# Patient Record
Sex: Female | Born: 1955 | Race: White | Hispanic: No | Smoking: Current every day smoker
Health system: Southern US, Community
[De-identification: ages and names within clinical notes are randomized; demographics above are authoritative.]

## PROBLEM LIST (undated history)

## (undated) DIAGNOSIS — F419 Anxiety disorder, unspecified: Secondary | ICD-10-CM

## (undated) DIAGNOSIS — IMO0001 Reserved for inherently not codable concepts without codable children: Secondary | ICD-10-CM

## (undated) DIAGNOSIS — M1990 Unspecified osteoarthritis, unspecified site: Secondary | ICD-10-CM

## (undated) DIAGNOSIS — R519 Headache, unspecified: Secondary | ICD-10-CM

## (undated) DIAGNOSIS — E785 Hyperlipidemia, unspecified: Secondary | ICD-10-CM

## (undated) DIAGNOSIS — R51 Headache: Secondary | ICD-10-CM

## (undated) DIAGNOSIS — G8929 Other chronic pain: Secondary | ICD-10-CM

## (undated) DIAGNOSIS — F329 Major depressive disorder, single episode, unspecified: Secondary | ICD-10-CM

## (undated) DIAGNOSIS — M797 Fibromyalgia: Secondary | ICD-10-CM

## (undated) DIAGNOSIS — Z9981 Dependence on supplemental oxygen: Secondary | ICD-10-CM

## (undated) DIAGNOSIS — J449 Chronic obstructive pulmonary disease, unspecified: Secondary | ICD-10-CM

## (undated) DIAGNOSIS — F410 Panic disorder [episodic paroxysmal anxiety] without agoraphobia: Secondary | ICD-10-CM

## (undated) DIAGNOSIS — K219 Gastro-esophageal reflux disease without esophagitis: Secondary | ICD-10-CM

## (undated) DIAGNOSIS — F32A Depression, unspecified: Secondary | ICD-10-CM

## (undated) DIAGNOSIS — G479 Sleep disorder, unspecified: Secondary | ICD-10-CM

## (undated) DIAGNOSIS — R600 Localized edema: Secondary | ICD-10-CM

## (undated) HISTORY — PX: ABDOMINAL SURGERY: SHX537

## (undated) HISTORY — PX: KNEE SURGERY: SHX244

## (undated) HISTORY — DX: Chronic obstructive pulmonary disease, unspecified: J44.9

## (undated) HISTORY — DX: Gastro-esophageal reflux disease without esophagitis: K21.9

## (undated) HISTORY — DX: Hyperlipidemia, unspecified: E78.5

## (undated) HISTORY — PX: TONSILLECTOMY: SUR1361

## (undated) HISTORY — PX: BACK SURGERY: SHX140

## (undated) HISTORY — PX: ANKLE SURGERY: SHX546

## (undated) HISTORY — PX: TUBAL LIGATION: SHX77

## (undated) HISTORY — PX: CARPAL TUNNEL RELEASE: SHX101

---

## 2004-08-28 ENCOUNTER — Emergency Department: Payer: Self-pay | Admitting: Emergency Medicine

## 2004-09-13 ENCOUNTER — Emergency Department: Payer: Self-pay | Admitting: Unknown Physician Specialty

## 2004-09-23 ENCOUNTER — Emergency Department: Payer: Self-pay | Admitting: Emergency Medicine

## 2005-11-10 ENCOUNTER — Other Ambulatory Visit: Payer: Self-pay

## 2005-11-16 ENCOUNTER — Ambulatory Visit: Payer: Self-pay | Admitting: Specialist

## 2005-12-10 ENCOUNTER — Ambulatory Visit: Payer: Self-pay | Admitting: Specialist

## 2006-09-14 ENCOUNTER — Ambulatory Visit: Payer: Self-pay | Admitting: Pain Medicine

## 2006-09-22 ENCOUNTER — Ambulatory Visit: Payer: Self-pay | Admitting: Pain Medicine

## 2006-10-07 ENCOUNTER — Emergency Department: Payer: Self-pay | Admitting: Emergency Medicine

## 2006-10-25 ENCOUNTER — Ambulatory Visit: Payer: Self-pay | Admitting: Family Medicine

## 2006-10-31 ENCOUNTER — Ambulatory Visit: Payer: Self-pay | Admitting: Pain Medicine

## 2006-11-10 ENCOUNTER — Ambulatory Visit: Payer: Self-pay | Admitting: Pain Medicine

## 2006-12-12 ENCOUNTER — Ambulatory Visit: Payer: Self-pay | Admitting: Physician Assistant

## 2007-01-02 ENCOUNTER — Ambulatory Visit: Payer: Self-pay | Admitting: Pain Medicine

## 2007-01-31 ENCOUNTER — Other Ambulatory Visit: Payer: Self-pay

## 2007-01-31 ENCOUNTER — Emergency Department: Payer: Self-pay | Admitting: Emergency Medicine

## 2007-06-15 ENCOUNTER — Emergency Department: Payer: Self-pay | Admitting: Emergency Medicine

## 2007-09-22 ENCOUNTER — Emergency Department: Payer: Self-pay | Admitting: Emergency Medicine

## 2008-01-27 ENCOUNTER — Emergency Department: Payer: Self-pay | Admitting: Emergency Medicine

## 2008-02-13 ENCOUNTER — Emergency Department: Payer: Self-pay | Admitting: Emergency Medicine

## 2009-01-25 ENCOUNTER — Emergency Department: Payer: Self-pay | Admitting: Emergency Medicine

## 2009-03-10 ENCOUNTER — Emergency Department (HOSPITAL_COMMUNITY): Admission: EM | Admit: 2009-03-10 | Discharge: 2009-03-10 | Payer: Self-pay | Admitting: Emergency Medicine

## 2009-03-10 ENCOUNTER — Ambulatory Visit: Payer: Self-pay | Admitting: *Deleted

## 2009-03-10 ENCOUNTER — Encounter (INDEPENDENT_AMBULATORY_CARE_PROVIDER_SITE_OTHER): Payer: Self-pay | Admitting: Emergency Medicine

## 2010-05-07 ENCOUNTER — Ambulatory Visit (HOSPITAL_COMMUNITY): Admission: RE | Admit: 2010-05-07 | Discharge: 2010-05-07 | Payer: Self-pay | Admitting: Family Medicine

## 2010-07-25 ENCOUNTER — Inpatient Hospital Stay (HOSPITAL_COMMUNITY)
Admission: EM | Admit: 2010-07-25 | Discharge: 2010-07-28 | Payer: Self-pay | Source: Home / Self Care | Admitting: Emergency Medicine

## 2010-07-25 ENCOUNTER — Ambulatory Visit: Payer: Self-pay | Admitting: Cardiology

## 2010-07-27 ENCOUNTER — Encounter (INDEPENDENT_AMBULATORY_CARE_PROVIDER_SITE_OTHER): Payer: Self-pay | Admitting: Internal Medicine

## 2010-11-10 LAB — HEPATIC FUNCTION PANEL
ALT: 17 U/L (ref 0–35)
AST: 20 U/L (ref 0–37)
Bilirubin, Direct: 0 mg/dL (ref 0.0–0.3)
Indirect Bilirubin: 0.4 mg/dL (ref 0.3–0.9)
Total Bilirubin: 0.4 mg/dL (ref 0.3–1.2)

## 2010-11-10 LAB — BLOOD GAS, ARTERIAL
Bicarbonate: 28.1 mEq/L — ABNORMAL HIGH (ref 20.0–24.0)
Bicarbonate: 28.4 mEq/L — ABNORMAL HIGH (ref 20.0–24.0)
O2 Saturation: 98.5 %
Patient temperature: 37
pH, Arterial: 7.326 — ABNORMAL LOW (ref 7.350–7.400)
pH, Arterial: 7.34 — ABNORMAL LOW (ref 7.350–7.400)

## 2010-11-10 LAB — GLUCOSE, CAPILLARY
Glucose-Capillary: 105 mg/dL — ABNORMAL HIGH (ref 70–99)
Glucose-Capillary: 122 mg/dL — ABNORMAL HIGH (ref 70–99)

## 2010-11-10 LAB — CARDIAC PANEL(CRET KIN+CKTOT+MB+TROPI)
CK, MB: 3.4 ng/mL (ref 0.3–4.0)
Troponin I: 0.01 ng/mL (ref 0.00–0.06)

## 2010-11-10 LAB — BASIC METABOLIC PANEL
BUN: 10 mg/dL (ref 6–23)
BUN: 8 mg/dL (ref 6–23)
CO2: 27 mEq/L (ref 19–32)
CO2: 33 mEq/L — ABNORMAL HIGH (ref 19–32)
Calcium: 8.7 mg/dL (ref 8.4–10.5)
Chloride: 99 mEq/L (ref 96–112)
Creatinine, Ser: 0.79 mg/dL (ref 0.4–1.2)
Creatinine, Ser: 0.8 mg/dL (ref 0.4–1.2)
GFR calc non Af Amer: >60 mL/min (ref >60)
Glucose, Bld: 132 mg/dL — ABNORMAL HIGH (ref 70–99)
Glucose, Bld: 161 mg/dL — ABNORMAL HIGH (ref 70–99)
Sodium: 143 mEq/L (ref 135–145)

## 2010-11-10 LAB — URINE CULTURE: Culture  Setup Time: 201111272046

## 2010-11-10 LAB — POCT CARDIAC MARKERS
CKMB, poc: <1 ng/mL — ABNORMAL LOW (ref 1.0–8.0)
Troponin i, poc: <0.05 ng/mL (ref 0.00–0.09)

## 2010-11-10 LAB — CBC
HCT: 45.5 % (ref 36.0–46.0)
Hemoglobin: 14.6 g/dL (ref 12.0–15.0)
Hemoglobin: 15.2 g/dL — ABNORMAL HIGH (ref 12.0–15.0)
MCH: 29.8 pg (ref 26.0–34.0)
MCHC: 32.8 g/dL (ref 30.0–36.0)
MCV: 91.5 fL (ref 78.0–100.0)
Platelets: 242 10*3/uL (ref 150–400)
Platelets: 245 10*3/uL (ref 150–400)
RBC: 4.97 MIL/uL (ref 3.87–5.11)
RDW: 14.7 % (ref 11.5–15.5)
WBC: 11.5 10*3/uL — ABNORMAL HIGH (ref 4.0–10.5)

## 2010-11-10 LAB — URINALYSIS, ROUTINE W REFLEX MICROSCOPIC
Hgb urine dipstick: NEGATIVE
Nitrite: NEGATIVE
Specific Gravity, Urine: 1.025 (ref 1.005–1.030)
Urobilinogen, UA: 0.2 mg/dL (ref 0.0–1.0)
pH: 6 (ref 5.0–8.0)

## 2010-11-10 LAB — DIFFERENTIAL
Basophils Absolute: 0 10*3/uL (ref 0.0–0.1)
Basophils Relative: 0 % (ref 0–1)
Eosinophils Absolute: 0 10*3/uL (ref 0.0–0.7)
Eosinophils Relative: 0 % (ref 0–5)
Lymphocytes Relative: 6 % — ABNORMAL LOW (ref 12–46)
Lymphs Abs: 0.7 10*3/uL (ref 0.7–4.0)
Monocytes Relative: 1 % — ABNORMAL LOW (ref 3–12)
Monocytes Relative: 5 % (ref 3–12)
Neutro Abs: 9.1 10*3/uL — ABNORMAL HIGH (ref 1.7–7.7)
Neutrophils Relative %: 81 % — ABNORMAL HIGH (ref 43–77)

## 2010-11-10 LAB — HEMOGLOBIN A1C: Hgb A1c MFr Bld: 5.8 % — ABNORMAL HIGH (ref <5.7)

## 2010-12-06 LAB — DIFFERENTIAL
Basophils Relative: 1 % (ref 0–1)
Eosinophils Absolute: 0.2 10*3/uL (ref 0.0–0.7)
Eosinophils Relative: 2 % (ref 0–5)
Lymphs Abs: 2.3 10*3/uL (ref 0.7–4.0)
Neutrophils Relative %: 65 % (ref 43–77)

## 2010-12-06 LAB — BLOOD GAS, ARTERIAL
Acid-Base Excess: 7.5 mmol/L — ABNORMAL HIGH (ref 0.0–2.0)
Bicarbonate: 33.6 mEq/L — ABNORMAL HIGH (ref 20.0–24.0)
O2 Saturation: 96.8 %
Patient temperature: 98.6
TCO2: 30 mmol/L (ref 0–100)
pH, Arterial: 7.399 (ref 7.350–7.400)

## 2010-12-06 LAB — BASIC METABOLIC PANEL
BUN: 8 mg/dL (ref 6–23)
CO2: 34 mEq/L — ABNORMAL HIGH (ref 19–32)
Chloride: 100 mEq/L (ref 96–112)
Creatinine, Ser: 0.76 mg/dL (ref 0.4–1.2)
Glucose, Bld: 92 mg/dL (ref 70–99)
Potassium: 3.7 mEq/L (ref 3.5–5.1)

## 2010-12-06 LAB — CBC
HCT: 40.7 % (ref 36.0–46.0)
MCHC: 32.1 g/dL (ref 30.0–36.0)
MCV: 82.6 fL (ref 78.0–100.0)
Platelets: 279 10*3/uL (ref 150–400)
RDW: 16.7 % — ABNORMAL HIGH (ref 11.5–15.5)
WBC: 9.2 10*3/uL (ref 4.0–10.5)

## 2010-12-06 LAB — D-DIMER, QUANTITATIVE: D-Dimer, Quant: 0.94 ug/mL-FEU — ABNORMAL HIGH (ref 0.00–0.48)

## 2010-12-06 LAB — PROTIME-INR: Prothrombin Time: 12.8 seconds (ref 11.6–15.2)

## 2010-12-18 ENCOUNTER — Encounter (INDEPENDENT_AMBULATORY_CARE_PROVIDER_SITE_OTHER): Payer: Self-pay

## 2010-12-18 DIAGNOSIS — R0989 Other specified symptoms and signs involving the circulatory and respiratory systems: Secondary | ICD-10-CM

## 2011-04-13 ENCOUNTER — Institutional Professional Consult (permissible substitution): Payer: Self-pay | Admitting: Internal Medicine

## 2011-05-03 ENCOUNTER — Emergency Department (HOSPITAL_COMMUNITY)
Admission: EM | Admit: 2011-05-03 | Discharge: 2011-05-03 | Discharge status: Against Medical Advice | Payer: Medicare Other | Attending: Emergency Medicine | Admitting: Emergency Medicine

## 2011-05-03 DIAGNOSIS — Z5320 Procedure and treatment not carried out because of patient's decision for unspecified reasons: Secondary | ICD-10-CM | POA: Insufficient documentation

## 2011-05-03 DIAGNOSIS — M7989 Other specified soft tissue disorders: Secondary | ICD-10-CM | POA: Insufficient documentation

## 2011-05-03 HISTORY — DX: Other chronic pain: G89.29

## 2011-05-03 HISTORY — DX: Panic disorder (episodic paroxysmal anxiety): F41.0

## 2011-05-03 HISTORY — DX: Sleep disorder, unspecified: G47.9

## 2011-05-03 HISTORY — DX: Fibromyalgia: M79.7

## 2011-05-03 HISTORY — DX: Major depressive disorder, single episode, unspecified: F32.9

## 2011-05-03 HISTORY — DX: Anxiety disorder, unspecified: F41.9

## 2011-05-03 HISTORY — DX: Depression, unspecified: F32.A

## 2011-05-03 HISTORY — DX: Unspecified osteoarthritis, unspecified site: M19.90

## 2011-05-03 MED ORDER — ALBUTEROL (5 MG/ML) CONTINUOUS INHALATION SOLN: 5.0000 mg/h | INHALATION_SOLUTION | Freq: Once | RESPIRATORY_TRACT | Status: DC

## 2011-05-03 MED ORDER — KETOROLAC TROMETHAMINE 60 MG/2ML IM SOLN: 60.0000 mg | Freq: Once | INTRAMUSCULAR | Status: DC

## 2011-05-03 MED ORDER — IPRATROPIUM BROMIDE 0.02 % IN SOLN: 0.5000 mg | Freq: Once | RESPIRATORY_TRACT | Status: DC

## 2011-05-03 MED ORDER — PREDNISONE 20 MG PO TABS: 40.0000 mg | ORAL_TABLET | Freq: Once | ORAL | Status: DC

## 2011-05-03 NOTE — ED Notes (Signed)
Witnessed drinking soda in lobby and was on sidewalk smoking.

## 2011-05-03 NOTE — ED Notes (Signed)
Pt reports bil lower leg swelling x2 days

## 2011-05-03 NOTE — ED Provider Notes (Addendum)
Scribed for Dr. Lynelle Doctor, the patient was seen in room 03. This chart was scribed by Hillery Hunter. This patient's care was started at 21:42.   History    CSN: [id number] Arrival date & time: 05/03/2011  8:51 PM  Chief Complaint  Patient presents with  . Leg Swelling    x 2 days   The history is provided by the patient.    Bethany Villa is a 55 y.o. female who presents to the Emergency Department complaining of lower leg swelling over the last two days without improvement. She denies chest pain but has some shortness of breath which she states is worse over the last couple days just present when she walks. She has an inhaler at home for when she is wheezing but this has not been improving dyspnea. She has had some cough with white sputum. She denies fevers. She smokes two packs per day currently. PT relates she has chronic swelling of her left leg for years after ankle fx and knee surgery for torn cartilege.  She reports a history of fibromyalgia, asthma, denies DM, HTN. She is followed by Dr. Tania Ade in [facility]. She reports the last time she needed Prednisone for asthma was about six months ago.  She reports her current home medications: Albuterol, Singulair, Flexeril, Valium, Voltaren, Cymbalta, Lortab, Lyrica, Spiriva.  Past Medical History  Diagnosis Date  . Fibromyalgia   . Asthma   . DJD (degenerative joint disease)   . Chronic pain   . Depression   . Anxiety   . Panic attack   . Sleep disorder     Past Surgical History  Procedure Date  . Abdominal surgery   . Back surgery   . Carpal tunnel release   . Ankle surgery   . Knee surgery   . Cesarean section   . Tonsillectomy   . Tubal ligation     No family history on file.  History  Substance Use Topics  . Smoking status: Current Everyday Smoker    Types: Cigarettes  . Smokeless tobacco: Not on file  . Alcohol Use: Yes     occ  smokes 2ppd, on disability  Review of Systems    Constitutional: Negative for fever.  Respiratory: Positive for cough and shortness of breath.   Gastrointestinal: Negative for nausea, vomiting, abdominal pain and diarrhea.  Neurological: Negative for speech difficulty, weakness, light-headedness and numbness.  Psychiatric/Behavioral: Negative for confusion.  All other systems reviewed and are negative.    Physical Exam  BP 126/80  Pulse 106  Temp(Src) 97.8 F (36.6 C) (Oral)  Resp 22  Ht 5\' 4"  (1.626 m)  Wt 200 lb (90.719 kg)  BMI 34.33 kg/m2  SpO2 94%  Physical Exam  Nursing note and vitals reviewed. Constitutional: She is oriented to person, place, and time. She appears well-developed and well-nourished.       obese  HENT:  Head: Normocephalic and atraumatic.  Eyes: EOM are normal. Pupils are equal, round, and reactive to light.  Neck: Normal range of motion. Neck supple.  Cardiovascular: Normal rate, regular rhythm and normal heart sounds.   Pulmonary/Chest: She has wheezes.       Very diminished breath sounds  Musculoskeletal: Normal range of motion. She exhibits no tenderness.  Neurological: She is alert and oriented to person, place, and time.  Skin: Skin is warm and dry. There is erythema (chronic red changes of the dorsum of her left foot at the base of her toes c/w chronic  venous stasis).  Psychiatric: She has a normal mood and affect. Her behavior is normal.  No pitting edema of her LE, no obvious swelling of her RLE, has mild swelling nonpitting of her LLE with diffuse pain both anterior and posteriorly with some redness of the skin of her distal foot that she states is old. Her leg/foot is not warm to touch.   ED Course  Procedures  OTHER DATA REVIEWED: Nursing notes, vital signs, and past medical records reviewed.  DIAGNOSTIC STUDIES: Oxygen Saturation is 94% on room air, adequate for patient by my interpretation.    ED COURSE / COORDINATION OF CARE: 21:54. Initial orders: CBC, D-dimer quant, BNP, BMP,  CXR. 22:00. Ordered Toradol 60mg , Prednisone 40mg , Albuterol / Atrovent nebulizer. 22:07. Patient decided to leave AMA without blood taken or medication administered.   MDM: PLAN:  Patient left AMA  I personally performed the services described in this documentation, which was scribed in my presence. The recorded information has been reviewed and considered.Devoria Albe, MD, FACEP  Ward Givens, MD 05/03/11 [id number]  Ward Givens, MD 05/03/11 2216

## 2011-05-19 ENCOUNTER — Emergency Department (HOSPITAL_COMMUNITY): Payer: Medicare Other

## 2011-05-19 ENCOUNTER — Inpatient Hospital Stay (HOSPITAL_COMMUNITY)
Admission: EM | Admit: 2011-05-19 | Discharge: 2011-05-23 | DRG: 189 | Disposition: A | Discharge status: Home / Self Care | Payer: Medicare Other | Attending: Internal Medicine | Admitting: Internal Medicine

## 2011-05-19 ENCOUNTER — Encounter (HOSPITAL_COMMUNITY): Payer: Self-pay | Admitting: Emergency Medicine

## 2011-05-19 DIAGNOSIS — G894 Chronic pain syndrome: Secondary | ICD-10-CM | POA: Diagnosis present

## 2011-05-19 DIAGNOSIS — G8929 Other chronic pain: Secondary | ICD-10-CM | POA: Diagnosis present

## 2011-05-19 DIAGNOSIS — J9602 Acute respiratory failure with hypercapnia: Secondary | ICD-10-CM | POA: Diagnosis present

## 2011-05-19 DIAGNOSIS — IMO0001 Reserved for inherently not codable concepts without codable children: Secondary | ICD-10-CM | POA: Diagnosis present

## 2011-05-19 DIAGNOSIS — E872 Acidosis, unspecified: Secondary | ICD-10-CM | POA: Diagnosis present

## 2011-05-19 DIAGNOSIS — E669 Obesity, unspecified: Secondary | ICD-10-CM | POA: Diagnosis present

## 2011-05-19 DIAGNOSIS — J45901 Unspecified asthma with (acute) exacerbation: Secondary | ICD-10-CM | POA: Diagnosis present

## 2011-05-19 DIAGNOSIS — F3289 Other specified depressive episodes: Secondary | ICD-10-CM | POA: Diagnosis present

## 2011-05-19 DIAGNOSIS — F329 Major depressive disorder, single episode, unspecified: Secondary | ICD-10-CM | POA: Diagnosis present

## 2011-05-19 DIAGNOSIS — F411 Generalized anxiety disorder: Secondary | ICD-10-CM | POA: Diagnosis present

## 2011-05-19 DIAGNOSIS — J9600 Acute respiratory failure, unspecified whether with hypoxia or hypercapnia: Principal | ICD-10-CM | POA: Diagnosis present

## 2011-05-19 DIAGNOSIS — R739 Hyperglycemia, unspecified: Secondary | ICD-10-CM | POA: Diagnosis present

## 2011-05-19 DIAGNOSIS — J441 Chronic obstructive pulmonary disease with (acute) exacerbation: Secondary | ICD-10-CM | POA: Diagnosis present

## 2011-05-19 DIAGNOSIS — F17200 Nicotine dependence, unspecified, uncomplicated: Secondary | ICD-10-CM | POA: Diagnosis present

## 2011-05-19 LAB — CBC
HCT: 49.9 % — ABNORMAL HIGH (ref 36.0–46.0)
Hemoglobin: 15.6 g/dL — ABNORMAL HIGH (ref 12.0–15.0)
MCH: 28.6 pg (ref 26.0–34.0)
MCV: 91.6 fL (ref 78.0–100.0)
RBC: 5.45 MIL/uL — ABNORMAL HIGH (ref 3.87–5.11)

## 2011-05-19 LAB — HEPATIC FUNCTION PANEL
ALT: 9 U/L (ref 0–35)
AST: 13 U/L (ref 0–37)
Bilirubin, Direct: 0.1 mg/dL (ref 0.0–0.3)
Total Bilirubin: 0.2 mg/dL — ABNORMAL LOW (ref 0.3–1.2)
Total Protein: 6.8 g/dL (ref 6.0–8.3)

## 2011-05-19 LAB — BLOOD GAS, ARTERIAL
Bicarbonate: 32.6 mEq/L — ABNORMAL HIGH (ref 20.0–24.0)
O2 Saturation: 88 %
pO2, Arterial: 57.2 mmHg — ABNORMAL LOW (ref 80.0–100.0)

## 2011-05-19 LAB — BASIC METABOLIC PANEL
CO2: 35 mEq/L — ABNORMAL HIGH (ref 19–32)
Calcium: 9 mg/dL (ref 8.4–10.5)
Glucose, Bld: 159 mg/dL — ABNORMAL HIGH (ref 70–99)
Sodium: 139 mEq/L (ref 135–145)

## 2011-05-19 MED ORDER — NICOTINE 21 MG/24HR TD PT24
21.0000 mg | MEDICATED_PATCH | Freq: Every day | TRANSDERMAL | Status: DC
  Administered 2011-05-19 – 2011-05-23 (×5): 21 mg via TRANSDERMAL
  Filled 2011-05-19 (×5): qty 1

## 2011-05-19 MED ORDER — OXYCODONE HCL 5 MG PO TABS
5.0000 mg | ORAL_TABLET | ORAL | Status: DC | PRN
  Administered 2011-05-20 – 2011-05-22 (×5): 5 mg via ORAL
  Filled 2011-05-19 (×5): qty 1

## 2011-05-19 MED ORDER — IPRATROPIUM BROMIDE 0.02 % IN SOLN
0.5000 mg | Freq: Once | RESPIRATORY_TRACT | Status: AC
  Administered 2011-05-19: 0.5 mg via RESPIRATORY_TRACT
  Filled 2011-05-19 (×2): qty 2.5

## 2011-05-19 MED ORDER — HYDROMORPHONE HCL 1 MG/ML IJ SOLN
0.5000 mg | INTRAMUSCULAR | Status: DC | PRN
  Administered 2011-05-19 – 2011-05-22 (×8): 0.5 mg via INTRAVENOUS
  Filled 2011-05-19 (×10): qty 1

## 2011-05-19 MED ORDER — ENOXAPARIN SODIUM 40 MG/0.4ML ~~LOC~~ SOLN
40.0000 mg | SUBCUTANEOUS | Status: DC
  Administered 2011-05-20 – 2011-05-23 (×4): 40 mg via SUBCUTANEOUS
  Filled 2011-05-19 (×4): qty 0.4

## 2011-05-19 MED ORDER — POLYETHYLENE GLYCOL 3350 17 G PO PACK
17.0000 g | PACK | Freq: Every day | ORAL | Status: DC | PRN
  Administered 2011-05-20: 17 g via ORAL
  Filled 2011-05-19: qty 1

## 2011-05-19 MED ORDER — IPRATROPIUM BROMIDE 0.02 % IN SOLN
0.5000 mg | RESPIRATORY_TRACT | Status: DC | PRN
  Filled 2011-05-19: qty 2.5

## 2011-05-19 MED ORDER — ACETAMINOPHEN 650 MG RE SUPP: 650.0000 mg | Freq: Four times a day (QID) | RECTAL | Status: DC | PRN

## 2011-05-19 MED ORDER — PREGABALIN 75 MG PO CAPS
150.0000 mg | ORAL_CAPSULE | Freq: Two times a day (BID) | ORAL | Status: DC
  Administered 2011-05-19 – 2011-05-23 (×8): 150 mg via ORAL
  Filled 2011-05-19 (×2): qty 2
  Filled 2011-05-19: qty 1
  Filled 2011-05-19: qty 2
  Filled 2011-05-19: qty 1
  Filled 2011-05-19 (×4): qty 2

## 2011-05-19 MED ORDER — FLUTICASONE PROPIONATE 50 MCG/ACT NA SUSP
2.0000 | Freq: Every day | NASAL | Status: DC
  Administered 2011-05-20 – 2011-05-23 (×4): 2 via NASAL
  Filled 2011-05-19: qty 320000

## 2011-05-19 MED ORDER — BISACODYL 10 MG RE SUPP: 10.0000 mg | RECTAL | Status: DC | PRN

## 2011-05-19 MED ORDER — SODIUM CHLORIDE 0.9 % IV SOLN: INTRAVENOUS | Status: DC

## 2011-05-19 MED ORDER — ALBUTEROL SULFATE (5 MG/ML) 0.5% IN NEBU
2.5000 mg | INHALATION_SOLUTION | RESPIRATORY_TRACT | Status: DC
  Administered 2011-05-19 – 2011-05-22 (×18): 2.5 mg via RESPIRATORY_TRACT
  Filled 2011-05-19 (×19): qty 0.5

## 2011-05-19 MED ORDER — ALBUTEROL SULFATE (5 MG/ML) 0.5% IN NEBU
2.5000 mg | INHALATION_SOLUTION | RESPIRATORY_TRACT | Status: DC | PRN
  Filled 2011-05-19: qty 0.5

## 2011-05-19 MED ORDER — ONDANSETRON HCL 4 MG/2ML IJ SOLN: 4.0000 mg | Freq: Four times a day (QID) | INTRAMUSCULAR | Status: DC | PRN

## 2011-05-19 MED ORDER — ONDANSETRON HCL 4 MG PO TABS: 4.0000 mg | ORAL_TABLET | Freq: Four times a day (QID) | ORAL | Status: DC | PRN

## 2011-05-19 MED ORDER — FLEET ENEMA 7-19 GM/118ML RE ENEM: 1.0000 | ENEMA | RECTAL | Status: DC | PRN

## 2011-05-19 MED ORDER — METHYLPREDNISOLONE SODIUM SUCC 125 MG IJ SOLR
125.0000 mg | Freq: Once | INTRAMUSCULAR | Status: AC
  Administered 2011-05-19: 125 mg via INTRAVENOUS
  Filled 2011-05-19: qty 2

## 2011-05-19 MED ORDER — DICLOFENAC SODIUM 1 % TD GEL
1.0000 "application " | Freq: Four times a day (QID) | TRANSDERMAL | Status: DC | PRN
  Filled 2011-05-19: qty 100

## 2011-05-19 MED ORDER — ALBUTEROL SULFATE (5 MG/ML) 0.5% IN NEBU
2.5000 mg | INHALATION_SOLUTION | Freq: Once | RESPIRATORY_TRACT | Status: AC
  Administered 2011-05-19: 2.5 mg via RESPIRATORY_TRACT
  Filled 2011-05-19: qty 0.5
  Filled 2011-05-19: qty 1

## 2011-05-19 MED ORDER — METHYLPREDNISOLONE SODIUM SUCC 125 MG IJ SOLR
125.0000 mg | Freq: Four times a day (QID) | INTRAMUSCULAR | Status: DC
  Administered 2011-05-19 – 2011-05-21 (×6): 125 mg via INTRAVENOUS
  Filled 2011-05-19 (×6): qty 2

## 2011-05-19 MED ORDER — PROMETHAZINE HCL 12.5 MG PO TABS: 12.5000 mg | ORAL_TABLET | Freq: Four times a day (QID) | ORAL | Status: DC | PRN

## 2011-05-19 MED ORDER — ALBUTEROL (5 MG/ML) CONTINUOUS INHALATION SOLN
10.0000 mg | INHALATION_SOLUTION | Freq: Once | RESPIRATORY_TRACT | Status: AC
  Administered 2011-05-19: 10 mg via RESPIRATORY_TRACT

## 2011-05-19 MED ORDER — BENZONATATE 100 MG PO CAPS
200.0000 mg | ORAL_CAPSULE | Freq: Three times a day (TID) | ORAL | Status: DC | PRN
  Administered 2011-05-19 – 2011-05-22 (×4): 200 mg via ORAL
  Filled 2011-05-19 (×4): qty 2

## 2011-05-19 MED ORDER — ALBUTEROL (5 MG/ML) CONTINUOUS INHALATION SOLN
INHALATION_SOLUTION | RESPIRATORY_TRACT | Status: AC
  Filled 2011-05-19: qty 20

## 2011-05-19 MED ORDER — CYCLOBENZAPRINE HCL 10 MG PO TABS
10.0000 mg | ORAL_TABLET | Freq: Three times a day (TID) | ORAL | Status: DC | PRN
  Administered 2011-05-20 – 2011-05-23 (×4): 10 mg via ORAL
  Filled 2011-05-19 (×4): qty 1

## 2011-05-19 MED ORDER — ALBUTEROL SULFATE (5 MG/ML) 0.5% IN NEBU
2.5000 mg | INHALATION_SOLUTION | Freq: Once | RESPIRATORY_TRACT | Status: AC
  Administered 2011-05-19: 2.5 mg via RESPIRATORY_TRACT
  Filled 2011-05-19: qty 1

## 2011-05-19 MED ORDER — PROMETHAZINE HCL 25 MG/ML IJ SOLN: 12.5000 mg | Freq: Four times a day (QID) | INTRAMUSCULAR | Status: DC | PRN

## 2011-05-19 MED ORDER — SODIUM CHLORIDE 0.9 % IV SOLN
INTRAVENOUS | Status: DC
  Administered 2011-05-21: 06:00:00 via INTRAVENOUS

## 2011-05-19 MED ORDER — CHLORHEXIDINE GLUCONATE 0.12 % MT SOLN
15.0000 mL | Freq: Two times a day (BID) | OROMUCOSAL | Status: DC
  Administered 2011-05-19 – 2011-05-23 (×8): 15 mL via OROMUCOSAL
  Filled 2011-05-19 (×9): qty 15

## 2011-05-19 MED ORDER — OLOPATADINE HCL 0.1 % OP SOLN
1.0000 [drp] | Freq: Two times a day (BID) | OPHTHALMIC | Status: DC
  Administered 2011-05-20 – 2011-05-23 (×7): 1 [drp] via OPHTHALMIC
  Filled 2011-05-19: qty 5

## 2011-05-19 MED ORDER — AZITHROMYCIN 500 MG IV SOLR
500.0000 mg | INTRAVENOUS | Status: DC
  Administered 2011-05-20 – 2011-05-22 (×4): 500 mg via INTRAVENOUS
  Filled 2011-05-19 (×5): qty 500

## 2011-05-19 MED ORDER — BIOTENE DRY MOUTH MT LIQD
Freq: Two times a day (BID) | OROMUCOSAL | Status: DC
  Administered 2011-05-20 – 2011-05-23 (×4): via OROMUCOSAL

## 2011-05-19 MED ORDER — GUAIFENESIN ER 600 MG PO TB12
1200.0000 mg | ORAL_TABLET | Freq: Two times a day (BID) | ORAL | Status: DC
  Administered 2011-05-19 – 2011-05-23 (×8): 1200 mg via ORAL
  Filled 2011-05-19 (×8): qty 2

## 2011-05-19 MED ORDER — TRAZODONE HCL 50 MG PO TABS
25.0000 mg | ORAL_TABLET | Freq: Every evening | ORAL | Status: DC | PRN
  Administered 2011-05-21 – 2011-05-22 (×2): 25 mg via ORAL
  Filled 2011-05-19 (×2): qty 1

## 2011-05-19 MED ORDER — POTASSIUM CHLORIDE IN NACL 20-0.9 MEQ/L-% IV SOLN
INTRAVENOUS | Status: DC
  Administered 2011-05-19: via INTRAVENOUS

## 2011-05-19 MED ORDER — HYDROCODONE-ACETAMINOPHEN 5-325 MG PO TABS
1.0000 | ORAL_TABLET | Freq: Once | ORAL | Status: AC
  Administered 2011-05-19: 1 via ORAL
  Filled 2011-05-19: qty 1

## 2011-05-19 MED ORDER — IPRATROPIUM BROMIDE 0.02 % IN SOLN
0.5000 mg | Freq: Once | RESPIRATORY_TRACT | Status: AC
  Administered 2011-05-19: 0.5 mg via RESPIRATORY_TRACT
  Filled 2011-05-19: qty 2.5

## 2011-05-19 MED ORDER — INFLUENZA VIRUS VACC SPLIT PF IM SUSP
0.5000 mL | Freq: Once | INTRAMUSCULAR | Status: AC
  Administered 2011-05-20: 0.5 mL via INTRAMUSCULAR
  Filled 2011-05-19: qty 0.5

## 2011-05-19 MED ORDER — FOLIC ACID 1 MG PO TABS
1.0000 mg | ORAL_TABLET | Freq: Every day | ORAL | Status: DC
  Administered 2011-05-20 – 2011-05-23 (×4): 1 mg via ORAL
  Filled 2011-05-19 (×4): qty 1

## 2011-05-19 MED ORDER — MONTELUKAST SODIUM 10 MG PO TABS
10.0000 mg | ORAL_TABLET | Freq: Every day | ORAL | Status: DC
  Administered 2011-05-20 – 2011-05-23 (×4): 10 mg via ORAL
  Filled 2011-05-19 (×4): qty 1

## 2011-05-19 MED ORDER — DIAZEPAM 5 MG PO TABS
5.0000 mg | ORAL_TABLET | Freq: Two times a day (BID) | ORAL | Status: DC | PRN
  Administered 2011-05-21 – 2011-05-22 (×3): 5 mg via ORAL
  Filled 2011-05-19 (×3): qty 1

## 2011-05-19 MED ORDER — INSULIN ASPART 100 UNIT/ML ~~LOC~~ SOLN
0.0000 [IU] | Freq: Three times a day (TID) | SUBCUTANEOUS | Status: DC
  Administered 2011-05-20: 1 [IU] via SUBCUTANEOUS
  Administered 2011-05-20 – 2011-05-21 (×3): 2 [IU] via SUBCUTANEOUS
  Administered 2011-05-21 – 2011-05-22 (×3): 1 [IU] via SUBCUTANEOUS
  Filled 2011-05-19: qty 3

## 2011-05-19 MED ORDER — INSULIN ASPART 100 UNIT/ML ~~LOC~~ SOLN: 0.0000 [IU] | Freq: Every day | SUBCUTANEOUS | Status: DC

## 2011-05-19 MED ORDER — ACETAMINOPHEN 325 MG PO TABS
650.0000 mg | ORAL_TABLET | Freq: Four times a day (QID) | ORAL | Status: DC | PRN
  Administered 2011-05-21 – 2011-05-22 (×6): 650 mg via ORAL
  Filled 2011-05-19 (×6): qty 2

## 2011-05-19 MED ORDER — IPRATROPIUM BROMIDE 0.02 % IN SOLN
0.5000 mg | RESPIRATORY_TRACT | Status: DC
  Administered 2011-05-19 – 2011-05-22 (×18): 0.5 mg via RESPIRATORY_TRACT
  Filled 2011-05-19 (×19): qty 2.5

## 2011-05-19 MED ORDER — SODIUM CHLORIDE 0.9 % IV BOLUS (SEPSIS)
250.0000 mL | Freq: Once | INTRAVENOUS | Status: AC
  Administered 2011-05-19: 500 mL via INTRAVENOUS

## 2011-05-19 NOTE — ED Notes (Signed)
Pt provided a drink of grape juice at this time.

## 2011-05-19 NOTE — ED Notes (Signed)
Iv started. Breathing tx finished. Meal tray given per request.

## 2011-05-19 NOTE — ED Notes (Signed)
CRITICAL VALUE ALERT  Critical value received:  Ph 7.277, co2 72.1, po2 57.2, so2, 88, bicarb 32.6  Date of notification:  05/19/11  Time of notification:  1925  Critical value read back:yes  Nurse who received alert:  Thornton Dales, RN  MD notified (1st page):  [email or web address]  Time of first page:  1930  MD notified (2nd page):  Time of second page:  Responding MD:  zackowski  Time MD responded:  [phone]

## 2011-05-19 NOTE — ED Notes (Signed)
Respiratory paged for a bipap at this time.

## 2011-05-19 NOTE — ED Notes (Signed)
Pt c/o sob and cough with green phlegm x 1 wk.  Pt c/o headache and pain to ribs area "because I cough so hard" x 1 week also. Pt is alert/oriented.  Pt has audible wheezing and wheezing throughout. nad at this time. 91% RA.

## 2011-05-19 NOTE — ED Notes (Signed)
Pt resting calmly w/ eyes closed. Rise & fall of the chest noted. Pt remains on bipap & cardiac monitor.  Bed in low position, side rails up x2. NAD noted.

## 2011-05-19 NOTE — H&P (Signed)
PCP:   Monica Becton, MD   Chief Complaint:  SOB and cough x 1 week  HPI: 54-yo obese CF with h/o asthma x 23 years,and chronic tobacco abuse, presents with worseing SOB and cough not resolved by home nebulization. Eventually because of worsening symptoms and rib pains form coughing, Ms Pair came to ED for assistance. Her wheezing was helped by serial nebulizations in the ED but arterial blood gases after nebulization still show severe hypercapnic hypoxia. Patient had dramatic relieve with BIPAP and hospitalist was called to assist with management.  Pt denies fever and cough is productive of green sputum; continues to smoke 2 packs per day, does not use home oxygen. She does admit using hydrocodone more frequently for the persistent coughing.   Review of Systems:  The patient denies anorexia, fever, weight loss,, vision loss, decreased hearing, hoarseness, syncope, peripheral edema, balance deficits, hemoptysis, abdominal pain, melena, hematochezia, severe indigestion/heartburn, hematuria, incontinence, genital sores, muscle weakness, suspicious skin lesions, transient blindness, difficulty walking, depression, unusual weight change, abnormal bleeding, enlarged lymph nodes, angioedema, and breast masses.  Past Medical History: Past Medical History  Diagnosis Date  . Fibromyalgia   . Asthma   . DJD (degenerative joint disease)   . Chronic pain   . Depression   . Anxiety   . Panic attack   . Sleep disorder    Past Surgical History  Procedure Date  . Abdominal surgery   . Back surgery   . Carpal tunnel release   . Ankle surgery   . Knee surgery   . Cesarean section   . Tonsillectomy   . Tubal ligation     Medications: Prior to Admission medications   Medication Sig Start Date End Date Taking? Authorizing Provider  albuterol (PROAIR HFA) 108 (90 BASE) MCG/ACT inhaler Inhale 2 puffs into the lungs every 6 (six) hours as needed.     Yes Historical Provider, MD  albuterol  (PROVENTIL) (2.5 MG/3ML) 0.083% nebulizer solution Take 2.5 mg by nebulization every 6 (six) hours as needed. For shortness of breath   Yes Historical Provider, MD  cyclobenzaprine (FLEXERIL) 10 MG tablet Take 10 mg by mouth as needed. Up to twice daily for sleep and muscle relaxation    Yes Historical Provider, MD  diazepam (VALIUM) 5 MG tablet Take 5 mg by mouth 2 (two) times daily as needed. For nerves   Yes Historical Provider, MD  diclofenac sodium (VOLTAREN) 1 % GEL Apply 1 application topically 4 (four) times daily as needed. To knee for pain   Yes Historical Provider, MD  fluticasone (FLONASE) 50 MCG/ACT nasal spray Place 2 sprays into the nose daily.     Yes Historical Provider, MD  guaiFENesin-codeine (ROBITUSSIN AC) 100-10 MG/5ML syrup Take 5 mLs by mouth 4 (four) times daily as needed. For cough     Yes Historical Provider, MD  HYDROcodone-acetaminophen (LORTAB) 10-500 MG per tablet Take 1 tablet by mouth every 6 (six) hours as needed. For pain   Yes Historical Provider, MD  lidocaine (LIDODERM) 5 % Place 1 patch onto the skin daily. As needed for pain: Remove & Discard patch within 12 hours or as directed by MD   Yes Historical Provider, MD  montelukast (SINGULAIR) 10 MG tablet Take 10 mg by mouth daily.    Yes Historical Provider, MD  olopatadine (PATANOL) 0.1 % ophthalmic solution Place 1 drop into both eyes 2 (two) times daily.     Yes Historical Provider, MD  pregabalin (LYRICA) 150 MG capsule Take  150 mg by mouth 2 (two) times daily.     Yes Historical Provider, MD  tiotropium (SPIRIVA) 18 MCG inhalation capsule Place 18 mcg into inhaler and inhale every morning.    Yes Historical Provider, MD  DULoxetine HCl (CYMBALTA PO) Take 1 capsule by mouth daily.      Historical Provider, MD  Pregabalin (LYRICA PO) Take 1 capsule by mouth 2 (two) times daily.      Historical Provider, MD  PRESCRIPTION MEDICATION Place 1 spray into the nose daily. PRESCRIPTION NASAL SPRAY     Historical  Provider, MD  PRESCRIPTION MEDICATION Place 2 drops into both ears daily.      Historical Provider, MD    Allergies:   Allergies  Allergen Reactions  . Penicillins Shortness Of Breath and Swelling  . Sulfa Antibiotics Shortness Of Breath and Swelling    Social History:  reports that she has been smoking Cigarettes.  She does not have any smokeless tobacco history on file. She reports that she drinks alcohol. She reports that she does not use illicit drugs.  Family History: Family History  Problem Relation Age of Onset  . Cancer Mother     beast  does not know any other details of  family medical history.   Physical Exam: Filed Vitals:   05/19/11 1921 05/19/11 2011 05/19/11 2129 05/19/11 2131  BP: 135/80  122/74 122/74  Pulse: 104 103 96 95  Temp:      TempSrc:      Resp: 24 22  18   SpO2: 91% 94%  94%   General appearance: alert, cooperative and morbidly obese Head: Normocephalic, without obvious abnormality, atraumatic Eyes: conjunctivae/corneas clear. Granuloma/tumor lower medial aspect of right  palpebral fissure; PERL Throat: no obvious abnormality; wearing oronasal BIPAP mask Neck: short, thick neck; no adenopathy, no carotid bruit, no JVD, supple, symmetrical, trachea midline and thyroid not enlarged, symmetric, no tenderness/mass/nodules Back: symmetric, no curvature. ROM normal. No CVA tenderness. Resp: prolonged expiration and wheezing bilaterally Chest wall:  Tenderness bilateral lower ribs anterior abdominal wall muscles. Cardio: regular rate and rhythm, S1, S2 normal, no murmur, click, rub or gallop GI: obese soft, tenderness of abdominal wall muscles; bowel sounds normal; no masses,  no organomegaly Extremities: extremities normal, atraumatic, no cyanosis or edema Skin: Skin color, texture, turgor normal. No rashes or lesions Neurologic: Grossly normal   Labs on Admission:   ABG: pH 7.27    PCO2 72   PO2 57    Sat% 88 on 2L/min  Basename 05/19/11 1742    NA 139  K 3.6  CL 98  CO2 35*  GLUCOSE 159*  BUN 5*  CREATININE 0.59  CALCIUM 9.0  MG --  PHOS --      Basename 05/19/11 1742  WBC 12.3*  NEUTROABS --  HGB 15.6*  HCT 49.9*  MCV 91.6  PLT 209     Radiological Exams on Admission: Dg Chest Portable 1 View  05/19/2011  *RADIOLOGY REPORT*  Clinical Data: Short of breath.  Wheezing.  PORTABLE CHEST - 1 VIEW  Comparison: 07/25/2010  Findings: Moderate cardiomegaly.  Lungs are clear.  No pneumothorax.  No pleural effusion.  IMPRESSION: Cardiomegaly without edema.  Original Report Authenticated By: Donavan Burnet, M.D.    Assessment/Plan Present on Admission:  Acute Hypercapnic Hypoxic Respiratory Failure .Asthma with COPD with exacerbation .Tobacco abuse .Chronic pain disorder .Obesity .Hyperglycemia  Will admit for BIPAP and supplemental oxygen, until narcotic effects lessen; Serial albuterol and Atrovent  By nebulizer; Zithromax  coverage and IV steroid Nicotine patch and tobacco cessation counselling continued chronic pain management Check HbAIC, Accu-cheks and SSI coverage in view of Steroid therapy. Glucose seems to have been elavated for many months, but not normally in the diabetic range. Dietary counseling.  Other plans as per orders.   Willey Due 05/19/2011, 10:35 PM

## 2011-05-19 NOTE — ED Provider Notes (Signed)
History     CSN: [id number] Arrival date & time: 05/19/2011  3:27 PM   Chief Complaint  Patient presents with  . Shortness of Breath  . Cough     (Include location/radiation/quality/duration/timing/severity/associated sxs/prior treatment) Patient is a 55 y.o. female presenting with shortness of breath and cough. The history is provided by the patient and a relative.  Shortness of Breath  The current episode started 5 to 7 days ago. The problem occurs continuously. The problem has been gradually worsening. The problem is moderate. The symptoms are relieved by nothing. Associated symptoms include cough, shortness of breath and wheezing. Pertinent negatives include no chest pain and no fever. The cough is productive. Nothing relieves the cough.  Cough Associated symptoms include shortness of breath and wheezing. Pertinent negatives include no chest pain, no headaches and no eye redness.   HX OF COPD AND GRADUALLY WORSENING OVER THE PAST WEEK LAST USED PROAIR YESTERDAY.  PRODUCITIVE COUGH YELLOW. NOT ON HOME OXYGEN. FOLLOWED BY [facility].   Past Medical History  Diagnosis Date  . Fibromyalgia   . Asthma   . DJD (degenerative joint disease)   . Chronic pain   . Depression   . Anxiety   . Panic attack   . Sleep disorder      Past Surgical History  Procedure Date  . Abdominal surgery   . Back surgery   . Carpal tunnel release   . Ankle surgery   . Knee surgery   . Cesarean section   . Tonsillectomy   . Tubal ligation     History reviewed. No pertinent family history.  History  Substance Use Topics  . Smoking status: Current Everyday Smoker    Types: Cigarettes  . Smokeless tobacco: Not on file  . Alcohol Use: Yes     occ    OB History    Grav Para Term Preterm Abortions TAB SAB Ect Mult Living                  Review of Systems  Constitutional: Negative for fever.  Eyes: Negative for redness.  Respiratory: Positive for cough, shortness of breath and  wheezing.   Cardiovascular: Negative for chest pain.  Genitourinary: Negative for dysuria and flank pain.  Musculoskeletal: Negative for back pain.  Neurological: Negative for headaches.  Psychiatric/Behavioral: Negative for confusion.    Allergies  Penicillins and Sulfa antibiotics  Home Medications   Current Outpatient Rx  Name Route Sig Dispense Refill  . ALBUTEROL SULFATE HFA 108 (90 BASE) MCG/ACT IN AERS Inhalation Inhale 2 puffs into the lungs every 6 (six) hours as needed.      . ALBUTEROL SULFATE (2.5 MG/3ML) 0.083% IN NEBU Nebulization Take 2.5 mg by nebulization every 6 (six) hours as needed. For shortness of breath    . CYCLOBENZAPRINE HCL 10 MG PO TABS Oral Take 10 mg by mouth as needed. Up to twice daily for sleep and muscle relaxation     . DIAZEPAM 5 MG PO TABS Oral Take 5 mg by mouth 2 (two) times daily as needed. For nerves    . DICLOFENAC SODIUM 1 % TD GEL Topical Apply 1 application topically 4 (four) times daily as needed. To knee for pain    . FLUTICASONE PROPIONATE 50 MCG/ACT NA SUSP Nasal Place 2 sprays into the nose daily.      . GUAIFENESIN-CODEINE 100-10 MG/5ML PO SYRP Oral Take 5 mLs by mouth 4 (four) times daily as needed. For cough      .  HYDROCODONE-ACETAMINOPHEN 10-500 MG PO TABS Oral Take 1 tablet by mouth every 6 (six) hours as needed. For pain    . LIDOCAINE 5 % EX PTCH Transdermal Place 1 patch onto the skin daily. As needed for pain: Remove & Discard patch within 12 hours or as directed by MD    . MONTELUKAST SODIUM 10 MG PO TABS Oral Take 10 mg by mouth daily.     . OLOPATADINE HCL 0.1 % OP SOLN Both Eyes Place 1 drop into both eyes 2 (two) times daily.      Marland Kitchen PREGABALIN 150 MG PO CAPS Oral Take 150 mg by mouth 2 (two) times daily.      Marland Kitchen TIOTROPIUM BROMIDE MONOHYDRATE 18 MCG IN CAPS Inhalation Place 18 mcg into inhaler and inhale every morning.     . CYMBALTA PO Oral Take 1 capsule by mouth daily.      Marland Kitchen LYRICA PO Oral Take 1 capsule by mouth 2  (two) times daily.      Marland Kitchen PRESCRIPTION MEDICATION Nasal Place 1 spray into the nose daily. PRESCRIPTION NASAL SPRAY     . PRESCRIPTION MEDICATION Both Ears Place 2 drops into both ears daily.        Physical Exam    BP 122/74  Pulse 96  Temp(Src) 98.2 F (36.8 C) (Oral)  Resp 22  SpO2 94%  Physical Exam  Nursing note and vitals reviewed. Constitutional: She is oriented to person, place, and time. She appears well-developed and well-nourished. She appears distressed.  HENT:  Head: Normocephalic and atraumatic.  Mouth/Throat: Oropharynx is clear and moist.  Eyes: Conjunctivae and EOM are normal. Pupils are equal, round, and reactive to light.  Neck: Normal range of motion. Neck supple.  Cardiovascular: Regular rhythm and normal heart sounds.   No murmur heard.      TACHY  Pulmonary/Chest: She is in respiratory distress. She has wheezes.  Abdominal: Soft. Bowel sounds are normal. There is no tenderness.  Musculoskeletal: Normal range of motion. She exhibits no edema.  Neurological: She is alert and oriented to person, place, and time. No cranial nerve deficit. She exhibits normal muscle tone. Coordination normal.  Skin: Skin is warm and dry. No rash noted. No erythema.    ED Course  CRITICAL CARE Performed by: Shelda Jakes. Authorized by: Shelda Jakes. Total critical care time: 30 minutes Critical care time was exclusive of separately billable procedures and treating other patients. Critical care was necessary to treat or prevent imminent or life-threatening deterioration of the following conditions: respiratory failure. Critical care was time spent personally by me on the following activities: discussions with consultants, evaluation of patient's response to treatment, examination of patient, ordering and performing treatments and interventions, ordering and review of laboratory studies, ordering and review of radiographic studies, pulse oximetry and re-evaluation of  patient's condition.    Results for orders placed during the hospital encounter of 05/19/11  CBC      Component Value Range   WBC 12.3 (*) 4.0 - 10.5 (K/uL)   RBC 5.45 (*) 3.87 - 5.11 (MIL/uL)   Hemoglobin 15.6 (*) 12.0 - 15.0 (g/dL)   HCT 40.9 (*) 81.1 - 46.0 (%)   MCV 91.6  78.0 - 100.0 (fL)   MCH 28.6  26.0 - 34.0 (pg)   MCHC 31.3  30.0 - 36.0 (g/dL)   RDW 91.4  78.2 - 95.6 (%)   Platelets 209  150 - 400 (K/uL)  BASIC METABOLIC PANEL  Component Value Range   Sodium 139  135 - 145 (mEq/L)   Potassium 3.6  3.5 - 5.1 (mEq/L)   Chloride 98  96 - 112 (mEq/L)   CO2 35 (*) 19 - 32 (mEq/L)   Glucose, Bld 159 (*) 70 - 99 (mg/dL)   BUN 5 (*) 6 - 23 (mg/dL)   Creatinine, Ser 1.61  0.50 - 1.10 (mg/dL)   Calcium 9.0  8.4 - 09.6 (mg/dL)   GFR calc non Af Amer >60  >60 (mL/min)   GFR calc Af Amer >60  >60 (mL/min)  BLOOD GAS, ARTERIAL      Component Value Range   O2 Content 2.0     Delivery systems NASAL CANNULA     pH, Arterial 7.277 (*) 7.350 - 7.400    pCO2 arterial 72.1 (*) 35.0 - 45.0 (mmHg)   pO2, Arterial 57.2 (*) 80.0 - 100.0 (mmHg)   Bicarbonate 32.6 (*) 20.0 - 24.0 (mEq/L)   TCO2 29.1  0 - 100 (mmol/L)   Acid-Base Excess 6.1 (*) 0.0 - 2.0 (mmol/L)   O2 Saturation 88.0     Patient temperature 37.0     Collection site LEFT RADIAL     Drawn by [id number]     Sample type ARTERIAL     Allens test (pass/fail) PASS  PASS    Dg Chest Portable 1 View  05/19/2011  *RADIOLOGY REPORT*  Clinical Data: Short of breath.  Wheezing.  PORTABLE CHEST - 1 VIEW  Comparison: 07/25/2010  Findings: Moderate cardiomegaly.  Lungs are clear.  No pneumothorax.  No pleural effusion.  IMPRESSION: Cardiomegaly without edema.  Original Report Authenticated By: Donavan Burnet, M.D.     1. COPD exacerbation      MDM IN ED REMAINED TIGHT IN LUNGS BUT IMPROVED INITIALLY VERY TIGHT WIT WHEEZING AND RX WITH ALBUTEROL AND ATROVENT AND THEN CONTINOUS NEB. CXR WITHOUT PNEUMONIA. REPEAT NEB WITH ALBUTEROL  AND ATROVENT STILL WHEEZING RX WITH SOLUMEDROL. ABG OBTAINED WHICH SHOWED RESPIRATORY ACIDOSIS AND HYPERCAPNEA. STILL ALERT AND RX WITH BIPAP FOR ADMISSION.        Shelda Jakes, MD 05/19/11 2139

## 2011-05-19 NOTE — ED Notes (Signed)
Pt states she feels better at this time. Pt still has some exp. Wheezing noted.

## 2011-05-20 DIAGNOSIS — J9602 Acute respiratory failure with hypercapnia: Secondary | ICD-10-CM | POA: Diagnosis present

## 2011-05-20 LAB — BASIC METABOLIC PANEL
BUN: 5 mg/dL — ABNORMAL LOW (ref 6–23)
Potassium: 4.3 mEq/L (ref 3.5–5.1)
Sodium: 140 mEq/L (ref 135–145)

## 2011-05-20 LAB — URINALYSIS, ROUTINE W REFLEX MICROSCOPIC
Bilirubin Urine: NEGATIVE
Ketones, ur: NEGATIVE mg/dL
Nitrite: NEGATIVE
Protein, ur: NEGATIVE mg/dL
Urobilinogen, UA: 0.2 mg/dL (ref 0.0–1.0)

## 2011-05-20 LAB — GLUCOSE, CAPILLARY
Glucose-Capillary: 188 mg/dL — ABNORMAL HIGH (ref 70–99)
Glucose-Capillary: 159 mg/dL — ABNORMAL HIGH (ref 70–99)

## 2011-05-20 LAB — HEMOGLOBIN A1C
Hgb A1c MFr Bld: 6 % — ABNORMAL HIGH (ref <5.7)
Mean Plasma Glucose: 126 mg/dL — ABNORMAL HIGH (ref <117)

## 2011-05-20 LAB — CBC
Hemoglobin: 14.6 g/dL (ref 12.0–15.0)
MCHC: 30.5 g/dL (ref 30.0–36.0)
RBC: 5.21 MIL/uL — ABNORMAL HIGH (ref 3.87–5.11)
WBC: 7.5 10*3/uL (ref 4.0–10.5)

## 2011-05-20 LAB — MAGNESIUM: Magnesium: 2 mg/dL (ref 1.5–2.5)

## 2011-05-20 LAB — TSH: TSH: 0.564 u[IU]/mL (ref 0.350–4.500)

## 2011-05-20 MED ORDER — SALINE SPRAY 0.65 % NA SOLN
1.0000 | NASAL | Status: DC | PRN
  Administered 2011-05-20: 1 via NASAL
  Filled 2011-05-20 (×2): qty 44

## 2011-05-20 MED ORDER — AZITHROMYCIN 500 MG IV SOLR
INTRAVENOUS | Status: AC
  Filled 2011-05-20: qty 500

## 2011-05-20 NOTE — Progress Notes (Signed)
Subjective: This lady feels better than when she was admitted yesterday. Her wheezing is improved although she still has wheezing. Unfortunately, she continues to smoke 2 pack of cigarettes per day.           Physical Exam: Blood pressure 135/89, pulse 95, temperature 98.1 F (36.7 C), temperature source Oral, resp. rate 16, height 5\' 4"  (1.626 m), weight 104.6 kg (230 lb 9.6 oz), SpO2 91.00%. She looks systemically well and is not toxic or septic. She does have increased work of breathing at rest. There is no peripheral or central cyanosis. Lung fields show bilateral wheezing which is moderately tight. Heart sounds are present without murmurs. She is alert and orientated.   Investigations:  Basename 05/20/11 0430 05/19/11 1742  WBC 7.5 12.3*  NEUTROABS -- --  HGB 14.6 15.6*  HCT 47.8* 49.9*  MCV 91.7 91.6  PLT 217 209    Basename 05/20/11 0430 05/19/11 1742  NA 140 139  K 4.3 3.6  CL 100 98  CO2 32 35*  GLUCOSE 133* 159*  BUN 5* 5*  CREATININE <0.47* 0.59  CALCIUM 9.3 9.0  MG 2.0 --  PHOS -- --   No results found for this or any previous visit (from the past 240 hour(s)).  Dg Chest Portable 1 View  05/19/2011  *RADIOLOGY REPORT*  Clinical Data: Short of breath.  Wheezing.  PORTABLE CHEST - 1 VIEW  Comparison: 07/25/2010  Findings: Moderate cardiomegaly.  Lungs are clear.  No pneumothorax.  No pleural effusion.  IMPRESSION: Cardiomegaly without edema.  Original Report Authenticated By: Donavan Burnet, M.D.      Medications: I have reviewed the patient's current medications.  Impression: 1. Acute asthma/COPD exacerbation. 2. Ongoing heavy tobacco abuse. 3. Obesity.     Plan: 1. Continue intravenous steroids and antibiotics. 2. Discussed tobacco cessation and weight loss.     LOS: 1 day   GOSRANI,NIMISH C 05/20/2011, 10:56 AM

## 2011-05-20 NOTE — Plan of Care (Signed)
Problem: Consults Goal: Diabetes Guidelines if Diabetic/Glucose > 140 If diabetic or lab glucose is > 140 mg/dl - Initiate Diabetes/Hyperglycemia Guidelines & Document Interventions  Outcome: Progressing Path reviewed and patient assessed.  Will continue to monitor.     

## 2011-05-20 NOTE — Progress Notes (Signed)
UR Chart Review Completed  

## 2011-05-21 LAB — COMPREHENSIVE METABOLIC PANEL
Alkaline Phosphatase: 89 U/L (ref 39–117)
BUN: 9 mg/dL (ref 6–23)
Chloride: 99 mEq/L (ref 96–112)
GFR calc Af Amer: >60 mL/min (ref >60)
Glucose, Bld: 110 mg/dL — ABNORMAL HIGH (ref 70–99)
Sodium: 139 mEq/L (ref 135–145)
Total Bilirubin: 0.2 mg/dL — ABNORMAL LOW (ref 0.3–1.2)
Total Protein: 6.2 g/dL (ref 6.0–8.3)

## 2011-05-21 LAB — GLUCOSE, CAPILLARY: Glucose-Capillary: 138 mg/dL — ABNORMAL HIGH (ref 70–99)

## 2011-05-21 LAB — URINE CULTURE

## 2011-05-21 LAB — CBC
MCH: 27.8 pg (ref 26.0–34.0)
MCV: 91.8 fL (ref 78.0–100.0)
Platelets: 207 10*3/uL (ref 150–400)
RBC: 5 MIL/uL (ref 3.87–5.11)
RDW: 15.3 % (ref 11.5–15.5)
WBC: 12 10*3/uL — ABNORMAL HIGH (ref 4.0–10.5)

## 2011-05-21 MED ORDER — METHYLPREDNISOLONE SODIUM SUCC 125 MG IJ SOLR
125.0000 mg | Freq: Three times a day (TID) | INTRAMUSCULAR | Status: DC
  Administered 2011-05-21 (×3): 125 mg via INTRAVENOUS
  Filled 2011-05-21 (×3): qty 2

## 2011-05-21 MED ORDER — SODIUM CHLORIDE 0.9 % IJ SOLN
INTRAMUSCULAR | Status: AC
  Filled 2011-05-21: qty 10

## 2011-05-21 MED ORDER — SODIUM CHLORIDE 0.9 % IJ SOLN
INTRAMUSCULAR | Status: AC
  Administered 2011-05-21: 11:00:00
  Filled 2011-05-21: qty 10

## 2011-05-21 NOTE — Progress Notes (Signed)
Subjective: This lady feels better than  yesterday. She has a nonspecific headache, frontal , this morning. Unfortunately, she continues to smoke 2 pack of cigarettes per day.           Physical Exam: Blood pressure 127/81, pulse 90, temperature 97.6 F (36.4 C), temperature source Oral, resp. rate 20, height 5\' 4"  (1.626 m), weight 108.727 kg (239 lb 11.2 oz), SpO2 93.00%. She looks systemically well. There is no increased work of breathing. Lung fields show bilateral wheezing but not as tight or as widespread as yesterday. There are no focal neurological signs.   Investigations:  Basename 05/21/11 0456 05/20/11 0430  WBC 12.0* 7.5  NEUTROABS -- --  HGB 13.9 14.6  HCT 45.9 47.8*  MCV 91.8 91.7  PLT 207 217    Basename 05/21/11 0456 05/20/11 0430  NA 139 140  K 4.3 4.3  CL 99 100  CO2 34* 32  GLUCOSE 110* 133*  BUN 9 5*  CREATININE 0.55 <0.47*  CALCIUM 8.7 9.3  MG -- 2.0  PHOS -- --     Dg Chest Portable 1 View  05/19/2011  *RADIOLOGY REPORT*  Clinical Data: Short of breath.  Wheezing.  PORTABLE CHEST - 1 VIEW  Comparison: 07/25/2010  Findings: Moderate cardiomegaly.  Lungs are clear.  No pneumothorax.  No pleural effusion.  IMPRESSION: Cardiomegaly without edema.  Original Report Authenticated By: Donavan Burnet, M.D.      Medications: I have reviewed the patient's current medications.  Impression: 1. Acute asthma/COPD exacerbation, improving. 2. Ongoing heavy tobacco abuse. 3. Obesity.     Plan: 1. Reduce intravenous steroids. 2. Continue with all other medications.     LOS: 2 days   Jian Hodgman C 05/21/2011, 10:55 AM

## 2011-05-22 LAB — COMPREHENSIVE METABOLIC PANEL
BUN: 13 mg/dL (ref 6–23)
CO2: 35 mEq/L — ABNORMAL HIGH (ref 19–32)
Calcium: 8.7 mg/dL (ref 8.4–10.5)
Creatinine, Ser: 0.55 mg/dL (ref 0.50–1.10)
GFR calc Af Amer: >60 mL/min (ref >60)
GFR calc non Af Amer: >60 mL/min (ref >60)
Glucose, Bld: 112 mg/dL — ABNORMAL HIGH (ref 70–99)

## 2011-05-22 LAB — CBC
HCT: 45.1 % (ref 36.0–46.0)
Hemoglobin: 13.8 g/dL (ref 12.0–15.0)
MCH: 27.7 pg (ref 26.0–34.0)
MCV: 90.6 fL (ref 78.0–100.0)
RBC: 4.98 MIL/uL (ref 3.87–5.11)

## 2011-05-22 LAB — GLUCOSE, CAPILLARY: Glucose-Capillary: 141 mg/dL — ABNORMAL HIGH (ref 70–99)

## 2011-05-22 MED ORDER — PREDNISONE 20 MG PO TABS
40.0000 mg | ORAL_TABLET | Freq: Every day | ORAL | Status: DC
  Administered 2011-05-22 – 2011-05-23 (×2): 40 mg via ORAL
  Filled 2011-05-22 (×2): qty 2

## 2011-05-22 MED ORDER — NYSTATIN 100000 UNIT/ML MT SUSP
5.0000 mL | Freq: Four times a day (QID) | OROMUCOSAL | Status: DC
  Administered 2011-05-22 – 2011-05-23 (×4): 500000 [IU] via ORAL
  Filled 2011-05-22 (×4): qty 5

## 2011-05-22 MED ORDER — OXYCODONE HCL 5 MG PO TABS
10.0000 mg | ORAL_TABLET | ORAL | Status: DC | PRN
  Administered 2011-05-22 – 2011-05-23 (×6): 10 mg via ORAL
  Filled 2011-05-22 (×7): qty 2

## 2011-05-22 MED ORDER — DIAZEPAM 5 MG PO TABS
5.0000 mg | ORAL_TABLET | Freq: Three times a day (TID) | ORAL | Status: DC | PRN
  Administered 2011-05-22 – 2011-05-23 (×2): 5 mg via ORAL
  Filled 2011-05-22 (×2): qty 1

## 2011-05-22 MED ORDER — ALBUTEROL SULFATE (5 MG/ML) 0.5% IN NEBU: 2.5000 mg | INHALATION_SOLUTION | RESPIRATORY_TRACT | Status: DC

## 2011-05-22 MED ORDER — IPRATROPIUM BROMIDE 0.02 % IN SOLN
0.5000 mg | Freq: Four times a day (QID) | RESPIRATORY_TRACT | Status: DC
  Administered 2011-05-23: 0.5 mg via RESPIRATORY_TRACT
  Filled 2011-05-22: qty 2.5

## 2011-05-22 MED ORDER — SODIUM CHLORIDE 0.9 % IJ SOLN
INTRAMUSCULAR | Status: AC
  Administered 2011-05-22: 10 mL
  Filled 2011-05-22: qty 10

## 2011-05-22 NOTE — Progress Notes (Signed)
Subjective: This lady has multiple complaints including headache, chronic pain and irritability and tearfulness. I told her that some of these symptoms are likely related to intravenous high-dose steroids. She also feels she is getting oral thrush.           Physical Exam: Blood pressure 119/78, pulse 87, temperature 97.3 F (36.3 C), temperature source Oral, resp. rate 19, height 5\' 4"  (1.626 m), weight 110.814 kg (244 lb 4.8 oz), SpO2 93.00%. She looks systemically well. There is no increased work of breathing. Her lungs today actually sound better although she still is wheezing and she is less tight. There is no bronchial breathing or crackles. She is alert and orientated.   Investigations:  Basename 05/22/11 0600 05/21/11 0456  WBC 9.5 12.0*  NEUTROABS -- --  HGB 13.8 13.9  HCT 45.1 45.9  MCV 90.6 91.8  PLT 225 207    Basename 05/22/11 0600 05/21/11 0456 05/20/11 0430  NA 138 139 --  K 3.7 4.3 --  CL 98 99 --  CO2 35* 34* --  GLUCOSE 112* 110* --  BUN 13 9 --  CREATININE 0.55 0.55 --  CALCIUM 8.7 8.7 --  MG -- -- 2.0  PHOS -- -- --     No results found.    Medications: I have reviewed the patient's current medications.  Impression: 1. Acute asthma/COPD exacerbation, improving. 2. Ongoing heavy tobacco abuse. 3. Obesity.     Plan: 1. Discontinue intravenous steroids and convert to oral steroids now. 2. Increase oral opioids, increase diazepam to 3 times a day when necessary. 3. Discharge home soon.     LOS: 3 days   Takeshi Teasdale C 05/22/2011, 10:53 AM

## 2011-05-23 MED ORDER — NYSTATIN 100000 UNIT/ML MT SUSP: 5.0000 mL | Freq: Four times a day (QID) | OROMUCOSAL | Status: AC

## 2011-05-23 MED ORDER — ALBUTEROL SULFATE (5 MG/ML) 0.5% IN NEBU
2.5000 mg | INHALATION_SOLUTION | Freq: Four times a day (QID) | RESPIRATORY_TRACT | Status: DC
  Administered 2011-05-23: 2.5 mg via RESPIRATORY_TRACT
  Filled 2011-05-23: qty 0.5

## 2011-05-23 MED ORDER — HYDROCODONE-ACETAMINOPHEN 10-500 MG PO TABS: 1.0000 | ORAL_TABLET | Freq: Four times a day (QID) | ORAL | Status: DC | PRN

## 2011-05-23 MED ORDER — GUAIFENESIN-CODEINE 100-10 MG/5ML PO SYRP: 5.0000 mL | ORAL_SOLUTION | Freq: Four times a day (QID) | ORAL | Status: DC | PRN

## 2011-05-23 MED ORDER — AZITHROMYCIN 500 MG PO TABS: 500.0000 mg | ORAL_TABLET | Freq: Every day | ORAL | Status: AC

## 2011-05-23 MED ORDER — PREDNISONE 20 MG PO TABS: 40.0000 mg | ORAL_TABLET | Freq: Every day | ORAL | Status: AC

## 2011-05-23 MED ORDER — NICOTINE 21 MG/24HR TD PT24: 21.0000 | MEDICATED_PATCH | Freq: Every day | TRANSDERMAL | Status: AC

## 2011-05-23 NOTE — Plan of Care (Signed)
Problem: Discharge Progression Outcomes Goal: Other Discharge Outcomes/Goals Outcome: Completed/Met Date Met:  05/23/11 Discharge instructions read to pt  All questions answered to pt satisfaction.  RX meds as ordered by MD delivered to pt room prior to discharge.  Pt discharged to home with family

## 2011-05-23 NOTE — Discharge Summary (Signed)
Physician Discharge Summary  Patient ID: Bethany Villa MRN: [id number] DOB/AGE: 54-Jun-1957 54 y.o. Primary Care Physician:MOORE,DONALD Andrey Campanile, MD Admit date: 05/19/2011 Discharge date: 05/23/2011    Discharge Diagnoses:  1. Exacerbation of asthma/COPD. 2. Ongoing tobacco abuse. 3. Chronic pain disorder. 4. Obesity.   Current Discharge Medication List    START taking these medications   Details  azithromycin (ZITHROMAX) 500 MG tablet Take 1 tablet (500 mg total) by mouth daily. Qty: 5 tablet, Refills: 0    nicotine (NICODERM CQ - DOSED IN MG/24 HOURS) 21 mg/24hr patch Place 21 patches onto the skin daily. Qty: 21 patch, Refills: 0    nystatin (MYCOSTATIN) 100000 UNIT/ML suspension Take 5 mLs (500,000 Units total) by mouth 4 (four) times daily. Qty: 60 mL, Refills: 0    predniSONE (DELTASONE) 20 MG tablet Take 2 tablets (40 mg total) by mouth daily before breakfast. Take 2 tablets daily for 4 days, then 1 tablet daily for 4 days, then half tablet daily for 4 days and then STOP. Qty: 14 tablet, Refills: 0      CONTINUE these medications which have CHANGED   Details  guaiFENesin-codeine (ROBITUSSIN AC) 100-10 MG/5ML syrup Take 5 mLs by mouth 4 (four) times daily as needed. For cough  Qty: 120 mL, Refills: 0    HYDROcodone-acetaminophen (LORTAB) 10-500 MG per tablet Take 1 tablet by mouth every 6 (six) hours as needed. For pain Qty: 30 tablet, Refills: 0      CONTINUE these medications which have NOT CHANGED   Details  albuterol (PROAIR HFA) 108 (90 BASE) MCG/ACT inhaler Inhale 2 puffs into the lungs every 6 (six) hours as needed.      albuterol (PROVENTIL) (2.5 MG/3ML) 0.083% nebulizer solution Take 2.5 mg by nebulization every 6 (six) hours as needed. For shortness of breath    cyclobenzaprine (FLEXERIL) 10 MG tablet Take 10 mg by mouth as needed. Up to twice daily for sleep and muscle relaxation     diazepam (VALIUM) 5 MG tablet Take 5 mg by mouth 2 (two) times daily  as needed. For nerves    diclofenac sodium (VOLTAREN) 1 % GEL Apply 1 application topically 4 (four) times daily as needed. To knee for pain    fluticasone (FLONASE) 50 MCG/ACT nasal spray Place 2 sprays into the nose daily.      lidocaine (LIDODERM) 5 % Place 1 patch onto the skin daily. As needed for pain: Remove & Discard patch within 12 hours or as directed by MD    montelukast (SINGULAIR) 10 MG tablet Take 10 mg by mouth daily.     olopatadine (PATANOL) 0.1 % ophthalmic solution Place 1 drop into both eyes 2 (two) times daily.      !! pregabalin (LYRICA) 150 MG capsule Take 150 mg by mouth 2 (two) times daily.      tiotropium (SPIRIVA) 18 MCG inhalation capsule Place 18 mcg into inhaler and inhale every morning.     DULoxetine HCl (CYMBALTA PO) Take 1 capsule by mouth daily.      !! Pregabalin (LYRICA PO) Take 1 capsule by mouth 2 (two) times daily.       !! - Potential duplicate medications found. Please discuss with provider.    STOP taking these medications     PRESCRIPTION MEDICATION      PRESCRIPTION MEDICATION         Discharged Condition: Improved and stable.    Consults: Home.  Significant Diagnostic Studies: Dg Chest Portable 1 View  05/19/2011  *  RADIOLOGY REPORT*  Clinical Data: Short of breath.  Wheezing.  PORTABLE CHEST - 1 VIEW  Comparison: 07/25/2010  Findings: Moderate cardiomegaly.  Lungs are clear.  No pneumothorax.  No pleural effusion.  IMPRESSION: Cardiomegaly without edema.  Original Report Authenticated By: Donavan Burnet, M.D.    Lab Results: Results for orders placed during the hospital encounter of 05/19/11 (from the past 48 hour(s))  GLUCOSE, CAPILLARY     Status: Abnormal   Collection Time   05/21/11 11:10 AM      Component Value Range Comment   Glucose-Capillary 154 (*) 70 - 99 (mg/dL)   GLUCOSE, CAPILLARY     Status: Abnormal   Collection Time   05/21/11  4:39 PM      Component Value Range Comment   Glucose-Capillary 138 (*) 70 - 99  (mg/dL)    Comment 1 Notify RN      Comment 2 Documented in Chart     GLUCOSE, CAPILLARY     Status: Abnormal   Collection Time   05/21/11  9:00 PM      Component Value Range Comment   Glucose-Capillary 141 (*) 70 - 99 (mg/dL)    Comment 1 Notify RN     CBC     Status: Normal   Collection Time   05/22/11  6:00 AM      Component Value Range Comment   WBC 9.5  4.0 - 10.5 (K/uL)    RBC 4.98  3.87 - 5.11 (MIL/uL)    Hemoglobin 13.8  12.0 - 15.0 (g/dL)    HCT 16.1  09.6 - 04.5 (%)    MCV 90.6  78.0 - 100.0 (fL)    MCH 27.7  26.0 - 34.0 (pg)    MCHC 30.6  30.0 - 36.0 (g/dL)    RDW 40.9  81.1 - 91.4 (%)    Platelets 225  150 - 400 (K/uL)   COMPREHENSIVE METABOLIC PANEL     Status: Abnormal   Collection Time   05/22/11  6:00 AM      Component Value Range Comment   Sodium 138  135 - 145 (mEq/L)    Potassium 3.7  3.5 - 5.1 (mEq/L)    Chloride 98  96 - 112 (mEq/L)    CO2 35 (*) 19 - 32 (mEq/L)    Glucose, Bld 112 (*) 70 - 99 (mg/dL)    BUN 13  6 - 23 (mg/dL)    Creatinine, Ser 7.82  0.50 - 1.10 (mg/dL)    Calcium 8.7  8.4 - 10.5 (mg/dL)    Total Protein 6.1  6.0 - 8.3 (g/dL)    Albumin 2.8 (*) 3.5 - 5.2 (g/dL)    AST 14  0 - 37 (U/L)    ALT 12  0 - 35 (U/L)    Alkaline Phosphatase 76  39 - 117 (U/L)    Total Bilirubin 0.2 (*) 0.3 - 1.2 (mg/dL)    GFR calc non Af Amer >60  >60 (mL/min)    GFR calc Af Amer >60  >60 (mL/min)   GLUCOSE, CAPILLARY     Status: Abnormal   Collection Time   05/22/11  7:29 AM      Component Value Range Comment   Glucose-Capillary 139 (*) 70 - 99 (mg/dL)   GLUCOSE, CAPILLARY     Status: Abnormal   Collection Time   05/22/11 11:29 AM      Component Value Range Comment   Glucose-Capillary 111 (*) 70 - 99 (mg/dL)  GLUCOSE, CAPILLARY     Status: Normal   Collection Time   05/22/11  4:26 PM      Component Value Range Comment   Glucose-Capillary 96  70 - 99 (mg/dL)   GLUCOSE, CAPILLARY     Status: Abnormal   Collection Time   05/22/11  8:52 PM       Component Value Range Comment   Glucose-Capillary 158 (*) 70 - 99 (mg/dL)    Comment 1 Notify RN     GLUCOSE, CAPILLARY     Status: Normal   Collection Time   05/23/11  7:42 AM      Component Value Range Comment   Glucose-Capillary 82  70 - 99 (mg/dL)    Recent Results (from the past 240 hour(s))  URINE CULTURE     Status: Normal   Collection Time   05/20/11  2:41 PM      Component Value Range Status Comment   Specimen Description URINE, CLEAN CATCH   Final    Special Requests NONE   Final    Setup Time [id number]   Final    Colony Count NO GROWTH   Final    Culture NO GROWTH   Final    Report Status 05/21/2011 FINAL   Final      Hospital Course: This 55 year old long-standing smoker was admitted with symptoms of cough and dyspnea for one week. Please see initial history and physical examination done by Dr. Vania Rea. She failed to improve with emergency room nebulized albuterol and blood gases were suggestive of type II respiratory failure. In fact, she was on BiPAP for a short time. She was started on intravenous steroids and antibiotics and she did improve. Her main problem seems to be smoking 2 packs of cigarettes per day. She has been counseled repeatedly regarding this. Today although she still wheezing her breathing is significantly improved. There was no evidence of pneumonia on chest x-ray. She is keen to quit smoking and has been given a nicotine patch.  Discharge Exam: Blood pressure 134/86, pulse 89, temperature 98 F (36.7 C), temperature source Oral, resp. rate 18, height 5\' 4"  (1.626 m), weight 110.7 kg (244 lb 0.8 oz), SpO2 97.00%. She does look systemically well and there is no increased work of breathing. She is not toxic or septic. There is no peripheral central cyanosis. There is no clubbing. Lung fields show bilateral wheezing which I think is probably chronic for her now. There is no crackles or bronchial breathing. Heart sounds are present and normal without  gallop rhythm,, murmurs. Jugular venous pressure not raised. She is alert and orientated without any focal neurological signs.  Disposition: Home. She will have a reducing course of steroids and continue with oral antibiotics for further 5 days. She realizes the urgent need to quit smoking. I hope she will do this.  Discharge Orders    Future Appointments: Provider: Department: Dept Phone: Center:   06/01/2011 9:00 AM Sandrea Hughs, MD [facility]  None     Future Orders Please Complete By Expires   Diet - low sodium heart healthy      Increase activity slowly      Discharge instructions      Comments:   No smoking.        SignedWilson Singer 05/23/2011, 10:23 AM

## 2011-05-31 NOTE — Progress Notes (Signed)
Encounter addended by: Clarene Critchley on: 05/31/2011  2:24 PM<BR>     Documentation filed: Flowsheet VN

## 2011-06-01 ENCOUNTER — Ambulatory Visit (INDEPENDENT_AMBULATORY_CARE_PROVIDER_SITE_OTHER): Payer: Medicare Other | Admitting: Internal Medicine

## 2011-06-01 ENCOUNTER — Encounter: Payer: Self-pay | Admitting: Internal Medicine

## 2011-06-01 DIAGNOSIS — J449 Chronic obstructive pulmonary disease, unspecified: Secondary | ICD-10-CM

## 2011-06-01 DIAGNOSIS — F17200 Nicotine dependence, unspecified, uncomplicated: Secondary | ICD-10-CM

## 2011-06-01 MED ORDER — BUDESONIDE-FORMOTEROL FUMARATE 160-4.5 MCG/ACT IN AERO: INHALATION_SPRAY | RESPIRATORY_TRACT | Status: DC

## 2011-06-01 MED ORDER — OMEPRAZOLE 40 MG PO CPDR: 40.0000 mg | DELAYED_RELEASE_CAPSULE | Freq: Every day | ORAL | Status: DC

## 2011-06-01 MED ORDER — FAMOTIDINE 20 MG PO TABS: ORAL_TABLET | ORAL | Status: DC

## 2011-06-01 MED ORDER — TIOTROPIUM BROMIDE MONOHYDRATE 18 MCG IN CAPS: 18.0000 ug | ORAL_CAPSULE | Freq: Every day | RESPIRATORY_TRACT | Status: DC

## 2011-06-01 NOTE — Progress Notes (Signed)
  Subjective:    Patient ID: Bethany Villa, female    DOB: [birth year], 55 y.o.   MRN: [id number]  HPI  40 yowf active smoker eval at [facility] in 1994 for DTC Asthma dx as GERD and underwent NF > much better until 2005 with increased stress,  Had exposure to hot humid weather and wt gain to 280 from a baseline f around 150 with freq flares and ER/Admits to [facility] referred by Dr Christell Constant for pulmonary eval 06/01/2011   06/01/2011 Initial pulmonary office eval in [facility] p ER eval  one week prior to ov with exac requiring prednisone to taper off by 10/6.  Only maint rx prior to last exac was singulair and spiriva with freq use of saba both day and night even prior to exac.  No purulent sputum, some choking on food but no overt hb symptoms at present.  Sob presently x sitting at rest, much worse when lies down with noct saba use continuing even on prednisone.     Review of Systems  Constitutional: Negative for fever and unexpected weight change.  HENT: Positive for ear pain, sore throat and trouble swallowing. Negative for nosebleeds, congestion, rhinorrhea, sneezing, dental problem, postnasal drip and sinus pressure.   Eyes: Negative for redness and itching.  Respiratory: Positive for cough, chest tightness, shortness of breath and wheezing.   Cardiovascular: Positive for leg swelling. Negative for palpitations.  Gastrointestinal: Negative for nausea and vomiting.  Genitourinary: Negative for dysuria.  Musculoskeletal: Negative for joint swelling.  Skin: Negative for rash.  Neurological: Positive for headaches.  Hematological: Does not bruise/bleed easily.  Psychiatric/Behavioral: Negative for dysphoric mood. The patient is not nervous/anxious.        Objective:   Physical Exam  Hoarse amb wf  Wt 06/01/2011  HEENT mild turbinate edema.  Oropharynx no thrush or excess pnd or cobblestoning.  No JVD or cervical adenopathy. Mild accessory muscle hypertrophy. Trachea midline, nl thryroid. Chest was  hyperinflated by percussion with diminished breath sounds and moderate increased exp time without wheeze. Hoover sign positive at mid inspiration. Regular rate and rhythm without murmur gallop or rub or increase P2 or edema.  Abd: no hsm, nl excursion. Ext warm without cyanosis or clubbing.      07/26/10 CT chest 1. Mild centrilobular emphysema. No acute findings in the chest.  2. Negative for pulmonary embolism  Assessment & Plan:

## 2011-06-01 NOTE — Patient Instructions (Addendum)
You have relatively mild copd with a large reversible/asthmatic component - the important aspect of your care is to stop smoker (remember the Primitivo Gauze rule)  Work on perfecting inhaler technique:  relax and gently blow all the way out then take a nice smooth deep breath back in, triggering the inhaler at same time you start breathing in.  Hold for up to 5 seconds if you can.  Rinse and gargle with water when done   If your mouth or throat starts to bother you,   I suggest you time the inhaler to your dental care and after using the inhaler(s) brush teeth and tongue with a baking soda containing toothpaste and when you rinse this out, gargle with it first to see if this helps your mouth and throat.     Start symbicort Take 2 puffs first thing in am and then another 2 puffs about 12 hours later.   Continue spiriva right after the am dose of symbicort  Start nexium (omeprazole) 40 mg   Take 30-60 min before first meal of the day and Pepcid 20 mg one bedtime  GERD (REFLUX)  is an extremely common cause of respiratory symptoms, many times with no significant heartburn at all.    It can be treated with medication, but also with lifestyle changes including avoidance of late meals, excessive alcohol, smoking cessation, and avoid fatty foods, chocolate, peppermint, colas, red wine, and acidic juices such as orange juice.  NO MINT OR MENTHOL PRODUCTS SO NO COUGH DROPS  USE SUGARLESS CANDY INSTEAD (jolley ranchers or Stover's)  NO OIL BASED VITAMINS - use powdered substitutes.    Please schedule a follow up office visit in 4 weeks, sooner if needed

## 2011-06-01 NOTE — Assessment & Plan Note (Signed)
DDX of  difficult airways managment all start with A and  include Adherence, Ace Inhibitors, Acid Reflux, Active Sinus Disease, Alpha 1 Antitripsin deficiency, Anxiety masquerading as Airways dz,  ABPA,  allergy(esp in young), Aspiration (esp in elderly), Adverse effects of DPI,  Active smokers, plus two Bs  = Bronchiectasis and Beta blocker use..and one C= CHF   In this case Adherence is the biggest issue and starts with  inability to use HFA effectively and also  understand that SABA treats the symptoms but doesn't get to the underlying problem (inflammation).  I used  the analogy of putting steroid cream on a rash to help explain the meaning of topical therapy and the need to get the drug to the target tissue.  The proper method of use, as well as anticipated side effects, of this metered-dose inhaler are discussed and demonstrated to the patient. Improved to 90% with coaching.  Start symbicort 160 2bid  Active smoking: discussed separately  Acid Reflux:  Had it before, probably has it again: See instructions for specific recommendations which were reviewed directly with the patient who was given a copy with highlighter outlining the key components.

## 2011-06-01 NOTE — Assessment & Plan Note (Signed)
I took an extended  opportunity with this patient to outline the consequences of continued cigarette use  in airway disorders based on all the data we have from the multiple national lung health studies (perfomed over decades at millions of dollars in cost)  indicating that smoking cessation, not choice of inhalers or physicians, is the most important aspect of care.    She doesn't appear all that severe but  I emphasized that although we never turn away smokers from the pulmonary clinic, we do ask that they understand that the recommendations that we make  won't work nearly as well in the presence of continued cigarette exposure.  In fact, we may very well  reach a point where we can't promise to help the patient if he/she can't quit smoking. (We can and will promise to try to help, we just can't promise what we recommend will really work)

## 2011-06-02 ENCOUNTER — Telehealth: Payer: Self-pay | Admitting: *Deleted

## 2011-06-02 NOTE — Telephone Encounter (Signed)
I [id number] to get pt scheduled for her 4 week f/u in [place] with Dr. Sherene Sires. Carron Curie, CMA

## 2011-06-02 NOTE — Telephone Encounter (Signed)
Pt called back and [facility] scheduled appt.

## 2011-07-06 ENCOUNTER — Encounter (INDEPENDENT_AMBULATORY_CARE_PROVIDER_SITE_OTHER): Payer: Medicare Other | Admitting: Internal Medicine

## 2011-07-06 DIAGNOSIS — J449 Chronic obstructive pulmonary disease, unspecified: Secondary | ICD-10-CM

## 2011-07-06 NOTE — Progress Notes (Signed)
  Subjective:    Patient ID: Bethany Villa, female    DOB: 1956/02/05, 55 y.o.   MRN: [id number]  HPI  36 yowf active smoker eval at [facility] in 1994 for DTC Asthma dx as GERD and underwent NF > much better until 2005 with increased stress,  Had exposure to hot humid weather and wt gain to 280 from a baseline f around 150 with freq flares and ER/Admits to [facility] referred by Dr Bethany Villa for pulmonary eval 06/01/2011   06/01/2011 Initial pulmonary office eval in [facility] p ER eval  one week prior to ov with exac requiring prednisone to taper off by 10/6.  Only maint rx prior to last exac was singulair and spiriva with freq use of saba both day and night even prior to exac.  No purulent sputum, some choking on food but no overt hb symptoms at present.  Sob presently x sitting at rest, much worse when lies down with noct saba use continuing even on prednisone. rec You have relatively mild copd with a large reversible/asthmatic component - the important aspect of your care is to stop smoker (remember the Primitivo Gauze rule) Work on Chemical engineer technique:  Start symbicort Take 2 puffs first thing in am and then another 2 puffs about 12 hours later.  Continue spiriva right after the am dose of symbicort Start nexium (omeprazole) 40 mg   Take 30-60 min before first meal of the day and Pepcid 20 mg one bedtime GERD diet  07/06/2011 f/u ov/Bethany Villa cc         Objective:   Physical Exam  Hoarse amb wf  Wt 06/01/2011  238   > 07/06/2011  HEENT mild turbinate edema.  Oropharynx no thrush or excess pnd or cobblestoning.  No JVD or cervical adenopathy. Mild accessory muscle hypertrophy. Trachea midline, nl thryroid. Chest was hyperinflated by percussion with diminished breath sounds and moderate increased exp time without wheeze. Hoover sign positive at mid inspiration. Regular rate and rhythm without murmur gallop or rub or increase P2 or edema.  Abd: no hsm, nl excursion. Ext warm without cyanosis or clubbing.     07/26/10 CT chest 1. Mild centrilobular emphysema. No acute findings in the chest.  2. Negative for pulmonary embolism  Assessment & Plan:

## 2011-08-09 ENCOUNTER — Other Ambulatory Visit: Payer: Self-pay

## 2011-08-09 ENCOUNTER — Emergency Department (HOSPITAL_COMMUNITY): Payer: Medicare Other

## 2011-08-09 ENCOUNTER — Encounter (HOSPITAL_COMMUNITY): Payer: Self-pay | Admitting: Emergency Medicine

## 2011-08-09 ENCOUNTER — Inpatient Hospital Stay (HOSPITAL_COMMUNITY)
Admission: EM | Admit: 2011-08-09 | Discharge: 2011-08-11 | DRG: 189 | Disposition: A | Discharge status: Home / Self Care | Payer: Medicare Other | Attending: Internal Medicine | Admitting: Internal Medicine

## 2011-08-09 DIAGNOSIS — R0603 Acute respiratory distress: Secondary | ICD-10-CM

## 2011-08-09 DIAGNOSIS — IMO0001 Reserved for inherently not codable concepts without codable children: Secondary | ICD-10-CM | POA: Diagnosis present

## 2011-08-09 DIAGNOSIS — F329 Major depressive disorder, single episode, unspecified: Secondary | ICD-10-CM | POA: Diagnosis present

## 2011-08-09 DIAGNOSIS — F17200 Nicotine dependence, unspecified, uncomplicated: Secondary | ICD-10-CM | POA: Diagnosis present

## 2011-08-09 DIAGNOSIS — J441 Chronic obstructive pulmonary disease with (acute) exacerbation: Secondary | ICD-10-CM | POA: Diagnosis present

## 2011-08-09 DIAGNOSIS — F411 Generalized anxiety disorder: Secondary | ICD-10-CM | POA: Diagnosis present

## 2011-08-09 DIAGNOSIS — E669 Obesity, unspecified: Secondary | ICD-10-CM | POA: Diagnosis present

## 2011-08-09 DIAGNOSIS — M1990 Unspecified osteoarthritis, unspecified site: Secondary | ICD-10-CM | POA: Diagnosis present

## 2011-08-09 DIAGNOSIS — J9600 Acute respiratory failure, unspecified whether with hypoxia or hypercapnia: Principal | ICD-10-CM | POA: Diagnosis present

## 2011-08-09 DIAGNOSIS — F3289 Other specified depressive episodes: Secondary | ICD-10-CM | POA: Diagnosis present

## 2011-08-09 DIAGNOSIS — J449 Chronic obstructive pulmonary disease, unspecified: Secondary | ICD-10-CM

## 2011-08-09 DIAGNOSIS — J9602 Acute respiratory failure with hypercapnia: Secondary | ICD-10-CM

## 2011-08-09 DIAGNOSIS — R9389 Abnormal findings on diagnostic imaging of other specified body structures: Secondary | ICD-10-CM | POA: Diagnosis present

## 2011-08-09 DIAGNOSIS — G8929 Other chronic pain: Secondary | ICD-10-CM | POA: Diagnosis present

## 2011-08-09 DIAGNOSIS — G894 Chronic pain syndrome: Secondary | ICD-10-CM

## 2011-08-09 DIAGNOSIS — R739 Hyperglycemia, unspecified: Secondary | ICD-10-CM

## 2011-08-09 LAB — CBC
HCT: 44.5 % (ref 36.0–46.0)
Hemoglobin: 13.7 g/dL (ref 12.0–15.0)
MCH: 28.5 pg (ref 26.0–34.0)
MCHC: 30.8 g/dL (ref 30.0–36.0)
MCV: 92.7 fL (ref 78.0–100.0)
Platelets: 171 K/uL (ref 150–400)
RBC: 4.8 MIL/uL (ref 3.87–5.11)
RDW: 16.6 % — ABNORMAL HIGH (ref 11.5–15.5)
WBC: 6.3 K/uL (ref 4.0–10.5)

## 2011-08-09 LAB — BLOOD GAS, ARTERIAL
Acid-Base Excess: 8 mmol/L — ABNORMAL HIGH (ref 0.0–2.0)
Bicarbonate: 34 mEq/L — ABNORMAL HIGH (ref 20.0–24.0)
Drawn by: 23534
O2 Content: 2 L/min
O2 Saturation: 90.2 %
Patient temperature: 37
TCO2: 30.5 mmol/L (ref 0–100)
pCO2 arterial: 68 mmHg (ref 35.0–45.0)
pH, Arterial: 7.32 — ABNORMAL LOW (ref 7.350–7.400)
pO2, Arterial: 62.7 mmHg — ABNORMAL LOW (ref 80.0–100.0)

## 2011-08-09 LAB — DIFFERENTIAL
Basophils Absolute: 0 K/uL (ref 0.0–0.1)
Basophils Relative: 0 % (ref 0–1)
Eosinophils Absolute: 0 10*3/uL (ref 0.0–0.7)
Eosinophils Relative: 1 % (ref 0–5)
Lymphocytes Relative: 12 % (ref 12–46)
Lymphs Abs: 0.7 K/uL (ref 0.7–4.0)
Monocytes Absolute: 0.3 10*3/uL (ref 0.1–1.0)
Monocytes Relative: 5 % (ref 3–12)
Neutro Abs: 5.1 K/uL (ref 1.7–7.7)
Neutrophils Relative %: 82 % — ABNORMAL HIGH (ref 43–77)

## 2011-08-09 LAB — BASIC METABOLIC PANEL
BUN: 4 mg/dL — ABNORMAL LOW (ref 6–23)
Calcium: 9.3 mg/dL (ref 8.4–10.5)
Chloride: 97 mEq/L (ref 96–112)
Creatinine, Ser: 0.53 mg/dL (ref 0.50–1.10)
GFR calc Af Amer: >90 mL/min (ref >90)
GFR calc non Af Amer: >90 mL/min (ref >90)

## 2011-08-09 LAB — POCT I-STAT TROPONIN I: Troponin i, poc: 0.01 ng/mL (ref 0.00–0.08)

## 2011-08-09 LAB — BASIC METABOLIC PANEL WITH GFR
CO2: 38 meq/L — ABNORMAL HIGH (ref 19–32)
Glucose, Bld: 116 mg/dL — ABNORMAL HIGH (ref 70–99)
Potassium: 3.7 meq/L (ref 3.5–5.1)
Sodium: 141 meq/L (ref 135–145)

## 2011-08-09 MED ORDER — FAMOTIDINE 20 MG PO TABS
20.0000 mg | ORAL_TABLET | Freq: Every day | ORAL | Status: DC
  Administered 2011-08-09 – 2011-08-10 (×2): 20 mg via ORAL
  Filled 2011-08-09 (×2): qty 1

## 2011-08-09 MED ORDER — PREGABALIN 75 MG PO CAPS
150.0000 mg | ORAL_CAPSULE | Freq: Two times a day (BID) | ORAL | Status: DC
  Administered 2011-08-09 – 2011-08-11 (×4): 150 mg via ORAL
  Filled 2011-08-09 (×4): qty 2

## 2011-08-09 MED ORDER — MOXIFLOXACIN HCL IN NACL 400 MG/250ML IV SOLN: 400.0000 mg | INTRAVENOUS | Status: DC

## 2011-08-09 MED ORDER — IPRATROPIUM BROMIDE 0.02 % IN SOLN
RESPIRATORY_TRACT | Status: AC
  Administered 2011-08-09: 0.5 mg
  Filled 2011-08-09: qty 2.5

## 2011-08-09 MED ORDER — HYDROCOD POLST-CHLORPHEN POLST 10-8 MG/5ML PO LQCR
5.0000 mL | Freq: Once | ORAL | Status: AC
  Administered 2011-08-09: 5 mL via ORAL
  Filled 2011-08-09: qty 5

## 2011-08-09 MED ORDER — CYCLOBENZAPRINE HCL 10 MG PO TABS
10.0000 mg | ORAL_TABLET | Freq: Every day | ORAL | Status: DC
  Administered 2011-08-09 – 2011-08-10 (×2): 10 mg via ORAL
  Filled 2011-08-09 (×2): qty 1

## 2011-08-09 MED ORDER — METHYLPREDNISOLONE SODIUM SUCC 125 MG IJ SOLR: 125.0000 mg | Freq: Once | INTRAMUSCULAR | Status: DC

## 2011-08-09 MED ORDER — ALBUTEROL SULFATE (5 MG/ML) 0.5% IN NEBU
2.5000 mg | INHALATION_SOLUTION | Freq: Four times a day (QID) | RESPIRATORY_TRACT | Status: DC
  Administered 2011-08-09 – 2011-08-11 (×6): 2.5 mg via RESPIRATORY_TRACT
  Filled 2011-08-09 (×7): qty 0.5

## 2011-08-09 MED ORDER — KETOROLAC TROMETHAMINE 30 MG/ML IJ SOLN
15.0000 mg | Freq: Once | INTRAMUSCULAR | Status: AC
  Administered 2011-08-09: 15 mg via INTRAVENOUS
  Filled 2011-08-09: qty 1

## 2011-08-09 MED ORDER — METHYLPREDNISOLONE SODIUM SUCC 125 MG IJ SOLR
80.0000 mg | Freq: Two times a day (BID) | INTRAMUSCULAR | Status: DC
  Administered 2011-08-09: 80 mg via INTRAVENOUS
  Filled 2011-08-09: qty 2

## 2011-08-09 MED ORDER — DULOXETINE HCL 60 MG PO CPEP
60.0000 mg | ORAL_CAPSULE | Freq: Every day | ORAL | Status: DC
  Administered 2011-08-10 – 2011-08-11 (×2): 60 mg via ORAL
  Filled 2011-08-09 (×2): qty 1

## 2011-08-09 MED ORDER — MONTELUKAST SODIUM 10 MG PO TABS
10.0000 mg | ORAL_TABLET | ORAL | Status: DC
  Administered 2011-08-10 – 2011-08-11 (×2): 10 mg via ORAL
  Filled 2011-08-09 (×2): qty 1

## 2011-08-09 MED ORDER — HYDROCOD POLST-CHLORPHEN POLST 10-8 MG/5ML PO LQCR
5.0000 mL | Freq: Two times a day (BID) | ORAL | Status: DC
  Administered 2011-08-09: 5 mL via ORAL
  Filled 2011-08-09: qty 5

## 2011-08-09 MED ORDER — ENOXAPARIN SODIUM 40 MG/0.4ML ~~LOC~~ SOLN
40.0000 mg | SUBCUTANEOUS | Status: DC
  Administered 2011-08-09 – 2011-08-10 (×2): 40 mg via SUBCUTANEOUS
  Filled 2011-08-09 (×2): qty 0.4

## 2011-08-09 MED ORDER — MOXIFLOXACIN HCL IN NACL 400 MG/250ML IV SOLN
400.0000 mg | Freq: Once | INTRAVENOUS | Status: AC
  Administered 2011-08-09: 400 mg via INTRAVENOUS
  Filled 2011-08-09: qty 250

## 2011-08-09 MED ORDER — ALBUTEROL SULFATE (5 MG/ML) 0.5% IN NEBU
2.5000 mg | INHALATION_SOLUTION | RESPIRATORY_TRACT | Status: DC | PRN
  Administered 2011-08-09: 2.5 mg via RESPIRATORY_TRACT
  Filled 2011-08-09: qty 0.5

## 2011-08-09 MED ORDER — ALBUTEROL SULFATE (5 MG/ML) 0.5% IN NEBU
10.0000 mg | INHALATION_SOLUTION | Freq: Once | RESPIRATORY_TRACT | Status: AC
  Administered 2011-08-09: 10 mg via RESPIRATORY_TRACT
  Filled 2011-08-09: qty 2

## 2011-08-09 MED ORDER — OLOPATADINE HCL 0.1 % OP SOLN
1.0000 [drp] | Freq: Two times a day (BID) | OPHTHALMIC | Status: DC
  Administered 2011-08-10 – 2011-08-11 (×3): 1 [drp] via OPHTHALMIC
  Filled 2011-08-09: qty 5

## 2011-08-09 NOTE — H&P (Signed)
PCP:   Bethany Becton, MD, MD   Chief Complaint:  Worsening shortness of breath and wheezing for a week  HPI: 55 year old female with a history of asthma since her early 14s and tobacco abuse who presents to the ED because of worsening wheezing and shortness of breath over the last week. She has significant allergies and her asthma always acts up around winter time and has actually been fighting asthma flare since November of this year. She has not been on steroids a lot because she does not like to take steroids. She denies any recent antibiotics. She denies any fevers, nausea, vomiting, diarrhea, or chest pain.  Review of Systems:  Otherwise negative  Past Medical History: Past Medical History  Diagnosis Date  . Fibromyalgia   . Asthma   . DJD (degenerative joint disease)   . Chronic pain   . Depression   . Anxiety   . Panic attack   . Sleep disorder   . DJD (degenerative joint disease)    Past Surgical History  Procedure Date  . Abdominal surgery   . Back surgery   . Carpal tunnel release   . Ankle surgery   . Knee surgery   . Cesarean section   . Tonsillectomy   . Tubal ligation     Medications: Prior to Admission medications   Medication Sig Start Date End Date Taking? Authorizing Provider  albuterol (PROVENTIL) (2.5 MG/3ML) 0.083% nebulizer solution Take 2.5 mg by nebulization every 4 (four) hours as needed. For shortness of breath   Yes Historical Provider, MD  budesonide-formoterol (SYMBICORT) 160-4.5 MCG/ACT inhaler Take 2 puffs first thing in am and then another 2 puffs about 12 hours later.    06/01/11 05/31/12 Yes Sandrea Hughs, MD  chlorpheniramine-HYDROcodone (TUSSIONEX) 10-8 MG/5ML LQCR Take 5 mLs by mouth every 12 (twelve) hours.     Yes Historical Provider, MD  cyclobenzaprine (FLEXERIL) 10 MG tablet Take 10 mg by mouth at bedtime. Up to twice daily for sleep and muscle relaxation   Yes Historical Provider, MD  diazepam (VALIUM) 5 MG tablet Take 5 mg  by mouth 2 (two) times daily as needed. For nerves   Yes Historical Provider, MD  diclofenac sodium (VOLTAREN) 1 % GEL Apply 1 application topically 4 (four) times daily as needed. To knee for pain   Yes Historical Provider, MD  DULoxetine (CYMBALTA) 60 MG capsule Take 60 mg by mouth daily.     Yes Historical Provider, MD  famotidine (PEPCID) 20 MG tablet Take 20 mg by mouth at bedtime. One at bedtime  06/01/11 05/31/12 Yes Sandrea Hughs, MD  fluticasone [facility]) 50 MCG/ACT nasal spray Place 2 sprays into the nose 5 (five) times daily.    Yes Historical Provider, MD  HYDROcodone-acetaminophen (LORTAB) 10-500 MG per tablet Take 1 tablet by mouth every 6 (six) hours as needed. For pain 05/23/11  Yes Nimish C Gosrani  levalbuterol (XOPENEX HFA) 45 MCG/ACT inhaler Inhale 1-2 puffs into the lungs every 4 (four) hours as needed. For shortness of breath   Yes Historical Provider, MD  lidocaine (LIDODERM) 5 % Place 1 patch onto the skin daily. As needed for pain: Remove & Discard patch within 12 hours or as directed by MD   Yes Historical Provider, MD  montelukast (SINGULAIR) 10 MG tablet Take 10 mg by mouth every morning.    Yes Historical Provider, MD  nystatin (MYCOSTATIN) 100000 UNIT/ML suspension Take 5 mLs by mouth Once daily as needed. For thrush 05/28/11  Yes Historical  Provider, MD  olopatadine (PATANOL) 0.1 % ophthalmic solution Place 1 drop into both eyes 2 (two) times daily.     Yes Historical Provider, MD  omeprazole (PRILOSEC) 40 MG capsule Take 1 capsule (40 mg total) by mouth daily. 06/01/11 05/31/12 Yes Sandrea Hughs, MD  pregabalin (LYRICA) 150 MG capsule Take 150 mg by mouth 2 (two) times daily.     Yes Historical Provider, MD  tiotropium (SPIRIVA HANDIHALER) 18 MCG inhalation capsule Place 1 capsule (18 mcg total) into inhaler and inhale daily. 06/01/11 05/31/12 Yes Sandrea Hughs, MD    Allergies:   Allergies  Allergen Reactions  . Penicillins Shortness Of Breath and Swelling  . Sulfa  Antibiotics Shortness Of Breath and Swelling    Social History:  reports that she has been smoking Cigarettes.  She has a 8 pack-year smoking history. She has never used smokeless tobacco. She reports that she drinks alcohol. She reports that she does not use illicit drugs.  Family History: Family History  Problem Relation Age of Onset  . Cancer Mother     beast    Physical Exam: Filed Vitals:   08/09/11 1706 08/09/11 1950 08/09/11 2035 08/09/11 2051  BP:  117/66  123/82  Pulse:  96  93  Temp:   97.7 F (36.5 C) 97.7 F (36.5 C)  TempSrc:   Oral Oral  Resp:  20  21  Height:   5\' 4"  (1.626 m) 5\' 4"  (1.626 m)  Weight:   94.7 kg (208 lb 12.4 oz) 94.7 kg (208 lb 12.4 oz)  SpO2: 96% 93%  93%   General appearance: alert, cooperative and no distress Resp: wheezes bibasilar Cardio: regular rate and rhythm, S1, S2 normal, no murmur, click, rub or gallop GI: soft, non-tender; bowel sounds normal; no masses,  no organomegaly Extremities: extremities normal, atraumatic, no cyanosis or edema Pulses: 2+ and symmetric Skin: Skin color, texture, turgor normal. No rashes or lesions Neurologic: Grossly normal   Labs on Admission:   [facility] 08/09/11 1629  NA 141  K 3.7  CL 97  CO2 38*  GLUCOSE 116*  BUN 4*  CREATININE 0.53  CALCIUM 9.3  MG --  PHOS --    Basename 08/09/11 1629  WBC 6.3  NEUTROABS 5.1  HGB 13.7  HCT 44.5  MCV 92.7  PLT 171     Radiological Exams on Admission: Dg Chest Portable 1 View  08/09/2011  *RADIOLOGY REPORT*  Clinical Data: Shortness of breath.  Wheezing.  Productive cough.  PORTABLE CHEST - 1 VIEW  Comparison: 05/19/2011 and 07/25/2010.  Findings: Cardiomegaly.  Central pulmonary vascular prominence may explain the prominence of the right infrahilar region.  Follow-up two-view chest may prove helpful for further delineation.  No segmental infiltrate or gross pneumothorax.  Calcified aorta.  IMPRESSION: Cardiomegaly.  Central pulmonary vascular  prominence.  Please see above discussion.  Original Report Authenticated By: Fuller Canada, M.D.    Assessment/Plan Present on Admission:  55 year old female with acute asthma exacerbation  .COPD (chronic obstructive pulmonary disease)/asthma we'll place the patient on IV Solu-Medrol going to repeat an hour nebulizer treatment right now. Provide oxygen as needed and cover with antibiotics. Placed in stepped-down overnight. Patient is already feels better after the treatment she's received in the emergency department.  .Acute respiratory failure with hypercapnia .Chronic pain disorder .Obesity Tobacco abuse advised to quit  Caitlin Hillmer A 08/09/2011, 9:23 PM

## 2011-08-09 NOTE — ED Notes (Signed)
Spoke with Dr. Onalee Hua regarding admission orders to get patient upstairs.  Dr. Onalee Hua states patient doesn't need orders, just a bed request to get moved upstairs.

## 2011-08-09 NOTE — ED Provider Notes (Signed)
History     CSN: [id number] Arrival date & time: 08/09/2011  3:44 PM   First MD Initiated Contact with Patient 08/09/11 1544      Chief Complaint  Patient presents with  . Shortness of Breath    (Consider location/radiation/quality/duration/timing/severity/associated sxs/prior treatment) HPI Comments: Patient is brought in by EMS from home G2 worsening shortness of breath likely related to asthma. The patient has a history of significant asthma and is also compounded by her current smoking. She reports she has been intubated and hospitalized in the past with severe asthma. She reports she's had waxing and waning shortness of breath since around Thanksgiving which was about 3 weeks ago. She's been using her nebulizer at home to try to keep her symptoms under control. She reports she has developed a significant cough with some yellowish sputum production. She did have her influenza vaccination this year. Primary care physician is Dr. Christell Constant at [facility]. She denies any nausea vomiting. She has had some chills but unknown as far as fevers. She has had about 4 or 5 nebulizer treatments at home today and was given another one by EMS. The patient denied receiving any steroid to the IV by EMS, however at the patient's nurse received report from EMS stating that she did receive Solu-Medrol. A level V caveat applies due to the patient's significant shortness of breath and urgent need for intervention.  The history is provided by the patient.    Past Medical History  Diagnosis Date  . Fibromyalgia   . Asthma   . DJD (degenerative joint disease)   . Chronic pain   . Depression   . Anxiety   . Panic attack   . Sleep disorder   . DJD (degenerative joint disease)     Past Surgical History  Procedure Date  . Abdominal surgery   . Back surgery   . Carpal tunnel release   . Ankle surgery   . Knee surgery   . Cesarean section   . Tonsillectomy   . Tubal ligation      Family History  Problem Relation Age of Onset  . Cancer Mother     beast    History  Substance Use Topics  . Smoking status: Current Everyday Smoker -- 1.0 packs/day for 8 years    Types: Cigarettes  . Smokeless tobacco: Never Used  . Alcohol Use: Yes     occ    OB History    Grav Para Term Preterm Abortions TAB SAB Ect Mult Living   3 2 2  1  1   2       Review of Systems  Unable to perform ROS: Other    Allergies  Penicillins and Sulfa antibiotics  Home Medications   Current Outpatient Rx  Name Route Sig Dispense Refill  . ALBUTEROL SULFATE (2.5 MG/3ML) 0.083% IN NEBU Nebulization Take 2.5 mg by nebulization every 4 (four) hours as needed. For shortness of breath    . BUDESONIDE-FORMOTEROL FUMARATE 160-4.5 MCG/ACT IN AERO  Take 2 puffs first thing in am and then another 2 puffs about 12 hours later.    1 Inhaler 12  . HYDROCOD POLST-CHLORPHEN POLST 10-8 MG/5ML PO LQCR Oral Take 5 mLs by mouth every 12 (twelve) hours.      . CYCLOBENZAPRINE HCL 10 MG PO TABS Oral Take 10 mg by mouth at bedtime. Up to twice daily for sleep and muscle relaxation    . DIAZEPAM 5 MG PO TABS Oral  Take 5 mg by mouth 2 (two) times daily as needed. For nerves    . DICLOFENAC SODIUM 1 % TD GEL Topical Apply 1 application topically 4 (four) times daily as needed. To knee for pain    . DULOXETINE HCL 60 MG PO CPEP Oral Take 60 mg by mouth daily.      Marland Kitchen FAMOTIDINE 20 MG PO TABS Oral Take 20 mg by mouth at bedtime. One at bedtime     . FLUTICASONE PROPIONATE 50 MCG/ACT NA SUSP Nasal Place 2 sprays into the nose 5 (five) times daily.     Marland Kitchen HYDROCODONE-ACETAMINOPHEN 10-500 MG PO TABS Oral Take 1 tablet by mouth every 6 (six) hours as needed. For pain 30 tablet 0  . LEVALBUTEROL TARTRATE 45 MCG/ACT IN AERO Inhalation Inhale 1-2 puffs into the lungs every 4 (four) hours as needed. For shortness of breath    . LIDOCAINE 5 % EX PTCH Transdermal Place 1 patch onto the skin daily. As needed for pain:  Remove & Discard patch within 12 hours or as directed by MD    . MONTELUKAST SODIUM 10 MG PO TABS Oral Take 10 mg by mouth every morning.     Marland Kitchen NYSTATIN 100000 UNIT/ML MT SUSP Oral Take 5 mLs by mouth Once daily as needed. For thrush    . OLOPATADINE HCL 0.1 % OP SOLN Both Eyes Place 1 drop into both eyes 2 (two) times daily.      Marland Kitchen OMEPRAZOLE 40 MG PO CPDR Oral Take 1 capsule (40 mg total) by mouth daily. 30 capsule 11  . PREGABALIN 150 MG PO CAPS Oral Take 150 mg by mouth 2 (two) times daily.      Marland Kitchen TIOTROPIUM BROMIDE MONOHYDRATE 18 MCG IN CAPS Inhalation Place 1 capsule (18 mcg total) into inhaler and inhale daily. 30 capsule 2    BP 140/86  Pulse 99  Temp 98.3 F (36.8 C)  Resp 28  Ht 5\' 4"  (1.626 m)  Wt 200 lb (90.719 kg)  BMI 34.33 kg/m2  SpO2 96%  Physical Exam  Nursing note and vitals reviewed. Constitutional: She is oriented to person, place, and time. She appears well-developed and well-nourished. She appears distressed.  HENT:  Head: Normocephalic and atraumatic.  Eyes: Pupils are equal, round, and reactive to light.  Neck: Neck supple.  Cardiovascular: Normal rate and regular rhythm.   Pulmonary/Chest: She is in respiratory distress. She has wheezes. She has no rales. She exhibits tenderness.  Abdominal: Soft. Bowel sounds are normal.  Neurological: She is alert and oriented to person, place, and time.  Skin: Skin is warm and dry.    ED Course  Procedures (including critical care time) CRITICAL CARE Performed by: Lear Ng.   Total critical care time: 30 min  Critical care time was exclusive of separately billable procedures and treating other patients.  Critical care was necessary to treat or prevent imminent or life-threatening deterioration.  Critical care was time spent personally by me on the following activities: development of treatment plan with patient and/or surrogate as well as nursing, discussions with consultants, evaluation of patient's  response to treatment, examination of patient, obtaining history from patient or surrogate, ordering and performing treatments and interventions, ordering and review of laboratory studies, ordering and review of radiographic studies, pulse oximetry and re-evaluation of patient's condition.   Labs Reviewed  CBC - Abnormal; Notable for the following:    RDW 16.6 (*)    All other components within normal limits  DIFFERENTIAL -  Abnormal; Notable for the following:    Neutrophils Relative 82 (*)    All other components within normal limits  BASIC METABOLIC PANEL - Abnormal; Notable for the following:    CO2 38 (*)    Glucose, Bld 116 (*)    BUN 4 (*)    All other components within normal limits  POCT I-STAT TROPONIN I  I-STAT TROPONIN I   Dg Chest Portable 1 View  08/09/2011  *RADIOLOGY REPORT*  Clinical Data: Shortness of breath.  Wheezing.  Productive cough.  PORTABLE CHEST - 1 VIEW  Comparison: 05/19/2011 and 07/25/2010.  Findings: Cardiomegaly.  Central pulmonary vascular prominence may explain the prominence of the right infrahilar region.  Follow-up two-view chest may prove helpful for further delineation.  No segmental infiltrate or gross pneumothorax.  Calcified aorta.  IMPRESSION: Cardiomegaly.  Central pulmonary vascular prominence.  Please see above discussion.  Original Report Authenticated By: Fuller Canada, M.D.     No diagnosis found.   Patient's current saturation of 96% on nasal cannula oxygen is likely normal. Her EKG at time 1554. Heart rate is 100 which is sinus tachycardia, borderline low QRS voltages. QRS duration is 118 ms suggestive of incomplete right bundle branch block. There is inversion of T waves in anterolateral leads as well as in lead 3. The Q waves seen in lead 3 was seen previously. The T-wave inversions however are new since her EKG on 07/25/2010. MDM    Patient with significant wheezing and coughing on exam. She does have a slightly changed an abnormal  EKG compared to her prior. However her clinical picture suggests more of a respiratory etiology due to her history of asthma and physical exam. My plan is to administer a hour-long continuous nebulizer treatment. I'll obtain a chest x-ray to exclude possible pneumonia. However given her prolonged and worsening symptoms, as well as her history of prior intubation, all likely treat her with IV antibiotics. I feel given the prolonged nature of her symptoms as well as degree of her symptoms she should be admitted for continued treatment with nebulizer treatments, steroids and antibiotics.   5:58 PM Pt is feeling some improvement.  Troponin i was normal. Dr. Chandra Batch will admit, requests step down bed for now.  She requests that an ABG be done to help determine prognosis and what level of care pt will need in immediate future.      Gavin Pound. Oletta Lamas, MD 08/09/11 1610

## 2011-08-09 NOTE — ED Notes (Signed)
Solu-medrol given in route to ED by EMS. Dr Oletta Lamas made aware and medication discontinued.

## 2011-08-09 NOTE — ED Notes (Signed)
Patient brought in via EMS for shortness of breath. Patient alert and oriented. Airway patent. Per EMS patient very short of breath with audible wheezing upon there arrival. Per EMS albuterol neb x1, duo-neb x1, and solumedrol 125mg  given. O2 sat 96% on 6 liters. Resp paged for breathing treatment.

## 2011-08-10 DIAGNOSIS — I517 Cardiomegaly: Secondary | ICD-10-CM

## 2011-08-10 LAB — BASIC METABOLIC PANEL
CO2: 37 mEq/L — ABNORMAL HIGH (ref 19–32)
Chloride: 97 mEq/L (ref 96–112)
Creatinine, Ser: 0.53 mg/dL (ref 0.50–1.10)
GFR calc Af Amer: >90 mL/min (ref >90)
Potassium: 4 mEq/L (ref 3.5–5.1)

## 2011-08-10 LAB — CBC
HCT: 46.4 % — ABNORMAL HIGH (ref 36.0–46.0)
Hemoglobin: 14.2 g/dL (ref 12.0–15.0)
MCV: 93.5 fL (ref 78.0–100.0)
RBC: 4.96 MIL/uL (ref 3.87–5.11)
RDW: 16.7 % — ABNORMAL HIGH (ref 11.5–15.5)
WBC: 4.8 10*3/uL (ref 4.0–10.5)

## 2011-08-10 MED ORDER — IPRATROPIUM BROMIDE 0.02 % IN SOLN
0.5000 mg | Freq: Four times a day (QID) | RESPIRATORY_TRACT | Status: DC
  Administered 2011-08-10 – 2011-08-11 (×5): 0.5 mg via RESPIRATORY_TRACT
  Filled 2011-08-10 (×5): qty 2.5

## 2011-08-10 MED ORDER — IPRATROPIUM BROMIDE 0.02 % IN SOLN
RESPIRATORY_TRACT | Status: AC
  Filled 2011-08-10: qty 2.5

## 2011-08-10 MED ORDER — GUAIFENESIN-CODEINE 100-10 MG/5ML PO SOLN
10.0000 mL | ORAL | Status: DC
  Administered 2011-08-10 – 2011-08-11 (×6): 10 mL via ORAL
  Filled 2011-08-10 (×7): qty 10

## 2011-08-10 MED ORDER — MOXIFLOXACIN HCL 400 MG PO TABS
400.0000 mg | ORAL_TABLET | Freq: Every day | ORAL | Status: DC
  Administered 2011-08-10: 400 mg via ORAL
  Filled 2011-08-10: qty 1

## 2011-08-10 MED ORDER — CHLORHEXIDINE GLUCONATE CLOTH 2 % EX PADS
6.0000 | MEDICATED_PAD | Freq: Every day | CUTANEOUS | Status: DC
  Administered 2011-08-10 – 2011-08-11 (×2): 6 via TOPICAL

## 2011-08-10 MED ORDER — DIAZEPAM 5 MG PO TABS: 5.0000 mg | ORAL_TABLET | Freq: Two times a day (BID) | ORAL | Status: DC | PRN

## 2011-08-10 MED ORDER — PREDNISONE 20 MG PO TABS
40.0000 mg | ORAL_TABLET | Freq: Every day | ORAL | Status: DC
  Administered 2011-08-10 – 2011-08-11 (×2): 40 mg via ORAL
  Filled 2011-08-10 (×2): qty 2

## 2011-08-10 MED ORDER — MUPIROCIN 2 % EX OINT
1.0000 "application " | TOPICAL_OINTMENT | Freq: Two times a day (BID) | CUTANEOUS | Status: DC
  Administered 2011-08-10 – 2011-08-11 (×3): 1 via NASAL
  Filled 2011-08-10: qty 22

## 2011-08-10 MED ORDER — HYDROCODONE-ACETAMINOPHEN 10-325 MG PO TABS
1.0000 | ORAL_TABLET | ORAL | Status: DC | PRN
  Administered 2011-08-10 – 2011-08-11 (×5): 1 via ORAL
  Filled 2011-08-10 (×5): qty 1

## 2011-08-10 NOTE — Progress Notes (Signed)
Subjective: This lady presents again with an exacerbation of her COPD/asthma. She does continue to smoke but now she says that she is down to one pack per day. She is currently bothered by persistent coughing which is largely nonproductive. Her chest x-ray does show cardiomegaly. She is obese.           Physical Exam: Blood pressure 139/84, pulse 84, temperature 98.1 F (36.7 C), temperature source Oral, resp. rate 23, height 5\' 4"  (1.626 m), weight 104.5 kg (230 lb 6.1 oz), SpO2 95.00%. There is no significant increased work of breathing. There is no peripheral central cyanosis. In fact, her lungs are entirely clear this morning! There is no wheezing, crackles or bronchial breathing. Heart sounds are present and normal without murmurs. She is alert and orientated.   Investigations:  Recent Results (from the past 240 hour(s))  MRSA PCR SCREENING     Status: Abnormal   Collection Time   08/09/11 11:23 PM      Component Value Range Status Comment   MRSA by PCR POSITIVE (*) NEGATIVE  Final      Basic Metabolic Panel:  Basename 08/10/11 0522 08/09/11 1629  NA 140 141  K 4.0 3.7  CL 97 97  CO2 37* 38*  GLUCOSE 135* 116*  BUN 8 4*  CREATININE 0.53 0.53  CALCIUM 9.6 9.3  MG -- --  PHOS -- --       CBC:  Basename 08/10/11 0522 08/09/11 1629  WBC 4.8 6.3  NEUTROABS -- 5.1  HGB 14.2 13.7  HCT 46.4* 44.5  MCV 93.5 92.7  PLT 170 171    Dg Chest Portable 1 View  08/09/2011  *RADIOLOGY REPORT*  Clinical Data: Shortness of breath.  Wheezing.  Productive cough.  PORTABLE CHEST - 1 VIEW  Comparison: 05/19/2011 and 07/25/2010.  Findings: Cardiomegaly.  Central pulmonary vascular prominence may explain the prominence of the right infrahilar region.  Follow-up two-view chest may prove helpful for further delineation.  No segmental infiltrate or gross pneumothorax.  Calcified aorta.  IMPRESSION: Cardiomegaly.  Central pulmonary vascular prominence.  Please see above discussion.   Original Report Authenticated By: Fuller Canada, M.D.      Medications: I have reviewed the patient's current medications.  Impression: 1. Exacerbation of COPD/asthma. 2. Chronic tobacco abuse. 3. Chronic pain disorder in her low back, on opioids. 4. Obesity.     Plan: 1. Discontinue intravenous steroids and start oral steroids. 2. Robitussin with codeine for cough. 3. Echocardiogram to assess if there is any element of diastolic dysfunction. She did have an echocardiogram in 2011 which showed grade 1 diastolic dysfunction. 4. Move to regular floor.     LOS: 1 day   Chanelle Hodsdon C 08/10/2011, 8:34 AM

## 2011-08-10 NOTE — Progress Notes (Signed)
UR Chart Review Completed  

## 2011-08-10 NOTE — Progress Notes (Signed)
*  PRELIMINARY RESULTS* Echocardiogram 2D Echocardiogram has been performed.  Bethany Villa 08/10/2011, 4:24 PM

## 2011-08-11 MED ORDER — LEVOFLOXACIN IN D5W 500 MG/100ML IV SOLN
INTRAVENOUS | Status: AC
  Filled 2011-08-11: qty 100

## 2011-08-11 MED ORDER — PREDNISONE 20 MG PO TABS: ORAL_TABLET | ORAL | Status: DC

## 2011-08-11 MED ORDER — MOXIFLOXACIN HCL 400 MG PO TABS: 400.0000 mg | ORAL_TABLET | Freq: Every day | ORAL | Status: AC

## 2011-08-11 NOTE — Progress Notes (Signed)
    CARE MANAGEMENT NOTE 08/11/2011  Patient:  Bethany Villa, Bethany Villa   Account Number:  [id number]  Date Initiated:  08/11/2011  Documentation initiated by:  Mendel Corning  Subjective/Objective Assessment:   55 yr old female with  ex of asthma lives alone  on medicare disability independent of adls     Action/Plan:   Anticipated DC Date:  08/11/2011   Anticipated DC Plan:  HOME/SELF CARE      DC Planning Services  CM consult      PAC Choice  DURABLE MEDICAL EQUIPMENT   Choice offered to / List presented to:  C-1 Patient   DME arranged  [facility]      DME agency  [facility]        Status of service:   Medicare Important Message given?   (If response is "NO", the following Medicare IM given date fields will be blank) Date Medicare IM given:   Date Additional Medicare IM given:    Discharge Disposition:  HOME/SELF CARE  Per UR Regulation:    Comments:  08/11/11 Mendel Corning rn bsn pt o2 sat dropped to 87% home o2 2l prn ordered by md.above arrangements made for o2. portable tank to be brought to the hospital for use on the way home.

## 2011-08-11 NOTE — Progress Notes (Signed)
Checked patients oxygen saturation on room air and was 87%. Got patient to cough and she coughed up large amount of sputum and then maintained oxygen at 94% on room air. MD aware. Will prepare patient for discharge

## 2011-08-11 NOTE — Progress Notes (Signed)
Pt discharged home via family; Pt and family given and explained all discharge instructions, carenotes, and prescriptions; pt and family stated understanding and denied questions/concerns; all f/u appointments in place; IV removed without complicaitons; pt stable at time of discharge  

## 2011-08-11 NOTE — Progress Notes (Signed)
Rechecked patients oxygen on room air and patient was 87-88% on room air. Patient encouraged to cough and deep breath. Pt will benefit from prn oxygen at home. MD and case manager aware.

## 2011-08-11 NOTE — Discharge Summary (Signed)
Physician Discharge Summary  Patient ID: Bethany Villa MRN: [id number] DOB/AGE: 54-Dec-1957 54 y.o. Primary Care Physician:MOORE,DONALD Andrey Campanile, MD, MD Admit date: 08/09/2011 Discharge date: 08/11/2011    Discharge Diagnoses:  1. Exacerbation of COPD. 2. Tobacco abuse. 3. Obesity. 4. Chronic pain disorder. 5. Grade 1 diastolic dysfunction.   Current Discharge Medication List    START taking these medications   Details  moxifloxacin (AVELOX) 400 MG tablet Take 1 tablet (400 mg total) by mouth daily at 6 PM. Qty: 5 tablet, Refills: 0    predniSONE (DELTASONE) 20 MG tablet Take 2 tabs daily for 3 days,then 1 tab daily for 3 days,then 1/2 tab daily for 3 days,then STOP. Qty: 12 tablet, Refills: 0      CONTINUE these medications which have NOT CHANGED   Details  albuterol (PROVENTIL) (2.5 MG/3ML) 0.083% nebulizer solution Take 2.5 mg by nebulization every 4 (four) hours as needed. For shortness of breath    budesonide-formoterol (SYMBICORT) 160-4.5 MCG/ACT inhaler Take 2 puffs first thing in am and then another 2 puffs about 12 hours later.    Qty: 1 Inhaler, Refills: 12    chlorpheniramine-HYDROcodone (TUSSIONEX) 10-8 MG/5ML LQCR Take 5 mLs by mouth every 12 (twelve) hours.      cyclobenzaprine (FLEXERIL) 10 MG tablet Take 10 mg by mouth at bedtime. Up to twice daily for sleep and muscle relaxation    diazepam (VALIUM) 5 MG tablet Take 5 mg by mouth 2 (two) times daily as needed. For nerves    diclofenac sodium (VOLTAREN) 1 % GEL Apply 1 application topically 4 (four) times daily as needed. To knee for pain    DULoxetine (CYMBALTA) 60 MG capsule Take 60 mg by mouth daily.      famotidine (PEPCID) 20 MG tablet Take 20 mg by mouth at bedtime. One at bedtime     fluticasone (FLONASE) 50 MCG/ACT nasal spray Place 2 sprays into the nose 5 (five) times daily.     HYDROcodone-acetaminophen (LORTAB) 10-500 MG per tablet Take 1 tablet by mouth every 6 (six) hours as needed. For  pain Qty: 30 tablet, Refills: 0    levalbuterol (XOPENEX HFA) 45 MCG/ACT inhaler Inhale 1-2 puffs into the lungs every 4 (four) hours as needed. For shortness of breath    lidocaine (LIDODERM) 5 % Place 1 patch onto the skin daily. As needed for pain: Remove & Discard patch within 12 hours or as directed by MD    montelukast (SINGULAIR) 10 MG tablet Take 10 mg by mouth every morning.     nystatin (MYCOSTATIN) 100000 UNIT/ML suspension Take 5 mLs by mouth Once daily as needed. For thrush    olopatadine (PATANOL) 0.1 % ophthalmic solution Place 1 drop into both eyes 2 (two) times daily.      omeprazole (PRILOSEC) 40 MG capsule Take 1 capsule (40 mg total) by mouth daily. Qty: 30 capsule, Refills: 11    pregabalin (LYRICA) 150 MG capsule Take 150 mg by mouth 2 (two) times daily.      tiotropium (SPIRIVA HANDIHALER) 18 MCG inhalation capsule Place 1 capsule (18 mcg total) into inhaler and inhale daily. Qty: 30 capsule, Refills: 2        Discharged Condition: Improved and stable.    Consults: None.  Significant Diagnostic Studies: Dg Chest Portable 1 View  08/09/2011  *RADIOLOGY REPORT*  Clinical Data: Shortness of breath.  Wheezing.  Productive cough.  PORTABLE CHEST - 1 VIEW  Comparison: 05/19/2011 and 07/25/2010.  Findings: Cardiomegaly.  Central pulmonary  vascular prominence may explain the prominence of the right infrahilar region.  Follow-up two-view chest may prove helpful for further delineation.  No segmental infiltrate or gross pneumothorax.  Calcified aorta.  IMPRESSION: Cardiomegaly.  Central pulmonary vascular prominence.  Please see above discussion.  Original Report Authenticated By: Fuller Canada, M.D.    Lab Results: Basic Metabolic Panel:  Basename 08/10/11 0522 08/09/11 1629  NA 140 141  K 4.0 3.7  CL 97 97  CO2 37* 38*  GLUCOSE 135* 116*  BUN 8 4*  CREATININE 0.53 0.53  CALCIUM 9.6 9.3  MG -- --  PHOS -- --       CBC:  Basename 08/10/11 0522  08/09/11 1629  WBC 4.8 6.3  NEUTROABS -- 5.1  HGB 14.2 13.7  HCT 46.4* 44.5  MCV 93.5 92.7  PLT 170 171    Recent Results (from the past 240 hour(s))  MRSA PCR SCREENING     Status: Abnormal   Collection Time   08/09/11 11:23 PM      Component Value Range Status Comment   MRSA by PCR POSITIVE (*) NEGATIVE  Final      Hospital Course: This 55 year old lady was admitted with dyspnea for approximately one week prior to admission. She did have a history of smoking 2 packs of cigarettes per day has improved to 1 pack a day. When she was admitted to the hospital she was acutely short of breath and she was treated with intravenous steroids and antibiotics. She made a good improvement with this. There is no evidence of pneumonia on chest x-ray or clinically. She did have evidence of possible pulmonary vascular congestion and therefore an echocardiogram was done. This showed grade 1 diastolic dysfunction but I'm not convinced that this was enough to cause any heart failure. Clinically she did not have heart failure. She was converted to oral steroids and antibiotics. She has done well and has been able to maintain saturation above 92% on room air after she does cough and clear her airways. She feels comfortable that she can do this now. I've counseled her about tobacco cessation and also weight loss. She weighs 230 pounds and clearly is obese. She understands the need to lose weight and we discussed strategies of how to do this with emphasis on good low glycemic nutrition.  Discharge Exam: Blood pressure 114/78, pulse 91, temperature 97.8 F (36.6 C), temperature source Oral, resp. rate 18, height 5\' 4"  (1.626 m), weight 104.5 kg (230 lb 6.1 oz), SpO2 95.00%. She looks systemically well. There is no peripheral central cyanosis. There is no increased work of breathing.. Lung fields show bilateral wheezing which I believe is her baseline state. There is no peripheral or central cyanosis. Heart sounds are  present and normal. Jugular venous pressure not raised. She is alert and orientated.  Disposition: Home. She will follow with her primary care physician in due course. She is being sent home on a tapering course of steroids and antibiotics.  Discharge Orders    Future Orders Please Complete By Expires   Diet - low sodium heart healthy      Increase activity slowly         Follow-up Information    Follow up with Monica Becton, MD .         SignedWilson Singer Pager [phone]  08/11/2011, 10:10 AM

## 2011-08-26 ENCOUNTER — Encounter (HOSPITAL_COMMUNITY): Payer: Self-pay | Admitting: *Deleted

## 2011-08-26 DIAGNOSIS — M1990 Unspecified osteoarthritis, unspecified site: Secondary | ICD-10-CM | POA: Insufficient documentation

## 2011-08-26 DIAGNOSIS — F329 Major depressive disorder, single episode, unspecified: Secondary | ICD-10-CM | POA: Insufficient documentation

## 2011-08-26 DIAGNOSIS — R109 Unspecified abdominal pain: Secondary | ICD-10-CM | POA: Insufficient documentation

## 2011-08-26 DIAGNOSIS — N12 Tubulo-interstitial nephritis, not specified as acute or chronic: Secondary | ICD-10-CM | POA: Insufficient documentation

## 2011-08-26 DIAGNOSIS — F410 Panic disorder [episodic paroxysmal anxiety] without agoraphobia: Secondary | ICD-10-CM | POA: Insufficient documentation

## 2011-08-26 DIAGNOSIS — F411 Generalized anxiety disorder: Secondary | ICD-10-CM | POA: Insufficient documentation

## 2011-08-26 DIAGNOSIS — F17200 Nicotine dependence, unspecified, uncomplicated: Secondary | ICD-10-CM | POA: Insufficient documentation

## 2011-08-26 DIAGNOSIS — J45909 Unspecified asthma, uncomplicated: Secondary | ICD-10-CM | POA: Insufficient documentation

## 2011-08-26 DIAGNOSIS — IMO0001 Reserved for inherently not codable concepts without codable children: Secondary | ICD-10-CM | POA: Insufficient documentation

## 2011-08-26 DIAGNOSIS — G8929 Other chronic pain: Secondary | ICD-10-CM | POA: Insufficient documentation

## 2011-08-26 DIAGNOSIS — F3289 Other specified depressive episodes: Secondary | ICD-10-CM | POA: Insufficient documentation

## 2011-08-26 NOTE — ED Notes (Signed)
Pain in right lower quadrant for 4 days

## 2011-08-27 ENCOUNTER — Emergency Department (HOSPITAL_COMMUNITY)
Admission: EM | Admit: 2011-08-27 | Discharge: 2011-08-27 | Disposition: A | Discharge status: Home / Self Care | Payer: Medicare Other | Attending: Emergency Medicine | Admitting: Emergency Medicine

## 2011-08-27 ENCOUNTER — Emergency Department (HOSPITAL_COMMUNITY): Payer: Medicare Other

## 2011-08-27 DIAGNOSIS — N12 Tubulo-interstitial nephritis, not specified as acute or chronic: Secondary | ICD-10-CM

## 2011-08-27 LAB — URINALYSIS, ROUTINE W REFLEX MICROSCOPIC
Glucose, UA: NEGATIVE mg/dL
Hgb urine dipstick: NEGATIVE
Nitrite: POSITIVE — AB
Specific Gravity, Urine: 1.025 (ref 1.005–1.030)
Urobilinogen, UA: 0.2 mg/dL (ref 0.0–1.0)
pH: 6 (ref 5.0–8.0)

## 2011-08-27 MED ORDER — SODIUM CHLORIDE 0.9 % IV SOLN
Freq: Once | INTRAVENOUS | Status: AC
  Administered 2011-08-27: 03:00:00 via INTRAVENOUS

## 2011-08-27 MED ORDER — HYDROMORPHONE HCL PF 1 MG/ML IJ SOLN
1.0000 mg | Freq: Once | INTRAMUSCULAR | Status: AC
  Administered 2011-08-27: 1 mg via INTRAVENOUS
  Filled 2011-08-27: qty 1

## 2011-08-27 MED ORDER — ONDANSETRON HCL 4 MG/2ML IJ SOLN
4.0000 mg | Freq: Once | INTRAMUSCULAR | Status: AC
  Administered 2011-08-27: 4 mg via INTRAVENOUS
  Filled 2011-08-27: qty 2

## 2011-08-27 MED ORDER — HYDROMORPHONE HCL 4 MG PO TABS: 4.0000 mg | ORAL_TABLET | ORAL | Status: AC | PRN

## 2011-08-27 MED ORDER — HYDROMORPHONE HCL PF 2 MG/ML IJ SOLN
INTRAMUSCULAR | Status: AC
  Administered 2011-08-27: 1 mg
  Filled 2011-08-27: qty 1

## 2011-08-27 MED ORDER — CIPROFLOXACIN HCL 500 MG PO TABS: 500.0000 mg | ORAL_TABLET | Freq: Two times a day (BID) | ORAL | Status: AC

## 2011-08-27 MED ORDER — KETOROLAC TROMETHAMINE 30 MG/ML IJ SOLN
30.0000 mg | Freq: Once | INTRAMUSCULAR | Status: AC
  Administered 2011-08-27: 30 mg via INTRAVENOUS
  Filled 2011-08-27: qty 1

## 2011-08-27 MED ORDER — HYDROMORPHONE HCL PF 1 MG/ML IJ SOLN: 1.0000 mg | Freq: Once | INTRAMUSCULAR | Status: AC

## 2011-08-27 MED ORDER — CIPROFLOXACIN IN D5W 400 MG/200ML IV SOLN
400.0000 mg | Freq: Once | INTRAVENOUS | Status: AC
  Administered 2011-08-27: 400 mg via INTRAVENOUS
  Filled 2011-08-27: qty 200

## 2011-08-27 MED ORDER — PROMETHAZINE HCL 25 MG PO TABS: 25.0000 mg | ORAL_TABLET | Freq: Four times a day (QID) | ORAL | Status: AC | PRN

## 2011-08-27 NOTE — ED Provider Notes (Signed)
History     CSN: [id number]  Arrival date & time 08/26/11  2114   First MD Initiated Contact with Patient 08/27/11 0211      Chief Complaint  Patient presents with  . Abdominal Pain    (Consider location/radiation/quality/duration/timing/severity/associated sxs/prior treatment) HPI This is a 55 year old white female with a five-day history of right flank pain radiating to the right lower quadrant. It is severe and unlike any previous pain. There is a colicky nature to it. This is accompanied by hematuria, nausea and vomiting. Nothing has relieved the pain.  Past Medical History  Diagnosis Date  . Fibromyalgia   . Asthma   . DJD (degenerative joint disease)   . Chronic pain   . Depression   . Anxiety   . Panic attack   . Sleep disorder   . DJD (degenerative joint disease)     Past Surgical History  Procedure Date  . Abdominal surgery   . Back surgery   . Carpal tunnel release   . Ankle surgery   . Knee surgery   . Cesarean section   . Tonsillectomy   . Tubal ligation     Family History  Problem Relation Age of Onset  . Cancer Mother     beast    History  Substance Use Topics  . Smoking status: Current Everyday Smoker -- 1.0 packs/day for 8 years    Types: Cigarettes  . Smokeless tobacco: Never Used  . Alcohol Use: Yes     occ    OB History    Grav Para Term Preterm Abortions TAB SAB Ect Mult Living   3 2 2  1  1   2       Review of Systems  All other systems reviewed and are negative.    Allergies  Penicillins and Sulfa antibiotics  Home Medications   Current Outpatient Rx  Name Route Sig Dispense Refill  . ALBUTEROL SULFATE (2.5 MG/3ML) 0.083% IN NEBU Nebulization Take 2.5 mg by nebulization every 4 (four) hours as needed. For shortness of breath    . BUDESONIDE-FORMOTEROL FUMARATE 160-4.5 MCG/ACT IN AERO  Take 2 puffs first thing in am and then another 2 puffs about 12 hours later.    1 Inhaler 12  . CYCLOBENZAPRINE HCL 10 MG PO TABS  Oral Take 10 mg by mouth at bedtime. Up to twice daily for sleep and muscle relaxation    . DIAZEPAM 5 MG PO TABS Oral Take 5 mg by mouth 2 (two) times daily as needed. For nerves    . DICLOFENAC SODIUM 1 % TD GEL Topical Apply 1 application topically 4 (four) times daily as needed. To knee for pain    . DULOXETINE HCL 60 MG PO CPEP Oral Take 60 mg by mouth daily.      Marland Kitchen FAMOTIDINE 20 MG PO TABS Oral Take 20 mg by mouth at bedtime. One at bedtime     . HYDROCODONE-ACETAMINOPHEN 10-500 MG PO TABS Oral Take 1 tablet by mouth every 6 (six) hours as needed. For pain 30 tablet 0  . LEVALBUTEROL TARTRATE 45 MCG/ACT IN AERO Inhalation Inhale 1-2 puffs into the lungs every 4 (four) hours as needed. For shortness of breath    . LIDOCAINE 5 % EX PTCH Transdermal Place 1 patch onto the skin daily. As needed for pain: Remove & Discard patch within 12 hours or as directed by MD    . MONTELUKAST SODIUM 10 MG PO TABS Oral Take 10 mg by mouth  every morning.     Marland Kitchen NYSTATIN 100000 UNIT/ML MT SUSP Oral Take 5 mLs by mouth Once daily as needed. For thrush    . OLOPATADINE HCL 0.1 % OP SOLN Both Eyes Place 1 drop into both eyes 2 (two) times daily.      Marland Kitchen TIOTROPIUM BROMIDE MONOHYDRATE 18 MCG IN CAPS Inhalation Place 1 capsule (18 mcg total) into inhaler and inhale daily. 30 capsule 2  . ZOLPIDEM TARTRATE 10 MG PO TABS Oral Take 10 mg by mouth at bedtime as needed. For sleep     . OMEPRAZOLE 40 MG PO CPDR Oral Take 1 capsule (40 mg total) by mouth daily. 30 capsule 11    BP 143/97  Pulse 118  Temp(Src) 99.2 F (37.3 C) (Oral)  Resp 18  SpO2 96%  Physical Exam General: Well-developed, well-nourished female in no acute distress; appearance consistent with age of record; appears uncomfortable HENT: normocephalic, atraumatic Eyes: normal appearance Neck: supple Heart: regular rate and rhythm Lungs: clear to auscultation bilaterally Abdomen: soft; mild right lower quadrant tenderness; obese GU: moderate right  CVA tenderness; voided urine grossly bloody Extremities: No deformity; full range of motion Neurologic: Awake, alert and oriented; motor function intact in all extremities and symmetric; no facial droop Skin: Warm and dry Psychiatric: anxious    ED Course  Procedures (including critical care time)    MDM   Nursing notes and vitals signs, including pulse oximetry, reviewed.  Summary of this visit's results, reviewed by myself:  Labs:  Results for orders placed during the hospital encounter of 08/27/11  URINALYSIS, ROUTINE W REFLEX MICROSCOPIC      Component Value Range   Color, Urine AMBER (*) YELLOW    APPearance CLOUDY (*) CLEAR    Specific Gravity, Urine 1.025  1.005 - 1.030    pH 6.0  5.0 - 8.0    Glucose, UA NEGATIVE  NEGATIVE (mg/dL)   Hgb urine dipstick NEGATIVE  NEGATIVE    Bilirubin Urine MODERATE (*) NEGATIVE    Ketones, ur TRACE (*) NEGATIVE (mg/dL)   Protein, ur 161 (*) NEGATIVE (mg/dL)   Urobilinogen, UA 0.2  0.0 - 1.0 (mg/dL)   Nitrite POSITIVE (*) NEGATIVE    Leukocytes, UA TRACE (*) NEGATIVE   URINE MICROSCOPIC-ADD ON      Component Value Range   Squamous Epithelial / LPF MANY (*) RARE    WBC, UA 7-10  <3 (WBC/hpf)   RBC / HPF 3-6  <3 (RBC/hpf)   Bacteria, UA MANY (*) RARE    Crystals CA OXALATE CRYSTALS (*) NEGATIVE     Imaging Studies: Ct Abdomen Pelvis Wo Contrast  08/27/2011  *RADIOLOGY REPORT*  Clinical Data: Right flank pain and hematuria.  CT ABDOMEN AND PELVIS WITHOUT CONTRAST  Technique:  Multidetector CT imaging of the abdomen and pelvis was performed following the standard protocol without intravenous contrast.  Comparison: None.  Findings: The visualized lung bases are clear.  The liver and spleen are unremarkable in appearance.  The gallbladder is within normal limits.  The pancreas and adrenal glands are unremarkable.  The kidneys are unremarkable in appearance.  There is no evidence of hydronephrosis.  No renal or ureteral stones are seen.   No perinephric stranding is appreciated.  No free fluid is identified.  The small bowel is unremarkable in appearance.  The stomach is within normal limits.  A small focus of calcification adjacent to the gastric fundus may reflect a calcified lymph node.  No acute vascular abnormalities are seen.  Scattered calcification is noted along the abdominal aorta and its branches.  The appendix is normal in caliber, without evidence for appendicitis.  The colon is partially filled with fluid and is unremarkable in appearance.  There is mild redundancy of the sigmoid colon.  The bladder is mildly distended and grossly unremarkable in appearance.  The uterus is grossly unremarkable, though difficult to fully assess without contrast.  The ovaries appear grossly symmetric; no suspicious adnexal masses are seen.  No inguinal lymphadenopathy is seen.  No acute osseous abnormalities are identified.  Vacuum phenomenon and mild disc space narrowing are noted at L2-L3.  IMPRESSION:  1.  No acute abnormalities seen within the abdomen or pelvis. 2.  No evidence of hydronephrosis; no renal or ureteral stones seen. 3.  Question of calcified lymph node adjacent to the gastric fundus. 4.  Scattered calcification along the abdominal aorta and its branches.  Original Report Authenticated By: Tonia Ghent, M.D.   5:01 AM Pain well controlled with IV medications. CT findings were discussed with patient. Differential diagnosis includes passed stone with residual spasm versus pyelonephritis with consequent ureteral spasm. We'll treat for pyelonephritis.          Hanley Seamen, MD 08/27/11 [id number]

## 2011-08-29 LAB — URINE CULTURE
Colony Count: >=100000
Culture  Setup Time: 201212281932

## 2011-08-30 NOTE — ED Notes (Signed)
+   urine Patient treated with Cipro-resistant to same-chart sent to EDP office for review

## 2011-09-06 DIAGNOSIS — M549 Dorsalgia, unspecified: Secondary | ICD-10-CM | POA: Diagnosis not present

## 2011-09-06 DIAGNOSIS — R109 Unspecified abdominal pain: Secondary | ICD-10-CM | POA: Diagnosis not present

## 2011-09-06 DIAGNOSIS — J9819 Other pulmonary collapse: Secondary | ICD-10-CM | POA: Diagnosis not present

## 2011-09-06 DIAGNOSIS — R0602 Shortness of breath: Secondary | ICD-10-CM | POA: Diagnosis not present

## 2011-09-06 DIAGNOSIS — N159 Renal tubulo-interstitial disease, unspecified: Secondary | ICD-10-CM | POA: Diagnosis not present

## 2011-09-07 DIAGNOSIS — L0291 Cutaneous abscess, unspecified: Secondary | ICD-10-CM | POA: Diagnosis not present

## 2011-09-07 DIAGNOSIS — L0390 Cellulitis, unspecified: Secondary | ICD-10-CM | POA: Diagnosis not present

## 2011-09-07 DIAGNOSIS — I82409 Acute embolism and thrombosis of unspecified deep veins of unspecified lower extremity: Secondary | ICD-10-CM | POA: Diagnosis not present

## 2011-09-07 DIAGNOSIS — M7989 Other specified soft tissue disorders: Secondary | ICD-10-CM | POA: Diagnosis not present

## 2011-09-07 DIAGNOSIS — R109 Unspecified abdominal pain: Secondary | ICD-10-CM | POA: Diagnosis not present

## 2011-09-08 DIAGNOSIS — L02419 Cutaneous abscess of limb, unspecified: Secondary | ICD-10-CM | POA: Diagnosis present

## 2011-09-08 DIAGNOSIS — J9600 Acute respiratory failure, unspecified whether with hypoxia or hypercapnia: Secondary | ICD-10-CM | POA: Diagnosis not present

## 2011-09-08 DIAGNOSIS — R109 Unspecified abdominal pain: Secondary | ICD-10-CM | POA: Diagnosis not present

## 2011-09-08 DIAGNOSIS — L0291 Cutaneous abscess, unspecified: Secondary | ICD-10-CM | POA: Diagnosis not present

## 2011-09-08 DIAGNOSIS — J45901 Unspecified asthma with (acute) exacerbation: Secondary | ICD-10-CM | POA: Diagnosis present

## 2011-09-08 DIAGNOSIS — I82409 Acute embolism and thrombosis of unspecified deep veins of unspecified lower extremity: Secondary | ICD-10-CM | POA: Diagnosis not present

## 2011-09-08 DIAGNOSIS — G8929 Other chronic pain: Secondary | ICD-10-CM | POA: Diagnosis present

## 2011-09-08 DIAGNOSIS — F411 Generalized anxiety disorder: Secondary | ICD-10-CM | POA: Diagnosis present

## 2011-09-08 DIAGNOSIS — R1031 Right lower quadrant pain: Secondary | ICD-10-CM | POA: Diagnosis present

## 2011-09-08 DIAGNOSIS — L03119 Cellulitis of unspecified part of limb: Secondary | ICD-10-CM | POA: Diagnosis not present

## 2011-09-08 DIAGNOSIS — F17200 Nicotine dependence, unspecified, uncomplicated: Secondary | ICD-10-CM | POA: Diagnosis present

## 2011-09-08 DIAGNOSIS — J9620 Acute and chronic respiratory failure, unspecified whether with hypoxia or hypercapnia: Secondary | ICD-10-CM | POA: Diagnosis present

## 2011-09-08 DIAGNOSIS — M25559 Pain in unspecified hip: Secondary | ICD-10-CM | POA: Diagnosis present

## 2011-09-08 DIAGNOSIS — Z79899 Other long term (current) drug therapy: Secondary | ICD-10-CM | POA: Diagnosis not present

## 2011-09-08 DIAGNOSIS — J441 Chronic obstructive pulmonary disease with (acute) exacerbation: Secondary | ICD-10-CM | POA: Diagnosis not present

## 2011-09-08 NOTE — ED Notes (Signed)
Patient is admitted.

## 2011-10-03 ENCOUNTER — Other Ambulatory Visit: Payer: Self-pay

## 2011-10-03 ENCOUNTER — Encounter (HOSPITAL_COMMUNITY): Payer: Self-pay | Admitting: *Deleted

## 2011-10-03 ENCOUNTER — Emergency Department (HOSPITAL_COMMUNITY): Payer: Medicare Other

## 2011-10-03 ENCOUNTER — Inpatient Hospital Stay (HOSPITAL_COMMUNITY): Payer: Medicare Other

## 2011-10-03 ENCOUNTER — Inpatient Hospital Stay (HOSPITAL_COMMUNITY)
Admission: EM | Admit: 2011-10-03 | Discharge: 2011-10-08 | DRG: 189 | Disposition: A | Discharge status: Home / Self Care | Payer: Medicare Other | Attending: Internal Medicine | Admitting: Internal Medicine

## 2011-10-03 DIAGNOSIS — R062 Wheezing: Secondary | ICD-10-CM | POA: Diagnosis not present

## 2011-10-03 DIAGNOSIS — IMO0001 Reserved for inherently not codable concepts without codable children: Secondary | ICD-10-CM | POA: Diagnosis present

## 2011-10-03 DIAGNOSIS — M1990 Unspecified osteoarthritis, unspecified site: Secondary | ICD-10-CM | POA: Diagnosis present

## 2011-10-03 DIAGNOSIS — J9819 Other pulmonary collapse: Secondary | ICD-10-CM | POA: Diagnosis not present

## 2011-10-03 DIAGNOSIS — F17200 Nicotine dependence, unspecified, uncomplicated: Secondary | ICD-10-CM | POA: Diagnosis present

## 2011-10-03 DIAGNOSIS — J9600 Acute respiratory failure, unspecified whether with hypoxia or hypercapnia: Principal | ICD-10-CM | POA: Diagnosis present

## 2011-10-03 DIAGNOSIS — E872 Acidosis, unspecified: Secondary | ICD-10-CM | POA: Diagnosis present

## 2011-10-03 DIAGNOSIS — G9341 Metabolic encephalopathy: Secondary | ICD-10-CM | POA: Diagnosis present

## 2011-10-03 DIAGNOSIS — J441 Chronic obstructive pulmonary disease with (acute) exacerbation: Secondary | ICD-10-CM | POA: Diagnosis not present

## 2011-10-03 DIAGNOSIS — R0602 Shortness of breath: Secondary | ICD-10-CM | POA: Diagnosis not present

## 2011-10-03 DIAGNOSIS — R0609 Other forms of dyspnea: Secondary | ICD-10-CM | POA: Diagnosis not present

## 2011-10-03 DIAGNOSIS — J4489 Other specified chronic obstructive pulmonary disease: Secondary | ICD-10-CM | POA: Diagnosis not present

## 2011-10-03 DIAGNOSIS — J9602 Acute respiratory failure with hypercapnia: Secondary | ICD-10-CM | POA: Diagnosis present

## 2011-10-03 DIAGNOSIS — R0989 Other specified symptoms and signs involving the circulatory and respiratory systems: Secondary | ICD-10-CM | POA: Diagnosis not present

## 2011-10-03 DIAGNOSIS — J449 Chronic obstructive pulmonary disease, unspecified: Secondary | ICD-10-CM | POA: Diagnosis not present

## 2011-10-03 DIAGNOSIS — I517 Cardiomegaly: Secondary | ICD-10-CM | POA: Diagnosis not present

## 2011-10-03 DIAGNOSIS — J45901 Unspecified asthma with (acute) exacerbation: Secondary | ICD-10-CM | POA: Diagnosis present

## 2011-10-03 DIAGNOSIS — E669 Obesity, unspecified: Secondary | ICD-10-CM | POA: Diagnosis present

## 2011-10-03 DIAGNOSIS — J438 Other emphysema: Secondary | ICD-10-CM | POA: Diagnosis not present

## 2011-10-03 LAB — BLOOD GAS, ARTERIAL
Bicarbonate: 31 mEq/L — ABNORMAL HIGH (ref 20.0–24.0)
Delivery systems: POSITIVE
Expiratory PAP: 9
Expiratory PAP: 10
FIO2: 50 %
Inspiratory PAP: 24
Patient temperature: 37
pCO2 arterial: 81.2 mmHg (ref 35.0–45.0)
pCO2 arterial: 80.6 mmHg (ref 35.0–45.0)
pH, Arterial: 7.207 — ABNORMAL LOW (ref 7.350–7.400)
pH, Arterial: 7.2 — ABNORMAL LOW (ref 7.350–7.400)
pO2, Arterial: 65.8 mmHg — ABNORMAL LOW (ref 80.0–100.0)

## 2011-10-03 LAB — DIFFERENTIAL
Basophils Absolute: 0 10*3/uL (ref 0.0–0.1)
Basophils Relative: 0 % (ref 0–1)
Eosinophils Absolute: 0.1 10*3/uL (ref 0.0–0.7)
Eosinophils Relative: 1 % (ref 0–5)
Lymphs Abs: 1.4 10*3/uL (ref 0.7–4.0)

## 2011-10-03 LAB — CBC
MCH: 28.4 pg (ref 26.0–34.0)
MCHC: 31 g/dL (ref 30.0–36.0)
MCV: 91.6 fL (ref 78.0–100.0)
Platelets: 173 10*3/uL (ref 150–400)
RDW: 15.1 % (ref 11.5–15.5)

## 2011-10-03 LAB — COMPREHENSIVE METABOLIC PANEL
ALT: 12 U/L (ref 0–35)
Albumin: 3.5 g/dL (ref 3.5–5.2)
Calcium: 9.5 mg/dL (ref 8.4–10.5)
GFR calc Af Amer: 75 mL/min — ABNORMAL LOW (ref >90)
Glucose, Bld: 158 mg/dL — ABNORMAL HIGH (ref 70–99)
Sodium: 135 mEq/L (ref 135–145)
Total Protein: 7.6 g/dL (ref 6.0–8.3)

## 2011-10-03 LAB — PRO B NATRIURETIC PEPTIDE: Pro B Natriuretic peptide (BNP): 210.6 pg/mL — ABNORMAL HIGH (ref 0–125)

## 2011-10-03 LAB — POCT I-STAT TROPONIN I

## 2011-10-03 MED ORDER — ONDANSETRON HCL 4 MG/2ML IJ SOLN
4.0000 mg | Freq: Four times a day (QID) | INTRAMUSCULAR | Status: DC | PRN
  Administered 2011-10-05 – 2011-10-06 (×2): 4 mg via INTRAVENOUS
  Filled 2011-10-03 (×2): qty 2

## 2011-10-03 MED ORDER — ONDANSETRON HCL 4 MG PO TABS
4.0000 mg | ORAL_TABLET | Freq: Four times a day (QID) | ORAL | Status: DC | PRN
  Administered 2011-10-05: 4 mg via ORAL
  Filled 2011-10-03: qty 1

## 2011-10-03 MED ORDER — METHYLPREDNISOLONE SODIUM SUCC 125 MG IJ SOLR
INTRAMUSCULAR | Status: AC
  Filled 2011-10-03: qty 2

## 2011-10-03 MED ORDER — ALBUTEROL SULFATE (5 MG/ML) 0.5% IN NEBU
INHALATION_SOLUTION | RESPIRATORY_TRACT | Status: AC
  Administered 2011-10-03: 2.5 mg via RESPIRATORY_TRACT
  Filled 2011-10-03: qty 1

## 2011-10-03 MED ORDER — IPRATROPIUM BROMIDE 0.02 % IN SOLN
0.5000 mg | Freq: Once | RESPIRATORY_TRACT | Status: AC
  Administered 2011-10-03 (×2): 0.5 mg via RESPIRATORY_TRACT
  Filled 2011-10-03: qty 2.5

## 2011-10-03 MED ORDER — BUDESONIDE-FORMOTEROL FUMARATE 160-4.5 MCG/ACT IN AERO
2.0000 | INHALATION_SPRAY | Freq: Two times a day (BID) | RESPIRATORY_TRACT | Status: DC
  Filled 2011-10-03: qty 6

## 2011-10-03 MED ORDER — IPRATROPIUM BROMIDE 0.02 % IN SOLN
0.5000 mg | Freq: Once | RESPIRATORY_TRACT | Status: AC
  Administered 2011-10-03: 0.5 mg via RESPIRATORY_TRACT

## 2011-10-03 MED ORDER — SODIUM CHLORIDE 0.9 % IV SOLN
INTRAVENOUS | Status: AC
  Administered 2011-10-03: 16:00:00 via INTRAVENOUS

## 2011-10-03 MED ORDER — LEVALBUTEROL HCL 0.63 MG/3ML IN NEBU
0.6300 mg | INHALATION_SOLUTION | RESPIRATORY_TRACT | Status: DC
  Administered 2011-10-03 – 2011-10-08 (×28): 0.63 mg via RESPIRATORY_TRACT
  Filled 2011-10-03 (×28): qty 3

## 2011-10-03 MED ORDER — MOXIFLOXACIN HCL IN NACL 400 MG/250ML IV SOLN
INTRAVENOUS | Status: AC
  Filled 2011-10-03: qty 250

## 2011-10-03 MED ORDER — LEVALBUTEROL HCL 1.25 MG/0.5ML IN NEBU
1.2500 mg | INHALATION_SOLUTION | Freq: Once | RESPIRATORY_TRACT | Status: AC
  Administered 2011-10-03: 1.25 mg via RESPIRATORY_TRACT
  Filled 2011-10-03: qty 0.5

## 2011-10-03 MED ORDER — BUDESONIDE-FORMOTEROL FUMARATE 160-4.5 MCG/ACT IN AERO
INHALATION_SPRAY | RESPIRATORY_TRACT | Status: AC
  Filled 2011-10-03: qty 6

## 2011-10-03 MED ORDER — LEVALBUTEROL HCL 0.63 MG/3ML IN NEBU: 0.6300 mg | INHALATION_SOLUTION | RESPIRATORY_TRACT | Status: DC | PRN

## 2011-10-03 MED ORDER — ALBUTEROL SULFATE (5 MG/ML) 0.5% IN NEBU
2.5000 mg | INHALATION_SOLUTION | Freq: Once | RESPIRATORY_TRACT | Status: AC
  Administered 2011-10-03 (×2): 2.5 mg via RESPIRATORY_TRACT
  Filled 2011-10-03: qty 0.5

## 2011-10-03 MED ORDER — ACETAMINOPHEN 325 MG PO TABS
650.0000 mg | ORAL_TABLET | Freq: Four times a day (QID) | ORAL | Status: DC | PRN
  Administered 2011-10-04 – 2011-10-06 (×3): 650 mg via ORAL
  Filled 2011-10-03 (×3): qty 2

## 2011-10-03 MED ORDER — ADENOSINE 6 MG/2ML IV SOLN
INTRAVENOUS | Status: AC
  Filled 2011-10-03: qty 2

## 2011-10-03 MED ORDER — IPRATROPIUM BROMIDE 0.02 % IN SOLN
0.5000 mg | RESPIRATORY_TRACT | Status: DC
  Administered 2011-10-03 – 2011-10-08 (×28): 0.5 mg via RESPIRATORY_TRACT
  Filled 2011-10-03 (×28): qty 2.5

## 2011-10-03 MED ORDER — MOXIFLOXACIN HCL IN NACL 400 MG/250ML IV SOLN
400.0000 mg | INTRAVENOUS | Status: DC
  Administered 2011-10-03 – 2011-10-06 (×4): 400 mg via INTRAVENOUS
  Filled 2011-10-03 (×4): qty 250

## 2011-10-03 MED ORDER — MUPIROCIN 2 % EX OINT
1.0000 "application " | TOPICAL_OINTMENT | Freq: Two times a day (BID) | CUTANEOUS | Status: AC
  Administered 2011-10-03 – 2011-10-08 (×10): 1 via NASAL
  Filled 2011-10-03 (×4): qty 22

## 2011-10-03 MED ORDER — ADENOSINE 6 MG/2ML IV SOLN
6.0000 mg | Freq: Once | INTRAVENOUS | Status: AC
  Administered 2011-10-03: 6 mg via INTRAVENOUS

## 2011-10-03 MED ORDER — ALBUTEROL SULFATE (5 MG/ML) 0.5% IN NEBU
INHALATION_SOLUTION | RESPIRATORY_TRACT | Status: AC
  Administered 2011-10-03: 5 mg via RESPIRATORY_TRACT
  Filled 2011-10-03: qty 1

## 2011-10-03 MED ORDER — SODIUM CHLORIDE 0.9 % IJ SOLN
INTRAMUSCULAR | Status: AC
  Filled 2011-10-03: qty 6

## 2011-10-03 MED ORDER — IPRATROPIUM BROMIDE 0.02 % IN SOLN
RESPIRATORY_TRACT | Status: AC
  Administered 2011-10-03: 0.5 mg via RESPIRATORY_TRACT
  Filled 2011-10-03: qty 5

## 2011-10-03 MED ORDER — ADENOSINE 6 MG/2ML IV SOLN
INTRAVENOUS | Status: AC
  Filled 2011-10-03: qty 4

## 2011-10-03 MED ORDER — IPRATROPIUM BROMIDE 0.02 % IN SOLN
RESPIRATORY_TRACT | Status: AC
  Administered 2011-10-03: 0.5 mg via RESPIRATORY_TRACT
  Filled 2011-10-03: qty 2.5

## 2011-10-03 MED ORDER — METHYLPREDNISOLONE SODIUM SUCC 125 MG IJ SOLR
60.0000 mg | Freq: Four times a day (QID) | INTRAMUSCULAR | Status: DC
  Administered 2011-10-03 (×2): 60 mg via INTRAVENOUS
  Administered 2011-10-03: 14:00:00 via INTRAVENOUS
  Administered 2011-10-04 – 2011-10-07 (×12): 60 mg via INTRAVENOUS
  Filled 2011-10-03 (×15): qty 2

## 2011-10-03 MED ORDER — ENOXAPARIN SODIUM 30 MG/0.3ML ~~LOC~~ SOLN
30.0000 mg | SUBCUTANEOUS | Status: DC
  Administered 2011-10-03 – 2011-10-04 (×2): 30 mg via SUBCUTANEOUS
  Filled 2011-10-03 (×2): qty 0.3

## 2011-10-03 MED ORDER — ADENOSINE 6 MG/2ML IV SOLN
12.0000 mg | Freq: Once | INTRAVENOUS | Status: AC
  Administered 2011-10-03: 12 mg via INTRAVENOUS

## 2011-10-03 MED ORDER — ACETAMINOPHEN 650 MG RE SUPP: 650.0000 mg | Freq: Four times a day (QID) | RECTAL | Status: DC | PRN

## 2011-10-03 MED ORDER — CHLORHEXIDINE GLUCONATE CLOTH 2 % EX PADS
6.0000 | MEDICATED_PAD | Freq: Every day | CUTANEOUS | Status: DC
  Administered 2011-10-04 – 2011-10-07 (×4): 6 via TOPICAL

## 2011-10-03 MED ORDER — ALBUTEROL SULFATE (5 MG/ML) 0.5% IN NEBU
5.0000 mg | INHALATION_SOLUTION | Freq: Once | RESPIRATORY_TRACT | Status: AC
  Administered 2011-10-03: 5 mg via RESPIRATORY_TRACT

## 2011-10-03 NOTE — H&P (Signed)
PCP:   Rudi Heap, MD, MD   Chief Complaint:  Shortness of breath  HPI: This is a 56 year old Caucasian female with past medical history of COPD, obese, recurrent admissions for COPD exacerbation. Patient was brought to the emergency room for shortness of breath. She is unable to participate in any history is significant metabolic encephalopathy. Unfortunately there is no family at bedside. I Attempted to call the patient's sister-in-law who is her listed contact, but she was unaware the patient was brought to the emergency room. Therefore history is obtained from the medical record, ER notes. Apparently patient had sudden onset of shortness of breath occurring yesterday. This was associated with wheezing. She was given nebulizer treatments and steroids by EMS. He was brought to the emergency room where she was started on BiPAP. Will her mental status has slightly improved but she still very lethargic.  Allergies:   Allergies  Allergen Reactions  . Penicillins Shortness Of Breath and Swelling  . Sulfa Antibiotics Shortness Of Breath and Swelling      Past Medical History  Diagnosis Date  . Fibromyalgia   . Asthma   . DJD (degenerative joint disease)   . Chronic pain   . Depression   . Anxiety   . Panic attack   . Sleep disorder   . DJD (degenerative joint disease)     Past Surgical History  Procedure Date  . Abdominal surgery   . Back surgery   . Carpal tunnel release   . Ankle surgery   . Knee surgery   . Cesarean section   . Tonsillectomy   . Tubal ligation     Prior to Admission medications   Medication Sig Start Date End Date Taking? Authorizing Provider  albuterol (PROVENTIL) (2.5 MG/3ML) 0.083% nebulizer solution Take 2.5 mg by nebulization every 4 (four) hours as needed. For shortness of breath    Historical Provider, MD  budesonide-formoterol (SYMBICORT) 160-4.5 MCG/ACT inhaler Take 2 puffs first thing in am and then another 2 puffs about 12 hours later.    06/01/11 05/31/12  Sandrea Hughs, MD  cyclobenzaprine (FLEXERIL) 10 MG tablet Take 10 mg by mouth at bedtime. Up to twice daily for sleep and muscle relaxation    Historical Provider, MD  diazepam (VALIUM) 5 MG tablet Take 5 mg by mouth 2 (two) times daily as needed. For nerves    Historical Provider, MD  diclofenac sodium (VOLTAREN) 1 % GEL Apply 1 application topically 4 (four) times daily as needed. To knee for pain    Historical Provider, MD  DULoxetine (CYMBALTA) 60 MG capsule Take 60 mg by mouth daily.      Historical Provider, MD  famotidine (PEPCID) 20 MG tablet Take 20 mg by mouth at bedtime. One at bedtime  06/01/11 05/31/12  Sandrea Hughs, MD  HYDROcodone-acetaminophen (LORTAB) 10-500 MG per tablet Take 1 tablet by mouth every 6 (six) hours as needed. For pain 05/23/11   Wilson Singer, MD  levalbuterol [facility] HFA) 45 MCG/ACT inhaler Inhale 1-2 puffs into the lungs every 4 (four) hours as needed. For shortness of breath    Historical Provider, MD  lidocaine (LIDODERM) 5 % Place 1 patch onto the skin daily. As needed for pain: Remove & Discard patch within 12 hours or as directed by MD    Historical Provider, MD  montelukast (SINGULAIR) 10 MG tablet Take 10 mg by mouth every morning.     Historical Provider, MD  nystatin (MYCOSTATIN) 100000 UNIT/ML suspension Take 5 mLs by  mouth Once daily as needed. For thrush 05/28/11   Historical Provider, MD  olopatadine (PATANOL) 0.1 % ophthalmic solution Place 1 drop into both eyes 2 (two) times daily.      Historical Provider, MD  omeprazole (PRILOSEC) 40 MG capsule Take 1 capsule (40 mg total) by mouth daily. 06/01/11 05/31/12  Sandrea Hughs, MD  tiotropium (SPIRIVA HANDIHALER) 18 MCG inhalation capsule Place 1 capsule (18 mcg total) into inhaler and inhale daily. 06/01/11 05/31/12  Sandrea Hughs, MD  zolpidem (AMBIEN) 10 MG tablet Take 10 mg by mouth at bedtime as needed. For sleep     Historical Provider, MD    Social History:  reports that she has been  smoking Cigarettes.  She has a 8 pack-year smoking history. She has never used smokeless tobacco. She reports that she drinks alcohol. She reports that she does not use illicit drugs.  Family History  Problem Relation Age of Onset  . Cancer Mother     beast    Review of Systems: Positives in bold, limited by patient's mental status Constitutional: Denies fever, chills, diaphoresis, appetite change and fatigue.  HEENT: Denies photophobia, eye pain, redness, hearing loss, ear pain, congestion, sore throat, rhinorrhea, sneezing, mouth sores, trouble swallowing, neck pain, neck stiffness and tinnitus.   Respiratory: Denies SOB, DOE, cough, chest tightness,  and wheezing.   Cardiovascular: Denies chest pain, palpitations and leg swelling.  Gastrointestinal: Denies nausea, vomiting, abdominal pain, diarrhea, constipation, blood in stool and abdominal distention.  Genitourinary: Denies dysuria, urgency, frequency, hematuria, flank pain and difficulty urinating.  Musculoskeletal: Denies myalgias, back pain, joint swelling, arthralgias and gait problem.  Skin: Denies pallor, rash and wound.  Neurological: Denies dizziness, seizures, syncope, weakness, light-headedness, numbness and headaches.  Hematological: Denies adenopathy. Easy bruising, personal or family bleeding history  Psychiatric/Behavioral: Denies suicidal ideation, mood changes, confusion, nervousness, sleep disturbance and agitation   Physical Exam: Blood pressure 114/71, pulse 110, temperature 100.4 F (38 C), temperature source Rectal, resp. rate 18, SpO2 98.00%. General: The patient is lying in bed, she opens her eyes to tactile stimulus HEENT: Normocephalic, atraumatic, mucous membranes appear dry Chest: Were bilateral accessory wheezes with short frequent breaths Cardiac: S1, S2, tachycardic Abdomen: Soft, nontender, obese, bowel sounds active Extremities: Trace edema bilaterally Neurologic: Patient is unable to cooperate in  neurologic exam due to mental status, no facial asymmetry  Labsdmission:  Results for orders placed during the hospital encounter of 10/03/11 (from the past 48 hour(s))  CBC     Status: Abnormal   Collection Time   10/03/11  1:25 PM      Component Value Range Comment   WBC 7.7  4.0 - 10.5 (K/uL)    RBC 5.50 (*) 3.87 - 5.11 (MIL/uL)    Hemoglobin 15.6 (*) 12.0 - 15.0 (g/dL)    HCT 16.1 (*) 09.6 - 46.0 (%)    MCV 91.6  78.0 - 100.0 (fL)    MCH 28.4  26.0 - 34.0 (pg)    MCHC 31.0  30.0 - 36.0 (g/dL)    RDW 04.5  40.9 - 81.1 (%)    Platelets 173  150 - 400 (K/uL)   DIFFERENTIAL     Status: Normal   Collection Time   10/03/11  1:25 PM      Component Value Range Comment   Neutrophils Relative 72  43 - 77 (%)    Neutro Abs 5.6  1.7 - 7.7 (K/uL)    Lymphocytes Relative 18  12 - 46 (%)  Lymphs Abs 1.4  0.7 - 4.0 (K/uL)    Monocytes Relative 9  3 - 12 (%)    Monocytes Absolute 0.7  0.1 - 1.0 (K/uL)    Eosinophils Relative 1  0 - 5 (%)    Eosinophils Absolute 0.1  0.0 - 0.7 (K/uL)    Basophils Relative 0  0 - 1 (%)    Basophils Absolute 0.0  0.0 - 0.1 (K/uL)   COMPREHENSIVE METABOLIC PANEL     Status: Abnormal   Collection Time   10/03/11  1:25 PM      Component Value Range Comment   Sodium 135  135 - 145 (mEq/L)    Potassium 4.4  3.5 - 5.1 (mEq/L)    Chloride 96  96 - 112 (mEq/L)    CO2 31  19 - 32 (mEq/L)    Glucose, Bld 158 (*) 70 - 99 (mg/dL)    BUN 11  6 - 23 (mg/dL)    Creatinine, Ser 4.09  0.50 - 1.10 (mg/dL)    Calcium 9.5  8.4 - 10.5 (mg/dL)    Total Protein 7.6  6.0 - 8.3 (g/dL)    Albumin 3.5  3.5 - 5.2 (g/dL)    AST 22  0 - 37 (U/L)    ALT 12  0 - 35 (U/L)    Alkaline Phosphatase 97  39 - 117 (U/L)    Total Bilirubin 0.3  0.3 - 1.2 (mg/dL)    GFR calc non Af Amer 65 (*) >90 (mL/min)    GFR calc Af Amer 75 (*) >90 (mL/min)   PRO B NATRIURETIC PEPTIDE     Status: Abnormal   Collection Time   10/03/11  1:26 PM      Component Value Range Comment   Pro B Natriuretic  peptide (BNP) 210.6 (*) 0 - 125 (pg/mL)   POCT I-STAT TROPONIN I     Status: Normal   Collection Time   10/03/11  1:29 PM      Component Value Range Comment   Troponin i, poc 0.03  0.00 - 0.08 (ng/mL)    Comment 3            BLOOD GAS, ARTERIAL     Status: Abnormal   Collection Time   10/03/11  2:35 PM      Component Value Range Comment   FIO2 45.00      Delivery systems BILEVEL POSITIVE AIRWAY PRESSURE      Rate 8      Inspiratory PAP 20      Expiratory PAP 9      pH, Arterial 7.207 (*) 7.350 - 7.400     pCO2 arterial 81.2 (*) 35.0 - 45.0 (mmHg)    pO2, Arterial 65.8 (*) 80.0 - 100.0 (mmHg)    Bicarbonate 31.0 (*) 20.0 - 24.0 (mEq/L)    TCO2 28.3  0 - 100 (mmol/L)    Acid-Base Excess 3.6 (*) 0.0 - 2.0 (mmol/L)    O2 Saturation 90.2      Patient temperature 37.0      Collection site RIGHT RADIAL      Drawn by COLLECTED BY RT      Sample type ARTERIAL      Allens test (pass/fail) PASS  PASS      Radiological Exams on Admission: Dg Chest Port 1 View  10/03/2011  *RADIOLOGY REPORT*  Clinical Data: Respiratory distress  PORTABLE CHEST - 1 VIEW  Comparison: 08/09/2011  Findings: Mild cardiac enlargement and vascular congestion centrally.  No definite pneumonia, collapse, consolidation, effusion or pneumothorax.  Trachea midline.  IMPRESSION: Cardiomegaly with vascular congestion.  Stable exam  Original Report Authenticated By: Judie Petit. Ruel Favors, M.D.    Assessment/Plan Principal Problem:  *Acute respiratory failure with hypercapnia: Patient will be admitted to the step down unit. She is currently on a BiPAP machine. We will continue on steroids as well as nebulization treatments. We'll provide antibiotic coverage with Avelox and repeat an x-ray in the morning. We will also repeat an ABG right now to see if the BiPAP is improving her pH and PCO2. We'll have to watch her mental status closely as patient may end up requiring mechanical ventilation if she does not improve in the next few  hours.  Active Problems:  Smoking  Obesity  COPD (chronic obstructive pulmonary disease), please see above  Metabolic encephalopathy secondary to hypercapnia, we will do frequent neuro checks.   I have contacted Diamond Nickel who is listed as the patient's main contact. This is her sister-in-law. I have informed her of the patient's admission and current critical condition. I asked her regarding patient's wishes in terms of intubation and mechanical ventilation. She reports that patient has never really discussed this with her, but she feels that the patient would want everything done. Patient remained a full code.   Time Spent on Admission:  [name],[name] [facility] Pager: [phone] 10/03/2011, 4:22 PM

## 2011-10-03 NOTE — ED Notes (Signed)
HR increased to 170. EKG obtained. EMD notified and to bedside. Adenosine administered as ordered with no change. Pt converted after 2nd dose of Adenosine. EKG repeated. EMD at bedside during med administration.

## 2011-10-03 NOTE — ED Provider Notes (Signed)
History  Scribed for Benny Lennert, MD, the patient was seen in room APA14/APA14. This chart was scribed by Candelaria Stagers. The patient's care started at 1:11 PM    CSN: [id number]  Arrival date & time      None     Chief Complaint  Patient presents with  . Respiratory Distress     Patient is a 56 y.o. female presenting with shortness of breath. The history is provided by the EMS personnel. The history is limited by the condition of the patient.  Shortness of Breath  The current episode started yesterday. The onset was sudden. The problem occurs frequently. The problem is severe. The symptoms are relieved by nothing. Associated symptoms include shortness of breath and wheezing. Recently, medical care has been given by EMS.   Bethany Villa is a 56 y.o. female was BIBA experiencing respiratory distress.  EMS states she has been experiencing respiratory distress since last night and has gotten worse today.  Upon arrival of EMS she was experiencing audible wheezing.  She has h/o COPD and emphysema.  Pt is a smoker.  She is currently unresponsive.  She was given nebulizer and solumedrol by EMS with some improvement of wheezing.    Past Medical History  Diagnosis Date  . Fibromyalgia   . Asthma   . DJD (degenerative joint disease)   . Chronic pain   . Depression   . Anxiety   . Panic attack   . Sleep disorder   . DJD (degenerative joint disease)     Past Surgical History  Procedure Date  . Abdominal surgery   . Back surgery   . Carpal tunnel release   . Ankle surgery   . Knee surgery   . Cesarean section   . Tonsillectomy   . Tubal ligation     Family History  Problem Relation Age of Onset  . Cancer Mother     beast    History  Substance Use Topics  . Smoking status: Current Everyday Smoker -- 1.0 packs/day for 8 years    Types: Cigarettes  . Smokeless tobacco: Never Used  . Alcohol Use: Yes     occ    OB History    Grav Para Term Preterm Abortions TAB  SAB Ect Mult Living   3 2 2  1  1   2       Review of Systems  Unable to perform ROS: Other  Respiratory: Positive for shortness of breath and wheezing.     Allergies  Penicillins and Sulfa antibiotics  Home Medications   Current Outpatient Rx  Name Route Sig Dispense Refill  . ALBUTEROL SULFATE (2.5 MG/3ML) 0.083% IN NEBU Nebulization Take 2.5 mg by nebulization every 4 (four) hours as needed. For shortness of breath    . BUDESONIDE-FORMOTEROL FUMARATE 160-4.5 MCG/ACT IN AERO  Take 2 puffs first thing in am and then another 2 puffs about 12 hours later.    1 Inhaler 12  . CYCLOBENZAPRINE HCL 10 MG PO TABS Oral Take 10 mg by mouth at bedtime. Up to twice daily for sleep and muscle relaxation    . DIAZEPAM 5 MG PO TABS Oral Take 5 mg by mouth 2 (two) times daily as needed. For nerves    . DICLOFENAC SODIUM 1 % TD GEL Topical Apply 1 application topically 4 (four) times daily as needed. To knee for pain    . DULOXETINE HCL 60 MG PO CPEP Oral Take 60 mg by mouth daily.      Marland Kitchen  FAMOTIDINE 20 MG PO TABS Oral Take 20 mg by mouth at bedtime. One at bedtime     . HYDROCODONE-ACETAMINOPHEN 10-500 MG PO TABS Oral Take 1 tablet by mouth every 6 (six) hours as needed. For pain 30 tablet 0  . LEVALBUTEROL TARTRATE 45 MCG/ACT IN AERO Inhalation Inhale 1-2 puffs into the lungs every 4 (four) hours as needed. For shortness of breath    . LIDOCAINE 5 % EX PTCH Transdermal Place 1 patch onto the skin daily. As needed for pain: Remove & Discard patch within 12 hours or as directed by MD    . MONTELUKAST SODIUM 10 MG PO TABS Oral Take 10 mg by mouth every morning.     Marland Kitchen NYSTATIN 100000 UNIT/ML MT SUSP Oral Take 5 mLs by mouth Once daily as needed. For thrush    . OLOPATADINE HCL 0.1 % OP SOLN Both Eyes Place 1 drop into both eyes 2 (two) times daily.      Marland Kitchen OMEPRAZOLE 40 MG PO CPDR Oral Take 1 capsule (40 mg total) by mouth daily. 30 capsule 11  . TIOTROPIUM BROMIDE MONOHYDRATE 18 MCG IN CAPS Inhalation  Place 1 capsule (18 mcg total) into inhaler and inhale daily. 30 capsule 2  . ZOLPIDEM TARTRATE 10 MG PO TABS Oral Take 10 mg by mouth at bedtime as needed. For sleep       BP 161/92  Pulse 126  Resp 28  SpO2 96%  Physical Exam  Nursing note and vitals reviewed. Constitutional:       Unresponsive, not awake.  Obese   HENT:  Head: Normocephalic and atraumatic.  Eyes: Right eye exhibits no discharge. Left eye exhibits no discharge.  Neck: Neck supple.  Cardiovascular:       Tachycardic   Pulmonary/Chest: Effort normal. She has wheezes (signicant throughout). She exhibits no tenderness.  Abdominal: Soft. There is no tenderness. There is no rebound.  Musculoskeletal: She exhibits edema (1+ edema in ankles). She exhibits no tenderness.  Neurological:       Not awake or alert.   Skin: No rash noted.    ED Course  Procedures   DIAGNOSTIC STUDIES: Oxygen Saturation is 96% on room air, normal by my interpretation.    COORDINATION OF CARE:  1:15PM Ordered: CBC ; Differential ; Comprehensive metabolic panel ; I-Stat tropoinin I cardiac marker ; ED EKG ; Pro b natriuretic peptide (BNP) ; Saline lock IV ; DG Chest Port 1 View  1:24PM Ordered: Bipap ; DG Chest 1 View, ipratropium (ATROVENT) nebulizer solution 0.5 mg ; albuterol (PROVENTIL) (5 MG/ML) 0.5% nebulizer solution 2.5 mg  2:10 PM Recheck: Pt is more alert.   2:57 PM Recheck: Pt is somewhat responsive. Pt was SVT with pulse 171, was given 6mg  adenosine with no relief and then 12mg  adenosine which lowered heart rate to 80.   345.  Pt to be admitted to medicine.  Pt awake but still some lethargy  Labs Reviewed  CBC  DIFFERENTIAL  COMPREHENSIVE METABOLIC PANEL  PRO B NATRIURETIC PEPTIDE   No results found.  Date: 10/03/2011  Rate: 126  Rhythm: sinus tachycardia  QRS Axis: left  Intervals: normal  ST/T Wave abnormalities: normal and nonspecific ST changes  Conduction Disutrbances:none  Narrative Interpretation:    Old EKG Reviewed: none available    No diagnosis found.  CRITICAL CARE Performed by: Kaiyana Bedore L   Total critical care time: 65  Critical care time was exclusive of separately billable procedures and treating other patients.  Critical care was necessary to treat or prevent imminent or life-threatening deterioration.  Critical care was time spent personally by me on the following activities: development of treatment plan with patient and/or surrogate as well as nursing, discussions with consultants, evaluation of patient's response to treatment, examination of patient, obtaining history from patient or surrogate, ordering and performing treatments and interventions, ordering and review of laboratory studies, ordering and review of radiographic studies, pulse oximetry and re-evaluation of patient's condition.   MDM  Copd exacerbation,        Benny Lennert, MD 10/03/11 [phone]

## 2011-10-03 NOTE — ED Notes (Signed)
CRITICAL VALUE ALERT  Critical value received: ph 7.20    pco2  81.2   po2 65.8  Bicarb 31.0  Sat 90%  bipap 20/9 with 45% 02  Date of notification: 10/03/2011  Time of notification:  1443  Critical value read back:yes  Nurse who received alert:  c edward rn  MD notified (1st page):  Dr zammit  Time of first page:  1443  MD notified (2nd page):  Time of second page:  Responding MD:  Dr zammit  Time MD responded:  [phone]

## 2011-10-03 NOTE — ED Notes (Signed)
Sick since last night. Decreased LOC. Audible wheezing on arrival. Duoneb and solumedrol given enoute.

## 2011-10-03 NOTE — ED Notes (Signed)
Report called to floor. Waiting for pt to have xopenex treatment prior to transporting. RT aware of treatment.

## 2011-10-04 ENCOUNTER — Inpatient Hospital Stay (HOSPITAL_COMMUNITY): Payer: Medicare Other

## 2011-10-04 ENCOUNTER — Other Ambulatory Visit (HOSPITAL_COMMUNITY): Payer: Medicare Other

## 2011-10-04 LAB — CBC
MCV: 92 fL (ref 78.0–100.0)
Platelets: 157 10*3/uL (ref 150–400)
RBC: 5.14 MIL/uL — ABNORMAL HIGH (ref 3.87–5.11)
RDW: 15.1 % (ref 11.5–15.5)
WBC: 3 10*3/uL — ABNORMAL LOW (ref 4.0–10.5)

## 2011-10-04 LAB — BLOOD GAS, ARTERIAL
Acid-Base Excess: 6.1 mmol/L — ABNORMAL HIGH (ref 0.0–2.0)
Bicarbonate: 34.4 mEq/L — ABNORMAL HIGH (ref 20.0–24.0)
Delivery systems: POSITIVE
Drawn by: 23588
Expiratory PAP: 10
FIO2: 0.5 %
Inspiratory PAP: 24
O2 Content: 50 L/min
O2 Saturation: 93.4 %
Patient temperature: 37
Patient temperature: 37
pCO2 arterial: 58.2 mmHg (ref 35.0–45.0)
pH, Arterial: 7.389 (ref 7.350–7.400)
pO2, Arterial: 70.6 mmHg — ABNORMAL LOW (ref 80.0–100.0)

## 2011-10-04 LAB — BASIC METABOLIC PANEL
CO2: 32 mEq/L (ref 19–32)
Calcium: 9.4 mg/dL (ref 8.4–10.5)
Creatinine, Ser: 0.56 mg/dL (ref 0.50–1.10)
GFR calc Af Amer: >90 mL/min (ref >90)
GFR calc non Af Amer: >90 mL/min (ref >90)
Sodium: 138 mEq/L (ref 135–145)

## 2011-10-04 MED ORDER — GUAIFENESIN ER 600 MG PO TB12
1200.0000 mg | ORAL_TABLET | Freq: Two times a day (BID) | ORAL | Status: DC
  Administered 2011-10-04 – 2011-10-08 (×9): 1200 mg via ORAL
  Filled 2011-10-04 (×9): qty 2

## 2011-10-04 MED ORDER — SODIUM CHLORIDE 0.9 % IJ SOLN
INTRAMUSCULAR | Status: AC
  Filled 2011-10-04: qty 3

## 2011-10-04 MED ORDER — PANTOPRAZOLE SODIUM 40 MG PO TBEC
40.0000 mg | DELAYED_RELEASE_TABLET | Freq: Two times a day (BID) | ORAL | Status: DC
  Administered 2011-10-04 – 2011-10-08 (×9): 40 mg via ORAL
  Filled 2011-10-04 (×9): qty 1

## 2011-10-04 MED ORDER — GUAIFENESIN-DM 100-10 MG/5ML PO SYRP
5.0000 mL | ORAL_SOLUTION | ORAL | Status: DC | PRN
  Administered 2011-10-04 – 2011-10-07 (×13): 5 mL via ORAL
  Filled 2011-10-04 (×13): qty 5

## 2011-10-04 NOTE — Progress Notes (Signed)
Utilization review completed.  

## 2011-10-04 NOTE — Progress Notes (Signed)
. Subjective: Patient is more alert today, able to converse.  Reports that breathing has improved from yesterday, but she still feels very short of breath.  She is coughing. She remains on bipap.  Objective: Vital signs in last 24 hours: Temp:  [97.7 F (36.5 C)-100.5 F (38.1 C)] 97.7 F (36.5 C) (02/04 0416) Pulse Rate:  [91-126] 94  (02/04 0700) Resp:  [17-33] 21  (02/04 0700) BP: (106-161)/(54-119) 134/85 mmHg (02/04 0700) SpO2:  [91 %-100 %] 97 % (02/04 0700) FiO2 (%):  [45 %-50 %] 50 % (02/04 0028) Weight:  [98.8 kg (217 lb 13 oz)-101.5 kg (223 lb 12.3 oz)] 101.5 kg (223 lb 12.3 oz) (02/04 0500) Weight change:     Intake/Output from previous day: 02/03 0701 - 02/04 0700 In: 96.7 [I.V.:96.7] Out: 700 [Urine:700]     Physical Exam: General: Alert, awake, oriented x3, in no acute distress. HEENT: No bruits, no goiter. Heart: Regular rate and rhythm, without murmurs, rubs, gallops. Lungs: rhonchi on right side, air movement improved from yesterday, no wheezing. Abdomen: Soft, nontender, nondistended, positive bowel sounds. Extremities: No clubbing cyanosis or edema with positive pedal pulses. Neuro: Grossly intact, nonfocal.    Lab Results: Basic Metabolic Panel:  Basename 10/04/11 0510 10/03/11 1325  NA 138 135  K 4.5 4.4  CL 98 96  CO2 32 31  GLUCOSE 141* 158*  BUN 12 11  CREATININE 0.56 0.97  CALCIUM 9.4 9.5  MG -- --  PHOS -- --   Liver Function Tests:  Basename 10/03/11 1325  AST 22  ALT 12  ALKPHOS 97  BILITOT 0.3  PROT 7.6  ALBUMIN 3.5   No results found for this basename: LIPASE:2,AMYLASE:2 in the last 72 hours No results found for this basename: AMMONIA:2 in the last 72 hours CBC:  Basename 10/04/11 0510 10/03/11 1325  WBC 3.0* 7.7  NEUTROABS -- 5.6  HGB 14.1 15.6*  HCT 47.3* 50.4*  MCV 92.0 91.6  PLT 157 173   Cardiac Enzymes: No results found for this basename: CKTOTAL:3,CKMB:3,CKMBINDEX:3,TROPONINI:3 in the last 72  hours BNP:  Basename 10/03/11 1326  PROBNP 210.6*   D-Dimer: No results found for this basename: DDIMER:2 in the last 72 hours CBG: No results found for this basename: GLUCAP:6 in the last 72 hours Hemoglobin A1C: No results found for this basename: HGBA1C in the last 72 hours Fasting Lipid Panel: No results found for this basename: CHOL,HDL,LDLCALC,TRIG,CHOLHDL,LDLDIRECT in the last 72 hours Thyroid Function Tests: No results found for this basename: TSH,T4TOTAL,FREET4,T3FREE,THYROIDAB in the last 72 hours Anemia Panel: No results found for this basename: VITAMINB12,FOLATE,FERRITIN,TIBC,IRON,RETICCTPCT in the last 72 hours Coagulation: No results found for this basename: LABPROT:2,INR:2 in the last 72 hours Urine Drug Screen: Drugs of Abuse  No results found for this basename: labopia, cocainscrnur, labbenz, amphetmu, thcu, labbarb    Alcohol Level: No results found for this basename: ETH:2 in the last 72 hours Urinalysis: No results found for this basename: COLORURINE:2,APPERANCEUR:2,LABSPEC:2,PHURINE:2,GLUCOSEU:2,HGBUR:2,BILIRUBINUR:2,KETONESUR:2,PROTEINUR:2,UROBILINOGEN:2,NITRITE:2,LEUKOCYTESUR:2 in the last 72 hours Misc. Labs:  Recent Results (from the past 240 hour(s))  MRSA PCR SCREENING     Status: Abnormal   Collection Time   10/03/11  4:40 PM      Component Value Range Status Comment   MRSA by PCR POSITIVE (*) NEGATIVE  Final     Studies/Results: Portable Chest 1 View  10/04/2011  *RADIOLOGY REPORT*  Clinical Data: Respiratory distress.  PORTABLE CHEST - 1 VIEW  Comparison: Chest x-ray to 12/01/2011.  Findings: The cardiac silhouette, mediastinal and  hilar contours are within normal limits and stable.  The lung shows slight improved lung aeration with decreased in vascular congestion and areas of atelectasis.  No pleural effusions.  IMPRESSION: Improving lung aeration.  Original Report Authenticated By: P. Loralie Champagne, M.D.   Dg Chest Port 1 View  10/03/2011   *RADIOLOGY REPORT*  Clinical Data: Respiratory distress  PORTABLE CHEST - 1 VIEW  Comparison: 08/09/2011  Findings: Mild cardiac enlargement and vascular congestion centrally.  No definite pneumonia, collapse, consolidation, effusion or pneumothorax.  Trachea midline.  IMPRESSION: Cardiomegaly with vascular congestion.  Stable exam  Original Report Authenticated By: Judie Petit. Ruel Favors, M.D.    Medications: Scheduled Meds:   . sodium chloride   Intravenous STAT  . adenosine  12 mg Intravenous Once  . adenosine  6 mg Intravenous Once  . ipratropium  0.5 mg Nebulization Once   And  . albuterol  2.5 mg Nebulization Once  . ipratropium  0.5 mg Nebulization Once   And  . albuterol  5 mg Nebulization Once  . budesonide-formoterol  2 puff Inhalation BID  . Chlorhexidine Gluconate Cloth  6 each Topical Q0600  . enoxaparin  30 mg Subcutaneous Q24H  . ipratropium  0.5 mg Nebulization Q4H  . levalbuterol  0.63 mg Nebulization Q4H  . levalbuterol  1.25 mg Nebulization Once  . methylPREDNISolone (SOLU-MEDROL) injection  60 mg Intravenous Q6H  . methylPREDNISolone sodium succinate      . moxifloxacin  400 mg Intravenous Q24H  . mupirocin ointment  1 application Nasal BID  . sodium chloride       Continuous Infusions:  PRN Meds:.acetaminophen, acetaminophen, levalbuterol, ondansetron (ZOFRAN) IV, ondansetron  Assessment/Plan:  Principal Problem:  *Acute respiratory failure with hypercapnia:  Patient has improved clinically with bipap.  Her blood gas also looks improved this morning.  She still has a significant respiratory acidosis, so I do not feel that she is ready to come off the bipap yet.  We will repeat an abg in the evening to re assess.  Continue steroids, nebs, abx, and pulmonary hygiene.  Start clear liquids Active Problems:  Smoking, counseled.  Obesity  COPD (chronic obstructive pulmonary disease)  Metabolic encephalopathy, improved with bipap therapy  Remain in SDU today.   LOS: 1  day   Jozlynn Plaia [facility] Pager: [phone] 10/04/2011, 8:10 AM

## 2011-10-04 NOTE — Therapy (Signed)
Patient has worn bipap all day and till around 2100 tonight pt wanted machine off  sats are 95% on 3lpm will continue to moniter patient

## 2011-10-05 LAB — CBC
HCT: 47.7 % — ABNORMAL HIGH (ref 36.0–46.0)
Hemoglobin: 14.7 g/dL (ref 12.0–15.0)
MCV: 91.4 fL (ref 78.0–100.0)
Platelets: 158 10*3/uL (ref 150–400)
RBC: 5.22 MIL/uL — ABNORMAL HIGH (ref 3.87–5.11)
WBC: 6.4 10*3/uL (ref 4.0–10.5)

## 2011-10-05 LAB — BASIC METABOLIC PANEL
BUN: 18 mg/dL (ref 6–23)
CO2: 35 mEq/L — ABNORMAL HIGH (ref 19–32)
Chloride: 98 mEq/L (ref 96–112)
Creatinine, Ser: 0.53 mg/dL (ref 0.50–1.10)
Glucose, Bld: 143 mg/dL — ABNORMAL HIGH (ref 70–99)

## 2011-10-05 LAB — BLOOD GAS, ARTERIAL
Acid-Base Excess: 9.3 mmol/L — ABNORMAL HIGH (ref 0.0–2.0)
Bicarbonate: 34.5 mEq/L — ABNORMAL HIGH (ref 20.0–24.0)
TCO2: 30.2 mmol/L (ref 0–100)
pCO2 arterial: 58.6 mmHg (ref 35.0–45.0)
pO2, Arterial: 59 mmHg — ABNORMAL LOW (ref 80.0–100.0)

## 2011-10-05 MED ORDER — DIAZEPAM 5 MG PO TABS
5.0000 mg | ORAL_TABLET | Freq: Two times a day (BID) | ORAL | Status: DC | PRN
  Administered 2011-10-05 – 2011-10-07 (×4): 5 mg via ORAL
  Filled 2011-10-05 (×4): qty 1

## 2011-10-05 MED ORDER — SODIUM CHLORIDE 0.9 % IJ SOLN
INTRAMUSCULAR | Status: AC
  Administered 2011-10-05: 05:00:00
  Filled 2011-10-05: qty 3

## 2011-10-05 MED ORDER — BENZONATATE 100 MG PO CAPS
100.0000 mg | ORAL_CAPSULE | Freq: Three times a day (TID) | ORAL | Status: DC | PRN
  Administered 2011-10-05 – 2011-10-07 (×5): 100 mg via ORAL
  Filled 2011-10-05 (×5): qty 1

## 2011-10-05 MED ORDER — BUDESONIDE-FORMOTEROL FUMARATE 160-4.5 MCG/ACT IN AERO
2.0000 | INHALATION_SPRAY | Freq: Two times a day (BID) | RESPIRATORY_TRACT | Status: DC
  Administered 2011-10-05 – 2011-10-08 (×7): 2 via RESPIRATORY_TRACT
  Filled 2011-10-05: qty 6

## 2011-10-05 MED ORDER — SODIUM CHLORIDE 0.9 % IJ SOLN
INTRAMUSCULAR | Status: AC
  Administered 2011-10-06: 10 mL
  Filled 2011-10-05: qty 3

## 2011-10-05 MED ORDER — ENOXAPARIN SODIUM 60 MG/0.6ML ~~LOC~~ SOLN
50.0000 mg | SUBCUTANEOUS | Status: DC
  Administered 2011-10-05 – 2011-10-07 (×3): 50 mg via SUBCUTANEOUS
  Filled 2011-10-05 (×3): qty 0.6

## 2011-10-05 NOTE — Therapy (Signed)
Patient has refused bipap at this time due to nausea

## 2011-10-05 NOTE — Therapy (Signed)
Placed patient back on bipap 94% on settings

## 2011-10-05 NOTE — Progress Notes (Signed)
Pharmacy - Lovenox adjusted for increased BMI to 0.5mg /kg SQ daily. Mady Gemma, [facility] 10/05/2011 9:24 AM

## 2011-10-05 NOTE — Progress Notes (Signed)
Subjective: Patient is off the bipap this morning.  Reports that breathing is improving, she is coughing, and still wheezing.   Objective: Vital signs in last 24 hours: Temp:  [97.7 F (36.5 C)-98.3 F (36.8 C)] 97.7 F (36.5 C) (02/05 0700) Pulse Rate:  [51-102] 93  (02/05 0600) Resp:  [17-33] 25  (02/05 0600) BP: (106-148)/(71-94) 139/87 mmHg (02/05 0600) SpO2:  [89 %-100 %] 98 % (02/05 0659) FiO2 (%):  [50 %] 50 % (02/04 1910) Weight:  [102 kg (224 lb 13.9 oz)] 102 kg (224 lb 13.9 oz) (02/05 0400) Weight change: 3.2 kg (7 lb 0.9 oz)    Intake/Output from previous day: 02/04 0701 - 02/05 0700 In: 1143 [P.O.:1140; I.V.:3] Out: 950 [Urine:950]     Physical Exam: General: Alert, awake, oriented x3, still appears dyspneic HEENT: No bruits, no goiter. Heart: Regular rate and rhythm, without murmurs, rubs, gallops. Lungs: b/l exp wheezes. Abdomen: Soft, nontender, nondistended, positive bowel sounds. Extremities: No clubbing cyanosis or edema with positive pedal pulses. Neuro: Grossly intact, nonfocal.    Lab Results: Basic Metabolic Panel:  Basename 10/05/11 0455 10/04/11 0510  NA 138 138  K 4.3 4.5  CL 98 98  CO2 35* 32  GLUCOSE 143* 141*  BUN 18 12  CREATININE 0.53 0.56  CALCIUM 9.6 9.4  MG -- --  PHOS -- --   Liver Function Tests:  Basename 10/03/11 1325  AST 22  ALT 12  ALKPHOS 97  BILITOT 0.3  PROT 7.6  ALBUMIN 3.5   No results found for this basename: LIPASE:2,AMYLASE:2 in the last 72 hours No results found for this basename: AMMONIA:2 in the last 72 hours CBC:  Basename 10/05/11 0455 10/04/11 0510 10/03/11 1325  WBC 6.4 3.0* --  NEUTROABS -- -- 5.6  HGB 14.7 14.1 --  HCT 47.7* 47.3* --  MCV 91.4 92.0 --  PLT 158 157 --   Cardiac Enzymes: No results found for this basename: CKTOTAL:3,CKMB:3,CKMBINDEX:3,TROPONINI:3 in the last 72 hours BNP:  Basename 10/03/11 1326  PROBNP 210.6*   D-Dimer: No results found for this basename: DDIMER:2  in the last 72 hours CBG: No results found for this basename: GLUCAP:6 in the last 72 hours Hemoglobin A1C: No results found for this basename: HGBA1C in the last 72 hours Fasting Lipid Panel: No results found for this basename: CHOL,HDL,LDLCALC,TRIG,CHOLHDL,LDLDIRECT in the last 72 hours Thyroid Function Tests: No results found for this basename: TSH,T4TOTAL,FREET4,T3FREE,THYROIDAB in the last 72 hours Anemia Panel: No results found for this basename: VITAMINB12,FOLATE,FERRITIN,TIBC,IRON,RETICCTPCT in the last 72 hours Coagulation: No results found for this basename: LABPROT:2,INR:2 in the last 72 hours Urine Drug Screen: Drugs of Abuse  No results found for this basename: labopia, cocainscrnur, labbenz, amphetmu, thcu, labbarb    Alcohol Level: No results found for this basename: ETH:2 in the last 72 hours Urinalysis: No results found for this basename: COLORURINE:2,APPERANCEUR:2,LABSPEC:2,PHURINE:2,GLUCOSEU:2,HGBUR:2,BILIRUBINUR:2,KETONESUR:2,PROTEINUR:2,UROBILINOGEN:2,NITRITE:2,LEUKOCYTESUR:2 in the last 72 hours  Recent Results (from the past 240 hour(s))  MRSA PCR SCREENING     Status: Abnormal   Collection Time   10/03/11  4:40 PM      Component Value Range Status Comment   MRSA by PCR POSITIVE (*) NEGATIVE  Final     Studies/Results: Portable Chest 1 View  10/04/2011  *RADIOLOGY REPORT*  Clinical Data: Respiratory distress.  PORTABLE CHEST - 1 VIEW  Comparison: Chest x-ray to 12/01/2011.  Findings: The cardiac silhouette, mediastinal and hilar contours are within normal limits and stable.  The lung shows slight improved lung aeration  with decreased in vascular congestion and areas of atelectasis.  No pleural effusions.  IMPRESSION: Improving lung aeration.  Original Report Authenticated By: P. Loralie Champagne, M.D.   Dg Chest Port 1 View  10/03/2011  *RADIOLOGY REPORT*  Clinical Data: Respiratory distress  PORTABLE CHEST - 1 VIEW  Comparison: 08/09/2011  Findings: Mild cardiac  enlargement and vascular congestion centrally.  No definite pneumonia, collapse, consolidation, effusion or pneumothorax.  Trachea midline.  IMPRESSION: Cardiomegaly with vascular congestion.  Stable exam  Original Report Authenticated By: Judie Petit. Ruel Favors, M.D.    Medications: Scheduled Meds:   . Chlorhexidine Gluconate Cloth  6 each Topical Q0600  . enoxaparin  30 mg Subcutaneous Q24H  . guaiFENesin  1,200 mg Oral BID  . ipratropium  0.5 mg Nebulization Q4H  . levalbuterol  0.63 mg Nebulization Q4H  . methylPREDNISolone (SOLU-MEDROL) injection  60 mg Intravenous Q6H  . moxifloxacin  400 mg Intravenous Q24H  . mupirocin ointment  1 application Nasal BID  . pantoprazole  40 mg Oral BID AC  . sodium chloride      . sodium chloride      . DISCONTD: budesonide-formoterol  2 puff Inhalation BID   Continuous Infusions:  PRN Meds:.acetaminophen, acetaminophen, guaiFENesin-dextromethorphan, levalbuterol, ondansetron (ZOFRAN) IV, ondansetron  Assessment/Plan:  Principal Problem:  *Acute respiratory failure with hypercapnia, ABG is improved with bipap therapy. Patient is likely a CO2 retainer. She is still dyspneic with a respiratory rate in the high 20s to low 30s.  We will continue with steroids, abx, nebs and pulmonary hygiene. Titrate oxygen down as tolerated. Active Problems:  Smoking  Obesity  COPD (chronic obstructive pulmonary disease)  Metabolic encephalopathy, resolved, secondary to elevated PCO2 Anxiety. Restart some of her home medications  Continue to monitor in SDU today, as she may have to go back on the bipap   LOS: 2 days   [name],[name] [facility] Pager: [phone] 10/05/2011, 8:02 AM

## 2011-10-06 LAB — CBC
Hemoglobin: 15.2 g/dL — ABNORMAL HIGH (ref 12.0–15.0)
RBC: 5.26 MIL/uL — ABNORMAL HIGH (ref 3.87–5.11)
WBC: 7.9 10*3/uL (ref 4.0–10.5)

## 2011-10-06 LAB — BASIC METABOLIC PANEL
CO2: 30 mEq/L (ref 19–32)
Chloride: 99 mEq/L (ref 96–112)
Glucose, Bld: 130 mg/dL — ABNORMAL HIGH (ref 70–99)
Potassium: 4.2 mEq/L (ref 3.5–5.1)
Sodium: 138 mEq/L (ref 135–145)

## 2011-10-06 MED ORDER — HYDROCODONE-ACETAMINOPHEN 10-325 MG PO TABS
1.0000 | ORAL_TABLET | Freq: Four times a day (QID) | ORAL | Status: DC | PRN
  Administered 2011-10-06 – 2011-10-08 (×8): 1 via ORAL
  Filled 2011-10-06 (×9): qty 1

## 2011-10-06 NOTE — Therapy (Signed)
Patient refused bipap therapy tonight I explained the reasons of bipap and patient said if she needs it she will call. RN notifed

## 2011-10-06 NOTE — Progress Notes (Signed)
Subjective: She feels a little better today, breathing is slowly improving, still has significant cough, mental status cleared  Objective: Vital signs in last 24 hours: Temp:  [98.1 F (36.7 C)-99.5 F (37.5 C)] 99.5 F (37.5 C) (02/06 0800) Pulse Rate:  [85-114] 105  (02/06 0800) Resp:  [24-36] 25  (02/06 0800) BP: (120-178)/(65-126) 148/71 mmHg (02/06 0800) SpO2:  [86 %-99 %] 92 % (02/06 0800) Weight:  [97.5 kg (214 lb 15.2 oz)] 97.5 kg (214 lb 15.2 oz) (02/06 0400) Weight change: -4.5 kg (-9 lb 14.7 oz)    Intake/Output from previous day: 02/05 0701 - 02/06 0700 In: 252 [IV Piggyback:252] Out: 1350 [Urine:1350] Total I/O In: 240 [P.O.:240] Out: -    Physical Exam: General: Alert, awake, oriented x3, still appears to be dyspneic with short, shallow breaths. HEENT: No bruits, no goiter. Heart: Regular rate and rhythm, without murmurs, rubs, gallops. Lungs: bilateral wheezes. Abdomen: Soft, nontender, nondistended, positive bowel sounds. Extremities: No clubbing cyanosis or edema with positive pedal pulses. Neuro: Grossly intact, nonfocal.    Lab Results: Basic Metabolic Panel:  Basename 10/06/11 0451 10/05/11 0455  NA 138 138  K 4.2 4.3  CL 99 98  CO2 30 35*  GLUCOSE 130* 143*  BUN 17 18  CREATININE 0.58 0.53  CALCIUM 9.2 9.6  MG -- --  PHOS -- --   Liver Function Tests:  Basename 10/03/11 1325  AST 22  ALT 12  ALKPHOS 97  BILITOT 0.3  PROT 7.6  ALBUMIN 3.5   No results found for this basename: LIPASE:2,AMYLASE:2 in the last 72 hours No results found for this basename: AMMONIA:2 in the last 72 hours CBC:  Basename 10/06/11 0451 10/05/11 0455 10/03/11 1325  WBC 7.9 6.4 --  NEUTROABS -- -- 5.6  HGB 15.2* 14.7 --  HCT 48.0* 47.7* --  MCV 91.3 91.4 --  PLT 141* 158 --   Cardiac Enzymes: No results found for this basename: CKTOTAL:3,CKMB:3,CKMBINDEX:3,TROPONINI:3 in the last 72 hours BNP:  Basename 10/03/11 1326  PROBNP 210.6*    D-Dimer: No results found for this basename: DDIMER:2 in the last 72 hours CBG: No results found for this basename: GLUCAP:6 in the last 72 hours Hemoglobin A1C: No results found for this basename: HGBA1C in the last 72 hours Fasting Lipid Panel: No results found for this basename: CHOL,HDL,LDLCALC,TRIG,CHOLHDL,LDLDIRECT in the last 72 hours Thyroid Function Tests: No results found for this basename: TSH,T4TOTAL,FREET4,T3FREE,THYROIDAB in the last 72 hours Anemia Panel: No results found for this basename: VITAMINB12,FOLATE,FERRITIN,TIBC,IRON,RETICCTPCT in the last 72 hours Coagulation: No results found for this basename: LABPROT:2,INR:2 in the last 72 hours Urine Drug Screen: Drugs of Abuse  No results found for this basename: labopia, cocainscrnur, labbenz, amphetmu, thcu, labbarb    Alcohol Level: No results found for this basename: ETH:2 in the last 72 hours Urinalysis: No results found for this basename: COLORURINE:2,APPERANCEUR:2,LABSPEC:2,PHURINE:2,GLUCOSEU:2,HGBUR:2,BILIRUBINUR:2,KETONESUR:2,PROTEINUR:2,UROBILINOGEN:2,NITRITE:2,LEUKOCYTESUR:2 in the last 72 hours  Recent Results (from the past 240 hour(s))  MRSA PCR SCREENING     Status: Abnormal   Collection Time   10/03/11  4:40 PM      Component Value Range Status Comment   MRSA by PCR POSITIVE (*) NEGATIVE  Final     Studies/Results: No results found.  Medications: Scheduled Meds:   . budesonide-formoterol  2 puff Inhalation BID  . Chlorhexidine Gluconate Cloth  6 each Topical Q0600  . enoxaparin  50 mg Subcutaneous Q24H  . guaiFENesin  1,200 mg Oral BID  . ipratropium  0.5 mg Nebulization Q4H  .  levalbuterol  0.63 mg Nebulization Q4H  . methylPREDNISolone (SOLU-MEDROL) injection  60 mg Intravenous Q6H  . moxifloxacin  400 mg Intravenous Q24H  . mupirocin ointment  1 application Nasal BID  . pantoprazole  40 mg Oral BID AC  . sodium chloride      . DISCONTD: enoxaparin  30 mg Subcutaneous Q24H    Continuous Infusions:  PRN Meds:.acetaminophen, acetaminophen, benzonatate, diazepam, guaiFENesin-dextromethorphan, levalbuterol, ondansetron (ZOFRAN) IV, ondansetron  Assessment/Plan:  This is a 56 year old female with severe COPD who continues to smoke. She was admitted to the hospital with a COPD exacerbation. On admission she was encephalopathic/obtunded due to hypercapnia. Blood gas done in the emergency room showed a pH of 7.2 and a PCO2 of 80. She was started on BiPAP therapy and admitted to the step down unit.  Principal Problem: *Acute respiratory failure with hypercapnia, patient has made significant improvement with steroids, nebulization treatments, and BiPAP therapy. Her respiratory acidosis has significantly improved. Her last pH is 7.38. Her PCO2 is still elevated at 58, but this is likely a chronic feature. She still appears to have some mild respiratory distress, and does not appear back to her baseline. She still actively wheezing. We will continue with steroids and nebulization treatments. Patient has been slow to progress. I feel we just need to continue current therapy, and give her more time. She is currently off the bipap.  Active Problems:   Smoking, patient was repeatedly counseled on the importance of tobacco cessation   Obesity   COPD (chronic obstructive pulmonary disease), see above   Metabolic encephalopathy, due to hypercapnia, improved.  Dispo:  Anticipate that she will be ready for discharge home in the next few days.    LOS: 3 days   Villa,Bethany [facility] Pager: [phone] 10/06/2011, 8:28 AM

## 2011-10-07 LAB — CBC
HCT: 48.2 % — ABNORMAL HIGH (ref 36.0–46.0)
Hemoglobin: 14.3 g/dL (ref 12.0–15.0)
MCH: 27.2 pg (ref 26.0–34.0)
MCHC: 29.7 g/dL — ABNORMAL LOW (ref 30.0–36.0)
MCV: 91.6 fL (ref 78.0–100.0)
Platelets: 162 10*3/uL (ref 150–400)
RBC: 5.26 MIL/uL — ABNORMAL HIGH (ref 3.87–5.11)
RDW: 14.7 % (ref 11.5–15.5)
WBC: 5.9 10*3/uL (ref 4.0–10.5)

## 2011-10-07 LAB — BASIC METABOLIC PANEL
CO2: 37 mEq/L — ABNORMAL HIGH (ref 19–32)
Calcium: 9.1 mg/dL (ref 8.4–10.5)
Chloride: 96 mEq/L (ref 96–112)
Creatinine, Ser: 0.5 mg/dL (ref 0.50–1.10)
Glucose, Bld: 139 mg/dL — ABNORMAL HIGH (ref 70–99)

## 2011-10-07 MED ORDER — MOXIFLOXACIN HCL 400 MG PO TABS
400.0000 mg | ORAL_TABLET | Freq: Every day | ORAL | Status: DC
  Administered 2011-10-07: 400 mg via ORAL
  Filled 2011-10-07: qty 1

## 2011-10-07 MED ORDER — PREDNISONE 20 MG PO TABS
40.0000 mg | ORAL_TABLET | Freq: Every day | ORAL | Status: DC
  Administered 2011-10-07 – 2011-10-08 (×2): 40 mg via ORAL
  Filled 2011-10-07 (×2): qty 2

## 2011-10-07 NOTE — Progress Notes (Signed)
Subjective: This lady is almost back to her baseline but she still requiring 4 L oxygen per minute. Unfortunately, since her last admission, she still continued to smoke cigarettes. She swears this is the end of her smoking history.           Physical Exam: Blood pressure 154/96, pulse 94, temperature 97.6 F (36.4 C), temperature source Axillary, resp. rate 19, height 5\' 6"  (1.676 m), weight 103.6 kg (228 lb 6.3 oz), SpO2 95.00%. She looks systemically well and is not toxic. There is no peripheral or central cyanosis. Lung fields show bilateral wheezing which is not tight and I suspect is her baseline. She is alert and orientated. She is able to speak in sentences.   Investigations:  Recent Results (from the past 240 hour(s))  MRSA PCR SCREENING     Status: Abnormal   Collection Time   10/03/11  4:40 PM      Component Value Range Status Comment   MRSA by PCR POSITIVE (*) NEGATIVE  Final      Basic Metabolic Panel:  Basename 10/07/11 0450 10/06/11 0451  NA 139 138  K 4.2 4.2  CL 96 99  CO2 37* 30  GLUCOSE 139* 130*  BUN 20 17  CREATININE 0.50 0.58  CALCIUM 9.1 9.2  MG -- --  PHOS -- --       CBC:  Basename 10/07/11 0450 10/06/11 0451  WBC 5.9 7.9  NEUTROABS -- --  HGB 14.3 15.2*  HCT 48.2* 48.0*  MCV 91.6 91.3  PLT 162 141*        Medications: I have reviewed the patient's current medications.  Impression: 1. Exacerbation of COPD resulting in acute type II respiratory failure, improving. She required BiPAP initially and was quite sick. 2. Ongoing tobacco abuse. 3. Obesity. 4. Metabolic encephalopathy secondary to hypercapnia in the face of type II respiratory failure, resolved.     Plan: 1. Discontinue IV steroids. Start oral steroids. Reduce FiO2 if possible. 2. Discontinue IV antibiotics and converted to oral Avelox. 3. Review tomorrow, possibly discharge.     LOS: 4 days   Wilson Singer Pager [phone]  10/07/2011, 7:52 AM

## 2011-10-08 MED ORDER — BUDESONIDE-FORMOTEROL FUMARATE 160-4.5 MCG/ACT IN AERO: 2.0000 | INHALATION_SPRAY | Freq: Two times a day (BID) | RESPIRATORY_TRACT | Status: DC

## 2011-10-08 MED ORDER — PREDNISONE 20 MG PO TABS: ORAL_TABLET | ORAL | Status: DC

## 2011-10-08 MED ORDER — MOXIFLOXACIN HCL 400 MG PO TABS: 400.0000 mg | ORAL_TABLET | Freq: Every day | ORAL | Status: AC

## 2011-10-08 MED ORDER — GUAIFENESIN-DM 100-10 MG/5ML PO SYRP: 5.0000 mL | ORAL_SOLUTION | ORAL | Status: AC | PRN

## 2011-10-08 MED ORDER — OXYCODONE-ACETAMINOPHEN 10-325 MG PO TABS: 1.0000 | ORAL_TABLET | ORAL | Status: AC | PRN

## 2011-10-08 NOTE — Progress Notes (Signed)
CARE MANAGEMENT NOTE 10/08/2011  Patient:  Bethany Villa, Bethany Villa   Account Number:  [id number]  Date Initiated:  10/08/2011  Documentation initiated by:  Rosemary Holms  Subjective/Objective Assessment:   Pt admitted with SOB. PTA lived at home. Has O2 at home.     Action/Plan:   Pt to be discharged.Family member to bring portable O2 for pt transfer. No HH needs identified   Anticipated DC Date:  10/08/2011   Anticipated DC Plan:  HOME/SELF CARE      DC Planning Services  CM consult      Choice offered to / List presented to:             Status of service:  Completed, signed off Medicare Important Message given?   (If response is "NO", the following Medicare IM given date fields will be blank) Date Medicare IM given:   Date Additional Medicare IM given:    Discharge Disposition:  HOME/SELF CARE  Per UR Regulation:    Comments:  10/08/11 1000 Devra Stare Leanord Hawking RN BSN CM

## 2011-10-08 NOTE — Discharge Summary (Addendum)
Physician Discharge Summary  Patient ID: Bethany Villa MRN: [id number] DOB/AGE: Jan 25, 1956 56 y.o. Primary Care Physician:MOORE, Dorinda Hill, MD, MD Admit date: 10/03/2011 Discharge date: 10/08/2011    Discharge Diagnoses:  1. Acute respiratory failure type II secondary to exacerbation of COPD. 2. Ongoing tobacco abuse. 3. Obesity. 4. Metabolic encephalopathy secondary to type II respiratory failure with hypercapnia, resolved.   Medication List  As of 10/08/2011  7:43 AM   STOP taking these medications         albuterol (2.5 MG/3ML) 0.083% nebulizer solution      famotidine 20 MG tablet      HYDROcodone-acetaminophen 10-500 MG per tablet      lidocaine 5 %      nystatin 100000 UNIT/ML suspension         TAKE these medications         albuterol 108 (90 BASE) MCG/ACT inhaler   Commonly known as: PROVENTIL HFA;VENTOLIN HFA   Inhale 2 puffs into the lungs every 6 (six) hours as needed. For shortness of breath      budesonide-formoterol 160-4.5 MCG/ACT inhaler   Commonly known as: SYMBICORT   Inhale 2 puffs into the lungs 2 (two) times daily.      buPROPion 150 MG 24 hr tablet   Commonly known as: WELLBUTRIN XL   Take 150 mg by mouth daily.      cyclobenzaprine 10 MG tablet   Commonly known as: FLEXERIL   Take 10 mg by mouth 2 (two) times daily as needed. For sleep & muscle relaxation      diazepam 5 MG tablet   Commonly known as: VALIUM   Take 5 mg by mouth every 8 (eight) hours as needed. For anxiety      diclofenac sodium 1 % Gel   Commonly known as: VOLTAREN   Apply 1 application topically 4 (four) times daily as needed. To knee for pain      DULoxetine 60 MG capsule   Commonly known as: CYMBALTA   Take 60 mg by mouth daily.      fluticasone 50 MCG/ACT nasal spray   Commonly known as: FLONASE   Place 2 sprays into the nose daily.      guaiFENesin-dextromethorphan 100-10 MG/5ML syrup   Commonly known as: ROBITUSSIN DM   Take 5 mLs by mouth every 4 (four) hours  as needed for cough.      levalbuterol 45 MCG/ACT inhaler   Commonly known as: XOPENEX HFA   Inhale 1-2 puffs into the lungs every 4 (four) hours as needed. For shortness of breath      montelukast 10 MG tablet   Commonly known as: SINGULAIR   Take 10 mg by mouth every morning.      moxifloxacin 400 MG tablet   Commonly known as: AVELOX   Take 1 tablet (400 mg total) by mouth daily at 6 PM.      olopatadine 0.1 % ophthalmic solution   Commonly known as: PATANOL   Place 1 drop into both eyes 2 (two) times daily.      omeprazole 40 MG capsule   Commonly known as: PRILOSEC   Take 1 capsule (40 mg total) by mouth daily.      oxyCODONE-acetaminophen 10-325 MG per tablet   Commonly known as: PERCOCET   Take 1 tablet by mouth every 4 (four) hours as needed for pain.      predniSONE 20 MG tablet   Commonly known as: DELTASONE   Take 3 tablets  daily for 3 days, then 2 tablets daily for 3 days, then 1 tablet daily for 3 days, then half tablet daily for 3 days, then STOP.      tiotropium 18 MCG inhalation capsule   Commonly known as: SPIRIVA   Place 1 capsule (18 mcg total) into inhaler and inhale daily.      TUSSIONEX PENNKINETIC ER 10-8 MG/5ML Lqcr   Generic drug: chlorpheniramine-HYDROcodone   Take 5 mLs by mouth every 12 (twelve) hours as needed. For cough      zolpidem 10 MG tablet   Commonly known as: AMBIEN   Take 10 mg by mouth at bedtime as needed. For sleep            Discharged Condition: Stable, improved.    Consults: None.  Significant Diagnostic Studies: Portable Chest 1 View  10/04/2011  *RADIOLOGY REPORT*  Clinical Data: Respiratory distress.  PORTABLE CHEST - 1 VIEW  Comparison: Chest x-ray to 12/01/2011.  Findings: The cardiac silhouette, mediastinal and hilar contours are within normal limits and stable.  The lung shows slight improved lung aeration with decreased in vascular congestion and areas of atelectasis.  No pleural effusions.  IMPRESSION:  Improving lung aeration.  Original Report Authenticated By: P. Loralie Champagne, M.D.   Dg Chest Port 1 View  10/03/2011  *RADIOLOGY REPORT*  Clinical Data: Respiratory distress  PORTABLE CHEST - 1 VIEW  Comparison: 08/09/2011  Findings: Mild cardiac enlargement and vascular congestion centrally.  No definite pneumonia, collapse, consolidation, effusion or pneumothorax.  Trachea midline.  IMPRESSION: Cardiomegaly with vascular congestion.  Stable exam  Original Report Authenticated By: Judie Petit. Ruel Favors, M.D.    Lab Results: Basic Metabolic Panel:  Basename 10/07/11 0450 10/06/11 0451  NA 139 138  K 4.2 4.2  CL 96 99  CO2 37* 30  GLUCOSE 139* 130*  BUN 20 17  CREATININE 0.50 0.58  CALCIUM 9.1 9.2  MG -- --  PHOS -- --       CBC:  Basename 10/07/11 0450 10/06/11 0451  WBC 5.9 7.9  NEUTROABS -- --  HGB 14.3 15.2*  HCT 48.2* 48.0*  MCV 91.6 91.3  PLT 162 141*    Recent Results (from the past 240 hour(s))  MRSA PCR SCREENING     Status: Abnormal   Collection Time   10/03/11  4:40 PM      Component Value Range Status Comment   MRSA by PCR POSITIVE (*) NEGATIVE  Final      Hospital Course: This 56 year old lady, with ongoing tobacco abuse despite an admission  approximately one month ago, presented with type II respiratory failure once again requiring BiPAP. On this occasion she was very confused and  from CO2 retention. She normally takes 2 L oxygen per minute only at night. Unfortunately, she continues to smoke cigarettes. She is obese. Fortunately she did not require BiPAP very long and with the institution of intravenous steroids and antibiotics she made an improvement. For the last 24-36 hours she has been very stable, albeit requiring 4 L of oxygen per minute. Her wheezing today is better and she feels better. Her chest x-ray did not show any evidence of pneumonia. There is no evidence of congestive heart failure.  Discharge Exam: Blood pressure 156/101, pulse 101, temperature  97.6 F (36.4 C), temperature source Axillary, resp. rate 22, height 5\' 6"  (1.676 m), weight 104.3 kg (229 lb 15 oz), SpO2 98.00%. She looks better. There is no peripheral or central cyanosis. There is slight increased  work of breathing especially when she exerts herself. Her lungs show bilateral wheezing but this is her baseline. Her wheezing is not tight. Heart sounds are present and normal and in sinus rhythm. She is alert and orientated without any focal neurological signs.  Disposition: Home. She will receive a prolonged tapering course of prednisone together with a further 5 day course of antibiotics. She will need to follow with her primary care physician in approximately one week's time. I suggested to her that Wellbutrin dose can be increased, this will also help her quit smoking. Also, I stressed to her the importance of keeping oxygen on for least 18 hours a day.  Discharge Orders    Future Orders Please Complete By Expires   Diet - low sodium heart healthy      Increase activity slowly         Follow-up Information    Follow up with Rudi Heap, MD. Schedule an appointment as soon as possible for a visit in 1 week.         SignedWilson Singer Pager [phone]  10/08/2011, 7:43 AM

## 2011-10-08 NOTE — Progress Notes (Signed)
Patient d/c home with family No c/o pain at d/c Left floor via wheelchair, accompanied by staff Verbalized understanding of d/c instructions, RX's and when to follow up with MD. Marney Setting, Kae Heller

## 2011-10-08 NOTE — Progress Notes (Signed)
Called to schedule D/C follow up appointment with Dr. Christell Constant at [facility] the office informed me that Ms.Magnussen had been dismissed from their practice and she is aware of this. Discharge follow up appointment was made with MD that was on call on her admission date. Patient was made aware that she will need to find a new Primary MD on her own. Myrtice Lowdermilk [employer]

## 2011-10-22 ENCOUNTER — Ambulatory Visit: Payer: Medicare Other | Admitting: Internal Medicine

## 2011-11-01 DIAGNOSIS — M47817 Spondylosis without myelopathy or radiculopathy, lumbosacral region: Secondary | ICD-10-CM | POA: Diagnosis not present

## 2011-11-01 DIAGNOSIS — M5137 Other intervertebral disc degeneration, lumbosacral region: Secondary | ICD-10-CM | POA: Diagnosis not present

## 2011-11-01 DIAGNOSIS — M5380 Other specified dorsopathies, site unspecified: Secondary | ICD-10-CM | POA: Diagnosis not present

## 2011-11-01 DIAGNOSIS — Z79899 Other long term (current) drug therapy: Secondary | ICD-10-CM | POA: Diagnosis not present

## 2011-11-09 DIAGNOSIS — G4700 Insomnia, unspecified: Secondary | ICD-10-CM | POA: Diagnosis not present

## 2011-11-09 DIAGNOSIS — E559 Vitamin D deficiency, unspecified: Secondary | ICD-10-CM | POA: Diagnosis not present

## 2011-11-09 DIAGNOSIS — E785 Hyperlipidemia, unspecified: Secondary | ICD-10-CM | POA: Diagnosis not present

## 2011-11-09 DIAGNOSIS — J309 Allergic rhinitis, unspecified: Secondary | ICD-10-CM | POA: Diagnosis not present

## 2011-11-09 DIAGNOSIS — J449 Chronic obstructive pulmonary disease, unspecified: Secondary | ICD-10-CM | POA: Diagnosis not present

## 2011-11-09 DIAGNOSIS — J441 Chronic obstructive pulmonary disease with (acute) exacerbation: Secondary | ICD-10-CM | POA: Diagnosis not present

## 2011-11-15 ENCOUNTER — Encounter (HOSPITAL_COMMUNITY): Payer: Self-pay | Admitting: *Deleted

## 2011-11-15 ENCOUNTER — Emergency Department (HOSPITAL_COMMUNITY)
Admission: EM | Admit: 2011-11-15 | Discharge: 2011-11-15 | Disposition: A | Discharge status: Home / Self Care | Payer: Medicare Other | Attending: Emergency Medicine | Admitting: Emergency Medicine

## 2011-11-15 ENCOUNTER — Emergency Department (HOSPITAL_COMMUNITY): Payer: Medicare Other

## 2011-11-15 ENCOUNTER — Other Ambulatory Visit: Payer: Self-pay

## 2011-11-15 DIAGNOSIS — I6350 Cerebral infarction due to unspecified occlusion or stenosis of unspecified cerebral artery: Secondary | ICD-10-CM | POA: Diagnosis not present

## 2011-11-15 DIAGNOSIS — I6789 Other cerebrovascular disease: Secondary | ICD-10-CM | POA: Diagnosis not present

## 2011-11-15 DIAGNOSIS — I639 Cerebral infarction, unspecified: Secondary | ICD-10-CM

## 2011-11-15 DIAGNOSIS — R5381 Other malaise: Secondary | ICD-10-CM | POA: Diagnosis not present

## 2011-11-15 DIAGNOSIS — IMO0001 Reserved for inherently not codable concepts without codable children: Secondary | ICD-10-CM | POA: Diagnosis not present

## 2011-11-15 DIAGNOSIS — Z79899 Other long term (current) drug therapy: Secondary | ICD-10-CM | POA: Insufficient documentation

## 2011-11-15 DIAGNOSIS — R918 Other nonspecific abnormal finding of lung field: Secondary | ICD-10-CM | POA: Diagnosis not present

## 2011-11-15 DIAGNOSIS — R4701 Aphasia: Secondary | ICD-10-CM | POA: Diagnosis not present

## 2011-11-15 DIAGNOSIS — R4789 Other speech disturbances: Secondary | ICD-10-CM | POA: Diagnosis not present

## 2011-11-15 DIAGNOSIS — R5383 Other fatigue: Secondary | ICD-10-CM | POA: Insufficient documentation

## 2011-11-15 LAB — DIFFERENTIAL
Eosinophils Absolute: 0.1 10*3/uL (ref 0.0–0.7)
Eosinophils Relative: 2 % (ref 0–5)
Lymphocytes Relative: 36 % (ref 12–46)
Lymphs Abs: 2.3 10*3/uL (ref 0.7–4.0)
Monocytes Absolute: 0.6 10*3/uL (ref 0.1–1.0)

## 2011-11-15 LAB — BASIC METABOLIC PANEL
BUN: 8 mg/dL (ref 6–23)
CO2: 32 mEq/L (ref 19–32)
Calcium: 9.1 mg/dL (ref 8.4–10.5)
Creatinine, Ser: 0.7 mg/dL (ref 0.50–1.10)
GFR calc non Af Amer: >90 mL/min (ref >90)
Glucose, Bld: 95 mg/dL (ref 70–99)
Sodium: 140 mEq/L (ref 135–145)

## 2011-11-15 LAB — CBC
HCT: 41.4 % (ref 36.0–46.0)
MCH: 29.2 pg (ref 26.0–34.0)
MCV: 91.6 fL (ref 78.0–100.0)
Platelets: 218 10*3/uL (ref 150–400)
RBC: 4.52 MIL/uL (ref 3.87–5.11)

## 2011-11-15 MED ORDER — OXYCODONE-ACETAMINOPHEN 5-325 MG PO TABS
1.0000 | ORAL_TABLET | Freq: Once | ORAL | Status: AC
  Administered 2011-11-15: 1 via ORAL
  Filled 2011-11-15: qty 1

## 2011-11-15 MED ORDER — ASPIRIN 81 MG PO CHEW: 325.0000 mg | CHEWABLE_TABLET | Freq: Every day | ORAL | Status: AC

## 2011-11-15 MED ORDER — ASPIRIN 325 MG PO TABS
325.0000 mg | ORAL_TABLET | Freq: Once | ORAL | Status: AC
  Administered 2011-11-15: 325 mg via ORAL
  Filled 2011-11-15: qty 1

## 2011-11-15 NOTE — ED Notes (Signed)
Pt returned from ct scan

## 2011-11-15 NOTE — ED Notes (Signed)
See triage note.

## 2011-11-15 NOTE — Discharge Instructions (Signed)
CT without evidence of stroke today stool possible small stroke not showing up we'll need MRI other concern would be for brain tumor will need MRI recommend following up with neurology make an appointment also follow up with your primary care doctor in [place] they can arrange MRI for you as well. Start taking one aspirin a day each day. Return for any new or worse symptoms at all.

## 2011-11-15 NOTE — ED Notes (Signed)
Dr. Deretha Emory aware of pt in department and onset of symptoms.

## 2011-11-15 NOTE — ED Notes (Signed)
Pt presents to ed with one week history of trouble speaking, left knee pain, chronic back pain,

## 2011-11-15 NOTE — ED Provider Notes (Signed)
History   This chart was scribed for Bethany Jakes, MD by Sofie Rower. The patient was seen in room APA07/APA07 and the patient's care was started at 5:30PM.    CSN: [id number]  Arrival date & time 11/15/11  1638   First MD Initiated Contact with Patient 11/15/11 1717      Chief Complaint  Patient presents with  . Cerebrovascular Accident  . Aphasia    (Consider location/radiation/quality/duration/timing/severity/associated sxs/prior treatment) HPI  Bethany Villa is a 56 y.o. female who, brought in by EMS, presents to the Emergency Department complaining of severe, episodic cerebrovascular accident onset four days ago ago with associated symptoms of back pain, left knee pain, headaches, cough, congestion (3 days), nausea, vomiting, rash and swelling on top of left foot, neck pain. Pt states "she has not seen anyone for her trouble speaking so far". Pt states she called "Dr. Daphine Deutscher who told her it sounded like a mini stroke." Modifying factors include taking percocet which moderately relieves the pain. Pt has a hx of fibromyalgia.  Pt denies any other medical problems, hx of migraines, hx of trouble speaking, fever, diarrhea.   PCP is Dr. Christell Constant.   Past Medical History  Diagnosis Date  . Fibromyalgia   . Asthma   . DJD (degenerative joint disease)   . Chronic pain   . Depression   . Anxiety   . Panic attack   . Sleep disorder   . DJD (degenerative joint disease)     Past Surgical History  Procedure Date  . Abdominal surgery   . Back surgery   . Carpal tunnel release   . Ankle surgery   . Knee surgery   . Cesarean section   . Tonsillectomy   . Tubal ligation     Family History  Problem Relation Age of Onset  . Cancer Mother     beast    History  Substance Use Topics  . Smoking status: Current Everyday Smoker -- 1.0 packs/day for 8 years    Types: Cigarettes  . Smokeless tobacco: Never Used  . Alcohol Use: Yes     occ    OB History    Grav Para Term  Preterm Abortions TAB SAB Ect Mult Living   3 2 2  1  1   2       Review of Systems  All other systems reviewed and are negative.    10 Systems reviewed and are negative for acute change except as noted in the HPI.   Allergies  Penicillins and Sulfa antibiotics  Home Medications   Current Outpatient Rx  Name Route Sig Dispense Refill  . OXYCODONE-ACETAMINOPHEN 10-325 MG PO TABS Oral Take 1 tablet by mouth 5 (five) times daily as needed.    . ALBUTEROL SULFATE HFA 108 (90 BASE) MCG/ACT IN AERS Inhalation Inhale 2 puffs into the lungs every 6 (six) hours as needed. For shortness of breath    . ASPIRIN 81 MG PO CHEW Oral Chew 4 tablets (325 mg total) by mouth daily. 30 tablet 1  . BUDESONIDE-FORMOTEROL FUMARATE 160-4.5 MCG/ACT IN AERO Inhalation Inhale 2 puffs into the lungs 2 (two) times daily. 1 Inhaler 0  . BUPROPION HCL ER (XL) 150 MG PO TB24 Oral Take 150 mg by mouth daily.    Marland Kitchen HYDROCOD POLST-CPM POLST ER 10-8 MG/5ML PO LQCR Oral Take 5 mLs by mouth every 12 (twelve) hours as needed. For cough    . CYCLOBENZAPRINE HCL 10 MG PO TABS Oral Take  10 mg by mouth 2 (two) times daily as needed. For sleep & muscle relaxation    . DIAZEPAM 5 MG PO TABS Oral Take 5 mg by mouth every 8 (eight) hours as needed. For anxiety    . DICLOFENAC SODIUM 1 % TD GEL Topical Apply 1 application topically 4 (four) times daily as needed. To knee for pain    . DULOXETINE HCL 60 MG PO CPEP Oral Take 60 mg by mouth daily.      Marland Kitchen FLUTICASONE PROPIONATE 50 MCG/ACT NA SUSP Nasal Place 2 sprays into the nose daily.    Marland Kitchen LEVALBUTEROL TARTRATE 45 MCG/ACT IN AERO Inhalation Inhale 1-2 puffs into the lungs every 4 (four) hours as needed. For shortness of breath    . MONTELUKAST SODIUM 10 MG PO TABS Oral Take 10 mg by mouth every morning.     . OLOPATADINE HCL 0.1 % OP SOLN Both Eyes Place 1 drop into both eyes 2 (two) times daily.     Marland Kitchen OMEPRAZOLE 40 MG PO CPDR Oral Take 1 capsule (40 mg total) by mouth daily. 30  capsule 11  . PREDNISONE 20 MG PO TABS  Take 3 tablets daily for 3 days, then 2 tablets daily for 3 days, then 1 tablet daily for 3 days, then half tablet daily for 3 days, then STOP. 20 tablet 0  . TIOTROPIUM BROMIDE MONOHYDRATE 18 MCG IN CAPS Inhalation Place 1 capsule (18 mcg total) into inhaler and inhale daily. 30 capsule 2  . ZOLPIDEM TARTRATE 10 MG PO TABS Oral Take 10 mg by mouth at bedtime as needed. For sleep       BP 130/79  Pulse 101  Temp 98.9 F (37.2 C)  Resp 20  Ht 5\' 3"  (1.6 m)  Wt 220 lb (99.791 kg)  BMI 38.97 kg/m2  SpO2 95%  Physical Exam  Nursing note and vitals reviewed. Constitutional: She is oriented to person, place, and time. She appears well-developed and well-nourished.       Speech seems slightly "strange"  HENT:  Head: Normocephalic and atraumatic.  Right Ear: External ear normal.  Left Ear: External ear normal.  Nose: Nose normal.  Eyes: Conjunctivae and EOM are normal. No scleral icterus.  Neck: Neck supple. No thyromegaly present.  Cardiovascular: Normal rate, regular rhythm and normal heart sounds.  Exam reveals no gallop and no friction rub.   No murmur heard. Pulmonary/Chest: Breath sounds normal. No stridor. She has no wheezes. She has no rales. She exhibits no tenderness.  Abdominal: Bowel sounds are normal. She exhibits no distension. There is no tenderness. There is no rebound.  Musculoskeletal: Normal range of motion. She exhibits no edema.  Lymphadenopathy:    She has no cervical adenopathy.  Neurological: She is alert and oriented to person, place, and time. Coordination normal.  Skin: Skin is warm and dry. No erythema.  Psychiatric: She has a normal mood and affect. Her behavior is normal.    ED Course  Procedures (including critical care time)  DIAGNOSTIC STUDIES: Oxygen Saturation is 95% on Bethany, normal by my interpretation.    COORDINATION OF CARE:  Results for orders placed during the hospital encounter of 11/15/11  CBC       Component Value Range   WBC 6.3  4.0 - 10.5 (K/uL)   RBC 4.52  3.87 - 5.11 (MIL/uL)   Hemoglobin 13.2  12.0 - 15.0 (g/dL)   HCT 16.1  09.6 - 04.5 (%)   MCV 91.6  78.0 -  100.0 (fL)   MCH 29.2  26.0 - 34.0 (pg)   MCHC 31.9  30.0 - 36.0 (g/dL)   RDW 08.6  57.8 - 46.9 (%)   Platelets 218  150 - 400 (K/uL)  DIFFERENTIAL      Component Value Range   Neutrophils Relative 53  43 - 77 (%)   Neutro Abs 3.3  1.7 - 7.7 (K/uL)   Lymphocytes Relative 36  12 - 46 (%)   Lymphs Abs 2.3  0.7 - 4.0 (K/uL)   Monocytes Relative 10  3 - 12 (%)   Monocytes Absolute 0.6  0.1 - 1.0 (K/uL)   Eosinophils Relative 2  0 - 5 (%)   Eosinophils Absolute 0.1  0.0 - 0.7 (K/uL)   Basophils Relative 1  0 - 1 (%)   Basophils Absolute 0.0  0.0 - 0.1 (K/uL)  BASIC METABOLIC PANEL      Component Value Range   Sodium 140  135 - 145 (mEq/L)   Potassium 3.4 (*) 3.5 - 5.1 (mEq/L)   Chloride 100  96 - 112 (mEq/L)   CO2 32  19 - 32 (mEq/L)   Glucose, Bld 95  70 - 99 (mg/dL)   BUN 8  6 - 23 (mg/dL)   Creatinine, Ser 6.29  0.50 - 1.10 (mg/dL)   Calcium 9.1  8.4 - 52.8 (mg/dL)   GFR calc non Af Amer >90  >90 (mL/min)   GFR calc Af Amer >90  >90 (mL/min)   Dg Chest 2 View  11/15/2011  *RADIOLOGY REPORT*  Clinical Data: Weakness.  Slurred speech.  Tobacco use.  CHEST - 2 VIEW  Comparison: 10/04/2011  Findings: Mild cardiomegaly noted.  Linear opacity in the posted basal segment right lower lobe is suspicious for subsegmental atelectasis.  This is best seen on the lateral projection.  No findings of pulmonary edema.  Minimal thoracic spondylosis noted.  IMPRESSION: 1.  Mild cardiomegaly.  2.  Suspected subsegmental atelectasis in the right lower lobe.  Original Report Authenticated By: Dellia Cloud, M.D.   Ct Head Wo Contrast  11/15/2011  *RADIOLOGY REPORT*  Clinical Data: Aphasia, stroke.  CT HEAD WITHOUT CONTRAST  Technique:  Contiguous axial images were obtained from the base of the skull through the vertex without  contrast.  Comparison: None.  Findings: The brain stem, cerebellum, cerebral peduncles, thalami, basal ganglia, basilar cisterns, and ventricular system appear unremarkable.  No intracranial hemorrhage, mass lesion, or acute infarction is identified.  IMPRESSION:  1.  No significant abnormality identified.  Original Report Authenticated By: Dellia Cloud, M.D.         Labs Reviewed  BASIC METABOLIC PANEL - Abnormal; Notable for the following:    Potassium 3.4 (*)    All other components within normal limits  CBC  DIFFERENTIAL   Dg Chest 2 View  11/15/2011  *RADIOLOGY REPORT*  Clinical Data: Weakness.  Slurred speech.  Tobacco use.  CHEST - 2 VIEW  Comparison: 10/04/2011  Findings: Mild cardiomegaly noted.  Linear opacity in the posted basal segment right lower lobe is suspicious for subsegmental atelectasis.  This is best seen on the lateral projection.  No findings of pulmonary edema.  Minimal thoracic spondylosis noted.  IMPRESSION: 1.  Mild cardiomegaly.  2.  Suspected subsegmental atelectasis in the right lower lobe.  Original Report Authenticated By: Dellia Cloud, M.D.   Ct Head Wo Contrast  11/15/2011  *RADIOLOGY REPORT*  Clinical Data: Aphasia, stroke.  CT HEAD WITHOUT CONTRAST  Technique:  Contiguous axial images were obtained from the base of the skull through the vertex without contrast.  Comparison: None.  Findings: The brain stem, cerebellum, cerebral peduncles, thalami, basal ganglia, basilar cisterns, and ventricular system appear unremarkable.  No intracranial hemorrhage, mass lesion, or acute infarction is identified.  IMPRESSION:  1.  No significant abnormality identified.  Original Report Authenticated By: Dellia Cloud, M.D.    Date: 11/15/2011  Rate: 97  Rhythm: normal sinus rhythm  QRS Axis: normal  Intervals: normal  ST/T Wave abnormalities: normal  Conduction Disutrbances:none  Narrative Interpretation:   Old EKG Reviewed: none  available Today's EKG does have prolonged QT QT corrected 485 ms.   1. Stroke      5:37PM- EDP at bedside discusses treatment plan concerning cat scan.   MDM  No evidence of stroke on CT patient's speech seems improved or if not normal here patient needs MRI to rule out brain tumor or small stroke not evident on head CT however since symptoms been present for almost a week one would suspect that head CT which showed some abnormality. No other focal findings other than the speech problem. Also patient is on chronic pain meds for fibromyalgia and possible that the slurred speech was related to that but we did not witness here but was witnessed by her friend at home they called the ambulance to bring her to the hospital. Will start patient on aspirin a day patient can followup with her primary care Dr. and/or also given referral to neurology locally here [facility] for further evaluation an MRI.  Since symptoms been present for almost a week admission is not required at this time there is been no worsening of symptoms no exacerbation.      I personally performed the services described in this documentation, which was scribed in my presence. The recorded information has been reviewed and considered.     Bethany Jakes, MD 11/15/11 [phone]

## 2011-11-30 ENCOUNTER — Institutional Professional Consult (permissible substitution): Payer: Medicare Other | Admitting: Internal Medicine

## 2011-12-01 DIAGNOSIS — M47817 Spondylosis without myelopathy or radiculopathy, lumbosacral region: Secondary | ICD-10-CM | POA: Diagnosis not present

## 2011-12-01 DIAGNOSIS — M5380 Other specified dorsopathies, site unspecified: Secondary | ICD-10-CM | POA: Diagnosis not present

## 2011-12-01 DIAGNOSIS — M5137 Other intervertebral disc degeneration, lumbosacral region: Secondary | ICD-10-CM | POA: Diagnosis not present

## 2011-12-10 ENCOUNTER — Other Ambulatory Visit: Payer: Self-pay | Admitting: Family Medicine

## 2011-12-10 DIAGNOSIS — I639 Cerebral infarction, unspecified: Secondary | ICD-10-CM

## 2011-12-14 ENCOUNTER — Other Ambulatory Visit (HOSPITAL_BASED_OUTPATIENT_CLINIC_OR_DEPARTMENT_OTHER): Payer: Self-pay | Admitting: Internal Medicine

## 2011-12-14 ENCOUNTER — Ambulatory Visit (HOSPITAL_COMMUNITY): Admission: RE | Admit: 2011-12-14 | Payer: Medicare Other | Source: Ambulatory Visit

## 2012-01-04 ENCOUNTER — Encounter: Payer: Medicare Other | Admitting: Internal Medicine

## 2012-01-04 NOTE — Progress Notes (Signed)
 This encounter was created in error - please disregard.

## 2012-01-17 ENCOUNTER — Emergency Department (HOSPITAL_COMMUNITY)
Admission: EM | Admit: 2012-01-17 | Discharge: 2012-01-17 | Disposition: A | Discharge status: Home / Self Care | Payer: Medicare Other | Attending: Emergency Medicine | Admitting: Emergency Medicine

## 2012-01-17 ENCOUNTER — Encounter (HOSPITAL_COMMUNITY): Payer: Self-pay | Admitting: *Deleted

## 2012-01-17 DIAGNOSIS — R000 Tachycardia, unspecified: Secondary | ICD-10-CM | POA: Diagnosis not present

## 2012-01-17 DIAGNOSIS — M25519 Pain in unspecified shoulder: Secondary | ICD-10-CM | POA: Insufficient documentation

## 2012-01-17 DIAGNOSIS — IMO0002 Reserved for concepts with insufficient information to code with codable children: Secondary | ICD-10-CM | POA: Insufficient documentation

## 2012-01-17 DIAGNOSIS — X500XXA Overexertion from strenuous movement or load, initial encounter: Secondary | ICD-10-CM | POA: Insufficient documentation

## 2012-01-17 DIAGNOSIS — S46919A Strain of unspecified muscle, fascia and tendon at shoulder and upper arm level, unspecified arm, initial encounter: Secondary | ICD-10-CM

## 2012-01-17 MED ORDER — DIAZEPAM 5 MG PO TABS
5.0000 mg | ORAL_TABLET | Freq: Once | ORAL | Status: AC
  Administered 2012-01-17: 5 mg via ORAL
  Filled 2012-01-17: qty 1

## 2012-01-17 MED ORDER — HYDROCODONE-ACETAMINOPHEN 5-325 MG PO TABS
2.0000 | ORAL_TABLET | Freq: Once | ORAL | Status: AC
  Administered 2012-01-17: 2 via ORAL
  Filled 2012-01-17: qty 2

## 2012-01-17 MED ORDER — CYCLOBENZAPRINE HCL 10 MG PO TABS: 10.0000 mg | ORAL_TABLET | Freq: Three times a day (TID) | ORAL | Status: AC | PRN

## 2012-01-17 MED ORDER — ONDANSETRON HCL 4 MG PO TABS
4.0000 mg | ORAL_TABLET | Freq: Once | ORAL | Status: AC
  Administered 2012-01-17: 4 mg via ORAL
  Filled 2012-01-17: qty 1

## 2012-01-17 MED ORDER — HYDROCODONE-ACETAMINOPHEN 7.5-325 MG PO TABS: 1.0000 | ORAL_TABLET | ORAL | Status: AC | PRN

## 2012-01-17 NOTE — ED Provider Notes (Signed)
History     CSN: [id number]  Arrival date & time 01/17/12  1533   First MD Initiated Contact with Patient 01/17/12 1558      Chief Complaint  Patient presents with  . Shoulder Pain    (Consider location/radiation/quality/duration/timing/severity/associated sxs/prior treatment) Patient is a 56 y.o. female presenting with shoulder pain. The history is provided by the patient.  Shoulder Pain This is a new problem. The current episode started yesterday. The problem occurs constantly. The problem has been gradually worsening. Associated symptoms include arthralgias. Pertinent negatives include no abdominal pain, chest pain, coughing, neck pain or numbness. Exacerbated by: movement or certain positions. She has tried nothing for the symptoms. The treatment provided no relief.    Past Medical History  Diagnosis Date  . Fibromyalgia   . Asthma   . DJD (degenerative joint disease)   . Chronic pain   . Depression   . Anxiety   . Panic attack   . Sleep disorder   . DJD (degenerative joint disease)     Past Surgical History  Procedure Date  . Abdominal surgery   . Back surgery   . Carpal tunnel release   . Ankle surgery   . Knee surgery   . Cesarean section   . Tonsillectomy   . Tubal ligation     Family History  Problem Relation Age of Onset  . Cancer Mother     beast    History  Substance Use Topics  . Smoking status: Current Everyday Smoker -- 1.0 packs/day for 8 years    Types: Cigarettes  . Smokeless tobacco: Never Used  . Alcohol Use: Yes     occ    OB History    Grav Para Term Preterm Abortions TAB SAB Ect Mult Living   3 2 2  1  1   2       Review of Systems  Constitutional: Negative for activity change.       All ROS Neg except as noted in HPI  HENT: Negative for nosebleeds and neck pain.   Eyes: Negative for photophobia and discharge.  Respiratory: Negative for cough, shortness of breath and wheezing.   Cardiovascular: Negative for chest pain and  palpitations.  Gastrointestinal: Negative for abdominal pain and blood in stool.  Genitourinary: Negative for dysuria, frequency and hematuria.  Musculoskeletal: Positive for arthralgias. Negative for back pain.  Skin: Negative.   Neurological: Negative for dizziness, seizures, speech difficulty and numbness.  Psychiatric/Behavioral: Negative for hallucinations and confusion.    Allergies  Penicillins and Sulfa antibiotics  Home Medications   Current Outpatient Rx  Name Route Sig Dispense Refill  . ALBUTEROL SULFATE HFA 108 (90 BASE) MCG/ACT IN AERS Inhalation Inhale 2 puffs into the lungs every 6 (six) hours as needed. For shortness of breath    . ASPIRIN 81 MG PO CHEW Oral Chew 4 tablets (325 mg total) by mouth daily. 30 tablet 1  . BUDESONIDE-FORMOTEROL FUMARATE 160-4.5 MCG/ACT IN AERO Inhalation Inhale 2 puffs into the lungs 2 (two) times daily. 1 Inhaler 0  . BUPROPION HCL ER (XL) 150 MG PO TB24 Oral Take 150 mg by mouth daily.    . CYCLOBENZAPRINE HCL 10 MG PO TABS Oral Take 10 mg by mouth 2 (two) times daily as needed. For sleep & muscle relaxation    . DIAZEPAM 5 MG PO TABS Oral Take 5 mg by mouth every 8 (eight) hours as needed. For anxiety    . DICLOFENAC SODIUM 1 % TD  GEL Topical Apply 1 application topically 4 (four) times daily as needed. To knee for pain    . DULOXETINE HCL 60 MG PO CPEP Oral Take 60 mg by mouth daily.      Marland Kitchen LEVALBUTEROL TARTRATE 45 MCG/ACT IN AERO Inhalation Inhale 1-2 puffs into the lungs every 4 (four) hours as needed. For shortness of breath    . MONTELUKAST SODIUM 10 MG PO TABS Oral Take 10 mg by mouth every morning.     Marland Kitchen NICOTINE 10 MG IN INHA Inhalation Inhale 1 puff into the lungs as needed. For smoking cessation.    . OLOPATADINE HCL 0.1 % OP SOLN Both Eyes Place 1 drop into both eyes 2 (two) times daily.     Marland Kitchen TIOTROPIUM BROMIDE MONOHYDRATE 18 MCG IN CAPS Inhalation Place 1 capsule (18 mcg total) into inhaler and inhale daily. 30 capsule 2     BP 138/85  Pulse 105  Temp(Src) 97.9 F (36.6 C) (Oral)  Resp 20  Ht 5\' 4"  (1.626 m)  Wt 220 lb (99.791 kg)  BMI 37.76 kg/m2  SpO2 98%  Physical Exam  Nursing note and vitals reviewed. Constitutional: She is oriented to person, place, and time. She appears well-developed and well-nourished.  Non-toxic appearance.  HENT:  Head: Normocephalic.  Right Ear: Tympanic membrane and external ear normal.  Left Ear: Tympanic membrane and external ear normal.  Eyes: EOM and lids are normal. Pupils are equal, round, and reactive to light.  Neck: Normal range of motion. Neck supple. Carotid bruit is not present.  Cardiovascular: Regular rhythm, normal heart sounds, intact distal pulses and normal pulses.  Tachycardia present.   Pulmonary/Chest: Breath sounds normal. No respiratory distress.  Abdominal: Soft. Bowel sounds are normal. There is no tenderness. There is no guarding.  Musculoskeletal: Normal range of motion.       There is full range of motion of the fingers, wrists, and elbow of the right upper extremity. There is pain to palpation of the right scapular area, extending up to the right neck area. There is no dislocation or deformity.  Lymphadenopathy:       Head (right side): No submandibular adenopathy present.       Head (left side): No submandibular adenopathy present.    She has no cervical adenopathy.  Neurological: She is alert and oriented to person, place, and time. She has normal strength. No cranial nerve deficit or sensory deficit.  Skin: Skin is warm and dry.  Psychiatric: She has a normal mood and affect. Her speech is normal.    ED Course  Procedures (including critical care time)  Labs Reviewed - No data to display No results found.   No diagnosis found.    MDM  I have reviewed nursing notes, vital signs, and all appropriate lab and imaging results for this patient. Patient has a muscle strain involving the right trapezius and shoulder region.  Prescription for Flexeril 10 mg #21 tablets, and Norco 7.5 mg, #20 tablets given to the patient.       Kathie Dike, [place] 01/19/12 1535

## 2012-01-17 NOTE — Discharge Instructions (Signed)
Please continue your diclofenac gel. Add flexeril and Norco to your current medications. Please  Take your these medications with you to your primary MD to add to your records. Rest on a heating pad for 20 min. Intervals.Shoulder Sprain A shoulder sprain is the result of damage to the tough, fiber-like tissues (ligaments) that help hold your shoulder in place. The ligaments may be stretched or torn. Besides the main shoulder joint (the ball and socket), there are several smaller joints that connect the bones in this area. A sprain usually involves one of those joints. Most often it is the acromioclavicular (or AC) joint. That is the joint that connects the collarbone (clavicle) and the shoulder blade (scapula) at the top point of the shoulder blade (acromion). A shoulder sprain is a mild form of what is called a shoulder separation. Recovering from a shoulder sprain may take some time. For some, pain lingers for several months. Most people recover without long term problems. CAUSES   A shoulder sprain is usually caused by some kind of trauma. This might be:   Falling on an outstretched arm.   Being hit hard on the shoulder.   Twisting the arm.   Shoulder sprains are more likely to occur in people who:   Play sports.   Have balance or coordination problems.  SYMPTOMS   Pain when you move your shoulder.   Limited ability to move the shoulder.   Swelling and tenderness on top of the shoulder.   Redness or warmth in the shoulder.   Bruising.   A change in the shape of the shoulder.  DIAGNOSIS  Your healthcare provider may:  Ask about your symptoms.   Ask about recent activity that might have caused those symptoms.   Examine your shoulder. You may be asked to do simple exercises to test movement. The other shoulder will be examined for comparison.   Order some tests that provide a look inside the body. They can show the extent of the injury. The tests could include:   X-rays.    CT (computed tomography) scan.   MRI (magnetic resonance imaging) scan.  RISKS AND COMPLICATIONS  Loss of full shoulder motion.   Ongoing shoulder pain.  TREATMENT  How long it takes to recover from a shoulder sprain depends on how severe it was. Treatment options may include:  Rest. You should not use the arm or shoulder until it heals.   Ice. For 2 or 3 days after the injury, put an ice pack on the shoulder up to 4 times a day. It should stay on for 15 to 20 minutes each time. Wrap the ice in a towel so it does not touch your skin.   Over-the-counter medicine to relieve pain.   A sling or brace. This will keep the arm still while the shoulder is healing.   Physical therapy or rehabilitation exercises. These will help you regain strength and motion. Ask your healthcare provider when it is OK to begin these exercises.   Surgery. The need for surgery is rare with a sprained shoulder, but some people may need surgery to keep the joint in place and reduce pain.  HOME CARE INSTRUCTIONS   Ask your healthcare provider about what you should and should not do while your shoulder heals.   Make sure you know how to apply ice to the correct area of your shoulder.   Talk with your healthcare provider about which medications should be used for pain and swelling.  If rehabilitation therapy will be needed, ask your healthcare provider to refer you to a therapist. If it is not recommended, then ask about at-home exercises. Find out when exercise should begin.  SEEK MEDICAL CARE IF:  Your pain, swelling, or redness at the joint increases. SEEK IMMEDIATE MEDICAL CARE IF:   You have a fever.   You cannot move your arm or shoulder.  Document Released: 01/02/2009 Document Revised: 08/05/2011 Document Reviewed: 01/02/2009 [facility] Patient Information 2012 [place], [place].

## 2012-01-17 NOTE — ED Notes (Signed)
Pain rt shoulder and neck , onset last night  After pulling out a sofa..  Pain is more in scapular region, and hurts to take a deep breath

## 2012-01-22 NOTE — ED Provider Notes (Signed)
Medical screening examination/treatment/procedure(s) were performed by non-physician practitioner and as supervising physician I was immediately available for consultation/collaboration. Quintell Bonnin, MD, FACEP   Cannon Quinton L Mardella Nuckles, MD 01/22/12 2231 

## 2012-02-02 DIAGNOSIS — M5137 Other intervertebral disc degeneration, lumbosacral region: Secondary | ICD-10-CM | POA: Diagnosis not present

## 2012-02-02 DIAGNOSIS — M47817 Spondylosis without myelopathy or radiculopathy, lumbosacral region: Secondary | ICD-10-CM | POA: Diagnosis not present

## 2012-02-02 DIAGNOSIS — Z79899 Other long term (current) drug therapy: Secondary | ICD-10-CM | POA: Diagnosis not present

## 2012-02-02 DIAGNOSIS — M5380 Other specified dorsopathies, site unspecified: Secondary | ICD-10-CM | POA: Diagnosis not present

## 2012-03-27 ENCOUNTER — Emergency Department (HOSPITAL_COMMUNITY): Payer: Medicare Other

## 2012-03-27 ENCOUNTER — Emergency Department (HOSPITAL_COMMUNITY)
Admission: EM | Admit: 2012-03-27 | Discharge: 2012-03-27 | Disposition: A | Discharge status: Home / Self Care | Payer: Medicare Other | Attending: Emergency Medicine | Admitting: Emergency Medicine

## 2012-03-27 ENCOUNTER — Encounter (HOSPITAL_COMMUNITY): Payer: Self-pay | Admitting: *Deleted

## 2012-03-27 DIAGNOSIS — J45909 Unspecified asthma, uncomplicated: Secondary | ICD-10-CM | POA: Insufficient documentation

## 2012-03-27 DIAGNOSIS — M1710 Unilateral primary osteoarthritis, unspecified knee: Secondary | ICD-10-CM | POA: Diagnosis not present

## 2012-03-27 DIAGNOSIS — M25569 Pain in unspecified knee: Secondary | ICD-10-CM | POA: Diagnosis not present

## 2012-03-27 DIAGNOSIS — R52 Pain, unspecified: Secondary | ICD-10-CM | POA: Diagnosis not present

## 2012-03-27 DIAGNOSIS — IMO0001 Reserved for inherently not codable concepts without codable children: Secondary | ICD-10-CM | POA: Insufficient documentation

## 2012-03-27 DIAGNOSIS — Z7982 Long term (current) use of aspirin: Secondary | ICD-10-CM | POA: Diagnosis not present

## 2012-03-27 DIAGNOSIS — Z79899 Other long term (current) drug therapy: Secondary | ICD-10-CM | POA: Insufficient documentation

## 2012-03-27 DIAGNOSIS — G8929 Other chronic pain: Secondary | ICD-10-CM | POA: Insufficient documentation

## 2012-03-27 DIAGNOSIS — F341 Dysthymic disorder: Secondary | ICD-10-CM | POA: Diagnosis not present

## 2012-03-27 DIAGNOSIS — M25469 Effusion, unspecified knee: Secondary | ICD-10-CM | POA: Diagnosis not present

## 2012-03-27 DIAGNOSIS — F17200 Nicotine dependence, unspecified, uncomplicated: Secondary | ICD-10-CM | POA: Diagnosis not present

## 2012-03-27 MED ORDER — OXYCODONE-ACETAMINOPHEN 5-325 MG PO TABS
1.0000 | ORAL_TABLET | Freq: Once | ORAL | Status: AC
  Administered 2012-03-27: 1 via ORAL
  Filled 2012-03-27: qty 1

## 2012-03-27 MED ORDER — OXYCODONE-ACETAMINOPHEN 5-325 MG PO TABS: 1.0000 | ORAL_TABLET | ORAL | Status: AC | PRN

## 2012-03-27 NOTE — ED Notes (Addendum)
Pt states left knee pain. States she heard "something" in the knee last night and has been unable to straighten left knee since. NAD. Pt also states she is in need of a knee replacement.

## 2012-03-27 NOTE — ED Provider Notes (Signed)
History     CSN: [id number]  Arrival date & time 03/27/12  1245   First MD Initiated Contact with Patient 03/27/12 1350      Chief Complaint  Patient presents with  . Knee Pain    (Consider location/radiation/quality/duration/timing/severity/associated sxs/prior treatment) HPI Comments: Patient c/o acute on chronic left knee pain.  States that her left knee "popped" while walking and pain has been worse since.  States it "feels like its going to give out" with weight bearing.  Pain worse with full extension.  She denies fall, numbness, swelling or color change.  States she has been told that she needs a knee replacement  Patient is a 56 y.o. female presenting with knee pain. The history is provided by the patient.  Knee Pain This is a recurrent problem. The current episode started yesterday. The problem occurs constantly. The problem has been gradually worsening. Associated symptoms include arthralgias. Pertinent negatives include no chills, fever, joint swelling, nausea, neck pain, numbness, rash, vomiting or weakness. The symptoms are aggravated by standing, twisting, walking and bending. She has tried NSAIDs and oral narcotics for the symptoms. The treatment provided mild relief.    Past Medical History  Diagnosis Date  . Fibromyalgia   . Asthma   . DJD (degenerative joint disease)   . Chronic pain   . Depression   . Anxiety   . Panic attack   . Sleep disorder   . DJD (degenerative joint disease)     Past Surgical History  Procedure Date  . Abdominal surgery   . Back surgery   . Carpal tunnel release   . Ankle surgery   . Knee surgery   . Cesarean section   . Tonsillectomy   . Tubal ligation     Family History  Problem Relation Age of Onset  . Cancer Mother     beast    History  Substance Use Topics  . Smoking status: Current Everyday Smoker -- 1.0 packs/day for 8 years    Types: Cigarettes  . Smokeless tobacco: Never Used  . Alcohol Use: Yes     occ     OB History    Grav Para Term Preterm Abortions TAB SAB Ect Mult Living   3 2 2  1  1   2       Review of Systems  Constitutional: Negative for fever and chills.  HENT: Negative for neck pain.   Gastrointestinal: Negative for nausea and vomiting.  Genitourinary: Negative for dysuria and difficulty urinating.  Musculoskeletal: Positive for arthralgias. Negative for joint swelling.  Skin: Negative for color change, rash and wound.  Neurological: Negative for weakness and numbness.  All other systems reviewed and are negative.    Allergies  Penicillins and Sulfa antibiotics  Home Medications   Current Outpatient Rx  Name Route Sig Dispense Refill  . ALBUTEROL SULFATE (2.5 MG/3ML) 0.083% IN NEBU Nebulization Take 2.5 mg by nebulization every 6 (six) hours as needed. For shortness of breath    . ASPIRIN 81 MG PO CHEW Oral Chew 4 tablets (325 mg total) by mouth daily. 30 tablet 1  . BENZONATATE 100 MG PO CAPS Oral Take 100 mg by mouth 3 (three) times daily as needed. For cough    . BUDESONIDE-FORMOTEROL FUMARATE 160-4.5 MCG/ACT IN AERO Inhalation Inhale 2 puffs into the lungs 2 (two) times daily. 1 Inhaler 0  . BUPROPION HCL ER (XL) 150 MG PO TB24 Oral Take 150 mg by mouth daily.    Marland Kitchen  CYCLOBENZAPRINE HCL 10 MG PO TABS Oral Take 10 mg by mouth 2 (two) times daily as needed. For sleep & muscle relaxation    . DIAZEPAM 5 MG PO TABS Oral Take 5 mg by mouth every 8 (eight) hours as needed. For anxiety    . DICLOFENAC SODIUM 1 % TD GEL Topical Apply 1 application topically 4 (four) times daily as needed. To knee for pain    . DULOXETINE HCL 60 MG PO CPEP Oral Take 60 mg by mouth daily.      Marland Kitchen LEVALBUTEROL TARTRATE 45 MCG/ACT IN AERO Inhalation Inhale 1-2 puffs into the lungs every 4 (four) hours as needed. For shortness of breath    . MONTELUKAST SODIUM 10 MG PO TABS Oral Take 10 mg by mouth daily.     Marland Kitchen NICOTINE 10 MG IN INHA Inhalation Inhale 1 puff into the lungs as needed. For smoking  cessation.    . OLOPATADINE HCL 0.1 % OP SOLN Both Eyes Place 1 drop into both eyes 2 (two) times daily.     . OXYCODONE-ACETAMINOPHEN 10-325 MG PO TABS Oral Take 1 tablet by mouth every 6 (six) hours as needed. For pain    . PREGABALIN 150 MG PO CAPS Oral Take 150 mg by mouth 2 (two) times daily.    Marland Kitchen TIOTROPIUM BROMIDE MONOHYDRATE 18 MCG IN CAPS Inhalation Place 1 capsule (18 mcg total) into inhaler and inhale daily. 30 capsule 2  . TRAZODONE HCL 150 MG PO TABS Oral Take 75 mg by mouth at bedtime.    Marland Kitchen ZOLPIDEM TARTRATE 10 MG PO TABS Oral Take 10 mg by mouth at bedtime as needed. For sleep      BP 128/81  Pulse 95  Temp 98.3 F (36.8 C) (Oral)  Resp 20  Ht 5\' 4"  (1.626 m)  Wt 200 lb (90.719 kg)  BMI 34.33 kg/m2  SpO2 96%  Physical Exam  Nursing note and vitals reviewed. Constitutional: She is oriented to person, place, and time. She appears well-developed and well-nourished. No distress.  Cardiovascular: Normal rate, regular rhythm, normal heart sounds and intact distal pulses.   Pulmonary/Chest: Effort normal and breath sounds normal.  Musculoskeletal: She exhibits tenderness.       Left knee: She exhibits decreased range of motion. She exhibits no swelling, no effusion, no laceration, no erythema, normal patellar mobility and no bony tenderness. tenderness found. Medial joint line and lateral joint line tenderness noted. No patellar tendon tenderness noted.       Legs:      ttp of the medial and lateral left knee.  No erythema, bruising or deformity.    Neurological: She is alert and oriented to person, place, and time. She exhibits normal muscle tone. Coordination normal.  Skin: Skin is warm and dry. No erythema.    ED Course  Procedures (including critical care time)  Labs Reviewed - No data to display Dg Knee Complete 4 Views Left  03/27/2012  *RADIOLOGY REPORT*  Clinical Data: Knee pain, popping sensation  LEFT KNEE - COMPLETE 4+ VIEW  Comparison: None.  Findings:  Tricompartmental osteoarthritis with joint space narrowing, sclerosis and osteophytes.  These changes are most pronounced in the lateral compartment.  Small effusion on the lateral view.  Normal alignment.  Negative for fracture.  IMPRESSION: Tricompartmental osteoarthritis.  Small joint effusion.  No acute osseous finding.  Original Report Authenticated By: Judie Petit. Ruel Favors, M.D.        MDM    Pt has crutches a  home.  Hx of chronic knee pain.  States she has been told by her orthopedist that she needs knee replacement.  Pain likely related to ligamentous injury.  No fall.  Doubt infectious process.   Pt prefers to f/u with her ortho in [place].    The patient appears reasonably screened and/or stabilized for discharge and I doubt any other medical condition or other [facility] requiring further screening, evaluation, or treatment in the ED at this time prior to discharge.   Prescribed Percocet # [id number]       Aybree Lanyon L. Jaydin Jalomo, [place] 03/29/12 1353

## 2012-03-31 DIAGNOSIS — L0293 Carbuncle, unspecified: Secondary | ICD-10-CM | POA: Diagnosis not present

## 2012-03-31 DIAGNOSIS — L0292 Furuncle, unspecified: Secondary | ICD-10-CM | POA: Diagnosis not present

## 2012-03-31 NOTE — ED Provider Notes (Signed)
Medical screening examination/treatment/procedure(s) were performed by non-physician practitioner and as supervising physician I was immediately available for consultation/collaboration.  Larena Ohnemus R. Lamorris Knoblock, MD 03/31/12 1052 

## 2012-04-14 DIAGNOSIS — J209 Acute bronchitis, unspecified: Secondary | ICD-10-CM | POA: Diagnosis not present

## 2012-04-14 DIAGNOSIS — R609 Edema, unspecified: Secondary | ICD-10-CM | POA: Diagnosis not present

## 2012-07-05 DIAGNOSIS — M5380 Other specified dorsopathies, site unspecified: Secondary | ICD-10-CM | POA: Diagnosis not present

## 2012-07-05 DIAGNOSIS — M47817 Spondylosis without myelopathy or radiculopathy, lumbosacral region: Secondary | ICD-10-CM | POA: Diagnosis not present

## 2012-07-05 DIAGNOSIS — M5137 Other intervertebral disc degeneration, lumbosacral region: Secondary | ICD-10-CM | POA: Diagnosis not present

## 2012-07-06 DIAGNOSIS — M6530 Trigger finger, unspecified finger: Secondary | ICD-10-CM | POA: Diagnosis not present

## 2012-07-06 DIAGNOSIS — M1710 Unilateral primary osteoarthritis, unspecified knee: Secondary | ICD-10-CM | POA: Diagnosis not present

## 2012-07-15 ENCOUNTER — Emergency Department (HOSPITAL_COMMUNITY)
Admission: EM | Admit: 2012-07-15 | Discharge: 2012-07-15 | Disposition: A | Discharge status: Home / Self Care | Payer: Medicare Other | Attending: Emergency Medicine | Admitting: Emergency Medicine

## 2012-07-15 ENCOUNTER — Encounter (HOSPITAL_COMMUNITY): Payer: Self-pay | Admitting: *Deleted

## 2012-07-15 DIAGNOSIS — R109 Unspecified abdominal pain: Secondary | ICD-10-CM | POA: Diagnosis not present

## 2012-07-15 DIAGNOSIS — Z79899 Other long term (current) drug therapy: Secondary | ICD-10-CM | POA: Insufficient documentation

## 2012-07-15 DIAGNOSIS — R0602 Shortness of breath: Secondary | ICD-10-CM | POA: Diagnosis not present

## 2012-07-15 DIAGNOSIS — G8929 Other chronic pain: Secondary | ICD-10-CM | POA: Diagnosis not present

## 2012-07-15 DIAGNOSIS — IMO0002 Reserved for concepts with insufficient information to code with codable children: Secondary | ICD-10-CM | POA: Diagnosis not present

## 2012-07-15 DIAGNOSIS — J441 Chronic obstructive pulmonary disease with (acute) exacerbation: Secondary | ICD-10-CM | POA: Insufficient documentation

## 2012-07-15 DIAGNOSIS — F341 Dysthymic disorder: Secondary | ICD-10-CM | POA: Insufficient documentation

## 2012-07-15 DIAGNOSIS — R059 Cough, unspecified: Secondary | ICD-10-CM | POA: Diagnosis not present

## 2012-07-15 DIAGNOSIS — F17200 Nicotine dependence, unspecified, uncomplicated: Secondary | ICD-10-CM | POA: Insufficient documentation

## 2012-07-15 DIAGNOSIS — G479 Sleep disorder, unspecified: Secondary | ICD-10-CM | POA: Diagnosis not present

## 2012-07-15 DIAGNOSIS — J449 Chronic obstructive pulmonary disease, unspecified: Secondary | ICD-10-CM

## 2012-07-15 DIAGNOSIS — M545 Low back pain: Secondary | ICD-10-CM | POA: Diagnosis not present

## 2012-07-15 DIAGNOSIS — M549 Dorsalgia, unspecified: Secondary | ICD-10-CM

## 2012-07-15 DIAGNOSIS — R05 Cough: Secondary | ICD-10-CM | POA: Insufficient documentation

## 2012-07-15 DIAGNOSIS — R6889 Other general symptoms and signs: Secondary | ICD-10-CM | POA: Diagnosis not present

## 2012-07-15 DIAGNOSIS — M546 Pain in thoracic spine: Secondary | ICD-10-CM | POA: Diagnosis not present

## 2012-07-15 LAB — URINALYSIS, ROUTINE W REFLEX MICROSCOPIC
Bilirubin Urine: NEGATIVE
Ketones, ur: NEGATIVE mg/dL
Leukocytes, UA: NEGATIVE
Nitrite: NEGATIVE
Protein, ur: NEGATIVE mg/dL

## 2012-07-15 MED ORDER — IPRATROPIUM BROMIDE 0.02 % IN SOLN
0.5000 mg | Freq: Once | RESPIRATORY_TRACT | Status: AC
Start: 2012-07-15 — End: 2012-07-15
  Administered 2012-07-15: 0.5 mg via RESPIRATORY_TRACT
  Filled 2012-07-15: qty 2.5

## 2012-07-15 MED ORDER — NAPROXEN 500 MG PO TABS: 500.0000 mg | ORAL_TABLET | Freq: Two times a day (BID) | ORAL | Status: DC

## 2012-07-15 MED ORDER — CYCLOBENZAPRINE HCL 10 MG PO TABS
10.0000 mg | ORAL_TABLET | Freq: Once | ORAL | Status: AC
  Administered 2012-07-15: 10 mg via ORAL
  Filled 2012-07-15: qty 1

## 2012-07-15 MED ORDER — ALBUTEROL SULFATE (5 MG/ML) 0.5% IN NEBU
5.0000 mg | INHALATION_SOLUTION | Freq: Once | RESPIRATORY_TRACT | Status: AC
  Administered 2012-07-15: 5 mg via RESPIRATORY_TRACT
  Filled 2012-07-15: qty 1

## 2012-07-15 MED ORDER — KETOROLAC TROMETHAMINE 60 MG/2ML IM SOLN
60.0000 mg | Freq: Once | INTRAMUSCULAR | Status: AC
  Administered 2012-07-15: 60 mg via INTRAMUSCULAR
  Filled 2012-07-15: qty 2

## 2012-07-15 MED ORDER — GUAIFENESIN-CODEINE 100-10 MG/5ML PO SOLN
5.0000 mL | Freq: Once | ORAL | Status: AC
  Administered 2012-07-15: 5 mL via ORAL
  Filled 2012-07-15: qty 5

## 2012-07-15 NOTE — Discharge Instructions (Signed)
Continue your home medicines. Try using the naprosyn for discomfort. Follow up with your doctor.Continue to use your nebulizer.

## 2012-07-15 NOTE — ED Notes (Signed)
Patient requesting something for her cough and for a prescription of cough medication. Dr. Colon Branch notified and stated she would place order for medication. Instructed patient to take OTC robitussin for cough as directed by dr. Colon Branch. Patient also reports pain is 10/10 when she coughs. Denies wanting any pain medication.

## 2012-07-15 NOTE — ED Notes (Signed)
Pt c/o right flank pain (hx of kidney stones) Pt also c/o sob (2 Atrovent treatments en route) 125 mg, solumedrol with relief.

## 2012-07-15 NOTE — ED Provider Notes (Signed)
History     CSN: [id number]  Arrival date & time 07/15/12  0107   First MD Initiated Contact with Patient 07/15/12 0119      Chief Complaint  Patient presents with  . Flank Pain    (Consider location/radiation/quality/duration/timing/severity/associated sxs/prior treatment) HPI   Bethany Villa is a 56 y.o. female brought in by ambulance, who presents to the Emergency Department complaining of  Shortness of breath and right back and flank pain. Patient with a h/o asthma and COPD who is on home O2. States she developed right back pain with radiation to the right flank and then shortness of breath. Pain to the back increases with breathing and coughing. She is not aware of any injury. Was given a nebulizer treatment en route that helped a bit with the shortness of breath.She states she has a h/o kidney stones however I have no history of kidney stones and no imaging supporting kidney stones.   PCP Dr. Daphine Deutscher   Past Medical History  Diagnosis Date  . Fibromyalgia   . Asthma   . DJD (degenerative joint disease)   . Chronic pain   . Depression   . Anxiety   . Panic attack   . Sleep disorder   . DJD (degenerative joint disease)     Past Surgical History  Procedure Date  . Abdominal surgery   . Back surgery   . Carpal tunnel release   . Ankle surgery   . Knee surgery   . Cesarean section   . Tonsillectomy   . Tubal ligation     Family History  Problem Relation Age of Onset  . Cancer Mother     beast    History  Substance Use Topics  . Smoking status: Current Every Day Smoker -- 1.0 packs/day for 8 years    Types: Cigarettes  . Smokeless tobacco: Never Used  . Alcohol Use: Yes     Comment: occ    OB History    Grav Para Term Preterm Abortions TAB SAB Ect Mult Living   3 2 2  1  1   2       Review of Systems  Constitutional: Negative for fever.       10 Systems reviewed and are negative for acute change except as noted in the HPI.  HENT: Negative for  congestion.   Eyes: Negative for discharge and redness.  Respiratory: Positive for cough, shortness of breath and wheezing.   Cardiovascular: Negative for chest pain.  Gastrointestinal: Negative for vomiting and abdominal pain.  Genitourinary: Positive for flank pain.  Musculoskeletal: Positive for back pain.  Skin: Negative for rash.  Neurological: Negative for syncope, numbness and headaches.  Psychiatric/Behavioral:       No behavior change.    Allergies  Penicillins; Sulfa antibiotics; and Mobic  Home Medications   Current Outpatient Rx  Name  Route  Sig  Dispense  Refill  . ALBUTEROL SULFATE (2.5 MG/3ML) 0.083% IN NEBU   Nebulization   Take 2.5 mg by nebulization every 6 (six) hours as needed. For shortness of breath         . ASPIRIN 81 MG PO CHEW   Oral   Chew 4 tablets (325 mg total) by mouth daily.   30 tablet   1   . BENZONATATE 100 MG PO CAPS   Oral   Take 100 mg by mouth 3 (three) times daily as needed. For cough         . BUDESONIDE-FORMOTEROL  FUMARATE 160-4.5 MCG/ACT IN AERO   Inhalation   Inhale 2 puffs into the lungs 2 (two) times daily.   1 Inhaler   0   . BUPROPION HCL ER (XL) 150 MG PO TB24   Oral   Take 150 mg by mouth daily.         . CYCLOBENZAPRINE HCL 10 MG PO TABS   Oral   Take 10 mg by mouth 2 (two) times daily as needed. For sleep & muscle relaxation         . DIAZEPAM 5 MG PO TABS   Oral   Take 5 mg by mouth every 8 (eight) hours as needed. For anxiety         . DICLOFENAC SODIUM 1 % TD GEL   Topical   Apply 1 application topically 4 (four) times daily as needed. To knee for pain         . DULOXETINE HCL 60 MG PO CPEP   Oral   Take 60 mg by mouth daily.           Marland Kitchen LEVALBUTEROL TARTRATE 45 MCG/ACT IN AERO   Inhalation   Inhale 1-2 puffs into the lungs every 4 (four) hours as needed. For shortness of breath         . MONTELUKAST SODIUM 10 MG PO TABS   Oral   Take 10 mg by mouth daily.          Marland Kitchen NICOTINE  10 MG IN INHA   Inhalation   Inhale 1 puff into the lungs as needed. For smoking cessation.         . OLOPATADINE HCL 0.1 % OP SOLN   Both Eyes   Place 1 drop into both eyes 2 (two) times daily.          . OXYCODONE-ACETAMINOPHEN 10-325 MG PO TABS   Oral   Take 1 tablet by mouth every 6 (six) hours as needed. For pain         . PREGABALIN 150 MG PO CAPS   Oral   Take 150 mg by mouth 2 (two) times daily.         Marland Kitchen TIOTROPIUM BROMIDE MONOHYDRATE 18 MCG IN CAPS   Inhalation   Place 1 capsule (18 mcg total) into inhaler and inhale daily.   30 capsule   2   . TRAZODONE HCL 150 MG PO TABS   Oral   Take 75 mg by mouth at bedtime.         Marland Kitchen ZOLPIDEM TARTRATE 10 MG PO TABS   Oral   Take 10 mg by mouth at bedtime as needed. For sleep           BP 148/80  Pulse 89  Temp 98.9 F (37.2 C) (Oral)  Resp 14  Ht 5\' 4"  (1.626 m)  Wt 200 lb (90.719 kg)  BMI 34.33 kg/m2  SpO2 96%  Physical Exam  Nursing note and vitals reviewed. Constitutional: She appears well-developed and well-nourished.       Awake, alert, nontoxic appearance with baseline speech for patient.  HENT:  Head: Normocephalic and atraumatic.  Mouth/Throat: No oropharyngeal exudate.  Eyes: EOM are normal. Pupils are equal, round, and reactive to light. Right eye exhibits no discharge. Left eye exhibits no discharge.  Neck: Normal range of motion. Neck supple.  Cardiovascular: Normal rate, regular rhythm and normal heart sounds.   No murmur heard. Pulmonary/Chest: Effort normal. No stridor. No respiratory distress. She has wheezes. She  has no rales. She exhibits no tenderness.  Abdominal: Soft. Bowel sounds are normal. She exhibits no mass. There is no tenderness. There is no rebound.  Genitourinary:       No cva tenderness  Musculoskeletal: She exhibits no tenderness.       Baseline ROM, moves extremities with no obvious new focal weakness. NO spinal tenderness to percussion. Tenderness to right lower  thoracic and lumbar paraspinal muscles to palpation.  Lymphadenopathy:    She has no cervical adenopathy.  Neurological:       Awake, alert, cooperative and aware of situation; motor strength bilaterally; sensation normal to light touch bilaterally; peripheral visual fields full to confrontation; no facial asymmetry; tongue midline; major cranial nerves appear intact; no pronator drift, normal finger to nose bilaterally, baseline gait without new ataxia.  Skin: No rash noted.  Psychiatric: She has a normal mood and affect.    ED Course  Procedures (including critical care time) Results for orders placed during the hospital encounter of 07/15/12  URINALYSIS, ROUTINE W REFLEX MICROSCOPIC      Component Value Range   Color, Urine YELLOW  YELLOW   APPearance CLEAR  CLEAR   Specific Gravity, Urine 1.020  1.005 - 1.030   pH 6.5  5.0 - 8.0   Glucose, UA NEGATIVE  NEGATIVE mg/dL   Hgb urine dipstick NEGATIVE  NEGATIVE   Bilirubin Urine NEGATIVE  NEGATIVE   Ketones, ur NEGATIVE  NEGATIVE mg/dL   Protein, ur NEGATIVE  NEGATIVE mg/dL   Urobilinogen, UA 0.2  0.0 - 1.0 mg/dL   Nitrite NEGATIVE  NEGATIVE   Leukocytes, UA NEGATIVE  NEGATIVE     0300 Wheezing and shortness of breath resolved with nebulizer treatment.Toradol helped back pain. Patient is asking for a meal.  MDM   Patient with back pain and shortness of breath. Nebulizer treatment improved shortness of breath and wheezing. Toradol improved back pain.Pt stable in ED with no significant deterioration in condition.The patient appears reasonably screened and/or stabilized for discharge and I doubt any other medical condition or other [facility] requiring further screening, evaluation, or treatment in the ED at this time prior to discharge.  MDM Reviewed: nursing note and vitals Interpretation: labs          Nicoletta Dress. Colon Branch, MD 07/15/12 1610

## 2012-07-21 DIAGNOSIS — Z23 Encounter for immunization: Secondary | ICD-10-CM | POA: Diagnosis not present

## 2012-08-10 DIAGNOSIS — J441 Chronic obstructive pulmonary disease with (acute) exacerbation: Secondary | ICD-10-CM | POA: Diagnosis not present

## 2012-10-20 DIAGNOSIS — F411 Generalized anxiety disorder: Secondary | ICD-10-CM | POA: Diagnosis not present

## 2012-10-20 DIAGNOSIS — J441 Chronic obstructive pulmonary disease with (acute) exacerbation: Secondary | ICD-10-CM | POA: Diagnosis not present

## 2012-10-20 DIAGNOSIS — J209 Acute bronchitis, unspecified: Secondary | ICD-10-CM | POA: Diagnosis not present

## 2012-10-30 DIAGNOSIS — M5380 Other specified dorsopathies, site unspecified: Secondary | ICD-10-CM | POA: Diagnosis not present

## 2012-10-30 DIAGNOSIS — Z79899 Other long term (current) drug therapy: Secondary | ICD-10-CM | POA: Diagnosis not present

## 2012-10-30 DIAGNOSIS — M47817 Spondylosis without myelopathy or radiculopathy, lumbosacral region: Secondary | ICD-10-CM | POA: Diagnosis not present

## 2012-10-30 DIAGNOSIS — M5137 Other intervertebral disc degeneration, lumbosacral region: Secondary | ICD-10-CM | POA: Diagnosis not present

## 2012-10-31 ENCOUNTER — Institutional Professional Consult (permissible substitution): Payer: Medicare Other | Admitting: Internal Medicine

## 2012-11-04 ENCOUNTER — Encounter: Payer: Self-pay | Admitting: Nurse Practitioner

## 2012-11-04 DIAGNOSIS — M797 Fibromyalgia: Secondary | ICD-10-CM

## 2012-11-04 DIAGNOSIS — F32A Depression, unspecified: Secondary | ICD-10-CM

## 2012-11-04 DIAGNOSIS — F411 Generalized anxiety disorder: Secondary | ICD-10-CM

## 2012-11-04 DIAGNOSIS — G4700 Insomnia, unspecified: Secondary | ICD-10-CM

## 2012-11-07 DIAGNOSIS — I10 Essential (primary) hypertension: Secondary | ICD-10-CM | POA: Diagnosis not present

## 2012-11-07 DIAGNOSIS — K219 Gastro-esophageal reflux disease without esophagitis: Secondary | ICD-10-CM | POA: Diagnosis not present

## 2012-11-07 DIAGNOSIS — E559 Vitamin D deficiency, unspecified: Secondary | ICD-10-CM | POA: Diagnosis not present

## 2012-11-07 DIAGNOSIS — R5383 Other fatigue: Secondary | ICD-10-CM | POA: Diagnosis not present

## 2012-11-07 DIAGNOSIS — Z79899 Other long term (current) drug therapy: Secondary | ICD-10-CM | POA: Diagnosis not present

## 2012-11-07 DIAGNOSIS — E039 Hypothyroidism, unspecified: Secondary | ICD-10-CM | POA: Diagnosis not present

## 2012-11-07 DIAGNOSIS — D649 Anemia, unspecified: Secondary | ICD-10-CM | POA: Diagnosis not present

## 2012-11-07 DIAGNOSIS — R5381 Other malaise: Secondary | ICD-10-CM | POA: Diagnosis not present

## 2012-11-07 DIAGNOSIS — E785 Hyperlipidemia, unspecified: Secondary | ICD-10-CM | POA: Diagnosis not present

## 2012-11-14 ENCOUNTER — Telehealth: Payer: Self-pay | Admitting: Nurse Practitioner

## 2012-11-14 NOTE — Telephone Encounter (Signed)
Pt wants lab results 

## 2012-11-14 NOTE — Telephone Encounter (Signed)
FORWARD TO MMM NURSE

## 2012-11-14 NOTE — Telephone Encounter (Signed)
All lab work normal pt aware

## 2012-11-15 NOTE — Progress Notes (Signed)
This encounter was created in error - please disregard.

## 2012-11-25 ENCOUNTER — Other Ambulatory Visit (HOSPITAL_COMMUNITY): Payer: Self-pay | Admitting: Family Medicine

## 2012-11-27 ENCOUNTER — Encounter (HOSPITAL_COMMUNITY): Payer: Self-pay

## 2012-11-27 ENCOUNTER — Telehealth: Payer: Self-pay | Admitting: Nurse Practitioner

## 2012-11-27 ENCOUNTER — Inpatient Hospital Stay (HOSPITAL_COMMUNITY)
Admission: EM | Admit: 2012-11-27 | Discharge: 2012-12-06 | DRG: 208 | Disposition: A | Discharge status: Home Health | Payer: Medicare Other | Attending: Internal Medicine | Admitting: Internal Medicine

## 2012-11-27 ENCOUNTER — Emergency Department (HOSPITAL_COMMUNITY): Payer: Medicare Other

## 2012-11-27 DIAGNOSIS — F411 Generalized anxiety disorder: Secondary | ICD-10-CM | POA: Diagnosis present

## 2012-11-27 DIAGNOSIS — J9600 Acute respiratory failure, unspecified whether with hypoxia or hypercapnia: Secondary | ICD-10-CM | POA: Diagnosis not present

## 2012-11-27 DIAGNOSIS — J189 Pneumonia, unspecified organism: Secondary | ICD-10-CM

## 2012-11-27 DIAGNOSIS — J45902 Unspecified asthma with status asthmaticus: Secondary | ICD-10-CM

## 2012-11-27 DIAGNOSIS — J441 Chronic obstructive pulmonary disease with (acute) exacerbation: Secondary | ICD-10-CM | POA: Diagnosis present

## 2012-11-27 DIAGNOSIS — J9602 Acute respiratory failure with hypercapnia: Secondary | ICD-10-CM

## 2012-11-27 DIAGNOSIS — Z880 Allergy status to penicillin: Secondary | ICD-10-CM | POA: Diagnosis not present

## 2012-11-27 DIAGNOSIS — R05 Cough: Secondary | ICD-10-CM | POA: Diagnosis not present

## 2012-11-27 DIAGNOSIS — J9622 Acute and chronic respiratory failure with hypercapnia: Secondary | ICD-10-CM | POA: Diagnosis present

## 2012-11-27 DIAGNOSIS — G9341 Metabolic encephalopathy: Secondary | ICD-10-CM | POA: Diagnosis present

## 2012-11-27 DIAGNOSIS — E876 Hypokalemia: Secondary | ICD-10-CM | POA: Diagnosis not present

## 2012-11-27 DIAGNOSIS — F17200 Nicotine dependence, unspecified, uncomplicated: Secondary | ICD-10-CM

## 2012-11-27 DIAGNOSIS — IMO0001 Reserved for inherently not codable concepts without codable children: Secondary | ICD-10-CM | POA: Diagnosis present

## 2012-11-27 DIAGNOSIS — Z9911 Dependence on respirator [ventilator] status: Secondary | ICD-10-CM | POA: Diagnosis not present

## 2012-11-27 DIAGNOSIS — F3289 Other specified depressive episodes: Secondary | ICD-10-CM | POA: Diagnosis present

## 2012-11-27 DIAGNOSIS — Z9981 Dependence on supplemental oxygen: Secondary | ICD-10-CM

## 2012-11-27 DIAGNOSIS — G894 Chronic pain syndrome: Secondary | ICD-10-CM | POA: Diagnosis not present

## 2012-11-27 DIAGNOSIS — Z6841 Body Mass Index (BMI) 40.0 and over, adult: Secondary | ICD-10-CM | POA: Diagnosis not present

## 2012-11-27 DIAGNOSIS — R0602 Shortness of breath: Secondary | ICD-10-CM | POA: Diagnosis not present

## 2012-11-27 DIAGNOSIS — J449 Chronic obstructive pulmonary disease, unspecified: Secondary | ICD-10-CM | POA: Diagnosis not present

## 2012-11-27 DIAGNOSIS — J9819 Other pulmonary collapse: Secondary | ICD-10-CM | POA: Diagnosis not present

## 2012-11-27 DIAGNOSIS — J9620 Acute and chronic respiratory failure, unspecified whether with hypoxia or hypercapnia: Secondary | ICD-10-CM | POA: Diagnosis not present

## 2012-11-27 DIAGNOSIS — M1990 Unspecified osteoarthritis, unspecified site: Secondary | ICD-10-CM | POA: Diagnosis present

## 2012-11-27 DIAGNOSIS — J155 Pneumonia due to Escherichia coli: Secondary | ICD-10-CM | POA: Diagnosis present

## 2012-11-27 DIAGNOSIS — G479 Sleep disorder, unspecified: Secondary | ICD-10-CM | POA: Diagnosis present

## 2012-11-27 DIAGNOSIS — R059 Cough, unspecified: Secondary | ICD-10-CM | POA: Diagnosis not present

## 2012-11-27 DIAGNOSIS — Z79899 Other long term (current) drug therapy: Secondary | ICD-10-CM | POA: Diagnosis not present

## 2012-11-27 DIAGNOSIS — F410 Panic disorder [episodic paroxysmal anxiety] without agoraphobia: Secondary | ICD-10-CM | POA: Diagnosis present

## 2012-11-27 DIAGNOSIS — J45901 Unspecified asthma with (acute) exacerbation: Secondary | ICD-10-CM | POA: Diagnosis present

## 2012-11-27 DIAGNOSIS — F329 Major depressive disorder, single episode, unspecified: Secondary | ICD-10-CM | POA: Diagnosis not present

## 2012-11-27 DIAGNOSIS — Z4682 Encounter for fitting and adjustment of non-vascular catheter: Secondary | ICD-10-CM | POA: Diagnosis not present

## 2012-11-27 LAB — CBC WITH DIFFERENTIAL/PLATELET
Eosinophils Absolute: 0.2 10*3/uL (ref 0.0–0.7)
Eosinophils Relative: 2 % (ref 0–5)
HCT: 46.7 % — ABNORMAL HIGH (ref 36.0–46.0)
Lymphocytes Relative: 12 % (ref 12–46)
Lymphs Abs: 1.3 10*3/uL (ref 0.7–4.0)
MCH: 28.8 pg (ref 26.0–34.0)
MCV: 94.7 fL (ref 78.0–100.0)
Monocytes Absolute: 0.7 10*3/uL (ref 0.1–1.0)
Monocytes Relative: 6 % (ref 3–12)
Platelets: 202 10*3/uL (ref 150–400)
RBC: 4.93 MIL/uL (ref 3.87–5.11)
WBC: 11.2 10*3/uL — ABNORMAL HIGH (ref 4.0–10.5)

## 2012-11-27 LAB — BASIC METABOLIC PANEL
CO2: 38 mEq/L — ABNORMAL HIGH (ref 19–32)
Calcium: 8.9 mg/dL (ref 8.4–10.5)
Chloride: 101 mEq/L (ref 96–112)
Creatinine, Ser: 0.5 mg/dL (ref 0.50–1.10)
Glucose, Bld: 135 mg/dL — ABNORMAL HIGH (ref 70–99)
Sodium: 145 mEq/L (ref 135–145)

## 2012-11-27 LAB — TROPONIN I: Troponin I: <0.3 ng/mL (ref <0.30)

## 2012-11-27 MED ORDER — LEVALBUTEROL HCL 0.63 MG/3ML IN NEBU
0.6300 mg | INHALATION_SOLUTION | RESPIRATORY_TRACT | Status: DC | PRN
  Administered 2012-11-30: 0.63 mg via RESPIRATORY_TRACT
  Filled 2012-11-27 (×2): qty 3

## 2012-11-27 MED ORDER — OXYCODONE-ACETAMINOPHEN 5-325 MG PO TABS
1.0000 | ORAL_TABLET | Freq: Four times a day (QID) | ORAL | Status: DC | PRN
  Administered 2012-11-28 (×2): 1 via ORAL
  Filled 2012-11-27 (×2): qty 1

## 2012-11-27 MED ORDER — SODIUM CHLORIDE 0.9 % IV SOLN
INTRAVENOUS | Status: DC
  Administered 2012-11-27: 16:00:00 via INTRAVENOUS

## 2012-11-27 MED ORDER — ONDANSETRON HCL 4 MG PO TABS: 4.0000 mg | ORAL_TABLET | Freq: Four times a day (QID) | ORAL | Status: DC | PRN

## 2012-11-27 MED ORDER — BUPROPION HCL ER (XL) 150 MG PO TB24
150.0000 mg | ORAL_TABLET | Freq: Every day | ORAL | Status: DC
  Administered 2012-11-28: 150 mg via ORAL
  Filled 2012-11-27 (×5): qty 1

## 2012-11-27 MED ORDER — FENTANYL CITRATE 0.05 MG/ML IJ SOLN
50.0000 ug | INTRAMUSCULAR | Status: DC | PRN
  Administered 2012-11-27: 50 ug via INTRAVENOUS
  Filled 2012-11-27: qty 2

## 2012-11-27 MED ORDER — IPRATROPIUM BROMIDE 0.02 % IN SOLN
0.5000 mg | RESPIRATORY_TRACT | Status: DC
  Administered 2012-11-27 – 2012-12-06 (×47): 0.5 mg via RESPIRATORY_TRACT
  Filled 2012-11-27 (×49): qty 2.5

## 2012-11-27 MED ORDER — ALBUTEROL SULFATE (5 MG/ML) 0.5% IN NEBU
INHALATION_SOLUTION | RESPIRATORY_TRACT | Status: AC
  Administered 2012-11-27: 15 mg via RESPIRATORY_TRACT
  Filled 2012-11-27: qty 3

## 2012-11-27 MED ORDER — ONDANSETRON HCL 4 MG/2ML IJ SOLN
4.0000 mg | Freq: Four times a day (QID) | INTRAMUSCULAR | Status: DC | PRN
  Administered 2012-11-28: 4 mg via INTRAVENOUS
  Filled 2012-11-27: qty 2

## 2012-11-27 MED ORDER — SODIUM CHLORIDE 0.9 % IV SOLN: INTRAVENOUS | Status: DC

## 2012-11-27 MED ORDER — SODIUM CHLORIDE 0.9 % IJ SOLN
3.0000 mL | Freq: Two times a day (BID) | INTRAMUSCULAR | Status: DC
  Administered 2012-11-27 – 2012-12-05 (×13): 3 mL via INTRAVENOUS
  Filled 2012-11-27: qty 3

## 2012-11-27 MED ORDER — IPRATROPIUM BROMIDE 0.02 % IN SOLN
1.0000 mg | Freq: Once | RESPIRATORY_TRACT | Status: AC
  Administered 2012-11-27: 1 mg via RESPIRATORY_TRACT
  Filled 2012-11-27: qty 5

## 2012-11-27 MED ORDER — PREGABALIN 75 MG PO CAPS
150.0000 mg | ORAL_CAPSULE | Freq: Two times a day (BID) | ORAL | Status: DC
  Administered 2012-11-28: 150 mg via ORAL
  Filled 2012-11-27 (×2): qty 2

## 2012-11-27 MED ORDER — MAGNESIUM SULFATE 40 MG/ML IJ SOLN
2.0000 g | Freq: Once | INTRAMUSCULAR | Status: AC
  Administered 2012-11-27: 2 g via INTRAVENOUS
  Filled 2012-11-27: qty 50

## 2012-11-27 MED ORDER — ENOXAPARIN SODIUM 40 MG/0.4ML ~~LOC~~ SOLN
40.0000 mg | SUBCUTANEOUS | Status: DC
  Administered 2012-11-27 – 2012-12-05 (×9): 40 mg via SUBCUTANEOUS
  Filled 2012-11-27 (×9): qty 0.4

## 2012-11-27 MED ORDER — MONTELUKAST SODIUM 10 MG PO TABS
10.0000 mg | ORAL_TABLET | Freq: Every day | ORAL | Status: DC
  Administered 2012-11-27 – 2012-11-29 (×3): 10 mg via ORAL
  Filled 2012-11-27 (×3): qty 1

## 2012-11-27 MED ORDER — OLOPATADINE HCL 0.1 % OP SOLN
1.0000 [drp] | Freq: Two times a day (BID) | OPHTHALMIC | Status: DC
  Administered 2012-11-28 – 2012-12-06 (×15): 1 [drp] via OPHTHALMIC
  Filled 2012-11-27 (×2): qty 5

## 2012-11-27 MED ORDER — TRAZODONE HCL 50 MG PO TABS
75.0000 mg | ORAL_TABLET | Freq: Every day | ORAL | Status: DC
  Administered 2012-11-27: 75 mg via ORAL
  Filled 2012-11-27: qty 2

## 2012-11-27 MED ORDER — ALBUTEROL SULFATE (5 MG/ML) 0.5% IN NEBU: 2.5000 mg | INHALATION_SOLUTION | RESPIRATORY_TRACT | Status: DC

## 2012-11-27 MED ORDER — LEVALBUTEROL HCL 0.63 MG/3ML IN NEBU
0.6300 mg | INHALATION_SOLUTION | Freq: Once | RESPIRATORY_TRACT | Status: AC
  Administered 2012-11-27: 0.63 mg via RESPIRATORY_TRACT
  Filled 2012-11-27: qty 3

## 2012-11-27 MED ORDER — ACETAMINOPHEN 650 MG RE SUPP
650.0000 mg | Freq: Four times a day (QID) | RECTAL | Status: DC | PRN
  Administered 2012-11-29: 650 mg via RECTAL
  Filled 2012-11-27: qty 1

## 2012-11-27 MED ORDER — CYCLOBENZAPRINE HCL 10 MG PO TABS: 10.0000 mg | ORAL_TABLET | Freq: Two times a day (BID) | ORAL | Status: DC | PRN

## 2012-11-27 MED ORDER — DIAZEPAM 5 MG PO TABS
5.0000 mg | ORAL_TABLET | Freq: Three times a day (TID) | ORAL | Status: DC | PRN
  Administered 2012-11-27: 5 mg via ORAL
  Filled 2012-11-27: qty 1

## 2012-11-27 MED ORDER — OXYCODONE-ACETAMINOPHEN 10-325 MG PO TABS: 1.0000 | ORAL_TABLET | Freq: Four times a day (QID) | ORAL | Status: DC | PRN

## 2012-11-27 MED ORDER — BENZONATATE 100 MG PO CAPS
200.0000 mg | ORAL_CAPSULE | Freq: Three times a day (TID) | ORAL | Status: DC
  Administered 2012-11-27 – 2012-11-28 (×2): 200 mg via ORAL
  Filled 2012-11-27 (×2): qty 2

## 2012-11-27 MED ORDER — METHYLPREDNISOLONE SODIUM SUCC 125 MG IJ SOLR
125.0000 mg | Freq: Once | INTRAMUSCULAR | Status: AC
  Administered 2012-11-27: 125 mg via INTRAVENOUS
  Filled 2012-11-27: qty 2

## 2012-11-27 MED ORDER — LEVALBUTEROL HCL 0.63 MG/3ML IN NEBU
0.6300 mg | INHALATION_SOLUTION | RESPIRATORY_TRACT | Status: DC
  Administered 2012-11-27 – 2012-12-06 (×47): 0.63 mg via RESPIRATORY_TRACT
  Filled 2012-11-27 (×48): qty 3

## 2012-11-27 MED ORDER — BUDESONIDE-FORMOTEROL FUMARATE 160-4.5 MCG/ACT IN AERO
2.0000 | INHALATION_SPRAY | Freq: Two times a day (BID) | RESPIRATORY_TRACT | Status: DC
  Administered 2012-11-28 – 2012-12-06 (×11): 2 via RESPIRATORY_TRACT
  Filled 2012-11-27 (×2): qty 6

## 2012-11-27 MED ORDER — LEVOFLOXACIN IN D5W 500 MG/100ML IV SOLN
INTRAVENOUS | Status: AC
  Filled 2012-11-27: qty 100

## 2012-11-27 MED ORDER — METHYLPREDNISOLONE SODIUM SUCC 125 MG IJ SOLR
60.0000 mg | Freq: Four times a day (QID) | INTRAMUSCULAR | Status: DC
  Administered 2012-11-27 – 2012-12-02 (×19): 60 mg via INTRAVENOUS
  Filled 2012-11-27 (×19): qty 2

## 2012-11-27 MED ORDER — POTASSIUM CHLORIDE IN NACL 20-0.9 MEQ/L-% IV SOLN
INTRAVENOUS | Status: DC
  Administered 2012-11-27 – 2012-11-28 (×2): via INTRAVENOUS

## 2012-11-27 MED ORDER — NICOTINE 21 MG/24HR TD PT24
21.0000 mg | MEDICATED_PATCH | Freq: Every day | TRANSDERMAL | Status: DC
  Administered 2012-11-27 – 2012-12-06 (×9): 21 mg via TRANSDERMAL
  Filled 2012-11-27 (×10): qty 1

## 2012-11-27 MED ORDER — ALBUTEROL SULFATE (5 MG/ML) 0.5% IN NEBU
15.0000 mg | INHALATION_SOLUTION | Freq: Once | RESPIRATORY_TRACT | Status: AC
  Administered 2012-11-27: 15 mg via RESPIRATORY_TRACT

## 2012-11-27 MED ORDER — OXYCODONE HCL 5 MG PO TABS
5.0000 mg | ORAL_TABLET | Freq: Four times a day (QID) | ORAL | Status: DC | PRN
  Administered 2012-11-27 – 2012-11-28 (×2): 5 mg via ORAL
  Filled 2012-11-27 (×2): qty 1

## 2012-11-27 MED ORDER — DULOXETINE HCL 60 MG PO CPEP
60.0000 mg | ORAL_CAPSULE | Freq: Every day | ORAL | Status: DC
  Administered 2012-11-28 – 2012-11-29 (×2): 60 mg via ORAL
  Filled 2012-11-27 (×3): qty 1

## 2012-11-27 MED ORDER — ACETAMINOPHEN 325 MG PO TABS
650.0000 mg | ORAL_TABLET | Freq: Four times a day (QID) | ORAL | Status: DC | PRN
  Administered 2012-11-30 – 2012-12-06 (×22): 650 mg via ORAL
  Filled 2012-11-27 (×21): qty 2

## 2012-11-27 MED ORDER — LEVOFLOXACIN IN D5W 500 MG/100ML IV SOLN
500.0000 mg | INTRAVENOUS | Status: DC
  Administered 2012-11-27 – 2012-11-28 (×2): 500 mg via INTRAVENOUS
  Filled 2012-11-27 (×2): qty 100

## 2012-11-27 MED ORDER — GUAIFENESIN ER 600 MG PO TB12
600.0000 mg | ORAL_TABLET | Freq: Two times a day (BID) | ORAL | Status: DC
  Administered 2012-11-27 – 2012-11-29 (×3): 600 mg via ORAL
  Filled 2012-11-27 (×3): qty 1

## 2012-11-27 NOTE — ED Provider Notes (Signed)
History     CSN: [id number]  Arrival date & time 11/27/12  1509   First MD Initiated Contact with Patient 11/27/12 1522      Chief Complaint  Patient presents with  . Respiratory Distress     HPI Pt was seen at 1520.   Per pt, c/o gradual onset and worsening of persistent cough, wheezing and SOB for the past 3 weeks, worse over the past 2 to 3 days.  Describes her symptoms as "my asthma is acting up."  Describes the cough as productive of "yellow" sputum.  Has been using home MDI and nebs without relief. Has been associated with sinus congestion and sneezing for the past 1 month. EMS gave pt 3 nebs en route without relief.  Denies CP/palpitations, no abd pain, no N/V/D, no fevers, no rash. Pt also c/o gradual onset and persistence of constant acute flair of her chronic low back "pain" for the past several days.  Denies any change in her usual chronic pain pattern. Pt states her LBP worsened after she did not take her usual dose of pain meds today and is requesting "some oxycodone." Pain worsens with palpation of the area and body position changes. Denies incont/retention of bowel or bladder, no saddle anesthesia, no focal motor weakness, no tingling/numbness in extremities, no fevers, no injury, no abd pain.       Past Medical History  Diagnosis Date  . Fibromyalgia   . Asthma   . DJD (degenerative joint disease)   . Chronic pain   . Depression   . Anxiety   . Panic attack   . Sleep disorder   . DJD (degenerative joint disease)     Past Surgical History  Procedure Laterality Date  . Abdominal surgery    . Back surgery    . Carpal tunnel release    . Ankle surgery    . Knee surgery    . Cesarean section    . Tonsillectomy    . Tubal ligation      Family History  Problem Relation Age of Onset  . Cancer Mother     beast    History  Substance Use Topics  . Smoking status: Current Every Day Smoker -- 1.00 packs/day for 8 years    Types: Cigarettes  . Smokeless  tobacco: Never Used  . Alcohol Use: Yes     Comment: occ    OB History   Grav Para Term Preterm Abortions TAB SAB Ect Mult Living   3 2 2  1  1   2       Review of Systems ROS: Statement: All systems negative except as marked or noted in the HPI; Constitutional: Negative for fever and chills. ; ; Eyes: Negative for eye pain, redness and discharge. ; ; ENMT: Negative for ear pain, hoarseness, nasal congestion, sore throat. +sinus pressure, sneezing. ; ; Cardiovascular: Negative for chest pain, palpitations, diaphoresis, and peripheral edema. ; ; Respiratory: +SOB, wheezing, cough. Negative for stridor. ; ; Gastrointestinal: Negative for nausea, vomiting, diarrhea, abdominal pain, blood in stool, hematemesis, jaundice and rectal bleeding. . ; ; Genitourinary: Negative for dysuria, flank pain and hematuria. ; ; Musculoskeletal: +LBP. Negative for neck pain. Negative for swelling and trauma.; ; Skin: Negative for pruritus, rash, abrasions, blisters, bruising and skin lesion.; ; Neuro: Negative for headache, lightheadedness and neck stiffness. Negative for weakness, altered level of consciousness , altered mental status, extremity weakness, paresthesias, involuntary movement, seizure and syncope.  Allergies  Penicillins; Sulfa antibiotics; and Mobic  Home Medications   Current Outpatient Rx  Name  Route  Sig  Dispense  Refill  . albuterol (PROVENTIL) (2.5 MG/3ML) 0.083% nebulizer solution   Nebulization   Take 2.5 mg by nebulization every 6 (six) hours as needed. For shortness of breath         . benzonatate (TESSALON) 100 MG capsule   Oral   Take 100 mg by mouth 3 (three) times daily as needed. For cough         . EXPIRED: budesonide-formoterol (SYMBICORT) 160-4.5 MCG/ACT inhaler   Inhalation   Inhale 2 puffs into the lungs 2 (two) times daily.   1 Inhaler   0   . buPROPion (WELLBUTRIN XL) 150 MG 24 hr tablet   Oral   Take 150 mg by mouth daily.         .  cyclobenzaprine (FLEXERIL) 10 MG tablet   Oral   Take 10 mg by mouth 2 (two) times daily as needed. For sleep & muscle relaxation         . diazepam (VALIUM) 5 MG tablet   Oral   Take 5 mg by mouth every 8 (eight) hours as needed. For anxiety         . diclofenac sodium (VOLTAREN) 1 % GEL   Topical   Apply 1 application topically 4 (four) times daily as needed. To knee for pain         . DULoxetine (CYMBALTA) 60 MG capsule   Oral   Take 60 mg by mouth daily.           Marland Kitchen levalbuterol (XOPENEX HFA) 45 MCG/ACT inhaler   Inhalation   Inhale 1-2 puffs into the lungs every 4 (four) hours as needed. For shortness of breath         . montelukast (SINGULAIR) 10 MG tablet   Oral   Take 10 mg by mouth daily.          . naproxen (NAPROSYN) 500 MG tablet   Oral   Take 1 tablet (500 mg total) by mouth 2 (two) times daily.   30 tablet   0   . nicotine (NICOTROL) 10 MG inhaler   Inhalation   Inhale 1 puff into the lungs as needed. For smoking cessation.         Marland Kitchen olopatadine (PATANOL) 0.1 % ophthalmic solution   Both Eyes   Place 1 drop into both eyes 2 (two) times daily.          Marland Kitchen oxyCODONE-acetaminophen (PERCOCET) 10-325 MG per tablet   Oral   Take 1 tablet by mouth every 6 (six) hours as needed. For pain         . pregabalin (LYRICA) 150 MG capsule   Oral   Take 150 mg by mouth 2 (two) times daily.         Marland Kitchen SPIRIVA HANDIHALER 18 MCG inhalation capsule      USE 1 CAPSULE IN HANDIHALER AND INHALE EVERY MORNING   30 capsule   5   . traZODone (DESYREL) 150 MG tablet   Oral   Take 75 mg by mouth at bedtime.         Marland Kitchen zolpidem (AMBIEN) 10 MG tablet   Oral   Take 10 mg by mouth at bedtime as needed. For sleep           BP 144/65  Pulse 104  Temp(Src) 98.9 F (37.2 C) (Oral)  Resp 21  SpO2 98%  Physical Exam 1525: Physical examination:  Nursing notes reviewed; Vital signs and O2 SAT reviewed;  Constitutional: Well developed, Well nourished,  Well hydrated, Uncomfortable appearing; Head:  Normocephalic, atraumatic; Eyes: EOMI, PERRL, No scleral icterus; ENMT: TM's clear bilat. +edemetous nasal turbinates bilat with clear rhinorrhea. Mouth and pharynx normal, Mucous membranes moist; Neck: Supple, Full range of motion, No lymphadenopathy; Cardiovascular: Tachycardic rate and rhythm, No murmur, rub, or gallop; Respiratory: Breath sounds diminished & equal bilaterally, insp/exp wheezes bilat with audible wheezing. Speaking in phrases. +tachypneic, +access mm use.; Chest: Nontender, Movement normal; Abdomen: Soft, Nontender, Nondistended, Normal bowel sounds; Genitourinary: No CVA tenderness; Extremities: Pulses normal, No tenderness, No edema, No calf edema or asymmetry.; Neuro: AA&Ox3, Major CN grossly intact.  Speech clear. No gross focal motor or sensory deficits in extremities.; Skin: Color normal, Warm, Dry.   ED Course  Procedures   1530:  Pt with resp distress, with exam as above, on arrival to ED: will start hour long neb and dose IV steroid.  1630:  Hour long neg completed.  Pt continues to c/o SOB, sitting upright, lungs with insp/exp wheezing bilat, +audible wheezing.  Sats remain 96-99% on O2 3L N/C. VSS, afebrile. Will dose xopenex neb, IV magesium and admit.  Dx and testing d/w pt.  Questions answered.  Verb understanding, agreeable to admit.  1650:  T/C to [facility] Dr. Kerry Hough, case discussed, including:  HPI, pertinent PM/SHx, VS/PE, dx testing, ED course and treatment:  Agreeable to observation admit, requests to write temporary orders, obtain tele bed to team 2.   MDM  MDM Reviewed: previous chart, nursing note and vitals Reviewed previous: ECG and labs Interpretation: ECG, labs and x-ray Total time providing critical care: 30-74 minutes. This excludes time spent performing separately reportable procedures and services. Consults: admitting MD   CRITICAL CARE Performed by: Laray Anger Total critical care time:  40 Critical care time was exclusive of separately billable procedures and treating other patients. Critical care was necessary to treat or prevent imminent or life-threatening deterioration. Critical care was time spent personally by me on the following activities: development of treatment plan with patient and/or surrogate as well as nursing, discussions with consultants, evaluation of patient's response to treatment, examination of patient, obtaining history from patient or surrogate, ordering and performing treatments and interventions, ordering and review of laboratory studies, ordering and review of radiographic studies, pulse oximetry and re-evaluation of patient's condition.    Date: 11/27/2012  Rate: 94  Rhythm: normal sinus rhythm  QRS Axis: normal  Intervals: normal  ST/T Wave abnormalities: normal  Conduction Disutrbances:none  Narrative Interpretation:   Old EKG Reviewed: unchanged; no significant changes from previous EKG dated 11/15/2011.  Results for orders placed during the hospital encounter of 11/27/12  BASIC METABOLIC PANEL      Result Value Range   Sodium 145  135 - 145 mEq/L   Potassium 3.7  3.5 - 5.1 mEq/L   Chloride 101  96 - 112 mEq/L   CO2 38 (*) 19 - 32 mEq/L   Glucose, Bld 135 (*) 70 - 99 mg/dL   BUN 5 (*) 6 - 23 mg/dL   Creatinine, Ser 1.61  0.50 - 1.10 mg/dL   Calcium 8.9  8.4 - 09.6 mg/dL   GFR calc non Af Amer >90  >90 mL/min   GFR calc Af Amer >90  >90 mL/min  CBC WITH DIFFERENTIAL      Result Value Range   WBC 11.2 (*)  4.0 - 10.5 K/uL   RBC 4.93  3.87 - 5.11 MIL/uL   Hemoglobin 14.2  12.0 - 15.0 g/dL   HCT 45.4 (*) 09.8 - 11.9 %   MCV 94.7  78.0 - 100.0 fL   MCH 28.8  26.0 - 34.0 pg   MCHC 30.4  30.0 - 36.0 g/dL   RDW 14.7  82.9 - 56.2 %   Platelets 202  150 - 400 K/uL   Neutrophils Relative 81 (*) 43 - 77 %   Neutro Abs 9.0 (*) 1.7 - 7.7 K/uL   Lymphocytes Relative 12  12 - 46 %   Lymphs Abs 1.3  0.7 - 4.0 K/uL   Monocytes Relative 6  3 -  12 %   Monocytes Absolute 0.7  0.1 - 1.0 K/uL   Eosinophils Relative 2  0 - 5 %   Eosinophils Absolute 0.2  0.0 - 0.7 K/uL   Basophils Relative 0  0 - 1 %   Basophils Absolute 0.0  0.0 - 0.1 K/uL  TROPONIN I      Result Value Range   Troponin I <0.30  <0.30 ng/mL  PRO B NATRIURETIC PEPTIDE      Result Value Range   Pro B Natriuretic peptide (BNP) 228.4 (*) 0 - 125 pg/mL   Dg Chest Port 1 View 11/27/2012  *RADIOLOGY REPORT*  Clinical Data: Cough.  PORTABLE CHEST - 1 VIEW  Comparison: 11/15/2011  Findings: 1545 hours.  Low volume film. The cardiopericardial silhouette is enlarged.  No focal airspace consolidation or pulmonary edema.  No pleural effusion. Imaged bony structures of the thorax are intact. Telemetry leads overlie the chest.  IMPRESSION: Low volume film without acute cardiopulmonary findings.   Original Report Authenticated By: Kennith Center, M.D.                Laray Anger, DO 11/29/12 [phone]

## 2012-11-27 NOTE — Telephone Encounter (Signed)
Her cough med. Front to pull chart and bring it to you

## 2012-11-27 NOTE — ED Notes (Signed)
Notified EDP's of pt.

## 2012-11-27 NOTE — Telephone Encounter (Signed)
Please Advise

## 2012-11-27 NOTE — Telephone Encounter (Signed)
YES

## 2012-11-27 NOTE — H&P (Signed)
[facility] History and Physical  Bethany Villa WUJ:[id number] DOB: [birth year] DOA: 11/27/2012  Referring physician: Dr. Clarene Duke PCP: Bennie Pierini, FNP  Specialists: Dr. Sherene Sires, pulmonology  Chief Complaint: shortness of breath  HPI: Bethany Villa is a 57 y.o. female with known COPD and chronic respiratory failure on home oxygen. She continues to smoke. She reports that she was short of breath for the past few weeks, but worse over the past 3 days. 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 Since that time, she has had increased coughing, runny nose, shortness of breath and wheezing. She is not sure whether she is having fevers. She has no chest pain. No vomiting diarrhea or dysuria. She was evaluated in the emergency room and was found to have a COPD exacerbation. She's been referred for admission.  Review of Systems: Pertinent positives expiration, otherwise negative  Past Medical History  Diagnosis Date  . Fibromyalgia   . Asthma   . DJD (degenerative joint disease)   . Chronic pain   . Depression   . Anxiety   . Panic attack   . Sleep disorder   . DJD (degenerative joint disease)    Past Surgical History  Procedure Laterality Date  . Abdominal surgery    . Back surgery    . Carpal tunnel release    . Ankle surgery    . Knee surgery    . Cesarean section    . Tonsillectomy    . Tubal ligation     Social History:  reports that she has been smoking Cigarettes.  She has a 8 pack-year smoking history. She has never used smokeless tobacco. She reports that  drinks alcohol. She reports that she does not use illicit drugs.   Allergies  Allergen Reactions  . Penicillins Shortness Of Breath and Swelling  . Sulfa Antibiotics Shortness Of Breath and Swelling  . Mobic (Meloxicam)     Family History  Problem Relation Age of Onset  . Cancer Mother     beast     Prior to  Admission medications   Medication Sig Start Date End Date Taking? Authorizing Provider  acetaminophen (TYLENOL) 325 MG tablet Take 325 mg by mouth 4 (four) times daily as needed for pain (Taken with Oxycodone 10 mg as needed for pain).   Yes Historical Provider, MD  albuterol (PROAIR HFA) 108 (90 BASE) MCG/ACT inhaler Inhale 2 puffs into the lungs every 6 (six) hours as needed for wheezing or shortness of breath.   Yes Historical Provider, MD  albuterol (PROVENTIL) (2.5 MG/3ML) 0.083% nebulizer solution Take 2.5 mg by nebulization every 6 (six) hours as needed. For shortness of breath   Yes Historical Provider, MD  budesonide-formoterol (SYMBICORT) 160-4.5 MCG/ACT inhaler Inhale 2 puffs into the lungs 2 (two) times daily. 10/08/11 11/27/12 Yes Nimish Normajean Glasgow, MD  buPROPion (WELLBUTRIN XL) 150 MG 24 hr tablet Take 150 mg by mouth every morning.    Yes Historical Provider, MD  diazepam (VALIUM) 5 MG tablet Take 5 mg by mouth every 8 (eight) hours as needed. For anxiety   Yes Historical Provider, MD  diclofenac (VOLTAREN) 75 MG EC tablet Take 75 mg by mouth 2 (two) times daily. 10/30/12  Yes Historical Provider, MD  diclofenac sodium (VOLTAREN) 1 % GEL Apply 1 application topically 4 (four) times daily as needed. To knee for pain   Yes Historical Provider, MD  DULoxetine (CYMBALTA) 60 MG capsule Take 60 mg by mouth daily.     Yes Historical Provider, MD  fluticasone (FLONASE) 50 MCG/ACT nasal spray Place 2 sprays into the nose daily. 10/28/12  Yes Historical Provider, MD  levalbuterol (XOPENEX HFA) 45 MCG/ACT inhaler Inhale 1-2 puffs into the lungs every 4 (four) hours as needed. For shortness of breath   Yes Historical Provider, MD  montelukast (SINGULAIR) 10 MG tablet Take 10 mg by mouth daily.    Yes Historical Provider, MD  olopatadine (PATANOL) 0.1 % ophthalmic solution Place 1 drop into both eyes 2 (two) times daily.    Yes Historical Provider, MD  Oxycodone HCl 10 MG TABS Take 10 mg by mouth 4 (four)  times daily as needed (for pain).   Yes Historical Provider, MD  tiotropium (SPIRIVA) 18 MCG inhalation capsule Place 18 mcg into inhaler and inhale daily.   Yes Historical Provider, MD  zolpidem (AMBIEN) 10 MG tablet Take 10 mg by mouth at bedtime as needed. For sleep   Yes Historical Provider, MD   Physical Exam: Filed Vitals:   11/27/12 1522 11/27/12 1538 11/27/12 1600 11/27/12 1653  BP: 144/65  149/69   Pulse: 104  103   Temp: 98.9 F (37.2 C)     TempSrc: Oral     Resp: 21  23   SpO2: 96% 98% 100% 94%     General: Increased respiratory effort, awake, alert,  Eyes: Pupils are equal round react to light  ENT: Mucous membranes are moist  Neck: Supple  Cardiovascular: S1, S2, regular rate and rhythm  Respiratory: Bilateral expiratory wheezes  Abdomen: Soft, nontender, nondistended, bowel sounds are active  Skin: No rashes  Musculoskeletal: No edema, cyanosis bilaterally  Psychiatric: Normal affect, cooperative with exam  Neurologic: Grossly intact, nonfocal  Labs on Admission:  Basic Metabolic Panel:  Recent Labs Lab 11/27/12 1534  NA 145  K 3.7  CL 101  CO2 38*  GLUCOSE 135*  BUN 5*  CREATININE 0.50  CALCIUM 8.9   Liver Function Tests: No results found for this basename: AST, ALT, ALKPHOS, BILITOT, PROT, ALBUMIN,  in the last 168 hours No results found for this basename: LIPASE, AMYLASE,  in the last 168 hours No results found for this basename: AMMONIA,  in the last 168 hours CBC:  Recent Labs Lab 11/27/12 1534  WBC 11.2*  NEUTROABS 9.0*  HGB 14.2  HCT 46.7*  MCV 94.7  PLT 202   Cardiac Enzymes:  Recent Labs Lab 11/27/12 1534  TROPONINI <0.30    BNP (last 3 results)  Recent Labs  11/27/12 1534  PROBNP 228.4*   CBG: No results found for this basename: GLUCAP,  in the last 168 hours  Radiological Exams on Admission: Dg Chest Port 1 View  11/27/2012  *RADIOLOGY REPORT*  Clinical Data: Cough.  PORTABLE CHEST - 1 VIEW   Comparison: 11/15/2011  Findings: 1545 hours.  Low volume film. The cardiopericardial silhouette is enlarged.  No focal airspace consolidation or pulmonary edema.  No pleural effusion. Imaged bony structures of the thorax are intact. Telemetry leads overlie the chest.  IMPRESSION: Low volume film without acute cardiopulmonary findings.   Original Report Authenticated By: Kennith Center, M.D.     EKG: Independently reviewed. Sinus tachycardia without acute changes  Assessment/Plan Principal Problem:   COPD (chronic obstructive pulmonary disease) Active Problems:   Smoking   Chronic pain disorder   Acute-on-chronic respiratory failure   1. Acute on chronic respiratory failure due to COPD  exacerbation. Patient will be given IV steroids, scheduled and when necessary nebulizer treatments, mucolytics and pulmonary hygiene. We will also give her Levaquin. Start to wean down oxygen as tolerated. 2. Tobacco use. Patient was strongly advised to quit smoking. We will give her a nicotine patch while she's in the hospital. 3. Chronic pain disorder. Continue chronic medications. 4.  Depression. Continue chronic medications.    Code Status: full code Family Communication: discussed with patient Disposition Plan: discharge home once improved  Time spent:  [name],[name] [facility] Pager [phone]  If 7PM-7AM, please contact night-coverage [email or web address] Password [email or web address] 11/27/2012, 5:50 PM

## 2012-11-27 NOTE — ED Notes (Signed)
Pt c/o SOB x 3 weeks, worse since the weekend.  Pt c/o productive cough with yellow sputum, wheezing, headache, and back pain.  EMS administered 3 albuterol nebs and 1 atrovent PTA.  Pt alert, oriented, audible wheezing.

## 2012-11-27 NOTE — ED Notes (Signed)
Pt continues with loud expiratory wheezing, sleeping currently.  O2 sat 93% on 3 liters nasal canula.  States that her headache continues.

## 2012-11-27 NOTE — Telephone Encounter (Signed)
Unable to reach patient. Left message on machine to go to hospital asap

## 2012-11-27 NOTE — Telephone Encounter (Signed)
Please advise 

## 2012-11-27 NOTE — ED Notes (Signed)
No improvement with 1 hour neb, MD made aware.

## 2012-11-27 NOTE — Telephone Encounter (Signed)
What does she need refilled?

## 2012-11-28 ENCOUNTER — Encounter (HOSPITAL_COMMUNITY): Payer: Self-pay | Admitting: Anesthesiology

## 2012-11-28 ENCOUNTER — Inpatient Hospital Stay (HOSPITAL_COMMUNITY): Payer: Medicare Other

## 2012-11-28 ENCOUNTER — Inpatient Hospital Stay (HOSPITAL_COMMUNITY): Payer: Medicare Other | Admitting: Anesthesiology

## 2012-11-28 DIAGNOSIS — G9341 Metabolic encephalopathy: Secondary | ICD-10-CM | POA: Diagnosis not present

## 2012-11-28 DIAGNOSIS — J9600 Acute respiratory failure, unspecified whether with hypoxia or hypercapnia: Secondary | ICD-10-CM | POA: Diagnosis not present

## 2012-11-28 DIAGNOSIS — J9620 Acute and chronic respiratory failure, unspecified whether with hypoxia or hypercapnia: Secondary | ICD-10-CM | POA: Diagnosis not present

## 2012-11-28 DIAGNOSIS — Z4682 Encounter for fitting and adjustment of non-vascular catheter: Secondary | ICD-10-CM | POA: Diagnosis not present

## 2012-11-28 DIAGNOSIS — J189 Pneumonia, unspecified organism: Secondary | ICD-10-CM | POA: Diagnosis not present

## 2012-11-28 DIAGNOSIS — G894 Chronic pain syndrome: Secondary | ICD-10-CM | POA: Diagnosis not present

## 2012-11-28 DIAGNOSIS — J449 Chronic obstructive pulmonary disease, unspecified: Secondary | ICD-10-CM | POA: Diagnosis not present

## 2012-11-28 LAB — BLOOD GAS, ARTERIAL
Acid-Base Excess: 13.2 mmol/L — ABNORMAL HIGH (ref 0.0–2.0)
Acid-Base Excess: 14.5 mmol/L — ABNORMAL HIGH (ref 0.0–2.0)
Bicarbonate: 42.5 mEq/L — ABNORMAL HIGH (ref 20.0–24.0)
Drawn by: 22223
MECHVT: 460 mL
MECHVT: 460 mL
O2 Content: 3 L/min
O2 Saturation: 86.6 %
Patient temperature: 37
Patient temperature: 37
RATE: 15 resp/min
RATE: 15 resp/min
TCO2: 40.1 mmol/L (ref 0–100)
TCO2: 38.6 mmol/L (ref 0–100)
pCO2 arterial: 134 mmHg (ref 35.0–45.0)
pCO2 arterial: 70.1 mmHg (ref 35.0–45.0)
pH, Arterial: 7.234 — ABNORMAL LOW (ref 7.350–7.450)
pH, Arterial: 7.397 (ref 7.350–7.450)
pO2, Arterial: 62.4 mmHg — ABNORMAL LOW (ref 80.0–100.0)

## 2012-11-28 LAB — URINALYSIS, ROUTINE W REFLEX MICROSCOPIC
Glucose, UA: NEGATIVE mg/dL
Ketones, ur: NEGATIVE mg/dL
Leukocytes, UA: NEGATIVE
Nitrite: NEGATIVE
Specific Gravity, Urine: >1.03 — ABNORMAL HIGH (ref 1.005–1.030)
pH: 6 (ref 5.0–8.0)

## 2012-11-28 LAB — CBC
Hemoglobin: 13.9 g/dL (ref 12.0–15.0)
MCHC: 28.8 g/dL — ABNORMAL LOW (ref 30.0–36.0)
RDW: 14.8 % (ref 11.5–15.5)
WBC: 7.1 10*3/uL (ref 4.0–10.5)

## 2012-11-28 LAB — BASIC METABOLIC PANEL
BUN: 5 mg/dL — ABNORMAL LOW (ref 6–23)
Creatinine, Ser: 0.44 mg/dL — ABNORMAL LOW (ref 0.50–1.10)
GFR calc Af Amer: >90 mL/min (ref >90)
GFR calc non Af Amer: >90 mL/min (ref >90)
Potassium: 4.9 mEq/L (ref 3.5–5.1)

## 2012-11-28 LAB — GLUCOSE, CAPILLARY: Glucose-Capillary: 112 mg/dL — ABNORMAL HIGH (ref 70–99)

## 2012-11-28 LAB — URINE MICROSCOPIC-ADD ON

## 2012-11-28 LAB — MRSA PCR SCREENING: MRSA by PCR: POSITIVE — AB

## 2012-11-28 MED ORDER — MIDAZOLAM HCL 2 MG/2ML IJ SOLN
2.0000 mg | Freq: Once | INTRAMUSCULAR | Status: AC
  Administered 2012-11-28: 2 mg via INTRAVENOUS

## 2012-11-28 MED ORDER — CHLORHEXIDINE GLUCONATE CLOTH 2 % EX PADS
6.0000 | MEDICATED_PAD | Freq: Every day | CUTANEOUS | Status: DC
  Administered 2012-11-28 – 2012-11-30 (×3): 6 via TOPICAL

## 2012-11-28 MED ORDER — MIDAZOLAM HCL 2 MG/2ML IJ SOLN
INTRAMUSCULAR | Status: AC
  Administered 2012-11-28: 2 mg via INTRAVENOUS
  Filled 2012-11-28: qty 2

## 2012-11-28 MED ORDER — BIOTENE DRY MOUTH MT LIQD
15.0000 mL | Freq: Four times a day (QID) | OROMUCOSAL | Status: DC
  Administered 2012-11-29 – 2012-12-01 (×9): 15 mL via OROMUCOSAL

## 2012-11-28 MED ORDER — MUPIROCIN 2 % EX OINT
1.0000 "application " | TOPICAL_OINTMENT | Freq: Two times a day (BID) | CUTANEOUS | Status: AC
  Administered 2012-11-28 – 2012-12-02 (×10): 1 via NASAL
  Filled 2012-11-28 (×3): qty 22

## 2012-11-28 MED ORDER — MIDAZOLAM HCL 2 MG/2ML IJ SOLN
2.0000 mg | INTRAMUSCULAR | Status: DC | PRN
  Filled 2012-11-28: qty 4

## 2012-11-28 MED ORDER — CHLORHEXIDINE GLUCONATE 0.12 % MT SOLN
15.0000 mL | Freq: Two times a day (BID) | OROMUCOSAL | Status: DC
  Administered 2012-11-28: 15 mL via OROMUCOSAL
  Filled 2012-11-28 (×2): qty 15

## 2012-11-28 MED ORDER — CHLORHEXIDINE GLUCONATE 0.12 % MT SOLN
15.0000 mL | Freq: Two times a day (BID) | OROMUCOSAL | Status: DC
  Administered 2012-11-28 – 2012-12-01 (×6): 15 mL via OROMUCOSAL
  Filled 2012-11-28 (×4): qty 15

## 2012-11-28 MED ORDER — PANTOPRAZOLE SODIUM 40 MG IV SOLR
40.0000 mg | Freq: Every day | INTRAVENOUS | Status: DC
  Administered 2012-11-28 – 2012-12-02 (×5): 40 mg via INTRAVENOUS
  Filled 2012-11-28 (×5): qty 40

## 2012-11-28 MED ORDER — ETOMIDATE 2 MG/ML IV SOLN
INTRAVENOUS | Status: DC | PRN
  Administered 2012-11-28: 8 mg via INTRAVENOUS

## 2012-11-28 MED ORDER — FUROSEMIDE 10 MG/ML IJ SOLN
40.0000 mg | Freq: Once | INTRAMUSCULAR | Status: AC
  Administered 2012-11-28: 40 mg via INTRAVENOUS
  Filled 2012-11-28: qty 4

## 2012-11-28 MED ORDER — LEVOFLOXACIN IN D5W 500 MG/100ML IV SOLN
INTRAVENOUS | Status: AC
  Filled 2012-11-28: qty 100

## 2012-11-28 MED ORDER — BIOTENE DRY MOUTH MT LIQD: 15.0000 mL | Freq: Two times a day (BID) | OROMUCOSAL | Status: DC

## 2012-11-28 MED ORDER — PROPOFOL 10 MG/ML IV EMUL
5.0000 ug/kg/min | INTRAVENOUS | Status: DC
  Administered 2012-11-28: 55 ug/kg/min via INTRAVENOUS
  Administered 2012-11-29: 55.031 ug/kg/min via INTRAVENOUS
  Administered 2012-11-29 (×3): 55 ug/kg/min via INTRAVENOUS
  Administered 2012-11-29: 35 ug/kg/min via INTRAVENOUS
  Administered 2012-11-29: 10 ug/kg/min via INTRAVENOUS
  Administered 2012-11-29: 55 ug/kg/min via INTRAVENOUS
  Administered 2012-11-29 (×2): 55.031 ug/kg/min via INTRAVENOUS
  Administered 2012-11-30 (×3): 55 ug/kg/min via INTRAVENOUS
  Filled 2012-11-28 (×4): qty 100
  Filled 2012-11-28: qty 200
  Filled 2012-11-28 (×4): qty 100
  Filled 2012-11-28: qty 200
  Filled 2012-11-28 (×3): qty 100

## 2012-11-28 MED ORDER — ALBUTEROL SULFATE (5 MG/ML) 0.5% IN NEBU
INHALATION_SOLUTION | RESPIRATORY_TRACT | Status: AC
  Administered 2012-11-28: 2.5 mg
  Filled 2012-11-28: qty 1

## 2012-11-28 MED ORDER — ROCURONIUM BROMIDE 100 MG/10ML IV SOLN
INTRAVENOUS | Status: DC | PRN
  Administered 2012-11-28: 25 mg via INTRAVENOUS

## 2012-11-28 MED ORDER — PROPOFOL 10 MG/ML IV EMUL
INTRAVENOUS | Status: AC
  Administered 2012-11-28: 1000 mg
  Filled 2012-11-28: qty 100

## 2012-11-28 MED ORDER — MIDAZOLAM HCL 5 MG/5ML IJ SOLN
INTRAMUSCULAR | Status: DC | PRN
  Administered 2012-11-28 (×2): 1 mg via INTRAVENOUS

## 2012-11-28 MED ORDER — FENTANYL CITRATE 0.05 MG/ML IJ SOLN
50.0000 ug | INTRAMUSCULAR | Status: DC | PRN
  Administered 2012-11-29: 100 ug via INTRAVENOUS
  Administered 2012-11-30 – 2012-12-02 (×3): 50 ug via INTRAVENOUS
  Filled 2012-11-28 (×5): qty 2

## 2012-11-28 MED ORDER — IPRATROPIUM BROMIDE 0.02 % IN SOLN
RESPIRATORY_TRACT | Status: AC
  Filled 2012-11-28: qty 2.5

## 2012-11-28 NOTE — Progress Notes (Addendum)
[facility] PROGRESS NOTE  Bethany Villa ZOX:[id number] DOB: 1956-08-23 DOA: 11/27/2012 PCP: Bennie Pierini, FNP  Assessment/Plan: 1. Acute encephalopathy, likely metabolic. We will check ABG to assess PCO2 and pH level. I suspect that her PCO2 will be elevated she will require BiPAP therapy. She will likely need transfer to a step down unit. Stat does not report that she's received a sedative medications today. 2. Acute on chronic respiratory failure. Continue supportive oxygen. 3. COPD exacerbation. Continue steroids, antibiotics, nebulizer treatments. May need BiPAP as above. 4. Tobacco use. Patient is on nicotine patch. 5. Chronic pain disorder. Hold pain medications in setting of encephalopathy. 6. Lower extremity edema. Will hold IV fluids and give one dose of Lasix  Code Status: full code Family Communication:  Disposition Plan: will likely need transfer to SDU   Consultants:  none  Procedures:  none  Antibiotics: Levaquin 3/31  HPI/Subjective: Patient is very lethargic today.  Shakes her head yes and no to answer questions. Keeps eyes closed  Objective: Filed Vitals:   11/28/12 0330 11/28/12 0559 11/28/12 0703 11/28/12 1148  BP:  123/73    Pulse:  86    Temp:  97.8 F (36.6 C)    TempSrc:      Resp:  20    Height: 5\' 3"  (1.6 m)     Weight: 90.7 kg (199 lb 15.3 oz)     SpO2:  96% 94% 94%    Intake/Output Summary (Last 24 hours) at 11/28/12 1343 Last data filed at 11/28/12 0900  Gross per 24 hour  Intake    120 ml  Output      0 ml  Net    120 ml   Filed Weights   11/28/12 0330  Weight: 90.7 kg (199 lb 15.3 oz)    Exam:   General:  Lethargic, only shaking head yes and no, keeps eyes closed  Cardiovascular: S1, S2 rrr  Respiratory: bilateral exp wheezes  Abdomen: soft, nt, nd, bs+  Musculoskeletal: 1+ pedal edema b/l   Data Reviewed: Basic Metabolic Panel:  Recent Labs Lab 11/27/12 1534 11/28/12 0602  NA 145 143  K 3.7  4.9  CL 101 102  CO2 38* 39*  GLUCOSE 135* 128*  BUN 5* 5*  CREATININE 0.50 0.44*  CALCIUM 8.9 8.8   Liver Function Tests: No results found for this basename: AST, ALT, ALKPHOS, BILITOT, PROT, ALBUMIN,  in the last 168 hours No results found for this basename: LIPASE, AMYLASE,  in the last 168 hours No results found for this basename: AMMONIA,  in the last 168 hours CBC:  Recent Labs Lab 11/27/12 1534 11/28/12 0602  WBC 11.2* 7.1  NEUTROABS 9.0*  --   HGB 14.2 13.9  HCT 46.7* 48.3*  MCV 94.7 96.8  PLT 202 212   Cardiac Enzymes:  Recent Labs Lab 11/27/12 1534  TROPONINI <0.30   BNP (last 3 results)  Recent Labs  11/27/12 1534  PROBNP 228.4*   CBG: No results found for this basename: GLUCAP,  in the last 168 hours  Recent Results (from the past 240 hour(s))  MRSA PCR SCREENING     Status: Abnormal   Collection Time    11/28/12  2:08 AM      Result Value Range Status   MRSA by PCR POSITIVE (*) NEGATIVE Final   Comment:            The GeneXpert MRSA Assay (FDA     approved for NASAL specimens  only), is one component of a     comprehensive MRSA colonization     surveillance program. It is not     intended to diagnose MRSA     infection nor to guide or     monitor treatment for     MRSA infections.     RESULT CALLED TO, READ BACK BY AND VERIFIED WITH:     WILSON A AT 0400 ON [id number] BY FORSYTH K     Studies: Dg Chest [facility] 1 View  11/27/2012  *RADIOLOGY REPORT*  Clinical Data: Cough.  PORTABLE CHEST - 1 VIEW  Comparison: 11/15/2011  Findings: 1545 hours.  Low volume film. The cardiopericardial silhouette is enlarged.  No focal airspace consolidation or pulmonary edema.  No pleural effusion. Imaged bony structures of the thorax are intact. Telemetry leads overlie the chest.  IMPRESSION: Low volume film without acute cardiopulmonary findings.   Original Report Authenticated By: Kennith Center, M.D.     Scheduled Meds: . sodium chloride   Intravenous STAT   . benzonatate  200 mg Oral TID  . budesonide-formoterol  2 puff Inhalation BID  . buPROPion  150 mg Oral Daily  . Chlorhexidine Gluconate Cloth  6 each Topical Q0600  . DULoxetine  60 mg Oral Daily  . enoxaparin (LOVENOX) injection  40 mg Subcutaneous Q24H  . guaiFENesin  600 mg Oral BID  . ipratropium  0.5 mg Nebulization Q4H  . levalbuterol  0.63 mg Nebulization Q4H  . levofloxacin (LEVAQUIN) IV  500 mg Intravenous Q24H  . methylPREDNISolone (SOLU-MEDROL) injection  60 mg Intravenous Q6H  . montelukast  10 mg Oral Daily  . mupirocin ointment  1 application Nasal BID  . nicotine  21 mg Transdermal Daily  . olopatadine  1 drop Both Eyes BID  . pregabalin  150 mg Oral BID  . sodium chloride  3 mL Intravenous Q12H  . traZODone  75 mg Oral QHS   Continuous Infusions: . 0.9 % NaCl with KCl 20 mEq / L 75 mL/hr at 11/28/12 1022    Principal Problem:   COPD (chronic obstructive pulmonary disease) Active Problems:   Smoking   Chronic pain disorder   Metabolic encephalopathy   Acute-on-chronic respiratory failure    Time spent:    [name],[name]  [facility] Pager [phone]. If 7PM-7AM, please contact night-coverage at [email or web address], password [facility] 11/28/2012, 1:43 PM  LOS: 1 day    Addendum:   blood gas reveals pH of 7.12 and PCO2 131. Patient will be transferred to the ICU for BiPAP therapy. I tried calling her emergency contact, Diamond Nickel. She is unavailable and I left a message for her.   [name],[name]   Addendum:  Repeat blood gas after 1 hour on bipap did not show any significant improvement.  Patient is still extremely lethargic. Will need to intubate patient to improve gas exchange.  Pulmonology consulted to assist with vent management.

## 2012-11-28 NOTE — Care Management Note (Signed)
    Page 1 of 2   12/06/2012     1:30:41 PM   CARE MANAGEMENT NOTE 12/06/2012  Patient:  Bethany Villa, Bethany Villa   Account Number:  [id number]  Date Initiated:  11/28/2012  Documentation initiated by:  Rosemary Holms  Subjective/Objective Assessment:   Pt admitted from home with COPD. today transfer to ICU and will be intubated. Lives at home alone. O2 at home     Action/Plan:   CM spoke with pt and she feels she could benefit from [facility] and a rolling walker   Anticipated DC Date:  12/06/2012   Anticipated DC Plan:  HOME W HOME HEALTH SERVICES      DC Planning Services  CM consult      Choice offered to / List presented to:     DME arranged  WALKER - Lavone Nian      DME agency  [facility]     [facility] arranged  HH-1 RN  HH-10 DISEASE MANAGEMENT  HH-2 PT  HH-4 NURSE'S AIDE  HH-6 SOCIAL WORKER      HH agency  [facility]   Status of service:  In process, will continue to follow Medicare Important Message given?  YES (If response is "NO", the following Medicare IM given date fields will be blank) Date Medicare IM given:  12/05/2012 Date Additional Medicare IM given:    Discharge Disposition:  HOME W HOME HEALTH SERVICES  Per UR Regulation:    If discussed at Long Length of Stay Meetings, dates discussed:   12/05/2012    Comments:  12/05/12 Rosemary Holms RN BSN CM [facility] notified of pending DC tomorrow  11/28/12 Rosemary Holms RN BSN CM

## 2012-11-28 NOTE — Progress Notes (Signed)
UR Chart Review Completed  

## 2012-11-28 NOTE — Procedures (Signed)
Intubation Procedure Note Bethany Villa [id number] 1955/09/25  Procedure: Intubation Indications: Respiratory insufficiency  Procedure Details Consent: Unable to obtain consent because of altered level of consciousness. Time Out: Verified patient identification, verified procedure, site/side was marked, verified correct patient position, special equipment/implants available, medications/allergies/relevent history reviewed, required imaging and test results available.  Performed  Maximum sterile technique was used including antiseptics and gloves.  MAC and 4    Evaluation Hemodynamic Status: BP stable throughout; O2 sats: stable throughout Patient's Current Condition: stable Complications: No apparent complications Patient did tolerate procedure well. Chest X-ray ordered to verify placement.  CXR: pending.   Katheren Shams 11/28/2012

## 2012-11-28 NOTE — Progress Notes (Signed)
E-link MD updated on ABG's PH=7.39 co2=70.1 po2 69.5 Bic=42.2 sat=95 NO orders given at this time

## 2012-11-28 NOTE — Anesthesia Preprocedure Evaluation (Addendum)
Anesthesia Evaluation  Patient identified by MRN, date of birth, ID bandGeneral Assessment Comment:lethargic  Reviewed: Allergy & Precautions, NPO status , Patient's Chart, lab work & pertinent test results  Airway Mallampati: II TM Distance: <3 FB     Dental  (+) Edentulous Lower   Pulmonary COPD COPD inhaler,    + decreased breath sounds+ wheezing      Cardiovascular Rhythm:Regular Rate:Tachycardia     Neuro/Psych    GI/Hepatic   Endo/Other    Renal/GU      Musculoskeletal   Abdominal (+) + scaphoid   Peds  Hematology   Anesthesia Other Findings   Reproductive/Obstetrics                          Anesthesia Physical Anesthesia Plan  ASA: III and emergent  Anesthesia Plan:    Post-op Pain Management:    Induction:   Airway Management Planned:   Additional Equipment:   Intra-op Plan:   Post-operative Plan:   Informed Consent:   Plan Discussed with:   Anesthesia Plan Comments:         Anesthesia Quick Evaluation

## 2012-11-28 NOTE — Progress Notes (Signed)
eLink Physician-Brief Progress Note Patient Name: Bethany Villa DOB: [birth year] MRN: [id number]  Date of Service  11/28/2012   HPI/Events of Note   ABG shows near nml pH - compensated resp acidosis  eICU Interventions  Place OG tube Dc trazodone, mucinex & tessalon   Intervention Category Major Interventions: Acid-Base disturbance - evaluation and management  Ryn Peine V. 11/28/2012, 9:27 PM

## 2012-11-28 NOTE — Progress Notes (Signed)
CRITICAL VALUE ALERT  Critical value received:  Ph= 7.129 PCo2= 134 P02= 62 HCO3= 42.5  Date of notification:  11/28/2012  Time of notification:  1553  Critical value read back:yes  Nurse who received alert:  Tory Emerald, RN  MD notified (1st page):  Dr Kerry Hough  Time of first page:  1553  MD notified (2nd page):  Time of second page:  Responding MD:  Dr Kerry Hough  Time MD responded:  [phone]

## 2012-11-29 ENCOUNTER — Inpatient Hospital Stay (HOSPITAL_COMMUNITY): Payer: Medicare Other

## 2012-11-29 DIAGNOSIS — J189 Pneumonia, unspecified organism: Secondary | ICD-10-CM

## 2012-11-29 DIAGNOSIS — J9600 Acute respiratory failure, unspecified whether with hypoxia or hypercapnia: Secondary | ICD-10-CM | POA: Diagnosis not present

## 2012-11-29 DIAGNOSIS — J9620 Acute and chronic respiratory failure, unspecified whether with hypoxia or hypercapnia: Secondary | ICD-10-CM | POA: Diagnosis not present

## 2012-11-29 DIAGNOSIS — Z9911 Dependence on respirator [ventilator] status: Secondary | ICD-10-CM | POA: Diagnosis not present

## 2012-11-29 DIAGNOSIS — J449 Chronic obstructive pulmonary disease, unspecified: Secondary | ICD-10-CM | POA: Diagnosis not present

## 2012-11-29 LAB — CBC
HCT: 46.5 % — ABNORMAL HIGH (ref 36.0–46.0)
Hemoglobin: 13.4 g/dL (ref 12.0–15.0)
MCH: 27.8 pg (ref 26.0–34.0)
MCHC: 28.8 g/dL — ABNORMAL LOW (ref 30.0–36.0)
MCV: 96.5 fL (ref 78.0–100.0)
Platelets: 194 K/uL (ref 150–400)
RBC: 4.82 MIL/uL (ref 3.87–5.11)
RDW: 14.8 % (ref 11.5–15.5)
WBC: 6.3 K/uL (ref 4.0–10.5)

## 2012-11-29 LAB — BASIC METABOLIC PANEL
BUN: 16 mg/dL (ref 6–23)
CO2: 39 mEq/L — ABNORMAL HIGH (ref 19–32)
Chloride: 98 mEq/L (ref 96–112)
Glucose, Bld: 124 mg/dL — ABNORMAL HIGH (ref 70–99)
Potassium: 4.2 mEq/L (ref 3.5–5.1)
Sodium: 141 mEq/L (ref 135–145)

## 2012-11-29 LAB — GLUCOSE, CAPILLARY
Glucose-Capillary: 126 mg/dL — ABNORMAL HIGH (ref 70–99)
Glucose-Capillary: 116 mg/dL — ABNORMAL HIGH (ref 70–99)

## 2012-11-29 LAB — BLOOD GAS, ARTERIAL
Acid-Base Excess: 16.1 mmol/L — ABNORMAL HIGH (ref 0.0–2.0)
Bicarbonate: 41.3 meq/L — ABNORMAL HIGH (ref 20.0–24.0)
Drawn by: 21310
FIO2: 60 %
MECHVT: 460 mL
O2 Saturation: 94 %
PEEP: 5 cmH2O
Patient temperature: 37
RATE: 15 {breaths}/min
TCO2: 36.2 mmol/L (ref 0–100)
pCO2 arterial: 60.8 mmHg (ref 35.0–45.0)
pH, Arterial: 7.447 (ref 7.350–7.450)
pO2, Arterial: 59.6 mmHg — ABNORMAL LOW (ref 80.0–100.0)

## 2012-11-29 LAB — STREP PNEUMONIAE URINARY ANTIGEN: Strep Pneumo Urinary Antigen: NEGATIVE

## 2012-11-29 LAB — URINE CULTURE
Colony Count: NO GROWTH
Culture: NO GROWTH

## 2012-11-29 MED ORDER — VANCOMYCIN HCL IN DEXTROSE 1-5 GM/200ML-% IV SOLN
1000.0000 mg | Freq: Three times a day (TID) | INTRAVENOUS | Status: AC
  Administered 2012-11-29 – 2012-12-06 (×20): 1000 mg via INTRAVENOUS
  Filled 2012-11-29 (×21): qty 200

## 2012-11-29 MED ORDER — LEVOFLOXACIN IN D5W 750 MG/150ML IV SOLN
750.0000 mg | INTRAVENOUS | Status: DC
  Administered 2012-11-29 – 2012-12-02 (×4): 750 mg via INTRAVENOUS
  Filled 2012-11-29 (×6): qty 150

## 2012-11-29 MED ORDER — SODIUM CHLORIDE 0.9 % IV SOLN
INTRAVENOUS | Status: DC
  Administered 2012-11-29 – 2012-12-01 (×3): via INTRAVENOUS

## 2012-11-29 NOTE — Progress Notes (Addendum)
Subjective: She continues intubated on the ventilator. She had fever to  102 earlier this morning. She is somewhat responsive but is sedated  Objective: Vital signs in last 24 hours: Temp:  [97.6 F (36.4 C)-102.2 F (39 C)] 100.4 F (38 C) (04/02 0730) Pulse Rate:  [95-125] 104 (04/02 0600) Resp:  [13-24] 15 (04/02 0600) BP: (97-163)/(61-110) 120/79 mmHg (04/02 0600) SpO2:  [93 %-100 %] 95 % (04/02 0600) FiO2 (%):  [59.3 %-60.5 %] 60.3 % (04/02 0600) Weight:  [106 kg (233 lb 11 oz)-106.9 kg (235 lb 10.8 oz)] 106.9 kg (235 lb 10.8 oz) (04/02 0500) Weight change: 15.3 kg (33 lb 11.7 oz) Last BM Date: 11/27/12  Intake/Output from previous day: 04/01 0701 - 04/02 0700 In: 2418.7 [P.O.:120; I.V.:2098.7; IV Piggyback:200] Out: 2100 [Urine:2100]  PHYSICAL EXAM General appearance: morbidly obese and Intubated and sedated Resp: rhonchi bilaterally Cardio: regular rate and rhythm, S1, S2 normal, no murmur, click, rub or gallop GI: soft, non-tender; bowel sounds normal; no masses,  no organomegaly Extremities: extremities normal, atraumatic, no cyanosis or edema  Lab Results:    Basic Metabolic Panel:  Recent Labs  14/78/29 0602 11/29/12 0505  NA 143 141  K 4.9 4.2  CL 102 98  CO2 39* 39*  GLUCOSE 128* 124*  BUN 5* 16  CREATININE 0.44* 0.62  CALCIUM 8.8 8.9   Liver Function Tests: No results found for this basename: AST, ALT, ALKPHOS, BILITOT, PROT, ALBUMIN,  in the last 72 hours No results found for this basename: LIPASE, AMYLASE,  in the last 72 hours No results found for this basename: AMMONIA,  in the last 72 hours CBC:  Recent Labs  11/27/12 1534 11/28/12 0602 11/29/12 0505  WBC 11.2* 7.1 6.3  NEUTROABS 9.0*  --   --   HGB 14.2 13.9 13.4  HCT 46.7* 48.3* 46.5*  MCV 94.7 96.8 96.5  PLT 202 212 194   Cardiac Enzymes:  Recent Labs  11/27/12 1534  TROPONINI <0.30   BNP:  Recent Labs  11/27/12 1534  PROBNP 228.4*   D-Dimer: No results found for  this basename: DDIMER,  in the last 72 hours CBG:  Recent Labs  11/28/12 1605 11/29/12 0728  GLUCAP 112* 126*   Hemoglobin A1C: No results found for this basename: HGBA1C,  in the last 72 hours Fasting Lipid Panel: No results found for this basename: CHOL, HDL, LDLCALC, TRIG, CHOLHDL, LDLDIRECT,  in the last 72 hours Thyroid Function Tests: No results found for this basename: TSH, T4TOTAL, FREET4, T3FREE, THYROIDAB,  in the last 72 hours Anemia Panel: No results found for this basename: VITAMINB12, FOLATE, FERRITIN, TIBC, IRON, RETICCTPCT,  in the last 72 hours Coagulation: No results found for this basename: LABPROT, INR,  in the last 72 hours Urine Drug Screen: Drugs of Abuse  No results found for this basename: labopia, cocainscrnur, labbenz, amphetmu, thcu, labbarb    Alcohol Level: No results found for this basename: ETH,  in the last 72 hours Urinalysis:  Recent Labs  11/28/12 1620  COLORURINE YELLOW  LABSPEC >1.030*  PHURINE 6.0  GLUCOSEU NEGATIVE  HGBUR NEGATIVE  BILIRUBINUR NEGATIVE  KETONESUR NEGATIVE  PROTEINUR 100*  UROBILINOGEN 0.2  NITRITE NEGATIVE  LEUKOCYTESUR NEGATIVE   Misc. Labs:  ABGS  Recent Labs  11/29/12 0530  PHART 7.447  PO2ART 59.6*  TCO2 36.2  HCO3 41.3*   CULTURES Recent Results (from the past 240 hour(s))  MRSA PCR SCREENING     Status: Abnormal   Collection Time  11/28/12  2:08 AM      Result Value Range Status   MRSA by PCR POSITIVE (*) NEGATIVE Final   Comment:            The GeneXpert MRSA Assay (FDA     approved for NASAL specimens     only), is one component of a     comprehensive MRSA colonization     surveillance program. It is not     intended to diagnose MRSA     infection nor to guide or     monitor treatment for     MRSA infections.     RESULT CALLED TO, READ BACK BY AND VERIFIED WITH:     WILSON A AT 0400 ON [id number] BY FORSYTH K   Studies/Results: Dg Chest Port 1 View  11/28/2012  *RADIOLOGY REPORT*   Clinical Data: Respiratory failure, intubation  PORTABLE CHEST - 1 VIEW  Comparison: 11/27/2012; 11/15/2011  Findings:  Grossly unchanged cardiac silhouette and mediastinal contours given patient rotation.  Endotracheal tube overlies the tracheal air column with tip superior to the carina.  An enteric tube terminates below the left hemidiaphragm.  Grossly unchanged bibasilar heterogeneous opacities.  No new focal airspace opacity.  No evidence of pulmonary edema, pleural effusion or pneumothorax. Grossly unchanged bones.  IMPRESSION:  Appropriately positioned support apparatus as above.  No pneumothorax.   Original Report Authenticated By: Tacey Ruiz, MD    Dg Chest Port 1 View  11/27/2012  *RADIOLOGY REPORT*  Clinical Data: Cough.  PORTABLE CHEST - 1 VIEW  Comparison: 11/15/2011  Findings: 1545 hours.  Low volume film. The cardiopericardial silhouette is enlarged.  No focal airspace consolidation or pulmonary edema.  No pleural effusion. Imaged bony structures of the thorax are intact. Telemetry leads overlie the chest.  IMPRESSION: Low volume film without acute cardiopulmonary findings.   Original Report Authenticated By: Kennith Center, M.D.     Medications:  Prior to Admission:  Prescriptions prior to admission  Medication Sig Dispense Refill  . acetaminophen (TYLENOL) 325 MG tablet Take 325 mg by mouth 4 (four) times daily as needed for pain (Taken with Oxycodone 10 mg as needed for pain).      Marland Kitchen albuterol (PROAIR HFA) 108 (90 BASE) MCG/ACT inhaler Inhale 2 puffs into the lungs every 6 (six) hours as needed for wheezing or shortness of breath.      Marland Kitchen albuterol (PROVENTIL) (2.5 MG/3ML) 0.083% nebulizer solution Take 2.5 mg by nebulization every 6 (six) hours as needed. For shortness of breath      . budesonide-formoterol (SYMBICORT) 160-4.5 MCG/ACT inhaler Inhale 2 puffs into the lungs 2 (two) times daily.  1 Inhaler  0  . buPROPion (WELLBUTRIN XL) 150 MG 24 hr tablet Take 150 mg by mouth every  morning.       . diazepam (VALIUM) 5 MG tablet Take 5 mg by mouth every 8 (eight) hours as needed. For anxiety      . diclofenac (VOLTAREN) 75 MG EC tablet Take 75 mg by mouth 2 (two) times daily.      . diclofenac sodium (VOLTAREN) 1 % GEL Apply 1 application topically 4 (four) times daily as needed. To knee for pain      . DULoxetine (CYMBALTA) 60 MG capsule Take 60 mg by mouth daily.        . fluticasone (FLONASE) 50 MCG/ACT nasal spray Place 2 sprays into the nose daily.      Marland Kitchen levalbuterol (XOPENEX HFA) 45 MCG/ACT inhaler  Inhale 1-2 puffs into the lungs every 4 (four) hours as needed. For shortness of breath      . montelukast (SINGULAIR) 10 MG tablet Take 10 mg by mouth daily.       Marland Kitchen olopatadine (PATANOL) 0.1 % ophthalmic solution Place 1 drop into both eyes 2 (two) times daily.       . Oxycodone HCl 10 MG TABS Take 10 mg by mouth 4 (four) times daily as needed (for pain).      Marland Kitchen tiotropium (SPIRIVA) 18 MCG inhalation capsule Place 18 mcg into inhaler and inhale daily.      Marland Kitchen zolpidem (AMBIEN) 10 MG tablet Take 10 mg by mouth at bedtime as needed. For sleep       Scheduled: . antiseptic oral rinse  15 mL Mouth Rinse q12n4p  . antiseptic oral rinse  15 mL Mouth Rinse QID  . benzonatate  200 mg Oral TID  . budesonide-formoterol  2 puff Inhalation BID  . buPROPion  150 mg Oral Daily  . chlorhexidine  15 mL Mouth Rinse BID  . chlorhexidine  15 mL Mouth Rinse BID  . Chlorhexidine Gluconate Cloth  6 each Topical Q0600  . DULoxetine  60 mg Oral Daily  . enoxaparin (LOVENOX) injection  40 mg Subcutaneous Q24H  . guaiFENesin  600 mg Oral BID  . ipratropium  0.5 mg Nebulization Q4H  . levalbuterol  0.63 mg Nebulization Q4H  . levofloxacin (LEVAQUIN) IV  500 mg Intravenous Q24H  . methylPREDNISolone (SOLU-MEDROL) injection  60 mg Intravenous Q6H  . montelukast  10 mg Oral Daily  . mupirocin ointment  1 application Nasal BID  . nicotine  21 mg Transdermal Daily  . olopatadine  1 drop Both  Eyes BID  . pantoprazole (PROTONIX) IV  40 mg Intravenous Daily  . pregabalin  150 mg Oral BID  . sodium chloride  3 mL Intravenous Q12H   Continuous: . 0.9 % NaCl with KCl 20 mEq / Villa 75 mL/hr at 11/28/12 2300  . propofol 55.031 mcg/kg/min (11/29/12 0630)   ZOX:[id number], acetaminophen, fentaNYL, levalbuterol, midazolam, ondansetron (ZOFRAN) IV, ondansetron  Assesment: She has acute on chronic respiratory failure. She has fever or in a right lower lobe area it looks like she may have pneumonia. She is on Levaquin but considering the fever we need to broaden her antibiotic spectrum. She remains on 60% oxygen so she is not ready for weaning or extubation at this point. Principal Problem:   COPD (chronic obstructive pulmonary disease) Active Problems:   Smoking   Chronic pain disorder   Metabolic encephalopathy   Acute-on-chronic respiratory failure    Plan: Continue ventilator support. Broadening antibiotic coverage    LOS: 2 days   Bethany Villa 11/29/2012, 8:05 AM

## 2012-11-29 NOTE — Progress Notes (Signed)
[facility] PROGRESS NOTE  Bethany Villa ZOX:[id number] DOB: 1956/03/14 DOA: 11/27/2012 PCP: Bethany Pierini, FNP Pulmonologist: Dr. Sherene Sires  Assessment: 1. Acute respiratory failure, respiratory acidosis: Multifactorial, developing pneumonia, COPD exacerbation superimposed on chronic respiratory failure. Broaden abx, continue vent pending improvement. Discussed with Dr. Juanetta Gosling. 2. COPD exacerbation: IV steroids, antibiotics, nebulizers. 3. Acute encephalopathy: Secondary to respiratory acidosis. 4. COPD, chronic respiratory failure on home oxygen: Plan as above. 5. Ongoing cigarette smoking: Will counsel cessation. 6. Fibromyalgia 7. Chronic pain 8. Anxiety  Plan: 1. Remain in ICU 2. Broaden antibiotics 3. Ventilator management per pulmonology 4. Place TED hose  Code Status: Full code Family Communication: Discussed with sister [name] Disposition Plan: Home when improved  Brendia Sacks, MD  [facility]  Pager [phone] If 7PM-7AM, please contact night-coverage at [email or web address], password [facility] 11/29/2012, 7:19 AM  LOS: 2 days   Brief narrative: 57 year old woman history of oxygen-dependent COPD presented with three-day history upper respiratory symptoms and increasing shortness of breath. She was admitted to the medical floor for COPD exacerbation. She became increasingly somnolent, ABG revealed acute respiratory acidosis patient was transferred to the ICU and placed on BiPAP. She failed to improve and was subsequently intubated 4/1. Pulmonology was consulted.  Consultants:  Pulmonology  Procedures:  Intubation 4/1 >>  Antibiotics:  Levofloxacin 3/31 >>  HPI/Subjective: Fever 102.2. Normotensive. Oxygen saturation mid 90s on 60% FiO2. ABG shows continued improvement in PCO2 and resolution of acidosis. Discussed with RN--temperature down this morning. Wakeup assessment pending.  Objective: Filed Vitals:   11/29/12 0515 11/29/12 0530 11/29/12  0545 11/29/12 0600  BP: 111/70 110/74 110/70 120/79  Pulse: 104 104 105 104  Temp:      TempSrc:      Resp: 15 15 15 15   Height:      Weight:      SpO2: 97% 96% 96% 95%    Intake/Output Summary (Last 24 hours) at 11/29/12 0719 Last data filed at 11/29/12 0500  Gross per 24 hour  Intake 2418.67 ml  Output   2100 ml  Net 318.67 ml   Filed Weights   11/28/12 0330 11/28/12 1429 11/29/12 0500  Weight: 90.7 kg (199 lb 15.3 oz) 106 kg (233 lb 11 oz) 106.9 kg (235 lb 10.8 oz)    Exam:  General:  Examined in the ICU. Sedated, intubated. Appears comfortable, calm. Eyes: Pupils appear normal. Lids and irises appear unremarkable. ENT: Grossly normal lips. Neck: Appears unremarkable. Cardiovascular: RRR, no m/r/g. 1+ bilateral LE edema. Telemetry: SR, no arrhythmias  Respiratory: Wheezes bilaterally, very poor air movement. No air trapping on ventilator waveform. Abdomen: soft, ntnd Skin: no rash or induration seen  Musculoskeletal: Appears grossly unremarkable. Psychiatric: Cannot assess. Neurologic: Appears unremarkable. Assessment Limited.  Data Reviewed: Basic Metabolic Panel:  Recent Labs Lab 11/27/12 1534 11/28/12 0602 11/29/12 0505  NA 145 143 141  K 3.7 4.9 4.2  CL 101 102 98  CO2 38* 39* 39*  GLUCOSE 135* 128* 124*  BUN 5* 5* 16  CREATININE 0.50 0.44* 0.62  CALCIUM 8.9 8.8 8.9   CBC:  Recent Labs Lab 11/27/12 1534 11/28/12 0602 11/29/12 0505  WBC 11.2* 7.1 6.3  NEUTROABS 9.0*  --   --   HGB 14.2 13.9 13.4  HCT 46.7* 48.3* 46.5*  MCV 94.7 96.8 96.5  PLT 202 212 194   Cardiac Enzymes:  Recent Labs Lab 11/27/12 1534  TROPONINI <0.30   BNP (last 3 results)  Recent Labs  11/27/12 1534  PROBNP 228.4*  CBG:  Recent Labs Lab 11/28/12 1605  GLUCAP 112*   ABG    Component Value Date/Time   PHART 7.447 11/29/2012 0530   PCO2ART 60.8* 11/29/2012 0530   PO2ART 59.6* 11/29/2012 0530   HCO3 41.3* 11/29/2012 0530   TCO2 36.2 11/29/2012 0530   O2SAT  94.0 11/29/2012 0530     Recent Results (from the past 240 hour(s))  MRSA PCR SCREENING     Status: Abnormal   Collection Time    11/28/12  2:08 AM      Result Value Range Status   MRSA by PCR POSITIVE (*) NEGATIVE Final   Comment:            The GeneXpert MRSA Assay (FDA     approved for NASAL specimens     only), is one component of a     comprehensive MRSA colonization     surveillance program. It is not     intended to diagnose MRSA     infection nor to guide or     monitor treatment for     MRSA infections.     RESULT CALLED TO, READ BACK BY AND VERIFIED WITH:     WILSON A AT 0400 ON [id number] BY FORSYTH K     Studies: Dg Chest Port 1 View  11/28/2012  *RADIOLOGY REPORT*  Clinical Data: Respiratory failure, intubation  PORTABLE CHEST - 1 VIEW  Comparison: 11/27/2012; 11/15/2011  Findings:  Grossly unchanged cardiac silhouette and mediastinal contours given patient rotation.  Endotracheal tube overlies the tracheal air column with tip superior to the carina.  An enteric tube terminates below the left hemidiaphragm.  Grossly unchanged bibasilar heterogeneous opacities.  No new focal airspace opacity.  No evidence of pulmonary edema, pleural effusion or pneumothorax. Grossly unchanged bones.  IMPRESSION:  Appropriately positioned support apparatus as above.  No pneumothorax.   Original Report Authenticated By: Tacey Ruiz, MD    Dg Chest Port 1 View  11/27/2012  *RADIOLOGY REPORT*  Clinical Data: Cough.  PORTABLE CHEST - 1 VIEW  Comparison: 11/15/2011  Findings: 1545 hours.  Low volume film. The cardiopericardial silhouette is enlarged.  No focal airspace consolidation or pulmonary edema.  No pleural effusion. Imaged bony structures of the thorax are intact. Telemetry leads overlie the chest.  IMPRESSION: Low volume film without acute cardiopulmonary findings.   Original Report Authenticated By: Kennith Center, M.D.     Scheduled Meds: . antiseptic oral rinse  15 mL Mouth Rinse q12n4p  .  antiseptic oral rinse  15 mL Mouth Rinse QID  . benzonatate  200 mg Oral TID  . budesonide-formoterol  2 puff Inhalation BID  . buPROPion  150 mg Oral Daily  . chlorhexidine  15 mL Mouth Rinse BID  . chlorhexidine  15 mL Mouth Rinse BID  . Chlorhexidine Gluconate Cloth  6 each Topical Q0600  . DULoxetine  60 mg Oral Daily  . enoxaparin (LOVENOX) injection  40 mg Subcutaneous Q24H  . guaiFENesin  600 mg Oral BID  . ipratropium  0.5 mg Nebulization Q4H  . levalbuterol  0.63 mg Nebulization Q4H  . levofloxacin (LEVAQUIN) IV  500 mg Intravenous Q24H  . methylPREDNISolone (SOLU-MEDROL) injection  60 mg Intravenous Q6H  . montelukast  10 mg Oral Daily  . mupirocin ointment  1 application Nasal BID  . nicotine  21 mg Transdermal Daily  . olopatadine  1 drop Both Eyes BID  . pantoprazole (PROTONIX) IV  40 mg Intravenous Daily  . pregabalin  150 mg Oral BID  . sodium chloride  3 mL Intravenous Q12H   Continuous Infusions: . 0.9 % NaCl with KCl 20 mEq / L 75 mL/hr at 11/28/12 2300  . propofol 55.031 mcg/kg/min (11/29/12 0630)    Principal Problem:   COPD (chronic obstructive pulmonary disease) Active Problems:   Smoking   Chronic pain disorder   Metabolic encephalopathy   Acute-on-chronic respiratory failure     Brendia Sacks, MD  [facility] Pager [phone] If 7PM-7AM, please contact night-coverage at [email or web address], password [facility] 11/29/2012, 7:19 AM  LOS: 2 days   Time spent: 30 minutes

## 2012-11-29 NOTE — Consult Note (Signed)
NAMEGLENDORA, CLOUATRE                ACCOUNT NO.:  [id number]  MEDICAL RECORD NO.:  [id number]  LOCATION:  IC07                          FACILITY:  [facility]  PHYSICIAN:  Jaiyon Wander L. Juanetta Gosling, M.D.DATE OF BIRTH:  March 03, 1956  DATE OF CONSULTATION: DATE OF DISCHARGE:                                CONSULTATION   REASON FOR CONSULTATION:  Respiratory failure.  HISTORY:  Ms. Totman is a 57 year old who has a history of COPD and chronic respiratory failure.  She came to the emergency room because of increasing shortness of breath in the last 3 years or so.  She has been exposed to a sick child and has had cough, congestion, runny nose, shortness of breath, and wheezing.  She denies any chest pain.  She was found to have COPD exacerbation and admitted and then today was increasingly somnolent.  Had a blood gas done and it showed that her pCO2 is markedly elevated.  She was transferred to the Intensive Care unit, intubated, and is now on mechanical ventilation.  PAST MEDICAL HISTORY:  Positive for COPD, fibromyalgia, degenerative joint disease, chronic pain syndrome, anxiety and depression, some sort of sleep disorder, and I am not sure what that is.  Surgically, she has had back surgery, carpal tunnel release, ankle surgery, knee surgery, C- section, tonsillectomy, and tubal ligation.  SOCIAL HISTORY:  She has about a 60-pack-year smoking history.  She does use alcohol on occasion.  Does not use any illicit drugs..  ALLERGIES:  She is allergic to PENICILLIN, SULFA, and MOBIC.  FAMILY HISTORY:  Her mother has a history of breast cancer.  MEDICATIONS:  At home, she was taking Tylenol as needed, albuterol inhaler, albuterol via  nebulizer, Symbicort 160/4.5 two puffs b.i.d., Voltaren 75 mg b.i.d., Voltaren gel as needed, Cymbalta 60 mg daily, Flonase 50 mcg 2 sprays in the nose daily, Xopenex as needed, Singulair 10 mg daily, Patanol ophthalmic solution 1 drop each eye twice a day, oxycodone 10  mg q.i.d. as needed for pain, Spiriva 18 mcg daily, zolpidem as needed.  All of this information is from the medical record, because she is intubated.  No family is present.  She can't give me any history.  PHYSICAL EXAMINATION:  GENERAL:  She is intubated and on the ventilator. VITAL SIGNS:  Her temperature is 98.9, pulse 104, respirations 27, blood pressure 140/69. HEENT:  Her pupils are reactive.  Nose and throat are clear.  Mucous membranes are dry. NECK:  Supple. CHEST:  Relatively clear with some rhonchi. HEART:  Regular. ABDOMEN:  Soft. EXTREMITIES:  No edema. CENTRAL NERVOUS SYSTEM:  Apparently is intact.  She is sedated now, it is difficult to tell.  ASSESSMENT:  She has chronic obstructive pulmonary disease with acute on chronic respiratory failure.  She is now intubated and on the ventilator.  I agree with the use of antibiotics and steroids and current doses.  I do not think there is really anything to add at this point.  I will plan to follow with you.  Thank you very much for allowing me to see her with you.     Kinnedy Mongiello L. Juanetta Gosling, M.D.     ELH/MEDQ  D:  11/28/2012  T:  11/29/2012  Job:  [id number]

## 2012-11-29 NOTE — Progress Notes (Signed)
INITIAL NUTRITION ASSESSMENT  DOCUMENTATION CODES Per approved criteria  -Extreme Obesity Class III   INTERVENTION: Recommend  when propofol rate is decreased add Prostat 60 ml QID.  Will provide 120 gr protein, 800 kcal/day.  NUTRITION DIAGNOSIS: Inadequate oral intake related to inability to eat as evidenced by NPO status.  Goal: Enteral nutrition to provide 60-70% of estimated calorie needs (22-25 kcals/kg ideal body weight) and 100% of estimated protein needs, based on ASPEN guidelines for permissive underfeeding in critically ill obese individuals.  Monitor:  Nutrition support measures, labs and wt changes  Reason for Assessment: Vent status  57 y.o. female  Admitting Dx: COPD (chronic obstructive pulmonary disease)  ASSESSMENT: Patient is currently intubated on ventilator support.   MV: 7 ml  Temp:Temp (24hrs), Avg:99.5 F (37.5 C), Min:97.6 F (36.4 C), Max:102.2 F (39 C)  Propofol: 35 ml/hr caloric load 924 kcal lipids; if continued at current rate  Height: Ht Readings from Last 1 Encounters:  11/28/12 5\' 3"  (1.6 m)    Weight: Wt Readings from Last 1 Encounters:  11/29/12 235 lb 10.8 oz (106.9 kg)    Ideal Body Weight: 115# (52.2 kg)  % Ideal Body Weight: 205%  Wt Readings from Last 10 Encounters:  11/29/12 235 lb 10.8 oz (106.9 kg)  07/15/12 200 lb (90.719 kg)  03/27/12 200 lb (90.719 kg)  01/17/12 220 lb (99.791 kg)  11/15/11 220 lb (99.791 kg)  10/08/11 229 lb 15 oz (104.3 kg)  08/10/11 230 lb 6.1 oz (104.5 kg)  06/01/11 238 lb (107.956 kg)  05/23/11 244 lb 0.8 oz (110.7 kg)  05/03/11 200 lb (90.719 kg)    Usual Body Weight: wt hx widely variable; noted abve  % Usual Body Weight: unknown  BMI:  Body mass index is 41.76 kg/(m^2).Extreme Obesity Class III  Estimated Nutritional Needs: Kcal: 1856 (9562-1308 kcal/day to meet 60-70% of estimated energy needs)  Protein: 130 gr  Fluid: >1900 ml/day  Skin: No issues noted  Diet Order:  NPO  EDUCATION NEEDS: -No education needs identified at this time   Intake/Output Summary (Last 24 hours) at 11/29/12 1453 Last data filed at 11/29/12 0913  Gross per 24 hour  Intake 3042.17 ml  Output   2100 ml  Net 942.17 ml    Last BM: 11/27/12  Labs:   Recent Labs Lab 11/27/12 1534 11/28/12 0602 11/29/12 0505  NA 145 143 141  K 3.7 4.9 4.2  CL 101 102 98  CO2 38* 39* 39*  BUN 5* 5* 16  CREATININE 0.50 0.44* 0.62  CALCIUM 8.9 8.8 8.9  GLUCOSE 135* 128* 124*    CBG (last 3)   Recent Labs  11/28/12 1605 11/29/12 0728 11/29/12 1128  GLUCAP 112* 126* 138*    Scheduled Meds: . antiseptic oral rinse  15 mL Mouth Rinse q12n4p  . antiseptic oral rinse  15 mL Mouth Rinse QID  . budesonide-formoterol  2 puff Inhalation BID  . buPROPion  150 mg Oral Daily  . chlorhexidine  15 mL Mouth Rinse BID  . Chlorhexidine Gluconate Cloth  6 each Topical Q0600  . DULoxetine  60 mg Oral Daily  . enoxaparin (LOVENOX) injection  40 mg Subcutaneous Q24H  . guaiFENesin  600 mg Oral BID  . ipratropium  0.5 mg Nebulization Q4H  . levalbuterol  0.63 mg Nebulization Q4H  . levofloxacin (LEVAQUIN) IV  750 mg Intravenous Q24H  . methylPREDNISolone (SOLU-MEDROL) injection  60 mg Intravenous Q6H  . montelukast  10 mg Oral Daily  .  mupirocin ointment  1 application Nasal BID  . nicotine  21 mg Transdermal Daily  . olopatadine  1 drop Both Eyes BID  . pantoprazole (PROTONIX) IV  40 mg Intravenous Daily  . sodium chloride  3 mL Intravenous Q12H  . vancomycin  1,000 mg Intravenous Q8H    Continuous Infusions: . sodium chloride 50 mL/hr at 11/29/12 0913  . propofol 55 mcg/kg/min (11/29/12 1442)    Past Medical History  Diagnosis Date  . Fibromyalgia   . Asthma   . DJD (degenerative joint disease)   . Chronic pain   . Depression   . Anxiety   . Panic attack   . Sleep disorder   . DJD (degenerative joint disease)     Past Surgical History  Procedure Laterality Date  .  Abdominal surgery    . Back surgery    . Carpal tunnel release    . Ankle surgery    . Knee surgery    . Cesarean section    . Tonsillectomy    . Tubal ligation     Royann Shivers MS,RD,LDN,CSG Office: [phone] Pager: [phone]

## 2012-11-29 NOTE — Progress Notes (Signed)
ANTIBIOTIC CONSULT NOTE - INITIAL  Pharmacy Consult for Vancomycin  Indication: rule out pneumonia  Allergies  Allergen Reactions  . Penicillins Shortness Of Breath and Swelling  . Sulfa Antibiotics Shortness Of Breath and Swelling  . Mobic (Meloxicam)    Patient Measurements: Height: 5\' 3"  (160 cm) Weight: 235 lb 10.8 oz (106.9 kg) IBW/kg (Calculated) : 52.4  Vital Signs: Temp: 100.4 F (38 C) (04/02 0730) Temp src: Axillary (04/02 0730) BP: 120/79 mmHg (04/02 0600) Pulse Rate: 104 (04/02 0600) Intake/Output from previous day: 04/01 0701 - 04/02 0700 In: 2418.7 [P.O.:120; I.V.:2098.7; IV Piggyback:200] Out: 2100 [Urine:2100] Intake/Output from this shift:    Labs:  Recent Labs  11/27/12 1534 11/28/12 0602 11/29/12 0505  WBC 11.2* 7.1 6.3  HGB 14.2 13.9 13.4  PLT 202 212 194  CREATININE 0.50 0.44* 0.62   Estimated Creatinine Clearance: 93.1 ml/min (by C-G formula based on Cr of 0.62). No results found for this basename: VANCOTROUGH, Leodis Binet, VANCORANDOM, GENTTROUGH, GENTPEAK, GENTRANDOM, TOBRATROUGH, TOBRAPEAK, TOBRARND, AMIKACINPEAK, AMIKACINTROU, AMIKACIN,  in the last 72 hours   Microbiology: Recent Results (from the past 720 hour(s))  MRSA PCR SCREENING     Status: Abnormal   Collection Time    11/28/12  2:08 AM      Result Value Range Status   MRSA by PCR POSITIVE (*) NEGATIVE Final   Comment:            The GeneXpert MRSA Assay (FDA     approved for NASAL specimens     only), is one component of a     comprehensive MRSA colonization     surveillance program. It is not     intended to diagnose MRSA     infection nor to guide or     monitor treatment for     MRSA infections.     RESULT CALLED TO, READ BACK BY AND VERIFIED WITH:     WILSON A AT 0400 ON [id number] BY FORSYTH K   Medical History: Past Medical History  Diagnosis Date  . Fibromyalgia   . Asthma   . DJD (degenerative joint disease)   . Chronic pain   . Depression   . Anxiety   .  Panic attack   . Sleep disorder   . DJD (degenerative joint disease)    Medications:  Scheduled:  . [COMPLETED] albuterol      . antiseptic oral rinse  15 mL Mouth Rinse q12n4p  . antiseptic oral rinse  15 mL Mouth Rinse QID  . budesonide-formoterol  2 puff Inhalation BID  . buPROPion  150 mg Oral Daily  . chlorhexidine  15 mL Mouth Rinse BID  . chlorhexidine  15 mL Mouth Rinse BID  . Chlorhexidine Gluconate Cloth  6 each Topical Q0600  . DULoxetine  60 mg Oral Daily  . enoxaparin (LOVENOX) injection  40 mg Subcutaneous Q24H  . [COMPLETED] furosemide  40 mg Intravenous Once  . guaiFENesin  600 mg Oral BID  . [EXPIRED] ipratropium      . ipratropium  0.5 mg Nebulization Q4H  . levalbuterol  0.63 mg Nebulization Q4H  . levofloxacin (LEVAQUIN) IV  750 mg Intravenous Q24H  . methylPREDNISolone (SOLU-MEDROL) injection  60 mg Intravenous Q6H  . [COMPLETED] midazolam  2-4 mg Intravenous Once  . montelukast  10 mg Oral Daily  . mupirocin ointment  1 application Nasal BID  . nicotine  21 mg Transdermal Daily  . olopatadine  1 drop Both Eyes BID  . pantoprazole (  PROTONIX) IV  40 mg Intravenous Daily  . [COMPLETED] propofol      . sodium chloride  3 mL Intravenous Q12H  . vancomycin  1,000 mg Intravenous Q8H  . [DISCONTINUED] sodium chloride   Intravenous STAT  . [DISCONTINUED] benzonatate  200 mg Oral TID  . [DISCONTINUED] levofloxacin (LEVAQUIN) IV  500 mg Intravenous Q24H  . [DISCONTINUED] pregabalin  150 mg Oral BID  . [DISCONTINUED] traZODone  75 mg Oral QHS   Assessment: 56yo obese female with good renal fxn.  Estimated Creatinine Clearance: 93.1 ml/min (by C-G formula based on Cr of 0.62). Pt c/o persistent cough and SOB.    Goal of Therapy:  Vancomycin trough level 15-20 mcg/ml  Plan:  Vancomycin 1gm IV q8hrs Check trough at steady state Monitor labs, renal fxn, and cultures Duration of therapy per MD  Valrie Hart A 11/29/2012,8:25 AM

## 2012-11-30 ENCOUNTER — Inpatient Hospital Stay (HOSPITAL_COMMUNITY): Payer: Medicare Other

## 2012-11-30 DIAGNOSIS — J9620 Acute and chronic respiratory failure, unspecified whether with hypoxia or hypercapnia: Secondary | ICD-10-CM | POA: Diagnosis not present

## 2012-11-30 DIAGNOSIS — J449 Chronic obstructive pulmonary disease, unspecified: Secondary | ICD-10-CM | POA: Diagnosis not present

## 2012-11-30 DIAGNOSIS — Z4682 Encounter for fitting and adjustment of non-vascular catheter: Secondary | ICD-10-CM | POA: Diagnosis not present

## 2012-11-30 DIAGNOSIS — J9600 Acute respiratory failure, unspecified whether with hypoxia or hypercapnia: Secondary | ICD-10-CM | POA: Diagnosis not present

## 2012-11-30 DIAGNOSIS — J189 Pneumonia, unspecified organism: Secondary | ICD-10-CM | POA: Diagnosis not present

## 2012-11-30 DIAGNOSIS — J9819 Other pulmonary collapse: Secondary | ICD-10-CM | POA: Diagnosis not present

## 2012-11-30 LAB — GLUCOSE, CAPILLARY: Glucose-Capillary: 116 mg/dL — ABNORMAL HIGH (ref 70–99)

## 2012-11-30 LAB — BLOOD GAS, ARTERIAL
Acid-base deficit: 11.7 mmol/L — ABNORMAL HIGH (ref 0.0–2.0)
Bicarbonate: 37.9 mEq/L — ABNORMAL HIGH (ref 20.0–24.0)
Drawn by: 21694
FIO2: 40 %
MECHVT: 460 mL
O2 Saturation: 92.3 %
O2 Saturation: 91.4 %
PEEP: 5 cmH2O
PEEP: 5 cmH2O
Patient temperature: 37
Pressure support: 5 cmH2O
RATE: 15 resp/min
pO2, Arterial: 60.4 mmHg — ABNORMAL LOW (ref 80.0–100.0)

## 2012-11-30 LAB — LEGIONELLA ANTIGEN, URINE

## 2012-11-30 LAB — CBC
HCT: 43.6 % (ref 36.0–46.0)
Hemoglobin: 13.5 g/dL (ref 12.0–15.0)
MCV: 90.8 fL (ref 78.0–100.0)
WBC: 7 10*3/uL (ref 4.0–10.5)

## 2012-11-30 LAB — BASIC METABOLIC PANEL
BUN: 15 mg/dL (ref 6–23)
Chloride: 95 mEq/L — ABNORMAL LOW (ref 96–112)
Creatinine, Ser: 0.55 mg/dL (ref 0.50–1.10)
Glucose, Bld: 126 mg/dL — ABNORMAL HIGH (ref 70–99)
Potassium: 3.3 mEq/L — ABNORMAL LOW (ref 3.5–5.1)

## 2012-11-30 MED ORDER — POTASSIUM CHLORIDE 10 MEQ/100ML IV SOLN
10.0000 meq | INTRAVENOUS | Status: AC
Start: 2012-11-30 — End: 2012-11-30
  Administered 2012-11-30 (×4): 10 meq via INTRAVENOUS
  Filled 2012-11-30: qty 400

## 2012-11-30 NOTE — Progress Notes (Signed)
Subjective: She is undergoing weaning now. She is still intubated and on the ventilator. She's coughing some.  Objective: Vital signs in last 24 hours: Temp:  [98.7 F (37.1 C)-99.9 F (37.7 C)] 99.9 F (37.7 C) (04/03 0756) Pulse Rate:  [91-118] 95 (04/03 0500) Resp:  [15-20] 17 (04/03 0500) BP: (94-152)/(51-102) 148/86 mmHg (04/03 0500) SpO2:  [91 %-100 %] 92 % (04/03 0500) FiO2 (%):  [39.8 %-65.7 %] 40 % (04/03 0500) Weight:  [110.7 kg (244 lb 0.8 oz)] 110.7 kg (244 lb 0.8 oz) (04/03 0500) Weight change: 4.7 kg (10 lb 5.8 oz) Last BM Date: 11/27/12  Intake/Output from previous day: 04/02 0701 - 04/03 0700 In: 3125.3 [I.V.:2375.3; IV Piggyback:750] Out: 800 [Urine:800]  PHYSICAL EXAM General appearance: alert, cooperative, moderate distress and morbidly obese Resp: rhonchi bilaterally Cardio: regular rate and rhythm, S1, S2 normal, no murmur, click, rub or gallop GI: soft, non-tender; bowel sounds normal; no masses,  no organomegaly Extremities: extremities normal, atraumatic, no cyanosis or edema  Lab Results:    Basic Metabolic Panel:  Recent Labs  16/10/96 0505 11/30/12 0440  NA 141 138  K 4.2 3.3*  CL 98 95*  CO2 39* 35*  GLUCOSE 124* 126*  BUN 16 15  CREATININE 0.62 0.55  CALCIUM 8.9 8.6   Liver Function Tests: No results found for this basename: AST, ALT, ALKPHOS, BILITOT, PROT, ALBUMIN,  in the last 72 hours No results found for this basename: LIPASE, AMYLASE,  in the last 72 hours No results found for this basename: AMMONIA,  in the last 72 hours CBC:  Recent Labs  11/27/12 1534  11/29/12 0505 11/30/12 0440  WBC 11.2*  < > 6.3 7.0  NEUTROABS 9.0*  --   --   --   HGB 14.2  < > 13.4 13.5  HCT 46.7*  < > 46.5* 43.6  MCV 94.7  < > 96.5 90.8  PLT 202  < > 194 168  < > = values in this interval not displayed. Cardiac Enzymes:  Recent Labs  11/27/12 1534  TROPONINI <0.30   BNP:  Recent Labs  11/27/12 1534  PROBNP 228.4*    D-Dimer: No results found for this basename: DDIMER,  in the last 72 hours CBG:  Recent Labs  11/29/12 1128 11/29/12 1632 11/29/12 2012 11/29/12 2340 11/30/12 0422 11/30/12 0731  GLUCAP 138* 116* 118* 104* 131* 126*   Hemoglobin A1C: No results found for this basename: HGBA1C,  in the last 72 hours Fasting Lipid Panel: No results found for this basename: CHOL, HDL, LDLCALC, TRIG, CHOLHDL, LDLDIRECT,  in the last 72 hours Thyroid Function Tests: No results found for this basename: TSH, T4TOTAL, FREET4, T3FREE, THYROIDAB,  in the last 72 hours Anemia Panel: No results found for this basename: VITAMINB12, FOLATE, FERRITIN, TIBC, IRON, RETICCTPCT,  in the last 72 hours Coagulation: No results found for this basename: LABPROT, INR,  in the last 72 hours Urine Drug Screen: Drugs of Abuse  No results found for this basename: labopia, cocainscrnur, labbenz, amphetmu, thcu, labbarb    Alcohol Level: No results found for this basename: ETH,  in the last 72 hours Urinalysis:  Recent Labs  11/28/12 1620  COLORURINE YELLOW  LABSPEC >1.030*  PHURINE 6.0  GLUCOSEU NEGATIVE  HGBUR NEGATIVE  BILIRUBINUR NEGATIVE  KETONESUR NEGATIVE  PROTEINUR 100*  UROBILINOGEN 0.2  NITRITE NEGATIVE  LEUKOCYTESUR NEGATIVE   Misc. Labs:  ABGS  Recent Labs  11/30/12 0404  PHART 7.489*  PO2ART 58.0*  TCO2  33.0  HCO3 37.9*   CULTURES Recent Results (from the past 240 hour(s))  MRSA PCR SCREENING     Status: Abnormal   Collection Time    11/28/12  2:08 AM      Result Value Range Status   MRSA by PCR POSITIVE (*) NEGATIVE Final   Comment:            The GeneXpert MRSA Assay (FDA     approved for NASAL specimens     only), is one component of a     comprehensive MRSA colonization     surveillance program. It is not     intended to diagnose MRSA     infection nor to guide or     monitor treatment for     MRSA infections.     RESULT CALLED TO, READ BACK BY AND VERIFIED WITH:      WILSON A AT 0400 ON [id number] BY FORSYTH K  URINE CULTURE     Status: None   Collection Time    11/28/12  4:20 PM      Result Value Range Status   Specimen Description URINE, CATHETERIZED   Final   Special Requests NONE   Final   Culture  Setup Time 11/28/2012 18:45   Final   Colony Count NO GROWTH   Final   Culture NO GROWTH   Final   Report Status 11/29/2012 FINAL   Final  CULTURE, BLOOD (ROUTINE X 2)     Status: None   Collection Time    11/29/12  8:24 AM      Result Value Range Status   Specimen Description Blood   Final   Special Requests NONE   Final   Culture NO GROWTH <24 HRS   Final   Report Status PENDING   Incomplete  CULTURE, BLOOD (ROUTINE X 2)     Status: None   Collection Time    11/29/12  8:24 AM      Result Value Range Status   Specimen Description Blood   Final   Special Requests NONE   Final   Culture NO GROWTH <24 HRS   Final   Report Status PENDING   Incomplete  CULTURE, RESPIRATORY (NON-EXPECTORATED)     Status: None   Collection Time    11/29/12  2:37 PM      Result Value Range Status   Specimen Description TRACHEAL ASPIRATE   Final   Special Requests NONE   Final   Gram Stain     Final   Value: ABUNDANT WBC PRESENT, PREDOMINANTLY PMN     NO SQUAMOUS EPITHELIAL CELLS SEEN     FEW GRAM NEGATIVE RODS   Culture PENDING   Incomplete   Report Status PENDING   Incomplete   Studies/Results: Dg Chest Port 1 View  11/30/2012  *RADIOLOGY REPORT*  Clinical Data: Respiratory failure, on ventilator  PORTABLE CHEST - 1 VIEW  Comparison: Portable chest x-ray of 11/29/2012  Findings: The tip of the endotracheal tube is approximately 4.4 cm above the carina.  Bibasilar atelectasis remains.  Mild cardiomegaly is stable.  No bony abnormality is noted.  IMPRESSION: Endotracheal tip 4.4 cm above the carina.  Mild bibasilar atelectasis remains.   Original Report Authenticated By: Dwyane Dee, M.D.    Dg Chest Port 1 View  11/29/2012  *RADIOLOGY REPORT*  Clinical Data:  Respiratory distress.  Ventilator  PORTABLE CHEST - 1 VIEW  Comparison: 11/28/2012  Findings: Endotracheal tube in good position.  NG tube  has been pulled back and is now at the level of the aortic arch.  This should be advanced into the stomach.  Mild progression of right lower lobe airspace disease.  Negative for heart failure.    No significant effusion.  Underlying COPD.  IMPRESSION: NG tube has been pulled back and the tip is now at the level of the aortic arch.  This should be advanced into the stomach.  Endotracheal tube in good position.  Mild progression of right lower lobe atelectasis/infiltrate.   Original Report Authenticated By: Janeece Riggers, M.D.    Dg Chest Port 1 View  11/28/2012  *RADIOLOGY REPORT*  Clinical Data: Respiratory failure, intubation  PORTABLE CHEST - 1 VIEW  Comparison: 11/27/2012; 11/15/2011  Findings:  Grossly unchanged cardiac silhouette and mediastinal contours given patient rotation.  Endotracheal tube overlies the tracheal air column with tip superior to the carina.  An enteric tube terminates below the left hemidiaphragm.  Grossly unchanged bibasilar heterogeneous opacities.  No new focal airspace opacity.  No evidence of pulmonary edema, pleural effusion or pneumothorax. Grossly unchanged bones.  IMPRESSION:  Appropriately positioned support apparatus as above.  No pneumothorax.   Original Report Authenticated By: Tacey Ruiz, MD     Medications:  Prior to Admission:  Prescriptions prior to admission  Medication Sig Dispense Refill  . acetaminophen (TYLENOL) 325 MG tablet Take 325 mg by mouth 4 (four) times daily as needed for pain (Taken with Oxycodone 10 mg as needed for pain).      Marland Kitchen albuterol (PROAIR HFA) 108 (90 BASE) MCG/ACT inhaler Inhale 2 puffs into the lungs every 6 (six) hours as needed for wheezing or shortness of breath.      Marland Kitchen albuterol (PROVENTIL) (2.5 MG/3ML) 0.083% nebulizer solution Take 2.5 mg by nebulization every 6 (six) hours as needed. For  shortness of breath      . budesonide-formoterol (SYMBICORT) 160-4.5 MCG/ACT inhaler Inhale 2 puffs into the lungs 2 (two) times daily.  1 Inhaler  0  . buPROPion (WELLBUTRIN XL) 150 MG 24 hr tablet Take 150 mg by mouth every morning.       . diazepam (VALIUM) 5 MG tablet Take 5 mg by mouth every 8 (eight) hours as needed. For anxiety      . diclofenac (VOLTAREN) 75 MG EC tablet Take 75 mg by mouth 2 (two) times daily.      . diclofenac sodium (VOLTAREN) 1 % GEL Apply 1 application topically 4 (four) times daily as needed. To knee for pain      . DULoxetine (CYMBALTA) 60 MG capsule Take 60 mg by mouth daily.        . fluticasone (FLONASE) 50 MCG/ACT nasal spray Place 2 sprays into the nose daily.      Marland Kitchen levalbuterol (XOPENEX HFA) 45 MCG/ACT inhaler Inhale 1-2 puffs into the lungs every 4 (four) hours as needed. For shortness of breath      . montelukast (SINGULAIR) 10 MG tablet Take 10 mg by mouth daily.       Marland Kitchen olopatadine (PATANOL) 0.1 % ophthalmic solution Place 1 drop into both eyes 2 (two) times daily.       . Oxycodone HCl 10 MG TABS Take 10 mg by mouth 4 (four) times daily as needed (for pain).      Marland Kitchen tiotropium (SPIRIVA) 18 MCG inhalation capsule Place 18 mcg into inhaler and inhale daily.      Marland Kitchen zolpidem (AMBIEN) 10 MG tablet Take 10 mg by mouth at  bedtime as needed. For sleep       Scheduled: . antiseptic oral rinse  15 mL Mouth Rinse q12n4p  . antiseptic oral rinse  15 mL Mouth Rinse QID  . budesonide-formoterol  2 puff Inhalation BID  . buPROPion  150 mg Oral Daily  . chlorhexidine  15 mL Mouth Rinse BID  . Chlorhexidine Gluconate Cloth  6 each Topical Q0600  . DULoxetine  60 mg Oral Daily  . enoxaparin (LOVENOX) injection  40 mg Subcutaneous Q24H  . guaiFENesin  600 mg Oral BID  . ipratropium  0.5 mg Nebulization Q4H  . levalbuterol  0.63 mg Nebulization Q4H  . levofloxacin (LEVAQUIN) IV  750 mg Intravenous Q24H  . methylPREDNISolone (SOLU-MEDROL) injection  60 mg Intravenous  Q6H  . montelukast  10 mg Oral Daily  . mupirocin ointment  1 application Nasal BID  . nicotine  21 mg Transdermal Daily  . olopatadine  1 drop Both Eyes BID  . pantoprazole (PROTONIX) IV  40 mg Intravenous Daily  . sodium chloride  3 mL Intravenous Q12H  . vancomycin  1,000 mg Intravenous Q8H   Continuous: . sodium chloride 50 mL/hr at 11/30/12 0500  . propofol 12 mcg/kg/min (11/30/12 0733)   ZOX:[id number], acetaminophen, fentaNYL, levalbuterol, midazolam, ondansetron (ZOFRAN) IV, ondansetron  Assesment: She was admitted with COPD and has developed acute on chronic respiratory failure requiring ventilator support. She has pneumonia which is being treated. She developed a metabolic encephalopathy probably related to her elevated PCO2. She has improved and needs criteria for weaning now Principal Problem:   COPD (chronic obstructive pulmonary disease) Active Problems:   Smoking   Chronic pain disorder   Metabolic encephalopathy   Acute-on-chronic respiratory failure   CAP (community acquired pneumonia)    Plan: Continue the weaning process. She may be able to be extubated later today    LOS: 3 days   Rosie Torrez L 11/30/2012, 8:38 AM

## 2012-11-30 NOTE — Progress Notes (Signed)
ANTIBIOTIC CONSULT NOTE   Pharmacy Consult for Vancomycin  Indication: rule out pneumonia  Allergies  Allergen Reactions  . Penicillins Shortness Of Breath and Swelling  . Sulfa Antibiotics Shortness Of Breath and Swelling  . Mobic (Meloxicam)    Patient Measurements: Height: 5\' 3"  (160 cm) Weight: 244 lb 0.8 oz (110.7 kg) IBW/kg (Calculated) : 52.4  Vital Signs: Temp: 99.9 F (37.7 C) (04/03 0756) Temp src: Axillary (04/03 0756) BP: 148/86 mmHg (04/03 0500) Pulse Rate: 95 (04/03 0500) Intake/Output from previous day: 04/02 0701 - 04/03 0700 In: 3125.3 [I.V.:2375.3; IV Piggyback:750] Out: 800 [Urine:800] Intake/Output from this shift:    Labs:  Recent Labs  11/28/12 0602 11/29/12 0505 11/30/12 0440  WBC 7.1 6.3 7.0  HGB 13.9 13.4 13.5  PLT 212 194 168  CREATININE 0.44* 0.62 0.55   Estimated Creatinine Clearance: 95 ml/min (by C-G formula based on Cr of 0.55).  Recent Labs  11/30/12 0911  VANCOTROUGH 13.8    Microbiology: Recent Results (from the past 720 hour(s))  MRSA PCR SCREENING     Status: Abnormal   Collection Time    11/28/12  2:08 AM      Result Value Range Status   MRSA by PCR POSITIVE (*) NEGATIVE Final   Comment:            The GeneXpert MRSA Assay (FDA     approved for NASAL specimens     only), is one component of a     comprehensive MRSA colonization     surveillance program. It is not     intended to diagnose MRSA     infection nor to guide or     monitor treatment for     MRSA infections.     RESULT CALLED TO, READ BACK BY AND VERIFIED WITH:     WILSON A AT 0400 ON [id number] BY FORSYTH K  URINE CULTURE     Status: None   Collection Time    11/28/12  4:20 PM      Result Value Range Status   Specimen Description URINE, CATHETERIZED   Final   Special Requests NONE   Final   Culture  Setup Time 11/28/2012 18:45   Final   Colony Count NO GROWTH   Final   Culture NO GROWTH   Final   Report Status 11/29/2012 FINAL   Final  CULTURE,  BLOOD (ROUTINE X 2)     Status: None   Collection Time    11/29/12  8:24 AM      Result Value Range Status   Specimen Description BLOOD RIGHT WRIST   Final   Special Requests BOTTLES DRAWN AEROBIC ONLY 4CC   Final   Culture NO GROWTH 1 DAY   Final   Report Status PENDING   Incomplete  CULTURE, BLOOD (ROUTINE X 2)     Status: None   Collection Time    11/29/12  8:24 AM      Result Value Range Status   Specimen Description BLOOD RIGHT HAND   Final   Special Requests BOTTLES DRAWN AEROBIC AND ANAEROBIC 6CC   Final   Culture NO GROWTH 1 DAY   Final   Report Status PENDING   Incomplete  CULTURE, RESPIRATORY (NON-EXPECTORATED)     Status: None   Collection Time    11/29/12  2:37 PM      Result Value Range Status   Specimen Description TRACHEAL ASPIRATE   Final   Special Requests NONE   Final  Gram Stain     Final   Value: ABUNDANT WBC PRESENT, PREDOMINANTLY PMN     NO SQUAMOUS EPITHELIAL CELLS SEEN     FEW GRAM NEGATIVE RODS   Culture PENDING   Incomplete   Report Status PENDING   Incomplete   Medical History: Past Medical History  Diagnosis Date  . Fibromyalgia   . Asthma   . DJD (degenerative joint disease)   . Chronic pain   . Depression   . Anxiety   . Panic attack   . Sleep disorder   . DJD (degenerative joint disease)    Medications:  Scheduled:  . antiseptic oral rinse  15 mL Mouth Rinse q12n4p  . antiseptic oral rinse  15 mL Mouth Rinse QID  . budesonide-formoterol  2 puff Inhalation BID  . chlorhexidine  15 mL Mouth Rinse BID  . Chlorhexidine Gluconate Cloth  6 each Topical Q0600  . enoxaparin (LOVENOX) injection  40 mg Subcutaneous Q24H  . ipratropium  0.5 mg Nebulization Q4H  . levalbuterol  0.63 mg Nebulization Q4H  . levofloxacin (LEVAQUIN) IV  750 mg Intravenous Q24H  . methylPREDNISolone (SOLU-MEDROL) injection  60 mg Intravenous Q6H  . mupirocin ointment  1 application Nasal BID  . nicotine  21 mg Transdermal Daily  . olopatadine  1 drop Both Eyes  BID  . pantoprazole (PROTONIX) IV  40 mg Intravenous Daily  . potassium chloride  10 mEq Intravenous Q1 Hr x 4  . sodium chloride  3 mL Intravenous Q12H  . vancomycin  1,000 mg Intravenous Q8H  . [DISCONTINUED] buPROPion  150 mg Oral Daily  . [DISCONTINUED] DULoxetine  60 mg Oral Daily  . [DISCONTINUED] guaiFENesin  600 mg Oral BID  . [DISCONTINUED] montelukast  10 mg Oral Daily   Assessment: 56yo obese female with good renal fxn.  Estimated Creatinine Clearance: 95 ml/min (by C-G formula based on Cr of 0.55). Pt c/o persistent cough and SOB.  Pt is afebrile now and no leukocytosis.  Pt is slowly improving.  Trough level is on low end of range but acceptable.  Pt receiving Vancomycin 1gm IV q8hrs with excellent clearance.  Renal fxn is stable.  Goal of Therapy:  Vancomycin trough level 15-20 mcg/ml  Plan:  Continue Vancomycin 1gm IV q8hrs Check trough weekly Monitor labs, renal fxn, and cultures Duration of therapy per MD  Valrie Hart A 11/30/2012,10:26 AM

## 2012-11-30 NOTE — Progress Notes (Signed)
Notified Respiratory regarding pt's saturations dropping to 84 and having a very strong cough with some copious secretions. Also pt is having inspiratory and expiratory wheezing.

## 2012-11-30 NOTE — Progress Notes (Signed)
Pt was extubated at 0907 and tolerated well. Pt received mouth care prior to extubation and immediately after and also received a breathing treatment.

## 2012-11-30 NOTE — Progress Notes (Signed)
[facility] PROGRESS NOTE  BRAEDEN DOLINSKI ZOX:[id number] DOB: 02/19/1956 DOA: 11/27/2012 PCP: Bennie Pierini, FNP Pulmonologist: Dr. Sherene Sires  Assessment: 1. Acute respiratory failure, respiratory acidosis: Slowly improving with decreased FiO2 requirement and PCO2. Multifactorial: pneumonia, COPD exacerbation superimposed on chronic respiratory failure. Continue empiric abx, continue vent pending improvement. Appreciate pulmonology evaluation and ongoing care. 2. COPD exacerbation: Improving. Continue IV steroids, antibiotics, nebulizers. 3. Acute encephalopathy: Suspect resolved. Secondary to acute illness. 4. COPD, chronic respiratory failure on home oxygen: Plan as above. 5. Ongoing cigarette smoking: Will counsel cessation. 6. Fibromyalgia 7. Chronic pain 8. Anxiety  Plan: 1. Remain in ICU 2. Ventilator management per pulmonology, hopeful extubation less than 48 hours. 3. Replace potassium  Code Status: Full code Family Communication: Discussed with sister [name] Disposition Plan: Home when improved  Brendia Sacks, MD  [facility]  Pager [phone] If 7PM-7AM, please contact night-coverage at [email or web address], password [facility] 11/30/2012, 7:24 AM  LOS: 3 days   Brief narrative: 57 year old woman history of oxygen-dependent COPD presented with three-day history upper respiratory symptoms and increasing shortness of breath. She was admitted to the medical floor for COPD exacerbation. She became increasingly somnolent, ABG revealed acute respiratory acidosis patient was transferred to the ICU and placed on BiPAP. She failed to improve and was subsequently intubated 4/1. Pulmonology was consulted.  Consultants:  Pulmonology  Procedures:  Intubation 4/1 >>  Antibiotics:  Levofloxacin 3/31 >>  Vancomycin 4/2 >>  HPI/Subjective: Afebrile. Vital signs stable. Repeat ABG shows improvement this morning. FiO2 down to 40%. Patient awake, follows commands, able to  communicate nonverbally. Discussed with RN--no new issues.  Objective: Filed Vitals:   11/30/12 0200 11/30/12 0300 11/30/12 0400 11/30/12 0500  BP: 147/93 147/83 152/88 148/86  Pulse: 97 95 91 95  Temp:   99.3 F (37.4 C)   TempSrc:   Axillary   Resp: 18 16 15 17   Height:      Weight:    110.7 kg (244 lb 0.8 oz)  SpO2: 97% 91% 96% 92%    Intake/Output Summary (Last 24 hours) at 11/30/12 0724 Last data filed at 11/30/12 8295  Gross per 24 hour  Intake 3125.33 ml  Output    800 ml  Net 2325.33 ml   Filed Weights   11/28/12 1429 11/29/12 0500 11/30/12 0500  Weight: 106 kg (233 lb 11 oz) 106.9 kg (235 lb 10.8 oz) 110.7 kg (244 lb 0.8 oz)    Exam:  General:  Examined in the ICU.Intubated. Appears comfortable, calm. Currently awake and following commands. Currently on pressure support. Eyes: Pupils appear normal. Lids and irises appear unremarkable. ENT: Grossly normal lips. Cardiovascular: RRR, no m/r/g. 1+ bilateral LE edema. Telemetry: SR, no arrhythmias  Respiratory: Air movement remains decreased. Wheezes persists. No rhonchi or rales. Respiratory rate 30s on weaning trial. No air trapping on ventilator waveform. No distress noted. Musculoskeletal: Appears grossly unremarkable. Moves arms and legs to command. Psychiatric: Appears intact grossly. Neurologic: Appears unremarkable. As above.  Data Reviewed: Basic Metabolic Panel:  Recent Labs Lab 11/27/12 1534 11/28/12 0602 11/29/12 0505 11/30/12 0440  NA 145 143 141 138  K 3.7 4.9 4.2 3.3*  CL 101 102 98 95*  CO2 38* 39* 39* 35*  GLUCOSE 135* 128* 124* 126*  BUN 5* 5* 16 15  CREATININE 0.50 0.44* 0.62 0.55  CALCIUM 8.9 8.8 8.9 8.6   CBC:  Recent Labs Lab 11/27/12 1534 11/28/12 0602 11/29/12 0505 11/30/12 0440  WBC 11.2* 7.1 6.3 7.0  NEUTROABS 9.0*  --   --   --  HGB 14.2 13.9 13.4 13.5  HCT 46.7* 48.3* 46.5* 43.6  MCV 94.7 96.8 96.5 90.8  PLT 202 212 194 168   Cardiac Enzymes:  Recent Labs Lab  11/27/12 1534  TROPONINI <0.30   BNP (last 3 results)  Recent Labs  11/27/12 1534  PROBNP 228.4*   CBG:  Recent Labs Lab 11/29/12 1128 11/29/12 1632 11/29/12 2012 11/29/12 2340 11/30/12 0422  GLUCAP 138* 116* 118* 104* 131*   ABG    Component Value Date/Time   PHART 7.489* 11/30/2012 0404   PCO2ART 50.3* 11/30/2012 0404   PO2ART 58.0* 11/30/2012 0404   HCO3 37.9* 11/30/2012 0404   TCO2 33.0 11/30/2012 0404   O2SAT 92.3 11/30/2012 0404     Recent Results (from the past 240 hour(s))  MRSA PCR SCREENING     Status: Abnormal   Collection Time    11/28/12  2:08 AM      Result Value Range Status   MRSA by PCR POSITIVE (*) NEGATIVE Final   Comment:            The GeneXpert MRSA Assay (FDA     approved for NASAL specimens     only), is one component of a     comprehensive MRSA colonization     surveillance program. It is not     intended to diagnose MRSA     infection nor to guide or     monitor treatment for     MRSA infections.     RESULT CALLED TO, READ BACK BY AND VERIFIED WITH:     WILSON A AT 0400 ON [id number] BY FORSYTH K  URINE CULTURE     Status: None   Collection Time    11/28/12  4:20 PM      Result Value Range Status   Specimen Description URINE, CATHETERIZED   Final   Special Requests NONE   Final   Culture  Setup Time 11/28/2012 18:45   Final   Colony Count NO GROWTH   Final   Culture NO GROWTH   Final   Report Status 11/29/2012 FINAL   Final  CULTURE, BLOOD (ROUTINE X 2)     Status: None   Collection Time    11/29/12  8:24 AM      Result Value Range Status   Specimen Description Blood   Final   Special Requests NONE   Final   Culture NO GROWTH <24 HRS   Final   Report Status PENDING   Incomplete  CULTURE, BLOOD (ROUTINE X 2)     Status: None   Collection Time    11/29/12  8:24 AM      Result Value Range Status   Specimen Description Blood   Final   Special Requests NONE   Final   Culture NO GROWTH <24 HRS   Final   Report Status PENDING    Incomplete     Studies: Dg Chest Port 1 View  11/29/2012  *RADIOLOGY REPORT*  Clinical Data: Respiratory distress.  Ventilator  PORTABLE CHEST - 1 VIEW  Comparison: 11/28/2012  Findings: Endotracheal tube in good position.  NG tube has been pulled back and is now at the level of the aortic arch.  This should be advanced into the stomach.  Mild progression of right lower lobe airspace disease.  Negative for heart failure.    No significant effusion.  Underlying COPD.  IMPRESSION: NG tube has been pulled back and the tip is now at the level of the  aortic arch.  This should be advanced into the stomach.  Endotracheal tube in good position.  Mild progression of right lower lobe atelectasis/infiltrate.   Original Report Authenticated By: Janeece Riggers, M.D.    Dg Chest Port 1 View  11/28/2012  *RADIOLOGY REPORT*  Clinical Data: Respiratory failure, intubation  PORTABLE CHEST - 1 VIEW  Comparison: 11/27/2012; 11/15/2011  Findings:  Grossly unchanged cardiac silhouette and mediastinal contours given patient rotation.  Endotracheal tube overlies the tracheal air column with tip superior to the carina.  An enteric tube terminates below the left hemidiaphragm.  Grossly unchanged bibasilar heterogeneous opacities.  No new focal airspace opacity.  No evidence of pulmonary edema, pleural effusion or pneumothorax. Grossly unchanged bones.  IMPRESSION:  Appropriately positioned support apparatus as above.  No pneumothorax.   Original Report Authenticated By: Tacey Ruiz, MD     Scheduled Meds: . antiseptic oral rinse  15 mL Mouth Rinse q12n4p  . antiseptic oral rinse  15 mL Mouth Rinse QID  . budesonide-formoterol  2 puff Inhalation BID  . buPROPion  150 mg Oral Daily  . chlorhexidine  15 mL Mouth Rinse BID  . Chlorhexidine Gluconate Cloth  6 each Topical Q0600  . DULoxetine  60 mg Oral Daily  . enoxaparin (LOVENOX) injection  40 mg Subcutaneous Q24H  . guaiFENesin  600 mg Oral BID  . ipratropium  0.5 mg  Nebulization Q4H  . levalbuterol  0.63 mg Nebulization Q4H  . levofloxacin (LEVAQUIN) IV  750 mg Intravenous Q24H  . methylPREDNISolone (SOLU-MEDROL) injection  60 mg Intravenous Q6H  . montelukast  10 mg Oral Daily  . mupirocin ointment  1 application Nasal BID  . nicotine  21 mg Transdermal Daily  . olopatadine  1 drop Both Eyes BID  . pantoprazole (PROTONIX) IV  40 mg Intravenous Daily  . sodium chloride  3 mL Intravenous Q12H  . vancomycin  1,000 mg Intravenous Q8H   Continuous Infusions: . sodium chloride 50 mL/hr at 11/30/12 0500  . propofol 55 mcg/kg/min (11/30/12 1610)    Principal Problem:   COPD (chronic obstructive pulmonary disease) Active Problems:   Smoking   Chronic pain disorder   Metabolic encephalopathy   Acute-on-chronic respiratory failure   CAP (community acquired pneumonia)    Brendia Sacks, MD  [facility] Pager [phone] If 7PM-7AM, please contact night-coverage at [email or web address], password [facility] 11/30/2012, 7:24 AM  LOS: 3 days   Time spent: 20 minutes

## 2012-11-30 NOTE — Procedures (Signed)
Extubation Procedure Note  Patient Details:   Name: Bethany Villa DOB: 10/15/1955 MRN: [id number]   Airway Documentation:  Airway (Active)  Secured at (cm) 22 cm 11/30/2012  3:47 AM  Measured From Lips 11/30/2012  3:47 AM  Secured Location Right 11/30/2012  3:47 AM  Secured By [employer] 11/30/2012  3:47 AM  Tube Holder Repositioned Yes 11/30/2012  3:47 AM  Cuff Pressure (cm H2O) 24 cm H2O 11/30/2012  3:47 AM  Site Condition Dry 11/30/2012  3:47 AM    Evaluation  O2 sats: stable throughout Complications: No apparent complications Patient did tolerate procedure well. Bilateral Breath Sounds: Expiratory wheezes Suctioning: Oral;Airway Yes  Katheren Shams 11/30/2012, 9:20 AM

## 2012-12-01 DIAGNOSIS — J9620 Acute and chronic respiratory failure, unspecified whether with hypoxia or hypercapnia: Secondary | ICD-10-CM | POA: Diagnosis not present

## 2012-12-01 DIAGNOSIS — J189 Pneumonia, unspecified organism: Secondary | ICD-10-CM | POA: Diagnosis not present

## 2012-12-01 DIAGNOSIS — J449 Chronic obstructive pulmonary disease, unspecified: Secondary | ICD-10-CM | POA: Diagnosis not present

## 2012-12-01 DIAGNOSIS — J9600 Acute respiratory failure, unspecified whether with hypoxia or hypercapnia: Secondary | ICD-10-CM | POA: Diagnosis not present

## 2012-12-01 LAB — BASIC METABOLIC PANEL
CO2: 32 mEq/L (ref 19–32)
Chloride: 98 mEq/L (ref 96–112)
Potassium: 3.2 mEq/L — ABNORMAL LOW (ref 3.5–5.1)
Sodium: 140 mEq/L (ref 135–145)

## 2012-12-01 LAB — CBC
HCT: 43.9 % (ref 36.0–46.0)
MCV: 89.2 fL (ref 78.0–100.0)
RBC: 4.92 MIL/uL (ref 3.87–5.11)
WBC: 5.1 10*3/uL (ref 4.0–10.5)

## 2012-12-01 LAB — GLUCOSE, CAPILLARY
Glucose-Capillary: 163 mg/dL — ABNORMAL HIGH (ref 70–99)
Glucose-Capillary: 120 mg/dL — ABNORMAL HIGH (ref 70–99)

## 2012-12-01 MED ORDER — MONTELUKAST SODIUM 10 MG PO TABS
10.0000 mg | ORAL_TABLET | Freq: Every day | ORAL | Status: DC
  Administered 2012-12-01 – 2012-12-06 (×6): 10 mg via ORAL
  Filled 2012-12-01 (×6): qty 1

## 2012-12-01 MED ORDER — ZOLPIDEM TARTRATE 5 MG PO TABS
5.0000 mg | ORAL_TABLET | Freq: Every evening | ORAL | Status: DC | PRN
  Administered 2012-12-01 – 2012-12-05 (×5): 5 mg via ORAL
  Filled 2012-12-01 (×5): qty 1

## 2012-12-01 MED ORDER — BUPROPION HCL ER (XL) 150 MG PO TB24
150.0000 mg | ORAL_TABLET | Freq: Every day | ORAL | Status: DC
  Administered 2012-12-01 – 2012-12-06 (×7): 150 mg via ORAL
  Filled 2012-12-01 (×8): qty 1

## 2012-12-01 MED ORDER — DULOXETINE HCL 60 MG PO CPEP
60.0000 mg | ORAL_CAPSULE | Freq: Every day | ORAL | Status: DC
  Administered 2012-12-01 – 2012-12-06 (×6): 60 mg via ORAL
  Filled 2012-12-01 (×6): qty 1

## 2012-12-01 MED ORDER — POTASSIUM CHLORIDE CRYS ER 20 MEQ PO TBCR
40.0000 meq | EXTENDED_RELEASE_TABLET | Freq: Once | ORAL | Status: AC
  Administered 2012-12-01: 40 meq via ORAL
  Filled 2012-12-01: qty 2

## 2012-12-01 NOTE — Progress Notes (Signed)
Subjective: She was able to be extubated last night. She's complaining of trouble with secretions. She has no other new complaints  Objective: Vital signs in last 24 hours: Temp:  [98.2 F (36.8 C)-99.4 F (37.4 C)] 99.4 F (37.4 C) (04/04 0000) Pulse Rate:  [84-110] 86 (04/04 0800) Resp:  [15-29] 23 (04/04 0800) BP: (132-162)/(70-109) 134/109 mmHg (04/04 0800) SpO2:  [85 %-97 %] 91 % (04/04 0839) FiO2 (%):  [40 %-40.2 %] 40 % (04/04 0414) Weight:  [110.8 kg (244 lb 4.3 oz)] 110.8 kg (244 lb 4.3 oz) (04/04 0500) Weight change: 0.1 kg (3.5 oz) Last BM Date: 11/27/12  Intake/Output from previous day: 04/03 0701 - 04/04 0700 In: 2065 [P.O.:240; I.V.:875; IV Piggyback:950] Out: 3675 [Urine:3675]  PHYSICAL EXAM General appearance: alert, cooperative and mild distress Resp: clear to auscultation bilaterally Cardio: regular rate and rhythm, S1, S2 normal, no murmur, click, rub or gallop GI: soft, non-tender; bowel sounds normal; no masses,  no organomegaly Extremities: extremities normal, atraumatic, no cyanosis or edema  Lab Results:    Basic Metabolic Panel:  Recent Labs  16/10/96 0440 12/01/12 0504  NA 138 140  K 3.3* 3.2*  CL 95* 98  CO2 35* 32  GLUCOSE 126* 121*  BUN 15 15  CREATININE 0.55 0.49*  CALCIUM 8.6 8.7   Liver Function Tests: No results found for this basename: AST, ALT, ALKPHOS, BILITOT, PROT, ALBUMIN,  in the last 72 hours No results found for this basename: LIPASE, AMYLASE,  in the last 72 hours No results found for this basename: AMMONIA,  in the last 72 hours CBC:  Recent Labs  11/30/12 0440 12/01/12 0504  WBC 7.0 5.1  HGB 13.5 13.7  HCT 43.6 43.9  MCV 90.8 89.2  PLT 168 169   Cardiac Enzymes: No results found for this basename: CKTOTAL, CKMB, CKMBINDEX, TROPONINI,  in the last 72 hours BNP: No results found for this basename: PROBNP,  in the last 72 hours D-Dimer: No results found for this basename: DDIMER,  in the last 72  hours CBG:  Recent Labs  11/30/12 0731 11/30/12 1139 11/30/12 1622 11/30/12 2052 12/01/12 0017 12/01/12 0801  GLUCAP 126* 144* 112* 116* 99 122*   Hemoglobin A1C: No results found for this basename: HGBA1C,  in the last 72 hours Fasting Lipid Panel: No results found for this basename: CHOL, HDL, LDLCALC, TRIG, CHOLHDL, LDLDIRECT,  in the last 72 hours Thyroid Function Tests: No results found for this basename: TSH, T4TOTAL, FREET4, T3FREE, THYROIDAB,  in the last 72 hours Anemia Panel: No results found for this basename: VITAMINB12, FOLATE, FERRITIN, TIBC, IRON, RETICCTPCT,  in the last 72 hours Coagulation: No results found for this basename: LABPROT, INR,  in the last 72 hours Urine Drug Screen: Drugs of Abuse  No results found for this basename: labopia, cocainscrnur, labbenz, amphetmu, thcu, labbarb    Alcohol Level: No results found for this basename: ETH,  in the last 72 hours Urinalysis:  Recent Labs  11/28/12 1620  COLORURINE YELLOW  LABSPEC >1.030*  PHURINE 6.0  GLUCOSEU NEGATIVE  HGBUR NEGATIVE  BILIRUBINUR NEGATIVE  KETONESUR NEGATIVE  PROTEINUR 100*  UROBILINOGEN 0.2  NITRITE NEGATIVE  LEUKOCYTESUR NEGATIVE   Misc. Labs:  ABGS  Recent Labs  11/30/12 0833  PHART 7.448  PO2ART 60.4*  TCO2 33.3  HCO3 37.7*   CULTURES Recent Results (from the past 240 hour(s))  MRSA PCR SCREENING     Status: Abnormal   Collection Time    11/28/12  2:08 AM      Result Value Range Status   MRSA by PCR POSITIVE (*) NEGATIVE Final   Comment:            The GeneXpert MRSA Assay (FDA     approved for NASAL specimens     only), is one component of a     comprehensive MRSA colonization     surveillance program. It is not     intended to diagnose MRSA     infection nor to guide or     monitor treatment for     MRSA infections.     RESULT CALLED TO, READ BACK BY AND VERIFIED WITH:     WILSON A AT 0400 ON [id number] BY FORSYTH K  URINE CULTURE     Status: None    Collection Time    11/28/12  4:20 PM      Result Value Range Status   Specimen Description URINE, CATHETERIZED   Final   Special Requests NONE   Final   Culture  Setup Time 11/28/2012 18:45   Final   Colony Count NO GROWTH   Final   Culture NO GROWTH   Final   Report Status 11/29/2012 FINAL   Final  CULTURE, BLOOD (ROUTINE X 2)     Status: None   Collection Time    11/29/12  8:24 AM      Result Value Range Status   Specimen Description BLOOD RIGHT WRIST   Final   Special Requests BOTTLES DRAWN AEROBIC ONLY 4CC   Final   Culture NO GROWTH 2 DAYS   Final   Report Status PENDING   Incomplete  CULTURE, BLOOD (ROUTINE X 2)     Status: None   Collection Time    11/29/12  8:24 AM      Result Value Range Status   Specimen Description BLOOD RIGHT HAND   Final   Special Requests BOTTLES DRAWN AEROBIC AND ANAEROBIC 6CC   Final   Culture NO GROWTH 2 DAYS   Final   Report Status PENDING   Incomplete  CULTURE, RESPIRATORY (NON-EXPECTORATED)     Status: None   Collection Time    11/29/12  2:37 PM      Result Value Range Status   Specimen Description TRACHEAL ASPIRATE   Final   Special Requests NONE   Final   Gram Stain     Final   Value: ABUNDANT WBC PRESENT, PREDOMINANTLY PMN     NO SQUAMOUS EPITHELIAL CELLS SEEN     FEW GRAM NEGATIVE RODS   Culture ABUNDANT GRAM NEGATIVE RODS   Final   Report Status PENDING   Incomplete   Studies/Results: Dg Chest Port 1 View  11/30/2012  *RADIOLOGY REPORT*  Clinical Data: Respiratory failure, on ventilator  PORTABLE CHEST - 1 VIEW  Comparison: Portable chest x-ray of 11/29/2012  Findings: The tip of the endotracheal tube is approximately 4.4 cm above the carina.  Bibasilar atelectasis remains.  Mild cardiomegaly is stable.  No bony abnormality is noted.  IMPRESSION: Endotracheal tip 4.4 cm above the carina.  Mild bibasilar atelectasis remains.   Original Report Authenticated By: Dwyane Dee, M.D.     Medications:  Prior to Admission:  Prescriptions  prior to admission  Medication Sig Dispense Refill  . acetaminophen (TYLENOL) 325 MG tablet Take 325 mg by mouth 4 (four) times daily as needed for pain (Taken with Oxycodone 10 mg as needed for pain).      Marland Kitchen albuterol (PROAIR  HFA) 108 (90 BASE) MCG/ACT inhaler Inhale 2 puffs into the lungs every 6 (six) hours as needed for wheezing or shortness of breath.      Marland Kitchen albuterol (PROVENTIL) (2.5 MG/3ML) 0.083% nebulizer solution Take 2.5 mg by nebulization every 6 (six) hours as needed. For shortness of breath      . budesonide-formoterol (SYMBICORT) 160-4.5 MCG/ACT inhaler Inhale 2 puffs into the lungs 2 (two) times daily.  1 Inhaler  0  . buPROPion (WELLBUTRIN XL) 150 MG 24 hr tablet Take 150 mg by mouth every morning.       . diazepam (VALIUM) 5 MG tablet Take 5 mg by mouth every 8 (eight) hours as needed. For anxiety      . diclofenac (VOLTAREN) 75 MG EC tablet Take 75 mg by mouth 2 (two) times daily.      . diclofenac sodium (VOLTAREN) 1 % GEL Apply 1 application topically 4 (four) times daily as needed. To knee for pain      . DULoxetine (CYMBALTA) 60 MG capsule Take 60 mg by mouth daily.        . fluticasone (FLONASE) 50 MCG/ACT nasal spray Place 2 sprays into the nose daily.      Marland Kitchen levalbuterol (XOPENEX HFA) 45 MCG/ACT inhaler Inhale 1-2 puffs into the lungs every 4 (four) hours as needed. For shortness of breath      . montelukast (SINGULAIR) 10 MG tablet Take 10 mg by mouth daily.       Marland Kitchen olopatadine (PATANOL) 0.1 % ophthalmic solution Place 1 drop into both eyes 2 (two) times daily.       . Oxycodone HCl 10 MG TABS Take 10 mg by mouth 4 (four) times daily as needed (for pain).      Marland Kitchen tiotropium (SPIRIVA) 18 MCG inhalation capsule Place 18 mcg into inhaler and inhale daily.      Marland Kitchen zolpidem (AMBIEN) 10 MG tablet Take 10 mg by mouth at bedtime as needed. For sleep       Scheduled: . budesonide-formoterol  2 puff Inhalation BID  . chlorhexidine  15 mL Mouth Rinse BID  . enoxaparin (LOVENOX)  injection  40 mg Subcutaneous Q24H  . ipratropium  0.5 mg Nebulization Q4H  . levalbuterol  0.63 mg Nebulization Q4H  . levofloxacin (LEVAQUIN) IV  750 mg Intravenous Q24H  . methylPREDNISolone (SOLU-MEDROL) injection  60 mg Intravenous Q6H  . mupirocin ointment  1 application Nasal BID  . nicotine  21 mg Transdermal Daily  . olopatadine  1 drop Both Eyes BID  . pantoprazole (PROTONIX) IV  40 mg Intravenous Daily  . sodium chloride  3 mL Intravenous Q12H  . vancomycin  1,000 mg Intravenous Q8H   Continuous: . sodium chloride 50 mL/hr at 12/01/12 0400  . propofol Stopped (11/30/12 0800)   ZOX:[id number], acetaminophen, fentaNYL, levalbuterol, midazolam, ondansetron (ZOFRAN) IV, ondansetron  Assesment: She had acute on chronic respiratory failure but has been extubated and seems to be doing okay. She has community-acquired pneumonia and severe COPD. Principal Problem:   COPD (chronic obstructive pulmonary disease) Active Problems:   Smoking   Chronic pain disorder   Metabolic encephalopathy   Acute-on-chronic respiratory failure   CAP (community acquired pneumonia)    Plan: Continue current treatments.    LOS: 4 days   Bethany Villa L 12/01/2012, 8:50 AM

## 2012-12-01 NOTE — Progress Notes (Signed)
Tylenol 650mg  po

## 2012-12-01 NOTE — Progress Notes (Signed)
[facility] PROGRESS NOTE  ABBAGAIL SCAFF LKG:[id number] DOB: 1955/09/25 DOA: 11/27/2012 PCP: Bennie Pierini, FNP Pulmonologist: Dr. Sherene Sires  Assessment: 1. Acute respiratory failure, respiratory acidosis: Much improved, stable status post extubation. Multifactorial: pneumonia, COPD exacerbation superimposed on chronic respiratory failure. Continue empiric abx. Wean oxygen as tolerated. Appreciate pulmonology evaluation and ongoing care. 2. COPD exacerbation: Improving. Continue IV steroids, antibiotics, nebulizers. 3. Acute encephalopathy: Resolved. Secondary to acute illness. 4. COPD, chronic respiratory failure on home oxygen: Plan as above. 5. Ongoing cigarette smoking: I have counseled cessation. 6. Fibromyalgia 7. Chronic pain: Stable. 8. Anxiety: Stable.  Plan: 1. Remain in ICU 2. Wean oxygen as tolerated. 3. Replace potassium 4. Resume oral medications, saline lock IV  5. Possible transfer to floor/5.  Code Status: Full code Family Communication: None bedside. Disposition Plan: Home when improved  Brendia Sacks, MD  [facility]  Pager [phone] If 7PM-7AM, please contact night-coverage at [email or web address], password [facility] 12/01/2012, 10:42 AM  LOS: 4 days   Brief narrative: 57 year old woman history of oxygen-dependent COPD presented with three-day history upper respiratory symptoms and increasing shortness of breath. She was admitted to the medical floor for COPD exacerbation. She became increasingly somnolent, ABG revealed acute respiratory acidosis patient was transferred to the ICU and placed on BiPAP. She failed to improve and was subsequently intubated 4/1. Pulmonology was consulted.  Consultants:  Pulmonology  Procedures:  Intubation 4/1 >> 4/3  Antibiotics:  Levofloxacin 3/31 >>  Vancomycin 4/2 >>  HPI/Subjective: Afebrile. Vital signs stable. No new problems overnight. Breathing continues to improve. Successfully  extubated.  Objective: Filed Vitals:   12/01/12 0839 12/01/12 0845 12/01/12 0900 12/01/12 0915  BP:   144/100   Pulse:  100 96 95  Temp:      TempSrc:      Resp:  24 27 22   Height:      Weight:      SpO2: 91% 97% 91% 91%    Intake/Output Summary (Last 24 hours) at 12/01/12 1042 Last data filed at 12/01/12 0900  Gross per 24 hour  Intake   2465 ml  Output   3675 ml  Net  -1210 ml   Filed Weights   11/29/12 0500 11/30/12 0500 12/01/12 0500  Weight: 106.9 kg (235 lb 10.8 oz) 110.7 kg (244 lb 0.8 oz) 110.8 kg (244 lb 4.3 oz)    Exam:  General:  Examined in the ICU. Appears comfortable, calm. Nontoxic. Speech fluent and clear. Eyes: Pupils appear normal. Lids and irises appear unremarkable. ENT: Grossly normal lips. Cardiovascular: RRR, no m/r/g. 1+ bilateral LE edema. Telemetry: SR, no arrhythmias  Respiratory: Improved air movement. Wheezes persist. No frank rhonchi or rales. Moderate increased respiratory effort. Musculoskeletal: Appears grossly unremarkable.  Psychiatric: Speech fluent and clear. Neurologic: Appears unremarkable.   Data Reviewed: Basic Metabolic Panel:  Recent Labs Lab 11/27/12 1534 11/28/12 0602 11/29/12 0505 11/30/12 0440 12/01/12 0504  NA 145 143 141 138 140  K 3.7 4.9 4.2 3.3* 3.2*  CL 101 102 98 95* 98  CO2 38* 39* 39* 35* 32  GLUCOSE 135* 128* 124* 126* 121*  BUN 5* 5* 16 15 15   CREATININE 0.50 0.44* 0.62 0.55 0.49*  CALCIUM 8.9 8.8 8.9 8.6 8.7   CBC:  Recent Labs Lab 11/27/12 1534 11/28/12 0602 11/29/12 0505 11/30/12 0440 12/01/12 0504  WBC 11.2* 7.1 6.3 7.0 5.1  NEUTROABS 9.0*  --   --   --   --   HGB 14.2 13.9 13.4 13.5 13.7  HCT  46.7* 48.3* 46.5* 43.6 43.9  MCV 94.7 96.8 96.5 90.8 89.2  PLT 202 212 194 168 169   Cardiac Enzymes:  Recent Labs Lab 11/27/12 1534  TROPONINI <0.30   BNP (last 3 results)  Recent Labs  11/27/12 1534  PROBNP 228.4*   CBG:  Recent Labs Lab 11/30/12 1139 11/30/12 1622  11/30/12 2052 12/01/12 0017 12/01/12 0801  GLUCAP 144* 112* 116* 99 122*   ABG    Component Value Date/Time   PHART 7.448 11/30/2012 0833   PCO2ART 55.3* 11/30/2012 0833   PO2ART 60.4* 11/30/2012 0833   HCO3 37.7* 11/30/2012 0833   TCO2 33.3 11/30/2012 0833   ACIDBASEDEF 11.7* 11/30/2012 0833   O2SAT 91.4 11/30/2012 0833     Recent Results (from the past 240 hour(s))  MRSA PCR SCREENING     Status: Abnormal   Collection Time    11/28/12  2:08 AM      Result Value Range Status   MRSA by PCR POSITIVE (*) NEGATIVE Final   Comment:            The GeneXpert MRSA Assay (FDA     approved for NASAL specimens     only), is one component of a     comprehensive MRSA colonization     surveillance program. It is not     intended to diagnose MRSA     infection nor to guide or     monitor treatment for     MRSA infections.     RESULT CALLED TO, READ BACK BY AND VERIFIED WITH:     WILSON A AT 0400 ON [id number] BY FORSYTH K  URINE CULTURE     Status: None   Collection Time    11/28/12  4:20 PM      Result Value Range Status   Specimen Description URINE, CATHETERIZED   Final   Special Requests NONE   Final   Culture  Setup Time 11/28/2012 18:45   Final   Colony Count NO GROWTH   Final   Culture NO GROWTH   Final   Report Status 11/29/2012 FINAL   Final  CULTURE, BLOOD (ROUTINE X 2)     Status: None   Collection Time    11/29/12  8:24 AM      Result Value Range Status   Specimen Description BLOOD RIGHT WRIST   Final   Special Requests BOTTLES DRAWN AEROBIC ONLY 4CC   Final   Culture NO GROWTH 2 DAYS   Final   Report Status PENDING   Incomplete  CULTURE, BLOOD (ROUTINE X 2)     Status: None   Collection Time    11/29/12  8:24 AM      Result Value Range Status   Specimen Description BLOOD RIGHT HAND   Final   Special Requests BOTTLES DRAWN AEROBIC AND ANAEROBIC 6CC   Final   Culture NO GROWTH 2 DAYS   Final   Report Status PENDING   Incomplete  CULTURE, RESPIRATORY (NON-EXPECTORATED)      Status: None   Collection Time    11/29/12  2:37 PM      Result Value Range Status   Specimen Description TRACHEAL ASPIRATE   Final   Special Requests NONE   Final   Gram Stain     Final   Value: ABUNDANT WBC PRESENT, PREDOMINANTLY PMN     NO SQUAMOUS EPITHELIAL CELLS SEEN     FEW GRAM NEGATIVE RODS   Culture ABUNDANT GRAM NEGATIVE RODS  Final   Report Status PENDING   Incomplete     Studies: Dg Chest Port 1 View  11/30/2012  *RADIOLOGY REPORT*  Clinical Data: Respiratory failure, on ventilator  PORTABLE CHEST - 1 VIEW  Comparison: Portable chest x-ray of 11/29/2012  Findings: The tip of the endotracheal tube is approximately 4.4 cm above the carina.  Bibasilar atelectasis remains.  Mild cardiomegaly is stable.  No bony abnormality is noted.  IMPRESSION: Endotracheal tip 4.4 cm above the carina.  Mild bibasilar atelectasis remains.   Original Report Authenticated By: Dwyane Dee, M.D.     Scheduled Meds: . budesonide-formoterol  2 puff Inhalation BID  . chlorhexidine  15 mL Mouth Rinse BID  . enoxaparin (LOVENOX) injection  40 mg Subcutaneous Q24H  . ipratropium  0.5 mg Nebulization Q4H  . levalbuterol  0.63 mg Nebulization Q4H  . levofloxacin (LEVAQUIN) IV  750 mg Intravenous Q24H  . methylPREDNISolone (SOLU-MEDROL) injection  60 mg Intravenous Q6H  . mupirocin ointment  1 application Nasal BID  . nicotine  21 mg Transdermal Daily  . olopatadine  1 drop Both Eyes BID  . pantoprazole (PROTONIX) IV  40 mg Intravenous Daily  . sodium chloride  3 mL Intravenous Q12H  . vancomycin  1,000 mg Intravenous Q8H   Continuous Infusions: . sodium chloride 50 mL/hr at 12/01/12 5784    Principal Problem:   COPD (chronic obstructive pulmonary disease) Active Problems:   Smoking   Chronic pain disorder   Metabolic encephalopathy   Acute-on-chronic respiratory failure   CAP (community acquired pneumonia)    Brendia Sacks, MD  [facility] Pager [phone] If 7PM-7AM, please  contact night-coverage at [email or web address], password [facility] 12/01/2012, 10:42 AM  LOS: 4 days   Time spent: 20 minutes

## 2012-12-02 DIAGNOSIS — J9600 Acute respiratory failure, unspecified whether with hypoxia or hypercapnia: Secondary | ICD-10-CM | POA: Diagnosis not present

## 2012-12-02 LAB — CULTURE, RESPIRATORY W GRAM STAIN

## 2012-12-02 MED ORDER — METHYLPREDNISOLONE SODIUM SUCC 125 MG IJ SOLR
60.0000 mg | Freq: Two times a day (BID) | INTRAMUSCULAR | Status: DC
  Administered 2012-12-02 – 2012-12-04 (×4): 60 mg via INTRAVENOUS
  Filled 2012-12-02 (×4): qty 2

## 2012-12-02 MED ORDER — POTASSIUM CHLORIDE CRYS ER 20 MEQ PO TBCR
40.0000 meq | EXTENDED_RELEASE_TABLET | Freq: Two times a day (BID) | ORAL | Status: AC
  Administered 2012-12-02 (×2): 40 meq via ORAL
  Filled 2012-12-02 (×2): qty 2

## 2012-12-02 MED ORDER — DEXTROSE 5 % IV SOLN
INTRAVENOUS | Status: AC
  Filled 2012-12-02 (×2): qty 1

## 2012-12-02 MED ORDER — PANTOPRAZOLE SODIUM 40 MG PO TBEC
40.0000 mg | DELAYED_RELEASE_TABLET | Freq: Every day | ORAL | Status: DC
  Administered 2012-12-03 – 2012-12-06 (×4): 40 mg via ORAL
  Filled 2012-12-02 (×4): qty 1

## 2012-12-02 MED ORDER — DEXTROSE 5 % IV SOLN
1.0000 g | Freq: Three times a day (TID) | INTRAVENOUS | Status: DC
  Administered 2012-12-02 – 2012-12-06 (×12): 1 g via INTRAVENOUS
  Filled 2012-12-02 (×15): qty 1

## 2012-12-02 MED ORDER — OXYCODONE HCL 5 MG PO TABS
10.0000 mg | ORAL_TABLET | Freq: Four times a day (QID) | ORAL | Status: DC | PRN
  Administered 2012-12-02 – 2012-12-06 (×16): 10 mg via ORAL
  Filled 2012-12-02 (×17): qty 2

## 2012-12-02 MED ORDER — SALINE SPRAY 0.65 % NA SOLN
1.0000 | NASAL | Status: DC | PRN
  Administered 2012-12-02: 1 via NASAL
  Filled 2012-12-02: qty 44

## 2012-12-02 MED ORDER — LEVOFLOXACIN 750 MG PO TABS: 750.0000 mg | ORAL_TABLET | Freq: Every day | ORAL | Status: DC

## 2012-12-02 MED ORDER — SALINE SPRAY 0.65 % NA SOLN
NASAL | Status: AC
  Filled 2012-12-02: qty 44

## 2012-12-02 NOTE — Progress Notes (Signed)
Subjective: She looks very good. She has no complaints. Her breathing is better. She is still coughing a little bit  Objective: Vital signs in last 24 hours: Temp:  [98 F (36.7 C)-98.4 F (36.9 C)] 98.2 F (36.8 C) (04/05 0800) Pulse Rate:  [81-104] 95 (04/05 0800) Resp:  [15-31] 21 (04/05 0800) BP: (126-162)/(66-112) 162/107 mmHg (04/05 0800) SpO2:  [85 %-100 %] 98 % (04/05 0800) Weight:  [106.4 kg (234 lb 9.1 oz)] 106.4 kg (234 lb 9.1 oz) (04/05 0600) Weight change: -4.4 kg (-9 lb 11.2 oz) Last BM Date: 11/27/12  Intake/Output from previous day: 04/04 0701 - 04/05 0700 In: 3180 [P.O.:2280; I.V.:150; IV Piggyback:750] Out: 500 [Urine:500]  PHYSICAL EXAM General appearance: alert, cooperative and mild distress Resp: rhonchi bilaterally Cardio: regular rate and rhythm, S1, S2 normal, no murmur, click, rub or gallop GI: soft, non-tender; bowel sounds normal; no masses,  no organomegaly Extremities: extremities normal, atraumatic, no cyanosis or edema  Lab Results:    Basic Metabolic Panel:  Recent Labs  16/10/96 0440 12/01/12 0504  NA 138 140  K 3.3* 3.2*  CL 95* 98  CO2 35* 32  GLUCOSE 126* 121*  BUN 15 15  CREATININE 0.55 0.49*  CALCIUM 8.6 8.7   Liver Function Tests: No results found for this basename: AST, ALT, ALKPHOS, BILITOT, PROT, ALBUMIN,  in the last 72 hours No results found for this basename: LIPASE, AMYLASE,  in the last 72 hours No results found for this basename: AMMONIA,  in the last 72 hours CBC:  Recent Labs  11/30/12 0440 12/01/12 0504  WBC 7.0 5.1  HGB 13.5 13.7  HCT 43.6 43.9  MCV 90.8 89.2  PLT 168 169   Cardiac Enzymes: No results found for this basename: CKTOTAL, CKMB, CKMBINDEX, TROPONINI,  in the last 72 hours BNP: No results found for this basename: PROBNP,  in the last 72 hours D-Dimer: No results found for this basename: DDIMER,  in the last 72 hours CBG:  Recent Labs  11/30/12 1622 11/30/12 2052 12/01/12 0017  12/01/12 0801 12/01/12 1140 12/01/12 1625  GLUCAP 112* 116* 99 122* 163* 120*   Hemoglobin A1C: No results found for this basename: HGBA1C,  in the last 72 hours Fasting Lipid Panel: No results found for this basename: CHOL, HDL, LDLCALC, TRIG, CHOLHDL, LDLDIRECT,  in the last 72 hours Thyroid Function Tests: No results found for this basename: TSH, T4TOTAL, FREET4, T3FREE, THYROIDAB,  in the last 72 hours Anemia Panel: No results found for this basename: VITAMINB12, FOLATE, FERRITIN, TIBC, IRON, RETICCTPCT,  in the last 72 hours Coagulation: No results found for this basename: LABPROT, INR,  in the last 72 hours Urine Drug Screen: Drugs of Abuse  No results found for this basename: labopia, cocainscrnur, labbenz, amphetmu, thcu, labbarb    Alcohol Level: No results found for this basename: ETH,  in the last 72 hours Urinalysis: No results found for this basename: COLORURINE, APPERANCEUR, LABSPEC, PHURINE, GLUCOSEU, HGBUR, BILIRUBINUR, KETONESUR, PROTEINUR, UROBILINOGEN, NITRITE, LEUKOCYTESUR,  in the last 72 hours Misc. Labs:  ABGS  Recent Labs  11/30/12 0833  PHART 7.448  PO2ART 60.4*  TCO2 33.3  HCO3 37.7*   CULTURES Recent Results (from the past 240 hour(s))  MRSA PCR SCREENING     Status: Abnormal   Collection Time    11/28/12  2:08 AM      Result Value Range Status   MRSA by PCR POSITIVE (*) NEGATIVE Final   Comment:  The GeneXpert MRSA Assay (FDA     approved for NASAL specimens     only), is one component of a     comprehensive MRSA colonization     surveillance program. It is not     intended to diagnose MRSA     infection nor to guide or     monitor treatment for     MRSA infections.     RESULT CALLED TO, READ BACK BY AND VERIFIED WITH:     WILSON A AT 0400 ON [id number] BY FORSYTH K  URINE CULTURE     Status: None   Collection Time    11/28/12  4:20 PM      Result Value Range Status   Specimen Description URINE, CATHETERIZED   Final    Special Requests NONE   Final   Culture  Setup Time 11/28/2012 18:45   Final   Colony Count NO GROWTH   Final   Culture NO GROWTH   Final   Report Status 11/29/2012 FINAL   Final  CULTURE, BLOOD (ROUTINE X 2)     Status: None   Collection Time    11/29/12  8:24 AM      Result Value Range Status   Specimen Description BLOOD RIGHT WRIST   Final   Special Requests BOTTLES DRAWN AEROBIC ONLY 4CC   Final   Culture NO GROWTH 2 DAYS   Final   Report Status PENDING   Incomplete  CULTURE, BLOOD (ROUTINE X 2)     Status: None   Collection Time    11/29/12  8:24 AM      Result Value Range Status   Specimen Description BLOOD RIGHT HAND   Final   Special Requests BOTTLES DRAWN AEROBIC AND ANAEROBIC 6CC   Final   Culture NO GROWTH 2 DAYS   Final   Report Status PENDING   Incomplete  CULTURE, RESPIRATORY (NON-EXPECTORATED)     Status: None   Collection Time    11/29/12  2:37 PM      Result Value Range Status   Specimen Description TRACHEAL ASPIRATE   Final   Special Requests NONE   Final   Gram Stain     Final   Value: ABUNDANT WBC PRESENT, PREDOMINANTLY PMN     NO SQUAMOUS EPITHELIAL CELLS SEEN     FEW GRAM NEGATIVE RODS   Culture ABUNDANT GRAM NEGATIVE RODS   Final   Report Status PENDING   Incomplete   Studies/Results: No results found.  Medications:  Prior to Admission:  Prescriptions prior to admission  Medication Sig Dispense Refill  . acetaminophen (TYLENOL) 325 MG tablet Take 325 mg by mouth 4 (four) times daily as needed for pain (Taken with Oxycodone 10 mg as needed for pain).      Marland Kitchen albuterol (PROAIR HFA) 108 (90 BASE) MCG/ACT inhaler Inhale 2 puffs into the lungs every 6 (six) hours as needed for wheezing or shortness of breath.      Marland Kitchen albuterol (PROVENTIL) (2.5 MG/3ML) 0.083% nebulizer solution Take 2.5 mg by nebulization every 6 (six) hours as needed. For shortness of breath      . budesonide-formoterol (SYMBICORT) 160-4.5 MCG/ACT inhaler Inhale 2 puffs into the lungs 2  (two) times daily.  1 Inhaler  0  . buPROPion (WELLBUTRIN XL) 150 MG 24 hr tablet Take 150 mg by mouth every morning.       . diazepam (VALIUM) 5 MG tablet Take 5 mg by mouth every 8 (eight) hours as  needed. For anxiety      . diclofenac (VOLTAREN) 75 MG EC tablet Take 75 mg by mouth 2 (two) times daily.      . diclofenac sodium (VOLTAREN) 1 % GEL Apply 1 application topically 4 (four) times daily as needed. To knee for pain      . DULoxetine (CYMBALTA) 60 MG capsule Take 60 mg by mouth daily.        . fluticasone (FLONASE) 50 MCG/ACT nasal spray Place 2 sprays into the nose daily.      Marland Kitchen levalbuterol (XOPENEX HFA) 45 MCG/ACT inhaler Inhale 1-2 puffs into the lungs every 4 (four) hours as needed. For shortness of breath      . montelukast (SINGULAIR) 10 MG tablet Take 10 mg by mouth daily.       Marland Kitchen olopatadine (PATANOL) 0.1 % ophthalmic solution Place 1 drop into both eyes 2 (two) times daily.       . Oxycodone HCl 10 MG TABS Take 10 mg by mouth 4 (four) times daily as needed (for pain).      Marland Kitchen tiotropium (SPIRIVA) 18 MCG inhalation capsule Place 18 mcg into inhaler and inhale daily.      Marland Kitchen zolpidem (AMBIEN) 10 MG tablet Take 10 mg by mouth at bedtime as needed. For sleep       Scheduled: . budesonide-formoterol  2 puff Inhalation BID  . buPROPion  150 mg Oral Daily  . DULoxetine  60 mg Oral Daily  . enoxaparin (LOVENOX) injection  40 mg Subcutaneous Q24H  . ipratropium  0.5 mg Nebulization Q4H  . levalbuterol  0.63 mg Nebulization Q4H  . [START ON 12/03/2012] levofloxacin  750 mg Oral Daily  . methylPREDNISolone (SOLU-MEDROL) injection  60 mg Intravenous Q6H  . montelukast  10 mg Oral Daily  . mupirocin ointment  1 application Nasal BID  . nicotine  21 mg Transdermal Daily  . olopatadine  1 drop Both Eyes BID  . [START ON 12/03/2012] pantoprazole  40 mg Oral Daily  . sodium chloride  3 mL Intravenous Q12H  . vancomycin  1,000 mg Intravenous Q8H   Continuous:  NFA:[id number],  acetaminophen, fentaNYL, levalbuterol, ondansetron (ZOFRAN) IV, ondansetron, zolpidem  Assesment: She was admitted with COPD exacerbation and community-acquired pneumonia. She has acute on chronic respiratory failure. She seems to be improving. Principal Problem:   COPD (chronic obstructive pulmonary disease) Active Problems:   Smoking   Chronic pain disorder   Metabolic encephalopathy   Acute-on-chronic respiratory failure   CAP (community acquired pneumonia)    Plan: Continue current treatments. I would continue to taper steroids.    LOS: 5 days   Bethany Villa L 12/02/2012, 9:25 AM

## 2012-12-02 NOTE — Progress Notes (Signed)
[facility] PROGRESS NOTE  KAILI CASTILLE ZOX:[id number] DOB: 13-Feb-1956 DOA: 11/27/2012 PCP: Bennie Pierini, FNP Pulmonologist: Dr. Sherene Sires  Assessment: 1. Acute respiratory failure, respiratory acidosis: Continues to improve status post extubation. Multifactorial: pneumonia, COPD exacerbation superimposed on chronic respiratory failure. Continue empiric abx pending final culture. Wean oxygen as tolerated. Appreciate pulmonology evaluation and ongoing care. 2. [facility] pneumonia: Blood cultures no growth to date. Respiratory culture growing gram-negative rods. Given clinical improvement, lack of fever and a normal white blood cell count we will not make any changes to antibiotics at this point. Continue Levaquin. May be able to discontinue vancomycin 4/6. 3. COPD exacerbation: Improving. Decreased steroids. Continue oxygen nebulizers. Wean oxygen to 2.5 L. 4. Acute encephalopathy: Resolved. Secondary to acute illness. 5. COPD, chronic respiratory failure on home oxygen: Plan as above. 6. Ongoing cigarette smoking: I have counseled cessation. 7. Fibromyalgia 8. Chronic pain: Stable. 9. Anxiety: Stable.  Plan: 1. Transfer to medical bed 2. Wean oxygen as tolerated to chronic 2.5 L 3. Replace potassium   Code Status: Full code Family Communication: None bedside. Disposition Plan: Home when improved  Brendia Sacks, MD  [facility]  Pager [phone] If 7PM-7AM, please contact night-coverage at [email or web address], password [facility] 12/02/2012, 9:20 AM  LOS: 5 days   Brief narrative: 57 year old woman history of oxygen-dependent COPD presented with three-day history upper respiratory symptoms and increasing shortness of breath. She was admitted to the medical floor for COPD exacerbation. She became increasingly somnolent, ABG revealed acute respiratory acidosis patient was transferred to the ICU and placed on BiPAP. She failed to improve and was subsequently intubated 4/1.  Pulmonology was consulted.  Consultants:  Pulmonology  Procedures:  Intubation 4/1 >> 4/3  Antibiotics:  Levofloxacin 3/31 >>  Vancomycin 4/2 >>  HPI/Subjective: Had a good night. Breathing improving. No new issues. Patient has no new complaints. Discussed with RN--no concerns. Felt to be ready for transfer to medical bed.  Objective: Filed Vitals:   12/02/12 0700 12/02/12 0730 12/02/12 0751 12/02/12 0800  BP: 155/90 157/93  162/107  Pulse: 81 92  95  Temp:    98.2 F (36.8 C)  TempSrc:    Axillary  Resp: 18 20  21   Height:      Weight:      SpO2: 97% 97% 93% 98%    Intake/Output Summary (Last 24 hours) at 12/02/12 0920 Last data filed at 12/02/12 0800  Gross per 24 hour  Intake   2720 ml  Output    900 ml  Net   1820 ml   Filed Weights   11/30/12 0500 12/01/12 0500 12/02/12 0600  Weight: 110.7 kg (244 lb 0.8 oz) 110.8 kg (244 lb 4.3 oz) 106.4 kg (234 lb 9.1 oz)    Exam:  General:  Examined in the ICU. Appears comfortable, calm. Better today. Cardiovascular: RRR, no m/r/g. 1+ bilateral LE edema. Respiratory: Somewhat improved air movement. Decreased respiratory effort. Few wheezes. No rhonchi or rales. Musculoskeletal: Grossly unremarkable. Psychiatric: Speech fluent and clear.  Data Reviewed: Basic Metabolic Panel:  Recent Labs Lab 11/27/12 1534 11/28/12 0602 11/29/12 0505 11/30/12 0440 12/01/12 0504  NA 145 143 141 138 140  K 3.7 4.9 4.2 3.3* 3.2*  CL 101 102 98 95* 98  CO2 38* 39* 39* 35* 32  GLUCOSE 135* 128* 124* 126* 121*  BUN 5* 5* 16 15 15   CREATININE 0.50 0.44* 0.62 0.55 0.49*  CALCIUM 8.9 8.8 8.9 8.6 8.7   CBC:  Recent Labs Lab 11/27/12 1534 11/28/12  0602 11/29/12 0505 11/30/12 0440 12/01/12 0504  WBC 11.2* 7.1 6.3 7.0 5.1  NEUTROABS 9.0*  --   --   --   --   HGB 14.2 13.9 13.4 13.5 13.7  HCT 46.7* 48.3* 46.5* 43.6 43.9  MCV 94.7 96.8 96.5 90.8 89.2  PLT 202 212 194 168 169   Cardiac Enzymes:  Recent Labs Lab  11/27/12 1534  TROPONINI <0.30   BNP (last 3 results)  Recent Labs  11/27/12 1534  PROBNP 228.4*   CBG:  Recent Labs Lab 11/30/12 2052 12/01/12 0017 12/01/12 0801 12/01/12 1140 12/01/12 1625  GLUCAP 116* 99 122* 163* 120*   ABG    Component Value Date/Time   PHART 7.448 11/30/2012 0833   PCO2ART 55.3* 11/30/2012 0833   PO2ART 60.4* 11/30/2012 0833   HCO3 37.7* 11/30/2012 0833   TCO2 33.3 11/30/2012 0833   ACIDBASEDEF 11.7* 11/30/2012 0833   O2SAT 91.4 11/30/2012 0833     Recent Results (from the past 240 hour(s))  MRSA PCR SCREENING     Status: Abnormal   Collection Time    11/28/12  2:08 AM      Result Value Range Status   MRSA by PCR POSITIVE (*) NEGATIVE Final   Comment:            The GeneXpert MRSA Assay (FDA     approved for NASAL specimens     only), is one component of a     comprehensive MRSA colonization     surveillance program. It is not     intended to diagnose MRSA     infection nor to guide or     monitor treatment for     MRSA infections.     RESULT CALLED TO, READ BACK BY AND VERIFIED WITH:     WILSON A AT 0400 ON [id number] BY FORSYTH K  URINE CULTURE     Status: None   Collection Time    11/28/12  4:20 PM      Result Value Range Status   Specimen Description URINE, CATHETERIZED   Final   Special Requests NONE   Final   Culture  Setup Time 11/28/2012 18:45   Final   Colony Count NO GROWTH   Final   Culture NO GROWTH   Final   Report Status 11/29/2012 FINAL   Final  CULTURE, BLOOD (ROUTINE X 2)     Status: None   Collection Time    11/29/12  8:24 AM      Result Value Range Status   Specimen Description BLOOD RIGHT WRIST   Final   Special Requests BOTTLES DRAWN AEROBIC ONLY 4CC   Final   Culture NO GROWTH 2 DAYS   Final   Report Status PENDING   Incomplete  CULTURE, BLOOD (ROUTINE X 2)     Status: None   Collection Time    11/29/12  8:24 AM      Result Value Range Status   Specimen Description BLOOD RIGHT HAND   Final   Special Requests  BOTTLES DRAWN AEROBIC AND ANAEROBIC 6CC   Final   Culture NO GROWTH 2 DAYS   Final   Report Status PENDING   Incomplete  CULTURE, RESPIRATORY (NON-EXPECTORATED)     Status: None   Collection Time    11/29/12  2:37 PM      Result Value Range Status   Specimen Description TRACHEAL ASPIRATE   Final   Special Requests NONE   Final   Gram Stain  Final   Value: ABUNDANT WBC PRESENT, PREDOMINANTLY PMN     NO SQUAMOUS EPITHELIAL CELLS SEEN     FEW GRAM NEGATIVE RODS   Culture ABUNDANT GRAM NEGATIVE RODS   Final   Report Status PENDING   Incomplete     Studies: No results found.  Scheduled Meds: . budesonide-formoterol  2 puff Inhalation BID  . buPROPion  150 mg Oral Daily  . DULoxetine  60 mg Oral Daily  . enoxaparin (LOVENOX) injection  40 mg Subcutaneous Q24H  . ipratropium  0.5 mg Nebulization Q4H  . levalbuterol  0.63 mg Nebulization Q4H  . [START ON 12/03/2012] levofloxacin  750 mg Oral Daily  . methylPREDNISolone (SOLU-MEDROL) injection  60 mg Intravenous Q6H  . montelukast  10 mg Oral Daily  . mupirocin ointment  1 application Nasal BID  . nicotine  21 mg Transdermal Daily  . olopatadine  1 drop Both Eyes BID  . [START ON 12/03/2012] pantoprazole  40 mg Oral Daily  . sodium chloride  3 mL Intravenous Q12H  . vancomycin  1,000 mg Intravenous Q8H   Continuous Infusions:    Principal Problem:   COPD (chronic obstructive pulmonary disease) Active Problems:   Smoking   Chronic pain disorder   Metabolic encephalopathy   Acute-on-chronic respiratory failure   CAP (community acquired pneumonia)    Brendia Sacks, MD  [facility] Pager [phone] If 7PM-7AM, please contact night-coverage at [email or web address], password [facility] 12/02/2012, 9:20 AM  LOS: 5 days   Time spent: 20 minutes

## 2012-12-02 NOTE — Progress Notes (Signed)
Pt wheezes after treatment runs, diminished before.

## 2012-12-02 NOTE — Progress Notes (Signed)
PT BEING TRANSFERRED TO ROOM 338. TRANSFER REPORT CALLED TO MIRAND SURLES RN ON 300. PT ALERT AND ORIENTED. O2 AT 4L/MIN VIA [place]. O2 SAT 96%.DIMINISHED BREATH SOUNDS. IV SITES X2 PATENT. PT VOIDING QUANITY SUFFICIENT. DENIES ANY DISCOMFORT. PT WILL BE TRANSFER AFTER SHE FINISHES EATING LUNCH.

## 2012-12-02 NOTE — Progress Notes (Signed)
ANTIBIOTIC CONSULT NOTE   Pharmacy Consult for Vancomycin / Aztreonam Indication: PNA  Allergies  Allergen Reactions  . Penicillins Shortness Of Breath and Swelling  . Sulfa Antibiotics Shortness Of Breath and Swelling  . Mobic (Meloxicam)    Patient Measurements: Height: 5\' 3"  (160 cm) Weight: 234 lb 9.1 oz (106.4 kg) IBW/kg (Calculated) : 52.4  Vital Signs: Temp: 98.2 F (36.8 C) (04/05 0800) Temp src: Axillary (04/05 0800) BP: 154/99 mmHg (04/05 1000) Pulse Rate: 96 (04/05 1300) Intake/Output from previous day: 04/04 0701 - 04/05 0700 In: 3180 [P.O.:2280; I.V.:150; IV Piggyback:750] Out: 500 [Urine:500] Intake/Output from this shift: Total I/O In: 830 [P.O.:480; IV Piggyback:350] Out: 400 [Urine:400]  Labs:  Recent Labs  11/30/12 0440 12/01/12 0504  WBC 7.0 5.1  HGB 13.5 13.7  PLT 168 169  CREATININE 0.55 0.49*   Estimated Creatinine Clearance: 92.8 ml/min (by C-G formula based on Cr of 0.49).  Recent Labs  11/30/12 0911  VANCOTROUGH 13.8    Microbiology: Recent Results (from the past 720 hour(s))  MRSA PCR SCREENING     Status: Abnormal   Collection Time    11/28/12  2:08 AM      Result Value Range Status   MRSA by PCR POSITIVE (*) NEGATIVE Final   Comment:            The GeneXpert MRSA Assay (FDA     approved for NASAL specimens     only), is one component of a     comprehensive MRSA colonization     surveillance program. It is not     intended to diagnose MRSA     infection nor to guide or     monitor treatment for     MRSA infections.     RESULT CALLED TO, READ BACK BY AND VERIFIED WITH:     WILSON A AT 0400 ON [id number] BY FORSYTH K  URINE CULTURE     Status: None   Collection Time    11/28/12  4:20 PM      Result Value Range Status   Specimen Description URINE, CATHETERIZED   Final   Special Requests NONE   Final   Culture  Setup Time 11/28/2012 18:45   Final   Colony Count NO GROWTH   Final   Culture NO GROWTH   Final   Report Status  11/29/2012 FINAL   Final  CULTURE, BLOOD (ROUTINE X 2)     Status: None   Collection Time    11/29/12  8:24 AM      Result Value Range Status   Specimen Description BLOOD RIGHT WRIST   Final   Special Requests BOTTLES DRAWN AEROBIC ONLY 4CC   Final   Culture NO GROWTH 3 DAYS   Final   Report Status PENDING   Incomplete  CULTURE, BLOOD (ROUTINE X 2)     Status: None   Collection Time    11/29/12  8:24 AM      Result Value Range Status   Specimen Description BLOOD RIGHT HAND   Final   Special Requests BOTTLES DRAWN AEROBIC AND ANAEROBIC 6CC   Final   Culture NO GROWTH 3 DAYS   Final   Report Status PENDING   Incomplete  CULTURE, RESPIRATORY (NON-EXPECTORATED)     Status: None   Collection Time    11/29/12  2:37 PM      Result Value Range Status   Specimen Description TRACHEAL ASPIRATE   Final   Special Requests NONE  Final   Gram Stain     Final   Value: ABUNDANT WBC PRESENT, PREDOMINANTLY PMN     NO SQUAMOUS EPITHELIAL CELLS SEEN     FEW GRAM NEGATIVE RODS   Culture ABUNDANT ESCHERICHIA COLI   Final   Report Status 12/02/2012 FINAL   Final   Organism ID, Bacteria ESCHERICHIA COLI   Final   Medical History: Past Medical History  Diagnosis Date  . Fibromyalgia   . Asthma   . DJD (degenerative joint disease)   . Chronic pain   . Depression   . Anxiety   . Panic attack   . Sleep disorder   . DJD (degenerative joint disease)    Medications:  Scheduled:  . aztreonam  1 g Intravenous Q8H  . budesonide-formoterol  2 puff Inhalation BID  . buPROPion  150 mg Oral Daily  . DULoxetine  60 mg Oral Daily  . enoxaparin (LOVENOX) injection  40 mg Subcutaneous Q24H  . ipratropium  0.5 mg Nebulization Q4H  . levalbuterol  0.63 mg Nebulization Q4H  . methylPREDNISolone (SOLU-MEDROL) injection  60 mg Intravenous Q12H  . montelukast  10 mg Oral Daily  . mupirocin ointment  1 application Nasal BID  . nicotine  21 mg Transdermal Daily  . olopatadine  1 drop Both Eyes BID  . [START  ON 12/03/2012] pantoprazole  40 mg Oral Daily  . potassium chloride  40 mEq Oral BID  . sodium chloride  3 mL Intravenous Q12H  . vancomycin  1,000 mg Intravenous Q8H  . [DISCONTINUED] chlorhexidine  15 mL Mouth Rinse BID  . [DISCONTINUED] levofloxacin (LEVAQUIN) IV  750 mg Intravenous Q24H  . [DISCONTINUED] levofloxacin  750 mg Oral Daily  . [DISCONTINUED] methylPREDNISolone (SOLU-MEDROL) injection  60 mg Intravenous Q6H  . [DISCONTINUED] pantoprazole (PROTONIX) IV  40 mg Intravenous Daily   Assessment: 56yo obese female with good renal fxn.  Estimated Creatinine Clearance: 92.8 ml/min (by C-G formula based on Cr of 0.49). Pt c/o persistent cough and SOB.  Pt is afebrile now and no leukocytosis.   E.Coli sputum culture resistant to Cipro (was on Levaquin). Significant PCN allergy noted.  Antibiotics:  Levofloxacin 3/31 >> 4/5 Vancomycin 4/2 >> Aztreonam 4/5 >>  Goal of Therapy:  Vancomycin trough level 15-20 mcg/ml Eradicate infection.  Plan:  Continue Vancomycin 1gm IV q8hrs Add Aztreonam 1gm IV every 8 hours. Monitor labs, renal fxn, and cultures Duration of therapy per MD  Mady Gemma 12/02/2012,4:14 PM

## 2012-12-02 NOTE — Progress Notes (Signed)
PHARMACIST - PHYSICIAN COMMUNICATION DR:   [facility] CONCERNING: Antibiotic IV to Oral Route Change Policy  RECOMMENDATION: This patient is receiving Levaquin by the intravenous route.  Based on criteria approved by the Pharmacy and Therapeutics Committee, the antibiotic(s) is/are being converted to the equivalent oral dose form(s).   DESCRIPTION: These criteria include:  Patient being treated for a respiratory tract infection, urinary tract infection, or cellulitis  The patient is not neutropenic and does not exhibit a GI malabsorption state  The patient is eating (either orally or via tube) and/or has been taking other orally administered medications for a least 24 hours  The patient is improving clinically and has a Tmax < 100.5  If you have questions about this conversion, please contact the Pharmacy Department  [x]   [phone] )  [facility] []   [phone] )  [facility]  []   [phone] )  [facility] []   [phone] )  [facility]   The patient is receiving Protonix by the intravenous route.  Based on criteria approved by the Pharmacy and Therapeutics Committee and the Medical Executive Committee, the medication is being converted to the equivalent oral dose form.  These criteria include: -No Active GI bleeding -Able to tolerate diet of full liquids (or better) or tube feeding OR able to tolerate other medications by the oral or enteral route  If you have any questions about this conversion, please contact the Pharmacy Department (ext [phone]).  Thank you.  Mady Gemma, [facility] 12/02/2012 9:12 AM

## 2012-12-03 DIAGNOSIS — F17200 Nicotine dependence, unspecified, uncomplicated: Secondary | ICD-10-CM | POA: Diagnosis not present

## 2012-12-03 DIAGNOSIS — J9620 Acute and chronic respiratory failure, unspecified whether with hypoxia or hypercapnia: Secondary | ICD-10-CM | POA: Diagnosis not present

## 2012-12-03 DIAGNOSIS — J155 Pneumonia due to Escherichia coli: Secondary | ICD-10-CM

## 2012-12-03 DIAGNOSIS — J449 Chronic obstructive pulmonary disease, unspecified: Secondary | ICD-10-CM | POA: Diagnosis not present

## 2012-12-03 NOTE — Progress Notes (Signed)
Subjective: She feels well and has no complaints. Her breathing is doing better.  Objective: Vital signs in last 24 hours: Temp:  [97.5 F (36.4 C)-98.4 F (36.9 C)] 97.6 F (36.4 C) (04/06 0457) Pulse Rate:  [85-101] 85 (04/06 0457) Resp:  [16-30] 16 (04/06 0457) BP: (130-162)/(69-107) 130/85 mmHg (04/06 0457) SpO2:  [90 %-100 %] 95 % (04/06 0457) Weight change:  Last BM Date: 11/27/12  Intake/Output from previous day: 04/05 0701 - 04/06 0700 In: 1070 [P.O.:720; IV Piggyback:350] Out: 700 [Urine:700]  PHYSICAL EXAM General appearance: alert, cooperative and mild distress Resp: rhonchi bilaterally Cardio: regular rate and rhythm, S1, S2 normal, no murmur, click, rub or gallop GI: soft, non-tender; bowel sounds normal; no masses,  no organomegaly Extremities: extremities normal, atraumatic, no cyanosis or edema  Lab Results:    Basic Metabolic Panel:  Recent Labs  16/10/96 0504  NA 140  K 3.2*  CL 98  CO2 32  GLUCOSE 121*  BUN 15  CREATININE 0.49*  CALCIUM 8.7   Liver Function Tests: No results found for this basename: AST, ALT, ALKPHOS, BILITOT, PROT, ALBUMIN,  in the last 72 hours No results found for this basename: LIPASE, AMYLASE,  in the last 72 hours No results found for this basename: AMMONIA,  in the last 72 hours CBC:  Recent Labs  12/01/12 0504  WBC 5.1  HGB 13.7  HCT 43.9  MCV 89.2  PLT 169   Cardiac Enzymes: No results found for this basename: CKTOTAL, CKMB, CKMBINDEX, TROPONINI,  in the last 72 hours BNP: No results found for this basename: PROBNP,  in the last 72 hours D-Dimer: No results found for this basename: DDIMER,  in the last 72 hours CBG:  Recent Labs  11/30/12 1622 11/30/12 2052 12/01/12 0017 12/01/12 0801 12/01/12 1140 12/01/12 1625  GLUCAP 112* 116* 99 122* 163* 120*   Hemoglobin A1C: No results found for this basename: HGBA1C,  in the last 72 hours Fasting Lipid Panel: No results found for this basename: CHOL,  HDL, LDLCALC, TRIG, CHOLHDL, LDLDIRECT,  in the last 72 hours Thyroid Function Tests: No results found for this basename: TSH, T4TOTAL, FREET4, T3FREE, THYROIDAB,  in the last 72 hours Anemia Panel: No results found for this basename: VITAMINB12, FOLATE, FERRITIN, TIBC, IRON, RETICCTPCT,  in the last 72 hours Coagulation: No results found for this basename: LABPROT, INR,  in the last 72 hours Urine Drug Screen: Drugs of Abuse  No results found for this basename: labopia, cocainscrnur, labbenz, amphetmu, thcu, labbarb    Alcohol Level: No results found for this basename: ETH,  in the last 72 hours Urinalysis: No results found for this basename: COLORURINE, APPERANCEUR, LABSPEC, PHURINE, GLUCOSEU, HGBUR, BILIRUBINUR, KETONESUR, PROTEINUR, UROBILINOGEN, NITRITE, LEUKOCYTESUR,  in the last 72 hours Misc. Labs:  ABGS  Recent Labs  11/30/12 0833  PHART 7.448  PO2ART 60.4*  TCO2 33.3  HCO3 37.7*   CULTURES Recent Results (from the past 240 hour(s))  MRSA PCR SCREENING     Status: Abnormal   Collection Time    11/28/12  2:08 AM      Result Value Range Status   MRSA by PCR POSITIVE (*) NEGATIVE Final   Comment:            The GeneXpert MRSA Assay (FDA     approved for NASAL specimens     only), is one component of a     comprehensive MRSA colonization     surveillance program. It is not  intended to diagnose MRSA     infection nor to guide or     monitor treatment for     MRSA infections.     RESULT CALLED TO, READ BACK BY AND VERIFIED WITH:     WILSON A AT 0400 ON [id number] BY FORSYTH K  URINE CULTURE     Status: None   Collection Time    11/28/12  4:20 PM      Result Value Range Status   Specimen Description URINE, CATHETERIZED   Final   Special Requests NONE   Final   Culture  Setup Time 11/28/2012 18:45   Final   Colony Count NO GROWTH   Final   Culture NO GROWTH   Final   Report Status 11/29/2012 FINAL   Final  CULTURE, BLOOD (ROUTINE X 2)     Status: None    Collection Time    11/29/12  8:24 AM      Result Value Range Status   Specimen Description BLOOD RIGHT WRIST   Final   Special Requests BOTTLES DRAWN AEROBIC ONLY 4CC   Final   Culture NO GROWTH 4 DAYS   Final   Report Status PENDING   Incomplete  CULTURE, BLOOD (ROUTINE X 2)     Status: None   Collection Time    11/29/12  8:24 AM      Result Value Range Status   Specimen Description BLOOD RIGHT HAND   Final   Special Requests BOTTLES DRAWN AEROBIC AND ANAEROBIC 6CC   Final   Culture NO GROWTH 4 DAYS   Final   Report Status PENDING   Incomplete  CULTURE, RESPIRATORY (NON-EXPECTORATED)     Status: None   Collection Time    11/29/12  2:37 PM      Result Value Range Status   Specimen Description TRACHEAL ASPIRATE   Final   Special Requests NONE   Final   Gram Stain     Final   Value: ABUNDANT WBC PRESENT, PREDOMINANTLY PMN     NO SQUAMOUS EPITHELIAL CELLS SEEN     FEW GRAM NEGATIVE RODS   Culture ABUNDANT ESCHERICHIA COLI   Final   Report Status 12/02/2012 FINAL   Final   Organism ID, Bacteria ESCHERICHIA COLI   Final   Studies/Results: No results found.  Medications:  Prior to Admission:  Prescriptions prior to admission  Medication Sig Dispense Refill  . acetaminophen (TYLENOL) 325 MG tablet Take 325 mg by mouth 4 (four) times daily as needed for pain (Taken with Oxycodone 10 mg as needed for pain).      Marland Kitchen albuterol (PROAIR HFA) 108 (90 BASE) MCG/ACT inhaler Inhale 2 puffs into the lungs every 6 (six) hours as needed for wheezing or shortness of breath.      Marland Kitchen albuterol (PROVENTIL) (2.5 MG/3ML) 0.083% nebulizer solution Take 2.5 mg by nebulization every 6 (six) hours as needed. For shortness of breath      . budesonide-formoterol (SYMBICORT) 160-4.5 MCG/ACT inhaler Inhale 2 puffs into the lungs 2 (two) times daily.  1 Inhaler  0  . buPROPion (WELLBUTRIN XL) 150 MG 24 hr tablet Take 150 mg by mouth every morning.       . diazepam (VALIUM) 5 MG tablet Take 5 mg by mouth every 8  (eight) hours as needed. For anxiety      . diclofenac (VOLTAREN) 75 MG EC tablet Take 75 mg by mouth 2 (two) times daily.      . diclofenac sodium (VOLTAREN)  1 % GEL Apply 1 application topically 4 (four) times daily as needed. To knee for pain      . DULoxetine (CYMBALTA) 60 MG capsule Take 60 mg by mouth daily.        . fluticasone (FLONASE) 50 MCG/ACT nasal spray Place 2 sprays into the nose daily.      Marland Kitchen levalbuterol (XOPENEX HFA) 45 MCG/ACT inhaler Inhale 1-2 puffs into the lungs every 4 (four) hours as needed. For shortness of breath      . montelukast (SINGULAIR) 10 MG tablet Take 10 mg by mouth daily.       Marland Kitchen olopatadine (PATANOL) 0.1 % ophthalmic solution Place 1 drop into both eyes 2 (two) times daily.       . Oxycodone HCl 10 MG TABS Take 10 mg by mouth 4 (four) times daily as needed (for pain).      Marland Kitchen tiotropium (SPIRIVA) 18 MCG inhalation capsule Place 18 mcg into inhaler and inhale daily.      Marland Kitchen zolpidem (AMBIEN) 10 MG tablet Take 10 mg by mouth at bedtime as needed. For sleep       Scheduled: . aztreonam  1 g Intravenous Q8H  . budesonide-formoterol  2 puff Inhalation BID  . buPROPion  150 mg Oral Daily  . DULoxetine  60 mg Oral Daily  . enoxaparin (LOVENOX) injection  40 mg Subcutaneous Q24H  . ipratropium  0.5 mg Nebulization Q4H  . levalbuterol  0.63 mg Nebulization Q4H  . methylPREDNISolone (SOLU-MEDROL) injection  60 mg Intravenous Q12H  . montelukast  10 mg Oral Daily  . nicotine  21 mg Transdermal Daily  . olopatadine  1 drop Both Eyes BID  . pantoprazole  40 mg Oral Daily  . sodium chloride  3 mL Intravenous Q12H  . vancomycin  1,000 mg Intravenous Q8H   Continuous:  ZOX:[id number], acetaminophen, levalbuterol, ondansetron (ZOFRAN) IV, ondansetron, oxyCODONE, sodium chloride, zolpidem  Assesment: She was admitted with acute on chronic respiratory failure. She does have community acquired pneumonia as well. She has severe COPD but overall is  improving Principal Problem:   COPD (chronic obstructive pulmonary disease) Active Problems:   Smoking   Chronic pain disorder   Metabolic encephalopathy   Acute-on-chronic respiratory failure   CAP (community acquired pneumonia)    Plan: Continue current treatments. I think she's approaching baseline    LOS: 6 days   Bethany Villa L 12/03/2012, 7:45 AM

## 2012-12-03 NOTE — Progress Notes (Signed)
[facility] PROGRESS NOTE  Bethany Villa MVH:[id number] DOB: 13-Jul-1956 DOA: 11/27/2012 PCP: Bennie Pierini, FNP Pulmonologist: Dr. Sherene Sires  Assessment: 1. Acute respiratory failure, respiratory acidosis: Continues to improve status post extubation. Multifactorial: pneumonia, COPD exacerbation superimposed on chronic respiratory failure. Appreciate pulmonology evaluation and ongoing care. 2. Pneumonia: Tracheal aspirate Escherichia coli resistant to Levaquin. Nevertheless has improved clinically. Fever resolved with addition of vancomycin therefore despite culture data we will continue vancomycin through 4/7. Reports anaphylaxis secondary to penicillin. Treat Escherichia coli with a aztreonam, given clinical improvement, short course. 3. COPD exacerbation: Improving. Decreased steroids 4/7. Continue oxygen nebulizers. Wean oxygen to 2.5 L. 4. Acute encephalopathy: Resolved. Secondary to acute illness. 5. COPD, chronic respiratory failure on home oxygen: Plan as above. 6. Ongoing cigarette smoking: I have counseled cessation. 7. Fibromyalgia 8. Chronic pain: Stable. 9. Anxiety: Stable.  Plan: 1. Continue oxygen at chronic 2.5 L 2. Continue current antibiotics. 3. Increase activity as tolerated.   Code Status: Full code Family Communication: None bedside. Disposition Plan: Home when improved  Brendia Sacks, MD  [facility]  Pager [phone] If 7PM-7AM, please contact night-coverage at [email or web address], password [facility] 12/03/2012, 11:37 AM  LOS: 6 days   Brief narrative: 57 year old woman history of oxygen-dependent COPD presented with three-day history upper respiratory symptoms and increasing shortness of breath. She was admitted to the medical floor for COPD exacerbation. She became increasingly somnolent, ABG revealed acute respiratory acidosis patient was transferred to the ICU and placed on BiPAP. She failed to improve and was subsequently intubated 4/1. Pulmonology was  consulted.  Consultants:  Pulmonology  Procedures:  Intubation 4/1 >> 4/3  Antibiotics:  Levofloxacin 3/31 >>  Vancomycin 4/2 >>  HPI/Subjective: Breathing okay at rest. Very short of breath with movement. Chronic back pain control. Slept well.  Objective: Filed Vitals:   12/02/12 2346 12/03/12 0457 12/03/12 0754 12/03/12 1117  BP:  130/85    Pulse:  85    Temp:  97.6 F (36.4 C)    TempSrc:  Oral    Resp:  16    Height:      Weight:      SpO2: 92% 95% 96% 94%    Intake/Output Summary (Last 24 hours) at 12/03/12 1137 Last data filed at 12/02/12 1700  Gross per 24 hour  Intake    480 ml  Output    300 ml  Net    180 ml   Filed Weights   11/30/12 0500 12/01/12 0500 12/02/12 0600  Weight: 110.7 kg (244 lb 0.8 oz) 110.8 kg (244 lb 4.3 oz) 106.4 kg (234 lb 9.1 oz)    Exam:  General: Appears comfortable, calm. No acute distress. Cardiovascular: RRR, no m/r/g. 1+ bilateral LE edema. Respiratory: Fair air movement, some wheezes. No rales or rhonchi. Moderate increased respiratory effort.  Psychiatric: Speech fluent and clear.  Data Reviewed: Basic Metabolic Panel:  Recent Labs Lab 11/27/12 1534 11/28/12 0602 11/29/12 0505 11/30/12 0440 12/01/12 0504  NA 145 143 141 138 140  K 3.7 4.9 4.2 3.3* 3.2*  CL 101 102 98 95* 98  CO2 38* 39* 39* 35* 32  GLUCOSE 135* 128* 124* 126* 121*  BUN 5* 5* 16 15 15   CREATININE 0.50 0.44* 0.62 0.55 0.49*  CALCIUM 8.9 8.8 8.9 8.6 8.7   CBC:  Recent Labs Lab 11/27/12 1534 11/28/12 0602 11/29/12 0505 11/30/12 0440 12/01/12 0504  WBC 11.2* 7.1 6.3 7.0 5.1  NEUTROABS 9.0*  --   --   --   --  HGB 14.2 13.9 13.4 13.5 13.7  HCT 46.7* 48.3* 46.5* 43.6 43.9  MCV 94.7 96.8 96.5 90.8 89.2  PLT 202 212 194 168 169   Cardiac Enzymes:  Recent Labs Lab 11/27/12 1534  TROPONINI <0.30   BNP (last 3 results)  Recent Labs  11/27/12 1534  PROBNP 228.4*   CBG:  Recent Labs Lab 11/30/12 2052 12/01/12 0017  12/01/12 0801 12/01/12 1140 12/01/12 1625  GLUCAP 116* 99 122* 163* 120*   ABG    Component Value Date/Time   PHART 7.448 11/30/2012 0833   PCO2ART 55.3* 11/30/2012 0833   PO2ART 60.4* 11/30/2012 0833   HCO3 37.7* 11/30/2012 0833   TCO2 33.3 11/30/2012 0833   ACIDBASEDEF 11.7* 11/30/2012 0833   O2SAT 91.4 11/30/2012 0833     Recent Results (from the past 240 hour(s))  MRSA PCR SCREENING     Status: Abnormal   Collection Time    11/28/12  2:08 AM      Result Value Range Status   MRSA by PCR POSITIVE (*) NEGATIVE Final   Comment:            The GeneXpert MRSA Assay (FDA     approved for NASAL specimens     only), is one component of a     comprehensive MRSA colonization     surveillance program. It is not     intended to diagnose MRSA     infection nor to guide or     monitor treatment for     MRSA infections.     RESULT CALLED TO, READ BACK BY AND VERIFIED WITH:     WILSON A AT 0400 ON [id number] BY FORSYTH K  URINE CULTURE     Status: None   Collection Time    11/28/12  4:20 PM      Result Value Range Status   Specimen Description URINE, CATHETERIZED   Final   Special Requests NONE   Final   Culture  Setup Time 11/28/2012 18:45   Final   Colony Count NO GROWTH   Final   Culture NO GROWTH   Final   Report Status 11/29/2012 FINAL   Final  CULTURE, BLOOD (ROUTINE X 2)     Status: None   Collection Time    11/29/12  8:24 AM      Result Value Range Status   Specimen Description BLOOD RIGHT WRIST   Final   Special Requests BOTTLES DRAWN AEROBIC ONLY 4CC   Final   Culture NO GROWTH 4 DAYS   Final   Report Status PENDING   Incomplete  CULTURE, BLOOD (ROUTINE X 2)     Status: None   Collection Time    11/29/12  8:24 AM      Result Value Range Status   Specimen Description BLOOD RIGHT HAND   Final   Special Requests BOTTLES DRAWN AEROBIC AND ANAEROBIC 6CC   Final   Culture NO GROWTH 4 DAYS   Final   Report Status PENDING   Incomplete  CULTURE, RESPIRATORY (NON-EXPECTORATED)      Status: None   Collection Time    11/29/12  2:37 PM      Result Value Range Status   Specimen Description TRACHEAL ASPIRATE   Final   Special Requests NONE   Final   Gram Stain     Final   Value: ABUNDANT WBC PRESENT, PREDOMINANTLY PMN     NO SQUAMOUS EPITHELIAL CELLS SEEN     FEW GRAM NEGATIVE RODS  Culture ABUNDANT ESCHERICHIA COLI   Final   Report Status 12/02/2012 FINAL   Final   Organism ID, Bacteria ESCHERICHIA COLI   Final     Studies: No results found.  Scheduled Meds: . aztreonam  1 g Intravenous Q8H  . budesonide-formoterol  2 puff Inhalation BID  . buPROPion  150 mg Oral Daily  . DULoxetine  60 mg Oral Daily  . enoxaparin (LOVENOX) injection  40 mg Subcutaneous Q24H  . ipratropium  0.5 mg Nebulization Q4H  . levalbuterol  0.63 mg Nebulization Q4H  . methylPREDNISolone (SOLU-MEDROL) injection  60 mg Intravenous Q12H  . montelukast  10 mg Oral Daily  . nicotine  21 mg Transdermal Daily  . olopatadine  1 drop Both Eyes BID  . pantoprazole  40 mg Oral Daily  . sodium chloride  3 mL Intravenous Q12H  . vancomycin  1,000 mg Intravenous Q8H   Continuous Infusions:    Principal Problem:   COPD (chronic obstructive pulmonary disease) Active Problems:   Smoking   Chronic pain disorder   Metabolic encephalopathy   Acute-on-chronic respiratory failure   CAP (community acquired pneumonia)    Brendia Sacks, MD  [facility] Pager [phone] If 7PM-7AM, please contact night-coverage at [email or web address], password [facility] 12/03/2012, 11:37 AM  LOS: 6 days   Time spent: 15 minutes

## 2012-12-04 DIAGNOSIS — J449 Chronic obstructive pulmonary disease, unspecified: Secondary | ICD-10-CM | POA: Diagnosis not present

## 2012-12-04 DIAGNOSIS — J9620 Acute and chronic respiratory failure, unspecified whether with hypoxia or hypercapnia: Secondary | ICD-10-CM | POA: Diagnosis not present

## 2012-12-04 DIAGNOSIS — J155 Pneumonia due to Escherichia coli: Secondary | ICD-10-CM | POA: Diagnosis not present

## 2012-12-04 LAB — CULTURE, BLOOD (ROUTINE X 2): Culture: NO GROWTH

## 2012-12-04 LAB — BASIC METABOLIC PANEL
BUN: 10 mg/dL (ref 6–23)
Calcium: 8.6 mg/dL (ref 8.4–10.5)
Creatinine, Ser: 0.55 mg/dL (ref 0.50–1.10)
GFR calc Af Amer: >90 mL/min (ref >90)
GFR calc non Af Amer: >90 mL/min (ref >90)

## 2012-12-04 LAB — CBC
HCT: 46.4 % — ABNORMAL HIGH (ref 36.0–46.0)
MCHC: 31 g/dL (ref 30.0–36.0)
MCV: 89.6 fL (ref 78.0–100.0)
Platelets: 156 10*3/uL (ref 150–400)
RDW: 14.3 % (ref 11.5–15.5)
WBC: 7.1 10*3/uL (ref 4.0–10.5)

## 2012-12-04 MED ORDER — PREDNISONE 20 MG PO TABS
40.0000 mg | ORAL_TABLET | Freq: Every day | ORAL | Status: DC
  Administered 2012-12-05 – 2012-12-06 (×2): 40 mg via ORAL
  Filled 2012-12-04 (×2): qty 2

## 2012-12-04 MED ORDER — POTASSIUM CHLORIDE CRYS ER 20 MEQ PO TBCR
40.0000 meq | EXTENDED_RELEASE_TABLET | Freq: Two times a day (BID) | ORAL | Status: AC
  Administered 2012-12-04 (×2): 40 meq via ORAL
  Filled 2012-12-04 (×2): qty 2

## 2012-12-04 NOTE — Progress Notes (Addendum)
[facility] PROGRESS NOTE  Bethany Villa:[id number] DOB: September 22, 1955 DOA: 11/27/2012 PCP: Bennie Pierini, FNP Pulmonologist: Dr. Sherene Sires  Assessment: 1. Acute respiratory failure, respiratory acidosis: Continues to improve status post extubation. Multifactorial: pneumonia, COPD exacerbation superimposed on chronic respiratory failure.  2. Pneumonia: Tracheal aspirate Escherichia coli resistant to Levaquin. Nevertheless has improved clinically. Fever resolved with addition of vancomycin therefore despite culture data  will continue vancomycin. Reports anaphylaxis secondary to penicillin. Treat Escherichia coli with a aztreonam, given clinical improvement, short course. 3. COPD exacerbation: Improving. Continue oxygen nebulizers, steroids. Wean oxygen to 2.5 L. 4. Acute encephalopathy: Resolved. Secondary to acute illness. 5. COPD, chronic respiratory failure on home oxygen: Plan as above. 6. Hypokalemia: Replete. 7. Ongoing cigarette smoking: I have counseled cessation. 8. Fibromyalgia 9. Chronic pain: Stable. 10. Anxiety: Stable.  Plan: 1. Continue oxygen at chronic 2.5 L 2. Change to oral steroids. 3. Continue current antibiotics. 4. Increase activity as tolerated.   Code Status: Full code Family Communication: None bedside. Disposition Plan: Home when improved  Brendia Sacks, MD  [facility]  Pager [phone] If 7PM-7AM, please contact night-coverage at [email or web address], password [facility] 12/04/2012, 3:52 PM  LOS: 7 days   Brief narrative: 57 year old woman history of oxygen-dependent COPD presented with three-day history upper respiratory symptoms and increasing shortness of breath. She was admitted to the medical floor for COPD exacerbation. She became increasingly somnolent, ABG revealed acute respiratory acidosis patient was transferred to the ICU and placed on BiPAP. She failed to improve and was subsequently intubated 4/1. Pulmonology was  consulted.  Consultants:  Pulmonology  Procedures:  Intubation 4/1 >> 4/3  Antibiotics:  Levofloxacin 3/31 >> 4/5  Aztreonam 4/5 >>  Vancomycin 4/2 >>  HPI/Subjective: Feeling better. Less short of breath with exertion.  Objective: Filed Vitals:   12/04/12 0801 12/04/12 1145 12/04/12 1402 12/04/12 1534  BP:   150/93   Pulse:   92   Temp:   97.6 F (36.4 C)   TempSrc:   Oral   Resp:   18   Height:      Weight:      SpO2: 91% 92% 94% 86%    Intake/Output Summary (Last 24 hours) at 12/04/12 1552 Last data filed at 12/04/12 1258  Gross per 24 hour  Intake   1720 ml  Output      0 ml  Net   1720 ml   Filed Weights   11/30/12 0500 12/01/12 0500 12/02/12 0600  Weight: 110.7 kg (244 lb 0.8 oz) 110.8 kg (244 lb 4.3 oz) 106.4 kg (234 lb 9.1 oz)    Exam:  General: Appears comfortable, calm. Nontoxic. Cardiovascular: RRR, no m/r/g.  Respiratory: Improved air movement. No wheezes.. No rales or rhonchi. Mild increased respiratory effort.  Psychiatric: Speech fluent and clear.  Data Reviewed: Basic Metabolic Panel:  Recent Labs Lab 11/28/12 0602 11/29/12 0505 11/30/12 0440 12/01/12 0504 12/04/12 0407  NA 143 141 138 140 139  K 4.9 4.2 3.3* 3.2* 3.3*  CL 102 98 95* 98 95*  CO2 39* 39* 35* 32 36*  GLUCOSE 128* 124* 126* 121* 108*  BUN 5* 16 15 15 10   CREATININE 0.44* 0.62 0.55 0.49* 0.55  CALCIUM 8.8 8.9 8.6 8.7 8.6   CBC:  Recent Labs Lab 11/28/12 0602 11/29/12 0505 11/30/12 0440 12/01/12 0504 12/04/12 0407  WBC 7.1 6.3 7.0 5.1 7.1  HGB 13.9 13.4 13.5 13.7 14.4  HCT 48.3* 46.5* 43.6 43.9 46.4*  MCV 96.8 96.5 90.8 89.2 89.6  PLT 212 194 168 169 156     Recent Labs  11/27/12 1534  PROBNP 228.4*   CBG:  Recent Labs Lab 11/30/12 2052 12/01/12 0017 12/01/12 0801 12/01/12 1140 12/01/12 1625  GLUCAP 116* 99 122* 163* 120*   ABG    Component Value Date/Time   PHART 7.448 11/30/2012 0833   PCO2ART 55.3* 11/30/2012 0833   PO2ART 60.4*  11/30/2012 0833   HCO3 37.7* 11/30/2012 0833   TCO2 33.3 11/30/2012 0833   ACIDBASEDEF 11.7* 11/30/2012 0833   O2SAT 91.4 11/30/2012 0833     Recent Results (from the past 240 hour(s))  MRSA PCR SCREENING     Status: Abnormal   Collection Time    11/28/12  2:08 AM      Result Value Range Status   MRSA by PCR POSITIVE (*) NEGATIVE Final   Comment:            The GeneXpert MRSA Assay (FDA     approved for NASAL specimens     only), is one component of a     comprehensive MRSA colonization     surveillance program. It is not     intended to diagnose MRSA     infection nor to guide or     monitor treatment for     MRSA infections.     RESULT CALLED TO, READ BACK BY AND VERIFIED WITH:     WILSON A AT 0400 ON [id number] BY FORSYTH K  URINE CULTURE     Status: None   Collection Time    11/28/12  4:20 PM      Result Value Range Status   Specimen Description URINE, CATHETERIZED   Final   Special Requests NONE   Final   Culture  Setup Time 11/28/2012 18:45   Final   Colony Count NO GROWTH   Final   Culture NO GROWTH   Final   Report Status 11/29/2012 FINAL   Final  CULTURE, BLOOD (ROUTINE X 2)     Status: None   Collection Time    11/29/12  8:24 AM      Result Value Range Status   Specimen Description BLOOD RIGHT WRIST   Final   Special Requests BOTTLES DRAWN AEROBIC ONLY 4CC   Final   Culture NO GROWTH 5 DAYS   Final   Report Status 12/04/2012 FINAL   Final  CULTURE, BLOOD (ROUTINE X 2)     Status: None   Collection Time    11/29/12  8:24 AM      Result Value Range Status   Specimen Description BLOOD RIGHT HAND   Final   Special Requests BOTTLES DRAWN AEROBIC AND ANAEROBIC 6CC   Final   Culture NO GROWTH 5 DAYS   Final   Report Status 12/04/2012 FINAL   Final  CULTURE, RESPIRATORY (NON-EXPECTORATED)     Status: None   Collection Time    11/29/12  2:37 PM      Result Value Range Status   Specimen Description TRACHEAL ASPIRATE   Final   Special Requests NONE   Final   Gram Stain      Final   Value: ABUNDANT WBC PRESENT, PREDOMINANTLY PMN     NO SQUAMOUS EPITHELIAL CELLS SEEN     FEW GRAM NEGATIVE RODS   Culture ABUNDANT ESCHERICHIA COLI   Final   Report Status 12/02/2012 FINAL   Final   Organism ID, Bacteria ESCHERICHIA COLI   Final     Studies: No results  found.  Scheduled Meds: . aztreonam  1 g Intravenous Q8H  . budesonide-formoterol  2 puff Inhalation BID  . buPROPion  150 mg Oral Daily  . DULoxetine  60 mg Oral Daily  . enoxaparin (LOVENOX) injection  40 mg Subcutaneous Q24H  . ipratropium  0.5 mg Nebulization Q4H  . levalbuterol  0.63 mg Nebulization Q4H  . methylPREDNISolone (SOLU-MEDROL) injection  60 mg Intravenous Q12H  . montelukast  10 mg Oral Daily  . nicotine  21 mg Transdermal Daily  . olopatadine  1 drop Both Eyes BID  . pantoprazole  40 mg Oral Daily  . potassium chloride  40 mEq Oral BID  . sodium chloride  3 mL Intravenous Q12H  . vancomycin  1,000 mg Intravenous Q8H   Continuous Infusions:    Principal Problem:   COPD (chronic obstructive pulmonary disease) Active Problems:   Smoking   Chronic pain disorder   Metabolic encephalopathy   Acute-on-chronic respiratory failure   CAP (community acquired pneumonia)   Pneumonia due to Escherichia coli    Brendia Sacks, MD  [facility] Pager [phone] If 7PM-7AM, please contact night-coverage at [email or web address], password [facility] 12/04/2012, 3:52 PM  LOS: 7 days   Time spent: 15 minutes

## 2012-12-04 NOTE — Clinical Documentation Improvement (Signed)
Abnormal Labs Clarification  THIS DOCUMENT IS NOT A PERMANENT PART OF THE MEDICAL RECORD  TO RESPOND TO THE THIS QUERY, FOLLOW THE INSTRUCTIONS BELOW:  1. If needed, update documentation for the patient's encounter via the notes activity.  2. Access this query again and click edit on the [employer].  3. After updating, or not, click F2 to complete all highlighted (required) fields concerning your review. Select "additional documentation in the medical record" OR "no additional documentation provided".  4. Click Sign note button.  5. The deficiency will fall out of your InBasket *Please let us know if you are not able to complete this workflow by phone or e-mail (listed below).  Please update your documentation within the medical record to reflect your response to this query.                                                                                   12/04/12  Dear Dr. Irene Limbo Marton Redwood  In a better effort to capture your patient's severity of illness, reflect appropriate length of stay and utilization of resources, a review of the medical record has revealed the following indicators.    Based on your clinical judgment, please clarify and document in a progress note and/or discharge summary the clinical condition associated with the following supporting information:  In responding to this query please exercise your independent judgment.  The fact that a query is asked, does not imply that any particular answer is desired or expected.  Abnormal findings (laboratory, x-ray, pathologic, and other diagnostic results) are not coded and reported unless the physician indicates their clinical significance.   The medical record reflects the following clinical findings, please clarify the diagnostic and/or clinical significance:      Possible Clinical Conditions?                                  Hypokalemia                                                           Other  Condition___________________                 Cannot Clinically Determine_________  Supporting Information: Risk Factors: Admitted with: Acute on Chronic respiratory failure Pneumonia COPD exacerbation Metabolic encephalopathy Mechanical ventilation 4/3, 4/4, 4/5 progress notes "Replace potassium"  Lab Test: Potassium           Patient Values:  3/31 =3.7 4/1 =4.9 4/2 = 4.2 4/3 = 3.3 4/4 = 3.2 4/7 = 3.3   Normal Values : 3.5-5.1  Treatment:  Potassium Chloride BID    Reviewed: Response found in chart 4/7pn Dr. Nicholes Stairs  Thank You,  Harless Litten RN, MSN Clinical Documentation Specialist: Isidore Moos [phone] [facility] [place]

## 2012-12-04 NOTE — Progress Notes (Signed)
NUTRITION FOLLOW UP  Intervention:   Continue current nutrition care  Nutrition Dx:   Inadequate oral intake; resolved  Goal:   Pt to meet >/= 90% of their estimated nutrition needs;  met  Monitor:   Po intake, labs and wt trends  Assessment:   Pt extubated and diet advanced po's excellent (100% meals). Re-estimated nutrition needs. Last BM 11/27/12.  Height: Ht Readings from Last 1 Encounters:  11/28/12 5\' 3"  (1.6 m)    Weight Status:   Wt Readings from Last 1 Encounters:  12/02/12 234 lb 9.1 oz (106.4 kg)    Re-estimated needs:  Kcal: 1500-1700  Protein: 70-85 gr Fluid:>1700 ml/day   Skin: No issues noted  Diet Order: General   Intake/Output Summary (Last 24 hours) at 12/04/12 1009 Last data filed at 12/03/12 1800  Gross per 24 hour  Intake   1480 ml  Output      0 ml  Net   1480 ml    Last BM: 11/27/12    Labs:   Recent Labs Lab 11/30/12 0440 12/01/12 0504 12/04/12 0407  NA 138 140 139  K 3.3* 3.2* 3.3*  CL 95* 98 95*  CO2 35* 32 36*  BUN 15 15 10   CREATININE 0.55 0.49* 0.55  CALCIUM 8.6 8.7 8.6  GLUCOSE 126* 121* 108*    CBG (last 3)   Recent Labs  12/01/12 1140 12/01/12 1625  GLUCAP 163* 120*    Scheduled Meds: . aztreonam  1 g Intravenous Q8H  . budesonide-formoterol  2 puff Inhalation BID  . buPROPion  150 mg Oral Daily  . DULoxetine  60 mg Oral Daily  . enoxaparin (LOVENOX) injection  40 mg Subcutaneous Q24H  . ipratropium  0.5 mg Nebulization Q4H  . levalbuterol  0.63 mg Nebulization Q4H  . methylPREDNISolone (SOLU-MEDROL) injection  60 mg Intravenous Q12H  . montelukast  10 mg Oral Daily  . nicotine  21 mg Transdermal Daily  . olopatadine  1 drop Both Eyes BID  . pantoprazole  40 mg Oral Daily  . potassium chloride  40 mEq Oral BID  . sodium chloride  3 mL Intravenous Q12H  . vancomycin  1,000 mg Intravenous Q8H    Continuous Infusions:   Royann Shivers MS,RD,LDN,CSG Office: [phone] Pager: [phone]

## 2012-12-05 DIAGNOSIS — J155 Pneumonia due to Escherichia coli: Secondary | ICD-10-CM | POA: Diagnosis not present

## 2012-12-05 DIAGNOSIS — J449 Chronic obstructive pulmonary disease, unspecified: Secondary | ICD-10-CM | POA: Diagnosis not present

## 2012-12-05 DIAGNOSIS — F17200 Nicotine dependence, unspecified, uncomplicated: Secondary | ICD-10-CM | POA: Diagnosis not present

## 2012-12-05 DIAGNOSIS — J9620 Acute and chronic respiratory failure, unspecified whether with hypoxia or hypercapnia: Secondary | ICD-10-CM | POA: Diagnosis not present

## 2012-12-05 MED ORDER — MENTHOL 3 MG MT LOZG
1.0000 | LOZENGE | OROMUCOSAL | Status: DC | PRN
  Administered 2012-12-05: 3 mg via ORAL
  Filled 2012-12-05: qty 9

## 2012-12-05 NOTE — Progress Notes (Addendum)
[facility] PROGRESS NOTE  Bethany Villa OZH:[id number] DOB: April 10, 1956 DOA: 11/27/2012 PCP: Bennie Pierini, FNP Pulmonologist: Dr. Sherene Sires  Brief narrative: 57 year old woman history of oxygen-dependent COPD presented with three-day history upper respiratory symptoms and increasing shortness of breath. She was admitted to the medical floor for COPD exacerbation. She became increasingly somnolent, ABG revealed acute respiratory acidosis patient was transferred to the ICU and placed on BiPAP. She failed to improve and was subsequently intubated 4/1. Pulmonology was consulted. She developed fever and vancomycin was empirically added to Levaquin therapy. She gradually improved with treatment for pneumonia and COPD exacerbation and was extubated without difficulty. Tracheal aspirate grew out abundant Escherichia coli resistant to Levaquin. Because of her clinical improvement after vancomycin this drug was continued for a total of 8 days. Levaquin was discontinued based on culture results and the patient was started on as aztreonam secondary to significant penicillin allergy. Given her clinical improvement on Levaquin and vancomycin he was felt a short course of aztreonam could be pursued and once completed discharged without antibiotics. She completed antibiotic therapy 4/9 and discharged home that day is expected.  Assessment: 1. Acute respiratory failure, respiratory acidosis: Appears resolved. Multifactorial: pneumonia, COPD exacerbation superimposed on chronic respiratory failure.  2. Pneumonia: Tracheal aspirate Escherichia coli resistant to Levaquin. Nevertheless has improved clinically. Fever resolved with addition of vancomycin therefore despite culture data will continue vancomycin. Reports anaphylaxis secondary to penicillin. Treat Escherichia coli with a aztreonam, given clinical improvement, short course: Discussed with infectious disease. 3. COPD exacerbation: Improving. Continue oxygen  nebulizers, steroids. Wean oxygen to 2.5 L. 4. Acute encephalopathy: Resolved. Secondary to acute illness. 5. COPD, chronic respiratory failure on home oxygen: Plan as above. 6. Hypokalemia: Replete. 7. Ongoing cigarette smoking: I have counseled cessation. 8. Fibromyalgia 9. Chronic pain: Stable. 10. Anxiety: Stable.  Plan: 1. Continue oxygen at chronic 2.5 L 2. Slow steroid taper. 3. Continue current antibiotics, last day 4/9 4. Home 4/9 without further antibiotics  Code Status: Full code Family Communication: None bedside. Disposition Plan: Home   Brendia Sacks, MD  [facility]  Pager [phone] If 7PM-7AM, please contact night-coverage at [email or web address], password [facility] 12/05/2012, 10:40 AM  LOS: 8 days   Consultants:  Pulmonology  Procedures:  Intubation 4/1 >> 4/3  Antibiotics:  Levofloxacin 3/31 >> 4/5  Aztreonam 4/5 >> 4/9  Vancomycin 4/2 >> 4/9  HPI/Subjective: Complains of sore throat but continues to improve otherwise. Still some wheezing. Breathing okay.  Objective: Filed Vitals:   12/04/12 2027 12/04/12 2314 12/05/12 0458 12/05/12 0734  BP:  158/99 188/93   Pulse:  97 92   Temp:  97.8 F (36.6 C) 98.1 F (36.7 C)   TempSrc:  Oral Oral   Resp:  20 20   Height:      Weight:      SpO2: 94% 91% 93% 96%    Intake/Output Summary (Last 24 hours) at 12/05/12 1040 Last data filed at 12/05/12 0900  Gross per 24 hour  Intake   1480 ml  Output      0 ml  Net   1480 ml   Filed Weights   11/30/12 0500 12/01/12 0500 12/02/12 0600  Weight: 110.7 kg (244 lb 0.8 oz) 110.8 kg (244 lb 4.3 oz) 106.4 kg (234 lb 9.1 oz)    Exam:  General: Appears comfortable, calm. Appears better. Cardiovascular: RRR, no m/r/g.  Respiratory: Fair air movement. Few wheezes. No rales or rhonchi. Minimal increased respiratory effort.  Psychiatric: Speech fluent and clear.  Data Reviewed: Basic Metabolic Panel:  Recent Labs Lab 11/29/12 0505 11/30/12 0440  12/01/12 0504 12/04/12 0407  NA 141 138 140 139  K 4.2 3.3* 3.2* 3.3*  CL 98 95* 98 95*  CO2 39* 35* 32 36*  GLUCOSE 124* 126* 121* 108*  BUN 16 15 15 10   CREATININE 0.62 0.55 0.49* 0.55  CALCIUM 8.9 8.6 8.7 8.6   CBC:  Recent Labs Lab 11/29/12 0505 11/30/12 0440 12/01/12 0504 12/04/12 0407  WBC 6.3 7.0 5.1 7.1  HGB 13.4 13.5 13.7 14.4  HCT 46.5* 43.6 43.9 46.4*  MCV 96.5 90.8 89.2 89.6  PLT 194 168 169 156     Recent Labs  11/27/12 1534  PROBNP 228.4*   CBG:  Recent Labs Lab 11/30/12 2052 12/01/12 0017 12/01/12 0801 12/01/12 1140 12/01/12 1625  GLUCAP 116* 99 122* 163* 120*   ABG    Component Value Date/Time   PHART 7.448 11/30/2012 0833   PCO2ART 55.3* 11/30/2012 0833   PO2ART 60.4* 11/30/2012 0833   HCO3 37.7* 11/30/2012 0833   TCO2 33.3 11/30/2012 0833   ACIDBASEDEF 11.7* 11/30/2012 0833   O2SAT 91.4 11/30/2012 0833     Recent Results (from the past 240 hour(s))  MRSA PCR SCREENING     Status: Abnormal   Collection Time    11/28/12  2:08 AM      Result Value Range Status   MRSA by PCR POSITIVE (*) NEGATIVE Final   Comment:            The GeneXpert MRSA Assay (FDA     approved for NASAL specimens     only), is one component of a     comprehensive MRSA colonization     surveillance program. It is not     intended to diagnose MRSA     infection nor to guide or     monitor treatment for     MRSA infections.     RESULT CALLED TO, READ BACK BY AND VERIFIED WITH:     WILSON A AT 0400 ON [id number] BY FORSYTH K  URINE CULTURE     Status: None   Collection Time    11/28/12  4:20 PM      Result Value Range Status   Specimen Description URINE, CATHETERIZED   Final   Special Requests NONE   Final   Culture  Setup Time 11/28/2012 18:45   Final   Colony Count NO GROWTH   Final   Culture NO GROWTH   Final   Report Status 11/29/2012 FINAL   Final  CULTURE, BLOOD (ROUTINE X 2)     Status: None   Collection Time    11/29/12  8:24 AM      Result Value Range  Status   Specimen Description BLOOD RIGHT WRIST   Final   Special Requests BOTTLES DRAWN AEROBIC ONLY 4CC   Final   Culture NO GROWTH 5 DAYS   Final   Report Status 12/04/2012 FINAL   Final  CULTURE, BLOOD (ROUTINE X 2)     Status: None   Collection Time    11/29/12  8:24 AM      Result Value Range Status   Specimen Description BLOOD RIGHT HAND   Final   Special Requests BOTTLES DRAWN AEROBIC AND ANAEROBIC 6CC   Final   Culture NO GROWTH 5 DAYS   Final   Report Status 12/04/2012 FINAL   Final  CULTURE, RESPIRATORY (NON-EXPECTORATED)     Status: None  Collection Time    11/29/12  2:37 PM      Result Value Range Status   Specimen Description TRACHEAL ASPIRATE   Final   Special Requests NONE   Final   Gram Stain     Final   Value: ABUNDANT WBC PRESENT, PREDOMINANTLY PMN     NO SQUAMOUS EPITHELIAL CELLS SEEN     FEW GRAM NEGATIVE RODS   Culture ABUNDANT ESCHERICHIA COLI   Final   Report Status 12/02/2012 FINAL   Final   Organism ID, Bacteria ESCHERICHIA COLI   Final     Studies: No results found.  Scheduled Meds: . aztreonam  1 g Intravenous Q8H  . budesonide-formoterol  2 puff Inhalation BID  . buPROPion  150 mg Oral Daily  . DULoxetine  60 mg Oral Daily  . enoxaparin (LOVENOX) injection  40 mg Subcutaneous Q24H  . ipratropium  0.5 mg Nebulization Q4H  . levalbuterol  0.63 mg Nebulization Q4H  . montelukast  10 mg Oral Daily  . nicotine  21 mg Transdermal Daily  . olopatadine  1 drop Both Eyes BID  . pantoprazole  40 mg Oral Daily  . predniSONE  40 mg Oral Q breakfast  . sodium chloride  3 mL Intravenous Q12H  . vancomycin  1,000 mg Intravenous Q8H   Continuous Infusions:    Principal Problem:   COPD (chronic obstructive pulmonary disease) Active Problems:   Smoking   Chronic pain disorder   Metabolic encephalopathy   Acute-on-chronic respiratory failure   CAP (community acquired pneumonia)   Pneumonia due to Escherichia coli    Brendia Sacks, MD  [facility] Pager [phone] If 7PM-7AM, please contact night-coverage at [email or web address], password [facility] 12/05/2012, 10:40 AM  LOS: 8 days   Time spent: 15 minutes

## 2012-12-05 NOTE — Progress Notes (Signed)
ANTIBIOTIC CONSULT NOTE   Pharmacy Consult for Vancomycin / Aztreonam Indication: PNA  Allergies  Allergen Reactions  . Penicillins Shortness Of Breath and Swelling  . Sulfa Antibiotics Shortness Of Breath and Swelling  . Mobic (Meloxicam)    Patient Measurements: Height: 5\' 3"  (160 cm) Weight: 234 lb 9.1 oz (106.4 kg) IBW/kg (Calculated) : 52.4  Vital Signs: Temp: 98.1 F (36.7 C) (04/08 0458) Temp src: Oral (04/08 0458) BP: 188/93 mmHg (04/08 0458) Pulse Rate: 92 (04/08 0458) Intake/Output from previous day: 04/07 0701 - 04/08 0700 In: 1480 [P.O.:480; IV Piggyback:1000] Out: -  Intake/Output from this shift:    Labs:  Recent Labs  12/04/12 0407  WBC 7.1  HGB 14.4  PLT 156  CREATININE 0.55   Estimated Creatinine Clearance: 92.8 ml/min (by C-G formula based on Cr of 0.55). No results found for this basename: VANCOTROUGH, Leodis Binet, VANCORANDOM, GENTTROUGH, GENTPEAK, GENTRANDOM, TOBRATROUGH, TOBRAPEAK, TOBRARND, AMIKACINPEAK, AMIKACINTROU, AMIKACIN,  in the last 72 hours  Microbiology: Recent Results (from the past 720 hour(s))  MRSA PCR SCREENING     Status: Abnormal   Collection Time    11/28/12  2:08 AM      Result Value Range Status   MRSA by PCR POSITIVE (*) NEGATIVE Final   Comment:            The GeneXpert MRSA Assay (FDA     approved for NASAL specimens     only), is one component of a     comprehensive MRSA colonization     surveillance program. It is not     intended to diagnose MRSA     infection nor to guide or     monitor treatment for     MRSA infections.     RESULT CALLED TO, READ BACK BY AND VERIFIED WITH:     WILSON A AT 0400 ON [id number] BY FORSYTH K  URINE CULTURE     Status: None   Collection Time    11/28/12  4:20 PM      Result Value Range Status   Specimen Description URINE, CATHETERIZED   Final   Special Requests NONE   Final   Culture  Setup Time 11/28/2012 18:45   Final   Colony Count NO GROWTH   Final   Culture NO GROWTH    Final   Report Status 11/29/2012 FINAL   Final  CULTURE, BLOOD (ROUTINE X 2)     Status: None   Collection Time    11/29/12  8:24 AM      Result Value Range Status   Specimen Description BLOOD RIGHT WRIST   Final   Special Requests BOTTLES DRAWN AEROBIC ONLY 4CC   Final   Culture NO GROWTH 5 DAYS   Final   Report Status 12/04/2012 FINAL   Final  CULTURE, BLOOD (ROUTINE X 2)     Status: None   Collection Time    11/29/12  8:24 AM      Result Value Range Status   Specimen Description BLOOD RIGHT HAND   Final   Special Requests BOTTLES DRAWN AEROBIC AND ANAEROBIC 6CC   Final   Culture NO GROWTH 5 DAYS   Final   Report Status 12/04/2012 FINAL   Final  CULTURE, RESPIRATORY (NON-EXPECTORATED)     Status: None   Collection Time    11/29/12  2:37 PM      Result Value Range Status   Specimen Description TRACHEAL ASPIRATE   Final   Special Requests NONE  Final   Gram Stain     Final   Value: ABUNDANT WBC PRESENT, PREDOMINANTLY PMN     NO SQUAMOUS EPITHELIAL CELLS SEEN     FEW GRAM NEGATIVE RODS   Culture ABUNDANT ESCHERICHIA COLI   Final   Report Status 12/02/2012 FINAL   Final   Organism ID, Bacteria ESCHERICHIA COLI   Final   Medical History: Past Medical History  Diagnosis Date  . Fibromyalgia   . Asthma   . DJD (degenerative joint disease)   . Chronic pain   . Depression   . Anxiety   . Panic attack   . Sleep disorder   . DJD (degenerative joint disease)    Medications:  Scheduled:  . aztreonam  1 g Intravenous Q8H  . budesonide-formoterol  2 puff Inhalation BID  . buPROPion  150 mg Oral Daily  . DULoxetine  60 mg Oral Daily  . enoxaparin (LOVENOX) injection  40 mg Subcutaneous Q24H  . ipratropium  0.5 mg Nebulization Q4H  . levalbuterol  0.63 mg Nebulization Q4H  . montelukast  10 mg Oral Daily  . nicotine  21 mg Transdermal Daily  . olopatadine  1 drop Both Eyes BID  . pantoprazole  40 mg Oral Daily  . [COMPLETED] potassium chloride  40 mEq Oral BID  .  predniSONE  40 mg Oral Q breakfast  . sodium chloride  3 mL Intravenous Q12H  . vancomycin  1,000 mg Intravenous Q8H  . [DISCONTINUED] methylPREDNISolone (SOLU-MEDROL) injection  60 mg Intravenous Q12H   Assessment: 57yo obese female with good renal fxn.  Estimated Creatinine Clearance: 92.8 ml/min (by C-G formula based on Cr of 0.55). Pt c/o persistent cough and SOB.  Pt is afebrile now and no leukocytosis.   E.Coli sputum culture resistant to Cipro (was on Levaquin). Significant PCN allergy noted.  Antibiotics:  Levofloxacin 3/31 >> 4/5 Vancomycin 4/2 >>  4/9 Aztreonam 4/5 >>  Goal of Therapy:  Vancomycin trough level 15-20 mcg/ml Eradicate infection.  Plan:  Continue Vancomycin 1gm IV q8hrs (last dose scheduled for 4/9) continue Aztreonam 1gm IV every 8 hours (duration per MD) Monitor labs, renal fxn, and cultures Duration of therapy per MD  Valrie Hart A 12/05/2012,8:13 AM

## 2012-12-06 ENCOUNTER — Other Ambulatory Visit: Payer: Self-pay | Admitting: *Deleted

## 2012-12-06 ENCOUNTER — Telehealth: Payer: Self-pay | Admitting: Nurse Practitioner

## 2012-12-06 DIAGNOSIS — J449 Chronic obstructive pulmonary disease, unspecified: Secondary | ICD-10-CM | POA: Diagnosis not present

## 2012-12-06 DIAGNOSIS — J9620 Acute and chronic respiratory failure, unspecified whether with hypoxia or hypercapnia: Secondary | ICD-10-CM | POA: Diagnosis not present

## 2012-12-06 DIAGNOSIS — J189 Pneumonia, unspecified organism: Secondary | ICD-10-CM | POA: Diagnosis not present

## 2012-12-06 DIAGNOSIS — G9341 Metabolic encephalopathy: Secondary | ICD-10-CM | POA: Diagnosis not present

## 2012-12-06 LAB — BASIC METABOLIC PANEL
BUN: 10 mg/dL (ref 6–23)
Chloride: 97 mEq/L (ref 96–112)
GFR calc Af Amer: >90 mL/min (ref >90)
GFR calc non Af Amer: >90 mL/min (ref >90)
Potassium: 3.4 mEq/L — ABNORMAL LOW (ref 3.5–5.1)
Sodium: 140 mEq/L (ref 135–145)

## 2012-12-06 LAB — CBC
MCHC: 31.3 g/dL (ref 30.0–36.0)
Platelets: 204 10*3/uL (ref 150–400)
RDW: 14.6 % (ref 11.5–15.5)
WBC: 12.8 10*3/uL — ABNORMAL HIGH (ref 4.0–10.5)

## 2012-12-06 MED ORDER — IBUPROFEN 800 MG PO TABS: 800.0000 mg | ORAL_TABLET | Freq: Four times a day (QID) | ORAL | Status: DC | PRN

## 2012-12-06 MED ORDER — FLUCONAZOLE 100 MG PO TABS: 100.0000 mg | ORAL_TABLET | Freq: Every day | ORAL | Status: DC

## 2012-12-06 MED ORDER — PANTOPRAZOLE SODIUM 40 MG PO TBEC: 40.0000 mg | DELAYED_RELEASE_TABLET | Freq: Every day | ORAL | Status: DC

## 2012-12-06 MED ORDER — MAGIC MOUTHWASH: 15.0000 mL | Freq: Two times a day (BID) | ORAL | Status: DC

## 2012-12-06 MED ORDER — PREDNISONE 10 MG PO TABS: ORAL_TABLET | ORAL | Status: DC

## 2012-12-06 MED ORDER — ZOLPIDEM TARTRATE 10 MG PO TABS: 10.0000 mg | ORAL_TABLET | Freq: Every day | ORAL | Status: DC

## 2012-12-06 MED ORDER — ALBUTEROL SULFATE HFA 108 (90 BASE) MCG/ACT IN AERS: 2.0000 | INHALATION_SPRAY | Freq: Four times a day (QID) | RESPIRATORY_TRACT | Status: DC | PRN

## 2012-12-06 MED ORDER — BUDESONIDE-FORMOTEROL FUMARATE 160-4.5 MCG/ACT IN AERO: 2.0000 | INHALATION_SPRAY | Freq: Two times a day (BID) | RESPIRATORY_TRACT | Status: DC

## 2012-12-06 MED ORDER — DIAZEPAM 5 MG PO TABS: 5.0000 mg | ORAL_TABLET | Freq: Three times a day (TID) | ORAL | Status: DC

## 2012-12-06 MED ORDER — ATORVASTATIN CALCIUM 20 MG PO TABS: 20.0000 mg | ORAL_TABLET | Freq: Every day | ORAL | Status: DC

## 2012-12-06 NOTE — Telephone Encounter (Signed)
Patient last seen in office on 11-07-12 for chronic health check. Diazepam last filled 10-30-12. Ambien last filled 10-30-12. IBU last filled on 10-18-12 for #30. If approved please have [facility] [place]. Thank you

## 2012-12-06 NOTE — Telephone Encounter (Signed)
Please advise 

## 2012-12-06 NOTE — Telephone Encounter (Signed)
Can only give one at a time. If is using everyday NTBS to discuss

## 2012-12-06 NOTE — Telephone Encounter (Signed)
Call in [employer]

## 2012-12-06 NOTE — Telephone Encounter (Signed)
Called in.

## 2012-12-06 NOTE — Discharge Summary (Signed)
Physician Discharge Summary  KARINNA BEADLES ZOX:[id number] DOB: [birth year] DOA: 11/27/2012  PCP: Bennie Pierini, FNP  Admit date: 11/27/2012 Discharge date: 12/06/2012  Time spent: 45 minutes  Recommendations for Outpatient Follow-up:  1. Patient has been set up with home health services. 2. Followup with primary care physician in 2 weeks. 3. Follow up with pulmonologist in 2 weeks.  Discharge Diagnoses:  Principal Problem:   COPD (chronic obstructive pulmonary disease) Active Problems:   Smoking   Chronic pain disorder   Metabolic encephalopathy   Acute-on-chronic respiratory failure   CAP (community acquired pneumonia)   Pneumonia due to Escherichia coli   Discharge Condition: improved  Diet recommendation: low salt  Filed Weights   11/30/12 0500 12/01/12 0500 12/02/12 0600  Weight: 110.7 kg (244 lb 0.8 oz) 110.8 kg (244 lb 4.3 oz) 106.4 kg (234 lb 9.1 oz)    History of present illness:  Bethany Villa is a 57 y.o. female with known COPD and chronic respiratory failure on home oxygen. She continues to smoke. She reports that she was short of breath for the past few weeks, but worse over the past 3 days. 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 Since that time, she has had increased coughing, runny nose, shortness of breath and wheezing. She is not sure whether she is having fevers. She has no chest pain. No vomiting diarrhea or dysuria. She was evaluated in the emergency room and was found to have a COPD exacerbation. She's been referred for admission.   Hospital Course:  This is a 57 year old female with a history of chronic respiratory failure on home oxygen, tobacco abuse and COPD who presents to the emergency room with shortness of breath. She was admitted to the medical floor for a COPD exacerbation and acute on chronic respiratory failure. Unfortunately her clinical status  began to deteriorate. She developed acute encephalopathy and had severely elevated PCO2. She required transfer to the ICU for BiPAP therapy. When her ABG/mental status did not improve on BiPAP, she was intubated for mechanical ventilation. The patient remained intubated from 4/24/3. She had improvement in her respiratory status and was successfully extubated. Chest x-ray indicated a pneumonia she was started on antibiotics. Tracheal aspirate was positive for Escherichia coli and antibiotics were adjusted accordingly. She is currently afebrile and her respiratory status has significantly improved back to baseline. She is placed on a prednisone taper. The patient is motivated to quit smoking at this point and she was congratulated. She'll followup with her primary care physician as well as her pulmonologist in 2 weeks.  Procedures:  Intubation 4/1 - 4/3  Consultations:  Pulmonology, Dr. Juanetta Gosling  Discharge Exam: Filed Vitals:   12/06/12 0500 12/06/12 0727 12/06/12 0749 12/06/12 1120  BP: 131/76     Pulse: 101     Temp: 98.4 F (36.9 C)     TempSrc: Oral     Resp: 20     Height:      Weight:      SpO2: 98% 94% 94% 95%    General: No acute distress Cardiovascular: S1, S2, regular rate and rhythm Respiratory: Mild expiratory wheezes bilaterally, good air movement.  Discharge Instructions  Discharge Orders   Future Orders Complete By Expires     Call MD for:  difficulty breathing, headache or visual disturbances  As directed     Call MD for:  temperature >100.4  As  directed     Diet - low sodium heart healthy  As directed     Face-to-face encounter (required for Medicare/Medicaid patients)  As directed     Comments:      I [name],[name] certify that this patient is under my care and that I, or a nurse practitioner or physician's assistant working with me, had a face-to-face encounter that meets the physician face-to-face encounter requirements with this patient on 12/06/2012. The  encounter with the patient was in whole, or in part for the following medical condition(s) which is the primary reason for home health care (List medical condition): admitted with acute on chronic respiratory failure and copd exacerbation.  Would benefit from COPD program for disease management.    Questions:      The encounter with the patient was in whole, or in part, for the following medical condition, which is the primary reason for home health care:  copd exacerbation    I certify that, based on my findings, the following services are medically necessary home health services:  Nursing    My clinical findings support the need for the above services:  Shortness of breath with activity    Further, I certify that my clinical findings support that this patient is homebound due to:  Shortness of Breath with activity    Reason for Medically Necessary Home Health Services:  Skilled Nursing- Teaching of Disease Process/Symptom Management    Home Health  As directed     Questions:      To provide the following care/treatments:  Home Health Aide    RN    Social work    Increase activity slowly  As directed         Medication List    TAKE these medications       acetaminophen 325 MG tablet  Commonly known as:  TYLENOL  Take 325 mg by mouth 4 (four) times daily as needed for pain (Taken with Oxycodone 10 mg as needed for pain).     albuterol (2.5 MG/3ML) 0.083% nebulizer solution  Commonly known as:  PROVENTIL  Take 2.5 mg by nebulization every 6 (six) hours as needed. For shortness of breath     PROAIR HFA 108 (90 BASE) MCG/ACT inhaler  Generic drug:  albuterol  Inhale 2 puffs into the lungs every 6 (six) hours as needed for wheezing or shortness of breath.     budesonide-formoterol 160-4.5 MCG/ACT inhaler  Commonly known as:  SYMBICORT  Inhale 2 puffs into the lungs 2 (two) times daily.     buPROPion 150 MG 24 hr tablet  Commonly known as:  WELLBUTRIN XL  Take 150 mg by mouth every  morning.     diazepam 5 MG tablet  Commonly known as:  VALIUM  Take 5 mg by mouth every 8 (eight) hours as needed. For anxiety     diclofenac 75 MG EC tablet  Commonly known as:  VOLTAREN  Take 75 mg by mouth 2 (two) times daily.     diclofenac sodium 1 % Gel  Commonly known as:  VOLTAREN  Apply 1 application topically 4 (four) times daily as needed. To knee for pain     DULoxetine 60 MG capsule  Commonly known as:  CYMBALTA  Take 60 mg by mouth daily.     fluconazole 100 MG tablet  Commonly known as:  DIFLUCAN  Take 1 tablet (100 mg total) by mouth daily.     fluticasone 50 MCG/ACT nasal spray  Commonly  known as:  FLONASE  Place 2 sprays into the nose daily.     levalbuterol 45 MCG/ACT inhaler  Commonly known as:  XOPENEX HFA  Inhale 1-2 puffs into the lungs every 4 (four) hours as needed. For shortness of breath     magic mouthwash Soln  Take 15 mLs by mouth 2 (two) times daily.     montelukast 10 MG tablet  Commonly known as:  SINGULAIR  Take 10 mg by mouth daily.     olopatadine 0.1 % ophthalmic solution  Commonly known as:  PATANOL  Place 1 drop into both eyes 2 (two) times daily.     Oxycodone HCl 10 MG Tabs  Take 10 mg by mouth 4 (four) times daily as needed (for pain).     pantoprazole 40 MG tablet  Commonly known as:  PROTONIX  Take 1 tablet (40 mg total) by mouth daily.     predniSONE 10 MG tablet  Commonly known as:  DELTASONE  Take 40mg  po daily for 3 days, then 30mg  po daily for 3 days then 20mg  po daily for 3 days then 10mg  po daily for 3 days then stop.     tiotropium 18 MCG inhalation capsule  Commonly known as:  SPIRIVA  Place 18 mcg into inhaler and inhale daily.     zolpidem 10 MG tablet  Commonly known as:  AMBIEN  Take 10 mg by mouth at bedtime as needed. For sleep           Follow-up Information   Follow up with Bennie Pierini, FNP. Schedule an appointment as soon as possible for a visit in 2 weeks.   Contact information:    [address] [place] [address] [phone]       Follow up with Sandrea Hughs, MD. Schedule an appointment as soon as possible for a visit in 2 weeks.   Contact information:   [address] [place] [address] [phone]        The results of significant diagnostics from this hospitalization (including imaging, microbiology, ancillary and laboratory) are listed below for reference.    Significant Diagnostic Studies: Dg Chest Port 1 View  11/30/2012  *RADIOLOGY REPORT*  Clinical Data: Respiratory failure, on ventilator  PORTABLE CHEST - 1 VIEW  Comparison: Portable chest x-ray of 11/29/2012  Findings: The tip of the endotracheal tube is approximately 4.4 cm above the carina.  Bibasilar atelectasis remains.  Mild cardiomegaly is stable.  No bony abnormality is noted.  IMPRESSION: Endotracheal tip 4.4 cm above the carina.  Mild bibasilar atelectasis remains.   Original Report Authenticated By: Dwyane Dee, M.D.    Dg Chest Port 1 View  11/29/2012  *RADIOLOGY REPORT*  Clinical Data: Respiratory distress.  Ventilator  PORTABLE CHEST - 1 VIEW  Comparison: 11/28/2012  Findings: Endotracheal tube in good position.  NG tube has been pulled back and is now at the level of the aortic arch.  This should be advanced into the stomach.  Mild progression of right lower lobe airspace disease.  Negative for heart failure.    No significant effusion.  Underlying COPD.  IMPRESSION: NG tube has been pulled back and the tip is now at the level of the aortic arch.  This should be advanced into the stomach.  Endotracheal tube in good position.  Mild progression of right lower lobe atelectasis/infiltrate.   Original Report Authenticated By: Janeece Riggers, M.D.    Dg Chest Port 1 View  11/28/2012  *RADIOLOGY REPORT*  Clinical Data: Respiratory failure, intubation  PORTABLE CHEST - 1 VIEW  Comparison: 11/27/2012; 11/15/2011  Findings:  Grossly unchanged cardiac silhouette and mediastinal contours given  patient rotation.  Endotracheal tube overlies the tracheal air column with tip superior to the carina.  An enteric tube terminates below the left hemidiaphragm.  Grossly unchanged bibasilar heterogeneous opacities.  No new focal airspace opacity.  No evidence of pulmonary edema, pleural effusion or pneumothorax. Grossly unchanged bones.  IMPRESSION:  Appropriately positioned support apparatus as above.  No pneumothorax.   Original Report Authenticated By: Tacey Ruiz, MD    Dg Chest Port 1 View  11/27/2012  *RADIOLOGY REPORT*  Clinical Data: Cough.  PORTABLE CHEST - 1 VIEW  Comparison: 11/15/2011  Findings: 1545 hours.  Low volume film. The cardiopericardial silhouette is enlarged.  No focal airspace consolidation or pulmonary edema.  No pleural effusion. Imaged bony structures of the thorax are intact. Telemetry leads overlie the chest.  IMPRESSION: Low volume film without acute cardiopulmonary findings.   Original Report Authenticated By: Kennith Center, M.D.     Microbiology: Recent Results (from the past 240 hour(s))  MRSA PCR SCREENING     Status: Abnormal   Collection Time    11/28/12  2:08 AM      Result Value Range Status   MRSA by PCR POSITIVE (*) NEGATIVE Final   Comment:            The GeneXpert MRSA Assay (FDA     approved for NASAL specimens     only), is one component of a     comprehensive MRSA colonization     surveillance program. It is not     intended to diagnose MRSA     infection nor to guide or     monitor treatment for     MRSA infections.     RESULT CALLED TO, READ BACK BY AND VERIFIED WITH:     WILSON A AT 0400 ON [id number] BY FORSYTH K  URINE CULTURE     Status: None   Collection Time    11/28/12  4:20 PM      Result Value Range Status   Specimen Description URINE, CATHETERIZED   Final   Special Requests NONE   Final   Culture  Setup Time 11/28/2012 18:45   Final   Colony Count NO GROWTH   Final   Culture NO GROWTH   Final   Report Status 11/29/2012 FINAL    Final  CULTURE, BLOOD (ROUTINE X 2)     Status: None   Collection Time    11/29/12  8:24 AM      Result Value Range Status   Specimen Description BLOOD RIGHT WRIST   Final   Special Requests BOTTLES DRAWN AEROBIC ONLY 4CC   Final   Culture NO GROWTH 5 DAYS   Final   Report Status 12/04/2012 FINAL   Final  CULTURE, BLOOD (ROUTINE X 2)     Status: None   Collection Time    11/29/12  8:24 AM      Result Value Range Status   Specimen Description BLOOD RIGHT HAND   Final   Special Requests BOTTLES DRAWN AEROBIC AND ANAEROBIC 6CC   Final   Culture NO GROWTH 5 DAYS   Final   Report Status 12/04/2012 FINAL   Final  CULTURE, RESPIRATORY (NON-EXPECTORATED)     Status: None   Collection Time    11/29/12  2:37 PM      Result Value  Range Status   Specimen Description TRACHEAL ASPIRATE   Final   Special Requests NONE   Final   Gram Stain     Final   Value: ABUNDANT WBC PRESENT, PREDOMINANTLY PMN     NO SQUAMOUS EPITHELIAL CELLS SEEN     FEW GRAM NEGATIVE RODS   Culture ABUNDANT ESCHERICHIA COLI   Final   Report Status 12/02/2012 FINAL   Final   Organism ID, Bacteria ESCHERICHIA COLI   Final     Labs: Basic Metabolic Panel:  Recent Labs Lab 11/30/12 0440 12/01/12 0504 12/04/12 0407 12/06/12 0526  NA 138 140 139 140  K 3.3* 3.2* 3.3* 3.4*  CL 95* 98 95* 97  CO2 35* 32 36* 36*  GLUCOSE 126* 121* 108* 82  BUN 15 15 10 10   CREATININE 0.55 0.49* 0.55 0.54  CALCIUM 8.6 8.7 8.6 8.5   Liver Function Tests: No results found for this basename: AST, ALT, ALKPHOS, BILITOT, PROT, ALBUMIN,  in the last 168 hours No results found for this basename: LIPASE, AMYLASE,  in the last 168 hours No results found for this basename: AMMONIA,  in the last 168 hours CBC:  Recent Labs Lab 11/30/12 0440 12/01/12 0504 12/04/12 0407 12/06/12 0526  WBC 7.0 5.1 7.1 12.8*  HGB 13.5 13.7 14.4 14.4  HCT 43.6 43.9 46.4* 46.0  MCV 90.8 89.2 89.6 89.8  PLT 168 169 156 204   Cardiac Enzymes: No results  found for this basename: CKTOTAL, CKMB, CKMBINDEX, TROPONINI,  in the last 168 hours BNP: BNP (last 3 results)  Recent Labs  11/27/12 1534  PROBNP 228.4*   CBG:  Recent Labs Lab 11/30/12 2052 12/01/12 0017 12/01/12 0801 12/01/12 1140 12/01/12 1625  GLUCAP 116* 99 122* 163* 120*       Signed:  [name],[name]  [facility] 12/06/2012, 1:29 PM

## 2012-12-06 NOTE — Progress Notes (Signed)
Pt. D/c instructions provided, IV removed, and prescriptions reviewed. Pt. Stable for d/c at this time.

## 2012-12-07 NOTE — Telephone Encounter (Signed)
Can we call in one please

## 2012-12-07 NOTE — Telephone Encounter (Signed)
Done yesterday but can only give one at a time. If using daily needs to be seen to discuss.

## 2012-12-08 DIAGNOSIS — G8929 Other chronic pain: Secondary | ICD-10-CM | POA: Diagnosis not present

## 2012-12-08 DIAGNOSIS — IMO0001 Reserved for inherently not codable concepts without codable children: Secondary | ICD-10-CM | POA: Diagnosis not present

## 2012-12-08 DIAGNOSIS — F329 Major depressive disorder, single episode, unspecified: Secondary | ICD-10-CM | POA: Diagnosis not present

## 2012-12-08 DIAGNOSIS — J441 Chronic obstructive pulmonary disease with (acute) exacerbation: Secondary | ICD-10-CM | POA: Diagnosis not present

## 2012-12-08 DIAGNOSIS — M159 Polyosteoarthritis, unspecified: Secondary | ICD-10-CM | POA: Diagnosis not present

## 2012-12-08 DIAGNOSIS — J45909 Unspecified asthma, uncomplicated: Secondary | ICD-10-CM | POA: Diagnosis not present

## 2012-12-11 DIAGNOSIS — J45909 Unspecified asthma, uncomplicated: Secondary | ICD-10-CM | POA: Diagnosis not present

## 2012-12-11 DIAGNOSIS — IMO0001 Reserved for inherently not codable concepts without codable children: Secondary | ICD-10-CM | POA: Diagnosis not present

## 2012-12-11 DIAGNOSIS — M159 Polyosteoarthritis, unspecified: Secondary | ICD-10-CM | POA: Diagnosis not present

## 2012-12-11 DIAGNOSIS — G8929 Other chronic pain: Secondary | ICD-10-CM | POA: Diagnosis not present

## 2012-12-11 DIAGNOSIS — J441 Chronic obstructive pulmonary disease with (acute) exacerbation: Secondary | ICD-10-CM | POA: Diagnosis not present

## 2012-12-11 DIAGNOSIS — F329 Major depressive disorder, single episode, unspecified: Secondary | ICD-10-CM | POA: Diagnosis not present

## 2012-12-12 ENCOUNTER — Telehealth: Payer: Self-pay | Admitting: Nurse Practitioner

## 2012-12-12 DIAGNOSIS — J45909 Unspecified asthma, uncomplicated: Secondary | ICD-10-CM | POA: Diagnosis not present

## 2012-12-12 DIAGNOSIS — M159 Polyosteoarthritis, unspecified: Secondary | ICD-10-CM | POA: Diagnosis not present

## 2012-12-12 DIAGNOSIS — F329 Major depressive disorder, single episode, unspecified: Secondary | ICD-10-CM | POA: Diagnosis not present

## 2012-12-12 DIAGNOSIS — IMO0001 Reserved for inherently not codable concepts without codable children: Secondary | ICD-10-CM | POA: Diagnosis not present

## 2012-12-12 DIAGNOSIS — G8929 Other chronic pain: Secondary | ICD-10-CM | POA: Diagnosis not present

## 2012-12-12 DIAGNOSIS — J441 Chronic obstructive pulmonary disease with (acute) exacerbation: Secondary | ICD-10-CM | POA: Diagnosis not present

## 2012-12-12 MED ORDER — NYSTATIN 100000 UNIT/ML MT SUSP: 500000.0000 [IU] | Freq: Four times a day (QID) | OROMUCOSAL | Status: DC

## 2012-12-12 NOTE — Telephone Encounter (Signed)
Please advise 

## 2012-12-12 NOTE — Telephone Encounter (Signed)
Nystatin Rx sent to pharmacy.

## 2012-12-13 NOTE — Telephone Encounter (Signed)
Pt aware nystatin sent to pharmacy

## 2012-12-14 DIAGNOSIS — J45909 Unspecified asthma, uncomplicated: Secondary | ICD-10-CM | POA: Diagnosis not present

## 2012-12-14 DIAGNOSIS — F329 Major depressive disorder, single episode, unspecified: Secondary | ICD-10-CM | POA: Diagnosis not present

## 2012-12-14 DIAGNOSIS — J441 Chronic obstructive pulmonary disease with (acute) exacerbation: Secondary | ICD-10-CM | POA: Diagnosis not present

## 2012-12-14 DIAGNOSIS — M159 Polyosteoarthritis, unspecified: Secondary | ICD-10-CM | POA: Diagnosis not present

## 2012-12-14 DIAGNOSIS — G8929 Other chronic pain: Secondary | ICD-10-CM | POA: Diagnosis not present

## 2012-12-14 DIAGNOSIS — IMO0001 Reserved for inherently not codable concepts without codable children: Secondary | ICD-10-CM | POA: Diagnosis not present

## 2012-12-19 DIAGNOSIS — G8929 Other chronic pain: Secondary | ICD-10-CM | POA: Diagnosis not present

## 2012-12-19 DIAGNOSIS — F329 Major depressive disorder, single episode, unspecified: Secondary | ICD-10-CM | POA: Diagnosis not present

## 2012-12-19 DIAGNOSIS — J441 Chronic obstructive pulmonary disease with (acute) exacerbation: Secondary | ICD-10-CM | POA: Diagnosis not present

## 2012-12-19 DIAGNOSIS — J45909 Unspecified asthma, uncomplicated: Secondary | ICD-10-CM | POA: Diagnosis not present

## 2012-12-19 DIAGNOSIS — IMO0001 Reserved for inherently not codable concepts without codable children: Secondary | ICD-10-CM | POA: Diagnosis not present

## 2012-12-19 DIAGNOSIS — M159 Polyosteoarthritis, unspecified: Secondary | ICD-10-CM | POA: Diagnosis not present

## 2012-12-21 ENCOUNTER — Ambulatory Visit: Payer: Self-pay | Admitting: Nurse Practitioner

## 2012-12-22 ENCOUNTER — Other Ambulatory Visit: Payer: Self-pay

## 2012-12-22 MED ORDER — LEVALBUTEROL TARTRATE 45 MCG/ACT IN AERO: 1.0000 | INHALATION_SPRAY | RESPIRATORY_TRACT | Status: DC | PRN

## 2012-12-22 MED ORDER — ALBUTEROL SULFATE (2.5 MG/3ML) 0.083% IN NEBU: INHALATION_SOLUTION | RESPIRATORY_TRACT | Status: DC

## 2012-12-22 NOTE — Telephone Encounter (Signed)
.  .  .        xx

## 2012-12-26 ENCOUNTER — Telehealth: Payer: Self-pay | Admitting: Nurse Practitioner

## 2012-12-26 NOTE — Telephone Encounter (Signed)
APPT MADE

## 2012-12-27 DIAGNOSIS — J441 Chronic obstructive pulmonary disease with (acute) exacerbation: Secondary | ICD-10-CM | POA: Diagnosis not present

## 2012-12-27 DIAGNOSIS — G8929 Other chronic pain: Secondary | ICD-10-CM | POA: Diagnosis not present

## 2012-12-27 DIAGNOSIS — J45909 Unspecified asthma, uncomplicated: Secondary | ICD-10-CM | POA: Diagnosis not present

## 2012-12-27 DIAGNOSIS — F329 Major depressive disorder, single episode, unspecified: Secondary | ICD-10-CM | POA: Diagnosis not present

## 2012-12-27 DIAGNOSIS — M159 Polyosteoarthritis, unspecified: Secondary | ICD-10-CM | POA: Diagnosis not present

## 2012-12-27 DIAGNOSIS — IMO0001 Reserved for inherently not codable concepts without codable children: Secondary | ICD-10-CM | POA: Diagnosis not present

## 2012-12-28 ENCOUNTER — Ambulatory Visit (INDEPENDENT_AMBULATORY_CARE_PROVIDER_SITE_OTHER): Payer: Medicare Other | Admitting: Nurse Practitioner

## 2012-12-28 ENCOUNTER — Encounter: Payer: Self-pay | Admitting: Nurse Practitioner

## 2012-12-28 ENCOUNTER — Ambulatory Visit (INDEPENDENT_AMBULATORY_CARE_PROVIDER_SITE_OTHER): Payer: Medicare Other

## 2012-12-28 ENCOUNTER — Telehealth: Payer: Self-pay | Admitting: Family Medicine

## 2012-12-28 VITALS — BP 127/84 | HR 112 | Temp 97.1°F | Wt 230.0 lb

## 2012-12-28 DIAGNOSIS — G4700 Insomnia, unspecified: Secondary | ICD-10-CM | POA: Diagnosis not present

## 2012-12-28 DIAGNOSIS — F411 Generalized anxiety disorder: Secondary | ICD-10-CM | POA: Diagnosis not present

## 2012-12-28 DIAGNOSIS — J441 Chronic obstructive pulmonary disease with (acute) exacerbation: Secondary | ICD-10-CM

## 2012-12-28 DIAGNOSIS — F32A Depression, unspecified: Secondary | ICD-10-CM

## 2012-12-28 DIAGNOSIS — F329 Major depressive disorder, single episode, unspecified: Secondary | ICD-10-CM

## 2012-12-28 DIAGNOSIS — J189 Pneumonia, unspecified organism: Secondary | ICD-10-CM

## 2012-12-28 MED ORDER — ZOLPIDEM TARTRATE 10 MG PO TABS: 10.0000 mg | ORAL_TABLET | Freq: Every day | ORAL | Status: DC

## 2012-12-28 MED ORDER — DIAZEPAM 5 MG PO TABS: 5.0000 mg | ORAL_TABLET | Freq: Two times a day (BID) | ORAL | Status: DC | PRN

## 2012-12-28 MED ORDER — DULOXETINE HCL 60 MG PO CPEP: 60.0000 mg | ORAL_CAPSULE | Freq: Every day | ORAL | Status: DC

## 2012-12-28 MED ORDER — BUPROPION HCL ER (XL) 150 MG PO TB24: 150.0000 mg | ORAL_TABLET | Freq: Every morning | ORAL | Status: DC

## 2012-12-28 NOTE — Telephone Encounter (Signed)
rx sent- patient aware  

## 2012-12-28 NOTE — Telephone Encounter (Signed)
RX sent to pharmacy for cymbalta and wellbutrin

## 2012-12-28 NOTE — Patient Instructions (Signed)

## 2012-12-28 NOTE — Progress Notes (Addendum)
  Subjective:    Patient ID: Bethany Villa, female    DOB: Nov 30, 1955, 57 y.o.   MRN: [id number]  HPI- Patient was in the hospital at [facility] end of March with pneumonia. She is doing much better. No SOB other than what is normal for her. She was in the hospital for almost 2 weeks.  Depression Patients antidepressants were stopped when in hospital and patient says she is starting to get very anxious. Needs to go back on Cymbalta and wellbutrin.   Review of Systems  Constitutional: Negative for fever and chills.  HENT: Negative for ear pain, congestion, rhinorrhea, sneezing, postnasal drip and sinus pressure.   Eyes: Positive for redness.  Respiratory: Positive for cough (slight nonproductive ).   Cardiovascular: Negative for chest pain, palpitations and leg swelling.  Gastrointestinal: Negative.   Genitourinary: Negative.   Psychiatric/Behavioral: Negative.        Objective:   Physical Exam  Constitutional: She is oriented to person, place, and time. She appears well-developed and well-nourished.  HENT:  Head: Normocephalic.  Right Ear: External ear normal.  Left Ear: External ear normal.  Nose: Nose normal.  Mouth/Throat: Oropharynx is clear and moist.  Eyes: EOM are normal.  Neck: Normal range of motion. Neck supple.  Cardiovascular: Normal rate, regular rhythm and normal heart sounds.   Pulmonary/Chest: She has wheezes (insp and exp ).  Diminished in bil bases  Abdominal: Soft. Bowel sounds are normal.  Lymphadenopathy:    She has no cervical adenopathy.  Neurological: She is alert and oriented to person, place, and time.  Psychiatric: She has a normal mood and affect. Her behavior is normal. Judgment and thought content normal.   BP 127/84  Pulse 112  Temp(Src) 97.1 F (36.2 C) (Oral)  Wt 230 lb (104.327 kg)  BMI 40.75 kg/m2  SpO2 91% Chest x-ray- Resolved Pneumonia-Preliminary reading by Paulene Floor, FNP  [facility]        Assessment & Plan:  1. CAP (community  acquired pneumonia)S/P hopitalization Continue mucinex as needed [facility] records reviewed - DG Chest 2 View; Future  2. Depression Stress management Back on meds as Rx below - DULoxetine (CYMBALTA) 60 MG capsule; Take 1 capsule (60 mg total) by mouth daily.  Dispense: 30 capsule; Refill: 3 - buPROPion (WELLBUTRIN XL) 150 MG 24 hr tablet; Take 1 tablet (150 mg total) by mouth every morning.  Dispense: 30 tablet; Refill: 3  3. GAD (generalized anxiety disorder)  - diazepam (VALIUM) 5 MG tablet; Take 1 tablet (5 mg total) by mouth every 12 (twelve) hours as needed for anxiety. For anxiety  Dispense: 60 tablet; Refill: 0  4. Insomnia Bedtime ritual - zolpidem (AMBIEN) 10 MG tablet; Take 1 tablet (10 mg total) by mouth at bedtime. For sleep  Dispense: 30 tablet; Refill: 0  Mary-Margaret Daphine Deutscher, FNP

## 2012-12-28 NOTE — Telephone Encounter (Signed)
Do you want her to try the Effexor or stay on the cymbalta she has been on? Because you wrote the RX for Effexor she has been on cymbalta

## 2012-12-31 ENCOUNTER — Other Ambulatory Visit: Payer: Self-pay | Admitting: Nurse Practitioner

## 2013-01-01 DIAGNOSIS — M5380 Other specified dorsopathies, site unspecified: Secondary | ICD-10-CM | POA: Diagnosis not present

## 2013-01-01 DIAGNOSIS — M5137 Other intervertebral disc degeneration, lumbosacral region: Secondary | ICD-10-CM | POA: Diagnosis not present

## 2013-01-01 DIAGNOSIS — M47817 Spondylosis without myelopathy or radiculopathy, lumbosacral region: Secondary | ICD-10-CM | POA: Diagnosis not present

## 2013-01-01 DIAGNOSIS — Z79899 Other long term (current) drug therapy: Secondary | ICD-10-CM | POA: Diagnosis not present

## 2013-01-01 NOTE — Telephone Encounter (Signed)
Please Advise. Thank you

## 2013-01-02 ENCOUNTER — Institutional Professional Consult (permissible substitution): Payer: Medicare Other | Admitting: Internal Medicine

## 2013-01-02 DIAGNOSIS — J441 Chronic obstructive pulmonary disease with (acute) exacerbation: Secondary | ICD-10-CM | POA: Diagnosis not present

## 2013-01-02 DIAGNOSIS — J45909 Unspecified asthma, uncomplicated: Secondary | ICD-10-CM | POA: Diagnosis not present

## 2013-01-02 DIAGNOSIS — G8929 Other chronic pain: Secondary | ICD-10-CM | POA: Diagnosis not present

## 2013-01-02 DIAGNOSIS — IMO0001 Reserved for inherently not codable concepts without codable children: Secondary | ICD-10-CM | POA: Diagnosis not present

## 2013-01-02 DIAGNOSIS — F329 Major depressive disorder, single episode, unspecified: Secondary | ICD-10-CM | POA: Diagnosis not present

## 2013-01-02 DIAGNOSIS — M159 Polyosteoarthritis, unspecified: Secondary | ICD-10-CM | POA: Diagnosis not present

## 2013-01-04 ENCOUNTER — Other Ambulatory Visit: Payer: Self-pay | Admitting: Nurse Practitioner

## 2013-01-08 ENCOUNTER — Telehealth: Payer: Self-pay | Admitting: Nurse Practitioner

## 2013-01-08 NOTE — Telephone Encounter (Signed)
Pt would like rx for cough medicine

## 2013-01-08 NOTE — Telephone Encounter (Signed)
Use OTC delsym or robutussin. Already on hydrocodone that is a codeine direvative.

## 2013-01-09 ENCOUNTER — Other Ambulatory Visit: Payer: Self-pay | Admitting: Nurse Practitioner

## 2013-01-10 DIAGNOSIS — G8929 Other chronic pain: Secondary | ICD-10-CM | POA: Diagnosis not present

## 2013-01-10 DIAGNOSIS — IMO0001 Reserved for inherently not codable concepts without codable children: Secondary | ICD-10-CM | POA: Diagnosis not present

## 2013-01-10 DIAGNOSIS — J45909 Unspecified asthma, uncomplicated: Secondary | ICD-10-CM | POA: Diagnosis not present

## 2013-01-10 DIAGNOSIS — M159 Polyosteoarthritis, unspecified: Secondary | ICD-10-CM | POA: Diagnosis not present

## 2013-01-10 DIAGNOSIS — J441 Chronic obstructive pulmonary disease with (acute) exacerbation: Secondary | ICD-10-CM | POA: Diagnosis not present

## 2013-01-10 DIAGNOSIS — F329 Major depressive disorder, single episode, unspecified: Secondary | ICD-10-CM | POA: Diagnosis not present

## 2013-01-11 NOTE — Telephone Encounter (Signed)
Pt aware to try otc cough med if still needed

## 2013-01-18 ENCOUNTER — Telehealth: Payer: Self-pay | Admitting: Nurse Practitioner

## 2013-01-18 NOTE — Telephone Encounter (Signed)
Either is fine- we usually do 2 liters.

## 2013-01-18 NOTE — Telephone Encounter (Signed)
Spoke with [employer]

## 2013-01-26 ENCOUNTER — Other Ambulatory Visit: Payer: Self-pay | Admitting: Nurse Practitioner

## 2013-01-29 ENCOUNTER — Telehealth: Payer: Self-pay | Admitting: Nurse Practitioner

## 2013-01-29 NOTE — Telephone Encounter (Signed)
NTBS.

## 2013-01-29 NOTE — Telephone Encounter (Signed)
Coughing up green thick stuff she had pneumonia for three weeks last month she just want to make sure she does not need an antibiotic?

## 2013-01-30 ENCOUNTER — Other Ambulatory Visit: Payer: Self-pay | Admitting: Nurse Practitioner

## 2013-01-31 ENCOUNTER — Other Ambulatory Visit: Payer: Self-pay | Admitting: Nurse Practitioner

## 2013-01-31 NOTE — Telephone Encounter (Signed)
[facility] and please call in an antibiotic coughing up green stuff

## 2013-02-01 ENCOUNTER — Telehealth: Payer: Self-pay | Admitting: *Deleted

## 2013-02-01 ENCOUNTER — Other Ambulatory Visit: Payer: Self-pay | Admitting: Nurse Practitioner

## 2013-02-01 DIAGNOSIS — J45909 Unspecified asthma, uncomplicated: Secondary | ICD-10-CM | POA: Diagnosis not present

## 2013-02-01 DIAGNOSIS — IMO0001 Reserved for inherently not codable concepts without codable children: Secondary | ICD-10-CM | POA: Diagnosis not present

## 2013-02-01 DIAGNOSIS — M159 Polyosteoarthritis, unspecified: Secondary | ICD-10-CM | POA: Diagnosis not present

## 2013-02-01 DIAGNOSIS — G8929 Other chronic pain: Secondary | ICD-10-CM | POA: Diagnosis not present

## 2013-02-01 DIAGNOSIS — J441 Chronic obstructive pulmonary disease with (acute) exacerbation: Secondary | ICD-10-CM | POA: Diagnosis not present

## 2013-02-01 DIAGNOSIS — F329 Major depressive disorder, single episode, unspecified: Secondary | ICD-10-CM | POA: Diagnosis not present

## 2013-02-01 NOTE — Telephone Encounter (Signed)
Pt has thick green mucus and wheezing, no fever, has had pneumonia recently. Can we call something in or should she be seen Call tina at [facility] [phone] and pt [phone]

## 2013-02-01 NOTE — Telephone Encounter (Signed)
Pt aware - and has appt for tomorrow

## 2013-02-01 NOTE — Telephone Encounter (Signed)
MED CALLED IN TO PHARM 

## 2013-02-01 NOTE — Telephone Encounter (Signed)
LAST RF 12/31/12. [facility]. LAST OV 12/28/12.

## 2013-02-01 NOTE — Telephone Encounter (Signed)
Really NTBS

## 2013-02-01 NOTE — Telephone Encounter (Signed)
ambien last filled 12/28/12, didn't know how to separate the others from this

## 2013-02-01 NOTE — Telephone Encounter (Signed)
[facility] at [facility] aware. But had to leave message for Snow to call for appt

## 2013-02-01 NOTE — Telephone Encounter (Signed)
jamie B. Is taking care of this

## 2013-02-01 NOTE — Telephone Encounter (Signed)
Please call in valium RX with 2 refills

## 2013-02-02 ENCOUNTER — Ambulatory Visit: Payer: Medicare Other | Admitting: Physician Assistant

## 2013-02-02 ENCOUNTER — Telehealth: Payer: Self-pay | Admitting: Nurse Practitioner

## 2013-02-02 NOTE — Telephone Encounter (Signed)
can discuss at appt today- lmtcb on pt #-jhb

## 2013-02-05 ENCOUNTER — Telehealth: Payer: Self-pay | Admitting: Nurse Practitioner

## 2013-02-05 NOTE — Telephone Encounter (Signed)
appt made for 02/06/13 She had cancelled her appt she had on 02/02/13.

## 2013-02-05 NOTE — Telephone Encounter (Signed)
Pt aware ntbs- appt made for 6/10-jhb

## 2013-02-06 ENCOUNTER — Ambulatory Visit (INDEPENDENT_AMBULATORY_CARE_PROVIDER_SITE_OTHER): Payer: Medicare Other

## 2013-02-06 ENCOUNTER — Ambulatory Visit (INDEPENDENT_AMBULATORY_CARE_PROVIDER_SITE_OTHER): Payer: Medicare Other | Admitting: General Practice

## 2013-02-06 ENCOUNTER — Encounter: Payer: Self-pay | Admitting: General Practice

## 2013-02-06 VITALS — BP 102/71 | HR 111 | Temp 98.2°F | Ht 61.75 in | Wt 234.0 lb

## 2013-02-06 DIAGNOSIS — J069 Acute upper respiratory infection, unspecified: Secondary | ICD-10-CM

## 2013-02-06 DIAGNOSIS — R05 Cough: Secondary | ICD-10-CM

## 2013-02-06 MED ORDER — BENZONATATE 100 MG PO CAPS: 100.0000 mg | ORAL_CAPSULE | Freq: Two times a day (BID) | ORAL | Status: DC | PRN

## 2013-02-06 MED ORDER — AZITHROMYCIN 250 MG PO TABS: ORAL_TABLET | ORAL | Status: DC

## 2013-02-06 NOTE — Progress Notes (Signed)
  Subjective:    Patient ID: Bethany Villa, female    DOB: 08/26/1956, 57 y.o.   MRN: [id number]  HPI Presents today for follow up coughing and swelling in left leg. Reports taking inhalers and breathing treatments as prescribed. Reports having a productive yellowish green sputum with coughing.     Review of Systems  Respiratory: Positive for shortness of breath.        Patient reports wearing oxygen as needed and nightly. Reports having a pulse ox to check saturations  Cardiovascular: Positive for leg swelling. Negative for chest pain and palpitations.  Gastrointestinal: Negative for abdominal pain and blood in stool.  Genitourinary: Negative for dysuria, hematuria and difficulty urinating.  Neurological: Positive for weakness. Negative for dizziness, syncope and headaches.       Objective:   Physical Exam  Constitutional: She is oriented to person, place, and time. She appears well-developed and well-nourished.  HENT:  Head: Normocephalic and atraumatic.  Cardiovascular: Regular rhythm.   Edema noted to left lower extremity pedal 2+ pulse noted  Pulmonary/Chest: No respiratory distress. She has wheezes. She exhibits no tenderness.  Expiratory wheezing noted and diminished bilateral breath sounds (bases).   Neurological: She is alert and oriented to person, place, and time.  Skin: Skin is warm and dry.  Psychiatric: She has a normal mood and affect.   [facility] reading (PRIMARY) by Coralie Keens, FNP-C, no acute disease noted.                                     Assessment & Plan:  1. Cough - DG Chest 2 View; Future - benzonatate (TESSALON) 100 MG capsule; Take 1 capsule (100 mg total) by mouth 2 (two) times daily as needed for cough.  Dispense: 20 capsule; Refill: 0  2. Acute upper respiratory infections of unspecified site - azithromycin (ZITHROMAX) 250 MG tablet; Take as directed  Dispense: 6 tablet; Refill: 0 -Increase fluid intake Motrin or tylenol OTC OTC  decongestant New toothbrush in 3 days Proper handwashing -Patient verbalized understanding -Coralie Keens, FNP-C

## 2013-02-06 NOTE — Patient Instructions (Addendum)
Cough, Adult  A cough is a reflex that helps clear your throat and airways. It can help heal the body or may be a reaction to an irritated airway. A cough may only last 2 or 3 weeks (acute) or may last more than 8 weeks (chronic).  CAUSES Acute cough:  Viral or bacterial infections. Chronic cough:  Infections.  Allergies.  Asthma.  Post-nasal drip.  Smoking.  Heartburn or acid reflux.  Some medicines.  Chronic lung problems (COPD).  Cancer. SYMPTOMS   Cough.  Fever.  Chest pain.  Increased breathing rate.  High-pitched whistling sound when breathing (wheezing).  Colored mucus that you cough up (sputum). TREATMENT   A bacterial cough may be treated with antibiotic medicine.  A viral cough must run its course and will not respond to antibiotics.  Your caregiver may recommend other treatments if you have a chronic cough. HOME CARE INSTRUCTIONS   Only take over-the-counter or prescription medicines for pain, discomfort, or fever as directed by your caregiver. Use cough suppressants only as directed by your caregiver.  Use a cold steam vaporizer or humidifier in your bedroom or home to help loosen secretions.  Sleep in a semi-upright position if your cough is worse at night.  Rest as needed.  Stop smoking if you smoke. SEEK IMMEDIATE MEDICAL CARE IF:   You have pus in your sputum.  Your cough starts to worsen.  You cannot control your cough with suppressants and are losing sleep.  You begin coughing up blood.  You have difficulty breathing.  You develop pain which is getting worse or is uncontrolled with medicine.  You have a fever. MAKE SURE YOU:   Understand these instructions.  Will watch your condition.  Will get help right away if you are not doing well or get worse. Document Released: 02/12/2011 Document Revised: 11/08/2011 Document Reviewed: 02/12/2011 [facility] Patient Information 2014 [place], [place]. Cough, Adult  A cough is a  reflex that helps clear your throat and airways. It can help heal the body or may be a reaction to an irritated airway. A cough may only last 2 or 3 weeks (acute) or may last more than 8 weeks (chronic).  CAUSES Acute cough:  Viral or bacterial infections. Chronic cough:  Infections.  Allergies.  Asthma.  Post-nasal drip.  Smoking.  Heartburn or acid reflux.  Some medicines.  Chronic lung problems (COPD).  Cancer. SYMPTOMS   Cough.  Fever.  Chest pain.  Increased breathing rate.  High-pitched whistling sound when breathing (wheezing).  Colored mucus that you cough up (sputum). TREATMENT   A bacterial cough may be treated with antibiotic medicine.  A viral cough must run its course and will not respond to antibiotics.  Your caregiver may recommend other treatments if you have a chronic cough. HOME CARE INSTRUCTIONS   Only take over-the-counter or prescription medicines for pain, discomfort, or fever as directed by your caregiver. Use cough suppressants only as directed by your caregiver.  Use a cold steam vaporizer or humidifier in your bedroom or home to help loosen secretions.  Sleep in a semi-upright position if your cough is worse at night.  Rest as needed.  Stop smoking if you smoke. SEEK IMMEDIATE MEDICAL CARE IF:   You have pus in your sputum.  Your cough starts to worsen.  You cannot control your cough with suppressants and are losing sleep.  You begin coughing up blood.  You have difficulty breathing.  You develop pain which is getting worse or is  uncontrolled with medicine.  You have a fever. MAKE SURE YOU:   Understand these instructions.  Will watch your condition.  Will get help right away if you are not doing well or get worse. Document Released: 02/12/2011 Document Revised: 11/08/2011 Document Reviewed: 02/12/2011 [facility] Patient Information 2014 [place], [place].

## 2013-02-09 ENCOUNTER — Ambulatory Visit: Payer: Medicare Other | Admitting: General Practice

## 2013-02-15 ENCOUNTER — Other Ambulatory Visit: Payer: Self-pay | Admitting: Nurse Practitioner

## 2013-02-26 ENCOUNTER — Other Ambulatory Visit: Payer: Self-pay | Admitting: Nurse Practitioner

## 2013-02-27 NOTE — Telephone Encounter (Signed)
Last seen 02/06/13  MAE    If approved call in and have nurse notify patient

## 2013-02-28 NOTE — Telephone Encounter (Signed)
Called in.

## 2013-02-28 NOTE — Telephone Encounter (Signed)
Please call into pharmacy. thx 

## 2013-03-01 DIAGNOSIS — Z79899 Other long term (current) drug therapy: Secondary | ICD-10-CM | POA: Diagnosis not present

## 2013-03-01 DIAGNOSIS — M47817 Spondylosis without myelopathy or radiculopathy, lumbosacral region: Secondary | ICD-10-CM | POA: Diagnosis not present

## 2013-03-01 DIAGNOSIS — M5380 Other specified dorsopathies, site unspecified: Secondary | ICD-10-CM | POA: Diagnosis not present

## 2013-03-01 DIAGNOSIS — M5137 Other intervertebral disc degeneration, lumbosacral region: Secondary | ICD-10-CM | POA: Diagnosis not present

## 2013-03-01 DIAGNOSIS — M12519 Traumatic arthropathy, unspecified shoulder: Secondary | ICD-10-CM | POA: Diagnosis not present

## 2013-03-06 DIAGNOSIS — M12519 Traumatic arthropathy, unspecified shoulder: Secondary | ICD-10-CM | POA: Diagnosis not present

## 2013-03-08 ENCOUNTER — Other Ambulatory Visit: Payer: Self-pay | Admitting: Nurse Practitioner

## 2013-03-11 ENCOUNTER — Other Ambulatory Visit: Payer: Self-pay | Admitting: Nurse Practitioner

## 2013-03-30 ENCOUNTER — Other Ambulatory Visit: Payer: Self-pay | Admitting: *Deleted

## 2013-03-30 MED ORDER — DIAZEPAM 5 MG PO TABS: ORAL_TABLET | ORAL | Status: DC

## 2013-03-30 NOTE — Telephone Encounter (Signed)
Please call in thx 

## 2013-03-30 NOTE — Telephone Encounter (Signed)
Patient last seen in office on 02-06-13 for an acute visit and last chronic follow up was 12-28-12. Rx last filled on 02-28-13 for #60. Please advise. If approved please route to Pool B so it can be called in.

## 2013-04-02 ENCOUNTER — Other Ambulatory Visit: Payer: Self-pay | Admitting: *Deleted

## 2013-04-02 NOTE — Telephone Encounter (Signed)
Not due for refill until 04/09/13

## 2013-04-02 NOTE — Telephone Encounter (Signed)
Patient last seen in office on 02-06-13. Rx last filled on 03-09-13 for #30. Please advise. If approved please route to Pool B so nurse can call to [facility] in [place].

## 2013-04-03 NOTE — Telephone Encounter (Signed)
[facility]

## 2013-04-13 ENCOUNTER — Other Ambulatory Visit: Payer: Self-pay

## 2013-04-13 DIAGNOSIS — F329 Major depressive disorder, single episode, unspecified: Secondary | ICD-10-CM

## 2013-04-13 MED ORDER — OLOPATADINE HCL 0.1 % OP SOLN: 1.0000 [drp] | Freq: Two times a day (BID) | OPHTHALMIC | Status: DC

## 2013-04-13 MED ORDER — FUROSEMIDE 40 MG PO TABS: 40.0000 mg | ORAL_TABLET | Freq: Every day | ORAL | Status: DC

## 2013-04-13 MED ORDER — BUPROPION HCL ER (XL) 150 MG PO TB24: 150.0000 mg | ORAL_TABLET | Freq: Every morning | ORAL | Status: DC

## 2013-04-13 MED ORDER — MONTELUKAST SODIUM 10 MG PO TABS: 10.0000 mg | ORAL_TABLET | Freq: Every day | ORAL | Status: DC

## 2013-04-13 MED ORDER — OMEPRAZOLE 20 MG PO CPDR: 20.0000 mg | DELAYED_RELEASE_CAPSULE | Freq: Every day | ORAL | Status: DC

## 2013-04-13 MED ORDER — ATORVASTATIN CALCIUM 20 MG PO TABS: 20.0000 mg | ORAL_TABLET | Freq: Every day | ORAL | Status: DC

## 2013-04-13 MED ORDER — FLUTICASONE PROPIONATE 50 MCG/ACT NA SUSP: 2.0000 | Freq: Every day | NASAL | Status: DC

## 2013-04-13 MED ORDER — DULOXETINE HCL 60 MG PO CPEP: 60.0000 mg | ORAL_CAPSULE | Freq: Every day | ORAL | Status: DC

## 2013-04-16 ENCOUNTER — Other Ambulatory Visit: Payer: Self-pay | Admitting: Nurse Practitioner

## 2013-04-16 MED ORDER — FUROSEMIDE 40 MG PO TABS: 40.0000 mg | ORAL_TABLET | Freq: Every day | ORAL | Status: DC

## 2013-04-16 NOTE — Telephone Encounter (Signed)
Last seen 11/07/12  MMM

## 2013-04-23 DIAGNOSIS — M47817 Spondylosis without myelopathy or radiculopathy, lumbosacral region: Secondary | ICD-10-CM | POA: Diagnosis not present

## 2013-04-23 DIAGNOSIS — M25519 Pain in unspecified shoulder: Secondary | ICD-10-CM | POA: Diagnosis not present

## 2013-04-23 DIAGNOSIS — M12519 Traumatic arthropathy, unspecified shoulder: Secondary | ICD-10-CM | POA: Diagnosis not present

## 2013-04-23 DIAGNOSIS — M5137 Other intervertebral disc degeneration, lumbosacral region: Secondary | ICD-10-CM | POA: Diagnosis not present

## 2013-04-23 DIAGNOSIS — M5380 Other specified dorsopathies, site unspecified: Secondary | ICD-10-CM | POA: Diagnosis not present

## 2013-04-23 DIAGNOSIS — M51379 Other intervertebral disc degeneration, lumbosacral region without mention of lumbar back pain or lower extremity pain: Secondary | ICD-10-CM | POA: Diagnosis not present

## 2013-04-23 DIAGNOSIS — Z79899 Other long term (current) drug therapy: Secondary | ICD-10-CM | POA: Diagnosis not present

## 2013-04-24 ENCOUNTER — Telehealth: Payer: Self-pay | Admitting: Nurse Practitioner

## 2013-04-24 DIAGNOSIS — R05 Cough: Secondary | ICD-10-CM

## 2013-04-24 MED ORDER — BENZONATATE 100 MG PO CAPS: 100.0000 mg | ORAL_CAPSULE | Freq: Two times a day (BID) | ORAL | Status: DC | PRN

## 2013-04-24 NOTE — Telephone Encounter (Signed)
mucinex OTC -NTBS if wants codeine cough meds

## 2013-04-24 NOTE — Telephone Encounter (Signed)
[facility] [place]

## 2013-04-24 NOTE — Telephone Encounter (Signed)
Tessalon perles rx sent in

## 2013-04-24 NOTE — Telephone Encounter (Signed)
Mmm address

## 2013-04-24 NOTE — Telephone Encounter (Signed)
PT AWARE OF MED

## 2013-05-01 ENCOUNTER — Other Ambulatory Visit: Payer: Self-pay | Admitting: Nurse Practitioner

## 2013-05-02 NOTE — Telephone Encounter (Signed)
Last filled 03/30/13, last seen 02/06/13. If approved route to pool B, [facility]

## 2013-05-04 ENCOUNTER — Other Ambulatory Visit: Payer: Self-pay | Admitting: Nurse Practitioner

## 2013-05-04 ENCOUNTER — Telehealth: Payer: Self-pay | Admitting: Nurse Practitioner

## 2013-05-04 MED ORDER — AZITHROMYCIN 250 MG PO TABS: ORAL_TABLET | ORAL | Status: DC

## 2013-05-04 MED ORDER — DIAZEPAM 5 MG PO TABS: ORAL_TABLET | ORAL | Status: DC

## 2013-05-04 NOTE — Telephone Encounter (Signed)
Pt aware will forward to mmm , and see if we can send into pharm- If no response from mmm today then NTBS on sat clinic tomorrow  Mmm to address

## 2013-05-04 NOTE — Telephone Encounter (Signed)
rx sent to pharmacy

## 2013-05-04 NOTE — Telephone Encounter (Signed)
Pt aware - med sent to pharm   

## 2013-05-05 ENCOUNTER — Emergency Department (HOSPITAL_COMMUNITY): Payer: Medicare Other

## 2013-05-05 ENCOUNTER — Encounter (HOSPITAL_COMMUNITY): Payer: Self-pay

## 2013-05-05 ENCOUNTER — Inpatient Hospital Stay (HOSPITAL_COMMUNITY)
Admission: EM | Admit: 2013-05-05 | Discharge: 2013-05-08 | DRG: 189 | Disposition: A | Discharge status: Home / Self Care | Payer: Medicare Other | Attending: Internal Medicine | Admitting: Internal Medicine

## 2013-05-05 DIAGNOSIS — Z79899 Other long term (current) drug therapy: Secondary | ICD-10-CM | POA: Diagnosis not present

## 2013-05-05 DIAGNOSIS — I5030 Unspecified diastolic (congestive) heart failure: Secondary | ICD-10-CM | POA: Diagnosis present

## 2013-05-05 DIAGNOSIS — F17200 Nicotine dependence, unspecified, uncomplicated: Secondary | ICD-10-CM | POA: Diagnosis not present

## 2013-05-05 DIAGNOSIS — R0602 Shortness of breath: Secondary | ICD-10-CM | POA: Diagnosis not present

## 2013-05-05 DIAGNOSIS — J9602 Acute respiratory failure with hypercapnia: Secondary | ICD-10-CM

## 2013-05-05 DIAGNOSIS — E785 Hyperlipidemia, unspecified: Secondary | ICD-10-CM | POA: Diagnosis present

## 2013-05-05 DIAGNOSIS — F411 Generalized anxiety disorder: Secondary | ICD-10-CM | POA: Diagnosis present

## 2013-05-05 DIAGNOSIS — J189 Pneumonia, unspecified organism: Secondary | ICD-10-CM

## 2013-05-05 DIAGNOSIS — J449 Chronic obstructive pulmonary disease, unspecified: Secondary | ICD-10-CM

## 2013-05-05 DIAGNOSIS — E669 Obesity, unspecified: Secondary | ICD-10-CM | POA: Diagnosis not present

## 2013-05-05 DIAGNOSIS — J9620 Acute and chronic respiratory failure, unspecified whether with hypoxia or hypercapnia: Secondary | ICD-10-CM | POA: Diagnosis not present

## 2013-05-05 DIAGNOSIS — K219 Gastro-esophageal reflux disease without esophagitis: Secondary | ICD-10-CM | POA: Diagnosis present

## 2013-05-05 DIAGNOSIS — F410 Panic disorder [episodic paroxysmal anxiety] without agoraphobia: Secondary | ICD-10-CM | POA: Diagnosis present

## 2013-05-05 DIAGNOSIS — IMO0001 Reserved for inherently not codable concepts without codable children: Secondary | ICD-10-CM

## 2013-05-05 DIAGNOSIS — J155 Pneumonia due to Escherichia coli: Secondary | ICD-10-CM

## 2013-05-05 DIAGNOSIS — F329 Major depressive disorder, single episode, unspecified: Secondary | ICD-10-CM | POA: Diagnosis present

## 2013-05-05 DIAGNOSIS — F32A Depression, unspecified: Secondary | ICD-10-CM

## 2013-05-05 DIAGNOSIS — E8881 Metabolic syndrome: Secondary | ICD-10-CM | POA: Diagnosis present

## 2013-05-05 DIAGNOSIS — R0902 Hypoxemia: Secondary | ICD-10-CM | POA: Diagnosis not present

## 2013-05-05 DIAGNOSIS — R739 Hyperglycemia, unspecified: Secondary | ICD-10-CM

## 2013-05-05 DIAGNOSIS — I509 Heart failure, unspecified: Secondary | ICD-10-CM | POA: Diagnosis not present

## 2013-05-05 DIAGNOSIS — G894 Chronic pain syndrome: Secondary | ICD-10-CM | POA: Diagnosis not present

## 2013-05-05 DIAGNOSIS — J9600 Acute respiratory failure, unspecified whether with hypoxia or hypercapnia: Secondary | ICD-10-CM | POA: Diagnosis not present

## 2013-05-05 DIAGNOSIS — G9341 Metabolic encephalopathy: Secondary | ICD-10-CM

## 2013-05-05 DIAGNOSIS — J441 Chronic obstructive pulmonary disease with (acute) exacerbation: Secondary | ICD-10-CM

## 2013-05-05 DIAGNOSIS — R0989 Other specified symptoms and signs involving the circulatory and respiratory systems: Secondary | ICD-10-CM | POA: Diagnosis not present

## 2013-05-05 DIAGNOSIS — M1990 Unspecified osteoarthritis, unspecified site: Secondary | ICD-10-CM | POA: Diagnosis present

## 2013-05-05 DIAGNOSIS — G8929 Other chronic pain: Secondary | ICD-10-CM | POA: Diagnosis present

## 2013-05-05 DIAGNOSIS — J9622 Acute and chronic respiratory failure with hypercapnia: Secondary | ICD-10-CM | POA: Diagnosis present

## 2013-05-05 DIAGNOSIS — E739 Lactose intolerance, unspecified: Secondary | ICD-10-CM | POA: Diagnosis present

## 2013-05-05 DIAGNOSIS — F3289 Other specified depressive episodes: Secondary | ICD-10-CM | POA: Diagnosis present

## 2013-05-05 DIAGNOSIS — M797 Fibromyalgia: Secondary | ICD-10-CM

## 2013-05-05 DIAGNOSIS — G4700 Insomnia, unspecified: Secondary | ICD-10-CM

## 2013-05-05 LAB — COMPREHENSIVE METABOLIC PANEL
ALT: 10 U/L (ref 0–35)
Alkaline Phosphatase: 112 U/L (ref 39–117)
BUN: 6 mg/dL (ref 6–23)
CO2: 38 mEq/L — ABNORMAL HIGH (ref 19–32)
Chloride: 95 mEq/L — ABNORMAL LOW (ref 96–112)
GFR calc Af Amer: >90 mL/min (ref >90)
GFR calc non Af Amer: >90 mL/min (ref >90)
Glucose, Bld: 166 mg/dL — ABNORMAL HIGH (ref 70–99)
Potassium: 3.9 mEq/L (ref 3.5–5.1)
Sodium: 138 mEq/L (ref 135–145)
Total Bilirubin: 0.2 mg/dL — ABNORMAL LOW (ref 0.3–1.2)

## 2013-05-05 LAB — BLOOD GAS, ARTERIAL
Acid-Base Excess: 14.4 mmol/L — ABNORMAL HIGH (ref 0.0–2.0)
Bicarbonate: 37.2 mEq/L — ABNORMAL HIGH (ref 20.0–24.0)
Bicarbonate: 41.6 mEq/L — ABNORMAL HIGH (ref 20.0–24.0)
Delivery systems: POSITIVE
Drawn by: 2223
Expiratory PAP: 5
FIO2: 35 %
Inspiratory PAP: 10
O2 Saturation: 91.1 %
Patient temperature: 37
Patient temperature: 37
TCO2: 37.7 mmol/L (ref 0–100)
pCO2 arterial: 101 mmHg (ref 35.0–45.0)
pCO2 arterial: 91.6 mmHg (ref 35.0–45.0)
pH, Arterial: 7.192 — CL (ref 7.350–7.450)
pH, Arterial: 7.279 — ABNORMAL LOW (ref 7.350–7.450)
pO2, Arterial: 65.7 mmHg — ABNORMAL LOW (ref 80.0–100.0)

## 2013-05-05 LAB — CBC WITH DIFFERENTIAL/PLATELET
Hemoglobin: 13.6 g/dL (ref 12.0–15.0)
Lymphocytes Relative: 32 % (ref 12–46)
Lymphs Abs: 2.9 10*3/uL (ref 0.7–4.0)
Monocytes Relative: 9 % (ref 3–12)
Neutrophils Relative %: 56 % (ref 43–77)
Platelets: 236 10*3/uL (ref 150–400)
RBC: 4.87 MIL/uL (ref 3.87–5.11)
WBC: 9.1 10*3/uL (ref 4.0–10.5)

## 2013-05-05 LAB — MRSA PCR SCREENING: MRSA by PCR: POSITIVE — AB

## 2013-05-05 LAB — PRO B NATRIURETIC PEPTIDE: Pro B Natriuretic peptide (BNP): 3032 pg/mL — ABNORMAL HIGH (ref 0–125)

## 2013-05-05 MED ORDER — DIAZEPAM 5 MG PO TABS
5.0000 mg | ORAL_TABLET | Freq: Two times a day (BID) | ORAL | Status: DC | PRN
  Administered 2013-05-06 (×2): 5 mg via ORAL
  Filled 2013-05-05 (×2): qty 1

## 2013-05-05 MED ORDER — LEVOFLOXACIN IN D5W 500 MG/100ML IV SOLN
500.0000 mg | INTRAVENOUS | Status: DC
  Administered 2013-05-05 – 2013-05-07 (×3): 500 mg via INTRAVENOUS
  Filled 2013-05-05 (×4): qty 100

## 2013-05-05 MED ORDER — ATORVASTATIN CALCIUM 10 MG PO TABS
20.0000 mg | ORAL_TABLET | Freq: Every day | ORAL | Status: DC
  Administered 2013-05-05 – 2013-05-07 (×3): 20 mg via ORAL
  Filled 2013-05-05 (×5): qty 2

## 2013-05-05 MED ORDER — FUROSEMIDE 10 MG/ML IJ SOLN
20.0000 mg | Freq: Once | INTRAMUSCULAR | Status: AC
  Administered 2013-05-05: 20 mg via INTRAVENOUS
  Filled 2013-05-05: qty 2

## 2013-05-05 MED ORDER — ALBUTEROL SULFATE (5 MG/ML) 0.5% IN NEBU
5.0000 mg | INHALATION_SOLUTION | RESPIRATORY_TRACT | Status: DC
  Administered 2013-05-05 – 2013-05-08 (×15): 5 mg via RESPIRATORY_TRACT
  Administered 2013-05-08: 2.5 mg via RESPIRATORY_TRACT
  Filled 2013-05-05 (×2): qty 1
  Filled 2013-05-05 (×6): qty 0.5
  Filled 2013-05-05 (×4): qty 1
  Filled 2013-05-05 (×3): qty 0.5
  Filled 2013-05-05: qty 1
  Filled 2013-05-05 (×2): qty 0.5

## 2013-05-05 MED ORDER — OXYCODONE HCL 5 MG PO TABS
15.0000 mg | ORAL_TABLET | Freq: Every day | ORAL | Status: DC
  Administered 2013-05-05 – 2013-05-06 (×3): 15 mg via ORAL
  Filled 2013-05-05: qty 2
  Filled 2013-05-05: qty 3
  Filled 2013-05-05: qty 1
  Filled 2013-05-05: qty 3

## 2013-05-05 MED ORDER — MUPIROCIN 2 % EX OINT
1.0000 "application " | TOPICAL_OINTMENT | Freq: Two times a day (BID) | CUTANEOUS | Status: DC
  Administered 2013-05-06 – 2013-05-08 (×6): 1 via NASAL
  Filled 2013-05-05: qty 22

## 2013-05-05 MED ORDER — TIOTROPIUM BROMIDE MONOHYDRATE 18 MCG IN CAPS
18.0000 ug | ORAL_CAPSULE | Freq: Every day | RESPIRATORY_TRACT | Status: DC
  Administered 2013-05-06 – 2013-05-08 (×3): 18 ug via RESPIRATORY_TRACT
  Filled 2013-05-05: qty 5

## 2013-05-05 MED ORDER — CHLORHEXIDINE GLUCONATE CLOTH 2 % EX PADS
6.0000 | MEDICATED_PAD | Freq: Every day | CUTANEOUS | Status: DC
  Administered 2013-05-06 – 2013-05-08 (×3): 6 via TOPICAL

## 2013-05-05 MED ORDER — METHYLPREDNISOLONE SODIUM SUCC 125 MG IJ SOLR
125.0000 mg | Freq: Four times a day (QID) | INTRAMUSCULAR | Status: DC
  Administered 2013-05-05 – 2013-05-07 (×7): 125 mg via INTRAVENOUS
  Filled 2013-05-05 (×7): qty 2

## 2013-05-05 MED ORDER — HEPARIN SODIUM (PORCINE) 5000 UNIT/ML IJ SOLN
5000.0000 [IU] | Freq: Three times a day (TID) | INTRAMUSCULAR | Status: DC
  Administered 2013-05-05: 5000 [IU] via SUBCUTANEOUS

## 2013-05-05 MED ORDER — LEVOFLOXACIN IN D5W 500 MG/100ML IV SOLN
INTRAVENOUS | Status: AC
  Filled 2013-05-05: qty 100

## 2013-05-05 MED ORDER — ACETAMINOPHEN 325 MG PO TABS
325.0000 mg | ORAL_TABLET | Freq: Four times a day (QID) | ORAL | Status: DC | PRN
  Administered 2013-05-06: 325 mg via ORAL
  Filled 2013-05-05: qty 1

## 2013-05-05 MED ORDER — DICLOFENAC SODIUM 75 MG PO TBEC
75.0000 mg | DELAYED_RELEASE_TABLET | Freq: Two times a day (BID) | ORAL | Status: DC
  Administered 2013-05-05 – 2013-05-08 (×6): 75 mg via ORAL
  Filled 2013-05-05 (×10): qty 1

## 2013-05-05 MED ORDER — FUROSEMIDE 40 MG PO TABS
40.0000 mg | ORAL_TABLET | Freq: Every day | ORAL | Status: DC
  Administered 2013-05-05 – 2013-05-06 (×2): 40 mg via ORAL
  Filled 2013-05-05 (×2): qty 1

## 2013-05-05 MED ORDER — BENZONATATE 100 MG PO CAPS
100.0000 mg | ORAL_CAPSULE | Freq: Two times a day (BID) | ORAL | Status: DC | PRN
  Administered 2013-05-06 (×2): 100 mg via ORAL
  Filled 2013-05-05 (×2): qty 1

## 2013-05-05 MED ORDER — OLOPATADINE HCL 0.1 % OP SOLN
1.0000 [drp] | Freq: Two times a day (BID) | OPHTHALMIC | Status: DC
  Administered 2013-05-06 – 2013-05-08 (×5): 1 [drp] via OPHTHALMIC
  Filled 2013-05-05: qty 5

## 2013-05-05 MED ORDER — BUPROPION HCL ER (XL) 150 MG PO TB24
150.0000 mg | ORAL_TABLET | Freq: Every morning | ORAL | Status: DC
  Administered 2013-05-06 – 2013-05-08 (×3): 150 mg via ORAL
  Filled 2013-05-05 (×4): qty 1

## 2013-05-05 MED ORDER — PANTOPRAZOLE SODIUM 40 MG PO TBEC
40.0000 mg | DELAYED_RELEASE_TABLET | Freq: Every day | ORAL | Status: DC
  Administered 2013-05-05 – 2013-05-08 (×4): 40 mg via ORAL
  Filled 2013-05-05 (×4): qty 1

## 2013-05-05 MED ORDER — BUDESONIDE-FORMOTEROL FUMARATE 160-4.5 MCG/ACT IN AERO
2.0000 | INHALATION_SPRAY | Freq: Two times a day (BID) | RESPIRATORY_TRACT | Status: DC
  Administered 2013-05-06 – 2013-05-07 (×3): 2 via RESPIRATORY_TRACT
  Filled 2013-05-05: qty 6

## 2013-05-05 MED ORDER — MONTELUKAST SODIUM 10 MG PO TABS
10.0000 mg | ORAL_TABLET | Freq: Every day | ORAL | Status: DC
  Administered 2013-05-05 – 2013-05-07 (×3): 10 mg via ORAL
  Filled 2013-05-05 (×3): qty 1

## 2013-05-05 MED ORDER — SODIUM CHLORIDE 0.9 % IJ SOLN
3.0000 mL | Freq: Two times a day (BID) | INTRAMUSCULAR | Status: DC
  Administered 2013-05-05 – 2013-05-07 (×4): 3 mL via INTRAVENOUS

## 2013-05-05 MED ORDER — ZOLPIDEM TARTRATE 5 MG PO TABS
5.0000 mg | ORAL_TABLET | Freq: Every evening | ORAL | Status: DC | PRN
  Administered 2013-05-05: 5 mg via ORAL
  Filled 2013-05-05: qty 1

## 2013-05-05 MED ORDER — DICLOFENAC SODIUM 1 % TD GEL
1.0000 "application " | Freq: Four times a day (QID) | TRANSDERMAL | Status: DC | PRN
  Administered 2013-05-06: 1 via TOPICAL
  Filled 2013-05-05: qty 100

## 2013-05-05 MED ORDER — HEPARIN SODIUM (PORCINE) 5000 UNIT/ML IJ SOLN
5000.0000 [IU] | Freq: Three times a day (TID) | INTRAMUSCULAR | Status: DC
  Administered 2013-05-05 – 2013-05-08 (×8): 5000 [IU] via SUBCUTANEOUS
  Filled 2013-05-05 (×9): qty 1

## 2013-05-05 MED ORDER — LEVOFLOXACIN IN D5W 500 MG/100ML IV SOLN
500.0000 mg | INTRAVENOUS | Status: DC
  Filled 2013-05-05: qty 100

## 2013-05-05 MED ORDER — DULOXETINE HCL 60 MG PO CPEP
60.0000 mg | ORAL_CAPSULE | Freq: Every day | ORAL | Status: DC
  Administered 2013-05-05 – 2013-05-08 (×4): 60 mg via ORAL
  Filled 2013-05-05 (×7): qty 1

## 2013-05-05 NOTE — ED Provider Notes (Addendum)
CSN: [id number]     Arrival date & time 05/05/13  1536 History  This chart was scribed for Geoffery Lyons, MD by Bennett Scrape, ED Scribe. This patient was seen in room APA02/APA02 and the patient's care was started at 3:36 PM.   Chief Complaint  Patient presents with  . Respiratory Distress    The history is provided by the patient. No language interpreter was used.   HPI Comments: Bethany Villa is a 57 y.o. female brought in by ambulance with a h/o COPD, who presents to the Emergency Department complaining of respiratory distress. Pt states that she has felt SOB for the past 2 weeks, usually worse at night. She admits that she has been out of her albuterol inhaler and nebulizer for the past few days. She sent a family member to go get the prescriptions filled but became worse and called EMS before they could return. Per EMS, pt was recently admitted on a ventilator to [facility] 6 months ago for the same. She denies being on oxygen at home. EMS reports giving the pt 4 albuterol, nebulizer, 2g magnesium and 125 mg solumedrol en route. EMS reports that the pt's oxygen saturation was 60% on RA upon their arrival and she was started on CPAP for 10 to 15 minutes but was switched to a NRB when she appeared to become lethargic. Upon being on the NRB, EMS states that the pt has become more alert and talkative. Pt also reports associated cough, fever and chronic leg swelling as associated symptoms. She denies having a h/o cardiac disease.  Past Medical History  Diagnosis Date  . Fibromyalgia   . Asthma   . DJD (degenerative joint disease)   . Chronic pain   . Depression   . Anxiety   . Panic attack   . Sleep disorder   . DJD (degenerative joint disease)   . GERD (gastroesophageal reflux disease)   . Hyperlipidemia   . COPD (chronic obstructive pulmonary disease)    Past Surgical History  Procedure Laterality Date  . Abdominal surgery    . Back surgery    . Carpal tunnel release    . Ankle surgery     . Knee surgery    . Cesarean section    . Tonsillectomy    . Tubal ligation     Family History  Problem Relation Age of Onset  . Cancer Mother     beast   History  Substance Use Topics  . Smoking status: Current Every Day Smoker -- 1.00 packs/day for 8 years    Types: Cigarettes  . Smokeless tobacco: Never Used  . Alcohol Use: No     Comment: occ   OB History   Grav Para Term Preterm Abortions TAB SAB Ect Mult Living   3 2 2  1  1   2      Review of Systems  Constitutional: Negative for fever.  Respiratory: Positive for cough and shortness of breath.   Cardiovascular: Positive for leg swelling. Negative for chest pain.  All other systems reviewed and are negative.    Allergies  Penicillins; Sulfa antibiotics; and Mobic  Home Medications   Current Outpatient Rx  Name  Route  Sig  Dispense  Refill  . acetaminophen (TYLENOL) 325 MG tablet   Oral   Take 325 mg by mouth 4 (four) times daily as needed for pain (Taken with Oxycodone 10 mg as needed for pain).         Marland Kitchen albuterol (  PROVENTIL) (2.5 MG/3ML) 0.083% nebulizer solution      Use 1 vial in neubulizer every 6 hours as needed   1 vial   3   . Alum & Mag Hydroxide-Simeth (MAGIC MOUTHWASH) SOLN   Oral   Take 15 mLs by mouth 2 (two) times daily.   200 mL   0   . atorvastatin (LIPITOR) 20 MG tablet   Oral   Take 1 tablet (20 mg total) by mouth daily.   90 tablet   0   . azithromycin (ZITHROMAX Z-PAK) 250 MG tablet      As directed   6 each   0   . azithromycin (ZITHROMAX) 250 MG tablet      Take as directed   6 tablet   0   . benzonatate (TESSALON) 100 MG capsule   Oral   Take 1 capsule (100 mg total) by mouth 2 (two) times daily as needed for cough.   20 capsule   0   . budesonide-formoterol (SYMBICORT) 160-4.5 MCG/ACT inhaler   Inhalation   Inhale 2 puffs into the lungs 2 (two) times daily.   1 Inhaler   0   . buPROPion (WELLBUTRIN XL) 150 MG 24 hr tablet   Oral   Take 1 tablet  (150 mg total) by mouth every morning.   90 tablet   0   . cyclobenzaprine (FLEXERIL) 10 MG tablet      TAKE 1 TABLET BY MOUTH TWICE A DAY   60 tablet   1   . diazepam (VALIUM) 5 MG tablet      TAKE 1 TABLET EVERY 12 HOURS SA NEEDED FOR ANXIETY   60 tablet   0   . diclofenac (VOLTAREN) 75 MG EC tablet   Oral   Take 75 mg by mouth 2 (two) times daily.         . diclofenac sodium (VOLTAREN) 1 % GEL   Topical   Apply 1 application topically 4 (four) times daily as needed. To knee for pain         . DULoxetine (CYMBALTA) 60 MG capsule   Oral   Take 1 capsule (60 mg total) by mouth daily.   90 capsule   0   . fluconazole (DIFLUCAN) 100 MG tablet   Oral   Take 1 tablet (100 mg total) by mouth daily.   7 tablet   0   . fluticasone (FLONASE) 50 MCG/ACT nasal spray   Nasal   Place 2 sprays into the nose daily.   48 g   1   . furosemide (LASIX) 40 MG tablet   Oral   Take 1 tablet (40 mg total) by mouth daily.   90 tablet   0   . ibuprofen (ADVIL,MOTRIN) 800 MG tablet      TAKE 1 TABLET (800 MG TOTAL) BY MOUTH EVERY 6 (SIX) HOURS AS NEEDED FOR PAIN.   90 tablet   0     NTBS   . levalbuterol (XOPENEX HFA) 45 MCG/ACT inhaler   Inhalation   Inhale 1-2 puffs into the lungs every 4 (four) hours as needed. For shortness of breath   1 Inhaler   4   . montelukast (SINGULAIR) 10 MG tablet   Oral   Take 1 tablet (10 mg total) by mouth at bedtime.   90 tablet   1   . nystatin (MYCOSTATIN) 100000 UNIT/ML suspension   Oral   Take 5 mLs (500,000 Units total) by mouth 4 (  four) times daily.   60 mL   0   . olopatadine (PATANOL) 0.1 % ophthalmic solution   Both Eyes   Place 1 drop into both eyes 2 (two) times daily.   15 mL   0   . omeprazole (PRILOSEC) 20 MG capsule   Oral   Take 1 capsule (20 mg total) by mouth daily.   90 capsule   1   . Oxycodone HCl 10 MG TABS   Oral   Take 10 mg by mouth 4 (four) times daily as needed (for pain).         .  pantoprazole (PROTONIX) 40 MG tablet   Oral   Take 1 tablet (40 mg total) by mouth daily.   30 tablet   1   . predniSONE (DELTASONE) 10 MG tablet      Take 40mg  po daily for 3 days, then 30mg  po daily for 3 days then 20mg  po daily for 3 days then 10mg  po daily for 3 days then stop.   30 tablet   0   . PROAIR HFA 108 (90 BASE) MCG/ACT inhaler      INHALE 2 PUFFS INTO THE LUNGS EVERY 6 (SIX) HOURS AS NEEDED FOR WHEEZING OR SHORTNESS OF BREATH.   8.5 each   2   . tiotropium (SPIRIVA) 18 MCG inhalation capsule   Inhalation   Place 18 mcg into inhaler and inhale daily.         Marland Kitchen zolpidem (AMBIEN) 10 MG tablet      TAKE 1 TABLET AT BEDTIME FOR SLEEP   30 tablet   0    Triage Vitals: BP 133/74  Pulse 104  Temp(Src) 99.1 F (37.3 C) (Rectal)  Resp 23  Ht 5\' 4"  (1.626 m)  Wt 234 lb (106.142 kg)  BMI 40.15 kg/m2  SpO2 97%  Physical Exam  Nursing note and vitals reviewed. Constitutional: She is oriented to person, place, and time. She appears well-developed and well-nourished. No distress.  HENT:  Head: Normocephalic and atraumatic.  Eyes: Conjunctivae and EOM are normal.  Neck: Normal range of motion. Neck supple. No tracheal deviation present.  Cardiovascular: Regular rhythm and normal heart sounds.  Tachycardia present.   No murmur heard. Pulmonary/Chest: She is in respiratory distress.  The pt is in mild to moderate respiratory distress. There is poor air movement and there are slight bilateral rhonchi present.   Abdominal: Soft. Bowel sounds are normal. There is no tenderness.  Musculoskeletal: Normal range of motion. She exhibits edema.  There is 1+ bilateral lower extremity pitting edema present  Neurological: She is alert and oriented to person, place, and time. No cranial nerve deficit.  Skin: Skin is warm and dry.  Psychiatric: She has a normal mood and affect. Her behavior is normal.    ED Course  Procedures (including critical care time)  Medications   furosemide (LASIX) injection 20 mg (20 mg Intravenous Given 05/05/13 1600)    DIAGNOSTIC STUDIES: Oxygen Saturation is 97% on NRB, normal by my interpretation.    COORDINATION OF CARE: 3:47 PM-Discussed treatment plan which includes CXR, CBC panel, CMP and troponin with pt at bedside and pt agreed to plan.   Labs Review Labs Reviewed - No data to display Imaging Review No results found.   Date: 05/05/2013  Rate: 106  Rhythm: sinus tachycardia  QRS Axis: normal  Intervals: normal  ST/T Wave abnormalities: normal  Conduction Disutrbances:none  Narrative Interpretation:   Old EKG Reviewed: unchanged  MDM  No diagnosis found. Patient with past medical history of COPD who continues to smoke. She presents to the emergency department today with complaints of shortness of breath has been worsening over the past several weeks. It became much worse today, prompting her to call 911. EMS found the patient with oxygen saturations of 60% and she was given 4 breathing treatments, Solu-Medrol, and magnesium in route. She continues to feel short of breath. Workup was initiated including EKG laboratory studies and chest x-ray. This revealed a sinus tachycardia without acute changes, and elevated BNP, and chest x-ray suggestive of pulmonary vascular congestion. She was given 20 mg of IV Lasix in the emergency department but continues to have a significant oxygen requirement. With her history of prior intubation, I feel as though this patient needs to be watched carefully and will require admission. I spoke with Dr. Karilyn Cota from [facility] who agrees to admit the patient he will see her in the emergency department.  CRITICAL CARE Performed by: Geoffery Lyons Total critical care time: 30 minutes Critical care time was exclusive of separately billable procedures and treating other patients. Critical care was necessary to treat or prevent imminent or life-threatening deterioration. Critical care was time  spent personally by me on the following activities: development of treatment plan with patient and/or surrogate as well as nursing, discussions with consultants, evaluation of patient's response to treatment, examination of patient, obtaining history from patient or surrogate, ordering and performing treatments and interventions, ordering and review of laboratory studies, ordering and review of radiographic studies, pulse oximetry and re-evaluation of patient's condition.   I personally performed the services described in this documentation, which was scribed in my presence. The recorded information has been reviewed and is accurate.      Geoffery Lyons, MD 05/05/13 1715  Geoffery Lyons, MD 05/05/13 [phone]

## 2013-05-05 NOTE — ED Notes (Signed)
Patient removed from non-rebreather by EDP and placed on 6 liters via [place]. Patient repositioned to sitting, O2 sat 88%. Dr Judd Lien aware, non-rebreather placed back on patient.

## 2013-05-05 NOTE — H&P (Signed)
[facility] History and Physical  [name] NWG:[id number] DOB: [birth year] DOA: 05/05/2013  Referring physician: Dr. Judd Lien, ER. PCP: Rudi Heap, MD    Chief Complaint: Dyspnea. Cough.  HPI: Bethany Villa is a 57 y.o. female presents with a 2 to three-week history of dyspnea associated with a green productive cough. She also has been subjectively feeling feverish. When she was hospitalized approximately 6 months ago for COPD exacerbation, she required intubation and mechanical ventilation. She has been treated aggressively in the emergency room but still continues to have dyspnea and desaturation. She is on 100% nonrebreather mask at this point. She is able to talk in sentences and give me a history. She denies any chest pain, palpitations. Unfortunately, she still continues to smoke one pack of cigarettes per day.  Review of Systems:  Apart from history of present illness, other systems negative.  Past Medical History  Diagnosis Date  . Fibromyalgia   . Asthma   . DJD (degenerative joint disease)   . Chronic pain   . Depression   . Anxiety   . Panic attack   . Sleep disorder   . DJD (degenerative joint disease)   . GERD (gastroesophageal reflux disease)   . Hyperlipidemia   . COPD (chronic obstructive pulmonary disease)    Past Surgical History  Procedure Laterality Date  . Abdominal surgery    . Back surgery    . Carpal tunnel release    . Ankle surgery    . Knee surgery    . Cesarean section    . Tonsillectomy    . Tubal ligation     Social History:  reports that she has been smoking Cigarettes.  She has a 8 pack-year smoking history. She has never used smokeless tobacco. She reports that she does not drink alcohol or use illicit drugs.   Allergies  Allergen Reactions  . Penicillins Shortness Of Breath and Swelling  . Sulfa Antibiotics Shortness Of Breath and Swelling  . Mobic [Meloxicam]     Family History  Problem Relation Age of Onset  . Cancer  Mother     beast      Prior to Admission medications   Medication Sig Start Date End Date Taking? Authorizing Provider  albuterol (PROAIR HFA) 108 (90 BASE) MCG/ACT inhaler Inhale 2 puffs into the lungs every 6 (six) hours as needed for wheezing or shortness of breath.   Yes Historical Provider, MD  azithromycin (ZITHROMAX Z-PAK) 250 MG tablet As directed 05/04/13  Yes Mary-Margaret Daphine Deutscher, FNP  oxyCODONE (ROXICODONE) 15 MG immediate release tablet Take 15 mg by mouth 5 (five) times daily.   Yes Historical Provider, MD  PROAIR HFA 108 (90 BASE) MCG/ACT inhaler INHALE 2 PUFFS INTO THE LUNGS EVERY 6 (SIX) HOURS AS NEEDED FOR WHEEZING OR SHORTNESS OF BREATH. 03/11/13  Yes Mae Shelda Altes, FNP  tiotropium (SPIRIVA) 18 MCG inhalation capsule Place 18 mcg into inhaler and inhale daily.   Yes Historical Provider, MD  acetaminophen (TYLENOL) 325 MG tablet Take 325 mg by mouth 4 (four) times daily as needed for pain (Taken with Oxycodone 10 mg as needed for pain).    Historical Provider, MD  albuterol (PROVENTIL) (2.5 MG/3ML) 0.083% nebulizer solution Use 1 vial in neubulizer every 6 hours as needed 12/22/12   Mary-Margaret Daphine Deutscher, FNP  atorvastatin (LIPITOR) 20 MG tablet Take 1 tablet (20 mg total) by mouth daily. 04/13/13   Ernestina Penna, MD  benzonatate (TESSALON) 100 MG capsule Take 1 capsule (100  mg total) by mouth 2 (two) times daily as needed for cough. 04/24/13   Mary-Margaret Daphine Deutscher, FNP  budesonide-formoterol (SYMBICORT) 160-4.5 MCG/ACT inhaler Inhale 2 puffs into the lungs 2 (two) times daily. 12/06/12 01/26/14  Erick Blinks, MD  buPROPion (WELLBUTRIN XL) 150 MG 24 hr tablet Take 1 tablet (150 mg total) by mouth every morning. 04/13/13   Ernestina Penna, MD  diazepam (VALIUM) 5 MG tablet TAKE 1 TABLET EVERY 12 HOURS SA NEEDED FOR ANXIETY 05/02/13   Coralie Keens, FNP  diclofenac (VOLTAREN) 75 MG EC tablet Take 75 mg by mouth 2 (two) times daily. 10/30/12   Historical Provider, MD  diclofenac sodium  (VOLTAREN) 1 % GEL Apply 1 application topically 4 (four) times daily as needed. To knee for pain    Historical Provider, MD  DULoxetine (CYMBALTA) 60 MG capsule Take 1 capsule (60 mg total) by mouth daily. 04/13/13   Ernestina Penna, MD  fluconazole (DIFLUCAN) 100 MG tablet Take 1 tablet (100 mg total) by mouth daily. 12/06/12   Erick Blinks, MD  fluticasone (FLONASE) 50 MCG/ACT nasal spray Place 2 sprays into the nose daily. 04/13/13   Ernestina Penna, MD  furosemide (LASIX) 40 MG tablet Take 1 tablet (40 mg total) by mouth daily. 04/16/13   Mary-Margaret Daphine Deutscher, FNP  ibuprofen (ADVIL,MOTRIN) 800 MG tablet TAKE 1 TABLET (800 MG TOTAL) BY MOUTH EVERY 6 (SIX) HOURS AS NEEDED FOR PAIN. 04/16/13   Mary-Margaret Daphine Deutscher, FNP  levalbuterol [facility] HFA) 45 MCG/ACT inhaler Inhale 1-2 puffs into the lungs every 4 (four) hours as needed. For shortness of breath 12/22/12   Mary-Margaret Daphine Deutscher, FNP  montelukast (SINGULAIR) 10 MG tablet Take 1 tablet (10 mg total) by mouth at bedtime. 04/13/13   Ernestina Penna, MD  olopatadine (PATANOL) 0.1 % ophthalmic solution Place 1 drop into both eyes 2 (two) times daily. 04/13/13   Ernestina Penna, MD  omeprazole (PRILOSEC) 20 MG capsule Take 1 capsule (20 mg total) by mouth daily. 04/13/13   Ernestina Penna, MD  zolpidem (AMBIEN) 10 MG tablet TAKE 1 TABLET AT BEDTIME FOR SLEEP 02/26/13   Coralie Keens, FNP   Physical Exam: Ceasar Mons Vitals:   05/05/13 1637  BP: 124/77  Pulse: 102  Temp:   Resp: 22     General:  She is in respiratory distress. There is no peripheral central cyanosis. She does not look toxic or septic.  Eyes: No pallor. No jaundice.  ENT: No abnormalities.  Neck: No lymphadenopathy.  Cardiovascular: Heart sounds are present in sinus rhythm. There are no murmurs. Jugular venous pressure not raised. There is no clinical evidence of heart failure.  Respiratory: Bilateral wheezing, her chest is tight. There are no crackles or bronchial  breathing.  Abdomen: Soft, nontender, no masses felt. No hepatosplenomegaly. Obese.  Skin: No rash.  Musculoskeletal: No acute joint abnormalities.  Psychiatric: Appropriate affect.  Neurologic: Alert and orientated without any focal neurological signs.  Labs on Admission:  Basic Metabolic Panel:  Recent Labs Lab 05/05/13 1553  NA 138  K 3.9  CL 95*  CO2 38*  GLUCOSE 166*  BUN 6  CREATININE 0.66  CALCIUM 9.2   Liver Function Tests:  Recent Labs Lab 05/05/13 1553  AST 13  ALT 10  ALKPHOS 112  BILITOT 0.2*  PROT 6.9  ALBUMIN 3.0*     CBC:  Recent Labs Lab 05/05/13 1553  WBC 9.1  NEUTROABS 5.1  HGB 13.6  HCT 45.8  MCV 94.0  PLT 236   Cardiac Enzymes:  Recent Labs Lab 05/05/13 1553  TROPONINI <0.30    BNP (last 3 results)  Recent Labs  11/27/12 1534 05/05/13 1553  PROBNP 228.4* 3032.0*   CBG:  Recent Labs Lab 05/05/13 1600  GLUCAP 139*    Radiological Exams on Admission: Dg Chest Port 1 View  05/05/2013   *RADIOLOGY REPORT*  Clinical Data: Shortness of breath and respiratory distress  PORTABLE CHEST - 1 VIEW  Comparison: 02/06/2013  Findings: Mildly degraded exam due to AP portable technique and patient body habitus.  Midline trachea.  Cardiomegaly accentuated by AP portable technique.  No definite pleural fluid. No pneumothorax.  Mild interstitial thickening is accentuated by low lung volumes.  No definite airspace consolidation.  Soft tissues project over the lung bases bilaterally.  IMPRESSION: Cardiomegaly and pulmonary venous congestion.  Low lung volumes.   Original Report Authenticated By: Jeronimo Greaves, M.D.      Assessment/Plan   1. COPD with exacerbation. 2. Acute on chronic respiratory failure secondary to #1. 3. Continued tobacco abuse. 4. Obesity.  Plan: 1. Admit to step down unit. 2. Intravenous steroids. Intravenous antibiotics. 3. Bronchodilators 4. Monitor clinically. She may need intubation and mechanical  ventilation if her condition deteriorates. 5. I counseled her again about tobacco cessation.  Further recommendations will depend on patient's hospital progress.   Code Status: Full code.  Family Communication: Discussed plan with patient at the bedside.   Disposition Plan: Home when medically stable.   Time spent: 60 minutes.  Wilson Singer [facility] Pager [phone].  If 7PM-7AM, please contact night-coverage [email or web address] Password [facility] 05/05/2013, 5:32 PM

## 2013-05-05 NOTE — Progress Notes (Signed)
abg shows pco2 of 91.6 Value improved from 101 after only 30 mins of BiPAP use  Will alert MD and continue with BiPAP for now

## 2013-05-05 NOTE — ED Notes (Addendum)
Pt arrived via [employer] rescue squad with [facility] paramedic on board with pt.   EMS reports was called out for SOB.  Has history of copd.  Was on ventilator and had pneumonia 6mos ago.  C/O sob today and was out of albuterol neb.  Has been using albuterol inhaler until it ran out also.  EMS says 02 sat initially was 60%.  Rescue squad put pt on NRB.  PT's sat increased to 95% and pt was speaking complete sentences.  EMS administered total of 4 albuterol nebs, 125mg  solumedrol, and magnesium 2g over infusing.  EMS put cpap on pt enroute because sats started to decrease on nrb.  Pt says breath sounds also diminished in the unit.  PT became very drowsy on cpap so ems bagged pt with bvm.  EMS placed saline lock in left ac.

## 2013-05-05 NOTE — ED Notes (Signed)
CRITICAL VALUE ALERT  Critical value received:  Blood Gas PH 7.19, PCO2 101, PO2 133, O2 sat on non-rebreather 97.8  Date of notification: 05/05/2013  Time of notification:  1738  Critical value read back:yes  Nurse who received alert:  Tarri Glenn RN   MD notified (1st page):  Dr Judd Lien  Time of first page:  1740  MD notified (2nd page):  Time of second page:  Responding MD:  Dr Judd Lien  Time MD responded:  [phone]

## 2013-05-05 NOTE — Progress Notes (Signed)
eLink Physician-Brief Progress Note Patient Name: Bethany Villa DOB: 15-Jun-1956 MRN: [id number]  Date of Service  05/05/2013   HPI/Events of Note   Hypercarbic respiratory failure   eICU Interventions  ABG stat, BiPAP to be inititated      [facility] 05/05/2013, 7:05 PM

## 2013-05-06 DIAGNOSIS — J9600 Acute respiratory failure, unspecified whether with hypoxia or hypercapnia: Secondary | ICD-10-CM

## 2013-05-06 DIAGNOSIS — J441 Chronic obstructive pulmonary disease with (acute) exacerbation: Secondary | ICD-10-CM | POA: Diagnosis not present

## 2013-05-06 DIAGNOSIS — E669 Obesity, unspecified: Secondary | ICD-10-CM | POA: Diagnosis not present

## 2013-05-06 DIAGNOSIS — F17200 Nicotine dependence, unspecified, uncomplicated: Secondary | ICD-10-CM | POA: Diagnosis not present

## 2013-05-06 LAB — BLOOD GAS, ARTERIAL
Acid-Base Excess: 12.5 mmol/L — ABNORMAL HIGH (ref 0.0–2.0)
Acid-Base Excess: 13.6 mmol/L — ABNORMAL HIGH (ref 0.0–2.0)
Bicarbonate: 40.6 mEq/L — ABNORMAL HIGH (ref 20.0–24.0)
Expiratory PAP: 5
Inspiratory PAP: 14
O2 Saturation: 92.2 %
Patient temperature: 37
RATE: 10 resp/min
TCO2: 37.7 mmol/L (ref 0–100)
pCO2 arterial: 90.1 mmHg (ref 35.0–45.0)
pH, Arterial: 7.278 — ABNORMAL LOW (ref 7.350–7.450)

## 2013-05-06 LAB — COMPREHENSIVE METABOLIC PANEL
AST: 17 U/L (ref 0–37)
CO2: 39 mEq/L — ABNORMAL HIGH (ref 19–32)
Calcium: 9.1 mg/dL (ref 8.4–10.5)
Creatinine, Ser: 0.65 mg/dL (ref 0.50–1.10)
GFR calc non Af Amer: >90 mL/min (ref >90)
Sodium: 140 mEq/L (ref 135–145)
Total Protein: 7.3 g/dL (ref 6.0–8.3)

## 2013-05-06 LAB — CBC
HCT: 47.6 % — ABNORMAL HIGH (ref 36.0–46.0)
Hemoglobin: 13.8 g/dL (ref 12.0–15.0)
MCH: 27.3 pg (ref 26.0–34.0)
RBC: 5.05 MIL/uL (ref 3.87–5.11)

## 2013-05-06 MED ORDER — BUDESONIDE-FORMOTEROL FUMARATE 160-4.5 MCG/ACT IN AERO
INHALATION_SPRAY | RESPIRATORY_TRACT | Status: AC
  Filled 2013-05-06: qty 6

## 2013-05-06 MED ORDER — LORAZEPAM 2 MG/ML IJ SOLN
1.0000 mg | Freq: Four times a day (QID) | INTRAMUSCULAR | Status: DC | PRN
  Administered 2013-05-06 – 2013-05-07 (×3): 1 mg via INTRAVENOUS
  Filled 2013-05-06 (×2): qty 1

## 2013-05-06 MED ORDER — FUROSEMIDE 10 MG/ML IJ SOLN
40.0000 mg | Freq: Every day | INTRAMUSCULAR | Status: DC
  Administered 2013-05-06 – 2013-05-08 (×3): 40 mg via INTRAVENOUS
  Filled 2013-05-06 (×3): qty 4

## 2013-05-06 MED ORDER — OXYCODONE HCL ER 15 MG PO T12A
15.0000 mg | EXTENDED_RELEASE_TABLET | Freq: Two times a day (BID) | ORAL | Status: DC
  Administered 2013-05-06 – 2013-05-08 (×4): 15 mg via ORAL
  Filled 2013-05-06 (×4): qty 1

## 2013-05-06 MED ORDER — LORAZEPAM 2 MG/ML IJ SOLN
INTRAMUSCULAR | Status: AC
  Filled 2013-05-06: qty 1

## 2013-05-06 MED ORDER — OXYCODONE HCL 5 MG PO TABS
15.0000 mg | ORAL_TABLET | Freq: Four times a day (QID) | ORAL | Status: DC | PRN
  Administered 2013-05-06 – 2013-05-08 (×4): 15 mg via ORAL
  Filled 2013-05-06 (×5): qty 3

## 2013-05-06 NOTE — Progress Notes (Signed)
DR Karilyn Cota CALLED CONCERNING CRITICAL ABG LAB VALES

## 2013-05-06 NOTE — Progress Notes (Signed)
RN Boone Master place patient on 3 L [place] to eat dinner. RT was notified.

## 2013-05-06 NOTE — Progress Notes (Signed)
PT IS ALERT AND ORIENTED AND VERY HUNGRY. BIPAP ON STANDBY. PT STARTED ON 4LNC WHLIE EATING. PT MISSED LUNCH.Marland Kitchen

## 2013-05-06 NOTE — Progress Notes (Signed)
Patient removed BIPAP after having a nightmare;was placed back on her nasal cannula;pt stable and watching t.v.

## 2013-05-06 NOTE — Progress Notes (Signed)
Bethany Villa ZOX:[id number] DOB: 1956-05-03 DOA: 05/05/2013 PCP: Rudi Heap, MD   Subjective: This lady is improved from yesterday. She was put on BiPAP by critical care medicine but she did not tolerate this. She is now on 4 L oxygen by nasal cannula, saturating adequately. She tells me that at home, she is on 2.5 L of oxygen per minute. She has chronic back pain.           Physical Exam: Blood pressure 110/73, pulse 82, temperature 98.5 F (36.9 C), temperature source Oral, resp. rate 20, height 5\' 4"  (1.626 m), weight 102.7 kg (226 lb 6.6 oz), SpO2 92.00%. She looks better. Lung fields show bilateral wheezing but her chest is not as tight as yesterday. There are a few scattered crackles. She is alert and orientated.   Investigations:  Recent Results (from the past 240 hour(s))  MRSA PCR SCREENING     Status: Abnormal   Collection Time    05/05/13  7:00 PM      Result Value Range Status   MRSA by PCR POSITIVE (*) NEGATIVE Final   Comment:            The GeneXpert MRSA Assay (FDA     approved for NASAL specimens     only), is one component of a     comprehensive MRSA colonization     surveillance program. It is not     intended to diagnose MRSA     infection nor to guide or     monitor treatment for     MRSA infections.     CRITICAL RESULT CALLED TO, READ BACK BY AND VERIFIED WITH:     M. GRAY AT 2346 ON 05/05/13 BY Wynonia Lawman     Basic Metabolic Panel:  Recent Labs  81/19/14 1553 05/06/13 0519  NA 138 140  K 3.9 4.1  CL 95* 92*  CO2 38* 39*  GLUCOSE 166* 230*  BUN 6 9  CREATININE 0.66 0.65  CALCIUM 9.2 9.1   Liver Function Tests:  Recent Labs  05/05/13 1553 05/06/13 0519  AST 13 17  ALT 10 14  ALKPHOS 112 118*  BILITOT 0.2* 0.2*  PROT 6.9 7.3  ALBUMIN 3.0* 3.0*     CBC:  Recent Labs  05/05/13 1553 05/06/13 0519  WBC 9.1 6.8  NEUTROABS 5.1  --   HGB 13.6 13.8  HCT 45.8 47.6*  MCV 94.0 94.3  PLT 236 256    Dg Chest Port 1  View  05/05/2013   *RADIOLOGY REPORT*  Clinical Data: Shortness of breath and respiratory distress  PORTABLE CHEST - 1 VIEW  Comparison: 02/06/2013  Findings: Mildly degraded exam due to AP portable technique and patient body habitus.  Midline trachea.  Cardiomegaly accentuated by AP portable technique.  No definite pleural fluid. No pneumothorax.  Mild interstitial thickening is accentuated by low lung volumes.  No definite airspace consolidation.  Soft tissues project over the lung bases bilaterally.  IMPRESSION: Cardiomegaly and pulmonary venous congestion.  Low lung volumes.   Original Report Authenticated By: Jeronimo Greaves, M.D.      Medications: I have reviewed the patient's current medications.  Impression: 1. COPD exacerbation, clinically improving. 2. Acute on chronic respiratory failure 2 secondary to #1. 3. Chronic back pain on opioids. 4. Obesity. 5. Tobacco abuse.     Plan: 1. Continue with current therapy with intravenous steroids and antibiotics and bronchodilators. 2. I reduced her oxygen to 3.5 L per minute and we will  do a blood gas on this. 3. I have changed her opioids by using OxyContin twice a day and oxycodone for breakthrough.  Consultants:   none.   Procedures:  None.   Antibiotics:  Intravenous Levaquin started 05/05/2013.                   Code Status: Full code.  Family Communication: Discussed plan with patient at the bedside.   Disposition Plan: Home when medically stable.  Time spent: 20 minutes.   LOS: 1 day   Wilson Singer Pager [phone]  05/06/2013, 7:55 AM

## 2013-05-07 ENCOUNTER — Inpatient Hospital Stay (HOSPITAL_COMMUNITY): Payer: Medicare Other

## 2013-05-07 DIAGNOSIS — R0989 Other specified symptoms and signs involving the circulatory and respiratory systems: Secondary | ICD-10-CM | POA: Diagnosis not present

## 2013-05-07 DIAGNOSIS — I509 Heart failure, unspecified: Secondary | ICD-10-CM | POA: Diagnosis not present

## 2013-05-07 DIAGNOSIS — J441 Chronic obstructive pulmonary disease with (acute) exacerbation: Secondary | ICD-10-CM | POA: Diagnosis not present

## 2013-05-07 DIAGNOSIS — F411 Generalized anxiety disorder: Secondary | ICD-10-CM

## 2013-05-07 DIAGNOSIS — J9600 Acute respiratory failure, unspecified whether with hypoxia or hypercapnia: Secondary | ICD-10-CM | POA: Diagnosis not present

## 2013-05-07 LAB — BLOOD GAS, ARTERIAL
Acid-Base Excess: 15.6 mmol/L — ABNORMAL HIGH (ref 0.0–2.0)
Drawn by: 22223
O2 Content: 3 L/min
O2 Saturation: 88.7 %
pO2, Arterial: 58.5 mmHg — ABNORMAL LOW (ref 80.0–100.0)

## 2013-05-07 MED ORDER — NICOTINE 21 MG/24HR TD PT24
21.0000 mg | MEDICATED_PATCH | Freq: Every day | TRANSDERMAL | Status: DC
  Administered 2013-05-07 – 2013-05-08 (×2): 21 mg via TRANSDERMAL
  Filled 2013-05-07 (×2): qty 1

## 2013-05-07 MED ORDER — METHYLPREDNISOLONE SODIUM SUCC 125 MG IJ SOLR
125.0000 mg | Freq: Two times a day (BID) | INTRAMUSCULAR | Status: DC
  Administered 2013-05-07 – 2013-05-08 (×2): 125 mg via INTRAVENOUS
  Filled 2013-05-07 (×3): qty 2

## 2013-05-07 NOTE — Progress Notes (Signed)
Inpatient Diabetes Program Recommendations  AACE/ADA: New Consensus Statement on Inpatient Glycemic Control (2013)  Target Ranges:  Prepandial:   less than 140 mg/dL      Peak postprandial:   less than 180 mg/dL (1-2 hours)      Critically ill patients:  140 - 180 mg/dL   Results for Bethany Villa, Bethany Villa (MRN [id number]) as of 05/07/2013 10:41  Ref. Range 05/05/2013 15:53 05/06/2013 05:19  Glucose Latest Range: 70-99 mg/dL 782 (H) 956 (H)    Inpatient Diabetes Program Recommendations Correction (SSI): Please order CBGs ACHS with Novolog moderate correction scale if needed. HgbA1C: Please consider ordering an A1C to determine glycemic control over the past 2-3 months. Diet: May want to consider changing diet to Carb Modified Diabetic diet if appropriate.  Note: Patient does not have a documented history of diabetes.  However, there is a documented history of hyperglycemia.  Last A1C was 6.0% on 05/19/2011.  Patient is ordered Solumedrol 125 mg Q12H (changed today, was Solumedrol 125 mg Q6H).  Fasting lab glucose was 230 mg/dl on 09/30/28.  Noted hyperglycemia likely due to steroids.  While inpatient, please order CBGs ACHS with Novolog moderate correction scale if needed and order an A1C to determine glycemic control over the past 2-3 months.  If appropriate, may want to change diet to Carb Modified Diabetic diet.  Will continue to follow.  Thanks, Orlando Penner, RN, MSN, CCRN Diabetes Coordinator Inpatient Diabetes Program [phone]

## 2013-05-07 NOTE — Progress Notes (Signed)
Nutrition Brief Note  Patient identified on the Malnutrition Screening Tool (MST) Report, with a score of 2.   Wt Readings from Last 15 Encounters:  05/07/13 227 lb 4.7 oz (103.1 kg)  02/06/13 234 lb (106.142 kg)  12/28/12 230 lb (104.327 kg)  12/02/12 234 lb 9.1 oz (106.4 kg)  07/15/12 200 lb (90.719 kg)  03/27/12 200 lb (90.719 kg)  01/17/12 220 lb (99.791 kg)  11/15/11 220 lb (99.791 kg)  10/08/11 229 lb 15 oz (104.3 kg)  08/10/11 230 lb 6.1 oz (104.5 kg)  06/01/11 238 lb (107.956 kg)  05/23/11 244 lb 0.8 oz (110.7 kg)  05/03/11 200 lb (90.719 kg)   Pt admitted with COPD exacerbation.   Wt hx reveals hx of progressive weight gain. Noted 3% wt loss x 3 months, which is not clinically significant.   Body mass index is 39 kg/(m^2). Patient meets criteria for obesity, class II based on current BMI.   Current diet order is regular, patient is consuming approximately 100% of meals at this time. Labs and medications reviewed.   No nutrition interventions warranted at this time. If nutrition issues arise, please consult RD.   Nazareth Kirk A. Mayford Knife, RD, LDN Pager: [phone]

## 2013-05-07 NOTE — Consult Note (Signed)
Consult requested by: Dr. Karilyn Cota Consult requested for respiratory failure:  HPI: This is a 57 year old with known history of COPD who presented to the emergency room after a several day history of increasing shortness of breath cough and congestion. She was treated in the emergency department and was not able to be discharged because she continued having symptoms. She was found to be in acute respiratory failure. She has a previous history of pretty severe COPD and required intubation and mechanical ventilation about a year ago. She feels somewhat better now. She has been coughing up a lot of sputum. She denies any fever or chills.  Past Medical History  Diagnosis Date  . Fibromyalgia   . Asthma   . DJD (degenerative joint disease)   . Chronic pain   . Depression   . Anxiety   . Panic attack   . Sleep disorder   . DJD (degenerative joint disease)   . GERD (gastroesophageal reflux disease)   . Hyperlipidemia   . COPD (chronic obstructive pulmonary disease)      Family History  Problem Relation Age of Onset  . Cancer Mother     beast     History   Social History  . Marital Status: Divorced    Spouse Name: N/A    Number of Children: N/A  . Years of Education: N/A   Occupational History  . disabled    Social History Main Topics  . Smoking status: Current Every Day Smoker -- 1.00 packs/day for 8 years    Types: Cigarettes  . Smokeless tobacco: Never Used  . Alcohol Use: No     Comment: occ  . Drug Use: No  . Sexual Activity: Yes    Birth Control/ Protection: Surgical   Other Topics Concern  . None   Social History Narrative  . None     ROS: She has not had fever or chills she does say that her left leg has been swelling chronically. She has not had chest pain. No hemoptysis.    Objective: Vital signs in last 24 hours: Temp:  [97.9 F (36.6 C)-98.5 F (36.9 C)] 98.4 F (36.9 C) (09/08 0400) Pulse Rate:  [76-90] 86 (09/08 0313) Resp:  [11-23] 16 (09/08  0500) BP: (99-149)/(70-93) 129/87 mmHg (09/08 0500) SpO2:  [85 %-97 %] 91 % (09/08 0722) FiO2 (%):  [35 %] 35 % (09/08 0313) Weight:  [103.1 kg (227 lb 4.7 oz)] 103.1 kg (227 lb 4.7 oz) (09/08 0500) Weight change: -3.042 kg (-6 lb 11.3 oz) Last BM Date: 05/04/13  Intake/Output from previous day: 09/07 0701 - 09/08 0700 In: 1180 [P.O.:1080; IV Piggyback:100] Out: 1500 [Urine:1500]  PHYSICAL EXAM She is awake and alert and able to finish sentences. Her pupils are reactive. Nose and throat are clear. Neck is supple without masses. Her chest shows rhonchi bilaterally and prolonged expiration. Her heart is regular without gallop. Her abdomen is soft. She has 1+ edema of the left leg and trace of the right. Central nervous system examination is grossly intact  Lab Results: Basic Metabolic Panel:  Recent Labs  40/10/27 1553 05/06/13 0519  NA 138 140  K 3.9 4.1  CL 95* 92*  CO2 38* 39*  GLUCOSE 166* 230*  BUN 6 9  CREATININE 0.66 0.65  CALCIUM 9.2 9.1   Liver Function Tests:  Recent Labs  05/05/13 1553 05/06/13 0519  AST 13 17  ALT 10 14  ALKPHOS 112 118*  BILITOT 0.2* 0.2*  PROT 6.9 7.3  ALBUMIN 3.0* 3.0*   No results found for this basename: LIPASE, AMYLASE,  in the last 72 hours No results found for this basename: AMMONIA,  in the last 72 hours CBC:  Recent Labs  05/05/13 1553 05/06/13 0519  WBC 9.1 6.8  NEUTROABS 5.1  --   HGB 13.6 13.8  HCT 45.8 47.6*  MCV 94.0 94.3  PLT 236 256   Cardiac Enzymes:  Recent Labs  05/05/13 1553  TROPONINI <0.30   BNP:  Recent Labs  05/05/13 1553  PROBNP 3032.0*   D-Dimer: No results found for this basename: DDIMER,  in the last 72 hours CBG:  Recent Labs  05/05/13 1600  GLUCAP 139*   Hemoglobin A1C: No results found for this basename: HGBA1C,  in the last 72 hours Fasting Lipid Panel: No results found for this basename: CHOL, HDL, LDLCALC, TRIG, CHOLHDL, LDLDIRECT,  in the last 72 hours Thyroid Function  Tests: No results found for this basename: TSH, T4TOTAL, FREET4, T3FREE, THYROIDAB,  in the last 72 hours Anemia Panel: No results found for this basename: VITAMINB12, FOLATE, FERRITIN, TIBC, IRON, RETICCTPCT,  in the last 72 hours Coagulation: No results found for this basename: LABPROT, INR,  in the last 72 hours Urine Drug Screen: Drugs of Abuse  No results found for this basename: labopia, cocainscrnur, labbenz, amphetmu, thcu, labbarb    Alcohol Level: No results found for this basename: ETH,  in the last 72 hours Urinalysis: No results found for this basename: COLORURINE, APPERANCEUR, LABSPEC, PHURINE, GLUCOSEU, HGBUR, BILIRUBINUR, KETONESUR, PROTEINUR, UROBILINOGEN, NITRITE, LEUKOCYTESUR,  in the last 72 hours Misc. Labs:   ABGS:  Recent Labs  05/07/13 0518  PHART 7.389  PO2ART 58.5*  TCO2 37.1  HCO3 41.4*     MICROBIOLOGY: Recent Results (from the past 240 hour(s))  MRSA PCR SCREENING     Status: Abnormal   Collection Time    05/05/13  7:00 PM      Result Value Range Status   MRSA by PCR POSITIVE (*) NEGATIVE Final   Comment:            The GeneXpert MRSA Assay (FDA     approved for NASAL specimens     only), is one component of a     comprehensive MRSA colonization     surveillance program. It is not     intended to diagnose MRSA     infection nor to guide or     monitor treatment for     MRSA infections.     CRITICAL RESULT CALLED TO, READ BACK BY AND VERIFIED WITH:     M. GRAY AT 2346 ON 05/05/13 BY Wynonia Lawman    Studies/Results: Dg Chest Port 1 View  05/05/2013   *RADIOLOGY REPORT*  Clinical Data: Shortness of breath and respiratory distress  PORTABLE CHEST - 1 VIEW  Comparison: 02/06/2013  Findings: Mildly degraded exam due to AP portable technique and patient body habitus.  Midline trachea.  Cardiomegaly accentuated by AP portable technique.  No definite pleural fluid. No pneumothorax.  Mild interstitial thickening is accentuated by low lung volumes.   No definite airspace consolidation.  Soft tissues project over the lung bases bilaterally.  IMPRESSION: Cardiomegaly and pulmonary venous congestion.  Low lung volumes.   Original Report Authenticated By: Jeronimo Greaves, M.D.    Medications:  Scheduled: . albuterol  5 mg Nebulization Q4H  . atorvastatin  20 mg Oral q1800  . budesonide-formoterol  2 puff Inhalation BID  . buPROPion  150 mg Oral q morning - 10a  . Chlorhexidine Gluconate Cloth  6 each Topical Q0600  . diclofenac  75 mg Oral BID  . DULoxetine  60 mg Oral Daily  . furosemide  40 mg Intravenous Daily  . heparin  5,000 Units Subcutaneous Q8H  . levofloxacin (LEVAQUIN) IV  500 mg Intravenous Q24H  . methylPREDNISolone (SOLU-MEDROL) injection  125 mg Intravenous Q12H  . montelukast  10 mg Oral QHS  . mupirocin ointment  1 application Nasal BID  . olopatadine  1 drop Both Eyes BID  . OxyCODONE  15 mg Oral Q12H  . pantoprazole  40 mg Oral Daily  . sodium chloride  3 mL Intravenous Q12H  . tiotropium  18 mcg Inhalation Daily   Continuous:  ZOX:[id number], benzonatate, diazepam, diclofenac sodium, LORazepam, oxyCODONE  Assesment: She was admitted with acute on chronic respiratory failure with COPD exacerbation. She has had a severe enough exacerbation and she required intubation and mechanical ventilation. Her blood gases have improved today. Active Problems:   Smoking   Obesity   COPD (chronic obstructive pulmonary disease)   Acute-on-chronic respiratory failure    Plan: Continue with current treatments. She does seem to be getting better.    LOS: 2 days   Louiza Moor L 05/07/2013, 8:08 AM

## 2013-05-07 NOTE — Progress Notes (Signed)
Bethany Villa WGN:[id number] DOB: 07/25/1956 DOA: 05/05/2013 PCP: Rudi Heap, MD   Subjective: This lady appears to be improving. Her blood gas without BiPAP is more acceptable this morning. She feels better overall.           Physical Exam: Blood pressure 129/87, pulse 86, temperature 98.4 F (36.9 C), temperature source Axillary, resp. rate 16, height 5\' 4"  (1.626 m), weight 103.1 kg (227 lb 4.7 oz), SpO2 91.00%. She looks better. Lung fields show very little wheezing. There are a few scattered crackles. She is alert and orientated.   Investigations:  Recent Results (from the past 240 hour(s))  MRSA PCR SCREENING     Status: Abnormal   Collection Time    05/05/13  7:00 PM      Result Value Range Status   MRSA by PCR POSITIVE (*) NEGATIVE Final   Comment:            The GeneXpert MRSA Assay (FDA     approved for NASAL specimens     only), is one component of a     comprehensive MRSA colonization     surveillance program. It is not     intended to diagnose MRSA     infection nor to guide or     monitor treatment for     MRSA infections.     CRITICAL RESULT CALLED TO, READ BACK BY AND VERIFIED WITH:     M. GRAY AT 2346 ON 05/05/13 BY Wynonia Lawman     Basic Metabolic Panel:  Recent Labs  78/46/96 1553 05/06/13 0519  NA 138 140  K 3.9 4.1  CL 95* 92*  CO2 38* 39*  GLUCOSE 166* 230*  BUN 6 9  CREATININE 0.66 0.65  CALCIUM 9.2 9.1   Liver Function Tests:  Recent Labs  05/05/13 1553 05/06/13 0519  AST 13 17  ALT 10 14  ALKPHOS 112 118*  BILITOT 0.2* 0.2*  PROT 6.9 7.3  ALBUMIN 3.0* 3.0*     CBC:  Recent Labs  05/05/13 1553 05/06/13 0519  WBC 9.1 6.8  NEUTROABS 5.1  --   HGB 13.6 13.8  HCT 45.8 47.6*  MCV 94.0 94.3  PLT 236 256    Dg Chest Port 1 View  05/05/2013   *RADIOLOGY REPORT*  Clinical Data: Shortness of breath and respiratory distress  PORTABLE CHEST - 1 VIEW  Comparison: 02/06/2013  Findings: Mildly degraded exam due to AP  portable technique and patient body habitus.  Midline trachea.  Cardiomegaly accentuated by AP portable technique.  No definite pleural fluid. No pneumothorax.  Mild interstitial thickening is accentuated by low lung volumes.  No definite airspace consolidation.  Soft tissues project over the lung bases bilaterally.  IMPRESSION: Cardiomegaly and pulmonary venous congestion.  Low lung volumes.   Original Report Authenticated By: Jeronimo Greaves, M.D.      Medications: I have reviewed the patient's current medications.  Impression: 1. COPD exacerbation, clinically improving. 2. Acute on chronic respiratory failure 2 secondary to #1. 3. Chronic back pain on opioids. 4. Obesity. 5. Tobacco abuse.     Plan: 1. Reduce IV steroids. 2. Continue with other therapy.  Consultants:   none.   Procedures:  None.   Antibiotics:  Intravenous Levaquin started 05/05/2013.                   Code Status: Full code.  Family Communication: Discussed plan with patient at the bedside.   Disposition Plan: Home when medically  stable.  Time spent: 20 minutes.   LOS: 2 days   Wilson Singer Pager [phone]  05/07/2013, 8:24 AM

## 2013-05-08 ENCOUNTER — Other Ambulatory Visit: Payer: Self-pay | Admitting: Nurse Practitioner

## 2013-05-08 DIAGNOSIS — G894 Chronic pain syndrome: Secondary | ICD-10-CM | POA: Diagnosis not present

## 2013-05-08 DIAGNOSIS — IMO0001 Reserved for inherently not codable concepts without codable children: Secondary | ICD-10-CM

## 2013-05-08 DIAGNOSIS — I509 Heart failure, unspecified: Secondary | ICD-10-CM | POA: Diagnosis not present

## 2013-05-08 DIAGNOSIS — F329 Major depressive disorder, single episode, unspecified: Secondary | ICD-10-CM

## 2013-05-08 DIAGNOSIS — J9600 Acute respiratory failure, unspecified whether with hypoxia or hypercapnia: Secondary | ICD-10-CM | POA: Diagnosis not present

## 2013-05-08 DIAGNOSIS — J441 Chronic obstructive pulmonary disease with (acute) exacerbation: Secondary | ICD-10-CM | POA: Diagnosis not present

## 2013-05-08 MED ORDER — OXYCODONE HCL ER 15 MG PO T12A: 15.0000 mg | EXTENDED_RELEASE_TABLET | Freq: Two times a day (BID) | ORAL | Status: DC

## 2013-05-08 MED ORDER — PREDNISONE 20 MG PO TABS: ORAL_TABLET | ORAL | Status: DC

## 2013-05-08 MED ORDER — LEVOFLOXACIN 500 MG PO TABS: 500.0000 mg | ORAL_TABLET | Freq: Every day | ORAL | Status: AC

## 2013-05-08 MED ORDER — OXYCODONE HCL 15 MG PO TABS: 15.0000 mg | ORAL_TABLET | Freq: Four times a day (QID) | ORAL | Status: DC | PRN

## 2013-05-08 NOTE — Progress Notes (Signed)
Pt expressed interest on smoking cessation. Talked with Santa Genera, Clinical Social Worker on getting patient in touch with some assistance once she is discharged.

## 2013-05-08 NOTE — Discharge Summary (Signed)
Physician Discharge Summary  [name]:[id number] DOB: 12-19-1955 DOA: 05/05/2013  PCP: Rudi Heap, MD  Admit date: 05/05/2013 Discharge date: 05/08/2013  Time spent: Greater than 30 minutes  Recommendations for Outpatient Follow-up:  1. Follow with primary care physician within one week.   Discharge Diagnoses:  1. COPD exacerbation. 2. Diastolic congestive heart failure, compensated. 3. Type II respiratory failure secondary to #1 #2. 4. Obesity. 5. Ongoing tobacco abuse. 6. Insulin resistance and glucose intolerance.    Discharge Condition: Stable.  Diet recommendation: Carbohydrate modified diet.  Filed Weights   05/06/13 0500 05/07/13 0500 05/08/13 0500  Weight: 102.7 kg (226 lb 6.6 oz) 103.1 kg (227 lb 4.7 oz) 103.1 kg (227 lb 4.7 oz)    History of present illness:  This 57 year old lady, who is a long-standing smoker,, presented to the hospital with symptoms of dyspnea and productive cough. Please see initial history as outlined below: HPI: Bethany Villa is a 57 y.o. female presents with a 2 to three-week history of dyspnea associated with a green productive cough. She also has been subjectively feeling feverish. When she was hospitalized approximately 6 months ago for COPD exacerbation, she required intubation and mechanical ventilation. She has been treated aggressively in the emergency room but still continues to have dyspnea and desaturation. She is on 100% nonrebreather mask at this point. She is able to talk in sentences and give me a history. She denies any chest pain, palpitations.  Unfortunately, she still continues to smoke one pack of cigarettes per day.  Hospital Course:  The patient was in respiratory failure when she presented and she was on 100% nonrebreather initially. She also required BiPAP for a while. She was given intravenous steroids, intravenous antibiotics and bronchodilators. Fortunately we were able to avoid intubation and mechanical  ventilation. Her blood gases improved and she is now back to using her usual oxygen of 3-3-1/2 L per minute. There was an element of congestive heart failure with her presentation and she was given intravenous diuretics during her hospitalization. She feels back almost to her baseline now and she is stable for discharge. She will be discharged on tapering course of prednisone as well as another week of oral antibiotics. I've urged her to see her primary care physician within a week. We also discussed at length the harm of smoking cigarettes and I counseled her on this. She was seen by pulmonologist, Dr. Juanetta Gosling throughout her stay and he agreed with management. Also during her hospitalization I have made changes in her pain medications. I've given her long-acting OxyContin and she can use oxycodone for breakthrough only.  Procedures:  None.  Consultations:  Pulmonary, Dr. Juanetta Gosling.  Discharge Exam: Filed Vitals:   05/08/13 0500  BP: 101/65  Pulse: 98  Temp:   Resp: 13    General: She looks chronically sick but not acutely unwell at this present time. There is no increased work of breathing at rest. There is no peripheral central cyanosis. She is obese. Cardiovascular: Heart sounds are present in sinus rhythm without any murmurs or added sounds. There is no clinical heart failure today. Respiratory: Lung fields show scattered wheezing but much improved than when she presented to the hospital and better than yesterday. There are no crackles. There is no broken breathing. She is alert and orientated.  Discharge Instructions  Discharge Orders   Future Orders Complete By Expires   Diet - low sodium heart healthy  As directed    Increase activity slowly  As  directed        Medication List    STOP taking these medications       azithromycin 250 MG tablet  Commonly known as:  ZITHROMAX Z-PAK      TAKE these medications       atorvastatin 20 MG tablet  Commonly known as:  LIPITOR   Take 1 tablet (20 mg total) by mouth daily.     benzonatate 100 MG capsule  Commonly known as:  TESSALON  Take 100 mg by mouth 2 (two) times daily as needed for cough.     budesonide-formoterol 160-4.5 MCG/ACT inhaler  Commonly known as:  SYMBICORT  Inhale 2 puffs into the lungs 2 (two) times daily.     buPROPion 150 MG 24 hr tablet  Commonly known as:  WELLBUTRIN XL  Take 1 tablet (150 mg total) by mouth every morning.     diazepam 5 MG tablet  Commonly known as:  VALIUM  Take 5 mg by mouth every 12 (twelve) hours as needed for anxiety.     diclofenac 75 MG EC tablet  Commonly known as:  VOLTAREN  Take 75 mg by mouth 2 (two) times daily.     diclofenac sodium 1 % Gel  Commonly known as:  VOLTAREN  Apply 1 application topically 4 (four) times daily as needed. To knee for pain     DULoxetine 60 MG capsule  Commonly known as:  CYMBALTA  Take 1 capsule (60 mg total) by mouth daily.     fluticasone 50 MCG/ACT nasal spray  Commonly known as:  FLONASE  Place 2 sprays into the nose daily.     furosemide 40 MG tablet  Commonly known as:  LASIX  Take 1 tablet (40 mg total) by mouth daily.     ibuprofen 800 MG tablet  Commonly known as:  ADVIL,MOTRIN  Take 800 mg by mouth every 6 (six) hours as needed for pain.     levalbuterol 45 MCG/ACT inhaler  Commonly known as:  XOPENEX HFA  Inhale 1-2 puffs into the lungs every 4 (four) hours as needed. For shortness of breath     levofloxacin 500 MG tablet  Commonly known as:  LEVAQUIN  Take 1 tablet (500 mg total) by mouth daily.     montelukast 10 MG tablet  Commonly known as:  SINGULAIR  Take 1 tablet (10 mg total) by mouth at bedtime.     olopatadine 0.1 % ophthalmic solution  Commonly known as:  PATANOL  Place 1 drop into both eyes 2 (two) times daily.     omeprazole 20 MG capsule  Commonly known as:  PRILOSEC  Take 1 capsule (20 mg total) by mouth daily.     OxyCODONE 15 mg T12a 12 hr tablet  Commonly known as:   OXYCONTIN  Take 1 tablet (15 mg total) by mouth every 12 (twelve) hours.     oxyCODONE 15 MG immediate release tablet  Commonly known as:  ROXICODONE  Take 1 tablet (15 mg total) by mouth every 6 (six) hours as needed.     predniSONE 20 MG tablet  Commonly known as:  DELTASONE  Take 2 tablets daily for 3 days, then 1 tablet daily for 3 days, then half tablet daily for 3 days, then STOP.     PROAIR HFA 108 (90 BASE) MCG/ACT inhaler  Generic drug:  albuterol  Inhale 2 puffs into the lungs every 6 (six) hours as needed for wheezing or shortness of breath.  albuterol (2.5 MG/3ML) 0.083% nebulizer solution  Commonly known as:  PROVENTIL  Take 2.5 mg by nebulization every 6 (six) hours as needed for wheezing or shortness of breath.     tiotropium 18 MCG inhalation capsule  Commonly known as:  SPIRIVA  Place 18 mcg into inhaler and inhale daily.     zolpidem 10 MG tablet  Commonly known as:  AMBIEN  Take 10 mg by mouth at bedtime as needed for sleep.       Allergies  Allergen Reactions  . Mobic [Meloxicam] Anaphylaxis  . Penicillins Shortness Of Breath and Swelling  . Sulfa Antibiotics Shortness Of Breath and Swelling       Follow-up Information   Follow up with Rudi Heap, MD. Schedule an appointment as soon as possible for a visit in 1 week.   Specialty:  Family Medicine   Contact information:   [address] [place] [address] [phone]        The results of significant diagnostics from this hospitalization (including imaging, microbiology, ancillary and laboratory) are listed below for reference.    Significant Diagnostic Studies: Dg Chest Port 1 View  05/07/2013   CLINICAL DATA:  57 year old female shortness of Breath. Possible congestive heart failure.  EXAM: PORTABLE CHEST - 1 VIEW  COMPARISON:  05/05/2013 and earlier.  FINDINGS: Portable AP upright view at 0845 hrs. Mildly lower lung volumes. Stable cardiac size and mediastinal contours. Insert past  trachea no pneumothorax. No large effusion or consolidation. No overt pulmonary edema. Interval decreased pulmonary vascular congestion.  IMPRESSION: Interval decrease vascular congestion. No overt pulmonary edema or acute cardiopulmonary abnormality.   Electronically Signed   By: Augusto Gamble M.D.   On: 05/07/2013 09:21   Dg Chest Port 1 View  05/05/2013   *RADIOLOGY REPORT*  Clinical Data: Shortness of breath and respiratory distress  PORTABLE CHEST - 1 VIEW  Comparison: 02/06/2013  Findings: Mildly degraded exam due to AP portable technique and patient body habitus.  Midline trachea.  Cardiomegaly accentuated by AP portable technique.  No definite pleural fluid. No pneumothorax.  Mild interstitial thickening is accentuated by low lung volumes.  No definite airspace consolidation.  Soft tissues project over the lung bases bilaterally.  IMPRESSION: Cardiomegaly and pulmonary venous congestion.  Low lung volumes.   Original Report Authenticated By: Jeronimo Greaves, M.D.    Microbiology: Recent Results (from the past 240 hour(s))  MRSA PCR SCREENING     Status: Abnormal   Collection Time    05/05/13  7:00 PM      Result Value Range Status   MRSA by PCR POSITIVE (*) NEGATIVE Final   Comment:            The GeneXpert MRSA Assay (FDA     approved for NASAL specimens     only), is one component of a     comprehensive MRSA colonization     surveillance program. It is not     intended to diagnose MRSA     infection nor to guide or     monitor treatment for     MRSA infections.     CRITICAL RESULT CALLED TO, READ BACK BY AND VERIFIED WITH:     M. GRAY AT 2346 ON 05/05/13 BY Wynonia Lawman     Labs: Basic Metabolic Panel:  Recent Labs Lab 05/05/13 1553 05/06/13 0519  NA 138 140  K 3.9 4.1  CL 95* 92*  CO2 38* 39*  GLUCOSE 166* 230*  BUN  6 9  CREATININE 0.66 0.65  CALCIUM 9.2 9.1   Liver Function Tests:  Recent Labs Lab 05/05/13 1553 05/06/13 0519  AST 13 17  ALT 10 14  ALKPHOS 112 118*   BILITOT 0.2* 0.2*  PROT 6.9 7.3  ALBUMIN 3.0* 3.0*     CBC:  Recent Labs Lab 05/05/13 1553 05/06/13 0519  WBC 9.1 6.8  NEUTROABS 5.1  --   HGB 13.6 13.8  HCT 45.8 47.6*  MCV 94.0 94.3  PLT 236 256   Cardiac Enzymes:  Recent Labs Lab 05/05/13 1553  TROPONINI <0.30   BNP: BNP (last 3 results)  Recent Labs  11/27/12 1534 05/05/13 1553  PROBNP 228.4* 3032.0*   CBG:  Recent Labs Lab 05/05/13 1600  GLUCAP 139*       Signed:  Simya Tercero C  [facility] 05/08/2013, 8:42 AM

## 2013-05-08 NOTE — Progress Notes (Signed)
Pt educated on all new medications, which medications to stop and which to continue. Also went over any at home needs and when to follow up with her primary care physician. Patient insisted on gathering all her belongings herself and I did get personal items out of closet. Pt discharged via wheelchair to a friend's personal vehicle.

## 2013-05-08 NOTE — Clinical Social Work Psychosocial (Signed)
    Clinical Social Work Department BRIEF PSYCHOSOCIAL ASSESSMENT 05/08/2013  Patient:  Bethany Villa, Bethany Villa     Account Number:  [id number]     Admit date:  05/05/2013  Clinical Social Worker:  Santa Genera, CLINICAL SOCIAL WORKER  Date/Time:  05/08/2013 10:30 AM  Referred by:  RN  Date Referred:  05/08/2013 Referred for  Other - See comment   Other Referral:   Smoking cessation information   Interview type:  Patient Other interview type:    PSYCHOSOCIAL DATA Living Status:  FAMILY Admitted from facility:   Level of care:   Primary support name:  Diamond Nickel Primary support relationship to patient:  FRIEND Degree of support available:   Adequate    CURRENT CONCERNS Current Concerns  Other - See comment   Other Concerns:   smoking cessation    SOCIAL WORK ASSESSMENT / PLAN CSW received request from RN re patient wanting to quit smoking due to COPD and home O2 use.  Patient wanted help w nicotine patches.  Discussed w MD, referred to [facility], gave info to patient, faxed referral to Quit[facility] for their help.  Explained process to patient.   Assessment/plan status:  Referral to [facility] Other assessment/ plan:   Information/referral to community resources:   [place] [facility]uit[facility] referral    PATIENT'S/FAMILY'S RESPONSE TO PLAN OF CARE: Patient appreciative and states intention to quit smoking within next 30 days.        Santa Genera, LCSW Clinical Social Worker [phone])

## 2013-05-08 NOTE — Progress Notes (Signed)
Subjective: She says she feels better. She has some cough and congestion but otherwise is feeling okay  Objective: Vital signs in last 24 hours: Temp:  [97.8 F (36.6 C)-98.3 F (36.8 C)] 98.1 F (36.7 C) (09/09 0000) Pulse Rate:  [59-110] 98 (09/09 0500) Resp:  [13-27] 13 (09/09 0500) BP: (77-130)/(54-87) 101/65 mmHg (09/09 0500) SpO2:  [81 %-96 %] 96 % (09/09 0739) FiO2 (%):  [32 %-36 %] 36 % (09/09 0415) Weight:  [103.1 kg (227 lb 4.7 oz)] 103.1 kg (227 lb 4.7 oz) (09/09 0500) Weight change: 0 kg (0 lb) Last BM Date: 05/04/13  Intake/Output from previous day: 09/08 0701 - 09/09 0700 In: 350 [P.O.:250; IV Piggyback:100] Out: 1750 [Urine:1750]  PHYSICAL EXAM General appearance: alert, cooperative, no distress and moderately obese Resp: rhonchi bilaterally Cardio: regular rate and rhythm, S1, S2 normal, no murmur, click, rub or gallop GI: soft, non-tender; bowel sounds normal; no masses,  no organomegaly Extremities: extremities normal, atraumatic, no cyanosis or edema  Lab Results:    Basic Metabolic Panel:  Recent Labs  47/42/59 1553 05/06/13 0519  NA 138 140  K 3.9 4.1  CL 95* 92*  CO2 38* 39*  GLUCOSE 166* 230*  BUN 6 9  CREATININE 0.66 0.65  CALCIUM 9.2 9.1   Liver Function Tests:  Recent Labs  05/05/13 1553 05/06/13 0519  AST 13 17  ALT 10 14  ALKPHOS 112 118*  BILITOT 0.2* 0.2*  PROT 6.9 7.3  ALBUMIN 3.0* 3.0*   No results found for this basename: LIPASE, AMYLASE,  in the last 72 hours No results found for this basename: AMMONIA,  in the last 72 hours CBC:  Recent Labs  05/05/13 1553 05/06/13 0519  WBC 9.1 6.8  NEUTROABS 5.1  --   HGB 13.6 13.8  HCT 45.8 47.6*  MCV 94.0 94.3  PLT 236 256   Cardiac Enzymes:  Recent Labs  05/05/13 1553  TROPONINI <0.30   BNP:  Recent Labs  05/05/13 1553  PROBNP 3032.0*   D-Dimer: No results found for this basename: DDIMER,  in the last 72 hours CBG:  Recent Labs  05/05/13 1600   GLUCAP 139*   Hemoglobin A1C: No results found for this basename: HGBA1C,  in the last 72 hours Fasting Lipid Panel: No results found for this basename: CHOL, HDL, LDLCALC, TRIG, CHOLHDL, LDLDIRECT,  in the last 72 hours Thyroid Function Tests: No results found for this basename: TSH, T4TOTAL, FREET4, T3FREE, THYROIDAB,  in the last 72 hours Anemia Panel: No results found for this basename: VITAMINB12, FOLATE, FERRITIN, TIBC, IRON, RETICCTPCT,  in the last 72 hours Coagulation: No results found for this basename: LABPROT, INR,  in the last 72 hours Urine Drug Screen: Drugs of Abuse  No results found for this basename: labopia, cocainscrnur, labbenz, amphetmu, thcu, labbarb    Alcohol Level: No results found for this basename: ETH,  in the last 72 hours Urinalysis: No results found for this basename: COLORURINE, APPERANCEUR, LABSPEC, PHURINE, GLUCOSEU, HGBUR, BILIRUBINUR, KETONESUR, PROTEINUR, UROBILINOGEN, NITRITE, LEUKOCYTESUR,  in the last 72 hours Misc. Labs:  ABGS  Recent Labs  05/07/13 0518  PHART 7.389  PO2ART 58.5*  TCO2 37.1  HCO3 41.4*   CULTURES Recent Results (from the past 240 hour(s))  MRSA PCR SCREENING     Status: Abnormal   Collection Time    05/05/13  7:00 PM      Result Value Range Status   MRSA by PCR POSITIVE (*) NEGATIVE Final  Comment:            The GeneXpert MRSA Assay (FDA     approved for NASAL specimens     only), is one component of a     comprehensive MRSA colonization     surveillance program. It is not     intended to diagnose MRSA     infection nor to guide or     monitor treatment for     MRSA infections.     CRITICAL RESULT CALLED TO, READ BACK BY AND VERIFIED WITH:     M. GRAY AT 2346 ON 05/05/13 BY Wynonia Lawman   Studies/Results: Dg Chest Port 1 View  05/07/2013   CLINICAL DATA:  57 year old female shortness of Breath. Possible congestive heart failure.  EXAM: PORTABLE CHEST - 1 VIEW  COMPARISON:  05/05/2013 and earlier.   FINDINGS: Portable AP upright view at 0845 hrs. Mildly lower lung volumes. Stable cardiac size and mediastinal contours. Insert past trachea no pneumothorax. No large effusion or consolidation. No overt pulmonary edema. Interval decreased pulmonary vascular congestion.  IMPRESSION: Interval decrease vascular congestion. No overt pulmonary edema or acute cardiopulmonary abnormality.   Electronically Signed   By: Augusto Gamble M.D.   On: 05/07/2013 09:21    Medications:  Scheduled: . albuterol  5 mg Nebulization Q4H  . atorvastatin  20 mg Oral q1800  . budesonide-formoterol  2 puff Inhalation BID  . buPROPion  150 mg Oral q morning - 10a  . Chlorhexidine Gluconate Cloth  6 each Topical Q0600  . diclofenac  75 mg Oral BID  . DULoxetine  60 mg Oral Daily  . furosemide  40 mg Intravenous Daily  . heparin  5,000 Units Subcutaneous Q8H  . levofloxacin (LEVAQUIN) IV  500 mg Intravenous Q24H  . methylPREDNISolone (SOLU-MEDROL) injection  125 mg Intravenous Q12H  . montelukast  10 mg Oral QHS  . mupirocin ointment  1 application Nasal BID  . nicotine  21 mg Transdermal Daily  . olopatadine  1 drop Both Eyes BID  . OxyCODONE  15 mg Oral Q12H  . pantoprazole  40 mg Oral Daily  . sodium chloride  3 mL Intravenous Q12H  . tiotropium  18 mcg Inhalation Daily   Continuous:  HQI:[id number], benzonatate, diazepam, diclofenac sodium, LORazepam, oxyCODONE  Assesment: She has acute on chronic respiratory failure on the basis of severe COPD. She says she has headaches in the mornings so she may have some hypoventilation at night but she did have a sleep study fairly recently that did not show sleep apnea. She is much improved. Active Problems:   Smoking   Obesity   COPD (chronic obstructive pulmonary disease)   Acute-on-chronic respiratory failure    Plan: Continue current treatments.    LOS: 3 days   Aswad Wandrey L 05/08/2013, 8:30 AM

## 2013-05-09 ENCOUNTER — Other Ambulatory Visit: Payer: Self-pay | Admitting: Nurse Practitioner

## 2013-05-10 NOTE — Telephone Encounter (Signed)
LAST OV 02/06/13.

## 2013-05-16 NOTE — Progress Notes (Signed)
This encounter was created in error - please disregard.

## 2013-05-21 ENCOUNTER — Telehealth: Payer: Self-pay | Admitting: Nurse Practitioner

## 2013-05-21 MED ORDER — DIAZEPAM 5 MG PO TABS: 5.0000 mg | ORAL_TABLET | Freq: Two times a day (BID) | ORAL | Status: DC | PRN

## 2013-05-21 NOTE — Telephone Encounter (Signed)
Please call in diazepam 5mg  1 PO BID #60 0 refills

## 2013-05-22 NOTE — Telephone Encounter (Signed)
Lm for pt, script called in.

## 2013-05-23 NOTE — Progress Notes (Signed)
UR Chart Review Completed  

## 2013-05-29 ENCOUNTER — Other Ambulatory Visit: Payer: Self-pay

## 2013-05-29 MED ORDER — ALBUTEROL SULFATE HFA 108 (90 BASE) MCG/ACT IN AERS: 2.0000 | INHALATION_SPRAY | Freq: Four times a day (QID) | RESPIRATORY_TRACT | Status: DC | PRN

## 2013-06-08 ENCOUNTER — Other Ambulatory Visit: Payer: Self-pay | Admitting: Nurse Practitioner

## 2013-06-12 ENCOUNTER — Telehealth: Payer: Self-pay | Admitting: Nurse Practitioner

## 2013-06-12 DIAGNOSIS — G4733 Obstructive sleep apnea (adult) (pediatric): Secondary | ICD-10-CM

## 2013-06-12 NOTE — Telephone Encounter (Signed)
rx sent to [facility] 

## 2013-06-14 NOTE — Telephone Encounter (Signed)
Lm with details.

## 2013-06-25 ENCOUNTER — Telehealth: Payer: Self-pay | Admitting: Nurse Practitioner

## 2013-06-26 NOTE — Telephone Encounter (Signed)
PT WANTED REFILL ON PAIN MEDICINE AND HAS BEEN GETTING MEDICINE FROM [facility]. PT WANTING TO SEE NEW PAIN CLINIC DOCTOR IN [place]. PT WANTS [place] TO WRITE UNTIL THEN. PER MMM SHE WILL NOT WRITE RX FOR PAIN MED. MUST GET PAIN MEDICINE FROM [facility] UNTIL SEES NEW PAIN CLINIC. PT VERBALIZED UNDERSTANDING.

## 2013-06-28 ENCOUNTER — Telehealth: Payer: Self-pay | Admitting: Nurse Practitioner

## 2013-06-28 ENCOUNTER — Other Ambulatory Visit: Payer: Self-pay | Admitting: Nurse Practitioner

## 2013-06-28 MED ORDER — DIAZEPAM 5 MG PO TABS: 5.0000 mg | ORAL_TABLET | Freq: Two times a day (BID) | ORAL | Status: DC | PRN

## 2013-06-28 NOTE — Telephone Encounter (Signed)
rx ready for pickup 

## 2013-06-28 NOTE — Telephone Encounter (Signed)
Called up front

## 2013-06-28 NOTE — Telephone Encounter (Signed)
Patient aware it is up front to pick up

## 2013-06-30 ENCOUNTER — Other Ambulatory Visit: Payer: Self-pay | Admitting: Nurse Practitioner

## 2013-07-02 ENCOUNTER — Telehealth: Payer: Self-pay | Admitting: Nurse Practitioner

## 2013-07-02 NOTE — Telephone Encounter (Signed)
This was done 06/08/13 for #90

## 2013-07-03 ENCOUNTER — Telehealth: Payer: Self-pay | Admitting: Nurse Practitioner

## 2013-07-03 DIAGNOSIS — M797 Fibromyalgia: Secondary | ICD-10-CM

## 2013-07-03 NOTE — Telephone Encounter (Signed)
referal made

## 2013-07-03 NOTE — Telephone Encounter (Signed)
Will have to wait till see pain management- motrin 800mg  OTC

## 2013-07-05 ENCOUNTER — Other Ambulatory Visit: Payer: Self-pay

## 2013-07-05 DIAGNOSIS — M1710 Unilateral primary osteoarthritis, unspecified knee: Secondary | ICD-10-CM | POA: Diagnosis not present

## 2013-07-05 NOTE — Telephone Encounter (Signed)
Pt notified and verbalized understanding.

## 2013-07-16 ENCOUNTER — Telehealth: Payer: Self-pay | Admitting: Nurse Practitioner

## 2013-07-16 NOTE — Telephone Encounter (Signed)
APPT ON 11/25 WITH [facility]

## 2013-07-20 ENCOUNTER — Telehealth: Payer: Self-pay | Admitting: Nurse Practitioner

## 2013-07-20 NOTE — Telephone Encounter (Signed)
She wants something for back pain she is extreme pain and is unable to do anything due to being in so much pain. She is waiting to get in another pain clinic. She just refilled her muscle relaxers and taking ibuprofen 800 mg but it isnt helping. She doesn't care if it is even oxycotonin. vicodin doesn't help. She says that it hurts to even get up to go to the bathroom or wash dished

## 2013-07-22 NOTE — Telephone Encounter (Signed)
NTBS.

## 2013-07-23 NOTE — Telephone Encounter (Signed)
I left a detailed message that NTBS.

## 2013-07-24 ENCOUNTER — Encounter: Payer: Self-pay | Admitting: Nurse Practitioner

## 2013-07-24 ENCOUNTER — Ambulatory Visit (INDEPENDENT_AMBULATORY_CARE_PROVIDER_SITE_OTHER): Payer: Medicare Other | Admitting: Nurse Practitioner

## 2013-07-24 VITALS — BP 137/92 | HR 105 | Temp 98.4°F | Ht 64.0 in | Wt 213.0 lb

## 2013-07-24 DIAGNOSIS — J441 Chronic obstructive pulmonary disease with (acute) exacerbation: Secondary | ICD-10-CM

## 2013-07-24 DIAGNOSIS — Z01818 Encounter for other preprocedural examination: Secondary | ICD-10-CM | POA: Diagnosis not present

## 2013-07-24 MED ORDER — HYDROCODONE-ACETAMINOPHEN 5-325 MG PO TABS: 1.0000 | ORAL_TABLET | Freq: Four times a day (QID) | ORAL | Status: DC | PRN

## 2013-07-24 MED ORDER — BENZONATATE 100 MG PO CAPS: 100.0000 mg | ORAL_CAPSULE | Freq: Two times a day (BID) | ORAL | Status: DC | PRN

## 2013-07-24 MED ORDER — AZITHROMYCIN 250 MG PO TABS: ORAL_TABLET | ORAL | Status: DC

## 2013-07-24 NOTE — Progress Notes (Signed)
Subjective:    Patient ID: Bethany Villa, female    DOB: 1956/01/22, 57 y.o.   MRN: [id number]  HPI Patient here today for surgical clearance- she is scheduled to have total knee replacement on 08/28/13 an dis here today for surgical clearance. SHe is doing well othe rthan c/o pain due to many thibngs- she is in the process of changing pain clinics.    Review of Systems  Constitutional: Negative.  Negative for fever and chills.  HENT: Positive for congestion. Negative for rhinorrhea, sinus pressure and sneezing.   Eyes: Negative.   Respiratory: Positive for cough (productive- thick white).   Cardiovascular: Negative.   Gastrointestinal: Negative.   Endocrine: Negative.   Genitourinary: Negative.   Musculoskeletal: Negative.   Neurological: Negative.   Hematological: Negative.   Psychiatric/Behavioral: Negative.        Objective:   Physical Exam  Constitutional: She appears well-developed and well-nourished.  HENT:  Right Ear: External ear normal.  Left Ear: External ear normal.  Nose: Nose normal.  Mouth/Throat: Oropharynx is clear and moist.  Eyes: EOM are normal. Pupils are equal, round, and reactive to light.  Neck: Normal range of motion. Neck supple.  Cardiovascular: Normal rate, regular rhythm and normal heart sounds.   Pulmonary/Chest: Effort normal. She has no wheezes. She has no rales.  Abdominal: Soft. Bowel sounds are normal.  Musculoskeletal: Normal range of motion.  Lymphadenopathy:    She has no cervical adenopathy.  Skin: Skin is warm and dry.  Psychiatric: She has a normal mood and affect. Her behavior is normal. Judgment and thought content normal.    BP 137/92  Pulse 105  Temp(Src) 98.4 F (36.9 C) (Oral)  Ht 5\' 4"  (1.626 m)  Wt 213 lb (96.616 kg)  BMI 36.54 kg/m2       Assessment & Plan:   1. Acute exacerbation of chronic obstructive pulmonary disease (COPD)   2. Preoperative general physical examination    Surgical clearance- but needs  blood work, EKG and chest xray- by anesthesia Recommend breathing treatment prior to surgery. Meds ordered this encounter  Medications  . azithromycin (ZITHROMAX) 250 MG tablet    Sig: As directed    Dispense:  6 each    Refill:  0    Order Specific Question:  Supervising Provider    Answer:  Ernestina Penna [[id number]]  . HYDROcodone-acetaminophen (NORCO/VICODIN) 5-325 MG per tablet    Sig: Take 1 tablet by mouth every 6 (six) hours as needed for moderate pain.    Dispense:  30 tablet    Refill:  0    Order Specific Question:  Supervising Provider    Answer:  Ernestina Penna [[id number]]   1. Take meds as prescribed 2. Use a cool mist humidifier especially during the winter months and when heat has  been humid. 3. Use saline nose sprays frequently 4. Saline irrigations of the nose can be very helpful if done frequently.  * 4X daily for 1 week*  * Use of a nettie pot can be helpful with this. Follow directions with this* 5. Drink plenty of fluids 6. Keep thermostat turn down low 7.For any cough or congestion  Use plain Mucinex- regular strength or max strength is fine   * Children- consult with Pharmacist for dosing 8. For fever or aces or pains- take tylenol or ibuprofen appropriate for age and weight.  * for fevers greater than 101 orally you may alternate ibuprofen and tylenol every  3 hours.  Mary-Margaret Hassell Done, FNP

## 2013-07-24 NOTE — Patient Instructions (Signed)

## 2013-07-25 ENCOUNTER — Other Ambulatory Visit: Payer: Self-pay | Admitting: Nurse Practitioner

## 2013-07-25 ENCOUNTER — Telehealth: Payer: Self-pay | Admitting: Nurse Practitioner

## 2013-07-25 MED ORDER — DIAZEPAM 5 MG PO TABS: 5.0000 mg | ORAL_TABLET | Freq: Two times a day (BID) | ORAL | Status: DC | PRN

## 2013-07-25 MED ORDER — CYCLOBENZAPRINE HCL 10 MG PO TABS: 10.0000 mg | ORAL_TABLET | Freq: Three times a day (TID) | ORAL | Status: DC | PRN

## 2013-07-25 NOTE — Telephone Encounter (Signed)
Please call in valium rx - do not fill till 07/28/13

## 2013-07-25 NOTE — Telephone Encounter (Signed)
Called.

## 2013-07-26 ENCOUNTER — Other Ambulatory Visit: Payer: Self-pay | Admitting: Nurse Practitioner

## 2013-07-31 ENCOUNTER — Telehealth: Payer: Self-pay | Admitting: Nurse Practitioner

## 2013-07-31 NOTE — Telephone Encounter (Signed)
lmtcb

## 2013-07-31 NOTE — Telephone Encounter (Signed)
Patient is very upset waiting to hear from [place] about pain clinic referral needs pain meds she is out this mth until she can get in crying in pain

## 2013-08-01 ENCOUNTER — Other Ambulatory Visit: Payer: Self-pay | Admitting: Nurse Practitioner

## 2013-08-01 NOTE — Telephone Encounter (Signed)
It takes a while to get response from pain clinic

## 2013-08-01 NOTE — Telephone Encounter (Signed)
She has her records from the pain management

## 2013-08-01 NOTE — Telephone Encounter (Signed)
We need those to send to doctor

## 2013-08-01 NOTE — Telephone Encounter (Signed)
Patient aware that it will take a while for pain clinic and patient wants to know if you will give her something for her back pain

## 2013-08-01 NOTE — Telephone Encounter (Signed)
According  To notes we need records from previous pain management- patient was informed of this on 11/18 according to chart

## 2013-08-03 MED ORDER — HYDROCODONE-ACETAMINOPHEN 5-325 MG PO TABS: 1.0000 | ORAL_TABLET | Freq: Four times a day (QID) | ORAL | Status: DC | PRN

## 2013-08-03 MED ORDER — CYCLOBENZAPRINE HCL 10 MG PO TABS: 10.0000 mg | ORAL_TABLET | Freq: Three times a day (TID) | ORAL | Status: DC | PRN

## 2013-08-03 NOTE — Telephone Encounter (Signed)
Just filled 07/25/13 for #30 but pt takes tid

## 2013-08-03 NOTE — Telephone Encounter (Signed)
rx corrected and sent to pharmacy

## 2013-08-03 NOTE — Telephone Encounter (Signed)
Pin rx ready for pick up- no more- need records tot fax so can make appointment at pain clinic

## 2013-08-03 NOTE — Telephone Encounter (Signed)
Patient is wanting something for pain

## 2013-08-03 NOTE — Telephone Encounter (Signed)
Patient aware.

## 2013-08-06 ENCOUNTER — Encounter: Payer: Medicare Other | Admitting: Nurse Practitioner

## 2013-08-06 NOTE — Progress Notes (Signed)
   Subjective:    Patient ID: Bethany Villa, female    DOB: 1956-05-25, 57 y.o.   MRN: [id number]  HPI    Review of Systems     Objective:   Physical Exam        Assessment & Plan:  No show

## 2013-08-08 ENCOUNTER — Telehealth: Payer: Self-pay | Admitting: Nurse Practitioner

## 2013-08-09 NOTE — Telephone Encounter (Signed)
Once again we will not do pain meds especially percocet- need to get from pain clinic- have you brought old records so we can complete referral to pain  management

## 2013-08-10 NOTE — Telephone Encounter (Signed)
Patient aware and [facility] called her today about an appt

## 2013-08-14 ENCOUNTER — Telehealth: Payer: Self-pay | Admitting: Nurse Practitioner

## 2013-08-14 MED ORDER — BENZONATATE 100 MG PO CAPS: 100.0000 mg | ORAL_CAPSULE | Freq: Two times a day (BID) | ORAL | Status: DC | PRN

## 2013-08-14 NOTE — Telephone Encounter (Signed)
tessaolon perle rx sent to pharmacy

## 2013-08-14 NOTE — Telephone Encounter (Signed)
Detailed message left that rx sent to pharmacy. ?

## 2013-08-20 ENCOUNTER — Telehealth: Payer: Self-pay | Admitting: Nurse Practitioner

## 2013-08-20 DIAGNOSIS — G894 Chronic pain syndrome: Secondary | ICD-10-CM | POA: Diagnosis not present

## 2013-08-20 DIAGNOSIS — Z5181 Encounter for therapeutic drug level monitoring: Secondary | ICD-10-CM | POA: Diagnosis not present

## 2013-08-20 DIAGNOSIS — Z79899 Other long term (current) drug therapy: Secondary | ICD-10-CM | POA: Diagnosis not present

## 2013-08-20 MED ORDER — DIAZEPAM 5 MG PO TABS: 5.0000 mg | ORAL_TABLET | Freq: Two times a day (BID) | ORAL | Status: DC | PRN

## 2013-08-20 NOTE — Telephone Encounter (Signed)
Cannot take extra without discussing with provider- rx ready for pick up- cannot fill until 08/24/13

## 2013-08-21 ENCOUNTER — Telehealth: Payer: Self-pay | Admitting: Nurse Practitioner

## 2013-08-21 NOTE — Telephone Encounter (Signed)
Was done yesterdday

## 2013-08-22 ENCOUNTER — Ambulatory Visit: Payer: Self-pay | Admitting: Specialist

## 2013-08-22 DIAGNOSIS — J984 Other disorders of lung: Secondary | ICD-10-CM | POA: Diagnosis not present

## 2013-08-22 DIAGNOSIS — J449 Chronic obstructive pulmonary disease, unspecified: Secondary | ICD-10-CM | POA: Diagnosis not present

## 2013-08-22 DIAGNOSIS — M1710 Unilateral primary osteoarthritis, unspecified knee: Secondary | ICD-10-CM | POA: Diagnosis not present

## 2013-08-22 DIAGNOSIS — J45909 Unspecified asthma, uncomplicated: Secondary | ICD-10-CM | POA: Diagnosis not present

## 2013-08-22 DIAGNOSIS — R42 Dizziness and giddiness: Secondary | ICD-10-CM | POA: Diagnosis not present

## 2013-08-22 DIAGNOSIS — Z01812 Encounter for preprocedural laboratory examination: Secondary | ICD-10-CM | POA: Diagnosis not present

## 2013-08-22 DIAGNOSIS — F329 Major depressive disorder, single episode, unspecified: Secondary | ICD-10-CM | POA: Diagnosis not present

## 2013-08-22 DIAGNOSIS — Z9889 Other specified postprocedural states: Secondary | ICD-10-CM | POA: Diagnosis not present

## 2013-08-22 DIAGNOSIS — Z79899 Other long term (current) drug therapy: Secondary | ICD-10-CM | POA: Diagnosis not present

## 2013-08-22 DIAGNOSIS — Z01810 Encounter for preprocedural cardiovascular examination: Secondary | ICD-10-CM | POA: Diagnosis not present

## 2013-08-22 DIAGNOSIS — F411 Generalized anxiety disorder: Secondary | ICD-10-CM | POA: Diagnosis not present

## 2013-08-22 DIAGNOSIS — M79609 Pain in unspecified limb: Secondary | ICD-10-CM | POA: Diagnosis not present

## 2013-08-22 LAB — CBC
MCH: 26.6 pg (ref 26.0–34.0)
MCHC: 31.9 g/dL — ABNORMAL LOW (ref 32.0–36.0)
MCV: 83 fL (ref 80–100)
RBC: 5.44 10*6/uL — ABNORMAL HIGH (ref 3.80–5.20)

## 2013-08-22 LAB — BASIC METABOLIC PANEL
BUN: 7 mg/dL (ref 7–18)
Calcium, Total: 9 mg/dL (ref 8.5–10.1)
Chloride: 100 mmol/L (ref 98–107)
Co2: 33 mmol/L — ABNORMAL HIGH (ref 21–32)
Glucose: 99 mg/dL (ref 65–99)

## 2013-08-22 LAB — PROTIME-INR
INR: 0.9
Prothrombin Time: 12 secs (ref 11.5–14.7)

## 2013-08-22 LAB — URINALYSIS, COMPLETE
Blood: NEGATIVE
Glucose,UR: NEGATIVE mg/dL (ref 0–75)
Protein: NEGATIVE
RBC,UR: 1 /HPF (ref 0–5)
Specific Gravity: 1.016 (ref 1.003–1.030)
WBC UR: 3 /HPF (ref 0–5)

## 2013-08-22 LAB — APTT: Activated PTT: 29.5 secs (ref 23.6–35.9)

## 2013-08-22 LAB — MRSA PCR SCREENING

## 2013-08-29 ENCOUNTER — Other Ambulatory Visit: Payer: Self-pay | Admitting: Nurse Practitioner

## 2013-09-10 ENCOUNTER — Telehealth: Payer: Self-pay | Admitting: Nurse Practitioner

## 2013-09-10 NOTE — Telephone Encounter (Signed)
PAtient aware 

## 2013-09-10 NOTE — Telephone Encounter (Signed)
ntbs for surgical clearance

## 2013-09-10 NOTE — Telephone Encounter (Signed)
Fax copy of office visit to [facility]

## 2013-09-10 NOTE — Telephone Encounter (Signed)
Patient has already been seen her for surgical clearance on 11/25 for this surgery

## 2013-09-10 NOTE — Telephone Encounter (Signed)
Already addressed earlier- saw patient in November for clearance- send notes

## 2013-09-11 ENCOUNTER — Telehealth: Payer: Self-pay | Admitting: Nurse Practitioner

## 2013-09-11 NOTE — Telephone Encounter (Signed)
Detailed message left that we refaxed surgical clearance form and left a detailed message with patient that form was sent in and to let us know if they still do not get it

## 2013-09-11 NOTE — Telephone Encounter (Signed)
Spoke with patient she went in for surgery and  They said she needed to have surgical clearance again due to pulmonary issues.

## 2013-09-17 DIAGNOSIS — G894 Chronic pain syndrome: Secondary | ICD-10-CM | POA: Diagnosis not present

## 2013-09-18 ENCOUNTER — Ambulatory Visit (INDEPENDENT_AMBULATORY_CARE_PROVIDER_SITE_OTHER): Payer: Medicare Other | Admitting: Nurse Practitioner

## 2013-09-18 ENCOUNTER — Encounter: Payer: Self-pay | Admitting: Nurse Practitioner

## 2013-09-18 VITALS — BP 150/89 | HR 111 | Temp 97.1°F | Ht 64.0 in | Wt 221.0 lb

## 2013-09-18 DIAGNOSIS — R059 Cough, unspecified: Secondary | ICD-10-CM | POA: Diagnosis not present

## 2013-09-18 DIAGNOSIS — J441 Chronic obstructive pulmonary disease with (acute) exacerbation: Secondary | ICD-10-CM

## 2013-09-18 DIAGNOSIS — R05 Cough: Secondary | ICD-10-CM | POA: Diagnosis not present

## 2013-09-18 MED ORDER — LEVALBUTEROL HCL 1.25 MG/3ML IN NEBU
1.2500 mg | INHALATION_SOLUTION | Freq: Once | RESPIRATORY_TRACT | Status: AC
  Administered 2013-09-18: 1.25 mg via RESPIRATORY_TRACT

## 2013-09-18 MED ORDER — DIAZEPAM 5 MG PO TABS: 5.0000 mg | ORAL_TABLET | Freq: Three times a day (TID) | ORAL | Status: DC | PRN

## 2013-09-18 MED ORDER — FLUTICASONE FUROATE-VILANTEROL 100-25 MCG/INH IN AEPB: 1.0000 | INHALATION_SPRAY | Freq: Every day | RESPIRATORY_TRACT | Status: DC

## 2013-09-18 MED ORDER — ALBUTEROL SULFATE (2.5 MG/3ML) 0.083% IN NEBU: 2.5000 mg | INHALATION_SOLUTION | Freq: Four times a day (QID) | RESPIRATORY_TRACT | Status: DC | PRN

## 2013-09-18 MED ORDER — TIOTROPIUM BROMIDE MONOHYDRATE 18 MCG IN CAPS: 18.0000 ug | ORAL_CAPSULE | Freq: Every day | RESPIRATORY_TRACT | Status: DC

## 2013-09-18 MED ORDER — PREDNISONE 20 MG PO TABS: ORAL_TABLET | ORAL | Status: DC

## 2013-09-18 NOTE — Progress Notes (Signed)
Subjective:    Patient ID: Bethany Villa, female    DOB: 04/13/1956, 58 y.o.   MRN: [id number]  HPI Pt here today for medical clearance for total knee replacement.  Procedure cancelled on August 28, 2013 by anesthesia because of wheezing. Patient has history of severe COPD   Review of Systems  Constitutional: Negative.  Negative for fever and chills.  HENT: Negative.  Negative for congestion, ear pain, postnasal drip, sinus pressure and sore throat.   Eyes: Negative.   Respiratory: Positive for choking and wheezing. Negative for cough, chest tightness and shortness of breath.   Gastrointestinal: Negative for blood in stool.  Endocrine: Negative.   Genitourinary: Negative.   Musculoskeletal: Positive for arthralgias.  Skin: Negative for rash.  Neurological: Negative for weakness and headaches.  Hematological: Negative.   Psychiatric/Behavioral: Negative.   All other systems reviewed and are negative.       Objective:   Physical Exam  Constitutional: She is oriented to person, place, and time. She appears well-developed.  HENT:  Head: Normocephalic and atraumatic.  Right Ear: External ear normal.  Left Ear: External ear normal.  Nose: Nose normal.  Mouth/Throat: Oropharynx is clear and moist.  Eyes: Pupils are equal, round, and reactive to light.  Neck: Normal range of motion. Neck supple.  Cardiovascular: Normal rate, regular rhythm and normal heart sounds.   Pulmonary/Chest: Effort normal. No respiratory distress. She has wheezes. She has rhonchi.  Abdominal: Soft. Bowel sounds are normal.  Musculoskeletal: Normal range of motion. She exhibits no tenderness.  Neurological: She is alert and oriented to person, place, and time. She has normal reflexes.  Skin: Skin is warm and dry. No rash noted.  Psychiatric: She has a normal mood and affect. Her behavior is normal.   BP 150/89  Pulse 111  Temp(Src) 97.1 F (36.2 C) (Oral)  Ht 5\' 4"  (1.626 m)  Wt 221 lb (100.245  kg)  BMI 37.92 kg/m2  S/P nebulizer- scattered wheezes more on right then left with diminished breath sounds on left      Assessment & Plan:   1. Cough   2. COPD exacerbation    Meds ordered this encounter  Medications  . gabapentin (NEURONTIN) 300 MG capsule    Sig: Take 300 mg by mouth 3 (three) times daily.   Marland Kitchen. HYDROmorphone HCl 16 MG T24A    Sig: Take 16 mg by mouth daily.   . traMADol (ULTRAM) 50 MG tablet    Sig: Take 50 mg by mouth every 6 (six) hours as needed.  . levalbuterol (XOPENEX) nebulizer solution 1.25 mg    Sig:   . albuterol (PROVENTIL) (2.5 MG/3ML) 0.083% nebulizer solution    Sig: Take 3 mLs (2.5 mg total) by nebulization every 6 (six) hours as needed for wheezing or shortness of breath.    Dispense:  75 mL    Refill:  3    Order Specific Question:  Supervising Provider    Answer:  Ernestina PennaMOORE, DONALD W [[id number]]  . diazepam (VALIUM) 5 MG tablet    Sig: Take 1 tablet (5 mg total) by mouth every 8 (eight) hours as needed for anxiety.    Dispense:  90 tablet    Refill:  0    Do not fill until 08/24/13    Order Specific Question:  Supervising Provider    Answer:  Ernestina PennaMOORE, DONALD W [[id number]]  . Fluticasone Furoate-Vilanterol 100-25 MCG/INH AEPB    Sig: Inhale 1 puff into the lungs daily.  Dispense:  14 each    Refill:  0    Order Specific Question:  Supervising Provider    Answer:  Ernestina Penna [[id number]]  . tiotropium (SPIRIVA HANDIHALER) 18 MCG inhalation capsule    Sig: Place 1 capsule (18 mcg total) into inhaler and inhale daily.    Dispense:  30 capsule    Refill:  12    Order Specific Question:  Supervising Provider    Answer:  Ernestina Penna [[id number]]  . predniSONE (DELTASONE) 20 MG tablet    Sig: 2 po at sametime daily for 5 days    Dispense:  10 tablet    Refill:  0    Order Specific Question:  Supervising Provider    Answer:  Ernestina Penna [[id number]]  use O2 as rx at night Avoid being around sick people Good handwashing Rest  Force fluids RTO in 1  week recheck  Mary-Margaret Daphine Deutscher, FNP

## 2013-09-18 NOTE — Addendum Note (Signed)
Addended by: Bennie PieriniMARTIN, MARY-MARGARET on: 09/18/2013 10:10 AM   Modules accepted: Orders

## 2013-09-18 NOTE — Patient Instructions (Signed)
Chronic Obstructive Pulmonary Disease  Chronic obstructive pulmonary disease (COPD) is a common lung condition in which airflow from the lungs is limited. COPD is a general term that can be used to describe many different lung problems that limit airflow, including both chronic bronchitis and emphysema.  If you have COPD, your lung function will probably never return to normal, but there are measures you can take to improve lung function and make yourself feel better.   CAUSES   · Smoking (common).    · Exposure to secondhand smoke.    · Genetic problems.  · Chronic inflammatory lung diseases or recurrent infections.  SYMPTOMS   · Shortness of breath, especially with physical activity.    · Deep, persistent (chronic) cough with a large amount of thick mucus.    · Wheezing.    · Rapid breaths (tachypnea).    · Gray or bluish discoloration (cyanosis) of the skin, especially in fingers, toes, or lips.    · Fatigue.    · Weight loss.    · Frequent infections or episodes when breathing symptoms become much worse (exacerbations).    · Chest tightness.  DIAGNOSIS   Your healthcare provider will take a medical history and perform a physical examination to make the initial diagnosis.  Additional tests for COPD may include:   · Lung (pulmonary) function tests.  · Chest X-ray.  · CT scan.  · Blood tests.  TREATMENT   Treatment available to help you feel better when you have COPD include:   · Inhaler and nebulizer medicines. These help manage the symptoms of COPD and make your breathing more comfortable  · Supplemental oxygen. Supplemental oxygen is only helpful if you have a low oxygen level in your blood.    · Exercise and physical activity. These are beneficial for nearly all people with COPD. Some people may also benefit from a pulmonary rehabilitation program.  HOME CARE INSTRUCTIONS   · Take all medicines (inhaled or pills) as directed by your health care provider.  · Only take over-the-counter or prescription medicines  for pain, fever, or discomfort as directed by your health care provider.    · Avoid over-the-counter medicines or cough syrups that dry up your airway (such as antihistamines) and slow down the elimination of secretions unless instructed otherwise by your healthcare provider.    · If you are a smoker, the most important thing that you can do is stop smoking. Continuing to smoke will cause further lung damage and breathing trouble. Ask your health care provider for help with quitting smoking. He or she can direct you to community resources or hospitals that provide support.  · Avoid exposure to irritants such as smoke, chemicals, and fumes that aggravate your breathing.  · Use oxygen therapy and pulmonary rehabilitation if directed by your health care provider. If you require home oxygen therapy, ask your healthcare provider whether you should purchase a pulse oximeter to measure your oxygen level at home.    · Avoid contact with individuals who have a contagious illness.  · Avoid extreme temperature and humidity changes.  · Eat healthy foods. Eating smaller, more frequent meals and resting before meals may help you maintain your strength.  · Stay active, but balance activity with periods of rest. Exercise and physical activity will help you maintain your ability to do things you want to do.  · Preventing infection and hospitalization is very important when you have COPD. Make sure to receive all the vaccines your health care provider recommends, especially the pneumococcal and influenza vaccines. Ask your healthcare provider whether you   need a pneumonia vaccine.  · Learn and use relaxation techniques to manage stress.  · Learn and use controlled breathing techniques as directed by your health care provider. Controlled breathing techniques include:    · Pursed lip breathing. Start by breathing in (inhaling) through your nose for 1 second. Then, purse your lips as if you were going to whistle and breathe out (exhale)  through the pursed lips for 2 seconds.    · Diaphragmatic breathing. Start by putting one hand on your abdomen just above your waist. Inhale slowly through your nose. The hand on your abdomen should move out. Then purse your lips and exhale slowly. You should be able to feel the hand on your abdomen moving in as you exhale.    · Learn and use controlled coughing to clear mucus from your lungs. Controlled coughing is a series of short, progressive coughs. The steps of controlled coughing are:    1. Lean your head slightly forward.    2. Breathe in deeply using diaphragmatic breathing.    3. Try to hold your breath for 3 seconds.    4. Keep your mouth slightly open while coughing twice.    5. Spit any mucus out into a tissue.    6. Rest and repeat the steps once or twice as needed.  SEEK MEDICAL CARE IF:   · You are coughing up more mucus than usual.    · There is a change in the color or thickness of your mucus.    · Your breathing is more labored than usual.    · Your breathing is faster than usual.    SEEK IMMEDIATE MEDICAL CARE IF:   · You have shortness of breath while you are resting.    · You have shortness of breath that prevents you from:  · Being able to talk.    · Performing your usual physical activities.    · You have chest pain lasting longer than 5 minutes.    · Your skin color is more cyanotic than usual.  · You measure low oxygen saturations for longer than 5 minutes with a pulse oximeter.  MAKE SURE YOU:   · Understand these instructions.  · Will watch your condition.  · Will get help right away if you are not doing well or get worse.  Document Released: 05/26/2005 Document Revised: 06/06/2013 Document Reviewed: 04/12/2013  [employer]® Patient Information ©2014 [employer].

## 2013-09-19 ENCOUNTER — Telehealth: Payer: Self-pay | Admitting: Nurse Practitioner

## 2013-09-19 NOTE — Telephone Encounter (Signed)
Pt said she needs more Valium prescribed than one q 8 hours. Wants it say take as needed.

## 2013-09-19 NOTE — Telephone Encounter (Signed)
i TID is the highest we will go on valium- cannot right to take as needed

## 2013-09-24 ENCOUNTER — Other Ambulatory Visit: Payer: Self-pay | Admitting: *Deleted

## 2013-09-24 MED ORDER — ALBUTEROL SULFATE (2.5 MG/3ML) 0.083% IN NEBU: 2.5000 mg | INHALATION_SOLUTION | Freq: Four times a day (QID) | RESPIRATORY_TRACT | Status: DC | PRN

## 2013-09-25 ENCOUNTER — Ambulatory Visit: Payer: Medicare Other | Admitting: Nurse Practitioner

## 2013-09-26 ENCOUNTER — Telehealth: Payer: Self-pay | Admitting: Nurse Practitioner

## 2013-09-27 ENCOUNTER — Telehealth: Payer: Self-pay | Admitting: Nurse Practitioner

## 2013-09-27 ENCOUNTER — Other Ambulatory Visit: Payer: Self-pay | Admitting: Nurse Practitioner

## 2013-09-27 ENCOUNTER — Other Ambulatory Visit: Payer: Self-pay | Admitting: *Deleted

## 2013-09-27 MED ORDER — HYDROCODONE-HOMATROPINE 5-1.5 MG/5ML PO SYRP: 5.0000 mL | ORAL_SOLUTION | Freq: Three times a day (TID) | ORAL | Status: DC | PRN

## 2013-09-27 MED ORDER — ALBUTEROL SULFATE HFA 108 (90 BASE) MCG/ACT IN AERS: 2.0000 | INHALATION_SPRAY | Freq: Four times a day (QID) | RESPIRATORY_TRACT | Status: DC | PRN

## 2013-09-27 MED ORDER — BENZONATATE 100 MG PO CAPS: 100.0000 mg | ORAL_CAPSULE | Freq: Two times a day (BID) | ORAL | Status: DC | PRN

## 2013-09-27 NOTE — Telephone Encounter (Signed)
Pt notified rx sent to pharmacy

## 2013-09-27 NOTE — Telephone Encounter (Signed)
Please review

## 2013-09-27 NOTE — Telephone Encounter (Signed)
Sent in rx for proair- should cover that

## 2013-09-27 NOTE — Telephone Encounter (Signed)
Tessalon perles rx sent to pharmacy 

## 2013-09-29 ENCOUNTER — Encounter (HOSPITAL_COMMUNITY): Payer: Self-pay | Admitting: Emergency Medicine

## 2013-09-29 ENCOUNTER — Other Ambulatory Visit: Payer: Self-pay

## 2013-09-29 ENCOUNTER — Inpatient Hospital Stay (HOSPITAL_COMMUNITY)
Admission: EM | Admit: 2013-09-29 | Discharge: 2013-10-03 | DRG: 190 | Disposition: A | Discharge status: Home / Self Care | Payer: Medicare Other | Attending: Family Medicine | Admitting: Family Medicine

## 2013-09-29 ENCOUNTER — Emergency Department (HOSPITAL_COMMUNITY): Payer: Medicare Other

## 2013-09-29 DIAGNOSIS — M1990 Unspecified osteoarthritis, unspecified site: Secondary | ICD-10-CM | POA: Diagnosis present

## 2013-09-29 DIAGNOSIS — F329 Major depressive disorder, single episode, unspecified: Secondary | ICD-10-CM | POA: Diagnosis present

## 2013-09-29 DIAGNOSIS — F17200 Nicotine dependence, unspecified, uncomplicated: Secondary | ICD-10-CM | POA: Diagnosis present

## 2013-09-29 DIAGNOSIS — F411 Generalized anxiety disorder: Secondary | ICD-10-CM | POA: Diagnosis present

## 2013-09-29 DIAGNOSIS — J45901 Unspecified asthma with (acute) exacerbation: Principal | ICD-10-CM

## 2013-09-29 DIAGNOSIS — Z79899 Other long term (current) drug therapy: Secondary | ICD-10-CM | POA: Diagnosis not present

## 2013-09-29 DIAGNOSIS — R0602 Shortness of breath: Secondary | ICD-10-CM | POA: Diagnosis not present

## 2013-09-29 DIAGNOSIS — Z9981 Dependence on supplemental oxygen: Secondary | ICD-10-CM

## 2013-09-29 DIAGNOSIS — J9622 Acute and chronic respiratory failure with hypercapnia: Secondary | ICD-10-CM | POA: Diagnosis present

## 2013-09-29 DIAGNOSIS — Z6829 Body mass index (BMI) 29.0-29.9, adult: Secondary | ICD-10-CM

## 2013-09-29 DIAGNOSIS — J9602 Acute respiratory failure with hypercapnia: Secondary | ICD-10-CM | POA: Diagnosis present

## 2013-09-29 DIAGNOSIS — J9600 Acute respiratory failure, unspecified whether with hypoxia or hypercapnia: Secondary | ICD-10-CM

## 2013-09-29 DIAGNOSIS — E785 Hyperlipidemia, unspecified: Secondary | ICD-10-CM | POA: Diagnosis present

## 2013-09-29 DIAGNOSIS — G9341 Metabolic encephalopathy: Secondary | ICD-10-CM | POA: Diagnosis present

## 2013-09-29 DIAGNOSIS — R4182 Altered mental status, unspecified: Secondary | ICD-10-CM | POA: Diagnosis not present

## 2013-09-29 DIAGNOSIS — J441 Chronic obstructive pulmonary disease with (acute) exacerbation: Secondary | ICD-10-CM | POA: Diagnosis not present

## 2013-09-29 DIAGNOSIS — J9620 Acute and chronic respiratory failure, unspecified whether with hypoxia or hypercapnia: Secondary | ICD-10-CM | POA: Diagnosis present

## 2013-09-29 DIAGNOSIS — IMO0001 Reserved for inherently not codable concepts without codable children: Secondary | ICD-10-CM | POA: Diagnosis present

## 2013-09-29 DIAGNOSIS — E872 Acidosis, unspecified: Secondary | ICD-10-CM | POA: Diagnosis not present

## 2013-09-29 DIAGNOSIS — I498 Other specified cardiac arrhythmias: Secondary | ICD-10-CM | POA: Diagnosis not present

## 2013-09-29 DIAGNOSIS — K219 Gastro-esophageal reflux disease without esophagitis: Secondary | ICD-10-CM | POA: Diagnosis present

## 2013-09-29 DIAGNOSIS — F410 Panic disorder [episodic paroxysmal anxiety] without agoraphobia: Secondary | ICD-10-CM | POA: Diagnosis present

## 2013-09-29 DIAGNOSIS — F3289 Other specified depressive episodes: Secondary | ICD-10-CM | POA: Diagnosis present

## 2013-09-29 DIAGNOSIS — G894 Chronic pain syndrome: Secondary | ICD-10-CM | POA: Diagnosis not present

## 2013-09-29 DIAGNOSIS — E669 Obesity, unspecified: Secondary | ICD-10-CM | POA: Diagnosis not present

## 2013-09-29 DIAGNOSIS — J449 Chronic obstructive pulmonary disease, unspecified: Secondary | ICD-10-CM | POA: Diagnosis not present

## 2013-09-29 HISTORY — DX: Dependence on supplemental oxygen: Z99.81

## 2013-09-29 LAB — BASIC METABOLIC PANEL
BUN: 8 mg/dL (ref 6–23)
CALCIUM: 9.2 mg/dL (ref 8.4–10.5)
CO2: 39 mEq/L — ABNORMAL HIGH (ref 19–32)
CREATININE: 0.55 mg/dL (ref 0.50–1.10)
Chloride: 95 mEq/L — ABNORMAL LOW (ref 96–112)
GFR calc Af Amer: >90 mL/min (ref >90)
GLUCOSE: 129 mg/dL — AB (ref 70–99)
POTASSIUM: 4 meq/L (ref 3.7–5.3)
Sodium: 141 mEq/L (ref 137–147)

## 2013-09-29 LAB — CBC WITH DIFFERENTIAL/PLATELET
Basophils Absolute: 0 10*3/uL (ref 0.0–0.1)
Basophils Relative: 0 % (ref 0–1)
EOS ABS: 0.3 10*3/uL (ref 0.0–0.7)
EOS PCT: 4 % (ref 0–5)
HCT: 45.7 % (ref 36.0–46.0)
HEMOGLOBIN: 13.8 g/dL (ref 12.0–15.0)
Lymphocytes Relative: 27 % (ref 12–46)
Lymphs Abs: 2.4 10*3/uL (ref 0.7–4.0)
MCH: 27.9 pg (ref 26.0–34.0)
MCHC: 30.2 g/dL (ref 30.0–36.0)
MCV: 92.3 fL (ref 78.0–100.0)
MONOS PCT: 12 % (ref 3–12)
Monocytes Absolute: 1 10*3/uL (ref 0.1–1.0)
Neutro Abs: 5.2 10*3/uL (ref 1.7–7.7)
Neutrophils Relative %: 57 % (ref 43–77)
Platelets: 198 10*3/uL (ref 150–400)
RBC: 4.95 MIL/uL (ref 3.87–5.11)
RDW: 18.3 % — ABNORMAL HIGH (ref 11.5–15.5)
WBC: 9 10*3/uL (ref 4.0–10.5)

## 2013-09-29 LAB — BLOOD GAS, ARTERIAL
Acid-Base Excess: 10.3 mmol/L — ABNORMAL HIGH (ref 0.0–2.0)
BICARBONATE: 37.4 meq/L — AB (ref 20.0–24.0)
Drawn by: 105551
O2 Content: 4 L/min
O2 SAT: 92 %
PO2 ART: 72.7 mmHg — AB (ref 80.0–100.0)
Patient temperature: 37
TCO2: 34.4 mmol/L (ref 0–100)
pCO2 arterial: 86.9 mmHg (ref 35.0–45.0)
pH, Arterial: 7.257 — ABNORMAL LOW (ref 7.350–7.450)

## 2013-09-29 LAB — TROPONIN I: Troponin I: <0.3 ng/mL (ref <0.30)

## 2013-09-29 LAB — GLUCOSE, CAPILLARY: GLUCOSE-CAPILLARY: 96 mg/dL (ref 70–99)

## 2013-09-29 LAB — INFLUENZA PANEL BY PCR (TYPE A & B)
H1N1FLUPCR: NOT DETECTED
Influenza A By PCR: NEGATIVE
Influenza B By PCR: NEGATIVE

## 2013-09-29 LAB — MRSA PCR SCREENING: MRSA by PCR: NEGATIVE

## 2013-09-29 MED ORDER — ACETAMINOPHEN 650 MG RE SUPP: 650.0000 mg | Freq: Four times a day (QID) | RECTAL | Status: DC | PRN

## 2013-09-29 MED ORDER — ONDANSETRON HCL 4 MG/2ML IJ SOLN: 4.0000 mg | Freq: Four times a day (QID) | INTRAMUSCULAR | Status: DC | PRN

## 2013-09-29 MED ORDER — TIOTROPIUM BROMIDE MONOHYDRATE 18 MCG IN CAPS
18.0000 ug | ORAL_CAPSULE | Freq: Every day | RESPIRATORY_TRACT | Status: DC
  Administered 2013-09-30 – 2013-10-03 (×4): 18 ug via RESPIRATORY_TRACT
  Filled 2013-09-29: qty 5

## 2013-09-29 MED ORDER — CYCLOBENZAPRINE HCL 10 MG PO TABS
10.0000 mg | ORAL_TABLET | Freq: Three times a day (TID) | ORAL | Status: DC | PRN
  Administered 2013-09-30 – 2013-10-03 (×4): 10 mg via ORAL
  Filled 2013-09-29 (×4): qty 1

## 2013-09-29 MED ORDER — ALBUTEROL (5 MG/ML) CONTINUOUS INHALATION SOLN
10.0000 mg/h | INHALATION_SOLUTION | Freq: Once | RESPIRATORY_TRACT | Status: AC
  Administered 2013-09-29: 10 mg/h via RESPIRATORY_TRACT
  Filled 2013-09-29: qty 20

## 2013-09-29 MED ORDER — ALBUTEROL (5 MG/ML) CONTINUOUS INHALATION SOLN: 5.0000 mg/h | INHALATION_SOLUTION | Freq: Once | RESPIRATORY_TRACT | Status: DC

## 2013-09-29 MED ORDER — METHYLPREDNISOLONE SODIUM SUCC 125 MG IJ SOLR
60.0000 mg | Freq: Four times a day (QID) | INTRAMUSCULAR | Status: DC
  Administered 2013-09-29 – 2013-10-01 (×7): 60 mg via INTRAVENOUS
  Filled 2013-09-29 (×7): qty 2

## 2013-09-29 MED ORDER — ONDANSETRON HCL 4 MG PO TABS: 4.0000 mg | ORAL_TABLET | Freq: Four times a day (QID) | ORAL | Status: DC | PRN

## 2013-09-29 MED ORDER — LEVOFLOXACIN IN D5W 500 MG/100ML IV SOLN
500.0000 mg | INTRAVENOUS | Status: DC
  Administered 2013-09-29 – 2013-10-01 (×3): 500 mg via INTRAVENOUS
  Filled 2013-09-29 (×3): qty 100

## 2013-09-29 MED ORDER — OLOPATADINE HCL 0.1 % OP SOLN
1.0000 [drp] | Freq: Two times a day (BID) | OPHTHALMIC | Status: DC
  Administered 2013-09-30 – 2013-10-03 (×7): 1 [drp] via OPHTHALMIC
  Filled 2013-09-29 (×2): qty 5

## 2013-09-29 MED ORDER — GUAIFENESIN ER 600 MG PO TB12
600.0000 mg | ORAL_TABLET | Freq: Two times a day (BID) | ORAL | Status: DC
  Administered 2013-09-29 – 2013-10-03 (×8): 600 mg via ORAL
  Filled 2013-09-29 (×8): qty 1

## 2013-09-29 MED ORDER — DOCUSATE SODIUM 100 MG PO CAPS
100.0000 mg | ORAL_CAPSULE | Freq: Two times a day (BID) | ORAL | Status: DC
  Administered 2013-09-29 – 2013-10-03 (×8): 100 mg via ORAL
  Filled 2013-09-29 (×8): qty 1

## 2013-09-29 MED ORDER — FUROSEMIDE 40 MG PO TABS
40.0000 mg | ORAL_TABLET | Freq: Every day | ORAL | Status: DC
  Administered 2013-09-30 – 2013-10-03 (×4): 40 mg via ORAL
  Filled 2013-09-29 (×4): qty 1

## 2013-09-29 MED ORDER — FLUTICASONE FUROATE-VILANTEROL 100-25 MCG/INH IN AEPB: 1.0000 | INHALATION_SPRAY | Freq: Every day | RESPIRATORY_TRACT | Status: DC

## 2013-09-29 MED ORDER — ENOXAPARIN SODIUM 40 MG/0.4ML ~~LOC~~ SOLN
40.0000 mg | SUBCUTANEOUS | Status: DC
  Administered 2013-09-29 – 2013-10-02 (×4): 40 mg via SUBCUTANEOUS
  Filled 2013-09-29 (×4): qty 0.4

## 2013-09-29 MED ORDER — NICOTINE 14 MG/24HR TD PT24
14.0000 mg | MEDICATED_PATCH | Freq: Every day | TRANSDERMAL | Status: DC
  Administered 2013-09-30 – 2013-10-03 (×4): 14 mg via TRANSDERMAL
  Filled 2013-09-29 (×5): qty 1

## 2013-09-29 MED ORDER — INSULIN ASPART 100 UNIT/ML ~~LOC~~ SOLN
0.0000 [IU] | Freq: Three times a day (TID) | SUBCUTANEOUS | Status: DC
  Administered 2013-09-30: 5 [IU] via SUBCUTANEOUS
  Administered 2013-09-30 (×2): 2 [IU] via SUBCUTANEOUS
  Administered 2013-10-01: 3 [IU] via SUBCUTANEOUS
  Administered 2013-10-01: 2 [IU] via SUBCUTANEOUS

## 2013-09-29 MED ORDER — DIAZEPAM 5 MG PO TABS
5.0000 mg | ORAL_TABLET | Freq: Three times a day (TID) | ORAL | Status: DC | PRN
  Administered 2013-10-02: 5 mg via ORAL
  Filled 2013-09-29: qty 1

## 2013-09-29 MED ORDER — LEVOFLOXACIN IN D5W 500 MG/100ML IV SOLN
INTRAVENOUS | Status: AC
  Filled 2013-09-29: qty 100

## 2013-09-29 MED ORDER — ATORVASTATIN CALCIUM 20 MG PO TABS
20.0000 mg | ORAL_TABLET | Freq: Every day | ORAL | Status: DC
  Administered 2013-09-30 – 2013-10-03 (×4): 20 mg via ORAL
  Filled 2013-09-29 (×4): qty 1

## 2013-09-29 MED ORDER — SODIUM CHLORIDE 0.9 % IV SOLN
INTRAVENOUS | Status: DC
  Administered 2013-09-29: 1000 mL via INTRAVENOUS

## 2013-09-29 MED ORDER — ALBUTEROL SULFATE (2.5 MG/3ML) 0.083% IN NEBU: 2.5000 mg | INHALATION_SOLUTION | RESPIRATORY_TRACT | Status: DC | PRN

## 2013-09-29 MED ORDER — HYDROMORPHONE HCL ER 16 MG PO T24A: 16.0000 mg | EXTENDED_RELEASE_TABLET | Freq: Every day | ORAL | Status: DC

## 2013-09-29 MED ORDER — BUPROPION HCL ER (XL) 150 MG PO TB24
150.0000 mg | ORAL_TABLET | Freq: Every day | ORAL | Status: DC
  Administered 2013-09-30 – 2013-10-03 (×4): 150 mg via ORAL
  Filled 2013-09-29 (×7): qty 1

## 2013-09-29 MED ORDER — PANTOPRAZOLE SODIUM 40 MG PO TBEC
40.0000 mg | DELAYED_RELEASE_TABLET | Freq: Every day | ORAL | Status: DC
  Administered 2013-09-30 – 2013-10-03 (×4): 40 mg via ORAL
  Filled 2013-09-29 (×4): qty 1

## 2013-09-29 MED ORDER — ALBUTEROL SULFATE (2.5 MG/3ML) 0.083% IN NEBU
2.5000 mg | INHALATION_SOLUTION | RESPIRATORY_TRACT | Status: DC
  Administered 2013-09-30 – 2013-10-02 (×15): 2.5 mg via RESPIRATORY_TRACT
  Filled 2013-09-29 (×15): qty 3

## 2013-09-29 MED ORDER — SODIUM CHLORIDE 0.9 % IV SOLN
INTRAVENOUS | Status: AC
  Administered 2013-09-29 – 2013-09-30 (×2): via INTRAVENOUS

## 2013-09-29 MED ORDER — HYDROCODONE-ACETAMINOPHEN 5-325 MG PO TABS
1.0000 | ORAL_TABLET | ORAL | Status: DC | PRN
  Administered 2013-09-29: 1 via ORAL
  Administered 2013-09-30 – 2013-10-03 (×12): 2 via ORAL
  Filled 2013-09-29 (×12): qty 2

## 2013-09-29 MED ORDER — ACETAMINOPHEN 325 MG PO TABS: 650.0000 mg | ORAL_TABLET | Freq: Four times a day (QID) | ORAL | Status: DC | PRN

## 2013-09-29 MED ORDER — GUAIFENESIN-DM 100-10 MG/5ML PO SYRP
5.0000 mL | ORAL_SOLUTION | ORAL | Status: DC | PRN
  Administered 2013-10-01 – 2013-10-02 (×3): 5 mL via ORAL
  Filled 2013-09-29 (×3): qty 5

## 2013-09-29 MED ORDER — FENTANYL CITRATE 0.05 MG/ML IJ SOLN
50.0000 ug | INTRAMUSCULAR | Status: AC | PRN
  Administered 2013-09-29 (×2): 50 ug via INTRAVENOUS
  Filled 2013-09-29 (×2): qty 2

## 2013-09-29 MED ORDER — GABAPENTIN 300 MG PO CAPS
300.0000 mg | ORAL_CAPSULE | Freq: Three times a day (TID) | ORAL | Status: DC
  Administered 2013-09-30 – 2013-10-03 (×11): 300 mg via ORAL
  Filled 2013-09-29 (×8): qty 1
  Filled 2013-09-29: qty 3
  Filled 2013-09-29 (×2): qty 1

## 2013-09-29 MED ORDER — IPRATROPIUM BROMIDE 0.02 % IN SOLN
1.0000 mg | Freq: Once | RESPIRATORY_TRACT | Status: AC
  Administered 2013-09-29: 1 mg via RESPIRATORY_TRACT
  Filled 2013-09-29: qty 5

## 2013-09-29 NOTE — ED Notes (Signed)
Spoke with Dr. Clarene DukeMcManus about walking pt. Pt lethargic and wheezing. Per EDP's verbal orders will not be ambulating pt at present.

## 2013-09-29 NOTE — ED Notes (Addendum)
Pt c/o sob breath with productive cough-green sputum. Pt also reports fever at night. Pt given 3 albuterol tx and 125mg  iv solumedrol in route.

## 2013-09-29 NOTE — ED Provider Notes (Signed)
CSN: [id number]     Arrival date & time 09/29/13  1536 History   First MD Initiated Contact with Patient 09/29/13 1539     Chief Complaint  Patient presents with  . Shortness of Breath    HPI Pt was seen at 1610.  Per pt, c/o gradual onset and worsening of persistent cough, wheezing and SOB for the past 2 to 3 weeks.  Describes her symptoms as "my COPD is acting up."  Has been using home O2 N/C, MDI and nebs without relief. States she has been evaluated by her PMD for same on 09/18/13, rx course of prednisone. States she took all her meds and continues to have worsening symptoms. Pt has significant hx of "severe COPD." Denies CP/palpitations, no back pain, no abd pain, no N/V/D, no fevers, no rash.    Past Medical History  Diagnosis Date  . Fibromyalgia   . Asthma   . DJD (degenerative joint disease)   . Chronic pain   . Depression   . Anxiety   . Panic attack   . Sleep disorder   . DJD (degenerative joint disease)   . GERD (gastroesophageal reflux disease)   . Hyperlipidemia   . COPD (chronic obstructive pulmonary disease)   . On home oxygen therapy    Past Surgical History  Procedure Laterality Date  . Abdominal surgery    . Back surgery    . Carpal tunnel release    . Ankle surgery    . Knee surgery    . Cesarean section    . Tonsillectomy    . Tubal ligation     Family History  Problem Relation Age of Onset  . Cancer Mother     beast   History  Substance Use Topics  . Smoking status: Current Every Day Smoker -- 1.00 packs/day for 8 years    Types: Cigarettes  . Smokeless tobacco: Never Used  . Alcohol Use: No     Comment: occ   OB History   Grav Para Term Preterm Abortions TAB SAB Ect Mult Living   3 2 2  1  1   2      Review of Systems ROS: Statement: All systems negative except as marked or noted in the HPI; Constitutional: Negative for fever and chills. ; ; Eyes: Negative for eye pain, redness and discharge. ; ; ENMT: Negative for ear pain, hoarseness,  nasal congestion, sinus pressure and sore throat. ; ; Cardiovascular: Negative for chest pain, palpitations, diaphoresis, and peripheral edema. ; ; Respiratory: +cough, wheezing, SOB. Negative for stridor. ; ; Gastrointestinal: Negative for nausea, vomiting, diarrhea, abdominal pain, blood in stool, hematemesis, jaundice and rectal bleeding. . ; ; Genitourinary: Negative for dysuria, flank pain and hematuria. ; ; Musculoskeletal: Negative for back pain and neck pain. Negative for swelling and trauma.; ; Skin: Negative for pruritus, rash, abrasions, blisters, bruising and skin lesion.; ; Neuro: Negative for headache, lightheadedness and neck stiffness. Negative for weakness, altered level of consciousness , altered mental status, extremity weakness, paresthesias, involuntary movement, seizure and syncope.       Allergies  Mobic; Penicillins; and Sulfa antibiotics  Home Medications   Current Outpatient Rx  Name  Route  Sig  Dispense  Refill  . atorvastatin (LIPITOR) 20 MG tablet   Oral   Take 1 tablet (20 mg total) by mouth daily.   90 tablet   0   . buPROPion (WELLBUTRIN XL) 150 MG 24 hr tablet   Oral   Take  150 mg by mouth daily.          . cyclobenzaprine (FLEXERIL) 10 MG tablet   Oral   Take 1 tablet (10 mg total) by mouth 3 (three) times daily as needed for muscle spasms.   90 tablet   1   . diazepam (VALIUM) 5 MG tablet   Oral   Take 1 tablet (5 mg total) by mouth every 8 (eight) hours as needed for anxiety.   90 tablet   0   . fluticasone (FLONASE) 50 MCG/ACT nasal spray   Nasal   Place 2 sprays into the nose daily.   48 g   1   . furosemide (LASIX) 40 MG tablet   Oral   Take 1 tablet (40 mg total) by mouth daily.   90 tablet   0   . gabapentin (NEURONTIN) 300 MG capsule   Oral   Take 300 mg by mouth 3 (three) times daily.          Marland Kitchen HYDROmorphone HCl 16 MG T24A   Oral   Take 16 mg by mouth daily.          Marland Kitchen levalbuterol (XOPENEX HFA) 45 MCG/ACT  inhaler   Inhalation   Inhale 2 puffs into the lungs every 4 (four) hours as needed for wheezing or shortness of breath.         . montelukast (SINGULAIR) 10 MG tablet   Oral   Take 1 tablet (10 mg total) by mouth at bedtime.   90 tablet   1   . olopatadine (PATANOL) 0.1 % ophthalmic solution   Both Eyes   Place 1 drop into both eyes 2 (two) times daily.   15 mL   0   . omeprazole (PRILOSEC) 20 MG capsule   Oral   Take 1 capsule (20 mg total) by mouth daily.   90 capsule   1   . tiotropium (SPIRIVA HANDIHALER) 18 MCG inhalation capsule   Inhalation   Place 1 capsule (18 mcg total) into inhaler and inhale daily.   30 capsule   12   . traMADol (ULTRAM) 50 MG tablet   Oral   Take 50 mg by mouth every 6 (six) hours as needed. pain         . albuterol (PROAIR HFA) 108 (90 BASE) MCG/ACT inhaler   Inhalation   Inhale 2 puffs into the lungs every 6 (six) hours as needed for wheezing or shortness of breath.   8.5 g   2   . albuterol (PROVENTIL) (2.5 MG/3ML) 0.083% nebulizer solution   Nebulization   Take 3 mLs (2.5 mg total) by nebulization every 6 (six) hours as needed for wheezing or shortness of breath.   75 mL   3     Dx 496   . Fluticasone Furoate-Vilanterol 100-25 MCG/INH AEPB   Inhalation   Inhale 1 puff into the lungs daily.   14 each   0   . HYDROcodone-homatropine (HYCODAN) 5-1.5 MG/5ML syrup   Oral   Take 5 mLs by mouth every 8 (eight) hours as needed for cough.   120 mL   0   . ibuprofen (ADVIL,MOTRIN) 800 MG tablet      TAKE 1 TABLET (800 MG TOTAL) BY MOUTH EVERY 6 (SIX) HOURS AS NEEDED FOR PAIN.   90 tablet   0    BP 128/80  Pulse 95  Temp(Src) 98.8 F (37.1 C)  Resp 25  Ht 5\' 4"  (1.626 m)  Wt 170 lb (77.111 kg)  BMI 29.17 kg/m2  SpO2 97% Physical Exam 1615: Physical examination:  Nursing notes reviewed; Vital signs and O2 SAT reviewed;  Constitutional: Well developed, Well nourished, Uncomfortable appearing.; Head:  Normocephalic,  atraumatic; Eyes: EOMI, PERRL, No scleral icterus; ENMT: Mouth and pharynx normal, Mucous membranes dry; Neck: Supple, Full range of motion, No lymphadenopathy; Cardiovascular: Tachycardic rate and rhythm, No gallop; Respiratory: Breath sounds coarse & equal bilaterally, insp/exp wheezes bilat, +audible wheezing. Speaking short sentences, tachypneic, sitting upright.; Chest: Nontender, Movement normal; Abdomen: Soft, Nontender, Nondistended, Normal bowel sounds; Genitourinary: No CVA tenderness; Extremities: Pulses normal, No tenderness, No edema, No calf edema or asymmetry.; Neuro: AA&Ox3, Major CN grossly intact.  Speech clear. No gross focal motor or sensory deficits in extremities.; Skin: Color normal, Warm, Dry.   ED Course  Procedures   1620:  On arrival, pt with audible wheezing, tachypneic, speaking short sentences. Known hx of "severe COPD," on home O2.  Will dose IV solumedrol and hour long neb.  1945:  Neb completed. Pt slept for short period of time after she requested pain meds for her chronic pain, Sats 97% on O2 4L N/C. Pt woke to her name, and started to move on stretcher to attempt to ambulate and began to have audible wheezes again, lungs with insp/exp wheezing. Dx and testing d/w pt.  Questions answered.  Verb understanding, agreeable to admit.   2010:  T/C to [facility]. Rito Ehrlich, case discussed, including:  HPI, pertinent PM/SHx, VS/PE, dx testing, ED course and treatment:  Agreeable to admit, requests to write temporary orders, obtain medical inpatient bed to team 1.    EKG Interpretation    Date/Time:  Saturday September 29 2013 15:35:33 EST Ventricular Rate:  106 PR Interval:  128 QRS Duration: 92 QT Interval:  352 QTC Calculation: 467 R Axis:   -10 Text Interpretation:  Sinus tachycardia Baseline wander Low voltage QRS Possible Inferior infarct , age undetermined Cannot rule out Anterior infarct , age undetermined Abnormal ECG When compared with ECG of 10/03/2011 No  significant change was found Confirmed by [facility]  MD, Nicholos Johns [phone]) on 09/29/2013 5:04:05 PM            MDM  MDM Reviewed: previous chart, nursing note and vitals Reviewed previous: labs and ECG Interpretation: labs, ECG and x-ray Total time providing critical care: 30-74 minutes. This excludes time spent performing separately reportable procedures and services. Consults: admitting MD      CRITICAL CARE Performed by: Laray Anger Total critical care time: 35 Critical care time was exclusive of separately billable procedures and treating other patients. Critical care was necessary to treat or prevent imminent or life-threatening deterioration. Critical care was time spent personally by me on the following activities: development of treatment plan with patient and/or surrogate as well as nursing, discussions with consultants, evaluation of patient's response to treatment, examination of patient, obtaining history from patient or surrogate, ordering and performing treatments and interventions, ordering and review of laboratory studies, ordering and review of radiographic studies, pulse oximetry and re-evaluation of patient's condition.   Results for orders placed during the hospital encounter of 09/29/13  CBC WITH DIFFERENTIAL      Result Value Range   WBC 9.0  4.0 - 10.5 K/uL   RBC 4.95  3.87 - 5.11 MIL/uL   Hemoglobin 13.8  12.0 - 15.0 g/dL   HCT 96.0  45.4 - 09.8 %   MCV 92.3  78.0 - 100.0 fL   MCH 27.9  26.0 - 34.0 pg   MCHC 30.2  30.0 - 36.0 g/dL   RDW 09.8 (*) 11.9 - 14.7 %   Platelets 198  150 - 400 K/uL   Neutrophils Relative % 57  43 - 77 %   Neutro Abs 5.2  1.7 - 7.7 K/uL   Lymphocytes Relative 27  12 - 46 %   Lymphs Abs 2.4  0.7 - 4.0 K/uL   Monocytes Relative 12  3 - 12 %   Monocytes Absolute 1.0  0.1 - 1.0 K/uL   Eosinophils Relative 4  0 - 5 %   Eosinophils Absolute 0.3  0.0 - 0.7 K/uL   Basophils Relative 0  0 - 1 %   Basophils Absolute 0.0  0.0  - 0.1 K/uL  BASIC METABOLIC PANEL      Result Value Range   Sodium 141  137 - 147 mEq/L   Potassium 4.0  3.7 - 5.3 mEq/L   Chloride 95 (*) 96 - 112 mEq/L   CO2 39 (*) 19 - 32 mEq/L   Glucose, Bld 129 (*) 70 - 99 mg/dL   BUN 8  6 - 23 mg/dL   Creatinine, Ser 8.29  0.50 - 1.10 mg/dL   Calcium 9.2  8.4 - 56.2 mg/dL   GFR calc non Af Amer >90  >90 mL/min   GFR calc Af Amer >90  >90 mL/min  TROPONIN I      Result Value Range   Troponin I <0.30  <0.30 ng/mL   Dg Chest Port 1 View 09/29/2013   CLINICAL DATA:  Shortness of breath, weakness, cough, congestion, history asthma, COPD  EXAM: PORTABLE CHEST - 1 VIEW  COMPARISON:  Portable exam 1626 hr compared to 05/07/2013  FINDINGS: Enlargement of cardiac silhouette.  Mediastinal contours and pulmonary vascularity normal.  Lungs clear.  No definite pulmonary edema or segmental consolidation.  No pleural effusion or pneumothorax.  IMPRESSION: Enlargement of cardiac silhouette.  No acute abnormalities.   Electronically Signed   By: Ulyses Southward M.D.   On: 09/29/2013 16:37      Laray Anger, DO 10/01/13 Ninfa [place]en

## 2013-09-29 NOTE — H&P (Addendum)
[facility] History and Physical  Bethany Villa MRN:[id number] DOB: 11/25/1955 DOA: 09/29/2013   PCP: MOORE, DONALD, MD  Specialists: Unknown  Chief Complaint: Shortness of breath for 2 weeks  HPI: Bethany Villa is a 58 y.o. female with a past with a history of COPD, chronic respiratory failure, chronic pain syndrome, who came in to the emergency department due to complaints of shortness of breath, wheezing, cough for the last 2 weeks. Unfortunately, patient received some fentanyl in the ED and as a result she is very lethargic and sleepy. She is arousable, but unable to answer questions without falling asleep. She was very apologetic, but not much history was available from her. It appears, that she been symptomatic for last 2 weeks. She denied any chest pain. She admitted to some some fever, however, unable to quantify it. No nausea, or vomiting. Denies any pain apart from her chronic arthritis pain. Apparently, she was scheduled for a knee replacement at end of December. However, due to her severe COPD, it had to be canceled. So, not much history is available from the patient at this time.  Home Medications: Prior to Admission medications   Medication Sig Start Date End Date Taking? Authorizing Provider  atorvastatin (LIPITOR) 20 MG tablet Take 1 tablet (20 mg total) by mouth daily. 04/13/13  Yes Donald W Moore, MD  buPROPion (WELLBUTRIN XL) 150 MG 24 hr tablet Take 150 mg by mouth daily.  06/30/13  Yes Historical Provider, MD  cyclobenzaprine (FLEXERIL) 10 MG tablet Take 1 tablet (10 mg total) by mouth 3 (three) times daily as needed for muscle spasms. 08/03/13  Yes Mary-Margaret Martin, FNP  diazepam (VALIUM) 5 MG tablet Take 1 tablet (5 mg total) by mouth every 8 (eight) hours as needed for anxiety. 09/18/13  Yes Mary-Margaret Martin, FNP  fluticasone (FLONASE) 50 MCG/ACT nasal spray Place 2 sprays into the nose daily. 04/13/13  Yes Donald W Moore, MD  furosemide (LASIX) 40 MG tablet  Take 1 tablet (40 mg total) by mouth daily. 04/16/13  Yes Mary-Margaret Martin, FNP  gabapentin (NEURONTIN) 300 MG capsule Take 300 mg by mouth 3 (three) times daily.  08/20/13  Yes Historical Provider, MD  HYDROmorphone HCl 16 MG T24A Take 16 mg by mouth daily.  08/22/13  Yes Historical Provider, MD  levalbuterol (XOPENEX HFA) 45 MCG/ACT inhaler Inhale 2 puffs into the lungs every 4 (four) hours as needed for wheezing or shortness of breath.   Yes Historical Provider, MD  montelukast (SINGULAIR) 10 MG tablet Take 1 tablet (10 mg total) by mouth at bedtime. 04/13/13  Yes Donald W Moore, MD  olopatadine (PATANOL) 0.1 % ophthalmic solution Place 1 drop into both eyes 2 (two) times daily. 04/13/13  Yes Donald W Moore, MD  omeprazole (PRILOSEC) 20 MG capsule Take 1 capsule (20 mg total) by mouth daily. 04/13/13  Yes Donald W Moore, MD  tiotropium (SPIRIVA HANDIHALER) 18 MCG inhalation capsule Place 1 capsule (18 mcg total) into inhaler and inhale daily. 09/18/13  Yes Mary-Margaret Martin, FNP  traMADol (ULTRAM) 50 MG tablet Take 50 mg by mouth every 6 (six) hours as needed. pain   Yes Historical Provider, MD  albuterol (PROAIR HFA) 108 (90 BASE) MCG/ACT inhaler Inhale 2 puffs into the lungs every 6 (six) hours as needed for wheezing or shortness of breath. 09/27/13   Mary-Margaret Martin, FNP  albuterol (PROVENTIL) (2.5 MG/3ML) 0.083% nebulizer solution Take 3 mLs (2.5 mg total) by nebulization every 6 (six) hours as needed for   wheezing or shortness of breath. 09/24/13   Mary-Margaret Hassell Done, FNP  Fluticasone Furoate-Vilanterol 100-25 MCG/INH AEPB Inhale 1 puff into the lungs daily. 09/18/13   Mary-Margaret Hassell Done, FNP  HYDROcodone-homatropine (HYCODAN) 5-1.5 MG/5ML syrup Take 5 mLs by mouth every 8 (eight) hours as needed for cough. 09/27/13   Mary-Margaret Hassell Done, FNP  ibuprofen (ADVIL,MOTRIN) 800 MG tablet TAKE 1 TABLET (800 MG TOTAL) BY MOUTH EVERY 6 (SIX) HOURS AS NEEDED FOR PAIN. 06/30/13   Mary-Margaret  Hassell Done, FNP    Allergies:  Allergies  Allergen Reactions  . Mobic [Meloxicam] Anaphylaxis  . Penicillins Shortness Of Breath and Swelling  . Sulfa Antibiotics Shortness Of Breath and Swelling    Past Medical History: Past Medical History  Diagnosis Date  . Fibromyalgia   . Asthma   . DJD (degenerative joint disease)   . Chronic pain   . Depression   . Anxiety   . Panic attack   . Sleep disorder   . DJD (degenerative joint disease)   . GERD (gastroesophageal reflux disease)   . Hyperlipidemia   . COPD (chronic obstructive pulmonary disease)   . On home oxygen therapy     Past Surgical History  Procedure Laterality Date  . Abdominal surgery    . Back surgery    . Carpal tunnel release    . Ankle surgery    . Knee surgery    . Cesarean section    . Tonsillectomy    . Tubal ligation      Social History:  History   Social History  . Marital Status: Divorced    Spouse Name: N/A    Number of Children: N/A  . Years of Education: N/A   Occupational History  . disabled    Social History Main Topics  . Smoking status: Current Every Day Smoker -- 1.00 packs/day for 8 years    Types: Cigarettes  . Smokeless tobacco: Never Used  . Alcohol Use: No     Comment: occ  . Drug Use: No  . Sexual Activity: Yes    Birth Control/ Protection: Surgical   Other Topics Concern  . Not on file   Social History Narrative  . No narrative on file    Family History:  Family History  Problem Relation Age of Onset  . Cancer Mother     beast     Review of Systems - unable to do due to her mental status  Physical Examination  Filed Vitals:   09/29/13 1600 09/29/13 1700 09/29/13 1702 09/29/13 1800  BP: 102/63 120/70  128/80  Pulse: 104 104  95  Temp:      Resp:      Height:      Weight:      SpO2: 98% 97% 97% 97%    General appearance: distracted, no distress, morbidly obese and slowed mentation Head: Normocephalic, without obvious abnormality, atraumatic Eyes:  conjunctivae/corneas clear. PERRL, EOM's intact. Throat: dry mm Neck: no adenopathy, no carotid bruit, no JVD, supple, symmetrical, trachea midline and thyroid not enlarged, symmetric, no tenderness/mass/nodules Resp: Diffuse wheezing bilaterally. Few crackles at the bases. No rhonchi Cardio: regular rate and rhythm, S1, S2 normal, no murmur, click, rub or gallop GI: soft, non-tender; bowel sounds normal; no masses,  no organomegaly Extremities: extremities normal, atraumatic, no cyanosis or edema Pulses: 2+ and symmetric Skin: Skin color, texture, turgor normal. No rashes or lesions Lymph nodes: Cervical, supraclavicular, and axillary nodes normal. Neurologic: She is sleepy, but arousable. No focal neurological deficits  appreciated otherwise. She just kept falling asleep.  Laboratory Data: Results for orders placed during the hospital encounter of 09/29/13 (from the past 48 hour(s))  CBC WITH DIFFERENTIAL     Status: Abnormal   Collection Time    09/29/13  4:36 PM      Result Value Range   WBC 9.0  4.0 - 10.5 K/uL   RBC 4.95  3.87 - 5.11 MIL/uL   Hemoglobin 13.8  12.0 - 15.0 g/dL   HCT 45.7  36.0 - 46.0 %   MCV 92.3  78.0 - 100.0 fL   MCH 27.9  26.0 - 34.0 pg   MCHC 30.2  30.0 - 36.0 g/dL   RDW 18.3 (*) 11.5 - 15.5 %   Platelets 198  150 - 400 K/uL   Neutrophils Relative % 57  43 - 77 %   Neutro Abs 5.2  1.7 - 7.7 K/uL   Lymphocytes Relative 27  12 - 46 %   Lymphs Abs 2.4  0.7 - 4.0 K/uL   Monocytes Relative 12  3 - 12 %   Monocytes Absolute 1.0  0.1 - 1.0 K/uL   Eosinophils Relative 4  0 - 5 %   Eosinophils Absolute 0.3  0.0 - 0.7 K/uL   Basophils Relative 0  0 - 1 %   Basophils Absolute 0.0  0.0 - 0.1 K/uL  BASIC METABOLIC PANEL     Status: Abnormal   Collection Time    09/29/13  4:36 PM      Result Value Range   Sodium 141  137 - 147 mEq/L   Potassium 4.0  3.7 - 5.3 mEq/L   Chloride 95 (*) 96 - 112 mEq/L   CO2 39 (*) 19 - 32 mEq/L   Glucose, Bld 129 (*) 70 - 99 mg/dL     BUN 8  6 - 23 mg/dL   Creatinine, Ser 0.55  0.50 - 1.10 mg/dL   Calcium 9.2  8.4 - 10.5 mg/dL   GFR calc non Af Amer >90  >90 mL/min   GFR calc Af Amer >90  >90 mL/min   Comment: (NOTE)     The eGFR has been calculated using the CKD EPI equation.     This calculation has not been validated in all clinical situations.     eGFR's persistently <90 mL/min signify possible Chronic Kidney     Disease.  TROPONIN I     Status: None   Collection Time    09/29/13  4:36 PM      Result Value Range   Troponin I <0.30  <0.30 ng/mL   Comment:            Due to the release kinetics of cTnI,     a negative result within the first hours     of the onset of symptoms does not rule out     myocardial infarction with certainty.     If myocardial infarction is still suspected,     repeat the test at appropriate intervals.    Radiology Reports: Dg Chest Port 1 View  09/29/2013   CLINICAL DATA:  Shortness of breath, weakness, cough, congestion, history asthma, COPD  EXAM: PORTABLE CHEST - 1 VIEW  COMPARISON:  Portable exam 1626 hr compared to 05/07/2013  FINDINGS: Enlargement of cardiac silhouette.  Mediastinal contours and pulmonary vascularity normal.  Lungs clear.  No definite pulmonary edema or segmental consolidation.  No pleural effusion or pneumothorax.  IMPRESSION: Enlargement of cardiac silhouette.    No acute abnormalities.   Electronically Signed   By: Mark  Boles M.D.   On: 09/29/2013 16:37    Electrocardiogram: Sinus tachycardia at 106 beats per minute. Normal axis. Intervals are normal. No definite Q waves. Nonspecific T wave changes, which are chronic. No concerning ST changes are noted.  Problem List  Principal Problem:   COPD with acute exacerbation Active Problems:   Chronic pain disorder   Obesity   Acute-on-chronic respiratory failure   Acute exacerbation of chronic obstructive pulmonary disease (COPD)   Assessment: This is a 57-year-old, obese, Caucasian female, who presents  with shortness of breath ongoing for about 2 weeks. Chest x-ray does not show any pneumonia. She does mention fever at home. Differential diagnoses include acute COPD exacerbation, influenza, acute bronchitis. Did not suspect any cardiac etiology at this time.  Plan: #1 acute COPD exacerbation: She'll be given steroids, nebulizer treatments, and antibiotics. Influenza PCR will be checked. We'll check ABG as well. Her bicarbonate on basic metabolic panel is elevated, consistent with chronic respiratory failure.  #2 altered mental status: Most likely due to medications, but ABG will shed more information regarding her acid base status. Continue to monitor closely for now.  #3 history of chronic pain disorder with arthritis in the knees: We will resume her medications from tomorrow when her mental status should have returned to baseline.  #4 tobacco abuse: Smoking cessation counseling will need to be provided. Nicotine patch will be utilized.   DVT Prophylaxis: Lovenox Code Status: Full code Family Communication: No family at bedside. Disposition Plan: Admit to MedSurg   Further management decisions will depend on results of further testing and patient's response to treatment.  ,  [facility] Pager [phone]  If 7PM-7AM, please contact night-coverage [email or web address] Password [email or web address]  09/29/2013, 8:35 PM   ABG results noted and consistent with hypercapneic respiratory failure. Her somnolence could be explained by the hypercapnea as well. Will admit to stepdown and initiate Bipap. Give another Neb treatment. Repeat ABG one hour after being on bipap.  , 9:35 PM  

## 2013-09-29 NOTE — ED Notes (Signed)
Pt is currently sleeping. RT aware of Breathing tx.

## 2013-09-30 ENCOUNTER — Other Ambulatory Visit: Payer: Self-pay | Admitting: Nurse Practitioner

## 2013-09-30 DIAGNOSIS — E669 Obesity, unspecified: Secondary | ICD-10-CM

## 2013-09-30 DIAGNOSIS — J9620 Acute and chronic respiratory failure, unspecified whether with hypoxia or hypercapnia: Secondary | ICD-10-CM

## 2013-09-30 DIAGNOSIS — J441 Chronic obstructive pulmonary disease with (acute) exacerbation: Secondary | ICD-10-CM | POA: Diagnosis not present

## 2013-09-30 DIAGNOSIS — G9341 Metabolic encephalopathy: Secondary | ICD-10-CM | POA: Diagnosis not present

## 2013-09-30 LAB — BLOOD GAS, ARTERIAL
Acid-Base Excess: 10.1 mmol/L — ABNORMAL HIGH (ref 0.0–2.0)
Acid-Base Excess: 10.2 mmol/L — ABNORMAL HIGH (ref 0.0–2.0)
BICARBONATE: 35.6 meq/L — AB (ref 20.0–24.0)
Bicarbonate: 37 mEq/L — ABNORMAL HIGH (ref 20.0–24.0)
DRAWN BY: 21310
Delivery systems: POSITIVE
Delivery systems: POSITIVE
Drawn by: 23534
EXPIRATORY PAP: 6
Expiratory PAP: 5
FIO2: 35 %
FIO2: 0.45 %
INSPIRATORY PAP: 16
INSPIRATORY PAP: 16
O2 Content: 45 L/min
O2 Saturation: 89.7 %
O2 Saturation: 90.8 %
PCO2 ART: 61.3 mmHg — AB (ref 35.0–45.0)
PO2 ART: 59.6 mmHg — AB (ref 80.0–100.0)
Patient temperature: 37
Patient temperature: 37
TCO2: 33.8 mmol/L (ref 0–100)
TCO2: 31.7 mmol/L (ref 0–100)
pCO2 arterial: 83.8 mmHg (ref 35.0–45.0)
pH, Arterial: 7.268 — ABNORMAL LOW (ref 7.350–7.450)
pH, Arterial: 7.382 (ref 7.350–7.450)
pO2, Arterial: 63.5 mmHg — ABNORMAL LOW (ref 80.0–100.0)

## 2013-09-30 LAB — COMPREHENSIVE METABOLIC PANEL
ALT: 12 U/L (ref 0–35)
AST: 11 U/L (ref 0–37)
Albumin: 3 g/dL — ABNORMAL LOW (ref 3.5–5.2)
Alkaline Phosphatase: 117 U/L (ref 39–117)
BILIRUBIN TOTAL: 0.6 mg/dL (ref 0.3–1.2)
BUN: 8 mg/dL (ref 6–23)
CO2: 37 meq/L — AB (ref 19–32)
Calcium: 9.1 mg/dL (ref 8.4–10.5)
Chloride: 93 mEq/L — ABNORMAL LOW (ref 96–112)
Creatinine, Ser: 0.51 mg/dL (ref 0.50–1.10)
GFR calc Af Amer: >90 mL/min (ref >90)
GLUCOSE: 126 mg/dL — AB (ref 70–99)
Potassium: 4.4 mEq/L (ref 3.7–5.3)
SODIUM: 140 meq/L (ref 137–147)
Total Protein: 7.2 g/dL (ref 6.0–8.3)

## 2013-09-30 LAB — CBC
HCT: 48.6 % — ABNORMAL HIGH (ref 36.0–46.0)
Hemoglobin: 14.3 g/dL (ref 12.0–15.0)
MCH: 27.8 pg (ref 26.0–34.0)
MCHC: 29.4 g/dL — AB (ref 30.0–36.0)
MCV: 94.6 fL (ref 78.0–100.0)
PLATELETS: 214 10*3/uL (ref 150–400)
RBC: 5.14 MIL/uL — ABNORMAL HIGH (ref 3.87–5.11)
RDW: 18.5 % — AB (ref 11.5–15.5)
WBC: 8.5 10*3/uL (ref 4.0–10.5)

## 2013-09-30 LAB — GLUCOSE, CAPILLARY
GLUCOSE-CAPILLARY: 146 mg/dL — AB (ref 70–99)
GLUCOSE-CAPILLARY: 144 mg/dL — AB (ref 70–99)
Glucose-Capillary: 202 mg/dL — ABNORMAL HIGH (ref 70–99)
Glucose-Capillary: 244 mg/dL — ABNORMAL HIGH (ref 70–99)

## 2013-09-30 MED ORDER — CHLORHEXIDINE GLUCONATE 0.12 % MT SOLN
15.0000 mL | Freq: Two times a day (BID) | OROMUCOSAL | Status: DC
  Administered 2013-09-30 – 2013-10-02 (×5): 15 mL via OROMUCOSAL
  Filled 2013-09-30 (×6): qty 15

## 2013-09-30 MED ORDER — BIOTENE DRY MOUTH MT LIQD
15.0000 mL | Freq: Two times a day (BID) | OROMUCOSAL | Status: DC
  Administered 2013-09-30 – 2013-10-03 (×6): 15 mL via OROMUCOSAL

## 2013-09-30 NOTE — Progress Notes (Signed)
[facility] PROGRESS NOTE  Bethany Villa ZOX:[id number] DOB: 12/10/1955 DOA: 09/29/2013 PCP: Rudi HeapMOORE, DONALD, MD  Assessment/Plan: 1. Acute on chronic respiratory failure.  Related to COPD.  Baseline PcO2 is likely in 60s. Patient is currently on bipap, will wean off oxygen as tolerated. Repeat abg now to track progression. 2. COPD.  On steroids, antibiotics and bronchodilators.  She is on continuous bipap for now.  Will repeat abg to see if this can be weaned off 3. Metabolic encephalopathy, likely due to hypercarbia.  Improving. 4. Chronic pain disorder. Mediations currently on hold due to mental status.  Code Status: full code Family Communication: no family present Disposition Plan: discharge home once improved   Consultants:  none  Procedures:  none  Antibiotics:  levaquin 09/29/13  HPI/Subjective: Feeling better, not as short of breath, lethargy appears to be improving as well  Objective: Filed Vitals:   09/30/13 0900  BP: 129/92  Pulse: 78  Temp:   Resp: 25    Intake/Output Summary (Last 24 hours) at 09/30/13 0947 Last data filed at 09/30/13 0600  Gross per 24 hour  Intake 1759.58 ml  Output      0 ml  Net 1759.58 ml   Filed Weights   09/29/13 1533 09/29/13 2202 09/30/13 0500  Weight: 77.111 kg (170 lb) 100.5 kg (221 lb 9 oz) 101.5 kg (223 lb 12.3 oz)    Exam:   General:  NAD, on bipap, wakes up to voice and answers questions  Cardiovascular: s1, s2, rrr  Respiratory: diminished breath sounds but no wheezing  Abdomen: soft, nt, obese, bs+  Musculoskeletal: no edema b/l   Data Reviewed: Basic Metabolic Panel:  Recent Labs Lab 09/29/13 1636 09/30/13 0451  NA 141 140  K 4.0 4.4  CL 95* 93*  CO2 39* 37*  GLUCOSE 129* 126*  BUN 8 8  CREATININE 0.55 0.51  CALCIUM 9.2 9.1   Liver Function Tests:  Recent Labs Lab 09/30/13 0451  AST 11  ALT 12  ALKPHOS 117  BILITOT 0.6  PROT 7.2  ALBUMIN 3.0*   No results found for this  basename: LIPASE, AMYLASE,  in the last 168 hours No results found for this basename: AMMONIA,  in the last 168 hours CBC:  Recent Labs Lab 09/29/13 1636 09/30/13 0451  WBC 9.0 8.5  NEUTROABS 5.2  --   HGB 13.8 14.3  HCT 45.7 48.6*  MCV 92.3 94.6  PLT 198 214   Cardiac Enzymes:  Recent Labs Lab 09/29/13 1636  TROPONINI <0.30   BNP (last 3 results)  Recent Labs  11/27/12 1534 05/05/13 1553  PROBNP 228.4* 3032.0*   CBG:  Recent Labs Lab 09/29/13 2243 09/30/13 0728  GLUCAP 96 146*    Recent Results (from the past 240 hour(s))  MRSA PCR SCREENING     Status: None   Collection Time    09/29/13 10:02 PM      Result Value Range Status   MRSA by PCR NEGATIVE  NEGATIVE Final   Comment:            The GeneXpert MRSA Assay (FDA     approved for NASAL specimens     only), is one component of a     comprehensive MRSA colonization     surveillance program. It is not     intended to diagnose MRSA     infection nor to guide or     monitor treatment for     MRSA infections.  Studies: Dg Chest Port 1 View  09/29/2013   CLINICAL DATA:  Shortness of breath, weakness, cough, congestion, history asthma, COPD  EXAM: PORTABLE CHEST - 1 VIEW  COMPARISON:  Portable exam 1626 hr compared to 05/07/2013  FINDINGS: Enlargement of cardiac silhouette.  Mediastinal contours and pulmonary vascularity normal.  Lungs clear.  No definite pulmonary edema or segmental consolidation.  No pleural effusion or pneumothorax.  IMPRESSION: Enlargement of cardiac silhouette.  No acute abnormalities.   Electronically Signed   By: Ulyses Southward M.D.   On: 09/29/2013 16:37    Scheduled Meds: . albuterol  2.5 mg Nebulization Q4H  . albuterol  5 mg/hr Nebulization Once  . antiseptic oral rinse  15 mL Mouth Rinse q12n4p  . atorvastatin  20 mg Oral Daily  . buPROPion  150 mg Oral Daily  . chlorhexidine  15 mL Mouth Rinse BID  . docusate sodium  100 mg Oral BID  . enoxaparin (LOVENOX) injection  40  mg Subcutaneous Q24H  . Fluticasone Furoate-Vilanterol  1 puff Inhalation Daily  . furosemide  40 mg Oral Daily  . gabapentin  300 mg Oral TID  . guaiFENesin  600 mg Oral BID  . HYDROmorphone HCl  16 mg Oral Daily  . insulin aspart  0-15 Units Subcutaneous TID WC  . levofloxacin (LEVAQUIN) IV  500 mg Intravenous Q24H  . methylPREDNISolone (SOLU-MEDROL) injection  60 mg Intravenous Q6H  . nicotine  14 mg Transdermal Daily  . olopatadine  1 drop Both Eyes BID  . pantoprazole  40 mg Oral Daily  . tiotropium  18 mcg Inhalation Daily   Continuous Infusions: . sodium chloride 75 mL/hr at 09/30/13 0447    Principal Problem:   COPD with acute exacerbation Active Problems:   Chronic pain disorder   Obesity   Acute-on-chronic respiratory failure   Acute exacerbation of chronic obstructive pulmonary disease (COPD)   Acute respiratory failure    Time spent:    [facility]  [facility] Pager [phone]. If 7PM-7AM, please contact night-coverage at [email or web address], password [facility] 09/30/2013, 9:47 AM  LOS: 1 day

## 2013-09-30 NOTE — Progress Notes (Signed)
Patient had two critical ABG CO2 of 97.6 at 2300 and 83.8 at 0300.  Called to Dr. Barnie DelG. Krishnan on-call. Patient's mental status is drowsiness but arousable and able to follow commands.

## 2013-10-01 ENCOUNTER — Encounter (HOSPITAL_COMMUNITY): Payer: Self-pay

## 2013-10-01 DIAGNOSIS — J9620 Acute and chronic respiratory failure, unspecified whether with hypoxia or hypercapnia: Secondary | ICD-10-CM | POA: Diagnosis not present

## 2013-10-01 DIAGNOSIS — G9341 Metabolic encephalopathy: Secondary | ICD-10-CM | POA: Diagnosis not present

## 2013-10-01 DIAGNOSIS — J441 Chronic obstructive pulmonary disease with (acute) exacerbation: Secondary | ICD-10-CM | POA: Diagnosis not present

## 2013-10-01 DIAGNOSIS — G894 Chronic pain syndrome: Secondary | ICD-10-CM | POA: Diagnosis not present

## 2013-10-01 LAB — BASIC METABOLIC PANEL
BUN: 16 mg/dL (ref 6–23)
CHLORIDE: 94 meq/L — AB (ref 96–112)
CO2: 36 meq/L — AB (ref 19–32)
Calcium: 8.8 mg/dL (ref 8.4–10.5)
Creatinine, Ser: 0.56 mg/dL (ref 0.50–1.10)
GFR calc Af Amer: >90 mL/min (ref >90)
GLUCOSE: 134 mg/dL — AB (ref 70–99)
Potassium: 3.6 mEq/L — ABNORMAL LOW (ref 3.7–5.3)
SODIUM: 139 meq/L (ref 137–147)

## 2013-10-01 LAB — BLOOD GAS, ARTERIAL
ACID-BASE EXCESS: 11.4 mmol/L — AB (ref 0.0–2.0)
ACID-BASE EXCESS: 11.5 mmol/L — AB (ref 0.0–2.0)
BICARBONATE: 36.6 meq/L — AB (ref 20.0–24.0)
Bicarbonate: 39.1 mEq/L — ABNORMAL HIGH (ref 20.0–24.0)
Delivery systems: POSITIVE
Delivery systems: POSITIVE
Drawn by: 21310
Drawn by: 21310
Expiratory PAP: 6
Expiratory PAP: 6
FIO2: 45 %
FIO2: 35 %
Inspiratory PAP: 12
Inspiratory PAP: 16
O2 SAT: 96.6 %
O2 Saturation: 94.1 %
PCO2 ART: 97.6 mmHg — AB (ref 35.0–45.0)
PO2 ART: 72.1 mmHg — AB (ref 80.0–100.0)
Patient temperature: 37
Patient temperature: 37
TCO2: 36.1 mmol/L (ref 0–100)
TCO2: 32.2 mmol/L (ref 0–100)
pCO2 arterial: 59.5 mmHg (ref 35.0–45.0)
pH, Arterial: 7.227 — ABNORMAL LOW (ref 7.350–7.450)
pH, Arterial: 7.406 (ref 7.350–7.450)
pO2, Arterial: 105 mmHg — ABNORMAL HIGH (ref 80.0–100.0)

## 2013-10-01 LAB — GLUCOSE, CAPILLARY
GLUCOSE-CAPILLARY: 147 mg/dL — AB (ref 70–99)
GLUCOSE-CAPILLARY: 158 mg/dL — AB (ref 70–99)
Glucose-Capillary: 182 mg/dL — ABNORMAL HIGH (ref 70–99)
Glucose-Capillary: 118 mg/dL — ABNORMAL HIGH (ref 70–99)

## 2013-10-01 MED ORDER — MOMETASONE FURO-FORMOTEROL FUM 100-5 MCG/ACT IN AERO
2.0000 | INHALATION_SPRAY | Freq: Two times a day (BID) | RESPIRATORY_TRACT | Status: DC
  Administered 2013-10-01 – 2013-10-03 (×4): 2 via RESPIRATORY_TRACT
  Filled 2013-10-01: qty 8.8

## 2013-10-01 MED ORDER — HYDROMORPHONE HCL 4 MG PO TABS
4.0000 mg | ORAL_TABLET | Freq: Four times a day (QID) | ORAL | Status: DC
  Administered 2013-10-01 – 2013-10-02 (×5): 4 mg via ORAL
  Filled 2013-10-01 (×5): qty 1

## 2013-10-01 MED ORDER — POTASSIUM CHLORIDE CRYS ER 20 MEQ PO TBCR
40.0000 meq | EXTENDED_RELEASE_TABLET | Freq: Once | ORAL | Status: AC
  Administered 2013-10-01: 40 meq via ORAL
  Filled 2013-10-01: qty 2

## 2013-10-01 MED ORDER — METHYLPREDNISOLONE SODIUM SUCC 125 MG IJ SOLR
60.0000 mg | Freq: Two times a day (BID) | INTRAMUSCULAR | Status: DC
  Administered 2013-10-01 – 2013-10-02 (×2): 60 mg via INTRAVENOUS
  Filled 2013-10-01 (×2): qty 2

## 2013-10-01 NOTE — Progress Notes (Signed)
[facility] PROGRESS NOTE  Basilio Cairoenny E Klare WGN:[id number]:[id number] DOB: 12/31/1955 DOA: 09/29/2013 PCP: Rudi HeapMOORE, DONALD, MD  Assessment/Plan: 1. Acute on chronic respiratory failure.  Related to COPD.  Baseline PcO2 is likely in 60s. Patient is improved with bipap therapy.  Continue current treatments. 2. COPD.  On steroids, antibiotics and bronchodilators.  Patient has been weaned back to baseline oxygen requirement. Will start to wean steroids. Abg is improved. 3. Metabolic encephalopathy, likely due to hypercarbia.  Resolved. 4. Chronic pain disorder. Restart chronic medications. Pharmacy to substitute for non formulary items.  Code Status: full code Family Communication: no family present Disposition Plan: discharge home once improved, transfer telemetry   Consultants:  none  Procedures:  none  Antibiotics:  levaquin 09/29/13  HPI/Subjective: Sitting up in bed, feeling better, awake and alert  Objective: Filed Vitals:   10/01/13 1215  BP:   Pulse:   Temp: 98.9 F (37.2 C)  Resp:     Intake/Output Summary (Last 24 hours) at 10/01/13 1225 Last data filed at 10/01/13 0800  Gross per 24 hour  Intake    820 ml  Output    700 ml  Net    120 ml   Filed Weights   09/29/13 2202 09/30/13 0500 10/01/13 0500  Weight: 100.5 kg (221 lb 9 oz) 101.5 kg (223 lb 12.3 oz) 102.2 kg (225 lb 5 oz)    Exam:   General:  NAD, appears comfortable, eating lunch  Cardiovascular: s1, s2, rrr  Respiratory: scattered wheezing bilaterally, improving air entry  Abdomen: soft, nt, obese, bs+  Musculoskeletal: no edema b/l   Data Reviewed: Basic Metabolic Panel:  Recent Labs Lab 09/29/13 1636 09/30/13 0451 10/01/13 0512  NA 141 140 139  K 4.0 4.4 3.6*  CL 95* 93* 94*  CO2 39* 37* 36*  GLUCOSE 129* 126* 134*  BUN 8 8 16   CREATININE 0.55 0.51 0.56  CALCIUM 9.2 9.1 8.8   Liver Function Tests:  Recent Labs Lab 09/30/13 0451  AST 11  ALT 12  ALKPHOS 117  BILITOT 0.6   PROT 7.2  ALBUMIN 3.0*   No results found for this basename: LIPASE, AMYLASE,  in the last 168 hours No results found for this basename: AMMONIA,  in the last 168 hours CBC:  Recent Labs Lab 09/29/13 1636 09/30/13 0451  WBC 9.0 8.5  NEUTROABS 5.2  --   HGB 13.8 14.3  HCT 45.7 48.6*  MCV 92.3 94.6  PLT 198 214   Cardiac Enzymes:  Recent Labs Lab 09/29/13 1636  TROPONINI <0.30   BNP (last 3 results)  Recent Labs  11/27/12 1534 05/05/13 1553  PROBNP 228.4* 3032.0*   CBG:  Recent Labs Lab 09/30/13 1118 09/30/13 1619 09/30/13 2110 10/01/13 0738 10/01/13 1128  GLUCAP 144* 202* 244* 147* 182*    Recent Results (from the past 240 hour(s))  MRSA PCR SCREENING     Status: None   Collection Time    09/29/13 10:02 PM      Result Value Range Status   MRSA by PCR NEGATIVE  NEGATIVE Final   Comment:            The GeneXpert MRSA Assay (FDA     approved for NASAL specimens     only), is one component of a     comprehensive MRSA colonization     surveillance program. It is not     intended to diagnose MRSA     infection nor to guide or  monitor treatment for     MRSA infections.     Studies: Dg Chest Port 1 View  09/29/2013   CLINICAL DATA:  Shortness of breath, weakness, cough, congestion, history asthma, COPD  EXAM: PORTABLE CHEST - 1 VIEW  COMPARISON:  Portable exam 1626 hr compared to 05/07/2013  FINDINGS: Enlargement of cardiac silhouette.  Mediastinal contours and pulmonary vascularity normal.  Lungs clear.  No definite pulmonary edema or segmental consolidation.  No pleural effusion or pneumothorax.  IMPRESSION: Enlargement of cardiac silhouette.  No acute abnormalities.   Electronically Signed   By: Ulyses Southward M.D.   On: 09/29/2013 16:37    Scheduled Meds: . albuterol  2.5 mg Nebulization Q4H  . albuterol  5 mg/hr Nebulization Once  . antiseptic oral rinse  15 mL Mouth Rinse q12n4p  . atorvastatin  20 mg Oral Daily  . buPROPion  150 mg Oral Daily   . chlorhexidine  15 mL Mouth Rinse BID  . docusate sodium  100 mg Oral BID  . enoxaparin (LOVENOX) injection  40 mg Subcutaneous Q24H  . furosemide  40 mg Oral Daily  . gabapentin  300 mg Oral TID  . guaiFENesin  600 mg Oral BID  . HYDROmorphone  4 mg Oral Q6H  . insulin aspart  0-15 Units Subcutaneous TID WC  . levofloxacin (LEVAQUIN) IV  500 mg Intravenous Q24H  . methylPREDNISolone (SOLU-MEDROL) injection  60 mg Intravenous Q6H  . mometasone-formoterol  2 puff Inhalation BID  . nicotine  14 mg Transdermal Daily  . olopatadine  1 drop Both Eyes BID  . pantoprazole  40 mg Oral Daily  . tiotropium  18 mcg Inhalation Daily   Continuous Infusions:    Principal Problem:   COPD with acute exacerbation Active Problems:   Chronic pain disorder   Obesity   Acute-on-chronic respiratory failure   Acute exacerbation of chronic obstructive pulmonary disease (COPD)   Acute respiratory failure    Time spent:    [facility]  [facility] Pager [phone]. If 7PM-7AM, please contact night-coverage at [email or web address], password [facility] 10/01/2013, 12:25 PM  LOS: 2 days

## 2013-10-01 NOTE — Care Management Note (Signed)
    Page 1 of 1   10/01/2013     2:33:45 PM   CARE MANAGEMENT NOTE 10/01/2013  Patient:  Bethany Villa,Bethany Villa   Account Number:  [id number]  Date Initiated:  10/01/2013  Documentation initiated by:  Sharrie RothmanBLACKWELL,Kaidan Spengler C  Subjective/Objective Assessment:   Pt admitted from home with COPD. Pt lives alone but as a son that is active in the care of the pt. Pt has home O2, neb machine, and pulse ox for home use. Pt is fairly independent with ADL's.     Action/Plan:   No CM needs noted.   Anticipated DC Date:  10/04/2013   Anticipated DC Plan:  HOME/SELF CARE      DC Planning Services  CM consult      Choice offered to / List presented to:             Status of service:  Completed, signed off Medicare Important Message given?   (If response is "NO", the following Medicare IM given date fields will be blank) Date Medicare IM given:   Date Additional Medicare IM given:    Discharge Disposition:  HOME/SELF CARE  Per UR Regulation:    If discussed at Long Length of Stay Meetings, dates discussed:    Comments:  10/01/13 1430 Arlyss Queenammy Kizzi Overbey, RN BSN CM

## 2013-10-01 NOTE — Progress Notes (Signed)
Inpatient Diabetes Program Recommendations  AACE/ADA: New Consensus Statement on Inpatient Glycemic Control (2013)  Target Ranges:  Prepandial:   less than 140 mg/dL      Peak postprandial:   less than 180 mg/dL (1-2 hours)      Critically ill patients:  140 - 180 mg/dL   Reason for Visit: Results for [name], Tinesha E (MRN [id number]) as of 10/01/2013 12:47  Ref. Range 09/30/2013 11:18 09/30/2013 16:19 09/30/2013 21:10 10/01/2013 07:38 10/01/2013 11:28  Glucose-Capillary Latest Range: 70-99 mg/dL 782144 (H) 956202 (H) 213244 (H) 147 (H) 182 (H)    Diabetes history: No history-Patient is on IV steroids Current orders for Inpatient glycemic control: Novolog moderate tid with meals and HS  May consider increasing Novolog correction to resistant if CBG's remain greater than 180 mg/dL.   Thanks, Beryl MeagerJenny Sneha Willig, RN, BC-ADM Inpatient Diabetes Coordinator Pager [phone]

## 2013-10-01 NOTE — Progress Notes (Signed)
Report [facility] and [facility] to [facility]. Patient alert, oriented and in stable condition at the time of transport. Patient transported in wheelchair to room 320. Patient's belongings transported with her.

## 2013-10-01 NOTE — Progress Notes (Signed)
Patient is currently not wearing a BIPAP at this time but if needed one will be made available.

## 2013-10-01 NOTE — Progress Notes (Signed)
UR chart review completed.  

## 2013-10-01 NOTE — Progress Notes (Signed)
CRITICAL VALUE ALERT  Critical value received:  CO2 59.5  Date of notification:  10/01/13  Time of notification:  0445  Critical value read back: yes  Nurse who received alert:  Foye Deer. Ebunoluwa Gernert RN  MD notified (1st page):  Dr. Barnie DelG. Krishnan  Time of first page:  0505  MD notified (2nd page):  Time of second page:  Responding MD:    Time MD responded:  Down from previous critical readings.

## 2013-10-02 ENCOUNTER — Telehealth: Payer: Self-pay | Admitting: Nurse Practitioner

## 2013-10-02 ENCOUNTER — Other Ambulatory Visit: Payer: Self-pay | Admitting: Nurse Practitioner

## 2013-10-02 DIAGNOSIS — E872 Acidosis, unspecified: Secondary | ICD-10-CM

## 2013-10-02 DIAGNOSIS — G9341 Metabolic encephalopathy: Secondary | ICD-10-CM | POA: Diagnosis not present

## 2013-10-02 DIAGNOSIS — J9602 Acute respiratory failure with hypercapnia: Secondary | ICD-10-CM | POA: Diagnosis present

## 2013-10-02 DIAGNOSIS — J9620 Acute and chronic respiratory failure, unspecified whether with hypoxia or hypercapnia: Secondary | ICD-10-CM | POA: Diagnosis not present

## 2013-10-02 DIAGNOSIS — J441 Chronic obstructive pulmonary disease with (acute) exacerbation: Secondary | ICD-10-CM | POA: Diagnosis not present

## 2013-10-02 LAB — GLUCOSE, CAPILLARY
GLUCOSE-CAPILLARY: 43 mg/dL — AB (ref 70–99)
GLUCOSE-CAPILLARY: 108 mg/dL — AB (ref 70–99)
GLUCOSE-CAPILLARY: 105 mg/dL — AB (ref 70–99)
GLUCOSE-CAPILLARY: 195 mg/dL — AB (ref 70–99)
Glucose-Capillary: 117 mg/dL — ABNORMAL HIGH (ref 70–99)
Glucose-Capillary: 88 mg/dL (ref 70–99)

## 2013-10-02 LAB — BASIC METABOLIC PANEL
BUN: 16 mg/dL (ref 6–23)
CO2: 36 mEq/L — ABNORMAL HIGH (ref 19–32)
Calcium: 8.6 mg/dL (ref 8.4–10.5)
Chloride: 95 mEq/L — ABNORMAL LOW (ref 96–112)
Creatinine, Ser: 0.63 mg/dL (ref 0.50–1.10)
GFR calc Af Amer: >90 mL/min (ref >90)
GFR calc non Af Amer: >90 mL/min (ref >90)
Glucose, Bld: 115 mg/dL — ABNORMAL HIGH (ref 70–99)
Potassium: 4.3 mEq/L (ref 3.7–5.3)
Sodium: 140 mEq/L (ref 137–147)

## 2013-10-02 MED ORDER — FLUTICASONE PROPIONATE 50 MCG/ACT NA SUSP
1.0000 | NASAL | Status: DC | PRN
  Administered 2013-10-02 – 2013-10-03 (×2): 1 via NASAL
  Filled 2013-10-02: qty 16

## 2013-10-02 MED ORDER — ALBUTEROL SULFATE (2.5 MG/3ML) 0.083% IN NEBU
2.5000 mg | INHALATION_SOLUTION | Freq: Two times a day (BID) | RESPIRATORY_TRACT | Status: DC
  Administered 2013-10-02 – 2013-10-03 (×2): 2.5 mg via RESPIRATORY_TRACT
  Filled 2013-10-02 (×2): qty 3

## 2013-10-02 MED ORDER — DIAZEPAM 5 MG PO TABS: 5.0000 mg | ORAL_TABLET | Freq: Three times a day (TID) | ORAL | Status: DC | PRN

## 2013-10-02 MED ORDER — PREDNISONE 20 MG PO TABS
40.0000 mg | ORAL_TABLET | Freq: Every day | ORAL | Status: DC
  Administered 2013-10-03: 40 mg via ORAL
  Filled 2013-10-02: qty 2

## 2013-10-02 MED ORDER — LEVOFLOXACIN 500 MG PO TABS
500.0000 mg | ORAL_TABLET | Freq: Every day | ORAL | Status: DC
  Administered 2013-10-02 – 2013-10-03 (×2): 500 mg via ORAL
  Filled 2013-10-02 (×2): qty 1

## 2013-10-02 NOTE — Progress Notes (Signed)
PHARMACIST - PHYSICIAN COMMUNICATION DR:   Kerry HoughMemon CONCERNING: Antibiotic IV to Oral Route Change Policy  RECOMMENDATION: This patient is receiving Levaquin by the intravenous route.  Based on criteria approved by the Pharmacy and Therapeutics Committee, the antibiotic(s) is/are being converted to the equivalent oral dose form(s).   DESCRIPTION: These criteria include:  Patient being treated for a respiratory tract infection, urinary tract infection, cellulitis or clostridium difficile associated diarrhea if on metronidazole  The patient is not neutropenic and does not exhibit a GI malabsorption state  The patient is eating (either orally or via tube) and/or has been taking other orally administered medications for a least 24 hours  The patient is improving clinically and has a Tmax < 100.5  If you have questions about this conversion, please contact the Pharmacy Department  [x]   [phone]( [phone] )  [facility] []   [phone] )  [facility]  []   [phone]( [phone] )  [facility] []   [phone]  [facility]   Junita PushMichelle Ryzen Deady, PharmD, BCPS 10/02/2013@10 :40 AM

## 2013-10-02 NOTE — Progress Notes (Addendum)
CRITICAL VALUE ALERT  Critical value received:  CBG  Date of notification:  10/02/13  Time of notification:  1208  Critical value read back:yes  Nurse who received alert:  Sammuel BailiffSarah Heath, RN   MD notified (1st page):  Dr. Kerry HoughMemon  Time of first page:  1210  MD notified (2nd page):  Time of second page:  Responding MD:  Dr. Kerry HoughMemon  Time MD responded:  1214  1230: Pt's CBG came up to 88 after administration of oral orange juice.

## 2013-10-02 NOTE — Telephone Encounter (Signed)
Patient needs to come back in for follow up to recheck lungs

## 2013-10-02 NOTE — Progress Notes (Signed)
[facility] PROGRESS NOTE  Bethany Villa VOZ:[id number] DOB: 03/20/1956 DOA: 09/29/2013 PCP: Rudi HeapMOORE, DONALD, MD   Brief summary:  This patient who has known, advanced COPD, presents to the emergency room with shortness of breath. She was found to have a significant COPD exacerbation with associated respiratory acidosis and metabolic encephalopathy. The patient was admitted to the step down unit and started on BiPAP therapy. With continued therapy, her blood gas to improve and PCO2 levels trended down. Her mental status improved and she was able to wean off of the BiPAP. She was treated conservatively with nebulizer treatments, steroids and antibiotics. She is now down to 4 L nasal cannula which will be further weaned today. Patient will be ambulated today. I anticipate that she will be ready for discharge the next 24-48 hours as she appears to be approaching her baseline. She reports that she uses oxygen at night as well as during the day when necessary. She may need to go home on oxygen 24 hours.  Assessment/Plan: 1. Acute on chronic respiratory failure.  Related to COPD.  Baseline PcO2 is likely in 60s. Patient is improved with bipap therapy.  Continue current treatments. 2. COPD.  On steroids, antibiotics and bronchodilators.  Patient has been weaned back to baseline oxygen requirement. Will start to wean steroids. Abg is improved. We'll recheck in a.m. 3. Acute on chronic respiratory acidosis. Improved with BiPAP therapy. Recheck ABG in AM. 4. Metabolic encephalopathy, likely due to hypercarbia.  Resolved. 5. Chronic pain disorder. Restart chronic medications. Pharmacy to substitute for non formulary items. Followup pain management clinic.  Code Status: full code Family Communication: no family present Disposition Plan: discharge home once improved, possibly tomorrow   Consultants:  none  Procedures:  none  Antibiotics:  levaquin 09/29/13  HPI/Subjective: Feels tired today.  Wants to sleep. She is starting to expectorate some sputum.  Objective: Filed Vitals:   10/02/13 1805  BP: 132/83  Pulse: 90  Temp: 98.2 F (36.8 C)  Resp: 20    Intake/Output Summary (Last 24 hours) at 10/02/13 1902 Last data filed at 10/02/13 1800  Gross per 24 hour  Intake   1396 ml  Output      0 ml  Net   1396 ml   Filed Weights   09/30/13 0500 10/01/13 0500 10/01/13 1530  Weight: 101.5 kg (223 lb 12.3 oz) 102.2 kg (225 lb 5 oz) 102.4 kg (225 lb 12 oz)    Exam:   General:  NAD, appears comfortable, eating lunch  Cardiovascular: s1, s2, rrr  Respiratory: Diminished air sounds bilaterally, but otherwise clear  Abdomen: soft, nt, obese, bs+  Musculoskeletal: no edema b/l   Data Reviewed: Basic Metabolic Panel:  Recent Labs Lab 09/29/13 1636 09/30/13 0451 10/01/13 0512 10/02/13 0611  NA 141 140 139 140  K 4.0 4.4 3.6* 4.3  CL 95* 93* 94* 95*  CO2 39* 37* 36* 36*  GLUCOSE 129* 126* 134* 115*  BUN 8 8 16 16   CREATININE 0.55 0.51 0.56 0.63  CALCIUM 9.2 9.1 8.8 8.6   Liver Function Tests:  Recent Labs Lab 09/30/13 0451  AST 11  ALT 12  ALKPHOS 117  BILITOT 0.6  PROT 7.2  ALBUMIN 3.0*   No results found for this basename: LIPASE, AMYLASE,  in the last 168 hours No results found for this basename: AMMONIA,  in the last 168 hours CBC:  Recent Labs Lab 09/29/13 1636 09/30/13 0451  WBC 9.0 8.5  NEUTROABS 5.2  --  HGB 13.8 14.3  HCT 45.7 48.6*  MCV 92.3 94.6  PLT 198 214   Cardiac Enzymes:  Recent Labs Lab 09/29/13 1636  TROPONINI <0.30   BNP (last 3 results)  Recent Labs  11/27/12 1534 05/05/13 1553  PROBNP 228.4* 3032.0*   CBG:  Recent Labs Lab 10/01/13 2104 10/02/13 0758 10/02/13 1157 10/02/13 1229 10/02/13 1708  GLUCAP 158* 117* 43* 88 108*    Recent Results (from the past 240 hour(s))  MRSA PCR SCREENING     Status: None   Collection Time    09/29/13 10:02 PM      Result Value Range Status   MRSA by PCR  NEGATIVE  NEGATIVE Final   Comment:            The GeneXpert MRSA Assay (FDA     approved for NASAL specimens     only), is one component of a     comprehensive MRSA colonization     surveillance program. It is not     intended to diagnose MRSA     infection nor to guide or     monitor treatment for     MRSA infections.     Studies: No results found.  Scheduled Meds: . albuterol  2.5 mg Nebulization BID  . albuterol  5 mg/hr Nebulization Once  . antiseptic oral rinse  15 mL Mouth Rinse q12n4p  . atorvastatin  20 mg Oral Daily  . buPROPion  150 mg Oral Daily  . chlorhexidine  15 mL Mouth Rinse BID  . docusate sodium  100 mg Oral BID  . enoxaparin (LOVENOX) injection  40 mg Subcutaneous Q24H  . furosemide  40 mg Oral Daily  . gabapentin  300 mg Oral TID  . guaiFENesin  600 mg Oral BID  . HYDROmorphone  4 mg Oral Q6H  . insulin aspart  0-15 Units Subcutaneous TID WC  . levofloxacin  500 mg Oral QHS  . methylPREDNISolone (SOLU-MEDROL) injection  60 mg Intravenous Q12H  . mometasone-formoterol  2 puff Inhalation BID  . nicotine  14 mg Transdermal Daily  . olopatadine  1 drop Both Eyes BID  . pantoprazole  40 mg Oral Daily  . tiotropium  18 mcg Inhalation Daily   Continuous Infusions:    Principal Problem:   COPD with acute exacerbation Active Problems:   Chronic pain disorder   Obesity   Metabolic encephalopathy   Acute-on-chronic respiratory failure   Acute exacerbation of chronic obstructive pulmonary disease (COPD)   Acute respiratory failure   Acute respiratory acidosis    Time spent:    [facility]  [facility] Pager [phone]. If 7PM-7AM, please contact night-coverage at [email or web address], password [facility] 10/02/2013, 7:02 PM  LOS: 3 days

## 2013-10-02 NOTE — Evaluation (Signed)
Physical Therapy Evaluation Patient Details Name: Bethany Villa MRN: [id number] DOB: 01/26/1956 Today's Date: 10/02/2013 Time: [id number] PT Time Calculation (min): 15 min  PT Assessment / Plan / Recommendation History of Present Illness  Pt with exacerbation of COPD is concerned about her ability to walk.  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  She lives alone and has been independent in ADLs.  Clinical Impression   Pt was seen for evaluation.  She states that she has chronic left knee pain and feels unstable when walking.  She is able to ambulate with no assistive device but when instructed in gait with a walker she felt more stable.  No further instruction should be needed.    PT Assessment  Patent does not need any further PT services    Follow Up Recommendations  No PT follow up    Does the patient have the potential to tolerate intense rehabilitation      Barriers to Discharge        Equipment Recommendations  Cane    Recommendations for Other Services     Frequency      Precautions / Restrictions Precautions Precautions: None Restrictions Weight Bearing Restrictions: No   Pertinent Vitals/Pain       Mobility  Bed Mobility Overal bed mobility: Independent Transfers Overall transfer level: Independent Ambulation/Gait Ambulation/Gait assistance: Independent Ambulation Distance (Feet): 80 Feet Assistive device: None;Straight cane Gait Pattern/deviations: Antalgic;Decreased stance time - left General Gait Details: pt was instructed in gait with a cane in order to alleviate left knee pain when walking    Exercises     PT Diagnosis:    PT Problem List:   PT Treatment Interventions:       PT Goals(Current goals can be found in the care plan section) Acute Rehab PT Goals PT Goal Formulation: No goals set, d/c therapy  Visit Information  Last PT Received On: 10/02/13 History of Present Illness: Pt with  exacerbation of COPD is concerned about her ability to walk.  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  She lives alone and has been independent in ADLs.       Prior Functioning  Home Living Family/patient expects to be discharged to:: Private residence Living Arrangements: Alone Type of Home: House Home Access: Stairs to enter Secretary/administratorntrance Stairs-Number of Steps: 2 Entrance Stairs-Rails: Right Home Layout: One level Home Equipment: None Prior Function Level of Independence: Independent Communication Communication: No difficulties    Cognition  Cognition Arousal/Alertness: Awake/alert Behavior During Therapy: WFL for tasks assessed/performed Overall Cognitive Status: Within Functional Limits for tasks assessed    Extremity/Trunk Assessment Lower Extremity Assessment Lower Extremity Assessment: Overall WFL for tasks assessed   Balance Balance Overall balance assessment: No apparent balance deficits (not formally assessed)  End of Session PT - End of Session Equipment Utilized During Treatment: Gait belt Activity Tolerance: Patient tolerated treatment well Patient left: in bed;with call bell/phone within reach  GP     Myrlene BrokerBrown, Rhylin Venters L 10/02/2013, 3:10 PM

## 2013-10-02 NOTE — Telephone Encounter (Signed)
Patient aware rx ready to be picked up 

## 2013-10-02 NOTE — Telephone Encounter (Signed)
Patient aware that she still needs to be seen to be cleared for surgery

## 2013-10-02 NOTE — Clinical Documentation Improvement (Signed)
Please clarify. Thank you.  Possible Clinical Conditions?   " Respiratory Acidosis " Metabolic Acidosis " Respiratory Alkalosis " Metabolic Alkalosis " Other Condition__________________ " Cannot Clinically Determine   Supporting Information: Acute on Chronic Respiratory Failure COPD exacerbation  Sign and Symptoms:  Shortness of breath  Diagnostics: ABG 1/31 Nasal cannula 2/1 45% BILEVEL 2/1 35% Bilevel 2/1 45% Bilevel 2/2 35% Bilevel  pH 7.257 7.227 7.268 7.382 7.406  pCO2 86.9 97.6 83.8 61.3 59.5  pO2 72.7 105.0 63.5 59.6 72.1  HCO3 37.4 39.1 37.0 35..6 36.6    Treatment  Nebulizers BiPap Keep O2 sats greater than 92% Pulse Ox checks with vital signs  Thank You, Harless Littenebora T Jessica Seidman ,RN Clinical Documentation Specialist:  [phone]  [facility]- Health Information Management

## 2013-10-02 NOTE — Telephone Encounter (Signed)
Patient aware and appt scheduled for feb 11 with mmm

## 2013-10-02 NOTE — Telephone Encounter (Signed)
rx ready for pickup 

## 2013-10-03 ENCOUNTER — Ambulatory Visit: Payer: Medicare Other | Admitting: Nurse Practitioner

## 2013-10-03 ENCOUNTER — Other Ambulatory Visit: Payer: Self-pay | Admitting: Nurse Practitioner

## 2013-10-03 ENCOUNTER — Telehealth: Payer: Self-pay | Admitting: *Deleted

## 2013-10-03 DIAGNOSIS — E872 Acidosis: Secondary | ICD-10-CM | POA: Diagnosis not present

## 2013-10-03 DIAGNOSIS — J441 Chronic obstructive pulmonary disease with (acute) exacerbation: Secondary | ICD-10-CM | POA: Diagnosis not present

## 2013-10-03 LAB — BLOOD GAS, ARTERIAL
Acid-Base Excess: 12.9 mmol/L — ABNORMAL HIGH (ref 0.0–2.0)
Bicarbonate: 38.4 mEq/L — ABNORMAL HIGH (ref 20.0–24.0)
Drawn by: 38235
FIO2: 0.28 %
O2 SAT: 94.8 %
PCO2 ART: 64.2 mmHg — AB (ref 35.0–45.0)
PH ART: 7.394 (ref 7.350–7.450)
PO2 ART: 78 mmHg — AB (ref 80.0–100.0)
Patient temperature: 37
TCO2: 34.4 mmol/L (ref 0–100)

## 2013-10-03 LAB — GLUCOSE, CAPILLARY
GLUCOSE-CAPILLARY: 80 mg/dL (ref 70–99)
GLUCOSE-CAPILLARY: 89 mg/dL (ref 70–99)

## 2013-10-03 LAB — CBC
HCT: 45.1 % (ref 36.0–46.0)
Hemoglobin: 13.5 g/dL (ref 12.0–15.0)
MCH: 27.6 pg (ref 26.0–34.0)
MCHC: 29.9 g/dL — ABNORMAL LOW (ref 30.0–36.0)
MCV: 92.2 fL (ref 78.0–100.0)
Platelets: 183 10*3/uL (ref 150–400)
RBC: 4.89 MIL/uL (ref 3.87–5.11)
RDW: 18.7 % — ABNORMAL HIGH (ref 11.5–15.5)
WBC: 9.1 10*3/uL (ref 4.0–10.5)

## 2013-10-03 LAB — BASIC METABOLIC PANEL
BUN: 13 mg/dL (ref 6–23)
CO2: 40 meq/L — AB (ref 19–32)
Calcium: 8.7 mg/dL (ref 8.4–10.5)
Chloride: 97 mEq/L (ref 96–112)
Creatinine, Ser: 0.75 mg/dL (ref 0.50–1.10)
GFR calc Af Amer: >90 mL/min (ref >90)
GFR calc non Af Amer: >90 mL/min (ref >90)
GLUCOSE: 86 mg/dL (ref 70–99)
POTASSIUM: 3.6 meq/L — AB (ref 3.7–5.3)
SODIUM: 144 meq/L (ref 137–147)

## 2013-10-03 MED ORDER — ALBUTEROL SULFATE HFA 108 (90 BASE) MCG/ACT IN AERS: 2.0000 | INHALATION_SPRAY | Freq: Four times a day (QID) | RESPIRATORY_TRACT | Status: DC | PRN

## 2013-10-03 MED ORDER — PREDNISONE 10 MG PO TABS: ORAL_TABLET | ORAL | Status: DC

## 2013-10-03 NOTE — Telephone Encounter (Signed)
Mm, [facility] need another rx onalbuterol with  A diag code since they denied it with [facility] and it has to be sent [facility]. What a nightmare is the whole PA scene in 2015.

## 2013-10-03 NOTE — Progress Notes (Signed)
Pt's O2 at rest on RA is 94% with ambulation 91% on RA.

## 2013-10-03 NOTE — Progress Notes (Signed)
[facility] PROGRESS NOTE  Bethany Villa RUE:[id number] DOB: Dec 05, 1955 DOA: 09/29/2013 PCP: Rudi Heap, MD  Summary: 58 year old woman with chronic respiratory failure, COPD presented with complaints of shortness of breath, wheezing and cough for 2 weeks. Admitted for acute COPD exacerbation, metabolic encephalopathy and acute respiratory acidosis and treated with BiPAP. She was weaned off BiPAP, treated with nebulizers, steroids and antibiotics.  Assessment/Plan: 1. Acute on chronic respiratory failure with hypoxemia and hypercapnia. Now clinically compensated, chronic hypercarbia, hypercapnia at baseline with normal pH. She has all the requisite oxygen supplies at home including oxygen tank. She will continue on continuous oxygen 24/7 at 3 L for now. This can likely be weaned in the future. 2. COPD exacerbation. Clinically resolved. Complete steroid taper as an outpatient. 3. Metabolic encephalopathy secondary to uncompensated hypercapnia, acute respiratory acidosis. Resolved with treatment of above. 4. Chronic respiratory failure on home oxygen 3.5 L at night. 5. Chronic pain syndrome, fibromyalgia, degenerative joint disease. Stable. 6. Depression, anxiety 7. Tobacco dependence. Recommend cessation. She reports she quit smoking 3 weeks ago.   Discharge home  Check SpO2 on RA and document. May need to have home oxygen during the day for now.  Complete steroid taper as an outpatient.  Suggest establishing with outpatient pulmonology, patient would desire this in [place] area.  Give levaquin prior to d/c  Brendia Sacks, MD  [facility]  Pager [phone] If 7PM-7AM, please contact night-coverage at [email or web address], password [facility] 10/03/2013, 2:53 PM  LOS: 4 days   Consultants:  Physical therapy: No followup needed.  Procedures:    Antibiotics:  Levaquin 1/31 >> 2/4  HPI/Subjective: She feels much better. No complaints.  Objective: Filed Vitals:   10/02/13  1917 10/02/13 2130 10/03/13 0426 10/03/13 0733  BP:  117/77 110/75   Pulse:  100 88   Temp:  97.8 F (36.6 C) 98.6 F (37 C)   TempSrc:  Oral Oral   Resp:  20 20   Height:      Weight:      SpO2: 95% 96% 94% 95%    Intake/Output Summary (Last 24 hours) at 10/03/13 1453 Last data filed at 10/03/13 0800  Gross per 24 hour  Intake   1076 ml  Output      0 ml  Net   1076 ml     Filed Weights   09/30/13 0500 10/01/13 0500 10/01/13 1530  Weight: 101.5 kg (223 lb 12.3 oz) 102.2 kg (225 lb 5 oz) 102.4 kg (225 lb 12 oz)    Exam:   Afebrile, vital signs stable. 95% on 2 L.  General: Appears calm and comfortable. Speech fluent and clear.  Cardiovascular: Regular rate and rhythm. No murmur, rub or gallop.  Respiratory: Clear to auscultation bilaterally. No wheezes, rales or rhonchi. Fair air movement. Normal respiratory effort. Lying flat without difficulty.  Psychiatric grossly normal mood and affect. Speech fluent and appropriate.  Data Reviewed:  No hypoglycemia last 24 hours. Capillary blood sugars well controlled.  ABG noted, pH 7.34, PCO2 64, PO2 78. Review of her record demonstrates compensated hypercapnia with baseline 50-70 with normal pH in the past.  Basic metabolic panel unremarkable, chronic stable hypercarbia.CBC unremarkable.  Scheduled Meds: . albuterol  2.5 mg Nebulization BID  . albuterol  5 mg/hr Nebulization Once  . atorvastatin  20 mg Oral Daily  . buPROPion  150 mg Oral Daily  . docusate sodium  100 mg Oral BID  . enoxaparin (LOVENOX) injection  40 mg Subcutaneous Q24H  .  furosemide  40 mg Oral Daily  . gabapentin  300 mg Oral TID  . guaiFENesin  600 mg Oral BID  . insulin aspart  0-15 Units Subcutaneous TID WC  . levofloxacin  500 mg Oral QHS  . mometasone-formoterol  2 puff Inhalation BID  . nicotine  14 mg Transdermal Daily  . olopatadine  1 drop Both Eyes BID  . pantoprazole  40 mg Oral Daily  . predniSONE  40 mg Oral Q breakfast  .  tiotropium  18 mcg Inhalation Daily   Continuous Infusions:   Principal Problem:   COPD with acute exacerbation Active Problems:   Chronic pain disorder   Obesity   Metabolic encephalopathy   Acute-on-chronic respiratory failure   Acute exacerbation of chronic obstructive pulmonary disease (COPD)   Acute respiratory failure   Acute respiratory acidosis

## 2013-10-03 NOTE — Discharge Summary (Signed)
Physician Discharge Summary  Bethany Villa:[id number] DOB: 06-22-1956 DOA: 09/29/2013  PCP: Bethany Heap, MD  Admit date: 09/29/2013 Discharge date: 10/03/2013  Recommendations for Outpatient Follow-up:  1. Resolution of acute COPD exacerbation. 2. Continue to encourage abstinence from smoking. 3. Consider outpatient referral to pulmonology for chronic respiratory failure, COPD. 4. Patient will continue to followup pain clinic for her pain medications. No narcotics were prescribed.  5. Patient understands proper use of her long-acting, short acting, rescue inhalers. She stands Xopenex and albuterol should not be used together, both function as rescue inhalers.  Follow-up Information   Follow up with Bethany Heap, MD. Schedule an appointment as soon as possible for a visit in 1 week.   Specialty:  Family Medicine   Contact information:   [address] [place] [address] [phone]      Discharge Diagnoses:  1. Acute on chronic respiratory failure with hypoxemia and hypercapnia, chronic 2. COPD exacerbation 3. Metabolic encephalopathy  4. chronic respiratory failure on home oxygen 5. Chronic pain syndrome  Discharge Condition: Improved  Disposition: Home, continue home oxygen at night 3.5 L. No hypoxia during the day.  Diet recommendation: Regular  Filed Weights   09/30/13 0500 10/01/13 0500 10/01/13 1530  Weight: 101.5 kg (223 lb 12.3 oz) 102.2 kg (225 lb 5 oz) 102.4 kg (225 lb 12 oz)    History of present illness:  58 year old woman with chronic respiratory failure, COPD presented with complaints of shortness of breath, wheezing and cough for 2 weeks. Admitted for acute COPD exacerbation, metabolic encephalopathy and acute respiratory acidosis and treated with BiPAP.  Hospital Course:  [name] was treated for COPD exacerbation with nebulizers, antibiotics, steroids and oxygen supplementation/BiPAP. Her condition gradually improved with these modalities and she  is now stable for discharge. See individual is used below.  1. Acute on chronic respiratory failure with hypoxemia and hypercapnia. Now clinically compensated, chronic hypercarbia, hypercapnia at baseline with normal pH. She has all the requisite oxygen supplies at home including oxygen tank. Daytime hypoxia has resolved. 2. COPD exacerbation. Clinically resolved. Complete steroid taper as an outpatient. Complete antibiotics. 3. Metabolic encephalopathy secondary to uncompensated hypercapnia, acute respiratory acidosis. Resolved with treatment of above. 4. Chronic respiratory failure on home oxygen 3.5 L at night. Continue Spiriva, rescue inhalers, other medications as indicated. 5. Chronic pain syndrome, fibromyalgia, degenerative joint disease. Stable. 6. Depression, anxiety 7. Tobacco dependence. Recommend cessation. She reports she quit smoking 3 weeks ago. Suggest establishing with outpatient pulmonology, patient would desire this in [place] area.   Consultants:  Physical therapy: No followup needed. Procedures: none Antibiotics:  Levaquin 1/31 >> 2/4  Discharge Instructions  Discharge Orders   Future Appointments Provider Department Dept Phone   10/10/2013 10:45 AM Bethany Daphine Deutscher, FNP [facility] [phone]   Future Orders Complete By Expires   Diet general  As directed    Discharge instructions  As directed    Comments:     Call your physician or seek immediate medical attention for worsening shortness of breath, wheezing or worsening of her condition. Be sure to discuss proper use of your medications with your primary care physician. You are to be congratulated on having stopped smoking for the last 3 weeks.   Increase activity slowly  As directed        Medication List         albuterol (2.5 MG/3ML) 0.083% nebulizer solution  Commonly known as:  PROVENTIL  Take 3 mLs (2.5 mg  total) by nebulization every 6 (six) hours as needed for wheezing or  shortness of breath.     albuterol 108 (90 BASE) MCG/ACT inhaler  Commonly known as:  PROAIR HFA  Inhale 2 puffs into the lungs every 6 (six) hours as needed for wheezing or shortness of breath. Dx code 496     atorvastatin 20 MG tablet  Commonly known as:  LIPITOR  Take 1 tablet (20 mg total) by mouth daily.     buPROPion 150 MG 24 hr tablet  Commonly known as:  WELLBUTRIN XL  Take 150 mg by mouth daily.     cyclobenzaprine 10 MG tablet  Commonly known as:  FLEXERIL  Take 1 tablet (10 mg total) by mouth 3 (three) times daily as needed for muscle spasms.     diazepam 5 MG tablet  Commonly known as:  VALIUM  Take 1 tablet (5 mg total) by mouth every 8 (eight) hours as needed for anxiety.     fluticasone 50 MCG/ACT nasal spray  Commonly known as:  FLONASE  Place 2 sprays into the nose daily.     Fluticasone Furoate-Vilanterol 100-25 MCG/INH Aepb  Inhale 1 puff into the lungs daily.     furosemide 40 MG tablet  Commonly known as:  LASIX  Take 1 tablet (40 mg total) by mouth daily.     gabapentin 300 MG capsule  Commonly known as:  NEURONTIN  Take 300 mg by mouth 3 (three) times daily.     HYDROcodone-homatropine 5-1.5 MG/5ML syrup  Commonly known as:  HYCODAN  Take 5 mLs by mouth every 8 (eight) hours as needed for cough.     HYDROmorphone HCl 16 MG T24a  Take 16 mg by mouth daily.     ibuprofen 800 MG tablet  Commonly known as:  ADVIL,MOTRIN  TAKE 1 TABLET (800 MG TOTAL) BY MOUTH EVERY 6 (SIX) HOURS AS NEEDED FOR PAIN.     levalbuterol 45 MCG/ACT inhaler  Commonly known as:  XOPENEX HFA  Inhale 2 puffs into the lungs every 4 (four) hours as needed for wheezing or shortness of breath.     montelukast 10 MG tablet  Commonly known as:  SINGULAIR  Take 1 tablet (10 mg total) by mouth at bedtime.     olopatadine 0.1 % ophthalmic solution  Commonly known as:  PATANOL  Place 1 drop into both eyes 2 (two) times daily.     omeprazole 20 MG capsule  Commonly known as:   PRILOSEC  Take 1 capsule (20 mg total) by mouth daily.     predniSONE 10 MG tablet  Commonly known as:  DELTASONE  2/5-2/7: Take 40 mg by mouth daily. 2/8-2/10: Take 20 mg by mouth daily. 2/11-2/13: Take 10 mg by mouth daily then stop.     tiotropium 18 MCG inhalation capsule  Commonly known as:  SPIRIVA HANDIHALER  Place 1 capsule (18 mcg total) into inhaler and inhale daily.     traMADol 50 MG tablet  Commonly known as:  ULTRAM  Take 50 mg by mouth every 6 (six) hours as needed. pain       Allergies  Allergen Reactions  . Mobic [Meloxicam] Anaphylaxis  . Penicillins Shortness Of Breath and Swelling  . Sulfa Antibiotics Shortness Of Breath and Swelling    The results of significant diagnostics from this hospitalization (including imaging, microbiology, ancillary and laboratory) are listed below for reference.    Significant Diagnostic Studies: Dg Chest Port 1 View  09/29/2013  CLINICAL DATA:  Shortness of breath, weakness, cough, congestion, history asthma, COPD  EXAM: PORTABLE CHEST - 1 VIEW  COMPARISON:  Portable exam 1626 hr compared to 05/07/2013  FINDINGS: Enlargement of cardiac silhouette.  Mediastinal contours and pulmonary vascularity normal.  Lungs clear.  No definite pulmonary edema or segmental consolidation.  No pleural effusion or pneumothorax.  IMPRESSION: Enlargement of cardiac silhouette.  No acute abnormalities.   Electronically Signed   By: Ulyses Southward M.D.   On: 09/29/2013 16:37    Microbiology: Recent Results (from the past 240 hour(s))  MRSA PCR SCREENING     Status: None   Collection Time    09/29/13 10:02 PM      Result Value Range Status   MRSA by PCR NEGATIVE  NEGATIVE Final   Comment:            The GeneXpert MRSA Assay (FDA     approved for NASAL specimens     only), is one component of a     comprehensive MRSA colonization     surveillance program. It is not     intended to diagnose MRSA     infection nor to guide or     monitor treatment  for     MRSA infections.     Labs: Basic Metabolic Panel:  Recent Labs Lab 09/29/13 1636 09/30/13 0451 10/01/13 0512 10/02/13 0611 10/03/13 0601  NA 141 140 139 140 144  K 4.0 4.4 3.6* 4.3 3.6*  CL 95* 93* 94* 95* 97  CO2 39* 37* 36* 36* 40*  GLUCOSE 129* 126* 134* 115* 86  BUN 8 8 16 16 13   CREATININE 0.55 0.51 0.56 0.63 0.75  CALCIUM 9.2 9.1 8.8 8.6 8.7   Liver Function Tests:  Recent Labs Lab 09/30/13 0451  AST 11  ALT 12  ALKPHOS 117  BILITOT 0.6  PROT 7.2  ALBUMIN 3.0*   CBC:  Recent Labs Lab 09/29/13 1636 09/30/13 0451 10/03/13 0601  WBC 9.0 8.5 9.1  NEUTROABS 5.2  --   --   HGB 13.8 14.3 13.5  HCT 45.7 48.6* 45.1  MCV 92.3 94.6 92.2  PLT 198 214 183   Cardiac Enzymes:  Recent Labs Lab 09/29/13 1636  TROPONINI <0.30     Recent Labs  11/27/12 1534 05/05/13 1553  PROBNP 228.4* 3032.0*   CBG:  Recent Labs Lab 10/02/13 1229 10/02/13 1708 10/02/13 1809 10/02/13 2127 10/03/13 0736  GLUCAP 88 108* 105* 195* 80    Principal Problem:   COPD with acute exacerbation Active Problems:   Chronic pain disorder   Obesity   Metabolic encephalopathy   Acute-on-chronic respiratory failure   Acute exacerbation of chronic obstructive pulmonary disease (COPD)   Acute respiratory failure   Acute respiratory acidosis   Time coordinating discharge: 40 minutes  Signed:  Brendia Sacks, MD [facility] 10/03/2013, 3:46 PM

## 2013-10-03 NOTE — Progress Notes (Signed)
IV removed, site WNL.  Pt given d/c instructions and new prescriptions.  Discussed all home medications (when, how, and why to take), patient verbalizes understanding. Discussed home care with patient, teachback completed, pt knowledgeable about use of O2 and nebulizers. F/U appointment in place, pt states they will keep appointment. Pt is stable at this time. Pt taken to main entrance in wheelchair by staff member.

## 2013-10-10 ENCOUNTER — Encounter: Payer: Self-pay | Admitting: Nurse Practitioner

## 2013-10-10 ENCOUNTER — Ambulatory Visit (INDEPENDENT_AMBULATORY_CARE_PROVIDER_SITE_OTHER): Payer: Medicare Other | Admitting: Nurse Practitioner

## 2013-10-10 VITALS — BP 125/84 | HR 120 | Temp 98.6°F | Ht 62.5 in | Wt 226.0 lb

## 2013-10-10 DIAGNOSIS — Z01818 Encounter for other preprocedural examination: Secondary | ICD-10-CM

## 2013-10-10 DIAGNOSIS — B370 Candidal stomatitis: Secondary | ICD-10-CM | POA: Diagnosis not present

## 2013-10-10 DIAGNOSIS — Z09 Encounter for follow-up examination after completed treatment for conditions other than malignant neoplasm: Secondary | ICD-10-CM

## 2013-10-10 MED ORDER — NYSTATIN 100000 UNIT/ML MT SUSP: 5.0000 mL | Freq: Four times a day (QID) | OROMUCOSAL | Status: DC

## 2013-10-10 NOTE — Progress Notes (Signed)
   Subjective:    Patient ID: Bethany Villa, female    DOB: 01/03/1956, 58 y.o.   MRN: [id number]  HPI Patient was discharged from hospital last Wednesday- she was having breathing problems and was persumed to have the flu- She is here today for hospital follow up and surgical clearance- Is due to knee surgery on February 18,2015. Patient says she is doing much better- still has slight cough but that is normal for her. Some wheezing.    Review of Systems  Constitutional: Negative for fever and chills.  HENT: Negative.   Eyes: Negative.   Respiratory: Positive for cough and wheezing.   Cardiovascular: Negative.   Gastrointestinal: Negative.   Genitourinary: Negative.   Musculoskeletal: Negative.   All other systems reviewed and are negative.       Objective:   Physical Exam  Constitutional: She is oriented to person, place, and time. She appears well-developed and well-nourished.  HENT:  Right Ear: External ear normal.  Left Ear: External ear normal.  Nose: Nose normal.  Mouth/Throat: Oropharynx is clear and moist.  White film on tongue   Eyes: Conjunctivae are normal. Pupils are equal, round, and reactive to light.  Neck: Normal range of motion. Neck supple. No thyromegaly present.  Cardiovascular: Normal rate, regular rhythm and normal heart sounds.   Pulmonary/Chest: Effort normal. She has wheezes (faint exp lower bases.).  Abdominal: Soft. Bowel sounds are normal.  Neurological: She is alert and oriented to person, place, and time.  Skin: Skin is warm and dry.  Psychiatric: She has a normal mood and affect. Her behavior is normal. Judgment and thought content normal.   BP 125/84  Pulse 120  Temp(Src) 98.6 F (37 C) (Oral)  Ht 5' 2.5" (1.588 m)  Wt 226 lb (102.513 kg)  BMI 40.65 kg/m2        Assessment & Plan:   1. Thrush, oral   2. Hospital discharge follow-up   3. Preoperative clearance    Meds ordered this encounter  Medications  . nystatin  (MYCOSTATIN) 100000 UNIT/ML suspension    Sig: Take 5 mLs (500,000 Units total) by mouth 4 (four) times daily.    Dispense:  60 mL    Refill:  0    Order Specific Question:  Supervising Provider    Answer:  Deborra MedinaMOORE, DONALD W [[id number]]    Cleared for surgery Will probably need albuterol treatment prior to surgery Will need to repeat cbc prior to surgery Avoid being around anyone sick until surgery  [name], FNP

## 2013-10-10 NOTE — Patient Instructions (Signed)
Thrush, Adult  Thrush, also called oral candidiasis, is a fungal infection that develops in the mouth and throat and on the tongue. It causes white patches to form on the mouth and tongue. Thrush is most common in older adults, but it can occur at any age.  Many cases of thrush are mild, but this infection can also be more serious. Thrush can be a recurring problem for people who have chronic illnesses or who take medicines that limit the body's ability to fight infection. Because these people have difficulty fighting infections, the fungus that causes thrush can spread throughout the body. This can cause life-threatening blood or organ infections. CAUSES  Thrush is usually caused by a yeast called Candida albicans. This fungus is normally present in small amounts in the mouth and on other mucous membranes. It usually causes no harm. However, when conditions are present that allow the fungus to grow uncontrolled, it invades surrounding tissues and becomes an infection. Less often, other Candida species can also lead to thrush.  RISK FACTORS Thrush is more likely to develop in the following people:  People with an impaired ability to fight infection (weakened immune system).   Older adults.   People with HIV.   People with diabetes.   People with dry mouth (xerostomia).   Pregnant women.   People with poor dental care, especially those who have false teeth.   People who use antibiotic medicines.  SIGNS AND SYMPTOMS  Thrush can be a mild infection that causes no symptoms. If symptoms develop, they may include:   A burning feeling in the mouth and throat. This can occur at the start of a thrush infection.   White patches that adhere to the mouth and tongue. The tissue around the patches may be red, raw, and painful. If rubbed (during tooth brushing, for example), the patches and the tissue of the mouth may bleed easily.   A bad taste in the mouth or difficulty tasting foods.    Cottony feeling in the mouth.   Pain during eating and swallowing. DIAGNOSIS  Your health care provider can usually diagnose thrush by looking in your mouth and asking you questions about your health.  TREATMENT  Medicines that help prevent the growth of fungi (antifungals) are the standard treatment for thrush. These medicines are either applied directly to the affected area (topical) or swallowed (oral). The treatment will depend on the severity of the condition.  Mild Thrush Mild cases of thrush may clear up with the use of an antifungal mouth rinse or lozenges. Treatment usually lasts about 14 days.  Moderate to Severe Thrush  More severe thrush infections that have spread to the esophagus are treated with an oral antifungal medicine. A topical antifungal medicine may also be used.   For some severe infections, a treatment period longer than 14 days may be needed.   Oral antifungal medicines are almost never used during pregnancy because the fetus may be harmed. However, if a pregnant woman has a rare, severe thrush infection that has spread to her blood, oral antifungal medicines may be used. In this case, the risk of harm to the mother and fetus from the severe thrush infection may be greater than the risk posed by the use of antifungal medicines.  Persistent or Recurrent Thrush For cases of thrush that do not go away or keep coming back, treatment may involve the following:   Treatment may be needed twice as long as the symptoms last.   Treatment will   include both oral and topical antifungal medicines.   People with weakened immune systems can take an antifungal medicine on a continuous basis to prevent thrush infections.  It is important to treat conditions that make you more likely to get thrush, such as diabetes or HIV.  HOME CARE INSTRUCTIONS   Only take over-the-counter or prescription medicine as directed by your health care provider. Talk to your health care  provider about an over-the-counter medicine called gentian violet, which kills bacteria and fungi.   Eat plain, unflavored yogurt as directed by your health care provider. Check the label to make sure the yogurt contains live cultures. This yogurt can help healthy bacteria grow in the mouth that can stop the growth of the fungus that causes thrush.   Try these measures to help reduce the discomfort of thrush:   Drink cold liquids such as water or iced tea.   Try flavored ice treats or frozen juices.   Eat foods that are easy to swallow, such as gelatin, ice cream, or custard.   If the patches in your mouth are painful, try drinking from a straw.   Rinse your mouth several times a day with a warm saltwater rinse. You can make the saltwater mixture with 1 tsp (6 g) of salt in 8 fl oz (0.2 L) of warm water.   If you wear dentures, remove the dentures before going to bed, brush them vigorously, and soak them in a cleaning solution as directed by your health care provider.   Women who are breastfeeding should clean their nipples with an antifungal medicine as directed by their health care provider. Dry the nipples after breastfeeding. Applying lanolin-containing body lotion may help relieve nipple soreness.  SEEK MEDICAL CARE IF:  Your symptoms are getting worse or are not improving within 7 days of starting treatment.   You have symptoms of spreading infection, such as white patches on the skin outside of the mouth.   You are nursing and you have redness, burning, or pain in the nipples that is not relieved with treatment.  MAKE SURE YOU:  Understand these instructions.  Will watch your condition.  Will get help right away if you are not doing well or get worse. Document Released: 05/11/2004 Document Revised: 06/06/2013 Document Reviewed: 03/19/2013 [employer] Patient Information 2014 [employer].  

## 2013-10-11 ENCOUNTER — Telehealth: Payer: Self-pay | Admitting: Nurse Practitioner

## 2013-10-12 NOTE — Telephone Encounter (Signed)
Did she pass her

## 2013-10-14 NOTE — Telephone Encounter (Signed)
Note is in chart

## 2013-10-15 DIAGNOSIS — G894 Chronic pain syndrome: Secondary | ICD-10-CM | POA: Diagnosis not present

## 2013-10-15 NOTE — Telephone Encounter (Signed)
Clearance letter printed and faxed per request.

## 2013-10-22 ENCOUNTER — Emergency Department: Payer: Self-pay | Admitting: Emergency Medicine

## 2013-10-22 DIAGNOSIS — Z79899 Other long term (current) drug therapy: Secondary | ICD-10-CM | POA: Diagnosis not present

## 2013-10-22 DIAGNOSIS — J449 Chronic obstructive pulmonary disease, unspecified: Secondary | ICD-10-CM | POA: Diagnosis not present

## 2013-10-22 DIAGNOSIS — J45909 Unspecified asthma, uncomplicated: Secondary | ICD-10-CM | POA: Diagnosis not present

## 2013-10-22 DIAGNOSIS — R0902 Hypoxemia: Secondary | ICD-10-CM | POA: Diagnosis not present

## 2013-10-22 DIAGNOSIS — IMO0001 Reserved for inherently not codable concepts without codable children: Secondary | ICD-10-CM | POA: Diagnosis not present

## 2013-10-22 DIAGNOSIS — I509 Heart failure, unspecified: Secondary | ICD-10-CM | POA: Diagnosis not present

## 2013-10-22 DIAGNOSIS — F17200 Nicotine dependence, unspecified, uncomplicated: Secondary | ICD-10-CM | POA: Diagnosis not present

## 2013-10-22 LAB — COMPREHENSIVE METABOLIC PANEL
ALBUMIN: 2.9 g/dL — AB (ref 3.4–5.0)
ALT: 12 U/L (ref 12–78)
Alkaline Phosphatase: 103 U/L
Anion Gap: 3 — ABNORMAL LOW (ref 7–16)
BUN: 9 mg/dL (ref 7–18)
Bilirubin,Total: 0.3 mg/dL (ref 0.2–1.0)
CALCIUM: 9 mg/dL (ref 8.5–10.1)
Chloride: 98 mmol/L (ref 98–107)
Co2: 38 mmol/L — ABNORMAL HIGH (ref 21–32)
Creatinine: 0.85 mg/dL (ref 0.60–1.30)
EGFR (Non-African Amer.): >60
GLUCOSE: 146 mg/dL — AB (ref 65–99)
OSMOLALITY: 279 (ref 275–301)
Potassium: 3.9 mmol/L (ref 3.5–5.1)
SGOT(AST): 16 U/L (ref 15–37)
Sodium: 139 mmol/L (ref 136–145)
TOTAL PROTEIN: 6.9 g/dL (ref 6.4–8.2)

## 2013-10-22 LAB — CBC
HCT: 44.2 % (ref 35.0–47.0)
HGB: 13.9 g/dL (ref 12.0–16.0)
MCH: 28.1 pg (ref 26.0–34.0)
MCHC: 31.5 g/dL — ABNORMAL LOW (ref 32.0–36.0)
MCV: 89 fL (ref 80–100)
Platelet: 193 10*3/uL (ref 150–440)
RBC: 4.95 10*6/uL (ref 3.80–5.20)
RDW: 17.5 % — ABNORMAL HIGH (ref 11.5–14.5)
WBC: 8.8 10*3/uL (ref 3.6–11.0)

## 2013-11-12 DIAGNOSIS — M533 Sacrococcygeal disorders, not elsewhere classified: Secondary | ICD-10-CM | POA: Diagnosis not present

## 2013-11-12 DIAGNOSIS — G894 Chronic pain syndrome: Secondary | ICD-10-CM | POA: Diagnosis not present

## 2013-11-12 DIAGNOSIS — IMO0001 Reserved for inherently not codable concepts without codable children: Secondary | ICD-10-CM | POA: Diagnosis not present

## 2013-11-28 ENCOUNTER — Telehealth: Payer: Self-pay | Admitting: Nurse Practitioner

## 2013-11-28 MED ORDER — BENZONATATE 100 MG PO CAPS: 100.0000 mg | ORAL_CAPSULE | Freq: Two times a day (BID) | ORAL | Status: DC | PRN

## 2013-11-28 NOTE — Telephone Encounter (Signed)
Tessalon perles rx sent to pharmacy 

## 2013-11-29 ENCOUNTER — Telehealth: Payer: Self-pay | Admitting: Nurse Practitioner

## 2013-11-29 MED ORDER — DIAZEPAM 5 MG PO TABS: 5.0000 mg | ORAL_TABLET | Freq: Three times a day (TID) | ORAL | Status: DC | PRN

## 2013-11-29 NOTE — Telephone Encounter (Signed)
Please call in valium 0.5 1 po TID #[id number]  with 0 refills. Will have to wait until i get back to change allergy meds

## 2013-12-03 NOTE — Telephone Encounter (Signed)
Called in.

## 2013-12-10 ENCOUNTER — Telehealth: Payer: Self-pay | Admitting: Nurse Practitioner

## 2013-12-11 NOTE — Telephone Encounter (Signed)
NTBS p

## 2013-12-11 NOTE — Telephone Encounter (Signed)
Patient aware NTBS 

## 2014-01-01 ENCOUNTER — Telehealth: Payer: Self-pay | Admitting: Nurse Practitioner

## 2014-01-01 MED ORDER — DIAZEPAM 5 MG PO TABS: 5.0000 mg | ORAL_TABLET | Freq: Three times a day (TID) | ORAL | Status: DC | PRN

## 2014-01-01 MED ORDER — CYCLOBENZAPRINE HCL 10 MG PO TABS: ORAL_TABLET | ORAL | Status: DC

## 2014-01-01 NOTE — Telephone Encounter (Signed)
rx sent to pharmacy

## 2014-01-09 DIAGNOSIS — Z5181 Encounter for therapeutic drug level monitoring: Secondary | ICD-10-CM | POA: Diagnosis not present

## 2014-01-09 DIAGNOSIS — Z79899 Other long term (current) drug therapy: Secondary | ICD-10-CM | POA: Diagnosis not present

## 2014-01-09 DIAGNOSIS — IMO0001 Reserved for inherently not codable concepts without codable children: Secondary | ICD-10-CM | POA: Diagnosis not present

## 2014-01-09 DIAGNOSIS — M533 Sacrococcygeal disorders, not elsewhere classified: Secondary | ICD-10-CM | POA: Diagnosis not present

## 2014-01-09 DIAGNOSIS — G894 Chronic pain syndrome: Secondary | ICD-10-CM | POA: Diagnosis not present

## 2014-01-09 NOTE — Telephone Encounter (Signed)
Called in.

## 2014-02-08 ENCOUNTER — Telehealth: Payer: Self-pay | Admitting: Nurse Practitioner

## 2014-02-09 ENCOUNTER — Other Ambulatory Visit: Payer: Self-pay | Admitting: Nurse Practitioner

## 2014-02-09 ENCOUNTER — Telehealth: Payer: Self-pay | Admitting: Nurse Practitioner

## 2014-02-11 MED ORDER — DIAZEPAM 5 MG PO TABS: 5.0000 mg | ORAL_TABLET | Freq: Three times a day (TID) | ORAL | Status: DC | PRN

## 2014-02-11 NOTE — Telephone Encounter (Signed)
Duplicate encounter

## 2014-02-11 NOTE — Telephone Encounter (Signed)
Please call in valium 5 mg 1 po TID #90  with 0 refills  

## 2014-02-12 ENCOUNTER — Other Ambulatory Visit: Payer: Self-pay | Admitting: Nurse Practitioner

## 2014-02-12 NOTE — Telephone Encounter (Signed)
Was already done on 02/11/14

## 2014-02-12 NOTE — Telephone Encounter (Signed)
RX of Valium Called to [facility]. Pt notified

## 2014-02-28 ENCOUNTER — Other Ambulatory Visit: Payer: Self-pay | Admitting: Family Medicine

## 2014-02-28 ENCOUNTER — Other Ambulatory Visit: Payer: Self-pay | Admitting: Nurse Practitioner

## 2014-03-04 NOTE — Telephone Encounter (Signed)
Last seen 10/10/13  MMM  Requesting 90 day supply

## 2014-03-04 NOTE — Telephone Encounter (Signed)
Last seen 10/10/13  MMM

## 2014-03-08 ENCOUNTER — Other Ambulatory Visit: Payer: Self-pay | Admitting: Nurse Practitioner

## 2014-03-09 MED ORDER — DIAZEPAM 5 MG PO TABS: ORAL_TABLET | ORAL | Status: DC

## 2014-03-09 NOTE — Telephone Encounter (Addendum)
Please call in valium with 1 refills 

## 2014-03-09 NOTE — Telephone Encounter (Signed)
rx called into pharmacy

## 2014-03-15 ENCOUNTER — Other Ambulatory Visit: Payer: Self-pay | Admitting: Nurse Practitioner

## 2014-03-15 NOTE — Telephone Encounter (Signed)
Patient given these on 03-04-14 for #20. Please advise on refill

## 2014-03-25 DIAGNOSIS — G894 Chronic pain syndrome: Secondary | ICD-10-CM | POA: Diagnosis not present

## 2014-04-01 ENCOUNTER — Other Ambulatory Visit: Payer: Self-pay | Admitting: Nurse Practitioner

## 2014-04-05 ENCOUNTER — Telehealth: Payer: Self-pay | Admitting: Family Medicine

## 2014-04-05 NOTE — Telephone Encounter (Signed)
Try delsym or robitussin OTC an dmelatonin for sleep- needs appointment if wants rx for sleep.

## 2014-04-06 NOTE — Telephone Encounter (Signed)
Patient aware left message  

## 2014-04-18 IMAGING — CR DG CHEST 1V PORT
1 series · 1 of 1 positions shown · non-contrast
Comparison: 11/28/2012

CLINICAL DATA: Respiratory distress.  Ventilator

PORTABLE CHEST - 1 VIEW

[view not recorded]
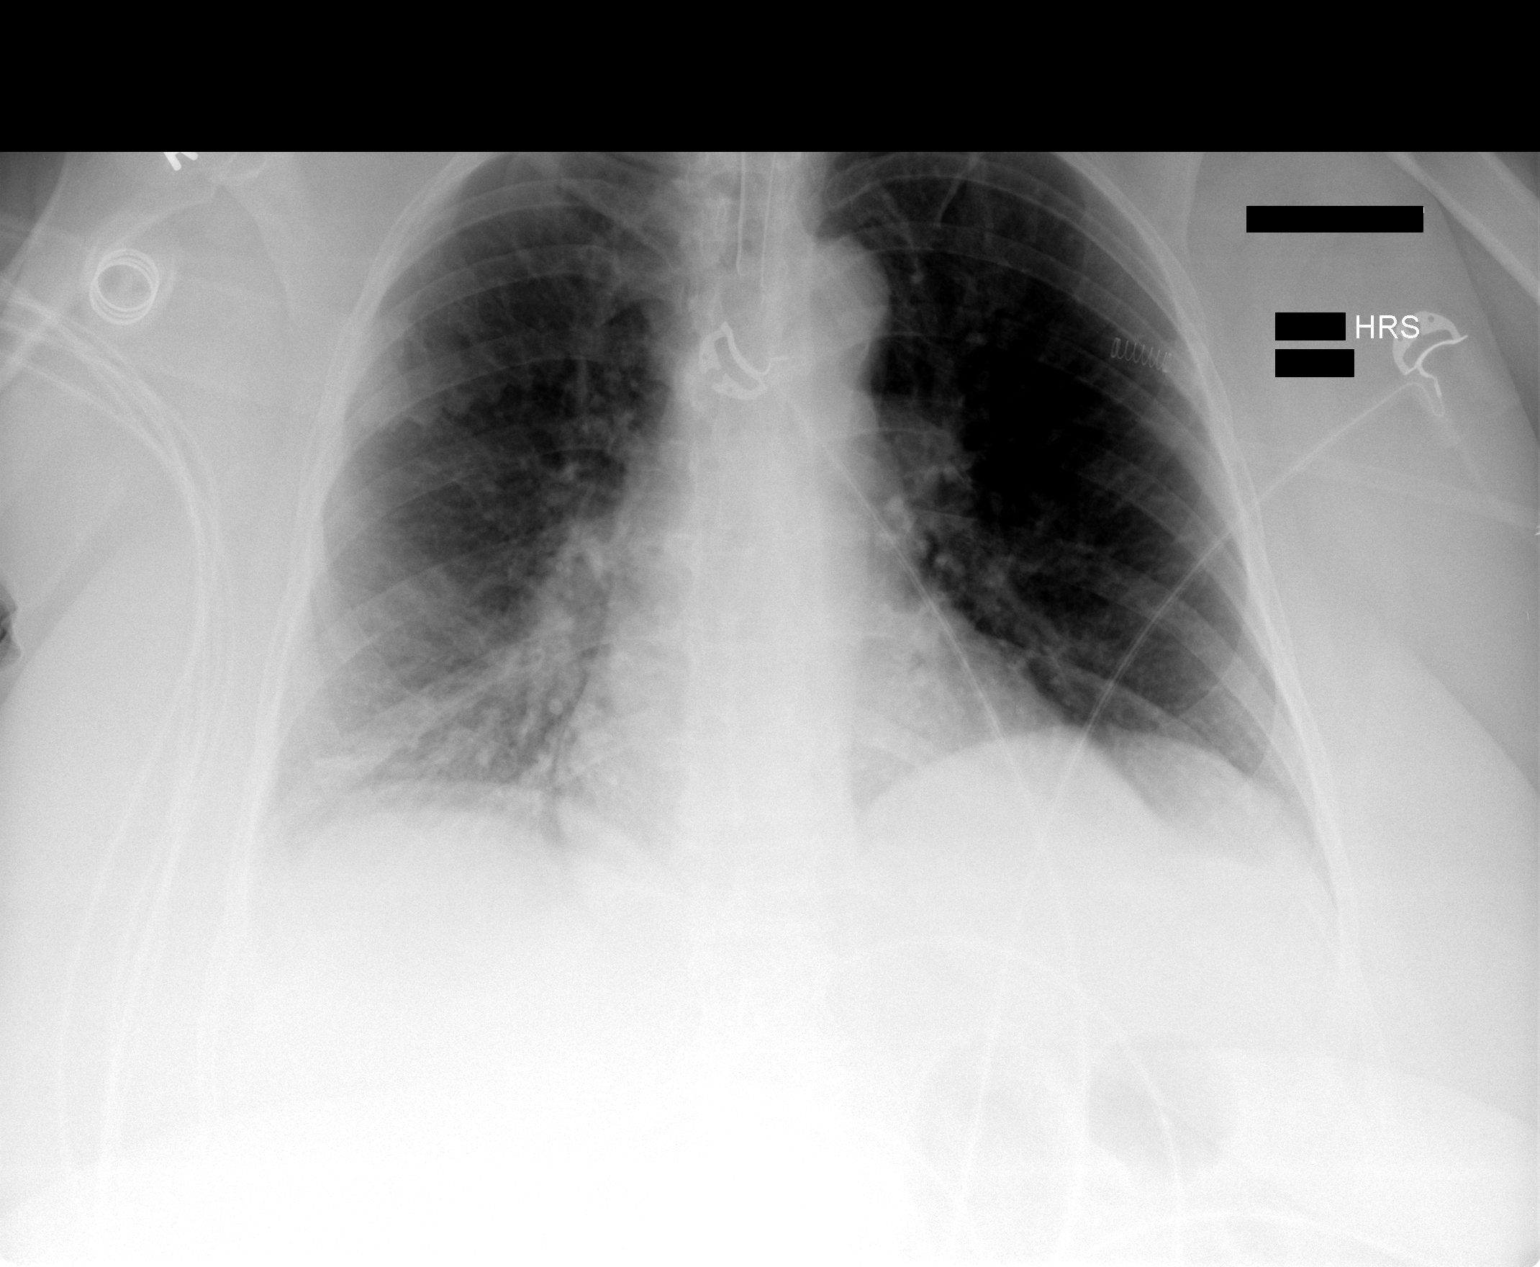

[1 of 1 positions shown; findings below may reference images not displayed]

FINDINGS: Endotracheal tube in good position.  NG tube has been
pulled back and is now at the level of the aortic arch.  This
should be advanced into the stomach.

Mild progression of right lower lobe airspace disease.

Negative for heart failure.    No significant effusion.  Underlying
COPD.
IMPRESSION: NG tube has been pulled back and the tip is now at the level of the
aortic arch.  This should be advanced into the stomach.

Endotracheal tube in good position.

Mild progression of right lower lobe atelectasis/infiltrate.

## 2014-04-19 IMAGING — CR DG CHEST 1V PORT
1 series · 1 of 1 positions shown · non-contrast
Comparison: Portable chest x-ray of 11/29/2012

CLINICAL DATA: Respiratory failure, on ventilator

PORTABLE CHEST - 1 VIEW

[view not recorded]
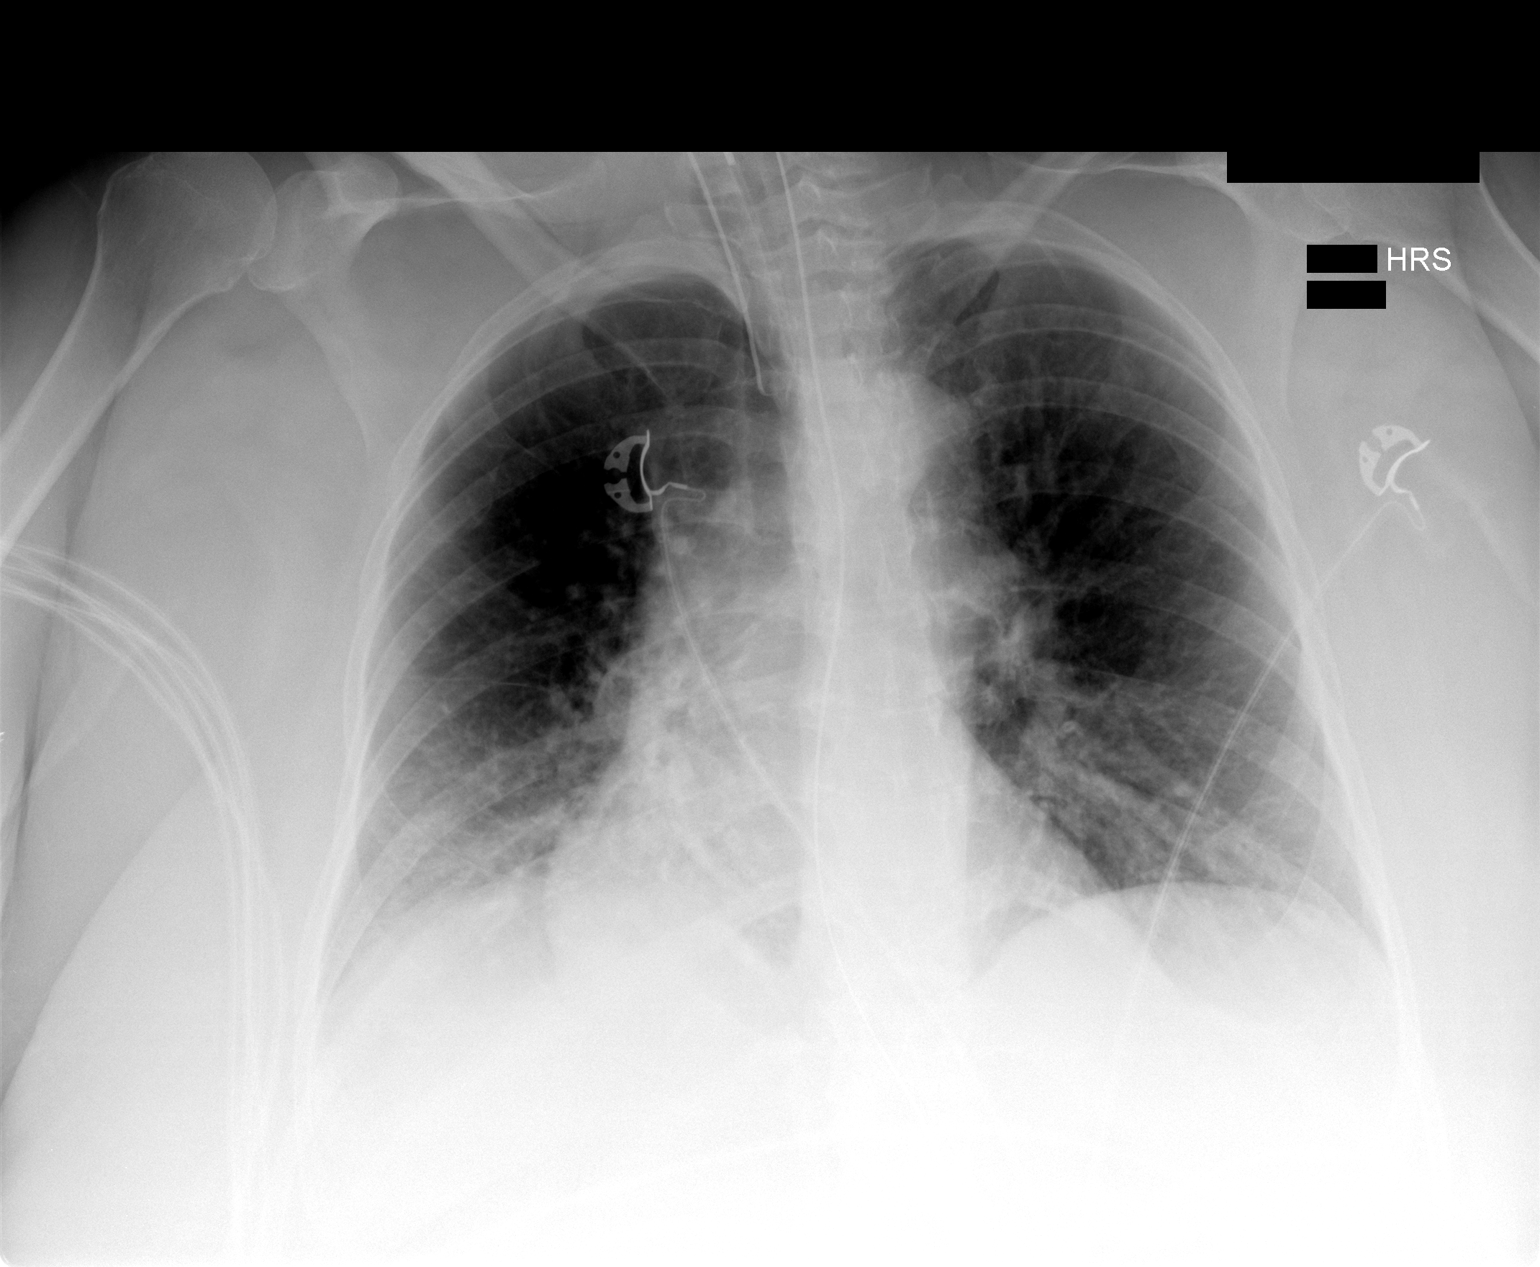

[1 of 1 positions shown; findings below may reference images not displayed]

FINDINGS: The tip of the endotracheal tube is approximately 4.4 cm
above the carina.  Bibasilar atelectasis remains.  Mild
cardiomegaly is stable.  No bony abnormality is noted.
IMPRESSION: Endotracheal tip 4.4 cm above the carina.  Mild bibasilar
atelectasis remains.

## 2014-04-22 ENCOUNTER — Telehealth: Payer: Self-pay | Admitting: Nurse Practitioner

## 2014-04-22 MED ORDER — BENZONATATE 100 MG PO CAPS: 100.0000 mg | ORAL_CAPSULE | Freq: Two times a day (BID) | ORAL | Status: DC | PRN

## 2014-04-22 NOTE — Telephone Encounter (Signed)
I sent the Rx to the pharmacy.

## 2014-04-23 ENCOUNTER — Telehealth: Payer: Self-pay | Admitting: Nurse Practitioner

## 2014-04-23 DIAGNOSIS — Z01818 Encounter for other preprocedural examination: Secondary | ICD-10-CM

## 2014-04-23 NOTE — Telephone Encounter (Signed)
Referral done

## 2014-04-24 NOTE — Telephone Encounter (Signed)
Pt aware and appt will be in [place].

## 2014-05-06 ENCOUNTER — Other Ambulatory Visit: Payer: Self-pay | Admitting: Family Medicine

## 2014-05-07 NOTE — Telephone Encounter (Signed)
Last ov 2/15. 

## 2014-05-13 ENCOUNTER — Other Ambulatory Visit: Payer: Self-pay | Admitting: Nurse Practitioner

## 2014-05-13 DIAGNOSIS — Z791 Long term (current) use of non-steroidal anti-inflammatories (NSAID): Secondary | ICD-10-CM | POA: Diagnosis not present

## 2014-05-13 DIAGNOSIS — Z888 Allergy status to other drugs, medicaments and biological substances status: Secondary | ICD-10-CM | POA: Diagnosis not present

## 2014-05-13 DIAGNOSIS — Z79899 Other long term (current) drug therapy: Secondary | ICD-10-CM | POA: Diagnosis not present

## 2014-05-13 DIAGNOSIS — IMO0001 Reserved for inherently not codable concepts without codable children: Secondary | ICD-10-CM | POA: Diagnosis not present

## 2014-05-13 DIAGNOSIS — M533 Sacrococcygeal disorders, not elsewhere classified: Secondary | ICD-10-CM | POA: Diagnosis not present

## 2014-05-13 DIAGNOSIS — Z880 Allergy status to penicillin: Secondary | ICD-10-CM | POA: Diagnosis not present

## 2014-05-13 DIAGNOSIS — Z886 Allergy status to analgesic agent status: Secondary | ICD-10-CM | POA: Diagnosis not present

## 2014-05-13 DIAGNOSIS — IMO0002 Reserved for concepts with insufficient information to code with codable children: Secondary | ICD-10-CM | POA: Diagnosis not present

## 2014-05-13 DIAGNOSIS — Z882 Allergy status to sulfonamides status: Secondary | ICD-10-CM | POA: Diagnosis not present

## 2014-05-13 DIAGNOSIS — G894 Chronic pain syndrome: Secondary | ICD-10-CM | POA: Diagnosis not present

## 2014-05-13 DIAGNOSIS — M47817 Spondylosis without myelopathy or radiculopathy, lumbosacral region: Secondary | ICD-10-CM | POA: Diagnosis not present

## 2014-05-14 NOTE — Telephone Encounter (Signed)
Last ov 2/15. 

## 2014-05-17 ENCOUNTER — Institutional Professional Consult (permissible substitution): Payer: Medicare Other | Admitting: Emergency Medicine

## 2014-05-23 ENCOUNTER — Institutional Professional Consult (permissible substitution): Payer: Medicare Other | Admitting: Emergency Medicine

## 2014-05-24 ENCOUNTER — Ambulatory Visit: Payer: Medicare Other | Admitting: Nurse Practitioner

## 2014-05-30 ENCOUNTER — Encounter: Payer: Self-pay | Admitting: Emergency Medicine

## 2014-06-03 ENCOUNTER — Other Ambulatory Visit: Payer: Self-pay | Admitting: Nurse Practitioner

## 2014-06-05 NOTE — Telephone Encounter (Signed)
Please call in valium with o refills no more refills without being seen

## 2014-06-05 NOTE — Telephone Encounter (Signed)
Patient last seen in office on 10-10-13. Rx last filled on 04-23-14 for #90. Please advise.  If approved please route to pool B so nurse can phone in to pharmacy

## 2014-06-05 NOTE — Telephone Encounter (Signed)
[facility] & message left on pt's voicemail NTBS before anymore refills

## 2014-06-10 ENCOUNTER — Other Ambulatory Visit: Payer: Self-pay | Admitting: Nurse Practitioner

## 2014-06-10 DIAGNOSIS — G894 Chronic pain syndrome: Secondary | ICD-10-CM | POA: Diagnosis not present

## 2014-07-01 ENCOUNTER — Other Ambulatory Visit: Payer: Self-pay | Admitting: Family Medicine

## 2014-07-01 ENCOUNTER — Other Ambulatory Visit: Payer: Self-pay | Admitting: Nurse Practitioner

## 2014-07-01 ENCOUNTER — Encounter: Payer: Self-pay | Admitting: Nurse Practitioner

## 2014-07-03 ENCOUNTER — Other Ambulatory Visit: Payer: Self-pay | Admitting: Nurse Practitioner

## 2014-07-03 NOTE — Telephone Encounter (Signed)
Valium last filled 06/05/14. Pt not seen since 09/2013. [facility] uses [facility]

## 2014-07-03 NOTE — Telephone Encounter (Signed)
Please call in valium 5mg  1 po TID #90  with 1 refills

## 2014-07-03 NOTE — Telephone Encounter (Signed)
Called in.

## 2014-07-05 ENCOUNTER — Other Ambulatory Visit: Payer: Self-pay | Admitting: *Deleted

## 2014-07-05 MED ORDER — FLUTICASONE FUROATE-VILANTEROL 100-25 MCG/INH IN AEPB: 1.0000 | INHALATION_SPRAY | Freq: Every day | RESPIRATORY_TRACT | Status: DC

## 2014-07-05 NOTE — Telephone Encounter (Signed)
Patient last seen in office on 10-10-13. Please advise on refill

## 2014-07-08 ENCOUNTER — Other Ambulatory Visit: Payer: Self-pay

## 2014-07-08 MED ORDER — FLUTICASONE FUROATE-VILANTEROL 100-25 MCG/INH IN AEPB: 1.0000 | INHALATION_SPRAY | Freq: Every day | RESPIRATORY_TRACT | Status: DC

## 2014-07-23 ENCOUNTER — Telehealth: Payer: Self-pay | Admitting: Nurse Practitioner

## 2014-07-23 MED ORDER — BENZONATATE 100 MG PO CAPS: ORAL_CAPSULE | ORAL | Status: DC

## 2014-07-23 NOTE — Telephone Encounter (Signed)
Tessalon perles rx sent to pharmacy 

## 2014-07-24 ENCOUNTER — Emergency Department (HOSPITAL_COMMUNITY): Payer: Medicare Other

## 2014-07-24 ENCOUNTER — Inpatient Hospital Stay (HOSPITAL_COMMUNITY)
Admission: EM | Admit: 2014-07-24 | Discharge: 2014-07-28 | DRG: 208 | Disposition: A | Discharge status: Home / Self Care | Payer: Medicare Other | Attending: Family Medicine | Admitting: Family Medicine

## 2014-07-24 ENCOUNTER — Encounter (HOSPITAL_COMMUNITY): Payer: Self-pay | Admitting: Emergency Medicine

## 2014-07-24 DIAGNOSIS — J45909 Unspecified asthma, uncomplicated: Secondary | ICD-10-CM | POA: Diagnosis present

## 2014-07-24 DIAGNOSIS — E785 Hyperlipidemia, unspecified: Secondary | ICD-10-CM | POA: Diagnosis present

## 2014-07-24 DIAGNOSIS — Z882 Allergy status to sulfonamides status: Secondary | ICD-10-CM

## 2014-07-24 DIAGNOSIS — F410 Panic disorder [episodic paroxysmal anxiety] without agoraphobia: Secondary | ICD-10-CM | POA: Diagnosis present

## 2014-07-24 DIAGNOSIS — F329 Major depressive disorder, single episode, unspecified: Secondary | ICD-10-CM | POA: Diagnosis not present

## 2014-07-24 DIAGNOSIS — F32A Depression, unspecified: Secondary | ICD-10-CM | POA: Diagnosis present

## 2014-07-24 DIAGNOSIS — R4182 Altered mental status, unspecified: Secondary | ICD-10-CM | POA: Diagnosis present

## 2014-07-24 DIAGNOSIS — F419 Anxiety disorder, unspecified: Secondary | ICD-10-CM | POA: Diagnosis present

## 2014-07-24 DIAGNOSIS — R680 Hypothermia, not associated with low environmental temperature: Secondary | ICD-10-CM | POA: Diagnosis present

## 2014-07-24 DIAGNOSIS — J9690 Respiratory failure, unspecified, unspecified whether with hypoxia or hypercapnia: Secondary | ICD-10-CM | POA: Diagnosis present

## 2014-07-24 DIAGNOSIS — J9621 Acute and chronic respiratory failure with hypoxia: Secondary | ICD-10-CM

## 2014-07-24 DIAGNOSIS — T68XXXA Hypothermia, initial encounter: Secondary | ICD-10-CM | POA: Diagnosis not present

## 2014-07-24 DIAGNOSIS — M1990 Unspecified osteoarthritis, unspecified site: Secondary | ICD-10-CM | POA: Diagnosis present

## 2014-07-24 DIAGNOSIS — Z7952 Long term (current) use of systemic steroids: Secondary | ICD-10-CM

## 2014-07-24 DIAGNOSIS — A419 Sepsis, unspecified organism: Secondary | ICD-10-CM | POA: Diagnosis not present

## 2014-07-24 DIAGNOSIS — G894 Chronic pain syndrome: Secondary | ICD-10-CM | POA: Diagnosis present

## 2014-07-24 DIAGNOSIS — G479 Sleep disorder, unspecified: Secondary | ICD-10-CM | POA: Diagnosis present

## 2014-07-24 DIAGNOSIS — Z880 Allergy status to penicillin: Secondary | ICD-10-CM

## 2014-07-24 DIAGNOSIS — Z888 Allergy status to other drugs, medicaments and biological substances status: Secondary | ICD-10-CM | POA: Diagnosis not present

## 2014-07-24 DIAGNOSIS — R6510 Systemic inflammatory response syndrome (SIRS) of non-infectious origin without acute organ dysfunction: Secondary | ICD-10-CM

## 2014-07-24 DIAGNOSIS — Z79891 Long term (current) use of opiate analgesic: Secondary | ICD-10-CM

## 2014-07-24 DIAGNOSIS — J9622 Acute and chronic respiratory failure with hypercapnia: Principal | ICD-10-CM | POA: Diagnosis present

## 2014-07-24 DIAGNOSIS — G8929 Other chronic pain: Secondary | ICD-10-CM | POA: Diagnosis present

## 2014-07-24 DIAGNOSIS — J449 Chronic obstructive pulmonary disease, unspecified: Secondary | ICD-10-CM | POA: Diagnosis present

## 2014-07-24 DIAGNOSIS — J441 Chronic obstructive pulmonary disease with (acute) exacerbation: Secondary | ICD-10-CM | POA: Diagnosis present

## 2014-07-24 DIAGNOSIS — J9602 Acute respiratory failure with hypercapnia: Secondary | ICD-10-CM | POA: Diagnosis not present

## 2014-07-24 DIAGNOSIS — M797 Fibromyalgia: Secondary | ICD-10-CM | POA: Diagnosis not present

## 2014-07-24 DIAGNOSIS — Z23 Encounter for immunization: Secondary | ICD-10-CM

## 2014-07-24 DIAGNOSIS — Z87891 Personal history of nicotine dependence: Secondary | ICD-10-CM

## 2014-07-24 DIAGNOSIS — Z22322 Carrier or suspected carrier of Methicillin resistant Staphylococcus aureus: Secondary | ICD-10-CM | POA: Diagnosis not present

## 2014-07-24 DIAGNOSIS — Z9981 Dependence on supplemental oxygen: Secondary | ICD-10-CM | POA: Diagnosis not present

## 2014-07-24 DIAGNOSIS — R6511 Systemic inflammatory response syndrome (SIRS) of non-infectious origin with acute organ dysfunction: Secondary | ICD-10-CM | POA: Diagnosis present

## 2014-07-24 DIAGNOSIS — K219 Gastro-esophageal reflux disease without esophagitis: Secondary | ICD-10-CM | POA: Diagnosis present

## 2014-07-24 DIAGNOSIS — J9620 Acute and chronic respiratory failure, unspecified whether with hypoxia or hypercapnia: Secondary | ICD-10-CM | POA: Diagnosis not present

## 2014-07-24 DIAGNOSIS — Z4682 Encounter for fitting and adjustment of non-vascular catheter: Secondary | ICD-10-CM | POA: Diagnosis not present

## 2014-07-24 DIAGNOSIS — R404 Transient alteration of awareness: Secondary | ICD-10-CM

## 2014-07-24 LAB — CBC WITH DIFFERENTIAL/PLATELET
BASOS PCT: 0 % (ref 0–1)
Basophils Absolute: 0 10*3/uL (ref 0.0–0.1)
EOS ABS: 0.2 10*3/uL (ref 0.0–0.7)
Eosinophils Relative: 2 % (ref 0–5)
HCT: 46.8 % — ABNORMAL HIGH (ref 36.0–46.0)
Hemoglobin: 14.4 g/dL (ref 12.0–15.0)
Lymphocytes Relative: 21 % (ref 12–46)
Lymphs Abs: 2.5 10*3/uL (ref 0.7–4.0)
MCH: 29.9 pg (ref 26.0–34.0)
MCHC: 29.7 g/dL — AB (ref 30.0–36.0)
MCV: 96.5 fL (ref 78.0–100.0)
Monocytes Absolute: 0.7 10*3/uL (ref 0.1–1.0)
Monocytes Relative: 6 % (ref 3–12)
Neutro Abs: 8.6 10*3/uL — ABNORMAL HIGH (ref 1.7–7.7)
Neutrophils Relative %: 71 % (ref 43–77)
PLATELETS: 245 10*3/uL (ref 150–400)
RBC: 4.85 MIL/uL (ref 3.87–5.11)
RDW: 16.8 % — AB (ref 11.5–15.5)
WBC: 12.1 10*3/uL — ABNORMAL HIGH (ref 4.0–10.5)

## 2014-07-24 LAB — RAPID URINE DRUG SCREEN, HOSP PERFORMED
Amphetamines: NOT DETECTED
Barbiturates: NOT DETECTED
Benzodiazepines: POSITIVE — AB
COCAINE: NOT DETECTED
Opiates: POSITIVE — AB
TETRAHYDROCANNABINOL: NOT DETECTED

## 2014-07-24 LAB — URINALYSIS, ROUTINE W REFLEX MICROSCOPIC
Glucose, UA: NEGATIVE mg/dL
HGB URINE DIPSTICK: NEGATIVE
Ketones, ur: NEGATIVE mg/dL
Leukocytes, UA: NEGATIVE
Nitrite: NEGATIVE
Protein, ur: 30 mg/dL — AB
SPECIFIC GRAVITY, URINE: 1.025 (ref 1.005–1.030)
Urobilinogen, UA: 0.2 mg/dL (ref 0.0–1.0)
pH: 6 (ref 5.0–8.0)

## 2014-07-24 LAB — BLOOD GAS, ARTERIAL
ACID-BASE DEFICIT: 1.1 mmol/L (ref 0.0–2.0)
Acid-Base Excess: 0.2 mmol/L (ref 0.0–2.0)
Acid-base deficit: 1.7 mmol/L (ref 0.0–2.0)
BICARBONATE: 28.1 meq/L — AB (ref 20.0–24.0)
BICARBONATE: 25.5 meq/L — AB (ref 20.0–24.0)
Bicarbonate: 24.9 mEq/L — ABNORMAL HIGH (ref 20.0–24.0)
Drawn by: 38235
Drawn by: 234301
Drawn by: 234301
FIO2: 0.6 %
FIO2: 50 %
FIO2: 50 %
MECHVT: 500 mL
MECHVT: 500 mL
MECHVT: 520 mL
O2 SAT: 98 %
O2 SAT: 95.3 %
O2 Saturation: 95.1 %
PEEP: 5 cmH2O
PEEP: 5 cmH2O
PEEP: 5 cmH2O
PH ART: 7.199 — AB (ref 7.350–7.450)
Patient temperature: 37
Patient temperature: 37
Patient temperature: 37
RATE: 15 resp/min
RATE: 18 resp/min
RATE: 20 resp/min
TCO2: 27.2 mmol/L (ref 0–100)
TCO2: 23.3 mmol/L (ref 0–100)
TCO2: 22 mmol/L (ref 0–100)
pCO2 arterial: 99.2 mmHg (ref 35.0–45.0)
pCO2 arterial: 68 mmHg (ref 35.0–45.0)
pCO2 arterial: 44.5 mmHg (ref 35.0–45.0)
pH, Arterial: 7.081 — CL (ref 7.350–7.450)
pH, Arterial: 7.366 (ref 7.350–7.450)
pO2, Arterial: 196 mmHg — ABNORMAL HIGH (ref 80.0–100.0)
pO2, Arterial: 89.2 mmHg (ref 80.0–100.0)
pO2, Arterial: 73.8 mmHg — ABNORMAL LOW (ref 80.0–100.0)

## 2014-07-24 LAB — COMPREHENSIVE METABOLIC PANEL
ALBUMIN: 3.2 g/dL — AB (ref 3.5–5.2)
ALT: 123 U/L — AB (ref 0–35)
ANION GAP: 11 (ref 5–15)
AST: 117 U/L — ABNORMAL HIGH (ref 0–37)
Alkaline Phosphatase: 117 U/L (ref 39–117)
BUN: 14 mg/dL (ref 6–23)
CO2: 29 mEq/L (ref 19–32)
Calcium: 8 mg/dL — ABNORMAL LOW (ref 8.4–10.5)
Chloride: 102 mEq/L (ref 96–112)
Creatinine, Ser: 1.26 mg/dL — ABNORMAL HIGH (ref 0.50–1.10)
GFR calc Af Amer: 54 mL/min — ABNORMAL LOW (ref >90)
GFR calc non Af Amer: 46 mL/min — ABNORMAL LOW (ref >90)
Glucose, Bld: 102 mg/dL — ABNORMAL HIGH (ref 70–99)
POTASSIUM: 3.5 meq/L — AB (ref 3.7–5.3)
SODIUM: 142 meq/L (ref 137–147)
TOTAL PROTEIN: 6.7 g/dL (ref 6.0–8.3)
Total Bilirubin: 0.4 mg/dL (ref 0.3–1.2)

## 2014-07-24 LAB — ETHANOL: Alcohol, Ethyl (B): <11 mg/dL (ref 0–11)

## 2014-07-24 LAB — URINE MICROSCOPIC-ADD ON

## 2014-07-24 LAB — ACETAMINOPHEN LEVEL

## 2014-07-24 LAB — TROPONIN I: Troponin I: <0.3 ng/mL (ref <0.30)

## 2014-07-24 LAB — MRSA PCR SCREENING: MRSA BY PCR: POSITIVE — AB

## 2014-07-24 LAB — SALICYLATE LEVEL: Salicylate Lvl: <2 mg/dL — ABNORMAL LOW (ref 2.8–20.0)

## 2014-07-24 LAB — PHOSPHORUS: Phosphorus: 6.9 mg/dL — ABNORMAL HIGH (ref 2.3–4.6)

## 2014-07-24 LAB — MAGNESIUM: MAGNESIUM: 1.7 mg/dL (ref 1.5–2.5)

## 2014-07-24 LAB — I-STAT CG4 LACTIC ACID, ED: LACTIC ACID, VENOUS: 1.43 mmol/L (ref 0.5–2.2)

## 2014-07-24 MED ORDER — FENTANYL CITRATE 0.05 MG/ML IJ SOLN
100.0000 ug | INTRAMUSCULAR | Status: DC | PRN
  Administered 2014-07-25 – 2014-07-26 (×5): 100 ug via INTRAVENOUS
  Filled 2014-07-24 (×5): qty 2

## 2014-07-24 MED ORDER — LIDOCAINE HCL (CARDIAC) 20 MG/ML IV SOLN
INTRAVENOUS | Status: AC
  Filled 2014-07-24: qty 5

## 2014-07-24 MED ORDER — MUPIROCIN 2 % EX OINT
1.0000 "application " | TOPICAL_OINTMENT | Freq: Two times a day (BID) | CUTANEOUS | Status: DC
  Administered 2014-07-24 – 2014-07-28 (×9): 1 via NASAL
  Filled 2014-07-24 (×3): qty 22

## 2014-07-24 MED ORDER — INFLUENZA VAC SPLIT QUAD 0.5 ML IM SUSY
0.5000 mL | PREFILLED_SYRINGE | INTRAMUSCULAR | Status: AC
  Administered 2014-07-25: 0.5 mL via INTRAMUSCULAR
  Filled 2014-07-24: qty 0.5

## 2014-07-24 MED ORDER — ALBUTEROL SULFATE (2.5 MG/3ML) 0.083% IN NEBU
2.5000 mg | INHALATION_SOLUTION | RESPIRATORY_TRACT | Status: DC | PRN
  Administered 2014-07-27: 2.5 mg via RESPIRATORY_TRACT
  Filled 2014-07-24: qty 3

## 2014-07-24 MED ORDER — LEVALBUTEROL HCL 1.25 MG/0.5ML IN NEBU
1.2500 mg | INHALATION_SOLUTION | Freq: Four times a day (QID) | RESPIRATORY_TRACT | Status: DC
  Administered 2014-07-24 – 2014-07-27 (×12): 1.25 mg via RESPIRATORY_TRACT
  Filled 2014-07-24 (×12): qty 0.5

## 2014-07-24 MED ORDER — SODIUM CHLORIDE 0.9 % IV SOLN: INTRAVENOUS | Status: DC

## 2014-07-24 MED ORDER — METHYLPREDNISOLONE SODIUM SUCC 125 MG IJ SOLR
125.0000 mg | Freq: Once | INTRAMUSCULAR | Status: AC
  Administered 2014-07-24: 125 mg via INTRAVENOUS
  Filled 2014-07-24: qty 2

## 2014-07-24 MED ORDER — ROCURONIUM BROMIDE 50 MG/5ML IV SOLN
INTRAVENOUS | Status: AC
  Filled 2014-07-24: qty 2

## 2014-07-24 MED ORDER — PROPOFOL 10 MG/ML IV EMUL
INTRAVENOUS | Status: AC
  Administered 2014-07-24: 5 ug/kg/min via INTRAVENOUS
  Filled 2014-07-24: qty 100

## 2014-07-24 MED ORDER — CHLORHEXIDINE GLUCONATE 0.12 % MT SOLN
15.0000 mL | Freq: Two times a day (BID) | OROMUCOSAL | Status: DC
  Administered 2014-07-24 – 2014-07-26 (×5): 15 mL via OROMUCOSAL
  Filled 2014-07-24 (×5): qty 15

## 2014-07-24 MED ORDER — SUCCINYLCHOLINE CHLORIDE 20 MG/ML IJ SOLN
INTRAMUSCULAR | Status: AC
  Administered 2014-07-24: 150 mg
  Filled 2014-07-24: qty 1

## 2014-07-24 MED ORDER — VANCOMYCIN HCL IN DEXTROSE 1-5 GM/200ML-% IV SOLN
1000.0000 mg | Freq: Two times a day (BID) | INTRAVENOUS | Status: DC
  Filled 2014-07-24 (×4): qty 200

## 2014-07-24 MED ORDER — CHLORHEXIDINE GLUCONATE CLOTH 2 % EX PADS
6.0000 | MEDICATED_PAD | Freq: Every day | CUTANEOUS | Status: DC
  Administered 2014-07-25 – 2014-07-28 (×4): 6 via TOPICAL

## 2014-07-24 MED ORDER — PANTOPRAZOLE SODIUM 40 MG IV SOLR
40.0000 mg | INTRAVENOUS | Status: DC
  Administered 2014-07-24 – 2014-07-26 (×3): 40 mg via INTRAVENOUS
  Filled 2014-07-24 (×3): qty 40

## 2014-07-24 MED ORDER — VANCOMYCIN HCL 10 G IV SOLR
2000.0000 mg | Freq: Once | INTRAVENOUS | Status: AC
  Administered 2014-07-24: 2000 mg via INTRAVENOUS
  Filled 2014-07-24: qty 2000

## 2014-07-24 MED ORDER — SODIUM CHLORIDE 0.9 % IJ SOLN
3.0000 mL | Freq: Two times a day (BID) | INTRAMUSCULAR | Status: DC
  Administered 2014-07-24 – 2014-07-26 (×4): 3 mL via INTRAVENOUS
  Administered 2014-07-26: 10 mL via INTRAVENOUS
  Administered 2014-07-27 – 2014-07-28 (×3): 3 mL via INTRAVENOUS

## 2014-07-24 MED ORDER — VANCOMYCIN HCL IN DEXTROSE 1-5 GM/200ML-% IV SOLN
1000.0000 mg | Freq: Two times a day (BID) | INTRAVENOUS | Status: DC
  Administered 2014-07-24 – 2014-07-26 (×4): 1000 mg via INTRAVENOUS
  Filled 2014-07-24 (×10): qty 200

## 2014-07-24 MED ORDER — HEPARIN SODIUM (PORCINE) 5000 UNIT/ML IJ SOLN
5000.0000 [IU] | Freq: Three times a day (TID) | INTRAMUSCULAR | Status: DC
  Administered 2014-07-24 – 2014-07-28 (×12): 5000 [IU] via SUBCUTANEOUS
  Filled 2014-07-24 (×12): qty 1

## 2014-07-24 MED ORDER — FENTANYL CITRATE 0.05 MG/ML IJ SOLN
75.0000 ug | Freq: Once | INTRAMUSCULAR | Status: AC
Start: 2014-07-24 — End: 2014-07-24
  Administered 2014-07-24: 75 ug via INTRAVENOUS
  Filled 2014-07-24: qty 2

## 2014-07-24 MED ORDER — KCL IN DEXTROSE-NACL 20-5-0.45 MEQ/L-%-% IV SOLN
INTRAVENOUS | Status: DC
  Administered 2014-07-24 – 2014-07-27 (×4): via INTRAVENOUS

## 2014-07-24 MED ORDER — SODIUM CHLORIDE 0.9 % IV BOLUS (SEPSIS)
1000.0000 mL | Freq: Once | INTRAVENOUS | Status: AC
  Administered 2014-07-24: 1000 mL via INTRAVENOUS

## 2014-07-24 MED ORDER — LEVOFLOXACIN IN D5W 750 MG/150ML IV SOLN
750.0000 mg | INTRAVENOUS | Status: DC
  Administered 2014-07-24 – 2014-07-28 (×5): 750 mg via INTRAVENOUS
  Filled 2014-07-24 (×5): qty 150

## 2014-07-24 MED ORDER — CETYLPYRIDINIUM CHLORIDE 0.05 % MT LIQD
7.0000 mL | Freq: Four times a day (QID) | OROMUCOSAL | Status: DC
  Administered 2014-07-24 – 2014-07-26 (×8): 7 mL via OROMUCOSAL

## 2014-07-24 MED ORDER — PNEUMOCOCCAL VAC POLYVALENT 25 MCG/0.5ML IJ INJ
0.5000 mL | INJECTION | INTRAMUSCULAR | Status: AC
  Administered 2014-07-25: 0.5 mL via INTRAMUSCULAR
  Filled 2014-07-24: qty 0.5

## 2014-07-24 MED ORDER — PROPOFOL 10 MG/ML IV EMUL
5.0000 ug/kg/min | INTRAVENOUS | Status: DC
  Administered 2014-07-24: 20 ug/kg/min via INTRAVENOUS
  Administered 2014-07-24: 15 ug/kg/min via INTRAVENOUS
  Administered 2014-07-25: 20 ug/kg/min via INTRAVENOUS
  Administered 2014-07-25 – 2014-07-26 (×2): 12.5 ug/kg/min via INTRAVENOUS
  Filled 2014-07-24: qty 100
  Filled 2014-07-24: qty 200
  Filled 2014-07-24 (×2): qty 100

## 2014-07-24 MED ORDER — PROPOFOL 10 MG/ML IV EMUL
INTRAVENOUS | Status: AC
  Administered 2014-07-24: 20 ug/kg/min via INTRAVENOUS
  Filled 2014-07-24: qty 100

## 2014-07-24 MED ORDER — PROPOFOL 10 MG/ML IV EMUL
5.0000 ug/kg/min | Freq: Once | INTRAVENOUS | Status: AC
  Administered 2014-07-24: 5 ug/kg/min via INTRAVENOUS

## 2014-07-24 MED ORDER — ETOMIDATE 2 MG/ML IV SOLN
INTRAVENOUS | Status: AC
  Administered 2014-07-24: 20 mg
  Filled 2014-07-24: qty 20

## 2014-07-24 NOTE — H&P (Addendum)
[facility] History and Physical  Bethany Cairoenny E Sharman BJY:[id number] DOB: 01/02/1956 DOA: 07/24/2014  Referring physician: Dr. Read DriversMolpus PCP: Rudi HeapMOORE, DONALD, MD   Chief Complaint: Respiratory failure  HPI: Bethany Villa is a 58 y.o. female  With past medical history listed below. Presented to the hospital after son found patient with agonal breathing on the field. Reportedly in the field patient had some Narcan which the patient became more alert. His required intubation but after Narcan was extubated reportedly patient still had an required suctioning. Once she arrived in the ED was having difficulty breathing and was intubated again. We were consulted for medical evaluation and recommendations.  Pulmonology will be consulted for medical management and further recommendations regarding her respiratory status.   Review of Systems:  Unable to assess patient intubated  Past Medical History  Diagnosis Date  . Fibromyalgia   . Asthma   . DJD (degenerative joint disease)   . Chronic pain   . Depression   . Anxiety   . Panic attack   . Sleep disorder   . DJD (degenerative joint disease)   . GERD (gastroesophageal reflux disease)   . Hyperlipidemia   . COPD (chronic obstructive pulmonary disease)   . On home oxygen therapy    Past Surgical History  Procedure Laterality Date  . Abdominal surgery    . Carpal tunnel release    . Ankle surgery    . Knee surgery    . Cesarean section    . Tonsillectomy    . Tubal ligation    . Back surgery     Social History:  reports that she quit smoking about 9 months ago. Her smoking use included Cigarettes. She has a 8 pack-year smoking history. She has never used smokeless tobacco. She reports that she does not drink alcohol or use illicit drugs.  Allergies  Allergen Reactions  . Mobic [Meloxicam] Anaphylaxis  . Penicillins Shortness Of Breath and Swelling  . Sulfa Antibiotics Shortness Of Breath and Swelling    Family History    Problem Relation Age of Onset  . Cancer Mother     beast     Prior to Admission medications   Medication Sig Start Date End Date Taking? Authorizing Provider  albuterol (PROVENTIL) (2.5 MG/3ML) 0.083% nebulizer solution Take 3 mLs (2.5 mg total) by nebulization every 6 (six) hours as needed for wheezing or shortness of breath. 09/24/13   [name], FNP  atorvastatin (LIPITOR) 20 MG tablet Take 1 tablet (20 mg total) by mouth daily. 04/13/13   Ernestina Pennaonald W Moore, MD  benzonatate (TESSALON) 100 MG capsule TAKE 1 CAPSULE (100 MG TOTAL) BY MOUTH 2 (TWO) TIMES DAILY AS NEEDED FOR COUGH. 07/23/14   [name], FNP  buPROPion (WELLBUTRIN XL) 150 MG 24 hr tablet Take 150 mg by mouth daily.  06/30/13   Historical Provider, MD  buPROPion (WELLBUTRIN XL) 150 MG 24 hr tablet TAKE 1 TABLET (150 MG TOTAL) BY MOUTH EVERY MORNING.    [name], FNP  cyclobenzaprine (FLEXERIL) 10 MG tablet TAKE 1 TABLET (10 MG TOTAL) BY MOUTH 3 (THREE) TIMES DAILY AS NEEDED FOR MUSCLE SPASMS. 07/05/14   [name], FNP  diazepam (VALIUM) 5 MG tablet TAKE 1 TABLET BY MOUTH EVERY 8 HOURS 07/03/14   [name], FNP  DULoxetine (CYMBALTA) 60 MG capsule TAKE ONE CAPSULE BY MOUTH EVERY DAY 07/03/14   [name], FNP  fluticasone (FLONASE) 50 MCG/ACT nasal spray PLACE 2 SPRAYS INTO THE NOSE DAILY. 05/07/14  Mary-Margaret Daphine Deutscher, FNP  Fluticasone Furoate-Vilanterol 100-25 MCG/INH AEPB Inhale 1 puff into the lungs daily. 07/08/14   Ernestina Penna, MD  furosemide (LASIX) 40 MG tablet Take 1 tablet (40 mg total) by mouth daily. 04/16/13   Mary-Margaret Daphine Deutscher, FNP  gabapentin (NEURONTIN) 300 MG capsule Take 300 mg by mouth 3 (three) times daily.  08/20/13   Historical Provider, MD  HYDROcodone-homatropine (HYCODAN) 5-1.5 MG/5ML syrup Take 5 mLs by mouth every 8 (eight) hours as needed for cough. 09/27/13   Mary-Margaret Daphine Deutscher, FNP  HYDROmorphone HCl 16 MG T24A Take 16 mg by mouth daily.   08/22/13   Historical Provider, MD  ibuprofen (ADVIL,MOTRIN) 800 MG tablet TAKE 1 TABLET (800 MG TOTAL) BY MOUTH EVERY 6 (SIX) HOURS AS NEEDED FOR PAIN. 07/03/14   Mary-Margaret Daphine Deutscher, FNP  levalbuterol [facility] HFA) 45 MCG/ACT inhaler Inhale 2 puffs into the lungs every 4 (four) hours as needed for wheezing or shortness of breath.    Historical Provider, MD  montelukast (SINGULAIR) 10 MG tablet Take 1 tablet (10 mg total) by mouth at bedtime. 04/13/13   Ernestina Penna, MD  nystatin (MYCOSTATIN) 100000 UNIT/ML suspension Take 5 mLs (500,000 Units total) by mouth 4 (four) times daily. 10/10/13   Mary-Margaret Daphine Deutscher, FNP  omeprazole (PRILOSEC) 20 MG capsule Take 1 capsule (20 mg total) by mouth daily. 04/13/13   Ernestina Penna, MD  omeprazole (PRILOSEC) 20 MG capsule TAKE 1 CAPSULE DAILY    Mary-Margaret Daphine Deutscher, FNP  PATANOL 0.1 % ophthalmic solution PLACE 1 DROP INTO BOTH EYES 2 (TWO) TIMES DAILY. 07/03/14   Mary-Margaret Daphine Deutscher, FNP  predniSONE (DELTASONE) 10 MG tablet 2/5-2/7: Take 40 mg by mouth daily. 2/8-2/10: Take 20 mg by mouth daily. 2/11-2/13: Take 10 mg by mouth daily then stop. 10/03/13   Standley Brooking, MD  PROAIR HFA 108 (90 BASE) MCG/ACT inhaler INHALE 2 PUFFS INTO THE LUNGS EVERY SIX HOURS AS NEEDED FOR WHEEZING OR SHORTNESS OF BREATH 07/03/14   Mary-Margaret Daphine Deutscher, FNP  tiotropium (SPIRIVA HANDIHALER) 18 MCG inhalation capsule Place 1 capsule (18 mcg total) into inhaler and inhale daily. 09/18/13   Mary-Margaret Daphine Deutscher, FNP  traMADol (ULTRAM) 50 MG tablet Take 50 mg by mouth every 6 (six) hours as needed. pain    Historical Provider, MD  XOPENEX HFA 45 MCG/ACT inhaler INHALE 1-2 PUFFS INTO THE LUNGS EVERY 4 (FOUR) HOURS AS NEEDED. FOR SHORTNESS OF BREATH 06/12/14   Mary-Margaret Daphine Deutscher, FNP   Physical Exam: Filed Vitals:   07/24/14 0650 07/24/14 0700 07/24/14 0800 07/24/14 0815  BP: 103/68 106/73  100/81  Pulse: 119 117  94  Temp:   96.3 F (35.7 C)   TempSrc:   Core (Comment)     Resp: 19 15  18   Weight:      SpO2: 100% 100%  100%    Wt Readings from Last 3 Encounters:  07/24/14 108.863 kg (240 lb)  10/10/13 102.513 kg (226 lb)  10/01/13 102.4 kg (225 lb 12 oz)    General:  Patient is sedated and intubated Eyes:  normal lids, irises & conjunctiva ENT: Normal exterior appearance Neck: no LAD, masses or thyromegaly Cardiovascular: RRR, no m/r/g. No LE edema. Respiratory: Currently on ventilator Abdomen: soft, obese, nondistended Skin: no rash or induration seen on limited exam Musculoskeletal: grossly normal tone BUE/BLE Psychiatric: Unable to assess secondary to sedation and intubation Neurologic: Difficult exam due to sedation and intubation          Labs on Admission:  Basic Metabolic Panel:  Recent Labs Lab 07/24/14 0715  NA 142  K 3.5*  CL 102  CO2 29  GLUCOSE 102*  BUN 14  CREATININE 1.26*  CALCIUM 8.0*   Liver Function Tests:  Recent Labs Lab 07/24/14 0715  AST 117*  ALT 123*  ALKPHOS 117  BILITOT 0.4  PROT 6.7  ALBUMIN 3.2*   No results for input(s): LIPASE, AMYLASE in the last 168 hours. No results for input(s): AMMONIA in the last 168 hours. CBC:  Recent Labs Lab 07/24/14 0715  WBC 12.1*  NEUTROABS 8.6*  HGB 14.4  HCT 46.8*  MCV 96.5  PLT 245   Cardiac Enzymes:  Recent Labs Lab 07/24/14 0638  TROPONINI <0.30    BNP (last 3 results) No results for input(s): PROBNP in the last 8760 hours. CBG: No results for input(s): GLUCAP in the last 168 hours.  Radiological Exams on Admission: Ct Head Wo Contrast  07/24/2014   CLINICAL DATA:  Altered mental status.  Initial encounter.  EXAM: CT HEAD WITHOUT CONTRAST  TECHNIQUE: Contiguous axial images were obtained from the base of the skull through the vertex without intravenous contrast.  COMPARISON:  11/15/2011.  FINDINGS: No mass lesion, mass effect, midline shift, hydrocephalus, hemorrhage. No territorial ischemia or acute infarction. Mastoid air cells clear.  Visible paranasal sinuses clear. Calvarium appears intact. Scout images show endotracheal and enteric tubes.  IMPRESSION: Negative CT head.   Electronically Signed   By: Andreas NewportGeoffrey  Lamke M.D.   On: 07/24/2014 07:22   Dg Chest Portable 1 View  07/24/2014   CLINICAL DATA:  Endotracheal tube placement.  Altered mental status.  EXAM: PORTABLE CHEST - 1 VIEW  COMPARISON:  09/29/2013.  FINDINGS: Support apparatus: Endotracheal tube is present. The carina is difficult to visualize but the tip of the tube appears about 7 mm from the carina. This should be withdrawn about 1 cm for better positioning. Tubing projects over the RIGHT side of the neck, presumably external to the patient.  Cardiomediastinal Silhouette: Within normal limits allowing for low volumes.  Lungs: Basilar atelectasis and low volumes.  No pneumothorax.  Effusions:  None.  Other: Gaseous distention of the stomach. Patient may benefit from enteric decompression.  IMPRESSION: 1. Endotracheal tube tip appears to be about 7 mm from the carina. Withdrawal of 1 cm and repeat chest radiograph recommended. 2. Low volume chest. 3. Tubing projecting over the RIGHT side of the neck, presumably external to the patient.   Electronically Signed   By: Andreas NewportGeoffrey  Lamke M.D.   On: 07/24/2014 07:04    EKG: Independently reviewed. Sinus tachycardia with no new ST elevations or depressions  Assessment/Plan Respiratory failure requiring intubation - Consult pulmonology for assistance with management - Etiology uncertain. Of note patient is a chronic opioid pain medication user have response to Narcan on the field.  SIRs - Patient is on chronic steroid use presumably for COPD. As such will apply stress dosing - No source of infection, but white blood cell count elevated and patient is hypothermic raising suspicion for infectious etiology. - We'll cover with broad-spectrum antibiotics at this point  Active Problems:   Chronic pain disorder - We'll hold pain  medication while sedated for vent management    COPD (chronic obstructive pulmonary disease) - On respirator currently - Pulmonology consulted for assistance - Should patient's clinical condition improved plan on placing back on home regimen    Depression - Once patient's condition improves will plan on placing back on home regimen  Fibromyalgia -Once patient's condition improves will plan on placing back on home regimen     Code Status: Presumed full code DVT Prophylaxis: Heparin Family Communication: None at bedside Disposition Plan: ICU with pulmonology consult for ventilator management and sedation medication while on ventilator  Time spent: > 45 minutes (addendum: Critical care time involving critical medical decision making, coordinating care, placing orders, consulting specialist)  Cassara Pia [facility] Pager [phone]

## 2014-07-24 NOTE — Plan of Care (Signed)
Problem: Phase I Progression Outcomes Goal: VTE prophylaxis Outcome: Progressing PT ON Juda HEPARIN Goal: Oral Care per Protocol Outcome: Progressing ORAL CARE PROTOCOL INITIATED Goal: HOB elevated 30 degrees Outcome: Completed/Met Date Met:  07/24/14 Goal: VAP prevention protocol initiated Outcome: Progressing VAP PROTOCOL INITIATED Goal: Sedation Protocol initiated if indicated Outcome: Progressing PT ON PROPOFOL SEDATION PROTOCOL Goal: Pneumonia/flu vaccination screen completed Outcome: Progressing VACCINES ORDERED FOR AM Goal: Initiate hyperglycemia protocol as indicated Outcome: Not Applicable Date Met:  21/11/73

## 2014-07-24 NOTE — ED Provider Notes (Addendum)
CSN: [id number]     Arrival date & time 07/24/14  4098 History   First MD Initiated Contact with Patient 07/24/14 [phone]     Chief Complaint  Patient presents with  . Altered Mental Status     (Consider location/radiation/quality/duration/timing/severity/associated sxs/prior Treatment) HPI  Level 5 Caveat: altered mental status. This is a 58 year old female who is on chronic narcotic therapy. She was found to have an altered mental status by her son just prior to arrival. EMS was called and they found her to be unresponsive with agonal respirations. A King airway was placed. After IV access was secured she was given 2 milligrams of Narcan with improvement in her respirations. She was then extubated but never returned to normal consciousness. She had to be suctioned repeatedly en route to prevent aspiration. It is not known if she took an overdose of drugs. She continues to be minimally responsive despite the Narcan.  Past Medical History  Diagnosis Date  . Fibromyalgia   . Asthma   . DJD (degenerative joint disease)   . Chronic pain   . Depression   . Anxiety   . Panic attack   . Sleep disorder   . DJD (degenerative joint disease)   . GERD (gastroesophageal reflux disease)   . Hyperlipidemia   . COPD (chronic obstructive pulmonary disease)   . On home oxygen therapy    Past Surgical History  Procedure Laterality Date  . Abdominal surgery    . Carpal tunnel release    . Ankle surgery    . Knee surgery    . Cesarean section    . Tonsillectomy    . Tubal ligation    . Back surgery     Family History  Problem Relation Age of Onset  . Cancer Mother     beast   History  Substance Use Topics  . Smoking status: Former Smoker -- 1.00 packs/day for 8 years    Types: Cigarettes    Quit date: 09/26/2013  . Smokeless tobacco: Never Used  . Alcohol Use: No     Comment: occ   OB History    Gravida Para Term Preterm AB TAB SAB Ectopic Multiple Living   3 2 2  1  1   2       Review of Systems  Unable to perform ROS   Allergies  Mobic; Penicillins; and Sulfa antibiotics  Home Medications   Prior to Admission medications   Medication Sig Start Date End Date Taking? Authorizing Provider  albuterol (PROVENTIL) (2.5 MG/3ML) 0.083% nebulizer solution Take 3 mLs (2.5 mg total) by nebulization every 6 (six) hours as needed for wheezing or shortness of breath. 09/24/13   Mary-Margaret Daphine Deutscher, FNP  atorvastatin (LIPITOR) 20 MG tablet Take 1 tablet (20 mg total) by mouth daily. 04/13/13   Ernestina Penna, MD  benzonatate (TESSALON) 100 MG capsule TAKE 1 CAPSULE (100 MG TOTAL) BY MOUTH 2 (TWO) TIMES DAILY AS NEEDED FOR COUGH. 07/23/14   Mary-Margaret Daphine Deutscher, FNP  buPROPion (WELLBUTRIN XL) 150 MG 24 hr tablet Take 150 mg by mouth daily.  06/30/13   Historical Provider, MD  buPROPion (WELLBUTRIN XL) 150 MG 24 hr tablet TAKE 1 TABLET (150 MG TOTAL) BY MOUTH EVERY MORNING.    Mary-Margaret Daphine Deutscher, FNP  cyclobenzaprine (FLEXERIL) 10 MG tablet TAKE 1 TABLET (10 MG TOTAL) BY MOUTH 3 (THREE) TIMES DAILY AS NEEDED FOR MUSCLE SPASMS. 07/05/14   Mary-Margaret Daphine Deutscher, FNP  diazepam (VALIUM) 5 MG tablet TAKE 1 TABLET BY  MOUTH EVERY 8 HOURS 07/03/14   [name], FNP  DULoxetine (CYMBALTA) 60 MG capsule TAKE ONE CAPSULE BY MOUTH EVERY DAY 07/03/14   [name], FNP  fluticasone (FLONASE) 50 MCG/ACT nasal spray PLACE 2 SPRAYS INTO THE NOSE DAILY. 05/07/14   [name], FNP  Fluticasone Furoate-Vilanterol 100-25 MCG/INH AEPB Inhale 1 puff into the lungs daily. 07/08/14   Ernestina Pennaonald W Moore, MD  furosemide (LASIX) 40 MG tablet Take 1 tablet (40 mg total) by mouth daily. 04/16/13   [name], FNP  gabapentin (NEURONTIN) 300 MG capsule Take 300 mg by mouth 3 (three) times daily.  08/20/13   Historical Provider, MD  HYDROcodone-homatropine (HYCODAN) 5-1.5 MG/5ML syrup Take 5 mLs by mouth every 8 (eight) hours as needed for cough. 09/27/13   [name], FNP   HYDROmorphone HCl 16 MG T24A Take 16 mg by mouth daily.  08/22/13   Historical Provider, MD  ibuprofen (ADVIL,MOTRIN) 800 MG tablet TAKE 1 TABLET (800 MG TOTAL) BY MOUTH EVERY 6 (SIX) HOURS AS NEEDED FOR PAIN. 07/03/14   [name], FNP  levalbuterol [facility](XOPENEX HFA) 45 MCG/ACT inhaler Inhale 2 puffs into the lungs every 4 (four) hours as needed for wheezing or shortness of breath.    Historical Provider, MD  montelukast (SINGULAIR) 10 MG tablet Take 1 tablet (10 mg total) by mouth at bedtime. 04/13/13   Ernestina Pennaonald W Moore, MD  nystatin (MYCOSTATIN) 100000 UNIT/ML suspension Take 5 mLs (500,000 Units total) by mouth 4 (four) times daily. 10/10/13   [name], FNP  omeprazole (PRILOSEC) 20 MG capsule Take 1 capsule (20 mg total) by mouth daily. 04/13/13   Ernestina Pennaonald W Moore, MD  omeprazole (PRILOSEC) 20 MG capsule TAKE 1 CAPSULE DAILY    [name], FNP  PATANOL 0.1 % ophthalmic solution PLACE 1 DROP INTO BOTH EYES 2 (TWO) TIMES DAILY. 07/03/14   [name], FNP  predniSONE (DELTASONE) 10 MG tablet 2/5-2/7: Take 40 mg by mouth daily. 2/8-2/10: Take 20 mg by mouth daily. 2/11-2/13: Take 10 mg by mouth daily then stop. 10/03/13   Standley Brookinganiel P Goodrich, MD  PROAIR HFA 108 (90 BASE) MCG/ACT inhaler INHALE 2 PUFFS INTO THE LUNGS EVERY SIX HOURS AS NEEDED FOR WHEEZING OR SHORTNESS OF BREATH 07/03/14   [name], FNP  tiotropium (SPIRIVA HANDIHALER) 18 MCG inhalation capsule Place 1 capsule (18 mcg total) into inhaler and inhale daily. 09/18/13   [name], FNP  traMADol (ULTRAM) 50 MG tablet Take 50 mg by mouth every 6 (six) hours as needed. pain    Historical Provider, MD  XOPENEX HFA 45 MCG/ACT inhaler INHALE 1-2 PUFFS INTO THE LUNGS EVERY 4 (FOUR) HOURS AS NEEDED. FOR SHORTNESS OF BREATH 06/12/14   [name], FNP   BP 98/49 mmHg  Pulse 120  Temp(Src) 100 F (37.8 C) (Core (Comment))  Resp 17  Ht 5\' 6"  (1.676 m)  Wt 234 lb 5.6 oz (106.3 kg)  BMI  37.84 kg/m2  SpO2 98%   Physical Exam General: Well-developed, obese female in no acute distress; appearance consistent with age of record HENT: normocephalic; atraumatic; edentulous Eyes: pupils equal, round and reactive to light but sluggish; mydriasis Neck: supple Heart: regular rate and rhythm; tachycardia; distant sounds Lungs: clear to auscultation bilaterally; distant sounds Abdomen: soft; obese; no masses or hepatosplenomegaly palpated; bowel sounds present Extremities: No deformity; pulses normal Neurologic: We'll open eyes to sternal rub or verbal stimuli; nonverbal; does not follow commands; no purposeful movement Skin: Warm and dry  ED Course  Procedures (including critical care time)  INTUBATION Performed by: Aunika Kirsten L  Required items: required blood products, implants, devices, and special equipment available Patient identity confirmed: provided demographic data and hospital-assigned identification number Time out: Immediately prior to procedure a "time out" was called to verify the correct patient, procedure, equipment, support staff and site/side marked as required.  Indications: Altered mental status; hypercapnic respiratory failure   Intubation method: Glidescope Laryngoscopy   Preoxygenation: BVM  Sedatives: Etomidate Paralytic: Succinylcholine  Tube Size: 7.5 cuffed  Post-procedure assessment: chest rise and ETCO2 monitor Breath sounds: equal and absent over the epigastrium Tube secured with: ETT holder Chest x-ray interpreted by radiologist and me.  Chest x-ray findings: endotracheal tube 7 millimeters proximal to carina; endotracheal tube withdrawn 1 centimeter on radiologist's recommendation.  Patient tolerated the procedure well with no immediate complications.   CRITICAL CARE Performed by: Paula Libra L Total critical care time: 90 minutes Critical care time was exclusive of separately billable procedures and treating other  patients. Critical care was necessary to treat or prevent imminent or life-threatening deterioration. Critical care was time spent personally by me on the following activities: development of treatment plan with patient and/or surrogate as well as nursing, discussions with consultants, evaluation of patient's response to treatment, examination of patient, obtaining history from patient or surrogate, ordering and performing treatments and interventions, ordering and review of laboratory studies, ordering and review of radiographic studies, pulse oximetry and re-evaluation of patient's condition.   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  Summary of this visit's results, reviewed by myself:  Labs:  Results for orders placed or performed during the hospital encounter of 07/24/14 (from the past 24 hour(s))  Urinalysis, Routine w reflex microscopic     Status: Abnormal   Collection Time: 07/24/14  6:35 AM  Result Value Ref Range   Color, Urine YELLOW YELLOW   APPearance CLEAR CLEAR   Specific Gravity, Urine 1.025 1.005 - 1.030   pH 6.0 5.0 - 8.0   Glucose, UA NEGATIVE NEGATIVE mg/dL   Hgb urine dipstick NEGATIVE NEGATIVE   Bilirubin Urine SMALL (A) NEGATIVE   Ketones, ur NEGATIVE NEGATIVE mg/dL   Protein, ur 30 (A) NEGATIVE mg/dL   Urobilinogen, UA 0.2 0.0 - 1.0 mg/dL   Nitrite NEGATIVE NEGATIVE   Leukocytes, UA NEGATIVE NEGATIVE  Drug screen panel, emergency     Status: Abnormal   Collection Time: 07/24/14  6:35 AM  Result Value Ref Range   Opiates POSITIVE (A) NONE DETECTED   Cocaine NONE DETECTED  NONE DETECTED   Benzodiazepines POSITIVE (A) NONE DETECTED   Amphetamines NONE DETECTED NONE DETECTED   Tetrahydrocannabinol NONE DETECTED NONE DETECTED   Barbiturates NONE DETECTED NONE DETECTED  Urine microscopic-add on     Status: None   Collection Time: 07/24/14  6:35 AM  Result Value Ref Range   Squamous Epithelial / LPF RARE RARE   WBC, UA 0-2 <3 WBC/hpf   RBC / HPF 0-2 <3 RBC/hpf  Troponin I     Status: None   Collection Time: 07/24/14  6:38 AM  Result Value Ref  Range   Troponin I <0.30 <0.30 ng/mL  Blood gas, arterial ([facility] & [facility] ONLY)     Status: Abnormal   Collection Time: 07/24/14  6:53 AM  Result Value Ref Range   FIO2 0.60 %   Delivery systems VENTILATOR    Mode PRESSURE REGULATED VOLUME CONTROL    VT 500 mL   Rate 15 resp/min   Peep/cpap 5.0 cm H20   pH, Arterial 7.081 (LL) 7.350 - 7.450   pCO2 arterial 99.2 (HH) 35.0 - 45.0 mmHg   pO2, Arterial 196.0 (H) 80.0 - 100.0 mmHg   Bicarbonate 28.1 (H) 20.0 - 24.0 mEq/L   TCO2 27.2 0 - 100 mmol/L   Acid-base deficit 1.1 0.0 - 2.0 mmol/L   O2 Saturation 98.0 %   Patient temperature 37.0    Collection site RIGHT RADIAL    Drawn by [phone]    Sample type ARTERIAL DRAW    Allens test (pass/fail) PASS PASS  CBC with Differential     Status: Abnormal   Collection Time: 07/24/14  7:15 AM  Result Value Ref Range   WBC 12.1 (H) 4.0 - 10.5 K/uL   RBC 4.85 3.87 - 5.11 MIL/uL   Hemoglobin 14.4 12.0 - 15.0 g/dL   HCT 60.4 (H) 54.0 - 98.1 %   MCV 96.5 78.0 - 100.0 fL   MCH 29.9 26.0 - 34.0 pg   MCHC 29.7 (L) 30.0 - 36.0 g/dL   RDW 19.1 (H) 47.8 - 29.5 %   Platelets 245 150 - 400 K/uL   Neutrophils Relative % 71 43 - 77 %   Neutro Abs 8.6 (H) 1.7 - 7.7 K/uL   Lymphocytes Relative 21 12 - 46 %   Lymphs Abs 2.5 0.7 - 4.0 K/uL   Monocytes Relative 6 3 - 12 %   Monocytes Absolute 0.7 0.1 - 1.0 K/uL   Eosinophils Relative 2 0 - 5 %   Eosinophils Absolute 0.2 0.0 - 0.7 K/uL   Basophils Relative 0 0 - 1 %   Basophils Absolute 0.0  0.0 - 0.1 K/uL  Comprehensive metabolic panel     Status: Abnormal   Collection Time: 07/24/14  7:15 AM  Result Value Ref Range   Sodium 142 137 - 147 mEq/L   Potassium 3.5 (L) 3.7 - 5.3 mEq/L   Chloride 102 96 - 112 mEq/L   CO2 29 19 - 32 mEq/L   Glucose, Bld 102 (H) 70 - 99 mg/dL   BUN 14 6 - 23 mg/dL   Creatinine, Ser 6.21 (H) 0.50 - 1.10 mg/dL   Calcium 8.0 (L) 8.4 - 10.5 mg/dL   Total Protein 6.7 6.0 - 8.3 g/dL   Albumin 3.2 (L) 3.5 - 5.2 g/dL   AST 308 (H) 0 - 37 U/L   ALT 123 (H) 0 - 35 U/L   Alkaline Phosphatase 117 39 - 117 U/L   Total Bilirubin 0.4 0.3 - 1.2 mg/dL   GFR calc non Af Amer 46 (L) >90 mL/min   GFR calc Af Amer 54 (L) >90 mL/min   Anion gap 11 5 - 15  Ethanol     Status: None   Collection Time: 07/24/14  7:15 AM  Result Value Ref Range   Alcohol, Ethyl (B) <11 0 - 11 mg/dL  Salicylate level     Status: Abnormal   Collection Time: 07/24/14  7:15 AM  Result Value Ref Range   Salicylate Lvl <2.0 (L) 2.8 - 20.0 mg/dL  Acetaminophen level  Status: None   Collection Time: 07/24/14  7:15 AM  Result Value Ref Range   Acetaminophen (Tylenol), Serum <15.0 10 - 30 ug/mL  Blood culture (routine x 2)     Status: None (Preliminary result)   Collection Time: 07/24/14  7:15 AM  Result Value Ref Range   Specimen Description Blood    Special Requests NONE    Culture NO GROWTH <24 HRS    Report Status PENDING   Blood culture (routine x 2)     Status: None (Preliminary result)   Collection Time: 07/24/14  8:56 AM  Result Value Ref Range   Specimen Description Blood    Special Requests NONE    Culture NO GROWTH <24 HRS    Report Status PENDING   I-Stat CG4 Lactic Acid, ED     Status: None   Collection Time: 07/24/14  8:59 AM  Result Value Ref Range   Lactic Acid, Venous 1.43 0.5 - 2.2 mmol/L    Imaging Studies: Ct Head Wo Contrast  07/24/2014   CLINICAL DATA:  Altered mental status.  Initial encounter.  EXAM: CT HEAD WITHOUT CONTRAST  TECHNIQUE: Contiguous  axial images were obtained from the base of the skull through the vertex without intravenous contrast.  COMPARISON:  11/15/2011.  FINDINGS: No mass lesion, mass effect, midline shift, hydrocephalus, hemorrhage. No territorial ischemia or acute infarction. Mastoid air cells clear. Visible paranasal sinuses clear. Calvarium appears intact. Scout images show endotracheal and enteric tubes.  IMPRESSION: Negative CT head.   Electronically Signed   By: Andreas NewportGeoffrey  Lamke M.D.   On: 07/24/2014 07:22   Dg Chest Portable 1 View  07/24/2014   CLINICAL DATA:  Endotracheal tube placement.  Altered mental status.  EXAM: PORTABLE CHEST - 1 VIEW  COMPARISON:  09/29/2013.  FINDINGS: Support apparatus: Endotracheal tube is present. The carina is difficult to visualize but the tip of the tube appears about 7 mm from the carina. This should be withdrawn about 1 cm for better positioning. Tubing projects over the RIGHT side of the neck, presumably external to the patient.  Cardiomediastinal Silhouette: Within normal limits allowing for low volumes.  Lungs: Basilar atelectasis and low volumes.  No pneumothorax.  Effusions:  None.  Other: Gaseous distention of the stomach. Patient may benefit from enteric decompression.  IMPRESSION: 1. Endotracheal tube tip appears to be about 7 mm from the carina. Withdrawal of 1 cm and repeat chest radiograph recommended. 2. Low volume chest. 3. Tubing projecting over the RIGHT side of the neck, presumably external to the patient.   Electronically Signed   By: Andreas NewportGeoffrey  Lamke M.D.   On: 07/24/2014 07:04   7:57 AM Patient intubated and placed on propofol drip for postintubation sedation. She was taken emergently to the CT scanner which showed no acute intracranial abnormality. Blood gas demonstrates hypercapnic respiratory failure and ventilator rate was increased. Warming blanket ordered for persistent hypothermia.   8:14 AM Blood pressure is 90 systolic. This may be secondary to propofol.  Lactic acid and blood cultures ordered and we will give a fluid bolus of warm saline. Dr. Cena BentonVega of [facility] to admit.    Final diagnoses:  Acute respiratory failure with hypercapnia  Respiratory failure requiring intubation  Altered mental status, unspecified altered mental status type  Hypothermia, initial encounter     Hanley SeamenJohn L Arnaldo Heffron, MD 07/24/14 [id number]  Hanley SeamenJohn L Lekeisha Arenas, MD 07/24/14 1028

## 2014-07-24 NOTE — Care Management Utilization Note (Signed)
UR review complete.  

## 2014-07-24 NOTE — Telephone Encounter (Signed)
Left detailed msg on home number advising rx ready for pickup to [facility] with any questions or concerns.

## 2014-07-24 NOTE — Progress Notes (Signed)
ANTIBIOTIC CONSULT NOTE  Pharmacy Consult for Vancomycin  Indication: rule out sepsis  Allergies  Allergen Reactions  . Mobic [Meloxicam] Anaphylaxis  . Penicillins Shortness Of Breath and Swelling  . Sulfa Antibiotics Shortness Of Breath and Swelling    Patient Measurements: Height: 5\' 6"  (167.6 cm) Weight: 234 lb 5.6 oz (106.3 kg) IBW/kg (Calculated) : 59.3  Vital Signs: Temp: 100 F (37.8 C) (11/25 0958) Temp Source: Core (Comment) (11/25 0958) BP: 98/49 mmHg (11/25 0958) Pulse Rate: 120 (11/25 0958) Intake/Output from previous day:   Intake/Output from this shift:    Labs:  Recent Labs  07/24/14 0715  WBC 12.1*  HGB 14.4  PLT 245  CREATININE 1.26*   Estimated Creatinine Clearance: 60.7 mL/min (by C-G formula based on Cr of 1.26). No results for input(s): VANCOTROUGH, VANCOPEAK, VANCORANDOM, GENTTROUGH, GENTPEAK, GENTRANDOM, TOBRATROUGH, TOBRAPEAK, TOBRARND, AMIKACINPEAK, AMIKACINTROU, AMIKACIN in the last 72 hours.   Microbiology: Recent Results (from the past 720 hour(s))  Blood culture (routine x 2)     Status: None (Preliminary result)   Collection Time: 07/24/14  7:15 AM  Result Value Ref Range Status   Specimen Description Blood  Final   Special Requests NONE  Final   Culture NO GROWTH <24 HRS  Final   Report Status PENDING  Incomplete  Blood culture (routine x 2)     Status: None (Preliminary result)   Collection Time: 07/24/14  8:56 AM  Result Value Ref Range Status   Specimen Description Blood  Final   Special Requests NONE  Final   Culture NO GROWTH <24 HRS  Final   Report Status PENDING  Incomplete    Anti-infectives    Start     Dose/Rate Route Frequency Ordered Stop   07/24/14 2359  vancomycin (VANCOCIN) IVPB 1000 mg/200 mL premix     1,000 mg200 mL/hr over 60 Minutes Intravenous Every 12 hours 07/24/14 1036     07/24/14 1100  vancomycin (VANCOCIN) 2,000 mg in sodium chloride 0.9 % 500 mL IVPB     2,000 mg250 mL/hr over 120 Minutes  Intravenous  Once 07/24/14 1033        Assessment: 58 yo obese F found unresponsive by son & brought to ED.  She was in acute respiratory distress en route and intubated. WBC mildly elevated.  Lactic acid is normal.  Patient is hypothermic with hypotension, tachycardia.  Acute renal failure noted.   PCN allergy noted = SOB therefore Cefepime was changed to Levaquin for presumed COPD exacerbation.  Vancomycin 11/25>> Levaquin 11/25>>  Goal of Therapy:  Vancomycin trough level 15-20 mcg/ml  Plan:  Levaquin 750mg  IV q24h Vancomycin 2gm IV x1 now then 1gm IV q12h Check Vancomycin trough at steady state Monitor renal function and cx data  Duration of therapy per MD  Elson ClanLilliston, Deborah Dondero Michelle 07/24/2014,10:36 AM

## 2014-07-24 NOTE — ED Notes (Signed)
bair hugger placed on patient

## 2014-07-24 NOTE — Consult Note (Signed)
Consult requested by: [facility] Consult requested for respiratory failure:  HPI: This is a 58 year old who is brought to the emergency department after being found at home with agonal respirations. She apparently received some Narcan and became more alert but she had to be intubated in the emergency department. She has past medical history as listed. Her urine drug screen was positive for opiates and benzodiazepines. She does have COPD and is on home oxygen. No history is available from her because she is intubated and on the ventilator Past Medical History  Diagnosis Date  . Fibromyalgia   . Asthma   . DJD (degenerative joint disease)   . Chronic pain   . Depression   . Anxiety   . Panic attack   . Sleep disorder   . DJD (degenerative joint disease)   . GERD (gastroesophageal reflux disease)   . Hyperlipidemia   . COPD (chronic obstructive pulmonary disease)   . On home oxygen therapy      Family History  Problem Relation Age of Onset  . Cancer Mother     beast     History   Social History  . Marital Status: Divorced    Spouse Name: N/A    Number of Children: N/A  . Years of Education: N/A   Occupational History  . disabled    Social History Main Topics  . Smoking status: Former Smoker -- 1.00 packs/day for 8 years    Types: Cigarettes    Quit date: 09/26/2013  . Smokeless tobacco: Never Used  . Alcohol Use: No     Comment: occ  . Drug Use: No  . Sexual Activity: Yes    Birth Control/ Protection: Surgical   Other Topics Concern  . None   Social History Narrative     ROS:  Not obtainable    Objective: Vital signs in last 24 hours: Temp:  [96.1 F (35.6 C)-101.2 F (38.4 C)] 101.2 F (38.4 C) (11/25 1130) Pulse Rate:  [94-123] 120 (11/25 0958) Resp:  [15-21] 17 (11/25 0927) BP: (98-129)/(49-89) 98/49 mmHg (11/25 0958) SpO2:  [94 %-100 %] 100 % (11/25 1144) FiO2 (%):  [50 %-60 %] 50 % (11/25 1144) Weight:  [106.3 kg (234 lb 5.6 oz)-108.863  kg (240 lb)] 106.3 kg (234 lb 5.6 oz) (11/25 0958) Weight change:     Intake/Output from previous day:    PHYSICAL EXAM She is intubated sedated and on the ventilator. Her pupils react as interpreter clear mucous membranes are moist her chest is with some rhonchi bilaterally. Heart is regular without gallop. She does not have any edema of the extremities. Her abdomen is soft bowel sounds present and active. Neurological examination really not able to be done because of her sedation but she does open her eye.  Lab Results: Basic Metabolic Panel:  Recent Labs  96/11/5409/25/15 0715  NA 142  K 3.5*  CL 102  CO2 29  GLUCOSE 102*  BUN 14  CREATININE 1.26*  CALCIUM 8.0*  MG 1.7  PHOS 6.9*   Liver Function Tests:  Recent Labs  07/24/14 0715  AST 117*  ALT 123*  ALKPHOS 117  BILITOT 0.4  PROT 6.7  ALBUMIN 3.2*   No results for input(s): LIPASE, AMYLASE in the last 72 hours. No results for input(s): AMMONIA in the last 72 hours. CBC:  Recent Labs  07/24/14 0715  WBC 12.1*  NEUTROABS 8.6*  HGB 14.4  HCT 46.8*  MCV 96.5  PLT 245   Cardiac Enzymes:  Recent Labs  07/24/14 0638  TROPONINI <0.30   BNP: No results for input(s): PROBNP in the last 72 hours. D-Dimer: No results for input(s): DDIMER in the last 72 hours. CBG: No results for input(s): GLUCAP in the last 72 hours. Hemoglobin A1C: No results for input(s): HGBA1C in the last 72 hours. Fasting Lipid Panel: No results for input(s): CHOL, HDL, LDLCALC, TRIG, CHOLHDL, LDLDIRECT in the last 72 hours. Thyroid Function Tests: No results for input(s): TSH, T4TOTAL, FREET4, T3FREE, THYROIDAB in the last 72 hours. Anemia Panel: No results for input(s): VITAMINB12, FOLATE, FERRITIN, TIBC, IRON, RETICCTPCT in the last 72 hours. Coagulation: No results for input(s): LABPROT, INR in the last 72 hours. Urine Drug Screen: Drugs of Abuse     Component Value Date/Time   LABOPIA POSITIVE* 07/24/2014 0635   COCAINSCRNUR  NONE DETECTED 07/24/2014 0635   LABBENZ POSITIVE* 07/24/2014 0635   AMPHETMU NONE DETECTED 07/24/2014 0635   THCU NONE DETECTED 07/24/2014 0635   LABBARB NONE DETECTED 07/24/2014 0635    Alcohol Level:  Recent Labs  07/24/14 0715  ETH <11   Urinalysis:  Recent Labs  07/24/14 0635  COLORURINE YELLOW  LABSPEC 1.025  PHURINE 6.0  GLUCOSEU NEGATIVE  HGBUR NEGATIVE  BILIRUBINUR SMALL*  KETONESUR NEGATIVE  PROTEINUR 30*  UROBILINOGEN 0.2  NITRITE NEGATIVE  LEUKOCYTESUR NEGATIVE   Misc. Labs:   ABGS:  Recent Labs  07/24/14 1015  PHART 7.199*  PO2ART 89.2  TCO2 23.3  HCO3 25.5*     MICROBIOLOGY: Recent Results (from the past 240 hour(s))  Blood culture (routine x 2)     Status: None (Preliminary result)   Collection Time: 07/24/14  7:15 AM  Result Value Ref Range Status   Specimen Description Blood  Final   Special Requests NONE  Final   Culture NO GROWTH <24 HRS  Final   Report Status PENDING  Incomplete  Blood culture (routine x 2)     Status: None (Preliminary result)   Collection Time: 07/24/14  8:56 AM  Result Value Ref Range Status   Specimen Description Blood  Final   Special Requests NONE  Final   Culture NO GROWTH <24 HRS  Final   Report Status PENDING  Incomplete    Studies/Results: Ct Head Wo Contrast  07/24/2014   CLINICAL DATA:  Altered mental status.  Initial encounter.  EXAM: CT HEAD WITHOUT CONTRAST  TECHNIQUE: Contiguous axial images were obtained from the base of the skull through the vertex without intravenous contrast.  COMPARISON:  11/15/2011.  FINDINGS: No mass lesion, mass effect, midline shift, hydrocephalus, hemorrhage. No territorial ischemia or acute infarction. Mastoid air cells clear. Visible paranasal sinuses clear. Calvarium appears intact. Scout images show endotracheal and enteric tubes.  IMPRESSION: Negative CT head.   Electronically Signed   By: Andreas Newport M.D.   On: 07/24/2014 07:22   Dg Chest Portable 1  View  07/24/2014   CLINICAL DATA:  Endotracheal tube placement.  Altered mental status.  EXAM: PORTABLE CHEST - 1 VIEW  COMPARISON:  09/29/2013.  FINDINGS: Support apparatus: Endotracheal tube is present. The carina is difficult to visualize but the tip of the tube appears about 7 mm from the carina. This should be withdrawn about 1 cm for better positioning. Tubing projects over the RIGHT side of the neck, presumably external to the patient.  Cardiomediastinal Silhouette: Within normal limits allowing for low volumes.  Lungs: Basilar atelectasis and low volumes.  No pneumothorax.  Effusions:  None.  Other: Gaseous distention  of the stomach. Patient may benefit from enteric decompression.  IMPRESSION: 1. Endotracheal tube tip appears to be about 7 mm from the carina. Withdrawal of 1 cm and repeat chest radiograph recommended. 2. Low volume chest. 3. Tubing projecting over the RIGHT side of the neck, presumably external to the patient.   Electronically Signed   By: Andreas NewportGeoffrey  Lamke M.D.   On: 07/24/2014 07:04    Medications:  Prior to Admission:  Prescriptions prior to admission  Medication Sig Dispense Refill Last Dose  . albuterol (PROVENTIL) (2.5 MG/3ML) 0.083% nebulizer solution Take 3 mLs (2.5 mg total) by nebulization every 6 (six) hours as needed for wheezing or shortness of breath. 75 mL 3 Taking  . benzonatate (TESSALON) 100 MG capsule TAKE 1 CAPSULE (100 MG TOTAL) BY MOUTH 2 (TWO) TIMES DAILY AS NEEDED FOR COUGH. 20 capsule 0   . cyclobenzaprine (FLEXERIL) 10 MG tablet TAKE 1 TABLET (10 MG TOTAL) BY MOUTH 3 (THREE) TIMES DAILY AS NEEDED FOR MUSCLE SPASMS. 90 tablet 2   . diazepam (VALIUM) 5 MG tablet TAKE 1 TABLET BY MOUTH EVERY 8 HOURS 90 tablet 0   . diclofenac (VOLTAREN) 75 MG EC tablet Take 75 mg by mouth 2 (two) times daily.     . DULoxetine (CYMBALTA) 60 MG capsule TAKE ONE CAPSULE BY MOUTH EVERY DAY 30 capsule 0   . fluticasone (FLONASE) 50 MCG/ACT nasal spray PLACE 2 SPRAYS INTO THE  NOSE DAILY. 16 g 1   . HYDROmorphone HCl (EXALGO) 12 MG T24A SR tablet Take 12 mg by mouth daily.     Marland Kitchen. ibuprofen (ADVIL,MOTRIN) 800 MG tablet TAKE 1 TABLET (800 MG TOTAL) BY MOUTH EVERY 6 (SIX) HOURS AS NEEDED FOR PAIN. 90 tablet 0   . levalbuterol (XOPENEX HFA) 45 MCG/ACT inhaler Inhale 2 puffs into the lungs every 4 (four) hours as needed for wheezing or shortness of breath.   Taking  . Naloxone HCl (EVZIO) 0.4 MG/0.4ML SOAJ Inject as directed.     Marland Kitchen. PROAIR HFA 108 (90 BASE) MCG/ACT inhaler INHALE 2 PUFFS INTO THE LUNGS EVERY SIX HOURS AS NEEDED FOR WHEEZING OR SHORTNESS OF BREATH 8.5 each 2   . traMADol (ULTRAM) 50 MG tablet Take 50 mg by mouth every 6 (six) hours as needed. pain   Taking  . atorvastatin (LIPITOR) 20 MG tablet Take 1 tablet (20 mg total) by mouth daily. 90 tablet 0 Taking  . buPROPion (WELLBUTRIN XL) 150 MG 24 hr tablet TAKE 1 TABLET (150 MG TOTAL) BY MOUTH EVERY MORNING. 90 tablet 0   . Fluticasone Furoate-Vilanterol 100-25 MCG/INH AEPB Inhale 1 puff into the lungs daily. 14 each 0   . furosemide (LASIX) 40 MG tablet Take 1 tablet (40 mg total) by mouth daily. 90 tablet 0 Taking  . gabapentin (NEURONTIN) 300 MG capsule Take 300 mg by mouth 3 (three) times daily.    Taking  . montelukast (SINGULAIR) 10 MG tablet Take 1 tablet (10 mg total) by mouth at bedtime. 90 tablet 1 Taking  . omeprazole (PRILOSEC) 20 MG capsule Take 1 capsule (20 mg total) by mouth daily. 90 capsule 1 Taking  . PATANOL 0.1 % ophthalmic solution PLACE 1 DROP INTO BOTH EYES 2 (TWO) TIMES DAILY. 15 mL 0   . tiotropium (SPIRIVA HANDIHALER) 18 MCG inhalation capsule Place 1 capsule (18 mcg total) into inhaler and inhale daily. 30 capsule 12 Taking   Scheduled: . antiseptic oral rinse  7 mL Mouth Rinse QID  . chlorhexidine  15  mL Mouth Rinse BID  . heparin  5,000 Units Subcutaneous 3 times per day  . levalbuterol  1.25 mg Nebulization 4 times per day  . levofloxacin (LEVAQUIN) IV  750 mg Intravenous Q24H   . lidocaine (cardiac) 100 mg/60ml      . pantoprazole (PROTONIX) IV  40 mg Intravenous Q24H  . [START ON 07/25/2014] pneumococcal 23 valent vaccine  0.5 mL Intramuscular Tomorrow-1000  . rocuronium      . sodium chloride  3 mL Intravenous Q12H  . vancomycin  2,000 mg Intravenous Once  . vancomycin  1,000 mg Intravenous Q12H   Continuous: . sodium chloride    . dextrose 5 % and 0.45 % NaCl with KCl 20 mEq/L 75 mL/hr at 07/24/14 1037  . propofol     ZOX:[id number]  Assesment: she was admitted with acute on chronic respiratory failure requiring intubation and mechanical ventilation. She has a lot of secretions and presumably aspirated. She is on appropriate treatment. I have written ventilator orders. She was still somewhat acidemic with last blood gas. Another one is being done now. Active Problems:   Chronic pain disorder   COPD (chronic obstructive pulmonary disease)   Depression   Fibromyalgia   Acute-on-chronic respiratory failure   Respiratory failure requiring intubation   SIRS (systemic inflammatory response syndrome)    Plan: continue current treatments. Continue ventilator support antibiotics etc.  Thanks for allowing me to see her with you    LOS: 0 days   Cynda Soule L 07/24/2014, 1:26 PM

## 2014-07-24 NOTE — ED Notes (Signed)
Per EMS: pt was unconscious with agonal breathing. A king airway was placed. And 2 mg of Narcan given. Pt began breathing on own, king airway extubated. Pt only responding to painful stimuli. BP 119/75; HR 135 CBG 141

## 2014-07-24 NOTE — Progress Notes (Signed)
INITIAL NUTRITION ASSESSMENT  DOCUMENTATION CODES Per approved criteria  -Obesity Unspecified   INTERVENTION: If pt unable to wean within 48 hours of intubation, recommend: Initiate Vital High Protein @ 20 ml/hr via OGT.  60 ml Prostat TID.    Tube feeding regimen provides 1080 kcal (1339 kcals with current propofol dosage-100% of needs), 132 grams of protein, and 401 ml of H2O.   NUTRITION DIAGNOSIS: Inadequate oral intake related to inability to eat as evidenced by NPO.   Goal: Enteral nutrition to provide 60-70% of estimated calorie needs (22-25 kcals/kg ideal body weight) and 100% of estimated protein needs, based on ASPEN guidelines for hypocaloric, high protein feeding in critically ill obese individuals  Monitor:  Respiratory status, TF initiation/ tolerance/advancement/management, weight changes, labs, I/o"s  Reason for Assessment: VDRF  58 y.o. female  Admitting Dx: <principal problem not specified>  Bethany Villa is a 58 y.o. female with past medical history listed below. Presented to the hospital after son found patient with agonal breathing on the field. Reportedly in the field patient had some Narcan which the patient became more alert. His required intubation but after Narcan was extubated reportedly patient still had an required suctioning. Once she arrived in the ED was having difficulty breathing and was intubated again.   ASSESSMENT: Patient is currently intubated on ventilator support. OGT in place MV: 9.2 L/min Temp (24hrs), Avg:98.1 F (36.7 C), Min:96.1 F (35.6 C), Max:101.2 F (38.4 C)  Propofol: 9.8 ml/hr (259 kcals)  Spoke with RN and PT who both report no plans for extubation today.  Nutrition focused physical exam revealed no signs of fat or muscle depletion.  Labs reviewed. K: 3.5, Creat: 1.26, Calcium: 8.0, Phos: 6.9, glucose: 102. Mg WDL.   Height: Ht Readings from Last 1 Encounters:  07/24/14 5\' 6"  (1.676 m)    Weight: Wt Readings  from Last 1 Encounters:  07/24/14 234 lb 5.6 oz (106.3 kg)    Ideal Body Weight: 130#  % Ideal Body Weight: 180%  Wt Readings from Last 10 Encounters:  07/24/14 234 lb 5.6 oz (106.3 kg)  10/10/13 226 lb (102.513 kg)  10/01/13 225 lb 12 oz (102.4 kg)  09/18/13 221 lb (100.245 kg)  07/24/13 213 lb (96.616 kg)  05/08/13 227 lb 4.7 oz (103.1 kg)  02/06/13 234 lb (106.142 kg)  12/28/12 230 lb (104.327 kg)  12/02/12 234 lb 9.1 oz (106.4 kg)  07/15/12 200 lb (90.719 kg)    Usual Body Weight: 230#  % Usual Body Weight: 102%  BMI:  Body mass index is 37.84 kg/(m^2). Obesity, class II  Estimated Nutritional Needs: Kcal: 2087.3 Underfeeding Kcals: 1300-1477 Protein: > 118 grams Fluid: >1.5 L  Skin: Intact  Diet Order: Diet NPO time specified  EDUCATION NEEDS: -Education not appropriate at this time  No intake or output data in the 24 hours ending 07/24/14 1055  Last BM: PTA  Labs:   Recent Labs Lab 07/24/14 0715  NA 142  K 3.5*  CL 102  CO2 29  BUN 14  CREATININE 1.26*  CALCIUM 8.0*  MG 1.7  PHOS 6.9*  GLUCOSE 102*    CBG (last 3)  No results for input(s): GLUCAP in the last 72 hours.  Scheduled Meds: . heparin  5,000 Units Subcutaneous 3 times per day  . levalbuterol  1.25 mg Nebulization 4 times per day  . lidocaine (cardiac) 100 mg/655ml      . [START ON 07/25/2014] pneumococcal 23 valent vaccine  0.5 mL Intramuscular Tomorrow-1000  .  rocuronium      . sodium chloride  3 mL Intravenous Q12H  . vancomycin  2,000 mg Intravenous Once  . vancomycin  1,000 mg Intravenous Q12H    Continuous Infusions: . sodium chloride    . dextrose 5 % and 0.45 % NaCl with KCl 20 mEq/L 75 mL/hr at 07/24/14 1037    Past Medical History  Diagnosis Date  . Fibromyalgia   . Asthma   . DJD (degenerative joint disease)   . Chronic pain   . Depression   . Anxiety   . Panic attack   . Sleep disorder   . DJD (degenerative joint disease)   . GERD (gastroesophageal  reflux disease)   . Hyperlipidemia   . COPD (chronic obstructive pulmonary disease)   . On home oxygen therapy     Past Surgical History  Procedure Laterality Date  . Abdominal surgery    . Carpal tunnel release    . Ankle surgery    . Knee surgery    . Cesarean section    . Tonsillectomy    . Tubal ligation    . Back surgery      Giani Winther A. Mayford KnifeWilliams, RD, LDN Pager: [phone]

## 2014-07-25 ENCOUNTER — Inpatient Hospital Stay (HOSPITAL_COMMUNITY): Payer: Medicare Other

## 2014-07-25 DIAGNOSIS — J9622 Acute and chronic respiratory failure with hypercapnia: Secondary | ICD-10-CM | POA: Diagnosis not present

## 2014-07-25 LAB — BASIC METABOLIC PANEL
ANION GAP: 10 (ref 5–15)
BUN: 15 mg/dL (ref 6–23)
CHLORIDE: 103 meq/L (ref 96–112)
CO2: 27 mEq/L (ref 19–32)
Calcium: 8.2 mg/dL — ABNORMAL LOW (ref 8.4–10.5)
Creatinine, Ser: 0.69 mg/dL (ref 0.50–1.10)
Glucose, Bld: 146 mg/dL — ABNORMAL HIGH (ref 70–99)
POTASSIUM: 3.7 meq/L (ref 3.7–5.3)
SODIUM: 140 meq/L (ref 137–147)

## 2014-07-25 LAB — CBC
HCT: 41.9 % (ref 36.0–46.0)
Hemoglobin: 13.5 g/dL (ref 12.0–15.0)
MCH: 29.8 pg (ref 26.0–34.0)
MCHC: 32.2 g/dL (ref 30.0–36.0)
MCV: 92.5 fL (ref 78.0–100.0)
PLATELETS: 191 10*3/uL (ref 150–400)
RBC: 4.53 MIL/uL (ref 3.87–5.11)
RDW: 16.7 % — ABNORMAL HIGH (ref 11.5–15.5)
WBC: 21.5 10*3/uL — AB (ref 4.0–10.5)

## 2014-07-25 LAB — BLOOD GAS, ARTERIAL
ACID-BASE EXCESS: 2.1 mmol/L — AB (ref 0.0–2.0)
BICARBONATE: 25.8 meq/L — AB (ref 20.0–24.0)
Drawn by: 317771
FIO2: 0.4 %
MECHVT: 520 mL
O2 Saturation: 95.3 %
PEEP: 5 cmH2O
PH ART: 7.447 (ref 7.350–7.450)
Patient temperature: 37
RATE: 20 resp/min
TCO2: 22.7 mmol/L (ref 0–100)
pCO2 arterial: 37.9 mmHg (ref 35.0–45.0)
pO2, Arterial: 71.9 mmHg — ABNORMAL LOW (ref 80.0–100.0)

## 2014-07-25 LAB — TRIGLYCERIDES: Triglycerides: 73 mg/dL (ref <150)

## 2014-07-25 MED ORDER — METHYLPREDNISOLONE SODIUM SUCC 40 MG IJ SOLR
40.0000 mg | Freq: Two times a day (BID) | INTRAMUSCULAR | Status: DC
  Administered 2014-07-25 – 2014-07-28 (×7): 40 mg via INTRAVENOUS
  Filled 2014-07-25 (×7): qty 1

## 2014-07-25 NOTE — Progress Notes (Signed)
[facility] PROGRESS NOTE  Basilio Cairoenny E Calleros WUJ:[id number] DOB: 12/12/1955 DOA: 07/24/2014 PCP: Rudi HeapMOORE, DONALD, MD  Assessment/Plan: Respiratory failure requiring intubation - Pulmonology report and assisting with management. - Aspiration versus COPD exacerbation. Agree with addition of prednisone  SIRs - Patient is on chronic steroid use presumably for COPD.  - Stress steroid dosing supplied initially - Suspect patient may have aspirated given history of copious secretions.  - We'll continue current antibiotic regimen of Levaquin and vancomycin.  Active Problems:  Chronic pain disorder - We'll hold pain medication while sedated for vent management   COPD (chronic obstructive pulmonary disease) - On ventilator, pulmonology on board. - Should patient's clinical condition improved plan on placing back on home regimen   Depression - Once patient's condition improves will plan on placing back on home regimen   Fibromyalgia -Once patient's condition improves will plan on placing back on home regimen  Code Status: full Family Communication: Discussed with patient and sister at bedside. Disposition Plan: Pending continued improvement in condition   Consultants:  Pulmonologist  Procedures:  None  Antibiotics:  Levaquin  Vancomycin  HPI/Subjective: Pt is alert cystoscopy intubated. Nodding her head at me during conversation.  Objective: Filed Vitals:   07/25/14 0900  BP: 119/67  Pulse: 101  Temp:   Resp: 17    Intake/Output Summary (Last 24 hours) at 07/25/14 1040 Last data filed at 07/25/14 0600  Gross per 24 hour  Intake 2622.6 ml  Output   1175 ml  Net 1447.6 ml   Filed Weights   07/24/14 0641 07/24/14 0958 07/25/14 0500  Weight: 108.863 kg (240 lb) 106.3 kg (234 lb 5.6 oz) 105.6 kg (232 lb 12.9 oz)    Exam:   General:  Pt in nad, alert and awake  Cardiovascular: rrr, no mrg  Respiratory: on ventilator  Abdomen: no guarding, nd,  nt  Musculoskeletal: no cyanosis or clubbing   Data Reviewed: Basic Metabolic Panel:  Recent Labs Lab 07/24/14 0715 07/25/14 0537  NA 142 140  K 3.5* 3.7  CL 102 103  CO2 29 27  GLUCOSE 102* 146*  BUN 14 15  CREATININE 1.26* 0.69  CALCIUM 8.0* 8.2*  MG 1.7  --   PHOS 6.9*  --    Liver Function Tests:  Recent Labs Lab 07/24/14 0715  AST 117*  ALT 123*  ALKPHOS 117  BILITOT 0.4  PROT 6.7  ALBUMIN 3.2*   No results for input(s): LIPASE, AMYLASE in the last 168 hours. No results for input(s): AMMONIA in the last 168 hours. CBC:  Recent Labs Lab 07/24/14 0715 07/25/14 0537  WBC 12.1* 21.5*  NEUTROABS 8.6*  --   HGB 14.4 13.5  HCT 46.8* 41.9  MCV 96.5 92.5  PLT 245 191   Cardiac Enzymes:  Recent Labs Lab 07/24/14 0638  TROPONINI <0.30   BNP (last 3 results) No results for input(s): PROBNP in the last 8760 hours. CBG: No results for input(s): GLUCAP in the last 168 hours.  Recent Results (from the past 240 hour(s))  Blood culture (routine x 2)     Status: None (Preliminary result)   Collection Time: 07/24/14  7:15 AM  Result Value Ref Range Status   Specimen Description BLOOD LEFT ANTECUBITAL  Final   Special Requests BOTTLES DRAWN AEROBIC AND ANAEROBIC 6CC EACH  Final   Culture NO GROWTH 1 DAY  Final   Report Status PENDING  Incomplete  Blood culture (routine x 2)     Status: None (Preliminary result)  Collection Time: 07/24/14  8:56 AM  Result Value Ref Range Status   Specimen Description BLOOD LEFT HAND  Final   Special Requests BOTTLES DRAWN AEROBIC AND ANAEROBIC 6CC EACH  Final   Culture NO GROWTH 1 DAY  Final   Report Status PENDING  Incomplete  MRSA PCR Screening     Status: Abnormal   Collection Time: 07/24/14  9:55 AM  Result Value Ref Range Status   MRSA by PCR POSITIVE (A) NEGATIVE Final    Comment:        The GeneXpert MRSA Assay (FDA approved for NASAL specimens only), is one component of a comprehensive MRSA  colonization surveillance program. It is not intended to diagnose MRSA infection nor to guide or monitor treatment for MRSA infections. RESULT CALLED TO, READ BACK BY AND VERIFIED WITH: MCDANIEL,M. AT 1441 ON 07/24/2014 BY BAUGHAM,M.      Studies: Ct Head Wo Contrast  07/24/2014   CLINICAL DATA:  Altered mental status.  Initial encounter.  EXAM: CT HEAD WITHOUT CONTRAST  TECHNIQUE: Contiguous axial images were obtained from the base of the skull through the vertex without intravenous contrast.  COMPARISON:  11/15/2011.  FINDINGS: No mass lesion, mass effect, midline shift, hydrocephalus, hemorrhage. No territorial ischemia or acute infarction. Mastoid air cells clear. Visible paranasal sinuses clear. Calvarium appears intact. Scout images show endotracheal and enteric tubes.  IMPRESSION: Negative CT head.   Electronically Signed   By: Andreas Newport M.D.   On: 07/24/2014 07:22   Portable Chest Xray  07/25/2014   CLINICAL DATA:  Respiratory failure.  Intubated patient.  EXAM: PORTABLE CHEST - 1 VIEW  COMPARISON:  Single view of the chest 07/24/2014 and 09/29/2013.  FINDINGS: The patient's endotracheal tube has been pulled back since yesterday's examination with the tip now in good position just below the clavicular heads. NG tube courses into the stomach. Heart size is upper normal. Prominence of the pulmonary interstitium is unchanged. Subsegmental atelectasis is noted in the left lung base. There is no pneumothorax or pleural effusion.  IMPRESSION: ET tube in good position.  Cardiomegaly and vascular congestion.  Subsegmental atelectasis left lung base.   Electronically Signed   By: Drusilla Kanner M.D.   On: 07/25/2014 07:26   Dg Chest Portable 1 View  07/24/2014   CLINICAL DATA:  Endotracheal tube placement.  Altered mental status.  EXAM: PORTABLE CHEST - 1 VIEW  COMPARISON:  09/29/2013.  FINDINGS: Support apparatus: Endotracheal tube is present. The carina is difficult to visualize but  the tip of the tube appears about 7 mm from the carina. This should be withdrawn about 1 cm for better positioning. Tubing projects over the RIGHT side of the neck, presumably external to the patient.  Cardiomediastinal Silhouette: Within normal limits allowing for low volumes.  Lungs: Basilar atelectasis and low volumes.  No pneumothorax.  Effusions:  None.  Other: Gaseous distention of the stomach. Patient may benefit from enteric decompression.  IMPRESSION: 1. Endotracheal tube tip appears to be about 7 mm from the carina. Withdrawal of 1 cm and repeat chest radiograph recommended. 2. Low volume chest. 3. Tubing projecting over the RIGHT side of the neck, presumably external to the patient.   Electronically Signed   By: Andreas Newport M.D.   On: 07/24/2014 07:04    Scheduled Meds: . antiseptic oral rinse  7 mL Mouth Rinse QID  . chlorhexidine  15 mL Mouth Rinse BID  . Chlorhexidine Gluconate Cloth  6 each Topical Q0600  .  heparin  5,000 Units Subcutaneous 3 times per day  . levalbuterol  1.25 mg Nebulization 4 times per day  . levofloxacin (LEVAQUIN) IV  750 mg Intravenous Q24H  . methylPREDNISolone (SOLU-MEDROL) injection  40 mg Intravenous Q12H  . mupirocin ointment  1 application Nasal BID  . pantoprazole (PROTONIX) IV  40 mg Intravenous Q24H  . sodium chloride  3 mL Intravenous Q12H  . vancomycin  1,000 mg Intravenous Q12H   Continuous Infusions: . dextrose 5 % and 0.45 % NaCl with KCl 20 mEq/L 75 mL/hr at 07/25/14 0600  . propofol 12.5 mcg/kg/min (07/25/14 0924)     Time spent: > 35 minutes    Anda PiaVEGA, Azlee Monforte  [facility] Pager [phone]. If 7PM-7AM, please contact night-coverage at [email or web address], password [email or web address] 07/25/2014, 10:40 AM  LOS: 1 day

## 2014-07-25 NOTE — Progress Notes (Addendum)
Subjective: She remains intubated and on the ventilator. She has a lot of secretions which may keep [place] from being able to take her off. She is much more alert. She has been writing notes and she says that she was excited to see her son and that triggered a severe asthma attack.  Objective: Vital signs in last 24 hours: Temp:  [98.3 F (36.8 C)-101.6 F (38.7 C)] 98.3 F (36.8 C) (11/26 0819) Pulse Rate:  [82-122] 91 (11/26 0600) Resp:  [14-24] 20 (11/26 0600) BP: (93-134)/(49-95) 131/74 mmHg (11/26 0600) SpO2:  [93 %-100 %] 97 % (11/26 0746) FiO2 (%):  [40 %-50 %] 40 % (11/26 0747) Weight:  [105.6 kg (232 lb 12.9 oz)-106.3 kg (234 lb 5.6 oz)] 105.6 kg (232 lb 12.9 oz) (11/26 0500) Weight change: -2.563 kg (-5 lb 10.4 oz) Last BM Date:  (patient unable to recall; pt intubated and sedated)  Intake/Output from previous day: 11/25 0701 - 11/26 0700 In: 2637.6 [I.V.:1767.6; NG/GT:20; IV Piggyback:850] Out: 7824 [Urine:1075; Emesis/NG output:100]  PHYSICAL EXAM General appearance: alert, cooperative and mild distress Resp: rhonchi bilaterally Cardio: regular rate and rhythm, S1, S2 normal, no murmur, click, rub or gallop GI: soft, non-tender; bowel sounds normal; no masses,  no organomegaly Extremities: extremities normal, atraumatic, no cyanosis or edema  Lab Results:  Results for orders placed or performed during the hospital encounter of 07/24/14 (from the past 48 hour(s))  Urinalysis, Routine w reflex microscopic     Status: Abnormal   Collection Time: 07/24/14  6:35 AM  Result Value Ref Range   Color, Urine YELLOW YELLOW   APPearance CLEAR CLEAR   Specific Gravity, Urine 1.025 1.005 - 1.030   pH 6.0 5.0 - 8.0   Glucose, UA NEGATIVE NEGATIVE mg/dL   Hgb urine dipstick NEGATIVE NEGATIVE   Bilirubin Urine SMALL (A) NEGATIVE   Ketones, ur NEGATIVE NEGATIVE mg/dL   Protein, ur 30 (A) NEGATIVE mg/dL   Urobilinogen, UA 0.2 0.0 - 1.0 mg/dL   Nitrite NEGATIVE NEGATIVE    Leukocytes, UA NEGATIVE NEGATIVE  Drug screen panel, emergency     Status: Abnormal   Collection Time: 07/24/14  6:35 AM  Result Value Ref Range   Opiates POSITIVE (A) NONE DETECTED   Cocaine NONE DETECTED NONE DETECTED   Benzodiazepines POSITIVE (A) NONE DETECTED   Amphetamines NONE DETECTED NONE DETECTED   Tetrahydrocannabinol NONE DETECTED NONE DETECTED   Barbiturates NONE DETECTED NONE DETECTED    Comment:        DRUG SCREEN FOR MEDICAL PURPOSES ONLY.  IF CONFIRMATION IS NEEDED FOR ANY PURPOSE, NOTIFY LAB WITHIN 5 DAYS.        LOWEST DETECTABLE LIMITS FOR URINE DRUG SCREEN Drug Class       Cutoff (ng/mL) Amphetamine      1000 Barbiturate      200 Benzodiazepine   235 Tricyclics       361 Opiates          300 Cocaine          300 THC              50   Urine microscopic-add on     Status: None   Collection Time: 07/24/14  6:35 AM  Result Value Ref Range   Squamous Epithelial / LPF RARE RARE   WBC, UA 0-2 <3 WBC/hpf   RBC / HPF 0-2 <3 RBC/hpf  Troponin I     Status: None   Collection Time: 07/24/14  6:38 AM  Result Value Ref Range   Troponin I <0.30 <0.30 ng/mL    Comment:        Due to the release kinetics of cTnI, a negative result within the first hours of the onset of symptoms does not rule out myocardial infarction with certainty. If myocardial infarction is still suspected, repeat the test at appropriate intervals.   Blood gas, arterial ([facility] & [facility] ONLY)     Status: Abnormal   Collection Time: 07/24/14  6:53 AM  Result Value Ref Range   FIO2 0.60 %   Delivery systems VENTILATOR    Mode PRESSURE REGULATED VOLUME CONTROL    VT 500 mL   Rate 15 resp/min   Peep/cpap 5.0 cm H20   pH, Arterial 7.081 (LL) 7.350 - 7.450    Comment: CRITICAL RESULT CALLED TO, READ BACK BY AND VERIFIED WITH: J.MOLPUS,MD BY B.STOPHEL,RRT,RCP ON 07/24/14 AT 06:58.    pCO2 arterial 99.2 (HH) 35.0 - 45.0 mmHg    Comment: CRITICAL RESULT CALLED TO, READ BACK BY AND VERIFIED  WITH: J.MOLPUS,MD BY B.STOPHEL,RRT,RCP ON 07/24/14 AT 06:58.    pO2, Arterial 196.0 (H) 80.0 - 100.0 mmHg   Bicarbonate 28.1 (H) 20.0 - 24.0 mEq/L   TCO2 27.2 0 - 100 mmol/L   Acid-base deficit 1.1 0.0 - 2.0 mmol/L   O2 Saturation 98.0 %   Patient temperature 37.0    Collection site RIGHT RADIAL    Drawn by [phone]    Sample type ARTERIAL DRAW    Allens test (pass/fail) PASS PASS  CBC with Differential     Status: Abnormal   Collection Time: 07/24/14  7:15 AM  Result Value Ref Range   WBC 12.1 (H) 4.0 - 10.5 K/uL    Comment: CALLED CORRECTED RESULTS TO DR. Jenny Reichmann MOLPUS AT 0805 BY HUFFINES,S ON 07/24/14 CORRECTED ON 11/25 AT 0805: PREVIOUSLY REPORTED AS 5.4    RBC 4.85 3.87 - 5.11 MIL/uL    Comment: CORRECTED ON 11/25 AT 0805: PREVIOUSLY REPORTED AS 2.31   Hemoglobin 14.4 12.0 - 15.0 g/dL   HCT 46.8 (H) 36.0 - 46.0 %    Comment: CORRECTED ON 11/25 AT 0805: PREVIOUSLY REPORTED AS 22.3   MCV 96.5 78.0 - 100.0 fL   MCH 29.9 26.0 - 34.0 pg   MCHC 29.7 (L) 30.0 - 36.0 g/dL    Comment: CORRECTED ON 11/25 AT 0805: PREVIOUSLY REPORTED AS 30.9   RDW 16.8 (H) 11.5 - 15.5 %    Comment: CORRECTED ON 11/25 AT 0805: PREVIOUSLY REPORTED AS 17.0   Platelets 245 150 - 400 K/uL    Comment: CORRECTED ON 11/25 AT 0805: PREVIOUSLY REPORTED AS 120   Neutrophils Relative % 71 43 - 77 %    Comment: CORRECTED ON 11/25 AT 0805: PREVIOUSLY REPORTED AS 57   Neutro Abs 8.6 (H) 1.7 - 7.7 K/uL    Comment: CORRECTED ON 11/25 AT 0805: PREVIOUSLY REPORTED AS 3.1   Lymphocytes Relative 21 12 - 46 %    Comment: CORRECTED ON 11/25 AT 0805: PREVIOUSLY REPORTED AS 33   Lymphs Abs 2.5 0.7 - 4.0 K/uL    Comment: CORRECTED ON 11/25 AT 0805: PREVIOUSLY REPORTED AS 1.8   Monocytes Relative 6 3 - 12 %    Comment: CORRECTED ON 11/25 AT 0805: PREVIOUSLY REPORTED AS 7   Monocytes Absolute 0.7 0.1 - 1.0 K/uL    Comment: CORRECTED ON 11/25 AT 0805: PREVIOUSLY REPORTED AS 0.4   Eosinophils Relative 2 0 - 5 %  Comment:  CORRECTED ON 11/25 AT 0805: PREVIOUSLY REPORTED AS 3   Eosinophils Absolute 0.2 0.0 - 0.7 K/uL    Comment: CORRECTED ON 11/25 AT 0805: PREVIOUSLY REPORTED AS 0.1   Basophils Relative 0 0 - 1 %   Basophils Absolute 0.0 0.0 - 0.1 K/uL  Comprehensive metabolic panel     Status: Abnormal   Collection Time: 07/24/14  7:15 AM  Result Value Ref Range   Sodium 142 137 - 147 mEq/L   Potassium 3.5 (L) 3.7 - 5.3 mEq/L   Chloride 102 96 - 112 mEq/L   CO2 29 19 - 32 mEq/L   Glucose, Bld 102 (H) 70 - 99 mg/dL   BUN 14 6 - 23 mg/dL   Creatinine, Ser 1.26 (H) 0.50 - 1.10 mg/dL   Calcium 8.0 (L) 8.4 - 10.5 mg/dL   Total Protein 6.7 6.0 - 8.3 g/dL   Albumin 3.2 (L) 3.5 - 5.2 g/dL   AST 117 (H) 0 - 37 U/L   ALT 123 (H) 0 - 35 U/L   Alkaline Phosphatase 117 39 - 117 U/L   Total Bilirubin 0.4 0.3 - 1.2 mg/dL   GFR calc non Af Amer 46 (L) >90 mL/min   GFR calc Af Amer 54 (L) >90 mL/min    Comment: (NOTE) The eGFR has been calculated using the CKD EPI equation. This calculation has not been validated in all clinical situations. eGFR's persistently <90 mL/min signify possible Chronic Kidney Disease.    Anion gap 11 5 - 15  Ethanol     Status: None   Collection Time: 07/24/14  7:15 AM  Result Value Ref Range   Alcohol, Ethyl (B) <11 0 - 11 mg/dL    Comment:        LOWEST DETECTABLE LIMIT FOR SERUM ALCOHOL IS 11 mg/dL FOR MEDICAL PURPOSES ONLY   Salicylate level     Status: Abnormal   Collection Time: 07/24/14  7:15 AM  Result Value Ref Range   Salicylate Lvl <3.0 (L) 2.8 - 20.0 mg/dL  Acetaminophen level     Status: None   Collection Time: 07/24/14  7:15 AM  Result Value Ref Range   Acetaminophen (Tylenol), Serum <15.0 10 - 30 ug/mL  Blood culture (routine x 2)     Status: None (Preliminary result)   Collection Time: 07/24/14  7:15 AM  Result Value Ref Range   Specimen Description BLOOD LEFT ANTECUBITAL    Special Requests BOTTLES DRAWN AEROBIC AND ANAEROBIC 6CC EACH    Culture NO GROWTH  1 DAY    Report Status PENDING   Magnesium     Status: None   Collection Time: 07/24/14  7:15 AM  Result Value Ref Range   Magnesium 1.7 1.5 - 2.5 mg/dL  Phosphorus     Status: Abnormal   Collection Time: 07/24/14  7:15 AM  Result Value Ref Range   Phosphorus 6.9 (H) 2.3 - 4.6 mg/dL  Blood culture (routine x 2)     Status: None (Preliminary result)   Collection Time: 07/24/14  8:56 AM  Result Value Ref Range   Specimen Description BLOOD LEFT HAND    Special Requests BOTTLES DRAWN AEROBIC AND ANAEROBIC 6CC EACH    Culture NO GROWTH 1 DAY    Report Status PENDING   I-Stat CG4 Lactic Acid, ED     Status: None   Collection Time: 07/24/14  8:59 AM  Result Value Ref Range   Lactic Acid, Venous 1.43 0.5 - 2.2 mmol/L  MRSA PCR Screening     Status: Abnormal   Collection Time: 07/24/14  9:55 AM  Result Value Ref Range   MRSA by PCR POSITIVE (A) NEGATIVE    Comment:        The GeneXpert MRSA Assay (FDA approved for NASAL specimens only), is one component of a comprehensive MRSA colonization surveillance program. It is not intended to diagnose MRSA infection nor to guide or monitor treatment for MRSA infections. RESULT CALLED TO, READ BACK BY AND VERIFIED WITH: MCDANIEL,M. AT 1441 ON 07/24/2014 BY BAUGHAM,M.   Blood gas, arterial     Status: Abnormal   Collection Time: 07/24/14 10:15 AM  Result Value Ref Range   FIO2 50.00 %   Delivery systems VENTILATOR    Mode PRESSURE REGULATED VOLUME CONTROL    VT 500 mL   Rate 18 resp/min   Peep/cpap 5.0 cm H20   pH, Arterial 7.199 (LL) 7.350 - 7.450    Comment: CRITICAL RESULT CALLED TO, READ BACK BY AND VERIFIED WITH: HILTON,L.RN AT 1032 07/24/14 BY BROADNAX,L.RRT    pCO2 arterial 68.0 (HH) 35.0 - 45.0 mmHg    Comment: CRITICAL RESULT CALLED TO, READ BACK BY AND VERIFIED WITH: HILTON,L.RN AT 1033 07/24/14 BY BROADNAX,L.RRT    pO2, Arterial 89.2 80.0 - 100.0 mmHg   Bicarbonate 25.5 (H) 20.0 - 24.0 mEq/L   TCO2 23.3 0 - 100 mmol/L    Acid-base deficit 1.7 0.0 - 2.0 mmol/L   O2 Saturation 95.1 %   Patient temperature 37.0    Collection site LEFT RADIAL    Drawn by [id number]    Sample type ARTERIAL    Allens test (pass/fail) PASS PASS  Blood gas, arterial     Status: Abnormal   Collection Time: 07/24/14  1:30 PM  Result Value Ref Range   FIO2 50.00 %   Delivery systems VENTILATOR    Mode PRESSURE REGULATED VOLUME CONTROL    VT 520 mL   Rate 20 resp/min   Peep/cpap 5.0 cm H20   pH, Arterial 7.366 7.350 - 7.450   pCO2 arterial 44.5 35.0 - 45.0 mmHg   pO2, Arterial 73.8 (L) 80.0 - 100.0 mmHg   Bicarbonate 24.9 (H) 20.0 - 24.0 mEq/L   TCO2 22.0 0 - 100 mmol/L   Acid-Base Excess 0.2 0.0 - 2.0 mmol/L   O2 Saturation 95.3 %   Patient temperature 37.0    Collection site RIGHT RADIAL    Drawn by [id number]    Sample type ARTERIAL    Allens test (pass/fail) PASS PASS  Blood gas, arterial     Status: Abnormal   Collection Time: 07/25/14  5:00 AM  Result Value Ref Range   FIO2 0.40 %   Delivery systems VENTILATOR    Mode PRESSURE REGULATED VOLUME CONTROL    VT 520 mL   Rate 20.0 resp/min   Peep/cpap 5.0 cm H20   pH, Arterial 7.447 7.350 - 7.450   pCO2 arterial 37.9 35.0 - 45.0 mmHg   pO2, Arterial 71.9 (L) 80.0 - 100.0 mmHg   Bicarbonate 25.8 (H) 20.0 - 24.0 mEq/L   TCO2 22.7 0 - 100 mmol/L   Acid-Base Excess 2.1 (H) 0.0 - 2.0 mmol/L   O2 Saturation 95.3 %   Patient temperature 37.0    Collection site RIGHT RADIAL    Drawn by [id number]    Sample type ARTERIAL DRAW    Allens test (pass/fail) PASS PASS  Basic metabolic panel     Status: Abnormal   Collection  Time: 07/25/14  5:37 AM  Result Value Ref Range   Sodium 140 137 - 147 mEq/L   Potassium 3.7 3.7 - 5.3 mEq/L   Chloride 103 96 - 112 mEq/L   CO2 27 19 - 32 mEq/L   Glucose, Bld 146 (H) 70 - 99 mg/dL   BUN 15 6 - 23 mg/dL   Creatinine, Ser 0.69 0.50 - 1.10 mg/dL    Comment: DELTA CHECK NOTED   Calcium 8.2 (L) 8.4 - 10.5 mg/dL   GFR calc non Af Amer >90  >90 mL/min   GFR calc Af Amer >90 >90 mL/min    Comment: (NOTE) The eGFR has been calculated using the CKD EPI equation. This calculation has not been validated in all clinical situations. eGFR's persistently <90 mL/min signify possible Chronic Kidney Disease.    Anion gap 10 5 - 15  CBC     Status: Abnormal   Collection Time: 07/25/14  5:37 AM  Result Value Ref Range   WBC 21.5 (H) 4.0 - 10.5 K/uL   RBC 4.53 3.87 - 5.11 MIL/uL   Hemoglobin 13.5 12.0 - 15.0 g/dL   HCT 41.9 36.0 - 46.0 %   MCV 92.5 78.0 - 100.0 fL   MCH 29.8 26.0 - 34.0 pg   MCHC 32.2 30.0 - 36.0 g/dL   RDW 16.7 (H) 11.5 - 15.5 %   Platelets 191 150 - 400 K/uL  Triglycerides     Status: None   Collection Time: 07/25/14  5:37 AM  Result Value Ref Range   Triglycerides 73 <150 mg/dL    ABGS  Recent Labs  07/25/14 0500  PHART 7.447  PO2ART 71.9*  TCO2 22.7  HCO3 25.8*   CULTURES Recent Results (from the past 240 hour(s))  Blood culture (routine x 2)     Status: None (Preliminary result)   Collection Time: 07/24/14  7:15 AM  Result Value Ref Range Status   Specimen Description BLOOD LEFT ANTECUBITAL  Final   Special Requests BOTTLES DRAWN AEROBIC AND ANAEROBIC 6CC EACH  Final   Culture NO GROWTH 1 DAY  Final   Report Status PENDING  Incomplete  Blood culture (routine x 2)     Status: None (Preliminary result)   Collection Time: 07/24/14  8:56 AM  Result Value Ref Range Status   Specimen Description BLOOD LEFT HAND  Final   Special Requests BOTTLES DRAWN AEROBIC AND ANAEROBIC 6CC EACH  Final   Culture NO GROWTH 1 DAY  Final   Report Status PENDING  Incomplete  MRSA PCR Screening     Status: Abnormal   Collection Time: 07/24/14  9:55 AM  Result Value Ref Range Status   MRSA by PCR POSITIVE (A) NEGATIVE Final    Comment:        The GeneXpert MRSA Assay (FDA approved for NASAL specimens only), is one component of a comprehensive MRSA colonization surveillance program. It is not intended to  diagnose MRSA infection nor to guide or monitor treatment for MRSA infections. RESULT CALLED TO, READ BACK BY AND VERIFIED WITH: MCDANIEL,M. AT 1441 ON 07/24/2014 BY BAUGHAM,M.    Studies/Results: Ct Head Wo Contrast  07/24/2014   CLINICAL DATA:  Altered mental status.  Initial encounter.  EXAM: CT HEAD WITHOUT CONTRAST  TECHNIQUE: Contiguous axial images were obtained from the base of the skull through the vertex without intravenous contrast.  COMPARISON:  11/15/2011.  FINDINGS: No mass lesion, mass effect, midline shift, hydrocephalus, hemorrhage. No territorial ischemia or acute infarction. Mastoid  air cells clear. Visible paranasal sinuses clear. Calvarium appears intact. Scout images show endotracheal and enteric tubes.  IMPRESSION: Negative CT head.   Electronically Signed   By: Dereck Ligas M.D.   On: 07/24/2014 07:22   Portable Chest Xray  07/25/2014   CLINICAL DATA:  Respiratory failure.  Intubated patient.  EXAM: PORTABLE CHEST - 1 VIEW  COMPARISON:  Single view of the chest 07/24/2014 and 09/29/2013.  FINDINGS: The patient's endotracheal tube has been pulled back since yesterday's examination with the tip now in good position just below the clavicular heads. NG tube courses into the stomach. Heart size is upper normal. Prominence of the pulmonary interstitium is unchanged. Subsegmental atelectasis is noted in the left lung base. There is no pneumothorax or pleural effusion.  IMPRESSION: ET tube in good position.  Cardiomegaly and vascular congestion.  Subsegmental atelectasis left lung base.   Electronically Signed   By: Inge Rise M.D.   On: 07/25/2014 07:26   Dg Chest Portable 1 View  07/24/2014   CLINICAL DATA:  Endotracheal tube placement.  Altered mental status.  EXAM: PORTABLE CHEST - 1 VIEW  COMPARISON:  09/29/2013.  FINDINGS: Support apparatus: Endotracheal tube is present. The carina is difficult to visualize but the tip of the tube appears about 7 mm from the carina.  This should be withdrawn about 1 cm for better positioning. Tubing projects over the RIGHT side of the neck, presumably external to the patient.  Cardiomediastinal Silhouette: Within normal limits allowing for low volumes.  Lungs: Basilar atelectasis and low volumes.  No pneumothorax.  Effusions:  None.  Other: Gaseous distention of the stomach. Patient may benefit from enteric decompression.  IMPRESSION: 1. Endotracheal tube tip appears to be about 7 mm from the carina. Withdrawal of 1 cm and repeat chest radiograph recommended. 2. Low volume chest. 3. Tubing projecting over the RIGHT side of the neck, presumably external to the patient.   Electronically Signed   By: Dereck Ligas M.D.   On: 07/24/2014 07:04    Medications:  Prior to Admission:  Prescriptions prior to admission  Medication Sig Dispense Refill Last Dose  . albuterol (PROVENTIL) (2.5 MG/3ML) 0.083% nebulizer solution Take 3 mLs (2.5 mg total) by nebulization every 6 (six) hours as needed for wheezing or shortness of breath. 75 mL 3 Taking  . benzonatate (TESSALON) 100 MG capsule TAKE 1 CAPSULE (100 MG TOTAL) BY MOUTH 2 (TWO) TIMES DAILY AS NEEDED FOR COUGH. 20 capsule 0   . cyclobenzaprine (FLEXERIL) 10 MG tablet TAKE 1 TABLET (10 MG TOTAL) BY MOUTH 3 (THREE) TIMES DAILY AS NEEDED FOR MUSCLE SPASMS. 90 tablet 2   . diazepam (VALIUM) 5 MG tablet TAKE 1 TABLET BY MOUTH EVERY 8 HOURS 90 tablet 0   . diclofenac (VOLTAREN) 75 MG EC tablet Take 75 mg by mouth 2 (two) times daily.     . DULoxetine (CYMBALTA) 60 MG capsule TAKE ONE CAPSULE BY MOUTH EVERY DAY 30 capsule 0   . fluticasone (FLONASE) 50 MCG/ACT nasal spray PLACE 2 SPRAYS INTO THE NOSE DAILY. 16 g 1   . HYDROmorphone HCl (EXALGO) 12 MG T24A SR tablet Take 12 mg by mouth daily.     Marland Kitchen ibuprofen (ADVIL,MOTRIN) 800 MG tablet TAKE 1 TABLET (800 MG TOTAL) BY MOUTH EVERY 6 (SIX) HOURS AS NEEDED FOR PAIN. 90 tablet 0   . levalbuterol (XOPENEX HFA) 45 MCG/ACT inhaler Inhale 2 puffs into  the lungs every 4 (four) hours as needed for wheezing or  shortness of breath.   Taking  . Naloxone HCl (EVZIO) 0.4 MG/0.4ML SOAJ Inject as directed.     Marland Kitchen PROAIR HFA 108 (90 BASE) MCG/ACT inhaler INHALE 2 PUFFS INTO THE LUNGS EVERY SIX HOURS AS NEEDED FOR WHEEZING OR SHORTNESS OF BREATH 8.5 each 2   . traMADol (ULTRAM) 50 MG tablet Take 50 mg by mouth every 6 (six) hours as needed. pain   Taking  . atorvastatin (LIPITOR) 20 MG tablet Take 1 tablet (20 mg total) by mouth daily. 90 tablet 0 Taking  . buPROPion (WELLBUTRIN XL) 150 MG 24 hr tablet TAKE 1 TABLET (150 MG TOTAL) BY MOUTH EVERY MORNING. 90 tablet 0   . Fluticasone Furoate-Vilanterol 100-25 MCG/INH AEPB Inhale 1 puff into the lungs daily. 14 each 0   . furosemide (LASIX) 40 MG tablet Take 1 tablet (40 mg total) by mouth daily. 90 tablet 0 Taking  . gabapentin (NEURONTIN) 300 MG capsule Take 300 mg by mouth 3 (three) times daily.    Taking  . montelukast (SINGULAIR) 10 MG tablet Take 1 tablet (10 mg total) by mouth at bedtime. 90 tablet 1 Taking  . omeprazole (PRILOSEC) 20 MG capsule Take 1 capsule (20 mg total) by mouth daily. 90 capsule 1 Taking  . PATANOL 0.1 % ophthalmic solution PLACE 1 DROP INTO BOTH EYES 2 (TWO) TIMES DAILY. 15 mL 0   . tiotropium (SPIRIVA HANDIHALER) 18 MCG inhalation capsule Place 1 capsule (18 mcg total) into inhaler and inhale daily. 30 capsule 12 Taking   Scheduled: . antiseptic oral rinse  7 mL Mouth Rinse QID  . chlorhexidine  15 mL Mouth Rinse BID  . Chlorhexidine Gluconate Cloth  6 each Topical Q0600  . heparin  5,000 Units Subcutaneous 3 times per day  . levalbuterol  1.25 mg Nebulization 4 times per day  . levofloxacin (LEVAQUIN) IV  750 mg Intravenous Q24H  . mupirocin ointment  1 application Nasal BID  . pantoprazole (PROTONIX) IV  40 mg Intravenous Q24H  . sodium chloride  3 mL Intravenous Q12H  . vancomycin  1,000 mg Intravenous Q12H   Continuous: . dextrose 5 % and 0.45 % NaCl with KCl 20  mEq/L 75 mL/hr at 07/25/14 0600  . propofol 25 mcg/kg/min (07/25/14 3729)   MSX:[id number], fentaNYL  Assesment: She was admitted with acute respiratory failure requiring intubation and mechanical ventilation. She has what is probably aspiration and chest x-ray shows an area of atelectasis versus infiltrate likely from aspiration. She has thick secretions but is much more alert so I think we can see if she is ready for extubation. The secretions may be the problem. Active Problems:   Chronic pain disorder   COPD (chronic obstructive pulmonary disease)   Depression   Fibromyalgia   Acute-on-chronic respiratory failure   Respiratory failure requiring intubation   SIRS (systemic inflammatory response syndrome)    Plan: Try weaning today and see how she does. Since she has a diagnosis of COPD listed low dose IV steroids may help some with her secretions and I have added Solu-Medrol 40 mg IV every 12 hours    LOS: 1 day   Bethany Villa L 07/25/2014, 9:22 AM

## 2014-07-26 LAB — BASIC METABOLIC PANEL
ANION GAP: 10 (ref 5–15)
BUN: 13 mg/dL (ref 6–23)
CO2: 26 meq/L (ref 19–32)
Calcium: 8.5 mg/dL (ref 8.4–10.5)
Chloride: 102 mEq/L (ref 96–112)
Creatinine, Ser: 0.63 mg/dL (ref 0.50–1.10)
GFR calc Af Amer: >90 mL/min (ref >90)
GFR calc non Af Amer: >90 mL/min (ref >90)
Glucose, Bld: 145 mg/dL — ABNORMAL HIGH (ref 70–99)
Potassium: 3.9 mEq/L (ref 3.7–5.3)
SODIUM: 138 meq/L (ref 137–147)

## 2014-07-26 LAB — BLOOD GAS, ARTERIAL
Acid-Base Excess: 3.8 mmol/L — ABNORMAL HIGH (ref 0.0–2.0)
Bicarbonate: 27.3 mEq/L — ABNORMAL HIGH (ref 20.0–24.0)
DRAWN BY: 22223
FIO2: 40 %
LHR: 20 {breaths}/min
O2 SAT: 89.6 %
PCO2 ART: 37.9 mmHg (ref 35.0–45.0)
PEEP: 5 cmH2O
Patient temperature: 37
TCO2: 24 mmol/L (ref 0–100)
VT: 520 mL
pH, Arterial: 7.471 — ABNORMAL HIGH (ref 7.350–7.450)
pO2, Arterial: 55.6 mmHg — ABNORMAL LOW (ref 80.0–100.0)

## 2014-07-26 LAB — VANCOMYCIN, TROUGH: Vancomycin Tr: 11.7 ug/mL (ref 10.0–20.0)

## 2014-07-26 MED ORDER — VANCOMYCIN HCL 10 G IV SOLR
1250.0000 mg | Freq: Two times a day (BID) | INTRAVENOUS | Status: DC
  Administered 2014-07-26 – 2014-07-27 (×2): 1250 mg via INTRAVENOUS
  Filled 2014-07-26 (×6): qty 1250

## 2014-07-26 MED ORDER — CHLORHEXIDINE GLUCONATE 0.12 % MT SOLN: 15.0000 mL | Freq: Two times a day (BID) | OROMUCOSAL | Status: DC

## 2014-07-26 MED ORDER — CETYLPYRIDINIUM CHLORIDE 0.05 % MT LIQD
7.0000 mL | Freq: Two times a day (BID) | OROMUCOSAL | Status: DC
Start: 2014-07-26 — End: 2014-07-26

## 2014-07-26 MED ORDER — CETYLPYRIDINIUM CHLORIDE 0.05 % MT LIQD
7.0000 mL | Freq: Two times a day (BID) | OROMUCOSAL | Status: DC
  Administered 2014-07-26 – 2014-07-28 (×4): 7 mL via OROMUCOSAL

## 2014-07-26 MED ORDER — TRAMADOL HCL 50 MG PO TABS
50.0000 mg | ORAL_TABLET | Freq: Four times a day (QID) | ORAL | Status: DC | PRN
  Administered 2014-07-26: 50 mg via ORAL
  Filled 2014-07-26: qty 1

## 2014-07-26 MED ORDER — TRAMADOL HCL 50 MG PO TABS
50.0000 mg | ORAL_TABLET | Freq: Four times a day (QID) | ORAL | Status: DC | PRN
  Administered 2014-07-26: 50 mg via ORAL
  Administered 2014-07-26 – 2014-07-28 (×7): 100 mg via ORAL
  Filled 2014-07-26 (×2): qty 2
  Filled 2014-07-26: qty 1
  Filled 2014-07-26 (×5): qty 2

## 2014-07-26 NOTE — Care Management Note (Signed)
    Page 1 of 1   07/26/2014     10:53:19 AM CARE MANAGEMENT NOTE 07/26/2014  Patient:  Bethany Villa   Account Number:  [id number]  Date Initiated:  07/26/2014  Documentation initiated by:  Kathyrn SheriffHILDRESS,JESSICA  Subjective/Objective Assessment:   Pt is from home with self care. Pt has home O2 through [facility]. Pt has no medication needs or HH serivces but has used [facility] in the past and would like them again if they are needed. Pt's PCP is Gennette PacMary Margaret.     Action/Plan:   Pt plans to discharge home with self care. Will continue to follow for CM needs, PT consult?   Anticipated DC Date:  07/29/2014   Anticipated DC Plan:  HOME/SELF CARE      DC Planning Services  CM consult      Choice offered to / List presented to:             Status of service:  In process, will continue to follow Medicare Important Message given?  YES (If response is "NO", the following Medicare IM given date fields will be blank) Date Medicare IM given:  07/26/2014 Medicare IM given by:  Kathyrn SheriffHILDRESS,JESSICA Date Additional Medicare IM given:   Additional Medicare IM given by:    Discharge Disposition:  HOME/SELF CARE  Per UR Regulation:    If discussed at Long Length of Stay Meetings, dates discussed:    Comments:  07/26/2014 1030 Kathyrn SheriffJessica Childress, RN, MSN, [facility]

## 2014-07-26 NOTE — Progress Notes (Signed)
ANTIBIOTIC CONSULT NOTE  Pharmacy Consult for Vancomycin  Indication: rule out sepsis  Allergies  Allergen Reactions  . Mobic [Meloxicam] Anaphylaxis  . Penicillins Shortness Of Breath and Swelling  . Sulfa Antibiotics Shortness Of Breath and Swelling    Patient Measurements: Height: 5\' 6"  (167.6 cm) Weight: 234 lb 2.1 oz (106.2 kg) IBW/kg (Calculated) : 59.3  Vital Signs: Temp: 98.1 F (36.7 C) (11/27 0738) Temp Source: Core (Comment) (11/27 0738) BP: 137/84 mmHg (11/27 1100) Pulse Rate: 88 (11/27 1100) Intake/Output from previous day: 11/26 0701 - 11/27 0700 In: 2431.6 [I.V.:2011.6; NG/GT:20; IV Piggyback:400] Out: 1100 [Urine:1100] Intake/Output from this shift: Total I/O In: 15.3 [I.V.:15.3] Out: -   Labs:  Recent Labs  07/24/14 0715 07/25/14 0537 07/26/14 0440  WBC 12.1* 21.5*  --   HGB 14.4 13.5  --   PLT 245 191  --   CREATININE 1.26* 0.69 0.63   Estimated Creatinine Clearance: 95.7 mL/min (by C-G formula based on Cr of 0.63).  Recent Labs  07/26/14 1000  VANCOTROUGH 11.7     Microbiology: Recent Results (from the past 720 hour(s))  Blood culture (routine x 2)     Status: None (Preliminary result)   Collection Time: 07/24/14  7:15 AM  Result Value Ref Range Status   Specimen Description BLOOD LEFT ANTECUBITAL  Final   Special Requests BOTTLES DRAWN AEROBIC AND ANAEROBIC 6CC EACH  Final   Culture NO GROWTH 1 DAY  Final   Report Status PENDING  Incomplete  Blood culture (routine x 2)     Status: None (Preliminary result)   Collection Time: 07/24/14  8:56 AM  Result Value Ref Range Status   Specimen Description BLOOD LEFT HAND  Final   Special Requests BOTTLES DRAWN AEROBIC AND ANAEROBIC 6CC EACH  Final   Culture NO GROWTH 1 DAY  Final   Report Status PENDING  Incomplete  MRSA PCR Screening     Status: Abnormal   Collection Time: 07/24/14  9:55 AM  Result Value Ref Range Status   MRSA by PCR POSITIVE (A) NEGATIVE Final    Comment:         The GeneXpert MRSA Assay (FDA approved for NASAL specimens only), is one component of a comprehensive MRSA colonization surveillance program. It is not intended to diagnose MRSA infection nor to guide or monitor treatment for MRSA infections. RESULT CALLED TO, READ BACK BY AND VERIFIED WITH: MCDANIEL,M. AT 1441 ON 07/24/2014 BY BAUGHAM,M.     Anti-infectives    Start     Dose/Rate Route Frequency Ordered Stop   07/26/14 1800  vancomycin (VANCOCIN) 1,250 mg in sodium chloride 0.9 % 250 mL IVPB     1,250 mg166.7 mL/hr over 90 Minutes Intravenous Every 12 hours 07/26/14 1200     07/24/14 2359  vancomycin (VANCOCIN) IVPB 1000 mg/200 mL premix  Status:  Discontinued     1,000 mg200 mL/hr over 60 Minutes Intravenous Every 12 hours 07/24/14 1036 07/24/14 1621   07/24/14 2300  vancomycin (VANCOCIN) IVPB 1000 mg/200 mL premix  Status:  Discontinued     1,000 mg200 mL/hr over 60 Minutes Intravenous Every 12 hours 07/24/14 1621 07/26/14 1200   07/24/14 1200  levofloxacin (LEVAQUIN) IVPB 750 mg     750 mg100 mL/hr over 90 Minutes Intravenous Every 24 hours 07/24/14 1105     07/24/14 1100  vancomycin (VANCOCIN) 2,000 mg in sodium chloride 0.9 % 500 mL IVPB     2,000 mg250 mL/hr over 120 Minutes Intravenous  Once  07/24/14 1033 07/24/14 1342      Assessment: 58 yo obese F found unresponsive by son & brought to ED.  She was in acute respiratory distress en route and intubated. PCN allergy noted = SOB therefore Cefepime was changed to Levaquin for presumed COPD exacerbation. Patient extubated  Vancomycin trough below goal  Vancomycin 11/25>> Levaquin 11/25>>  Goal of Therapy:  Vancomycin trough level 15-20 mcg/ml  Plan:  Change Vancomycin to 1250 mg IV every 12 hours Vancomycin trough at steady state Continue Levaquin 750mg  IV q24h Monitor renal function and cx data  Duration of therapy per MD  Raquel JamesPittman, Yarelin Reichardt Bennett 07/26/2014,12:03 PM

## 2014-07-26 NOTE — Progress Notes (Signed)
Nutrition Follow up   INTERVENTION:  Advance to Soft / CHO Modified diet as tolerated  NUTRITION DIAGNOSIS: Inadequate oral intake related to inability to eat as evidenced by NPO status.   Goal: Pt to meet >/= 90% of their estimated nutrition needs    Monitor: Diet advancement, weight changes, labs   Hx: Bethany Villa is a 58 y.o. female with past medical history listed below. Presented to the hospital after son found patient with agonal breathing on the field. Reportedly in the field patient had some Narcan which the patient became more alert. His required intubation but after Narcan was extubated reportedly patient still had an required suctioning. Once she arrived in the ED was having difficulty breathing and was intubated again.   ASSESSMENT: Pt extubated this morning. Propofol was stopped. She is c/o being hungry and thirsty. Feeds herself. She denies recent changes in appetite or weight status and is requesting diet advancement. Re-assessed nutrition needs with change in status.  IV fluids -D5 @ 75 ml/hr Abnormal labs: Glucose 145 mg/dl.  Height: Ht Readings from Last 1 Encounters:  07/25/14 5\' 6"  (1.676 m)    Weight: Wt Readings from Last 1 Encounters:  07/26/14 234 lb 2.1 oz (106.2 kg)    Ideal Body Weight: 130#   Wt Readings from Last 10 Encounters:  07/26/14 234 lb 2.1 oz (106.2 kg)  10/10/13 226 lb (102.513 kg)  10/01/13 225 lb 12 oz (102.4 kg)  09/18/13 221 lb (100.245 kg)  07/24/13 213 lb (96.616 kg)  05/08/13 227 lb 4.7 oz (103.1 kg)  02/06/13 234 lb (106.142 kg)  12/28/12 230 lb (104.327 kg)  12/02/12 234 lb 9.1 oz (106.4 kg)  07/15/12 200 lb (90.719 kg)    Usual Body Weight: 230#  BMI:  Body mass index is 37.81 kg/(m^2). Obesity, class II  Estimated Nutritional Needs: Kcal: 1300-1475 Protein: > 118 grams Fluid: >1.5 L  Skin: Intact  Diet Order: Diet NPO time specified Except for: Ice Chips  EDUCATION NEEDS: -Education not appropriate at  this time   Intake/Output Summary (Last 24 hours) at 07/26/14 1138 Last data filed at 07/26/14 0820  Gross per 24 hour  Intake 2446.89 ml  Output   1100 ml  Net 1346.89 ml    Last BM: PTA  Labs:   Recent Labs Lab 07/24/14 0715 07/25/14 0537 07/26/14 0440  NA 142 140 138  K 3.5* 3.7 3.9  CL 102 103 102  CO2 29 27 26   BUN 14 15 13   CREATININE 1.26* 0.69 0.63  CALCIUM 8.0* 8.2* 8.5  MG 1.7  --   --   PHOS 6.9*  --   --   GLUCOSE 102* 146* 145*    CBG (last 3)  No results for input(s): GLUCAP in the last 72 hours.  Scheduled Meds: . antiseptic oral rinse  7 mL Mouth Rinse QID  . antiseptic oral rinse  7 mL Mouth Rinse q12n4p  . chlorhexidine  15 mL Mouth Rinse BID  . chlorhexidine  15 mL Mouth Rinse BID  . Chlorhexidine Gluconate Cloth  6 each Topical Q0600  . heparin  5,000 Units Subcutaneous 3 times per day  . levalbuterol  1.25 mg Nebulization 4 times per day  . levofloxacin (LEVAQUIN) IV  750 mg Intravenous Q24H  . methylPREDNISolone (SOLU-MEDROL) injection  40 mg Intravenous Q12H  . mupirocin ointment  1 application Nasal BID  . pantoprazole (PROTONIX) IV  40 mg Intravenous Q24H  . sodium chloride  3 mL  Intravenous Q12H  . vancomycin  1,000 mg Intravenous Q12H    Continuous Infusions: . dextrose 5 % and 0.45 % NaCl with KCl 20 mEq/L 75 mL/hr at 07/26/14 0600  . propofol Stopped (07/26/14 0820)    Past Medical History  Diagnosis Date  . Fibromyalgia   . Asthma   . DJD (degenerative joint disease)   . Chronic pain   . Depression   . Anxiety   . Panic attack   . Sleep disorder   . DJD (degenerative joint disease)   . GERD (gastroesophageal reflux disease)   . Hyperlipidemia   . COPD (chronic obstructive pulmonary disease)   . On home oxygen therapy     Past Surgical History  Procedure Laterality Date  . Abdominal surgery    . Carpal tunnel release    . Ankle surgery    . Knee surgery    . Cesarean section    . Tonsillectomy    . Tubal  ligation    . Back surgery     Royann ShiversLynn Tongela Encinas MS,RD,CSG,LDN Office: #[phone] Pager: [phone]

## 2014-07-26 NOTE — Progress Notes (Signed)
[facility] PROGRESS NOTE  Bethany Villa ZOX:[id number] DOB: 09/30/1955 DOA: 07/24/2014 PCP: Bethany HeapMOORE, DONALD, MD  Assessment/Plan: Respiratory failure requiring intubation - Pulmonology assisting with management. - Aspiration versus COPD exacerbation.  - Pt on solumedrol 40 mg IV q 12 hr  SIRs - Stress steroid dosing supplied initially, given that patient was on chronic steroids at home. - Suspect patient may have aspirated given history of copious secretions.  - We'll continue current antibiotic regimen of Levaquin and vancomycin.  Active Problems:  Chronic pain disorder - We'll hold pain medication while sedated for vent management    COPD (chronic obstructive pulmonary disease) - On ventilator, pulmonology on board. - Should patient's clinical condition improve plan on placing back on home regimen   Depression - Once patient's condition improves will plan on placing back on home regimen   Fibromyalgia -Once patient's condition improves will plan on placing back on home regimen  Code Status: full Family Communication: Discussed with patient and sister at bedside. Disposition Plan: Pending continued improvement in condition   Consultants:  Pulmonologist  Procedures:  None  Antibiotics:  Levaquin  Vancomycin  HPI/Subjective: Pt is alert currently intubated. No acute issues reported overnight.  Objective: Filed Vitals:   07/26/14 0738  BP:   Pulse:   Temp: 98.1 F (36.7 C)  Resp:     Intake/Output Summary (Last 24 hours) at 07/26/14 0926 Last data filed at 07/26/14 0600  Gross per 24 hour  Intake 2431.58 ml  Output   1100 ml  Net 1331.58 ml   Filed Weights   07/24/14 0958 07/25/14 0500 07/26/14 0500  Weight: 106.3 kg (234 lb 5.6 oz) 105.6 kg (232 lb 12.9 oz) 106.2 kg (234 lb 2.1 oz)    Exam:   General:  Pt in nad, alert and awake  Cardiovascular: rrr, no mrg  Respiratory: on ventilator  Abdomen: no guarding, nd,  nt  Musculoskeletal: no cyanosis or clubbing   Data Reviewed: Basic Metabolic Panel:  Recent Labs Lab 07/24/14 0715 07/25/14 0537 07/26/14 0440  NA 142 140 138  K 3.5* 3.7 3.9  CL 102 103 102  CO2 29 27 26   GLUCOSE 102* 146* 145*  BUN 14 15 13   CREATININE 1.26* 0.69 0.63  CALCIUM 8.0* 8.2* 8.5  MG 1.7  --   --   PHOS 6.9*  --   --    Liver Function Tests:  Recent Labs Lab 07/24/14 0715  AST 117*  ALT 123*  ALKPHOS 117  BILITOT 0.4  PROT 6.7  ALBUMIN 3.2*   No results for input(s): LIPASE, AMYLASE in the last 168 hours. No results for input(s): AMMONIA in the last 168 hours. CBC:  Recent Labs Lab 07/24/14 0715 07/25/14 0537  WBC 12.1* 21.5*  NEUTROABS 8.6*  --   HGB 14.4 13.5  HCT 46.8* 41.9  MCV 96.5 92.5  PLT 245 191   Cardiac Enzymes:  Recent Labs Lab 07/24/14 0638  TROPONINI <0.30   BNP (last 3 results) No results for input(s): PROBNP in the last 8760 hours. CBG: No results for input(s): GLUCAP in the last 168 hours.  Recent Results (from the past 240 hour(s))  Blood culture (routine x 2)     Status: None (Preliminary result)   Collection Time: 07/24/14  7:15 AM  Result Value Ref Range Status   Specimen Description BLOOD LEFT ANTECUBITAL  Final   Special Requests BOTTLES DRAWN AEROBIC AND ANAEROBIC 6CC EACH  Final   Culture NO GROWTH 1 DAY  Final   Report Status PENDING  Incomplete  Blood culture (routine x 2)     Status: None (Preliminary result)   Collection Time: 07/24/14  8:56 AM  Result Value Ref Range Status   Specimen Description BLOOD LEFT HAND  Final   Special Requests BOTTLES DRAWN AEROBIC AND ANAEROBIC 6CC EACH  Final   Culture NO GROWTH 1 DAY  Final   Report Status PENDING  Incomplete  MRSA PCR Screening     Status: Abnormal   Collection Time: 07/24/14  9:55 AM  Result Value Ref Range Status   MRSA by PCR POSITIVE (A) NEGATIVE Final    Comment:        The GeneXpert MRSA Assay (FDA approved for NASAL specimens only), is  one component of a comprehensive MRSA colonization surveillance program. It is not intended to diagnose MRSA infection nor to guide or monitor treatment for MRSA infections. RESULT CALLED TO, READ BACK BY AND VERIFIED WITH: MCDANIEL,M. AT 1441 ON 07/24/2014 BY BAUGHAM,M.      Studies: Portable Chest Xray  07/25/2014   CLINICAL DATA:  Respiratory failure.  Intubated patient.  EXAM: PORTABLE CHEST - 1 VIEW  COMPARISON:  Single view of the chest 07/24/2014 and 09/29/2013.  FINDINGS: The patient's endotracheal tube has been pulled back since yesterday's examination with the tip now in good position just below the clavicular heads. NG tube courses into the stomach. Heart size is upper normal. Prominence of the pulmonary interstitium is unchanged. Subsegmental atelectasis is noted in the left lung base. There is no pneumothorax or pleural effusion.  IMPRESSION: ET tube in good position.  Cardiomegaly and vascular congestion.  Subsegmental atelectasis left lung base.   Electronically Signed   By: Drusilla Kannerhomas  Dalessio M.D.   On: 07/25/2014 07:26    Scheduled Meds: . antiseptic oral rinse  7 mL Mouth Rinse QID  . chlorhexidine  15 mL Mouth Rinse BID  . Chlorhexidine Gluconate Cloth  6 each Topical Q0600  . heparin  5,000 Units Subcutaneous 3 times per day  . levalbuterol  1.25 mg Nebulization 4 times per day  . levofloxacin (LEVAQUIN) IV  750 mg Intravenous Q24H  . methylPREDNISolone (SOLU-MEDROL) injection  40 mg Intravenous Q12H  . mupirocin ointment  1 application Nasal BID  . pantoprazole (PROTONIX) IV  40 mg Intravenous Q24H  . sodium chloride  3 mL Intravenous Q12H  . vancomycin  1,000 mg Intravenous Q12H   Continuous Infusions: . dextrose 5 % and 0.45 % NaCl with KCl 20 mEq/L 75 mL/hr at 07/26/14 0600  . propofol Stopped (07/26/14 0820)     Time spent: > 35 minutes    [name], [name]  [facility] Pager [phone]. If 7PM-7AM, please contact night-coverage at [email or web address],  password [email or web address] 07/26/2014, 9:26 AM  LOS: 2 days

## 2014-07-26 NOTE — Progress Notes (Signed)
Subjective: No new complaints have been noted. She failed weaning yesterday. She is arousable.  Objective: Vital signs in last 24 hours: Temp:  [98.1 F (36.7 C)-99 F (37.2 C)] 98.1 F (36.7 C) (11/27 0738) Pulse Rate:  [72-109] 74 (11/27 0700) Resp:  [13-25] 20 (11/27 0700) BP: (113-152)/(63-103) 141/87 mmHg (11/27 0700) SpO2:  [93 %-100 %] 97 % (11/27 0703) FiO2 (%):  [40 %] 40 % (11/27 0703) Weight:  [106.2 kg (234 lb 2.1 oz)] 106.2 kg (234 lb 2.1 oz) (11/27 0500) Weight change: -0.1 kg (-3.5 oz) Last BM Date:  (patient unable to recall; pt intubated and sedated)  Intake/Output from previous day: 11/26 0701 - 11/27 0700 In: 2431.6 [I.V.:2011.6; NG/GT:20; IV Piggyback:400] Out: 1100 [Urine:1100]  PHYSICAL EXAM General appearance: alert and moderate distress Resp: rhonchi bilaterally Cardio: regular rate and rhythm, S1, S2 normal, no murmur, click, rub or gallop GI: soft, non-tender; bowel sounds normal; no masses,  no organomegaly Extremities: extremities normal, atraumatic, no cyanosis or edema  Lab Results:  Results for orders placed or performed during the hospital encounter of 07/24/14 (from the past 48 hour(s))  Blood culture (routine x 2)     Status: None (Preliminary result)   Collection Time: 07/24/14  8:56 AM  Result Value Ref Range   Specimen Description BLOOD LEFT HAND    Special Requests BOTTLES DRAWN AEROBIC AND ANAEROBIC [place]    Culture NO GROWTH 1 DAY    Report Status PENDING   I-Stat CG4 Lactic Acid, ED     Status: None   Collection Time: 07/24/14  8:59 AM  Result Value Ref Range   Lactic Acid, Venous 1.43 0.5 - 2.2 mmol/L  MRSA PCR Screening     Status: Abnormal   Collection Time: 07/24/14  9:55 AM  Result Value Ref Range   MRSA by PCR POSITIVE (A) NEGATIVE    Comment:        The GeneXpert MRSA Assay (FDA approved for NASAL specimens only), is one component of a comprehensive MRSA colonization surveillance program. It is not intended to  diagnose MRSA infection nor to guide or monitor treatment for MRSA infections. RESULT CALLED TO, READ BACK BY AND VERIFIED WITH: MCDANIEL,M. AT 1441 ON 07/24/2014 BY BAUGHAM,M.   Blood gas, arterial     Status: Abnormal   Collection Time: 07/24/14 10:15 AM  Result Value Ref Range   FIO2 50.00 %   Delivery systems VENTILATOR    Mode PRESSURE REGULATED VOLUME CONTROL    VT 500 mL   Rate 18 resp/min   Peep/cpap 5.0 cm H20   pH, Arterial 7.199 (LL) 7.350 - 7.450    Comment: CRITICAL RESULT CALLED TO, READ BACK BY AND VERIFIED WITH: HILTON,L.RN AT 1032 07/24/14 BY BROADNAX,L.RRT    pCO2 arterial 68.0 (HH) 35.0 - 45.0 mmHg    Comment: CRITICAL RESULT CALLED TO, READ BACK BY AND VERIFIED WITH: HILTON,L.RN AT 1033 07/24/14 BY BROADNAX,L.RRT    pO2, Arterial 89.2 80.0 - 100.0 mmHg   Bicarbonate 25.5 (H) 20.0 - 24.0 mEq/L   TCO2 23.3 0 - 100 mmol/L   Acid-base deficit 1.7 0.0 - 2.0 mmol/L   O2 Saturation 95.1 %   Patient temperature 37.0    Collection site LEFT RADIAL    Drawn by [id number]    Sample type ARTERIAL    Allens test (pass/fail) PASS PASS  Blood gas, arterial     Status: Abnormal   Collection Time: 07/24/14  1:30 PM  Result Value Ref Range  FIO2 50.00 %   Delivery systems VENTILATOR    Mode PRESSURE REGULATED VOLUME CONTROL    VT 520 mL   Rate 20 resp/min   Peep/cpap 5.0 cm H20   pH, Arterial 7.366 7.350 - 7.450   pCO2 arterial 44.5 35.0 - 45.0 mmHg   pO2, Arterial 73.8 (L) 80.0 - 100.0 mmHg   Bicarbonate 24.9 (H) 20.0 - 24.0 mEq/L   TCO2 22.0 0 - 100 mmol/L   Acid-Base Excess 0.2 0.0 - 2.0 mmol/L   O2 Saturation 95.3 %   Patient temperature 37.0    Collection site RIGHT RADIAL    Drawn by [id number]    Sample type ARTERIAL    Allens test (pass/fail) PASS PASS  Blood gas, arterial     Status: Abnormal   Collection Time: 07/25/14  5:00 AM  Result Value Ref Range   FIO2 0.40 %   Delivery systems VENTILATOR    Mode PRESSURE REGULATED VOLUME CONTROL    VT 520 mL    Rate 20.0 resp/min   Peep/cpap 5.0 cm H20   pH, Arterial 7.447 7.350 - 7.450   pCO2 arterial 37.9 35.0 - 45.0 mmHg   pO2, Arterial 71.9 (L) 80.0 - 100.0 mmHg   Bicarbonate 25.8 (H) 20.0 - 24.0 mEq/L   TCO2 22.7 0 - 100 mmol/L   Acid-Base Excess 2.1 (H) 0.0 - 2.0 mmol/L   O2 Saturation 95.3 %   Patient temperature 37.0    Collection site RIGHT RADIAL    Drawn by [id number]    Sample type ARTERIAL DRAW    Allens test (pass/fail) PASS PASS  Basic metabolic panel     Status: Abnormal   Collection Time: 07/25/14  5:37 AM  Result Value Ref Range   Sodium 140 137 - 147 mEq/L   Potassium 3.7 3.7 - 5.3 mEq/L   Chloride 103 96 - 112 mEq/L   CO2 27 19 - 32 mEq/L   Glucose, Bld 146 (H) 70 - 99 mg/dL   BUN 15 6 - 23 mg/dL   Creatinine, Ser 0.69 0.50 - 1.10 mg/dL    Comment: DELTA CHECK NOTED   Calcium 8.2 (L) 8.4 - 10.5 mg/dL   GFR calc non Af Amer >90 >90 mL/min   GFR calc Af Amer >90 >90 mL/min    Comment: (NOTE) The eGFR has been calculated using the CKD EPI equation. This calculation has not been validated in all clinical situations. eGFR's persistently <90 mL/min signify possible Chronic Kidney Disease.    Anion gap 10 5 - 15  CBC     Status: Abnormal   Collection Time: 07/25/14  5:37 AM  Result Value Ref Range   WBC 21.5 (H) 4.0 - 10.5 K/uL   RBC 4.53 3.87 - 5.11 MIL/uL   Hemoglobin 13.5 12.0 - 15.0 g/dL   HCT 41.9 36.0 - 46.0 %   MCV 92.5 78.0 - 100.0 fL   MCH 29.8 26.0 - 34.0 pg   MCHC 32.2 30.0 - 36.0 g/dL   RDW 16.7 (H) 11.5 - 15.5 %   Platelets 191 150 - 400 K/uL  Triglycerides     Status: None   Collection Time: 07/25/14  5:37 AM  Result Value Ref Range   Triglycerides 73 <150 mg/dL  Basic metabolic panel     Status: Abnormal   Collection Time: 07/26/14  4:40 AM  Result Value Ref Range   Sodium 138 137 - 147 mEq/L   Potassium 3.9 3.7 - 5.3 mEq/L   Chloride  102 96 - 112 mEq/L   CO2 26 19 - 32 mEq/L   Glucose, Bld 145 (H) 70 - 99 mg/dL   BUN 13 6 - 23 mg/dL    Creatinine, Ser 0.63 0.50 - 1.10 mg/dL   Calcium 8.5 8.4 - 10.5 mg/dL   GFR calc non Af Amer >90 >90 mL/min   GFR calc Af Amer >90 >90 mL/min    Comment: (NOTE) The eGFR has been calculated using the CKD EPI equation. This calculation has not been validated in all clinical situations. eGFR's persistently <90 mL/min signify possible Chronic Kidney Disease.    Anion gap 10 5 - 15  Blood gas, arterial     Status: Abnormal   Collection Time: 07/26/14  5:10 AM  Result Value Ref Range   FIO2 40.00 %   Delivery systems VENTILATOR    Mode PRESSURE REGULATED VOLUME CONTROL    VT 520 mL   Rate 20 resp/min   Peep/cpap 5.0 cm H20   pH, Arterial 7.471 (H) 7.350 - 7.450   pCO2 arterial 37.9 35.0 - 45.0 mmHg   pO2, Arterial 55.6 (L) 80.0 - 100.0 mmHg   Bicarbonate 27.3 (H) 20.0 - 24.0 mEq/L   TCO2 24.0 0 - 100 mmol/L   Acid-Base Excess 3.8 (H) 0.0 - 2.0 mmol/L   O2 Saturation 89.6 %   Patient temperature 37.0    Collection site RIGHT RADIAL    Drawn by [id number]    Sample type ARTERIAL    Allens test (pass/fail) PASS PASS    ABGS  Recent Labs  07/26/14 0510  PHART 7.471*  PO2ART 55.6*  TCO2 24.0  HCO3 27.3*   CULTURES Recent Results (from the past 240 hour(s))  Blood culture (routine x 2)     Status: None (Preliminary result)   Collection Time: 07/24/14  7:15 AM  Result Value Ref Range Status   Specimen Description BLOOD LEFT ANTECUBITAL  Final   Special Requests BOTTLES DRAWN AEROBIC AND ANAEROBIC 6CC EACH  Final   Culture NO GROWTH 1 DAY  Final   Report Status PENDING  Incomplete  Blood culture (routine x 2)     Status: None (Preliminary result)   Collection Time: 07/24/14  8:56 AM  Result Value Ref Range Status   Specimen Description BLOOD LEFT HAND  Final   Special Requests BOTTLES DRAWN AEROBIC AND ANAEROBIC 6CC EACH  Final   Culture NO GROWTH 1 DAY  Final   Report Status PENDING  Incomplete  MRSA PCR Screening     Status: Abnormal   Collection Time: 07/24/14  9:55 AM   Result Value Ref Range Status   MRSA by PCR POSITIVE (A) NEGATIVE Final    Comment:        The GeneXpert MRSA Assay (FDA approved for NASAL specimens only), is one component of a comprehensive MRSA colonization surveillance program. It is not intended to diagnose MRSA infection nor to guide or monitor treatment for MRSA infections. RESULT CALLED TO, READ BACK BY AND VERIFIED WITH: MCDANIEL,M. AT 1441 ON 07/24/2014 BY BAUGHAM,M.    Studies/Results: Portable Chest Xray  07/25/2014   CLINICAL DATA:  Respiratory failure.  Intubated patient.  EXAM: PORTABLE CHEST - 1 VIEW  COMPARISON:  Single view of the chest 07/24/2014 and 09/29/2013.  FINDINGS: The patient's endotracheal tube has been pulled back since yesterday's examination with the tip now in good position just below the clavicular heads. NG tube courses into the stomach. Heart size is upper normal. Prominence of the  pulmonary interstitium is unchanged. Subsegmental atelectasis is noted in the left lung base. There is no pneumothorax or pleural effusion.  IMPRESSION: ET tube in good position.  Cardiomegaly and vascular congestion.  Subsegmental atelectasis left lung base.   Electronically Signed   By: Inge Rise M.D.   On: 07/25/2014 07:26    Medications:  Prior to Admission:  Prescriptions prior to admission  Medication Sig Dispense Refill Last Dose  . albuterol (PROVENTIL) (2.5 MG/3ML) 0.083% nebulizer solution Take 3 mLs (2.5 mg total) by nebulization every 6 (six) hours as needed for wheezing or shortness of breath. 75 mL 3 Taking  . benzonatate (TESSALON) 100 MG capsule TAKE 1 CAPSULE (100 MG TOTAL) BY MOUTH 2 (TWO) TIMES DAILY AS NEEDED FOR COUGH. 20 capsule 0   . cyclobenzaprine (FLEXERIL) 10 MG tablet TAKE 1 TABLET (10 MG TOTAL) BY MOUTH 3 (THREE) TIMES DAILY AS NEEDED FOR MUSCLE SPASMS. 90 tablet 2   . diazepam (VALIUM) 5 MG tablet TAKE 1 TABLET BY MOUTH EVERY 8 HOURS 90 tablet 0   . diclofenac (VOLTAREN) 75 MG EC  tablet Take 75 mg by mouth 2 (two) times daily.     . DULoxetine (CYMBALTA) 60 MG capsule TAKE ONE CAPSULE BY MOUTH EVERY DAY 30 capsule 0   . fluticasone (FLONASE) 50 MCG/ACT nasal spray PLACE 2 SPRAYS INTO THE NOSE DAILY. 16 g 1   . HYDROmorphone HCl (EXALGO) 12 MG T24A SR tablet Take 12 mg by mouth daily.     Marland Kitchen ibuprofen (ADVIL,MOTRIN) 800 MG tablet TAKE 1 TABLET (800 MG TOTAL) BY MOUTH EVERY 6 (SIX) HOURS AS NEEDED FOR PAIN. 90 tablet 0   . levalbuterol (XOPENEX HFA) 45 MCG/ACT inhaler Inhale 2 puffs into the lungs every 4 (four) hours as needed for wheezing or shortness of breath.   Taking  . Naloxone HCl (EVZIO) 0.4 MG/0.4ML SOAJ Inject as directed.     Marland Kitchen PROAIR HFA 108 (90 BASE) MCG/ACT inhaler INHALE 2 PUFFS INTO THE LUNGS EVERY SIX HOURS AS NEEDED FOR WHEEZING OR SHORTNESS OF BREATH 8.5 each 2   . traMADol (ULTRAM) 50 MG tablet Take 50 mg by mouth every 6 (six) hours as needed. pain   Taking  . atorvastatin (LIPITOR) 20 MG tablet Take 1 tablet (20 mg total) by mouth daily. 90 tablet 0 Taking  . buPROPion (WELLBUTRIN XL) 150 MG 24 hr tablet TAKE 1 TABLET (150 MG TOTAL) BY MOUTH EVERY MORNING. 90 tablet 0   . Fluticasone Furoate-Vilanterol 100-25 MCG/INH AEPB Inhale 1 puff into the lungs daily. 14 each 0   . furosemide (LASIX) 40 MG tablet Take 1 tablet (40 mg total) by mouth daily. 90 tablet 0 Taking  . gabapentin (NEURONTIN) 300 MG capsule Take 300 mg by mouth 3 (three) times daily.    Taking  . montelukast (SINGULAIR) 10 MG tablet Take 1 tablet (10 mg total) by mouth at bedtime. 90 tablet 1 Taking  . omeprazole (PRILOSEC) 20 MG capsule Take 1 capsule (20 mg total) by mouth daily. 90 capsule 1 Taking  . PATANOL 0.1 % ophthalmic solution PLACE 1 DROP INTO BOTH EYES 2 (TWO) TIMES DAILY. 15 mL 0   . tiotropium (SPIRIVA HANDIHALER) 18 MCG inhalation capsule Place 1 capsule (18 mcg total) into inhaler and inhale daily. 30 capsule 12 Taking   Scheduled: . antiseptic oral rinse  7 mL Mouth  Rinse QID  . chlorhexidine  15 mL Mouth Rinse BID  . Chlorhexidine Gluconate Cloth  6 each Topical  Q0600  . heparin  5,000 Units Subcutaneous 3 times per day  . levalbuterol  1.25 mg Nebulization 4 times per day  . levofloxacin (LEVAQUIN) IV  750 mg Intravenous Q24H  . methylPREDNISolone (SOLU-MEDROL) injection  40 mg Intravenous Q12H  . mupirocin ointment  1 application Nasal BID  . pantoprazole (PROTONIX) IV  40 mg Intravenous Q24H  . sodium chloride  3 mL Intravenous Q12H  . vancomycin  1,000 mg Intravenous Q12H   Continuous: . dextrose 5 % and 0.45 % NaCl with KCl 20 mEq/L 75 mL/hr at 07/26/14 0600  . propofol 5 mcg/kg/min (07/26/14 0738)   VMT:[id number], fentaNYL  Assesment: She has respiratory failure requiring ventilator support. She has COPD with exacerbation. She probably aspirated. She failed weaning yesterday. Active Problems:   Chronic pain disorder   COPD (chronic obstructive pulmonary disease)   Depression   Fibromyalgia   Acute-on-chronic respiratory failure   Respiratory failure requiring intubation   SIRS (systemic inflammatory response syndrome)    Plan: Weaning attempt again today    LOS: 2 days   Zafiro Routson L 07/26/2014, 8:07 AM

## 2014-07-26 NOTE — Procedures (Signed)
Extubation Procedure Note  Patient Details:   Name: Bethany Villa DOB: 01/31/1956 MRN: [id number]   Airway Documentation:  Airway 7.5 mm (Active)  Secured at (cm) 22 cm 07/26/2014  7:38 AM  Measured From Lips 07/26/2014  7:38 AM  Secured Location Left 07/26/2014  7:38 AM  Secured By [employer] Tube Holder 07/26/2014  7:38 AM  Tube Holder Repositioned Yes 07/26/2014  7:03 AM  Cuff Pressure (cm H2O) 22 cm H2O 07/25/2014  8:27 PM  Site Condition Dry 07/26/2014  7:38 AM   VC:  1.1 L.  Patient weaned well, extubated to 2 LPM nasal can.  Saturation 96%, will continue to monitor.  Evaluation  O2 sats: stable throughout Complications: No apparent complications Patient did tolerate procedure well. Bilateral Breath Sounds: Diminished Suctioning: Airway Yes  Noreene FilbertLasley, Andri Prestia Billingsley 07/26/2014, 9:28 AM

## 2014-07-27 DIAGNOSIS — G894 Chronic pain syndrome: Secondary | ICD-10-CM

## 2014-07-27 LAB — COMPREHENSIVE METABOLIC PANEL
ALT: 46 U/L — AB (ref 0–35)
ANION GAP: 10 (ref 5–15)
AST: 21 U/L (ref 0–37)
Albumin: 2.5 g/dL — ABNORMAL LOW (ref 3.5–5.2)
Alkaline Phosphatase: 94 U/L (ref 39–117)
BUN: 10 mg/dL (ref 6–23)
CALCIUM: 8.4 mg/dL (ref 8.4–10.5)
CO2: 29 meq/L (ref 19–32)
CREATININE: 0.55 mg/dL (ref 0.50–1.10)
Chloride: 104 mEq/L (ref 96–112)
GLUCOSE: 126 mg/dL — AB (ref 70–99)
Potassium: 3.9 mEq/L (ref 3.7–5.3)
Sodium: 143 mEq/L (ref 137–147)
TOTAL PROTEIN: 5.6 g/dL — AB (ref 6.0–8.3)
Total Bilirubin: 0.5 mg/dL (ref 0.3–1.2)

## 2014-07-27 MED ORDER — DULOXETINE HCL 60 MG PO CPEP
60.0000 mg | ORAL_CAPSULE | Freq: Every day | ORAL | Status: DC
  Administered 2014-07-27 – 2014-07-28 (×2): 60 mg via ORAL
  Filled 2014-07-27 (×2): qty 1

## 2014-07-27 MED ORDER — CYCLOBENZAPRINE HCL 10 MG PO TABS: 5.0000 mg | ORAL_TABLET | Freq: Three times a day (TID) | ORAL | Status: DC | PRN

## 2014-07-27 MED ORDER — LEVALBUTEROL HCL 1.25 MG/0.5ML IN NEBU
1.2500 mg | INHALATION_SOLUTION | Freq: Three times a day (TID) | RESPIRATORY_TRACT | Status: DC
  Administered 2014-07-27 – 2014-07-28 (×4): 1.25 mg via RESPIRATORY_TRACT
  Filled 2014-07-27 (×4): qty 0.5

## 2014-07-27 MED ORDER — PANTOPRAZOLE SODIUM 40 MG PO TBEC
40.0000 mg | DELAYED_RELEASE_TABLET | Freq: Every day | ORAL | Status: DC
  Administered 2014-07-27 – 2014-07-28 (×2): 40 mg via ORAL
  Filled 2014-07-27 (×2): qty 1

## 2014-07-27 NOTE — Progress Notes (Signed)
Patient transferred to room 341. Report given Karl LukeLisa Covington RN. Patient low fall risk.

## 2014-07-27 NOTE — Progress Notes (Signed)
Subjective: She says she feels much better. She was able to be extubated yesterday. She has no new complaints. She does not remember the events that led to her admission. She says she was sick with cough and congestion for several days prior to admission.  Objective: Vital signs in last 24 hours: Temp:  [96.8 F (36 C)-98.1 F (36.7 C)] 98.1 F (36.7 C) (11/28 0527) Pulse Rate:  [69-96] 81 (11/28 0900) Resp:  [16-25] 18 (11/28 0900) BP: (121-158)/(67-122) 142/81 mmHg (11/28 0800) SpO2:  [88 %-98 %] 96 % (11/28 0900) Weight:  [104.6 kg (230 lb 9.6 oz)] 104.6 kg (230 lb 9.6 oz) (11/28 0527) Weight change: -1.6 kg (-3 lb 8.4 oz) Last BM Date: 07/24/14  Intake/Output from previous day: 11/27 0701 - 11/28 0700 In: 3835.3 [P.O.:1020; I.V.:1815.3; IV Piggyback:1000] Out: 2225 [Urine:2225]  PHYSICAL EXAM General appearance: alert, cooperative, no distress and morbidly obese Resp: clear to auscultation bilaterally Cardio: regular rate and rhythm, S1, S2 normal, no murmur, click, rub or gallop GI: soft, non-tender; bowel sounds normal; no masses,  no organomegaly Extremities: extremities normal, atraumatic, no cyanosis or edema  Lab Results:  Results for orders placed or performed during the hospital encounter of 07/24/14 (from the past 48 hour(s))  Basic metabolic panel     Status: Abnormal   Collection Time: 07/26/14  4:40 AM  Result Value Ref Range   Sodium 138 137 - 147 mEq/L   Potassium 3.9 3.7 - 5.3 mEq/L   Chloride 102 96 - 112 mEq/L   CO2 26 19 - 32 mEq/L   Glucose, Bld 145 (H) 70 - 99 mg/dL   BUN 13 6 - 23 mg/dL   Creatinine, Ser 0.63 0.50 - 1.10 mg/dL   Calcium 8.5 8.4 - 10.5 mg/dL   GFR calc non Af Amer >90 >90 mL/min   GFR calc Af Amer >90 >90 mL/min    Comment: (NOTE) The eGFR has been calculated using the CKD EPI equation. This calculation has not been validated in all clinical situations. eGFR's persistently <90 mL/min signify possible Chronic Kidney Disease.     Anion gap 10 5 - 15  Blood gas, arterial     Status: Abnormal   Collection Time: 07/26/14  5:10 AM  Result Value Ref Range   FIO2 40.00 %   Delivery systems VENTILATOR    Mode PRESSURE REGULATED VOLUME CONTROL    VT 520 mL   Rate 20 resp/min   Peep/cpap 5.0 cm H20   pH, Arterial 7.471 (H) 7.350 - 7.450   pCO2 arterial 37.9 35.0 - 45.0 mmHg   pO2, Arterial 55.6 (L) 80.0 - 100.0 mmHg   Bicarbonate 27.3 (H) 20.0 - 24.0 mEq/L   TCO2 24.0 0 - 100 mmol/L   Acid-Base Excess 3.8 (H) 0.0 - 2.0 mmol/L   O2 Saturation 89.6 %   Patient temperature 37.0    Collection site RIGHT RADIAL    Drawn by [id number]    Sample type ARTERIAL    Allens test (pass/fail) PASS PASS  Vancomycin, trough     Status: None   Collection Time: 07/26/14 10:00 AM  Result Value Ref Range   Vancomycin Tr 11.7 10.0 - 20.0 ug/mL  Comprehensive metabolic panel     Status: Abnormal   Collection Time: 07/27/14  4:58 AM  Result Value Ref Range   Sodium 143 137 - 147 mEq/L   Potassium 3.9 3.7 - 5.3 mEq/L   Chloride 104 96 - 112 mEq/L   CO2 29 19 -  32 mEq/L   Glucose, Bld 126 (H) 70 - 99 mg/dL   BUN 10 6 - 23 mg/dL   Creatinine, Ser 0.55 0.50 - 1.10 mg/dL   Calcium 8.4 8.4 - 10.5 mg/dL   Total Protein 5.6 (L) 6.0 - 8.3 g/dL   Albumin 2.5 (L) 3.5 - 5.2 g/dL   AST 21 0 - 37 U/L   ALT 46 (H) 0 - 35 U/L   Alkaline Phosphatase 94 39 - 117 U/L   Total Bilirubin 0.5 0.3 - 1.2 mg/dL   GFR calc non Af Amer >90 >90 mL/min   GFR calc Af Amer >90 >90 mL/min    Comment: (NOTE) The eGFR has been calculated using the CKD EPI equation. This calculation has not been validated in all clinical situations. eGFR's persistently <90 mL/min signify possible Chronic Kidney Disease.    Anion gap 10 5 - 15    ABGS  Recent Labs  07/26/14 0510  PHART 7.471*  PO2ART 55.6*  TCO2 24.0  HCO3 27.3*   CULTURES Recent Results (from the past 240 hour(s))  Blood culture (routine x 2)     Status: None (Preliminary result)   Collection  Time: 07/24/14  7:15 AM  Result Value Ref Range Status   Specimen Description BLOOD LEFT ANTECUBITAL  Final   Special Requests BOTTLES DRAWN AEROBIC AND ANAEROBIC 6CC EACH  Final   Culture NO GROWTH 3 DAYS  Final   Report Status PENDING  Incomplete  Blood culture (routine x 2)     Status: None (Preliminary result)   Collection Time: 07/24/14  8:56 AM  Result Value Ref Range Status   Specimen Description BLOOD LEFT HAND  Final   Special Requests BOTTLES DRAWN AEROBIC AND ANAEROBIC 6CC EACH  Final   Culture NO GROWTH 3 DAYS  Final   Report Status PENDING  Incomplete  MRSA PCR Screening     Status: Abnormal   Collection Time: 07/24/14  9:55 AM  Result Value Ref Range Status   MRSA by PCR POSITIVE (A) NEGATIVE Final    Comment:        The GeneXpert MRSA Assay (FDA approved for NASAL specimens only), is one component of a comprehensive MRSA colonization surveillance program. It is not intended to diagnose MRSA infection nor to guide or monitor treatment for MRSA infections. RESULT CALLED TO, READ BACK BY AND VERIFIED WITH: MCDANIEL,M. AT 1441 ON 07/24/2014 BY BAUGHAM,M.    Studies/Results: No results found.  Medications:  Prior to Admission:  Prescriptions prior to admission  Medication Sig Dispense Refill Last Dose  . albuterol (PROVENTIL) (2.5 MG/3ML) 0.083% nebulizer solution Take 3 mLs (2.5 mg total) by nebulization every 6 (six) hours as needed for wheezing or shortness of breath. 75 mL 3 07/23/2014  . benzonatate (TESSALON) 100 MG capsule TAKE 1 CAPSULE (100 MG TOTAL) BY MOUTH 2 (TWO) TIMES DAILY AS NEEDED FOR COUGH. 20 capsule 0 07/23/2014  . cyclobenzaprine (FLEXERIL) 10 MG tablet TAKE 1 TABLET (10 MG TOTAL) BY MOUTH 3 (THREE) TIMES DAILY AS NEEDED FOR MUSCLE SPASMS. 90 tablet 2 07/23/2014  . diazepam (VALIUM) 5 MG tablet TAKE 1 TABLET BY MOUTH EVERY 8 HOURS 90 tablet 0 07/23/2014  . diclofenac (VOLTAREN) 75 MG EC tablet Take 75 mg by mouth 2 (two) times daily.    07/23/2014  . DULoxetine (CYMBALTA) 60 MG capsule TAKE ONE CAPSULE BY MOUTH EVERY DAY 30 capsule 0 07/23/2014  . fluticasone (FLONASE) 50 MCG/ACT nasal spray PLACE 2 SPRAYS INTO THE NOSE  DAILY. 16 g 1 07/23/2014  . Fluticasone Furoate-Vilanterol 100-25 MCG/INH AEPB Inhale 1 puff into the lungs daily. 14 each 0 07/23/2014  . HYDROmorphone HCl (EXALGO) 12 MG T24A SR tablet Take 12 mg by mouth daily.   07/23/2014  . ibuprofen (ADVIL,MOTRIN) 800 MG tablet TAKE 1 TABLET (800 MG TOTAL) BY MOUTH EVERY 6 (SIX) HOURS AS NEEDED FOR PAIN. 90 tablet 0 07/23/2014  . levalbuterol (XOPENEX HFA) 45 MCG/ACT inhaler Inhale 2 puffs into the lungs every 4 (four) hours as needed for wheezing or shortness of breath.   07/23/2014  . Naloxone HCl (EVZIO) 0.4 MG/0.4ML SOAJ Inject as directed.   UNKNOWN  . omeprazole (PRILOSEC) 20 MG capsule Take 1 capsule (20 mg total) by mouth daily. 90 capsule 1 07/23/2014  . PATANOL 0.1 % ophthalmic solution PLACE 1 DROP INTO BOTH EYES 2 (TWO) TIMES DAILY. 15 mL 0 07/23/2014  . PROAIR HFA 108 (90 BASE) MCG/ACT inhaler INHALE 2 PUFFS INTO THE LUNGS EVERY SIX HOURS AS NEEDED FOR WHEEZING OR SHORTNESS OF BREATH 8.5 each 2 07/23/2014  . traMADol (ULTRAM) 50 MG tablet Take 50 mg by mouth every 6 (six) hours as needed. pain   07/23/2014   Scheduled: . antiseptic oral rinse  7 mL Mouth Rinse BID  . Chlorhexidine Gluconate Cloth  6 each Topical Q0600  . heparin  5,000 Units Subcutaneous 3 times per day  . levalbuterol  1.25 mg Nebulization TID  . levofloxacin (LEVAQUIN) IV  750 mg Intravenous Q24H  . methylPREDNISolone (SOLU-MEDROL) injection  40 mg Intravenous Q12H  . mupirocin ointment  1 application Nasal BID  . pantoprazole (PROTONIX) IV  40 mg Intravenous Q24H  . sodium chloride  3 mL Intravenous Q12H  . vancomycin  1,250 mg Intravenous Q12H   Continuous: . dextrose 5 % and 0.45 % NaCl with KCl 20 mEq/L 75 mL/hr at 07/27/14 0529  . propofol Stopped (07/26/14 0820)    VOH:KGOVPCHEK, fentaNYL, traMADol  Assesment: She was admitted with acute respiratory failure requiring intubation. She had systemic inflammatory response syndrome and has improved from both of these. She does have some COPD. She is markedly improved. She was able to be extubated yesterday. Active Problems:   Chronic pain disorder   COPD (chronic obstructive pulmonary disease)   Depression   Fibromyalgia   Acute-on-chronic respiratory failure   Respiratory failure requiring intubation   SIRS (systemic inflammatory response syndrome)    Plan: Continue current treatments. No changes.    LOS: 3 days   Jamiah Recore L 07/27/2014, 9:14 AM

## 2014-07-27 NOTE — Progress Notes (Signed)
[facility] PROGRESS NOTE  Bethany Villa ZOX:[id number] DOB: 01/06/1956 DOA: 07/24/2014 PCP: Rudi HeapMOORE, DONALD, MD  Assessment/Plan: Respiratory failure requiring intubation - Pulmonology assisting with management. - Aspiration versus COPD exacerbation.  - Pt on solumedrol 40 mg IV q 12 hr  SIRs - Stress steroid dosing supplied initially, given that patient was on chronic steroids at home. - Suspect patient may have aspirated given history of copious secretions.  - will simplify antibiotic regimen to levaquin given improvement in condition.  Active Problems:  Chronic pain disorder - Will recommend tramadol here and on discharge. Discussed with patient that she may have to decrease her opioid regimen as home as high opioid medications can cause respiratory depression of which patient I suspect most likely had once she was found initially. As reports indicate that she responded to Narcan initially. - will decrease flexeril dose to 5 mg po tid prn muscle spasm   COPD (chronic obstructive pulmonary disease) - Continue bronchodilators, Levaquin, Solu-Medrol - Pulmonology on board and assisting with management   Depression - Once patient's condition improves will plan on placing back on home regimen   Fibromyalgia -Once patient's condition improves will plan on placing back on home regimen  Code Status: full Family Communication: Discussed with patient and sister at bedside. Disposition Plan: Pending continued improvement in condition   Consultants:  Pulmonologist  Procedures:  None  Antibiotics:  Levaquin  Vancomycin  HPI/Subjective: Pt is alert currently intubated. No acute issues reported overnight.  Objective: Filed Vitals:   07/27/14 0900  BP:   Pulse: 81  Temp:   Resp: 18    Intake/Output Summary (Last 24 hours) at 07/27/14 1022 Last data filed at 07/27/14 0600  Gross per 24 hour  Intake   3820 ml  Output   2225 ml  Net   1595 ml   Filed  Weights   07/25/14 0500 07/26/14 0500 07/27/14 0527  Weight: 105.6 kg (232 lb 12.9 oz) 106.2 kg (234 lb 2.1 oz) 104.6 kg (230 lb 9.6 oz)    Exam:   General:  Pt in nad, alert and awake  Cardiovascular: rrr, no mrg  Respiratory: prolonged expiratory phase, [place] in place, no wheezes  Abdomen: no guarding, nd, nt  Musculoskeletal: no cyanosis or clubbing   Data Reviewed: Basic Metabolic Panel:  Recent Labs Lab 07/24/14 0715 07/25/14 0537 07/26/14 0440 07/27/14 0458  NA 142 140 138 143  K 3.5* 3.7 3.9 3.9  CL 102 103 102 104  CO2 29 27 26 29   GLUCOSE 102* 146* 145* 126*  BUN 14 15 13 10   CREATININE 1.26* 0.69 0.63 0.55  CALCIUM 8.0* 8.2* 8.5 8.4  MG 1.7  --   --   --   PHOS 6.9*  --   --   --    Liver Function Tests:  Recent Labs Lab 07/24/14 0715 07/27/14 0458  AST 117* 21  ALT 123* 46*  ALKPHOS 117 94  BILITOT 0.4 0.5  PROT 6.7 5.6*  ALBUMIN 3.2* 2.5*   No results for input(s): LIPASE, AMYLASE in the last 168 hours. No results for input(s): AMMONIA in the last 168 hours. CBC:  Recent Labs Lab 07/24/14 0715 07/25/14 0537  WBC 12.1* 21.5*  NEUTROABS 8.6*  --   HGB 14.4 13.5  HCT 46.8* 41.9  MCV 96.5 92.5  PLT 245 191   Cardiac Enzymes:  Recent Labs Lab 07/24/14 0638  TROPONINI <0.30   BNP (last 3 results) No results for input(s): PROBNP in the  last 8760 hours. CBG: No results for input(s): GLUCAP in the last 168 hours.  Recent Results (from the past 240 hour(s))  Blood culture (routine x 2)     Status: None (Preliminary result)   Collection Time: 07/24/14  7:15 AM  Result Value Ref Range Status   Specimen Description BLOOD LEFT ANTECUBITAL  Final   Special Requests BOTTLES DRAWN AEROBIC AND ANAEROBIC 6CC EACH  Final   Culture NO GROWTH 3 DAYS  Final   Report Status PENDING  Incomplete  Blood culture (routine x 2)     Status: None (Preliminary result)   Collection Time: 07/24/14  8:56 AM  Result Value Ref Range Status   Specimen  Description BLOOD LEFT HAND  Final   Special Requests BOTTLES DRAWN AEROBIC AND ANAEROBIC 6CC EACH  Final   Culture NO GROWTH 3 DAYS  Final   Report Status PENDING  Incomplete  MRSA PCR Screening     Status: Abnormal   Collection Time: 07/24/14  9:55 AM  Result Value Ref Range Status   MRSA by PCR POSITIVE (A) NEGATIVE Final    Comment:        The GeneXpert MRSA Assay (FDA approved for NASAL specimens only), is one component of a comprehensive MRSA colonization surveillance program. It is not intended to diagnose MRSA infection nor to guide or monitor treatment for MRSA infections. RESULT CALLED TO, READ BACK BY AND VERIFIED WITH: MCDANIEL,M. AT 1441 ON 07/24/2014 BY BAUGHAM,M.      Studies: No results found.  Scheduled Meds: . antiseptic oral rinse  7 mL Mouth Rinse BID  . Chlorhexidine Gluconate Cloth  6 each Topical Q0600  . heparin  5,000 Units Subcutaneous 3 times per day  . levalbuterol  1.25 mg Nebulization TID  . levofloxacin (LEVAQUIN) IV  750 mg Intravenous Q24H  . methylPREDNISolone (SOLU-MEDROL) injection  40 mg Intravenous Q12H  . mupirocin ointment  1 application Nasal BID  . pantoprazole  40 mg Oral Daily  . sodium chloride  3 mL Intravenous Q12H  . vancomycin  1,250 mg Intravenous Q12H   Continuous Infusions: . dextrose 5 % and 0.45 % NaCl with KCl 20 mEq/L 75 mL/hr at 07/27/14 0529     Time spent: > 35 minutes    Balinda PiaVEGA, Alfons Sulkowski  [facility] Pager [phone]. If 7PM-7AM, please contact night-coverage at [email or web address], password [email or web address] 07/27/2014, 10:22 AM  LOS: 3 days

## 2014-07-28 DIAGNOSIS — J9620 Acute and chronic respiratory failure, unspecified whether with hypoxia or hypercapnia: Secondary | ICD-10-CM

## 2014-07-28 LAB — CBC
HCT: 43 % (ref 36.0–46.0)
HEMOGLOBIN: 13.8 g/dL (ref 12.0–15.0)
MCH: 29.9 pg (ref 26.0–34.0)
MCHC: 32.1 g/dL (ref 30.0–36.0)
MCV: 93.3 fL (ref 78.0–100.0)
Platelets: 200 10*3/uL (ref 150–400)
RBC: 4.61 MIL/uL (ref 3.87–5.11)
RDW: 16.7 % — ABNORMAL HIGH (ref 11.5–15.5)
WBC: 10.2 10*3/uL (ref 4.0–10.5)

## 2014-07-28 MED ORDER — PREDNISONE 50 MG PO TABS: ORAL_TABLET | ORAL | Status: DC

## 2014-07-28 MED ORDER — LEVALBUTEROL TARTRATE 45 MCG/ACT IN AERO: 2.0000 | INHALATION_SPRAY | Freq: Three times a day (TID) | RESPIRATORY_TRACT | Status: DC | PRN

## 2014-07-28 MED ORDER — LEVOFLOXACIN 750 MG PO TABS: 750.0000 mg | ORAL_TABLET | Freq: Every day | ORAL | Status: DC

## 2014-07-28 MED ORDER — CYCLOBENZAPRINE HCL 5 MG PO TABS: 5.0000 mg | ORAL_TABLET | Freq: Three times a day (TID) | ORAL | Status: DC | PRN

## 2014-07-28 MED ORDER — TRAMADOL HCL 50 MG PO TABS: 50.0000 mg | ORAL_TABLET | Freq: Four times a day (QID) | ORAL | Status: DC | PRN

## 2014-07-28 MED ORDER — PREDNISONE 10 MG PO TABS: ORAL_TABLET | ORAL | Status: DC

## 2014-07-28 NOTE — Progress Notes (Signed)
Subjective: She says she feels better. She has no new complaints. Her breathing is pretty good.  Objective: Vital signs in last 24 hours: Temp:  [97.5 F (36.4 C)-97.6 F (36.4 C)] 97.5 F (36.4 C) (11/29 0523) Pulse Rate:  [81-95] 81 (11/29 0523) Resp:  [18] 18 (11/29 0523) BP: (147-159)/(84-93) 159/93 mmHg (11/29 0523) SpO2:  [91 %-96 %] 91 % (11/29 0706) Weight change:  Last BM Date: 07/27/14  Intake/Output from previous day: 11/28 0701 - 11/29 0700 In: 2310 [P.O.:360; I.V.:1800; IV Piggyback:150] Out: -   PHYSICAL EXAM General appearance: alert, cooperative and no distress Resp: clear to auscultation bilaterally Cardio: regular rate and rhythm, S1, S2 normal, no murmur, click, rub or gallop GI: soft, non-tender; bowel sounds normal; no masses,  no organomegaly Extremities: extremities normal, atraumatic, no cyanosis or edema  Lab Results:  Results for orders placed or performed during the hospital encounter of 07/24/14 (from the past 48 hour(s))  Vancomycin, trough     Status: None   Collection Time: 07/26/14 10:00 AM  Result Value Ref Range   Vancomycin Tr 11.7 10.0 - 20.0 ug/mL  Comprehensive metabolic panel     Status: Abnormal   Collection Time: 07/27/14  4:58 AM  Result Value Ref Range   Sodium 143 137 - 147 mEq/L   Potassium 3.9 3.7 - 5.3 mEq/L   Chloride 104 96 - 112 mEq/L   CO2 29 19 - 32 mEq/L   Glucose, Bld 126 (H) 70 - 99 mg/dL   BUN 10 6 - 23 mg/dL   Creatinine, Ser 0.55 0.50 - 1.10 mg/dL   Calcium 8.4 8.4 - 10.5 mg/dL   Total Protein 5.6 (L) 6.0 - 8.3 g/dL   Albumin 2.5 (L) 3.5 - 5.2 g/dL   AST 21 0 - 37 U/L   ALT 46 (H) 0 - 35 U/L   Alkaline Phosphatase 94 39 - 117 U/L   Total Bilirubin 0.5 0.3 - 1.2 mg/dL   GFR calc non Af Amer >90 >90 mL/min   GFR calc Af Amer >90 >90 mL/min    Comment: (NOTE) The eGFR has been calculated using the CKD EPI equation. This calculation has not been validated in all clinical situations. eGFR's persistently <90  mL/min signify possible Chronic Kidney Disease.    Anion gap 10 5 - 15  CBC     Status: Abnormal   Collection Time: 07/28/14  6:22 AM  Result Value Ref Range   WBC 10.2 4.0 - 10.5 K/uL   RBC 4.61 3.87 - 5.11 MIL/uL   Hemoglobin 13.8 12.0 - 15.0 g/dL   HCT 43.0 36.0 - 46.0 %   MCV 93.3 78.0 - 100.0 fL   MCH 29.9 26.0 - 34.0 pg   MCHC 32.1 30.0 - 36.0 g/dL   RDW 16.7 (H) 11.5 - 15.5 %   Platelets 200 150 - 400 K/uL    ABGS  Recent Labs  07/26/14 0510  PHART 7.471*  PO2ART 55.6*  TCO2 24.0  HCO3 27.3*   CULTURES Recent Results (from the past 240 hour(s))  Blood culture (routine x 2)     Status: None (Preliminary result)   Collection Time: 07/24/14  7:15 AM  Result Value Ref Range Status   Specimen Description BLOOD LEFT ANTECUBITAL  Final   Special Requests BOTTLES DRAWN AEROBIC AND ANAEROBIC 6CC EACH  Final   Culture NO GROWTH 4 DAYS  Final   Report Status PENDING  Incomplete  Blood culture (routine x 2)  Status: None (Preliminary result)   Collection Time: 07/24/14  8:56 AM  Result Value Ref Range Status   Specimen Description BLOOD LEFT HAND  Final   Special Requests BOTTLES DRAWN AEROBIC AND ANAEROBIC 6CC EACH  Final   Culture NO GROWTH 4 DAYS  Final   Report Status PENDING  Incomplete  MRSA PCR Screening     Status: Abnormal   Collection Time: 07/24/14  9:55 AM  Result Value Ref Range Status   MRSA by PCR POSITIVE (A) NEGATIVE Final    Comment:        The GeneXpert MRSA Assay (FDA approved for NASAL specimens only), is one component of a comprehensive MRSA colonization surveillance program. It is not intended to diagnose MRSA infection nor to guide or monitor treatment for MRSA infections. RESULT CALLED TO, READ BACK BY AND VERIFIED WITH: MCDANIEL,M. AT 1441 ON 07/24/2014 BY BAUGHAM,M.    Studies/Results: No results found.  Medications:  Prior to Admission:  Prescriptions prior to admission  Medication Sig Dispense Refill Last Dose  .  albuterol (PROVENTIL) (2.5 MG/3ML) 0.083% nebulizer solution Take 3 mLs (2.5 mg total) by nebulization every 6 (six) hours as needed for wheezing or shortness of breath. 75 mL 3 07/23/2014  . benzonatate (TESSALON) 100 MG capsule TAKE 1 CAPSULE (100 MG TOTAL) BY MOUTH 2 (TWO) TIMES DAILY AS NEEDED FOR COUGH. 20 capsule 0 07/23/2014  . cyclobenzaprine (FLEXERIL) 10 MG tablet TAKE 1 TABLET (10 MG TOTAL) BY MOUTH 3 (THREE) TIMES DAILY AS NEEDED FOR MUSCLE SPASMS. 90 tablet 2 07/23/2014  . diazepam (VALIUM) 5 MG tablet TAKE 1 TABLET BY MOUTH EVERY 8 HOURS 90 tablet 0 07/23/2014  . diclofenac (VOLTAREN) 75 MG EC tablet Take 75 mg by mouth 2 (two) times daily.   07/23/2014  . DULoxetine (CYMBALTA) 60 MG capsule TAKE ONE CAPSULE BY MOUTH EVERY DAY 30 capsule 0 07/23/2014  . fluticasone (FLONASE) 50 MCG/ACT nasal spray PLACE 2 SPRAYS INTO THE NOSE DAILY. 16 g 1 07/23/2014  . Fluticasone Furoate-Vilanterol 100-25 MCG/INH AEPB Inhale 1 puff into the lungs daily. 14 each 0 07/23/2014  . HYDROmorphone HCl (EXALGO) 12 MG T24A SR tablet Take 12 mg by mouth daily.   07/23/2014  . ibuprofen (ADVIL,MOTRIN) 800 MG tablet TAKE 1 TABLET (800 MG TOTAL) BY MOUTH EVERY 6 (SIX) HOURS AS NEEDED FOR PAIN. 90 tablet 0 07/23/2014  . levalbuterol (XOPENEX HFA) 45 MCG/ACT inhaler Inhale 2 puffs into the lungs every 4 (four) hours as needed for wheezing or shortness of breath.   07/23/2014  . Naloxone HCl (EVZIO) 0.4 MG/0.4ML SOAJ Inject as directed.   UNKNOWN  . omeprazole (PRILOSEC) 20 MG capsule Take 1 capsule (20 mg total) by mouth daily. 90 capsule 1 07/23/2014  . PATANOL 0.1 % ophthalmic solution PLACE 1 DROP INTO BOTH EYES 2 (TWO) TIMES DAILY. 15 mL 0 07/23/2014  . PROAIR HFA 108 (90 BASE) MCG/ACT inhaler INHALE 2 PUFFS INTO THE LUNGS EVERY SIX HOURS AS NEEDED FOR WHEEZING OR SHORTNESS OF BREATH 8.5 each 2 07/23/2014  . traMADol (ULTRAM) 50 MG tablet Take 50 mg by mouth every 6 (six) hours as needed. pain   07/23/2014    Scheduled: . antiseptic oral rinse  7 mL Mouth Rinse BID  . Chlorhexidine Gluconate Cloth  6 each Topical Q0600  . DULoxetine  60 mg Oral Daily  . heparin  5,000 Units Subcutaneous 3 times per day  . levalbuterol  1.25 mg Nebulization TID  . levofloxacin (LEVAQUIN) IV  750 mg Intravenous Q24H  . methylPREDNISolone (SOLU-MEDROL) injection  40 mg Intravenous Q12H  . mupirocin ointment  1 application Nasal BID  . pantoprazole  40 mg Oral Daily  . sodium chloride  3 mL Intravenous Q12H   Continuous: . dextrose 5 % and 0.45 % NaCl with KCl 20 mEq/L 75 mL/hr at 07/27/14 0529   MCN:[id number], cyclobenzaprine, traMADol  Assesment: She was admitted with respiratory failure requiring intubation. She said she was sick for several days prior to this so I think she probably had COPD exacerbation. She has chronic pain which had complicated her problem but I don't believe that she had any sort of overdose that precipitated her respiratory failure. Active Problems:   Chronic pain disorder   COPD (chronic obstructive pulmonary disease)   Depression   Fibromyalgia   Acute-on-chronic respiratory failure   Respiratory failure requiring intubation   SIRS (systemic inflammatory response syndrome)    Plan: No change in treatments.    LOS: 4 days   Tonae Livolsi L 07/28/2014, 9:43 AM

## 2014-07-28 NOTE — Progress Notes (Signed)
Patient d/c home with family, left floor via wheelchair accompanied by staff. D/c instructions , rx's and when to follow up with MD provided to patient. Patient verbalized understanding no c/o pain at d/c

## 2014-07-28 NOTE — Discharge Summary (Signed)
Physician Discharge Summary  NALAYSIA MANGANIELLO ZOX:[id number] DOB: 02/04/1956 DOA: 07/24/2014  PCP: Rudi Heap, MD  Admit date: 07/24/2014 Discharge date: 07/28/2014  Time spent: > 35 minutes  Recommendations for Outpatient Follow-up:  1. Please be sure to follow up with your primary care physician in 1-2 weeks or sooner should any new concerns arise 2. Discontinued patient's home Hydromorphone and valium given somnolence initially   Discharge Diagnoses:  Please see list below  Discharge Condition: stable  Diet recommendation: heart healthy  Filed Weights   07/25/14 0500 07/26/14 0500 07/27/14 0527  Weight: 105.6 kg (232 lb 12.9 oz) 106.2 kg (234 lb 2.1 oz) 104.6 kg (230 lb 9.6 oz)    History of present illness:  58 y/o CF with history of chronic pain on opiods and copd that presented to the hospital after she was found agonally breathing by son.  Hospital Course:   Acute-on-chronic respiratory failure/Respiratory failure requiring intubation - pulmonologist consulted who assisted with care - Pt showed improvement on solumedrol, bronchodilators, and antibiotics and was successfully weaned off of antibiotics  Active Problems:   Chronic pain disorder - recommend controlling pain with tramadol and NSAIDs    COPD (chronic obstructive pulmonary disease) - Principle problem most likely 2ary to copd exacerbation.  - Prednisone taper on discharge - levaquin    Depression - stable continue home regimen - discontinued valium given reports of suspected respiratory depression    Fibromyalgia - tramadol and nsaids, patient reports pain controlled on this regimen.     SIRS (systemic inflammatory response syndrome) - resolved on antibiotics - will d/c on levaquin to complete a 5 day total course of antibiotics on the higher dose of 750 mg   Procedures:  Pt was intubated intially  Consultations:  Pulmonologist  Discharge Exam: Filed Vitals:   07/28/14 0523  BP: 159/93   Pulse: 81  Temp: 97.5 F (36.4 C)  Resp: 18    General: Pt in nad, alert and awake Cardiovascular: RRR, no mrg Respiratory: CTA BL, no wheezes, speaking in full sentences on room air.  Discharge Instructions You were cared for by a hospitalist during your hospital stay. If you have any questions about your discharge medications or the care you received while you were in the hospital after you are discharged, you can call the unit and asked to speak with the hospitalist on call if the hospitalist that took care of you is not available. Once you are discharged, your primary care physician will handle any further medical issues. Please note that NO REFILLS for any discharge medications will be authorized once you are discharged, as it is imperative that you return to your primary care physician (or establish a relationship with a primary care physician if you do not have one) for your aftercare needs so that they can reassess your need for medications and monitor your lab values.  Discharge Instructions    Call MD for:  difficulty breathing, headache or visual disturbances    Complete by:  As directed      Call MD for:  temperature >100.4    Complete by:  As directed      Diet - low sodium heart healthy    Complete by:  As directed      Discharge instructions    Complete by:  As directed   Please stop taking your Dilaudid and Valium. Take Tramadol for pain and NSAIDs.   F/u with your primary care physician for further evaluation and recommendations.  Increase activity slowly    Complete by:  As directed           Current Discharge Medication List    START taking these medications   Details  levofloxacin (LEVAQUIN) 750 MG tablet Take 1 tablet (750 mg total) by mouth daily. Qty: 1 tablet, Refills: 0    predniSONE (DELTASONE) 50 MG tablet Take one tablet by mouth daily Qty: 2 tablet, Refills: 0      CONTINUE these medications which have CHANGED   Details  cyclobenzaprine  (FLEXERIL) 5 MG tablet Take 1 tablet (5 mg total) by mouth 3 (three) times daily as needed for muscle spasms. Qty: 30 tablet, Refills: 0    levalbuterol (XOPENEX HFA) 45 MCG/ACT inhaler Inhale 2 puffs into the lungs every 8 (eight) hours as needed for wheezing or shortness of breath. Qty: 1 Inhaler, Refills: 12    traMADol (ULTRAM) 50 MG tablet Take 1-2 tablets (50-100 mg total) by mouth every 6 (six) hours as needed. pain Qty: 30 tablet, Refills: 0      CONTINUE these medications which have NOT CHANGED   Details  benzonatate (TESSALON) 100 MG capsule TAKE 1 CAPSULE (100 MG TOTAL) BY MOUTH 2 (TWO) TIMES DAILY AS NEEDED FOR COUGH. Qty: 20 capsule, Refills: 0    diclofenac (VOLTAREN) 75 MG EC tablet Take 75 mg by mouth 2 (two) times daily.    DULoxetine (CYMBALTA) 60 MG capsule TAKE ONE CAPSULE BY MOUTH EVERY DAY Qty: 30 capsule, Refills: 0    fluticasone (FLONASE) 50 MCG/ACT nasal spray PLACE 2 SPRAYS INTO THE NOSE DAILY. Qty: 16 g, Refills: 1    Fluticasone Furoate-Vilanterol 100-25 MCG/INH AEPB Inhale 1 puff into the lungs daily. Qty: 14 each, Refills: 0    Naloxone HCl (EVZIO) 0.4 MG/0.4ML SOAJ Inject as directed.    omeprazole (PRILOSEC) 20 MG capsule Take 1 capsule (20 mg total) by mouth daily. Qty: 90 capsule, Refills: 1    PATANOL 0.1 % ophthalmic solution PLACE 1 DROP INTO BOTH EYES 2 (TWO) TIMES DAILY. Qty: 15 mL, Refills: 0      STOP taking these medications     albuterol (PROVENTIL) (2.5 MG/3ML) 0.083% nebulizer solution      diazepam (VALIUM) 5 MG tablet      HYDROmorphone HCl (EXALGO) 12 MG T24A SR tablet      ibuprofen (ADVIL,MOTRIN) 800 MG tablet      PROAIR HFA 108 (90 BASE) MCG/ACT inhaler        Allergies  Allergen Reactions  . Mobic [Meloxicam] Anaphylaxis  . Penicillins Shortness Of Breath and Swelling  . Sulfa Antibiotics Shortness Of Breath and Swelling      The results of significant diagnostics from this hospitalization (including  imaging, microbiology, ancillary and laboratory) are listed below for reference.    Significant Diagnostic Studies: Ct Head Wo Contrast  07/24/2014   CLINICAL DATA:  Altered mental status.  Initial encounter.  EXAM: CT HEAD WITHOUT CONTRAST  TECHNIQUE: Contiguous axial images were obtained from the base of the skull through the vertex without intravenous contrast.  COMPARISON:  11/15/2011.  FINDINGS: No mass lesion, mass effect, midline shift, hydrocephalus, hemorrhage. No territorial ischemia or acute infarction. Mastoid air cells clear. Visible paranasal sinuses clear. Calvarium appears intact. Scout images show endotracheal and enteric tubes.  IMPRESSION: Negative CT head.   Electronically Signed   By: Andreas NewportGeoffrey  Lamke M.D.   On: 07/24/2014 07:22   Portable Chest Xray  07/25/2014   CLINICAL DATA:  Respiratory failure.  Intubated patient.  EXAM: PORTABLE CHEST - 1 VIEW  COMPARISON:  Single view of the chest 07/24/2014 and 09/29/2013.  FINDINGS: The patient's endotracheal tube has been pulled back since yesterday's examination with the tip now in good position just below the clavicular heads. NG tube courses into the stomach. Heart size is upper normal. Prominence of the pulmonary interstitium is unchanged. Subsegmental atelectasis is noted in the left lung base. There is no pneumothorax or pleural effusion.  IMPRESSION: ET tube in good position.  Cardiomegaly and vascular congestion.  Subsegmental atelectasis left lung base.   Electronically Signed   By: Drusilla Kanner M.D.   On: 07/25/2014 07:26   Dg Chest Portable 1 View  07/24/2014   CLINICAL DATA:  Endotracheal tube placement.  Altered mental status.  EXAM: PORTABLE CHEST - 1 VIEW  COMPARISON:  09/29/2013.  FINDINGS: Support apparatus: Endotracheal tube is present. The carina is difficult to visualize but the tip of the tube appears about 7 mm from the carina. This should be withdrawn about 1 cm for better positioning. Tubing projects over the  RIGHT side of the neck, presumably external to the patient.  Cardiomediastinal Silhouette: Within normal limits allowing for low volumes.  Lungs: Basilar atelectasis and low volumes.  No pneumothorax.  Effusions:  None.  Other: Gaseous distention of the stomach. Patient may benefit from enteric decompression.  IMPRESSION: 1. Endotracheal tube tip appears to be about 7 mm from the carina. Withdrawal of 1 cm and repeat chest radiograph recommended. 2. Low volume chest. 3. Tubing projecting over the RIGHT side of the neck, presumably external to the patient.   Electronically Signed   By: Andreas Newport M.D.   On: 07/24/2014 07:04    Microbiology: Recent Results (from the past 240 hour(s))  Blood culture (routine x 2)     Status: None (Preliminary result)   Collection Time: 07/24/14  7:15 AM  Result Value Ref Range Status   Specimen Description BLOOD LEFT ANTECUBITAL  Final   Special Requests BOTTLES DRAWN AEROBIC AND ANAEROBIC 6CC EACH  Final   Culture NO GROWTH 4 DAYS  Final   Report Status PENDING  Incomplete  Blood culture (routine x 2)     Status: None (Preliminary result)   Collection Time: 07/24/14  8:56 AM  Result Value Ref Range Status   Specimen Description BLOOD LEFT HAND  Final   Special Requests BOTTLES DRAWN AEROBIC AND ANAEROBIC 6CC EACH  Final   Culture NO GROWTH 4 DAYS  Final   Report Status PENDING  Incomplete  MRSA PCR Screening     Status: Abnormal   Collection Time: 07/24/14  9:55 AM  Result Value Ref Range Status   MRSA by PCR POSITIVE (A) NEGATIVE Final    Comment:        The GeneXpert MRSA Assay (FDA approved for NASAL specimens only), is one component of a comprehensive MRSA colonization surveillance program. It is not intended to diagnose MRSA infection nor to guide or monitor treatment for MRSA infections. RESULT CALLED TO, READ BACK BY AND VERIFIED WITH: MCDANIEL,M. AT 1441 ON 07/24/2014 BY BAUGHAM,M.      Labs: Basic Metabolic Panel:  Recent  Labs Lab 07/24/14 0715 07/25/14 0537 07/26/14 0440 07/27/14 0458  NA 142 140 138 143  K 3.5* 3.7 3.9 3.9  CL 102 103 102 104  CO2 29 27 26 29   GLUCOSE 102* 146* 145* 126*  BUN 14 15 13 10   CREATININE 1.26* 0.69 0.63 0.55  CALCIUM 8.0*  8.2* 8.5 8.4  MG 1.7  --   --   --   PHOS 6.9*  --   --   --    Liver Function Tests:  Recent Labs Lab 07/24/14 0715 07/27/14 0458  AST 117* 21  ALT 123* 46*  ALKPHOS 117 94  BILITOT 0.4 0.5  PROT 6.7 5.6*  ALBUMIN 3.2* 2.5*   No results for input(s): LIPASE, AMYLASE in the last 168 hours. No results for input(s): AMMONIA in the last 168 hours. CBC:  Recent Labs Lab 07/24/14 0715 07/25/14 0537 07/28/14 0622  WBC 12.1* 21.5* 10.2  NEUTROABS 8.6*  --   --   HGB 14.4 13.5 13.8  HCT 46.8* 41.9 43.0  MCV 96.5 92.5 93.3  PLT 245 191 200   Cardiac Enzymes:  Recent Labs Lab 07/24/14 0638  TROPONINI <0.30   BNP: BNP (last 3 results) No results for input(s): PROBNP in the last 8760 hours. CBG: No results for input(s): GLUCAP in the last 168 hours.     Signed:  Leandria PiaVEGA, Derron Pipkins  [facility] 07/28/2014, 10:06 AM

## 2014-07-29 ENCOUNTER — Other Ambulatory Visit: Payer: Self-pay | Admitting: Nurse Practitioner

## 2014-07-29 ENCOUNTER — Telehealth: Payer: Self-pay | Admitting: Nurse Practitioner

## 2014-07-29 LAB — CULTURE, BLOOD (ROUTINE X 2)
Culture: NO GROWTH
Culture: NO GROWTH

## 2014-07-29 NOTE — Progress Notes (Signed)
UR chart review completed.  

## 2014-07-29 NOTE — Telephone Encounter (Signed)
Immunizations are documented in epic.

## 2014-07-29 NOTE — Telephone Encounter (Signed)
Please advise on refill- last seen 10/10/13.

## 2014-07-29 NOTE — Telephone Encounter (Signed)
no more refills without being seen  

## 2014-08-01 ENCOUNTER — Other Ambulatory Visit: Payer: Self-pay | Admitting: Nurse Practitioner

## 2014-08-01 NOTE — Telephone Encounter (Signed)
Please advise on refills.  

## 2014-08-02 ENCOUNTER — Telehealth: Payer: Self-pay | Admitting: Nurse Practitioner

## 2014-08-02 MED FILL — Medication: Qty: 1 | Status: AC

## 2014-08-07 MED ORDER — NYSTATIN 100000 UNIT/ML MT SUSP: 5.0000 mL | Freq: Four times a day (QID) | OROMUCOSAL | Status: DC

## 2014-08-07 NOTE — Telephone Encounter (Signed)
nystatoin rx sent to pharmacy

## 2014-08-09 ENCOUNTER — Telehealth: Payer: Self-pay | Admitting: Nurse Practitioner

## 2014-08-09 NOTE — Telephone Encounter (Signed)
Left detailed msg stating to use otc muccinex or delsym, to CB with any further questions. Will close encounter.

## 2014-08-09 NOTE — Telephone Encounter (Signed)
Stp pt has pain behind eyes, ringing in ears last night, now it feels like she is in a tunnel, pt has a hx of migraines advised this could be a migraine. Pt states it feels like a migraine. Pt states she will go lay down and take it easy, if she doesn't get better she will CB.

## 2014-08-09 NOTE — Telephone Encounter (Signed)
mucinex or delsym OTC

## 2014-09-04 ENCOUNTER — Other Ambulatory Visit: Payer: Self-pay | Admitting: Nurse Practitioner

## 2014-09-07 ENCOUNTER — Other Ambulatory Visit: Payer: Self-pay | Admitting: Nurse Practitioner

## 2014-09-09 ENCOUNTER — Other Ambulatory Visit: Payer: Self-pay | Admitting: Nurse Practitioner

## 2014-09-10 NOTE — Telephone Encounter (Signed)
Pt notified will need appt before refilling

## 2014-09-10 NOTE — Telephone Encounter (Signed)
Valium has not been filled here since 2014- ntbs

## 2014-09-10 NOTE — Telephone Encounter (Signed)
Denied refill ntbs

## 2014-09-10 NOTE — Telephone Encounter (Signed)
Last filled 08/03/14, last seen 02 /2015. If approved [facility]

## 2014-09-11 DIAGNOSIS — Z5181 Encounter for therapeutic drug level monitoring: Secondary | ICD-10-CM | POA: Diagnosis not present

## 2014-09-11 DIAGNOSIS — Z79899 Other long term (current) drug therapy: Secondary | ICD-10-CM | POA: Diagnosis not present

## 2014-09-11 DIAGNOSIS — G894 Chronic pain syndrome: Secondary | ICD-10-CM | POA: Diagnosis not present

## 2014-09-12 ENCOUNTER — Ambulatory Visit: Payer: Medicare Other | Admitting: Nurse Practitioner

## 2014-09-12 ENCOUNTER — Telehealth: Payer: Self-pay | Admitting: Nurse Practitioner

## 2014-09-12 NOTE — Telephone Encounter (Signed)
Appointment given for today with Mary Martin, FNP. 

## 2014-09-19 NOTE — Telephone Encounter (Signed)
Patient aware.

## 2014-10-03 ENCOUNTER — Telehealth: Payer: Self-pay | Admitting: Nurse Practitioner

## 2014-10-04 NOTE — Telephone Encounter (Signed)
According to chart valium has not been filled since 2013- will need to be seen to get meds

## 2014-10-07 NOTE — Telephone Encounter (Signed)
Left detailed message on voicemail.  

## 2014-10-10 ENCOUNTER — Other Ambulatory Visit: Payer: Self-pay | Admitting: Nurse Practitioner

## 2014-10-11 NOTE — Telephone Encounter (Signed)
Not seen in over 1 year 

## 2014-10-14 ENCOUNTER — Ambulatory Visit: Payer: Medicare Other | Admitting: Nurse Practitioner

## 2014-10-17 ENCOUNTER — Telehealth: Payer: Self-pay | Admitting: Nurse Practitioner

## 2014-10-17 ENCOUNTER — Emergency Department (HOSPITAL_COMMUNITY): Payer: Medicare Other

## 2014-10-17 ENCOUNTER — Emergency Department (HOSPITAL_COMMUNITY)
Admission: EM | Admit: 2014-10-17 | Discharge: 2014-10-17 | Disposition: A | Discharge status: Home / Self Care | Payer: Medicare Other | Attending: Emergency Medicine | Admitting: Emergency Medicine

## 2014-10-17 ENCOUNTER — Encounter (HOSPITAL_COMMUNITY): Payer: Self-pay | Admitting: *Deleted

## 2014-10-17 DIAGNOSIS — Z7951 Long term (current) use of inhaled steroids: Secondary | ICD-10-CM | POA: Diagnosis not present

## 2014-10-17 DIAGNOSIS — Y998 Other external cause status: Secondary | ICD-10-CM | POA: Insufficient documentation

## 2014-10-17 DIAGNOSIS — G8929 Other chronic pain: Secondary | ICD-10-CM | POA: Diagnosis not present

## 2014-10-17 DIAGNOSIS — S43401A Unspecified sprain of right shoulder joint, initial encounter: Secondary | ICD-10-CM | POA: Insufficient documentation

## 2014-10-17 DIAGNOSIS — K219 Gastro-esophageal reflux disease without esophagitis: Secondary | ICD-10-CM | POA: Diagnosis not present

## 2014-10-17 DIAGNOSIS — M25562 Pain in left knee: Secondary | ICD-10-CM | POA: Diagnosis not present

## 2014-10-17 DIAGNOSIS — S8992XA Unspecified injury of left lower leg, initial encounter: Secondary | ICD-10-CM | POA: Diagnosis not present

## 2014-10-17 DIAGNOSIS — M545 Low back pain, unspecified: Secondary | ICD-10-CM

## 2014-10-17 DIAGNOSIS — Y9389 Activity, other specified: Secondary | ICD-10-CM | POA: Diagnosis not present

## 2014-10-17 DIAGNOSIS — F329 Major depressive disorder, single episode, unspecified: Secondary | ICD-10-CM | POA: Insufficient documentation

## 2014-10-17 DIAGNOSIS — R569 Unspecified convulsions: Secondary | ICD-10-CM | POA: Insufficient documentation

## 2014-10-17 DIAGNOSIS — Z79899 Other long term (current) drug therapy: Secondary | ICD-10-CM | POA: Insufficient documentation

## 2014-10-17 DIAGNOSIS — S0990XA Unspecified injury of head, initial encounter: Secondary | ICD-10-CM | POA: Diagnosis not present

## 2014-10-17 DIAGNOSIS — Z8639 Personal history of other endocrine, nutritional and metabolic disease: Secondary | ICD-10-CM | POA: Diagnosis not present

## 2014-10-17 DIAGNOSIS — Z791 Long term (current) use of non-steroidal anti-inflammatories (NSAID): Secondary | ICD-10-CM | POA: Insufficient documentation

## 2014-10-17 DIAGNOSIS — Z9981 Dependence on supplemental oxygen: Secondary | ICD-10-CM | POA: Insufficient documentation

## 2014-10-17 DIAGNOSIS — R52 Pain, unspecified: Secondary | ICD-10-CM

## 2014-10-17 DIAGNOSIS — Y9289 Other specified places as the place of occurrence of the external cause: Secondary | ICD-10-CM | POA: Diagnosis not present

## 2014-10-17 DIAGNOSIS — Z87891 Personal history of nicotine dependence: Secondary | ICD-10-CM | POA: Diagnosis not present

## 2014-10-17 DIAGNOSIS — Z880 Allergy status to penicillin: Secondary | ICD-10-CM | POA: Diagnosis not present

## 2014-10-17 DIAGNOSIS — R404 Transient alteration of awareness: Secondary | ICD-10-CM | POA: Diagnosis not present

## 2014-10-17 DIAGNOSIS — S4991XA Unspecified injury of right shoulder and upper arm, initial encounter: Secondary | ICD-10-CM | POA: Diagnosis not present

## 2014-10-17 DIAGNOSIS — W1830XA Fall on same level, unspecified, initial encounter: Secondary | ICD-10-CM | POA: Diagnosis not present

## 2014-10-17 DIAGNOSIS — S8392XA Sprain of unspecified site of left knee, initial encounter: Secondary | ICD-10-CM | POA: Diagnosis not present

## 2014-10-17 DIAGNOSIS — F410 Panic disorder [episodic paroxysmal anxiety] without agoraphobia: Secondary | ICD-10-CM | POA: Diagnosis not present

## 2014-10-17 DIAGNOSIS — S3992XA Unspecified injury of lower back, initial encounter: Secondary | ICD-10-CM | POA: Insufficient documentation

## 2014-10-17 DIAGNOSIS — M25511 Pain in right shoulder: Secondary | ICD-10-CM | POA: Diagnosis not present

## 2014-10-17 DIAGNOSIS — J441 Chronic obstructive pulmonary disease with (acute) exacerbation: Secondary | ICD-10-CM | POA: Insufficient documentation

## 2014-10-17 DIAGNOSIS — S299XXA Unspecified injury of thorax, initial encounter: Secondary | ICD-10-CM | POA: Diagnosis not present

## 2014-10-17 DIAGNOSIS — R531 Weakness: Secondary | ICD-10-CM | POA: Diagnosis not present

## 2014-10-17 LAB — BASIC METABOLIC PANEL
Anion gap: 5 (ref 5–15)
BUN: 11 mg/dL (ref 6–23)
CALCIUM: 8.8 mg/dL (ref 8.4–10.5)
CO2: 29 mmol/L (ref 19–32)
Chloride: 105 mmol/L (ref 96–112)
Creatinine, Ser: 0.71 mg/dL (ref 0.50–1.10)
GLUCOSE: 93 mg/dL (ref 70–99)
POTASSIUM: 3.6 mmol/L (ref 3.5–5.1)
Sodium: 139 mmol/L (ref 135–145)

## 2014-10-17 LAB — CBC WITH DIFFERENTIAL/PLATELET
BASOS ABS: 0 10*3/uL (ref 0.0–0.1)
Basophils Relative: 0 % (ref 0–1)
EOS PCT: 2 % (ref 0–5)
Eosinophils Absolute: 0.2 10*3/uL (ref 0.0–0.7)
HCT: 49.2 % — ABNORMAL HIGH (ref 36.0–46.0)
HEMOGLOBIN: 16 g/dL — AB (ref 12.0–15.0)
LYMPHS ABS: 3.3 10*3/uL (ref 0.7–4.0)
Lymphocytes Relative: 27 % (ref 12–46)
MCH: 30.7 pg (ref 26.0–34.0)
MCHC: 32.5 g/dL (ref 30.0–36.0)
MCV: 94.4 fL (ref 78.0–100.0)
Monocytes Absolute: 1.1 10*3/uL — ABNORMAL HIGH (ref 0.1–1.0)
Monocytes Relative: 9 % (ref 3–12)
Neutro Abs: 7.6 10*3/uL (ref 1.7–7.7)
Neutrophils Relative %: 62 % (ref 43–77)
Platelets: 233 10*3/uL (ref 150–400)
RBC: 5.21 MIL/uL — AB (ref 3.87–5.11)
RDW: 13.5 % (ref 11.5–15.5)
WBC: 12.2 10*3/uL — ABNORMAL HIGH (ref 4.0–10.5)

## 2014-10-17 MED ORDER — LORAZEPAM 1 MG PO TABS: 1.0000 mg | ORAL_TABLET | Freq: Four times a day (QID) | ORAL | Status: DC | PRN

## 2014-10-17 MED ORDER — CYCLOBENZAPRINE HCL 5 MG PO TABS: 5.0000 mg | ORAL_TABLET | Freq: Three times a day (TID) | ORAL | Status: DC | PRN

## 2014-10-17 MED ORDER — MORPHINE SULFATE 4 MG/ML IJ SOLN
4.0000 mg | Freq: Once | INTRAMUSCULAR | Status: AC
  Administered 2014-10-17: 4 mg via INTRAVENOUS
  Filled 2014-10-17: qty 1

## 2014-10-17 MED ORDER — IPRATROPIUM-ALBUTEROL 0.5-2.5 (3) MG/3ML IN SOLN
3.0000 mL | Freq: Once | RESPIRATORY_TRACT | Status: AC
  Administered 2014-10-17: 3 mL via RESPIRATORY_TRACT
  Filled 2014-10-17: qty 3

## 2014-10-17 NOTE — Discharge Instructions (Signed)
Please be aware you may have another seizure ° °Do not drive until seen by your physician for your condition ° °Do not climb ladders/roofs/trees as a seizure can occur at that height and cause serious harm ° °Do not bathe/swim alone as a seizure can occur and cause serious harm ° °Please followup with your physician or neurologist for further testing and possible treatment ° ° °

## 2014-10-17 NOTE — Telephone Encounter (Signed)
Pt aware of provider feedback and states she will go to the ER today. She also states she doesn't have a neurologist, and that she has never had a seizure before.

## 2014-10-17 NOTE — ED Notes (Signed)
Pt back from xray dept. Requesting po fluids. Informed pt again, that we would have to wait for results, especially CT head. NAD

## 2014-10-17 NOTE — ED Notes (Addendum)
Pt had a seizure at 0200 and fell. No hx of seizures. Friend witnessed seizure per pt. Pain to lower back and right arm. Headache which is chronic, per pt. Stats ears feel like the ocean is going through the ears x 1 week. Chronic left knee pain also. NAD. Pt requesting pain medication on arrival. Pain to right shoulder and right upper arm, hurts worse with movement.  Pt states she feels a little SOB. Albuterol tx x 1 given by EMS. Pt has not been using home O2 since she quit smoking 09/26/14. RA O2 is currently 94-98%

## 2014-10-17 NOTE — Telephone Encounter (Signed)
Refilled flexeril- valium has not been filled here since 2014- will not refill- needs to go to ER to make sure does not have a bleed form hitting head- needs to follow up with neurologist for seizure ASAP.

## 2014-10-17 NOTE — ED Provider Notes (Signed)
CSN: [id number]     Arrival date & time 10/17/14  1221 History  This chart was scribed for Joya Gaskins, MD by Tonye Royalty, ED Scribe. This patient was seen in room APA09/APA09 and the patient's care was started at 1:30 PM.    Chief Complaint  Patient presents with  . Seizures   Patient is a 59 y.o. female presenting with seizures. The history is provided by the patient. No language interpreter was used.  Seizures Seizure activity on arrival: no   Seizure type:  Unable to specify Preceding symptoms: no sensation of an aura present   Initial focality:  Diffuse Episode characteristics: abnormal movements and tongue biting   Postictal symptoms: memory loss   Return to baseline: yes   Severity:  Moderate Duration: less than 5 minutes. Timing:  Once Number of seizures this episode:  1 Progression:  Resolved Context: not alcohol withdrawal and not fever   Context comment:  Denies recent benzo use/withdrawal Recent head injury:  No recent head injuries PTA treatment:  None History of seizures: no     HPI Comments: MONZERAT HANDLER is a 59 y.o. female who presents to the Emergency Department complaining of seizure at 0130 this morning. She states she has never had a seizure prior to this morning. She only remembers waking at 0215 at which time paramedics were there; she refused to come to ED at that time. Seizure was witnessed by a friend. She then woke with pain to right arm and shoulder, back, and left knee, "choppy" speech, headache, lightheadedness on rising, and dizziness. She is unsure if she fell from standing or otherwise during seizure. She notes history of back pain. She states she feels short of breath and states it feels similar to prior asthma. She states she feels weak in right arm and left leg due to pain. She reports prior CVA. She denies blood thinner use. She denies fever, vomiting, chest pain, or abdominal pain.  Friend who witnessed the seizure (I spoke to her via phone)  states the patient was in her recliner and began shaking and looked like she bit her tongue; she states the patient then fell from the recliner and hurt her right shoulder. Patient notes her tongue hurts. She states the seizure stopped on its own after less than 5 minutes.  Past Medical History  Diagnosis Date  . Fibromyalgia   . Asthma   . DJD (degenerative joint disease)   . Chronic pain   . Depression   . Anxiety   . Panic attack   . Sleep disorder   . DJD (degenerative joint disease)   . GERD (gastroesophageal reflux disease)   . Hyperlipidemia   . COPD (chronic obstructive pulmonary disease)   . On home oxygen therapy    Past Surgical History  Procedure Laterality Date  . Abdominal surgery    . Carpal tunnel release    . Ankle surgery    . Knee surgery    . Cesarean section    . Tonsillectomy    . Tubal ligation    . Back surgery     Family History  Problem Relation Age of Onset  . Cancer Mother     beast   History  Substance Use Topics  . Smoking status: Former Smoker -- 1.00 packs/day for 8 years    Types: Cigarettes    Quit date: 09/26/2013  . Smokeless tobacco: Never Used  . Alcohol Use: No     Comment: occ  OB History    Gravida Para Term Preterm AB TAB SAB Ectopic Multiple Living   3 2 2  1  1   2      Review of Systems  Constitutional: Negative for fever.  Respiratory: Positive for shortness of breath.   Cardiovascular: Negative for chest pain.  Gastrointestinal: Negative for abdominal pain.  Musculoskeletal: Positive for back pain.       Left knee pain Right arm pain  Neurological: Positive for dizziness, seizures, speech difficulty, light-headedness and headaches.  Hematological: Does not bruise/bleed easily.  Psychiatric/Behavioral: The patient is nervous/anxious.   All other systems reviewed and are negative.     Allergies  Mobic; Penicillins; and Sulfa antibiotics  Home Medications   Prior to Admission medications   Medication Sig  Start Date End Date Taking? Authorizing Provider  buPROPion (WELLBUTRIN XL) 150 MG 24 hr tablet Take 150 mg by mouth daily.   Yes Historical Provider, MD  diclofenac (VOLTAREN) 75 MG EC tablet Take 75 mg by mouth 2 (two) times daily.   Yes Historical Provider, MD  DULoxetine (CYMBALTA) 60 MG capsule TAKE ONE CAPSULE BY MOUTH EVERY DAY 07/29/14  Yes [name], FNP  fluticasone (FLONASE) 50 MCG/ACT nasal spray PLACE 2 SPRAYS INTO THE NOSE DAILY. 10/11/14  Yes [name], FNP  Fluticasone Furoate-Vilanterol 100-25 MCG/INH AEPB Inhale 1 puff into the lungs daily. 07/08/14  Yes Ernestina Pennaonald W Moore, MD  HYDROmorphone HCl (EXALGO) 12 MG T24A SR tablet Take 12 mg by mouth daily as needed (overdose).  10/21/14 11/20/14 Yes Historical Provider, MD  levalbuterol Pauline Aus(XOPENEX HFA) 45 MCG/ACT inhaler Inhale 2 puffs into the lungs every 8 (eight) hours as needed for wheezing or shortness of breath. 07/28/14  Yes Naliyah Piarlando Vega, MD  Naloxone HCl (EVZIO) 0.4 MG/0.4ML SOAJ Inject 0.4 mLs as directed once.    Yes Historical Provider, MD  omeprazole (PRILOSEC) 20 MG capsule Take 1 capsule (20 mg total) by mouth daily. 04/13/13  Yes Ernestina Pennaonald W Moore, MD  PATANOL 0.1 % ophthalmic solution PLACE 1 DROP INTO BOTH EYES 2 (TWO) TIMES DAILY. 07/03/14  Yes [name], FNP  PROAIR HFA 108 (90 BASE) MCG/ACT inhaler Inhale 2 puffs into the lungs daily as needed for wheezing or shortness of breath.  09/29/14  Yes Historical Provider, MD  tiotropium (SPIRIVA) 18 MCG inhalation capsule Place 18 mcg into inhaler and inhale daily.   Yes Historical Provider, MD  traMADol (ULTRAM) 50 MG tablet Take 1-2 tablets (50-100 mg total) by mouth every 6 (six) hours as needed. pain 07/28/14  Yes Desaree Piarlando Vega, MD  LORazepam (ATIVAN) 1 MG tablet Take 1 tablet (1 mg total) by mouth every 6 (six) hours as needed for anxiety. 10/17/14   Joya Gaskinsonald W [place] Lapaglia, MD   BP 126/77 mmHg  Pulse 102  Temp(Src) 99 F (37.2 C) (Oral)  Resp 20  SpO2  94% Physical Exam  Nursing note and vitals reviewed. CONSTITUTIONAL: Well developed/well nourished HEAD: Normocephalic/atraumatic EYES: EOMI/PERRL ENMT: Mucous membranes moist, no tongue lacerations NECK: supple no meningeal signs SPINE/BACK:tenderness to lumbar spine, no other spinal tenderness, No bruising/crepitance/stepoffs noted to spine CV: S1/S2 noted, no murmurs/rubs/gallops noted LUNGS:, no apparent distress, wheezing bilaterally ABDOMEN: soft, nontender, no rebound or guarding, bowel sounds noted throughout abdomen GU:no cva tenderness NEURO: Pt is awake/alert/appropriate, moves all extremitiesx4.  No facial droop.  fluent speech noted, no focal weakness in extremities EXTREMITIES: pulses normal/equal, full ROM, tenderness to palpation of right shoulder and left knee, no deformities, All other extremities/joints palpated/ranged  and nontender  SKIN: warm, color normal PSYCH: anxious  ED Course  Procedures   DIAGNOSTIC STUDIES: Oxygen Saturation is 94% on room air, adequate by my interpretation.    COORDINATION OF CARE: 1:42 PM Discussed treatment plan with patient at beside, including x-ray of chest left knee, right shoulder, back, CT of head, and breathing treatment. The patient agrees with the plan and has no further questions at this time. 4:05 PM Imaging negative for acute disease She is in no distress She can ambulate (some limitation due to pain in left knee) but no ataxia No arm/leg weakness noted She can range right shoulder without difficulty I feel she is safe for d/c home - advised need to avoid driving/bathing due to h/o seizure Referred to neuro for further workup/testing She requests short course of meds for anxiety She feels safe at home and will have friend stay with her tonight   Labs Review Labs Reviewed  CBC WITH DIFFERENTIAL/PLATELET - Abnormal; Notable for the following:    WBC 12.2 (*)    RBC 5.21 (*)    Hemoglobin 16.0 (*)    HCT 49.2 (*)     Monocytes Absolute 1.1 (*)    All other components within normal limits  BASIC METABOLIC PANEL    Imaging Review Dg Chest 1 View  10/17/2014   CLINICAL DATA:  Injury during recent seizure activity, initial encounter  EXAM: CHEST  1 VIEW  COMPARISON:  07/25/2014  FINDINGS: Cardiac shadow is at the upper limits of normal in size but stable. No focal infiltrate or sizable effusion is seen. No acute bony abnormality is noted.  IMPRESSION: No acute abnormality seen.   Electronically Signed   By: Alcide Clever M.D.   On: 10/17/2014 15:05   Dg Lumbar Spine Complete  10/17/2014   CLINICAL DATA:  Recent injury during seizure with low back pain, initial encounter  EXAM: LUMBAR SPINE - COMPLETE 4+ VIEW  COMPARISON:  None.  FINDINGS: Five lumbar type vertebral bodies are well visualized. Vertebral body height is well maintained. Multilevel osteophytic changes are seen. Mild disc space narrowing is noted at L4-5. Mild aortic calcifications are noted without aneurysmal dilatation.  IMPRESSION: Degenerative change without acute abnormality.   Electronically Signed   By: Alcide Clever M.D.   On: 10/17/2014 15:04   Dg Shoulder Right  10/17/2014   CLINICAL DATA:  Recent fall during seizure with right shoulder pain, initial encounter  EXAM: RIGHT SHOULDER - 2+ VIEW  COMPARISON:  None.  FINDINGS: Mild degenerative changes of the acromioclavicular joint are seen. No acute fracture or dislocation is noted.  IMPRESSION: No acute abnormality noted.   Electronically Signed   By: Alcide Clever M.D.   On: 10/17/2014 15:01   Ct Head Wo Contrast  10/17/2014   CLINICAL DATA:  Pt had a seizure at 0200 and fell. No hx of seizures. Friend witnessed seizure per pt. Pain to lower back and right arm. Headache which is chronic, per pt. Stats ears feel like the ocean is going through the ears x 1 week. Chronic left knee pain also. NAD. Pt requesting pain medication on arrival. Pain to right shoulder and right upper arm, hurts worse with  movement. Pt states she feels a little SOB. Albuterol tx x 1 given by EMS. Pt has not been using home O2 since she quit smoking 09/26/14.  EXAM: CT HEAD WITHOUT CONTRAST  TECHNIQUE: Contiguous axial images were obtained from the base of the skull through the vertex without intravenous  contrast.  COMPARISON:  07/24/2014  FINDINGS: Ventricles normal in size and configuration.  There are no parenchymal masses or mass effect. No evidence of an infarct. There are no extra-axial masses or abnormal fluid collections.  There is no intracranial hemorrhage.  Visualized sinuses and mastoid air cells are clear. No skull lesion.  IMPRESSION: Normal unenhanced CT scan of the brain.   Electronically Signed   By: Amie Portland M.D.   On: 10/17/2014 15:09   Dg Knee Complete 4 Views Left  10/17/2014   CLINICAL DATA:  Recent injury during seizure with left knee pain, initial encounter  EXAM: LEFT KNEE - COMPLETE 4+ VIEW  COMPARISON:  03/27/2012  FINDINGS: Small joint effusion is noted. Tricompartmental degenerative changes are seen. These have progressed somewhat in the interval from the prior exam. No acute fracture or dislocation is noted.  IMPRESSION: Increasing degenerative change with small effusion. No acute bony abnormality is noted.   Electronically Signed   By: Alcide Clever M.D.   On: 10/17/2014 15:03     EKG Interpretation   Date/Time:  Thursday October 17 2014 13:59:23 EST Ventricular Rate:  99 PR Interval:  143 QRS Duration: 147 QT Interval:  398 QTC Calculation: 511 R Axis:   -102 Text Interpretation:  Sinus rhythm IVCD, consider atypical RBBB Inferior  infarct, old Lateral leads are also involved Abnormal ekg Confirmed by  Bebe Shaggy  MD, Kyheem Bathgate ([id number]) on 10/17/2014 2:25:27 PM       Medications  ipratropium-albuterol (DUONEB) 0.5-2.5 (3) MG/3ML nebulizer solution 3 mL (3 mLs Nebulization Given 10/17/14 1409)  morphine 4 MG/ML injection 4 mg (4 mg Intravenous Given 10/17/14 1350)    MDM   Final  diagnoses:  Pain  Anxiety attack  Seizure  COPD exacerbation  Shoulder sprain, right, initial encounter  Left knee sprain, initial encounter  Midline low back pain without sciatica    Nursing notes including past medical history and social history reviewed and considered in documentation xrays/imaging reviewed by myself and considered during evaluation Labs/vital reviewed myself and considered during evaluation Previous records reviewed and considered    I personally performed the services described in this documentation, which was scribed in my presence. The recorded information has been reviewed and is accurate.      Joya Gaskins, MD 10/17/14 [phone]

## 2014-10-21 ENCOUNTER — Telehealth: Payer: Self-pay | Admitting: Nurse Practitioner

## 2014-10-21 NOTE — Telephone Encounter (Signed)
Appointment given for Thursday with Mary Martin, FNP. 

## 2014-10-24 ENCOUNTER — Telehealth: Payer: Self-pay | Admitting: Nurse Practitioner

## 2014-10-24 ENCOUNTER — Ambulatory Visit (INDEPENDENT_AMBULATORY_CARE_PROVIDER_SITE_OTHER): Payer: Medicare Other | Admitting: Nurse Practitioner

## 2014-10-24 ENCOUNTER — Encounter: Payer: Self-pay | Admitting: Nurse Practitioner

## 2014-10-24 VITALS — BP 131/90 | HR 115 | Temp 97.8°F | Ht 66.0 in | Wt 215.4 lb

## 2014-10-24 DIAGNOSIS — G4452 New daily persistent headache (NDPH): Secondary | ICD-10-CM | POA: Diagnosis not present

## 2014-10-24 DIAGNOSIS — R569 Unspecified convulsions: Secondary | ICD-10-CM | POA: Diagnosis not present

## 2014-10-24 MED ORDER — BUPROPION HCL ER (XL) 150 MG PO TB24: 150.0000 mg | ORAL_TABLET | Freq: Every day | ORAL | Status: DC

## 2014-10-24 MED ORDER — DIAZEPAM 5 MG PO TABS: 5.0000 mg | ORAL_TABLET | Freq: Two times a day (BID) | ORAL | Status: DC | PRN

## 2014-10-24 MED ORDER — PROAIR HFA 108 (90 BASE) MCG/ACT IN AERS: 2.0000 | INHALATION_SPRAY | Freq: Every day | RESPIRATORY_TRACT | Status: DC | PRN

## 2014-10-24 MED ORDER — OMEPRAZOLE 20 MG PO CPDR: 20.0000 mg | DELAYED_RELEASE_CAPSULE | Freq: Every day | ORAL | Status: DC

## 2014-10-24 MED ORDER — DULOXETINE HCL 60 MG PO CPEP: 60.0000 mg | ORAL_CAPSULE | Freq: Every day | ORAL | Status: DC

## 2014-10-24 NOTE — Progress Notes (Signed)
Subjective:    Patient ID: Bethany Villa, female    DOB: 09-19-1955, 59 y.o.   MRN: [id number]  [facility] a [facility] about a week [facility]- EMS was called and patient refused to go to hospital. Micah Flesher to ER the next day as advised by our office- They did lots of test including x ray of shoulder and knee- All test were negative. SHe was discharged home and told not to drive until she could see neurologist. Today she has no complaints other than still being sore from falling out of chair during seizure. SHe has had no further episodes. SHe states that this has never occurred previously. Patient says that she has been having headaches daily for 2 months and they are gradually worsening. Blurred vision daily.    Review of Systems  HENT: Negative.   Respiratory: Negative.   Cardiovascular: Negative.   Gastrointestinal: Negative.   Genitourinary: Negative.   Musculoskeletal: Positive for arthralgias.  Neurological: Negative.   Psychiatric/Behavioral: Negative.   All other systems reviewed and are negative.      Objective:   Physical Exam  Constitutional: She is oriented to person, place, and time. She appears well-developed and well-nourished.  Cardiovascular: Normal rate, regular rhythm and normal heart sounds.   Pulmonary/Chest: Effort normal and breath sounds normal.  Neurological: She is alert and oriented to person, place, and time. She has normal reflexes. No cranial nerve deficit.  Skin: Skin is warm and dry.  Psychiatric: She has a normal mood and affect. Her behavior is normal. Judgment and thought content normal.   BP 131/90 mmHg  Pulse 115  Temp(Src) 97.8 F (36.6 C) (Oral)  Ht  (1.676 m)  Wt 215 lb 6.4 oz (97.705 kg)  BMI 34.78 kg/m2        Assessment & Plan:   1. Convulsions, unspecified convulsion type   2. New daily persistent headache    Orders Placed This Encounter  Procedures  . Ambulatory referral  to Neurology    Referral Priority:  Urgent    Referral Type:  Consultation    Referral Reason:  Specialty Services Required    Requested Specialty:  Neurology    Number of Visits Requested:  1   Meds ordered this encounter  Medications  . cyclobenzaprine (FLEXERIL) 5 MG tablet    Sig:     Refill:  0  . DISCONTD: diazepam (VALIUM) 5 MG tablet    Sig: Take 1 tablet (5 mg total) by mouth every 12 (twelve) hours as needed for anxiety.    Dispense:  30 tablet    Refill:  1    Order Specific Question:  Supervising Provider    Answer:  Ernestina Penna [[id number]]  . diazepam (VALIUM) 5 MG tablet    Sig: Take 1 tablet (5 mg total) by mouth every 12 (twelve) hours as needed for anxiety.    Dispense:  60 tablet    Refill:  1    Order Specific Question:  Supervising Provider    Answer:  Ernestina Penna [[id number]]   Meds ordered this encounter  Medications  . cyclobenzaprine (FLEXERIL) 5 MG tablet    Sig:     Refill:  0  . DISCONTD: diazepam (VALIUM) 5 MG tablet    Sig: Take 1 tablet (5 mg total) by mouth every 12 (twelve) hours as needed for anxiety.    Dispense:  30 tablet    Refill:  1    Order Specific Question:  Supervising Provider    Answer:  Ernestina PennaMOORE, DONALD W [[id number]]  . diazepam (VALIUM) 5 MG tablet    Sig: Take 1 tablet (5 mg total) by mouth every 12 (twelve) hours as needed for anxiety.    Dispense:  60 tablet    Refill:  1    Order Specific Question:  Supervising Provider    Answer:  Ernestina PennaMOORE, DONALD W [[id number]]  . buPROPion (WELLBUTRIN XL) 150 MG 24 hr tablet    Sig: Take 1 tablet (150 mg total) by mouth daily.    Dispense:  30 tablet    Refill:  5    Order Specific Question:  Supervising Provider    Answer:  Ernestina PennaMOORE, DONALD W [[id number]]  . DULoxetine (CYMBALTA) 60 MG capsule    Sig: Take 1 capsule (60 mg total) by mouth daily.    Dispense:  30 capsule    Refill:  5    Order Specific Question:  Supervising Provider    Answer:  Ernestina PennaMOORE, DONALD W [[id number]]  . omeprazole (PRILOSEC) 20 MG  capsule    Sig: Take 1 capsule (20 mg total) by mouth daily.    Dispense:  90 capsule    Refill:  1    Order Specific Question:  Supervising Provider    Answer:  Ernestina PennaMOORE, DONALD W [[id number]]  . PROAIR HFA 108 (90 BASE) MCG/ACT inhaler    Sig: Inhale 2 puffs into the lungs daily as needed for wheezing or shortness of breath.    Dispense:  1 Inhaler    Refill:  2    Order Specific Question:  Supervising Provider    Answer:  Deborra MedinaMOORE, DONALD W [[id number]]      STOP SMOKING!!! ER records reviewed If have another seizure go to ER Do not drive until cleared by neurologist RTO prn  [name], FNP

## 2014-10-24 NOTE — Patient Instructions (Signed)

## 2014-10-24 NOTE — Telephone Encounter (Signed)
Please review and advise Did not see on med list

## 2014-10-25 ENCOUNTER — Other Ambulatory Visit: Payer: Self-pay | Admitting: Nurse Practitioner

## 2014-10-25 ENCOUNTER — Other Ambulatory Visit: Payer: Self-pay | Admitting: *Deleted

## 2014-10-25 MED ORDER — ALBUTEROL SULFATE (2.5 MG/3ML) 0.083% IN NEBU: INHALATION_SOLUTION | RESPIRATORY_TRACT | Status: DC

## 2014-11-04 ENCOUNTER — Other Ambulatory Visit: Payer: Self-pay | Admitting: Nurse Practitioner

## 2014-11-06 DIAGNOSIS — G894 Chronic pain syndrome: Secondary | ICD-10-CM | POA: Diagnosis not present

## 2014-11-06 DIAGNOSIS — M25511 Pain in right shoulder: Secondary | ICD-10-CM | POA: Diagnosis not present

## 2014-11-18 ENCOUNTER — Telehealth: Payer: Self-pay | Admitting: Nurse Practitioner

## 2014-11-18 MED ORDER — ONDANSETRON HCL 4 MG PO TABS: 4.0000 mg | ORAL_TABLET | Freq: Three times a day (TID) | ORAL | Status: DC | PRN

## 2014-11-18 NOTE — Telephone Encounter (Signed)
zofran rx sent to pharmacy

## 2014-11-18 NOTE — Telephone Encounter (Signed)
Has done referral fo rneuro and needs to see them first. Can take awhile fo rreferral

## 2014-11-19 ENCOUNTER — Other Ambulatory Visit: Payer: Self-pay | Admitting: Family Medicine

## 2014-11-19 ENCOUNTER — Telehealth: Payer: Self-pay | Admitting: Nurse Practitioner

## 2014-11-19 ENCOUNTER — Other Ambulatory Visit: Payer: Self-pay | Admitting: Nurse Practitioner

## 2014-11-19 MED ORDER — FLUTICASONE PROPIONATE 50 MCG/ACT NA SUSP: 2.0000 | Freq: Every day | NASAL | Status: DC

## 2014-11-19 MED ORDER — CYCLOBENZAPRINE HCL 10 MG PO TABS: 10.0000 mg | ORAL_TABLET | Freq: Three times a day (TID) | ORAL | Status: DC | PRN

## 2014-11-19 MED ORDER — DIAZEPAM 5 MG PO TABS: 5.0000 mg | ORAL_TABLET | Freq: Two times a day (BID) | ORAL | Status: DC | PRN

## 2014-11-19 NOTE — Telephone Encounter (Signed)
Detailed message left for patient that rx has been sent to pharmacy. 

## 2014-11-19 NOTE — Telephone Encounter (Signed)
Flexeril rx sent to pharamcy

## 2014-11-19 NOTE — Telephone Encounter (Signed)
Please call in valium 0.5 1 po BID #60 with 1 refills

## 2014-11-19 NOTE — Telephone Encounter (Signed)
According to referral note, neurology office has tried to contact patient but has been unsuccessful. I did provide patient with their phone number and a contact person so that she can schedule this appointment.  She also requested that we send in another prescription for Flexeril. Current Rx is for 5mg  TID #30. This lasted her 10 days. She states that she normally takes 10mg  and she is requesting a 30 day supply of the 10mg  strength.

## 2014-11-20 NOTE — Telephone Encounter (Signed)
[facility] and left on [facility].

## 2014-11-26 ENCOUNTER — Other Ambulatory Visit: Payer: Self-pay | Admitting: Nurse Practitioner

## 2014-11-27 NOTE — Telephone Encounter (Signed)
Last seen 10/24/13  MMM  

## 2014-11-29 ENCOUNTER — Telehealth: Payer: Self-pay

## 2014-11-29 NOTE — Telephone Encounter (Signed)
Ondansetron approved 08/29/14 through 11/27/15  [facility]

## 2014-12-02 ENCOUNTER — Other Ambulatory Visit: Payer: Self-pay | Admitting: Nurse Practitioner

## 2014-12-02 DIAGNOSIS — G894 Chronic pain syndrome: Secondary | ICD-10-CM | POA: Diagnosis not present

## 2014-12-02 DIAGNOSIS — M4696 Unspecified inflammatory spondylopathy, lumbar region: Secondary | ICD-10-CM | POA: Diagnosis not present

## 2014-12-09 ENCOUNTER — Other Ambulatory Visit: Payer: Self-pay | Admitting: Nurse Practitioner

## 2014-12-26 ENCOUNTER — Telehealth: Payer: Self-pay

## 2014-12-27 ENCOUNTER — Ambulatory Visit: Payer: Medicare Other | Admitting: Neurology

## 2014-12-30 NOTE — Telephone Encounter (Signed)
x

## 2015-01-03 ENCOUNTER — Telehealth: Payer: Self-pay | Admitting: Nurse Practitioner

## 2015-01-06 ENCOUNTER — Telehealth: Payer: Self-pay

## 2015-01-06 NOTE — Telephone Encounter (Signed)
Patient called again about setting up an MRI for her  Wants EDen  She says the pain Dr wants an MRI

## 2015-01-06 NOTE — Telephone Encounter (Signed)
MRI ordered on 12/10/14- please check on this and call patient

## 2015-01-08 ENCOUNTER — Other Ambulatory Visit: Payer: Self-pay

## 2015-01-08 DIAGNOSIS — I639 Cerebral infarction, unspecified: Secondary | ICD-10-CM

## 2015-01-09 ENCOUNTER — Other Ambulatory Visit: Payer: Self-pay | Admitting: Nurse Practitioner

## 2015-01-15 ENCOUNTER — Other Ambulatory Visit: Payer: Self-pay | Admitting: Nurse Practitioner

## 2015-01-16 NOTE — Telephone Encounter (Signed)
Last seen 10/24/14 MMM If approved route to nurse [facility]

## 2015-01-16 NOTE — Telephone Encounter (Signed)
Please call in valium with 1 refills 

## 2015-01-16 NOTE — Telephone Encounter (Signed)
[facility] Okayed per MMM 

## 2015-01-20 ENCOUNTER — Ambulatory Visit: Payer: Medicare Other | Admitting: Nurse Practitioner

## 2015-01-29 DIAGNOSIS — M4696 Unspecified inflammatory spondylopathy, lumbar region: Secondary | ICD-10-CM | POA: Diagnosis not present

## 2015-01-29 DIAGNOSIS — G894 Chronic pain syndrome: Secondary | ICD-10-CM | POA: Diagnosis not present

## 2015-01-31 ENCOUNTER — Telehealth: Payer: Self-pay | Admitting: Nurse Practitioner

## 2015-01-31 ENCOUNTER — Other Ambulatory Visit: Payer: Self-pay | Admitting: Nurse Practitioner

## 2015-01-31 MED ORDER — PROAIR HFA 108 (90 BASE) MCG/ACT IN AERS: 2.0000 | INHALATION_SPRAY | Freq: Every day | RESPIRATORY_TRACT | Status: DC | PRN

## 2015-01-31 MED ORDER — LORAZEPAM 1 MG PO TABS: 1.0000 mg | ORAL_TABLET | Freq: Four times a day (QID) | ORAL | Status: DC | PRN

## 2015-01-31 NOTE — Telephone Encounter (Signed)
If she is on valium she does not get ativan also

## 2015-01-31 NOTE — Telephone Encounter (Signed)
Please call in valium 1mg  1 po prn #10  with 0 refills

## 2015-01-31 NOTE — Telephone Encounter (Signed)
Pt notified of RX only for 10 pills per MMM Verbalizes understanding

## 2015-01-31 NOTE — Telephone Encounter (Signed)
[facility] Patient notified 

## 2015-01-31 NOTE — Telephone Encounter (Signed)
FYI- MMM  

## 2015-02-10 ENCOUNTER — Other Ambulatory Visit: Payer: Self-pay | Admitting: Nurse Practitioner

## 2015-02-10 ENCOUNTER — Other Ambulatory Visit: Payer: Self-pay | Admitting: *Deleted

## 2015-02-10 MED ORDER — DIAZEPAM 5 MG PO TABS: 5.0000 mg | ORAL_TABLET | Freq: Two times a day (BID) | ORAL | Status: DC

## 2015-02-10 NOTE — Telephone Encounter (Signed)
Last filled 01/16/2015 for # 60 and then on 01/31/2015 # 10

## 2015-02-10 NOTE — Telephone Encounter (Signed)
Patient must wait until refill is due. No early refill.

## 2015-02-10 NOTE — Telephone Encounter (Signed)
[facility] 

## 2015-02-18 ENCOUNTER — Telehealth: Payer: Self-pay | Admitting: Nurse Practitioner

## 2015-02-18 MED ORDER — CYCLOBENZAPRINE HCL 10 MG PO TABS: ORAL_TABLET | ORAL | Status: DC

## 2015-02-18 NOTE — Telephone Encounter (Signed)
Please review and advise.

## 2015-02-18 NOTE — Telephone Encounter (Signed)
Flexeril [facility]

## 2015-02-21 NOTE — Patient Outreach (Signed)
[facility]) Care Management  02/21/2015  Bethany Villa 1955-11-24 [id number]   Referral from High Risk List, assigned Colleen Can, RN to outreach.  [name]. [name] [facility] Care Management [facility] CM Assistant Phone: [phone] Fax: [phone]

## 2015-02-26 ENCOUNTER — Other Ambulatory Visit: Payer: Self-pay | Admitting: Nurse Practitioner

## 2015-02-26 MED ORDER — BENZONATATE 100 MG PO CAPS: ORAL_CAPSULE | ORAL | Status: DC

## 2015-02-26 MED ORDER — LEVALBUTEROL TARTRATE 45 MCG/ACT IN AERO: INHALATION_SPRAY | RESPIRATORY_TRACT | Status: DC

## 2015-02-26 NOTE — Telephone Encounter (Signed)
rx sent to pharmacy

## 2015-02-26 NOTE — Telephone Encounter (Signed)
Pt.notified

## 2015-02-26 NOTE — Telephone Encounter (Signed)
Please review and advise.

## 2015-02-26 NOTE — Telephone Encounter (Signed)
Pt is coughing and wheezing No A/C per pt Wants refill on Tessalon Perles and Albuterol inhaler Please advise

## 2015-03-07 ENCOUNTER — Other Ambulatory Visit: Payer: Self-pay | Admitting: Nurse Practitioner

## 2015-03-07 NOTE — Telephone Encounter (Signed)
Last seen 10/24/14 MMM

## 2015-03-10 ENCOUNTER — Other Ambulatory Visit: Payer: Self-pay | Admitting: *Deleted

## 2015-03-10 NOTE — Patient Outreach (Signed)
[facility]) Care Management  03/10/2015  Basilio Cairoenny E Mannan 12/08/1955 [id number]  High Risk Referral: Telephone call to patient; left message on voice mail requesting return call.  Plan: will follow up. Colleen CanLinda Valentine Barney, RN BSN CCM Care Management Coordinator [facility] Care [facility]  [phone]

## 2015-03-17 ENCOUNTER — Other Ambulatory Visit: Payer: Self-pay | Admitting: *Deleted

## 2015-03-17 ENCOUNTER — Encounter: Payer: Self-pay | Admitting: *Deleted

## 2015-03-17 NOTE — Patient Outreach (Signed)
[facility]) Care Management  03/17/2015  Basilio Cairoenny E Pekala 03/02/1956 [id number]  High Risk list referral:  Telephone call to patient who was advised of reason for call and of [facility] services. Patient states she is in the process of moving and does not wish to start our services at this time. States however she is interested in services at a later date. Unable to answer assessment questions now.   Plan: Will send letter and [facility] broch[facility] with contact information. Patient may call back at her convenience. Will close out case.   Colleen CanLinda Sairah Knobloch, RN BSN CCM Care Management Coordinator [facility] Care [facility]  [phone]

## 2015-03-19 ENCOUNTER — Encounter: Payer: Self-pay | Admitting: *Deleted

## 2015-03-20 ENCOUNTER — Other Ambulatory Visit: Payer: Self-pay | Admitting: Nurse Practitioner

## 2015-03-21 NOTE — Telephone Encounter (Signed)
Please call in valium with 0 refills 

## 2015-03-21 NOTE — Telephone Encounter (Signed)
rx called into pharmacy

## 2015-03-25 NOTE — Patient Outreach (Signed)
[facility]) Care Management  03/25/2015  YANIL DAWE [birth year] [id number]   Notification from Colleen Can, RN to close case due to patient refused [facility] Care Management services.  [name]. [name] [facility] Care Management [facility] CM Assistant Phone: [phone] Fax: [phone]

## 2015-03-27 ENCOUNTER — Other Ambulatory Visit: Payer: Self-pay | Admitting: Nurse Practitioner

## 2015-04-01 ENCOUNTER — Telehealth: Payer: Self-pay | Admitting: Nurse Practitioner

## 2015-04-01 DIAGNOSIS — M797 Fibromyalgia: Secondary | ICD-10-CM | POA: Diagnosis not present

## 2015-04-01 DIAGNOSIS — Z5181 Encounter for therapeutic drug level monitoring: Secondary | ICD-10-CM | POA: Diagnosis not present

## 2015-04-01 DIAGNOSIS — G894 Chronic pain syndrome: Secondary | ICD-10-CM | POA: Diagnosis not present

## 2015-04-01 DIAGNOSIS — M25511 Pain in right shoulder: Secondary | ICD-10-CM | POA: Diagnosis not present

## 2015-04-01 DIAGNOSIS — M4696 Unspecified inflammatory spondylopathy, lumbar region: Secondary | ICD-10-CM | POA: Diagnosis not present

## 2015-04-01 DIAGNOSIS — Z79899 Other long term (current) drug therapy: Secondary | ICD-10-CM | POA: Diagnosis not present

## 2015-04-01 MED ORDER — DIAZEPAM 5 MG PO TABS: 5.0000 mg | ORAL_TABLET | ORAL | Status: DC | PRN

## 2015-04-01 NOTE — Telephone Encounter (Signed)
Valium rx ready for pick up Give patient list of counselors

## 2015-04-02 NOTE — Telephone Encounter (Signed)
lmovm that refill & list of providers is at the front desk ready for pickup

## 2015-04-07 ENCOUNTER — Telehealth: Payer: Self-pay | Admitting: Nurse Practitioner

## 2015-04-11 ENCOUNTER — Telehealth: Payer: Self-pay | Admitting: Nurse Practitioner

## 2015-04-11 ENCOUNTER — Other Ambulatory Visit: Payer: Self-pay | Admitting: Nurse Practitioner

## 2015-04-11 MED ORDER — DIAZEPAM 5 MG PO TABS: 5.0000 mg | ORAL_TABLET | ORAL | Status: DC | PRN

## 2015-04-11 NOTE — Telephone Encounter (Signed)
Last seen 10/24/14 MMM 

## 2015-04-11 NOTE — Telephone Encounter (Signed)
Flexeril refill denied- just had filled on 03/27/15

## 2015-04-11 NOTE — Telephone Encounter (Signed)
Have patient come pick up valium rx Flexeril was just filled on 03/27/15 #60 to early for refill

## 2015-04-11 NOTE — Telephone Encounter (Signed)
Patient notified

## 2015-04-14 ENCOUNTER — Telehealth: Payer: Self-pay | Admitting: Nurse Practitioner

## 2015-04-15 NOTE — Telephone Encounter (Signed)
Call handled in another encounter

## 2015-04-15 NOTE — Telephone Encounter (Signed)
Got flexeril filled on 03/27/15- IT IS TOO SOON to have refilled.

## 2015-04-15 NOTE — Telephone Encounter (Signed)
Patient aware and verbalizes understanding. 

## 2015-04-16 NOTE — Telephone Encounter (Signed)
Patient was advised in another telephone encounter 

## 2015-04-30 ENCOUNTER — Other Ambulatory Visit: Payer: Self-pay | Admitting: Nurse Practitioner

## 2015-05-02 ENCOUNTER — Telehealth: Payer: Self-pay | Admitting: Nurse Practitioner

## 2015-05-02 MED ORDER — DICLOFENAC SODIUM 75 MG PO TBEC: 75.0000 mg | DELAYED_RELEASE_TABLET | Freq: Two times a day (BID) | ORAL | Status: DC

## 2015-05-02 NOTE — Telephone Encounter (Signed)
voltaren sent to pharmacy

## 2015-05-05 ENCOUNTER — Other Ambulatory Visit: Payer: Self-pay | Admitting: Family Medicine

## 2015-05-05 ENCOUNTER — Other Ambulatory Visit: Payer: Self-pay | Admitting: Nurse Practitioner

## 2015-05-06 ENCOUNTER — Other Ambulatory Visit: Payer: Self-pay | Admitting: Nurse Practitioner

## 2015-05-06 ENCOUNTER — Encounter: Payer: Self-pay | Admitting: *Deleted

## 2015-05-06 MED ORDER — DICLOFENAC SODIUM 1 % TD GEL: 2.0000 g | Freq: Four times a day (QID) | TRANSDERMAL | Status: DC

## 2015-05-06 NOTE — Telephone Encounter (Signed)
Last seen 10/24/14 MMM 

## 2015-05-06 NOTE — Telephone Encounter (Signed)
voltaren gel sent to pharmacy

## 2015-05-06 NOTE — Telephone Encounter (Signed)
No lipid panel in Epic

## 2015-05-06 NOTE — Telephone Encounter (Signed)
lmovm that gel Rx has been sent to pharmacy

## 2015-05-06 NOTE — Telephone Encounter (Signed)
Please advise 

## 2015-05-06 NOTE — Telephone Encounter (Signed)
no more refills without being seen  

## 2015-05-07 NOTE — Telephone Encounter (Signed)
Patient aware.

## 2015-05-27 DIAGNOSIS — M797 Fibromyalgia: Secondary | ICD-10-CM | POA: Diagnosis not present

## 2015-05-27 DIAGNOSIS — G894 Chronic pain syndrome: Secondary | ICD-10-CM | POA: Diagnosis not present

## 2015-05-27 DIAGNOSIS — M25511 Pain in right shoulder: Secondary | ICD-10-CM | POA: Diagnosis not present

## 2015-05-27 DIAGNOSIS — M4696 Unspecified inflammatory spondylopathy, lumbar region: Secondary | ICD-10-CM | POA: Diagnosis not present

## 2015-05-29 ENCOUNTER — Other Ambulatory Visit: Payer: Self-pay | Admitting: Nurse Practitioner

## 2015-05-29 NOTE — Telephone Encounter (Signed)
Last seen 10/24/14 MMM 

## 2015-05-29 NOTE — Telephone Encounter (Signed)
MMM filled RX 04-01-15

## 2015-05-29 NOTE — Telephone Encounter (Signed)
30 days of meds sent in. She needs to be seen before any more prescriptions are given. Has not been seen for her depression in over a year.

## 2015-06-24 DIAGNOSIS — G894 Chronic pain syndrome: Secondary | ICD-10-CM | POA: Diagnosis not present

## 2015-06-24 DIAGNOSIS — M25562 Pain in left knee: Secondary | ICD-10-CM | POA: Diagnosis not present

## 2015-06-24 DIAGNOSIS — Z5181 Encounter for therapeutic drug level monitoring: Secondary | ICD-10-CM | POA: Diagnosis not present

## 2015-06-24 DIAGNOSIS — Z79899 Other long term (current) drug therapy: Secondary | ICD-10-CM | POA: Diagnosis not present

## 2015-06-24 DIAGNOSIS — M546 Pain in thoracic spine: Secondary | ICD-10-CM | POA: Diagnosis not present

## 2015-06-24 DIAGNOSIS — M797 Fibromyalgia: Secondary | ICD-10-CM | POA: Diagnosis not present

## 2015-06-25 ENCOUNTER — Telehealth: Payer: Self-pay | Admitting: Nurse Practitioner

## 2015-06-25 NOTE — Telephone Encounter (Signed)
Pt quit taking Valium "cold [place]" Now very emotional, crying spells Informed pt she would need to be seen She will call back to schedule appt

## 2015-07-02 ENCOUNTER — Other Ambulatory Visit: Payer: Self-pay | Admitting: Nurse Practitioner

## 2015-07-02 ENCOUNTER — Other Ambulatory Visit: Payer: Self-pay

## 2015-07-02 NOTE — Telephone Encounter (Signed)
Aware, xopenex refill done.

## 2015-07-03 NOTE — Telephone Encounter (Signed)
Last seen 10/24/14 MMM 

## 2015-07-10 ENCOUNTER — Encounter: Payer: Self-pay | Admitting: Nurse Practitioner

## 2015-07-10 ENCOUNTER — Ambulatory Visit (INDEPENDENT_AMBULATORY_CARE_PROVIDER_SITE_OTHER): Payer: Medicare Other | Admitting: Nurse Practitioner

## 2015-07-10 VITALS — BP 141/96 | HR 108 | Temp 96.8°F | Ht 66.0 in | Wt 188.0 lb

## 2015-07-10 DIAGNOSIS — J411 Mucopurulent chronic bronchitis: Secondary | ICD-10-CM

## 2015-07-10 MED ORDER — BENZONATATE 100 MG PO CAPS: 100.0000 mg | ORAL_CAPSULE | Freq: Three times a day (TID) | ORAL | Status: DC | PRN

## 2015-07-10 MED ORDER — LEVOFLOXACIN 500 MG PO TABS: 500.0000 mg | ORAL_TABLET | Freq: Every day | ORAL | Status: DC

## 2015-07-10 MED ORDER — METHYLPREDNISOLONE ACETATE 80 MG/ML IJ SUSP
80.0000 mg | Freq: Once | INTRAMUSCULAR | Status: AC
  Administered 2015-07-10: 80 mg via INTRAMUSCULAR

## 2015-07-10 NOTE — Patient Instructions (Signed)

## 2015-07-10 NOTE — Progress Notes (Signed)
  Subjective:     Bethany Villa is a 59 y.o. female here for evaluation of a cough. Onset of symptoms was 2 weeks ago. Symptoms have been gradually worsening since that time. The cough is barky, dry, hoarse and productive and is aggravated by nothing. Associated symptoms include: fever and sore throat. Patient does have a history of asthma. Patient does have a history of environmental allergens. Patient has not traveled recently. Patient does have a history of smoking. Patient has had a previous chest x-ray. Patient has not had a PPD done.  The following portions of the patient's history were reviewed and updated as appropriate: allergies, current medications, past family history, past medical history, past social history, past surgical history and problem list.  Review of Systems Pertinent items are noted in HPI.    Objective:     BP 141/96 mmHg  Pulse 108  Temp(Src) 96.8 F (36 C) (Oral)  Ht 5\' 6"  (1.676 m)  Wt 188 lb (85.276 kg)  BMI 30.36 kg/m2  SpO2 98% General appearance: alert and cooperative Eyes: conjunctivae/corneas clear. PERRL, EOM's intact. Fundi benign. Ears: normal TM's and external ear canals both ears Nose: clear discharge, severe congestion, no sinus tenderness Throat: lips, mucosa, and tongue normal; teeth and gums normal Lungs: wheezes bilaterally Heart: regular rate and rhythm, S1, S2 normal, no murmur, click, rub or gallop    Assessment:    COPD with exacerbation    Plan:   1. Take meds as prescribed 2. Use a cool mist humidifier especially during the winter months and when heat has been humid. 3. Use saline nose sprays frequently 4. Saline irrigations of the nose can be very helpful if done frequently.  * 4X daily for 1 week*  * Use of a nettie pot can be helpful with this. Follow directions with this* 5. Drink plenty of fluids 6. Keep thermostat turn down low 7.For any cough or congestion  Use plain Mucinex- regular strength or max strength is  fine   * Children- consult with Pharmacist for dosing 8. For fever or aces or pains- take tylenol or ibuprofen appropriate for age and weight.  * for fevers greater than 101 orally you may alternate ibuprofen and tylenol every  3 hours.   Meds ordered this encounter  Medications  . levofloxacin (LEVAQUIN) 500 MG tablet    Sig: Take 1 tablet (500 mg total) by mouth daily.    Dispense:  10 tablet    Refill:  0    Order Specific Question:  Supervising Provider    Answer:  Ernestina PennaMOORE, DONALD W [[id number]]  . benzonatate (TESSALON) 100 MG capsule    Sig: Take 1 capsule (100 mg total) by mouth 3 (three) times daily as needed for cough.    Dispense:  20 capsule    Refill:  0    Order Specific Question:  Supervising Provider    Answer:  Ernestina PennaMOORE, DONALD W [[id number]]  . methylPREDNISolone acetate (DEPO-MEDROL) injection 80 mg    Sig:    [name], FNP

## 2015-07-11 ENCOUNTER — Telehealth: Payer: Self-pay | Admitting: Nurse Practitioner

## 2015-07-11 NOTE — Telephone Encounter (Signed)
Stp and she wanted to make sure it was ok to take allegra D instead of zyrtec D and if she should continue taking her flonase. Advised pt ok to take allegra D as it is much cheaper and ok to use the flonase as well. Pt voiced understanding.

## 2015-07-15 ENCOUNTER — Other Ambulatory Visit: Payer: Self-pay | Admitting: Nurse Practitioner

## 2015-07-21 ENCOUNTER — Other Ambulatory Visit: Payer: Self-pay | Admitting: Nurse Practitioner

## 2015-07-21 DIAGNOSIS — M546 Pain in thoracic spine: Secondary | ICD-10-CM | POA: Diagnosis not present

## 2015-07-21 DIAGNOSIS — G894 Chronic pain syndrome: Secondary | ICD-10-CM | POA: Diagnosis not present

## 2015-07-21 DIAGNOSIS — M25562 Pain in left knee: Secondary | ICD-10-CM | POA: Diagnosis not present

## 2015-07-21 DIAGNOSIS — Z79899 Other long term (current) drug therapy: Secondary | ICD-10-CM | POA: Diagnosis not present

## 2015-07-21 DIAGNOSIS — M545 Low back pain: Secondary | ICD-10-CM | POA: Diagnosis not present

## 2015-07-21 DIAGNOSIS — Z5181 Encounter for therapeutic drug level monitoring: Secondary | ICD-10-CM | POA: Diagnosis not present

## 2015-07-21 DIAGNOSIS — J42 Unspecified chronic bronchitis: Secondary | ICD-10-CM

## 2015-07-21 NOTE — Telephone Encounter (Signed)
rx sent for tubing and mask Should nit need another antibiotic- the one given is very strong- if did nit help then is viral and will just have to run its course.

## 2015-07-21 NOTE — Telephone Encounter (Signed)
[facility] detailed [facility] with any further questions or concerns.

## 2015-07-22 ENCOUNTER — Telehealth: Payer: Self-pay | Admitting: Nurse Practitioner

## 2015-07-22 ENCOUNTER — Other Ambulatory Visit: Payer: Self-pay | Admitting: Nurse Practitioner

## 2015-07-22 NOTE — Telephone Encounter (Signed)
Stp and advised of provider feedback. Pt voiced understanding. 

## 2015-07-22 NOTE — Telephone Encounter (Signed)
Patient states that her drug screen showed up positive for benzo and she states that she has not had valium since October. Her pain clinic wanted you to look at her medications and see if anything she is on would cause this. She is also requesting a drug screen. Please advise

## 2015-07-22 NOTE — Telephone Encounter (Signed)
There is nothing on her list that would show up as benzo

## 2015-07-23 NOTE — Telephone Encounter (Signed)
Last seen 10/24/14 MMM 

## 2015-07-25 NOTE — Telephone Encounter (Signed)
We do not do refills on antibiotics- will need to be seen if no better

## 2015-07-28 ENCOUNTER — Other Ambulatory Visit: Payer: Self-pay | Admitting: Nurse Practitioner

## 2015-07-28 ENCOUNTER — Telehealth: Payer: Self-pay | Admitting: Nurse Practitioner

## 2015-07-28 DIAGNOSIS — Z79899 Other long term (current) drug therapy: Secondary | ICD-10-CM | POA: Diagnosis not present

## 2015-07-28 DIAGNOSIS — G894 Chronic pain syndrome: Secondary | ICD-10-CM | POA: Diagnosis not present

## 2015-07-29 ENCOUNTER — Telehealth: Payer: Self-pay | Admitting: Nurse Practitioner

## 2015-07-29 NOTE — Telephone Encounter (Signed)
Spoke to pt

## 2015-07-30 ENCOUNTER — Telehealth: Payer: Self-pay | Admitting: Nurse Practitioner

## 2015-07-31 ENCOUNTER — Other Ambulatory Visit: Payer: Self-pay | Admitting: Nurse Practitioner

## 2015-07-31 NOTE — Telephone Encounter (Signed)
Unable to reach patient by phone. Message left. Appt scheduled for 12/2 at 12:15. She should call back if she needs to cancel or reschedule.  I'm not certain which medication she is referring to. We haven't prescribed Tramadol in a year.

## 2015-07-31 NOTE — Telephone Encounter (Signed)
i have not given her any painmeds in quite sometime- she will need to be seen- wasn't she going to pain clinic at one time?

## 2015-08-01 ENCOUNTER — Encounter: Payer: Self-pay | Admitting: Nurse Practitioner

## 2015-08-01 ENCOUNTER — Ambulatory Visit (INDEPENDENT_AMBULATORY_CARE_PROVIDER_SITE_OTHER): Payer: Medicare Other | Admitting: Nurse Practitioner

## 2015-08-01 VITALS — BP 125/86 | HR 103 | Temp 97.1°F | Ht 66.0 in | Wt 192.0 lb

## 2015-08-01 DIAGNOSIS — E785 Hyperlipidemia, unspecified: Secondary | ICD-10-CM | POA: Diagnosis not present

## 2015-08-01 DIAGNOSIS — F32A Depression, unspecified: Secondary | ICD-10-CM

## 2015-08-01 DIAGNOSIS — E669 Obesity, unspecified: Secondary | ICD-10-CM

## 2015-08-01 DIAGNOSIS — J42 Unspecified chronic bronchitis: Secondary | ICD-10-CM

## 2015-08-01 DIAGNOSIS — G894 Chronic pain syndrome: Secondary | ICD-10-CM | POA: Diagnosis not present

## 2015-08-01 DIAGNOSIS — F329 Major depressive disorder, single episode, unspecified: Secondary | ICD-10-CM | POA: Diagnosis not present

## 2015-08-01 DIAGNOSIS — I639 Cerebral infarction, unspecified: Secondary | ICD-10-CM | POA: Diagnosis not present

## 2015-08-01 DIAGNOSIS — M797 Fibromyalgia: Secondary | ICD-10-CM | POA: Diagnosis not present

## 2015-08-01 DIAGNOSIS — G4700 Insomnia, unspecified: Secondary | ICD-10-CM

## 2015-08-01 DIAGNOSIS — J029 Acute pharyngitis, unspecified: Secondary | ICD-10-CM | POA: Diagnosis not present

## 2015-08-01 DIAGNOSIS — F411 Generalized anxiety disorder: Secondary | ICD-10-CM | POA: Diagnosis not present

## 2015-08-01 DIAGNOSIS — R739 Hyperglycemia, unspecified: Secondary | ICD-10-CM

## 2015-08-01 MED ORDER — NYSTATIN 100000 UNIT/ML MT SUSP
5.0000 mL | Freq: Four times a day (QID) | OROMUCOSAL | Status: DC
Start: 2015-08-01 — End: 2015-09-10

## 2015-08-01 MED ORDER — CEFDINIR 300 MG PO CAPS: 300.0000 mg | ORAL_CAPSULE | Freq: Two times a day (BID) | ORAL | Status: DC

## 2015-08-01 MED ORDER — TIOTROPIUM BROMIDE MONOHYDRATE 18 MCG IN CAPS: 18.0000 ug | ORAL_CAPSULE | Freq: Every day | RESPIRATORY_TRACT | Status: DC

## 2015-08-01 MED ORDER — BUPROPION HCL ER (XL) 150 MG PO TB24: ORAL_TABLET | ORAL | Status: DC

## 2015-08-01 MED ORDER — ATORVASTATIN CALCIUM 20 MG PO TABS: ORAL_TABLET | ORAL | Status: DC

## 2015-08-01 MED ORDER — DULOXETINE HCL 60 MG PO CPEP: ORAL_CAPSULE | ORAL | Status: DC

## 2015-08-01 NOTE — Progress Notes (Signed)
   Subjective:    Patient ID: Bethany Villa, female    DOB: April 15, 1956, 59 y.o.   MRN: [id number]  HPI Patient in today for follow up of chronic medical problems.  Was seen several weeks ago with sore throat and is no better despite taking antibiotic.  -COPD- currently on spirivia and albuterol- uses albuterol very seldom- maybe 1-2 x a week- especially when she goes outside- patient is a smoker. -fibromyalgia/ chronic pain disorder- has had for years- is on flexeril on occasion- she is also on dilaudid that she gets from pain clinic- she failed a drug screen- had benzo in her system- we have not filled her valium in years. -Depression/GAD- currently on cymbalta  And wellbutrin- works well to keep her for feeling down. -insomnia- currently not taking anything.   Review of Systems  Constitutional: Negative.   HENT: Negative.   Respiratory: Negative.   Cardiovascular: Negative.   Genitourinary: Negative.   Neurological: Negative.   Psychiatric/Behavioral: Negative.   All other systems reviewed and are negative.      Objective:   Physical Exam  Constitutional: She is oriented to person, place, and time. She appears well-developed and well-nourished.  HENT:  Post oral pharynx erythematous- tongue raw appearing and dry  Cardiovascular: Normal rate, regular rhythm and normal heart sounds.   Pulmonary/Chest: Effort normal.  Musculoskeletal:  Multiple areas of point tenderness up and down back  Neurological: She is alert and oriented to person, place, and time.  Skin: Skin is warm.  Psychiatric: She has a normal mood and affect. Her behavior is normal. Judgment and thought content normal.   BP 125/86 mmHg  Pulse 103  Temp(Src) 97.1 F (36.2 C) (Oral)  Ht $R'5\' 6"'cG$  (1.676 m)  Wt 192 lb (87.091 kg)  BMI 31.00 kg/m2        Assessment & Plan:   1. Chronic bronchitis, unspecified chronic bronchitis type ([place]) Smoking cessation - tiotropium (SPIRIVA) 18 MCG inhalation capsule; Place  1 capsule (18 mcg total) into inhaler and inhale daily.  Dispense: 30 capsule; Refill: 5  2. Fibromyalgia Continue pain clinic  3. Chronic pain disorder Continue pain clinic  4. Obesity Discussed diet and exercise for person with BMI >25 Will recheck weight in 3-6 months   5. Hyperglycemia Low fat diet  6. Insomnia Bedtime ritual  7. GAD (generalized anxiety disorder) Stress management NO valium due to pain clinic  8. Depression Stress management - DULoxetine (CYMBALTA) 60 MG capsule; TAKE 1 CAPSULE (60 MG TOTAL) BY MOUTH DAILY.  Dispense: 30 capsule; Refill: 5 - buPROPion (WELLBUTRIN XL) 150 MG 24 hr tablet; TAKE 1 TABLET (150 MG TOTAL) BY MOUTH DAILY.  Dispense: 30 tablet; Refill: 5  9. Acute pharyngitis, unspecified etiology Force fluids - cefdinir (OMNICEF) 300 MG capsule; Take 1 capsule (300 mg total) by mouth 2 (two) times daily. 1 po BID  Dispense: 20 capsule; Refill: 0 - nystatin (MYCOSTATIN) 100000 UNIT/ML suspension; Take 5 mLs (500,000 Units total) by mouth 4 (four) times daily.  Dispense: 60 mL; Refill: 0  10. Hyperlipidemia with target LDL less than 100 Low fat diet - atorvastatin (LIPITOR) 20 MG tablet; TAKE 1 TABLET (20 MG TOTAL) BY MOUTH DAILY.  Dispense: 90 tablet; Refill: 1 - CMP14+EGFR - Lipid panel  Mary-Margaret Hassell Done, FNP

## 2015-08-01 NOTE — Patient Instructions (Signed)
Stress and Stress Management Stress is a normal reaction to life events. It is what you feel when life demands more than you are used to or more than you can handle. Some stress can be useful. For example, the stress reaction can help you catch the last bus of the day, study for a test, or meet a deadline at work. But stress that occurs too often or for too long can cause problems. It can affect your emotional health and interfere with relationships and normal daily activities. Too much stress can weaken your immune system and increase your risk for physical illness. If you already have a medical problem, stress can make it worse. CAUSES  All sorts of life events may cause stress. An event that causes stress for one person may not be stressful for another person. Major life events commonly cause stress. These may be positive or negative. Examples include losing your job, moving into a new home, getting married, having a baby, or losing a loved one. Less obvious life events may also cause stress, especially if they occur day after day or in combination. Examples include working long hours, driving in traffic, caring for children, being in debt, or being in a difficult relationship. SIGNS AND SYMPTOMS Stress may cause emotional symptoms including, the following:  Anxiety. This is feeling worried, afraid, on edge, overwhelmed, or out of control.  Anger. This is feeling irritated or impatient.  Depression. This is feeling sad, down, helpless, or guilty.  Difficulty focusing, remembering, or making decisions. Stress may cause physical symptoms, including the following:   Aches and pains. These may affect your head, neck, back, stomach, or other areas of your body.  Tight muscles or clenched jaw.  Low energy or trouble sleeping. Stress may cause unhealthy behaviors, including the following:   Eating to feel better (overeating) or skipping meals.  Sleeping too little, too much, or both.  Working  too much or putting off tasks (procrastination).  Smoking, drinking alcohol, or using drugs to feel better. DIAGNOSIS  Stress is diagnosed through an assessment by your health care provider. Your health care provider will ask questions about your symptoms and any stressful life events.Your health care provider will also ask about your medical history and may order blood tests or other tests. Certain medical conditions and medicine can cause physical symptoms similar to stress. Mental illness can cause emotional symptoms and unhealthy behaviors similar to stress. Your health care provider may refer you to a mental health professional for further evaluation.  TREATMENT  Stress management is the recommended treatment for stress.The goals of stress management are reducing stressful life events and coping with stress in healthy ways.  Techniques for reducing stressful life events include the following:  Stress identification. Self-monitor for stress and identify what causes stress for you. These skills may help you to avoid some stressful events.  Time management. Set your priorities, keep a calendar of events, and learn to say "no." These tools can help you avoid making too many commitments. Techniques for coping with stress include the following:  Rethinking the problem. Try to think realistically about stressful events rather than ignoring them or overreacting. Try to find the positives in a stressful situation rather than focusing on the negatives.  Exercise. Physical exercise can release both physical and emotional tension. The key is to find a form of exercise you enjoy and do it regularly.  Relaxation techniques. These relax the body and mind. Examples include yoga, meditation, tai chi, biofeedback, deep  breathing, progressive muscle relaxation, listening to music, being out in nature, journaling, and other hobbies. Again, the key is to find one or more that you enjoy and can do  regularly.  Healthy lifestyle. Eat a balanced diet, get plenty of sleep, and do not smoke. Avoid using alcohol or drugs to relax.  Strong support network. Spend time with family, friends, or other people you enjoy being around.Express your feelings and talk things over with someone you trust. Counseling or talktherapy with a mental health professional may be helpful if you are having difficulty managing stress on your own. Medicine is typically not recommended for the treatment of stress.Talk to your health care provider if you think you need medicine for symptoms of stress. HOME CARE INSTRUCTIONS  Keep all follow-up visits as directed by your health care provider.  Take all medicines as directed by your health care provider. SEEK MEDICAL CARE IF:  Your symptoms get worse or you start having new symptoms.  You feel overwhelmed by your problems and can no longer manage them on your own. SEEK IMMEDIATE MEDICAL CARE IF:  You feel like hurting yourself or someone else.   This information is not intended to replace advice given to you by your health care provider. Make sure you discuss any questions you have with your health care provider.   Document Released: 02/09/2001 Document Revised: 09/06/2014 Document Reviewed: 04/10/2013 Elsevier Interactive Patient Education 2016 Elsevier Inc.  

## 2015-08-02 LAB — CMP14+EGFR
ALBUMIN: 3.9 g/dL (ref 3.5–5.5)
ALT: 72 IU/L — AB (ref 0–32)
AST: 59 IU/L — ABNORMAL HIGH (ref 0–40)
Albumin/Globulin Ratio: 1.6 (ref 1.1–2.5)
Alkaline Phosphatase: 110 IU/L (ref 39–117)
BILIRUBIN TOTAL: 0.4 mg/dL (ref 0.0–1.2)
BUN / CREAT RATIO: 16 (ref 9–23)
BUN: 12 mg/dL (ref 6–24)
CALCIUM: 9.1 mg/dL (ref 8.7–10.2)
CHLORIDE: 104 mmol/L (ref 97–106)
CO2: 22 mmol/L (ref 18–29)
CREATININE: 0.74 mg/dL (ref 0.57–1.00)
GFR calc non Af Amer: 90 mL/min/{1.73_m2} (ref >59)
GFR, EST AFRICAN AMERICAN: 103 mL/min/{1.73_m2} (ref >59)
GLUCOSE: 95 mg/dL (ref 65–99)
Globulin, Total: 2.4 g/dL (ref 1.5–4.5)
Potassium: 4.6 mmol/L (ref 3.5–5.2)
Sodium: 143 mmol/L (ref 136–144)
TOTAL PROTEIN: 6.3 g/dL (ref 6.0–8.5)

## 2015-08-02 LAB — LIPID PANEL
Chol/HDL Ratio: 4.1 ratio units (ref 0.0–4.4)
Cholesterol, Total: 186 mg/dL (ref 100–199)
HDL: 45 mg/dL (ref >39)
LDL CALC: 122 mg/dL — AB (ref 0–99)
Triglycerides: 96 mg/dL (ref 0–149)
VLDL CHOLESTEROL CAL: 19 mg/dL (ref 5–40)

## 2015-08-04 NOTE — Telephone Encounter (Signed)
Patient saw MMM on 12-2 and issue was addressed

## 2015-08-05 ENCOUNTER — Telehealth: Payer: Self-pay | Admitting: Nurse Practitioner

## 2015-08-05 ENCOUNTER — Other Ambulatory Visit: Payer: Self-pay | Admitting: Nurse Practitioner

## 2015-08-05 DIAGNOSIS — J411 Mucopurulent chronic bronchitis: Secondary | ICD-10-CM

## 2015-08-05 MED ORDER — BENZONATATE 100 MG PO CAPS: 100.0000 mg | ORAL_CAPSULE | Freq: Three times a day (TID) | ORAL | Status: DC | PRN

## 2015-08-05 NOTE — Telephone Encounter (Signed)
Patient aware.

## 2015-08-05 NOTE — Telephone Encounter (Signed)
Tessalon perles rx sent to pharmacy 

## 2015-08-12 ENCOUNTER — Telehealth: Payer: Self-pay | Admitting: Nurse Practitioner

## 2015-08-12 NOTE — Telephone Encounter (Signed)
Patient called stating that she wanted a refill on Clonidine. Informed patient that I did not see this Rx on her medication list.  Patient states that she gets this medication from the Pain clinic.  Informed patient that she will need to have that refilled through the pain clinic.  Patient also stated that she needed something to take for her nerves.  Informed patient that and Rx for cymbalta was just given to her on 12/2 with 5 refills.  Patient verbalized understanding.

## 2015-08-15 ENCOUNTER — Telehealth: Payer: Self-pay | Admitting: Nurse Practitioner

## 2015-08-15 NOTE — Telephone Encounter (Signed)
Has to be seen in order to get controlled meds

## 2015-08-15 NOTE — Telephone Encounter (Signed)
Pt given appt with MMM 12/19 at 3:30.

## 2015-08-18 ENCOUNTER — Telehealth: Payer: Self-pay | Admitting: Nurse Practitioner

## 2015-08-18 ENCOUNTER — Ambulatory Visit: Payer: Medicare Other | Admitting: Nurse Practitioner

## 2015-08-18 ENCOUNTER — Other Ambulatory Visit: Payer: Self-pay | Admitting: Nurse Practitioner

## 2015-08-19 NOTE — Telephone Encounter (Signed)
Spoke with pt regarding RX for Valium Pt's speech was very slurred Very difficult to understand Informed pt we could not RX Valium per MMM's last note Also confirmed by MMM

## 2015-08-19 NOTE — Telephone Encounter (Signed)
NTBS.

## 2015-09-01 ENCOUNTER — Inpatient Hospital Stay (HOSPITAL_COMMUNITY)
Admission: EM | Admit: 2015-09-01 | Discharge: 2015-09-10 | DRG: 871 | Disposition: A | Discharge status: Home Health | Payer: Medicare Other | Attending: Internal Medicine | Admitting: Internal Medicine

## 2015-09-01 ENCOUNTER — Encounter (HOSPITAL_COMMUNITY): Payer: Self-pay

## 2015-09-01 ENCOUNTER — Emergency Department (HOSPITAL_COMMUNITY): Payer: Medicare Other

## 2015-09-01 ENCOUNTER — Inpatient Hospital Stay (HOSPITAL_COMMUNITY): Payer: Medicare Other

## 2015-09-01 DIAGNOSIS — A419 Sepsis, unspecified organism: Secondary | ICD-10-CM | POA: Diagnosis not present

## 2015-09-01 DIAGNOSIS — M1990 Unspecified osteoarthritis, unspecified site: Secondary | ICD-10-CM | POA: Diagnosis not present

## 2015-09-01 DIAGNOSIS — Z791 Long term (current) use of non-steroidal anti-inflammatories (NSAID): Secondary | ICD-10-CM | POA: Diagnosis not present

## 2015-09-01 DIAGNOSIS — A403 Sepsis due to Streptococcus pneumoniae: Principal | ICD-10-CM | POA: Diagnosis present

## 2015-09-01 DIAGNOSIS — J189 Pneumonia, unspecified organism: Secondary | ICD-10-CM | POA: Diagnosis not present

## 2015-09-01 DIAGNOSIS — K219 Gastro-esophageal reflux disease without esophagitis: Secondary | ICD-10-CM | POA: Diagnosis present

## 2015-09-01 DIAGNOSIS — J90 Pleural effusion, not elsewhere classified: Secondary | ICD-10-CM | POA: Diagnosis present

## 2015-09-01 DIAGNOSIS — G9340 Encephalopathy, unspecified: Secondary | ICD-10-CM | POA: Diagnosis present

## 2015-09-01 DIAGNOSIS — J9601 Acute respiratory failure with hypoxia: Secondary | ICD-10-CM | POA: Diagnosis present

## 2015-09-01 DIAGNOSIS — G9341 Metabolic encephalopathy: Secondary | ICD-10-CM

## 2015-09-01 DIAGNOSIS — E876 Hypokalemia: Secondary | ICD-10-CM | POA: Diagnosis not present

## 2015-09-01 DIAGNOSIS — R634 Abnormal weight loss: Secondary | ICD-10-CM | POA: Diagnosis not present

## 2015-09-01 DIAGNOSIS — J9602 Acute respiratory failure with hypercapnia: Secondary | ICD-10-CM | POA: Diagnosis present

## 2015-09-01 DIAGNOSIS — Z9981 Dependence on supplemental oxygen: Secondary | ICD-10-CM | POA: Diagnosis not present

## 2015-09-01 DIAGNOSIS — J440 Chronic obstructive pulmonary disease with acute lower respiratory infection: Secondary | ICD-10-CM | POA: Diagnosis present

## 2015-09-01 DIAGNOSIS — R062 Wheezing: Secondary | ICD-10-CM | POA: Diagnosis not present

## 2015-09-01 DIAGNOSIS — R05 Cough: Secondary | ICD-10-CM | POA: Diagnosis not present

## 2015-09-01 DIAGNOSIS — F17210 Nicotine dependence, cigarettes, uncomplicated: Secondary | ICD-10-CM | POA: Diagnosis present

## 2015-09-01 DIAGNOSIS — I509 Heart failure, unspecified: Secondary | ICD-10-CM | POA: Diagnosis not present

## 2015-09-01 DIAGNOSIS — Z7951 Long term (current) use of inhaled steroids: Secondary | ICD-10-CM

## 2015-09-01 DIAGNOSIS — G8929 Other chronic pain: Secondary | ICD-10-CM | POA: Diagnosis present

## 2015-09-01 DIAGNOSIS — F419 Anxiety disorder, unspecified: Secondary | ICD-10-CM | POA: Diagnosis present

## 2015-09-01 DIAGNOSIS — J13 Pneumonia due to Streptococcus pneumoniae: Secondary | ICD-10-CM | POA: Diagnosis present

## 2015-09-01 DIAGNOSIS — E785 Hyperlipidemia, unspecified: Secondary | ICD-10-CM | POA: Diagnosis present

## 2015-09-01 DIAGNOSIS — F329 Major depressive disorder, single episode, unspecified: Secondary | ICD-10-CM | POA: Diagnosis not present

## 2015-09-01 DIAGNOSIS — R0600 Dyspnea, unspecified: Secondary | ICD-10-CM | POA: Diagnosis present

## 2015-09-01 DIAGNOSIS — J9600 Acute respiratory failure, unspecified whether with hypoxia or hypercapnia: Secondary | ICD-10-CM | POA: Insufficient documentation

## 2015-09-01 DIAGNOSIS — R0682 Tachypnea, not elsewhere classified: Secondary | ICD-10-CM | POA: Diagnosis not present

## 2015-09-01 DIAGNOSIS — M797 Fibromyalgia: Secondary | ICD-10-CM | POA: Diagnosis present

## 2015-09-01 DIAGNOSIS — Z79899 Other long term (current) drug therapy: Secondary | ICD-10-CM | POA: Diagnosis not present

## 2015-09-01 DIAGNOSIS — J9690 Respiratory failure, unspecified, unspecified whether with hypoxia or hypercapnia: Secondary | ICD-10-CM

## 2015-09-01 HISTORY — DX: Reserved for inherently not codable concepts without codable children: IMO0001

## 2015-09-01 LAB — BLOOD GAS, ARTERIAL
Acid-Base Excess: 0 mmol/L (ref 0.0–2.0)
BICARBONATE: 23.9 meq/L (ref 20.0–24.0)
Drawn by: 21310
O2 CONTENT: 4 L/min
O2 SAT: 90.8 %
PCO2 ART: 44.8 mmHg (ref 35.0–45.0)
PH ART: 7.361 (ref 7.350–7.450)
PO2 ART: 64.2 mmHg — AB (ref 80.0–100.0)
TCO2: 18.9 mmol/L (ref 0–100)

## 2015-09-01 LAB — URINALYSIS, ROUTINE W REFLEX MICROSCOPIC
GLUCOSE, UA: 100 mg/dL — AB
HGB URINE DIPSTICK: NEGATIVE
KETONES UR: NEGATIVE mg/dL
Nitrite: NEGATIVE
PH: 6.5 (ref 5.0–8.0)
Protein, ur: 100 mg/dL — AB
Specific Gravity, Urine: 1.01 (ref 1.005–1.030)

## 2015-09-01 LAB — CBC WITH DIFFERENTIAL/PLATELET
BASOS ABS: 0 10*3/uL (ref 0.0–0.1)
Basophils Relative: 0 %
Eosinophils Absolute: 0 10*3/uL (ref 0.0–0.7)
Eosinophils Relative: 0 %
HCT: 44 % (ref 36.0–46.0)
HEMOGLOBIN: 14.9 g/dL (ref 12.0–15.0)
LYMPHS ABS: 1.2 10*3/uL (ref 0.7–4.0)
Lymphocytes Relative: 7 %
MCH: 30.1 pg (ref 26.0–34.0)
MCHC: 33.9 g/dL (ref 30.0–36.0)
MCV: 88.9 fL (ref 78.0–100.0)
MONO ABS: 0.3 10*3/uL (ref 0.1–1.0)
MONOS PCT: 2 %
NEUTROS PCT: 91 %
Neutro Abs: 15.8 10*3/uL — ABNORMAL HIGH (ref 1.7–7.7)
PLATELETS: 139 10*3/uL — AB (ref 150–400)
RBC: 4.95 MIL/uL (ref 3.87–5.11)
RDW: 15.5 % (ref 11.5–15.5)
WBC: 17.3 10*3/uL — AB (ref 4.0–10.5)

## 2015-09-01 LAB — BASIC METABOLIC PANEL
Anion gap: 13 (ref 5–15)
BUN: 19 mg/dL (ref 6–20)
CO2: 25 mmol/L (ref 22–32)
Calcium: 8.3 mg/dL — ABNORMAL LOW (ref 8.9–10.3)
Chloride: 96 mmol/L — ABNORMAL LOW (ref 101–111)
Creatinine, Ser: 0.69 mg/dL (ref 0.44–1.00)
GFR calc Af Amer: >60 mL/min (ref >60)
Glucose, Bld: 99 mg/dL (ref 65–99)
POTASSIUM: 3 mmol/L — AB (ref 3.5–5.1)
SODIUM: 134 mmol/L — AB (ref 135–145)

## 2015-09-01 LAB — MRSA PCR SCREENING: MRSA BY PCR: POSITIVE — AB

## 2015-09-01 LAB — I-STAT CG4 LACTIC ACID, ED: Lactic Acid, Venous: 2.65 mmol/L (ref 0.5–2.0)

## 2015-09-01 LAB — GLUCOSE, CAPILLARY: Glucose-Capillary: 82 mg/dL (ref 65–99)

## 2015-09-01 LAB — URINE MICROSCOPIC-ADD ON

## 2015-09-01 LAB — LACTIC ACID, PLASMA: Lactic Acid, Venous: 1.7 mmol/L (ref 0.5–2.0)

## 2015-09-01 LAB — CK: Total CK: 28 U/L — ABNORMAL LOW (ref 38–234)

## 2015-09-01 LAB — TROPONIN I

## 2015-09-01 LAB — PROCALCITONIN: Procalcitonin: 7.28 ng/mL

## 2015-09-01 MED ORDER — CHLORHEXIDINE GLUCONATE 0.12 % MT SOLN
15.0000 mL | Freq: Two times a day (BID) | OROMUCOSAL | Status: DC
  Administered 2015-09-01 – 2015-09-02 (×3): 15 mL via OROMUCOSAL
  Filled 2015-09-01: qty 15

## 2015-09-01 MED ORDER — CHLORHEXIDINE GLUCONATE CLOTH 2 % EX PADS
6.0000 | MEDICATED_PAD | Freq: Every day | CUTANEOUS | Status: AC
  Administered 2015-09-01 – 2015-09-05 (×5): 6 via TOPICAL

## 2015-09-01 MED ORDER — DULOXETINE HCL 60 MG PO CPEP
60.0000 mg | ORAL_CAPSULE | Freq: Every day | ORAL | Status: DC
  Administered 2015-09-01 – 2015-09-10 (×10): 60 mg via ORAL
  Filled 2015-09-01 (×10): qty 1

## 2015-09-01 MED ORDER — MUPIROCIN 2 % EX OINT
1.0000 "application " | TOPICAL_OINTMENT | Freq: Two times a day (BID) | CUTANEOUS | Status: AC
  Administered 2015-09-01 – 2015-09-05 (×9): 1 via NASAL
  Filled 2015-09-01 (×2): qty 22

## 2015-09-01 MED ORDER — CETYLPYRIDINIUM CHLORIDE 0.05 % MT LIQD
7.0000 mL | Freq: Two times a day (BID) | OROMUCOSAL | Status: DC
  Administered 2015-09-01 – 2015-09-02 (×3): 7 mL via OROMUCOSAL

## 2015-09-01 MED ORDER — VANCOMYCIN HCL IN DEXTROSE 1-5 GM/200ML-% IV SOLN
1000.0000 mg | Freq: Two times a day (BID) | INTRAVENOUS | Status: DC
  Administered 2015-09-01 – 2015-09-03 (×4): 1000 mg via INTRAVENOUS
  Filled 2015-09-01 (×6): qty 200

## 2015-09-01 MED ORDER — ATORVASTATIN CALCIUM 20 MG PO TABS
20.0000 mg | ORAL_TABLET | Freq: Every day | ORAL | Status: DC
Start: 2015-09-01 — End: 2015-09-10
  Administered 2015-09-01 – 2015-09-09 (×8): 20 mg via ORAL
  Filled 2015-09-01 (×8): qty 1

## 2015-09-01 MED ORDER — SODIUM CHLORIDE 0.9 % IV BOLUS (SEPSIS)
500.0000 mL | Freq: Once | INTRAVENOUS | Status: AC
  Administered 2015-09-01: 500 mL via INTRAVENOUS

## 2015-09-01 MED ORDER — LEVOFLOXACIN IN D5W 750 MG/150ML IV SOLN
750.0000 mg | Freq: Once | INTRAVENOUS | Status: AC
  Administered 2015-09-01: 750 mg via INTRAVENOUS
  Filled 2015-09-01: qty 150

## 2015-09-01 MED ORDER — IPRATROPIUM-ALBUTEROL 0.5-2.5 (3) MG/3ML IN SOLN
3.0000 mL | Freq: Once | RESPIRATORY_TRACT | Status: AC
  Administered 2015-09-01: 3 mL via RESPIRATORY_TRACT
  Filled 2015-09-01: qty 3

## 2015-09-01 MED ORDER — PANTOPRAZOLE SODIUM 40 MG PO TBEC
40.0000 mg | DELAYED_RELEASE_TABLET | Freq: Every day | ORAL | Status: DC
  Administered 2015-09-01 – 2015-09-10 (×10): 40 mg via ORAL
  Filled 2015-09-01 (×10): qty 1

## 2015-09-01 MED ORDER — SODIUM CHLORIDE 0.9 % IV BOLUS (SEPSIS)
1000.0000 mL | Freq: Once | INTRAVENOUS | Status: AC
  Administered 2015-09-01: 1000 mL via INTRAVENOUS

## 2015-09-01 MED ORDER — BUPROPION HCL ER (XL) 150 MG PO TB24
150.0000 mg | ORAL_TABLET | Freq: Every day | ORAL | Status: DC
Start: 2015-09-01 — End: 2015-09-10
  Administered 2015-09-01 – 2015-09-10 (×10): 150 mg via ORAL
  Filled 2015-09-01 (×10): qty 1

## 2015-09-01 MED ORDER — VANCOMYCIN HCL IN DEXTROSE 1-5 GM/200ML-% IV SOLN
1000.0000 mg | Freq: Once | INTRAVENOUS | Status: AC
  Administered 2015-09-01: 1000 mg via INTRAVENOUS
  Filled 2015-09-01: qty 200

## 2015-09-01 MED ORDER — OLOPATADINE HCL 0.1 % OP SOLN
1.0000 [drp] | Freq: Two times a day (BID) | OPHTHALMIC | Status: DC
  Administered 2015-09-01 – 2015-09-10 (×17): 1 [drp] via OPHTHALMIC
  Filled 2015-09-01 (×4): qty 5

## 2015-09-01 MED ORDER — ACETAMINOPHEN 325 MG PO TABS
650.0000 mg | ORAL_TABLET | Freq: Four times a day (QID) | ORAL | Status: DC | PRN
  Administered 2015-09-01 – 2015-09-09 (×9): 650 mg via ORAL
  Filled 2015-09-01 (×11): qty 2

## 2015-09-01 MED ORDER — POTASSIUM CHLORIDE CRYS ER 20 MEQ PO TBCR
40.0000 meq | EXTENDED_RELEASE_TABLET | Freq: Once | ORAL | Status: AC
  Administered 2015-09-01: 40 meq via ORAL
  Filled 2015-09-01: qty 2

## 2015-09-01 MED ORDER — SODIUM CHLORIDE 0.9 % IV BOLUS (SEPSIS)
1000.0000 mL | INTRAVENOUS | Status: AC
  Administered 2015-09-01 (×2): 1000 mL via INTRAVENOUS

## 2015-09-01 MED ORDER — LEVOFLOXACIN IN D5W 750 MG/150ML IV SOLN
750.0000 mg | INTRAVENOUS | Status: DC
  Administered 2015-09-02 – 2015-09-03 (×2): 750 mg via INTRAVENOUS
  Filled 2015-09-01 (×3): qty 150

## 2015-09-01 MED ORDER — ALBUTEROL SULFATE (2.5 MG/3ML) 0.083% IN NEBU
2.5000 mg | INHALATION_SOLUTION | Freq: Once | RESPIRATORY_TRACT | Status: AC
  Administered 2015-09-01: 2.5 mg via RESPIRATORY_TRACT
  Filled 2015-09-01: qty 3

## 2015-09-01 NOTE — ED Notes (Signed)
Pt in by [facility], states she has been sob for a couple of weeks, given one neb treatment with some improvement.  Pt also c/o dark urine

## 2015-09-01 NOTE — Progress Notes (Signed)
PT received from [facility] and placed on 6L N/C at this time and tolerating well. Bipap on standby. RT will continue to monitor.

## 2015-09-01 NOTE — Progress Notes (Signed)
ANTIBIOTIC CONSULT NOTE - INITIAL  Pharmacy Consult for vancomycin and levofloxacin Indication: pneumonia  Allergies  Allergen Reactions  . Gabapentin Shortness Of Breath and Rash  . Mobic [Meloxicam] Anaphylaxis  . Penicillins Shortness Of Breath and Swelling  . Sulfa Antibiotics Shortness Of Breath and Swelling    Patient Measurements: Height: 5\' 4"  (162.6 cm) Weight: 190 lb 0.6 oz (86.2 kg) IBW/kg (Calculated) : 54.7 Adjusted Body Weight: 67.3 kg  Vital Signs: Temp: 97.7 F (36.5 C) (01/02 1203) Temp Source: Oral (01/02 1203) BP: 97/62 mmHg (01/02 1300) Pulse Rate: 112 (01/02 1300) Intake/Output from previous day: 01/01 0701 - 01/02 0700 In: 20 [I.V.:10; IV Piggyback:10] Out: 100 [Urine:100] Intake/Output from this shift: Total I/O In: 1530 [I.V.:1530] Out: -   Labs:  Recent Labs  09/01/15 0319  WBC 17.3*  HGB 14.9  PLT 139*  CREATININE 0.69   Estimated Creatinine Clearance: 81.4 mL/min (by C-G formula based on Cr of 0.69). No results for input(s): VANCOTROUGH, VANCOPEAK, VANCORANDOM, GENTTROUGH, GENTPEAK, GENTRANDOM, TOBRATROUGH, TOBRAPEAK, TOBRARND, AMIKACINPEAK, AMIKACINTROU, AMIKACIN in the last 72 hours.    Medical History: Past Medical History  Diagnosis Date  . Fibromyalgia   . Asthma   . DJD (degenerative joint disease)   . Chronic pain   . Depression   . Anxiety   . Panic attack   . Sleep disorder   . DJD (degenerative joint disease)   . GERD (gastroesophageal reflux disease)   . Hyperlipidemia   . COPD (chronic obstructive pulmonary disease) (HCC)   . On home oxygen therapy     Medications:  Scheduled:  . antiseptic oral rinse  7 mL Mouth Rinse q12n4p  . atorvastatin  20 mg Oral q1800  . buPROPion  150 mg Oral Daily  . chlorhexidine  15 mL Mouth Rinse BID  . Chlorhexidine Gluconate Cloth  6 each Topical Q0600  . DULoxetine  60 mg Oral Daily  . mupirocin ointment  1 application Nasal BID  . olopatadine  1 drop Both Eyes BID  .  pantoprazole  40 mg Oral Daily  . vancomycin  1,000 mg Intravenous Q12H   Assessment: 60 yo female with a PMH of O2 dependent COPD [facility] with a 2-week hx of SOB. CXR demonstrated opacification of the left hemithorax, suspect pneumonia with component of effusion. Lactic acid on admission 2.65, now WNL. WBC 17.3, Scr 0.9 (CrCl 81 ml/min), afebrile last 24 hours.   Pharmacy consulted for vancomycin and levofloxacin.   Goal of Therapy:  Vancomycin trough level 15-20 mcg/ml  Plan:  Vancomycin 1000mg  IV q12 hours Levofloxacin 750 mg IV q24 hrous Check vanc trough at steady state (before 4th dose) Monitor renal function, cultures   Earl GalaJenna Wood, PharmD Candidate 09/01/2015,1:44 PM   I agree with the assessment and plan.   Agapito GamesAlison Amaya Blakeman, PharmD, BCPS Clinical Pharmacist Pager: [phone] 09/01/2015 2:32 PM

## 2015-09-01 NOTE — ED Notes (Signed)
[facility]

## 2015-09-01 NOTE — Progress Notes (Signed)
[facility] Physician-Brief Progress Note Patient Name: Basilio Cairoenny E Brinegar DOB: 10/11/1955 MRN: [id number]   Date of Service  09/01/2015  HPI/Events of Note  RN notified of GPC in blood cultures from OSH.  eICU Interventions  Currently on Vancomycin.     Intervention Category Intermediate Interventions: Other:  Lawanda CousinsJennings Yardley Lekas 09/01/2015, 5:00 PM

## 2015-09-01 NOTE — ED Provider Notes (Signed)
CSN: [id number]     Arrival date & time 09/01/15  4098 History   First MD Initiated Contact with Patient 09/01/15 0242     Chief Complaint  Patient presents with  . Shortness of Breath   LEVEL 5 CAVEAT DUE TO ACUITY OF CONDITION   Patient is a 60 y.o. female presenting with shortness of breath. The history is provided by the patient.  Shortness of Breath Severity:  Severe Onset quality:  Gradual Duration:  2 weeks Timing:  Constant Progression:  Worsening Chronicity:  New Relieved by:  Nothing Worsened by:  Nothing tried Associated symptoms: cough and wheezing   Patient presents for cough/SOB/wheezing for past 2 weeks It worsened tonight and she called EMS She is not on home oxygen She was given one albuterol treatment en route She reports h/o allergy to steroids previously   rest of history deferred as patient is very tachypneic Past Medical History  Diagnosis Date  . Fibromyalgia   . Asthma   . DJD (degenerative joint disease)   . Chronic pain   . Depression   . Anxiety   . Panic attack   . Sleep disorder   . DJD (degenerative joint disease)   . GERD (gastroesophageal reflux disease)   . Hyperlipidemia   . COPD (chronic obstructive pulmonary disease) (HCC)   . On home oxygen therapy    Past Surgical History  Procedure Laterality Date  . Abdominal surgery    . Carpal tunnel release    . Ankle surgery    . Knee surgery    . Cesarean section    . Tonsillectomy    . Tubal ligation    . Back surgery     Family History  Problem Relation Age of Onset  . Cancer Mother     beast   Social History  Substance Use Topics  . Smoking status: Current Every Day Smoker -- 1.00 packs/day for 8 years    Types: Cigarettes    Last Attempt to Quit: 09/26/2013  . Smokeless tobacco: Never Used  . Alcohol Use: No     Comment: occ   OB History    Gravida Para Term Preterm AB TAB SAB Ectopic Multiple Living   3 2 2  1  1   2      Review of Systems  Unable to perform ROS:  Acuity of condition  Respiratory: Positive for cough, shortness of breath and wheezing.       Allergies  Mobic; Penicillins; and Sulfa antibiotics  Home Medications   Prior to Admission medications   Medication Sig Start Date End Date Taking? Authorizing Provider  albuterol (PROVENTIL) (2.5 MG/3ML) 0.083% nebulizer solution TAKE 3 ML BY NEBULIZATION Q 6 hrs prn FOR WHEEZING OR SOB Dx J44.9 10/25/14  Yes Mary-Margaret Daphine Deutscher, FNP  atorvastatin (LIPITOR) 20 MG tablet TAKE 1 TABLET (20 MG TOTAL) BY MOUTH DAILY. 08/01/15  Yes Mary-Margaret Daphine Deutscher, FNP  benzonatate (TESSALON) 100 MG capsule Take 1 capsule (100 mg total) by mouth 3 (three) times daily as needed for cough. 08/05/15  Yes Mary-Margaret Daphine Deutscher, FNP  buPROPion (WELLBUTRIN XL) 150 MG 24 hr tablet TAKE 1 TABLET (150 MG TOTAL) BY MOUTH DAILY. 08/01/15  Yes Mary-Margaret Daphine Deutscher, FNP  cloNIDine (CATAPRES) 0.1 MG tablet Take 0.1 mg by mouth 2 (two) times daily.   Yes Historical Provider, MD  diclofenac (VOLTAREN) 75 MG EC tablet Take 1 tablet (75 mg total) by mouth 2 (two) times daily. 05/02/15  Yes Mary-Margaret Daphine Deutscher, FNP  diclofenac  sodium (VOLTAREN) 1 % GEL Apply 2 g topically 4 (four) times daily. 05/06/15  Yes Mary-Margaret Daphine Deutscher, FNP  DULoxetine (CYMBALTA) 60 MG capsule TAKE 1 CAPSULE (60 MG TOTAL) BY MOUTH DAILY. 08/01/15  Yes Mary-Margaret Daphine Deutscher, FNP  fluticasone (FLONASE) 50 MCG/ACT nasal spray Place 2 sprays into both nostrils daily. 11/19/14  Yes Mary-Margaret Daphine Deutscher, FNP  Fluticasone Furoate-Vilanterol 100-25 MCG/INH AEPB Inhale 1 puff into the lungs daily. 07/08/14  Yes Ernestina Penna, MD  ibuprofen (ADVIL,MOTRIN) 800 MG tablet TAKE 1 TABLET (800 MG TOTAL) BY MOUTH EVERY 6 (SIX) HOURS AS NEEDED FOR PAIN. 12/03/14  Yes Mary-Margaret Daphine Deutscher, FNP  levalbuterol [facility] HFA) 45 MCG/ACT inhaler Inhale 2 puffs into the lungs every 8 (eight) hours as needed for wheezing or shortness of breath. 07/28/14  Yes Hennie Pia, MD  omeprazole  (PRILOSEC) 20 MG capsule TAKE 1 CAPSULE (20 MG TOTAL) BY MOUTH DAILY. 07/28/15  Yes Mary-Margaret Daphine Deutscher, FNP  PATANOL 0.1 % ophthalmic solution PLACE 1 DROP INTO BOTH EYES 2 (TWO) TIMES DAILY. 07/03/14  Yes Mary-Margaret Daphine Deutscher, FNP  promethazine (PHENERGAN) 12.5 MG tablet Take 12.5 mg by mouth every 6 (six) hours as needed for nausea or vomiting.   Yes Historical Provider, MD  tiotropium (SPIRIVA) 18 MCG inhalation capsule Place 1 capsule (18 mcg total) into inhaler and inhale daily. 08/01/15  Yes Mary-Margaret Daphine Deutscher, FNP  VENTOLIN HFA 108 (90 BASE) MCG/ACT inhaler INHALE 2 PUFFS INTO THE LUNGS DAILY AS NEEDED FOR WHEEZING OR SHORTNESS OF BREATH. 07/04/15  Yes Mary-Margaret Daphine Deutscher, FNP  XOPENEX HFA 45 MCG/ACT inhaler USE 1 TO 2 PUFFS EVERY 4 HOURS AS NEEDED FOR SHORTNESS OF BREATH 07/04/15  Yes Mary-Margaret Daphine Deutscher, FNP  cefdinir (OMNICEF) 300 MG capsule Take 1 capsule (300 mg total) by mouth 2 (two) times daily. 1 po BID 08/01/15   Mary-Margaret Daphine Deutscher, FNP  cyclobenzaprine (FLEXERIL) 10 MG tablet TAKE 1 TABLET (10 MG TOTAL) BY MOUTH 3 (THREE) TIMES DAILY AS NEEDED FOR MUSCLE SPASMS. 03/27/15   Mary-Margaret Daphine Deutscher, FNP  cyclobenzaprine (FLEXERIL) 10 MG tablet TAKE 1 TABLET (10 MG TOTAL) BY MOUTH 3 (THREE) TIMES DAILY AS NEEDED FOR MUSCLE SPASMS. 08/19/15   Mary-Margaret Daphine Deutscher, FNP  nystatin (MYCOSTATIN) 100000 UNIT/ML suspension Take 5 mLs (500,000 Units total) by mouth 4 (four) times daily. 08/01/15   Mary-Margaret Daphine Deutscher, FNP  ondansetron (ZOFRAN) 4 MG tablet TAKE 1 TABLET (4 MG TOTAL) BY MOUTH EVERY 8 (EIGHT) HOURS AS NEEDED FOR NAUSEA OR VOMITING. 05/06/15   Mary-Margaret Daphine Deutscher, FNP   BP 100/48 mmHg  Pulse 117  Temp(Src) 98.3 F (36.8 C) (Oral)  Resp 28  Ht 5\' 5"  (1.651 m)  Wt 87.091 kg  BMI 31.95 kg/m2  SpO2 93% Physical Exam CONSTITUTIONAL: Elderly, ill appearing HEAD: Normocephalic/atraumatic EYES: EOMI ENMT: Mucous membranes dry NECK: supple no meningeal signs CV: distant heart  sounds, no murmurs noted, tachycardia noted LUNGS: wheezing/tachypnea noted, she is able to speak in short sentences only ABDOMEN: soft, nontender  NEURO: Pt is awake/alert, moves all extremitiesx4. She appears mildly confused EXTREMITIES: pulses normal/equal,  SKIN: skin is cool to touch PSYCH: anxious   ED Course  Procedures  CRITICAL CARE Performed by: Joya Gaskins Total critical care time: 50 minutes Critical care time was exclusive of separately billable procedures and treating other patients. Critical care was necessary to treat or prevent imminent or life-threatening deterioration. Critical care was time spent personally by me on the following activities: development of treatment plan with patient and/or surrogate as well as nursing, discussions  with consultants, evaluation of patient's response to treatment, examination of patient, obtaining history from patient or surrogate, ordering and performing treatments and interventions, ordering and review of laboratory studies, ordering and review of radiographic studies, pulse oximetry and re-evaluation of patient's condition. PATIENT WITH RESPIRATORY FAILURE REQUIRING BIPAP SHE IS ALSO SEPTIC REQUIRING IV FLUIDS AND IV ANTIBIOTICS AND TRANSFER TO ICU 2:54 AM Pt with SOB for 2 weeks She is ill appearing Nebulizer treatments ordered She reports allergy to steroids, will avoid those for now She may require BiPAP Will follow closely 3:08 AM Pt with significantly abnormal CXR Will call code sepsis Will consult critical care 3:19 AM Will place on BiPAP to help with work of breathing Pt is awake/alert at this time 3:30 AM D/w dr summer with critical care He reviewed xray We discussed case [facility] and otherwise stable, she can be managed at [facility].  If she worsens or can not receive timely thoracentesis, she should be transferred D/w radiology and pt unable to have thoracentesis  due to holiday 3:53 AM Call placed to critical care in [place] Pt is awake/alert, she appears improved with BIPAP She is mildly hypotensive and IV fluids are infusing 4:00 AM Pt accepted to [place] ICU, d/w dr summer 5:05 AM Pt awake/alert Her work of breathing is improved Current SBP >90 MAP >65 I feel she is appropriate for transfer at this time  Labs Review Labs Reviewed  BASIC METABOLIC PANEL - Abnormal; Notable for the following:    Sodium 134 (*)    Potassium 3.0 (*)    Chloride 96 (*)    Calcium 8.3 (*)    All other components within normal limits  CBC WITH DIFFERENTIAL/PLATELET - Abnormal; Notable for the following:    WBC 17.3 (*)    Platelets 139 (*)    Neutro Abs 15.8 (*)    All other components within normal limits  CK - Abnormal; Notable for the following:    Total CK 28 (*)    All other components within normal limits  BLOOD GAS, ARTERIAL - Abnormal; Notable for the following:    pO2, Arterial 64.2 (*)    All other components within normal limits  URINALYSIS, ROUTINE W REFLEX MICROSCOPIC (NOT AT [facility]) - Abnormal; Notable for the following:    Glucose, UA 100 (*)    Bilirubin Urine MODERATE (*)    Protein, ur 100 (*)    Leukocytes, UA TRACE (*)    All other components within normal limits  URINE MICROSCOPIC-ADD ON - Abnormal; Notable for the following:    Squamous Epithelial / LPF 0-5 (*)    Bacteria, UA FEW (*)    All other components within normal limits  I-STAT CG4 LACTIC ACID, ED - Abnormal; Notable for the following:    Lactic Acid, Venous 2.65 (*)    All other components within normal limits  CULTURE, BLOOD (ROUTINE X 2)  CULTURE, BLOOD (ROUTINE X 2)  URINE CULTURE  TROPONIN I    Imaging Review Dg Chest Portable 1 View  09/01/2015  CLINICAL DATA:  Acute onset of shortness of breath. Initial encounter. EXAM: PORTABLE CHEST 1 VIEW COMPARISON:  Chest radiograph from 10/17/2014 FINDINGS: There is complete opacification of the left hemithorax,  thought to reflect a combination of pleural effusion and airspace opacification. Mild vascular congestion is noted. The right lung remains grossly clear. No pneumothorax is seen. The cardiomediastinal silhouette is borderline normal in size. No acute osseous  abnormalities are seen. IMPRESSION: Complete opacification of the left hemithorax, thought to reflect a combination of pleural effusion and airspace opacification. Mild vascular congestion noted. This is concerning for pneumonia, though asymmetric interstitial edema might have a similar appearance. Electronically Signed   By: Roanna Raider M.D.   On: 09/01/2015 03:03   I have personally reviewed and evaluated these images and lab results as part of my medical decision-making.   EKG Interpretation   Date/Time:  Monday September 01 2015 02:43:41 EST Ventricular Rate:  116 PR Interval:  122 QRS Duration: 142 QT Interval:  343 QTC Calculation: 476 R Axis:   60 Text Interpretation:  Sinus tachycardia Right bundle branch block Abnormal  ekg No significant change since last tracing Confirmed by Bebe Shaggy  MD,  Rayna Brenner ([id number]) on 09/01/2015 2:50:42 AM     Medications  sodium chloride 0.9 % bolus 1,000 mL (1,000 mLs Intravenous New Bag/Given 09/01/15 0449)  ipratropium-albuterol (DUONEB) 0.5-2.5 (3) MG/3ML nebulizer solution 3 mL (3 mLs Nebulization Given 09/01/15 0253)  albuterol (PROVENTIL) (2.5 MG/3ML) 0.083% nebulizer solution 2.5 mg (2.5 mg Nebulization Given 09/01/15 0253)  levofloxacin (LEVAQUIN) IVPB 750 mg (750 mg Intravenous New Bag/Given 09/01/15 0324)  vancomycin (VANCOCIN) IVPB 1000 mg/200 mL premix (0 mg Intravenous Stopped 09/01/15 0500)     MDM   Final diagnoses:  Acute respiratory failure with hypoxia (HCC)  Chronic obstructive pulmonary disease with acute lower respiratory infection (HCC)  CAP (community acquired pneumonia)  Pleural effusion  Sepsis, due to unspecified organism [facility])    Nursing notes including past medical history  and social history reviewed and considered in documentation xrays/imaging reviewed by myself and considered during evaluation Labs/vital reviewed myself and considered during evaluation     Zadie Rhine, MD 09/01/15 [phone]

## 2015-09-01 NOTE — H&P (Signed)
PULMONARY / CRITICAL CARE MEDICINE   Name: Bethany Villa MRN: [id number] DOB: 01/20/1956    ADMISSION DATE:  09/01/2015 CONSULTATION DATE:  09/01/15  REFERRING MD:  Dr. Bebe Shaggy / [facility]   CHIEF COMPLAINT:  SOB   HISTORY OF PRESENT ILLNESS:   60 y/o F with PMH of DJD, GERD, HLD, fibromyalgia, chronic pain (followed at a pain clinic), anxiety / depression, and O2 dependent COPD who presented to [facility] on 09/01/15 with complaints of SOB.   The patient reported a two week history of shortness of breath, non-productive cough, and wheezing.  She attempted home nebulized albuterol without relief of symptoms.  The patient reports she had to move around Thanksgiving into a new home and feels she became sick to to "how cold it stays in the house".  She has noted 30+ lb weight loss over the last month.  Last office visit note prior to 07/2015 was in 09/2014 where her weight was recorded at 215 lbs.  She denies night sweats but endorses decreased appetite.  On presentation, she reported 2 wks of SOB and dark urine.  Initial labs notable for Na 134, K 3.0, Cl 96, CK 28, troponin 0.03, WBC 17.3, Hgb 14.9, and platelets 139.  UA negative.  CXR evaluation demonstrated near opacification of the left hemithorax.  [facility] was unable to evaluate for thoracentesis and she was transferred to [facility] for further evaluation.     PAST MEDICAL HISTORY :  She  has a past medical history of Fibromyalgia; Asthma; DJD (degenerative joint disease); Chronic pain; Depression; Anxiety; Panic attack; Sleep disorder; DJD (degenerative joint disease); GERD (gastroesophageal reflux disease); Hyperlipidemia; COPD (chronic obstructive pulmonary disease) (HCC); and On home oxygen therapy.  PAST SURGICAL HISTORY: She  has past surgical history that includes Abdominal surgery; Carpal tunnel release; Ankle surgery; Knee surgery; Cesarean section; Tonsillectomy; Tubal ligation; and Back surgery.  Allergies  Allergen Reactions  . Mobic [Meloxicam]  Anaphylaxis  . Penicillins Shortness Of Breath and Swelling  . Sulfa Antibiotics Shortness Of Breath and Swelling    No current facility-administered medications on file prior to encounter.   Current Outpatient Prescriptions on File Prior to Encounter  Medication Sig  . albuterol (PROVENTIL) (2.5 MG/3ML) 0.083% nebulizer solution TAKE 3 ML BY NEBULIZATION Q 6 hrs prn FOR WHEEZING OR SOB Dx J44.9  . atorvastatin (LIPITOR) 20 MG tablet TAKE 1 TABLET (20 MG TOTAL) BY MOUTH DAILY.  . benzonatate (TESSALON) 100 MG capsule Take 1 capsule (100 mg total) by mouth 3 (three) times daily as needed for cough.  Marland Kitchen buPROPion (WELLBUTRIN XL) 150 MG 24 hr tablet TAKE 1 TABLET (150 MG TOTAL) BY MOUTH DAILY.  Marland Kitchen diclofenac (VOLTAREN) 75 MG EC tablet Take 1 tablet (75 mg total) by mouth 2 (two) times daily.  . diclofenac sodium (VOLTAREN) 1 % GEL Apply 2 g topically 4 (four) times daily.  . DULoxetine (CYMBALTA) 60 MG capsule TAKE 1 CAPSULE (60 MG TOTAL) BY MOUTH DAILY.  . fluticasone (FLONASE) 50 MCG/ACT nasal spray Place 2 sprays into both nostrils daily.  . Fluticasone Furoate-Vilanterol 100-25 MCG/INH AEPB Inhale 1 puff into the lungs daily.  Marland Kitchen ibuprofen (ADVIL,MOTRIN) 800 MG tablet TAKE 1 TABLET (800 MG TOTAL) BY MOUTH EVERY 6 (SIX) HOURS AS NEEDED FOR PAIN.  Marland Kitchen levalbuterol (XOPENEX HFA) 45 MCG/ACT inhaler Inhale 2 puffs into the lungs every 8 (eight) hours as needed for wheezing or shortness of breath.  Marland Kitchen omeprazole (PRILOSEC) 20 MG capsule TAKE 1 CAPSULE (20 MG TOTAL) BY  MOUTH DAILY.  Marland Kitchen PATANOL 0.1 % ophthalmic solution PLACE 1 DROP INTO BOTH EYES 2 (TWO) TIMES DAILY.  Marland Kitchen tiotropium (SPIRIVA) 18 MCG inhalation capsule Place 1 capsule (18 mcg total) into inhaler and inhale daily.  . VENTOLIN HFA 108 (90 BASE) MCG/ACT inhaler INHALE 2 PUFFS INTO THE LUNGS DAILY AS NEEDED FOR WHEEZING OR SHORTNESS OF BREATH.  Pauline Aus HFA 45 MCG/ACT inhaler USE 1 TO 2 PUFFS EVERY 4 HOURS AS NEEDED FOR SHORTNESS OF BREATH  .  cefdinir (OMNICEF) 300 MG capsule Take 1 capsule (300 mg total) by mouth 2 (two) times daily. 1 po BID  . cyclobenzaprine (FLEXERIL) 10 MG tablet TAKE 1 TABLET (10 MG TOTAL) BY MOUTH 3 (THREE) TIMES DAILY AS NEEDED FOR MUSCLE SPASMS.  . cyclobenzaprine (FLEXERIL) 10 MG tablet TAKE 1 TABLET (10 MG TOTAL) BY MOUTH 3 (THREE) TIMES DAILY AS NEEDED FOR MUSCLE SPASMS.  Marland Kitchen nystatin (MYCOSTATIN) 100000 UNIT/ML suspension Take 5 mLs (500,000 Units total) by mouth 4 (four) times daily.  . ondansetron (ZOFRAN) 4 MG tablet TAKE 1 TABLET (4 MG TOTAL) BY MOUTH EVERY 8 (EIGHT) HOURS AS NEEDED FOR NAUSEA OR VOMITING.    FAMILY HISTORY:  Her has no family status information on file.   SOCIAL HISTORY: She  reports that she has been smoking Cigarettes.  She has a 8 pack-year smoking history. She has never used smokeless tobacco. She reports that she does not drink alcohol or use illicit drugs.  REVIEW OF SYSTEMS:   Gen: Denies fever, chills, fatigue, night sweats.  Reports 30lb weight loss HEENT: Denies blurred vision, double vision, hearing loss, tinnitus, sinus congestion, rhinorrhea, sore throat, neck stiffness, dysphagia PULM: Denies sputum production, hemoptysis.  Reports shortness of breath, dry cough, & wheezing CV: Denies chest pain, edema, orthopnea, paroxysmal nocturnal dyspnea, palpitations GI: Denies abdominal pain, nausea, vomiting, diarrhea, hematochezia, melena, constipation, change in bowel habits GU: Denies dysuria, hematuria, polyuria, oliguria, urethral discharge Endocrine: Denies hot or cold intolerance, polyuria, polyphagia or appetite change Derm: Denies rash, dry skin, scaling or peeling skin change.  Reports redness in L>R lower extremities Heme: Denies easy bruising, bleeding, bleeding gums Neuro: Denies headache, numbness, weakness, slurred speech, loss of memory or consciousness    SUBJECTIVE: Pt currently reports headache and low back pain.    VITAL SIGNS: BP 91/78 mmHg  Pulse  109  Temp(Src) 97.9 F (36.6 C) (Oral)  Resp 22  Ht 5\' 4"  (1.626 m)  Wt 190 lb 0.6 oz (86.2 kg)  BMI 32.60 kg/m2  SpO2 96%  HEMODYNAMICS:    VENTILATOR SETTINGS:    INTAKE / OUTPUT: I/O last 3 completed shifts: In: 20 [I.V.:10; IV Piggyback:10] Out: 100 [Urine:100]  PHYSICAL EXAMINATION: General:  Obese female in NAD Neuro:  AAOx4, intermittent confusion but easily reoriented, MAE3 HEENT:  MM pink/dry, no jvd, good dentition  Cardiovascular:  s1s2 distant tones, ST on monitor  Lungs:  Tachypnea in 20's, clear on R, left with bronchial breath sounds throughout, crackles  Abdomen:  Obese/soft, bsx4 active  Musculoskeletal:  No acute deformities  Skin:  Warm/dry, L foot with fine lacy erythema, blanches, 1+ BLE pitting edema  Left Ultrasound Pleural Assessment:         LABS:  BMET  Recent Labs Lab 09/01/15 0319  NA 134*  K 3.0*  CL 96*  CO2 25  BUN 19  CREATININE 0.69  GLUCOSE 99    Electrolytes  Recent Labs Lab 09/01/15 0319  CALCIUM 8.3*    CBC  Recent  Labs Lab 09/01/15 0319  WBC 17.3*  HGB 14.9  HCT 44.0  PLT 139*    Coag's No results for input(s): APTT, INR in the last 168 hours.  Sepsis Markers  Recent Labs Lab 09/01/15 0335  LATICACIDVEN 2.65*    ABG  Recent Labs Lab 09/01/15 0325  PHART 7.361  PCO2ART 44.8  PO2ART 64.2*    Liver Enzymes No results for input(s): AST, ALT, ALKPHOS, BILITOT, ALBUMIN in the last 168 hours.  Cardiac Enzymes  Recent Labs Lab 09/01/15 0319  TROPONINI <0.03    Glucose No results for input(s): GLUCAP in the last 168 hours.  Imaging Dg Chest Portable 1 View  09/01/2015  CLINICAL DATA:  Acute onset of shortness of breath. Initial encounter. EXAM: PORTABLE CHEST 1 VIEW COMPARISON:  Chest radiograph from 10/17/2014 FINDINGS: There is complete opacification of the left hemithorax, thought to reflect a combination of pleural effusion and airspace opacification. Mild vascular congestion  is noted. The right lung remains grossly clear. No pneumothorax is seen. The cardiomediastinal silhouette is borderline normal in size. No acute osseous abnormalities are seen. IMPRESSION: Complete opacification of the left hemithorax, thought to reflect a combination of pleural effusion and airspace opacification. Mild vascular congestion noted. This is concerning for pneumonia, though asymmetric interstitial edema might have a similar appearance. Electronically Signed   By: Roanna Raider M.D.   On: 09/01/2015 03:03     STUDIES:  1/02  L Pleural Assessment >> small amount pleural fluid, first image would consider diagnostic thora only, second more lateral appears to have larger pocket, see images above  CULTURES: UA 1/2 >> negative  ANTIBIOTICS:  PCN/Sulfa Allergy Vanco 1/2 >>  Levaquin 1/2 >>   SIGNIFICANT EVENTS: 1/02  Admit from [facility] with SOB, weight loss, opacification of L hemithorax   LINES/TUBES:   DISCUSSION: 60 y/o smoker with a PMH of O2 dependent COPD, anxiety/depression/fibromyalgia admitted 1/2 with a 2 wk hx of SOB, found to have opacification of the left hemithorax.  Tx to [facility] for evaluation.  Lactic acid cleared with volume resuscitation.    ASSESSMENT / PLAN:  PULMONARY A: Left Hemithorax Opacification - suspect PNA + component of effusion.  DDx includes malignancy with weight loss and empyema due to prolonged duration of infectious symptoms (2 wks +) Acute Hypoxemic Respiratory Failure  CAP  Left Pleural Effusion  O2 Dependent COPD  Tobacco Abuse P:   Oxygen to maintain saturations 88-95% Push pulmonary hygiene - vibra vest, flutter valve  Follow up CXR in am 1/3  See ID  Duoneb Q6 with PRN albuterol  CT chest to evaluate obstruction vs effusion  Hold home Futicasone-Vilanterol  May need thoracentesis pending CT evaluation   CARDIOVASCULAR A:  Tachycardia  Hypotension - resolving w 3.5L resuscitation, troponin negative Sepsis - elevated lactic acid on  admit + suspected PNA as source Known RBBB  P:  Monitor in ICU overnight with soft BP Minimize sedating medications as able  No lasix for now MAP goal > 65  Continue lipitor   RENAL A:   Hypokalemia  P:   Monitor BMP / UOP  Replace electrolytes as indicated, KCL 40 mEq now  GASTROINTESTINAL A:   GERD hx P:   Heart healthy diet as tolerated Protonix in place of home prilosec    HEMATOLOGIC A:   Leukocytosis  P:  Monitor CBC  Heparin SQ for DVT prophylaxis   INFECTIOUS A:   Sepsis - suspected PNA source, afebrile.  Lactic acid cleared 1/2.  P:   ABX as above  Follow cultures as above Assess CT chest to evaluate parenchyma & pleural space   ENDOCRINE A:   No acute issues  Hx of allergy to prednisone - denies recent prednisone usage   P:   Monitor glucose on BMP   NEUROLOGIC A:   Chronic Pain - followed at pain clinic P:   RASS goal: 0 Continue Wellbutrin, Cymbalta Hold Flexeril, home dilaudid (not listed on home med rec but patient reports taking from pain clinic)   FAMILY  - Updates: Patient & sister updated on plan of care.  May be able to transfer out of ICU pm 1/2 if BP remains stable.  - Inter-disciplinary family meet or Palliative Care meeting due by:  09/07/14   Canary BrimBrandi Sharell Hilmer, NP-C [facility] Pgr: [phone] or if no answer [phone] 09/01/2015, 8:44 AM

## 2015-09-01 NOTE — Progress Notes (Signed)
Utilization review completed.  

## 2015-09-02 ENCOUNTER — Inpatient Hospital Stay (HOSPITAL_COMMUNITY): Payer: Medicare Other

## 2015-09-02 LAB — CBC
HCT: 38.6 % (ref 36.0–46.0)
Hemoglobin: 12.2 g/dL (ref 12.0–15.0)
MCH: 28.5 pg (ref 26.0–34.0)
MCHC: 31.6 g/dL (ref 30.0–36.0)
MCV: 90.2 fL (ref 78.0–100.0)
PLATELETS: 138 10*3/uL — AB (ref 150–400)
RBC: 4.28 MIL/uL (ref 3.87–5.11)
RDW: 16.2 % — AB (ref 11.5–15.5)
WBC: 10.7 10*3/uL — AB (ref 4.0–10.5)

## 2015-09-02 LAB — BASIC METABOLIC PANEL
Anion gap: 9 (ref 5–15)
BUN: 14 mg/dL (ref 6–20)
CHLORIDE: 104 mmol/L (ref 101–111)
CO2: 25 mmol/L (ref 22–32)
CREATININE: 0.68 mg/dL (ref 0.44–1.00)
Calcium: 7.8 mg/dL — ABNORMAL LOW (ref 8.9–10.3)
Glucose, Bld: 103 mg/dL — ABNORMAL HIGH (ref 65–99)
Potassium: 3.4 mmol/L — ABNORMAL LOW (ref 3.5–5.1)
SODIUM: 138 mmol/L (ref 135–145)

## 2015-09-02 LAB — GLUCOSE, CAPILLARY: GLUCOSE-CAPILLARY: 87 mg/dL (ref 65–99)

## 2015-09-02 LAB — URINE CULTURE: Culture: NO GROWTH

## 2015-09-02 LAB — PROCALCITONIN: PROCALCITONIN: 6.87 ng/mL

## 2015-09-02 MED ORDER — HEPARIN SODIUM (PORCINE) 5000 UNIT/ML IJ SOLN
5000.0000 [IU] | Freq: Three times a day (TID) | INTRAMUSCULAR | Status: DC
  Administered 2015-09-02 – 2015-09-10 (×24): 5000 [IU] via SUBCUTANEOUS
  Filled 2015-09-02 (×24): qty 1

## 2015-09-02 MED ORDER — POTASSIUM CHLORIDE CRYS ER 20 MEQ PO TBCR
20.0000 meq | EXTENDED_RELEASE_TABLET | ORAL | Status: AC
  Administered 2015-09-02 (×2): 20 meq via ORAL
  Filled 2015-09-02 (×2): qty 1

## 2015-09-02 MED ORDER — CLINDAMYCIN PHOSPHATE 600 MG/50ML IV SOLN
600.0000 mg | Freq: Three times a day (TID) | INTRAVENOUS | Status: DC
  Administered 2015-09-02 – 2015-09-03 (×3): 600 mg via INTRAVENOUS
  Filled 2015-09-02 (×6): qty 50

## 2015-09-02 NOTE — Progress Notes (Signed)
[facility]OC[facility] FOR AM LAB REPLACEMENT ONLY  The patient does apply for the [facility] Electrolyte Replacment Protocol based on the criteria listed below:   1. Is GFR >/= 40 ml/min? Yes.    Patient's GFR today is >60 2. Is urine output >/= 0.5 ml/kg/hr for the last 6 hours? Yes.   Patient's UOP is 0.57 ml/kg/hr 3. Is BUN < 60 mg/dL? Yes.    Patient's BUN today is 14 4. Abnormal electrolyte(s): Potassium 5. Ordered repletion with: Potassium per protocol  [place] Giammona P 09/02/2015 4:22 AM

## 2015-09-02 NOTE — Progress Notes (Signed)
Pt transfer to 6N05 from 4080m. Pt a/ox4.VSS.

## 2015-09-02 NOTE — Progress Notes (Signed)
Attempted to perform CPT with patient, however patient kept stating that she wanted to eat her breakfast.  Explained to patient that we needed to work to help get secretions out however patient wanted to keep eating and drink her coffee.  Will continue to monitor patient.

## 2015-09-02 NOTE — H&P (Addendum)
PULMONARY / CRITICAL CARE MEDICINE   Name: Bethany Villa MRN: [id number] DOB: [birth year]    ADMISSION DATE:  09/01/2015 CONSULTATION DATE:  09/01/15  REFERRING MD:  Dr. Bebe Shaggy / [facility]   CHIEF COMPLAINT:  SOB   HISTORY OF PRESENT ILLNESS:   60 y/o F with PMH of DJD, GERD, HLD, fibromyalgia, chronic pain (followed at a pain clinic), anxiety / depression, and O2 dependent COPD who presented to [facility] on 09/01/15 with complaints of SOB.   The patient reported a two week history of shortness of breath, non-productive cough, and wheezing.  She attempted home nebulized albuterol without relief of symptoms.  The patient reports she had to move around Thanksgiving into a new home and feels she became sick to to "how cold it stays in the house".  She has noted 30+ lb weight loss over the last month.  Last office visit note prior to 07/2015 was in 09/2014 where her weight was recorded at 215 lbs.  She denies night sweats but endorses decreased appetite.  On presentation, she reported 2 wks of SOB and dark urine.  Initial labs notable for Na 134, K 3.0, Cl 96, CK 28, troponin 0.03, WBC 17.3, Hgb 14.9, and platelets 139.  UA negative.  CXR evaluation demonstrated near opacification of the left hemithorax.  [facility] was unable to evaluate for thoracentesis and she was transferred to [facility] for further evaluation.   SUBJECTIVE: on nasal cannula O2, no distress   VITAL SIGNS: BP 109/78 mmHg  Pulse 100  Temp(Src) 98 F (36.7 C) (Oral)  Resp 26  Ht 5\' 4"  (1.626 m)  Wt 87.6 kg (193 lb 2 oz)  BMI 33.13 kg/m2  SpO2 95%  HEMODYNAMICS:    VENTILATOR SETTINGS:    INTAKE / OUTPUT: I/O last 3 completed shifts: In: 1970 [I.V.:1710; IV Piggyback:260] Out: 1200 [Urine:1200]  PHYSICAL EXAMINATION: General:  Obese female in NAD Neuro:  AAOx4 nonfocal examination HEENT:  jvd noted wnl Cardiovascular:  s1s2 distant tones, ST on monitor  Lungs:  ronchi left reduced Abdomen:  Obese/soft, bsx4 active  Musculoskeletal:   No acute deformities  Skin:  Warm/dry, L foot with fine lacy erythema, blanches, 1+ BLE pitting edema  Left Ultrasound Pleural Assessment:         LABS:  BMET  Recent Labs Lab 09/01/15 0319 09/02/15 0225  NA 134* 138  K 3.0* 3.4*  CL 96* 104  CO2 25 25  BUN 19 14  CREATININE 0.69 0.68  GLUCOSE 99 103*    Electrolytes  Recent Labs Lab 09/01/15 0319 09/02/15 0225  CALCIUM 8.3* 7.8*    CBC  Recent Labs Lab 09/01/15 0319 09/02/15 0225  WBC 17.3* 10.7*  HGB 14.9 12.2  HCT 44.0 38.6  PLT 139* 138*    Coag's No results for input(s): APTT, INR in the last 168 hours.  Sepsis Markers  Recent Labs Lab 09/01/15 0335 09/01/15 0826 09/01/15 1510 09/02/15 0225  LATICACIDVEN 2.65* 1.7  --   --   PROCALCITON  --   --  7.28 6.87    ABG  Recent Labs Lab 09/01/15 0325  PHART 7.361  PCO2ART 44.8  PO2ART 64.2*    Liver Enzymes No results for input(s): AST, ALT, ALKPHOS, BILITOT, ALBUMIN in the last 168 hours.  Cardiac Enzymes  Recent Labs Lab 09/01/15 0319  TROPONINI <0.03    Glucose  Recent Labs Lab 09/01/15 0642 09/01/15 0810  GLUCAP 87 82    Imaging Ct Chest Wo Contrast  09/02/2015  CLINICAL DATA:  Pneumonia EXAM: CT CHEST WITHOUT CONTRAST TECHNIQUE: Multidetector CT imaging of the chest was performed following the standard protocol without IV contrast. COMPARISON:  Chest x-ray 09/01/2015 FINDINGS: Extensive consolidation in the left upper lobe with expansion of left upper lobe and multiple air bronchograms and diffuse infiltrate. Multiple small gas pockets are present in the left upper lobe compatible with infection and necrosis. No definite abscess or air-fluid level. No definite mass lesion however mass lesion could be missed given lack of intravenous contrast and extensive consolidation. Minimal left pleural effusion. Mild dependent atelectasis in the lung bases left greater than right. No significant effusion on the right. Small areas  infiltrate in the right upper lobe anteriorly. Heart size within normal limits. No pericardial effusion. Small very carina lymph nodes. No definite mass lesion. No lung nodule identified. Mild thoracic disc degeneration. Negative for fracture or spinal infection. Upper abdomen reveals no acute abnormality. IMPRESSION: Complete consolidation left upper lobe with air bronchograms and low multiple gas pockets in the lung suggesting necrotizing pneumonia. Minimal left pleural effusion. Mild dependent atelectasis in the lung bases left greater than right. Smaller infiltrate right upper lobe anteriorly. No evidence of mass lesion. Note that the patient did not receive intra intravenous contrast which limits evaluation for mass or lung abscess. Electronically Signed   By: Marlan Palauharles  Clark M.D.   On: 09/02/2015 07:32   Dg Chest Port 1 View  09/02/2015  CLINICAL DATA:  Community-acquired pneumonia, current smoker, history of COPD, obesity. EXAM: PORTABLE CHEST 1 VIEW COMPARISON:  CT scan of the chest and chest x-ray dated September 01, 2015. FINDINGS: Slight improvement in aeration of the left lung is noted. A large amount of alveolar opacity persists throughout the left lung. There is no significant mediastinal shift. The right lung is well-expanded and clear. The right heart border is normal in appearance but the left heart border is obscured. The trachea is midline. The bony thorax is unremarkable. There is gaseous distention of the stomach or bowel under the hemidiaphragm. IMPRESSION: Findings consistent with widespread pneumonia on the left or less likely a central obstructing process. On the right the lung is clear. There is no pleural effusion or pneumothorax on the right. Gaseous distention of the stomach or bowel under in the upper abdomen. Electronically Signed   By: David  SwazilandJordan M.D.   On: 09/02/2015 07:24     STUDIES:  1/02  L Pleural Assessment >> small amount pleural fluid, first image would consider  diagnostic thora only, second more lateral appears to have larger pocket, see images above 1/3 ct chest>>>dense necrotizing PNA CULTURES: UA 1/2 >> negative  ANTIBIOTICS:  PCN/Sulfa Allergy Vanco 1/2 >>  Levaquin 1/2 >>> clinda 1/3>>>  SIGNIFICANT EVENTS: 1/02  Admit from [facility] with SOB, weight loss, opacification of L hemithorax   LINES/TUBES:   DISCUSSION: 60 y/o smoker with a PMH of O2 dependent COPD, anxiety/depression/fibromyalgia admitted 1/2 with a 2 wk hx of SOB, found to have opacification of the left hemithorax.  [facility]  Lactic acid cleared with volume resuscitation.    ASSESSMENT / PLAN:  PULMONARY A: Acute Hypoxemic Respiratory Failure  CAP - necrotizing PNA Left Pleural Effusion  - small O2 Dependent COPD  Tobacco Abuse P:   Oxygen to maintain saturations 88-95% Push pulmonary hygiene - vibra vest, flutter valve  pcxr in am  See ID  Duoneb Q6 with PRN albuterol  Hold home Futicasone-Vilanterol  No indication thora See ID  CARDIOVASCULAR  A:  Sepsis - improved Known RBBB  P:  MAP goal > 60 Continue lipitor   RENAL A:   Hypokalemia  P:   Monitor BMP / UOP  ksupp Pos balance ok  GASTROINTESTINAL A:   GERD hx P:   Heart healthy diet as tolerated Protonix in place of home prilosec    HEMATOLOGIC A:   Leukocytosis  P:  Monitor CBC  Heparin SQ for DVT prophylaxis until ambualtion  INFECTIOUS A:   Sepsis, necrotizing PNA, likely strep, bacteremic P:   clinda addition for anaerobic Continued vanc, levofloxacin If no mrsa in am , dc vanc If strep ver or enteroccus then echo If strep pna, no echo Repeat bc in am  ENDOCRINE A:   No acute issues  Hx of allergy to prednisone - denies recent prednisone usage   P:   Monitor glucose on BMP   NEUROLOGIC A:   Chronic Pain - followed at pain clinic Slight septic enceph P:   RASS goal: 0 Continue Wellbutrin, Cymbalta Hold Flexeril, home dilaudid (not listed on home  med rec but patient reports taking from pain clinic)   FAMILY  - Updates: Patient & sister updated on plan of care.  May be able to transfer out of ICU pm 1/2 if BP remains stable.  - Inter-disciplinary family meet or Palliative Care meeting due by:  09/07/14  To floor, pulse ox, triad  Mcarthur Rossetti. Tyson Alias, MD, FACP Pgr: [phone] [facility]

## 2015-09-03 ENCOUNTER — Inpatient Hospital Stay (HOSPITAL_COMMUNITY): Payer: Medicare Other

## 2015-09-03 ENCOUNTER — Encounter (HOSPITAL_COMMUNITY): Payer: Self-pay | Admitting: General Practice

## 2015-09-03 DIAGNOSIS — J9600 Acute respiratory failure, unspecified whether with hypoxia or hypercapnia: Secondary | ICD-10-CM | POA: Diagnosis present

## 2015-09-03 DIAGNOSIS — A403 Sepsis due to Streptococcus pneumoniae: Principal | ICD-10-CM

## 2015-09-03 DIAGNOSIS — A419 Sepsis, unspecified organism: Secondary | ICD-10-CM | POA: Diagnosis present

## 2015-09-03 LAB — CBC WITH DIFFERENTIAL/PLATELET
BASOS ABS: 0.1 10*3/uL (ref 0.0–0.1)
Basophils Relative: 1 %
EOS ABS: 0.1 10*3/uL (ref 0.0–0.7)
Eosinophils Relative: 1 %
HCT: 40.6 % (ref 36.0–46.0)
HEMOGLOBIN: 12.8 g/dL (ref 12.0–15.0)
LYMPHS PCT: 11 %
Lymphs Abs: 1.5 10*3/uL (ref 0.7–4.0)
MCH: 28.4 pg (ref 26.0–34.0)
MCHC: 31.5 g/dL (ref 30.0–36.0)
MCV: 90.2 fL (ref 78.0–100.0)
Monocytes Absolute: 0.6 10*3/uL (ref 0.1–1.0)
Monocytes Relative: 4 %
NEUTROS ABS: 11.5 10*3/uL — AB (ref 1.7–7.7)
NEUTROS PCT: 83 %
Platelets: 152 10*3/uL (ref 150–400)
RBC: 4.5 MIL/uL (ref 3.87–5.11)
RDW: 16.7 % — ABNORMAL HIGH (ref 11.5–15.5)
WBC: 13.8 10*3/uL — AB (ref 4.0–10.5)

## 2015-09-03 LAB — COMPREHENSIVE METABOLIC PANEL
ALBUMIN: 1.5 g/dL — AB (ref 3.5–5.0)
ALK PHOS: 363 U/L — AB (ref 38–126)
ALT: 30 U/L (ref 14–54)
ANION GAP: 6 (ref 5–15)
AST: 62 U/L — ABNORMAL HIGH (ref 15–41)
BUN: 9 mg/dL (ref 6–20)
CALCIUM: 8.2 mg/dL — AB (ref 8.9–10.3)
CO2: 25 mmol/L (ref 22–32)
Chloride: 104 mmol/L (ref 101–111)
Creatinine, Ser: 0.7 mg/dL (ref 0.44–1.00)
Glucose, Bld: 100 mg/dL — ABNORMAL HIGH (ref 65–99)
Potassium: 3.7 mmol/L (ref 3.5–5.1)
SODIUM: 135 mmol/L (ref 135–145)
TOTAL PROTEIN: 4.9 g/dL — AB (ref 6.5–8.1)
Total Bilirubin: 4 mg/dL — ABNORMAL HIGH (ref 0.3–1.2)

## 2015-09-03 LAB — CULTURE, BLOOD (ROUTINE X 2)

## 2015-09-03 LAB — VANCOMYCIN, TROUGH: Vancomycin Tr: 22 ug/mL — ABNORMAL HIGH (ref 10.0–20.0)

## 2015-09-03 LAB — PROCALCITONIN: PROCALCITONIN: 4.47 ng/mL

## 2015-09-03 MED ORDER — TRAMADOL HCL 50 MG PO TABS
50.0000 mg | ORAL_TABLET | Freq: Four times a day (QID) | ORAL | Status: DC | PRN
  Filled 2015-09-03: qty 1

## 2015-09-03 MED ORDER — DEXTROSE 5 % IV SOLN
2.0000 g | INTRAVENOUS | Status: DC
  Administered 2015-09-03 – 2015-09-07 (×5): 2 g via INTRAVENOUS
  Filled 2015-09-03 (×6): qty 2

## 2015-09-03 MED ORDER — OXYCODONE HCL 5 MG PO TABS
5.0000 mg | ORAL_TABLET | Freq: Three times a day (TID) | ORAL | Status: DC | PRN
  Administered 2015-09-03 – 2015-09-06 (×6): 5 mg via ORAL
  Filled 2015-09-03 (×7): qty 1

## 2015-09-03 MED ORDER — CETYLPYRIDINIUM CHLORIDE 0.05 % MT LIQD
7.0000 mL | Freq: Two times a day (BID) | OROMUCOSAL | Status: DC
  Administered 2015-09-03 – 2015-09-10 (×13): 7 mL via OROMUCOSAL

## 2015-09-03 MED ORDER — DEXTROSE 5 % IV SOLN: 1.0000 g | INTRAVENOUS | Status: DC

## 2015-09-03 NOTE — Care Management Note (Signed)
Case Management Note  Patient Details  Name: Basilio Cairoenny E Dayhoff MRN: [id number] Date of Birth: 10/03/1955  Subjective/Objective:                    Action/Plan:  UR updated. Will need oxygen qualifying saturation note if home oxygen needed  Expected Discharge Date:                  Expected Discharge Plan:  Home/Self Care  In-House Referral:     Discharge planning Services     Post Acute Care Choice:    Choice offered to:     DME Arranged:    DME Agency:     HH Arranged:    HH Agency:     Status of Service:  In process, will continue to follow  Medicare Important Message Given:    Date Medicare IM Given:    Medicare IM give by:    Date Additional Medicare IM Given:    Additional Medicare Important Message give by:     If discussed at Long Length of Stay Meetings, dates discussed:    Additional Comments:  Kingsley PlanWile, Alyxandria Wentz Marie, RN 09/03/2015, 11:02 AM

## 2015-09-03 NOTE — Progress Notes (Signed)
[facility] PROGRESS NOTE  RANYA FIDDLER ZOX:[id number] DOB: Jan 15, 1956 DOA: 09/01/2015 PCP: Bennie Pierini, FNP  Assessment/Plan: 1. Community-acquired pneumonia. -Mrs. Vest presenting with a one-month history of worsening cough associated  shortness of breath, found to have near opacification of left lung. -She was transferred to [facility] to be evaluated by pulmonary critical care medicine.  -She was started on broad-spectrum IV antimicrobial therapy with vancomycin, Levaquin, Flagyl. -Blood cultures are growing Streptococcus pneumonia, organism susceptible to cephalosporins. Will narrow antimicrobial regimen to ceftriaxone 2 g IV every 24 hours. -Repeat chest x-ray performed on 09/03/2015 showing slight improvement.   2.  Sepsis -Present on admission, evidenced by a white count of 17,300, lactic acid of 2.65, respiratory rate of 28, heart rate of 118, having positive blood cultures for Streptococcus pneumoniae. -Source of infection likely community-acquired pneumonia. -Patient showing gradual improvement over the past 24 hours. -Repeat x-ray showing slight improvement to left-sided infiltrates. -Susceptibility testing from blood cultures reviewed, organism is susceptible to cephalosporins. We'll narrow antibiotic regimen to ceftriaxone 2 g IV every 24 hours and discontinue vancomycin, clindamycin, Flagyl. -Repeat blood cultures.  3.  Acute hypoxemic respiratory failure -Present on admission, evidenced by a she presented in respiratory distress, had a respiratory rate of 40 and required BiPAP.  -Likely secondary to community-acquired pneumonia as x-ray revealed large left-sided infiltrate. -Patient showing gradual clinical improvement. Follow-up on repeat chest x-ray. Continue IV antibiotic therapy with ceftriaxone.  4.  Dyslipidemia. -Continue statin therapy  5.  Hypokalemia. -Potassium levels improved to 3.7 after receiving replacement.  Code Status: Full  code Family Communication: Family not present Disposition Plan: Plan to narrow antimicrobials today, transitioned to ceftriaxone 2 g IV every 24 hours. Will repeat blood cultures.   Consultants:  Pulmonary critical care medicine  Antibiotics:  Ceftriaxone 12 g IV every 24 hours (started on 09/03/2015)  Vancomycin stopped on 09/03/2015  Clindamycin stopped on 09/03/2015  Flagyl stopped on 09/03/2015  HPI/Subjective: Mrs. Palmatier is a pleasant 60 year old female with a past medical history of dyslipidemia, initially admitted by the pulmonary critical care service on 09/02/2015 when she presented with shortness of breath associate with cough and wheezing. She reported that she had been sick since Thanksgiving. She had a chest x-ray performed on 09/01/2015 that revealed complete opacification of the left hemithorax. She was started on broad-spectrum IV antimicrobial therapy with vancomycin, clindamycin, Flagyl. Repeat chest x-ray on 09/03/2015 showing slight improvement to left lung infiltrate. Blood cultures drawn on 09/01/2015 groing Streptococcus pneumoniae x 2. Organism susceptible to ceftriaxone.  Objective: Filed Vitals:   09/03/15 0145 09/03/15 0545  BP: 105/66 90/58  Pulse: 100 95  Temp: 97.9 F (36.6 C) 97.7 F (36.5 C)  Resp: 22 22    Intake/Output Summary (Last 24 hours) at 09/03/15 1312 Last data filed at 09/03/15 0545  Gross per 24 hour  Intake    750 ml  Output    675 ml  Net     75 ml   Filed Weights   09/01/15 0248 09/01/15 0642 09/02/15 0300  Weight: 87.091 kg (192 lb) 86.2 kg (190 lb 0.6 oz) 87.6 kg (193 lb 2 oz)    Exam:   General:  Patient is awake and alert, seems mildly confused, is following commands, still ill-appearing.  Cardiovascular: Regular rate and rhythm normal S1-S2 no murmurs rubs or gallops  Respiratory: Diminished breath sounds to left lung. There is rhonchi and crackles associate with left lung  Abdomen: Soft nontender  nondistended  Musculoskeletal: No  edema  Data Reviewed: Basic Metabolic Panel:  Recent Labs Lab 09/01/15 0319 09/02/15 0225 09/03/15 0405  NA 134* 138 135  K 3.0* 3.4* 3.7  CL 96* 104 104  CO2 25 25 25   GLUCOSE 99 103* 100*  BUN 19 14 9   CREATININE 0.69 0.68 0.70  CALCIUM 8.3* 7.8* 8.2*   Liver Function Tests:  Recent Labs Lab 09/03/15 0405  AST 62*  ALT 30  ALKPHOS 363*  BILITOT 4.0*  PROT 4.9*  ALBUMIN 1.5*   No results for input(s): LIPASE, AMYLASE in the last 168 hours. No results for input(s): AMMONIA in the last 168 hours. CBC:  Recent Labs Lab 09/01/15 0319 09/02/15 0225 09/03/15 0405  WBC 17.3* 10.7* 13.8*  NEUTROABS 15.8*  --  11.5*  HGB 14.9 12.2 12.8  HCT 44.0 38.6 40.6  MCV 88.9 90.2 90.2  PLT 139* 138* 152   Cardiac Enzymes:  Recent Labs Lab 09/01/15 0319  CKTOTAL 28*  TROPONINI <0.03   BNP (last 3 results) No results for input(s): BNP in the last 8760 hours.  ProBNP (last 3 results) No results for input(s): PROBNP in the last 8760 hours.  CBG:  Recent Labs Lab 09/01/15 0642 09/01/15 0810  GLUCAP 87 82    Recent Results (from the past 240 hour(s))  Blood Culture (routine x 2)     Status: None   Collection Time: 09/01/15  3:19 AM  Result Value Ref Range Status   Specimen Description BLOOD RIGHT HAND  Final   Special Requests BOTTLES DRAWN AEROBIC AND ANAEROBIC 6CC  Final   Culture  Setup Time   Final    GRAM POSITIVE COCCI IN PAIRS RECOVERED FROM BOTH BOTTLES Gram Stain Report Called to,Read Back By and Verified With: BEABRAUT,J. AT 1656 ON 09/01/2015 BY BAUGHAM,M. Performed at [facility]    Culture   Final    STREPTOCOCCUS PNEUMONIAE Performed at [facility]    Report Status 09/03/2015 FINAL  Final   Organism ID, Bacteria STREPTOCOCCUS PNEUMONIAE  Final      Susceptibility   Streptococcus pneumoniae - MIC*    ERYTHROMYCIN <=0.12 SENSITIVE Sensitive     LEVOFLOXACIN 1 SENSITIVE Sensitive      VANCOMYCIN 0.5 SENSITIVE Sensitive     PENICILLIN <=0.06 SENSITIVE Sensitive     CEFTRIAXONE <=0.12 SENSITIVE Sensitive     * STREPTOCOCCUS PNEUMONIAE  Blood Culture (routine x 2)     Status: None   Collection Time: 09/01/15  3:35 AM  Result Value Ref Range Status   Specimen Description BLOOD LEFT HAND  Final   Special Requests BOTTLES DRAWN AEROBIC ONLY 5CC  Final   Culture  Setup Time   Final    CORRECTED RESULTS GRAM POSITIVE COCCI IN PAIRS CORRECTED RESULTS CALLED TO: ROBBINS,L. ([facility]) AT 1100 ON 09/02/2015 BY BAUGHAM,M. Gram Stain Report Called to,Read Back By and Verified With: PREVIOUSLY TO BEABRAUT,J. AT 1656 ON 08/31/2014 BY BAUGHAM,M. Performed at [facility]    Culture   Final    STREPTOCOCCUS PNEUMONIAE SUSCEPTIBILITIES PERFORMED ON PREVIOUS CULTURE WITHIN THE LAST 5 DAYS. Performed at [facility]    Report Status 09/03/2015 FINAL  Final  Urine culture     Status: None   Collection Time: 09/01/15  4:00 AM  Result Value Ref Range Status   Specimen Description URINE, CATHETERIZED  Final   Special Requests NONE  Final   Culture   Final    NO GROWTH 1 DAY Performed at [facility]  Report Status 09/02/2015 FINAL  Final  MRSA PCR Screening     Status: Abnormal   Collection Time: 09/01/15  7:25 AM  Result Value Ref Range Status   MRSA by PCR POSITIVE (A) NEGATIVE Final    Comment:        The GeneXpert MRSA Assay (FDA approved for NASAL specimens only), is one component of a comprehensive MRSA colonization surveillance program. It is not intended to diagnose MRSA infection nor to guide or monitor treatment for MRSA infections. RESULT CALLED TO, READ BACK BY AND VERIFIED WITH: BEABRAUT RN 10:00 09/01/15 ([email or web address])      Studies: Ct Chest Wo Contrast  09/02/2015  CLINICAL DATA:  Pneumonia EXAM: CT CHEST WITHOUT CONTRAST TECHNIQUE: Multidetector CT imaging of the chest was performed following the standard protocol without IV contrast.  COMPARISON:  Chest x-ray 09/01/2015 FINDINGS: Extensive consolidation in the left upper lobe with expansion of left upper lobe and multiple air bronchograms and diffuse infiltrate. Multiple small gas pockets are present in the left upper lobe compatible with infection and necrosis. No definite abscess or air-fluid level. No definite mass lesion however mass lesion could be missed given lack of intravenous contrast and extensive consolidation. Minimal left pleural effusion. Mild dependent atelectasis in the lung bases left greater than right. No significant effusion on the right. Small areas infiltrate in the right upper lobe anteriorly. Heart size within normal limits. No pericardial effusion. Small very carina lymph nodes. No definite mass lesion. No lung nodule identified. Mild thoracic disc degeneration. Negative for fracture or spinal infection. Upper abdomen reveals no acute abnormality. IMPRESSION: Complete consolidation left upper lobe with air bronchograms and low multiple gas pockets in the lung suggesting necrotizing pneumonia. Minimal left pleural effusion. Mild dependent atelectasis in the lung bases left greater than right. Smaller infiltrate right upper lobe anteriorly. No evidence of mass lesion. Note that the patient did not receive intra intravenous contrast which limits evaluation for mass or lung abscess. Electronically Signed   By: Marlan Palau M.D.   On: 09/02/2015 07:32   Dg Chest Port 1 View  09/03/2015  CLINICAL DATA:  Pneumonia. EXAM: PORTABLE CHEST 1 VIEW COMPARISON:  09/02/2015 . FINDINGS: Mediastinum hilar structures are unremarkable . Diffuse left lung infiltrate, slight interim improvement. Right lung is clear. Cardiomegaly. No pleural effusion or pneumothorax. IMPRESSION: 1. Diffuse left lung infiltrate, slightly improved. Mild atelectasis and/or infiltrate right lung base. 2. Stable cardiomegaly.  No pulmonary venous congestion. Electronically Signed   By: Maisie Fus  Register   On:  09/03/2015 08:09   Dg Chest Port 1 View  09/02/2015  CLINICAL DATA:  Community-acquired pneumonia, current smoker, history of COPD, obesity. EXAM: PORTABLE CHEST 1 VIEW COMPARISON:  CT scan of the chest and chest x-ray dated September 01, 2015. FINDINGS: Slight improvement in aeration of the left lung is noted. A large amount of alveolar opacity persists throughout the left lung. There is no significant mediastinal shift. The right lung is well-expanded and clear. The right heart border is normal in appearance but the left heart border is obscured. The trachea is midline. The bony thorax is unremarkable. There is gaseous distention of the stomach or bowel under the hemidiaphragm. IMPRESSION: Findings consistent with widespread pneumonia on the left or less likely a central obstructing process. On the right the lung is clear. There is no pleural effusion or pneumothorax on the right. Gaseous distention of the stomach or bowel under in the upper abdomen. Electronically Signed   By: David  [place] M.D.  On: 09/02/2015 07:24    Scheduled Meds: . antiseptic oral rinse  7 mL Mouth Rinse BID  . atorvastatin  20 mg Oral q1800  . buPROPion  150 mg Oral Daily  . cefTRIAXone (ROCEPHIN)  IV  1 g Intravenous Q24H  . Chlorhexidine Gluconate Cloth  6 each Topical Q0600  . DULoxetine  60 mg Oral Daily  . heparin subcutaneous  5,000 Units Subcutaneous 3 times per day  . mupirocin ointment  1 application Nasal BID  . olopatadine  1 drop Both Eyes BID  . pantoprazole  40 mg Oral Daily   Continuous Infusions:   Active Problems:   CAP (community acquired pneumonia)    Time spent: 35 min    Jeralyn BennettZAMORA, [place]  [facility] Pager [phone]. If 7PM-7AM, please contact night-coverage at [email or web address], password [email or web address] 09/03/2015, 1:12 PM  LOS: 2 days

## 2015-09-04 ENCOUNTER — Inpatient Hospital Stay (HOSPITAL_COMMUNITY): Payer: Medicare Other

## 2015-09-04 ENCOUNTER — Encounter (HOSPITAL_COMMUNITY): Payer: Self-pay | Admitting: Radiology

## 2015-09-04 LAB — BASIC METABOLIC PANEL
Anion gap: 10 (ref 5–15)
BUN: 9 mg/dL (ref 6–20)
CALCIUM: 8.2 mg/dL — AB (ref 8.9–10.3)
CO2: 23 mmol/L (ref 22–32)
CREATININE: 0.53 mg/dL (ref 0.44–1.00)
Chloride: 105 mmol/L (ref 101–111)
GFR calc Af Amer: >60 mL/min (ref >60)
Glucose, Bld: 68 mg/dL (ref 65–99)
Potassium: 3.6 mmol/L (ref 3.5–5.1)
SODIUM: 138 mmol/L (ref 135–145)

## 2015-09-04 LAB — CBC
HCT: 41.9 % (ref 36.0–46.0)
Hemoglobin: 13.6 g/dL (ref 12.0–15.0)
MCH: 29.2 pg (ref 26.0–34.0)
MCHC: 32.5 g/dL (ref 30.0–36.0)
MCV: 90.1 fL (ref 78.0–100.0)
PLATELETS: 169 10*3/uL (ref 150–400)
RBC: 4.65 MIL/uL (ref 3.87–5.11)
RDW: 16.5 % — AB (ref 11.5–15.5)
WBC: 21.7 10*3/uL — AB (ref 4.0–10.5)

## 2015-09-04 LAB — BLOOD GAS, ARTERIAL
Acid-base deficit: 0.7 mmol/L (ref 0.0–2.0)
Bicarbonate: 24.9 mEq/L — ABNORMAL HIGH (ref 20.0–24.0)
Drawn by: 358491
O2 CONTENT: 3 L/min
O2 SAT: 97.3 %
PCO2 ART: 52.1 mmHg — AB (ref 35.0–45.0)
PH ART: 7.301 — AB (ref 7.350–7.450)
Patient temperature: 98.6
TCO2: 26.5 mmol/L (ref 0–100)
pO2, Arterial: 102 mmHg — ABNORMAL HIGH (ref 80.0–100.0)

## 2015-09-04 LAB — MAGNESIUM: MAGNESIUM: 1.8 mg/dL (ref 1.7–2.4)

## 2015-09-04 LAB — LACTIC ACID, PLASMA: LACTIC ACID, VENOUS: 1.2 mmol/L (ref 0.5–2.0)

## 2015-09-04 LAB — PHOSPHORUS: Phosphorus: 5.1 mg/dL — ABNORMAL HIGH (ref 2.5–4.6)

## 2015-09-04 LAB — BRAIN NATRIURETIC PEPTIDE: B NATRIURETIC PEPTIDE 5: 252.8 pg/mL — AB (ref 0.0–100.0)

## 2015-09-04 MED ORDER — FLUCONAZOLE IN SODIUM CHLORIDE 100-0.9 MG/50ML-% IV SOLN
100.0000 mg | INTRAVENOUS | Status: DC
  Administered 2015-09-04 – 2015-09-09 (×6): 100 mg via INTRAVENOUS
  Filled 2015-09-04 (×12): qty 50

## 2015-09-04 MED ORDER — IOHEXOL 300 MG/ML  SOLN
75.0000 mL | Freq: Once | INTRAMUSCULAR | Status: AC | PRN
  Administered 2015-09-04: 75 mL via INTRAVENOUS

## 2015-09-04 MED ORDER — CLINDAMYCIN PHOSPHATE 600 MG/50ML IV SOLN
600.0000 mg | Freq: Three times a day (TID) | INTRAVENOUS | Status: DC
  Administered 2015-09-04 – 2015-09-08 (×12): 600 mg via INTRAVENOUS
  Filled 2015-09-04 (×15): qty 50

## 2015-09-04 MED ORDER — SODIUM CHLORIDE 0.9 % IV SOLN
INTRAVENOUS | Status: DC
  Administered 2015-09-04: 15:00:00 via INTRAVENOUS
  Administered 2015-09-05: 100 mL/h via INTRAVENOUS
  Administered 2015-09-05 – 2015-09-06 (×3): via INTRAVENOUS

## 2015-09-04 NOTE — Consult Note (Signed)
   [facility] CM Inpatient Consult   09/04/2015  Bethany Cairoenny E Rabbani 12/25/1955 [id number]   Came to visit patient at bedside to discuss [facility] services. She was resting and asked for writer to come back at a later time.   Raiford NobleAtika Hall, MSN-Ed, RN,BSN [facility] Care Management Hospital Liaison [phone]

## 2015-09-04 NOTE — Progress Notes (Signed)
[facility] PROGRESS NOTE  Bethany Villa ZOX:[id number] DOB: 04-22-1956 DOA: 09/01/2015 PCP: Bennie Pierini, FNP  Assessment/Plan: 1. Community-acquired pneumonia. -Bethany Villa presenting with a one-month history of worsening cough associated  shortness of breath, found to have near opacification of left lung. -She was transferred to [facility] to be evaluated by pulmonary critical care medicine.  -She was started on broad-spectrum IV antimicrobial therapy with vancomycin, Levaquin, Flagyl. -Blood cultures are growing Streptococcus pneumonia, organism susceptible to cephalosporins. -Antimicrobials narrowed  to ceftriaxone 2 g IV every 24 hours on 09/04/2015 -On 09/04/2015 lab showing an upward trend in her white count to 21,700 from 13,800. Lactic acid within normal limits at 1.2. Her procalcitonin had trended down to 4.47 on 09/03/2015. She continues to appear ill, however, and with her elevated white count I am concerned with the possibility of a developing empyema/loculation for which I will order a CT scan of lungs with IV.  Meanwhile continue current antibiotics.   2.  Sepsis  -Present on admission, evidenced by a white count of 17,300, lactic acid of 2.65, respiratory rate of 28, heart rate of 118, having positive blood cultures for Streptococcus pneumoniae. -Source of infection likely community-acquired pneumonia. -Patient showing gradual improvement over the past 24 hours. -Repeat x-ray showing slight improvement to left-sided infiltrates. -Susceptibility testing from blood cultures reviewed, organism is susceptible to cephalosporins. Narrowed antibiotic regimen to ceftriaxone 2 g IV every 24 hours and discontinued vancomycin, clindamycin, Flagyl. -Repeat blood cultures drawn on 09/03/2015 showing no growth after overnight incubation.  3.  Acute hypoxemic respiratory failure -Present on admission, evidenced by a she presented in respiratory distress, had a respiratory  rate of 40 and required BiPAP.  -Likely secondary to community-acquired pneumonia as x-ray revealed large left-sided infiltrate. -Plan to assess for possible superimposed congestive heart failure, will check a transthoracic echocardiogram. - Follow-up CT scan of Lungs.  -Will check an ABG  -Continue IV antibiotic therapy with ceftriaxone.  4.  Dyslipidemia. -Continue statin therapy  5.  Hypokalemia. -Potassium levels improved to 3.7 after receiving replacement.  Code Status: Full code Family Communication: Family not present, called Friend who was listed on emergency contact on epic  Disposition Plan: Will further workup with CT scan with IV contrast.  Transfer to SDU for close monitoring.    Consultants:  Pulmonary critical care medicine  Antibiotics:  Ceftriaxone 12 g IV every 24 hours (started on 09/03/2015)  Vancomycin stopped on 09/03/2015  Clindamycin stopped on 09/03/2015  Flagyl stopped on 09/03/2015  HPI/Subjective: Bethany Villa is a pleasant 60 year old female with a past medical history of dyslipidemia, initially admitted by the pulmonary critical care service on 09/02/2015 when she presented with shortness of breath associate with cough and wheezing. She reported that she had been sick since Thanksgiving. She had a chest x-ray performed on 09/01/2015 that revealed complete opacification of the left hemithorax. She was started on broad-spectrum IV antimicrobial therapy with vancomycin, clindamycin, Flagyl. Repeat chest x-ray on 09/03/2015 showing slight improvement to left lung infiltrate. Blood cultures drawn on 09/01/2015 groing Streptococcus pneumoniae x 2. Organism susceptible to ceftriaxone.  Objective: Filed Vitals:   09/03/15 2134 09/04/15 0602  BP: 117/78 114/85  Pulse: 100 98  Temp: 98.6 F (37 C) 98.9 F (37.2 C)  Resp: 19 18    Intake/Output Summary (Last 24 hours) at 09/04/15 1404 Last data filed at 09/04/15 1100  Gross per 24 hour  Intake    240  ml  Output   2850 ml  Net  -2610 ml   Filed Weights   09/01/15 0248 09/01/15 0642 09/02/15 0300  Weight: 87.091 kg (192 lb) 86.2 kg (190 lb 0.6 oz) 87.6 kg (193 lb 2 oz)    Exam:   General:  Patient is ill appearing, mildly confused, is able to follow simple commands.   Cardiovascular: Regular rate and rhythm normal S1-S2 no murmurs rubs or gallops  Respiratory: Diminished breath sounds to left lung. There is rhonchi and crackles associate with left lung  Abdomen: Soft nontender nondistended  Musculoskeletal: No edema  Data Reviewed: Basic Metabolic Panel:  Recent Labs Lab 09/01/15 0319 09/02/15 0225 09/03/15 0405 09/04/15 0615  NA 134* 138 135 138  K 3.0* 3.4* 3.7 3.6  CL 96* 104 104 105  CO2 25 25 25 23   GLUCOSE 99 103* 100* 68  BUN 19 14 9 9   CREATININE 0.69 0.68 0.70 0.53  CALCIUM 8.3* 7.8* 8.2* 8.2*   Liver Function Tests:  Recent Labs Lab 09/03/15 0405  AST 62*  ALT 30  ALKPHOS 363*  BILITOT 4.0*  PROT 4.9*  ALBUMIN 1.5*   No results for input(s): LIPASE, AMYLASE in the last 168 hours. No results for input(s): AMMONIA in the last 168 hours. CBC:  Recent Labs Lab 09/01/15 0319 09/02/15 0225 09/03/15 0405 09/04/15 0615  WBC 17.3* 10.7* 13.8* 21.7*  NEUTROABS 15.8*  --  11.5*  --   HGB 14.9 12.2 12.8 13.6  HCT 44.0 38.6 40.6 41.9  MCV 88.9 90.2 90.2 90.1  PLT 139* 138* 152 169   Cardiac Enzymes:  Recent Labs Lab 09/01/15 0319  CKTOTAL 28*  TROPONINI <0.03   BNP (last 3 results) No results for input(s): BNP in the last 8760 hours.  ProBNP (last 3 results) No results for input(s): PROBNP in the last 8760 hours.  CBG:  Recent Labs Lab 09/01/15 0642 09/01/15 0810  GLUCAP 87 82    Recent Results (from the past 240 hour(s))  Blood Culture (routine x 2)     Status: None   Collection Time: 09/01/15  3:19 AM  Result Value Ref Range Status   Specimen Description BLOOD RIGHT HAND  Final   Special Requests BOTTLES DRAWN AEROBIC  AND ANAEROBIC 6CC  Final   Culture  Setup Time   Final    GRAM POSITIVE COCCI IN PAIRS RECOVERED FROM BOTH BOTTLES Gram Stain Report Called to,Read Back By and Verified With: BEABRAUT,J. AT 1656 ON 09/01/2015 BY BAUGHAM,M. Performed at [facility]    Culture   Final    STREPTOCOCCUS PNEUMONIAE Performed at [facility]    Report Status 09/03/2015 FINAL  Final   Organism ID, Bacteria STREPTOCOCCUS PNEUMONIAE  Final      Susceptibility   Streptococcus pneumoniae - MIC*    ERYTHROMYCIN <=0.12 SENSITIVE Sensitive     LEVOFLOXACIN 1 SENSITIVE Sensitive     VANCOMYCIN 0.5 SENSITIVE Sensitive     PENICILLIN <=0.06 SENSITIVE Sensitive     CEFTRIAXONE <=0.12 SENSITIVE Sensitive     * STREPTOCOCCUS PNEUMONIAE  Blood Culture (routine x 2)     Status: None   Collection Time: 09/01/15  3:35 AM  Result Value Ref Range Status   Specimen Description BLOOD LEFT HAND  Final   Special Requests BOTTLES DRAWN AEROBIC ONLY 5CC  Final   Culture  Setup Time   Final    CORRECTED RESULTS GRAM POSITIVE COCCI IN PAIRS CORRECTED RESULTS CALLED TO: ROBBINS,L. ([facility]) AT 1100 ON 09/02/2015 BY BAUGHAM,M. Gram Stain Report  Called to,Read Back By and Verified With: PREVIOUSLY TO BEABRAUT,J. AT 1656 ON 08/31/2014 BY BAUGHAM,M. Performed at [facility]    Culture   Final    STREPTOCOCCUS PNEUMONIAE SUSCEPTIBILITIES PERFORMED ON PREVIOUS CULTURE WITHIN THE LAST 5 DAYS. Performed at [facility]    Report Status 09/03/2015 FINAL  Final  Urine culture     Status: None   Collection Time: 09/01/15  4:00 AM  Result Value Ref Range Status   Specimen Description URINE, CATHETERIZED  Final   Special Requests NONE  Final   Culture   Final    NO GROWTH 1 DAY Performed at [facility]    Report Status 09/02/2015 FINAL  Final  MRSA PCR Screening     Status: Abnormal   Collection Time: 09/01/15  7:25 AM  Result Value Ref Range Status   MRSA by PCR POSITIVE (A) NEGATIVE Final     Comment:        The GeneXpert MRSA Assay (FDA approved for NASAL specimens only), is one component of a comprehensive MRSA colonization surveillance program. It is not intended to diagnose MRSA infection nor to guide or monitor treatment for MRSA infections. RESULT CALLED TO, READ BACK BY AND VERIFIED WITH: BEABRAUT RN 10:00 09/01/15 ([email or web address])   Culture, blood (Routine X 2) w Reflex to ID Panel     Status: None (Preliminary result)   Collection Time: 09/03/15  2:45 PM  Result Value Ref Range Status   Specimen Description BLOOD LEFT ANTECUBITAL  Final   Special Requests IN PEDIATRIC BOTTLE 2CC  Final   Culture NO GROWTH < 24 HOURS  Final   Report Status PENDING  Incomplete  Culture, blood (Routine X 2) w Reflex to ID Panel     Status: None (Preliminary result)   Collection Time: 09/03/15  2:52 PM  Result Value Ref Range Status   Specimen Description BLOOD LEFT HAND  Final   Special Requests IN PEDIATRIC BOTTLE .5CC  Final   Culture NO GROWTH < 24 HOURS  Final   Report Status PENDING  Incomplete     Studies: Dg Chest 2 View  09/04/2015  CLINICAL DATA:  Cough and shortness of breath for the past month EXAM: CHEST  2 VIEW COMPARISON:  Portable chest x-ray of September 03, 2015 FINDINGS: The right lung is clear. On the left there is persistent interstitial and alveolar opacity throughout the upper lobe. There is hazy increased density in the lower lobe with obscuration of the heart border. There is pleural fluid blunting the posterior costophrenic angle on the left. The bony thorax exhibits no acute abnormality. IMPRESSION: Persistent infiltrate throughout the left lung worrisome for pneumonia. Improved appearance of the right lung base. No evidence of pulmonary edema. Electronically Signed   By: David  [place] M.D.   On: 09/04/2015 09:17   Dg Chest Port 1 View  09/03/2015  CLINICAL DATA:  Pneumonia. EXAM: PORTABLE CHEST 1 VIEW COMPARISON:  09/02/2015 . FINDINGS: Mediastinum hilar structures are  unremarkable . Diffuse left lung infiltrate, slight interim improvement. Right lung is clear. Cardiomegaly. No pleural effusion or pneumothorax. IMPRESSION: 1. Diffuse left lung infiltrate, slightly improved. Mild atelectasis and/or infiltrate right lung base. 2. Stable cardiomegaly.  No pulmonary venous congestion. Electronically Signed   By: Maisie Fus  Register   On: 09/03/2015 08:09    Scheduled Meds: . antiseptic oral rinse  7 mL Mouth Rinse BID  . atorvastatin  20 mg Oral q1800  . buPROPion  150 mg Oral Daily  .  cefTRIAXone (ROCEPHIN)  IV  2 g Intravenous Q24H  . Chlorhexidine Gluconate Cloth  6 each Topical Q0600  . clindamycin (CLEOCIN) IV  600 mg Intravenous 3 times per day  . DULoxetine  60 mg Oral Daily  . heparin subcutaneous  5,000 Units Subcutaneous 3 times per day  . mupirocin ointment  1 application Nasal BID  . olopatadine  1 drop Both Eyes BID  . pantoprazole  40 mg Oral Daily   Continuous Infusions:   Principal Problem:   CAP (community acquired pneumonia) Active Problems:   Sepsis (HCC)   Respiratory failure, acute (HCC)    Time spent: 35 min    Emmelyn Schmale  [facility] Pager [phone]. If 7PM-7AM, please [phone] night-coverage at [email or web address], password [email or web address] 09/04/2015, 2:04 PM  LOS: 3 days

## 2015-09-04 NOTE — Clinical Documentation Improvement (Signed)
Hospitalist  Please document the clinical significance, if any, including any associated condition(s) and/or cause(s) regarding the abnormal LFT's noted below documented in the progress note 09/03/15 by Dr. Vanessa BarbaraZamora  Lab 09/03/15 at 0405  AST 62*  ALT 30  ALKPHOS 363*  BILITOT 4.0* PROT 4.9*  ALBUMIN 1.5*   Please exercise your independent, professional judgment when responding. A specific answer is not anticipated or expected.   Thank You, Jerral Ralphathy R Rochele Lueck  RN BSN CCDS [phone] Health Information Management [place]

## 2015-09-04 NOTE — Progress Notes (Signed)
Follow up note  ABG showed pH of 7.3 and PCO2 of 52. Spoke with PCCM Will place her on BiPAP this evening, repeat ABG in am

## 2015-09-05 ENCOUNTER — Inpatient Hospital Stay (HOSPITAL_COMMUNITY): Payer: Medicare Other

## 2015-09-05 DIAGNOSIS — R0600 Dyspnea, unspecified: Secondary | ICD-10-CM

## 2015-09-05 DIAGNOSIS — J189 Pneumonia, unspecified organism: Secondary | ICD-10-CM

## 2015-09-05 LAB — BASIC METABOLIC PANEL
ANION GAP: 8 (ref 5–15)
BUN: 10 mg/dL (ref 6–20)
CALCIUM: 7.8 mg/dL — AB (ref 8.9–10.3)
CO2: 26 mmol/L (ref 22–32)
CREATININE: 0.59 mg/dL (ref 0.44–1.00)
Chloride: 105 mmol/L (ref 101–111)
Glucose, Bld: 89 mg/dL (ref 65–99)
Potassium: 5.2 mmol/L — ABNORMAL HIGH (ref 3.5–5.1)
SODIUM: 139 mmol/L (ref 135–145)

## 2015-09-05 LAB — POCT I-STAT 3, ART BLOOD GAS (G3+)
Bicarbonate: 28.7 mEq/L — ABNORMAL HIGH (ref 20.0–24.0)
O2 Saturation: 97 %
PCO2 ART: 62.4 mmHg — AB (ref 35.0–45.0)
PH ART: 7.271 — AB (ref 7.350–7.450)
Patient temperature: 98.6
TCO2: 31 mmol/L (ref 0–100)
pO2, Arterial: 105 mmHg — ABNORMAL HIGH (ref 80.0–100.0)

## 2015-09-05 LAB — BLOOD GAS, ARTERIAL
ACID-BASE EXCESS: 1.1 mmol/L (ref 0.0–2.0)
ACID-BASE EXCESS: 1.7 mmol/L (ref 0.0–2.0)
ACID-BASE EXCESS: 0.7 mmol/L (ref 0.0–2.0)
BICARBONATE: 26.4 meq/L — AB (ref 20.0–24.0)
BICARBONATE: 26.2 meq/L — AB (ref 20.0–24.0)
Bicarbonate: 28.1 mEq/L — ABNORMAL HIGH (ref 20.0–24.0)
DELIVERY SYSTEMS: POSITIVE
DELIVERY SYSTEMS: POSITIVE
DRAWN BY: 426241
Delivery systems: POSITIVE
Drawn by: 313941
Drawn by: 398661
EXPIRATORY PAP: 5
Expiratory PAP: 5
Expiratory PAP: 6
FIO2: 0.5
FIO2: 0.4
INSPIRATORY PAP: 12
INSPIRATORY PAP: 18
Inspiratory PAP: 12
LHR: 16 {breaths}/min
MODE: POSITIVE
O2 Content: 2 L/min
O2 SAT: 85.7 %
O2 Saturation: 96.8 %
O2 Saturation: 96.2 %
PCO2 ART: 51.5 mmHg — AB (ref 35.0–45.0)
PCO2 ART: 61.9 mmHg — AB (ref 35.0–45.0)
PEEP: 5 cmH2O
PH ART: 7.274 — AB (ref 7.350–7.450)
PO2 ART: 53.2 mmHg — AB (ref 80.0–100.0)
PO2 ART: 89.8 mmHg (ref 80.0–100.0)
Patient temperature: 98.6
Patient temperature: 97.1
Patient temperature: 98.6
TCO2: 28 mmol/L (ref 0–100)
TCO2: 30.1 mmol/L (ref 0–100)
TCO2: 27.9 mmol/L (ref 0–100)
pCO2 arterial: 53.5 mmHg — ABNORMAL HIGH (ref 35.0–45.0)
pH, Arterial: 7.33 — ABNORMAL LOW (ref 7.350–7.450)
pH, Arterial: 7.312 — ABNORMAL LOW (ref 7.350–7.450)
pO2, Arterial: 100 mmHg (ref 80.0–100.0)

## 2015-09-05 LAB — CBC
HEMATOCRIT: 36.4 % (ref 36.0–46.0)
Hemoglobin: 11.8 g/dL — ABNORMAL LOW (ref 12.0–15.0)
MCH: 29.9 pg (ref 26.0–34.0)
MCHC: 32.4 g/dL (ref 30.0–36.0)
MCV: 92.2 fL (ref 78.0–100.0)
Platelets: 164 10*3/uL (ref 150–400)
RBC: 3.95 MIL/uL (ref 3.87–5.11)
RDW: 16.5 % — AB (ref 11.5–15.5)
WBC: 23.1 10*3/uL — AB (ref 4.0–10.5)

## 2015-09-05 MED ORDER — IPRATROPIUM-ALBUTEROL 0.5-2.5 (3) MG/3ML IN SOLN: 3.0000 mL | RESPIRATORY_TRACT | Status: DC | PRN

## 2015-09-05 MED ORDER — METHYLPREDNISOLONE SODIUM SUCC 40 MG IJ SOLR
40.0000 mg | Freq: Two times a day (BID) | INTRAMUSCULAR | Status: DC
  Administered 2015-09-05 – 2015-09-08 (×6): 40 mg via INTRAVENOUS
  Filled 2015-09-05 (×6): qty 1

## 2015-09-05 MED ORDER — IPRATROPIUM-ALBUTEROL 0.5-2.5 (3) MG/3ML IN SOLN
RESPIRATORY_TRACT | Status: AC
  Administered 2015-09-05: 3 mL
  Filled 2015-09-05: qty 3

## 2015-09-05 NOTE — Progress Notes (Signed)
eLink Physician-Brief Progress Note Patient Name: Bethany Villa DOB: 11/23/1955 MRN: [id number]   Date of Service  09/05/2015  HPI/Events of Note   Recent Labs Lab 09/01/15 0325 09/04/15 1455 09/05/15 0423 09/05/15 1420 09/05/15 1716  PHART 7.361 7.301* 7.330* 7.274* 7.271*  PCO2ART 44.8 52.1* 51.5* 61.9* 62.4*  PO2ART 64.2* 102* 53.2* 100 105.0*  HCO3 23.9 24.9* 26.4* 28.1* 28.7*  TCO2 18.9 26.5 28.0 30.1 31  O2SAT 90.8 97.3 85.7 96.8 97.0     eICU Interventions  abg review shows hypercapnia between last 2 readings same v worsening trend. Overall worsening trend  Plan Repeat abg in 2-3h     Intervention Category Major Interventions: Acid-Base disturbance - evaluation and management;Respiratory failure - evaluation and management  Lynnex Fulp 09/05/2015, 5:52 PM

## 2015-09-05 NOTE — Progress Notes (Signed)
  Echocardiogram 2D Echocardiogram has been performed.  Tye SavoyCasey N [name] 09/05/2015, 8:55 AM

## 2015-09-05 NOTE — H&P (Signed)
PULMONARY / CRITICAL CARE MEDICINE   Name: Bethany Villa MRN: [id number] DOB: [birth year]    ADMISSION DATE:  09/01/2015 CONSULTATION DATE:  09/01/15  REFERRING MD:  Dr. Bebe Shaggy / [facility]   CHIEF COMPLAINT:  SOB   HISTORY OF PRESENT ILLNESS:   60 y/o F with PMH of DJD, GERD, HLD, fibromyalgia, chronic pain (followed at a pain clinic), anxiety / depression, and O2 dependent COPD who presented to [facility] on 09/01/15 with complaints of SOB.   The patient reported a two week history of shortness of breath, non-productive cough, and wheezing.  She attempted home nebulized albuterol without relief of symptoms.  The patient reports she had to move around Thanksgiving into a new home and feels she became sick to to "how cold it stays in the house".  She has noted 30+ lb weight loss over the last month.  Last office visit note prior to 07/2015 was in 09/2014 where her weight was recorded at 215 lbs.  She denies night sweats but endorses decreased appetite.  On presentation, she reported 2 wks of SOB and dark urine.  Initial labs notable for Na 134, K 3.0, Cl 96, CK 28, troponin 0.03, WBC 17.3, Hgb 14.9, and platelets 139.  UA negative.  CXR evaluation demonstrated near opacification of the left hemithorax.  [facility] was unable to evaluate for thoracentesis and she was transferred to [facility] for further evaluation.   At [facility] further evaluation revealed that she did not have a pleural effusion but dense consolidation in the left lung. Transferred out of the ICU. She continued to do well with improvement in aeration on the left lung. However she is transferred back to the stepdown today with increasing lethargy, hypercarbia on BiPAP. PCCM asked to consult again.  SUBJECTIVE: On BiPAP, awake, alert, oriented, no distress  VITAL SIGNS: BP 98/65 mmHg  Pulse 87  Temp(Src) 98.7 F (37.1 C) (Axillary)  Resp 18  Ht 5\' 4"  (1.626 m)  Wt 193 lb 2 oz (87.6 kg)  BMI 33.13 kg/m2  SpO2 100%  HEMODYNAMICS:    VENTILATOR  SETTINGS: Vent Mode:  [-]  FiO2 (%):  [40 %-50 %] 40 %  INTAKE / OUTPUT: I/O last 3 completed shifts: In: 2415 [P.O.:600; I.V.:1615; IV Piggyback:200] Out: 2400 [Urine:2400]  PHYSICAL EXAMINATION: General:  Obese female in NAD Neuro:  AAOx4, no focal deficits  HEENT:  Moist. His membrane, no thyromegaly, JVD Cardiovascular:  S1, S2, no murmurs rubs gallops Lungs:  Rhonchi left lung. Abdomen:  Obese, soft, positive bowel sounds Musculoskeletal:  No acute deformities  Skin:  Warm/dry, L foot with fine lacy erythema, blanches, 1+ BLE pitting edema  LABS:  BMET  Recent Labs Lab 09/03/15 0405 09/04/15 0615 09/05/15 1604  NA 135 138 139  K 3.7 3.6 5.2*  CL 104 105 105  CO2 25 23 26   BUN 9 9 10   CREATININE 0.70 0.53 0.59  GLUCOSE 100* 68 89    Electrolytes  Recent Labs Lab 09/03/15 0405 09/04/15 0615 09/04/15 1609 09/05/15 1604  CALCIUM 8.2* 8.2*  --  7.8*  MG  --   --  1.8  --   PHOS  --   --  5.1*  --     CBC  Recent Labs Lab 09/03/15 0405 09/04/15 0615 09/05/15 1604  WBC 13.8* 21.7* PENDING  HGB 12.8 13.6 11.8*  HCT 40.6 41.9 36.4  PLT 152 169 PENDING    Coag's No results for input(s): APTT, INR in the last 168 hours.  Sepsis Markers  Recent Labs Lab 09/01/15 0335 09/01/15 0826 09/01/15 1510 09/02/15 0225 09/03/15 0405 09/04/15 1203  LATICACIDVEN 2.65* 1.7  --   --   --  1.2  PROCALCITON  --   --  7.28 6.87 4.47  --     ABG  Recent Labs Lab 09/04/15 1455 09/05/15 0423 09/05/15 1420  PHART 7.301* 7.330* 7.274*  PCO2ART 52.1* 51.5* 61.9*  PO2ART 102* 53.2* 100    Liver Enzymes  Recent Labs Lab 09/03/15 0405  AST 62*  ALT 30  ALKPHOS 363*  BILITOT 4.0*  ALBUMIN 1.5*    Cardiac Enzymes  Recent Labs Lab 09/01/15 0319  TROPONINI <0.03    Glucose  Recent Labs Lab 09/01/15 0642 09/01/15 0810  GLUCAP 87 82    Imaging No results found.   STUDIES:  1/02  L Pleural Assessment >> small amount pleural fluid,  first image would consider diagnostic thora only, second more lateral appears to have larger pocket, see images above 1/3 ct chest>>>dense necrotizing PNA 1/5 >>  improved aeration  CULTURES: UA 1/2 >> negative Blood culture 1/2 >> strep pneumonia   ANTIBIOTICS:  PCN/Sulfa Allergy Vanco 1/2 >> 1/4 Levaquin 1/2 >> 1/4 clinda 1/3>>> 1/5 Ceftiaxone 1/4 >> Fluconazole 1/5 >>  SIGNIFICANT EVENTS: 1/02  Admit from [facility] with SOB, weight loss, opacification of L hemithorax   LINES/TUBES:   DISCUSSION: 60 y/o smoker with a PMH of O2 dependent COPD, anxiety/depression/fibromyalgia admitted 1/2 with a 2 wk hx of SOB, found to have opacification of the left pneumonia.  [facility].  Initial presentation with elevated Lactic acid cleared with volume resuscitation.    She has dense opacification of the left lung with no pleural effusions, Streptococcus pneumonia bacteremia.  Repeat CT scan 1/5 shows improving aeration.  Transferred to the stepdown today with hypercarbic respiratory failure requiring BiPAP .She appears alert and oriented in no distress.  Reccs: - Continue BiPAP. Inspiratory pressure increased to 18/5. - Continue to monitor in SDU. Repeat ABG on current settings. - Start Solumedrol 40 mg IV q12 as this has been shown to reduce complications of severe pneumonia - Continue ceftriaxone - Follow procalcitonin - Repeat CXR in AM.  Chilton GreathousePraveen Storey Stangeland MD [facility] Pager [phone] If no answer or after 3pm call: [phone] 09/05/2015, 5:20 PM

## 2015-09-05 NOTE — Progress Notes (Signed)
CPT Vest on Hold due to respiratory status- MD moving pt to higher level of care. Pt transfer to ICU

## 2015-09-05 NOTE — Progress Notes (Signed)
CRITICAL VALUE ALERT  Critical value received:  ABG result  Date of notification:  09/05/15  Time of notification:  1445  Critical value read back:yes  Nurse who received alert: Clancy GourdPamela Vanecia Limpert RN  MD notified (1st page):  Dr. Vanessa BarbaraZamora  Time of first page: 1500  MD notified (2nd page):  Time of second page:  Responding MD:  Dr Vanessa BarbaraZamora  Time MD responded:  [phone]

## 2015-09-05 NOTE — Progress Notes (Signed)
[facility] PROGRESS NOTE  [name]:[id number] DOB: [birth year] DOA: 09/01/2015 PCP: Bennie Pierini, FNP  Assessment/Plan: 1. Community-acquired pneumonia. -[name] presenting with a one-month history of worsening cough associated  shortness of breath, found to have near opacification of left lung. -She was transferred to [facility] to be evaluated by pulmonary critical care medicine.  -She was started initially on broad-spectrum IV antimicrobial therapy with vancomycin, Levaquin, Flagyl. -Blood cultures are growing Streptococcus pneumonia, organism susceptible to cephalosporins. -Antimicrobials narrowed  to ceftriaxone 2 g IV every 24 hours on 09/04/2015 -On 09/04/2015 lab showing an upward trend in her white count to 21,700 from 13,800. Lactic acid within normal limits at 1.2. Her procalcitonin had trended down to 4.47 on 09/03/2015. She continues to appear ill, however, and with her elevated white  -Repeat CT scan of lungs with IV contrast performed on 09/04/2015 showing improved aeration of left upper lobe compared to prior study although continues to have findings consistent with pneumonia. Study did not reveal evidence of loculation. -For now will continue empiric IV antibiotic therapy with ceftriaxone 2 g IV every 24 hours. Repeat blood cultures from 09/03/2015 showing no growth to date.  2.  Sepsis  -Present on admission, evidenced by a white count of 17,300, lactic acid of 2.65, respiratory rate of 28, heart rate of 118, having positive blood cultures for Streptococcus pneumoniae. -Source of infection likely community-acquired pneumonia. -Patient showing gradual improvement over the past 24 hours. -Repeat x-ray showing slight improvement to left-sided infiltrates. -Susceptibility testing from blood cultures reviewed, organism is susceptible to cephalosporins. Narrowed antibiotic regimen to ceftriaxone 2 g IV every 24 hours and discontinued vancomycin,  clindamycin, Flagyl. -Repeat blood cultures drawn on 09/03/2015 showing no growth to date.  3.  Acute hypoxemic hypercarbic respiratory failure -Present on admission, evidenced by a she presented in respiratory distress, had a respiratory rate of 40 and required BiPAP.  -Likely secondary to community-acquired pneumonia as x-ray revealed large left-sided infiltrate. -Transthoracic echocardiogram did not reveal evidence for CHF. -ABG revealed pH of 7.3 with PCO2 of 52. Spoke with respiratory therapy, patients will need to be transferred to stepdown units for BiPAP.  -Lab to repeat ABG after 1 hour of BiPAP  4.  Dyslipidemia. -Continue statin therapy  5.  Hypokalemia. -Potassium levels improved to 3.7 after receiving replacement.  Code Status: Full code Family Communication: Family not present, called Friend who was listed on emergency contact on epic  Disposition Plan: Plan to transfer to SDU for close monitoring. Will started on BiPAP   Consultants:  Pulmonary critical care medicine  Antibiotics:  Ceftriaxone 12 g IV every 24 hours (started on 09/03/2015)  Vancomycin stopped on 09/03/2015  Clindamycin stopped on 09/03/2015  Flagyl stopped on 09/03/2015  HPI/Subjective: Bethany Villa is a pleasant 60 year old female with a past medical history of dyslipidemia, initially admitted by the pulmonary critical care service on 09/02/2015 when she presented with shortness of breath associate with cough and wheezing. She reported that she had been sick since Thanksgiving. She had a chest x-ray performed on 09/01/2015 that revealed complete opacification of the left hemithorax. She was started on broad-spectrum IV antimicrobial therapy with vancomycin, clindamycin, Flagyl. Repeat chest x-ray on 09/03/2015 showing slight improvement to left lung infiltrate. Blood cultures drawn on 09/01/2015 groing Streptococcus pneumoniae x 2. Organism susceptible to ceftriaxone.  Objective: Filed Vitals:    09/05/15 0449 09/05/15 1208  BP: 118/71 97/68  Pulse: 90 84  Temp: 98.3 F (36.8 C)   Resp:  19 21    Intake/Output Summary (Last 24 hours) at 09/05/15 1244 Last data filed at 09/05/15 1100  Gross per 24 hour  Intake   2415 ml  Output    700 ml  Net   1715 ml   Filed Weights   09/01/15 0248 09/01/15 0642 09/02/15 0300  Weight: 87.091 kg (192 lb) 86.2 kg (190 lb 0.6 oz) 87.6 kg (193 lb 2 oz)    Exam:   General:  Patient seems mildly improved today compared to yesterday. She is little more awake.   Cardiovascular: Regular rate and rhythm normal S1-S2 no murmurs rubs or gallops  Respiratory: Diminished breath sounds to left lung. There is rhonchi and crackles associate with left lung, no significant change from yesterday.   Abdomen: Soft nontender nondistended  Musculoskeletal: No edema  Data Reviewed: Basic Metabolic Panel:  Recent Labs Lab 09/01/15 0319 09/02/15 0225 09/03/15 0405 09/04/15 0615 09/04/15 1609  NA 134* 138 135 138  --   K 3.0* 3.4* 3.7 3.6  --   CL 96* 104 104 105  --   CO2 25 25 25 23   --   GLUCOSE 99 103* 100* 68  --   BUN 19 14 9 9   --   CREATININE 0.69 0.68 0.70 0.53  --   CALCIUM 8.3* 7.8* 8.2* 8.2*  --   MG  --   --   --   --  1.8  PHOS  --   --   --   --  5.1*   Liver Function Tests:  Recent Labs Lab 09/03/15 0405  AST 62*  ALT 30  ALKPHOS 363*  BILITOT 4.0*  PROT 4.9*  ALBUMIN 1.5*   No results for input(s): LIPASE, AMYLASE in the last 168 hours. No results for input(s): AMMONIA in the last 168 hours. CBC:  Recent Labs Lab 09/01/15 0319 09/02/15 0225 09/03/15 0405 09/04/15 0615  WBC 17.3* 10.7* 13.8* 21.7*  NEUTROABS 15.8*  --  11.5*  --   HGB 14.9 12.2 12.8 13.6  HCT 44.0 38.6 40.6 41.9  MCV 88.9 90.2 90.2 90.1  PLT 139* 138* 152 169   Cardiac Enzymes:  Recent Labs Lab 09/01/15 0319  CKTOTAL 28*  TROPONINI <0.03   BNP (last 3 results)  Recent Labs  09/04/15 1609  BNP 252.8*    ProBNP (last 3  results) No results for input(s): PROBNP in the last 8760 hours.  CBG:  Recent Labs Lab 09/01/15 0642 09/01/15 0810  GLUCAP 87 82    Recent Results (from the past 240 hour(s))  Blood Culture (routine x 2)     Status: None   Collection Time: 09/01/15  3:19 AM  Result Value Ref Range Status   Specimen Description BLOOD RIGHT HAND  Final   Special Requests BOTTLES DRAWN AEROBIC AND ANAEROBIC 6CC  Final   Culture  Setup Time   Final    GRAM POSITIVE COCCI IN PAIRS RECOVERED FROM BOTH BOTTLES Gram Stain Report Called to,Read Back By and Verified With: BEABRAUT,J. AT 1656 ON 09/01/2015 BY BAUGHAM,M. Performed at [facility]    Culture   Final    STREPTOCOCCUS PNEUMONIAE Performed at [facility]    Report Status 09/03/2015 FINAL  Final   Organism ID, Bacteria STREPTOCOCCUS PNEUMONIAE  Final      Susceptibility   Streptococcus pneumoniae - MIC*    ERYTHROMYCIN <=0.12 SENSITIVE Sensitive     LEVOFLOXACIN 1 SENSITIVE Sensitive     VANCOMYCIN 0.5 SENSITIVE Sensitive  PENICILLIN <=0.06 SENSITIVE Sensitive     CEFTRIAXONE <=0.12 SENSITIVE Sensitive     * STREPTOCOCCUS PNEUMONIAE  Blood Culture (routine x 2)     Status: None   Collection Time: 09/01/15  3:35 AM  Result Value Ref Range Status   Specimen Description BLOOD LEFT HAND  Final   Special Requests BOTTLES DRAWN AEROBIC ONLY 5CC  Final   Culture  Setup Time   Final    CORRECTED RESULTS GRAM POSITIVE COCCI IN PAIRS CORRECTED RESULTS CALLED TO: ROBBINS,L. ([facility]) AT 1100 ON 09/02/2015 BY BAUGHAM,M. Gram Stain Report Called to,Read Back By and Verified With: PREVIOUSLY TO BEABRAUT,J. AT 1656 ON 08/31/2014 BY BAUGHAM,M. Performed at [facility]    Culture   Final    STREPTOCOCCUS PNEUMONIAE SUSCEPTIBILITIES PERFORMED ON PREVIOUS CULTURE WITHIN THE LAST 5 DAYS. Performed at [facility]    Report Status 09/03/2015 FINAL  Final  Urine culture     Status: None   Collection Time: 09/01/15  4:00  AM  Result Value Ref Range Status   Specimen Description URINE, CATHETERIZED  Final   Special Requests NONE  Final   Culture   Final    NO GROWTH 1 DAY Performed at [facility]    Report Status 09/02/2015 FINAL  Final  MRSA PCR Screening     Status: Abnormal   Collection Time: 09/01/15  7:25 AM  Result Value Ref Range Status   MRSA by PCR POSITIVE (A) NEGATIVE Final    Comment:        The GeneXpert MRSA Assay (FDA approved for NASAL specimens only), is one component of a comprehensive MRSA colonization surveillance program. It is not intended to diagnose MRSA infection nor to guide or monitor treatment for MRSA infections. RESULT CALLED TO, READ BACK BY AND VERIFIED WITH: BEABRAUT RN 10:00 09/01/15 ([email or web address])   Culture, blood (Routine X 2) w Reflex to ID Panel     Status: None (Preliminary result)   Collection Time: 09/03/15  2:45 PM  Result Value Ref Range Status   Specimen Description BLOOD LEFT ANTECUBITAL  Final   Special Requests IN PEDIATRIC BOTTLE 2CC  Final   Culture NO GROWTH < 24 HOURS  Final   Report Status PENDING  Incomplete  Culture, blood (Routine X 2) w Reflex to ID Panel     Status: None (Preliminary result)   Collection Time: 09/03/15  2:52 PM  Result Value Ref Range Status   Specimen Description BLOOD LEFT HAND  Final   Special Requests IN PEDIATRIC BOTTLE .5CC  Final   Culture NO GROWTH < 24 HOURS  Final   Report Status PENDING  Incomplete     Studies: Dg Chest 2 View  09/04/2015  CLINICAL DATA:  Cough and shortness of breath for the past month EXAM: CHEST  2 VIEW COMPARISON:  Portable chest x-ray of September 03, 2015 FINDINGS: The right lung is clear. On the left there is persistent interstitial and alveolar opacity throughout the upper lobe. There is hazy increased density in the lower lobe with obscuration of the heart border. There is pleural fluid blunting the posterior costophrenic angle on the left. The bony thorax exhibits no acute  abnormality. IMPRESSION: Persistent infiltrate throughout the left lung worrisome for pneumonia. Improved appearance of the right lung base. No evidence of pulmonary edema. Electronically Signed   By: David  [place] M.D.   On: 09/04/2015 09:17   Ct Chest W Contrast  09/04/2015  CLINICAL DATA:  Pneumonia EXAM: CT  CHEST WITH CONTRAST TECHNIQUE: Multidetector CT imaging of the chest was performed during intravenous contrast administration. CONTRAST:  75mL OMNIPAQUE IOHEXOL 300 MG/ML  SOLN COMPARISON:  09/01/2015 FINDINGS: Mediastinum/Nodes: Right upper paratracheal lymph node 1 cm in short axis, image 6 series 2, previously 1.2 cm. AP window lymph node 1.2 cm in short axis on image 17 series 2, previously indistinct due to lack of IV contrast. Left lower paratracheal lymph node 1.2 cm on image 17 series 2, stable. Coronary, aortic arch, and branch vessel atherosclerotic vascular disease. Mild cardiomegaly. Lungs/Pleura: Small left pleural effusion without definite enhancement of the parietal pleural margin. There is consolidation in the entire left upper lobe, still striking, but with some intervally improved duration. No pulmonary abscess identified. Air bronchograms noted. Mild atelectasis in the right lower lobe along the right hemidiaphragm. I do not see an obstructing lesion in the proximal tracheobronchial tree. Upper abdomen: Unremarkable Musculoskeletal: Mild thoracic spondylosis. IMPRESSION: 1. Improved aeration in the left upper lobe compared to 09/01/2015, although there still striking airspace opacity in the entire left upper lobe, compatible with pneumonia. Reactive adenopathy in the mediastinum. 2. Coronary, aortic arch, and branch vessel atherosclerotic vascular disease. 3. Stable small left pleural effusion. 4. Mild atelectasis in the right lower lobe along the hemidiaphragm. Electronically Signed   By: Gaylyn Rong M.D.   On: 09/04/2015 17:06    Scheduled Meds: . antiseptic oral rinse  7 mL  Mouth Rinse BID  . atorvastatin  20 mg Oral q1800  . buPROPion  150 mg Oral Daily  . cefTRIAXone (ROCEPHIN)  IV  2 g Intravenous Q24H  . clindamycin (CLEOCIN) IV  600 mg Intravenous 3 times per day  . DULoxetine  60 mg Oral Daily  . fluconazole (DIFLUCAN) IV  100 mg Intravenous Q24H  . heparin subcutaneous  5,000 Units Subcutaneous 3 times per day  . mupirocin ointment  1 application Nasal BID  . olopatadine  1 drop Both Eyes BID  . pantoprazole  40 mg Oral Daily   Continuous Infusions: . sodium chloride 100 mL/hr (09/05/15 1205)    Principal Problem:   CAP (community acquired pneumonia) Active Problems:   Sepsis (HCC)   Respiratory failure, acute (HCC)    Time spent: 35 min    Paulanthony Gleaves  [facility] Pager [phone]. If 7PM-7AM, please contact night-coverage at [email or web address], password [facility] 09/05/2015, 12:44 PM  LOS: 4 days

## 2015-09-05 NOTE — Progress Notes (Signed)
Report attempted. Patient not yet assigned. Will get a call back

## 2015-09-05 NOTE — Progress Notes (Signed)
Patient transferred to 2H19. Attempted to call friend listed in the chart to let her know patient would be transferred, per patient request. No response. Will attempt again.

## 2015-09-05 NOTE — Progress Notes (Signed)
Elink MD notified of current ABG

## 2015-09-05 NOTE — Progress Notes (Signed)
CPT held at this time due to Respiratory Status.

## 2015-09-05 NOTE — Progress Notes (Addendum)
CPT held at this time due to patient sleeping on BiPAP.

## 2015-09-06 ENCOUNTER — Inpatient Hospital Stay (HOSPITAL_COMMUNITY): Payer: Medicare Other

## 2015-09-06 DIAGNOSIS — J9602 Acute respiratory failure with hypercapnia: Secondary | ICD-10-CM

## 2015-09-06 DIAGNOSIS — J9601 Acute respiratory failure with hypoxia: Secondary | ICD-10-CM | POA: Insufficient documentation

## 2015-09-06 LAB — BASIC METABOLIC PANEL
Anion gap: 8 (ref 5–15)
BUN: 9 mg/dL (ref 6–20)
CALCIUM: 7.9 mg/dL — AB (ref 8.9–10.3)
CHLORIDE: 105 mmol/L (ref 101–111)
CO2: 26 mmol/L (ref 22–32)
CREATININE: 0.6 mg/dL (ref 0.44–1.00)
GFR calc Af Amer: >60 mL/min (ref >60)
GFR calc non Af Amer: >60 mL/min (ref >60)
GLUCOSE: 115 mg/dL — AB (ref 65–99)
Potassium: 4.6 mmol/L (ref 3.5–5.1)
Sodium: 139 mmol/L (ref 135–145)

## 2015-09-06 LAB — CBC
HCT: 35.5 % — ABNORMAL LOW (ref 36.0–46.0)
Hemoglobin: 10.7 g/dL — ABNORMAL LOW (ref 12.0–15.0)
MCH: 28.3 pg (ref 26.0–34.0)
MCHC: 30.1 g/dL (ref 30.0–36.0)
MCV: 93.9 fL (ref 78.0–100.0)
PLATELETS: 184 10*3/uL (ref 150–400)
RBC: 3.78 MIL/uL — ABNORMAL LOW (ref 3.87–5.11)
RDW: 16.4 % — AB (ref 11.5–15.5)
WBC: 16.6 10*3/uL — ABNORMAL HIGH (ref 4.0–10.5)

## 2015-09-06 MED ORDER — NICOTINE 7 MG/24HR TD PT24
7.0000 mg | MEDICATED_PATCH | Freq: Every day | TRANSDERMAL | Status: DC
  Administered 2015-09-06 – 2015-09-10 (×5): 7 mg via TRANSDERMAL
  Filled 2015-09-06 (×5): qty 1

## 2015-09-06 MED ORDER — FLUCONAZOLE IN SODIUM CHLORIDE 100-0.9 MG/50ML-% IV SOLN: 100.0000 mg | INTRAVENOUS | Status: DC

## 2015-09-06 MED ORDER — OXYCODONE HCL 5 MG PO TABS
5.0000 mg | ORAL_TABLET | ORAL | Status: DC | PRN
  Administered 2015-09-06 – 2015-09-10 (×16): 5 mg via ORAL
  Filled 2015-09-06 (×17): qty 1

## 2015-09-06 MED ORDER — CYCLOBENZAPRINE HCL 5 MG PO TABS
5.0000 mg | ORAL_TABLET | Freq: Three times a day (TID) | ORAL | Status: DC | PRN
  Administered 2015-09-06 – 2015-09-10 (×9): 5 mg via ORAL
  Filled 2015-09-06 (×9): qty 1

## 2015-09-06 NOTE — Progress Notes (Signed)
Patient in no immediate need for Bipap.  Denies SOB.  RR and SpO2 stable.  RT will continue to monitor.

## 2015-09-06 NOTE — Progress Notes (Signed)
PULMONARY / CRITICAL CARE MEDICINE   Name: Bethany Villa MRN: [id number] DOB: 16-Dec-1955    ADMISSION DATE:  09/01/2015 CONSULTATION DATE:  09/01/15  REFERRING MD:  Dr. Bebe Shaggy / [facility]   CHIEF COMPLAINT:  SOB   HISTORY OF PRESENT ILLNESS:   60 y/o F with PMH of DJD, GERD, HLD, fibromyalgia, chronic pain (followed at a pain clinic), anxiety / depression, and O2 dependent COPD who presented to [facility] on 09/01/15 with complaints of SOB.   The patient reported a two week history of shortness of breath, non-productive cough, and wheezing.  She attempted home nebulized albuterol without relief of symptoms.  The patient reports she had to move around Thanksgiving into a new home and feels she became sick to to "how cold it stays in the house".  She has noted 30+ lb weight loss over the last month.  Last office visit note prior to 07/2015 was in 09/2014 where her weight was recorded at 215 lbs.  She denies night sweats but endorses decreased appetite.  On presentation, she reported 2 wks of SOB and dark urine.  Initial labs notable for Na 134, K 3.0, Cl 96, CK 28, troponin 0.03, WBC 17.3, Hgb 14.9, and platelets 139.  UA negative.  CXR evaluation demonstrated near opacification of the left hemithorax.  [facility] was unable to evaluate for thoracentesis and she was transferred to [facility] for further evaluation.   At [facility] further evaluation revealed that she did not have a pleural effusion but dense consolidation in the left lung. Transferred out of the ICU. She continued to do well with improvement in aeration on the left lung. However she is transferred back to the stepdown today with increasing lethargy, hypercarbia on BiPAP. PCCM asked to consult again.  SUBJECTIVE: Off BiPAP this am , awake, alert, oriented, no distress  VITAL SIGNS: BP 132/86 mmHg  Pulse 94  Temp(Src) 98.3 F (36.8 C) (Oral)  Resp 18  Ht 5\' 4"  (1.626 m)  Wt 87.6 kg (193 lb 2 oz)  BMI 33.13 kg/m2  SpO2 92%  HEMODYNAMICS:     VENTILATOR SETTINGS: Vent Mode:  [-]  FiO2 (%):  [30 %-40 %] 40 %  INTAKE / OUTPUT: I/O last 3 completed shifts: In: 4615 [P.O.:600; I.V.:3515; IV Piggyback:500] Out: 1200 [Urine:1200]  PHYSICAL EXAMINATION: General:  Obese female in NAD Neuro:  AAOx4, no focal deficits  HEENT:  Moist. His membrane, no thyromegaly, JVD Cardiovascular:  S1, S2, no murmurs rubs gallops Lungs:  Rhonchi left lung. Abdomen:  Obese, soft, positive bowel sounds Musculoskeletal:  No acute deformities  Skin:  Warm/dry, L foot with fine lacy erythema, blanches, 1+ BLE pitting edema  LABS:  BMET  Recent Labs Lab 09/04/15 0615 09/05/15 1604 09/06/15 0226  NA 138 139 139  K 3.6 5.2* 4.6  CL 105 105 105  CO2 23 26 26   BUN 9 10 9   CREATININE 0.53 0.59 0.60  GLUCOSE 68 89 115*    Electrolytes  Recent Labs Lab 09/04/15 0615 09/04/15 1609 09/05/15 1604 09/06/15 0226  CALCIUM 8.2*  --  7.8* 7.9*  MG  --  1.8  --   --   PHOS  --  5.1*  --   --     CBC  Recent Labs Lab 09/04/15 0615 09/05/15 1604 09/06/15 0226  WBC 21.7* 23.1* 16.6*  HGB 13.6 11.8* 10.7*  HCT 41.9 36.4 35.5*  PLT 169 164 184    Coag's No results for input(s): APTT, INR in the  last 168 hours.  Sepsis Markers  Recent Labs Lab 09/01/15 0335 09/01/15 0826 09/01/15 1510 09/02/15 0225 09/03/15 0405 09/04/15 1203  LATICACIDVEN 2.65* 1.7  --   --   --  1.2  PROCALCITON  --   --  7.28 6.87 4.47  --     ABG  Recent Labs Lab 09/05/15 1420 09/05/15 1716 09/05/15 2125  PHART 7.274* 7.271* 7.312*  PCO2ART 61.9* 62.4* 53.5*  PO2ART 100 105.0* 89.8    Liver Enzymes  Recent Labs Lab 09/03/15 0405  AST 62*  ALT 30  ALKPHOS 363*  BILITOT 4.0*  ALBUMIN 1.5*    Cardiac Enzymes  Recent Labs Lab 09/01/15 0319  TROPONINI <0.03    Glucose  Recent Labs Lab 09/01/15 0642 09/01/15 0810  GLUCAP 87 82    Imaging Dg Chest Port 1 View  09/06/2015  CLINICAL DATA:  Acute respiratory failure EXAM:  PORTABLE CHEST 1 VIEW COMPARISON:  09/04/2015 chest radiograph. FINDINGS: Stable cardiomediastinal silhouette with normal heart size. No pneumothorax. No pleural effusion. Severe patchy consolidation throughout the left lung, which appears slightly worsened at the left lung base. No acute consolidative airspace disease in the right lung. IMPRESSION: Severe patchy consolidation throughout the left lung, with slight interval worsening at the left lung base, most in keeping with progressive severe pneumonia. Electronically Signed   By: Delbert PhenixJason A Poff M.D.   On: 09/06/2015 07:29     STUDIES:  1/02  L Pleural Assessment >> small amount pleural fluid, first image would consider diagnostic thora only, second more lateral appears to have larger pocket, see images above 1/3 ct chest>>>dense necrotizing PNA 1/5 >>  improved aeration   CULTURES: UA 1/2 >> negative Blood culture 1/2 >> strep pneumonia   ANTIBIOTICS:  PCN/Sulfa Allergy Vanco 1/2 >> 1/4 Levaquin 1/2 >> 1/4 clinda 1/3>>> 1/5 Ceftiaxone 1/4 >> Fluconazole 1/5 >>  SIGNIFICANT EVENTS: 1/02  Admit from [facility] with SOB, weight loss, opacification of L hemithorax   LINES/TUBES:   DISCUSSION: 60 y/o smoker with a PMH of O2 dependent COPD, anxiety/depression/fibromyalgia admitted 1/2 with a 2 wk hx of SOB, found to have opacification of the left pneumonia.  [facility].  Initial presentation with elevated Lactic acid cleared with volume resuscitation.    She has dense opacification of the left lung with no pleural effusions, Streptococcus pneumonia bacteremia.  Repeat CT scan 1/5 shows improving aeration.  Transferred to the stepdown 1/6  with hypercarbic respiratory failure requiring BiPAP   Reccs: - Change BiPAP to prn  - Start Solumedrol 40 mg IV q24 as this has been shown to reduce complications of severe pneumonia - Continue ceftriaxone  Oretha MilchALVA,RAKESH V. MD  09/06/2015, 1:29 PM

## 2015-09-06 NOTE — Progress Notes (Signed)
[facility] PROGRESS NOTE  SHANAIA SIEVERS ZOX:[id number] DOB: 22-Jan-1956 DOA: 09/01/2015 PCP: Bennie Pierini, FNP  Assessment/Plan: 1. Community-acquired pneumonia. -Mrs. Scadden presenting with a one-month history of worsening cough associated  shortness of breath, found to have near opacification of left lung. -She was transferred to [facility] to be evaluated by pulmonary critical care medicine.  -She was started initially on broad-spectrum IV antimicrobial therapy with vancomycin, Levaquin, Flagyl. -Blood cultures are growing Streptococcus pneumonia, organism susceptible to cephalosporins. -Antimicrobials narrowed  to ceftriaxone 2 g IV every 24 hours on 09/04/2015 -On 09/04/2015 lab showing an upward trend in her white count to 21,700 from 13,800. Lactic acid within normal limits at 1.2. Her procalcitonin had trended down to 4.47 on 09/03/2015. She continues to appear ill, however, and with her elevated white  -Repeat CT scan of lungs with IV contrast performed on 09/04/2015 showing improved aeration of left upper lobe compared to prior study although continues to have findings consistent with pneumonia. Study did not reveal evidence of loculation. -Repeat blood cultures from 09/03/2015 showing no growth -Pulmonary Medicine following.  -She was on BiPAP overnight, last ABG showing pH of 7.3 and PCO2 53.5  -AM labs on 09/06/2015 showing white count trending down to 16.6 from 23.1  2.  Sepsis  -Present on admission, evidenced by a white count of 17,300, lactic acid of 2.65, respiratory rate of 28, heart rate of 118, having positive blood cultures for Streptococcus pneumoniae. -Source of infection likely community-acquired pneumonia. -Patient showing gradual improvement over the past 24 hours. -Repeat x-ray showing slight improvement to left-sided infiltrates. -Susceptibility testing from blood cultures reviewed, organism is susceptible to cephalosporins. Narrowed antibiotic  regimen to ceftriaxone 2 g IV every 24 hours and discontinued vancomycin, clindamycin, Flagyl. -Repeat blood cultures drawn on 09/03/2015 showing no growth to date.  3.  Acute hypoxemic hypercarbic respiratory failure -Present on admission, evidenced by a she presented in respiratory distress, had a respiratory rate of 40 and required BiPAP.  -Likely secondary to community-acquired pneumonia as x-ray revealed large left-sided infiltrate. -Transthoracic echocardiogram did not reveal evidence for CHF. -Showing slow clinical improvement -BiPAP changed to PRN  4.  Dyslipidemia. -Continue statin therapy  5.  Hypokalemia. -Potassium levels improved to 3.7 after receiving replacement.  Code Status: Full code Family Communication: Family not present, called Friend who was listed on emergency contact on epic  Disposition Plan: Plan to continue close monitoring in the SDU   Consultants:  Pulmonary critical care medicine  Antibiotics:  Ceftriaxone 12 g IV every 24 hours (started on 09/03/2015)  Vancomycin stopped on 09/03/2015  Clindamycin stopped on 09/03/2015  Flagyl stopped on 09/03/2015  HPI/Subjective: Mrs. Berti is a pleasant 60 year old female with a past medical history of dyslipidemia, initially admitted by the pulmonary critical care service on 09/02/2015 when she presented with shortness of breath associate with cough and wheezing. She reported that she had been sick since Thanksgiving. She had a chest x-ray performed on 09/01/2015 that revealed complete opacification of the left hemithorax. She was started on broad-spectrum IV antimicrobial therapy with vancomycin, clindamycin, Flagyl. Repeat chest x-ray on 09/03/2015 showing slight improvement to left lung infiltrate. Blood cultures drawn on 09/01/2015 groing Streptococcus pneumoniae x 2. Organism susceptible to ceftriaxone.  Objective: Filed Vitals:   09/06/15 0823 09/06/15 1200  BP: 132/86   Pulse: 87 94  Temp: 97.3 F  (36.3 C) 98.3 F (36.8 C)  Resp: 21 18    Intake/Output Summary (Last 24 hours) at 09/06/15 1349  Last data filed at 09/06/15 1011  Gross per 24 hour  Intake 2908.33 ml  Output    850 ml  Net 2058.33 ml   Filed Weights   09/01/15 0248 09/01/15 0642 09/02/15 0300  Weight: 87.091 kg (192 lb) 86.2 kg (190 lb 0.6 oz) 87.6 kg (193 lb 2 oz)    Exam:   General:  On BiPAP, awake and alert   Cardiovascular: Regular rate and rhythm normal S1-S2 no murmurs rubs or gallops  Respiratory: Diminished breath sounds to left lung. There is rhonchi and crackles associate with left lung, no significant change from yesterday.   Abdomen: Soft nontender nondistended  Musculoskeletal: No edema  Data Reviewed: Basic Metabolic Panel:  Recent Labs Lab 09/02/15 0225 09/03/15 0405 09/04/15 0615 09/04/15 1609 09/05/15 1604 09/06/15 0226  NA 138 135 138  --  139 139  K 3.4* 3.7 3.6  --  5.2* 4.6  CL 104 104 105  --  105 105  CO2 25 25 23   --  26 26  GLUCOSE 103* 100* 68  --  89 115*  BUN 14 9 9   --  10 9  CREATININE 0.68 0.70 0.53  --  0.59 0.60  CALCIUM 7.8* 8.2* 8.2*  --  7.8* 7.9*  MG  --   --   --  1.8  --   --   PHOS  --   --   --  5.1*  --   --    Liver Function Tests:  Recent Labs Lab 09/03/15 0405  AST 62*  ALT 30  ALKPHOS 363*  BILITOT 4.0*  PROT 4.9*  ALBUMIN 1.5*   No results for input(s): LIPASE, AMYLASE in the last 168 hours. No results for input(s): AMMONIA in the last 168 hours. CBC:  Recent Labs Lab 09/01/15 0319 09/02/15 0225 09/03/15 0405 09/04/15 0615 09/05/15 1604 09/06/15 0226  WBC 17.3* 10.7* 13.8* 21.7* 23.1* 16.6*  NEUTROABS 15.8*  --  11.5*  --   --   --   HGB 14.9 12.2 12.8 13.6 11.8* 10.7*  HCT 44.0 38.6 40.6 41.9 36.4 35.5*  MCV 88.9 90.2 90.2 90.1 92.2 93.9  PLT 139* 138* 152 169 164 184   Cardiac Enzymes:  Recent Labs Lab 09/01/15 0319  CKTOTAL 28*  TROPONINI <0.03   BNP (last 3 results)  Recent Labs  09/04/15 1609  BNP  252.8*    ProBNP (last 3 results) No results for input(s): PROBNP in the last 8760 hours.  CBG:  Recent Labs Lab 09/01/15 0642 09/01/15 0810  GLUCAP 87 82    Recent Results (from the past 240 hour(s))  Blood Culture (routine x 2)     Status: None   Collection Time: 09/01/15  3:19 AM  Result Value Ref Range Status   Specimen Description BLOOD RIGHT HAND  Final   Special Requests BOTTLES DRAWN AEROBIC AND ANAEROBIC 6CC  Final   Culture  Setup Time   Final    GRAM POSITIVE COCCI IN PAIRS RECOVERED FROM BOTH BOTTLES Gram Stain Report Called to,Read Back By and Verified With: BEABRAUT,J. AT 1656 ON 09/01/2015 BY BAUGHAM,M. Performed at [facility]    Culture   Final    STREPTOCOCCUS PNEUMONIAE Performed at [facility]    Report Status 09/03/2015 FINAL  Final   Organism ID, Bacteria STREPTOCOCCUS PNEUMONIAE  Final      Susceptibility   Streptococcus pneumoniae - MIC*    ERYTHROMYCIN <=0.12 SENSITIVE Sensitive     LEVOFLOXACIN  1 SENSITIVE Sensitive     VANCOMYCIN 0.5 SENSITIVE Sensitive     PENICILLIN <=0.06 SENSITIVE Sensitive     CEFTRIAXONE <=0.12 SENSITIVE Sensitive     * STREPTOCOCCUS PNEUMONIAE  Blood Culture (routine x 2)     Status: None   Collection Time: 09/01/15  3:35 AM  Result Value Ref Range Status   Specimen Description BLOOD LEFT HAND  Final   Special Requests BOTTLES DRAWN AEROBIC ONLY 5CC  Final   Culture  Setup Time   Final    CORRECTED RESULTS GRAM POSITIVE COCCI IN PAIRS CORRECTED RESULTS CALLED TO: ROBBINS,L. ([facility]) AT 1100 ON 09/02/2015 BY BAUGHAM,M. Gram Stain Report Called to,Read Back By and Verified With: PREVIOUSLY TO BEABRAUT,J. AT 1656 ON 08/31/2014 BY BAUGHAM,M. Performed at [facility]    Culture   Final    STREPTOCOCCUS PNEUMONIAE SUSCEPTIBILITIES PERFORMED ON PREVIOUS CULTURE WITHIN THE LAST 5 DAYS. Performed at [facility]    Report Status 09/03/2015 FINAL  Final  Urine culture     Status: None    Collection Time: 09/01/15  4:00 AM  Result Value Ref Range Status   Specimen Description URINE, CATHETERIZED  Final   Special Requests NONE  Final   Culture   Final    NO GROWTH 1 DAY Performed at [facility]    Report Status 09/02/2015 FINAL  Final  MRSA PCR Screening     Status: Abnormal   Collection Time: 09/01/15  7:25 AM  Result Value Ref Range Status   MRSA by PCR POSITIVE (A) NEGATIVE Final    Comment:        The GeneXpert MRSA Assay (FDA approved for NASAL specimens only), is one component of a comprehensive MRSA colonization surveillance program. It is not intended to diagnose MRSA infection nor to guide or monitor treatment for MRSA infections. RESULT CALLED TO, READ BACK BY AND VERIFIED WITH: BEABRAUT RN 10:00 09/01/15 (wilsonm)   Culture, blood (Routine X 2) w Reflex to ID Panel     Status: None (Preliminary result)   Collection Time: 09/03/15  2:45 PM  Result Value Ref Range Status   Specimen Description BLOOD LEFT ANTECUBITAL  Final   Special Requests IN PEDIATRIC BOTTLE 2CC  Final   Culture NO GROWTH 3 DAYS  Final   Report Status PENDING  Incomplete  Culture, blood (Routine X 2) w Reflex to ID Panel     Status: None (Preliminary result)   Collection Time: 09/03/15  2:52 PM  Result Value Ref Range Status   Specimen Description BLOOD LEFT HAND  Final   Special Requests IN PEDIATRIC BOTTLE .5CC  Final   Culture NO GROWTH 3 DAYS  Final   Report Status PENDING  Incomplete     Studies: Ct Chest W Contrast  09/04/2015  CLINICAL DATA:  Pneumonia EXAM: CT CHEST WITH CONTRAST TECHNIQUE: Multidetector CT imaging of the chest was performed during intravenous contrast administration. CONTRAST:  75mL OMNIPAQUE IOHEXOL 300 MG/ML  SOLN COMPARISON:  09/01/2015 FINDINGS: Mediastinum/Nodes: Right upper paratracheal lymph node 1 cm in short axis, image 6 series 2, previously 1.2 cm. AP window lymph node 1.2 cm in short axis on image 17 series 2, previously indistinct due  to lack of IV contrast. Left lower paratracheal lymph node 1.2 cm on image 17 series 2, stable. Coronary, aortic arch, and branch vessel atherosclerotic vascular disease. Mild cardiomegaly. Lungs/Pleura: Small left pleural effusion without definite enhancement of the parietal pleural margin. There is consolidation in the entire  left upper lobe, still striking, but with some intervally improved duration. No pulmonary abscess identified. Air bronchograms noted. Mild atelectasis in the right lower lobe along the right hemidiaphragm. I do not see an obstructing lesion in the proximal tracheobronchial tree. Upper abdomen: Unremarkable Musculoskeletal: Mild thoracic spondylosis. IMPRESSION: 1. Improved aeration in the left upper lobe compared to 09/01/2015, although there still striking airspace opacity in the entire left upper lobe, compatible with pneumonia. Reactive adenopathy in the mediastinum. 2. Coronary, aortic arch, and branch vessel atherosclerotic vascular disease. 3. Stable small left pleural effusion. 4. Mild atelectasis in the right lower lobe along the hemidiaphragm. Electronically Signed   By: Gaylyn RongWalter  Liebkemann M.D.   On: 09/04/2015 17:06   Dg Chest Port 1 View  09/06/2015  CLINICAL DATA:  Acute respiratory failure EXAM: PORTABLE CHEST 1 VIEW COMPARISON:  09/04/2015 chest radiograph. FINDINGS: Stable cardiomediastinal silhouette with normal heart size. No pneumothorax. No pleural effusion. Severe patchy consolidation throughout the left lung, which appears slightly worsened at the left lung base. No acute consolidative airspace disease in the right lung. IMPRESSION: Severe patchy consolidation throughout the left lung, with slight interval worsening at the left lung base, most in keeping with progressive severe pneumonia. Electronically Signed   By: Delbert PhenixJason A Poff M.D.   On: 09/06/2015 07:29    Scheduled Meds: . antiseptic oral rinse  7 mL Mouth Rinse BID  . atorvastatin  20 mg Oral q1800  . buPROPion   150 mg Oral Daily  . cefTRIAXone (ROCEPHIN)  IV  2 g Intravenous Q24H  . clindamycin (CLEOCIN) IV  600 mg Intravenous 3 times per day  . DULoxetine  60 mg Oral Daily  . fluconazole (DIFLUCAN) IV  100 mg Intravenous Q24H  . heparin subcutaneous  5,000 Units Subcutaneous 3 times per day  . methylPREDNISolone (SOLU-MEDROL) injection  40 mg Intravenous Q12H  . nicotine  7 mg Transdermal Daily  . olopatadine  1 drop Both Eyes BID  . pantoprazole  40 mg Oral Daily   Continuous Infusions:    Principal Problem:   CAP (community acquired pneumonia) Active Problems:   Sepsis (HCC)   Respiratory failure, acute (HCC)    Time spent: 30 min    Jeralyn BennettZAMORA, [place]  [facility] Pager [phone]. If 7PM-7AM, please contact night-coverage at [email or web address], password [email or web address] 09/06/2015, 1:49 PM  LOS: 5 days

## 2015-09-07 MED ORDER — GUAIFENESIN-DM 100-10 MG/5ML PO SYRP
5.0000 mL | ORAL_SOLUTION | ORAL | Status: DC | PRN
  Administered 2015-09-07 (×2): 5 mL via ORAL
  Filled 2015-09-07 (×2): qty 5

## 2015-09-07 NOTE — Progress Notes (Signed)
Report called to [facility] RN. Pt made aware of move. Belongings all gathered to be sent with pt.

## 2015-09-07 NOTE — Progress Notes (Signed)
Refuses bipap

## 2015-09-07 NOTE — Progress Notes (Signed)
[facility] PROGRESS NOTE  TIFFINY WORTHY ZOX:[id number] DOB: [birth year] DOA: 09/01/2015 PCP: Bennie Pierini, FNP  Assessment/Plan: 1. Community-acquired pneumonia. -Mrs. Maloof presenting with a one-month history of worsening cough associated  shortness of breath, found to have near opacification of left lung. -She was transferred to [facility] to be evaluated by pulmonary critical care medicine.  -She was started initially on broad-spectrum IV antimicrobial therapy with vancomycin, Levaquin, Flagyl. -Blood cultures are growing Streptococcus pneumonia, organism susceptible to cephalosporins. -Antimicrobials narrowed  to ceftriaxone 2 g IV every 24 hours on 09/04/2015 -On 09/04/2015 lab showing an upward trend in her white count to 21,700 from 13,800. Lactic acid within normal limits at 1.2. Her procalcitonin had trended down to 4.47 on 09/03/2015. She continues to appear ill, however, and with her elevated white  -Repeat CT scan of lungs with IV contrast performed on 09/04/2015 showing improved aeration of left upper lobe compared to prior study although continues to have findings consistent with pneumonia. Study did not reveal evidence of loculation. -Repeat blood cultures from 09/03/2015 showing no growth -Pulmonary Medicine following.  -On 09/07/2015 patient showing significant conical improvement. She is awake, alert, interactive, afebrile and nontoxic appearing. Transfer out of stepdown unit to telemetry.  2.  Sepsis  -Present on admission, evidenced by a white count of 17,300, lactic acid of 2.65, respiratory rate of 28, heart rate of 118, having positive blood cultures for Streptococcus pneumoniae. -Source of infection likely community-acquired pneumonia. -Patient showing gradual improvement over the past 24 hours. -Repeat x-ray showing slight improvement to left-sided infiltrates. -Susceptibility testing from blood cultures reviewed, organism is susceptible to  cephalosporins. Narrowed antibiotic regimen to ceftriaxone 2 g IV every 24 hours and discontinued vancomycin, clindamycin, Flagyl. -Repeat blood cultures drawn on 09/03/2015 showing no growth to date.  3.  Acute hypoxemic hypercarbic respiratory failure -Present on admission, evidenced by a she presented in respiratory distress, had a respiratory rate of 40 and required BiPAP.  -Likely secondary to community-acquired pneumonia as x-ray revealed large left-sided infiltrate. -Transthoracic echocardiogram did not reveal evidence for CHF. -Showing slow clinical improvement -BiPAP changed to PRN -By 09/07/2015 she was showing significant clinical improvement, satting mid 90s on 2 L supplemental oxygen.  4.  Dyslipidemia. -Continue statin therapy  5.  Hypokalemia. -Potassium levels improved to 3.7 after receiving replacement.  Code Status: Full code Family Communication: Family not present, called Friend who was listed on emergency contact on epic  Disposition Plan: Plan to continue close monitoring in the SDU   Consultants:  Pulmonary critical care medicine  Antibiotics:  Ceftriaxone 12 g IV every 24 hours (started on 09/03/2015)  Vancomycin stopped on 09/03/2015  Clindamycin stopped on 09/03/2015  Flagyl stopped on 09/03/2015  HPI/Subjective: Mrs. Grajeda is a pleasant 60 year old female with a past medical history of dyslipidemia, initially admitted by the pulmonary critical care service on 09/02/2015 when she presented with shortness of breath associate with cough and wheezing. She reported that she had been sick since Thanksgiving. She had a chest x-ray performed on 09/01/2015 that revealed complete opacification of the left hemithorax. She was started on broad-spectrum IV antimicrobial therapy with vancomycin, clindamycin, Flagyl. Repeat chest x-ray on 09/03/2015 showing slight improvement to left lung infiltrate. Blood cultures drawn on 09/01/2015 groing Streptococcus pneumoniae  x 2. Organism susceptible to ceftriaxone.  Objective: Filed Vitals:   09/07/15 0729 09/07/15 0800  BP: 131/76   Pulse: 82 82  Temp: 97.6 F (36.4 C)   Resp: 17 23  Intake/Output Summary (Last 24 hours) at 09/07/15 0920 Last data filed at 09/07/15 0800  Gross per 24 hour  Intake 1168.33 ml  Output    575 ml  Net 593.33 ml   Filed Weights   09/01/15 0248 09/01/15 0642 09/02/15 0300  Weight: 87.091 kg (192 lb) 86.2 kg (190 lb 0.6 oz) 87.6 kg (193 lb 2 oz)    Exam:   General:  Now off of BiPAP, awake and alert, states feeling much better today.  Cardiovascular: Regular rate and rhythm normal S1-S2 no murmurs rubs or gallops  Respiratory: Diminished breath sounds to left lung. There is rhonchi and crackles associate with left lung  Abdomen: Soft nontender nondistended  Musculoskeletal: No edema  Data Reviewed: Basic Metabolic Panel:  Recent Labs Lab 09/02/15 0225 09/03/15 0405 09/04/15 0615 09/04/15 1609 09/05/15 1604 09/06/15 0226  NA 138 135 138  --  139 139  K 3.4* 3.7 3.6  --  5.2* 4.6  CL 104 104 105  --  105 105  CO2 25 25 23   --  26 26  GLUCOSE 103* 100* 68  --  89 115*  BUN 14 9 9   --  10 9  CREATININE 0.68 0.70 0.53  --  0.59 0.60  CALCIUM 7.8* 8.2* 8.2*  --  7.8* 7.9*  MG  --   --   --  1.8  --   --   PHOS  --   --   --  5.1*  --   --    Liver Function Tests:  Recent Labs Lab 09/03/15 0405  AST 62*  ALT 30  ALKPHOS 363*  BILITOT 4.0*  PROT 4.9*  ALBUMIN 1.5*   No results for input(s): LIPASE, AMYLASE in the last 168 hours. No results for input(s): AMMONIA in the last 168 hours. CBC:  Recent Labs Lab 09/01/15 0319 09/02/15 0225 09/03/15 0405 09/04/15 0615 09/05/15 1604 09/06/15 0226  WBC 17.3* 10.7* 13.8* 21.7* 23.1* 16.6*  NEUTROABS 15.8*  --  11.5*  --   --   --   HGB 14.9 12.2 12.8 13.6 11.8* 10.7*  HCT 44.0 38.6 40.6 41.9 36.4 35.5*  MCV 88.9 90.2 90.2 90.1 92.2 93.9  PLT 139* 138* 152 169 164 184   Cardiac  Enzymes:  Recent Labs Lab 09/01/15 0319  CKTOTAL 28*  TROPONINI <0.03   BNP (last 3 results)  Recent Labs  09/04/15 1609  BNP 252.8*    ProBNP (last 3 results) No results for input(s): PROBNP in the last 8760 hours.  CBG:  Recent Labs Lab 09/01/15 0642 09/01/15 0810  GLUCAP 87 82    Recent Results (from the past 240 hour(s))  Blood Culture (routine x 2)     Status: None   Collection Time: 09/01/15  3:19 AM  Result Value Ref Range Status   Specimen Description BLOOD RIGHT HAND  Final   Special Requests BOTTLES DRAWN AEROBIC AND ANAEROBIC 6CC  Final   Culture  Setup Time   Final    GRAM POSITIVE COCCI IN PAIRS RECOVERED FROM BOTH BOTTLES Gram Stain Report Called to,Read Back By and Verified With: BEABRAUT,J. AT 1656 ON 09/01/2015 BY BAUGHAM,M. [facility]    Culture   Final    STREPTOCOCCUS PNEUMONIAE [facility]    Report Status 09/03/2015 FINAL  Final   Organism ID, Bacteria STREPTOCOCCUS PNEUMONIAE  Final      Susceptibility   Streptococcus pneumoniae - MIC*    ERYTHROMYCIN <=  0.12 SENSITIVE Sensitive     LEVOFLOXACIN 1 SENSITIVE Sensitive     VANCOMYCIN 0.5 SENSITIVE Sensitive     PENICILLIN <=0.06 SENSITIVE Sensitive     CEFTRIAXONE <=0.12 SENSITIVE Sensitive     * STREPTOCOCCUS PNEUMONIAE  Blood Culture (routine x 2)     Status: None   Collection Time: 09/01/15  3:35 AM  Result Value Ref Range Status   Specimen Description BLOOD LEFT HAND  Final   Special Requests BOTTLES DRAWN AEROBIC ONLY 5CC  Final   Culture  Setup Time   Final    CORRECTED RESULTS GRAM POSITIVE COCCI IN PAIRS CORRECTED RESULTS CALLED TO: ROBBINS,L. ([facility]) AT 1100 ON 09/02/2015 BY BAUGHAM,M. Gram Stain Report Called to,Read Back By and Verified With: PREVIOUSLY TO BEABRAUT,J. AT 1656 ON 08/31/2014 BY BAUGHAM,M. Performed at [facility]    Culture   Final    STREPTOCOCCUS PNEUMONIAE SUSCEPTIBILITIES PERFORMED ON PREVIOUS CULTURE  WITHIN THE LAST 5 DAYS. Performed at [facility]    Report Status 09/03/2015 FINAL  Final  Urine culture     Status: None   Collection Time: 09/01/15  4:00 AM  Result Value Ref Range Status   Specimen Description URINE, CATHETERIZED  Final   Special Requests NONE  Final   Culture   Final    NO GROWTH 1 DAY Performed at [facility]    Report Status 09/02/2015 FINAL  Final  MRSA PCR Screening     Status: Abnormal   Collection Time: 09/01/15  7:25 AM  Result Value Ref Range Status   MRSA by PCR POSITIVE (A) NEGATIVE Final    Comment:        The GeneXpert MRSA Assay (FDA approved for NASAL specimens only), is one component of a comprehensive MRSA colonization surveillance program. It is not intended to diagnose MRSA infection nor to guide or monitor treatment for MRSA infections. RESULT CALLED TO, READ BACK BY AND VERIFIED WITH: BEABRAUT RN 10:00 09/01/15 (wilsonm)   Culture, blood (Routine X 2) w Reflex to ID Panel     Status: None (Preliminary result)   Collection Time: 09/03/15  2:45 PM  Result Value Ref Range Status   Specimen Description BLOOD LEFT ANTECUBITAL  Final   Special Requests IN PEDIATRIC BOTTLE 2CC  Final   Culture NO GROWTH 3 DAYS  Final   Report Status PENDING  Incomplete  Culture, blood (Routine X 2) w Reflex to ID Panel     Status: None (Preliminary result)   Collection Time: 09/03/15  2:52 PM  Result Value Ref Range Status   Specimen Description BLOOD LEFT HAND  Final   Special Requests IN PEDIATRIC BOTTLE .5CC  Final   Culture NO GROWTH 3 DAYS  Final   Report Status PENDING  Incomplete     Studies: Dg Chest Port 1 View  09/06/2015  CLINICAL DATA:  Acute respiratory failure EXAM: PORTABLE CHEST 1 VIEW COMPARISON:  09/04/2015 chest radiograph. FINDINGS: Stable cardiomediastinal silhouette with normal heart size. No pneumothorax. No pleural effusion. Severe patchy consolidation throughout the left lung, which appears slightly worsened at  the left lung base. No acute consolidative airspace disease in the right lung. IMPRESSION: Severe patchy consolidation throughout the left lung, with slight interval worsening at the left lung base, most in keeping with progressive severe pneumonia. Electronically Signed   By: Delbert Phenix M.D.   On: 09/06/2015 07:29    Scheduled Meds: . antiseptic oral rinse  7 mL Mouth Rinse BID  .  atorvastatin  20 mg Oral q1800  . buPROPion  150 mg Oral Daily  . cefTRIAXone (ROCEPHIN)  IV  2 g Intravenous Q24H  . clindamycin (CLEOCIN) IV  600 mg Intravenous 3 times per day  . DULoxetine  60 mg Oral Daily  . fluconazole (DIFLUCAN) IV  100 mg Intravenous Q24H  . heparin subcutaneous  5,000 Units Subcutaneous 3 times per day  . methylPREDNISolone (SOLU-MEDROL) injection  40 mg Intravenous Q12H  . nicotine  7 mg Transdermal Daily  . olopatadine  1 drop Both Eyes BID  . pantoprazole  40 mg Oral Daily   Continuous Infusions:    Principal Problem:   CAP (community acquired pneumonia) Active Problems:   Sepsis (HCC)   Respiratory failure, acute (HCC)   Acute respiratory failure with hypoxia (HCC)    Time spent: 25 min    Jeralyn Bennett  [facility] Pager [phone]. If 7PM-7AM, please contact night-coverage at [email or web address], password [facility] 09/07/2015, 9:20 AM  LOS: 6 days

## 2015-09-07 NOTE — Progress Notes (Signed)
Pt request something for cough. Non productive.  md notified. Will continue to monitor. Bethany Villa, Darius Lundberg T

## 2015-09-08 ENCOUNTER — Inpatient Hospital Stay (HOSPITAL_COMMUNITY): Payer: Medicare Other

## 2015-09-08 DIAGNOSIS — J440 Chronic obstructive pulmonary disease with acute lower respiratory infection: Secondary | ICD-10-CM | POA: Insufficient documentation

## 2015-09-08 DIAGNOSIS — J9601 Acute respiratory failure with hypoxia: Secondary | ICD-10-CM

## 2015-09-08 DIAGNOSIS — J9600 Acute respiratory failure, unspecified whether with hypoxia or hypercapnia: Secondary | ICD-10-CM | POA: Insufficient documentation

## 2015-09-08 LAB — BASIC METABOLIC PANEL
Anion gap: 6 (ref 5–15)
BUN: 10 mg/dL (ref 6–20)
CALCIUM: 8.3 mg/dL — AB (ref 8.9–10.3)
CO2: 32 mmol/L (ref 22–32)
CREATININE: 0.61 mg/dL (ref 0.44–1.00)
Chloride: 101 mmol/L (ref 101–111)
GFR calc non Af Amer: >60 mL/min (ref >60)
Glucose, Bld: 90 mg/dL (ref 65–99)
Potassium: 4.4 mmol/L (ref 3.5–5.1)
Sodium: 139 mmol/L (ref 135–145)

## 2015-09-08 LAB — CBC
HEMATOCRIT: 40 % (ref 36.0–46.0)
Hemoglobin: 12.4 g/dL (ref 12.0–15.0)
MCH: 29 pg (ref 26.0–34.0)
MCHC: 31 g/dL (ref 30.0–36.0)
MCV: 93.7 fL (ref 78.0–100.0)
PLATELETS: 281 10*3/uL (ref 150–400)
RBC: 4.27 MIL/uL (ref 3.87–5.11)
RDW: 16.2 % — ABNORMAL HIGH (ref 11.5–15.5)
WBC: 13.4 10*3/uL — ABNORMAL HIGH (ref 4.0–10.5)

## 2015-09-08 LAB — CULTURE, BLOOD (ROUTINE X 2)
CULTURE: NO GROWTH
Culture: NO GROWTH

## 2015-09-08 MED ORDER — PREDNISONE 50 MG PO TABS: 50.0000 mg | ORAL_TABLET | Freq: Every day | ORAL | Status: DC

## 2015-09-08 MED ORDER — LEVOFLOXACIN 750 MG PO TABS
750.0000 mg | ORAL_TABLET | Freq: Every day | ORAL | Status: DC
  Administered 2015-09-08 – 2015-09-10 (×3): 750 mg via ORAL
  Filled 2015-09-08 (×3): qty 1

## 2015-09-08 MED ORDER — PREDNISONE 50 MG PO TABS
50.0000 mg | ORAL_TABLET | Freq: Every day | ORAL | Status: AC
  Administered 2015-09-09: 50 mg via ORAL
  Filled 2015-09-08: qty 1

## 2015-09-08 MED ORDER — PREDNISONE 50 MG PO TABS
60.0000 mg | ORAL_TABLET | Freq: Every day | ORAL | Status: AC
  Administered 2015-09-08: 60 mg via ORAL
  Filled 2015-09-08 (×2): qty 1

## 2015-09-08 NOTE — Progress Notes (Signed)
[facility] PROGRESS NOTE  LEE-ANNE FLICKER ZOX:[id number] DOB: [birth year] DOA: 09/01/2015 PCP: Bennie Pierini, FNP  Assessment/Plan: 1. Community-acquired pneumonia. -Bethany Villa presenting with a one-month history of worsening cough associated  shortness of breath, found to have near opacification of left lung. -She was transferred to [facility] to be evaluated by pulmonary critical care medicine.  -She was started initially on broad-spectrum IV antimicrobial therapy with vancomycin, Levaquin, Flagyl. -Blood cultures are growing Streptococcus pneumonia, organism susceptible to cephalosporins. -Antimicrobials narrowed  to ceftriaxone 2 g IV every 24 hours on 09/04/2015 -On 09/04/2015 lab showing an upward trend in her white count to 21,700 from 13,800. Lactic acid within normal limits at 1.2. Her procalcitonin had trended down to 4.47 on 09/03/2015. She continues to appear ill, however, and with her elevated white  -Repeat CT scan of lungs with IV contrast performed on 09/04/2015 showing improved aeration of left upper lobe compared to prior study although continues to have findings consistent with pneumonia. Study did not reveal evidence of loculation. -Repeat blood cultures from 09/03/2015 showing no growth todate -Pulmonary Medicine following.  -On 09/07/2015 patient showing significant clinical improvement. She is awake, alert, interactive, afebrile and nontoxic appearing. Transfer out of stepdown unit to telemetry. -On my assessment on 09/08/2015 she continues to improve, remains afebrile, encephalopathy resolving. Her white count continues to trend down to 13,400. Tolerating PO. Will transition to Oral Levaquin 750 mg PO q daily and stop IV Rocephin. Plan for total of 14 days of antimicrobial therapy.   2.  Sepsis  -Present on admission, evidenced by a white count of 17,300, lactic acid of 2.65, respiratory rate of 28, heart rate of 118, having positive blood cultures for  Streptococcus pneumoniae. -Source of infection likely community-acquired pneumonia. -Patient showing gradual improvement over the past 24 hours. -Repeat x-ray showing slight improvement to left-sided infiltrates. -Susceptibility testing from blood cultures reviewed, organism is susceptible to cephalosporins. Narrowed antibiotic regimen to ceftriaxone 2 g IV every 24 hours and discontinued vancomycin, clindamycin, Flagyl. -Repeat blood cultures drawn on 09/03/2015 showing no growth to date. -She was transitioned to Levaquin 750 mg PO q daily on 09/08/2015  3.  Acute hypoxemic hypercarbic respiratory failure -Present on admission, evidenced by a she presented in respiratory distress, had a respiratory rate of 40 and required BiPAP.  -Likely secondary to community-acquired pneumonia as x-ray revealed large left-sided infiltrate. -Transthoracic echocardiogram did not reveal evidence for CHF. -Showing slow clinical improvement -BiPAP changed to PRN -By 09/07/2015 she was showing significant clinical improvement, satting mid 90s on 2 L supplemental oxygen.  4.  Dyslipidemia. -Continue statin therapy  5.  Hypokalemia. -Potassium levels improved to 3.7 after receiving replacement.  Code Status: Full code Family Communication: Family not present, called Friend who was listed on emergency contact on epic  Disposition Plan: Transitioning to oral AB's. Will encourage ambulation today.    Consultants:  Pulmonary critical care medicine  Antibiotics:  Ceftriaxone 12 g IV every 24 hours (started on 09/03/2015)  Vancomycin stopped on 09/03/2015  Clindamycin stopped on 09/03/2015  Flagyl stopped on 09/03/2015  Transitoned to Levaquin on 09/08/2015  HPI/Subjective: Bethany Villa is a pleasant 60 year old female with a past medical history of dyslipidemia, initially admitted by the pulmonary critical care service on 09/02/2015 when she presented with shortness of breath associate with cough and  wheezing. She reported that she had been sick since Thanksgiving. She had a chest x-ray performed on 09/01/2015 that revealed complete opacification of the left hemithorax. She was  started on broad-spectrum IV antimicrobial therapy with vancomycin, clindamycin, Flagyl. Repeat chest x-ray on 09/03/2015 showing slight improvement to left lung infiltrate. Blood cultures drawn on 09/01/2015 groing Streptococcus pneumoniae x 2. Organism susceptible to ceftriaxone.  Objective: Filed Vitals:   09/08/15 0011 09/08/15 0531  BP: 110/56 130/92  Pulse: 86 84  Temp: 98.1 F (36.7 C) 98 F (36.7 C)  Resp: 18 20    Intake/Output Summary (Last 24 hours) at 09/08/15 0913 Last data filed at 09/08/15 [id number]  Gross per 24 hour  Intake   1390 ml  Output   1325 ml  Net     65 ml   Filed Weights   09/02/15 0300 09/07/15 1521 09/08/15 0531  Weight: 87.6 kg (193 lb 2 oz) 86.6 kg (190 lb 14.7 oz) 85.911 kg (189 lb 6.4 oz)    Exam:   General:  Looks much better, she is sitting at bedside chair, awake and alert  Cardiovascular: Regular rate and rhythm normal S1-S2 no murmurs rubs or gallops  Respiratory: Significant improvement to lung sounds from left lung, with decreased crackles and rhonchi  Abdomen: Soft nontender nondistended  Musculoskeletal: No edema  Data Reviewed: Basic Metabolic Panel:  Recent Labs Lab 09/03/15 0405 09/04/15 0615 09/04/15 1609 09/05/15 1604 09/06/15 0226 09/08/15 0535  NA 135 138  --  139 139 139  K 3.7 3.6  --  5.2* 4.6 4.4  CL 104 105  --  105 105 101  CO2 25 23  --  26 26 32  GLUCOSE 100* 68  --  89 115* 90  BUN 9 9  --  10 9 10   CREATININE 0.70 0.53  --  0.59 0.60 0.61  CALCIUM 8.2* 8.2*  --  7.8* 7.9* 8.3*  MG  --   --  1.8  --   --   --   PHOS  --   --  5.1*  --   --   --    Liver Function Tests:  Recent Labs Lab 09/03/15 0405  AST 62*  ALT 30  ALKPHOS 363*  BILITOT 4.0*  PROT 4.9*  ALBUMIN 1.5*   No results for input(s): LIPASE, AMYLASE in  the last 168 hours. No results for input(s): AMMONIA in the last 168 hours. CBC:  Recent Labs Lab 09/03/15 0405 09/04/15 0615 09/05/15 1604 09/06/15 0226 09/08/15 0535  WBC 13.8* 21.7* 23.1* 16.6* 13.4*  NEUTROABS 11.5*  --   --   --   --   HGB 12.8 13.6 11.8* 10.7* 12.4  HCT 40.6 41.9 36.4 35.5* 40.0  MCV 90.2 90.1 92.2 93.9 93.7  PLT 152 169 164 184 281   Cardiac Enzymes: No results for input(s): CKTOTAL, CKMB, CKMBINDEX, TROPONINI in the last 168 hours. BNP (last 3 results)  Recent Labs  09/04/15 1609  BNP 252.8*    ProBNP (last 3 results) No results for input(s): PROBNP in the last 8760 hours.  CBG: No results for input(s): GLUCAP in the last 168 hours.  Recent Results (from the past 240 hour(s))  Blood Culture (routine x 2)     Status: None   Collection Time: 09/01/15  3:19 AM  Result Value Ref Range Status   Specimen Description BLOOD RIGHT HAND  Final   Special Requests BOTTLES DRAWN AEROBIC AND ANAEROBIC 6CC  Final   Culture  Setup Time   Final    GRAM POSITIVE COCCI IN PAIRS RECOVERED FROM BOTH BOTTLES Gram Stain Report Called to,Read Back By and Verified With:  BEABRAUT,J. AT 1656 ON 09/01/2015 BY Ginette Pitman. Performed at [facility]    Culture   Final    STREPTOCOCCUS PNEUMONIAE Performed at [facility]    Report Status 09/03/2015 FINAL  Final   Organism ID, Bacteria STREPTOCOCCUS PNEUMONIAE  Final      Susceptibility   Streptococcus pneumoniae - MIC*    ERYTHROMYCIN <=0.12 SENSITIVE Sensitive     LEVOFLOXACIN 1 SENSITIVE Sensitive     VANCOMYCIN 0.5 SENSITIVE Sensitive     PENICILLIN <=0.06 SENSITIVE Sensitive     CEFTRIAXONE <=0.12 SENSITIVE Sensitive     * STREPTOCOCCUS PNEUMONIAE  Blood Culture (routine x 2)     Status: None   Collection Time: 09/01/15  3:35 AM  Result Value Ref Range Status   Specimen Description BLOOD LEFT HAND  Final   Special Requests BOTTLES DRAWN AEROBIC ONLY 5CC  Final   Culture  Setup Time   Final     CORRECTED RESULTS GRAM POSITIVE COCCI IN PAIRS CORRECTED RESULTS CALLED TO: ROBBINS,L. ([facility]) AT 1100 ON 09/02/2015 BY BAUGHAM,M. Gram Stain Report Called to,Read Back By and Verified With: PREVIOUSLY TO BEABRAUT,J. AT 1656 ON 08/31/2014 BY BAUGHAM,M. Performed at [facility]    Culture   Final    STREPTOCOCCUS PNEUMONIAE SUSCEPTIBILITIES PERFORMED ON PREVIOUS CULTURE WITHIN THE LAST 5 DAYS. Performed at [facility]    Report Status 09/03/2015 FINAL  Final  Urine culture     Status: None   Collection Time: 09/01/15  4:00 AM  Result Value Ref Range Status   Specimen Description URINE, CATHETERIZED  Final   Special Requests NONE  Final   Culture   Final    NO GROWTH 1 DAY Performed at [facility]    Report Status 09/02/2015 FINAL  Final  MRSA PCR Screening     Status: Abnormal   Collection Time: 09/01/15  7:25 AM  Result Value Ref Range Status   MRSA by PCR POSITIVE (A) NEGATIVE Final    Comment:        The GeneXpert MRSA Assay (FDA approved for NASAL specimens only), is one component of a comprehensive MRSA colonization surveillance program. It is not intended to diagnose MRSA infection nor to guide or monitor treatment for MRSA infections. RESULT CALLED TO, READ BACK BY AND VERIFIED WITH: BEABRAUT RN 10:00 09/01/15 ([email or web address])   Culture, blood (Routine X 2) w Reflex to ID Panel     Status: None (Preliminary result)   Collection Time: 09/03/15  2:45 PM  Result Value Ref Range Status   Specimen Description BLOOD LEFT ANTECUBITAL  Final   Special Requests IN PEDIATRIC BOTTLE 2CC  Final   Culture NO GROWTH 4 DAYS  Final   Report Status PENDING  Incomplete  Culture, blood (Routine X 2) w Reflex to ID Panel     Status: None (Preliminary result)   Collection Time: 09/03/15  2:52 PM  Result Value Ref Range Status   Specimen Description BLOOD LEFT HAND  Final   Special Requests IN PEDIATRIC BOTTLE .5CC  Final   Culture NO GROWTH 4 DAYS  Final   Report  Status PENDING  Incomplete     Studies: No results found.  Scheduled Meds: . antiseptic oral rinse  7 mL Mouth Rinse BID  . atorvastatin  20 mg Oral q1800  . buPROPion  150 mg Oral Daily  . DULoxetine  60 mg Oral Daily  . fluconazole (DIFLUCAN) IV  100 mg Intravenous Q24H  . heparin subcutaneous  5,000 Units Subcutaneous 3 times per day  . levofloxacin  750 mg Oral Daily  . nicotine  7 mg Transdermal Daily  . olopatadine  1 drop Both Eyes BID  . pantoprazole  40 mg Oral Daily  . [START ON 09/09/2015] predniSONE  50 mg Oral Q breakfast  . predniSONE  60 mg Oral Q breakfast   Continuous Infusions:    Principal Problem:   CAP (community acquired pneumonia) Active Problems:   Sepsis (HCC)   Respiratory failure, acute (HCC)   Acute respiratory failure with hypoxia (HCC)    Time spent: 25 min    Jeralyn Bennett  [facility] Pager [phone]. If 7PM-7AM, please contact night-coverage at [email or web address], password [facility] 09/08/2015, 9:13 AM  LOS: 7 days

## 2015-09-08 NOTE — Progress Notes (Signed)
PULMONARY / CRITICAL CARE MEDICINE   Name: Bethany Villa MRN: [id number] DOB: 1955/11/07    ADMISSION DATE:  09/01/2015 CONSULTATION DATE:  09/01/15  REFERRING MD:  Dr. Bebe Shaggy / [facility]   CHIEF COMPLAINT:  SOB   HISTORY OF PRESENT ILLNESS:   60 y/o F with PMH of DJD, GERD, HLD, fibromyalgia, chronic pain (followed at a pain clinic), anxiety / depression, and O2 dependent COPD who presented to [facility] on 09/01/15 with complaints of SOB.   The patient reported a two week history of shortness of breath, non-productive cough, and wheezing.  She attempted home nebulized albuterol without relief of symptoms.  The patient reports she had to move around Thanksgiving into a new home and feels she became sick to to "how cold it stays in the house".  She has noted 30+ lb weight loss over the last month.  Last office visit note prior to 07/2015 was in 09/2014 where her weight was recorded at 215 lbs.  She denies night sweats but endorses decreased appetite.  On presentation, she reported 2 wks of SOB and dark urine.  Initial labs notable for Na 134, K 3.0, Cl 96, CK 28, troponin 0.03, WBC 17.3, Hgb 14.9, and platelets 139.  UA negative.  CXR evaluation demonstrated near opacification of the left hemithorax.  [facility] was unable to evaluate for thoracentesis and she was transferred to [facility] for further evaluation.   At [facility] further evaluation revealed that she did not have a pleural effusion but dense consolidation in the left lung. Transferred out of the ICU. She continued to do well with improvement in aeration on the left lung. However she is transferred back to the stepdown today with increasing lethargy, hypercarbia on BiPAP. PCCM asked to consult again.  SUBJECTIVE: Improved, feels comfortable at rest.  Remains afebrile, leukocytosis improving.  VITAL SIGNS: BP 130/92 mmHg  Pulse 84  Temp(Src) 98 F (36.7 C) (Oral)  Resp 20  Ht 5\' 4"  (1.626 m)  Wt 85.911 kg (189 lb 6.4 oz)  BMI 32.49 kg/m2  SpO2  94%  HEMODYNAMICS:    VENTILATOR SETTINGS:    INTAKE / OUTPUT: I/O last 3 completed shifts: In: 1740 [P.O.:1440; IV Piggyback:300] Out: 1500 [Urine:1500]  PHYSICAL EXAMINATION: General:  Obese female, resting in bed, in NAD Neuro:  AAOx4, no focal deficits  HEENT:  MMM, PERRL Cardiovascular:  S1, S2, no murmurs rubs gallops Lungs:  Mild rhonchi on L Abdomen:  Obese, soft, positive bowel sounds Musculoskeletal:  No acute deformities  Skin:  Warm/dry, L foot with fine lacy erythema, blanches, 1+ BLE pitting edema  LABS:  BMET  Recent Labs Lab 09/05/15 1604 09/06/15 0226 09/08/15 0535  NA 139 139 139  K 5.2* 4.6 4.4  CL 105 105 101  CO2 26 26 32  BUN 10 9 10   CREATININE 0.59 0.60 0.61  GLUCOSE 89 115* 90    Electrolytes  Recent Labs Lab 09/04/15 1609 09/05/15 1604 09/06/15 0226 09/08/15 0535  CALCIUM  --  7.8* 7.9* 8.3*  MG 1.8  --   --   --   PHOS 5.1*  --   --   --     CBC  Recent Labs Lab 09/05/15 1604 09/06/15 0226 09/08/15 0535  WBC 23.1* 16.6* 13.4*  HGB 11.8* 10.7* 12.4  HCT 36.4 35.5* 40.0  PLT 164 184 281    Coag's No results for input(s): APTT, INR in the last 168 hours.  Sepsis Markers  Recent Labs Lab 09/01/15 1510  09/02/15 0225 09/03/15 0405 09/04/15 1203  LATICACIDVEN  --   --   --  1.2  PROCALCITON 7.28 6.87 4.47  --     ABG  Recent Labs Lab 09/05/15 1420 09/05/15 1716 09/05/15 2125  PHART 7.274* 7.271* 7.312*  PCO2ART 61.9* 62.4* 53.5*  PO2ART 100 105.0* 89.8    Liver Enzymes  Recent Labs Lab 09/03/15 0405  AST 62*  ALT 30  ALKPHOS 363*  BILITOT 4.0*  ALBUMIN 1.5*    Cardiac Enzymes No results for input(s): TROPONINI, PROBNP in the last 168 hours.  Glucose No results for input(s): GLUCAP in the last 168 hours.  Imaging No results found.   STUDIES:  1/02  L Pleural Assessment >> small amount pleural fluid, first image would consider diagnostic thora only, second more lateral appears to  have larger pocket, see images above 1/3 ct chest>>>dense necrotizing PNA 1/5 >>  improved aeration  CXR 1/7 > diffuse infiltrates / consolidation on L CXR 1/9 >  CULTURES: UA 1/2 >> negative Blood culture 1/2 >> strep pneumonia   ANTIBIOTICS:  PCN/Sulfa Allergy Vanco 1/2 >> 1/4 Levaquin 1/2 >> 1/4 clinda 1/3>>> 1/5 Ceftiaxone 1/4 >> 1/9 Fluconazole 1/5 >> 1/7 Levaquin 1/9 >    SIGNIFICANT EVENTS: 1/02  Admit from [facility] with SOB, weight loss, opacification of L hemithorax   LINES/TUBES:   DISCUSSION: 60 y/o smoker with a PMH of O2 dependent COPD, anxiety/depression/fibromyalgia admitted 1/2 with a 2 wk hx of SOB, found to have opacification of the left pneumonia.  [facility].  Initial presentation with elevated Lactic acid cleared with volume resuscitation.    She has dense opacification of the left lung with no pleural effusions, Streptococcus pneumonia bacteremia.  Repeat CT scan 1/5 shows improving aeration.  Transferred to the stepdown 1/6  with hypercarbic respiratory failure requiring BiPAP   Recs: - Continue supplemental O2 as needed to maintain SpO2 > 90%. - Continue PRN BiPAP. - Continue prednisone, taper to off. - Continue levaquin. - Pulmonary hygiene. - Repeat CXR.  PCCM will sign off.  Please do not hesitate to call us back if we can be of any further assistance.   Rutherford Guysahul Maximillion Gill, [place] - [facility] [place] [facility] Pager: [phone]  or [phone] 09/08/2015, 9:45 AM

## 2015-09-08 NOTE — Procedures (Signed)
Pt uses the flutter device on her own for CPT.

## 2015-09-08 NOTE — Evaluation (Signed)
Physical Therapy Evaluation Patient Details Name: Bethany Villa MRN: [id number] DOB: 07-Jul-1956 Today's Date: 09/08/2015   History of Present Illness  60 y/o F with PMH of DJD, GERD, HLD, fibromyalgia, chronic pain (followed at a pain clinic), anxiety / depression, and O2 dependent COPD who presented to [facility] on 09/01/15 with complaints of SOB. Found to have strep pneumonia.  Clinical Impression  Patient presents currently at supervision to [facility] assist level for mobility.  She was independent prior and will benefit from skilled PT in the acute setting to address issues noted in PT problem list and allow d/c home alone with HHPT follow up.    Follow Up Recommendations Home health PT    Equipment Recommendations  Rolling walker with 5" wheels    Recommendations for Other Services       Precautions / Restrictions Precautions Precautions: Fall Precaution Comments: L knee weakness/pain due to needs TKA, h/o bilat ankle injuries, also reports at times has vertigo      Mobility  Bed Mobility Overal bed mobility: Modified Independent                Transfers Overall transfer level: Needs assistance Equipment used: None;Rolling walker (2 wheeled) (none to stand, walker to sit) Transfers: Sit to/from Stand Sit to Stand: Supervision;Min guard         General transfer comment: assist due to imbalance initially; stand to sit better with cues and use of walker  Ambulation/Gait Ambulation/Gait assistance: Supervision;Min guard Ambulation Distance (Feet): 60 Feet (x 2) Assistive device: None;Rolling walker (2 wheeled) Gait Pattern/deviations: Step-through pattern;Decreased stride length;Decreased stance time - left;Decreased step length - right;Antalgic     General Gait Details: initially without device, slower and more antaligic, cautious, felt more confident with walker and able to tolerate with increased speed, cues for management of walker in small space  Stairs            Wheelchair Mobility    Modified Rankin (Stroke Patients Only)       Balance Overall balance assessment: Needs assistance Sitting-balance support: No upper extremity supported Sitting balance-Leahy Scale: Good     Standing balance support: No upper extremity supported Standing balance-Leahy Scale: Fair Standing balance comment: static balance good without UE support, better with walker for ambulation due to weakness, unsteadiness and L knee pain; stood at sink to comb hair, but c/o back pain in static standing                             Pertinent Vitals/Pain Pain Assessment: Faces Faces Pain Scale: Hurts little more Pain Location: L knee with ambulation, better with walker Pain Descriptors / Indicators: Aching Pain Intervention(s): Monitored during session;Limited activity within patient's tolerance;Other (comment) (walker)    Home Living Family/patient expects to be discharged to:: Private residence Living Arrangements: Alone   Type of Home: Apartment Home Access: Stairs to enter;Elevator   Entrance Stairs-Number of Steps: 6, plans to use elevator Home Layout: One level Home Equipment: None      Prior Function Level of Independence: Independent         Comments: having difficulty at times     Hand Dominance        Extremity/Trunk Assessment               Lower Extremity Assessment: Generalized weakness         Communication   Communication: No difficulties  Cognition Arousal/Alertness: Awake/alert Behavior During Therapy:  WFL for tasks assessed/performed Overall Cognitive Status: Within Functional Limits for tasks assessed                      General Comments General comments (skin integrity, edema, etc.): Ambulated on 2L O2 SpO2 97%    Exercises        Assessment/Plan    PT Assessment Patient needs continued PT services  PT Diagnosis Generalized weakness;Abnormality of gait   PT Problem List Decreased  activity tolerance;Decreased balance;Decreased mobility;Pain;Decreased knowledge of use of DME;Decreased safety awareness;Decreased strength  PT Treatment Interventions DME instruction;Balance training;Gait training;Functional mobility training;Patient/family education;Therapeutic activities;Therapeutic exercise   PT Goals (Current goals can be found in the Care Plan section) Acute Rehab PT Goals Patient Stated Goal: To go home, get stronger PT Goal Formulation: With patient Time For Goal Achievement: 09/15/15 Potential to Achieve Goals: Good    Frequency Min 3X/week   Barriers to discharge        Co-evaluation               End of Session Equipment Utilized During Treatment: Gait belt;Oxygen Activity Tolerance: Patient limited by fatigue Patient left: in bed;with call bell/phone within reach           Time: [id number] PT Time Calculation (min) (ACUTE ONLY): 28 min   Charges:   PT Evaluation $PT Eval Moderate Complexity: 1 Procedure PT Treatments $Gait Training: 8-22 mins   PT G CodesElray Mcgregor:        Cynthia Wynn 09/08/2015, 4:52 PM  Sheran Lawlessyndi Wynn, PT [phone] 09/08/2015

## 2015-09-09 MED ORDER — PREDNISONE 10 MG (21) PO TBPK: ORAL_TABLET | ORAL | Status: DC

## 2015-09-09 MED ORDER — PREDNISONE 20 MG PO TABS
40.0000 mg | ORAL_TABLET | Freq: Once | ORAL | Status: AC
  Administered 2015-09-10: 40 mg via ORAL
  Filled 2015-09-09: qty 2

## 2015-09-09 MED ORDER — DICLOFENAC SODIUM 75 MG PO TBEC: 75.0000 mg | DELAYED_RELEASE_TABLET | Freq: Two times a day (BID) | ORAL | Status: DC | PRN

## 2015-09-09 MED ORDER — LEVOFLOXACIN 750 MG PO TABS: 750.0000 mg | ORAL_TABLET | Freq: Every day | ORAL | Status: DC

## 2015-09-09 MED ORDER — PREDNISONE 20 MG PO TABS: 40.0000 mg | ORAL_TABLET | Freq: Every day | ORAL | Status: DC

## 2015-09-09 NOTE — Progress Notes (Signed)
[facility] PROGRESS NOTE  Bethany Villa LKG:[id number] DOB: 12/18/1955 DOA: 09/01/2015 PCP: Bennie PieriniMARTIN,MARY MARGARET, FNP  Assessment/Plan: 1. Community-acquired pneumonia. -Bethany Villa presenting with a one-month history of worsening cough associated  shortness of breath, found to have near opacification of left lung. -She was transferred to [facility] to be evaluated by pulmonary critical care medicine.  -She was started initially on broad-spectrum IV antimicrobial therapy with vancomycin, Levaquin, Flagyl. -Blood cultures are growing Streptococcus pneumonia, organism susceptible to cephalosporins. -Antimicrobials narrowed  to ceftriaxone 2 g IV every 24 hours on 09/04/2015 -On 09/04/2015 lab showing an upward trend in her white count to 21,700 from 13,800. Lactic acid within normal limits at 1.2. Her procalcitonin had trended down to 4.47 on 09/03/2015. She continues to appear ill, however, and with her elevated white  -Repeat CT scan of lungs with IV contrast performed on 09/04/2015 showing improved aeration of left upper lobe compared to prior study although continues to have findings consistent with pneumonia. Study did not reveal evidence of loculation. -Repeat blood cultures from 09/03/2015 showing no growth todate -On 09/07/2015 patient showing significant clinical improvement. She is awake, alert, interactive, afebrile and nontoxic appearing. Transfer out of stepdown unit to telemetry. -Plan for a total of 14 days of effective antimicrobial therapy. Plan for discharge in AM on 4 more days of Levaquin.   2.  Sepsis  -Present on admission, evidenced by a white count of 17,300, lactic acid of 2.65, respiratory rate of 28, heart rate of 118, having positive blood cultures for Streptococcus pneumoniae. -Source of infection likely community-acquired pneumonia. -Patient showing gradual improvement over the past 24 hours. -Repeat x-ray showing slight improvement to left-sided  infiltrates. -Susceptibility testing from blood cultures reviewed, organism is susceptible to cephalosporins. Narrowed antibiotic regimen to ceftriaxone 2 g IV every 24 hours and discontinued vancomycin, clindamycin, Flagyl. -Repeat blood cultures drawn on 09/03/2015 showing no growth to date. -She was transitioned to Levaquin 750 mg PO q daily on 09/08/2015  3.  Acute hypoxemic hypercarbic respiratory failure -Present on admission, evidenced by a she presented in respiratory distress, had a respiratory rate of 40 and required BiPAP.  -Likely secondary to community-acquired pneumonia as x-ray revealed large left-sided infiltrate. -Transthoracic echocardiogram did not reveal evidence for CHF. -Showing slow clinical improvement -BiPAP changed to PRN -By 09/07/2015 she was showing significant clinical improvement, satting mid 90s on 2 L supplemental oxygen. -On 09/09/2015 she ambulated down the hallway to nurses station and back  4.  Dyslipidemia. -Continue statin therapy  5.  Hypokalemia. -Potassium levels improved to 3.7 after receiving replacement.  Code Status: Full code Family Communication: Family not present, called Friend who was listed on emergency contact on epic  Disposition Plan: Anticipate discharge in AM. CM consulted to set her up with Home Health Services.    Consultants:  Pulmonary critical care medicine  Antibiotics:  Ceftriaxone 12 g IV every 24 hours (started on 09/03/2015)  Vancomycin stopped on 09/03/2015  Clindamycin stopped on 09/03/2015  Flagyl stopped on 09/03/2015  Transitoned to Levaquin on 09/08/2015  HPI/Subjective: Bethany Villa is a pleasant 60 year old female with a past medical history of dyslipidemia, initially admitted by the pulmonary critical care service on 09/02/2015 when she presented with shortness of breath associate with cough and wheezing. She reported that she had been sick since Thanksgiving. She had a chest x-ray performed on 09/01/2015  that revealed complete opacification of the left hemithorax. She was started on broad-spectrum IV antimicrobial therapy with vancomycin, clindamycin, Flagyl. Repeat  chest x-ray on 09/03/2015 showing slight improvement to left lung infiltrate. Blood cultures drawn on 09/01/2015 groing Streptococcus pneumoniae x 2. Organism susceptible to ceftriaxone.  Objective: Filed Vitals:   09/08/15 2017 09/09/15 0513  BP: 131/78 123/76  Pulse: 74 91  Temp: 97.9 F (36.6 C) 98.6 F (37 C)  Resp: 20     Intake/Output Summary (Last 24 hours) at 09/09/15 0911 Last data filed at 09/09/15 0636  Gross per 24 hour  Intake   1250 ml  Output   2050 ml  Net   -800 ml   Filed Weights   09/07/15 1521 09/08/15 0531 09/09/15 0513  Weight: 86.6 kg (190 lb 14.7 oz) 85.911 kg (189 lb 6.4 oz) 87.952 kg (193 lb 14.4 oz)    Exam:   General:  Looks much better, she is sitting at bedside chair, awake and alert  Cardiovascular: Regular rate and rhythm normal S1-S2 no murmurs rubs or gallops  Respiratory: Significant improvement to lung sounds from left lung, with decreased crackles and rhonchi  Abdomen: Soft nontender nondistended  Musculoskeletal: No edema  Data Reviewed: Basic Metabolic Panel:  Recent Labs Lab 09/03/15 0405 09/04/15 0615 09/04/15 1609 09/05/15 1604 09/06/15 0226 09/08/15 0535  NA 135 138  --  139 139 139  K 3.7 3.6  --  5.2* 4.6 4.4  CL 104 105  --  105 105 101  CO2 25 23  --  26 26 32  GLUCOSE 100* 68  --  89 115* 90  BUN 9 9  --  10 9 10   CREATININE 0.70 0.53  --  0.59 0.60 0.61  CALCIUM 8.2* 8.2*  --  7.8* 7.9* 8.3*  MG  --   --  1.8  --   --   --   PHOS  --   --  5.1*  --   --   --    Liver Function Tests:  Recent Labs Lab 09/03/15 0405  AST 62*  ALT 30  ALKPHOS 363*  BILITOT 4.0*  PROT 4.9*  ALBUMIN 1.5*   No results for input(s): LIPASE, AMYLASE in the last 168 hours. No results for input(s): AMMONIA in the last 168 hours. CBC:  Recent Labs Lab  09/03/15 0405 09/04/15 0615 09/05/15 1604 09/06/15 0226 09/08/15 0535  WBC 13.8* 21.7* 23.1* 16.6* 13.4*  NEUTROABS 11.5*  --   --   --   --   HGB 12.8 13.6 11.8* 10.7* 12.4  HCT 40.6 41.9 36.4 35.5* 40.0  MCV 90.2 90.1 92.2 93.9 93.7  PLT 152 169 164 184 281   Cardiac Enzymes: No results for input(s): CKTOTAL, CKMB, CKMBINDEX, TROPONINI in the last 168 hours. BNP (last 3 results)  Recent Labs  09/04/15 1609  BNP 252.8*    ProBNP (last 3 results) No results for input(s): PROBNP in the last 8760 hours.  CBG: No results for input(s): GLUCAP in the last 168 hours.  Recent Results (from the past 240 hour(s))  Blood Culture (routine x 2)     Status: None   Collection Time: 09/01/15  3:19 AM  Result Value Ref Range Status   Specimen Description BLOOD RIGHT HAND  Final   Special Requests BOTTLES DRAWN AEROBIC AND ANAEROBIC 6CC  Final   Culture  Setup Time   Final    GRAM POSITIVE COCCI IN PAIRS RECOVERED FROM BOTH BOTTLES Gram Stain Report Called to,Read Back By and Verified With: BEABRAUT,J. AT 1656 ON 09/01/2015 BY BAUGHAM,M. Performed at [facility]  Culture   Final    STREPTOCOCCUS PNEUMONIAE Performed at [facility]    Report Status 09/03/2015 FINAL  Final   Organism ID, Bacteria STREPTOCOCCUS PNEUMONIAE  Final      Susceptibility   Streptococcus pneumoniae - MIC*    ERYTHROMYCIN <=0.12 SENSITIVE Sensitive     LEVOFLOXACIN 1 SENSITIVE Sensitive     VANCOMYCIN 0.5 SENSITIVE Sensitive     PENICILLIN <=0.06 SENSITIVE Sensitive     CEFTRIAXONE <=0.12 SENSITIVE Sensitive     * STREPTOCOCCUS PNEUMONIAE  Blood Culture (routine x 2)     Status: None   Collection Time: 09/01/15  3:35 AM  Result Value Ref Range Status   Specimen Description BLOOD LEFT HAND  Final   Special Requests BOTTLES DRAWN AEROBIC ONLY 5CC  Final   Culture  Setup Time   Final    CORRECTED RESULTS GRAM POSITIVE COCCI IN PAIRS CORRECTED RESULTS CALLED TO: ROBBINS,L. ([facility]) AT 1100  ON 09/02/2015 BY BAUGHAM,M. Gram Stain Report Called to,Read Back By and Verified With: PREVIOUSLY TO BEABRAUT,J. AT 1656 ON 08/31/2014 BY BAUGHAM,M. Performed at [facility]    Culture   Final    STREPTOCOCCUS PNEUMONIAE SUSCEPTIBILITIES PERFORMED ON PREVIOUS CULTURE WITHIN THE LAST 5 DAYS. Performed at [facility]    Report Status 09/03/2015 FINAL  Final  Urine culture     Status: None   Collection Time: 09/01/15  4:00 AM  Result Value Ref Range Status   Specimen Description URINE, CATHETERIZED  Final   Special Requests NONE  Final   Culture   Final    NO GROWTH 1 DAY Performed at [facility]    Report Status 09/02/2015 FINAL  Final  MRSA PCR Screening     Status: Abnormal   Collection Time: 09/01/15  7:25 AM  Result Value Ref Range Status   MRSA by PCR POSITIVE (A) NEGATIVE Final    Comment:        The GeneXpert MRSA Assay (FDA approved for NASAL specimens only), is one component of a comprehensive MRSA colonization surveillance program. It is not intended to diagnose MRSA infection nor to guide or monitor treatment for MRSA infections. RESULT CALLED TO, READ BACK BY AND VERIFIED WITH: BEABRAUT RN 10:00 09/01/15 ([email or web address])   Culture, blood (Routine X 2) w Reflex to ID Panel     Status: None   Collection Time: 09/03/15  2:45 PM  Result Value Ref Range Status   Specimen Description BLOOD LEFT ANTECUBITAL  Final   Special Requests IN PEDIATRIC BOTTLE 2CC  Final   Culture NO GROWTH 5 DAYS  Final   Report Status 09/08/2015 FINAL  Final  Culture, blood (Routine X 2) w Reflex to ID Panel     Status: None   Collection Time: 09/03/15  2:52 PM  Result Value Ref Range Status   Specimen Description BLOOD LEFT HAND  Final   Special Requests IN PEDIATRIC BOTTLE .5CC  Final   Culture NO GROWTH 5 DAYS  Final   Report Status 09/08/2015 FINAL  Final     Studies: Dg Chest Port 1 View  09/08/2015  CLINICAL DATA:  Respiratory failure with shortness of breath.  EXAM: PORTABLE CHEST 1 VIEW COMPARISON:  09/06/2015 FINDINGS: 0957 hours. The diffuse interstitial airspace disease involving the left lung has improved slightly in the interval. Right lung remains clear. The cardio pericardial silhouette is enlarged. The visualized bony structures of the thorax are intact. Telemetry leads overlie the chest. IMPRESSION: Slight interval improvement  in diffuse left lung airspace disease compatible with pneumonia. Electronically Signed   By: Kennith Center M.D.   On: 09/08/2015 10:36    Scheduled Meds: . antiseptic oral rinse  7 mL Mouth Rinse BID  . atorvastatin  20 mg Oral q1800  . buPROPion  150 mg Oral Daily  . DULoxetine  60 mg Oral Daily  . fluconazole (DIFLUCAN) IV  100 mg Intravenous Q24H  . heparin subcutaneous  5,000 Units Subcutaneous 3 times per day  . levofloxacin  750 mg Oral Q1200  . nicotine  7 mg Transdermal Daily  . olopatadine  1 drop Both Eyes BID  . pantoprazole  40 mg Oral Daily  . [START ON 09/10/2015] predniSONE  40 mg Oral Once  . predniSONE  50 mg Oral Q breakfast   Continuous Infusions:    Principal Problem:   CAP (community acquired pneumonia) Active Problems:   Sepsis (HCC)   Respiratory failure, acute (HCC)   Acute respiratory failure with hypoxia (HCC)   Acute respiratory failure (HCC)   Chronic obstructive pulmonary disease with acute lower respiratory infection (HCC)    Time spent: 25 min    Jeralyn Bennett  [facility] Pager [phone]. If 7PM-7AM, please contact night-coverage at [email or web address], password [facility] 09/09/2015, 9:11 AM  LOS: 8 days

## 2015-09-09 NOTE — Progress Notes (Signed)
Rolling walker ordered and to be delivered to the room prior to discharging home. Abelino DerrickB Darrin Koman [facility],MHA,BSN [phone]

## 2015-09-09 NOTE — Care Management Note (Signed)
Case Management Note  Patient Details  Name: Bethany Villa MRN: [id number] Date of Birth: 05/09/1956  Subjective/Objective:          Admitted with Pneumonia          Action/Plan: Patient lives at home alone, has help from sister in law and her brother when needed. Patient continues to smoke. Encouragement given to stop smoking, no home 02 at this time. Patient could benefit from a Disease Management Program for Pneumonia. HHC choice offered, patient chose [facility]. [name] with [facility] called for arrangements. [facility] also [facility]; [facility]. Attending MD at discharge please enter the [facility] in Epic.  Expected Discharge Date:   possibly 09/10/2015               Expected Discharge Plan:  Home w Home Health Services  In-House Referral:   [facility]  Discharge planning Services  CM Consult Choice offered to:  Patient  DME Arranged:  Walker rolling DME Agency:  [facility]  HH Arranged:  RN, PT, Disease Management HH Agency:  [facility]  Status of Service:  In process, will continue to follow  Reola MosherChandler, Doylene Splinter L, RN,MHA,BSN [phone] 09/09/2015, 10:33 AM

## 2015-09-09 NOTE — Progress Notes (Signed)
Patient is alert, oriented and ambulatory. Patient refusing bed alarm. Informed patient of purpose of alarm for safety-continued to refuse. Urged patient to call for assistance when getting OOB. Will continue to round and offer bathroom assistance hourly. Bethany Villa M  

## 2015-09-09 NOTE — Discharge Summary (Signed)
Physician Discharge Summary  Bethany Villa:[id number] DOB: 1956/07/03 DOA: 09/01/2015  PCP: Bethany Pierini, FNP  Admit date: 09/01/2015 Discharge date: 09/09/2015  Time spent: 35 minutes  Recommendations for Outpatient Follow-up:  1. Please follow up on respiratory status, she was admitted for Sepsis/PNA/Respiratory Failure.   2. Prior to discharge she was set up with Home Health Services for PT   Discharge Diagnoses:  Principal Problem:   CAP (community acquired pneumonia) Active Problems:   Sepsis (HCC)   Respiratory failure, acute (HCC)   Acute respiratory failure with hypoxia (HCC)   Acute respiratory failure (HCC)   Chronic obstructive pulmonary disease with acute lower respiratory infection (HCC)   Discharge Condition: Stable/Improved  Diet recommendation: Heart Healthy  Filed Weights   09/07/15 1521 09/08/15 0531 09/09/15 0513  Weight: 86.6 kg (190 lb 14.7 oz) 85.911 kg (189 lb 6.4 oz) 87.952 kg (193 lb 14.4 oz)    History of present illness:  60 y/o F with PMH of DJD, GERD, HLD, fibromyalgia, chronic pain (followed at a pain clinic), anxiety / depression, and O2 dependent COPD who presented to [facility] on 09/01/15 with complaints of SOB.   The patient reported a two week history of shortness of breath, non-productive cough, and wheezing. She attempted home nebulized albuterol without relief of symptoms. The patient reports she had to move around Thanksgiving into a new home and feels she became sick to to "how cold it stays in the house". She has noted 30+ lb weight loss over the last month. Last office visit note prior to 07/2015 was in 09/2014 where her weight was recorded at 215 lbs. She denies night sweats but endorses decreased appetite. On presentation, she reported 2 wks of SOB and dark urine. Initial labs notable for Na 134, K 3.0, Cl 96, CK 28, troponin 0.03, WBC 17.3, Hgb 14.9, and platelets 139. UA negative. CXR evaluation demonstrated near opacification  of the left hemithorax. [facility] was unable to evaluate for thoracentesis and she was transferred to [facility] for further evaluation.   Hospital Course:  [name] is a pleasant 60 year old female with a past medical history of dyslipidemia, initially admitted by the pulmonary critical care service on 09/02/2015 when she presented with shortness of breath associate with cough and wheezing. She reported that she had been sick since Thanksgiving. She had a chest x-ray performed on 09/01/2015 that revealed complete opacification of the left hemithorax. She was started on broad-spectrum IV antimicrobial therapy with vancomycin, clindamycin, Flagyl. Repeat chest x-ray on 09/03/2015 showing slight improvement to left lung infiltrate. Blood cultures drawn on 09/01/2015 groing Streptococcus pneumoniae x 2. Organism susceptible to ceftriaxone.   Community-acquired pneumonia. -[name] presenting with a one-month history of worsening cough associated shortness of breath, found to have near opacification of left lung. -She was transferred to [facility] to be evaluated by pulmonary critical care medicine.  -She was started initially on broad-spectrum IV antimicrobial therapy with vancomycin, Levaquin, Flagyl. -Blood cultures are growing Streptococcus pneumonia, organism susceptible to cephalosporins. -Antimicrobials narrowed to ceftriaxone 2 g IV every 24 hours on 09/04/2015 -On 09/04/2015 lab showing an upward trend in her white count to 21,700 from 13,800. Lactic acid within normal limits at 1.2. Her procalcitonin had trended down to 4.47 on 09/03/2015. She appeared ill, having mental status changes. ABG showed hypoxemic hypercarbic respiratory failure and was transfer to SDU for BiPAP.  -Repeat CT scan of lungs with IV contrast performed on 09/04/2015 showing improved aeration of left upper lobe compared  to prior study although continues to have findings consistent with pneumonia. Study did not reveal  evidence of loculation. -Blood cultures were repeated on 09/03/2015 that remained sterile throughout this hospitalization  -On 09/07/2015 patient showing significant clinical improvement. She was more awake, alert, interactive, afebrile and nontoxic appearing. Transfer out of stepdown unit to telemetry on this date. -On my assessment on 09/08/2015 she continued to show improvement. She was transitioned to Oral Levaquin 750 mg PO q daily. Repeat CXR on this date showing improved left sided infiltrates.  -Plan for 14 days of total effective antibiotic therapy.   2. Sepsis  -Present on admission, evidenced by a white count of 17,300, lactic acid of 2.65, respiratory rate of 28, heart rate of 118, having positive blood cultures for Streptococcus pneumoniae. -Source of infection likely community-acquired pneumonia. -Patient showing gradual improvement over the past 24 hours. -Repeat x-ray showing slight improvement to left-sided infiltrates. -Susceptibility testing from blood cultures reviewed, organism is susceptible to cephalosporins. Narrowed antibiotic regimen to ceftriaxone 2 g IV every 24 hours and discontinued vancomycin, clindamycin, Flagyl. -Repeat blood cultures drawn on 09/03/2015 showing no growth to date. -She was transitioned to Levaquin 750 mg PO q daily on 09/08/2015  3. Acute hypoxemic hypercarbic respiratory failure -Present on admission, evidenced by a she presented in respiratory distress, had a respiratory rate of 40 and required BiPAP.  -Likely secondary to community-acquired pneumonia as x-ray revealed large left-sided infiltrate. -Transthoracic echocardiogram did not reveal evidence for CHF. -Showing slow clinical improvement -BiPAP changed to PRN -By 09/07/2015 she was showing significant clinical improvement, satting mid 90s on 2 L supplemental oxygen.  4. Dyslipidemia. -Continue statin therapy  5. Hypokalemia. -Potassium levels improved to 3.7 after receiving  replacement.   Consultations:  Pulmonary Critical Care Medicine  Discharge Exam: Filed Vitals:   09/08/15 2017 09/09/15 0513  BP: 131/78 123/76  Pulse: 74 91  Temp: 97.9 F (36.6 C) 98.6 F (37 C)  Resp: 20      General: Looks much better, we ambulated down the hallway and back   Cardiovascular: Regular rate and rhythm normal S1-S2 no murmurs rubs or gallops  Respiratory: Significant improvement to lung sounds from left lung, with decreased crackles and rhonchi  Abdomen: Soft nontender nondistended  Musculoskeletal: No edema  Discharge Instructions    Current Discharge Medication List    START taking these medications   Details  levofloxacin (LEVAQUIN) 750 MG tablet Take 1 tablet (750 mg total) by mouth daily. Qty: 4 tablet, Refills: 0    predniSONE (STERAPRED UNI-PAK 21 TAB) 10 MG (21) TBPK tablet Take 3-2-1 tablets by mouth daily till gone. Qty: 8 tablet, Refills: 0      CONTINUE these medications which have CHANGED   Details  diclofenac (VOLTAREN) 75 MG EC tablet Take 1 tablet (75 mg total) by mouth 2 (two) times daily as needed. Qty: 30 tablet, Refills: 1      CONTINUE these medications which have NOT CHANGED   Details  albuterol (PROVENTIL) (2.5 MG/3ML) 0.083% nebulizer solution TAKE 3 ML BY NEBULIZATION Q 6 hrs prn FOR WHEEZING OR SOB Dx J44.9 Qty: 75 mL, Refills: 3    atorvastatin (LIPITOR) 20 MG tablet TAKE 1 TABLET (20 MG TOTAL) BY MOUTH DAILY. Qty: 90 tablet, Refills: 1   Associated Diagnoses: Hyperlipidemia with target LDL less than 100    buPROPion (WELLBUTRIN XL) 150 MG 24 hr tablet TAKE 1 TABLET (150 MG TOTAL) BY MOUTH DAILY. Qty: 30 tablet, Refills: 5   Associated  Diagnoses: Depression    diclofenac sodium (VOLTAREN) 1 % GEL Apply 2 g topically 4 (four) times daily. Qty: 100 g, Refills: 3    DULoxetine (CYMBALTA) 60 MG capsule TAKE 1 CAPSULE (60 MG TOTAL) BY MOUTH DAILY. Qty: 30 capsule, Refills: 5   Associated Diagnoses: Depression     fluticasone (FLONASE) 50 MCG/ACT nasal spray Place 2 sprays into both nostrils daily. Qty: 16 g, Refills: 1    Fluticasone Furoate-Vilanterol 100-25 MCG/INH AEPB Inhale 1 puff into the lungs daily. Qty: 14 each, Refills: 0    levalbuterol (XOPENEX HFA) 45 MCG/ACT inhaler Inhale 2 puffs into the lungs every 8 (eight) hours as needed for wheezing or shortness of breath. Qty: 1 Inhaler, Refills: 12    omeprazole (PRILOSEC) 20 MG capsule TAKE 1 CAPSULE (20 MG TOTAL) BY MOUTH DAILY. Qty: 90 capsule, Refills: 1    PATANOL 0.1 % ophthalmic solution PLACE 1 DROP INTO BOTH EYES 2 (TWO) TIMES DAILY. Qty: 15 mL, Refills: 0    promethazine (PHENERGAN) 12.5 MG tablet Take 12.5 mg by mouth every 6 (six) hours as needed for nausea or vomiting.    VENTOLIN HFA 108 (90 BASE) MCG/ACT inhaler INHALE 2 PUFFS INTO THE LUNGS DAILY AS NEEDED FOR WHEEZING OR SHORTNESS OF BREATH. Qty: 18 Inhaler, Refills: 2      STOP taking these medications     benzonatate (TESSALON) 100 MG capsule      cloNIDine (CATAPRES) 0.1 MG tablet      ibuprofen (ADVIL,MOTRIN) 800 MG tablet      cefdinir (OMNICEF) 300 MG capsule      cyclobenzaprine (FLEXERIL) 10 MG tablet      cyclobenzaprine (FLEXERIL) 10 MG tablet      nystatin (MYCOSTATIN) 100000 UNIT/ML suspension      ondansetron (ZOFRAN) 4 MG tablet        Allergies  Allergen Reactions  . Gabapentin Shortness Of Breath and Rash  . Mobic [Meloxicam] Anaphylaxis  . Penicillins Shortness Of Breath and Swelling  . Sulfa Antibiotics Shortness Of Breath and Swelling  . Tramadol Other (See Comments)    Seizures   Follow-up Information    Follow up with Bethany Pierini, FNP In 2 weeks.   Specialty:  Family Medicine   Contact information:   [address] [place] [address] [phone]        The results of significant diagnostics from this hospitalization (including imaging, microbiology, ancillary and laboratory) are listed below for  reference.    Significant Diagnostic Studies: Dg Chest 2 View  09/04/2015  CLINICAL DATA:  Cough and shortness of breath for the past month EXAM: CHEST  2 VIEW COMPARISON:  Portable chest x-ray of September 03, 2015 FINDINGS: The right lung is clear. On the left there is persistent interstitial and alveolar opacity throughout the upper lobe. There is hazy increased density in the lower lobe with obscuration of the heart border. There is pleural fluid blunting the posterior costophrenic angle on the left. The bony thorax exhibits no acute abnormality. IMPRESSION: Persistent infiltrate throughout the left lung worrisome for pneumonia. Improved appearance of the right lung base. No evidence of pulmonary edema. Electronically Signed   By: David  Swaziland M.D.   On: 09/04/2015 09:17   Ct Chest Wo Contrast  09/02/2015  CLINICAL DATA:  Pneumonia EXAM: CT CHEST WITHOUT CONTRAST TECHNIQUE: Multidetector CT imaging of the chest was performed following the standard protocol without IV contrast. COMPARISON:  Chest x-ray 09/01/2015 FINDINGS: Extensive consolidation in the left upper  lobe with expansion of left upper lobe and multiple air bronchograms and diffuse infiltrate. Multiple small gas pockets are present in the left upper lobe compatible with infection and necrosis. No definite abscess or air-fluid level. No definite mass lesion however mass lesion could be missed given lack of intravenous contrast and extensive consolidation. Minimal left pleural effusion. Mild dependent atelectasis in the lung bases left greater than right. No significant effusion on the right. Small areas infiltrate in the right upper lobe anteriorly. Heart size within normal limits. No pericardial effusion. Small very carina lymph nodes. No definite mass lesion. No lung nodule identified. Mild thoracic disc degeneration. Negative for fracture or spinal infection. Upper abdomen reveals no acute abnormality. IMPRESSION: Complete consolidation left  upper lobe with air bronchograms and low multiple gas pockets in the lung suggesting necrotizing pneumonia. Minimal left pleural effusion. Mild dependent atelectasis in the lung bases left greater than right. Smaller infiltrate right upper lobe anteriorly. No evidence of mass lesion. Note that the patient did not receive intra intravenous contrast which limits evaluation for mass or lung abscess. Electronically Signed   By: Marlan Palau M.D.   On: 09/02/2015 07:32   Ct Chest W Contrast  09/04/2015  CLINICAL DATA:  Pneumonia EXAM: CT CHEST WITH CONTRAST TECHNIQUE: Multidetector CT imaging of the chest was performed during intravenous contrast administration. CONTRAST:  75mL OMNIPAQUE IOHEXOL 300 MG/ML  SOLN COMPARISON:  09/01/2015 FINDINGS: Mediastinum/Nodes: Right upper paratracheal lymph node 1 cm in short axis, image 6 series 2, previously 1.2 cm. AP window lymph node 1.2 cm in short axis on image 17 series 2, previously indistinct due to lack of IV contrast. Left lower paratracheal lymph node 1.2 cm on image 17 series 2, stable. Coronary, aortic arch, and branch vessel atherosclerotic vascular disease. Mild cardiomegaly. Lungs/Pleura: Small left pleural effusion without definite enhancement of the parietal pleural margin. There is consolidation in the entire left upper lobe, still striking, but with some intervally improved duration. No pulmonary abscess identified. Air bronchograms noted. Mild atelectasis in the right lower lobe along the right hemidiaphragm. I do not see an obstructing lesion in the proximal tracheobronchial tree. Upper abdomen: Unremarkable Musculoskeletal: Mild thoracic spondylosis. IMPRESSION: 1. Improved aeration in the left upper lobe compared to 09/01/2015, although there still striking airspace opacity in the entire left upper lobe, compatible with pneumonia. Reactive adenopathy in the mediastinum. 2. Coronary, aortic arch, and branch vessel atherosclerotic vascular disease. 3. Stable  small left pleural effusion. 4. Mild atelectasis in the right lower lobe along the hemidiaphragm. Electronically Signed   By: Gaylyn Rong M.D.   On: 09/04/2015 17:06   Dg Chest Port 1 View  09/08/2015  CLINICAL DATA:  Respiratory failure with shortness of breath. EXAM: PORTABLE CHEST 1 VIEW COMPARISON:  09/06/2015 FINDINGS: 0957 hours. The diffuse interstitial airspace disease involving the left lung has improved slightly in the interval. Right lung remains clear. The cardio pericardial silhouette is enlarged. The visualized bony structures of the thorax are intact. Telemetry leads overlie the chest. IMPRESSION: Slight interval improvement in diffuse left lung airspace disease compatible with pneumonia. Electronically Signed   By: Kennith Center M.D.   On: 09/08/2015 10:36   Dg Chest Port 1 View  09/06/2015  CLINICAL DATA:  Acute respiratory failure EXAM: PORTABLE CHEST 1 VIEW COMPARISON:  09/04/2015 chest radiograph. FINDINGS: Stable cardiomediastinal silhouette with normal heart size. No pneumothorax. No pleural effusion. Severe patchy consolidation throughout the left lung, which appears slightly worsened at the left lung base.  No acute consolidative airspace disease in the right lung. IMPRESSION: Severe patchy consolidation throughout the left lung, with slight interval worsening at the left lung base, most in keeping with progressive severe pneumonia. Electronically Signed   By: Delbert Phenix M.D.   On: 09/06/2015 07:29   Dg Chest Port 1 View  09/03/2015  CLINICAL DATA:  Pneumonia. EXAM: PORTABLE CHEST 1 VIEW COMPARISON:  09/02/2015 . FINDINGS: Mediastinum hilar structures are unremarkable . Diffuse left lung infiltrate, slight interim improvement. Right lung is clear. Cardiomegaly. No pleural effusion or pneumothorax. IMPRESSION: 1. Diffuse left lung infiltrate, slightly improved. Mild atelectasis and/or infiltrate right lung base. 2. Stable cardiomegaly.  No pulmonary venous congestion.  Electronically Signed   By: Maisie Fus  Register   On: 09/03/2015 08:09   Dg Chest Port 1 View  09/02/2015  CLINICAL DATA:  Community-acquired pneumonia, current smoker, history of COPD, obesity. EXAM: PORTABLE CHEST 1 VIEW COMPARISON:  CT scan of the chest and chest x-ray dated September 01, 2015. FINDINGS: Slight improvement in aeration of the left lung is noted. A large amount of alveolar opacity persists throughout the left lung. There is no significant mediastinal shift. The right lung is well-expanded and clear. The right heart border is normal in appearance but the left heart border is obscured. The trachea is midline. The bony thorax is unremarkable. There is gaseous distention of the stomach or bowel under the hemidiaphragm. IMPRESSION: Findings consistent with widespread pneumonia on the left or less likely a central obstructing process. On the right the lung is clear. There is no pleural effusion or pneumothorax on the right. Gaseous distention of the stomach or bowel under in the upper abdomen. Electronically Signed   By: David  [place]iland M.D.   On: 09/02/2015 07:24   Dg Chest Portable 1 View  09/01/2015  CLINICAL DATA:  Acute onset of shortness of breath. Initial encounter. EXAM: PORTABLE CHEST 1 VIEW COMPARISON:  Chest radiograph from 10/17/2014 FINDINGS: There is complete opacification of the left hemithorax, thought to reflect a combination of pleural effusion and airspace opacification. Mild vascular congestion is noted. The right lung remains grossly clear. No pneumothorax is seen. The cardiomediastinal silhouette is borderline normal in size. No acute osseous abnormalities are seen. IMPRESSION: Complete opacification of the left hemithorax, thought to reflect a combination of pleural effusion and airspace opacification. Mild vascular congestion noted. This is concerning for pneumonia, though asymmetric interstitial edema might have a similar appearance. Electronically Signed   By: Roanna Raider M.D.    On: 09/01/2015 03:03    Microbiology: Recent Results (from the past 240 hour(s))  Blood Culture (routine x 2)     Status: None   Collection Time: 09/01/15  3:19 AM  Result Value Ref Range Status   Specimen Description BLOOD RIGHT HAND  Final   Special Requests BOTTLES DRAWN AEROBIC AND ANAEROBIC 6CC  Final   Culture  Setup Time   Final    GRAM POSITIVE COCCI IN PAIRS RECOVERED FROM BOTH BOTTLES Gram Stain Report Called to,Read Back By and Verified With: BEABRAUT,J. AT 1656 ON 09/01/2015 BY BAUGHAM,M. Performed at [facility]    Culture   Final    STREPTOCOCCUS PNEUMONIAE Performed at [facility]    Report Status 09/03/2015 FINAL  Final   Organism ID, Bacteria STREPTOCOCCUS PNEUMONIAE  Final      Susceptibility   Streptococcus pneumoniae - MIC*    ERYTHROMYCIN <=0.12 SENSITIVE Sensitive     LEVOFLOXACIN 1 SENSITIVE Sensitive  VANCOMYCIN 0.5 SENSITIVE Sensitive     PENICILLIN <=0.06 SENSITIVE Sensitive     CEFTRIAXONE <=0.12 SENSITIVE Sensitive     * STREPTOCOCCUS PNEUMONIAE  Blood Culture (routine x 2)     Status: None   Collection Time: 09/01/15  3:35 AM  Result Value Ref Range Status   Specimen Description BLOOD LEFT HAND  Final   Special Requests BOTTLES DRAWN AEROBIC ONLY 5CC  Final   Culture  Setup Time   Final    CORRECTED RESULTS GRAM POSITIVE COCCI IN PAIRS CORRECTED RESULTS CALLED TO: ROBBINS,L. ([facility]) AT 1100 ON 09/02/2015 BY BAUGHAM,M. Gram Stain Report Called to,Read Back By and Verified With: PREVIOUSLY TO BEABRAUT,J. AT 1656 ON 08/31/2014 BY BAUGHAM,M. Performed at [facility]    Culture   Final    STREPTOCOCCUS PNEUMONIAE SUSCEPTIBILITIES PERFORMED ON PREVIOUS CULTURE WITHIN THE LAST 5 DAYS. Performed at [facility]    Report Status 09/03/2015 FINAL  Final  Urine culture     Status: None   Collection Time: 09/01/15  4:00 AM  Result Value Ref Range Status   Specimen Description URINE, CATHETERIZED  Final   Special  Requests NONE  Final   Culture   Final    NO GROWTH 1 DAY Performed [facility]    Report Status 09/02/2015 FINAL  Final  MRSA PCR Screening     Status: Abnormal   Collection Time: 09/01/15  7:25 AM  Result Value Ref Range Status   MRSA by PCR POSITIVE (A) NEGATIVE Final    Comment:        The GeneXpert MRSA Assay (FDA approved for NASAL specimens only), is one component of a comprehensive MRSA colonization surveillance program. It is not intended to diagnose MRSA infection nor to guide or monitor treatment for MRSA infections. RESULT CALLED TO, READ BACK BY AND VERIFIED WITH: BEABRAUT RN 10:00 09/01/15 ([email or web address])   Culture, blood (Routine X 2) w Reflex to ID Panel     Status: None   Collection Time: 09/03/15  2:45 PM  Result Value Ref Range Status   Specimen Description BLOOD LEFT ANTECUBITAL  Final   Special Requests IN PEDIATRIC BOTTLE 2CC  Final   Culture NO GROWTH 5 DAYS  Final   Report Status 09/08/2015 FINAL  Final  Culture, blood (Routine X 2) w Reflex to ID Panel     Status: None   Collection Time: 09/03/15  2:52 PM  Result Value Ref Range Status   Specimen Description BLOOD LEFT HAND  Final   Special Requests IN PEDIATRIC BOTTLE .5CC  Final   Culture NO GROWTH 5 DAYS  Final   Report Status 09/08/2015 FINAL  Final     Labs: Basic Metabolic Panel:  Recent Labs Lab 09/03/15 0405 09/04/15 0615 09/04/15 1609 09/05/15 1604 09/06/15 0226 09/08/15 0535  NA 135 138  --  139 139 139  K 3.7 3.6  --  5.2* 4.6 4.4  CL 104 105  --  105 105 101  CO2 25 23  --  26 26 32  GLUCOSE 100* 68  --  89 115* 90  BUN 9 9  --  10 9 10   CREATININE 0.70 0.53  --  0.59 0.60 0.61  CALCIUM 8.2* 8.2*  --  7.8* 7.9* 8.3*  MG  --   --  1.8  --   --   --   PHOS  --   --  5.1*  --   --   --  Liver Function Tests:  Recent Labs Lab 09/03/15 0405  AST 62*  ALT 30  ALKPHOS 363*  BILITOT 4.0*  PROT 4.9*  ALBUMIN 1.5*   No results for input(s): LIPASE, AMYLASE in the  last 168 hours. No results for input(s): AMMONIA in the last 168 hours. CBC:  Recent Labs Lab 09/03/15 0405 09/04/15 0615 09/05/15 1604 09/06/15 0226 09/08/15 0535  WBC 13.8* 21.7* 23.1* 16.6* 13.4*  NEUTROABS 11.5*  --   --   --   --   HGB 12.8 13.6 11.8* 10.7* 12.4  HCT 40.6 41.9 36.4 35.5* 40.0  MCV 90.2 90.1 92.2 93.9 93.7  PLT 152 169 164 184 281   Cardiac Enzymes: No results for input(s): CKTOTAL, CKMB, CKMBINDEX, TROPONINI in the last 168 hours. BNP: BNP (last 3 results)  Recent Labs  09/04/15 1609  BNP 252.8*    ProBNP (last 3 results) No results for input(s): PROBNP in the last 8760 hours.  CBG: No results for input(s): GLUCAP in the last 168 hours.     Signed:  Jeralyn Bennett MD.  [facility] 09/09/2015, 9:15 AM

## 2015-09-09 NOTE — Progress Notes (Signed)
Physical Therapy Treatment Patient Details Name: Bethany Villa MRN: [id number] DOB: 1956/08/08 Today's Date: 09/09/2015    History of Present Illness 60 y/o F with PMH of DJD, GERD, HLD, fibromyalgia, chronic pain (followed at a pain clinic), anxiety / depression, and O2 dependent COPD who presented to [facility] on 09/01/15 with complaints of SOB. Found to have strep pneumonia.    PT Comments    Progressing with ambulation distance and able to tolerate without O2 with sats 92%.  Pt. Voiced concern about being able to walk far enough to access her apartment due to having to walk down long ramp to access elevator.  Educated her to have her sister go with her to make sure she can sit if needed.  Seems to anticipate d/c soon, encouraged more time up OOB today and obtained recliner for her room.  Will follow up if not d/c.  Follow Up Recommendations  Home health PT     Equipment Recommendations  Rolling walker with 5" wheels    Recommendations for Other Services       Precautions / Restrictions Precautions Precautions: Fall Precaution Comments: L knee weakness/pain due to needs TKA, h/o bilat ankle injuries, also reports at times has vertigo    Mobility  Bed Mobility Overal bed mobility: Modified Independent                Transfers Overall transfer level: Modified independent                  Ambulation/Gait Ambulation/Gait assistance: Supervision Ambulation Distance (Feet): 130 Feet Assistive device: Rolling walker (2 wheeled) Gait Pattern/deviations: Step-through pattern;Decreased stride length;Antalgic     General Gait Details: antalgic on L, reported felt she needed a chair after about 70', but continued without seated rest   Stairs            Wheelchair Mobility    Modified Rankin (Stroke Patients Only)       Balance Overall balance assessment: Needs assistance           Standing balance-Leahy Scale: Fair                       Cognition Arousal/Alertness: Awake/alert Behavior During Therapy: WFL for tasks assessed/performed Overall Cognitive Status: Within Functional Limits for tasks assessed                      Exercises      General Comments General comments (skin integrity, edema, etc.): SpO2 92% ambulating on RA, HR 103      Pertinent Vitals/Pain Pain Assessment: No/denies pain    Home Living                      Prior Function            PT Goals (current goals can now be found in the care plan section) Progress towards PT goals: Progressing toward goals    Frequency  Min 3X/week    PT Plan Current plan remains appropriate    Co-evaluation             End of Session   Activity Tolerance: Patient limited by fatigue Patient left: in chair;with call bell/phone within reach     Time: 1210-1233 PT Time Calculation (min) (ACUTE ONLY): 23 min  Charges:  $Gait Training: 23-37 mins                    G  Codes:      Bethany Villa 09/09/2015, 1:12 PM  Bethany Villa, [place]PT [phone] 09/09/2015

## 2015-09-10 DIAGNOSIS — A419 Sepsis, unspecified organism: Secondary | ICD-10-CM

## 2015-09-10 MED ORDER — OXYCODONE HCL 5 MG PO TABS: 5.0000 mg | ORAL_TABLET | ORAL | Status: DC | PRN

## 2015-09-10 NOTE — Progress Notes (Signed)
Patient alert and oriented, denies pain, no shortness of breath.IV and tele d/c. Discharge instruction, prescription  explain and given to the patient, all questions answered, patient verbalized understanding. V/S  Stable. Will d/c  Patient per order.

## 2015-09-10 NOTE — Discharge Summary (Deleted)
[facility] PROGRESS NOTE  Bethany Villa:[id number] DOB: 1955/09/19 DOA: 09/01/2015 PCP: Bennie Pierini, FNP  Assessment/Plan: 1. Community-acquired pneumonia. -Bethany Villa presenting with a one-month history of worsening cough associated shortness of breath, found to have near opacification of left lung. -She was transferred to [facility] to be evaluated by pulmonary critical care medicine.  -She was started initially on broad-spectrum IV antimicrobial therapy with vancomycin, Levaquin, Flagyl. -Blood cultures are growing Streptococcus pneumonia, organism susceptible to cephalosporins. -Antimicrobials narrowed to ceftriaxone 2 g IV every 24 hours on 09/04/2015 -On 09/04/2015 lab showing an upward trend in her white count to 21,700 from 13,800. Lactic acid within normal limits at 1.2. Her procalcitonin had trended down to 4.47 on 09/03/2015. She continued to appear ill, however, and with her elevated white  -Repeat CT scan of lungs with IV contrast performed on 09/04/2015 showed improved aeration of left upper lobe compared to prior study although continues to have findings consistent with pneumonia. Study did not reveal evidence of loculation. -Repeat blood cultures from 09/03/2015 showed no growth to date -On 09/07/2015 patient with significant clinical improvement. She was awake, alert, interactive, afebrile and nontoxic appearing. Transferred out of stepdown unit to telemetry. -Plan for a total of 14 days of effective antimicrobial therapy. Plan for discharge today on 3 more days of Levaquin.   2. Sepsis  -Present on admission, evidenced by a white count of 17,300, lactic acid of 2.65, respiratory rate of 28, heart rate of 118, having positive blood cultures for Streptococcus pneumoniae. -Source of infection likely community-acquired pneumonia. -Patient showing gradual improvement over the past 24 hours. -Repeat x-ray showing slight improvement to left-sided  infiltrates. -Susceptibility testing from blood cultures reviewed, organism is susceptible to cephalosporins. Narrowed antibiotic regimen to ceftriaxone 2 g IV every 24 hours and discontinued vancomycin, clindamycin, Flagyl. -Repeat blood cultures drawn on 09/03/2015 showing no growth to date. -She was transitioned to Levaquin 750 mg PO q daily on 09/08/2015 will be treated for total of 14 days of antibiotic treatment.  3. Acute hypoxemic hypercarbic respiratory failure -Present on admission, as she presented in respiratory distress, had a respiratory rate of 40 and required BiPAP.  -Likely secondary to community-acquired pneumonia as x-ray revealed large left-sided infiltrate. -Transthoracic echocardiogram did not reveal evidence for CHF. -Improving clinically.  -BiPAP changed to PRN. Has not required BiPAP further. -By 09/07/2015 she was showing significant clinical improvement, satting mid 90s on 2 L supplemental oxygen. -On 09/09/2015 she ambulated down the hallway to nurses station and back  4. Dyslipidemia. -Continue statin therapy  5. Hypokalemia. -Potassium levels improved to 3.7 after receiving replacement.  6. Chronic pain Patient's chronic pain currently being managed with Flexeril as needed, oxycodone as needed,as well as Ultram as needed. Patient asking for a few pills until she is able to be seen at the pain clinic. Will discharge patient with a prescription of oxycodone 5 mg every 4 hours as needed for pain, dispense #20 tablets until patient is seen at the pain clinic.  Code Status: full Family Communication: Updated patient. No family at bedside. Disposition Plan: Discharge home   Consultants:  PCCM admission  Procedures:   CT chest 09/01/2015, 09/04/2015  Chest x-ray 09/01/2015, 09/02/2015, 09/03/2015, 09/04/2015, 09/06/2015, 09/08/2015    2-D echo 09/05/2015  Antibiotics: 2. Ceftriaxone 12 g IV every 24 hours (started on 09/03/2015) 3. Vancomycin stopped  on 09/03/2015 4. Clindamycin stopped on 09/03/2015 5. Flagyl stopped on 09/03/2015 6. Transitoned to Levaquin on 09/08/2015 7.  HPI/Subjective: Patient states she's feeling better. Patient states shortness of breath has improved. Patient asking to be bridged with some pain medication prior to her going back to the pain clinic on discharge.  Objective: Filed Vitals:   09/09/15 2042 09/10/15 0636  BP: 124/68 132/61  Pulse: 92 84  Temp: 99 F (37.2 C) 98.9 F (37.2 C)  Resp: 20 18    Intake/Output Summary (Last 24 hours) at 09/10/15 1135 Last data filed at 09/10/15 0939  Gross per 24 hour  Intake   1600 ml  Output    950 ml  Net    650 ml   Filed Weights   09/08/15 0531 09/09/15 0513 09/10/15 0636  Weight: 85.911 kg (189 lb 6.4 oz) 87.952 kg (193 lb 14.4 oz) 87.499 kg (192 lb 14.4 oz)    Exam:   General:  NAD  Cardiovascular: RRR  Respiratory: CTAB  Abdomen: Soft, nontender, nondistended, positive bowel sounds.  Musculoskeletal: No clubbing cyanosis. Trace- 1+ BLE  edema  Data Reviewed: Basic Metabolic Panel:  Recent Labs Lab 09/04/15 0615 09/04/15 1609 09/05/15 1604 09/06/15 0226 09/08/15 0535  NA 138  --  139 139 139  K 3.6  --  5.2* 4.6 4.4  CL 105  --  105 105 101  CO2 23  --  26 26 32  GLUCOSE 68  --  89 115* 90  BUN 9  --  10 9 10   CREATININE 0.53  --  0.59 0.60 0.61  CALCIUM 8.2*  --  7.8* 7.9* 8.3*  MG  --  1.8  --   --   --   PHOS  --  5.1*  --   --   --    Liver Function Tests: No results for input(s): AST, ALT, ALKPHOS, BILITOT, PROT, ALBUMIN in the last 168 hours. No results for input(s): LIPASE, AMYLASE in the last 168 hours. No results for input(s): AMMONIA in the last 168 hours. CBC:  Recent Labs Lab 09/04/15 0615 09/05/15 1604 09/06/15 0226 09/08/15 0535  WBC 21.7* 23.1* 16.6* 13.4*  HGB 13.6 11.8* 10.7* 12.4  HCT 41.9 36.4 35.5* 40.0  MCV 90.1 92.2 93.9 93.7  PLT 169 164 184 281   Cardiac Enzymes: No results for  input(s): CKTOTAL, CKMB, CKMBINDEX, TROPONINI in the last 168 hours. BNP (last 3 results)  Recent Labs  09/04/15 1609  BNP 252.8*    ProBNP (last 3 results) No results for input(s): PROBNP in the last 8760 hours.  CBG: No results for input(s): GLUCAP in the last 168 hours.  Recent Results (from the past 240 hour(s))  Blood Culture (routine x 2)     Status: None   Collection Time: 09/01/15  3:19 AM  Result Value Ref Range Status   Specimen Description BLOOD RIGHT HAND  Final   Special Requests BOTTLES DRAWN AEROBIC AND ANAEROBIC 6CC  Final   Culture  Setup Time   Final    GRAM POSITIVE COCCI IN PAIRS RECOVERED FROM BOTH BOTTLES Gram Stain Report Called to,Read Back By and Verified With: BEABRAUT,J. AT 1656 ON 09/01/2015 BY BAUGHAM,M. Performed at [facility]    Culture   Final    STREPTOCOCCUS PNEUMONIAE Performed at [facility]    Report Status 09/03/2015 FINAL  Final   Organism ID, Bacteria STREPTOCOCCUS PNEUMONIAE  Final      Susceptibility   Streptococcus pneumoniae - MIC*    ERYTHROMYCIN <=0.12 SENSITIVE Sensitive     LEVOFLOXACIN 1 SENSITIVE Sensitive  VANCOMYCIN 0.5 SENSITIVE Sensitive     PENICILLIN <=0.06 SENSITIVE Sensitive     CEFTRIAXONE <=0.12 SENSITIVE Sensitive     * STREPTOCOCCUS PNEUMONIAE  Blood Culture (routine x 2)     Status: None   Collection Time: 09/01/15  3:35 AM  Result Value Ref Range Status   Specimen Description BLOOD LEFT HAND  Final   Special Requests BOTTLES DRAWN AEROBIC ONLY 5CC  Final   Culture  Setup Time   Final    CORRECTED RESULTS GRAM POSITIVE COCCI IN PAIRS CORRECTED RESULTS CALLED TO: ROBBINS,L. ([facility]) AT 1100 ON 09/02/2015 BY BAUGHAM,M. Gram Stain Report Called to,Read Back By and Verified With: PREVIOUSLY TO BEABRAUT,J. AT 1656 ON 08/31/2014 BY BAUGHAM,M. Performed at [facility]    Culture   Final    STREPTOCOCCUS PNEUMONIAE SUSCEPTIBILITIES PERFORMED ON PREVIOUS CULTURE WITHIN THE LAST 5  DAYS. Performed at [facility]    Report Status 09/03/2015 FINAL  Final  Urine culture     Status: None   Collection Time: 09/01/15  4:00 AM  Result Value Ref Range Status   Specimen Description URINE, CATHETERIZED  Final   Special Requests NONE  Final   Culture   Final    NO GROWTH 1 DAY Performed at [facility]    Report Status 09/02/2015 FINAL  Final  MRSA PCR Screening     Status: Abnormal   Collection Time: 09/01/15  7:25 AM  Result Value Ref Range Status   MRSA by PCR POSITIVE (A) NEGATIVE Final    Comment:        The GeneXpert MRSA Assay (FDA approved for NASAL specimens only), is one component of a comprehensive MRSA colonization surveillance program. It is not intended to diagnose MRSA infection nor to guide or monitor treatment for MRSA infections. RESULT CALLED TO, READ BACK BY AND VERIFIED WITH: BEABRAUT RN 10:00 09/01/15 ([email or web address])   Culture, blood (Routine X 2) w Reflex to ID Panel     Status: None   Collection Time: 09/03/15  2:45 PM  Result Value Ref Range Status   Specimen Description BLOOD LEFT ANTECUBITAL  Final   Special Requests IN PEDIATRIC BOTTLE 2CC  Final   Culture NO GROWTH 5 DAYS  Final   Report Status 09/08/2015 FINAL  Final  Culture, blood (Routine X 2) w Reflex to ID Panel     Status: None   Collection Time: 09/03/15  2:52 PM  Result Value Ref Range Status   Specimen Description BLOOD LEFT HAND  Final   Special Requests IN PEDIATRIC BOTTLE .5CC  Final   Culture NO GROWTH 5 DAYS  Final   Report Status 09/08/2015 FINAL  Final     Studies: No results found.  Scheduled Meds: . antiseptic oral rinse  7 mL Mouth Rinse BID  . atorvastatin  20 mg Oral q1800  . buPROPion  150 mg Oral Daily  . DULoxetine  60 mg Oral Daily  . fluconazole (DIFLUCAN) IV  100 mg Intravenous Q24H  . heparin subcutaneous  5,000 Units Subcutaneous 3 times per day  . levofloxacin  750 mg Oral Q1200  . nicotine  7 mg Transdermal Daily  . olopatadine   1 drop Both Eyes BID  . pantoprazole  40 mg Oral Daily   Continuous Infusions:   Principal Problem:   CAP (community acquired pneumonia) Active Problems:   Sepsis (HCC)   Respiratory failure, acute (HCC)   Acute respiratory failure with hypoxia (HCC)   Acute respiratory failure (  HCC)   Chronic obstructive pulmonary disease with acute lower respiratory infection (HCC)    Time spent: 35 mins    [facility] Area Regional Medical CenterHOMPSON,Kayti Poss MD [facility] Pager [phone]. If 7PM-7AM, please contact night-coverage at [email or web address], password [email or web address] 09/10/2015, 11:35 AM  LOS: 9 days

## 2015-09-10 NOTE — Consult Note (Signed)
   [facility]   09/10/2015  Bethany Cairoenny E Odowd 11/18/1955 [id number] Patient evaluated for community based chronic disease management services with [facility] as a benefit of patient's [employer] Medicare Insurance. Spoke with patient at bedside to explain [facility]. Patient endorses Bethany PieriniMary Margaret Martin, FNP as her primary care provider. Patient states she could benefit from some hospital follow up. Consent form signed.  Patient will receive post hospital discharge call and will be evaluated for monthly home visits for assessments and disease process education.  Left contact information and [facility] literature at bedside. Made Inpatient Case Manager aware that [facility]. Of note, [facility] services does not replace or interfere with any services that are arranged by inpatient case management or social work.  For additional questions or referrals please contact:   Charlesetta ShanksVictoria Germany Chelf, RN BSN CCM [facility] Lia[facility]  [phone] business mobile phone

## 2015-09-10 NOTE — Progress Notes (Signed)
[facility] PROGRESS NOTE  Bethany Villa KGM:[id number] DOB: Oct 30, 1955 DOA: 09/01/2015 PCP: Bennie Pierini, FNP  Assessment/Plan: 1. Community-acquired pneumonia. -Bethany Villa presenting with a one-month history of worsening cough associated shortness of breath, found to have near opacification of left lung. -She was transferred to [facility] to be evaluated by pulmonary critical care medicine.  -She was started initially on broad-spectrum IV antimicrobial therapy with vancomycin, Levaquin, Flagyl. -Blood cultures are growing Streptococcus pneumonia, organism susceptible to cephalosporins. -Antimicrobials narrowed to ceftriaxone 2 g IV every 24 hours on 09/04/2015 -On 09/04/2015 lab showing an upward trend in her white count to 21,700 from 13,800. Lactic acid within normal limits at 1.2. Her procalcitonin had trended down to 4.47 on 09/03/2015. She continued to appear ill, however, and with her elevated white  -Repeat CT scan of lungs with IV contrast performed on 09/04/2015 showed improved aeration of left upper lobe compared to prior study although continues to have findings consistent with pneumonia. Study did not reveal evidence of loculation. -Repeat blood cultures from 09/03/2015 showed no growth to date -On 09/07/2015 patient with significant clinical improvement. She was awake, alert, interactive, afebrile and nontoxic appearing. Transferred out of stepdown unit to telemetry. -Plan for a total of 14 days of effective antimicrobial therapy. Plan for discharge today on 3 more days of Levaquin.   2. Sepsis  -Present on admission, evidenced by a white count of 17,300, lactic acid of 2.65, respiratory rate of 28, heart rate of 118, having positive blood cultures for Streptococcus pneumoniae. -Source of infection likely community-acquired pneumonia. -Patient showing gradual improvement over the past 24 hours. -Repeat x-ray showing slight improvement to left-sided  infiltrates. -Susceptibility testing from blood cultures reviewed, organism is susceptible to cephalosporins. Narrowed antibiotic regimen to ceftriaxone 2 g IV every 24 hours and discontinued vancomycin, clindamycin, Flagyl. -Repeat blood cultures drawn on 09/03/2015 showing no growth to date. -She was transitioned to Levaquin 750 mg PO q daily on 09/08/2015 will be treated for total of 14 days of antibiotic treatment.  3. Acute hypoxemic hypercarbic respiratory failure -Present on admission, as she presented in respiratory distress, had a respiratory rate of 40 and required BiPAP.  -Likely secondary to community-acquired pneumonia as x-ray revealed large left-sided infiltrate. -Transthoracic echocardiogram did not reveal evidence for CHF. -Improving clinically.  -BiPAP changed to PRN. Has not required BiPAP further. -By 09/07/2015 she was showing significant clinical improvement, satting mid 90s on 2 L supplemental oxygen. -On 09/09/2015 she ambulated down the hallway to nurses station and back  4. Dyslipidemia. -Continue statin therapy  5. Hypokalemia. -Potassium levels improved to 3.7 after receiving replacement.  6. Chronic pain Patient's chronic pain currently being managed with Flexeril as needed, oxycodone as needed,as well as Ultram as needed. Patient asking for a few pills until she is able to be seen at the pain clinic. Will discharge patient with a prescription of oxycodone 5 mg every 4 hours as needed for pain, dispense #20 tablets until patient is seen at the pain clinic.  Code Status: full Family Communication: Updated patient. No family at bedside. Disposition Plan: Discharge home   Consultants:  PCCM admission  Procedures:  CT chest 09/01/2015, 09/04/2015  Chest x-ray 09/01/2015, 09/02/2015, 09/03/2015, 09/04/2015, 09/06/2015, 09/08/2015   2-D echo 09/05/2015  Antibiotics: 2. Ceftriaxone 12 g IV every 24 hours (started on 09/03/2015) 3. Vancomycin stopped  on 09/03/2015 4. Clindamycin stopped on 09/03/2015 5. Flagyl stopped on 09/03/2015 6. Transitoned to Levaquin on 09/08/2015 7.   HPI/Subjective: Patient  states she's feeling better. Patient states shortness of breath has improved. Patient asking to be bridged with some pain medication prior to her going back to the pain clinic on discharge.  Objective: Filed Vitals:   09/09/15 2042 09/10/15 0636  BP: 124/68 132/61  Pulse: 92 84  Temp: 99 F (37.2 C) 98.9 F (37.2 C)  Resp: 20 18    Intake/Output Summary (Last 24 hours) at 09/10/15 1135 Last data filed at 09/10/15 0939  Gross per 24 hour  Intake  1600 ml  Output  950 ml  Net  650 ml   Filed Weights   09/08/15 0531 09/09/15 0513 09/10/15 0636  Weight: 85.911 kg (189 lb 6.4 oz) 87.952 kg (193 lb 14.4 oz) 87.499 kg (192 lb 14.4 oz)    Exam:   General: NAD  Cardiovascular: RRR  Respiratory: CTAB  Abdomen: Soft, nontender, nondistended, positive bowel sounds.  Musculoskeletal: No clubbing cyanosis. Trace- 1+ BLE edema  Data Reviewed: Basic Metabolic Panel:  Last Labs      Recent Labs Lab 09/04/15 0615 09/04/15 1609 09/05/15 1604 09/06/15 0226 09/08/15 0535  NA 138 --  139 139 139  K 3.6 --  5.2* 4.6 4.4  CL 105 --  105 105 101  CO2 23 --  26 26 32  GLUCOSE 68 --  89 115* 90  BUN 9 --  10 9 10   CREATININE 0.53 --  0.59 0.60 0.61  CALCIUM 8.2* --  7.8* 7.9* 8.3*  MG --  1.8 --  --  --   PHOS --  5.1* --  --  --      Liver Function Tests:  Last Labs     No results for input(s): AST, ALT, ALKPHOS, BILITOT, PROT, ALBUMIN in the last 168 hours.    Last Labs     No results for input(s): LIPASE, AMYLASE in the last 168 hours.    Last Labs     No results for input(s): AMMONIA in the last 168 hours.   CBC:  Last Labs      Recent Labs Lab 09/04/15 0615 09/05/15 1604  09/06/15 0226 09/08/15 0535  WBC 21.7* 23.1* 16.6* 13.4*  HGB 13.6 11.8* 10.7* 12.4  HCT 41.9 36.4 35.5* 40.0  MCV 90.1 92.2 93.9 93.7  PLT 169 164 184 281     Cardiac Enzymes:  Last Labs     No results for input(s): CKTOTAL, CKMB, CKMBINDEX, TROPONINI in the last 168 hours.   BNP (last 3 results)  Recent Labs (within last 365 days)     Recent Labs  09/04/15 1609  BNP 252.8*      ProBNP (last 3 results)  Recent Labs (within last 365 days)    No results for input(s): PROBNP in the last 8760 hours.    CBG:  Last Labs     No results for input(s): GLUCAP in the last 168 hours.    Recent Results (from the past 240 hour(s))  Blood Culture (routine x 2) Status: None   Collection Time: 09/01/15 3:19 AM  Result Value Ref Range Status   Specimen Description BLOOD RIGHT HAND  Final   Special Requests BOTTLES DRAWN AEROBIC AND ANAEROBIC 6CC  Final   Culture Setup Time   Final    GRAM POSITIVE COCCI IN PAIRS RECOVERED FROM BOTH BOTTLES Gram Stain Report Called to,Read Back By and Verified With: BEABRAUT,J. AT 1656 ON 09/01/2015 BY BAUGHAM,M. Performed at [facility]    Culture   Final  STREPTOCOCCUS PNEUMONIAE Performed at [facility]    Report Status 09/03/2015 FINAL  Final   Organism ID, Bacteria STREPTOCOCCUS PNEUMONIAE  Final   Susceptibility   Streptococcus pneumoniae - MIC*    ERYTHROMYCIN <=0.12 SENSITIVE Sensitive     LEVOFLOXACIN 1 SENSITIVE Sensitive     VANCOMYCIN 0.5 SENSITIVE Sensitive     PENICILLIN <=0.06 SENSITIVE Sensitive     CEFTRIAXONE <=0.12 SENSITIVE Sensitive    * STREPTOCOCCUS PNEUMONIAE  Blood Culture (routine x 2) Status: None   Collection Time: 09/01/15 3:35 AM  Result Value Ref Range Status   Specimen Description BLOOD LEFT HAND  Final   Special Requests BOTTLES DRAWN AEROBIC ONLY  5CC  Final   Culture Setup Time   Final    CORRECTED RESULTS GRAM POSITIVE COCCI IN PAIRS CORRECTED RESULTS CALLED TO: ROBBINS,L. ([facility]) AT 1100 ON 09/02/2015 BY BAUGHAM,M. Gram Stain Report Called to,Read Back By and Verified With: PREVIOUSLY TO BEABRAUT,J. AT 1656 ON 08/31/2014 BY BAUGHAM,M. Performed at [facility]    Culture   Final    STREPTOCOCCUS PNEUMONIAE SUSCEPTIBILITIES PERFORMED ON PREVIOUS CULTURE WITHIN THE LAST 5 DAYS. Performed at [facility]    Report Status 09/03/2015 FINAL  Final  Urine culture Status: None   Collection Time: 09/01/15 4:00 AM  Result Value Ref Range Status   Specimen Description URINE, CATHETERIZED  Final   Special Requests NONE  Final   Culture   Final    NO GROWTH 1 DAY Performed at [facility]    Report Status 09/02/2015 FINAL  Final  MRSA PCR Screening Status: Abnormal   Collection Time: 09/01/15 7:25 AM  Result Value Ref Range Status   MRSA by PCR POSITIVE (A) NEGATIVE Final    Comment:   The GeneXpert MRSA Assay (FDA approved for NASAL specimens only), is one component of a comprehensive MRSA colonization surveillance program. It is not intended to diagnose MRSA infection nor to guide or monitor treatment for MRSA infections. RESULT CALLED TO, READ BACK BY AND VERIFIED WITH: BEABRAUT RN 10:00 09/01/15 ([email or web address])   Culture, blood (Routine X 2) w Reflex to ID Panel Status: None   Collection Time: 09/03/15 2:45 PM  Result Value Ref Range Status   Specimen Description BLOOD LEFT ANTECUBITAL  Final   Special Requests IN PEDIATRIC BOTTLE 2CC  Final   Culture NO GROWTH 5 DAYS  Final   Report Status 09/08/2015 FINAL  Final  Culture, blood (Routine X 2) w Reflex to ID Panel Status: None   Collection Time: 09/03/15 2:52 PM  Result Value Ref Range Status   Specimen Description BLOOD LEFT  HAND  Final   Special Requests IN PEDIATRIC BOTTLE .5CC  Final   Culture NO GROWTH 5 DAYS  Final   Report Status 09/08/2015 FINAL  Final     Studies:  Imaging Results (Last 48 hours)    No results found.    Scheduled Meds: . antiseptic oral rinse 7 mL Mouth Rinse BID  . atorvastatin 20 mg Oral q1800  . buPROPion 150 mg Oral Daily  . DULoxetine 60 mg Oral Daily  . fluconazole (DIFLUCAN) IV 100 mg Intravenous Q24H  . heparin subcutaneous 5,000 Units Subcutaneous 3 times per day  . levofloxacin 750 mg Oral Q1200  . nicotine 7 mg Transdermal Daily  . olopatadine 1 drop Both Eyes BID  . pantoprazole 40 mg Oral Daily   Continuous Infusions:   Principal Problem:  CAP (community acquired pneumonia) Active Problems:  Sepsis (  HCC)  Respiratory failure, acute (HCC)  Acute respiratory failure with hypoxia (HCC)  Acute respiratory failure (HCC)  Chronic obstructive pulmonary disease with acute lower respiratory infection (HCC)    Time spent: 35 mins    [facility] HospitalHOMPSON,DANIELMD [facility] Pager [phone]. If 7PM-7AM, please contact night-coverage at [email or web address], password [email or web address] 09/10/2015, 11:35 AM  LOS: 9 days

## 2015-09-11 ENCOUNTER — Other Ambulatory Visit: Payer: Self-pay

## 2015-09-11 ENCOUNTER — Telehealth: Payer: Self-pay | Admitting: *Deleted

## 2015-09-11 ENCOUNTER — Other Ambulatory Visit: Payer: Self-pay | Admitting: *Deleted

## 2015-09-11 NOTE — Patient Outreach (Signed)
09/11/15- Telephone call to patient for transition of care week 1, no answer to telephone, left voicemail requesting return phone call.  Irving ShowsJulie Lela Gell [facility], BSN [facility] Community Care Coordinator [phone]

## 2015-09-12 ENCOUNTER — Other Ambulatory Visit: Payer: Self-pay | Admitting: *Deleted

## 2015-09-12 NOTE — Telephone Encounter (Signed)
Call Completed and Appointment Scheduled: Yes, Date: 09/19/15 with Bennie PieriniMary Margaret Martin, FNP   DISCHARGE INFORMATION Date of Discharge:09/10/15  Discharge Facility: [facility]  Principal Discharge Diagnosis: Pneumonia and sepsis  Patient and/or caregiver is knowledgeable of his/her condition(s) and treatment: Yes   MEDICATION RECONCILIATION Medication list reviewed with patient: Yes  Patient is able to obtain needed medications: Yes   ACTIVITIES OF DAILY LIVING  Is the patient able to perform his/her own ADLs: Yes  Patient is receiving home health services: yes Nurse is coming twice weekly. [facility]   PATIENT EDUCATION Questions/Concerns Discussed: Patient had a fall in her home prior to home health nurse arrival today. Nurse assessed her and took a picture of her foot which was swollen. She offered to call our office for an appointment but patient declined. She has been given a walker and will use that to decrease risk for falls. For now she is going to elevate the foot and rest. She has no concerns or questions about her hospital stay or diagnosis of pneumonia. No problems taking Levaquin or Prednisone. She will follow up sooner than scheduled appt if necessary.

## 2015-09-12 NOTE — Patient Outreach (Signed)
09/12/15- Telephone call to patient for transition of care week 1 (2nd attempt), no answer to telephone and no option to leave voicemail.  Irving ShowsJulie Keeara Frees [facility] CenterRNC, BSN [facility] Community Care Coordinator [phone]

## 2015-09-15 ENCOUNTER — Encounter: Payer: Self-pay | Admitting: *Deleted

## 2015-09-15 ENCOUNTER — Other Ambulatory Visit: Payer: Self-pay | Admitting: *Deleted

## 2015-09-15 NOTE — Patient Outreach (Signed)
[facility] Care Management  09/15/2015  Bethany Villa 02/26/1956 [id number]   Patient triggered RED on EMMI Pneumonia Dashboard, notification sent to Irving ShowsJulie Farmer, RN.  Thanks, Corrie MckusickLisa O. Sharlee BlewMoore, AABA [facility] Care Management [facility] CM Assistant Phone: [phone] Fax: [phone]

## 2015-09-15 NOTE — Patient Outreach (Signed)
09/15/15- Telephone call to patient for transition of care week 1, spoke with pt, HIPAA verified, pt states she has transportation provided by her sister in law, has home health in place, does continue smoking 1/2 pack per day and not interested smoking cessation at present.  RN CM questioned pt about EMMI pneumonia questions red flag from 09/13/15 and pt states "the answers are not true, that thing is messed up and I don't want it"  RN CM sent in basket to Nena Polio requesting EMMI pneumonia outreach stop per pt request.  RN CM faxed note to primary MD reporting pt is in transition of care program, faxed today's note.  Outpatient Encounter Prescriptions as of 09/15/2015  Medication Sig  . albuterol (PROVENTIL) (2.5 MG/3ML) 0.083% nebulizer solution TAKE 3 ML BY NEBULIZATION Q 6 hrs prn FOR WHEEZING OR SOB Dx J44.9  . atorvastatin (LIPITOR) 20 MG tablet TAKE 1 TABLET (20 MG TOTAL) BY MOUTH DAILY.  Marland Kitchen buPROPion (WELLBUTRIN XL) 150 MG 24 hr tablet TAKE 1 TABLET (150 MG TOTAL) BY MOUTH DAILY.  Marland Kitchen diclofenac (VOLTAREN) 75 MG EC tablet Take 1 tablet (75 mg total) by mouth 2 (two) times daily as needed.  . diclofenac sodium (VOLTAREN) 1 % GEL Apply 2 g topically 4 (four) times daily.  . DULoxetine (CYMBALTA) 60 MG capsule TAKE 1 CAPSULE (60 MG TOTAL) BY MOUTH DAILY.  . fluticasone (FLONASE) 50 MCG/ACT nasal spray Place 2 sprays into both nostrils daily.  . Fluticasone Furoate-Vilanterol 100-25 MCG/INH AEPB Inhale 1 puff into the lungs daily.  Marland Kitchen levalbuterol (XOPENEX HFA) 45 MCG/ACT inhaler Inhale 2 puffs into the lungs every 8 (eight) hours as needed for wheezing or shortness of breath.  . levofloxacin (LEVAQUIN) 750 MG tablet Take 1 tablet (750 mg total) by mouth daily.  Marland Kitchen omeprazole (PRILOSEC) 20 MG capsule TAKE 1 CAPSULE (20 MG TOTAL) BY MOUTH DAILY.  Marland Kitchen oxyCODONE (OXY IR/ROXICODONE) 5 MG immediate release tablet Take 1 tablet (5 mg total) by mouth every 4 (four) hours as needed for moderate pain or severe  pain.  Marland Kitchen PATANOL 0.1 % ophthalmic solution PLACE 1 DROP INTO BOTH EYES 2 (TWO) TIMES DAILY.  Marland Kitchen predniSONE (STERAPRED UNI-PAK 21 TAB) 10 MG (21) TBPK tablet Take 3-2-1 tablets by mouth daily till gone.  . promethazine (PHENERGAN) 12.5 MG tablet Take 12.5 mg by mouth every 6 (six) hours as needed for nausea or vomiting.  . VENTOLIN HFA 108 (90 BASE) MCG/ACT inhaler INHALE 2 PUFFS INTO THE LUNGS DAILY AS NEEDED FOR WHEEZING OR SHORTNESS OF BREATH.   No facility-administered encounter medications on file as of 09/15/2015.    [facility] CM Care Plan Problem One        Most Recent Value   Care Plan Problem One  High risk for readmission related to COPD   Role Documenting the Problem One  Care Management Coordinator   Care Plan for Problem One  Active   [facility] Long Term Goal (31-90 days)  pt will have no readmissions related to COPD within 90 days.   [facility] Long Term Goal Start Date  09/15/15   Interventions for Problem One Long Term Goal  RN CM reviewed importance of symptom management and calling early for change in health status, reviewed resources such as home health, MD, RN CM and 911 if needed.   [facility] CM Short Term Goal #1 (0-30 days)  pt will attend primary care MD appointment on 09/19/15 within one week   [facility] CM Short Term Goal #1  Start Date  09/15/15   Interventions for Short Term Goal #1  RN CM reviewed importance of attending all MD appointments, take all medications to appointment.      Irving ShowsJulie Farmer [facility] HospitalRNC, BSN [facility] Community Care Coordinator [phone]

## 2015-09-19 ENCOUNTER — Encounter: Payer: Self-pay | Admitting: Nurse Practitioner

## 2015-09-19 ENCOUNTER — Ambulatory Visit (INDEPENDENT_AMBULATORY_CARE_PROVIDER_SITE_OTHER): Payer: Medicare Other | Admitting: Nurse Practitioner

## 2015-09-19 VITALS — BP 119/84 | HR 110 | Temp 96.9°F | Ht 64.0 in | Wt 186.0 lb

## 2015-09-19 DIAGNOSIS — Z09 Encounter for follow-up examination after completed treatment for conditions other than malignant neoplasm: Secondary | ICD-10-CM

## 2015-09-19 DIAGNOSIS — G894 Chronic pain syndrome: Secondary | ICD-10-CM | POA: Diagnosis not present

## 2015-09-19 NOTE — Progress Notes (Signed)
   Subjective:    Patient ID: Bethany Villa, female    DOB: 08/12/1956, 60 y.o.   MRN: [id number]  HPI Patient in today for transitional care- she was discharged from hospital from pneumonia on 09/10/15. She says that she is doing much better- still has low energy- no fever or cough- she has a home health nurse coming 2x a week to check her blood pressure and breathing.Francis Dowse does say that she has fallen twice since she has been home- her left knee gave way with her ( she is suppose to have a total knee replacement sometime this year ). She denies any injury from fall.  * Patient use to be patient at pain clinic for chronic pain disorder- had drug screen with valium in it so they discharged her from pain clinic.  Review of Systems  Constitutional: Negative.   HENT: Negative.   Respiratory: Negative.   Cardiovascular: Negative.   Genitourinary: Negative.   Neurological: Negative.   Psychiatric/Behavioral: Negative.   All other systems reviewed and are negative.      Objective:   Physical Exam  Constitutional: She is oriented to person, place, and time. She appears well-developed and well-nourished.  Cardiovascular: Normal rate and normal heart sounds.   Pulmonary/Chest: Effort normal. She has wheezes (faint exp wheezes anteriorly).  Neurological: She is alert and oriented to person, place, and time.  Skin: Skin is warm.  Psychiatric: She has a normal mood and affect. Her behavior is normal. Judgment and thought content normal.    BP 119/84 mmHg  Pulse 110  Temp(Src) 96.9 F (36.1 C) (Oral)  Ht  (1.626 m)  Wt 186 lb (84.369 kg)  BMI 31.91 kg/m2       Assessment & Plan:  1. Hospital discharge follow-up Transitional care- no changes to meds  2. Chronic pain syndrome Patient will make appointment to discuss pain management- she  Will bring copy of MRI report to review at that visist- patient says that she cannot go back to pain clinic because she does not have a car to  drive to get to appointments and that she lives across the street from our office and she can easily get here.  Mary-Margaret Daphine Deutscher, FNP

## 2015-09-22 ENCOUNTER — Ambulatory Visit (INDEPENDENT_AMBULATORY_CARE_PROVIDER_SITE_OTHER): Payer: Medicare Other | Admitting: Family

## 2015-09-22 ENCOUNTER — Encounter: Payer: Self-pay | Admitting: Family

## 2015-09-22 VITALS — BP 98/68 | HR 101 | Temp 98.1°F | Ht 64.0 in | Wt 187.0 lb

## 2015-09-22 DIAGNOSIS — G894 Chronic pain syndrome: Secondary | ICD-10-CM | POA: Diagnosis not present

## 2015-09-22 DIAGNOSIS — M797 Fibromyalgia: Secondary | ICD-10-CM | POA: Diagnosis not present

## 2015-09-22 NOTE — Patient Instructions (Signed)

## 2015-09-22 NOTE — Progress Notes (Signed)
   Subjective:    Patient ID: Bethany Villa, female    DOB: [birth year], 60 y.o.   MRN: [id number]  HPI PT presents to the office to discuss pain management. PT states she has lost her license and can not drive any more.PT states she was going to a Pain Clinic that was giving her hydromorphone 4 mg for 120 tabs a month for Chronic back pain, fibromyalgia,  and chronic pain disorder. PT states she can not drive to the pain clinic anymore and has to depend on her sister to drive her to her appointments. Pt states without the hydromorphone she can not stand or do daily living activities. PT is followed by Bennie Pierini, FNP for all of her chronic follow up.    Review of Systems  Constitutional: Negative.   HENT: Negative.   Eyes: Negative.   Respiratory: Negative.  Negative for shortness of breath.   Cardiovascular: Negative.  Negative for palpitations.  Gastrointestinal: Negative.   Endocrine: Negative.   Genitourinary: Negative.   Musculoskeletal: Positive for back pain and gait problem.  Neurological: Negative.  Negative for headaches.  Hematological: Negative.   Psychiatric/Behavioral: Negative.   All other systems reviewed and are negative.      Objective:   Physical Exam  Constitutional: She is oriented to person, place, and time. She appears well-developed and well-nourished. No distress.  HENT:  Head: Normocephalic and atraumatic.  Eyes: Pupils are equal, round, and reactive to light.  Neck: Normal range of motion. Neck supple. No thyromegaly present.  Cardiovascular: Normal rate, regular rhythm, normal heart sounds and intact distal pulses.   No murmur heard. Pulmonary/Chest: Effort normal. No respiratory distress. She has no wheezes.  Diminished breath sounds   Abdominal: Soft. Bowel sounds are normal. She exhibits no distension. There is no tenderness.  Musculoskeletal: Normal range of motion. She exhibits no edema or tenderness.  PT using walker  Neurological:  She is alert and oriented to person, place, and time. She has normal reflexes. No cranial nerve deficit.  Skin: Skin is warm and dry.  Psychiatric: She has a normal mood and affect. Her behavior is normal. Judgment and thought content normal.  Vitals reviewed.     BP 98/68 mmHg  Pulse 101  Temp(Src) 98.1 F (36.7 C) (Oral)  Ht  (1.626 m)  Wt 187 lb (84.823 kg)  BMI 32.08 kg/m2     Assessment & Plan:  1. Fibromyalgia  2. Chronic pain disorder  Pt told she needed to follow up with Gennette Pac FNP for pain management. Pt told provider may not prescribe pain medications, but provider may want to send her to Pain Management.   Jannifer Rodney, FNP

## 2015-09-22 NOTE — Progress Notes (Signed)
Result Narrative  TECHNIQUE: MRI of the lumbar spine was performed without contrast.   FINDINGS: There is minimal degenerative type retrolisthesis of L2 on L3  and L3 on L4. Otherwise posterior bony alignment is normal. Marrow signal  is normal. Conus medullaris is at L1-L2 and is normal. Minimal ridging of  the lower thoracic and upper lumbar discs on sagittal images but there is  no significant posterior disc abnormalities and no central, lateral  recess or foraminal stenosis.   L3-L4: Mild circumferential disc bulge show some flattening of posterior  disc margin and protrusion into both neuroforamen and beyond. There is  some contact of the exiting L3 roots bilaterally beyond the neuroforamen.  Hypertrophic facets with bilateral facet effusions. Focal narrowing of  lateral recesses crowding the L4 roots. No central stenosis. Neuroforamen  are patent.   L4-L5: Disc is essentially normal for this level. Mild facet hypertrophy  some thickening ligamentum flavum. Slight crowding of the lateral  recesses with adjacency to the L5 roots but no definite root compression.  Neuroforamen are patent.   L5-S1: No significant disc bulge or protrusion. Hypertrophic and  degenerative facets left greater than right. Buckling of ligamentum  flavum on the left contributing to left lateral recess and left foraminal  stenosis affecting both the exiting L5 root in the traversing S1 root.   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 Lesser degree of lateral recess narrowing, left greater than right  L3-L4 and L4-L5.   ___________________________________________________________ Current Report Read byMarland Kitchen  Neal Dy  [id number] on May 14 2011  4:28P Transcribed byJenetta Downer   on May 14 2011  4:40P Electronically Signed by:  DR. Neal Dy on:  May 14 2011  4:40P   Status Results Details   Encounter Summary  2011 June

## 2015-09-23 ENCOUNTER — Telehealth: Payer: Self-pay | Admitting: Nurse Practitioner

## 2015-09-23 ENCOUNTER — Encounter: Payer: Self-pay | Admitting: *Deleted

## 2015-09-23 ENCOUNTER — Other Ambulatory Visit: Payer: Self-pay | Admitting: Nurse Practitioner

## 2015-09-23 ENCOUNTER — Other Ambulatory Visit: Payer: Self-pay | Admitting: *Deleted

## 2015-09-23 NOTE — Patient Outreach (Signed)
09/23/15- Telephone call to Fredia Beets RN at [facility], reported today's blood pressure for patient is  88/54, pulse 105, ask primary RN assigned through home health to monitor, informed that MD is aware.  Irving Shows [facility], BSN [facility] Community Care Coordinator [phone]

## 2015-09-23 NOTE — Patient Outreach (Signed)
[facility]) Care Management   09/23/2015  Bethany Villa 1956-06-22 [id number]  Bethany Villa is an 60 y.o. female  Subjective: Initial home visit with pt, HIPAA verified, pt reports she has chronic pain management issues and is currently working with her doctor regarding changes with pain medication, pt states she hopes to have knee replacement this year.  Pt refused home health PT due to "unable to exercise or do much at all".  Home health RN sees pt weekly.  When questioned, pt reports she is not sure about her nebulizer machine being serviced and not sure where to get the tubing, etc.  Pt states her sister in law and friends assist her with grocery shopping, etc.  Objective:   Filed Vitals:   09/23/15 1212  BP: 88/54  Pulse: 105  Resp: 18  Height: 1.626 m ('5\' 4"' )  Weight: 187 lb (84.823 kg)  SpO2: 95%   ROS  Physical Exam  Constitutional: She is oriented to person, place, and time. She appears well-developed and well-nourished.  HENT:  Head: Normocephalic.  Neck: Normal range of motion. Neck supple.  Cardiovascular: Regular rhythm.   HR 105  Respiratory: Effort normal.  bil breath sounds diminished  GI: Soft. Bowel sounds are normal.  Musculoskeletal: Normal range of motion. She exhibits edema.  1+ edema to left foot  Neurological: She is alert and oriented to person, place, and time.  Skin: Skin is dry.  Mottled pinkish red noted to left foot, pt says this is chronic  Psychiatric: She has a normal mood and affect. Her behavior is normal. Judgment and thought content normal.    Current Medications:   Current Outpatient Prescriptions  Medication Sig Dispense Refill  . albuterol (PROVENTIL) (2.5 MG/3ML) 0.083% nebulizer solution TAKE 3 ML BY NEBULIZATION Q 6 hrs prn FOR WHEEZING OR SOB Dx J44.9 75 mL 3  . atorvastatin (LIPITOR) 20 MG tablet TAKE 1 TABLET (20 MG TOTAL) BY MOUTH DAILY. 90 tablet 1  . buPROPion (WELLBUTRIN XL) 150 MG 24 hr tablet TAKE 1  TABLET (150 MG TOTAL) BY MOUTH DAILY. 30 tablet 5  . cyclobenzaprine (FLEXERIL) 10 MG tablet TAKE 1 TABLET (10 MG TOTAL) BY MOUTH 3 (THREE) TIMES DAILY AS NEEDED FOR MUSCLE SPASMS.  0  . diclofenac (VOLTAREN) 75 MG EC tablet Take 1 tablet (75 mg total) by mouth 2 (two) times daily as needed. 30 tablet 1  . diclofenac sodium (VOLTAREN) 1 % GEL Apply 2 g topically 4 (four) times daily. 100 g 3  . DULoxetine (CYMBALTA) 60 MG capsule TAKE 1 CAPSULE (60 MG TOTAL) BY MOUTH DAILY. 30 capsule 5  . fluticasone (FLONASE) 50 MCG/ACT nasal spray Place 2 sprays into both nostrils daily. 16 g 1  . omeprazole (PRILOSEC) 20 MG capsule TAKE 1 CAPSULE (20 MG TOTAL) BY MOUTH DAILY. 90 capsule 1  . PATANOL 0.1 % ophthalmic solution PLACE 1 DROP INTO BOTH EYES 2 (TWO) TIMES DAILY. 15 mL 0  . VENTOLIN HFA 108 (90 BASE) MCG/ACT inhaler INHALE 2 PUFFS INTO THE LUNGS DAILY AS NEEDED FOR WHEEZING OR SHORTNESS OF BREATH. 18 Inhaler 2  . cloNIDine (CATAPRES) 0.1 MG tablet Take 0.1 mg by mouth 2 (two) times daily. Reported on 09/23/2015  0  . Fluticasone Furoate-Vilanterol 100-25 MCG/INH AEPB Inhale 1 puff into the lungs daily. (Patient not taking: Reported on 09/23/2015) 14 each 0  . furosemide (LASIX) 40 MG tablet Reported on 09/23/2015    . levalbuterol (XOPENEX HFA) 45 MCG/ACT  inhaler Inhale 2 puffs into the lungs every 8 (eight) hours as needed for wheezing or shortness of breath. (Patient not taking: Reported on 09/23/2015) 1 Inhaler 12  . oxyCODONE (OXY IR/ROXICODONE) 5 MG immediate release tablet Take 1 tablet (5 mg total) by mouth every 4 (four) hours as needed for moderate pain or severe pain. (Patient not taking: Reported on 09/23/2015) 20 tablet 0  . promethazine (PHENERGAN) 12.5 MG tablet Take 12.5 mg by mouth every 6 (six) hours as needed for nausea or vomiting. Reported on 09/23/2015    . topiramate (TOPAMAX) 50 MG tablet Reported on 09/23/2015  1   No current facility-administered medications for this visit.     Functional Status:   In your present state of health, do you have any difficulty performing the following activities: 09/23/2015 09/03/2015  Hearing? N N  Vision? N N  Difficulty concentrating or making decisions? N Y  Walking or climbing stairs? Y Y  Dressing or bathing? N N  Doing errands, shopping? Tempie Donning  Preparing Food and eating ? N -  Using the Toilet? N -  In the past six months, have you accidently leaked urine? N -  Do you have problems with loss of bowel control? N -  Managing your Medications? N -  Managing your Finances? N -  Housekeeping or managing your Housekeeping? Y -    Fall/Depression Screening:    PHQ 2/9 Scores 09/23/2015 09/22/2015 10/24/2014  PHQ - 2 Score 0 0 4  PHQ- 9 Score - - 9   Fall Risk  09/23/2015 09/22/2015 10/24/2014  Falls in the past year? Yes Yes Yes  Number falls in past yr: 2 or more 1 1  Injury with Fall? No No Yes  Risk Factor Category  High Fall Risk - -  Risk for fall due to : History of fall(s);Medication side effect - -  Follow up Education provided;Falls prevention discussed - -    Assessment:  RN CM called primary Ronnald Collum FNP, spoke with Mitzi, reported today's blood pressure, pulse rate and that pt has MD visits noted in epic of elevated pulse and low blood pressure, reported pt not taking blood pressure medication.  RN CM faxed today's visit note as well as barrier letter, gave pt EMMI handouts, [facility] calendar and Nondiscrimination and Accessibility statement.    THN CM Care Plan Problem One        Most Recent Value   Care Plan Problem One  High risk for readmission related to COPD   Role Documenting the Problem One  Care Management [place] for Problem One  Active   THN Long Term Goal (31-90 days)  pt will have no readmissions related to COPD within 90 days.   THN Long Term Goal Start Date  09/15/15   Interventions for Problem One Long Term Goal  RN CM reinforced importance of symptom management and calling early for  change in health status, reviewed resources such as home health, MD, RN CM and 911 if needed., gave 24 hour nurse line magnet for refrigerator.   THN CM Short Term Goal #1 (0-30 days)  pt will attend primary care MD appointment on 09/19/15 within one week   [facility] CM Short Term Goal #1 Start Date  09/15/15   [facility] CM Short Term Goal #1 Met Date  09/23/15 [pt attended appointment]   Interventions for Short Term Goal #1  RN CM reinforced importance of attending all MD appointments, take all medications to  appointment.   THN CM Short Term Goal #2 (0-30 days)  Pt will verbalize COPD zones within 30 days   THN CM Short Term Goal #2 Start Date  09/23/15   Interventions for Short Term Goal #2  RN CM gave pt [facility] calendar and reviewed COPD zones listed inside the calendar, ask pt to read over information.   THN CM Short Term Goal #3 (0-30 days)  Pt or family member will pickup nebulizer kit from [place] within 30 days.   THN CM Short Term Goal #3 Start Date  09/23/15   Interventions for Short Tern Goal #3  RN CM called [employer], spoke with Sharee Pimple who reports they are not servicing patient's nebulizer now that she owns it since 06/15/15, would be 75$ charge, Sharee Pimple states pt should now be coming in monthly to obtain nebulizer kit which insurance will reimburse for and bring nebulizer in if there are any issues with machine, pt should be checking filter monthly (which pt states she can do).    THN CM Care Plan Problem Two        Most Recent Value   Care Plan Problem Two  Difficulty managing chronic pain   Role Documenting the Problem Two  Care Management Coordinator   Care Plan for Problem Two  Active   THN CM Short Term Goal #1 (0-30 days)  Pt will discuss pain managment issues with MD at upcoming appointment within 30 days   THN CM Short Term Goal #1 Start Date  09/23/15   Interventions for Short Term Goal #2   RN CM reviewed importance of working with primary MD on pain control, pt is to  discuss pain medication at visit this Friday.      Plan: Continue with weekly transition of care calls Follow up with home visit 10/16/15 Continue COPD teaching, reinforcement action plan Assess pain Assess blood pressure, pulse- ask if any changes made.  Jacqlyn Larsen Ku [facility], [place] Coordinator [phone]

## 2015-09-23 NOTE — Telephone Encounter (Signed)
Clonidine can be used for other things as well but will remove from medication list

## 2015-09-26 ENCOUNTER — Ambulatory Visit (INDEPENDENT_AMBULATORY_CARE_PROVIDER_SITE_OTHER): Payer: Medicare Other | Admitting: Nurse Practitioner

## 2015-09-26 ENCOUNTER — Encounter: Payer: Self-pay | Admitting: Nurse Practitioner

## 2015-09-26 VITALS — BP 114/86 | HR 103 | Temp 96.5°F | Ht 64.0 in | Wt 185.0 lb

## 2015-09-26 DIAGNOSIS — Z7189 Other specified counseling: Secondary | ICD-10-CM

## 2015-09-26 DIAGNOSIS — G894 Chronic pain syndrome: Secondary | ICD-10-CM

## 2015-09-26 NOTE — Progress Notes (Signed)
Subjective:    Patient ID: Bethany Villa, female    DOB: [birth year], 60 y.o.   MRN: [id number]  HPI Patient comes in today for initial pain management. Se was discharge from pain management because she had valium in her system which she was not suppose to have. SHe says pain is intolerable and she needs pain meds- when with pain management she was on dilaudid . She has not had that since November.  She was told will have to have drug screen today- wanted up to know that she just got out of hospital and she may have some xanax in her system- she got some from her sister in law. She took her last oxycodone a week ago. Then patient starts ranbling on that she is not sure when she took her last one- she says that her memory is just not that good.   [place] Controlled Substance Abuse database reviewed- Yes  Depression screen [facility] 2/9 09/26/2015 09/23/2015 09/22/2015 10/24/2014  Decreased Interest 0 0 0 2  Down, Depressed, Hopeless 0 0 0 2  PHQ - 2 Score 0 0 0 4  Altered sleeping - - - 3  Tired, decreased energy - - - 2  Change in appetite - - - 0  Feeling bad or failure about yourself  - - - 0  Trouble concentrating - - - 0  Moving slowly or fidgety/restless - - - 0  Suicidal thoughts - - - 0  PHQ-9 Score - - - 9    GAD 7 : Generalized Anxiety Score 09/26/2015  Nervous, Anxious, on Edge 1  Control/stop worrying 0  Worry too much - different things 0  Trouble relaxing 0  Restless 0  Easily annoyed or irritable 0  Afraid - awful might happen 0  Total GAD 7 Score 1  Anxiety Difficulty Not difficult at all      [place] drug screen performed- Yes  SOAPP  0= never  1= seldom  2=sometimes  3= often  4= very often  How often do you have mood swings? 0 How often do you smoke a cigarette within an hour after waling up? 4 How often have you taken medication other than the way that it was prescribed?0 How often have you used illegal drugs in the past 5 years? 0 How often, in  your lifetime, have you had legal problems or been arrested? 0  Score 4  Alcohol Audit - How often during the last year have found that you: 0-Never   1- Less than monthly   2- Monthly     3-Weekly     4-daily or almost daily  - found that you were not able to stop drinking once you started- 0 -failed to do what was normally expected of you because of drinking- 0 -needed a first drink in the morning- 0 -had a feeling of guilt or remorse after drinking- 0 -are/were unable to remember what happened the night before because of your drinking- 0  0- NO   2- yes but not in last year  4- yes during last year -Have you or someone else been injured because of your drinking- 0 - Has anyone been concerned about your drinking or suggested you cut down- 0        TOTAL- 0  ( 0-7- alcohol education, 8-15- simple advice, 16-19 simple advice plus counseling, 20-40 referral for evaluation and treatment 0   Designated Pharmacy- [facility]  Pain assessment: Back pain Cause of pain-  DDD according to old MRI report Pain location- Low back Pain on scale of 1-10- 10/10 Frequency- constant What increases pain-standing in one place What makes pain Better-movement Effects on ADL - pain makes it take longer to perform ADL's  Prior treatments tried and failed- back surgery, lortab, ultram,  Current treatments- currently does not have anything but dilaudid worked the best Morphine mg equivalent- 0 right now  Pain management agreement reviewed and signed- Yes     Review of Systems  Constitutional: Negative.   HENT: Negative.   Respiratory: Negative.   Cardiovascular: Negative.   Genitourinary: Negative.   Neurological: Negative.   Psychiatric/Behavioral: Negative.   All other systems reviewed and are negative.      Objective:   Physical Exam  Constitutional: She is oriented to person, place, and time. She appears well-developed and well-nourished.  Cardiovascular: Normal rate, regular  rhythm and normal heart sounds.   Pulmonary/Chest: Effort normal and breath sounds normal.  Musculoskeletal:  Rose quickly from chair to get on exam table without any problems.  Has brace on left knee- knee not assessed Pain on palpation of lower back all the way across (-) SLR bil Motor strength and sensation distally intact  Neurological: She is alert and oriented to person, place, and time.  Skin: Skin is warm.  Psychiatric: She has a normal mood and affect. Her behavior is normal. Judgment and thought content normal.  Rambling at times When asked questions she would answer one way then would change her answer and say that she is confused and cannot remember.   BP 114/86 mmHg  Pulse 103  Temp(Src) 96.5 F (35.8 C) (Oral)  Ht  (1.626 m)  Wt 185 lb (83.915 kg)  BMI 31.74 kg/m2       Assessment & Plan:   1. Encounter for pain management counseling   2. Chronic pain disorder    Reviewed pain management contract allowed time for questions No pain meds until clear urine drug screen Will call patient with drug screen results MUST NOT TAKE ANYONE ELSE'S MEDS- Only meds rx by my self Will decide on follow up once drug screen is back  Mary-Margaret Daphine Deutscher, FNP

## 2015-09-30 ENCOUNTER — Ambulatory Visit: Payer: Self-pay | Admitting: *Deleted

## 2015-09-30 ENCOUNTER — Encounter (INDEPENDENT_AMBULATORY_CARE_PROVIDER_SITE_OTHER): Payer: Medicare Other | Admitting: Family Medicine

## 2015-09-30 ENCOUNTER — Other Ambulatory Visit: Payer: Self-pay | Admitting: *Deleted

## 2015-09-30 ENCOUNTER — Telehealth: Payer: Self-pay | Admitting: Nurse Practitioner

## 2015-09-30 ENCOUNTER — Encounter: Payer: Self-pay | Admitting: *Deleted

## 2015-09-30 DIAGNOSIS — J441 Chronic obstructive pulmonary disease with (acute) exacerbation: Secondary | ICD-10-CM | POA: Diagnosis not present

## 2015-09-30 DIAGNOSIS — G894 Chronic pain syndrome: Secondary | ICD-10-CM

## 2015-09-30 DIAGNOSIS — L989 Disorder of the skin and subcutaneous tissue, unspecified: Secondary | ICD-10-CM | POA: Diagnosis not present

## 2015-09-30 DIAGNOSIS — F329 Major depressive disorder, single episode, unspecified: Secondary | ICD-10-CM

## 2015-09-30 NOTE — Patient Outreach (Signed)
09/30/15- Patient called into [facility] office on 09/25/15 and spoke with Ria Clock and reported she does not want any further phone calls or visits citing she has too many nurses involved and wishes to be discharged from [facility] program, RN CM received written email from Ria Clock confirming this information. RN CM faxed case closure letter to primary MD office- Paulene Floor FNP RN CM mailed case closure letter to patient.  Irving Shows [facility], BSN [facility] Care Coordinator [phone]

## 2015-10-02 ENCOUNTER — Encounter: Payer: Self-pay | Admitting: *Deleted

## 2015-10-02 LAB — TOXASSURE SELECT 13 (MW), URINE: PDF: 0

## 2015-10-03 ENCOUNTER — Encounter: Payer: Medicare Other | Admitting: Nurse Practitioner

## 2015-10-06 ENCOUNTER — Encounter: Payer: Self-pay | Admitting: Nurse Practitioner

## 2015-10-07 ENCOUNTER — Other Ambulatory Visit: Payer: Self-pay | Admitting: Nurse Practitioner

## 2015-10-13 ENCOUNTER — Telehealth: Payer: Self-pay | Admitting: Nurse Practitioner

## 2015-10-16 ENCOUNTER — Ambulatory Visit: Payer: Medicare Other | Admitting: *Deleted

## 2015-11-07 ENCOUNTER — Other Ambulatory Visit: Payer: Self-pay | Admitting: Nurse Practitioner

## 2015-11-10 ENCOUNTER — Telehealth: Payer: Self-pay | Admitting: Nurse Practitioner

## 2015-11-11 MED ORDER — TOPIRAMATE 50 MG PO TABS: ORAL_TABLET | ORAL | Status: DC

## 2015-11-11 NOTE — Telephone Encounter (Signed)
topamx rx filled

## 2015-11-18 ENCOUNTER — Other Ambulatory Visit: Payer: Self-pay | Admitting: Nurse Practitioner

## 2015-11-19 ENCOUNTER — Other Ambulatory Visit: Payer: Self-pay | Admitting: Nurse Practitioner

## 2015-11-19 MED ORDER — DICLOFENAC SODIUM 1 % TD GEL: 2.0000 g | Freq: Four times a day (QID) | TRANSDERMAL | Status: DC

## 2015-11-19 NOTE — Telephone Encounter (Signed)
Forwarding to MMM 

## 2015-11-20 ENCOUNTER — Telehealth: Payer: Self-pay

## 2015-11-20 ENCOUNTER — Other Ambulatory Visit: Payer: Self-pay | Admitting: Nurse Practitioner

## 2015-11-20 MED ORDER — CYCLOBENZAPRINE HCL 10 MG PO TABS: ORAL_TABLET | ORAL | Status: DC

## 2015-11-20 NOTE — Telephone Encounter (Signed)
Insurance denied prior authorization for Diclofenac Sodium gel because not supported by the Food and Drug administration for chronic pain

## 2015-11-20 NOTE — Telephone Encounter (Signed)
Needs to stay on flexeril as rx

## 2015-11-21 NOTE — Telephone Encounter (Signed)
Let patient know please

## 2015-11-22 NOTE — Telephone Encounter (Signed)
Detailed message left for patient.

## 2015-12-02 ENCOUNTER — Telehealth: Payer: Self-pay

## 2015-12-02 NOTE — Telephone Encounter (Signed)
Insurance overturned denial of Diclofenac through Aug 29 2016

## 2015-12-31 ENCOUNTER — Telehealth: Payer: Self-pay | Admitting: Nurse Practitioner

## 2016-01-01 NOTE — Telephone Encounter (Signed)
Refused valium because she has not had a clear drug screen- this was discussed wth patient at last appointment

## 2016-01-02 NOTE — Telephone Encounter (Signed)
Patient aware no valium script ordered.     She needs a prior authorization for Ventolin.   She has had pro-air but it did not work.  She has one refill on Ventolin but insurance sent her a letter about needing approval for further refills.

## 2016-01-06 ENCOUNTER — Telehealth: Payer: Self-pay

## 2016-01-06 NOTE — Telephone Encounter (Signed)
Insurance prior authorized Ventolin HFA through 08/29/16 

## 2016-02-10 ENCOUNTER — Other Ambulatory Visit: Payer: Self-pay | Admitting: Nurse Practitioner

## 2016-03-04 ENCOUNTER — Other Ambulatory Visit: Payer: Self-pay | Admitting: *Deleted

## 2016-03-04 MED ORDER — CYCLOBENZAPRINE HCL 10 MG PO TABS: ORAL_TABLET | ORAL | Status: DC

## 2016-03-04 NOTE — Telephone Encounter (Signed)
Last seen 08/2015 -- MMM for pain mgmt.

## 2016-03-04 NOTE — Telephone Encounter (Signed)
Last refill without being seen 

## 2016-03-05 NOTE — Telephone Encounter (Signed)
Pt is aware that she will ntbs prior to any further refills on her Flexeril.

## 2016-03-23 ENCOUNTER — Encounter: Payer: Self-pay | Admitting: Nurse Practitioner

## 2016-03-28 ENCOUNTER — Other Ambulatory Visit: Payer: Self-pay | Admitting: Nurse Practitioner

## 2016-03-29 NOTE — Telephone Encounter (Signed)
topamax #30 with 3 refills 11/11/15-ok to refill

## 2016-04-05 ENCOUNTER — Other Ambulatory Visit: Payer: Self-pay | Admitting: Specialist

## 2016-04-07 ENCOUNTER — Encounter
Admission: RE | Admit: 2016-04-07 | Discharge: 2016-04-07 | Disposition: A | Discharge status: Home / Self Care | Payer: Medicare Other | Source: Ambulatory Visit | Attending: Specialist | Admitting: Specialist

## 2016-04-07 ENCOUNTER — Other Ambulatory Visit: Payer: Self-pay

## 2016-04-07 ENCOUNTER — Ambulatory Visit
Admission: RE | Admit: 2016-04-07 | Discharge: 2016-04-07 | Disposition: A | Discharge status: Home / Self Care | Payer: Medicare Other | Source: Ambulatory Visit | Attending: Anesthesiology | Admitting: Anesthesiology

## 2016-04-07 DIAGNOSIS — Z01810 Encounter for preprocedural cardiovascular examination: Secondary | ICD-10-CM | POA: Diagnosis not present

## 2016-04-07 DIAGNOSIS — Z01812 Encounter for preprocedural laboratory examination: Secondary | ICD-10-CM | POA: Diagnosis present

## 2016-04-07 DIAGNOSIS — J449 Chronic obstructive pulmonary disease, unspecified: Secondary | ICD-10-CM

## 2016-04-07 DIAGNOSIS — Z01818 Encounter for other preprocedural examination: Secondary | ICD-10-CM | POA: Diagnosis not present

## 2016-04-07 HISTORY — DX: Headache: R51

## 2016-04-07 HISTORY — DX: Localized edema: R60.0

## 2016-04-07 HISTORY — DX: Headache, unspecified: R51.9

## 2016-04-07 LAB — TYPE AND SCREEN
ABO/RH(D): A POS
ANTIBODY SCREEN: NEGATIVE

## 2016-04-07 LAB — PROTIME-INR
INR: 0.96
PROTHROMBIN TIME: 12.8 s (ref 11.4–15.2)

## 2016-04-07 LAB — COMPREHENSIVE METABOLIC PANEL
ALK PHOS: 122 U/L (ref 38–126)
ALT: 199 U/L — AB (ref 14–54)
AST: 187 U/L — ABNORMAL HIGH (ref 15–41)
Albumin: 3.6 g/dL (ref 3.5–5.0)
Anion gap: 6 (ref 5–15)
BILIRUBIN TOTAL: 0.4 mg/dL (ref 0.3–1.2)
BUN: 15 mg/dL (ref 6–20)
CALCIUM: 9 mg/dL (ref 8.9–10.3)
CO2: 29 mmol/L (ref 22–32)
CREATININE: 0.75 mg/dL (ref 0.44–1.00)
Chloride: 104 mmol/L (ref 101–111)
Glucose, Bld: 72 mg/dL (ref 65–99)
Potassium: 3.5 mmol/L (ref 3.5–5.1)
Sodium: 139 mmol/L (ref 135–145)
Total Protein: 6.8 g/dL (ref 6.5–8.1)

## 2016-04-07 LAB — URINALYSIS COMPLETE WITH MICROSCOPIC (ARMC ONLY)
Bacteria, UA: NONE SEEN
Bilirubin Urine: NEGATIVE
GLUCOSE, UA: NEGATIVE mg/dL
HGB URINE DIPSTICK: NEGATIVE
KETONES UR: NEGATIVE mg/dL
Leukocytes, UA: NEGATIVE
NITRITE: NEGATIVE
Protein, ur: NEGATIVE mg/dL
SPECIFIC GRAVITY, URINE: 1.017 (ref 1.005–1.030)
pH: 6 (ref 5.0–8.0)

## 2016-04-07 LAB — CBC WITH DIFFERENTIAL/PLATELET
Basophils Absolute: 0.1 10*3/uL (ref 0–0.1)
Basophils Relative: 1 %
EOS PCT: 2 %
Eosinophils Absolute: 0.2 10*3/uL (ref 0–0.7)
HEMATOCRIT: 48.7 % — AB (ref 35.0–47.0)
HEMOGLOBIN: 16.4 g/dL — AB (ref 12.0–16.0)
LYMPHS ABS: 3.3 10*3/uL (ref 1.0–3.6)
LYMPHS PCT: 36 %
MCH: 31 pg (ref 26.0–34.0)
MCHC: 33.6 g/dL (ref 32.0–36.0)
MCV: 92.2 fL (ref 80.0–100.0)
Monocytes Absolute: 0.6 10*3/uL (ref 0.2–0.9)
Monocytes Relative: 6 %
NEUTROS ABS: 5 10*3/uL (ref 1.4–6.5)
NEUTROS PCT: 55 %
Platelets: 164 10*3/uL (ref 150–440)
RBC: 5.28 MIL/uL — AB (ref 3.80–5.20)
RDW: 14.5 % (ref 11.5–14.5)
WBC: 9.2 10*3/uL (ref 3.6–11.0)

## 2016-04-07 LAB — SURGICAL PCR SCREEN
MRSA, PCR: NEGATIVE
Staphylococcus aureus: NEGATIVE

## 2016-04-07 LAB — APTT: aPTT: 31 seconds (ref 24–36)

## 2016-04-07 MED ORDER — CHLORHEXIDINE GLUCONATE CLOTH 2 % EX PADS
6.0000 | MEDICATED_PAD | Freq: Once | CUTANEOUS | Status: DC
  Filled 2016-04-07: qty 6

## 2016-04-07 NOTE — Pre-Procedure Instructions (Signed)
Medical clearance on chart.  Called Dr Randa NgoPiscitello regarding EKG.  It's OK being we have a clearance on the chart.

## 2016-04-07 NOTE — Patient Instructions (Signed)
  Your procedure is scheduled on: 04/14/16 Wed Report to Same Day Surgery 2nd floor medical mall To find out your arrival time please call [phone] between 1PM - 3PM on 04/13/16 Tues  Remember: Instructions that are not followed completely may result in serious medical risk, up to and including death, or upon the discretion of your surgeon and anesthesiologist your surgery may need to be rescheduled.    _x___ 1. Do not eat food or drink liquids after midnight. No gum chewing or hard candies.     __x__ 2. No Alcohol for 24 hours before or after surgery.   __x__3. No Smoking for 24 prior to surgery.   ____  4. Bring all medications with you on the day of surgery if instructed.    __x__ 5. Notify your doctor if there is any change in your medical condition     (cold, fever, infections).     Do not wear jewelry, make-up, hairpins, clips or nail polish.  Do not wear lotions, powders, or perfumes. You may wear deodorant.  Do not shave 48 hours prior to surgery. Men may shave face and neck.  Do not bring valuables to the hospital.    [facility] is not responsible for any belongings or valuables.               Contacts, dentures or bridgework may not be worn into surgery.  Leave your suitcase in the car. After surgery it may be brought to your room.  For patients admitted to the hospital, discharge time is determined by your treatment team.   Patients discharged the day of surgery will not be allowed to drive home.    Please read over the following fact sheets that you were given:   [facility] Preparing for Surgery and or MRSA Information   _x___ Take these medicines the morning of surgery with A SIP OF WATER:    1. atorvastatin (LIPITOR) 20 MG tablet  2.omeprazole (PRILOSEC) 20 MG capsule  3.albuterol (PROVENTIL) (2.5 MG/3ML) 0.083% nebulizer solution bring to hospital  4.  5.  6.  ____ Fleet Enema (as directed)   _x___ Use CHG Soap or sage wipes as directed on instruction  sheet   _x___ Use inhalers on the day of surgery and bring to hospital day of surgery  ____ Stop metformin 2 days prior to surgery    ____ Take 1/2 of usual insulin dose the night before surgery and none on the morning of           surgery.   ____ Stop aspirin or coumadin, or plavix  _x__ Stop Anti-inflammatories such as Advil, Aleve, Ibuprofen, Motrin, Naproxen,          Naprosyn, Goodies powders or aspirin products. Ok to take Tylenol.   ____ Stop supplements until after surgery.    ____ Bring C-Pap to the hospital.

## 2016-04-14 ENCOUNTER — Inpatient Hospital Stay: Payer: Medicare Other | Admitting: Anesthesiology

## 2016-04-14 ENCOUNTER — Encounter
Admission: RE | Disposition: A | Discharge status: Home Health | Payer: Self-pay | Source: Ambulatory Visit | Attending: Specialist

## 2016-04-14 ENCOUNTER — Inpatient Hospital Stay: Payer: Medicare Other

## 2016-04-14 ENCOUNTER — Encounter: Payer: Self-pay | Admitting: *Deleted

## 2016-04-14 ENCOUNTER — Inpatient Hospital Stay
Admission: RE | Admit: 2016-04-14 | Discharge: 2016-04-18 | DRG: 470 | Disposition: A | Discharge status: Home Health | Payer: Medicare Other | Source: Ambulatory Visit | Attending: Specialist | Admitting: Specialist

## 2016-04-14 DIAGNOSIS — Z882 Allergy status to sulfonamides status: Secondary | ICD-10-CM | POA: Diagnosis not present

## 2016-04-14 DIAGNOSIS — M1712 Unilateral primary osteoarthritis, left knee: Secondary | ICD-10-CM | POA: Diagnosis present

## 2016-04-14 DIAGNOSIS — K219 Gastro-esophageal reflux disease without esophagitis: Secondary | ICD-10-CM | POA: Diagnosis present

## 2016-04-14 DIAGNOSIS — Z471 Aftercare following joint replacement surgery: Secondary | ICD-10-CM | POA: Diagnosis not present

## 2016-04-14 DIAGNOSIS — M797 Fibromyalgia: Secondary | ICD-10-CM | POA: Diagnosis present

## 2016-04-14 DIAGNOSIS — Z880 Allergy status to penicillin: Secondary | ICD-10-CM

## 2016-04-14 DIAGNOSIS — Z9981 Dependence on supplemental oxygen: Secondary | ICD-10-CM | POA: Diagnosis not present

## 2016-04-14 DIAGNOSIS — Z888 Allergy status to other drugs, medicaments and biological substances status: Secondary | ICD-10-CM | POA: Diagnosis not present

## 2016-04-14 DIAGNOSIS — L299 Pruritus, unspecified: Secondary | ICD-10-CM | POA: Diagnosis present

## 2016-04-14 DIAGNOSIS — Z79899 Other long term (current) drug therapy: Secondary | ICD-10-CM

## 2016-04-14 DIAGNOSIS — Z96651 Presence of right artificial knee joint: Secondary | ICD-10-CM | POA: Diagnosis not present

## 2016-04-14 DIAGNOSIS — F17210 Nicotine dependence, cigarettes, uncomplicated: Secondary | ICD-10-CM | POA: Diagnosis present

## 2016-04-14 DIAGNOSIS — J449 Chronic obstructive pulmonary disease, unspecified: Secondary | ICD-10-CM | POA: Diagnosis present

## 2016-04-14 DIAGNOSIS — E785 Hyperlipidemia, unspecified: Secondary | ICD-10-CM | POA: Diagnosis present

## 2016-04-14 DIAGNOSIS — Z96659 Presence of unspecified artificial knee joint: Secondary | ICD-10-CM

## 2016-04-14 HISTORY — PX: TOTAL KNEE ARTHROPLASTY: SHX125

## 2016-04-14 LAB — CBC
HEMATOCRIT: 41.9 % (ref 35.0–47.0)
HEMOGLOBIN: 14.2 g/dL (ref 12.0–16.0)
MCH: 31.3 pg (ref 26.0–34.0)
MCHC: 34 g/dL (ref 32.0–36.0)
MCV: 92.3 fL (ref 80.0–100.0)
Platelets: 129 10*3/uL — ABNORMAL LOW (ref 150–440)
RBC: 4.54 MIL/uL (ref 3.80–5.20)
RDW: 14.3 % (ref 11.5–14.5)
WBC: 6.2 10*3/uL (ref 3.6–11.0)

## 2016-04-14 LAB — CREATININE, SERUM
Creatinine, Ser: 0.7 mg/dL (ref 0.44–1.00)
GFR calc Af Amer: >60 mL/min (ref >60)

## 2016-04-14 SURGERY — ARTHROPLASTY, KNEE, TOTAL
Anesthesia: Spinal | Laterality: Left

## 2016-04-14 MED ORDER — SCOPOLAMINE 1 MG/3DAYS TD PT72
1.0000 | MEDICATED_PATCH | Freq: Once | TRANSDERMAL | Status: DC
  Filled 2016-04-14 (×2): qty 1

## 2016-04-14 MED ORDER — SODIUM CHLORIDE 0.9 % IV SOLN
INTRAVENOUS | Status: DC | PRN
  Administered 2016-04-14: 60 mL

## 2016-04-14 MED ORDER — MORPHINE SULFATE (PF) 4 MG/ML IV SOLN
INTRAVENOUS | Status: AC
  Filled 2016-04-14: qty 1

## 2016-04-14 MED ORDER — SODIUM CHLORIDE 0.45 % IV SOLN
INTRAVENOUS | Status: DC
  Administered 2016-04-14: 1000 mL via INTRAVENOUS
  Administered 2016-04-14: 75 mL/h via INTRAVENOUS

## 2016-04-14 MED ORDER — ACETAMINOPHEN 650 MG RE SUPP
650.0000 mg | Freq: Four times a day (QID) | RECTAL | Status: DC | PRN
Start: 2016-04-14 — End: 2016-04-18

## 2016-04-14 MED ORDER — ACETAMINOPHEN 325 MG PO TABS: 650.0000 mg | ORAL_TABLET | Freq: Four times a day (QID) | ORAL | Status: DC | PRN

## 2016-04-14 MED ORDER — NALBUPHINE HCL 10 MG/ML IJ SOLN
5.0000 mg | Freq: Once | INTRAMUSCULAR | Status: DC | PRN
  Filled 2016-04-14: qty 0.5

## 2016-04-14 MED ORDER — DIPHENHYDRAMINE HCL 25 MG PO CAPS
25.0000 mg | ORAL_CAPSULE | ORAL | Status: DC | PRN
  Administered 2016-04-16 – 2016-04-18 (×8): 25 mg via ORAL
  Filled 2016-04-14 (×9): qty 1

## 2016-04-14 MED ORDER — ONDANSETRON HCL 4 MG/2ML IJ SOLN: 4.0000 mg | Freq: Four times a day (QID) | INTRAMUSCULAR | Status: DC | PRN

## 2016-04-14 MED ORDER — ACETAMINOPHEN 10 MG/ML IV SOLN
INTRAVENOUS | Status: DC | PRN
  Administered 2016-04-14: 1000 mg via INTRAVENOUS

## 2016-04-14 MED ORDER — SODIUM CHLORIDE 0.9 % IJ SOLN
INTRAMUSCULAR | Status: AC
  Filled 2016-04-14: qty 50

## 2016-04-14 MED ORDER — ATORVASTATIN CALCIUM 20 MG PO TABS
40.0000 mg | ORAL_TABLET | ORAL | Status: AC
  Administered 2016-04-14: 40 mg via ORAL
  Filled 2016-04-14: qty 2

## 2016-04-14 MED ORDER — FUROSEMIDE 40 MG PO TABS: 40.0000 mg | ORAL_TABLET | Freq: Every day | ORAL | Status: DC | PRN

## 2016-04-14 MED ORDER — NEOMYCIN-POLYMYXIN B GU 40-200000 IR SOLN
Status: DC | PRN
  Administered 2016-04-14: 12 mL

## 2016-04-14 MED ORDER — MORPHINE SULFATE (PF) 0.5 MG/ML IJ SOLN
INTRAMUSCULAR | Status: DC | PRN
  Administered 2016-04-14: .1 mg via EPIDURAL

## 2016-04-14 MED ORDER — NICOTINE 14 MG/24HR TD PT24
14.0000 mg | MEDICATED_PATCH | Freq: Every day | TRANSDERMAL | Status: DC
  Administered 2016-04-14 – 2016-04-18 (×5): 14 mg via TRANSDERMAL
  Filled 2016-04-14 (×5): qty 1

## 2016-04-14 MED ORDER — ALBUTEROL SULFATE (2.5 MG/3ML) 0.083% IN NEBU: 2.5000 mg | INHALATION_SOLUTION | Freq: Four times a day (QID) | RESPIRATORY_TRACT | Status: DC | PRN

## 2016-04-14 MED ORDER — METOCLOPRAMIDE HCL 5 MG/ML IJ SOLN: 5.0000 mg | Freq: Three times a day (TID) | INTRAMUSCULAR | Status: DC | PRN

## 2016-04-14 MED ORDER — TRANEXAMIC ACID 1000 MG/10ML IV SOLN
1000.0000 mg | Freq: Once | INTRAVENOUS | Status: AC
  Administered 2016-04-14: 1000 mg via INTRAVENOUS
  Filled 2016-04-14: qty 10

## 2016-04-14 MED ORDER — PHENOL 1.4 % MT LIQD: 1.0000 | OROMUCOSAL | Status: DC | PRN

## 2016-04-14 MED ORDER — BUPIVACAINE HCL (PF) 0.25 % IJ SOLN
INTRAMUSCULAR | Status: AC
  Filled 2016-04-14: qty 30

## 2016-04-14 MED ORDER — FENTANYL CITRATE (PF) 100 MCG/2ML IJ SOLN
INTRAMUSCULAR | Status: AC
  Filled 2016-04-14: qty 2

## 2016-04-14 MED ORDER — HYDROCODONE-ACETAMINOPHEN 7.5-325 MG PO TABS
1.0000 | ORAL_TABLET | ORAL | Status: DC | PRN
  Administered 2016-04-14 – 2016-04-16 (×9): 2 via ORAL
  Filled 2016-04-14 (×11): qty 2

## 2016-04-14 MED ORDER — MORPHINE SULFATE (PF) 2 MG/ML IV SOLN
2.0000 mg | INTRAVENOUS | Status: DC | PRN
  Administered 2016-04-14 – 2016-04-18 (×18): 2 mg via INTRAVENOUS
  Filled 2016-04-14 (×18): qty 1

## 2016-04-14 MED ORDER — ENOXAPARIN SODIUM 40 MG/0.4ML ~~LOC~~ SOLN
40.0000 mg | SUBCUTANEOUS | Status: DC
  Administered 2016-04-15 – 2016-04-18 (×4): 40 mg via SUBCUTANEOUS
  Filled 2016-04-14 (×4): qty 0.4

## 2016-04-14 MED ORDER — VANCOMYCIN HCL 10 G IV SOLR
1250.0000 mg | Freq: Two times a day (BID) | INTRAVENOUS | Status: AC
  Administered 2016-04-14 (×2): 1250 mg via INTRAVENOUS
  Filled 2016-04-14 (×2): qty 1250

## 2016-04-14 MED ORDER — MENTHOL 3 MG MT LOZG
1.0000 | LOZENGE | OROMUCOSAL | Status: DC | PRN
Start: 2016-04-14 — End: 2016-04-18

## 2016-04-14 MED ORDER — METHOCARBAMOL 500 MG PO TABS
500.0000 mg | ORAL_TABLET | Freq: Four times a day (QID) | ORAL | Status: DC | PRN
  Administered 2016-04-14 – 2016-04-18 (×10): 500 mg via ORAL
  Filled 2016-04-14 (×11): qty 1

## 2016-04-14 MED ORDER — SODIUM CHLORIDE 0.9 % IV SOLN
INTRAVENOUS | Status: DC | PRN
  Administered 2016-04-14: 30 ug/min via INTRAVENOUS

## 2016-04-14 MED ORDER — MEPERIDINE HCL 25 MG/ML IJ SOLN: 6.2500 mg | INTRAMUSCULAR | Status: DC | PRN

## 2016-04-14 MED ORDER — TRANEXAMIC ACID 1000 MG/10ML IV SOLN
1000.0000 mg | INTRAVENOUS | Status: AC
  Administered 2016-04-14: 1000 mg via INTRAVENOUS
  Filled 2016-04-14: qty 10

## 2016-04-14 MED ORDER — BUPIVACAINE HCL (PF) 0.25 % IJ SOLN
INTRAMUSCULAR | Status: DC | PRN
  Administered 2016-04-14: 30 mL

## 2016-04-14 MED ORDER — DICLOFENAC SODIUM 1 % TD GEL: 2.0000 g | Freq: Four times a day (QID) | TRANSDERMAL | Status: DC | PRN

## 2016-04-14 MED ORDER — PROPOFOL 10 MG/ML IV BOLUS
INTRAVENOUS | Status: DC | PRN
  Administered 2016-04-14: 30 mg via INTRAVENOUS

## 2016-04-14 MED ORDER — PHENYLEPHRINE HCL 10 MG/ML IJ SOLN
INTRAMUSCULAR | Status: DC | PRN
  Administered 2016-04-14 (×6): 100 ug via INTRAVENOUS

## 2016-04-14 MED ORDER — ALBUTEROL SULFATE HFA 108 (90 BASE) MCG/ACT IN AERS: 2.0000 | INHALATION_SPRAY | Freq: Four times a day (QID) | RESPIRATORY_TRACT | Status: DC | PRN

## 2016-04-14 MED ORDER — METHOCARBAMOL 1000 MG/10ML IJ SOLN
500.0000 mg | Freq: Four times a day (QID) | INTRAVENOUS | Status: DC | PRN
  Filled 2016-04-14: qty 5

## 2016-04-14 MED ORDER — LACTATED RINGERS IV SOLN
INTRAVENOUS | Status: DC
  Administered 2016-04-14 (×2): via INTRAVENOUS

## 2016-04-14 MED ORDER — DIPHENHYDRAMINE HCL 12.5 MG/5ML PO ELIX: 12.5000 mg | ORAL_SOLUTION | ORAL | Status: DC | PRN

## 2016-04-14 MED ORDER — SENNA 8.6 MG PO TABS
1.0000 | ORAL_TABLET | Freq: Two times a day (BID) | ORAL | Status: DC
  Administered 2016-04-14 – 2016-04-18 (×8): 8.6 mg via ORAL
  Filled 2016-04-14 (×9): qty 1

## 2016-04-14 MED ORDER — ACETAMINOPHEN 500 MG PO TABS
1000.0000 mg | ORAL_TABLET | Freq: Four times a day (QID) | ORAL | Status: AC
  Administered 2016-04-14 – 2016-04-15 (×2): 1000 mg via ORAL
  Filled 2016-04-14 (×2): qty 2

## 2016-04-14 MED ORDER — FENTANYL CITRATE (PF) 100 MCG/2ML IJ SOLN
25.0000 ug | INTRAMUSCULAR | Status: DC | PRN
  Administered 2016-04-14 (×2): 25 ug via INTRAVENOUS

## 2016-04-14 MED ORDER — BUPIVACAINE HCL (PF) 0.5 % IJ SOLN
INTRAMUSCULAR | Status: DC | PRN
  Administered 2016-04-14: 3 mL

## 2016-04-14 MED ORDER — ONDANSETRON HCL 4 MG PO TABS: 4.0000 mg | ORAL_TABLET | Freq: Four times a day (QID) | ORAL | Status: DC | PRN

## 2016-04-14 MED ORDER — NALBUPHINE HCL 10 MG/ML IJ SOLN
5.0000 mg | INTRAMUSCULAR | Status: DC | PRN
  Filled 2016-04-14: qty 0.5

## 2016-04-14 MED ORDER — SODIUM CHLORIDE FLUSH 0.9 % IV SOLN
INTRAVENOUS | Status: AC
  Filled 2016-04-14: qty 10

## 2016-04-14 MED ORDER — IBUPROFEN 600 MG PO TABS
600.0000 mg | ORAL_TABLET | Freq: Four times a day (QID) | ORAL | Status: DC | PRN
  Administered 2016-04-15: 600 mg via ORAL
  Filled 2016-04-14: qty 1

## 2016-04-14 MED ORDER — ONDANSETRON HCL 4 MG/2ML IJ SOLN: 4.0000 mg | Freq: Once | INTRAMUSCULAR | Status: DC | PRN

## 2016-04-14 MED ORDER — NALOXONE HCL 2 MG/2ML IJ SOSY
1.0000 ug/kg/h | PREFILLED_SYRINGE | INTRAVENOUS | Status: DC | PRN
  Filled 2016-04-14: qty 2

## 2016-04-14 MED ORDER — FLEET ENEMA 7-19 GM/118ML RE ENEM
1.0000 | ENEMA | Freq: Once | RECTAL | Status: DC | PRN
Start: 2016-04-14 — End: 2016-04-18

## 2016-04-14 MED ORDER — SODIUM CHLORIDE 0.9% FLUSH: 3.0000 mL | INTRAVENOUS | Status: DC | PRN

## 2016-04-14 MED ORDER — ONDANSETRON HCL 4 MG/2ML IJ SOLN
4.0000 mg | Freq: Three times a day (TID) | INTRAMUSCULAR | Status: DC | PRN
  Administered 2016-04-15 – 2016-04-16 (×2): 4 mg via INTRAVENOUS
  Filled 2016-04-14 (×2): qty 2

## 2016-04-14 MED ORDER — PANTOPRAZOLE SODIUM 40 MG PO TBEC
40.0000 mg | DELAYED_RELEASE_TABLET | Freq: Every day | ORAL | Status: DC
  Administered 2016-04-14 – 2016-04-18 (×5): 40 mg via ORAL
  Filled 2016-04-14 (×6): qty 1

## 2016-04-14 MED ORDER — BISACODYL 10 MG RE SUPP: 10.0000 mg | Freq: Every day | RECTAL | Status: DC | PRN

## 2016-04-14 MED ORDER — ACETAMINOPHEN 10 MG/ML IV SOLN
INTRAVENOUS | Status: AC
  Filled 2016-04-14: qty 100

## 2016-04-14 MED ORDER — NEOMYCIN-POLYMYXIN B GU 40-200000 IR SOLN
Status: AC
  Filled 2016-04-14: qty 20

## 2016-04-14 MED ORDER — DIPHENHYDRAMINE HCL 50 MG/ML IJ SOLN: 12.5000 mg | INTRAMUSCULAR | Status: DC | PRN

## 2016-04-14 MED ORDER — FERROUS SULFATE 325 (65 FE) MG PO TABS
325.0000 mg | ORAL_TABLET | Freq: Three times a day (TID) | ORAL | Status: DC
  Administered 2016-04-14 – 2016-04-18 (×13): 325 mg via ORAL
  Filled 2016-04-14 (×13): qty 1

## 2016-04-14 MED ORDER — ALUM & MAG HYDROXIDE-SIMETH 200-200-20 MG/5ML PO SUSP: 30.0000 mL | ORAL | Status: DC | PRN

## 2016-04-14 MED ORDER — VANCOMYCIN HCL IN DEXTROSE 1-5 GM/200ML-% IV SOLN
1000.0000 mg | INTRAVENOUS | Status: AC
  Administered 2016-04-14: 1000 mg via INTRAVENOUS

## 2016-04-14 MED ORDER — MORPHINE SULFATE (PF) 2 MG/ML IV SOLN
INTRAVENOUS | Status: AC
  Filled 2016-04-14: qty 1

## 2016-04-14 MED ORDER — TOPIRAMATE 25 MG PO TABS
50.0000 mg | ORAL_TABLET | Freq: Every day | ORAL | Status: DC
  Administered 2016-04-15 – 2016-04-18 (×4): 50 mg via ORAL
  Filled 2016-04-14 (×4): qty 2

## 2016-04-14 MED ORDER — PROPOFOL 500 MG/50ML IV EMUL
INTRAVENOUS | Status: DC | PRN
  Administered 2016-04-14: 75 ug/kg/min via INTRAVENOUS

## 2016-04-14 MED ORDER — MIDAZOLAM HCL 5 MG/5ML IJ SOLN
INTRAMUSCULAR | Status: DC | PRN
  Administered 2016-04-14: 2 mg via INTRAVENOUS

## 2016-04-14 MED ORDER — MAGNESIUM HYDROXIDE 400 MG/5ML PO SUSP
30.0000 mL | Freq: Every day | ORAL | Status: DC | PRN
  Administered 2016-04-15 – 2016-04-16 (×2): 30 mL via ORAL
  Filled 2016-04-14 (×2): qty 30

## 2016-04-14 MED ORDER — MORPHINE SULFATE (PF) 4 MG/ML IV SOLN
INTRAVENOUS | Status: DC | PRN
  Administered 2016-04-14: 4 mg via INTRAVENOUS

## 2016-04-14 MED ORDER — FLUTICASONE PROPIONATE 50 MCG/ACT NA SUSP
2.0000 | Freq: Every day | NASAL | Status: DC
  Administered 2016-04-15: 2 via NASAL
  Filled 2016-04-14: qty 16

## 2016-04-14 MED ORDER — VANCOMYCIN HCL IN DEXTROSE 1-5 GM/200ML-% IV SOLN
INTRAVENOUS | Status: AC
  Filled 2016-04-14: qty 200

## 2016-04-14 MED ORDER — BUPIVACAINE LIPOSOME 1.3 % IJ SUSP
INTRAMUSCULAR | Status: AC
  Filled 2016-04-14: qty 20

## 2016-04-14 MED ORDER — METOCLOPRAMIDE HCL 10 MG PO TABS
5.0000 mg | ORAL_TABLET | Freq: Three times a day (TID) | ORAL | Status: DC | PRN
Start: 2016-04-14 — End: 2016-04-18

## 2016-04-14 MED ORDER — NALOXONE HCL 0.4 MG/ML IJ SOLN: 0.4000 mg | INTRAMUSCULAR | Status: DC | PRN

## 2016-04-14 SURGICAL SUPPLY — 58 items
AUTOTRANSFUS HAS 1/8 (MISCELLANEOUS) ×3
BLADE DEBAKEY 8.0 (BLADE) ×2 IMPLANT
BLADE DEBAKEY 8.0MM (BLADE) ×1
BLADE SAGITTAL WIDE XTHICK NO (BLADE) ×3 IMPLANT
BONE CEMENT GENTAMICIN (Cement) ×6 IMPLANT
CANISTER SUCT 1200ML W/VALVE (MISCELLANEOUS) ×3 IMPLANT
CANISTER SUCT 3000ML (MISCELLANEOUS) ×3 IMPLANT
CAP KNEE TOTAL 3 SIGMA ×3 IMPLANT
CATH TRAY METER 16FR LF (MISCELLANEOUS) ×3 IMPLANT
CEMENT BONE GENTAMICIN 40 (Cement) ×2 IMPLANT
CHLORAPREP W/TINT 26ML (MISCELLANEOUS) ×6 IMPLANT
COOLER POLAR GLACIER W/PUMP (MISCELLANEOUS) ×3 IMPLANT
CUFF TOURN 24 STER (MISCELLANEOUS) ×3 IMPLANT
CUFF TOURN 30 STER DUAL PORT (MISCELLANEOUS) IMPLANT
CUP MEDICINE 2OZ PLAST GRAD ST (MISCELLANEOUS) IMPLANT
DRAPE INCISE IOBAN 66X60 STRL (DRAPES) ×3 IMPLANT
DRAPE SHEET LG 3/4 BI-LAMINATE (DRAPES) ×3 IMPLANT
DRSG AQUACEL AG ADV 3.5X10 (GAUZE/BANDAGES/DRESSINGS) ×3 IMPLANT
DRSG AQUACEL AG ADV 3.5X14 (GAUZE/BANDAGES/DRESSINGS) ×3 IMPLANT
ELECT REM PT RETURN 9FT ADLT (ELECTROSURGICAL) ×3
ELECTRODE REM PT RTRN 9FT ADLT (ELECTROSURGICAL) ×1 IMPLANT
GLOVE BIO SURGEON STRL SZ8 (GLOVE) ×3 IMPLANT
GLOVE BIOGEL PI IND STRL 8.5 (GLOVE) ×1 IMPLANT
GLOVE BIOGEL PI INDICATOR 8.5 (GLOVE) ×2
GLOVE INDICATOR 8.0 STRL GRN (GLOVE) ×6 IMPLANT
GLOVE SURG ORTHO 8.0 STRL STRW (GLOVE) ×3 IMPLANT
GLOVE SURG ORTHO 8.5 STRL (GLOVE) ×6 IMPLANT
GOWN STRL REUS W/ TWL LRG LVL3 (GOWN DISPOSABLE) ×3 IMPLANT
GOWN STRL REUS W/TWL LRG LVL3 (GOWN DISPOSABLE) ×6
HANDPIECE INTERPULSE COAX TIP (DISPOSABLE) ×2
IMMBOLIZER KNEE 19 BLUE UNIV (SOFTGOODS) ×3 IMPLANT
KIT RM TURNOVER STRD PROC AR (KITS) ×3 IMPLANT
NEEDLE 18GX1X1/2 (RX/OR ONLY) (NEEDLE) ×3 IMPLANT
NEEDLE SPNL 20GX3.5 QUINCKE YW (NEEDLE) ×3 IMPLANT
NS IRRIG 1000ML POUR BTL (IV SOLUTION) ×3 IMPLANT
PACK TOTAL KNEE (MISCELLANEOUS) ×3 IMPLANT
PAD WRAPON POLAR KNEE (MISCELLANEOUS) ×1 IMPLANT
SET HNDPC FAN SPRY TIP SCT (DISPOSABLE) ×1 IMPLANT
SOL .9 NS 3000ML IRR  AL (IV SOLUTION) ×2
SOL .9 NS 3000ML IRR UROMATIC (IV SOLUTION) ×1 IMPLANT
SPONGE LAP 18X18 5 PK (GAUZE/BANDAGES/DRESSINGS) ×3 IMPLANT
STAPLER SKIN PROX 35W (STAPLE) ×3 IMPLANT
SUCTION FRAZIER HANDLE 10FR (MISCELLANEOUS) ×2
SUCTION TUBE FRAZIER 10FR DISP (MISCELLANEOUS) ×1 IMPLANT
SUT BONE WAX W31G (SUTURE) ×3 IMPLANT
SUT DVC 2 QUILL PDO  T11 36X36 (SUTURE) ×2
SUT DVC 2 QUILL PDO T11 36X36 (SUTURE) ×1 IMPLANT
SUT ORTHOCORD OS-6 NDL 36 (SUTURE) IMPLANT
SUT QUILL PDO 0 36 36 VIOLET (SUTURE) ×3 IMPLANT
SUT VIC AB 0 CT1 36 (SUTURE) IMPLANT
SUT VIC AB 2-0 CT1 (SUTURE) IMPLANT
SYR 20CC LL (SYRINGE) ×6 IMPLANT
SYR 50ML LL SCALE MARK (SYRINGE) ×6 IMPLANT
SYSTEM AUTOTRANSFUS DUAL TROCR (MISCELLANEOUS) ×1 IMPLANT
TAPE MICROFOAM 4IN (TAPE) ×3 IMPLANT
TOWER CARTRIDGE SMART MIX (DISPOSABLE) ×3 IMPLANT
TUBE SUCT KAM VAC (TUBING) ×3 IMPLANT
WRAPON POLAR PAD KNEE (MISCELLANEOUS) ×3

## 2016-04-14 NOTE — Transfer of Care (Signed)
Immediate Anesthesia Transfer of Care Note  Patient: Bethany Villa  Procedure(s) Performed: Procedure(s): TOTAL KNEE ARTHROPLASTY (Left)  Patient Location: PACU  Anesthesia Type:Spinal  Level of Consciousness: awake  Airway & Oxygen Therapy: Patient connected to face mask oxygen  Post-op Assessment: Post -op Vital signs reviewed and stable  Post vital signs: stable  Last Vitals:  Vitals:   04/14/16 0619 04/14/16 1033  BP: 134/86 113/83  Pulse: 96 77  Resp: 14 15  Temp: 36.3 C 36.3 C    Last Pain:  Vitals:   04/14/16 1033  TempSrc: Temporal  PainSc:          Complications: No apparent anesthesia complications

## 2016-04-14 NOTE — Op Note (Signed)
DATE OF SURGERY:  04/14/2016 TIME: 10:27 AM  PATIENT NAME:  Bethany Villa   AGE: 60 y.o.    PRE-OPERATIVE DIAGNOSIS:  M17.12 Unilateral primary osteoarthritis, left knee  POST-OPERATIVE DIAGNOSIS:  Same  PROCEDURE:  Procedure(s): TOTAL KNEE ARTHROPLASTY DEPUY LCS ROTATING PLATFORM   SURGEON:  Voland,Fard Borunda E, MD   ASSISTANT: MAURICE JONES, PAC  OPERATIVE IMPLANTS: Depuy LCS Femur/Patella size standard plus, Tibia size #3,  Rotating platform polyethylene size 10 mm    Total tourniquet time was 122 minutes.  PREOPERATIVE INDICATIONS:  Bethany Cairoenny E Carducci is a 60 y.o. year old female with end stage bone on bone degenerative arthritis of the knee who failed conservative treatment, including injections, antiinflammatories, activity modification, and assistive devices, and had significant impairment of their activities of daily living, and elected for Total Knee Arthroplasty.   The risks, benefits, and alternatives were discussed at length including but not limited to the risks of infection, bleeding, nerve injury, stiffness, blood clots, the need for revision surgery, cardiopulmonary complications, among others, and they were willing to proceed.  OPERATIVE FINDINGS AND UNIQUE ASPECTS OF THE CASE:  Severe arthritis and valgus deformity  OPERATIVE DESCRIPTION:   The patient was brought to the operative room and placed in a supine position. Spinal anesthesia was administered. IV Vancomycin antibiotics were given. The lower extremity was prepped and draped in the usual sterile fashion. Time out was performed. The leg was elevated and exsanguinated and the tourniquet was inflated to 350 mmHg  An anterior midline incision was made.  Anterior quadriceps tendon splitting approach was performed. The patella was everted and osteophytes were removed. The anterior horn of the medial and lateral meniscus was removed.  Then the extramedullary tibial cutting jig was utilized making the appropriate cut  using the anterior tibial crest as a reference building in appropriate posterior slope. Care was taken during the cut to protect the medial and collateral ligaments. The proximal tibia was removed along with the posterior horns of the menisci. The PCL was sacrificed.  The distal femur was sized as a standard plus. Medial release was carried out. The anterior femoral cutting guide was aligned and centering hole made. The rotation guide was inserted and the anterior cutting guide pinned in place, and was in excellent alignment. The posterior femoral cuts were made. A 10 mm flexion gap was established. The distal femoral cutting guide was introduced at 4 of valgus. This was pinned and the distal femoral cut made. A 10 mm extension gap was established and was stable. The finishing guide was applied and finishing cuts made. The Mchale retractor was inserted and the keeled #3 tibial trial was pinned in place. Centering hole was made and the keel inserted. The femoral component was inserted along with a 10 mm  insert and the knee articulated.  Extension and flexion showed good stability throughout. The patella was then sized and cut made for the patellar component. Centering holes were made. The trial was inserted and the knee articulated nicely with no need for lateral release. The trials were all removed and the knee thoroughly irrigated with pulsed lavage. Exparil was injected. The knee was dried and the cement with Gentamycin mixed. The #3 keeled tibial component, standard plus femoral component and patellar components were all cemented in place with the polyethylene spacer inserted and excess cement was removed. The cement was allowed to harden for 10 minutes. Further irrigation was carried out. Bone wax was applied to all raw bony surfaces. Autovac drains were  inserted. Quarter percent plain Marcaine and morphine were injected. The capsule was closed with #2 Quill suture, and the subcutaneous tissues were closed  with 0 Quill suture. The skin was closed with staples.Sponge and needle counts were correct.  Aquacel dressing with TENS pads and a dry sterile dressing were applied. Polar Care and knee immobilizer were applied. Tourniquet was deflated with excellent return of blood flow to foot. Patient was transferred to a hospital bed and taken to the recovery room in good condition.  Valinda HoarHoward E Takach, MD

## 2016-04-14 NOTE — [place] FL2 (Signed)
[place] MEDICAID FL2 LEVEL OF CARE SCREENING TOOL     IDENTIFICATION  Patient Name: Bethany Villa Birthdate: 11/15/1955 Sex: female Admission Date (Current Location): 04/14/2016  [place]   Facility and Address:  [facility], [address], [place], [place] [address]      Provider Number: [id number]  Attending Physician Name and Address:  Deeann SaintHoward Canevari, MD  Relative Name and Phone Number:       Current Level of Care: Hospital Recommended Level of Care: Skilled Nursing Facility Prior Approval Number:    Date Approved/Denied:   PASRR Number:  [id number])  Discharge Plan: SNF    Current Diagnoses: Patient Active Problem List   Diagnosis Date Noted  . 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 Acute respiratory failure (HCC)   . Chronic obstructive pulmonary disease with acute lower respiratory infection (HCC)   . Acute respiratory failure with hypoxia (HCC)   . Sepsis (HCC) 09/03/2015  . Respiratory failure, acute (HCC) 09/03/2015  . CAP (community acquired pneumonia) 09/01/2015  . SIRS (systemic inflammatory response syndrome) (HCC) 07/24/2014  . COPD with acute exacerbation (HCC) 09/29/2013  . Insomnia 11/04/2012  . GAD (generalized anxiety disorder) 11/04/2012  . Depression 11/04/2012  . Fibromyalgia 11/04/2012  . Metabolic encephalopathy 10/03/2011  . COPD (chronic obstructive pulmonary disease) (HCC) 06/01/2011  . Smoking 05/19/2011  . Chronic pain disorder 05/19/2011  . Obesity 05/19/2011  . Hyperglycemia 05/19/2011    Orientation RESPIRATION BLADDER Height & Weight     Self, Time, Situation, Place  Normal Continent Weight: 187 lb (84.8 kg) Height:  5\' 3"  (160 cm)  BEHAVIORAL SYMPTOMS/MOOD NEUROLOGICAL BOWEL NUTRITION STATUS   (none )  (none ) Continent Diet (Diet: Regular )  AMBULATORY STATUS COMMUNICATION OF NEEDS Skin   Extensive Assist Verbally Surgical wounds (Incision: Left Knee )                       Personal Care Assistance Level of Assistance  Bathing, Feeding, Dressing Bathing Assistance: Limited assistance Feeding assistance: Independent Dressing Assistance: Limited assistance     Functional Limitations Info  Sight, Hearing, Speech Sight Info: Adequate Hearing Info: Adequate Speech Info: Adequate    SPECIAL CARE FACTORS FREQUENCY  PT (By licensed PT), OT (By licensed OT)     PT Frequency:  (5) OT Frequency:  (5)            Contractures      Additional Factors Info  Code Status, Allergies, Isolation Precautions Code Status Info:  (Full Code. ) Allergies Info:  (Gabapentin, Mobic Meloxicam, Penicillins, Sulfa Antibiotics)     Isolation Precautions Info:  (MRSA Nasal Swab. )     Current Medications (04/14/2016):  This is the current hospital active medication list Current Facility-Administered Medications  Medication Dose Route Frequency Provider Last Rate Last Dose  . 0.45 % sodium chloride infusion   Intravenous Continuous Deeann SaintHoward Noffke, MD 75 mL/hr at 04/14/16 1125 1,000 mL at 04/14/16 1125  . acetaminophen (TYLENOL) tablet 650 mg  650 mg Oral Q6H PRN Deeann SaintHoward Rosello, MD       Or  . acetaminophen (TYLENOL) suppository 650 mg  650 mg Rectal Q6H PRN Deeann SaintHoward Doffing, MD      . acetaminophen (TYLENOL) tablet 1,000 mg  1,000 mg Oral Q6H Deeann SaintHoward Whinery, MD      . albuterol (PROVENTIL) (2.5 MG/3ML) 0.083% nebulizer solution 2.5 mg  2.5 mg Nebulization Q6H PRN Deeann SaintHoward Sibert, MD      .  alum & mag hydroxide-simeth (MAALOX/MYLANTA) 200-200-20 MG/5ML suspension 30 mL  30 mL Oral Q4H PRN Deeann Saint, MD      . atorvastatin (LIPITOR) tablet 40 mg  40 mg Oral 1 day or 1 dose Deeann Saint, MD      . bisacodyl (DULCOLAX) suppository 10 mg  10 mg Rectal Daily PRN Deeann Saint, MD      . diclofenac sodium (VOLTAREN) 1 % transdermal gel 2 g  2 g Topical QID PRN Deeann Saint, MD      . diphenhydrAMINE (BENADRYL) 12.5 MG/5ML elixir 12.5-25 mg  12.5-25 mg Oral  Q4H PRN Deeann Saint, MD      . diphenhydrAMINE (BENADRYL) injection 12.5 mg  12.5 mg Intravenous Q4H PRN Yevette Edwards, MD       Or  . diphenhydrAMINE (BENADRYL) capsule 25 mg  25 mg Oral Q4H PRN Yevette Edwards, MD      . Melene Muller ON 04/15/2016] enoxaparin (LOVENOX) injection 40 mg  40 mg Subcutaneous Q24H Deeann Saint, MD      . fentaNYL (SUBLIMAZE) 100 MCG/2ML injection           . ferrous sulfate tablet 325 mg  325 mg Oral TID PC Deeann Saint, MD   325 mg at 04/14/16 1334  . fluticasone (FLONASE) 50 MCG/ACT nasal spray 2 spray  2 spray Each Nare Daily Deeann Saint, MD      . furosemide (LASIX) tablet 40 mg  40 mg Oral Daily PRN Deeann Saint, MD      . HYDROcodone-acetaminophen [facility]) 7.5-325 MG per tablet 1-2 tablet  1-2 tablet Oral Q4H PRN Deeann Saint, MD   2 tablet at 04/14/16 1334  . ibuprofen (ADVIL,MOTRIN) tablet 600 mg  600 mg Oral Q6H PRN Yevette Edwards, MD      . magnesium hydroxide (MILK OF MAGNESIA) suspension 30 mL  30 mL Oral Daily PRN Deeann Saint, MD      . menthol-cetylpyridinium (CEPACOL) lozenge 3 mg  1 lozenge Oral PRN Deeann Saint, MD       Or  . phenol (CHLORASEPTIC) mouth spray 1 spray  1 spray Mouth/Throat PRN Deeann Saint, MD      . methocarbamol (ROBAXIN) tablet 500 mg  500 mg Oral Q6H PRN Deeann Saint, MD       Or  . methocarbamol (ROBAXIN) 500 mg in dextrose 5 % 50 mL IVPB  500 mg Intravenous Q6H PRN Deeann Saint, MD      . metoCLOPramide (REGLAN) tablet 5-10 mg  5-10 mg Oral Q8H PRN Deeann Saint, MD       Or  . metoCLOPramide (REGLAN) injection 5-10 mg  5-10 mg Intravenous Q8H PRN Deeann Saint, MD      . morphine 2 MG/ML injection 2 mg  2 mg Intravenous Q2H PRN Deeann Saint, MD   2 mg at 04/14/16 1531  . nalbuphine (NUBAIN) injection 5 mg  5 mg Intravenous Q4H PRN Yevette Edwards, MD       Or  . nalbuphine (NUBAIN) injection 5 mg  5 mg Subcutaneous Q4H PRN Yevette Edwards, MD      . nalbuphine (NUBAIN) injection 5 mg  5 mg Intravenous Once PRN Yevette Edwards, MD       Or  . nalbuphine (NUBAIN) injection 5 mg  5 mg Subcutaneous Once PRN Yevette Edwards, MD      . naloxone [facility]) 2 mg in dextrose 5 % 250 mL infusion  1-4 mcg/kg/hr Intravenous Continuous PRN  Yevette EdwardsJames G Adams, MD      . naloxone [facility](NARCAN) injection 0.4 mg  0.4 mg Intravenous PRN Yevette EdwardsJames G Adams, MD       And  . sodium chloride flush (NS) 0.9 % injection 3 mL  3 mL Intravenous PRN Yevette EdwardsJames G Adams, MD      . ondansetron [facility](ZOFRAN) injection 4 mg  4 mg Intravenous Q8H PRN Yevette EdwardsJames G Adams, MD      . ondansetron [facility](ZOFRAN) tablet 4 mg  4 mg Oral Q6H PRN Deeann SaintHoward Ronan, MD       Or  . ondansetron [facility](ZOFRAN) injection 4 mg  4 mg Intravenous Q6H PRN Deeann SaintHoward Castrogiovanni, MD      . pantoprazole (PROTONIX) EC tablet 40 mg  40 mg Oral Daily Deeann SaintHoward Markin, MD   40 mg at 04/14/16 1334  . scopolamine (TRANSDERM-SCOP) 1 MG/3DAYS 1.5 mg  1 patch Transdermal Once Yevette EdwardsJames G Adams, MD      . senna [facility](SENOKOT) tablet 8.6 mg  1 tablet Oral BID Deeann SaintHoward Domagalski, MD      . sodium chloride flush 0.9 % injection           . sodium phosphate (FLEET) 7-19 GM/118ML enema 1 enema  1 enema Rectal Once PRN Deeann SaintHoward Olds, MD      . topiramate (TOPAMAX) tablet 50 mg  50 mg Oral Daily Deeann SaintHoward Heyden, MD      . vancomycin (VANCOCIN) 1,250 mg in sodium chloride 0.9 % 250 mL IVPB  1,250 mg Intravenous Q12H Deeann SaintHoward Cosma, MD   1,250 mg at 04/14/16 1226  . vancomycin (VANCOCIN) 1-5 GM/200ML-% IVPB              Discharge Medications: Please see discharge summary for a list of discharge medications.  Relevant Imaging Results:  Relevant Lab Results:   Additional Information  (SSN: [id number])  Veleta Yamamoto, Darleen CrockerBailey M, LCSW

## 2016-04-14 NOTE — H&P (Signed)
THE PATIENT WAS SEEN PRIOR TO SURGERY TODAY.  HISTORY, ALLERGIES, HOME MEDICATIONS AND OPERATIVE PROCEDURE WERE REVIEWED. RISKS AND BENEFITS OF SURGERY DISCUSSED WITH PATIENT AGAIN.  NO CHANGES FROM INITIAL HISTORY AND PHYSICAL NOTED.    

## 2016-04-14 NOTE — Anesthesia Preprocedure Evaluation (Signed)
Anesthesia Evaluation    Airway Mallampati: III       Dental  (+) Upper Dentures, Lower Dentures   Pulmonary shortness of breath, with exertion and Long-Term Oxygen Therapy, asthma , resolved, COPD,  COPD inhaler, Current Smoker,    breath sounds clear to auscultation       Cardiovascular  Rhythm:regular Rate:Normal     Neuro/Psych  Headaches, PSYCHIATRIC DISORDERS  Neuromuscular disease    GI/Hepatic GERD  Medicated,  Endo/Other    Renal/GU      Musculoskeletal   Abdominal   Peds  Hematology   Anesthesia Other Findings   Reproductive/Obstetrics                             Anesthesia Physical Anesthesia Plan  ASA: III  Anesthesia Plan: Spinal   Post-op Pain Management:    Induction:   Airway Management Planned:   Additional Equipment:   Intra-op Plan:   Post-operative Plan:   Informed Consent: I have reviewed the patients History and Physical, chart, labs and discussed the procedure including the risks, benefits and alternatives for the proposed anesthesia with the patient or authorized representative who has indicated his/her understanding and acceptance.   Dental Advisory Given  Plan Discussed with:   Anesthesia Plan Comments: (Previous back surgery and hx of moderate to severe pulmonary problems.  S/P clearance.  Will attempt to proceed with SAB.  GA if unsuccessful.  JA)        Anesthesia Quick Evaluation

## 2016-04-14 NOTE — Care Management (Signed)
Patient had elective total knee replacement today.  [place] for [place].  She lives alone and does not have any family support in her city.  She does not have a walker.  Discussed discharge options home with home health vs short term skilled nursing placement and the need  to meet medical necessity that level of care.  Updated CSW of discussion and awaiting documentation of physical therapy recommendation.

## 2016-04-14 NOTE — Evaluation (Signed)
Physical Therapy Evaluation Patient Details Name: Bethany Villa MRN: [id number] DOB: 02/20/1956 Today's Date: 04/14/2016   History of Present Illness  Pt underwent L TKA s/p primary OA; pt has Hx of smoking, COPD, anxiety, fibromyalgia, and depression  Clinical Impression  Bethany Villa is a pleasant 60 y/o female. She is A&O and cooperative with PT. She is mod assist for bed mobility due to Px at operative site; transfer to chair was deferred due to Px being "12/10" at EOB. Pt tolerated ther-ex well, including 15 SLR with min assist. She has knee immobilizer for all weightbearing activities and is PWB as per Dr. Hyacinth MeekerMiller. L Knee AAROM is 4-80 degrees. Pt expresses concern about being discharged home due to having nobody that lives nearby to help her with ADLs/mobility. Pt is appropriate for skilled PT at this time to address deficits in strength, balance, coordination, endurance, gait, activity tolerance, ROM, pain, and safe use of DME. Recommend SNF at this time.     Follow Up Recommendations SNF    Equipment Recommendations  None recommended by PT    Recommendations for Other Services       Precautions / Restrictions Precautions Precautions: Fall Required Braces or Orthoses: Knee Immobilizer - Left Knee Immobilizer - Left: On when out of bed or walking (per verbal order from Dr. Hyacinth MeekerMiller) Restrictions Weight Bearing Restrictions: Yes LLE Weight Bearing: Partial weight bearing LLE Partial Weight Bearing Percentage or Pounds: 50      Mobility  Bed Mobility Overal bed mobility: Needs Assistance Bed Mobility: Supine to Sit     Supine to sit: Mod assist     General bed mobility comments: Pt has sufficient strength and coordination, but requires assist bring L LE to EOB due to Px  Transfers                 General transfer comment: Will assess tomorrow morning due to pt's Px level  Ambulation/Gait             General Gait Details: Will assess tomorrow morning due  to pt's Px level  Stairs            Wheelchair Mobility    Modified Rankin (Stroke Patients Only)       Balance Overall balance assessment: Needs assistance Sitting-balance support: Feet supported;Single extremity supported Sitting balance-Leahy Scale: Good Sitting balance - Comments: Pt able to safely sit at EOB with single UE supported with good postural control                                     Pertinent Vitals/Pain Pain Assessment: 0-10 Pain Score: 6  Pain Location: operative site Pain Descriptors / Indicators: Grimacing;Guarding;Discomfort Pain Intervention(s): Limited activity within patient's tolerance;Monitored during session;Premedicated before session;Patient requesting pain meds-RN notified;Repositioned;Ice applied;Utilized relaxation techniques    Home Living Family/patient expects to be discharged to:: Private residence Living Arrangements: Alone Available Help at Discharge: Family;Friend(s) Type of Home: Apartment Home Access: Stairs to enter;Elevator Entrance Stairs-Rails: Right;Left Entrance Stairs-Number of Steps: 128 but 0 with elevator Home Layout: One level Home Equipment: Emergency planning/management officerhower seat;Walker - 2 wheels      Prior Function Level of Independence: Independent               Hand Dominance        Extremity/Trunk Assessment   Upper Extremity Assessment: Overall WFL for tasks assessed  Lower Extremity Assessment: LLE deficits/detail   LLE Deficits / Details: Pt able to activate quads, able to tolerate ther-ex including 10 SLRs  Cervical / Trunk Assessment: Normal  Communication   Communication: No difficulties  Cognition Arousal/Alertness: Awake/alert Behavior During Therapy: WFL for tasks assessed/performed Overall Cognitive Status: Within Functional Limits for tasks assessed                      General Comments      Exercises Total Joint Exercises Ankle Circles/Pumps: 10  reps;Both;Strengthening;Supine (with manual resistance and verbal cuing) Quad Sets: 10 reps;Both;AROM;Supine (with tactile and verbal cuing) Hip ABduction/ADduction: 10 reps;Left;AAROM;Supine (with min assist and verbal cuing) Straight Leg Raises: 15 reps;Left;AAROM;Seated (with min assist and verbal cuing) Goniometric ROM: L knee AAROM 4-80 degrees      Assessment/Plan    PT Assessment Patient needs continued PT services  PT Diagnosis Difficulty walking;Abnormality of gait;Generalized weakness   PT Problem List Decreased strength;Decreased range of motion;Decreased activity tolerance;Decreased balance;Decreased mobility;Decreased coordination;Decreased knowledge of use of DME;Decreased safety awareness;Decreased knowledge of precautions;Pain  PT Treatment Interventions DME instruction;Gait training;Stair training;Functional mobility training;Therapeutic activities;Therapeutic exercise;Balance training;Neuromuscular re-education   PT Goals (Current goals can be found in the Care Plan section) Acute Rehab PT Goals Patient Stated Goal: Go to rehab PT Goal Formulation: With patient Time For Goal Achievement: 04/28/16 Potential to Achieve Goals: Good    Frequency BID   Barriers to discharge Decreased caregiver support Pt reports she has no assistance at home    Co-evaluation               End of Session Equipment Utilized During Treatment: Gait belt Activity Tolerance: Patient limited by pain Patient left: in bed;with call bell/phone within reach;with bed alarm set;with family/visitor present;with SCD's reapplied Nurse Communication: Mobility status;Patient requests pain meds         Time: 1415-1457 PT Time Calculation (min) (ACUTE ONLY): 42 min   Charges:   PT Evaluation $PT Eval Moderate Complexity: 1 Procedure PT Treatments $Therapeutic Exercise: 8-22 mins   PT G Codes:        Charlotte Brafford 04/14/2016, 5:07 PM  Cassell Smilesevan M Hussain Maimone, SPT [phone]

## 2016-04-14 NOTE — Anesthesia Procedure Notes (Signed)
Spinal  Start time: 04/14/2016 7:33 AM End time: 04/14/2016 7:40 AM Staffing Anesthesiologist: Yevette EdwardsADAMS, JAMES G Resident/CRNA: Irving BurtonBACHICH, Akaiya Touchette Performed: resident/CRNA  Preanesthetic Checklist Completed: patient identified, site marked, surgical consent, pre-op evaluation, IV checked, risks and benefits discussed and monitors and equipment checked Spinal Block Patient position: sitting Prep: Betadine Patient monitoring: heart rate, continuous pulse ox and blood pressure Approach: midline Location: L3-4 Injection technique: single-shot Needle Needle type: [place]  Needle gauge: 25 G

## 2016-04-14 NOTE — Progress Notes (Signed)
Pt alert and oriented, post op day 0 of left total knee replacement, requested if she can have a nicotine patch on. On call ortho MD Martha ClanKrasinski paged and ordered for nicotine patch 14mg  OD. Will administer and continue to monitor.

## 2016-04-14 NOTE — Progress Notes (Signed)
   04/14/16 1500  Clinical Encounter Type  Visited With Patient  Visit Type Initial  Consult/Referral To Chaplain  Chaplain visited with patient and sister - inlaw and offered pastoral care.   Fisher ScientificChaplain Malichi Palardy [phone]

## 2016-04-14 NOTE — Anesthesia Postprocedure Evaluation (Signed)
Anesthesia Post Note  Patient: Bethany Villa  Procedure(s) Performed: Procedure(s) (LRB): TOTAL KNEE ARTHROPLASTY (Left)  Patient location during evaluation: PACU Anesthesia Type: General Level of consciousness: awake and alert Pain management: pain level controlled Vital Signs Assessment: post-procedure vital signs reviewed and stable Respiratory status: spontaneous breathing, nonlabored ventilation, respiratory function stable and patient connected to nasal cannula oxygen Cardiovascular status: blood pressure returned to baseline and stable Postop Assessment: no signs of nausea or vomiting Anesthetic complications: no    Last Vitals:  Vitals:   04/14/16 1157 04/14/16 1227  BP: 122/83 129/76  Pulse: 77 80  Resp: 18 18  Temp: 36.4 C 36.5 C    Last Pain:  Vitals:   04/14/16 1232  TempSrc:   PainSc: 5                  Yevette EdwardsJames G Adams

## 2016-04-15 ENCOUNTER — Encounter: Payer: Self-pay | Admitting: Specialist

## 2016-04-15 ENCOUNTER — Encounter
Admission: RE | Admit: 2016-04-15 | Discharge: 2016-04-15 | Disposition: A | Discharge status: Home / Self Care | Payer: Medicare Other | Source: Ambulatory Visit | Attending: Internal Medicine | Admitting: Internal Medicine

## 2016-04-15 LAB — BASIC METABOLIC PANEL
Anion gap: 3 — ABNORMAL LOW (ref 5–15)
BUN: 10 mg/dL (ref 6–20)
CHLORIDE: 107 mmol/L (ref 101–111)
CO2: 33 mmol/L — AB (ref 22–32)
Calcium: 8.3 mg/dL — ABNORMAL LOW (ref 8.9–10.3)
Creatinine, Ser: 0.71 mg/dL (ref 0.44–1.00)
GFR calc non Af Amer: >60 mL/min (ref >60)
Glucose, Bld: 88 mg/dL (ref 65–99)
POTASSIUM: 4.6 mmol/L (ref 3.5–5.1)
SODIUM: 143 mmol/L (ref 135–145)

## 2016-04-15 LAB — CBC
HEMATOCRIT: 42.3 % (ref 35.0–47.0)
HEMOGLOBIN: 14.1 g/dL (ref 12.0–16.0)
MCH: 30.9 pg (ref 26.0–34.0)
MCHC: 33.4 g/dL (ref 32.0–36.0)
MCV: 92.4 fL (ref 80.0–100.0)
Platelets: 131 10*3/uL — ABNORMAL LOW (ref 150–440)
RBC: 4.58 MIL/uL (ref 3.80–5.20)
RDW: 14.3 % (ref 11.5–14.5)
WBC: 5.7 10*3/uL (ref 3.6–11.0)

## 2016-04-15 MED ORDER — CLINDAMYCIN HCL 150 MG PO CAPS
150.0000 mg | ORAL_CAPSULE | Freq: Three times a day (TID) | ORAL | Status: AC
  Administered 2016-04-15 – 2016-04-18 (×9): 150 mg via ORAL
  Filled 2016-04-15 (×13): qty 1

## 2016-04-15 NOTE — Care Management (Signed)
Anticipate transfer to skilled nursing for short term rehab

## 2016-04-15 NOTE — Progress Notes (Signed)
Physical Therapy Treatment Patient Details Name: Basilio Cairoenny E Fuhriman MRN: [id number] DOB: 10/20/1955 Today's Date: 04/15/2016    History of Present Illness Pt underwent L TKA s/p primary OA; pt has Hx of smoking, COPD, anxiety, fibromyalgia, and depression    PT Comments    Pt made great progress this AM; she conducted all functional mobility with min assist and ambulated 50' with RW and knee immobilizer. Pt was able to ambulate to the bathroom and transfer on and off of the commode; gait pattern was step-to, heavily antalgic, with max reliance on RW. Pt is appropriate for continued PT at this time to address deficits in strength, balance, coordination, ROM, pain, endurance, gait, activity tolerance, safety awareness, and safe use of DME.    Follow Up Recommendations  SNF     Equipment Recommendations  None recommended by PT    Recommendations for Other Services       Precautions / Restrictions Precautions Precautions: Fall Required Braces or Orthoses: Knee Immobilizer - Left Knee Immobilizer - Left: On when out of bed or walking Restrictions Weight Bearing Restrictions: Yes LLE Weight Bearing: Partial weight bearing LLE Partial Weight Bearing Percentage or Pounds: 50    Mobility  Bed Mobility Overal bed mobility: Needs Assistance Bed Mobility: Supine to Sit     Supine to sit: Min assist     General bed mobility comments: Pt needed less assist today, but requires assist to bring L LE to EOB due to Px  Transfers Overall transfer level: Needs assistance Equipment used: Rolling walker (2 wheeled) Transfers: Sit to/from Stand Sit to Stand: Min assist         General transfer comment: Pt is able to stand with min assist with RW, requires cuing for safe hand and foot placement, can be impulsive, needs simple instructions  Ambulation/Gait Ambulation/Gait assistance: Min assist Ambulation Distance (Feet): 50 Feet Assistive device: Rolling walker (2 wheeled) Gait  Pattern/deviations: Step-to pattern;Decreased stride length;Antalgic   Gait velocity interpretation: <1.8 ft/sec, indicative of risk for recurrent falls General Gait Details: Pt's gait mechanics improved throughout session, she is still very antalgic and puts max weight through UEs; able to use step through and less antalgic pattern with cues   Stairs            Wheelchair Mobility    Modified Rankin (Stroke Patients Only)       Balance Overall balance assessment: Needs assistance Sitting-balance support: Single extremity supported Sitting balance-Leahy Scale: Good Sitting balance - Comments: Pt able to safely sit at EOB with single UE supported with good postural control   Standing balance support: Bilateral upper extremity supported Standing balance-Leahy Scale: Fair Standing balance comment: Pt had good upright posture in standing with RW, cannot tolerate perturbation, but can stand safely with CGA                    Cognition Arousal/Alertness: Awake/alert Behavior During Therapy: WFL for tasks assessed/performed Overall Cognitive Status: Within Functional Limits for tasks assessed                      Exercises Total Joint Exercises Ankle Circles/Pumps: 20 reps;Both;Strengthening;Seated (manual resistance with verbal cuing) Quad Sets: 15 reps;Both;AROM;Supine (with tactile and verbal cuing) Heel Slides: 5 reps;Left;AAROM;Seated (min assist with verbal/tactile cuing) Straight Leg Raises: 10 reps;Left;AAROM;Seated (min assist with verbal/tactile cuing) Goniometric ROM: L knee AROM 4-74 degrees Marching in Standing: 10 reps;Both;AROM;Standing (with RW, verbal cuing and CGA) Other Exercises Other Exercises: Therepeutic activity  to utilize the bathroom including patient education for adaptive equipment, safe technique for hand and foot placement with transfer on and off commode X 10 minutes    General Comments General comments (skin integrity, edema,  etc.): Noticable swelling in L foot/ ankle      Pertinent Vitals/Pain Pain Assessment: Faces Faces Pain Scale: Hurts even more Pain Location: anterior knee Pain Descriptors / Indicators: Discomfort;Grimacing;Guarding Pain Intervention(s): Limited activity within patient's tolerance;Monitored during session;Premedicated before session;Repositioned;Ice applied    Home Living                      Prior Function            PT Goals (current goals can now be found in the care plan section) Acute Rehab PT Goals Patient Stated Goal: Go to rehab PT Goal Formulation: With patient Time For Goal Achievement: 04/28/16 Potential to Achieve Goals: Good Progress towards PT goals: Progressing toward goals    Frequency  BID    PT Plan Current plan remains appropriate    Co-evaluation             End of Session Equipment Utilized During Treatment: Gait belt Activity Tolerance: Patient tolerated treatment well Patient left: in chair;with call bell/phone within reach;with chair alarm set;with SCD's reapplied;with family/visitor present     Time: [id number] PT Time Calculation (min) (ACUTE ONLY): 49 min  Charges:                       G Codes:      Jauan Wohl 04/15/2016, 10:12 AM  Cassell Smilesevan M Janisha Bueso, SPT [phone]

## 2016-04-15 NOTE — Evaluation (Signed)
Occupational Therapy Evaluation Patient Details Name: Basilio Cairoenny E Voiles MRN: [id number] DOB: 03/19/1956 Today's Date: 04/15/2016    History of Present Illness [facility] with a L [facility]. Pt has Hx of COPD, anxiety, fibromyalgia, and depression.   Clinical Impression   Pt. Is a 60 y.o. Female who was admitted for a Left TKR. Pt presents with limited ROM, 10/10 Pain, weakness, and impaired functional mobility which hinder her ability to complete ADL and IADL tasks. Pt. could benefit from skilled OT services to review A/E use for LE ADLs, to review necessary home modifications, and to improve functional mobility for ADL/IADLs in order to work towards regaining Independence with ADL/IADLs.     Follow Up Recommendations       Equipment Recommendations       Recommendations for Other Services       Precautions / Restrictions Precautions Precautions: Fall Required Braces or Orthoses: Knee Immobilizer - Left Knee Immobilizer - Left: On when out of bed or walking Restrictions Weight Bearing Restrictions: Yes LLE Weight Bearing: Partial weight bearing LLE Partial Weight Bearing Percentage or Pounds: 50      Mobility Bed Mobility   Transfers  Mobility deferred to PT secondary pt. Reports of 10/10 pain                                     ADL Overall ADL's : Needs assistance/impaired Eating/Feeding: Set up   Grooming: Set up               Lower Body Dressing: Maximal assistance                 General ADL Comments: Pt. education was provided about A/E use for LE dressing skills. Pt. education was provided about Home-setup, and DME use in the bathroom.     Vision     Perception     Praxis      Pertinent Vitals/Pain Pain Assessment: 0-10 Pain Score: 10-Worst pain ever Faces Pain Scale: Hurts even more Pain Location: Left knee Pain Descriptors / Indicators: Discomfort;Grimacing;Guarding Pain Intervention(s): Limited activity within  patient's tolerance;Repositioned     Hand Dominance     Extremity/Trunk Assessment Upper Extremity Assessment Upper Extremity Assessment: Overall WFL for tasks assessed           Communication Communication Communication: No difficulties   Cognition Arousal/Alertness: Awake/alert Behavior During Therapy: WFL for tasks assessed/performed Overall Cognitive Status: Within Functional Limits for tasks assessed                     General Comments       Exercises   Shoulder Instructions      Home Living Family/patient expects to be discharged to:: Private residence Living Arrangements: Alone Available Help at Discharge: Family;Friend(s) Type of Home: Apartment Home Access: Stairs to enter;Elevator   Entrance Stairs-Rails: Right;Left Home Layout: One level     Bathroom Shower/Tub: Tub/shower unit Shower/tub characteristics: Engineer, building servicesCurtain Bathroom Toilet: Standard Bathroom Accessibility: Yes   Home Equipment: Emergency planning/management officerhower seat;Walker - 2 wheels          Prior Functioning/Environment Level of Independence: Independent with assistive device(s)             OT Diagnosis:     OT Problem List:     OT Treatment/Interventions:      OT Goals(Current goals can be found in the care plan section) Acute Rehab  OT Goals Patient Stated Goal: Go to rehab  OT Frequency:     Barriers to D/C:            Co-evaluation              End of Session    Activity Tolerance:  Limited by 10/10 pain Patient left:  In the chair, with call bell in within reach, with a visitor present   Time: 1007-1040 OT Time Calculation (min): 33 min Charges:  OT General Charges $OT Visit: 1 Procedure OT Treatments $Self Care/Home Management : 8-22 mins G-Codes:    Olegario MessierElaine Orlandria Kissner, MS, OTR/L 04/15/2016, 11:29 AM

## 2016-04-15 NOTE — Discharge Planning (Signed)
Patient showing great concern for [place] [facility] If possible please set up RN, PT and OT.  Asking for High rise toilet seat and walker with bench.  Patient will also need assistance from OT to get another bath chair that sits over tub.

## 2016-04-15 NOTE — Progress Notes (Signed)
Subjective: 1 Day Post-Op Procedure(s) (LRB): TOTAL KNEE ARTHROPLASTY (Left)    Patient reports pain as mild. Doing well with PT.  Needs to go to SNF short term.  Will add po antibiotics due to dependent rubor in lower leg.   Objective:   VITALS:   Vitals:   04/15/16 0546 04/15/16 0800  BP: 127/76 122/82  Pulse: 98 92  Resp: 19 18  Temp: 98 F (36.7 C) 98.2 F (36.8 C)    Neurologically intact Neurovascular intact Sensation intact distally Intact pulses distally No cellulitis present dressing dry  LABS  Recent Labs  04/14/16 1233 04/15/16 0506  HGB 14.2 14.1  HCT 41.9 42.3  WBC 6.2 5.7  PLT 129* 131*     Recent Labs  04/14/16 1233 04/15/16 0506  NA  --  143  K  --  4.6  BUN  --  10  CREATININE 0.70 0.71  GLUCOSE  --  88    No results for input(s): LABPT, INR in the last 72 hours.   Assessment/Plan: 1 Day Post-Op Procedure(s) (LRB): TOTAL KNEE ARTHROPLASTY (Left)   Advance diet Up with therapy D/C IV fluids Discharge to SNF

## 2016-04-15 NOTE — Clinical Social Work Note (Signed)
Clinical Social Work Assessment  Patient Details  Name: Bethany Villa MRN: [id number] Date of Birth: Apr 27, 1956  Date of referral:  04/15/16               Reason for consult:  Facility Placement                Permission sought to share information with:  Chartered certified accountant granted to share information::  Yes, Verbal Permission Granted  Name::      [place]::   [place]   Relationship::     Contact Information:     Housing/Transportation Living arrangements for the past 2 months:  [place] of Information:  Patient Patient Interpreter Needed:  None Criminal Activity/Legal Involvement Pertinent to Current Situation/Hospitalization:  No - Comment as needed Significant Relationships:  Siblings Lives with:  Self Do you feel safe going back to the place where you live?  Yes Need for family participation in patient care:  No (Coment)  Care giving concerns:  Patient lives alone in [place] [facility]).    Social Worker assessment / plan:  Holiday representative (CSW) received SNF consult. PT is recommending SNF. CSW met with patient at bedside and her pastor was present. Patient gave her pastor permission to stay during assessment. CSW introduced self and explained role of CSW department. Patient was alert and oriented and was sitting up in the chair. Per patient she lives in [place] and her family is in [place]. CSW explained SNF process. Patient prefers to go to SNF in [place]. CSW presented bed offers. Patient chose [facility]. Kim admissions coordinator at [facility] is aware of accepted bed offer. CSW will continue to follow and assist as needed.   Employment status:  Retired Nurse, adult PT Recommendations:  [place] / Referral to community resources:  [place]  Patient/Family's Response to care:  Patient is agreeable to going  to [employer] [facility]   Patient/Family's Understanding of and Emotional Response to Diagnosis, Current Treatment, and Prognosis:  Patient was pleasant and thanked CSW for visit.   Emotional Assessment Appearance:  Appears stated age Attitude/Demeanor/Rapport:    Affect (typically observed):  Accepting, Adaptable, Pleasant Orientation:  Oriented to Self, Oriented to Place, Oriented to  Time, Oriented to Situation Alcohol / Substance use:  Not Applicable Psych involvement (Current and /or in the community):  No (Comment)  Discharge Needs  Concerns to be addressed:  Discharge Planning Concerns Readmission within the last 30 days:  No Current discharge risk:  Dependent with Mobility Barriers to Discharge:  Continued Medical Work up   [employer], Veronia Beets, LCSW 04/15/2016, 11:58 AM

## 2016-04-15 NOTE — Care Management (Signed)
CM spoke with patient this morning regarding skilled nursing recommendation and was informed this afternoon, physical therapy has a different recommendation.  Left message for therapy to discuss.

## 2016-04-15 NOTE — Clinical Social Work Placement (Signed)
   CLINICAL SOCIAL WORK PLACEMENT  NOTE  Date:  04/15/2016  Patient Details  Name: Bethany Villa MRN: [id number] Date of Birth: 03/08/1956  Clinical Social Work is seeking post-discharge placement for this patient at the Skilled  Nursing Facility level of care (*CSW will initial, date and re-position this form in  chart as items are completed):  Yes   Patient/family provided with [facility] Clinical Social Work Department's list of facilities offering this level of care within the geographic area requested by the patient (or if unable, by the patient's family).  Yes   Patient/family informed of their freedom to choose among providers that offer the needed level of care, that participate in Medicare, Medicaid or managed care program needed by the patient, have an available bed and are willing to accept the patient.  Yes   Patient/family informed of [facility]'s ownership interest in [facility] and [facility], as well as of the fact that they are under no obligation to receive care at these facilities.  PASRR submitted to EDS on 04/15/16     PASRR number received on 04/15/16     Existing PASRR number confirmed on       FL2 transmitted to all facilities in geographic area requested by pt/family on 04/15/16     FL2 transmitted to all facilities within larger geographic area on       Patient informed that his/her managed care company has contracts with or will negotiate with certain facilities, including the following:        Yes   Patient/family informed of bed offers received.  Patient chooses bed at  [facility]     Physician recommends and patient chooses bed at      Patient to be transferred to   on  .  Patient to be transferred to facility by       Patient family notified on   of transfer.  Name of family member notified:        PHYSICIAN       Additional Comment:    _______________________________________________ Aitanna Haubner, Darleen CrockerBailey M,  LCSW 04/15/2016, 11:56 AM

## 2016-04-15 NOTE — Progress Notes (Signed)
Physical Therapy Treatment Patient Details Name: Basilio Cairoenny E Ahrendt MRN: [id number] DOB: 03/26/1956 Today's Date: 04/15/2016    History of Present Illness Pt was [facility] to [facility] with a L TKA. Pt has Hx of COPD, anxiety, fibromyalgia, and depression.    PT Comments    Pt is progressing towards her goals. When PT entered the room the pt was standing at her sink without knee immobilizer with nurse assistant supervising. Per conversation with Dr. Hyacinth MeekerMiller pt was permitted to gait train with PT this afternoon without knee immobilizer. Paged Dr. Hyacinth MeekerMiller to ask about updating orders for knee immobilizer with no call-back; until further clarification with Dr. Hyacinth MeekerMiller resume knee immobilizer when out of bed or weight bearing. Pt received gait training X 170' with RW and CGA with no increase in Px reported. Pt's gait mechanics are improving to reciprocal pattern with less antalgic compensation. Pt is very receptive to pt-education for safe and effective technique; she tolerates ther-ex very well and is self motivated. Due to pt's rate of progress PT now recommends HHPT.   Follow Up Recommendations  Home health PT     Equipment Recommendations  None recommended by PT    Recommendations for Other Services       Precautions / Restrictions Precautions Precautions: Fall Precaution Comments: Use RW and gait belt at all times Required Braces or Orthoses: Knee Immobilizer - Left Knee Immobilizer - Left: On when out of bed or walking Restrictions Weight Bearing Restrictions: Yes LLE Weight Bearing: Partial weight bearing LLE Partial Weight Bearing Percentage or Pounds: 50    Mobility  Bed Mobility Overal bed mobility: Needs Assistance Bed Mobility: Supine to Sit     Supine to sit: Min assist     General bed mobility comments: Pt needs minimal help to mover her operative leg to EOB, mostly to minimize Px  Transfers Overall transfer level: Needs assistance Equipment used: Rolling walker (2  wheeled) Transfers: Sit to/from Stand Sit to Stand: Min guard         General transfer comment: Pt is able to stand with CGA; she requires some cuing for optimal mechanics and safe technique; pt shows good understanding and retention   Ambulation/Gait Ambulation/Gait assistance: Min guard Ambulation Distance (Feet): 170 Feet Assistive device: Rolling walker (2 wheeled) Gait Pattern/deviations: Step-through pattern;Antalgic     General Gait Details: Pt is showing improved gait mechanics and activity tolerance. She is receptive to cues for reciprocal gait and reports her knee feels better with reciprocal gait. Received extensive pt education for safe and efficient technique with good understanding   Stairs            Wheelchair Mobility    Modified Rankin (Stroke Patients Only)       Balance Overall balance assessment: Needs assistance Sitting-balance support: No upper extremity supported Sitting balance-Leahy Scale: Good Sitting balance - Comments: Pt able to safely sit at EOB with good postural control   Standing balance support: Bilateral upper extremity supported Standing balance-Leahy Scale: Fair Standing balance comment: Pt had good upright posture in standing with RW, cannot tolerate perturbation, but can stand safely with CGA                    Cognition Arousal/Alertness: Awake/alert Behavior During Therapy: WFL for tasks assessed/performed Overall Cognitive Status: Within Functional Limits for tasks assessed                      Exercises Total Joint Exercises Ankle Circles/Pumps:  15 reps;Both;Strengthening;Supine (manual resistance with verbal cuing) Quad Sets: 15 reps;Both;AROM;Supine (with supervision and verbal cuing) Straight Leg Raises: 10 reps;Left;AROM;Seated (with tactile and verbal cuing) Long Arc Quad: 10 reps;Left;AROM;Seated (with min assist, tactile and verbal cues)    General Comments        Pertinent Vitals/Pain Pain  Assessment: Faces Faces Pain Scale: Hurts even more Pain Location: anterior knee Pain Descriptors / Indicators: Discomfort;Grimacing;Guarding Pain Intervention(s): Limited activity within patient's tolerance;Monitored during session;Premedicated before session;Ice applied;Utilized relaxation techniques    Home Living                      Prior Function            PT Goals (current goals can now be found in the care plan section) Acute Rehab PT Goals Patient Stated Goal: Go to rehab PT Goal Formulation: With patient Time For Goal Achievement: 04/28/16 Potential to Achieve Goals: Good Progress towards PT goals: Progressing toward goals    Frequency  BID    PT Plan Current plan remains appropriate    Co-evaluation             End of Session Equipment Utilized During Treatment: Gait belt Activity Tolerance: Patient tolerated treatment well Patient left: in bed;with call bell/phone within reach;with bed alarm set;with SCD's reapplied     Time: 4098-11911408-1449 PT Time Calculation (min) (ACUTE ONLY): 41 min  Charges:                       G Codes:      Anhelica Fowers 04/15/2016, 4:42 PM  Cassell Smilesevan M Kimball Manske, SPT [phone]

## 2016-04-15 NOTE — Care Management (Signed)
Paged Dr Hyacinth MeekerMiller to discussed discharge disposition.  Did not receive return call so left message at MD office.  Informed patient of the recommendation.  She is tearful. Says she has to go to skilled nursing because she  does not have help at home and 'does not have a plan B."  Informed by staff that patient has been getting up in her room independently.  Have contacted Tim  with [facility] [place]- [place]  - for SN PT Aide and SW.

## 2016-04-15 NOTE — Plan of Care (Signed)
LM for Tens Unit rep requesting unit to be brought to room.

## 2016-04-16 LAB — CBC
HCT: 42.2 % (ref 35.0–47.0)
Hemoglobin: 14 g/dL (ref 12.0–16.0)
MCH: 30.6 pg (ref 26.0–34.0)
MCHC: 33.2 g/dL (ref 32.0–36.0)
MCV: 92.2 fL (ref 80.0–100.0)
PLATELETS: 132 10*3/uL — AB (ref 150–440)
RBC: 4.57 MIL/uL (ref 3.80–5.20)
RDW: 14 % (ref 11.5–14.5)
WBC: 6.6 10*3/uL (ref 3.6–11.0)

## 2016-04-16 MED ORDER — OXYCODONE HCL 5 MG PO TABS
10.0000 mg | ORAL_TABLET | ORAL | Status: DC | PRN
  Administered 2016-04-16 (×2): 10 mg via ORAL
  Administered 2016-04-16 – 2016-04-17 (×2): 15 mg via ORAL
  Administered 2016-04-17 (×3): 10 mg via ORAL
  Administered 2016-04-17: 15 mg via ORAL
  Administered 2016-04-17 – 2016-04-18 (×6): 10 mg via ORAL
  Filled 2016-04-16 (×3): qty 2
  Filled 2016-04-16: qty 1
  Filled 2016-04-16: qty 3
  Filled 2016-04-16 (×4): qty 2
  Filled 2016-04-16: qty 1
  Filled 2016-04-16 (×2): qty 2
  Filled 2016-04-16: qty 3
  Filled 2016-04-16 (×2): qty 2
  Filled 2016-04-16: qty 3

## 2016-04-16 NOTE — Progress Notes (Signed)
Occupational Therapy Treatment Patient Details Name: Bethany Villa MRN: [id number] DOB: 01/31/1956 Today's Date: 04/16/2016    History of present illness [facility]. Pt has Hx of COPD, anxiety, fibromyalgia, and depression.   OT comments  Pt. conitnues to present with pain (10/10), impaired functional mobility, and weakness which hinders her ability to complete ADL functioning. Pt. Continues to benefit from skilled OT intervention for ADL, and A/E training, pt. education about DME, home modification, energy conservation, and work simplification techniques in order to improve ADL/IADL functioning, and return to independent living. Pt. Discharge plans have changed. Pt. Reports she will be discharging between her siter's home, and her friends home. Her sister's home has a large dog they are concerned about, however has a large bathroom. Her friend's home has a small bathroom , and a small dog. Pt. Education was provided about DME for home, and walker attachments. Pt. education was provided about performing supine to sit, and strategies/equipment to assist with it at home. Pt. Education was provided about donning and doffing the knee immobilizer safely, as well as managing the polar care unit at home.    Follow Up Recommendations  Home health OT    Equipment Recommendations   (walker basket, reacher.)    Recommendations for Other Services PT consult    Precautions / Restrictions Precautions Precautions: Fall Required Braces or Orthoses: Knee Immobilizer - Left Knee Immobilizer - Left: On when out of bed or walking Restrictions Weight Bearing Restrictions: Yes LLE Weight Bearing: Partial weight bearing LLE Partial Weight Bearing Percentage or Pounds: 50       Mobility Bed Mobility Overal bed mobility: Modified Independent Bed Mobility: Supine to Sit     Supine to sit: Modified independent (Device/Increase time)     General bed mobility comments: Sit to  supine: Pt. required minA with trapeze bar, and bed rail. Pt. is concerned about getting in an out of a higher flat bed. Pt. education was provided about various DME/devices available to assist with bed mobility.   Transfers Overall transfer level: Needs assistance Equipment used: Rolling walker (2 wheeled)   Sit to Stand: Min guard         General transfer comment: Verbal cues were required for hand placement.    Balance     Sitting balance-Leahy Scale: Normal       Standing balance-Leahy Scale: Good (with walker)                     ADL   Eating/Feeding: Set up   Grooming: Set up;Standing               Lower Body Dressing: Moderate assistance   Toilet Transfer: Min guard   Toileting- ArchitectClothing Manipulation and Hygiene: Independent       Functional mobility during ADLs: Min guard General ADL Comments: Pt. education was provided about A/E use for LE dressing. Reviewed donning, and doffing knee immobilizer.      Vision                     Perception     Praxis      Cognition   Behavior During Therapy: [facility] for tasks assessed/performed Overall Cognitive Status: Within Functional Limits for tasks assessed                       Extremity/Trunk Assessment  Exercises     Shoulder Instructions       General Comments      Pertinent Vitals/ Pain       Pain Assessment: 0-10 Pain Score: 10-Worst pain ever Pain Location: knee Pain Descriptors / Indicators: Throbbing Pain Intervention(s): Limited activity within patient's tolerance;Monitored during session;Repositioned;RN gave pain meds during session  Home Living                                          Prior Functioning/Environment              Frequency Min 1X/week     Progress Toward Goals  OT Goals(current goals can now be found in the care plan section)  Progress towards OT goals: Progressing toward goals  Acute Rehab OT  Goals Patient Stated Goal: To regain independence  Plan Discharge plan remains appropriate    Co-evaluation                 End of Session Equipment Utilized During Treatment: Gait belt   Activity Tolerance Patient tolerated treatment well   Patient Left in bed;with call bell/phone within reach;with bed alarm set   Nurse Communication          Time: 1191-47821415-1515 OT Time Calculation (min): 60 min  Charges: OT General Charges $OT Visit: 1 Procedure OT Treatments $Self Care/Home Management : 53-67 mins  Olegario MessierElaine Natalyia Innes, MS, OTR/L 04/16/2016, 3:51 PM

## 2016-04-16 NOTE — Care Management Important Message (Signed)
Important Message  Patient Details  Name: Bethany Villa MRN: [id number] Date of Birth: 11/24/1955   Medicare Important Message Given:  Yes    Adonis HugueninBerkhead, Gaynor Genco L, RN 04/16/2016, 10:17 AM

## 2016-04-16 NOTE — Care Management (Addendum)
Spoke with Mardella LaymanLindsey at Dr Garen LahMillers office to discuss discharge plan. Informed that patient is planning to stay with her sister and that she walked over 170 feet with PT today. Again, patient is doing too well to be referred to SNF and have discussed this with Dr Hyacinth MeekerMiller. OT will work with patient and also PT again this afternoon.  [facility]letha Grippee[facility]ah Kaczmarek [phone]

## 2016-04-16 NOTE — Progress Notes (Signed)
  Subjective:  POD #2 s/p left TKA Patient reports left knee pain as severe.  She states that she has a history of chronic pain and fibromyalgia. She states that she was not on narcotics at home prior to surgery. She is seen with a friend at the bedside. She is up out of bed to a chair. She has been able to get up with physical therapy today. She has no other complaints.  Objective:   VITALS:   Vitals:   04/15/16 2044 04/16/16 0413 04/16/16 0415 04/16/16 0725  BP: 139/72 (!) 133/115 125/89 127/79  Pulse: (!) 115 (!) 103 100 89  Resp: 18 19  18   Temp: 98 F (36.7 C) 98 F (36.7 C)  99.1 F (37.3 C)  TempSrc: Oral Oral  Oral  SpO2: 98% 100%  97%  Weight:      Height:        PHYSICAL EXAM:  Left lower extremity: Patient's bandage is clean dry and intact. Polar Care sleeve is in place. Patient has a knee immobilizer in place and she is sitting in her chair. She has palpable pedal pulses, intact sensation light touch throughout the left lower extremity and intact motor function distally.   LABS  Results for orders placed or performed during the hospital encounter of 04/14/16 (from the past 24 hour(s))  CBC     Status: Abnormal   Collection Time: 04/16/16  4:30 AM  Result Value Ref Range   WBC 6.6 3.6 - 11.0 K/uL   RBC 4.57 3.80 - 5.20 MIL/uL   Hemoglobin 14.0 12.0 - 16.0 g/dL   HCT 16.142.2 09.635.0 - 04.547.0 %   MCV 92.2 80.0 - 100.0 fL   MCH 30.6 26.0 - 34.0 pg   MCHC 33.2 32.0 - 36.0 g/dL   RDW 40.914.0 81.111.5 - 91.414.5 %   Platelets 132 (L) 150 - 440 K/uL    No results found.  Assessment/Plan: 2 Days Post-Op   Active Problems:   Total knee replacement status  Patient's main issue postoperatively as pain. To address her pain, I'm going to switch her from Norco to oxycodone.  She is making good progress with physical therapy and they are recommending that she be discharged home.  I expect discharge home to either be Saturday or Sunday.   Juanell FairlyKRASINSKI, Jamian Andujo , MD 04/16/2016, 1:33  PM

## 2016-04-16 NOTE — Care Management Note (Signed)
Case Management Note  Patient Details  Name: Bethany Villa MRN: [id number] Date of Birth: 05/19/1956  Subjective/Objective:     Spoke with patient for continued discharge planning. Patient is from home alone. Patient became tearful and stated that she did not have anyone there at home that could stay with her for 24 hours at a time.  Patient stated that she does have a rolling walker already and that she would need at shower bench and also  A 3 in  1 chair. I have ordered these through [facility]. Patient will need to make arrangements for someone to pick up shower bench which is not covered by her insurance at a cost of $60.. Patient stated that she has a shower chair already but that it is hard for her to get into the tub.  Patient stated that she has a sister and a friend but that they cannot be there at her home all the time. Urged patient to make arrangements for family to stay with her.  Patient is not recommended for SNF by PT and therefore insurance will not pay for stay. Patient expressed understanding of this. I have discussed this with Dr Hyacinth MeekerMiller and explained situation. [facility] and referral has been placed with Anibal Hendersonim Henderson. [facility] can accept patient. [facility].        Action/Plan: Anticipated discharge is 04/17/2016 Home with HH.   Expected Discharge Date:                  Expected Discharge Plan:  Home w Home Health Services  In-House Referral:     Discharge planning Services  CM Consult  Post Acute Care Choice:    Choice offered to:  Patient  DME Arranged:    [facility]  HH Arranged:  PT, Nurse's Aide, Social Work [facility]  Status of Service:  In process, will continue to follow  If discussed at Long Length of Stay Meetings, dates discussed:    Additional Comments:  Adonis HugueninBerkhead,  Swan Fairfax L, RN 04/16/2016, 9:41 AM

## 2016-04-16 NOTE — Progress Notes (Signed)
Physical Therapy Treatment Patient Details Name: Bethany Villa MRN: [id number] DOB: 05/07/1956 Today's Date: 04/16/2016    History of Present Illness [facility] with a L [facility]. Pt has Hx of COPD, anxiety, fibromyalgia, and depression.    PT Comments    Bethany Villa is making great progress towards her goals. She received gait training X 240' with CGA and RW, with standing breaks X 4. Pt is able to conduct all functional mobility with modified independence and minor cuing for technique optimization. Pt had decided to stay with her sister Bethany Villa(Bethany Villa [phone]) in [place] who reports she has 4 stairs without railing, although the pt reports there are only 1-2 stairs, with single level house, and walk-in shower. Pt is appropriate for continued PT at this time to address deficits in strength, balance, coordination, ROM, pain, endurance, gait, activity tolerance, safety awareness, and safe use of DME. Pt is improving and remains appropriate for discharge to home with HHPT.   Follow Up Recommendations  Home health PT     Equipment Recommendations  None recommended by PT    Recommendations for Other Services OT consult     Precautions / Restrictions Precautions Precautions: Fall Precaution Comments: Use RW and gait belt at all times Required Braces or Orthoses: Knee Immobilizer - Left Knee Immobilizer - Left: On when out of bed or walking Restrictions Weight Bearing Restrictions: Yes LLE Weight Bearing: Partial weight bearing LLE Partial Weight Bearing Percentage or Pounds: 50    Mobility  Bed Mobility Overal bed mobility: Modified Independent Bed Mobility: Supine to Sit     Supine to sit: Modified independent (Device/Increase time)     General bed mobility comments: Pt is able to change position in bed independently with increased time and effort. Her knee immobilizer limits her mobility significantly  Transfers Overall transfer level: Modified  independent Equipment used: Rolling walker (2 wheeled) Transfers: Sit to/from Stand Sit to Stand: Min guard         General transfer comment: Pt is able to stand with CGA; she received some cuing for optomised technique, but is able to perform safe technique without cuing.  Ambulation/Gait Ambulation/Gait assistance: Min guard Ambulation Distance (Feet): 240 Feet Assistive device: Rolling walker (2 wheeled) Gait Pattern/deviations: Antalgic;Step-through pattern;Decreased stance time - left Gait velocity: improved   General Gait Details: Pt is showing improved gait mechanics and activity tolerance. She requires minimal cuing for safe and efficient technique.    Stairs 2-4? Pt's report and sister's report differ.     Walk-in shower Single level house Standard toilet         Wheelchair Mobility    Modified Rankin (Stroke Patients Only)       Balance Overall balance assessment: Modified Independent Sitting-balance support: No upper extremity supported Sitting balance-Leahy Scale: Normal Sitting balance - Comments: Pt able to safely sit at EOB with good postural control   Standing balance support: Bilateral upper extremity supported Standing balance-Leahy Scale: Good Standing balance comment: Pt has increased stability, she is able to lean without LOB and can tolerate some chalange.                     Cognition Arousal/Alertness: Awake/alert Behavior During Therapy: WFL for tasks assessed/performed Overall Cognitive Status: Within Functional Limits for tasks assessed                      Exercises Total Joint Exercises Ankle Circles/Pumps: 15 reps;Both;Strengthening;Supine (with manual resistance  and verbal cuing) Quad Sets: 15 reps;Both;AROM;Seated (with tactile and verbal cuing) Heel Slides: 5 reps;Left;AAROM;Seated (with min assist and verbal cuing) Other Exercises Other Exercises: Therepeutic activity to utilize the bathroom including patient  education for adaptive equipment, safe technique for hand and foot placement with transfer on and off commode and self hygiene X 15 minutes. [name].    General Comments        Pertinent Vitals/Pain Pain Assessment: Faces Faces Pain Scale: Hurts even more Pain Location: anterior knee Pain Descriptors / Indicators: Discomfort;Grimacing;Guarding Pain Intervention(s): Limited activity within patient's tolerance;Monitored during session;Premedicated before session;Patient requesting pain meds-RN notified;Ice applied;Utilized relaxation techniques;Relaxation    Home Living                      Prior Function            PT Goals (current goals can now be found in the care plan section) Acute Rehab PT Goals Patient Stated Goal: be independent as possible PT Goal Formulation: With patient Time For Goal Achievement: 04/28/16 Potential to Achieve Goals: Good Progress towards PT goals: Progressing toward goals    Frequency  BID    PT Plan Current plan remains appropriate    Co-evaluation             End of Session Equipment Utilized During Treatment: Gait belt Activity Tolerance: Patient tolerated treatment well Patient left: in chair;with call bell/phone within reach;with SCD's reapplied;with chair alarm set     Time: 6962-95280949-1044 PT Time Calculation (min) (ACUTE ONLY): 55 min  Charges:                       G Codes:      Bethany Villa 04/16/2016, 11:54 AM  Bethany Smilesevan M Kamsiyochukwu Villa, SPT [phone]

## 2016-04-16 NOTE — Progress Notes (Signed)
Physical Therapy Treatment Patient Details Name: Bethany Villa MRN: [id number] DOB: 04/21/1956 Today's Date: 04/16/2016    History of Present Illness [facility]. Pt has Hx of COPD, anxiety, fibromyalgia, and depression.    PT Comments    Treatment emphasis on completing transfers to/from all seating surfaces and flat bed to simulate home environment.  Patient able to safely complete all transfers below without physical assist from therapist (distant sup/mod indep).  Slightly anxious with initial trials, but improved in both comfort and confidence with repetition.  Good effort with all activities. Attempted to initiate stair training, but patient declined this date; prefers to wait until tomorrow AM.  Reports sisters home has 2-3 steps in front with bilat rails, but unsure if rails are close enough to hold both.   Follow Up Recommendations  Home health PT     Equipment Recommendations  3in1 (PT)    Recommendations for Other Services       Precautions / Restrictions Precautions Precautions: Fall Required Braces or Orthoses: Knee Immobilizer - Left Knee Immobilizer - Left: On when out of bed or walking Restrictions Weight Bearing Restrictions: Yes LLE Weight Bearing: Partial weight bearing LLE Partial Weight Bearing Percentage or Pounds: 50    Mobility  Bed Mobility Overal bed mobility: Modified Independent Bed Mobility: Supine to Sit;Sit to Supine     Supine to sit: Modified independent (Device/Increase time)     General bed mobility comments: removed trapeze, bedrails and performed from flat surface to simulate home environment  Transfers Overall transfer level: Modified independent Equipment used: Rolling walker (2 wheeled) Transfers: Sit to/from Stand Sit to Stand: Supervision;Modified independent (Device/Increase time)         General transfer comment: encouraged for more normal paced movement transitions to increase  fluidity  Ambulation/Gait Ambulation/Gait assistance: Supervision;Modified independent (Device/Increase time) Ambulation Distance (Feet): 25 Feet (x4) Assistive device: Rolling walker (2 wheeled)       General Gait Details: step to gait pattern with good demonstration/adherence to PWB L LE.  Good safety and stability; no buckling or LOB   Stairs Stairs:  (patient declined stairs this date, preferring to wait until next AM to complete)          Wheelchair Mobility    Modified Rankin (Stroke Patients Only)       Balance     Sitting balance-Leahy Scale: Normal       Standing balance-Leahy Scale: Good (with walker)                      Cognition Arousal/Alertness: Awake/alert Behavior During Therapy: WFL for tasks assessed/performed Overall Cognitive Status: Within Functional Limits for tasks assessed                      Exercises Other Exercises Other Exercises: Sit/stand from variety of surface heights (chair with armrests, chair without armrests, edge of bed, bedside commode) with RW, distant sup/mod indep to simulate all seating surfaces in home environment.  Patient able to complete all without change in performance or level of assist required.  did discuss increasing speed of movement transition for optimal safety/indep. Other Exercises: Verbally reviewed technique for car transfer; patient voiced understanding.    General Comments        Pertinent Vitals/Pain Pain Assessment: Faces Pain Score: 10-Worst pain ever Faces Pain Scale: Hurts little more Pain Location: L knee Pain Descriptors / Indicators: Aching Pain Intervention(s): Limited activity within  patient's tolerance;Monitored during session;Repositioned;Premedicated before session    Home Living                      Prior Function            PT Goals (current goals can now be found in the care plan section) Acute Rehab PT Goals Patient Stated Goal: To regain  independence PT Goal Formulation: With patient Time For Goal Achievement: 04/28/16 Potential to Achieve Goals: Good Progress towards PT goals: Progressing toward goals    Frequency  BID    PT Plan Current plan remains appropriate    Co-evaluation             End of Session Equipment Utilized During Treatment: Gait belt Activity Tolerance: Patient tolerated treatment well Patient left: in chair;with call bell/phone within reach;with SCD's reapplied;with chair alarm set     Time: [id number] PT Time Calculation (min) (ACUTE ONLY): 39 min  Charges:  $Therapeutic Activity: 38-52 mins                    G Codes:      Bethany Villa 04/16/2016, 4:47 PM

## 2016-04-16 NOTE — Progress Notes (Signed)
PT is now recommending home health. RN Case Manager aware of above. Please reconsult if future social work needs arise. CSW signing off.   Baker Hughes IncorporatedBailey Javier Mamone, LCSW [phone]

## 2016-04-17 LAB — CBC
HCT: 39.1 % (ref 35.0–47.0)
Hemoglobin: 13 g/dL (ref 12.0–16.0)
MCH: 30.6 pg (ref 26.0–34.0)
MCHC: 33.3 g/dL (ref 32.0–36.0)
MCV: 91.7 fL (ref 80.0–100.0)
Platelets: 124 10*3/uL — ABNORMAL LOW (ref 150–440)
RBC: 4.27 MIL/uL (ref 3.80–5.20)
RDW: 14 % (ref 11.5–14.5)
WBC: 6.6 10*3/uL (ref 3.6–11.0)

## 2016-04-17 NOTE — Progress Notes (Signed)
Patient refused suppository today. Gave her prune juice as she requested.

## 2016-04-17 NOTE — Progress Notes (Signed)
Physical Therapy Treatment Patient Details Name: Bethany Villa MRN: [id number] DOB: 01/03/1956 Today's Date: 04/17/2016    History of Present Illness [facility]. Pt has Hx of COPD, anxiety, fibromyalgia, and depression.    PT Comments    Pt in chair ready for session.  She was able to stand and ambulate around nursing unit with walker and supervision.  On return to room she was able to navigate to/from bathroom with walker and supervision to manage needs.  Returned to edge of bed and supine where she completed exercises as described below.  Progressing well with session and increased ambulation distances.  Review stairs in am prior to d/c.   Follow Up Recommendations  Home health PT     Equipment Recommendations  3in1 (PT)    Recommendations for Other Services       Precautions / Restrictions Precautions Precautions: Fall Precaution Comments: Use RW and gait belt at all times Required Braces or Orthoses: Knee Immobilizer - Left Knee Immobilizer - Left: On when out of bed or walking Restrictions Weight Bearing Restrictions: Yes LLE Weight Bearing: Partial weight bearing LLE Partial Weight Bearing Percentage or Pounds: 50    Mobility  Bed Mobility Overal bed mobility: Needs Assistance Bed Mobility: Sit to Supine     Supine to sit: Min assist (needed le assist due to fatigue)     General bed mobility comments: removed trapeze, bedrails and performed from flat surface to simulate home environment  Transfers Overall transfer level: Modified independent Equipment used: Rolling walker (2 wheeled) Transfers: Sit to/from Stand Sit to Stand: Supervision            Ambulation/Gait Ambulation/Gait assistance: Supervision Ambulation Distance (Feet): 200 Feet Assistive device: Rolling walker (2 wheeled) Gait Pattern/deviations: Step-to pattern   Gait velocity interpretation: Below normal speed for age/gender General Gait Details: good safety  and wb maintained   Stairs            Wheelchair Mobility    Modified Rankin (Stroke Patients Only)       Balance Overall balance assessment: Modified Independent Sitting-balance support: Feet supported Sitting balance-Leahy Scale: Normal     Standing balance support: No upper extremity supported Standing balance-Leahy Scale: Good                      Cognition Arousal/Alertness: Awake/alert Behavior During Therapy: WFL for tasks assessed/performed Overall Cognitive Status: Within Functional Limits for tasks assessed                      Exercises Total Joint Exercises Ankle Circles/Pumps: 15 reps;Both;Strengthening;Supine Quad Sets: AROM;Left;10 reps;Supine Heel Slides: AAROM;Left;10 reps;Supine Straight Leg Raises: AROM;Left;10 reps;Supine Long Arc Quad: 10 reps;Left;AROM;Seated Knee Flexion: AROM;Left;10 reps;Seated Goniometric ROM: 2-80 Other Exercises Other Exercises: amb to/from bathroom Other Exercises: LB dressing using Reacher for socks doff and sockaid for donning - SBA  Other Exercises: Albe to use hands for pants - Independent with setup    General Comments        Pertinent Vitals/Pain Pain Assessment: 0-10 Pain Score: 6  Pain Location: L knee  Pain Descriptors / Indicators: Aching Pain Intervention(s): Limited activity within patient's tolerance;Ice applied    Home Living                      Prior Function            PT Goals (current goals can now  be found in the care plan section) Acute Rehab PT Goals Patient Stated Goal: To regain independence Progress towards PT goals: Progressing toward goals    Frequency  BID    PT Plan Current plan remains appropriate    Co-evaluation             End of Session Equipment Utilized During Treatment: Gait belt Activity Tolerance: Patient tolerated treatment well Patient left: in bed;with call bell/phone within reach;with bed alarm set;with SCD's reapplied      Time: 1330-1408 PT Time Calculation (min) (ACUTE ONLY): 38 min  Charges:  $Gait Training: 8-22 mins $Therapeutic Exercise: 8-22 mins                    G Codes:      Bethany Villa 04/17/2016, 2:59 PM

## 2016-04-17 NOTE — Progress Notes (Signed)
Physical Therapy Treatment Patient Details Name: Bethany Villa MRN: [id number] DOB: 03/05/1956 Today's Date: 04/17/2016    History of Present Illness Pt was [facility] with a L TKA. Pt has Hx of COPD, anxiety, fibromyalgia, and depression.    PT Comments    Pt ready for session.  Transitions out of bed and ambulates to rehab gym with supervision.  Some unsteadiness with sit to stand transfer but she is able to control without assist.  Stair training initiated.  She was able to go up stairs with right rail and min verbal cues.  To come down she required mod verbal cues and encouragement due to fear of falling and heights.  While she only required min guard she used both hand rails to come down and had increased anxiety  Which puts her at an increased falls risk.  She will only be able to access one handrail right or left as the steps are too wide to reach both.  After seated rest she was able to walk 100' with walker.  She maintains PWB well with verbal cues.  Will continue this pm for gait and exercise/rom.   Follow Up Recommendations  Home health PT     Equipment Recommendations  3in1 (PT)    Recommendations for Other Services       Precautions / Restrictions Precautions Precautions: Fall Precaution Comments: Use RW and gait belt at all times Required Braces or Orthoses: Knee Immobilizer - Left Knee Immobilizer - Left: On when out of bed or walking Restrictions Weight Bearing Restrictions: Yes LLE Weight Bearing: Partial weight bearing LLE Partial Weight Bearing Percentage or Pounds: 50    Mobility  Bed Mobility Overal bed mobility: Modified Independent Bed Mobility: Supine to Sit     Supine to sit: Modified independent (Device/Increase time)        Transfers Overall transfer level: Modified independent Equipment used: Rolling walker (2 wheeled) Transfers: Sit to/from Stand Sit to Stand: Supervision;Modified independent (Device/Increase time)             Ambulation/Gait Ambulation/Gait assistance: Supervision Ambulation Distance (Feet): 100 Feet Assistive device: Rolling walker (2 wheeled) Gait Pattern/deviations: Step-to pattern   Gait velocity interpretation: <1.8 ft/sec, indicative of risk for recurrent falls General Gait Details: step to gait pattern with good demonstration/adherence to PWB L LE.  Good safety and stability; no buckling or LOB   Stairs Stairs: Yes Stairs assistance: Min guard Stair Management: One rail Right (Bilateral rails down due to fear) Number of Stairs: 4 General stair comments: fearful on stiars particularly when going down which she attributes to fear of heights and prior falls on stairs  Wheelchair Mobility    Modified Rankin (Stroke Patients Only)       Balance Overall balance assessment: Modified Independent Sitting-balance support: Feet supported Sitting balance-Leahy Scale: Normal     Standing balance support: No upper extremity supported Standing balance-Leahy Scale: Good                      Cognition Arousal/Alertness: Awake/alert Behavior During Therapy: WFL for tasks assessed/performed Overall Cognitive Status: Within Functional Limits for tasks assessed                      Exercises      General Comments        Pertinent Vitals/Pain Pain Assessment: 0-10 Pain Score: 6  Pain Location: L knee Pain Descriptors / Indicators: Aching Pain Intervention(s): Limited activity within patient's tolerance;Ice  applied    Home Living                      Prior Function            PT Goals (current goals can now be found in the care plan section) Acute Rehab PT Goals Patient Stated Goal: To regain independence Progress towards PT goals: Progressing toward goals    Frequency  BID    PT Plan Current plan remains appropriate    Co-evaluation             End of Session Equipment Utilized During Treatment: Gait belt Activity Tolerance:  Patient tolerated treatment well Patient left: in chair;with call bell/phone within reach;with SCD's reapplied;with chair alarm set     Time: 1610-96041029-1055 PT Time Calculation (min) (ACUTE ONLY): 26 min  Charges:  $Gait Training: 23-37 mins                    G Codes:      [name] 04/17/2016, 12:19 PM

## 2016-04-17 NOTE — Progress Notes (Signed)
Occupational Therapy Treatment Patient Details Name: Bethany Villa MRN: [id number] DOB: 04/08/1956 Today's Date: 04/17/2016    History of present illness Pt [facility] with a L TKA. Pt has Hx of COPD, anxiety, fibromyalgia, and depression.   OT comments  Pt progressing well- and show increase independency in ADL's with and without AE  and functional mobility in ADL's - pt still emotional about change in discharge plans -  But getting more self confidence and problemsolve some of her issues for discharge at to home    Follow Up Recommendations       Equipment Recommendations       Recommendations for Other Services PT consult    Precautions / Restrictions Precautions Precautions: Fall Precaution Comments: Use RW and gait belt at all times Required Braces or Orthoses: Knee Immobilizer - Left Knee Immobilizer - Left: On when out of bed or walking Restrictions Weight Bearing Restrictions: Yes LLE Weight Bearing: Partial weight bearing LLE Partial Weight Bearing Percentage or Pounds: 50       Mobility Bed Mobility Overal bed mobility: Modified Independent Bed Mobility: Supine to Sit     Supine to sit: Modified independent (Device/Increase time)     General bed mobility comments: removed trapeze, bedrails and performed from flat surface to simulate home environment  Transfers Overall transfer level: Modified independent Equipment used: Rolling walker (2 wheeled) Transfers: Sit to/from Stand Sit to Stand: Supervision;Modified independent (Device/Increase time)              Balance Overall balance assessment: Modified Independent Sitting-balance support: Feet supported Sitting balance-Leahy Scale: Normal     Standing balance support: No upper extremity supported Standing balance-Leahy Scale: Good                     ADL                                                Vision                     Perception      Praxis      Cognition   Behavior During Therapy: WFL for tasks assessed/performed Overall Cognitive Status: Within Functional Limits for tasks assessed                       Extremity/Trunk Assessment               Exercises Other Exercises Other Exercises: ambulate with CG to bathroom - simulate without BSC this date - usign walker , rail ( simulate vanity) - because of pt shorter and report commode she cannot do hygiene - worked much better - and ed on hand placements simuate her sister and friends bathroom -  CG to close S and no LOB -  Therapist, musicclohting negotiations Ind  Other Exercises: LB dressing using Reacher for socks doff and sockaid for donning - SBA  Other Exercises: Albe to use hands for pants - Independent with setup   Shoulder Instructions       General Comments      Pertinent Vitals/ Pain       Pain Assessment: 0-10 Pain Score: 6  Pain Location: L knee Pain Descriptors / Indicators: Aching Pain Intervention(s): Ice applied  Home Living  Prior Functioning/Environment              Frequency       Progress Toward Goals  OT Goals(current goals can now be found in the care plan section)  Progress towards OT goals: Progressing toward goals  Acute Rehab OT Goals Patient Stated Goal: To regain independence  Plan Discharge plan remains appropriate    Co-evaluation                 End of Session Equipment Utilized During Treatment: Gait belt   Activity Tolerance Patient tolerated treatment well   Patient Left in bed;with call bell/phone within reach;with bed alarm set   Nurse Communication          Time: [id number] OT Time Calculation (min): 34 min  Charges: OT General Charges $OT Visit: 1 Procedure OT Treatments $Self Care/Home Management : 23-37 mins  Bethany Villa OTR/L,CLT  04/17/2016, 1:06 PM

## 2016-04-17 NOTE — Progress Notes (Signed)
  Subjective:  Patient states that her pain is much improved today after I switched her to oxycodone from hydrocodone yesterday. Patient states that she is making progress with physical therapy. She states that she experienced pruritus in the left leg last night but this has responded to Benadryl. She has no other complaints at this time.  Objective:   VITALS:   Vitals:   04/16/16 1756 04/16/16 1900 04/17/16 0418 04/17/16 0724  BP: 131/77 120/68 107/65 114/79  Pulse: 63 84 (!) 108 (!) 114  Resp: 14 18 16 18   Temp: 98.5 F (36.9 C) 98.6 F (37 C) 98.5 F (36.9 C) 99.8 F (37.7 C)  TempSrc: Oral Oral Oral Oral  SpO2: 97% 98% 98% 97%  Weight:      Height:        PHYSICAL EXAM:  Left lower extremity: Patient's outer dressing was changed by me today. She had minimal drainage on her Aquasol dressing which was left in place. A new Ace wrap was placed over the left lower extremity. She had mild edema without significant erythema. She tenderness and a negative Homans sign. She palpable pedal pulses, intact sensation light touch and can flex and extend her toes and dorsiflex and plantarflex her ankle.   LABS  Results for orders placed or performed during the hospital encounter of 04/14/16 (from the past 24 hour(s))  CBC     Status: Abnormal   Collection Time: 04/17/16  4:10 AM  Result Value Ref Range   WBC 6.6 3.6 - 11.0 K/uL   RBC 4.27 3.80 - 5.20 MIL/uL   Hemoglobin 13.0 12.0 - 16.0 g/dL   HCT 16.139.1 09.635.0 - 04.547.0 %   MCV 91.7 80.0 - 100.0 fL   MCH 30.6 26.0 - 34.0 pg   MCHC 33.3 32.0 - 36.0 g/dL   RDW 40.914.0 81.111.5 - 91.414.5 %   Platelets 124 (L) 150 - 440 K/uL    No results found.  Assessment/Plan: 3 Days Post-Op   Active Problems:   Total knee replacement status  Patient continues to improve postoperatively. Continue with physical and occupational therapy. Patient needs to work on going up and downstairs. I anticipate discharge tomorrow.  Continue to monitor patient's itching.  Continue Benadryl. Continue Lovenox while an inpatient. White count today is 6.6 and hemoglobin and hematocrit are stable. Vital signs are stable.    Juanell FairlyKRASINSKI, Oluwadamilare Tobler , MD 04/17/2016, 2:27 PM

## 2016-04-18 MED ORDER — OXYCODONE HCL 5 MG PO TABS: 5.0000 mg | ORAL_TABLET | ORAL | 0 refills | Status: DC | PRN

## 2016-04-18 MED ORDER — ASPIRIN EC 325 MG PO TBEC: 325.0000 mg | DELAYED_RELEASE_TABLET | Freq: Two times a day (BID) | ORAL | 0 refills | Status: DC

## 2016-04-18 NOTE — Progress Notes (Signed)
Physical Therapy Treatment Patient Details Name: Bethany Villa MRN: [id number] DOB: 01/08/1956 Today's Date: 04/18/2016    History of Present Illness Pt was admitted to [facility] with a L TKA. Pt has Hx of COPD, anxiety, fibromyalgia, and depression.    PT Comments    Pt did well with PT, showed good confidence with ambulation and was able to negotiate up/down steps with much increased confidence, safety and efficiency.  She shows good effort w/o session and though she has some expected pain overall did very well.   Follow Up Recommendations  Home health PT     Equipment Recommendations       Recommendations for Other Services       Precautions / Restrictions Precautions Precautions: Fall Required Braces or Orthoses: Knee Immobilizer - Left Restrictions LLE Weight Bearing: Partial weight bearing    Mobility  Bed Mobility Overal bed mobility: Needs Assistance Bed Mobility: Sit to Supine     Supine to sit: Min assist     General bed mobility comments: Pt with light rail use, able get LEs to EOB w/o assist  Transfers Overall transfer level: Modified independent Equipment used: Rolling walker (2 wheeled) Transfers: Sit to/from Stand Sit to Stand: Supervision         General transfer comment: p is able to get to standing w/o direct assist  Ambulation/Gait Ambulation/Gait assistance: Supervision Ambulation Distance (Feet): 250 Feet Assistive device: Rolling walker (2 wheeled)       General Gait Details: Pt did well with ambulation and was unable to maintain consistent speed and appeared to maintain PWBing well.  She showed good confidence and has no LOBs or excessive fatigue.    Stairs Stairs: Yes Stairs assistance: Supervision Stair Management: One rail Left;Sideways Number of Stairs: 4 General stair comments: Pt did much better with steps today and felt very comfortable with sideways negotiation.    Wheelchair Mobility    Modified Rankin (Stroke Patients  Only)       Balance                                    Cognition Arousal/Alertness: Awake/alert Behavior During Therapy: WFL for tasks assessed/performed Overall Cognitive Status: Within Functional Limits for tasks assessed                      Exercises Total Joint Exercises Ankle Circles/Pumps: 15 reps;AROM Quad Sets: Strengthening;15 reps Gluteal Sets: Strengthening;15 reps Short Arc Quad: AROM;15 reps Heel Slides: 10 reps;Strengthening;AROM Hip ABduction/ADduction: AROM;10 reps Straight Leg Raises: PROM;AAROM;5 reps Goniometric ROM: 2-72    General Comments        Pertinent Vitals/Pain Pain Score: 7     Home Living                      Prior Function            PT Goals (current goals can now be found in the care plan section) Progress towards PT goals: Progressing toward goals    Frequency  BID    PT Plan Current plan remains appropriate    Co-evaluation             End of Session Equipment Utilized During Treatment: Gait belt Activity Tolerance: Patient tolerated treatment well Patient left: with chair alarm set;with call bell/phone within reach     Time: 0755-0820 PT Time Calculation (min) (ACUTE ONLY): 25  min  Charges:  $Gait Training: 8-22 mins $Therapeutic Exercise: 8-22 mins                    G Codes:      Malachi ProGalen R Kandi Brusseau, DPT 04/18/2016, 11:56 AM

## 2016-04-18 NOTE — Discharge Summary (Signed)
Physician Discharge Summary  Patient ID: Bethany Villa MRN: [id number] DOB/AGE: June 07, 1956 60 y.o.  Admit date: 04/14/2016 Discharge date: 04/18/2016  Admission Diagnoses:  M17.12 Unilateral primary osteoarthritis, left knee   Discharge Diagnoses:  M17.12 Unilateral primary osteoarthritis, left knee Active Problems:   S/p left Total knee replacement arthroplasty   Past Medical History:  Diagnosis Date  . Anxiety   . Asthma   . Chronic pain    lower back  . COPD (chronic obstructive pulmonary disease) (HCC)   . Depression   . DJD (degenerative joint disease)   . DJD (degenerative joint disease)   . Fibromyalgia   . GERD (gastroesophageal reflux disease)   . Headache   . Hyperlipidemia   . Lower extremity edema   . On home oxygen therapy   . Panic attack   . Shortness of breath dyspnea   . Sleep disorder     Surgeries: Procedure(s): TOTAL KNEE ARTHROPLASTY on 04/14/2016   Consultants (if any):   Discharged Condition: Improved  Hospital Course: Bethany BUMGARNER is an 60 y.o. female who was admitted 04/14/2016 with a diagnosis of  M17.12 Unilateral primary osteoarthritis, left knee and went to the operating room on 04/14/2016 and underwent an complicated Left total knee arthroplasty.  Patient is admitted to the orthopedic surgery service postoperatively. She was placed on 24 hours postop antibiotics. She was turned on Lovenox for DVT prophylaxis on postop day #1. Patient was seen and evaluated by physical and occupational therapy on postop day 1 and they continued to follow her throughout her hospital stay. Patient had daily labs drawn and her hemoglobin and hematocrit were stable throughout her hospitalization. Patient had issues with postoperative pain which was rectified by changing her oral pain medication from hydrocodone to oxycodone. The remainder of her hospital stay was uncomplicated. Given her clinical improvement she is prepared for discharge home on postoperative day  #4.  She was given perioperative antibiotics:  Anti-infectives    Start     Dose/Rate Route Frequency Ordered Stop   04/15/16 1400  clindamycin (CLEOCIN) capsule 150 mg     150 mg Oral Every 8 hours 04/15/16 1359 04/18/16 0741   04/14/16 1200  vancomycin (VANCOCIN) 1,250 mg in sodium chloride 0.9 % 250 mL IVPB     1,250 mg 166.7 mL/hr over 90 Minutes Intravenous Every 12 hours 04/14/16 1151 04/15/16 0047   04/14/16 0557  vancomycin (VANCOCIN) 1-5 GM/200ML-% IVPB    Comments:  Bethany, Villa: cabinet override      04/14/16 0557 04/14/16 1759   04/14/16 0224  vancomycin (VANCOCIN) IVPB 1000 mg/200 mL premix     1,000 mg 200 mL/hr over 60 Minutes Intravenous On call to O.R. 04/14/16 6962 04/14/16 0757    .  She was given sequential compression devices, early ambulation, and lovenox for DVT prophylaxis.  She benefited maximally from the hospital stay and there were no complications.    Recent vital signs:  Vitals:   04/18/16 0407 04/18/16 0746  BP:  122/75  Pulse: 100 (!) 103  Resp:  18  Temp:  97.7 F (36.5 C)    Recent laboratory studies:  Lab Results  Component Value Date   HGB 13.0 04/17/2016   HGB 14.0 04/16/2016   HGB 14.1 04/15/2016   Lab Results  Component Value Date   WBC 6.6 04/17/2016   PLT 124 (L) 04/17/2016   Lab Results  Component Value Date   INR 0.96 04/07/2016   Lab Results  Component Value  Date   NA 143 04/15/2016   K 4.6 04/15/2016   CL 107 04/15/2016   CO2 33 (H) 04/15/2016   BUN 10 04/15/2016   CREATININE 0.71 04/15/2016   GLUCOSE 88 04/15/2016    Discharge Medications:     Medication List    STOP taking these medications   aspirin 81 MG tablet Replaced by:  aspirin EC 325 MG tablet   diclofenac 75 MG EC tablet Commonly known as:  VOLTAREN   diclofenac sodium 1 % Gel Commonly known as:  VOLTAREN   traMADol 50 MG tablet Commonly known as:  ULTRAM     TAKE these medications   albuterol (2.5 MG/3ML) 0.083% nebulizer  solution Commonly known as:  PROVENTIL TAKE 3 ML BY NEBULIZATION Q 6 hrs prn FOR WHEEZING OR SOB Dx J44.9   VENTOLIN HFA 108 (90 Base) MCG/ACT inhaler Generic drug:  albuterol INHALE 2 PUFFS INTO THE LUNGS DAILY AS NEEDED FOR WHEEZING OR SHORTNESS OF BREATH.   aspirin EC 325 MG tablet Take 1 tablet (325 mg total) by mouth 2 (two) times daily. Replaces:  aspirin 81 MG tablet   atorvastatin 20 MG tablet Commonly known as:  LIPITOR TAKE 1 TABLET (20 MG TOTAL) BY MOUTH DAILY.   cyclobenzaprine 10 MG tablet Commonly known as:  FLEXERIL TAKE 1 TABLET (10 MG TOTAL) BY MOUTH 3 (THREE) TIMES DAILY AS NEEDED FOR MUSCLE SPASMS.   fluticasone 50 MCG/ACT nasal spray Commonly known as:  FLONASE Place 2 sprays into both nostrils daily.   furosemide 40 MG tablet Commonly known as:  LASIX Take 40 mg by mouth daily as needed for fluid. Reported on 09/23/2015   GOODY HEADACHE PO Take 1 packet by mouth daily as needed (headache).   omeprazole 20 MG capsule Commonly known as:  PRILOSEC TAKE 1 CAPSULE (20 MG TOTAL) BY MOUTH DAILY.   oxyCODONE 5 MG immediate release tablet Commonly known as:  Oxy IR/ROXICODONE Take 1-2 tablets (5-10 mg total) by mouth every 4 (four) hours as needed for moderate pain or severe pain.   topiramate 50 MG tablet Commonly known as:  TOPAMAX TAKE 1 TABLET EVERY DAY       Diagnostic Studies: Dg Chest 2 View  Result Date: 04/07/2016 CLINICAL DATA:  Preop for left knee surgery, history of asthma EXAM: CHEST  2 VIEW COMPARISON:  Chest x-ray of 09/08/2015 and CT chest of 09/04/2015 FINDINGS: The opacity noted previously within the left upper lobe has largely cleared with minimally prominent markings remaining in the left suprahilar region on current chest x-ray. The right lung is clear. No pleural effusion is seen. Mediastinal and hilar contours are unremarkable. The heart is within normal limits in size. No bony abnormality is seen. IMPRESSION: Almost complete clearing  of left upper lobe opacity noted previously with minimally prominent markings remaining in the left suprahilar region. Electronically Signed   By: Dwyane DeePaul  Barry M.D.   On: 04/07/2016 10:32   Dg Knee Left Port  Result Date: 04/14/2016 CLINICAL DATA:  Postop imaging following left knee arthroplasty. EXAM: PORTABLE LEFT KNEE - 1-2 VIEW COMPARISON:  10/17/2014 FINDINGS: New left knee prosthetic components are well-seated and well-aligned. There is no acute fracture or evidence of an operative complication. IMPRESSION: Well-positioned left knee prosthesis. Electronically Signed   By: Amie Portlandavid  Ormond M.D.   On: 04/14/2016 11:25    Disposition: 06-Home-Health Care Svc  Discharge Instructions    Call MD / Call 911    Complete by:  As directed  If you experience chest pain or shortness of breath, CALL 911 and be transported to the hospital emergency room.  If you develope a fever above 101 F, pus (white drainage) or increased drainage or redness at the wound, or calf pain, call your surgeon's office.   Constipation Prevention    Complete by:  As directed   Drink plenty of fluids.  Prune juice may be helpful.  You may use a stool softener, such as Colace (over the counter) 100 mg twice a day.  Use MiraLax (over the counter) for constipation as needed.   Diet general    Complete by:  As directed   Discharge instructions    Complete by:  As directed   Continue PWB on the left lower extremity 1 month postop. Continue to use TED stockings until follow-up. Patient may remove them at night for sleep. Elevate the left lower extremity whenever possible. Continue to use knee immobilizer at night or when lying in bed or when elevating the operative leg. The patient may remove the knee immobilizer to perform exercises or sit in a chair. Continue using the TENS unit and Polar Care for comfort. Keep incision clean and dry. Cover the left knee incision during showers with a plastic bag or Saran wrap. Take enteric coated  aspirin 325mg  by mouth twice a day for blood clot prevention. Continue to work on knee range of motion exercises at home as instructed by physical therapy. Continue to use a walker for assistance with ambulation until follow-up.  Patient will follow up with Dr. Deeann SaintHoward Tomczak in 1-2 weeks and should already have a followup appointment.  If not, please contact the office tomorrow to make a follow up appointment.   Driving restrictions    Complete by:  As directed   No driving until follow up with Dr. Hyacinth MeekerMiller in 1-2 weeks.   Increase activity slowly as tolerated    Complete by:  As directed   Lifting restrictions    Complete by:  As directed   No lifting for 12 weeks        Signed: Juanell FairlyKRASINSKI, Cresencia Asmus ,MD 04/18/2016, 3:07 PM

## 2016-04-18 NOTE — Progress Notes (Signed)
  Subjective:  POD#4 s/p left TKA.  Patient reports pain as mild to moderate.  Patient's pain is much better controlled on oxycodone. She was able to navigate stairs with physical therapy. I have reviewed the physical therapy progress now. Patient's hemoglobin and hematocrit remained stable. Patient has had a bowel movement. Pruritus is improved.  Objective:   VITALS:   Vitals:   04/17/16 1941 04/18/16 0346 04/18/16 0407 04/18/16 0746  BP: (!) 147/67 (!) 114/54  122/75  Pulse: (!) 104 (!) 35 100 (!) 103  Resp: 19 19  18   Temp: 98.7 F (37.1 C) 99.2 F (37.3 C)  97.7 F (36.5 C)  TempSrc: Oral Oral  Oral  SpO2: 99% 95%  97%  Weight:      Height:        PHYSICAL EXAM:   Lower extremity: Patient has intact sensation light touch about the left lower extremity.  Patient has intact motor function throughout the left lower extremity. She has palpable pedal pulses. Her dressing remained clean dry and intact. Knee is in an immobilizer. Polar Care is in place.   LABS  No results found for this or any previous visit (from the past 24 hour(s)).  No results found.  Assessment/Plan: 4 Days Post-Op   Active Problems:   Total knee replacement status  Patient is doing well postoperatively. She has progressive physical therapy and is ready for discharge home. She'll follow up with Dr. Hyacinth MeekerMiller in approximately 2 weeks in the office. She'll be discharged on oxycodone for pain. She will take enteric-coated aspirin 325 mg by mouth twice a day for DVT prophylaxis.    Juanell FairlyKRASINSKI, Dua Mehler , MD 04/18/2016, 2:55 PM

## 2016-04-18 NOTE — Care Management Note (Signed)
Case Management Note  Patient Details  Name: Basilio Cairoenny E Delmont MRN: [id number] Date of Birth: 02/18/1956  Subjective/Objective:   [facility] She[facility] to update her that Ms Hyacinth MeekerMiller is going home today and will need PT, Aide, and SW.  DME already ordered from [facility] by Serena Colonelick Berkhead, CM.                 Action/Plan:   Expected Discharge Date:                  Expected Discharge Plan:  Home w Home Health Services  In-House Referral:     Discharge planning Services  CM Consult  Post Acute Care Choice:    Choice offered to:  Patient  DME Arranged:    DME Agency[facility]  [facility]  HH Arranged:  PT, Nurse's Aide, Social Work [employer] [facility]  Status of Service:  In process, will continue to follow  If discussed at Long Length of Stay Meetings, dates discussed:    Additional Comments:  Dijon Kohlman A, RN 04/18/2016, 4:11 PM

## 2016-04-18 NOTE — Progress Notes (Addendum)
Patient is being discharged to home with home health. Patient will be staying with a friend while she recovers. Discharge and Rx instructions given and patient acknowledged understanding. Patient requested a Rx for Robaxin and I advised patient to contact Dr. Rondel BatonMiller's office tomorrow morning since Dr. Martha ClanKrasinski did not leave one.  She said that was not a problem and she would call Dr. Hyacinth MeekerMiller tomorrow. Hard copy Rx for oxycodone given to patient. IV's removed. Belongings packed.

## 2016-04-19 NOTE — Care Management (Signed)
Received call from patient who stated [facility]. I contacted Bethany Villa at [facility] who confirms patient and will reach out to her today.

## 2016-04-28 ENCOUNTER — Other Ambulatory Visit: Payer: Self-pay | Admitting: Nurse Practitioner

## 2016-04-28 DIAGNOSIS — E785 Hyperlipidemia, unspecified: Secondary | ICD-10-CM

## 2016-04-29 NOTE — Telephone Encounter (Signed)
Last refill without being seen 

## 2016-05-11 ENCOUNTER — Other Ambulatory Visit: Payer: Self-pay | Admitting: Nurse Practitioner

## 2016-07-29 ENCOUNTER — Other Ambulatory Visit: Payer: Self-pay | Admitting: Nurse Practitioner

## 2016-08-31 ENCOUNTER — Other Ambulatory Visit: Payer: Self-pay | Admitting: Nurse Practitioner

## 2016-09-01 ENCOUNTER — Emergency Department (HOSPITAL_COMMUNITY): Payer: Medicare Other

## 2016-09-01 ENCOUNTER — Other Ambulatory Visit: Payer: Self-pay

## 2016-09-01 ENCOUNTER — Telehealth: Payer: Self-pay | Admitting: Nurse Practitioner

## 2016-09-01 ENCOUNTER — Other Ambulatory Visit: Payer: Self-pay | Admitting: Nurse Practitioner

## 2016-09-01 ENCOUNTER — Encounter (HOSPITAL_COMMUNITY): Payer: Self-pay | Admitting: Emergency Medicine

## 2016-09-01 ENCOUNTER — Inpatient Hospital Stay (HOSPITAL_COMMUNITY)
Admission: EM | Admit: 2016-09-01 | Discharge: 2016-09-07 | DRG: 871 | Disposition: A | Discharge status: Home Health | Payer: Medicare Other | Attending: Internal Medicine | Admitting: Internal Medicine

## 2016-09-01 DIAGNOSIS — Z809 Family history of malignant neoplasm, unspecified: Secondary | ICD-10-CM

## 2016-09-01 DIAGNOSIS — R739 Hyperglycemia, unspecified: Secondary | ICD-10-CM | POA: Diagnosis present

## 2016-09-01 DIAGNOSIS — E872 Acidosis: Secondary | ICD-10-CM | POA: Diagnosis present

## 2016-09-01 DIAGNOSIS — Z8701 Personal history of pneumonia (recurrent): Secondary | ICD-10-CM

## 2016-09-01 DIAGNOSIS — Z9981 Dependence on supplemental oxygen: Secondary | ICD-10-CM

## 2016-09-01 DIAGNOSIS — J440 Chronic obstructive pulmonary disease with acute lower respiratory infection: Secondary | ICD-10-CM | POA: Diagnosis present

## 2016-09-01 DIAGNOSIS — M797 Fibromyalgia: Secondary | ICD-10-CM | POA: Diagnosis present

## 2016-09-01 DIAGNOSIS — Z96652 Presence of left artificial knee joint: Secondary | ICD-10-CM | POA: Diagnosis present

## 2016-09-01 DIAGNOSIS — I5033 Acute on chronic diastolic (congestive) heart failure: Secondary | ICD-10-CM | POA: Diagnosis present

## 2016-09-01 DIAGNOSIS — Z79899 Other long term (current) drug therapy: Secondary | ICD-10-CM | POA: Diagnosis not present

## 2016-09-01 DIAGNOSIS — J9602 Acute respiratory failure with hypercapnia: Secondary | ICD-10-CM | POA: Diagnosis present

## 2016-09-01 DIAGNOSIS — J9601 Acute respiratory failure with hypoxia: Secondary | ICD-10-CM | POA: Diagnosis present

## 2016-09-01 DIAGNOSIS — E785 Hyperlipidemia, unspecified: Secondary | ICD-10-CM | POA: Diagnosis present

## 2016-09-01 DIAGNOSIS — Z09 Encounter for follow-up examination after completed treatment for conditions other than malignant neoplasm: Secondary | ICD-10-CM

## 2016-09-01 DIAGNOSIS — Z23 Encounter for immunization: Secondary | ICD-10-CM

## 2016-09-01 DIAGNOSIS — Z7982 Long term (current) use of aspirin: Secondary | ICD-10-CM | POA: Diagnosis not present

## 2016-09-01 DIAGNOSIS — J441 Chronic obstructive pulmonary disease with (acute) exacerbation: Secondary | ICD-10-CM | POA: Diagnosis present

## 2016-09-01 DIAGNOSIS — F17210 Nicotine dependence, cigarettes, uncomplicated: Secondary | ICD-10-CM | POA: Diagnosis present

## 2016-09-01 DIAGNOSIS — J189 Pneumonia, unspecified organism: Secondary | ICD-10-CM | POA: Diagnosis present

## 2016-09-01 DIAGNOSIS — R7989 Other specified abnormal findings of blood chemistry: Secondary | ICD-10-CM | POA: Diagnosis not present

## 2016-09-01 DIAGNOSIS — K219 Gastro-esophageal reflux disease without esophagitis: Secondary | ICD-10-CM | POA: Diagnosis present

## 2016-09-01 DIAGNOSIS — M6281 Muscle weakness (generalized): Secondary | ICD-10-CM

## 2016-09-01 DIAGNOSIS — A419 Sepsis, unspecified organism: Secondary | ICD-10-CM | POA: Diagnosis present

## 2016-09-01 DIAGNOSIS — R0603 Acute respiratory distress: Secondary | ICD-10-CM

## 2016-09-01 DIAGNOSIS — I509 Heart failure, unspecified: Secondary | ICD-10-CM | POA: Diagnosis not present

## 2016-09-01 DIAGNOSIS — R945 Abnormal results of liver function studies: Secondary | ICD-10-CM

## 2016-09-01 DIAGNOSIS — R0600 Dyspnea, unspecified: Secondary | ICD-10-CM | POA: Diagnosis present

## 2016-09-01 LAB — MRSA PCR SCREENING: MRSA BY PCR: NEGATIVE

## 2016-09-01 LAB — URINALYSIS, ROUTINE W REFLEX MICROSCOPIC
BILIRUBIN URINE: NEGATIVE
Glucose, UA: NEGATIVE mg/dL
HGB URINE DIPSTICK: NEGATIVE
KETONES UR: NEGATIVE mg/dL
LEUKOCYTES UA: NEGATIVE
Nitrite: NEGATIVE
PH: 6 (ref 5.0–8.0)
Protein, ur: >=300 mg/dL — AB
SPECIFIC GRAVITY, URINE: 1.026 (ref 1.005–1.030)

## 2016-09-01 LAB — COMPREHENSIVE METABOLIC PANEL
ALK PHOS: 112 U/L (ref 38–126)
ALT: 69 U/L — AB (ref 14–54)
ANION GAP: 9 (ref 5–15)
AST: 53 U/L — ABNORMAL HIGH (ref 15–41)
Albumin: 3.6 g/dL (ref 3.5–5.0)
BILIRUBIN TOTAL: 0.8 mg/dL (ref 0.3–1.2)
BUN: 9 mg/dL (ref 6–20)
CALCIUM: 8.6 mg/dL — AB (ref 8.9–10.3)
CO2: 28 mmol/L (ref 22–32)
CREATININE: 0.61 mg/dL (ref 0.44–1.00)
Chloride: 99 mmol/L — ABNORMAL LOW (ref 101–111)
Glucose, Bld: 128 mg/dL — ABNORMAL HIGH (ref 65–99)
Potassium: 3.6 mmol/L (ref 3.5–5.1)
Sodium: 136 mmol/L (ref 135–145)
TOTAL PROTEIN: 7.7 g/dL (ref 6.5–8.1)

## 2016-09-01 LAB — CBC WITH DIFFERENTIAL/PLATELET
BASOS PCT: 0 %
Basophils Absolute: 0 10*3/uL (ref 0.0–0.1)
EOS ABS: 0 10*3/uL (ref 0.0–0.7)
EOS PCT: 0 %
HCT: 52.6 % — ABNORMAL HIGH (ref 36.0–46.0)
HEMOGLOBIN: 16.4 g/dL — AB (ref 12.0–15.0)
LYMPHS PCT: 31 %
Lymphs Abs: 4 10*3/uL (ref 0.7–4.0)
MCH: 28.8 pg (ref 26.0–34.0)
MCHC: 31.2 g/dL (ref 30.0–36.0)
MCV: 92.4 fL (ref 78.0–100.0)
Monocytes Absolute: 1.4 10*3/uL — ABNORMAL HIGH (ref 0.1–1.0)
Monocytes Relative: 11 %
NEUTROS ABS: 7.5 10*3/uL (ref 1.7–7.7)
NEUTROS PCT: 58 %
Platelets: 153 10*3/uL (ref 150–400)
RBC: 5.69 MIL/uL — ABNORMAL HIGH (ref 3.87–5.11)
RDW: 14.1 % (ref 11.5–15.5)
WBC: 12.9 10*3/uL — ABNORMAL HIGH (ref 4.0–10.5)

## 2016-09-01 LAB — BLOOD GAS, ARTERIAL
ACID-BASE EXCESS: 3 mmol/L — AB (ref 0.0–2.0)
Bicarbonate: 24.9 mmol/L (ref 20.0–28.0)
Delivery systems: POSITIVE
Drawn by: 234301
EXPIRATORY PAP: 8
FIO2: 50
INSPIRATORY PAP: 18
MODE: POSITIVE
O2 SAT: 90.6 %
PCO2 ART: 62.8 mmHg — AB (ref 32.0–48.0)
pH, Arterial: 7.287 — ABNORMAL LOW (ref 7.350–7.450)
pO2, Arterial: 69.5 mmHg — ABNORMAL LOW (ref 83.0–108.0)

## 2016-09-01 LAB — INFLUENZA PANEL BY PCR (TYPE A & B)
INFLBPCR: NEGATIVE
Influenza A By PCR: NEGATIVE

## 2016-09-01 LAB — APTT: APTT: 28 s (ref 24–36)

## 2016-09-01 LAB — PROTIME-INR
INR: 1.01
Prothrombin Time: 13.3 seconds (ref 11.4–15.2)

## 2016-09-01 LAB — PROCALCITONIN

## 2016-09-01 LAB — BRAIN NATRIURETIC PEPTIDE: B NATRIURETIC PEPTIDE 5: 2086 pg/mL — AB (ref 0.0–100.0)

## 2016-09-01 LAB — LACTIC ACID, PLASMA
Lactic Acid, Venous: 2.5 mmol/L (ref 0.5–1.9)
Lactic Acid, Venous: 2.3 mmol/L (ref 0.5–1.9)

## 2016-09-01 LAB — TROPONIN I

## 2016-09-01 LAB — I-STAT CG4 LACTIC ACID, ED: Lactic Acid, Venous: 1.31 mmol/L (ref 0.5–1.9)

## 2016-09-01 MED ORDER — GUAIFENESIN ER 600 MG PO TB12
600.0000 mg | ORAL_TABLET | Freq: Two times a day (BID) | ORAL | Status: DC
  Administered 2016-09-01 – 2016-09-04 (×6): 600 mg via ORAL
  Filled 2016-09-01 (×6): qty 1

## 2016-09-01 MED ORDER — SODIUM CHLORIDE 0.9 % IV BOLUS (SEPSIS)
1000.0000 mL | Freq: Once | INTRAVENOUS | Status: AC
Start: 2016-09-01 — End: 2016-09-01
  Administered 2016-09-01: 1000 mL via INTRAVENOUS

## 2016-09-01 MED ORDER — SODIUM CHLORIDE 0.9 % IV BOLUS (SEPSIS): 1000.0000 mL | Freq: Once | INTRAVENOUS | Status: DC

## 2016-09-01 MED ORDER — PANTOPRAZOLE SODIUM 40 MG PO TBEC
40.0000 mg | DELAYED_RELEASE_TABLET | Freq: Every day | ORAL | Status: DC
  Administered 2016-09-01 – 2016-09-07 (×7): 40 mg via ORAL
  Filled 2016-09-01 (×7): qty 1

## 2016-09-01 MED ORDER — INFLUENZA VAC SPLIT QUAD 0.5 ML IM SUSY
0.5000 mL | PREFILLED_SYRINGE | INTRAMUSCULAR | Status: AC
  Administered 2016-09-02: 0.5 mL via INTRAMUSCULAR
  Filled 2016-09-01: qty 0.5

## 2016-09-01 MED ORDER — ENOXAPARIN SODIUM 40 MG/0.4ML ~~LOC~~ SOLN
40.0000 mg | SUBCUTANEOUS | Status: DC
  Administered 2016-09-01 – 2016-09-06 (×6): 40 mg via SUBCUTANEOUS
  Filled 2016-09-01 (×6): qty 0.4

## 2016-09-01 MED ORDER — ALBUTEROL SULFATE HFA 108 (90 BASE) MCG/ACT IN AERS: INHALATION_SPRAY | RESPIRATORY_TRACT | 0 refills | Status: DC

## 2016-09-01 MED ORDER — VANCOMYCIN HCL IN DEXTROSE 1-5 GM/200ML-% IV SOLN
1000.0000 mg | Freq: Two times a day (BID) | INTRAVENOUS | Status: DC
  Administered 2016-09-02: 1000 mg via INTRAVENOUS
  Filled 2016-09-01: qty 200

## 2016-09-01 MED ORDER — TOPIRAMATE 25 MG PO TABS
50.0000 mg | ORAL_TABLET | Freq: Every day | ORAL | Status: DC
  Administered 2016-09-01 – 2016-09-07 (×7): 50 mg via ORAL
  Filled 2016-09-01 (×10): qty 2

## 2016-09-01 MED ORDER — LEVOFLOXACIN IN D5W 750 MG/150ML IV SOLN
750.0000 mg | Freq: Once | INTRAVENOUS | Status: AC
  Administered 2016-09-01: 750 mg via INTRAVENOUS
  Filled 2016-09-01: qty 150

## 2016-09-01 MED ORDER — NICOTINE 21 MG/24HR TD PT24
21.0000 mg | MEDICATED_PATCH | Freq: Every day | TRANSDERMAL | Status: DC
  Administered 2016-09-02 – 2016-09-07 (×6): 21 mg via TRANSDERMAL
  Filled 2016-09-01 (×6): qty 1

## 2016-09-01 MED ORDER — IPRATROPIUM-ALBUTEROL 0.5-2.5 (3) MG/3ML IN SOLN
3.0000 mL | RESPIRATORY_TRACT | Status: DC
  Administered 2016-09-01 – 2016-09-05 (×23): 3 mL via RESPIRATORY_TRACT
  Filled 2016-09-01 (×25): qty 3

## 2016-09-01 MED ORDER — FLUTICASONE PROPIONATE 50 MCG/ACT NA SUSP
2.0000 | Freq: Every day | NASAL | Status: DC
  Administered 2016-09-02 – 2016-09-07 (×6): 2 via NASAL
  Filled 2016-09-01: qty 16

## 2016-09-01 MED ORDER — ALBUTEROL SULFATE (2.5 MG/3ML) 0.083% IN NEBU: INHALATION_SOLUTION | RESPIRATORY_TRACT | 3 refills | Status: DC

## 2016-09-01 MED ORDER — METHOCARBAMOL 500 MG PO TABS
500.0000 mg | ORAL_TABLET | Freq: Two times a day (BID) | ORAL | Status: DC
  Administered 2016-09-01 – 2016-09-07 (×12): 500 mg via ORAL
  Filled 2016-09-01 (×12): qty 1

## 2016-09-01 MED ORDER — CHLORHEXIDINE GLUCONATE 0.12 % MT SOLN
15.0000 mL | Freq: Two times a day (BID) | OROMUCOSAL | Status: DC
  Administered 2016-09-01 – 2016-09-05 (×9): 15 mL via OROMUCOSAL
  Filled 2016-09-01 (×10): qty 15

## 2016-09-01 MED ORDER — TOPIRAMATE 25 MG PO TABS
ORAL_TABLET | ORAL | Status: AC
  Filled 2016-09-01: qty 2

## 2016-09-01 MED ORDER — ALBUTEROL SULFATE (2.5 MG/3ML) 0.083% IN NEBU
2.5000 mg | INHALATION_SOLUTION | RESPIRATORY_TRACT | Status: DC | PRN
  Administered 2016-09-01: 2.5 mg via RESPIRATORY_TRACT
  Filled 2016-09-01: qty 3

## 2016-09-01 MED ORDER — ACETAMINOPHEN 325 MG PO TABS
650.0000 mg | ORAL_TABLET | Freq: Once | ORAL | Status: AC
  Administered 2016-09-01: 650 mg via ORAL
  Filled 2016-09-01: qty 2

## 2016-09-01 MED ORDER — ORAL CARE MOUTH RINSE
15.0000 mL | Freq: Two times a day (BID) | OROMUCOSAL | Status: DC
  Administered 2016-09-02 – 2016-09-04 (×6): 15 mL via OROMUCOSAL

## 2016-09-01 MED ORDER — VANCOMYCIN HCL IN DEXTROSE 1-5 GM/200ML-% IV SOLN: 1000.0000 mg | Freq: Once | INTRAVENOUS | Status: DC

## 2016-09-01 MED ORDER — VANCOMYCIN HCL 10 G IV SOLR
1500.0000 mg | Freq: Once | INTRAVENOUS | Status: AC
  Administered 2016-09-01: 1500 mg via INTRAVENOUS
  Filled 2016-09-01: qty 1500

## 2016-09-01 MED ORDER — ATORVASTATIN CALCIUM 20 MG PO TABS
20.0000 mg | ORAL_TABLET | Freq: Every day | ORAL | Status: DC
  Administered 2016-09-01 – 2016-09-06 (×6): 20 mg via ORAL
  Filled 2016-09-01 (×6): qty 1

## 2016-09-01 MED ORDER — ALBUTEROL (5 MG/ML) CONTINUOUS INHALATION SOLN
10.0000 mg/h | INHALATION_SOLUTION | RESPIRATORY_TRACT | Status: DC
  Administered 2016-09-01: 10 mg/h via RESPIRATORY_TRACT
  Filled 2016-09-01: qty 20

## 2016-09-01 MED ORDER — ASPIRIN EC 325 MG PO TBEC
325.0000 mg | DELAYED_RELEASE_TABLET | Freq: Two times a day (BID) | ORAL | Status: DC
  Administered 2016-09-01 – 2016-09-07 (×12): 325 mg via ORAL
  Filled 2016-09-01 (×12): qty 1

## 2016-09-01 MED ORDER — LACTATED RINGERS IV SOLN
INTRAVENOUS | Status: DC
  Administered 2016-09-01: 22:00:00 via INTRAVENOUS

## 2016-09-01 MED ORDER — LEVOFLOXACIN IN D5W 750 MG/150ML IV SOLN
750.0000 mg | INTRAVENOUS | Status: DC
  Administered 2016-09-02 – 2016-09-03 (×2): 750 mg via INTRAVENOUS
  Filled 2016-09-01 (×2): qty 150

## 2016-09-01 MED ORDER — TRAMADOL HCL 50 MG PO TABS
50.0000 mg | ORAL_TABLET | Freq: Four times a day (QID) | ORAL | Status: DC | PRN
  Administered 2016-09-03: 50 mg via ORAL
  Filled 2016-09-01: qty 1

## 2016-09-01 MED ORDER — OXYCODONE HCL 5 MG PO TABS
5.0000 mg | ORAL_TABLET | ORAL | Status: DC | PRN
  Administered 2016-09-02 – 2016-09-04 (×7): 5 mg via ORAL
  Administered 2016-09-04 – 2016-09-07 (×9): 10 mg via ORAL
  Filled 2016-09-01 (×3): qty 2
  Filled 2016-09-01: qty 1
  Filled 2016-09-01 (×3): qty 2
  Filled 2016-09-01 (×2): qty 1
  Filled 2016-09-01 (×3): qty 2
  Filled 2016-09-01 (×4): qty 1

## 2016-09-01 NOTE — Progress Notes (Addendum)
Pharmacy Antibiotic Note  Bethany Villa is a 61 y.o. female admitted on 09/01/2016 with pneumonia.  Pharmacy has been consulted for Vancomycin and Levaquin dosing.  Plan: *Vancomycin 1500mg  loading dose, then 1000mg  IV every 12 hours.  Goal trough 15-20 mcg/mL. *Levaquin 750mg  IV q24hours *F/U cultures and clinical progress *Monitor V/S and labs *Levels as indicated   Temp (24hrs), Avg:100.8 F (38.2 C), Min:100.8 F (38.2 C), Max:100.8 F (38.2 C)   Recent Labs Lab 09/01/16 1544  LATICACIDVEN 1.31    CrCl cannot be calculated (Patient's most recent lab result is older than the maximum 21 days allowed.).    Allergies  Allergen Reactions  . Gabapentin Shortness Of Breath and Rash  . Mobic [Meloxicam] Anaphylaxis  . Penicillins Shortness Of Breath and Swelling    Has patient had a PCN reaction causing immediate rash, facial/tongue/throat swelling, SOB or lightheadedness with hypotension: unknown Has patient had a PCN reaction causing severe rash involving mucus membranes or skin necrosis: {unknown Has patient had a PCN reaction that required hospitalization {unknown Has patient had a PCN reaction occurring within the last 10 years: no If all of the above answers are "NO", then may proceed with Cephalosporin use.  . Sulfa Antibiotics Shortness Of Breath and Swelling    Antimicrobials this admission: Vancomycin 1/3 >>  Zosyn 1/3 >>   Dose adjustments this admission: n/a  Microbiology results: 1/3 BCx: pending 1/3 UCx: pending  Thank you for allowing pharmacy to be a part of this patient's care.  Elder CyphersLorie Lynise Porr, BS Loura Backharm D, [place] Clinical Pharmacist Pager [phone] 09/01/2016 4:03 PM

## 2016-09-01 NOTE — ED Notes (Signed)
CRITICAL VALUE ALERT  Critical value received:  pco2 62.8  Date of notification:  09/01/16  Time of notification:  1610  Critical value read back:Yes.    Nurse who received alert:  c Mychaela Lennartz:  Responding MD:  Dr Ranae Palmsyelverton  Time MD responded:  [phone]

## 2016-09-01 NOTE — ED Notes (Signed)
Pt fluids down to 51500ml/hr

## 2016-09-01 NOTE — H&P (Signed)
History and Physical    Bethany Villa:[id number] DOB: 11/04/1955 DOA: 09/01/2016  PCP: Bennie Pierini, FNP Consultants:  Hyacinth Meeker - orthopedics Patient coming from: home - lives alone; NOK: son  Chief Complaint: respiratory failure  HPI: Bethany Villa is a 61 y.o. female with medical history significant of COPD previously on home O2 with h/o intubations.  She reports that she started feeling bad on Sunday.  Her inhaler ran out.  Had been using more than usual for a week.  Subjective fever.  +productive cough - change in purulence and volume.  +SOB.  Called EMS and put her on BIPAP.  History otherwise limited due to acute respiratory distress and use of BIPAP.  ED Course: Per Dr. Ranae Palms: Patient was placed on BiPAP and given continuous albuterol nebulized treatment. Questionable bilateral pneumonia on chest x-ray. Meets sepsis criteria and IV antibiotics and IV fluids given. Patient appears much more comfortable on BiPAP. Maintain saturations in the high 90s.   Review of Systems: As per HPI; otherwise 10 point review of systems reviewed and negative.   Ambulatory Status:  ambulates without assistance  Past Medical History:  Diagnosis Date  . Anxiety   . Asthma   . Chronic pain    lower back  . COPD (chronic obstructive pulmonary disease) (HCC)   . Depression   . DJD (degenerative joint disease)   . Fibromyalgia   . GERD (gastroesophageal reflux disease)   . Headache   . Hyperlipidemia   . Lower extremity edema   . On home oxygen therapy    has not been on since 2016  . Panic attack   . Shortness of breath dyspnea   . Sleep disorder     Past Surgical History:  Procedure Laterality Date  . ABDOMINAL SURGERY    . ANKLE SURGERY    . BACK SURGERY     lower  . CARPAL TUNNEL RELEASE    . CESAREAN SECTION    . KNEE SURGERY    . TONSILLECTOMY    . TOTAL KNEE ARTHROPLASTY Left 04/14/2016   Procedure: TOTAL KNEE ARTHROPLASTY;  Surgeon: Deeann Saint, MD;  Location:  [facility] ORS;  Service: Orthopedics;  Laterality: Left;  . TUBAL LIGATION      Social History   Social History  . Marital status: Divorced    Spouse name: N/A  . Number of children: N/A  . Years of education: N/A   Occupational History  . disabled    Social History Main Topics  . Smoking status: Current Every Day Smoker    Packs/day: 1.00    Years: 45.00    Types: Cigarettes  . Smokeless tobacco: Never Used  . Alcohol use 0.0 oz/week     Comment: rare  . Drug use: No  . Sexual activity: Yes    Birth control/ protection: Surgical   Other Topics Concern  . Not on file   Social History Narrative  . No narrative on file    Allergies  Allergen Reactions  . Gabapentin Shortness Of Breath and Rash  . Mobic [Meloxicam] Anaphylaxis  . Penicillins Shortness Of Breath and Swelling    Has patient had a PCN reaction causing immediate rash, facial/tongue/throat swelling, SOB or lightheadedness with hypotension: unknown Has patient had a PCN reaction causing severe rash involving mucus membranes or skin necrosis: {unknown Has patient had a PCN reaction that required hospitalization {unknown Has patient had a PCN reaction occurring within the last 10 years: no If all of the above  answers are "NO", then may proceed with Cephalosporin use.  . Sulfa Antibiotics Shortness Of Breath and Swelling    Family History  Problem Relation Age of Onset  . Cancer Mother     beast    Prior to Admission medications   Medication Sig Start Date End Date Taking? Authorizing Provider  albuterol (PROAIR HFA) 108 (90 Base) MCG/ACT inhaler INHALE 2 PUFFS INTO THE LUNGS EVERY SIX HOURS AS NEEDED FOR WHEEZING OR SHORTNESS OF BREATH 09/01/16  Yes [name], FNP  albuterol (PROVENTIL) (2.5 MG/3ML) 0.083% nebulizer solution TAKE 3 ML BY NEBULIZATION Q 6 hrs prn FOR WHEEZING OR SOB Dx J44.9 09/01/16   [name], FNP  aspirin EC 325 MG tablet Take 1 tablet (325 mg total) by mouth 2 (two)  times daily. 04/18/16   Juanell FairlyKevin Krasinski, MD  Aspirin-Acetaminophen-Caffeine (GOODY HEADACHE PO) Take 1 packet by mouth daily as needed (headache).    Historical Provider, MD  atorvastatin (LIPITOR) 20 MG tablet TAKE 1 TABLET (20 MG TOTAL) BY MOUTH DAILY. 04/29/16   [name], FNP  cyclobenzaprine (FLEXERIL) 10 MG tablet TAKE 1 TABLET (10 MG TOTAL) BY MOUTH 3 (THREE) TIMES DAILY AS NEEDED FOR MUSCLE SPASMS. 03/04/16   [name], FNP  fluticasone (FLONASE) 50 MCG/ACT nasal spray Place 2 sprays into both nostrils daily. 11/19/14   [name], FNP  furosemide (LASIX) 40 MG tablet Take 40 mg by mouth daily as needed for fluid. Reported on 09/23/2015 06/27/13   Historical Provider, MD  methocarbamol (ROBAXIN) 500 MG tablet Take 500 mg by mouth 2 (two) times daily. 08/28/16   Historical Provider, MD  omeprazole (PRILOSEC) 20 MG capsule TAKE 1 CAPSULE (20 MG TOTAL) BY MOUTH DAILY. 07/28/15   [name], FNP  oxyCODONE (OXY IR/ROXICODONE) 5 MG immediate release tablet Take 1-2 tablets (5-10 mg total) by mouth every 4 (four) hours as needed for moderate pain or severe pain. 04/18/16   Juanell FairlyKevin Krasinski, MD  topiramate (TOPAMAX) 50 MG tablet TAKE 1 TABLET EVERY DAY 03/29/16   [name], FNP  traMADol (ULTRAM) 50 MG tablet TAKE 1 TABLET BY MOUTH EVERY 4 TO 6 HOURS AS NEEDED 08/10/16   Historical Provider, MD  VENTOLIN HFA 108 (90 Base) MCG/ACT inhaler INHALE 2 PUFFS INTO THE LUNGS DAILY AS NEEDED FOR WHEEZING OR SHORTNESS OF BREATH. 08/02/16   [name], FNP    Physical Exam: Vitals:   09/01/16 1822 09/01/16 1830 09/01/16 2009 09/01/16 2100  BP:  107/77 (!) 115/102 127/88  Pulse:  117 (!) 109 (!) 102  Resp:  (!) 27  (!) 31  Temp:   98.3 F (36.8 C)   TempSrc:      SpO2: 100% 97% 94% 93%  Weight:      Height:         General: Acute respiratory distress despite BIPAP - tachypnea, difficulty completing a sentence Eyes:  PERRL, EOMI, normal lids,  iris ENT:  grossly normal hearing, lips & tongue, mmm Neck:  no LAD, masses or thyromegaly Cardiovascular:  Tachycardia, no m/r/g. No LE edema.  Respiratory:Poor air movement.  Tachypnea.  Diffuse wheezes. Abdomen:  soft, ntnd, NABS Skin:  no rash or induration seen on limited exam Musculoskeletal:  grossly normal tone BUE/BLE, good ROM, no bony abnormality Psychiatric:  grossly normal mood and affect, speech fluent and appropriate, AOx3 Neurologic:  CN 2-12 grossly intact, moves all extremities in coordinated fashion, sensation intact  Labs on Admission: I have personally reviewed following labs and imaging studies  CBC:  Recent Labs Lab 09/01/16 1536  WBC 12.9*  NEUTROABS 7.5  HGB 16.4*  HCT 52.6*  MCV 92.4  PLT 153   Basic Metabolic Panel:  Recent Labs Lab 09/01/16 1536  NA 136  K 3.6  CL 99*  CO2 28  GLUCOSE 128*  BUN 9  CREATININE 0.61  CALCIUM 8.6*   GFR: Estimated Creatinine Clearance: 78.2 mL/min (by C-G formula based on SCr of 0.61 mg/dL). Liver Function Tests:  Recent Labs Lab 09/01/16 1536  AST 53*  ALT 69*  ALKPHOS 112  BILITOT 0.8  PROT 7.7  ALBUMIN 3.6   No results for input(s): LIPASE, AMYLASE in the last 168 hours. No results for input(s): AMMONIA in the last 168 hours. Coagulation Profile:  Recent Labs Lab 09/01/16 2029  INR 1.01   Cardiac Enzymes:  Recent Labs Lab 09/01/16 1536  TROPONINI <0.03   BNP (last 3 results) No results for input(s): PROBNP in the last 8760 hours. HbA1C: No results for input(s): HGBA1C in the last 72 hours. CBG: No results for input(s): GLUCAP in the last 168 hours. Lipid Profile: No results for input(s): CHOL, HDL, LDLCALC, TRIG, CHOLHDL, LDLDIRECT in the last 72 hours. Thyroid Function Tests: No results for input(s): TSH, T4TOTAL, FREET4, T3FREE, THYROIDAB in the last 72 hours. Anemia Panel: No results for input(s): VITAMINB12, FOLATE, FERRITIN, TIBC, IRON, RETICCTPCT in the last 72  hours. Urine analysis:    Component Value Date/Time   COLORURINE YELLOW (A) 04/07/2016 0849   APPEARANCEUR CLEAR (A) 04/07/2016 0849   APPEARANCEUR Hazy 08/22/2013 1111   LABSPEC 1.017 04/07/2016 0849   LABSPEC 1.016 08/22/2013 1111   PHURINE 6.0 04/07/2016 0849   GLUCOSEU NEGATIVE 04/07/2016 0849   GLUCOSEU Negative 08/22/2013 1111   HGBUR NEGATIVE 04/07/2016 0849   BILIRUBINUR NEGATIVE 04/07/2016 0849   BILIRUBINUR Negative 08/22/2013 1111   KETONESUR NEGATIVE 04/07/2016 0849   PROTEINUR NEGATIVE 04/07/2016 0849   UROBILINOGEN 0.2 07/24/2014 0635   NITRITE NEGATIVE 04/07/2016 0849   LEUKOCYTESUR NEGATIVE 04/07/2016 0849   LEUKOCYTESUR 1+ 08/22/2013 1111    Creatinine Clearance: Estimated Creatinine Clearance: 78.2 mL/min (by C-G formula based on SCr of 0.61 mg/dL).  Sepsis Labs: @LABRCNTIP (procalcitonin:4,lacticidven:4) ) Recent Results (from the past 240 hour(s))  Culture, blood (Routine X 2) w Reflex to ID Panel     Status: None (Preliminary result)   Collection Time: 09/01/16  3:37 PM  Result Value Ref Range Status   Specimen Description BLOOD RIGHT FOREARM  Final   Special Requests BOTTLES DRAWN AEROBIC AND ANAEROBIC 6CC  Final   Culture PENDING  Incomplete   Report Status PENDING  Incomplete  Culture, blood (Routine X 2) w Reflex to ID Panel     Status: None (Preliminary result)   Collection Time: 09/01/16  3:43 PM  Result Value Ref Range Status   Specimen Description LEFT ANTECUBITAL  Final   Special Requests BOTTLES DRAWN AEROBIC AND ANAEROBIC [facility]  Final   Culture PENDING  Incomplete   Report Status PENDING  Incomplete     Radiological Exams on Admission: Dg Chest Port 1 View  Result Date: 09/01/2016 CLINICAL DATA:  The days of shortness of breath. History of COPD. Current smoker. EXAM: PORTABLE CHEST 1 VIEW COMPARISON:  PA and lateral chest x-ray of April 07, 2016 FINDINGS: The lungs are well-expanded. The interstitial markings are increased diffusely. The  heart is top-normal in size. The pulmonary vascularity is mildly prominent centrally. There is calcification in the wall of the aortic arch. There is no  pleural effusion. The bony thorax exhibits no acute abnormality. IMPRESSION: Bilaterally increased interstitial infiltrates likely reflecting pneumonia. CHF superimposed upon COPD could produce similar findings. Correlation with patient's clinical and laboratory values will be needed. Follow-up radiographs following therapy would be useful to assure return to baseline. Electronically Signed   By: David  [place] M.D.   On: 09/01/2016 15:53    EKG: Independently reviewed.  Sinus tachycardia with rate 139; nonspecific ST changes, difficult to decipher due to rate, will need repeat once more stable from a respiratory standpoint, but no apparent STEMI  Assessment/Plan Principal Problem:   Acute respiratory failure with hypoxia and hypercapnia (HCC) Active Problems:   Hyperglycemia   CAP (community acquired pneumonia)   Sepsis (HCC)   COPD exacerbation (HCC)   Elevated brain natriuretic peptide (BNP) level   LFT elevation   Acute respiratory failure resulting from COPD/CAP and causing sepsis -Elevated WBC count (12.9), tachycardia, tachypnea with elevated lactate to 2.5  -Sepsis protocol initiated; has not yet received all of IVF bolus due to cxoncern for elevated BNP -Blood and urine cultures pending -Given productive cough, respiratory failure, and bilateral interstitial infiltrates superimposed on COPD on chest x-ray , most likely community-acquired pneumonia complicated by severe COPD. -Will start Levaquin and Vancomycin for ICU PNA -LR @ 75cc/hr - with careful attention for volume overload.  While the BNP is markedly elevated and the radiology reading suggested the possibility of CHF, she had a normal Echo in 1/17 and her current symptoms are much more consistent with an infectious process. - Fever control - Repeat CBC in am - Sputum  cultures - albuterol PRN - Duonebs q6h - mucinex -Will admit to SDU with telemetry and continue to monitor -Will trend lactate to ensure improvement -Mild AST/ALT elevation is likely due to acute illness/shock liver and is expected to improve; will recheck in AM -Hyperglycemia is likely acute stress response, will follow with fasting AM labs  Tobacco dependence -Encourage cessation.  This was discussed with the patient and should be reviewed on an ongoing basis.   -Patch ordered.  DVT prophylaxis: Lovenox  Code Status: Full - confirmed with patient Family Communication: None present Disposition Plan:  Home once clinically improved Consults called: CM, nutrition, pulmonology, RT, SW Admission status: Admit - It is my clinical opinion that admission to INPATIENT is reasonable and necessary because this patient will require at least 2 midnights in the hospital to treat this condition based on the medical complexity of the problems presented.  Given the aforementioned information, the predictability of an adverse outcome is felt to be significant.    Jonah Blue MD [facility]  If 7PM-7AM, please contact night-coverage [email or web address] Password [facility]  09/01/2016, 9:53 PM

## 2016-09-01 NOTE — ED Notes (Signed)
clarifed with EDP if pt still needed 2L boluses after xray. vo to continue fluids. Pt still stating she is breathing better than earlier. No peripheral edema noted.

## 2016-09-01 NOTE — ED Triage Notes (Addendum)
Per EMS, pt reports shortness of breath for last several days. EN route x3 albuterol,atrovent, 125mg  solu-medrol. Initial o2 saturation 79%. 96% on nonrebreather at time of arrival. EDP aware. RT paged.

## 2016-09-01 NOTE — ED Notes (Signed)
hosptialist In with pt

## 2016-09-01 NOTE — ED Notes (Addendum)
RT at bedside setting up bipap per verbal order EDP.

## 2016-09-01 NOTE — Telephone Encounter (Signed)
Pt called c/o wheezing Needs refill of ProAir sent into [facility] No appts available today Please advise

## 2016-09-01 NOTE — ED Notes (Signed)
Error to docementaion. vanc not finished yet.

## 2016-09-01 NOTE — ED Provider Notes (Signed)
[facility] DEPT Provider Note   CSN: [id number] Arrival date & time: 09/01/16  1524     History   Chief Complaint Chief Complaint  Patient presents with  . Respiratory Distress    HPI Bethany Villa is a 61 y.o. female.  HPI Level V caveat due to respiratory distress. Patient brought in by EMS for 3 days of increasing shortness of breath, wheezing, productive cough and subjective fevers and chills. Patient also endorses central chest pain worse with deep breathing., EMS to have saturations in the high 70s with respiratory distress. Was given under 25 mg Solumedrol and 3 nebulized treatments en route. Patient was recently hospitalized for pneumonia requiring BiPAP and ICU stay. Past Medical History:  Diagnosis Date  . Anxiety   . Asthma   . Chronic pain    lower back  . COPD (chronic obstructive pulmonary disease) (HCC)   . Depression   . DJD (degenerative joint disease)   . Fibromyalgia   . GERD (gastroesophageal reflux disease)   . Headache   . Hyperlipidemia   . Lower extremity edema   . On home oxygen therapy    has not been on since 2016  . Panic attack   . Shortness of breath dyspnea   . Sleep disorder     Patient Active Problem List   Diagnosis Date Noted  . COPD exacerbation (HCC) 09/01/2016  . 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 Acute respiratory failure (HCC)   . Chronic obstructive pulmonary disease with acute lower respiratory infection (HCC)   . Acute respiratory failure with hypoxia (HCC)   . Sepsis (HCC) 09/03/2015  . Respiratory failure, acute (HCC) 09/03/2015  . CAP (community acquired pneumonia) 09/01/2015  . SIRS (systemic inflammatory response syndrome) (HCC) 07/24/2014  . COPD with acute exacerbation (HCC) 09/29/2013  . Insomnia 11/04/2012  . GAD (generalized anxiety disorder) 11/04/2012  . Depression 11/04/2012  . Fibromyalgia 11/04/2012  . Metabolic encephalopathy 10/03/2011  . COPD (chronic obstructive pulmonary disease)  (HCC) 06/01/2011  . Smoking 05/19/2011  . Chronic pain disorder 05/19/2011  . Obesity 05/19/2011  . Hyperglycemia 05/19/2011    Past Surgical History:  Procedure Laterality Date  . ABDOMINAL SURGERY    . ANKLE SURGERY    . BACK SURGERY     lower  . CARPAL TUNNEL RELEASE    . CESAREAN SECTION    . KNEE SURGERY    . TONSILLECTOMY    . TOTAL KNEE ARTHROPLASTY Left 04/14/2016   Procedure: TOTAL KNEE ARTHROPLASTY;  Surgeon: Deeann Saint, MD;  Location: [facility] ORS;  Service: Orthopedics;  Laterality: Left;  . TUBAL LIGATION      OB History    Gravida Para Term Preterm AB Living   3 2 2   1 2    SAB TAB Ectopic Multiple Live Births   1               Home Medications    Prior to Admission medications   Medication Sig Start Date End Date Taking? Authorizing Provider  albuterol (PROAIR HFA) 108 (90 Base) MCG/ACT inhaler INHALE 2 PUFFS INTO THE LUNGS EVERY SIX HOURS AS NEEDED FOR WHEEZING OR SHORTNESS OF BREATH 09/01/16  Yes Mary-Margaret Daphine Deutscher, FNP  albuterol (PROVENTIL) (2.5 MG/3ML) 0.083% nebulizer solution TAKE 3 ML BY NEBULIZATION Q 6 hrs prn FOR WHEEZING OR SOB Dx J44.9 09/01/16   Mary-Margaret Daphine Deutscher, FNP  aspirin EC 325 MG tablet Take 1 tablet (325 mg total) by mouth 2 (two) times daily. 04/18/16  Juanell Fairly, MD  Aspirin-Acetaminophen-Caffeine (GOODY HEADACHE PO) Take 1 packet by mouth daily as needed (headache).    Historical Provider, MD  atorvastatin (LIPITOR) 20 MG tablet TAKE 1 TABLET (20 MG TOTAL) BY MOUTH DAILY. 04/29/16   Mary-Margaret Daphine Deutscher, FNP  cyclobenzaprine (FLEXERIL) 10 MG tablet TAKE 1 TABLET (10 MG TOTAL) BY MOUTH 3 (THREE) TIMES DAILY AS NEEDED FOR MUSCLE SPASMS. 03/04/16   Mary-Margaret Daphine Deutscher, FNP  fluticasone (FLONASE) 50 MCG/ACT nasal spray Place 2 sprays into both nostrils daily. 11/19/14   Mary-Margaret Daphine Deutscher, FNP  furosemide (LASIX) 40 MG tablet Take 40 mg by mouth daily as needed for fluid. Reported on 09/23/2015 06/27/13   Historical Provider, MD    methocarbamol (ROBAXIN) 500 MG tablet Take 500 mg by mouth 2 (two) times daily. 08/28/16   Historical Provider, MD  omeprazole (PRILOSEC) 20 MG capsule TAKE 1 CAPSULE (20 MG TOTAL) BY MOUTH DAILY. 07/28/15   Mary-Margaret Daphine Deutscher, FNP  oxyCODONE (OXY IR/ROXICODONE) 5 MG immediate release tablet Take 1-2 tablets (5-10 mg total) by mouth every 4 (four) hours as needed for moderate pain or severe pain. 04/18/16   Juanell Fairly, MD  topiramate (TOPAMAX) 50 MG tablet TAKE 1 TABLET EVERY DAY 03/29/16   Mary-Margaret Daphine Deutscher, FNP  traMADol (ULTRAM) 50 MG tablet TAKE 1 TABLET BY MOUTH EVERY 4 TO 6 HOURS AS NEEDED 08/10/16   Historical Provider, MD  VENTOLIN HFA 108 (90 Base) MCG/ACT inhaler INHALE 2 PUFFS INTO THE LUNGS DAILY AS NEEDED FOR WHEEZING OR SHORTNESS OF BREATH. 08/02/16   Mary-Margaret Daphine Deutscher, FNP    Family History Family History  Problem Relation Age of Onset  . Cancer Mother     beast    Social History Social History  Substance Use Topics  . Smoking status: Current Every Day Smoker    Packs/day: 1.00    Years: 45.00    Types: Cigarettes  . Smokeless tobacco: Never Used  . Alcohol use 0.0 oz/week     Comment: rare     Allergies   Gabapentin; Mobic [meloxicam]; Penicillins; and Sulfa antibiotics   Review of Systems Review of Systems  Unable to perform ROS: Acuity of condition     Physical Exam Updated Vital Signs BP 107/77   Pulse 117   Temp 100.8 F (38.2 C) (Rectal)   Resp (!) 27   Ht 5\' 3"  (1.6 m)   Wt 187 lb (84.8 kg)   SpO2 97%   BMI 33.13 kg/m   Physical Exam  Constitutional: She is oriented to person, place, and time. She appears well-developed and well-nourished. She appears distressed.  Respiratory distress  HENT:  Head: Normocephalic and atraumatic.  Mouth/Throat: Oropharynx is clear and moist.  Eyes: EOM are normal. Pupils are equal, round, and reactive to light.  Neck: Normal range of motion. Neck supple. No JVD present.  Cardiovascular:  Regular rhythm.   Tachycardia  Pulmonary/Chest:  Increased respiratory effort. Tachypnea. Shallow inhalation with expiratory wheezing  Abdominal: Soft. Bowel sounds are normal. There is no tenderness. There is no rebound and no guarding.  Musculoskeletal: Normal range of motion. She exhibits no edema or tenderness.  No lower extremity swelling, asymmetry or tenderness. Distal pulses intact.  Neurological: She is alert and oriented to person, place, and time.  Patient is awake and alert. Moving all extremities without deficit.  Skin: Skin is warm and dry. Capillary refill takes less than 2 seconds. No rash noted. No erythema.  Psychiatric: She has a normal mood and affect. Her behavior is  normal.  Nursing note and vitals reviewed.    ED Treatments / Results  Labs (all labs ordered are listed, but only abnormal results are displayed) Labs Reviewed  CBC WITH DIFFERENTIAL/PLATELET - Abnormal; Notable for the following:       Result Value   WBC 12.9 (*)    RBC 5.69 (*)    Hemoglobin 16.4 (*)    HCT 52.6 (*)    Monocytes Absolute 1.4 (*)    All other components within normal limits  COMPREHENSIVE METABOLIC PANEL - Abnormal; Notable for the following:    Chloride 99 (*)    Glucose, Bld 128 (*)    Calcium 8.6 (*)    AST 53 (*)    ALT 69 (*)    All other components within normal limits  BRAIN NATRIURETIC PEPTIDE - Abnormal; Notable for the following:    B Natriuretic Peptide 2,086.0 (*)    All other components within normal limits  BLOOD GAS, ARTERIAL - Abnormal; Notable for the following:    pH, Arterial 7.287 (*)    pCO2 arterial 62.8 (*)    pO2, Arterial 69.5 (*)    Acid-Base Excess 3.0 (*)    All other components within normal limits  CULTURE, BLOOD (ROUTINE X 2)  CULTURE, BLOOD (ROUTINE X 2)  URINE CULTURE  TROPONIN I  INFLUENZA PANEL BY PCR (TYPE A & B, H1N1)  URINALYSIS, ROUTINE W REFLEX MICROSCOPIC  I-STAT CG4 LACTIC ACID, ED    EKG  EKG  Interpretation  Date/Time:  Wednesday September 01 2016 15:29:48 EST Ventricular Rate:  139 PR Interval:    QRS Duration: 104 QT Interval:  302 QTC Calculation: 453 R Axis:   10 Text Interpretation:  Sinus tachycardia Multiform ventricular premature complexes LAE, consider biatrial enlargement Inferior infarct, old Posterior infarct, acute (LCx) Anterior infarct, age indeterminate Lateral leads are also involved ST depression V1-V3, suggest recording posterior leads Confirmed by Ranae Palms  MD, Zamari Vea ([id number]) on 09/01/2016 4:38:05 PM       Radiology Dg Chest Port 1 View  Result Date: 09/01/2016 CLINICAL DATA:  The days of shortness of breath. History of COPD. Current smoker. EXAM: PORTABLE CHEST 1 VIEW COMPARISON:  PA and lateral chest x-ray of April 07, 2016 FINDINGS: The lungs are well-expanded. The interstitial markings are increased diffusely. The heart is top-normal in size. The pulmonary vascularity is mildly prominent centrally. There is calcification in the wall of the aortic arch. There is no pleural effusion. The bony thorax exhibits no acute abnormality. IMPRESSION: Bilaterally increased interstitial infiltrates likely reflecting pneumonia. CHF superimposed upon COPD could produce similar findings. Correlation with patient's clinical and laboratory values will be needed. Follow-up radiographs following therapy would be useful to assure return to baseline. Electronically Signed   By: Pantera Winterrowd  [place] M.D.   On: 09/01/2016 15:53    Procedures Procedures (including critical care time)  Medications Ordered in ED Medications  albuterol (PROVENTIL,VENTOLIN) solution continuous neb (10 mg/hr Nebulization New Bag/Given 09/01/16 1544)  sodium chloride 0.9 % bolus 1,000 mL (1,000 mLs Intravenous New Bag/Given 09/01/16 1625)    And  sodium chloride 0.9 % bolus 1,000 mL (not administered)    And  sodium chloride 0.9 % bolus 1,000 mL (not administered)  vancomycin (VANCOCIN) IVPB 1000 mg/200 mL premix  (not administered)  levofloxacin (LEVAQUIN) IVPB 750 mg (not administered)  acetaminophen (TYLENOL) tablet 650 mg (650 mg Oral Given 09/01/16 1700)  levofloxacin (LEVAQUIN) IVPB 750 mg (750 mg Intravenous New Bag/Given 09/01/16 1703)  vancomycin (VANCOCIN)  1,500 mg in sodium chloride 0.9 % 500 mL IVPB (1,500 mg Intravenous New Bag/Given 09/01/16 1627)   CRITICAL CARE Performed by: Ranae PalmsYELVERTON, Jamahl Lemmons Total critical care time: 30 minutes Critical care time was exclusive of separately billable procedures and treating other patients. Critical care was necessary to treat or prevent imminent or life-threatening deterioration. Critical care was time spent personally by me on the following activities: development of treatment plan with patient and/or surrogate as well as nursing, discussions with consultants, evaluation of patient's response to treatment, examination of patient, obtaining history from patient or surrogate, ordering and performing treatments and interventions, ordering and review of laboratory studies, ordering and review of radiographic studies, pulse oximetry and re-evaluation of patient's condition.  Initial Impression / Assessment and Plan / ED Course  I have reviewed the triage vital signs and the nursing notes.  Pertinent labs & imaging results that were available during my care of the patient were reviewed by me and considered in my medical decision making (see chart for details).  Clinical Course    Patient was placed on BiPAP and given continuous albuterol nebulized treatment. Questionable bilateral pneumonia on chest x-ray. Meets sepsis criteria and IV antibiotics and IV fluids given. Patient appears much more comfortable on BiPAP. Maintain saturations in the high 90s.  Final Clinical Impressions(s) / ED Diagnoses   Final diagnoses:  Respiratory distress  COPD exacerbation [facility](HCC)    New Prescriptions New Prescriptions   No medications on file     Loren Raceravid Demetric Dunnaway,  MD 09/01/16 1839

## 2016-09-01 NOTE — ED Notes (Signed)
Fluids to 11325ml/hr per vo dr Ranae Palmsyelverton

## 2016-09-02 ENCOUNTER — Inpatient Hospital Stay (HOSPITAL_COMMUNITY): Payer: Medicare Other

## 2016-09-02 DIAGNOSIS — J189 Pneumonia, unspecified organism: Secondary | ICD-10-CM

## 2016-09-02 DIAGNOSIS — J441 Chronic obstructive pulmonary disease with (acute) exacerbation: Secondary | ICD-10-CM

## 2016-09-02 DIAGNOSIS — J9601 Acute respiratory failure with hypoxia: Secondary | ICD-10-CM

## 2016-09-02 DIAGNOSIS — I509 Heart failure, unspecified: Secondary | ICD-10-CM

## 2016-09-02 DIAGNOSIS — R7989 Other specified abnormal findings of blood chemistry: Secondary | ICD-10-CM

## 2016-09-02 DIAGNOSIS — J9602 Acute respiratory failure with hypercapnia: Secondary | ICD-10-CM

## 2016-09-02 LAB — HEPATIC FUNCTION PANEL
ALBUMIN: 2.7 g/dL — AB (ref 3.5–5.0)
ALK PHOS: 80 U/L (ref 38–126)
ALT: 47 U/L (ref 14–54)
AST: 32 U/L (ref 15–41)
Bilirubin, Direct: 0.3 mg/dL (ref 0.1–0.5)
Indirect Bilirubin: 0.5 mg/dL (ref 0.3–0.9)
TOTAL PROTEIN: 5.9 g/dL — AB (ref 6.5–8.1)
Total Bilirubin: 0.8 mg/dL (ref 0.3–1.2)

## 2016-09-02 LAB — BASIC METABOLIC PANEL
ANION GAP: 4 — AB (ref 5–15)
BUN: 10 mg/dL (ref 6–20)
CALCIUM: 8.2 mg/dL — AB (ref 8.9–10.3)
CO2: 28 mmol/L (ref 22–32)
Chloride: 105 mmol/L (ref 101–111)
Creatinine, Ser: 0.64 mg/dL (ref 0.44–1.00)
GLUCOSE: 164 mg/dL — AB (ref 65–99)
Potassium: 3.6 mmol/L (ref 3.5–5.1)
Sodium: 137 mmol/L (ref 135–145)

## 2016-09-02 LAB — CBC WITH DIFFERENTIAL/PLATELET
BASOS ABS: 0.1 10*3/uL (ref 0.0–0.1)
Basophils Relative: 1 %
Eosinophils Absolute: 0 10*3/uL (ref 0.0–0.7)
Eosinophils Relative: 0 %
HCT: 44 % (ref 36.0–46.0)
HEMOGLOBIN: 13.9 g/dL (ref 12.0–15.0)
LYMPHS PCT: 26 %
Lymphs Abs: 1.6 10*3/uL (ref 0.7–4.0)
MCH: 30 pg (ref 26.0–34.0)
MCHC: 31.6 g/dL (ref 30.0–36.0)
MCV: 95 fL (ref 78.0–100.0)
Monocytes Absolute: 0.2 10*3/uL (ref 0.1–1.0)
Monocytes Relative: 3 %
NEUTROS ABS: 4.1 10*3/uL (ref 1.7–7.7)
Neutrophils Relative %: 70 %
PLATELETS: 110 10*3/uL — AB (ref 150–400)
RBC: 4.63 MIL/uL (ref 3.87–5.11)
RDW: 13.9 % (ref 11.5–15.5)
WBC: 6 10*3/uL (ref 4.0–10.5)

## 2016-09-02 LAB — ECHOCARDIOGRAM COMPLETE
HEIGHTINCHES: 63 in
WEIGHTICAEL: 3118.19 [oz_av]

## 2016-09-02 LAB — LACTIC ACID, PLASMA: LACTIC ACID, VENOUS: 2 mmol/L — AB (ref 0.5–1.9)

## 2016-09-02 LAB — STREP PNEUMONIAE URINARY ANTIGEN: STREP PNEUMO URINARY ANTIGEN: NEGATIVE

## 2016-09-02 MED ORDER — PRO-STAT SUGAR FREE PO LIQD
30.0000 mL | Freq: Every day | ORAL | Status: DC
  Administered 2016-09-03 – 2016-09-07 (×4): 30 mL via ORAL
  Filled 2016-09-02 (×5): qty 30

## 2016-09-02 MED ORDER — GUAIFENESIN-DM 100-10 MG/5ML PO SYRP
5.0000 mL | ORAL_SOLUTION | ORAL | Status: DC | PRN
  Administered 2016-09-03 – 2016-09-06 (×9): 5 mL via ORAL
  Filled 2016-09-02 (×9): qty 5

## 2016-09-02 MED ORDER — METHYLPREDNISOLONE SODIUM SUCC 40 MG IJ SOLR
40.0000 mg | Freq: Two times a day (BID) | INTRAMUSCULAR | Status: DC
  Administered 2016-09-02 – 2016-09-03 (×3): 40 mg via INTRAVENOUS
  Filled 2016-09-02 (×3): qty 1

## 2016-09-02 NOTE — Progress Notes (Signed)
Initial Nutrition Assessment   INTERVENTION:  ProStat 30 ml daily (each 30 ml provides 100 kcal, 15 gr protein)   Agree with Heart Healthy/ CHO modified diet  NUTRITION DIAGNOSIS:   Inadequate oral intake related to acute illness as evidenced by per patient/family report.   GOAL:   Patient will meet greater than or equal to 90% of their needs  MONITOR:   PO intake, Labs, Weight trends  REASON FOR ASSESSMENT:   Consult Assessment of nutrition requirement/status  ASSESSMENT: Bethany Villa  Presents with acute respiratory failure, hyperglycemia, CAP. She has hx of COPD, multiple myeloma, CHF and diabetes.  Patient follows a regular diet at home. She had been eating well until a few days ago and for 3-4 days her intake has been very poor. Her diet is fully advanced and she is feeling much better today. Meal intake is 85%. Her weight is not showing significant changes. She does have some edema to lower extremities.     Nutrition-Focused physical exam findings are no fat depletion, no muscle depletion, and mild lower extremity edema.    Recent Labs Lab 09/01/16 1536 09/02/16 0406  NA 136 137  K 3.6 3.6  CL 99* 105  CO2 28 28  BUN 9 10  CREATININE 0.61 0.64  CALCIUM 8.6* 8.2*  GLUCOSE 128* 164*   Labs and meds : reviewed  Diet Order:  Diet regular Room service appropriate? Yes; Fluid consistency: Thin  Skin:  Reviewed, no issues  Last BM:  09/01/16   Height:   Ht Readings from Last 1 Encounters:  09/01/16 5' 3" (1.6 m)    Weight:   Wt Readings from Last 1 Encounters:  09/02/16 194 lb 14.2 oz (88.4 kg)    Ideal Body Weight:  52.2 kg  BMI:  Body mass index is 34.52 kg/m.  Estimated Nutritional Needs:   Kcal:  1400-1600  Protein:  100-105 gr  Fluid:  1.4-1.6 liters daily  EDUCATION NEEDS:   No education needs identified at this time  Lynn  Bethany,RD,CSG,LDN Office: #[phone] Pager: #[phone]  

## 2016-09-02 NOTE — Clinical Social Work Note (Signed)
CSW received consult for COPD gold protocol. Pt does not meet criteria of 3 or more admissions in 6 months. CSW signing off.  Derenda FennelKara Sheppard Luckenbach, LCSW [phone]

## 2016-09-02 NOTE — Progress Notes (Signed)
CRITICAL VALUE ALERT  Critical value received:  Lactic 2.0  Date of notification:  September 02, 2016  Time of notification:  [id number]   Nurse who received alert:  Bascom Levelsameron Tijuana Scheidegger  MD notified (1st page):  Dr. Blake DivineAkula  Time of first page:  [phone]  Time MD responded: 1100

## 2016-09-02 NOTE — Consult Note (Addendum)
Consult requested by: [facility] Consult requested for: Acute hypoxic respiratory failure  HPI: This is a 61 year old who came to the emergency department because of increasing shortness of breath. She said she started feeling bad about 3 days prior to admission. She had run out of her inhaler. She's had fever cough with purulent sputum. She had been on oxygen in the past she said she's had to use BiPAP in the past and she's had to be intubated in the past. She was placed on BiPAP on admission and says she feels much better now. She denies any chest pain. No nausea or vomiting. No hemoptysis. As mentioned her breathing is much better. No abdominal pain. She has not noticed any swelling of her legs.  Past Medical History:  Diagnosis Date  . Anxiety   . Asthma   . Chronic pain    lower back  . COPD (chronic obstructive pulmonary disease) (HCC)   . Depression   . DJD (degenerative joint disease)   . Fibromyalgia   . GERD (gastroesophageal reflux disease)   . Headache   . Hyperlipidemia   . Lower extremity edema   . On home oxygen therapy    has not been on since 2016  . Panic attack   . Shortness of breath dyspnea   . Sleep disorder      Family History  Problem Relation Age of Onset  . Cancer Mother     beast     Social History   Social History  . Marital status: Divorced    Spouse name: N/A  . Number of children: N/A  . Years of education: N/A   Occupational History  . disabled    Social History Main Topics  . Smoking status: Current Every Day Smoker    Packs/day: 1.00    Years: 45.00    Types: Cigarettes  . Smokeless tobacco: Never Used  . Alcohol use 0.0 oz/week     Comment: rare  . Drug use: No  . Sexual activity: Yes    Birth control/ protection: Surgical   Other Topics Concern  . None   Social History Narrative  . None     ROS: Except as mentioned 10 point review of systems is negative    Objective: Vital signs in last 24 hours: Temp:   [97.6 F (36.4 C)-100.8 F (38.2 C)] 97.8 F (36.6 C) (01/04 0719) Pulse Rate:  [69-142] 95 (01/04 0800) Resp:  [18-38] 26 (01/04 0800) BP: (99-159)/(69-106) 114/80 (01/04 0800) SpO2:  [93 %-100 %] 95 % (01/04 0800) FiO2 (%):  [45 %-50 %] 45 % (01/04 0359) Weight:  [84.8 kg (187 lb)-88.4 kg (194 lb 14.2 oz)] 88.4 kg (194 lb 14.2 oz) (01/04 0615) Weight change:  Last BM Date: 09/01/16  Intake/Output from previous day: 01/03 0701 - 01/04 0700 In: 4080 [P.O.:480; I.V.:750; IV Piggyback:2850] Out: 350 [Urine:350]  PHYSICAL EXAM Constitutional: She is awake and alert and initially was on BiPAP but I switched her to nasal cannula. Mild distress. Eyes: Pupils react. EOMI. Ears nose mouth and throat: Her mucous membranes are moist. Throat is clear. She has increased nasal secretions. Hearing is grossly normal. Cardiovascular: Her heart is regular with normal heart sounds. No edema. Respiratory: Her respiratory effort is slightly increased. Her lungs show some rhonchi and wheezing bilaterally but she is moving air well. Gastrointestinal: Her abdomen is soft without masses. Skin: Warm and dry. Neurological: No focal abnormalities psychiatric: She is mildly anxious in appearance  Lab Results: Basic  Metabolic Panel:  Recent Labs  16/05/9600/03/18 1536 09/02/16 0406  NA 136 137  K 3.6 3.6  CL 99* 105  CO2 28 28  GLUCOSE 128* 164*  BUN 9 10  CREATININE 0.61 0.64  CALCIUM 8.6* 8.2*   Liver Function Tests:  Recent Labs  09/01/16 1536 09/02/16 0406  AST 53* 32  ALT 69* 47  ALKPHOS 112 80  BILITOT 0.8 0.8  PROT 7.7 5.9*  ALBUMIN 3.6 2.7*   No results for input(s): LIPASE, AMYLASE in the last 72 hours. No results for input(s): AMMONIA in the last 72 hours. CBC:  Recent Labs  09/01/16 1536 09/02/16 0406  WBC 12.9* 6.0  NEUTROABS 7.5 4.1  HGB 16.4* 13.9  HCT 52.6* 44.0  MCV 92.4 95.0  PLT 153 110*   Cardiac Enzymes:  Recent Labs  09/01/16 1536  TROPONINI <0.03   BNP: No  results for input(s): PROBNP in the last 72 hours. D-Dimer: No results for input(s): DDIMER in the last 72 hours. CBG: No results for input(s): GLUCAP in the last 72 hours. Hemoglobin A1C: No results for input(s): HGBA1C in the last 72 hours. Fasting Lipid Panel: No results for input(s): CHOL, HDL, LDLCALC, TRIG, CHOLHDL, LDLDIRECT in the last 72 hours. Thyroid Function Tests: No results for input(s): TSH, T4TOTAL, FREET4, T3FREE, THYROIDAB in the last 72 hours. Anemia Panel: No results for input(s): VITAMINB12, FOLATE, FERRITIN, TIBC, IRON, RETICCTPCT in the last 72 hours. Coagulation:  Recent Labs  09/01/16 2029  LABPROT 13.3  INR 1.01   Urine Drug Screen: Drugs of Abuse     Component Value Date/Time   LABOPIA POSITIVE (A) 07/24/2014 0635   COCAINSCRNUR NONE DETECTED 07/24/2014 0635   LABBENZ POSITIVE (A) 07/24/2014 0635   AMPHETMU NONE DETECTED 07/24/2014 0635   THCU NONE DETECTED 07/24/2014 0635   LABBARB NONE DETECTED 07/24/2014 0635    Alcohol Level: No results for input(s): ETH in the last 72 hours. Urinalysis:  Recent Labs  09/01/16 2230  COLORURINE AMBER*  LABSPEC 1.026  PHURINE 6.0  GLUCOSEU NEGATIVE  HGBUR NEGATIVE  BILIRUBINUR NEGATIVE  KETONESUR NEGATIVE  PROTEINUR >=300*  NITRITE NEGATIVE  LEUKOCYTESUR NEGATIVE   Misc. Labs:   ABGS:  Recent Labs  09/01/16 1555  PHART 7.287*  PO2ART 69.5*  HCO3 24.9     MICROBIOLOGY: Recent Results (from the past 240 hour(s))  Culture, blood (Routine X 2) w Reflex to ID Panel     Status: None (Preliminary result)   Collection Time: 09/01/16  3:37 PM  Result Value Ref Range Status   Specimen Description BLOOD RIGHT FOREARM  Final   Special Requests BOTTLES DRAWN AEROBIC AND ANAEROBIC 6CC  Final   Culture NO GROWTH < 24 HOURS  Final   Report Status PENDING  Incomplete  Culture, blood (Routine X 2) w Reflex to ID Panel     Status: None (Preliminary result)   Collection Time: 09/01/16  3:43 PM   Result Value Ref Range Status   Specimen Description LEFT ANTECUBITAL  Final   Special Requests BOTTLES DRAWN AEROBIC AND ANAEROBIC 6CC  Final   Culture NO GROWTH < 24 HOURS  Final   Report Status PENDING  Incomplete  MRSA PCR Screening     Status: None   Collection Time: 09/01/16  8:05 PM  Result Value Ref Range Status   MRSA by PCR NEGATIVE NEGATIVE Final    Comment:        The GeneXpert MRSA Assay (FDA approved for NASAL specimens only), is  one component of a comprehensive MRSA colonization surveillance program. It is not intended to diagnose MRSA infection nor to guide or monitor treatment for MRSA infections.     Studies/Results: Dg Chest Port 1 View  Result Date: 09/01/2016 CLINICAL DATA:  The days of shortness of breath. History of COPD. Current smoker. EXAM: PORTABLE CHEST 1 VIEW COMPARISON:  PA and lateral chest x-ray of April 07, 2016 FINDINGS: The lungs are well-expanded. The interstitial markings are increased diffusely. The heart is top-normal in size. The pulmonary vascularity is mildly prominent centrally. There is calcification in the wall of the aortic arch. There is no pleural effusion. The bony thorax exhibits no acute abnormality. IMPRESSION: Bilaterally increased interstitial infiltrates likely reflecting pneumonia. CHF superimposed upon COPD could produce similar findings. Correlation with patient's clinical and laboratory values will be needed. Follow-up radiographs following therapy would be useful to assure return to baseline. Electronically Signed   By: David  [place] M.D.   On: 09/01/2016 15:53    Medications:  Prior to Admission:  Prescriptions Prior to Admission  Medication Sig Dispense Refill Last Dose  . albuterol (PROVENTIL) (2.5 MG/3ML) 0.083% nebulizer solution TAKE 3 ML BY NEBULIZATION Q 6 hrs prn FOR WHEEZING OR SOB Dx J44.9 75 mL 3 09/01/2016 at Unknown time  . Aspirin-Acetaminophen-Caffeine (GOODY HEADACHE PO) Take 1 packet by mouth daily as needed  (headache).   08/29/2016 at Unknown time  . atorvastatin (LIPITOR) 20 MG tablet TAKE 1 TABLET (20 MG TOTAL) BY MOUTH DAILY. 90 tablet 1 Past Month at Unknown time  . doxylamine, Sleep, (SLEEP AID) 25 MG tablet Take 50 mg by mouth at bedtime.   08/31/2016 at Unknown time  . fluticasone (FLONASE) 50 MCG/ACT nasal spray Place 2 sprays into both nostrils daily. 16 g 1 Past Week at Unknown time  . furosemide (LASIX) 40 MG tablet Take 40 mg by mouth daily as needed for fluid. Reported on 09/23/2015   UNKNOWN  . methocarbamol (ROBAXIN) 500 MG tablet Take 500 mg by mouth 2 (two) times daily.  1 Past Week at Unknown time  . traMADol (ULTRAM) 50 MG tablet TAKE TWO TABLETS BY MOUTH AT BEDTIME  1 08/31/2016 at Unknown time  . VENTOLIN HFA 108 (90 Base) MCG/ACT inhaler INHALE 2 PUFFS INTO THE LUNGS DAILY AS NEEDED FOR WHEEZING OR SHORTNESS OF BREATH. 18 Inhaler 0 UNKNOWN   Scheduled: . aspirin EC  325 mg Oral BID  . atorvastatin  20 mg Oral q1800  . chlorhexidine  15 mL Mouth Rinse BID  . enoxaparin (LOVENOX) injection  40 mg Subcutaneous Q24H  . fluticasone  2 spray Each Nare Daily  . guaiFENesin  600 mg Oral BID  . Influenza vac split quadrivalent PF  0.5 mL Intramuscular Tomorrow-1000  . ipratropium-albuterol  3 mL Nebulization Q4H  . levofloxacin (LEVAQUIN) IV  750 mg Intravenous Q24H  . mouth rinse  15 mL Mouth Rinse q12n4p  . methocarbamol  500 mg Oral BID  . nicotine  21 mg Transdermal Daily  . pantoprazole  40 mg Oral Daily  . sodium chloride  1,000 mL Intravenous Once   And  . sodium chloride  1,000 mL Intravenous Once  . topiramate  50 mg Oral Daily  . vancomycin  1,000 mg Intravenous Q12H   Continuous: . lactated ringers 75 mL/hr at 09/02/16 0600   ZOX:[id number], oxyCODONE, traMADol  Assesment:She was admitted with acute hypoxic and hypercapnic respiratory failure. She has what I believe is community-acquired pneumonia. I reviewed the chest x-ray.  This could be a combination of COPD with  heart failure but her symptoms are much more suggestive of pneumonia. She did have elevated BNP. She was septic on admission and that is much improved. Principal Problem:   Acute respiratory failure with hypoxia and hypercapnia (HCC) Active Problems:   Hyperglycemia   CAP (community acquired pneumonia)   Sepsis (HCC)   COPD exacerbation (HCC)   Elevated brain natriuretic peptide (BNP) level   LFT elevation    Plan: Take her off BiPAP. I think Levaquin is the appropriate antibiotic considering the atypical nature of her infiltrate. Add steroids. Add flutter valve.considering her previous history still has substantial risk of needing intubation and mechanical ventilation or continued BIPAP    LOS: 1 day   Dacey Milberger L 09/02/2016, 8:24 AM

## 2016-09-02 NOTE — Progress Notes (Signed)
PROGRESS NOTE    Bethany Villa  NLG:[id number] DOB: [birth year] DOA: 09/01/2016 PCP: Bennie Pierini, FNP   Brief Narrative: Bethany Villa is a 61 y.o. female with medical history significant of COPD previously on home O2 with h/o intubation presents with worsenign cough, associated with sob, . Was admitted to hospitalist service for acute respiratory failure with hypoxia and hypercapnia and respiratory acidosis. She required BIPAP, and admitted to step down for further evaluation.  Pulmonary consulted and recommendations given.   Assessment & Plan:   Principal Problem:   Acute respiratory failure with hypoxia and hypercapnia (HCC) Active Problems:   Hyperglycemia   CAP (community acquired pneumonia)   Sepsis (HCC)   COPD exacerbation (HCC)   Elevated brain natriuretic peptide (BNP) level   LFT elevation   Acute respiratory failure with acidosis, with hypoxia and hypercapnia: Admitted to step down was on BIPAP, weaned her off bipap to nasal canula oxygen.  Probably secondary to CAP, copd exacerbation and probably chf exacerbation.  Started on Levaquin, blood cultures ordered and negative. Influenza PCR is negative.  Started on IV steroids, duonebs and mucinex.  Elevated BNP, CXR shows bilateral infiltrates.  Echocardiogram ordered to evaluate for CHF.  Negative troponins.  Repeat CXR in 48 hours to evaluate resolution of chf.    Elevated lactic acid: probably from CAP.  Trend lactic acid.    Elevated liver function tests: resolved.    DVT prophylaxis: (Lovenox) Code Status: (Full) Family Communication: none at bedside.  Disposition Plan: pending further evaluation.    Consultants:   Pulmonary consult.    Procedures: echocardiogram.   Antimicrobials: levaquin 1/3   Subjective: Breathing has improved.   Objective: Vitals:   09/02/16 0615 09/02/16 0700 09/02/16 0719 09/02/16 0800  BP:  114/76  114/80  Pulse:  69 82 95  Resp:  20 19 (!) 26  Temp:    97.8 F (36.6 C)   TempSrc:   Axillary   SpO2:  96% 98% 95%  Weight: 88.4 kg (194 lb 14.2 oz)     Height:        Intake/Output Summary (Last 24 hours) at 09/02/16 0930 Last data filed at 09/02/16 0814  Gross per 24 hour  Intake             4080 ml  Output              350 ml  Net             3730 ml   Filed Weights   09/01/16 1600 09/02/16 0615  Weight: 84.8 kg (187 lb) 88.4 kg (194 lb 14.2 oz)    Examination:  General exam: comfortable, off BIPAP. On 4 lit of [place] oxygen.  Respiratory system: Clear to auscultation. Respiratory effort normal. Cardiovascular system: S1 & S2 heard, RRR. No JVD, murmurs, rubs, gallops or clicks. No pedal edema. Gastrointestinal system: Abdomen is nondistended, soft and nontender. No organomegaly or masses felt. Normal bowel sounds heard. Central nervous system: Alert and oriented. No focal neurological deficits. Extremities: Symmetric 5 x 5 power. Skin: No rashes, lesions or ulcers Psychiatry: Judgement and insight appear normal. Mood & affect appropriate.     Data Reviewed: I have personally reviewed following labs and imaging studies  CBC:  Recent Labs Lab 09/01/16 1536 09/02/16 0406  WBC 12.9* 6.0  NEUTROABS 7.5 4.1  HGB 16.4* 13.9  HCT 52.6* 44.0  MCV 92.4 95.0  PLT 153 110*   Basic Metabolic Panel:  Recent Labs Lab  09/01/16 1536 09/02/16 0406  NA 136 137  K 3.6 3.6  CL 99* 105  CO2 28 28  GLUCOSE 128* 164*  BUN 9 10  CREATININE 0.61 0.64  CALCIUM 8.6* 8.2*   GFR: Estimated Creatinine Clearance: 79.8 mL/min (by C-G formula based on SCr of 0.64 mg/dL). Liver Function Tests:  Recent Labs Lab 09/01/16 1536 09/02/16 0406  AST 53* 32  ALT 69* 47  ALKPHOS 112 80  BILITOT 0.8 0.8  PROT 7.7 5.9*  ALBUMIN 3.6 2.7*   No results for input(s): LIPASE, AMYLASE in the last 168 hours. No results for input(s): AMMONIA in the last 168 hours. Coagulation Profile:  Recent Labs Lab 09/01/16 2029  INR 1.01   Cardiac  Enzymes:  Recent Labs Lab 09/01/16 1536  TROPONINI <0.03   BNP (last 3 results) No results for input(s): PROBNP in the last 8760 hours. HbA1C: No results for input(s): HGBA1C in the last 72 hours. CBG: No results for input(s): GLUCAP in the last 168 hours. Lipid Profile: No results for input(s): CHOL, HDL, LDLCALC, TRIG, CHOLHDL, LDLDIRECT in the last 72 hours. Thyroid Function Tests: No results for input(s): TSH, T4TOTAL, FREET4, T3FREE, THYROIDAB in the last 72 hours. Anemia Panel: No results for input(s): VITAMINB12, FOLATE, FERRITIN, TIBC, IRON, RETICCTPCT in the last 72 hours. Sepsis Labs:  Recent Labs Lab 09/01/16 1544 09/01/16 2029 09/01/16 2247  PROCALCITON  --  <0.10  --   LATICACIDVEN 1.31 2.5* 2.3*    Recent Results (from the past 240 hour(s))  Culture, blood (Routine X 2) w Reflex to ID Panel     Status: None (Preliminary result)   Collection Time: 09/01/16  3:37 PM  Result Value Ref Range Status   Specimen Description BLOOD RIGHT FOREARM  Final   Special Requests BOTTLES DRAWN AEROBIC AND ANAEROBIC 6CC  Final   Culture NO GROWTH < 24 HOURS  Final   Report Status PENDING  Incomplete  Culture, blood (Routine X 2) w Reflex to ID Panel     Status: None (Preliminary result)   Collection Time: 09/01/16  3:43 PM  Result Value Ref Range Status   Specimen Description LEFT ANTECUBITAL  Final   Special Requests BOTTLES DRAWN AEROBIC AND ANAEROBIC 6CC  Final   Culture NO GROWTH < 24 HOURS  Final   Report Status PENDING  Incomplete  MRSA PCR Screening     Status: None   Collection Time: 09/01/16  8:05 PM  Result Value Ref Range Status   MRSA by PCR NEGATIVE NEGATIVE Final    Comment:        The GeneXpert MRSA Assay (FDA approved for NASAL specimens only), is one component of a comprehensive MRSA colonization surveillance program. It is not intended to diagnose MRSA infection nor to guide or monitor treatment for MRSA infections.          Radiology  Studies: Dg Chest Port 1 View  Result Date: 09/01/2016 CLINICAL DATA:  The days of shortness of breath. History of COPD. Current smoker. EXAM: PORTABLE CHEST 1 VIEW COMPARISON:  PA and lateral chest x-ray of April 07, 2016 FINDINGS: The lungs are well-expanded. The interstitial markings are increased diffusely. The heart is top-normal in size. The pulmonary vascularity is mildly prominent centrally. There is calcification in the wall of the aortic arch. There is no pleural effusion. The bony thorax exhibits no acute abnormality. IMPRESSION: Bilaterally increased interstitial infiltrates likely reflecting pneumonia. CHF superimposed upon COPD could produce similar findings. Correlation with patient's clinical and  laboratory values will be needed. Follow-up radiographs following therapy would be useful to assure return to baseline. Electronically Signed   By: David  [place] M.D.   On: 09/01/2016 15:53        Scheduled Meds: . aspirin EC  325 mg Oral BID  . atorvastatin  20 mg Oral q1800  . chlorhexidine  15 mL Mouth Rinse BID  . enoxaparin (LOVENOX) injection  40 mg Subcutaneous Q24H  . fluticasone  2 spray Each Nare Daily  . guaiFENesin  600 mg Oral BID  . ipratropium-albuterol  3 mL Nebulization Q4H  . levofloxacin (LEVAQUIN) IV  750 mg Intravenous Q24H  . mouth rinse  15 mL Mouth Rinse q12n4p  . methocarbamol  500 mg Oral BID  . methylPREDNISolone (SOLU-MEDROL) injection  40 mg Intravenous Q12H  . nicotine  21 mg Transdermal Daily  . pantoprazole  40 mg Oral Daily  . sodium chloride  1,000 mL Intravenous Once   And  . sodium chloride  1,000 mL Intravenous Once  . topiramate  50 mg Oral Daily  . vancomycin  1,000 mg Intravenous Q12H   Continuous Infusions: . lactated ringers 75 mL/hr at 09/02/16 0600     LOS: 1 day    Time spent: 35 minutes.     Kathlen Mody, MD [facility] Pager [phone]   If 7PM-7AM, please contact night-coverage [email or web address] Password  [email or web address] 09/02/2016, 9:30 AM

## 2016-09-02 NOTE — Progress Notes (Signed)
*  PRELIMINARY RESULTS* Echocardiogram Echo has been performed.  Bethany Villa, Bethany Villa 09/02/2016, 2:26 PM

## 2016-09-03 ENCOUNTER — Inpatient Hospital Stay (HOSPITAL_COMMUNITY): Payer: Medicare Other

## 2016-09-03 LAB — BLOOD CULTURE ID PANEL (REFLEXED)
Acinetobacter baumannii: NOT DETECTED
CANDIDA GLABRATA: NOT DETECTED
CANDIDA KRUSEI: NOT DETECTED
CANDIDA TROPICALIS: NOT DETECTED
Candida albicans: NOT DETECTED
Candida parapsilosis: NOT DETECTED
ENTEROBACTER CLOACAE COMPLEX: NOT DETECTED
ESCHERICHIA COLI: NOT DETECTED
Enterobacteriaceae species: NOT DETECTED
Enterococcus species: NOT DETECTED
Haemophilus influenzae: NOT DETECTED
KLEBSIELLA PNEUMONIAE: NOT DETECTED
Klebsiella oxytoca: NOT DETECTED
Listeria monocytogenes: NOT DETECTED
Neisseria meningitidis: NOT DETECTED
PROTEUS SPECIES: NOT DETECTED
Pseudomonas aeruginosa: NOT DETECTED
SERRATIA MARCESCENS: NOT DETECTED
STAPHYLOCOCCUS SPECIES: NOT DETECTED
Staphylococcus aureus (BCID): NOT DETECTED
Streptococcus agalactiae: NOT DETECTED
Streptococcus pneumoniae: NOT DETECTED
Streptococcus pyogenes: NOT DETECTED
Streptococcus species: NOT DETECTED

## 2016-09-03 LAB — HIV ANTIBODY (ROUTINE TESTING W REFLEX): HIV SCREEN 4TH GENERATION: NONREACTIVE

## 2016-09-03 LAB — BASIC METABOLIC PANEL
Anion gap: 7 (ref 5–15)
BUN: 17 mg/dL (ref 6–20)
CHLORIDE: 105 mmol/L (ref 101–111)
CO2: 29 mmol/L (ref 22–32)
CREATININE: 0.65 mg/dL (ref 0.44–1.00)
Calcium: 8.2 mg/dL — ABNORMAL LOW (ref 8.9–10.3)
GFR calc Af Amer: >60 mL/min (ref >60)
Glucose, Bld: 126 mg/dL — ABNORMAL HIGH (ref 65–99)
POTASSIUM: 3.6 mmol/L (ref 3.5–5.1)
Sodium: 141 mmol/L (ref 135–145)

## 2016-09-03 LAB — URINE CULTURE: Culture: NO GROWTH

## 2016-09-03 LAB — LACTIC ACID, PLASMA: Lactic Acid, Venous: 2.4 mmol/L (ref 0.5–1.9)

## 2016-09-03 MED ORDER — METHYLPREDNISOLONE SODIUM SUCC 40 MG IJ SOLR
40.0000 mg | Freq: Three times a day (TID) | INTRAMUSCULAR | Status: DC
  Administered 2016-09-03 – 2016-09-07 (×12): 40 mg via INTRAVENOUS
  Filled 2016-09-03 (×12): qty 1

## 2016-09-03 MED ORDER — FUROSEMIDE 10 MG/ML IJ SOLN
40.0000 mg | Freq: Once | INTRAMUSCULAR | Status: AC
  Administered 2016-09-03: 40 mg via INTRAVENOUS
  Filled 2016-09-03: qty 4

## 2016-09-03 NOTE — Care Management Note (Signed)
Case Management Note  Patient Details  Name: Bethany Villa MRN: [id number] Date of Birth: 07/01/1956  Subjective/Objective:    Patient adm with acute respiratory failure with hypoxia and hypercapnia. She currently lives alone and will soon be moving in with her son. She is ind with ADL's. She has no HH/DME PTA. [facility] Offered choice.  Patient will need to be weaned off oxygen prior to discharge.           Action/Plan: Alroy BailiffLinda Lothian of [facility] notified and will obtain orders from chart. [facility] aware [facility]. Cm to follow for oxygen needs.   Expected Discharge Date:  09/04/16              Expected Discharge Plan:  o In-House Referral:  NA  Discharge planning Services  CM Consult  Post Acute Care Choice:  Home Health Choice offered to:  Patient  DME Arranged:    DME Agency:     HH Arranged:  PT HH Agency:  [facility]  Status of Service:  In process, will continue to follow  If discussed at Long Length of Stay Meetings, dates discussed:    Additional Comments:  Derek Laughter, Chrystine OilerSharley Diane, RN 09/03/2016, 4:34 PM

## 2016-09-03 NOTE — Progress Notes (Signed)
PROGRESS NOTE    Bethany Villa  ZOX:[id number] DOB: 16-Aug-1956 DOA: 09/01/2016 PCP: Bennie Pierini, FNP   Brief Narrative: Bethany Villa is a 61 y.o. female with medical history significant of COPD previously on home O2 with h/o intubation presents with worsenign cough, associated with sob, . Was admitted to hospitalist service for acute respiratory failure with hypoxia and hypercapnia and respiratory acidosis. She required BIPAP, and admitted to step down for further evaluation.  Pulmonary consulted and recommendations given.   Assessment & Plan:   Principal Problem:   Acute respiratory failure with hypoxia and hypercapnia (HCC) Active Problems:   Hyperglycemia   CAP (community acquired pneumonia)   Sepsis (HCC)   COPD exacerbation (HCC)   Elevated brain natriuretic peptide (BNP) level   LFT elevation   Acute respiratory failure with acidosis, with hypoxia and hypercapnia: Admitted to step down was on BIPAP, weaned her off bipap to nasal [place] oxygen ,on 4 lit of [place] oxygen. Probably secondary to CAP, copd exacerbation and probably chf exacerbation.  Started on Levaquin, blood cultures ordered and negative. Influenza PCR is negative.  Started on IV steroids, duonebs and mucinex. Transitioned to po prednisone. Elevated BNP, CXR shows bilateral infiltrates. Repeat CXR shows improving infiltrates.  Echocardiogram ordered to evaluate for CHF shows diastolic dysfunction.  Negative troponins.     Elevated lactic acid: probably from CAP.  Trend lactic acid. Still elevated, monitor.    Elevated liver function tests: resolved.    DVT prophylaxis: (Lovenox) Code Status: (Full) Family Communication: none at bedside.  Disposition Plan: pending further evaluation. Transfer to telemetry.    Consultants:   Pulmonary consult.    Procedures: echocardiogram.   Antimicrobials: levaquin 1/3   Subjective: Breathing has improved.   Objective: Vitals:   09/03/16 1023  09/03/16 1100 09/03/16 1200 09/03/16 1350  BP:  135/73 (!) 154/95   Pulse:  (!) 105 (!) 58   Resp:  (!) 30 (!) 21   Temp:   97.6 F (36.4 C)   TempSrc:   Oral   SpO2: 95% 96% (!) 86% 93%  Weight:      Height:        Intake/Output Summary (Last 24 hours) at 09/03/16 1508 Last data filed at 09/03/16 1210  Gross per 24 hour  Intake             1230 ml  Output             1000 ml  Net              230 ml   Filed Weights   09/01/16 1600 09/02/16 0615 09/03/16 0525  Weight: 84.8 kg (187 lb) 88.4 kg (194 lb 14.2 oz) 88.7 kg (195 lb 8.8 oz)    Examination:  General exam: comfortable, off BIPAP. On 4 lit of [place] oxygen.  Respiratory system: basilar rales.  Cardiovascular system: S1 & S2 heard, RRR. No JVD, murmurs, rubs, gallops or clicks. No pedal edema. Gastrointestinal system: Abdomen is nondistended, soft and nontender. No organomegaly or masses felt. Normal bowel sounds heard. Central nervous system: Alert and oriented. No focal neurological deficits. Extremities: Symmetric 5 x 5 power. Skin: No rashes, lesions or ulcers Psychiatry: Judgement and insight appear normal. Mood & affect appropriate.     Data Reviewed: I have personally reviewed following labs and imaging studies  CBC:  Recent Labs Lab 09/01/16 1536 09/02/16 0406  WBC 12.9* 6.0  NEUTROABS 7.5 4.1  HGB 16.4* 13.9  HCT 52.6* 44.0  MCV 92.4 95.0  PLT 153 110*   Basic Metabolic Panel:  Recent Labs Lab 09/01/16 1536 09/02/16 0406 09/03/16 0451  NA 136 137 141  K 3.6 3.6 3.6  CL 99* 105 105  CO2 28 28 29   GLUCOSE 128* 164* 126*  BUN 9 10 17   CREATININE 0.61 0.64 0.65  CALCIUM 8.6* 8.2* 8.2*   GFR: Estimated Creatinine Clearance: 80 mL/min (by C-G formula based on SCr of 0.65 mg/dL). Liver Function Tests:  Recent Labs Lab 09/01/16 1536 09/02/16 0406  AST 53* 32  ALT 69* 47  ALKPHOS 112 80  BILITOT 0.8 0.8  PROT 7.7 5.9*  ALBUMIN 3.6 2.7*   No results for input(s): LIPASE, AMYLASE in  the last 168 hours. No results for input(s): AMMONIA in the last 168 hours. Coagulation Profile:  Recent Labs Lab 09/01/16 2029  INR 1.01   Cardiac Enzymes:  Recent Labs Lab 09/01/16 1536  TROPONINI <0.03   BNP (last 3 results) No results for input(s): PROBNP in the last 8760 hours. HbA1C: No results for input(s): HGBA1C in the last 72 hours. CBG: No results for input(s): GLUCAP in the last 168 hours. Lipid Profile: No results for input(s): CHOL, HDL, LDLCALC, TRIG, CHOLHDL, LDLDIRECT in the last 72 hours. Thyroid Function Tests: No results for input(s): TSH, T4TOTAL, FREET4, T3FREE, THYROIDAB in the last 72 hours. Anemia Panel: No results for input(s): VITAMINB12, FOLATE, FERRITIN, TIBC, IRON, RETICCTPCT in the last 72 hours. Sepsis Labs:  Recent Labs Lab 09/01/16 2029 09/01/16 2247 09/02/16 1008 09/03/16 1025  PROCALCITON <0.10  --   --   --   LATICACIDVEN 2.5* 2.3* 2.0* 2.4*    Recent Results (from the past 240 hour(s))  Culture, blood (Routine X 2) w Reflex to ID Panel     Status: None (Preliminary result)   Collection Time: 09/01/16  3:37 PM  Result Value Ref Range Status   Specimen Description BLOOD RIGHT FOREARM  Final   Special Requests BOTTLES DRAWN AEROBIC AND ANAEROBIC 6CC  Final   Culture NO GROWTH 2 DAYS  Final   Report Status PENDING  Incomplete  Culture, blood (Routine X 2) w Reflex to ID Panel     Status: None (Preliminary result)   Collection Time: 09/01/16  3:43 PM  Result Value Ref Range Status   Specimen Description LEFT ANTECUBITAL  Final   Special Requests BOTTLES DRAWN AEROBIC AND ANAEROBIC 6CC  Final   Culture  Setup Time   Final    GRAM POSITIVE RODS AEROBIC BOTTLE ONLY Gram Stain Report Called to,Read Back By and Verified With: PHILLIPS,C AT 0610 BY HUFFINES,S ON 09/03/16. CRITICAL RESULT CALLED TO, READ BACK BY AND VERIFIED WITH: L SEAY,PHARMD AT 1440 09/03/16 BY L BENFIELD Performed at [facility]    Culture GRAM POSITIVE  RODS  Final   Report Status PENDING  Incomplete  Blood Culture ID Panel (Reflexed)     Status: None   Collection Time: 09/01/16  3:43 PM  Result Value Ref Range Status   Enterococcus species NOT DETECTED NOT DETECTED Final   Listeria monocytogenes NOT DETECTED NOT DETECTED Final   Staphylococcus species NOT DETECTED NOT DETECTED Final   Staphylococcus aureus NOT DETECTED NOT DETECTED Final   Streptococcus species NOT DETECTED NOT DETECTED Final   Streptococcus agalactiae NOT DETECTED NOT DETECTED Final   Streptococcus pneumoniae NOT DETECTED NOT DETECTED Final   Streptococcus pyogenes NOT DETECTED NOT DETECTED Final   Acinetobacter baumannii NOT DETECTED NOT DETECTED Final  Enterobacteriaceae species NOT DETECTED NOT DETECTED Final   Enterobacter cloacae complex NOT DETECTED NOT DETECTED Final   Escherichia coli NOT DETECTED NOT DETECTED Final   Klebsiella oxytoca NOT DETECTED NOT DETECTED Final   Klebsiella pneumoniae NOT DETECTED NOT DETECTED Final   Proteus species NOT DETECTED NOT DETECTED Final   Serratia marcescens NOT DETECTED NOT DETECTED Final   Haemophilus influenzae NOT DETECTED NOT DETECTED Final   Neisseria meningitidis NOT DETECTED NOT DETECTED Final   Pseudomonas aeruginosa NOT DETECTED NOT DETECTED Final   Candida albicans NOT DETECTED NOT DETECTED Final   Candida glabrata NOT DETECTED NOT DETECTED Final   Candida krusei NOT DETECTED NOT DETECTED Final   Candida parapsilosis NOT DETECTED NOT DETECTED Final   Candida tropicalis NOT DETECTED NOT DETECTED Final    Comment: Performed at [facility]  MRSA PCR Screening     Status: None   Collection Time: 09/01/16  8:05 PM  Result Value Ref Range Status   MRSA by PCR NEGATIVE NEGATIVE Final    Comment:        The GeneXpert MRSA Assay (FDA approved for NASAL specimens only), is one component of a comprehensive MRSA colonization surveillance program. It is not intended to diagnose MRSA infection nor to guide  or monitor treatment for MRSA infections.   Urine culture     Status: None   Collection Time: 09/01/16 10:30 PM  Result Value Ref Range Status   Specimen Description URINE, CLEAN CATCH  Final   Special Requests NONE  Final   Culture NO GROWTH Performed at [facility]   Final   Report Status 09/03/2016 FINAL  Final         Radiology Studies: Dg Chest 2 View  Result Date: 09/03/2016 CLINICAL DATA:  Wheezing, sick with pneumonia since last Sunday, history asthma, COPD, fibromyalgia, smoker EXAM: CHEST  2 VIEW COMPARISON:  09/01/2016 FINDINGS: Normal heart size, mediastinal contours and pulmonary vascularity for technique. Atherosclerotic calcification aortic arch. Minimal bronchitic changes. Improved pulmonary infiltrates since previous exam. No infiltrate, pleural effusion or pneumothorax. Bones demineralized. IMPRESSION: Bronchitic changes with improved pulmonary infiltrates since previous study. No new abnormalities. Aortic atherosclerosis. Electronically Signed   By: Ulyses SouthwardMark  Boles M.D.   On: 09/03/2016 13:38   Dg Chest Port 1 View  Result Date: 09/01/2016 CLINICAL DATA:  The days of shortness of breath. History of COPD. Current smoker. EXAM: PORTABLE CHEST 1 VIEW COMPARISON:  PA and lateral chest x-ray of April 07, 2016 FINDINGS: The lungs are well-expanded. The interstitial markings are increased diffusely. The heart is top-normal in size. The pulmonary vascularity is mildly prominent centrally. There is calcification in the wall of the aortic arch. There is no pleural effusion. The bony thorax exhibits no acute abnormality. IMPRESSION: Bilaterally increased interstitial infiltrates likely reflecting pneumonia. CHF superimposed upon COPD could produce similar findings. Correlation with patient's clinical and laboratory values will be needed. Follow-up radiographs following therapy would be useful to assure return to baseline. Electronically Signed   By: David  SwazilandJordan M.D.   On:  09/01/2016 15:53        Scheduled Meds: . aspirin EC  325 mg Oral BID  . atorvastatin  20 mg Oral q1800  . chlorhexidine  15 mL Mouth Rinse BID  . enoxaparin (LOVENOX) injection  40 mg Subcutaneous Q24H  . feeding supplement (PRO-STAT SUGAR FREE 64)  30 mL Oral Daily  . fluticasone  2 spray Each Nare Daily  . guaiFENesin  600 mg Oral BID  .  ipratropium-albuterol  3 mL Nebulization Q4H  . levofloxacin (LEVAQUIN) IV  750 mg Intravenous Q24H  . mouth rinse  15 mL Mouth Rinse q12n4p  . methocarbamol  500 mg Oral BID  . methylPREDNISolone (SOLU-MEDROL) injection  40 mg Intravenous Q8H  . nicotine  21 mg Transdermal Daily  . pantoprazole  40 mg Oral Daily  . sodium chloride  1,000 mL Intravenous Once   And  . sodium chloride  1,000 mL Intravenous Once  . topiramate  50 mg Oral Daily   Continuous Infusions:    LOS: 2 days    Time spent: 35 minutes.     Kathlen Mody, MD [facility] Pager [phone]   If 7PM-7AM, please contact night-coverage [email or web address] Password [email or web address] 09/03/2016, 3:08 PM

## 2016-09-03 NOTE — Progress Notes (Signed)
OT Cancellation Note  Patient Details Name: Bethany Villa MRN: [id number] DOB: 07/18/1956   Cancelled Treatment:     Reason evaluation not completed: Pt unavailable. Pt off floor for testing, will attempt evaluation at a later time if possible.   Ezra SitesLeslie Desirey Keahey, OTR/L  [phone] 09/03/2016, 1:33 PM

## 2016-09-03 NOTE — Evaluation (Signed)
Physical Therapy Evaluation Patient Details Name: Bethany Villa MRN: [id number] DOB: 06/09/1956 Today's Date: 09/03/2016   History of Present Illness  61 y.o. female with medical history significant of COPD previously on home O2 with h/o intubations.  She reports that she started feeling bad on Sunday.  Her inhaler ran out.  Had been using more than usual for a week.  Subjective fever.  +productive cough - change in purulence and volume.  +SOB.  Called EMS and put her on BIPAP.   Dx: Acute respiratory failure from COPD and CAP causing sepsis.    Clinical Impression  Pt received in bed, and was agreeable to PT evaluation.   Pt lives alone in an apartment, but states she is independent with ADL's.  She is reliant on others for driving due to needing to get glasses to renew her license.  Her son lives in [place] and will come to help with running errands occasionally.  Pt required supervision for bed mobility, sit<>stand and gait x 3810ft today.  Pt's gait distance is limited due to fatigue with SpO2 desaturation to 87% on 4L of O2, as well as elevated HR ranging from 102-131bpm during PT session today.  Pt demonstrates need for continued skilled PT intervention to improve functional mobility and endurance to optimize her return to PLOF and back home.  Recommend that she have HHPT upon d/c.     Follow Up Recommendations Home health PT    Equipment Recommendations  None recommended by PT    Recommendations for Other Services       Precautions / Restrictions Precautions Precautions: Fall Precaution Comments: Pt states that she had a lot of falls prior to her knee surgery back in august 2017 Restrictions Weight Bearing Restrictions: No      Mobility  Bed Mobility Overal bed mobility: Modified Independent             General bed mobility comments: HOB is elevated - pt sleeps on 2 pillows at home  Transfers Overall transfer level: Needs assistance Equipment used: None Transfers: Sit to/from  Stand Sit to Stand: Supervision            Ambulation/Gait Ambulation/Gait assistance: Supervision Ambulation Distance (Feet): 10 Feet Assistive device: None Gait Pattern/deviations: Step-through pattern;Trunk flexed     General Gait Details: decreased gait distance due to fatigue as well as SpO2 desaturation with minimal exertion today.  Pt remained on 4L and desaturated to 87%.  Quickly improved to 95% with seated rest and vc's for PLB and relaxation with shoulder depression.   Stairs            Wheelchair Mobility    Modified Rankin (Stroke Patients Only)       Balance Overall balance assessment: History of Falls                                           Pertinent Vitals/Pain Pain Assessment: No/denies pain    Home Living   Living Arrangements: Alone   Type of Home: Apartment (Pt states she is planning to move [place] to [place] next month where her son is.  ) Home Access: Stairs to enter   Enter[employer] CorporationEntrance Stairs-Number of Steps: 9 with HR.   Home Layout: One level Home Equipment: Grab bars - tub/shower;Walker - 2 wheels;Bedside commode;Shower seat      Prior Function Level of Independence: Independent with assistive  device(s)      ADL's / Homemaking Assistance Needed: independent, not driving - needed to get glasses, and  car being worked on.  Son comes up from [facility]        Hand Dominance        Extremity/Trunk Assessment   Upper Extremity Assessment Upper Extremity Assessment: Overall WFL for tasks assessed    Lower Extremity Assessment Lower Extremity Assessment: Overall WFL for tasks assessed       Communication   Communication: No difficulties  Cognition Arousal/Alertness: Awake/alert Behavior During Therapy: WFL for tasks assessed/performed Overall Cognitive Status: Within Functional Limits for tasks assessed                      General Comments      Exercises     Assessment/Plan    PT Assessment  Patient needs continued PT services  PT Problem List Decreased activity tolerance;Decreased mobility;Cardiopulmonary status limiting activity          PT Treatment Interventions Gait training;Stair training;Functional mobility training;Therapeutic activities;Therapeutic exercise;Patient/family education    PT Goals (Current goals can be found in the Care Plan section)  Acute Rehab PT Goals Patient Stated Goal: Pt expressed desire to go home.  PT Goal Formulation: With patient Time For Goal Achievement: 09/10/16 Potential to Achieve Goals: Good    Frequency Min 3X/week   Barriers to discharge Decreased caregiver support Pt lives at home alone    Co-evaluation               End of Session Equipment Utilized During Treatment: Gait belt;Oxygen Activity Tolerance: Patient limited by fatigue Patient left: in bed (Pt declined sitting up in the chair due to not sleeping well last night.  ) Nurse Communication: Mobility status Hydrographic surveyor, Charity fundraiser.  Mobility sheet left up in the room. )    Functional Assessment Tool Used: [employer] AM-PAC "6-clicks"  Functional Limitation: Mobility: Walking and moving around Mobility: Walking and Moving Around Current Status [phone]): At least 20 percent but less than 40 percent impaired, limited or restricted Mobility: Walking and Moving Around Goal Status [phone]): At least 1 percent but less than 20 percent impaired, limited or restricted    Time: [phone] PT Time Calculation (min) (ACUTE ONLY): 24 min   Charges:   PT Evaluation $PT Eval Low Complexity: 1 Procedure PT Treatments $Therapeutic Activity: 8-22 mins   PT G Codes:   PT G-Codes **NOT FOR INPATIENT CLASS** Functional Assessment Tool Used: [employer] "6-clicks"  Functional Limitation: Mobility: Walking and moving around Mobility: Walking and Moving Around Current Status [phone]): At least 20 percent but less than 40 percent impaired, limited or restricted Mobility:  Walking and Moving Around Goal Status [phone]): At least 1 percent but less than 20 percent impaired, limited or restricted    Beth Hattie Aguinaldo, PT, DPT X: [phone]

## 2016-09-03 NOTE — Progress Notes (Signed)
CRITICAL VALUE ALERT  Critical value received:  Lactic Acid - 2.4  Date of notification:  09/03/16  Time of notification:  1102  Critical value read back:Yes.    Nurse who received alert:  Zara Chessrystal Chrisotpher Rivero, RN  MD notified (1st page):  Dr.Akula   Time of first page:  1103  MD notified (2nd page):  Time of second page:  Responding MD:    Time MD responded:

## 2016-09-03 NOTE — Care Management Important Message (Signed)
Important Message  Patient Details  Name: Bethany Villa MRN: [id number] Date of Birth: 07/04/1956   Medicare Important Message Given:  Yes    Zakariyya Helfman, Chrystine OilerSharley Diane, RN 09/03/2016, 4:49 PM

## 2016-09-03 NOTE — Progress Notes (Signed)
Subjective: She did not have to use BiPAP last night. She says she still short of breath when she moves around like goes to the bedside commode. She's not coughing much. She has no other new complaints. No chest pain. No nausea or vomiting. Still short of breath as mentioned.  Objective: Vital signs in last 24 hours: Temp:  [97.5 F (36.4 C)-98.1 F (36.7 C)] 97.8 F (36.6 C) (01/05 0000) Pulse Rate:  [71-150] 99 (01/05 0644) Resp:  [20-38] 23 (01/05 0644) BP: (110-154)/(66-99) 145/93 (01/05 0600) SpO2:  [81 %-99 %] 98 % (01/05 0644) Weight:  [88.7 kg (195 lb 8.8 oz)] 88.7 kg (195 lb 8.8 oz) (01/05 0525) Weight change: 3.877 kg (8 lb 8.8 oz) Last BM Date: 09/01/16  Intake/Output from previous day: 01/04 0701 - 01/05 0700 In: 1530 [P.O.:1380; IV Piggyback:150] Out: 750 [Urine:750]  PHYSICAL EXAM General appearance: alert, cooperative and mild distress Resp: She has diminished breath sounds and prolonged expiratory phase. Increased respiratory effort but much better than yesterday Cardio: Heart is regular with no gallop. GI: soft, non-tender; bowel sounds normal; no masses,  no organomegaly Extremities: extremities normal, atraumatic, no cyanosis or edema Skin warm and dry. Mucous membranes are moist. Pupils react  Lab Results:  Results for orders placed or performed during the hospital encounter of 09/01/16 (from the past 48 hour(s))  CBC with Differential/Platelet     Status: Abnormal   Collection Time: 09/01/16  3:36 PM  Result Value Ref Range   WBC 12.9 (H) 4.0 - 10.5 K/uL   RBC 5.69 (H) 3.87 - 5.11 MIL/uL   Hemoglobin 16.4 (H) 12.0 - 15.0 g/dL   HCT 52.6 (H) 36.0 - 46.0 %   MCV 92.4 78.0 - 100.0 fL   MCH 28.8 26.0 - 34.0 pg   MCHC 31.2 30.0 - 36.0 g/dL   RDW 14.1 11.5 - 15.5 %   Platelets 153 150 - 400 K/uL   Neutrophils Relative % 58 %   Lymphocytes Relative 31 %   Monocytes Relative 11 %   Eosinophils Relative 0 %   Basophils Relative 0 %   Neutro Abs 7.5 1.7 -  7.7 K/uL   Lymphs Abs 4.0 0.7 - 4.0 K/uL   Monocytes Absolute 1.4 (H) 0.1 - 1.0 K/uL   Eosinophils Absolute 0.0 0.0 - 0.7 K/uL   Basophils Absolute 0.0 0.0 - 0.1 K/uL   WBC Morphology ATYPICAL LYMPHOCYTES   Comprehensive metabolic panel     Status: Abnormal   Collection Time: 09/01/16  3:36 PM  Result Value Ref Range   Sodium 136 135 - 145 mmol/[place]   Potassium 3.6 3.5 - 5.1 mmol/[place]   Chloride 99 ([place]) 101 - 111 mmol/[place]   CO2 28 22 - 32 mmol/[place]   Glucose, Bld 128 (H) 65 - 99 mg/dL   BUN 9 6 - 20 mg/dL   Creatinine, Ser 0.61 0.44 - 1.00 mg/dL   Calcium 8.6 ([place]) 8.9 - 10.3 mg/dL   Total Protein 7.7 6.5 - 8.1 g/dL   Albumin 3.6 3.5 - 5.0 g/dL   AST 53 (H) 15 - 41 U/[place]   ALT 69 (H) 14 - 54 U/[place]   Alkaline Phosphatase 112 38 - 126 U/[place]   Total Bilirubin 0.8 0.3 - 1.2 mg/dL   GFR calc non Af Amer >60 >60 mL/min   GFR calc Af Amer >60 >60 mL/min    Comment: (NOTE) The eGFR has been calculated using the CKD EPI equation. This calculation has not been validated  in all clinical situations. eGFR's persistently <60 mL/min signify possible Chronic Kidney Disease.    Anion gap 9 5 - 15  Brain natriuretic peptide     Status: Abnormal   Collection Time: 09/01/16  3:36 PM  Result Value Ref Range   B Natriuretic Peptide 2,086.0 (H) 0.0 - 100.0 pg/mL  Troponin I     Status: None   Collection Time: 09/01/16  3:36 PM  Result Value Ref Range   Troponin I <0.03 <0.03 ng/mL  Culture, blood (Routine X 2) w Reflex to ID Panel     Status: None (Preliminary result)   Collection Time: 09/01/16  3:37 PM  Result Value Ref Range   Specimen Description BLOOD RIGHT FOREARM    Special Requests BOTTLES DRAWN AEROBIC AND ANAEROBIC 6CC    Culture NO GROWTH < 24 HOURS    Report Status PENDING   Influenza panel by PCR (type A & B, H1N1)     Status: None   Collection Time: 09/01/16  3:41 PM  Result Value Ref Range   Influenza A By PCR NEGATIVE NEGATIVE   Influenza B By PCR NEGATIVE NEGATIVE    Comment: (NOTE) The  Xpert Xpress Flu assay is intended as an aid in the diagnosis of  influenza and should not be used as a sole basis for treatment.  This  assay is FDA approved for nasopharyngeal swab specimens only. Nasal  washings and aspirates are unacceptable for Xpert Xpress Flu testing.   Culture, blood (Routine X 2) w Reflex to ID Panel     Status: None (Preliminary result)   Collection Time: 09/01/16  3:43 PM  Result Value Ref Range   Specimen Description LEFT ANTECUBITAL    Special Requests BOTTLES DRAWN AEROBIC AND ANAEROBIC 6CC    Culture  Setup Time      GRAM POSITIVE RODS AEROBIC BOTTLE ONLY Gram Stain Report Called to,Read Back By and Verified With: PHILLIPS,C AT 6712 BY HUFFINES,S ON 09/03/16.    Culture NO GROWTH < 24 HOURS    Report Status PENDING   I-Stat CG4 Lactic Acid, ED     Status: None   Collection Time: 09/01/16  3:44 PM  Result Value Ref Range   Lactic Acid, Venous 1.31 0.5 - 1.9 mmol/[place]  Blood gas, arterial ([facility] & [facility] ONLY)     Status: Abnormal   Collection Time: 09/01/16  3:55 PM  Result Value Ref Range   FIO2 50.00    Delivery systems BILEVEL POSITIVE AIRWAY PRESSURE    Mode BILEVEL POSITIVE AIRWAY PRESSURE    Inspiratory PAP 18    Expiratory PAP 8    pH, Arterial 7.287 ([place]) 7.350 - 7.450   pCO2 arterial 62.8 (H) 32.0 - 48.0 mmHg    Comment: CRITICAL RESULT CALLED TO, READ BACK BY AND VERIFIED WITH: EDWARDS,C.RN AT 1608 09/01/16 BY BROADNAX,[place]illa.RRT    pO2, Arterial 69.5 ([place]) 83.0 - 108.0 mmHg   Bicarbonate 24.9 20.0 - 28.0 mmol/[place]   Acid-Base Excess 3.0 (H) 0.0 - 2.0 mmol/[place]   O2 Saturation 90.6 %   Collection site RIGHT RADIAL    Drawn by [id number]    Sample type ARTERIAL    Allens test (pass/fail) PASS PASS  MRSA PCR Screening     Status: None   Collection Time: 09/01/16  8:05 PM  Result Value Ref Range   MRSA by PCR NEGATIVE NEGATIVE    Comment:        The GeneXpert MRSA Assay (FDA approved for NASAL specimens  only), is one component of a comprehensive MRSA  colonization surveillance program. It is not intended to diagnose MRSA infection nor to guide or monitor treatment for MRSA infections.   Lactic acid, plasma     Status: Abnormal   Collection Time: 09/01/16  8:29 PM  Result Value Ref Range   Lactic Acid, Venous 2.5 (HH) 0.5 - 1.9 mmol/[place]    Comment: CRITICAL RESULT CALLED TO, READ BACK BY AND VERIFIED WITH: FREEMAN,[place] AT 2120 ON 09/02/2015 BY ISLEY,B   Procalcitonin     Status: None   Collection Time: 09/01/16  8:29 PM  Result Value Ref Range   Procalcitonin <0.10 ng/mL    Comment:        Interpretation: PCT (Procalcitonin) <= 0.5 ng/mL: Systemic infection (sepsis) is not likely. Local bacterial infection is possible. (NOTE)         ICU PCT Algorithm               Non ICU PCT Algorithm    ----------------------------     ------------------------------         PCT < 0.25 ng/mL                 PCT < 0.1 ng/mL     Stopping of antibiotics            Stopping of antibiotics       strongly encouraged.               strongly encouraged.    ----------------------------     ------------------------------       PCT level decrease by               PCT < 0.25 ng/mL       >= 80% from peak PCT       OR PCT 0.25 - 0.5 ng/mL          Stopping of antibiotics                                             encouraged.     Stopping of antibiotics           encouraged.    ----------------------------     ------------------------------       PCT level decrease by              PCT >= 0.25 ng/mL       < 80% from peak PCT        AND PCT >= 0.5 ng/mL            Continuin g antibiotics                                              encouraged.       Continuing antibiotics            encouraged.    ----------------------------     ------------------------------     PCT level increase compared          PCT > 0.5 ng/mL         with peak PCT AND          PCT >= 0.5 ng/mL             Escalation of antibiotics  strongly  encouraged.      Escalation of antibiotics        strongly encouraged.   Protime-INR     Status: None   Collection Time: 09/01/16  8:29 PM  Result Value Ref Range   Prothrombin Time 13.3 11.4 - 15.2 seconds   INR 1.01   APTT     Status: None   Collection Time: 09/01/16  8:29 PM  Result Value Ref Range   aPTT 28 24 - 36 seconds  HIV antibody     Status: None   Collection Time: 09/01/16  8:29 PM  Result Value Ref Range   HIV Screen 4th Generation wRfx Non Reactive Non Reactive    Comment: (NOTE) Performed At: [facility] [place], [place] [id number] Lindon Romp MD RP:[id number]   Urinalysis, Routine w reflex microscopic     Status: Abnormal   Collection Time: 09/01/16 10:30 PM  Result Value Ref Range   Color, Urine AMBER (A) YELLOW    Comment: BIOCHEMICALS MAY BE AFFECTED BY COLOR   APPearance TURBID (A) CLEAR   Specific Gravity, Urine 1.026 1.005 - 1.030   pH 6.0 5.0 - 8.0   Glucose, UA NEGATIVE NEGATIVE mg/dL   Hgb urine dipstick NEGATIVE NEGATIVE   Bilirubin Urine NEGATIVE NEGATIVE   Ketones, ur NEGATIVE NEGATIVE mg/dL   Protein, ur >=300 (A) NEGATIVE mg/dL   Nitrite NEGATIVE NEGATIVE   Leukocytes, UA NEGATIVE NEGATIVE   RBC / HPF 0-5 0 - 5 RBC/hpf   WBC, UA 6-30 0 - 5 WBC/hpf   Bacteria, UA FEW (A) NONE SEEN   Mucous PRESENT    Hyaline Casts, UA PRESENT    Granular Casts, UA PRESENT   Strep pneumoniae urinary antigen     Status: None   Collection Time: 09/01/16 10:30 PM  Result Value Ref Range   Strep Pneumo Urinary Antigen NEGATIVE NEGATIVE    Comment:        Infection due to S. pneumoniae cannot be absolutely ruled out since the antigen present may be below the detection limit of the test. Performed at [facility]   Lactic acid, plasma     Status: Abnormal   Collection Time: 09/01/16 10:47 PM  Result Value Ref Range   Lactic Acid, Venous 2.3 (HH) 0.5 - 1.9 mmol/[place]    Comment: CRITICAL RESULT CALLED TO, READ BACK BY AND  VERIFIED WITH: PHILLIPS C AT 2338 ON [id number] BY FORSYTH K   Basic metabolic panel     Status: Abnormal   Collection Time: 09/02/16  4:06 AM  Result Value Ref Range   Sodium 137 135 - 145 mmol/[place]   Potassium 3.6 3.5 - 5.1 mmol/V[place]   Chloride 105 101 - 111 mmol/[place]   CO2 28 22 - 32 mmol/[place]   Glucose, Bld 164 (H) 65 - 99 mg/dL   BUN 10 6 - 20 mg/dL   Creatinine, Ser 0.64 0.44 - 1.00 mg/dL   Calcium 8.2 ([place]) 8.9 - 10.3 mg/dL   GFR calc non Af Amer >60 >60 mL/min   GFR calc Af Amer >60 >60 mL/min    Comment: (NOTE) The eGFR has been calculated using the CKD EPI equation. This calculation has not been validated in all clinical situations. eGFR's persistently <60 mL/min signify possible Chronic Kidney Disease.    Anion gap 4 ([place]) 5 - 15  CBC WITH DIFFERENTIAL     Status: Abnormal   Collection Time: 09/02/16  4:06 AM  Result Value Ref  Range   WBC 6.0 4.0 - 10.5 K/uL   RBC 4.63 3.87 - 5.11 MIL/uL   Hemoglobin 13.9 12.0 - 15.0 g/dL   HCT 44.0 36.0 - 46.0 %   MCV 95.0 78.0 - 100.0 fL   MCH 30.0 26.0 - 34.0 pg   MCHC 31.6 30.0 - 36.0 g/dL   RDW 13.9 11.5 - 15.5 %   Platelets 110 ([place]) 150 - 400 K/uL    Comment: SPECIMEN CHECKED FOR CLOTS PLATELET COUNT CONFIRMED BY SMEAR    Neutrophils Relative % 70 %   Lymphocytes Relative 26 %   Monocytes Relative 3 %   Eosinophils Relative 0 %   Basophils Relative 1 %   Neutro Abs 4.1 1.7 - 7.7 K/uL   Lymphs Abs 1.6 0.7 - 4.0 K/uL   Monocytes Absolute 0.2 0.1 - 1.0 K/uL   Eosinophils Absolute 0.0 0.0 - 0.7 K/uL   Basophils Absolute 0.1 0.0 - 0.1 K/uL   WBC Morphology ATYPICAL LYMPHOCYTES   Hepatic function panel     Status: Abnormal   Collection Time: 09/02/16  4:06 AM  Result Value Ref Range   Total Protein 5.9 ([place]) 6.5 - 8.1 g/dL   Albumin 2.7 ([place]) 3.5 - 5.0 g/dL   AST 32 15 - 41 U/[place]   ALT 47 14 - 54 U/[place]illa   Alkaline Phosphatase 80 38 - 126 U/[place]   Total Bilirubin 0.8 0.3 - 1.2 mg/dL   Bilirubin, Direct 0.3 0.1 - 0.5 mg/dL   Indirect Bilirubin 0.5  0.3 - 0.9 mg/dL  Lactic acid, plasma     Status: Abnormal   Collection Time: 09/02/16 10:08 AM  Result Value Ref Range   Lactic Acid, Venous 2.0 (HH) 0.5 - 1.9 mmol/[place]    Comment: CRITICAL RESULT CALLED TO, READ BACK BY AND VERIFIED WITH: HOWARD,C AT 10:55AM ON 09/02/16 BY [facility]   Basic metabolic panel     Status: Abnormal   Collection Time: 09/03/16  4:51 AM  Result Value Ref Range   Sodium 141 135 - 145 mmol/[place]   Potassium 3.6 3.5 - 5.1 mmol/Villa   Chloride 105 101 - 111 mmol/[place]illa   CO2 29 22 - 32 mmol/[place]   Glucose, Bld 126 (H) 65 - 99 mg/dL   BUN 17 6 - 20 mg/dL   Creatinine, Ser 0.65 0.44 - 1.00 mg/dL   Calcium 8.2 ([place]) 8.9 - 10.3 mg/dL   GFR calc non Af Amer >60 >60 mL/min   GFR calc Af Amer >60 >60 mL/min    Comment: (NOTE) The eGFR has been calculated using the CKD EPI equation. This calculation has not been validated in all clinical situations. eGFR's persistently <60 mL/min signify possible Chronic Kidney Disease.    Anion gap 7 5 - 15    ABGS  Recent Labs  09/01/16 1555  PHART 7.287*  PO2ART 69.5*  HCO3 24.9   CULTURES Recent Results (from the past 240 hour(s))  Culture, blood (Routine X 2) w Reflex to ID Panel     Status: None (Preliminary result)   Collection Time: 09/01/16  3:37 PM  Result Value Ref Range Status   Specimen Description BLOOD RIGHT FOREARM  Final   Special Requests BOTTLES DRAWN AEROBIC AND ANAEROBIC 6CC  Final   Culture NO GROWTH < 24 HOURS  Final   Report Status PENDING  Incomplete  Culture, blood (Routine X 2) w Reflex to ID Panel     Status: None (Preliminary result)   Collection Time: 09/01/16  3:43  PM  Result Value Ref Range Status   Specimen Description LEFT ANTECUBITAL  Final   Special Requests BOTTLES DRAWN AEROBIC AND ANAEROBIC 6CC  Final   Culture  Setup Time   Final    GRAM POSITIVE RODS AEROBIC BOTTLE ONLY Gram Stain Report Called to,Read Back By and Verified With: PHILLIPS,C AT 1219 BY HUFFINES,S ON 09/03/16.    Culture NO  GROWTH < 24 HOURS  Final   Report Status PENDING  Incomplete  MRSA PCR Screening     Status: None   Collection Time: 09/01/16  8:05 PM  Result Value Ref Range Status   MRSA by PCR NEGATIVE NEGATIVE Final    Comment:        The GeneXpert MRSA Assay (FDA approved for NASAL specimens only), is one component of a comprehensive MRSA colonization surveillance program. It is not intended to diagnose MRSA infection nor to guide or monitor treatment for MRSA infections.    Studies/Results: Dg Chest Port 1 View  Result Date: 09/01/2016 CLINICAL DATA:  The days of shortness of breath. History of COPD. Current smoker. EXAM: PORTABLE CHEST 1 VIEW COMPARISON:  PA and lateral chest x-ray of April 07, 2016 FINDINGS: The lungs are well-expanded. The interstitial markings are increased diffusely. The heart is top-normal in size. The pulmonary vascularity is mildly prominent centrally. There is calcification in the wall of the aortic arch. There is no pleural effusion. The bony thorax exhibits no acute abnormality. IMPRESSION: Bilaterally increased interstitial infiltrates likely reflecting pneumonia. CHF superimposed upon COPD could produce similar findings. Correlation with patient's clinical and laboratory values will be needed. Follow-up radiographs following therapy would be useful to assure return to baseline. Electronically Signed   By: David  Martinique M.D.   On: 09/01/2016 15:53    Medications:  Prior to Admission:  Prescriptions Prior to Admission  Medication Sig Dispense Refill Last Dose  . albuterol (PROVENTIL) (2.5 MG/3ML) 0.083% nebulizer solution TAKE 3 ML BY NEBULIZATION Q 6 hrs prn FOR WHEEZING OR SOB Dx J44.9 75 mL 3 09/01/2016 at Unknown time  . Aspirin-Acetaminophen-Caffeine (GOODY HEADACHE PO) Take 1 packet by mouth daily as needed (headache).   08/29/2016 at Unknown time  . atorvastatin (LIPITOR) 20 MG tablet TAKE 1 TABLET (20 MG TOTAL) BY MOUTH DAILY. 90 tablet 1 Past Month at Unknown  time  . doxylamine, Sleep, (SLEEP AID) 25 MG tablet Take 50 mg by mouth at bedtime.   08/31/2016 at Unknown time  . fluticasone (FLONASE) 50 MCG/ACT nasal spray Place 2 sprays into both nostrils daily. 16 g 1 Past Week at Unknown time  . furosemide (LASIX) 40 MG tablet Take 40 mg by mouth daily as needed for fluid. Reported on 09/23/2015   UNKNOWN  . methocarbamol (ROBAXIN) 500 MG tablet Take 500 mg by mouth 2 (two) times daily.  1 Past Week at Unknown time  . traMADol (ULTRAM) 50 MG tablet TAKE TWO TABLETS BY MOUTH AT BEDTIME  1 08/31/2016 at Unknown time  . VENTOLIN HFA 108 (90 Base) MCG/ACT inhaler INHALE 2 PUFFS INTO THE LUNGS DAILY AS NEEDED FOR WHEEZING OR SHORTNESS OF BREATH. 18 Inhaler 0 UNKNOWN   Scheduled: . aspirin EC  325 mg Oral BID  . atorvastatin  20 mg Oral q1800  . chlorhexidine  15 mL Mouth Rinse BID  . enoxaparin (LOVENOX) injection  40 mg Subcutaneous Q24H  . feeding supplement (PRO-STAT SUGAR FREE 64)  30 mL Oral Daily  . fluticasone  2 spray Each Nare Daily  .  guaiFENesin  600 mg Oral BID  . ipratropium-albuterol  3 mL Nebulization Q4H  . levofloxacin (LEVAQUIN) IV  750 mg Intravenous Q24H  . mouth rinse  15 mL Mouth Rinse q12n4p  . methocarbamol  500 mg Oral BID  . methylPREDNISolone (SOLU-MEDROL) injection  40 mg Intravenous Q12H  . nicotine  21 mg Transdermal Daily  . pantoprazole  40 mg Oral Daily  . sodium chloride  1,000 mL Intravenous Once   And  . sodium chloride  1,000 mL Intravenous Once  . topiramate  50 mg Oral Daily   Continuous:  BWN:[id number], guaiFENesin-dextromethorphan, oxyCODONE, traMADol  Assesment: She was admitted with community-acquired pneumonia which is diffuse, COPD exacerbation and acute hypoxic/hypercapnic respiratory failure. She has been sick enough to require intubation in the past. She did not need BiPAP last night. She is still short of breath which I think is to be expected considering the diffuse pneumonia. Principal Problem:    Acute respiratory failure with hypoxia and hypercapnia (HCC) Active Problems:   Hyperglycemia   CAP (community acquired pneumonia)   Sepsis ([place])   COPD exacerbation (HCC)   Elevated brain natriuretic peptide (BNP) level   LFT elevation    Plan: Continue current treatments. No change in medications. I will be out of town from about 1:00 this afternoon until Monday morning on the eighth. If she is still in the hospital I will see her then and  make outpatient plans for follow-up. For now continue IV steroids IV antibiotics etc.    LOS: 2 days   Bethany Villa 09/03/2016, 8:12 AM

## 2016-09-04 MED ORDER — LEVOFLOXACIN 750 MG PO TABS
750.0000 mg | ORAL_TABLET | Freq: Every day | ORAL | Status: DC
  Administered 2016-09-04 – 2016-09-06 (×3): 750 mg via ORAL
  Filled 2016-09-04 (×3): qty 1

## 2016-09-04 NOTE — Progress Notes (Signed)
Pharmacy Antibiotic Note  Bethany Villa is a 61 y.o. female admitted on 09/01/2016 with pneumonia.  Pharmacy has been consulted for Levaquin dosing.  Plan: Change Levaquin to 750 mg po daily *F/U cultures and clinical progress *Monitor V/S and labs  Height: 5\' 3"  (160 cm) Weight: 195 lb 8.8 oz (88.7 kg) IBW/kg (Calculated) : 52.4  Temp (24hrs), Avg:97.8 F (36.6 C), Min:97.6 F (36.4 C), Max:98 F (36.7 C)   Recent Labs Lab 09/01/16 1536 09/01/16 1544 09/01/16 2029 09/01/16 2247 09/02/16 0406 09/02/16 1008 09/03/16 0451 09/03/16 1025  WBC 12.9*  --   --   --  6.0  --   --   --   CREATININE 0.61  --   --   --  0.64  --  0.65  --   LATICACIDVEN  --  1.31 2.5* 2.3*  --  2.0*  --  2.4*    Estimated Creatinine Clearance: 80 mL/min (by C-G formula based on SCr of 0.65 mg/dL).    Allergies  Allergen Reactions  . Gabapentin Shortness Of Breath and Rash  . Mobic [Meloxicam] Anaphylaxis  . Penicillins Shortness Of Breath and Swelling    Has patient had a PCN reaction causing immediate rash, facial/tongue/throat swelling, SOB or lightheadedness with hypotension: unknown Has patient had a PCN reaction causing severe rash involving mucus membranes or skin necrosis: {unknown Has patient had a PCN reaction that required hospitalization {unknown Has patient had a PCN reaction occurring within the last 10 years: no If all of the above answers are "NO", then may proceed with Cephalosporin use.  . Sulfa Antibiotics Shortness Of Breath and Swelling    Antimicrobials this admission: Vancomycin 1/3 >>1/4  Zosyn 1/3 >>   Dose adjustments this admission: n/a  Microbiology results: 1/3 BCx: 1/2 gpr, BCID was negative 1/3 UCx: ng final  Thanks for allowing pharmacy to be a part of this patient's care.  Talbert CageLora Zayne Draheim, PharmD Clinical Pharmacist  09/04/2016 9:13 AM

## 2016-09-04 NOTE — Progress Notes (Signed)
PROGRESS NOTE    Bethany Villa  WUJ:[id number] DOB: [birth year] DOA: 09/01/2016 PCP: Bennie Pierini, FNP   Brief Narrative: Bethany Villa is a 61 y.o. female with medical history significant of COPD previously on home O2 with h/o intubation presents with worsenign cough, associated with sob, . Was admitted to hospitalist service for acute respiratory failure with hypoxia and hypercapnia and respiratory acidosis. She required BIPAP, and admitted to step down for further evaluation.  Pulmonary consulted and recommendations given.   Assessment & Plan:   Principal Problem:   Acute respiratory failure with hypoxia and hypercapnia (HCC) Active Problems:   Hyperglycemia   CAP (community acquired pneumonia)   Sepsis (HCC)   COPD exacerbation (HCC)   Elevated brain natriuretic peptide (BNP) level   LFT elevation   Acute respiratory failure with acidosis, with hypoxia and hypercapnia: Admitted to step down was on BIPAP, weaned her off bipap to nasal [place] oxygen ,on 3 lit of [place] oxygen. Probably secondary to CAP, copd exacerbation and probably chf exacerbation.  Started on Levaquin, blood cultures ordered and negative. Influenza PCR is negative.  Started on IV steroids, duonebs and mucinex. Transition to po prednisone in am if wheezing is better.  Elevated BNP, CXR shows bilateral infiltrates. Repeat CXR shows improving infiltrates.  Echocardiogram ordered to evaluate for CHF shows diastolic dysfunction.  Negative troponins.  Wean her off the oxygen as appropriate.     Elevated lactic acid: probably from CAP.  Trend lactic acid. Still elevated,. Repeat in am.    Elevated liver function tests: resolved.    DVT prophylaxis: (Lovenox) Code Status: (Full) Family Communication: none at bedside.  Disposition Plan: possible d/c in 1 to 2 week.    Consultants:   Pulmonary consult.    Procedures: echocardiogram.   Antimicrobials: levaquin 1/3   Subjective: Breathing has  improved. Recommend ambulate in the hallway.   Objective: Vitals:   09/04/16 0812 09/04/16 1126 09/04/16 1510 09/04/16 1921  BP:   131/86   Pulse:   98 (!) 113  Resp:   20 18  Temp:   97.9 F (36.6 C)   TempSrc:   Oral   SpO2: 94% 94% 96% 95%  Weight:      Height:        Intake/Output Summary (Last 24 hours) at 09/04/16 1922 Last data filed at 09/04/16 1741  Gross per 24 hour  Intake              720 ml  Output                0 ml  Net              720 ml   Filed Weights   09/01/16 1600 09/02/16 0615 09/03/16 0525  Weight: 84.8 kg (187 lb) 88.4 kg (194 lb 14.2 oz) 88.7 kg (195 lb 8.8 oz)    Examination:  General exam: comfortable, off BIPAP. On 4 lit of [place] oxygen.  Respiratory system: basilar rales.  Cardiovascular system: S1 & S2 heard, RRR. No JVD, murmurs, rubs, gallops or clicks. No pedal edema. Gastrointestinal system: Abdomen is nondistended, soft and nontender. No organomegaly or masses felt. Normal bowel sounds heard. Central nervous system: Alert and oriented. No focal neurological deficits. Extremities: Symmetric 5 x 5 power. Skin: No rashes, lesions or ulcers Psychiatry: Judgement and insight appear normal. Mood & affect appropriate.     Data Reviewed: I have personally reviewed following labs and imaging studies  CBC:  Recent  Labs Lab 09/01/16 1536 09/02/16 0406  WBC 12.9* 6.0  NEUTROABS 7.5 4.1  HGB 16.4* 13.9  HCT 52.6* 44.0  MCV 92.4 95.0  PLT 153 110*   Basic Metabolic Panel:  Recent Labs Lab 09/01/16 1536 09/02/16 0406 09/03/16 0451  NA 136 137 141  K 3.6 3.6 3.6  CL 99* 105 105  CO2 28 28 29   GLUCOSE 128* 164* 126*  BUN 9 10 17   CREATININE 0.61 0.64 0.65  CALCIUM 8.6* 8.2* 8.2*   GFR: Estimated Creatinine Clearance: 80 mL/min (by C-G formula based on SCr of 0.65 mg/dL). Liver Function Tests:  Recent Labs Lab 09/01/16 1536 09/02/16 0406  AST 53* 32  ALT 69* 47  ALKPHOS 112 80  BILITOT 0.8 0.8  PROT 7.7 5.9*  ALBUMIN  3.6 2.7*   No results for input(s): LIPASE, AMYLASE in the last 168 hours. No results for input(s): AMMONIA in the last 168 hours. Coagulation Profile:  Recent Labs Lab 09/01/16 2029  INR 1.01   Cardiac Enzymes:  Recent Labs Lab 09/01/16 1536  TROPONINI <0.03   BNP (last 3 results) No results for input(s): PROBNP in the last 8760 hours. HbA1C: No results for input(s): HGBA1C in the last 72 hours. CBG: No results for input(s): GLUCAP in the last 168 hours. Lipid Profile: No results for input(s): CHOL, HDL, LDLCALC, TRIG, CHOLHDL, LDLDIRECT in the last 72 hours. Thyroid Function Tests: No results for input(s): TSH, T4TOTAL, FREET4, T3FREE, THYROIDAB in the last 72 hours. Anemia Panel: No results for input(s): VITAMINB12, FOLATE, FERRITIN, TIBC, IRON, RETICCTPCT in the last 72 hours. Sepsis Labs:  Recent Labs Lab 09/01/16 2029 09/01/16 2247 09/02/16 1008 09/03/16 1025  PROCALCITON <0.10  --   --   --   LATICACIDVEN 2.5* 2.3* 2.0* 2.4*    Recent Results (from the past 240 hour(s))  Culture, blood (Routine X 2) w Reflex to ID Panel     Status: None (Preliminary result)   Collection Time: 09/01/16  3:37 PM  Result Value Ref Range Status   Specimen Description BLOOD RIGHT FOREARM  Final   Special Requests BOTTLES DRAWN AEROBIC AND ANAEROBIC 6CC  Final   Culture NO GROWTH 2 DAYS  Final   Report Status PENDING  Incomplete  Culture, blood (Routine X 2) w Reflex to ID Panel     Status: None (Preliminary result)   Collection Time: 09/01/16  3:43 PM  Result Value Ref Range Status   Specimen Description LEFT ANTECUBITAL  Final   Special Requests BOTTLES DRAWN AEROBIC AND ANAEROBIC 6CC  Final   Culture  Setup Time   Final    GRAM POSITIVE RODS AEROBIC BOTTLE ONLY Gram Stain Report Called to,Read Back By and Verified With: PHILLIPS,C AT 0610 BY HUFFINES,S ON 09/03/16. CRITICAL RESULT CALLED TO, READ BACK BY AND VERIFIED WITH: L SEAY,PHARMD AT 1440 09/03/16 BY L BENFIELD     Culture   Final    GRAM POSITIVE RODS CULTURE REINCUBATED FOR BETTER GROWTH Performed at [facility]    Report Status PENDING  Incomplete  Blood Culture ID Panel (Reflexed)     Status: None   Collection Time: 09/01/16  3:43 PM  Result Value Ref Range Status   Enterococcus species NOT DETECTED NOT DETECTED Final   Listeria monocytogenes NOT DETECTED NOT DETECTED Final   Staphylococcus species NOT DETECTED NOT DETECTED Final   Staphylococcus aureus NOT DETECTED NOT DETECTED Final   Streptococcus species NOT DETECTED NOT DETECTED Final   Streptococcus agalactiae NOT  DETECTED NOT DETECTED Final   Streptococcus pneumoniae NOT DETECTED NOT DETECTED Final   Streptococcus pyogenes NOT DETECTED NOT DETECTED Final   Acinetobacter baumannii NOT DETECTED NOT DETECTED Final   Enterobacteriaceae species NOT DETECTED NOT DETECTED Final   Enterobacter cloacae complex NOT DETECTED NOT DETECTED Final   Escherichia coli NOT DETECTED NOT DETECTED Final   Klebsiella oxytoca NOT DETECTED NOT DETECTED Final   Klebsiella pneumoniae NOT DETECTED NOT DETECTED Final   Proteus species NOT DETECTED NOT DETECTED Final   Serratia marcescens NOT DETECTED NOT DETECTED Final   Haemophilus influenzae NOT DETECTED NOT DETECTED Final   Neisseria meningitidis NOT DETECTED NOT DETECTED Final   Pseudomonas aeruginosa NOT DETECTED NOT DETECTED Final   Candida albicans NOT DETECTED NOT DETECTED Final   Candida glabrata NOT DETECTED NOT DETECTED Final   Candida krusei NOT DETECTED NOT DETECTED Final   Candida parapsilosis NOT DETECTED NOT DETECTED Final   Candida tropicalis NOT DETECTED NOT DETECTED Final    Comment: Performed at [facility]  MRSA PCR Screening     Status: None   Collection Time: 09/01/16  8:05 PM  Result Value Ref Range Status   MRSA by PCR NEGATIVE NEGATIVE Final    Comment:        The GeneXpert MRSA Assay (FDA approved for NASAL specimens only), is one component of a comprehensive  MRSA colonization surveillance program. It is not intended to diagnose MRSA infection nor to guide or monitor treatment for MRSA infections.   Urine culture     Status: None   Collection Time: 09/01/16 10:30 PM  Result Value Ref Range Status   Specimen Description URINE, CLEAN CATCH  Final   Special Requests NONE  Final   Culture NO GROWTH Performed at [facility]    Final   Report Status 09/03/2016 FINAL  Final         Radiology Studies: Dg Chest 2 View  Result Date: 09/03/2016 CLINICAL DATA:  Wheezing, sick with pneumonia since last Sunday, history asthma, COPD, fibromyalgia, smoker EXAM: CHEST  2 VIEW COMPARISON:  09/01/2016 FINDINGS: Normal heart size, mediastinal contours and pulmonary vascularity for technique. Atherosclerotic calcification aortic arch. Minimal bronchitic changes. Improved pulmonary infiltrates since previous exam. No infiltrate, pleural effusion or pneumothorax. Bones demineralized. IMPRESSION: Bronchitic changes with improved pulmonary infiltrates since previous study. No new abnormalities. Aortic atherosclerosis. Electronically Signed   By: Ulyses SouthwardMark  Boles M.D.   On: 09/03/2016 13:38        Scheduled Meds: . aspirin EC  325 mg Oral BID  . atorvastatin  20 mg Oral q1800  . chlorhexidine  15 mL Mouth Rinse BID  . enoxaparin (LOVENOX) injection  40 mg Subcutaneous Q24H  . feeding supplement (PRO-STAT SUGAR FREE 64)  30 mL Oral Daily  . fluticasone  2 spray Each Nare Daily  . ipratropium-albuterol  3 mL Nebulization Q4H  . levofloxacin  750 mg Oral q1800  . mouth rinse  15 mL Mouth Rinse q12n4p  . methocarbamol  500 mg Oral BID  . methylPREDNISolone (SOLU-MEDROL) injection  40 mg Intravenous Q8H  . nicotine  21 mg Transdermal Daily  . pantoprazole  40 mg Oral Daily  . sodium chloride  1,000 mL Intravenous Once   And  . sodium chloride  1,000 mL Intravenous Once  . topiramate  50 mg Oral Daily   Continuous Infusions:    LOS: 3 days     Time spent: 35 minutes.     Kathlen ModyAKULA,Shereka Lafortune, MD [facility]  Pager [phone]   If 7PM-7AM, please contact night-coverage [email or web address] Password [email or web address] 09/04/2016, 7:22 PM

## 2016-09-05 LAB — CULTURE, BLOOD (ROUTINE X 2)

## 2016-09-05 LAB — LACTIC ACID, PLASMA: Lactic Acid, Venous: 1.9 mmol/L (ref 0.5–1.9)

## 2016-09-05 MED ORDER — SALINE SPRAY 0.65 % NA SOLN
1.0000 | NASAL | Status: DC | PRN
  Administered 2016-09-05: 1 via NASAL
  Filled 2016-09-05: qty 44

## 2016-09-05 MED ORDER — FUROSEMIDE 40 MG PO TABS
40.0000 mg | ORAL_TABLET | Freq: Every day | ORAL | Status: DC
  Administered 2016-09-05 – 2016-09-07 (×3): 40 mg via ORAL
  Filled 2016-09-05 (×3): qty 1

## 2016-09-05 NOTE — Progress Notes (Signed)
PROGRESS NOTE    KEMYAH BUSER  NGE:[id number] DOB: [birth year] DOA: 09/01/2016 PCP: Bennie Pierini, FNP   Brief Narrative: Bethany Villa is a 61 y.o. female with medical history significant of COPD previously on home O2 with h/o intubation presents with worsenign cough, associated with sob, . Was admitted to hospitalist service for acute respiratory failure with hypoxia and hypercapnia and respiratory acidosis. She required BIPAP, and admitted to step down for further evaluation.  Pulmonary consulted and recommendations given.   Assessment & Plan:   Principal Problem:   Acute respiratory failure with hypoxia and hypercapnia (HCC) Active Problems:   Hyperglycemia   CAP (community acquired pneumonia)   Sepsis (HCC)   COPD exacerbation (HCC)   Elevated brain natriuretic peptide (BNP) level   LFT elevation   Acute respiratory failure with acidosis, with hypoxia and hypercapnia: Admitted to step down was on BIPAP, weaned her off bipap to nasal [place] oxygen ,on 3 lit of [place] oxygen. Probably secondary to CAP, copd exacerbation and probably chf exacerbation.  Started on Levaquin, blood cultures ordered and negative. Influenza PCR is negative.  Started on IV steroids, duonebs and mucinex. Transitioned to po prednisone as wheezing has improved.  Elevated BNP, CXR shows bilateral infiltrates. Repeat CXR shows improving infiltrates.  Echocardiogram ordered to evaluate for CHF shows diastolic dysfunction.  Negative troponins.  Wean her off the oxygen as appropriate.  PT evaluation     Elevated lactic acid: probably from CAP.  Trend lactic acid. Still elevated,. Repeat in am shows normal Lactic acid.    Elevated liver function tests: resolved.    DVT prophylaxis: (Lovenox) Code Status: (Full) Family Communication: none at bedside.  Disposition Plan: possible d/c in 1 to 2 week.    Consultants:   Pulmonary consult.    Procedures: echocardiogram.   Antimicrobials:  levaquin 1/3   Subjective: Breathing has improved. Recommend ambulate in the hallway.   Objective: Vitals:   09/05/16 0026 09/05/16 0631 09/05/16 0814 09/05/16 1146  BP:  137/68    Pulse:  (!) 104    Resp:  18    Temp:  98 F (36.7 C)    TempSrc:  Oral    SpO2: 95% 95% 94% 98%  Weight:      Height:        Intake/Output Summary (Last 24 hours) at 09/05/16 1354 Last data filed at 09/05/16 0905  Gross per 24 hour  Intake              720 ml  Output                0 ml  Net              720 ml   Filed Weights   09/01/16 1600 09/02/16 0615 09/03/16 0525  Weight: 84.8 kg (187 lb) 88.4 kg (194 lb 14.2 oz) 88.7 kg (195 lb 8.8 oz)    Examination:  General exam: comfortable, off BIPAP. On 4 lit of [place] oxygen.  Respiratory system: basilar rales.  Cardiovascular system: S1 & S2 heard, RRR. No JVD, murmurs, rubs, gallops or clicks. No pedal edema. Gastrointestinal system: Abdomen is nondistended, soft and nontender. No organomegaly or masses felt. Normal bowel sounds heard. Central nervous system: Alert and oriented. No focal neurological deficits. Extremities: Symmetric 5 x 5 power. Skin: No rashes, lesions or ulcers Psychiatry: Judgement and insight appear normal. Mood & affect appropriate.     Data Reviewed: I have personally reviewed following labs and imaging  studies  CBC:  Recent Labs Lab 09/01/16 1536 09/02/16 0406  WBC 12.9* 6.0  NEUTROABS 7.5 4.1  HGB 16.4* 13.9  HCT 52.6* 44.0  MCV 92.4 95.0  PLT 153 110*   Basic Metabolic Panel:  Recent Labs Lab 09/01/16 1536 09/02/16 0406 09/03/16 0451  NA 136 137 141  K 3.6 3.6 3.6  CL 99* 105 105  CO2 28 28 29   GLUCOSE 128* 164* 126*  BUN 9 10 17   CREATININE 0.61 0.64 0.65  CALCIUM 8.6* 8.2* 8.2*   GFR: Estimated Creatinine Clearance: 80 mL/min (by C-G formula based on SCr of 0.65 mg/dL). Liver Function Tests:  Recent Labs Lab 09/01/16 1536 09/02/16 0406  AST 53* 32  ALT 69* 47  ALKPHOS 112 80    BILITOT 0.8 0.8  PROT 7.7 5.9*  ALBUMIN 3.6 2.7*   No results for input(s): LIPASE, AMYLASE in the last 168 hours. No results for input(s): AMMONIA in the last 168 hours. Coagulation Profile:  Recent Labs Lab 09/01/16 2029  INR 1.01   Cardiac Enzymes:  Recent Labs Lab 09/01/16 1536  TROPONINI <0.03   BNP (last 3 results) No results for input(s): PROBNP in the last 8760 hours. HbA1C: No results for input(s): HGBA1C in the last 72 hours. CBG: No results for input(s): GLUCAP in the last 168 hours. Lipid Profile: No results for input(s): CHOL, HDL, LDLCALC, TRIG, CHOLHDL, LDLDIRECT in the last 72 hours. Thyroid Function Tests: No results for input(s): TSH, T4TOTAL, FREET4, T3FREE, THYROIDAB in the last 72 hours. Anemia Panel: No results for input(s): VITAMINB12, FOLATE, FERRITIN, TIBC, IRON, RETICCTPCT in the last 72 hours. Sepsis Labs:  Recent Labs Lab 09/01/16 2029 09/01/16 2247 09/02/16 1008 09/03/16 1025 09/05/16 1116  PROCALCITON <0.10  --   --   --   --   LATICACIDVEN 2.5* 2.3* 2.0* 2.4* 1.9    Recent Results (from the past 240 hour(s))  Culture, blood (Routine X 2) w Reflex to ID Panel     Status: None (Preliminary result)   Collection Time: 09/01/16  3:37 PM  Result Value Ref Range Status   Specimen Description BLOOD RIGHT FOREARM  Final   Special Requests BOTTLES DRAWN AEROBIC AND ANAEROBIC 6CC  Final   Culture NO GROWTH 4 DAYS  Final   Report Status PENDING  Incomplete  Culture, blood (Routine X 2) w Reflex to ID Panel     Status: Abnormal   Collection Time: 09/01/16  3:43 PM  Result Value Ref Range Status   Specimen Description LEFT ANTECUBITAL  Final   Special Requests BOTTLES DRAWN AEROBIC AND ANAEROBIC 6CC  Final   Culture  Setup Time   Final    GRAM POSITIVE RODS AEROBIC BOTTLE ONLY Gram Stain Report Called to,Read Back By and Verified With: PHILLIPS,C AT 0610 BY HUFFINES,S ON 09/03/16. CRITICAL RESULT CALLED TO, READ BACK BY AND VERIFIED  WITH: L SEAY,PHARMD AT 1440 09/03/16 BY L BENFIELD    Culture (A)  Final    DIPHTHEROIDS(CORYNEBACTERIUM SPECIES) Standardized susceptibility testing for this organism is not available. Performed at [facility]    Report Status 09/05/2016 FINAL  Final  Blood Culture ID Panel (Reflexed)     Status: None   Collection Time: 09/01/16  3:43 PM  Result Value Ref Range Status   Enterococcus species NOT DETECTED NOT DETECTED Final   Listeria monocytogenes NOT DETECTED NOT DETECTED Final   Staphylococcus species NOT DETECTED NOT DETECTED Final   Staphylococcus aureus NOT DETECTED NOT DETECTED Final  Streptococcus species NOT DETECTED NOT DETECTED Final   Streptococcus agalactiae NOT DETECTED NOT DETECTED Final   Streptococcus pneumoniae NOT DETECTED NOT DETECTED Final   Streptococcus pyogenes NOT DETECTED NOT DETECTED Final   Acinetobacter baumannii NOT DETECTED NOT DETECTED Final   Enterobacteriaceae species NOT DETECTED NOT DETECTED Final   Enterobacter cloacae complex NOT DETECTED NOT DETECTED Final   Escherichia coli NOT DETECTED NOT DETECTED Final   Klebsiella oxytoca NOT DETECTED NOT DETECTED Final   Klebsiella pneumoniae NOT DETECTED NOT DETECTED Final   Proteus species NOT DETECTED NOT DETECTED Final   Serratia marcescens NOT DETECTED NOT DETECTED Final   Haemophilus influenzae NOT DETECTED NOT DETECTED Final   Neisseria meningitidis NOT DETECTED NOT DETECTED Final   Pseudomonas aeruginosa NOT DETECTED NOT DETECTED Final   Candida albicans NOT DETECTED NOT DETECTED Final   Candida glabrata NOT DETECTED NOT DETECTED Final   Candida krusei NOT DETECTED NOT DETECTED Final   Candida parapsilosis NOT DETECTED NOT DETECTED Final   Candida tropicalis NOT DETECTED NOT DETECTED Final    Comment: Performed at [facility]  MRSA PCR Screening     Status: None   Collection Time: 09/01/16  8:05 PM  Result Value Ref Range Status   MRSA by PCR NEGATIVE NEGATIVE Final    Comment:         The GeneXpert MRSA Assay (FDA approved for NASAL specimens only), is one component of a comprehensive MRSA colonization surveillance program. It is not intended to diagnose MRSA infection nor to guide or monitor treatment for MRSA infections.   Urine culture     Status: None   Collection Time: 09/01/16 10:30 PM  Result Value Ref Range Status   Specimen Description URINE, CLEAN CATCH  Final   Special Requests NONE  Final   Culture NO GROWTH Performed at [facility]   Final   Report Status 09/03/2016 FINAL  Final         Radiology Studies: No results found.      Scheduled Meds: . aspirin EC  325 mg Oral BID  . atorvastatin  20 mg Oral q1800  . chlorhexidine  15 mL Mouth Rinse BID  . enoxaparin (LOVENOX) injection  40 mg Subcutaneous Q24H  . feeding supplement (PRO-STAT SUGAR FREE 64)  30 mL Oral Daily  . fluticasone  2 spray Each Nare Daily  . ipratropium-albuterol  3 mL Nebulization Q4H  . levofloxacin  750 mg Oral q1800  . mouth rinse  15 mL Mouth Rinse q12n4p  . methocarbamol  500 mg Oral BID  . methylPREDNISolone (SOLU-MEDROL) injection  40 mg Intravenous Q8H  . nicotine  21 mg Transdermal Daily  . pantoprazole  40 mg Oral Daily  . sodium chloride  1,000 mL Intravenous Once   And  . sodium chloride  1,000 mL Intravenous Once  . topiramate  50 mg Oral Daily   Continuous Infusions:    LOS: 4 days    Time spent: 35 minutes.     Kathlen Mody, MD [facility] Pager [phone]   If 7PM-7AM, please contact night-coverage [email or web address] Password [email or web address] 09/05/2016, 1:54 PM

## 2016-09-06 LAB — CULTURE, BLOOD (ROUTINE X 2): CULTURE: NO GROWTH

## 2016-09-06 MED ORDER — IPRATROPIUM-ALBUTEROL 0.5-2.5 (3) MG/3ML IN SOLN
3.0000 mL | RESPIRATORY_TRACT | Status: DC
  Administered 2016-09-06 (×4): 3 mL via RESPIRATORY_TRACT
  Filled 2016-09-06 (×4): qty 3

## 2016-09-06 MED ORDER — IPRATROPIUM-ALBUTEROL 0.5-2.5 (3) MG/3ML IN SOLN
3.0000 mL | Freq: Three times a day (TID) | RESPIRATORY_TRACT | Status: DC
  Administered 2016-09-07: 3 mL via RESPIRATORY_TRACT
  Filled 2016-09-06: qty 3

## 2016-09-06 NOTE — Progress Notes (Signed)
OT Cancellation Note  Patient Details Name: Basilio Cairoenny E Patient MRN: [id number] DOB: 12/24/1955   Cancelled Treatment:    Reason Eval/Treat Not Completed: OT screened, no needs identified, will sign off   Limmie PatriciaLaura Winnie Barsky, OTR/L,CBIS  [phone]  09/06/2016, 10:22 AM

## 2016-09-06 NOTE — Progress Notes (Signed)
PROGRESS NOTE    Bethany Villa  ZOX:[id number] DOB: 1956-07-01 DOA: 09/01/2016 PCP: Bennie Pierini, FNP   Brief Narrative: Bethany Villa is a 61 y.o. female with medical history significant of COPD previously on home O2 with h/o intubation presents with worsenign cough, associated with sob, . Was admitted to hospitalist service for acute respiratory failure with hypoxia and hypercapnia and respiratory acidosis. She required BIPAP, and admitted to step down for further evaluation.  Pulmonary consulted and recommendations given.   Assessment & Plan:   Principal Problem:   Acute respiratory failure with hypoxia and hypercapnia (HCC) Active Problems:   Hyperglycemia   CAP (community acquired pneumonia)   Sepsis (HCC)   COPD exacerbation (HCC)   Elevated brain natriuretic peptide (BNP) level   LFT elevation   Acute respiratory failure with acidosis, with hypoxia and hypercapnia: Admitted to step down was on BIPAP, weaned her off bipap to nasal [place] oxygen ,on 3 lit of [place] oxygen. Probably secondary to CAP, copd exacerbation and probably chf exacerbation.  Started on Levaquin, blood cultures ordered and negative. Influenza PCR is negative.  Started on IV steroids, duonebs and mucinex. Transitioned to po prednisone as wheezing has improved. Taper prednisone on discharge.  Elevated BNP, CXR shows bilateral infiltrates. Repeat CXR shows improving infiltrates.  Echocardiogram ordered to evaluate for CHF shows diastolic dysfunction. Started her on lasix and will probably need a low dose on discharge, discussed with the patient Negative troponins.  Wean her off the oxygen as appropriate.  PT evaluation     Elevated lactic acid: probably from CAP.  Trend lactic acid. Still elevated,. Repeat in am shows normal Lactic acid.    Elevated liver function tests: resolved.    DVT prophylaxis: (Lovenox) Code Status: (Full) Family Communication: none at bedside.  Disposition Plan:  possible d/c tomorrow.    Consultants:   Pulmonary consult.    Procedures: echocardiogram.   Antimicrobials: levaquin 1/3   Subjective: Recommended to ambulate.   Objective: Vitals:   09/05/16 2127 09/06/16 0528 09/06/16 0751 09/06/16 1209  BP: 126/72 139/85    Pulse: 100 68    Resp: (!) 21 18    Temp: 97.9 F (36.6 C) 98.2 F (36.8 C)    TempSrc: Oral Oral    SpO2: 95% 96% 98% 95%  Weight:      Height:        Intake/Output Summary (Last 24 hours) at 09/06/16 1321 Last data filed at 09/06/16 0900  Gross per 24 hour  Intake              480 ml  Output                0 ml  Net              480 ml   Filed Weights   09/01/16 1600 09/02/16 0615 09/03/16 0525  Weight: 84.8 kg (187 lb) 88.4 kg (194 lb 14.2 oz) 88.7 kg (195 lb 8.8 oz)    Examination:  General exam: comfortable, off BIPAP. On 3 lit of [place] oxygen.  Respiratory system: basilar rales.scattered wheezing. Air entry fair.   Cardiovascular system: S1 & S2 heard, RRR. No JVD, murmurs, rubs, gallops or clicks. No pedal edema. Gastrointestinal system: Abdomen is nondistended, soft and nontender. No organomegaly or masses felt. Normal bowel sounds heard. Central nervous system: Alert and oriented. No focal neurological deficits. Extremities: Symmetric 5 x 5 power. Skin: No rashes, lesions or ulcers Psychiatry: Judgement and insight appear  normal. Mood & affect appropriate.     Data Reviewed: I have personally reviewed following labs and imaging studies  CBC:  Recent Labs Lab 09/01/16 1536 09/02/16 0406  WBC 12.9* 6.0  NEUTROABS 7.5 4.1  HGB 16.4* 13.9  HCT 52.6* 44.0  MCV 92.4 95.0  PLT 153 110*   Basic Metabolic Panel:  Recent Labs Lab 09/01/16 1536 09/02/16 0406 09/03/16 0451  NA 136 137 141  K 3.6 3.6 3.6  CL 99* 105 105  CO2 28 28 29   GLUCOSE 128* 164* 126*  BUN 9 10 17   CREATININE 0.61 0.64 0.65  CALCIUM 8.6* 8.2* 8.2*   GFR: Estimated Creatinine Clearance: 80 mL/min (by C-G  formula based on SCr of 0.65 mg/dL). Liver Function Tests:  Recent Labs Lab 09/01/16 1536 09/02/16 0406  AST 53* 32  ALT 69* 47  ALKPHOS 112 80  BILITOT 0.8 0.8  PROT 7.7 5.9*  ALBUMIN 3.6 2.7*   No results for input(s): LIPASE, AMYLASE in the last 168 hours. No results for input(s): AMMONIA in the last 168 hours. Coagulation Profile:  Recent Labs Lab 09/01/16 2029  INR 1.01   Cardiac Enzymes:  Recent Labs Lab 09/01/16 1536  TROPONINI <0.03   BNP (last 3 results) No results for input(s): PROBNP in the last 8760 hours. HbA1C: No results for input(s): HGBA1C in the last 72 hours. CBG: No results for input(s): GLUCAP in the last 168 hours. Lipid Profile: No results for input(s): CHOL, HDL, LDLCALC, TRIG, CHOLHDL, LDLDIRECT in the last 72 hours. Thyroid Function Tests: No results for input(s): TSH, T4TOTAL, FREET4, T3FREE, THYROIDAB in the last 72 hours. Anemia Panel: No results for input(s): VITAMINB12, FOLATE, FERRITIN, TIBC, IRON, RETICCTPCT in the last 72 hours. Sepsis Labs:  Recent Labs Lab 09/01/16 2029 09/01/16 2247 09/02/16 1008 09/03/16 1025 09/05/16 1116  PROCALCITON <0.10  --   --   --   --   LATICACIDVEN 2.5* 2.3* 2.0* 2.4* 1.9    Recent Results (from the past 240 hour(s))  Culture, blood (Routine X 2) w Reflex to ID Panel     Status: None   Collection Time: 09/01/16  3:37 PM  Result Value Ref Range Status   Specimen Description BLOOD RIGHT FOREARM  Final   Special Requests BOTTLES DRAWN AEROBIC AND ANAEROBIC 6CC  Final   Culture NO GROWTH 5 DAYS  Final   Report Status 09/06/2016 FINAL  Final  Culture, blood (Routine X 2) w Reflex to ID Panel     Status: Abnormal   Collection Time: 09/01/16  3:43 PM  Result Value Ref Range Status   Specimen Description LEFT ANTECUBITAL  Final   Special Requests BOTTLES DRAWN AEROBIC AND ANAEROBIC 6CC  Final   Culture  Setup Time   Final    GRAM POSITIVE RODS AEROBIC BOTTLE ONLY Gram Stain Report Called  to,Read Back By and Verified With: PHILLIPS,C AT 0610 BY HUFFINES,S ON 09/03/16. CRITICAL RESULT CALLED TO, READ BACK BY AND VERIFIED WITH: L SEAY,PHARMD AT 1440 09/03/16 BY L BENFIELD    Culture (A)  Final    DIPHTHEROIDS(CORYNEBACTERIUM SPECIES) Standardized susceptibility testing for this organism is not available. Performed at [facility]    Report Status 09/05/2016 FINAL  Final  Blood Culture ID Panel (Reflexed)     Status: None   Collection Time: 09/01/16  3:43 PM  Result Value Ref Range Status   Enterococcus species NOT DETECTED NOT DETECTED Final   Listeria monocytogenes NOT DETECTED NOT DETECTED Final  Staphylococcus species NOT DETECTED NOT DETECTED Final   Staphylococcus aureus NOT DETECTED NOT DETECTED Final   Streptococcus species NOT DETECTED NOT DETECTED Final   Streptococcus agalactiae NOT DETECTED NOT DETECTED Final   Streptococcus pneumoniae NOT DETECTED NOT DETECTED Final   Streptococcus pyogenes NOT DETECTED NOT DETECTED Final   Acinetobacter baumannii NOT DETECTED NOT DETECTED Final   Enterobacteriaceae species NOT DETECTED NOT DETECTED Final   Enterobacter cloacae complex NOT DETECTED NOT DETECTED Final   Escherichia coli NOT DETECTED NOT DETECTED Final   Klebsiella oxytoca NOT DETECTED NOT DETECTED Final   Klebsiella pneumoniae NOT DETECTED NOT DETECTED Final   Proteus species NOT DETECTED NOT DETECTED Final   Serratia marcescens NOT DETECTED NOT DETECTED Final   Haemophilus influenzae NOT DETECTED NOT DETECTED Final   Neisseria meningitidis NOT DETECTED NOT DETECTED Final   Pseudomonas aeruginosa NOT DETECTED NOT DETECTED Final   Candida albicans NOT DETECTED NOT DETECTED Final   Candida glabrata NOT DETECTED NOT DETECTED Final   Candida krusei NOT DETECTED NOT DETECTED Final   Candida parapsilosis NOT DETECTED NOT DETECTED Final   Candida tropicalis NOT DETECTED NOT DETECTED Final    Comment: Performed at [facility]  MRSA PCR Screening      Status: None   Collection Time: 09/01/16  8:05 PM  Result Value Ref Range Status   MRSA by PCR NEGATIVE NEGATIVE Final    Comment:        The GeneXpert MRSA Assay (FDA approved for NASAL specimens only), is one component of a comprehensive MRSA colonization surveillance program. It is not intended to diagnose MRSA infection nor to guide or monitor treatment for MRSA infections.   Urine culture     Status: None   Collection Time: 09/01/16 10:30 PM  Result Value Ref Range Status   Specimen Description URINE, CLEAN CATCH  Final   Special Requests NONE  Final   Culture NO GROWTH Performed at [facility]   Final   Report Status 09/03/2016 FINAL  Final         Radiology Studies: No results found.      Scheduled Meds: . aspirin EC  325 mg Oral BID  . atorvastatin  20 mg Oral q1800  . enoxaparin (LOVENOX) injection  40 mg Subcutaneous Q24H  . feeding supplement (PRO-STAT SUGAR FREE 64)  30 mL Oral Daily  . fluticasone  2 spray Each Nare Daily  . furosemide  40 mg Oral Daily  . ipratropium-albuterol  3 mL Nebulization Q4H WA  . levofloxacin  750 mg Oral q1800  . mouth rinse  15 mL Mouth Rinse q12n4p  . methocarbamol  500 mg Oral BID  . methylPREDNISolone (SOLU-MEDROL) injection  40 mg Intravenous Q8H  . nicotine  21 mg Transdermal Daily  . pantoprazole  40 mg Oral Daily  . sodium chloride  1,000 mL Intravenous Once   And  . sodium chloride  1,000 mL Intravenous Once  . topiramate  50 mg Oral Daily   Continuous Infusions:    LOS: 5 days    Time spent: 35 minutes.     Kathlen ModyAKULA,Ane Conerly, MD [facility] Pager [phone]   If 7PM-7AM, please contact night-coverage [email or web address] Password [email or web address] 09/06/2016, 1:21 PM

## 2016-09-06 NOTE — Progress Notes (Signed)
Subjective: She says she feels better. Her concern is that she is still on oxygen. Otherwise she is pretty much back to baseline. No nausea vomiting diarrhea. No chest pain. No significant sputum production.  Objective: Vital signs in last 24 hours: Temp:  [97.9 F (36.6 C)-98.2 F (36.8 C)] 98.2 F (36.8 C) (01/08 0528) Pulse Rate:  [68-100] 68 (01/08 0528) Resp:  [18-21] 18 (01/08 0528) BP: (126-139)/(72-85) 139/85 (01/08 0528) SpO2:  [95 %-98 %] 98 % (01/08 0751) Weight change:  Last BM Date: 09/04/16  Intake/Output from previous day: 01/07 0701 - 01/08 0700 In: 480 [P.O.:480] Out: -   PHYSICAL EXAM General appearance: alert, cooperative and no distress Resp: She is moving air much better. No wheezing. Cardio: regular rate and rhythm, S1, S2 normal, no murmur, click, rub or gallop GI: soft, non-tender; bowel sounds normal; no masses,  no organomegaly Extremities: extremities normal, atraumatic, no cyanosis or edema Skin warm and dry. Mucous membranes are moist  Lab Results:  Results for orders placed or performed during the hospital encounter of 09/01/16 (from the past 48 hour(s))  Lactic acid, plasma     Status: None   Collection Time: 09/05/16 11:16 AM  Result Value Ref Range   Lactic Acid, Venous 1.9 0.5 - 1.9 mmol/L    ABGS No results for input(s): PHART, PO2ART, TCO2, HCO3 in the last 72 hours.  Invalid input(s): PCO2 CULTURES Recent Results (from the past 240 hour(s))  Culture, blood (Routine X 2) w Reflex to ID Panel     Status: None   Collection Time: 09/01/16  3:37 PM  Result Value Ref Range Status   Specimen Description BLOOD RIGHT FOREARM  Final   Special Requests BOTTLES DRAWN AEROBIC AND ANAEROBIC 6CC  Final   Culture NO GROWTH 5 DAYS  Final   Report Status 09/06/2016 FINAL  Final  Culture, blood (Routine X 2) w Reflex to ID Panel     Status: Abnormal   Collection Time: 09/01/16  3:43 PM  Result Value Ref Range Status   Specimen Description LEFT  ANTECUBITAL  Final   Special Requests BOTTLES DRAWN AEROBIC AND ANAEROBIC 6CC  Final   Culture  Setup Time   Final    GRAM POSITIVE RODS AEROBIC BOTTLE ONLY Gram Stain Report Called to,Read Back By and Verified With: PHILLIPS,C AT 0610 BY HUFFINES,S ON 09/03/16. CRITICAL RESULT CALLED TO, READ BACK BY AND VERIFIED WITH: L SEAY,PHARMD AT 1440 09/03/16 BY L BENFIELD    Culture (A)  Final    DIPHTHEROIDS(CORYNEBACTERIUM SPECIES) Standardized susceptibility testing for this organism is not available. Performed at [facility]    Report Status 09/05/2016 FINAL  Final  Blood Culture ID Panel (Reflexed)     Status: None   Collection Time: 09/01/16  3:43 PM  Result Value Ref Range Status   Enterococcus species NOT DETECTED NOT DETECTED Final   Listeria monocytogenes NOT DETECTED NOT DETECTED Final   Staphylococcus species NOT DETECTED NOT DETECTED Final   Staphylococcus aureus NOT DETECTED NOT DETECTED Final   Streptococcus species NOT DETECTED NOT DETECTED Final   Streptococcus agalactiae NOT DETECTED NOT DETECTED Final   Streptococcus pneumoniae NOT DETECTED NOT DETECTED Final   Streptococcus pyogenes NOT DETECTED NOT DETECTED Final   Acinetobacter baumannii NOT DETECTED NOT DETECTED Final   Enterobacteriaceae species NOT DETECTED NOT DETECTED Final   Enterobacter cloacae complex NOT DETECTED NOT DETECTED Final   Escherichia coli NOT DETECTED NOT DETECTED Final   Klebsiella oxytoca NOT DETECTED NOT  DETECTED Final   Klebsiella pneumoniae NOT DETECTED NOT DETECTED Final   Proteus species NOT DETECTED NOT DETECTED Final   Serratia marcescens NOT DETECTED NOT DETECTED Final   Haemophilus influenzae NOT DETECTED NOT DETECTED Final   Neisseria meningitidis NOT DETECTED NOT DETECTED Final   Pseudomonas aeruginosa NOT DETECTED NOT DETECTED Final   Candida albicans NOT DETECTED NOT DETECTED Final   Candida glabrata NOT DETECTED NOT DETECTED Final   Candida krusei NOT DETECTED NOT DETECTED  Final   Candida parapsilosis NOT DETECTED NOT DETECTED Final   Candida tropicalis NOT DETECTED NOT DETECTED Final    Comment: Performed at [facility]  MRSA PCR Screening     Status: None   Collection Time: 09/01/16  8:05 PM  Result Value Ref Range Status   MRSA by PCR NEGATIVE NEGATIVE Final    Comment:        The GeneXpert MRSA Assay (FDA approved for NASAL specimens only), is one component of a comprehensive MRSA colonization surveillance program. It is not intended to diagnose MRSA infection nor to guide or monitor treatment for MRSA infections.   Urine culture     Status: None   Collection Time: 09/01/16 10:30 PM  Result Value Ref Range Status   Specimen Description URINE, CLEAN CATCH  Final   Special Requests NONE  Final   Culture NO GROWTH Performed at [facility]   Final   Report Status 09/03/2016 FINAL  Final   Studies/Results: No results found.  Medications:  Prior to Admission:  Prescriptions Prior to Admission  Medication Sig Dispense Refill Last Dose  . albuterol (PROVENTIL) (2.5 MG/3ML) 0.083% nebulizer solution TAKE 3 ML BY NEBULIZATION Q 6 hrs prn FOR WHEEZING OR SOB Dx J44.9 75 mL 3 09/01/2016 at Unknown time  . Aspirin-Acetaminophen-Caffeine (GOODY HEADACHE PO) Take 1 packet by mouth daily as needed (headache).   08/29/2016 at Unknown time  . atorvastatin (LIPITOR) 20 MG tablet TAKE 1 TABLET (20 MG TOTAL) BY MOUTH DAILY. 90 tablet 1 Past Month at Unknown time  . doxylamine, Sleep, (SLEEP AID) 25 MG tablet Take 50 mg by mouth at bedtime.   08/31/2016 at Unknown time  . fluticasone (FLONASE) 50 MCG/ACT nasal spray Place 2 sprays into both nostrils daily. 16 g 1 Past Week at Unknown time  . furosemide (LASIX) 40 MG tablet Take 40 mg by mouth daily as needed for fluid. Reported on 09/23/2015   UNKNOWN  . methocarbamol (ROBAXIN) 500 MG tablet Take 500 mg by mouth 2 (two) times daily.  1 Past Week at Unknown time  . traMADol (ULTRAM) 50 MG tablet TAKE  TWO TABLETS BY MOUTH AT BEDTIME  1 08/31/2016 at Unknown time  . VENTOLIN HFA 108 (90 Base) MCG/ACT inhaler INHALE 2 PUFFS INTO THE LUNGS DAILY AS NEEDED FOR WHEEZING OR SHORTNESS OF BREATH. 18 Inhaler 0 UNKNOWN   Scheduled: . aspirin EC  325 mg Oral BID  . atorvastatin  20 mg Oral q1800  . enoxaparin (LOVENOX) injection  40 mg Subcutaneous Q24H  . feeding supplement (PRO-STAT SUGAR FREE 64)  30 mL Oral Daily  . fluticasone  2 spray Each Nare Daily  . furosemide  40 mg Oral Daily  . ipratropium-albuterol  3 mL Nebulization Q4H WA  . levofloxacin  750 mg Oral q1800  . mouth rinse  15 mL Mouth Rinse q12n4p  . methocarbamol  500 mg Oral BID  . methylPREDNISolone (SOLU-MEDROL) injection  40 mg Intravenous Q8H  . nicotine  21  mg Transdermal Daily  . pantoprazole  40 mg Oral Daily  . sodium chloride  1,000 mL Intravenous Once   And  . sodium chloride  1,000 mL Intravenous Once  . topiramate  50 mg Oral Daily   Continuous:  NWG:[id number]:albuterol, guaiFENesin-dextromethorphan, oxyCODONE, sodium chloride, traMADol  Assesment: She is admitted with acute hypoxic and hypercapnic respiratory failure that required BiPAP. She has community-acquired pneumonia. She was septic on admission and that is much better. She has COPD exacerbation and that's doing better. She has still been hypoxic and I told her I think it's likely that she will need to go home on oxygen. She feels like she is at close to her baseline other than wearing the oxygen Principal Problem:   Acute respiratory failure with hypoxia and hypercapnia (HCC) Active Problems:   Hyperglycemia   CAP (community acquired pneumonia)   Sepsis (HCC)   COPD exacerbation (HCC)   Elevated brain natriuretic peptide (BNP) level   LFT elevation    Plan: I think we can see if she can come off the oxygen but I think it's likely that she'll have to go home with oxygen at least temporarily    LOS: 5 days   Greycen Felter L 09/06/2016, 8:49 AM

## 2016-09-06 NOTE — Progress Notes (Signed)
SATURATION QUALIFICATIONS: (This note is used to comply with regulatory documentation for home oxygen)  Patient Saturations on Room Air at Rest = 88%  Patient Saturations on Room Air while Ambulating = 75%  Patient Saturations on 3 Liters of oxygen while Ambulating = 92%  Please briefly explain why patient needs home oxygen:

## 2016-09-07 LAB — BASIC METABOLIC PANEL
ANION GAP: 8 (ref 5–15)
BUN: 24 mg/dL — ABNORMAL HIGH (ref 6–20)
CHLORIDE: 98 mmol/L — AB (ref 101–111)
CO2: 30 mmol/L (ref 22–32)
Calcium: 8.2 mg/dL — ABNORMAL LOW (ref 8.9–10.3)
Creatinine, Ser: 0.78 mg/dL (ref 0.44–1.00)
GFR calc non Af Amer: >60 mL/min (ref >60)
Glucose, Bld: 115 mg/dL — ABNORMAL HIGH (ref 65–99)
POTASSIUM: 3.6 mmol/L (ref 3.5–5.1)
SODIUM: 136 mmol/L (ref 135–145)

## 2016-09-07 MED ORDER — GUAIFENESIN-DM 100-10 MG/5ML PO SYRP: 5.0000 mL | ORAL_SOLUTION | ORAL | 0 refills | Status: DC | PRN

## 2016-09-07 MED ORDER — PREDNISONE 20 MG PO TABS: ORAL_TABLET | ORAL | 0 refills | Status: DC

## 2016-09-07 MED ORDER — IPRATROPIUM-ALBUTEROL 0.5-2.5 (3) MG/3ML IN SOLN: 3.0000 mL | Freq: Four times a day (QID) | RESPIRATORY_TRACT | 0 refills | Status: DC | PRN

## 2016-09-07 MED ORDER — LEVOFLOXACIN 750 MG PO TABS: 750.0000 mg | ORAL_TABLET | Freq: Every day | ORAL | 0 refills | Status: DC

## 2016-09-07 MED ORDER — FUROSEMIDE 40 MG PO TABS: 40.0000 mg | ORAL_TABLET | Freq: Every day | ORAL | 0 refills | Status: DC

## 2016-09-07 MED ORDER — ALBUTEROL SULFATE (2.5 MG/3ML) 0.083% IN NEBU
INHALATION_SOLUTION | RESPIRATORY_TRACT | 3 refills | Status: DC
Start: 2016-09-07 — End: 2017-10-06

## 2016-09-07 MED ORDER — PANTOPRAZOLE SODIUM 40 MG PO TBEC: 40.0000 mg | DELAYED_RELEASE_TABLET | Freq: Every day | ORAL | 0 refills | Status: DC

## 2016-09-07 MED ORDER — ASPIRIN EC 325 MG PO TBEC
325.0000 mg | DELAYED_RELEASE_TABLET | Freq: Every day | ORAL | 0 refills | Status: DC
Start: 2016-09-07 — End: 2018-05-16

## 2016-09-07 MED ORDER — TOPIRAMATE 50 MG PO TABS: 50.0000 mg | ORAL_TABLET | Freq: Every day | ORAL | Status: DC

## 2016-09-07 MED ORDER — ASPIRIN EC 325 MG PO TBEC: 325.0000 mg | DELAYED_RELEASE_TABLET | Freq: Every day | ORAL | Status: DC

## 2016-09-07 NOTE — Care Management Note (Signed)
Case Management Note  Patient Details  Name: Bethany Villa MRN: [id number] Date of Birth: 1956-06-22  Expected Discharge Date:  09/04/16               Expected Discharge Plan:  [place]  In-House Referral:  NA  Discharge planning Services  CM Consult  Post Acute Care Choice:  Home Health, Durable Medical Equipment Choice offered to:  Patient  DME Arranged:  Oxygen DME Agency:  [place]:  PT [facility] Agency:  [place]  Status of Service:  Completed, signed off  If discussed at [place] of Stay Meetings, dates discussed: 09/07/2016    Additional Comments: Pt discharging home today with [facility] services through [place] [facility] unable to provide services due to insurance). Tommi Rumps of [place], is aware of referral and will obtain pt info from chart. Pt is aware that [facility] has 48hrs to  Make first visit. Pt has met requirements for supplemental oxygen and has chosen to used [facility] for DME. [facility], is aware and will obtain pt info from chart and deliver oxygen to room prior to DC. Anticipate DC home today. No further CM needs anticipated.   Sherald Barge, RN 09/07/2016, 9:09 AM

## 2016-09-07 NOTE — Progress Notes (Signed)
She says she hopes to be discharged later today. She is generally doing better. She's going to have oxygen at least temporarily. She will need follow-up chest x-ray to document clearing of the infiltrate. I will plan to sign off. Thanks for allowing me to see her with you

## 2016-09-07 NOTE — Progress Notes (Signed)
Patient states understanding of discharge instructions.  

## 2016-09-09 ENCOUNTER — Telehealth: Payer: Self-pay | Admitting: Nurse Practitioner

## 2016-09-09 ENCOUNTER — Other Ambulatory Visit: Payer: Self-pay | Admitting: Nurse Practitioner

## 2016-09-09 MED ORDER — NYSTATIN 100000 UNIT/ML MT SUSP: 5.0000 mL | Freq: Four times a day (QID) | OROMUCOSAL | 0 refills | Status: DC

## 2016-09-09 NOTE — Discharge Summary (Addendum)
Physician Discharge Summary  Bethany Villa:[id number] DOB: 1956/08/05 DOA: 09/01/2016  PCP: Bennie Pierini, FNP  Admit date: 09/01/2016 Discharge date: 09/09/2016  Admitted From: Home Disposition:  Home.   Recommendations for Outpatient Follow-up:  1. Follow up with PCP in 1-2 weeks 2. Please obtain BMP/CBC in one week   Home Health:yes.  Equipment/Devices: 3 lit of oxygen.   Discharge Condition: stable.  CODE STATUS: full code.  Diet recommendation: Heart Healthy  Brief/Interim Summary: Bethany Villa a 61 y.o.femalewith medical history significant of COPD previously on home O2 with h/o intubation presents with worsenign cough, associated with sob, . Was admitted to hospitalist service for acute respiratory failure with hypoxia and hypercapnia and respiratory acidosis. She required BIPAP, and admitted to step down for further evaluation.  Pulmonary consulted and recommendations given.   Discharge Diagnoses:  Principal Problem:   Acute respiratory failure with hypoxia and hypercapnia (HCC) Active Problems:   Hyperglycemia   CAP (community acquired pneumonia)   Sepsis (HCC)   COPD exacerbation (HCC)   Elevated brain natriuretic peptide (BNP) level   LFT elevation  Acute respiratory failure with acidosis, with hypoxia and hypercapnia: Admitted to step down was on BIPAP, weaned her off bipap to nasal [place] oxygen ,on 3 lit of [place] oxygen. Probably secondary to CAP, copd exacerbation and probably acute on chronic diastolic heart failure exacerbation.  Started on Levaquin, blood cultures ordered and negative. Influenza PCR is negative. Resume levaquin to complete the course.  Started on IV steroids, duonebs and mucinex. Transitioned to po prednisone as wheezing has improved. Taper prednisone on discharge.  Elevated BNP, CXR shows bilateral infiltrates. Repeat CXR shows improving infiltrates.  Echocardiogram ordered to evaluate for CHF shows diastolic dysfunction.  Started her on lasix and will probably need a low dose on discharge, discussed with the patient Negative troponins.      Elevated lactic acid: probably from CAP.  Trend lactic acid. Still elevated,. Repeat in am shows normal Lactic acid.    Elevated liver function tests: resolved.    Discharge Instructions  Discharge Instructions    Diet - low sodium heart healthy    Complete by:  As directed      Allergies as of 09/07/2016      Reactions   Gabapentin Shortness Of Breath, Rash   Mobic [meloxicam] Anaphylaxis   Penicillins Shortness Of Breath, Swelling   Has patient had a PCN reaction causing immediate rash, facial/tongue/throat swelling, SOB or lightheadedness with hypotension: unknown Has patient had a PCN reaction causing severe rash involving mucus membranes or skin necrosis: {unknown Has patient had a PCN reaction that required hospitalization {unknown Has patient had a PCN reaction occurring within the last 10 years: no If all of the above answers are "NO", then may proceed with Cephalosporin use.   Sulfa Antibiotics Shortness Of Breath, Swelling      Medication List    STOP taking these medications   GOODY HEADACHE PO   omeprazole 20 MG capsule Commonly known as:  PRILOSEC Replaced by:  pantoprazole 40 MG tablet     TAKE these medications   aspirin EC 325 MG tablet Take 1 tablet (325 mg total) by mouth daily. What changed:  when to take this   atorvastatin 20 MG tablet Commonly known as:  LIPITOR TAKE 1 TABLET (20 MG TOTAL) BY MOUTH DAILY.   fluticasone 50 MCG/ACT nasal spray Commonly known as:  FLONASE Place 2 sprays into both nostrils daily.   furosemide 40 MG tablet Commonly  known as:  LASIX Take 1 tablet (40 mg total) by mouth daily. Reported on 09/23/2015 What changed:  when to take this  reasons to take this   guaiFENesin-dextromethorphan 100-10 MG/5ML syrup Commonly known as:  ROBITUSSIN DM Take 5 mLs by mouth every 4 (four) hours as  needed for cough.   ipratropium-albuterol 0.5-2.5 (3) MG/3ML Soln Commonly known as:  DUONEB Take 3 mLs by nebulization every 6 (six) hours as needed.   levofloxacin 750 MG tablet Commonly known as:  LEVAQUIN Take 1 tablet (750 mg total) by mouth daily at 6 PM.   methocarbamol 500 MG tablet Commonly known as:  ROBAXIN Take 500 mg by mouth 2 (two) times daily.   pantoprazole 40 MG tablet Commonly known as:  PROTONIX Take 1 tablet (40 mg total) by mouth daily. Replaces:  omeprazole 20 MG capsule   predniSONE 20 MG tablet Commonly known as:  DELTASONE Prednisone 40 mg daily for 3 days followed by  Prednisone 20 mg daily for 3 days.   SLEEP AID 25 MG tablet Generic drug:  doxylamine (Sleep) Take 50 mg by mouth at bedtime.   topiramate 50 MG tablet Commonly known as:  TOPAMAX Take 1 tablet (50 mg total) by mouth daily. What changed:  See the new instructions.   traMADol 50 MG tablet Commonly known as:  ULTRAM TAKE TWO TABLETS BY MOUTH AT BEDTIME   VENTOLIN HFA 108 (90 Base) MCG/ACT inhaler Generic drug:  albuterol INHALE 2 PUFFS INTO THE LUNGS DAILY AS NEEDED FOR WHEEZING OR SHORTNESS OF BREATH.   albuterol (2.5 MG/3ML) 0.083% nebulizer solution Commonly known as:  PROVENTIL TAKE 3 ML BY NEBULIZATION Q 6 hrs prn FOR WHEEZING OR SOB Dx J44.9      Follow-up Information    [name], FNP. Schedule an appointment as soon as possible for a visit in 1 week(s).   Specialty:  Family Medicine Why:  get CXR in 2 to 3 weeks to evaluate for resolution  Contact information: [address] [place] [id number] [phone]          Allergies  Allergen Reactions  . Gabapentin Shortness Of Breath and Rash  . Mobic [Meloxicam] Anaphylaxis  . Penicillins Shortness Of Breath and Swelling    Has patient had a PCN reaction causing immediate rash, facial/tongue/throat swelling, SOB or lightheadedness with hypotension: unknown Has patient had a PCN reaction  causing severe rash involving mucus membranes or skin necrosis: {unknown Has patient had a PCN reaction that required hospitalization {unknown Has patient had a PCN reaction occurring within the last 10 years: no If all of the above answers are "NO", then may proceed with Cephalosporin use.  . Sulfa Antibiotics Shortness Of Breath and Swelling    Consultations:  Pulmonology.    Procedures/Studies: Dg Chest 2 View  Result Date: 09/03/2016 CLINICAL DATA:  Wheezing, sick with pneumonia since last Sunday, history asthma, COPD, fibromyalgia, smoker EXAM: CHEST  2 VIEW COMPARISON:  09/01/2016 FINDINGS: Normal heart size, mediastinal contours and pulmonary vascularity for technique. Atherosclerotic calcification aortic arch. Minimal bronchitic changes. Improved pulmonary infiltrates since previous exam. No infiltrate, pleural effusion or pneumothorax. Bones demineralized. IMPRESSION: Bronchitic changes with improved pulmonary infiltrates since previous study. No new abnormalities. Aortic atherosclerosis. Electronically Signed   By: Ulyses SouthwardMark  Boles M.D.   On: 09/03/2016 13:38   Dg Chest Port 1 View  Result Date: 09/01/2016 CLINICAL DATA:  The days of shortness of breath. History of COPD. Current smoker. EXAM: PORTABLE CHEST 1 VIEW COMPARISON:  PA and lateral chest x-ray of April 07, 2016 FINDINGS: The lungs are well-expanded. The interstitial markings are increased diffusely. The heart is top-normal in size. The pulmonary vascularity is mildly prominent centrally. There is calcification in the wall of the aortic arch. There is no pleural effusion. The bony thorax exhibits no acute abnormality. IMPRESSION: Bilaterally increased interstitial infiltrates likely reflecting pneumonia. CHF superimposed upon COPD could produce similar findings. Correlation with patient's clinical and laboratory values will be needed. Follow-up radiographs following therapy would be useful to assure return to baseline. Electronically  Signed   By: David  [place] M.D.   On: 09/01/2016 15:53    Echocardiogram.    Subjective: No complaints.   Discharge Exam: Vitals:   09/06/16 2131 09/07/16 0502  BP: 120/60 (!) 113/92  Pulse: 82 98  Resp: 18 18  Temp: 97.9 F (36.6 C) 98.5 F (36.9 C)   Vitals:   09/06/16 2131 09/06/16 2204 09/07/16 0502 09/07/16 0811  BP: 120/60  (!) 113/92   Pulse: 82  98   Resp: 18  18   Temp: 97.9 F (36.6 C)  98.5 F (36.9 C)   TempSrc: Oral  Oral   SpO2: 96% 98% 97% 95%  Weight:      Height:        General: Pt is alert, awake, not in acute distress Cardiovascular: RRR, S1/S2 +, no rubs, no gallops Respiratory: CTA bilaterally, no wheezing, no rhonchi Abdominal: Soft, NT, ND, bowel sounds + Extremities: no edema, no cyanosis    The results of significant diagnostics from this hospitalization (including imaging, microbiology, ancillary and laboratory) are listed below for reference.     Microbiology: Recent Results (from the past 240 hour(s))  Culture, blood (Routine X 2) w Reflex to ID Panel     Status: None   Collection Time: 09/01/16  3:37 PM  Result Value Ref Range Status   Specimen Description BLOOD RIGHT FOREARM  Final   Special Requests BOTTLES DRAWN AEROBIC AND ANAEROBIC 6CC  Final   Culture NO GROWTH 5 DAYS  Final   Report Status 09/06/2016 FINAL  Final  Culture, blood (Routine X 2) w Reflex to ID Panel     Status: Abnormal   Collection Time: 09/01/16  3:43 PM  Result Value Ref Range Status   Specimen Description LEFT ANTECUBITAL  Final   Special Requests BOTTLES DRAWN AEROBIC AND ANAEROBIC 6CC  Final   Culture  Setup Time   Final    GRAM POSITIVE RODS AEROBIC BOTTLE ONLY Gram Stain Report Called to,Read Back By and Verified With: PHILLIPS,C AT 0610 BY HUFFINES,S ON 09/03/16. CRITICAL RESULT CALLED TO, READ BACK BY AND VERIFIED WITH: L SEAY,PHARMD AT 1440 09/03/16 BY L BENFIELD    Culture (A)  Final    DIPHTHEROIDS(CORYNEBACTERIUM SPECIES) Standardized  susceptibility testing for this organism is not available. Performed at [facility]    Report Status 09/05/2016 FINAL  Final  Blood Culture ID Panel (Reflexed)     Status: None   Collection Time: 09/01/16  3:43 PM  Result Value Ref Range Status   Enterococcus species NOT DETECTED NOT DETECTED Final   Listeria monocytogenes NOT DETECTED NOT DETECTED Final   Staphylococcus species NOT DETECTED NOT DETECTED Final   Staphylococcus aureus NOT DETECTED NOT DETECTED Final   Streptococcus species NOT DETECTED NOT DETECTED Final   Streptococcus agalactiae NOT DETECTED NOT DETECTED Final   Streptococcus pneumoniae NOT DETECTED NOT DETECTED Final   Streptococcus pyogenes NOT DETECTED NOT DETECTED Final  Acinetobacter baumannii NOT DETECTED NOT DETECTED Final   Enterobacteriaceae species NOT DETECTED NOT DETECTED Final   Enterobacter cloacae complex NOT DETECTED NOT DETECTED Final   Escherichia coli NOT DETECTED NOT DETECTED Final   Klebsiella oxytoca NOT DETECTED NOT DETECTED Final   Klebsiella pneumoniae NOT DETECTED NOT DETECTED Final   Proteus species NOT DETECTED NOT DETECTED Final   Serratia marcescens NOT DETECTED NOT DETECTED Final   Haemophilus influenzae NOT DETECTED NOT DETECTED Final   Neisseria meningitidis NOT DETECTED NOT DETECTED Final   Pseudomonas aeruginosa NOT DETECTED NOT DETECTED Final   Candida albicans NOT DETECTED NOT DETECTED Final   Candida glabrata NOT DETECTED NOT DETECTED Final   Candida krusei NOT DETECTED NOT DETECTED Final   Candida parapsilosis NOT DETECTED NOT DETECTED Final   Candida tropicalis NOT DETECTED NOT DETECTED Final    Comment: Performed at [facility]  MRSA PCR Screening     Status: None   Collection Time: 09/01/16  8:05 PM  Result Value Ref Range Status   MRSA by PCR NEGATIVE NEGATIVE Final    Comment:        The GeneXpert MRSA Assay (FDA approved for NASAL specimens only), is one component of a comprehensive MRSA  colonization surveillance program. It is not intended to diagnose MRSA infection nor to guide or monitor treatment for MRSA infections.   Urine culture     Status: None   Collection Time: 09/01/16 10:30 PM  Result Value Ref Range Status   Specimen Description URINE, CLEAN CATCH  Final   Special Requests NONE  Final   Culture NO GROWTH Performed at [facility]   Final   Report Status 09/03/2016 FINAL  Final     Labs: BNP (last 3 results)  Recent Labs  09/01/16 1536  BNP 2,086.0*   Basic Metabolic Panel:  Recent Labs Lab 09/03/16 0451 09/07/16 0616  NA 141 136  K 3.6 3.6  CL 105 98*  CO2 29 30  GLUCOSE 126* 115*  BUN 17 24*  CREATININE 0.65 0.78  CALCIUM 8.2* 8.2*   Liver Function Tests: No results for input(s): AST, ALT, ALKPHOS, BILITOT, PROT, ALBUMIN in the last 168 hours. No results for input(s): LIPASE, AMYLASE in the last 168 hours. No results for input(s): AMMONIA in the last 168 hours. CBC: No results for input(s): WBC, NEUTROABS, HGB, HCT, MCV, PLT in the last 168 hours. Cardiac Enzymes: No results for input(s): CKTOTAL, CKMB, CKMBINDEX, TROPONINI in the last 168 hours. BNP: Invalid input(s): POCBNP CBG: No results for input(s): GLUCAP in the last 168 hours. D-Dimer No results for input(s): DDIMER in the last 72 hours. Hgb A1c No results for input(s): HGBA1C in the last 72 hours. Lipid Profile No results for input(s): CHOL, HDL, LDLCALC, TRIG, CHOLHDL, LDLDIRECT in the last 72 hours. Thyroid function studies No results for input(s): TSH, T4TOTAL, T3FREE, THYROIDAB in the last 72 hours.  Invalid input(s): FREET3 Anemia work up No results for input(s): VITAMINB12, FOLATE, FERRITIN, TIBC, IRON, RETICCTPCT in the last 72 hours. Urinalysis    Component Value Date/Time   COLORURINE AMBER (A) 09/01/2016 2230   APPEARANCEUR TURBID (A) 09/01/2016 2230   APPEARANCEUR Hazy 08/22/2013 1111   LABSPEC 1.026 09/01/2016 2230   LABSPEC 1.016  08/22/2013 1111   PHURINE 6.0 09/01/2016 2230   GLUCOSEU NEGATIVE 09/01/2016 2230   GLUCOSEU Negative 08/22/2013 1111   HGBUR NEGATIVE 09/01/2016 2230   BILIRUBINUR NEGATIVE 09/01/2016 2230   BILIRUBINUR Negative 08/22/2013 1111   KETONESUR  NEGATIVE 09/01/2016 2230   PROTEINUR >=300 (A) 09/01/2016 2230   UROBILINOGEN 0.2 07/24/2014 0635   NITRITE NEGATIVE 09/01/2016 2230   LEUKOCYTESUR NEGATIVE 09/01/2016 2230   LEUKOCYTESUR 1+ 08/22/2013 1111   Sepsis Labs Invalid input(s): PROCALCITONIN,  WBC,  LACTICIDVEN Microbiology Recent Results (from the past 240 hour(s))  Culture, blood (Routine X 2) w Reflex to ID Panel     Status: None   Collection Time: 09/01/16  3:37 PM  Result Value Ref Range Status   Specimen Description BLOOD RIGHT FOREARM  Final   Special Requests BOTTLES DRAWN AEROBIC AND ANAEROBIC 6CC  Final   Culture NO GROWTH 5 DAYS  Final   Report Status 09/06/2016 FINAL  Final  Culture, blood (Routine X 2) w Reflex to ID Panel     Status: Abnormal   Collection Time: 09/01/16  3:43 PM  Result Value Ref Range Status   Specimen Description LEFT ANTECUBITAL  Final   Special Requests BOTTLES DRAWN AEROBIC AND ANAEROBIC 6CC  Final   Culture  Setup Time   Final    GRAM POSITIVE RODS AEROBIC BOTTLE ONLY Gram Stain Report Called to,Read Back By and Verified With: PHILLIPS,C AT 0610 BY HUFFINES,S ON 09/03/16. CRITICAL RESULT CALLED TO, READ BACK BY AND VERIFIED WITH: L SEAY,PHARMD AT 1440 09/03/16 BY L BENFIELD    Culture (A)  Final    DIPHTHEROIDS(CORYNEBACTERIUM SPECIES) Standardized susceptibility testing for this organism is not available. Performed at [facility]    Report Status 09/05/2016 FINAL  Final  Blood Culture ID Panel (Reflexed)     Status: None   Collection Time: 09/01/16  3:43 PM  Result Value Ref Range Status   Enterococcus species NOT DETECTED NOT DETECTED Final   Listeria monocytogenes NOT DETECTED NOT DETECTED Final   Staphylococcus species NOT  DETECTED NOT DETECTED Final   Staphylococcus aureus NOT DETECTED NOT DETECTED Final   Streptococcus species NOT DETECTED NOT DETECTED Final   Streptococcus agalactiae NOT DETECTED NOT DETECTED Final   Streptococcus pneumoniae NOT DETECTED NOT DETECTED Final   Streptococcus pyogenes NOT DETECTED NOT DETECTED Final   Acinetobacter baumannii NOT DETECTED NOT DETECTED Final   Enterobacteriaceae species NOT DETECTED NOT DETECTED Final   Enterobacter cloacae complex NOT DETECTED NOT DETECTED Final   Escherichia coli NOT DETECTED NOT DETECTED Final   Klebsiella oxytoca NOT DETECTED NOT DETECTED Final   Klebsiella pneumoniae NOT DETECTED NOT DETECTED Final   Proteus species NOT DETECTED NOT DETECTED Final   Serratia marcescens NOT DETECTED NOT DETECTED Final   Haemophilus influenzae NOT DETECTED NOT DETECTED Final   Neisseria meningitidis NOT DETECTED NOT DETECTED Final   Pseudomonas aeruginosa NOT DETECTED NOT DETECTED Final   Candida albicans NOT DETECTED NOT DETECTED Final   Candida glabrata NOT DETECTED NOT DETECTED Final   Candida krusei NOT DETECTED NOT DETECTED Final   Candida parapsilosis NOT DETECTED NOT DETECTED Final   Candida tropicalis NOT DETECTED NOT DETECTED Final    Comment: Performed at [facility]  MRSA PCR Screening     Status: None   Collection Time: 09/01/16  8:05 PM  Result Value Ref Range Status   MRSA by PCR NEGATIVE NEGATIVE Final    Comment:        The GeneXpert MRSA Assay (FDA approved for NASAL specimens only), is one component of a comprehensive MRSA colonization surveillance program. It is not intended to diagnose MRSA infection nor to guide or monitor treatment for MRSA infections.   Urine culture  Status: None   Collection Time: 09/01/16 10:30 PM  Result Value Ref Range Status   Specimen Description URINE, CLEAN CATCH  Final   Special Requests NONE  Final   Culture NO GROWTH Performed at [facility]   Final   Report Status  09/03/2016 FINAL  Final     Time coordinating discharge: Over 30 minutes  SIGNED:   Kathlen Mody, MD  [facility] 09/09/2016, 9:50 AM Pager   If 7PM-7AM, please contact night-coverage [email or web address] Password [email or web address]

## 2016-09-27 ENCOUNTER — Ambulatory Visit: Payer: Medicare Other | Admitting: Family Medicine

## 2016-09-28 ENCOUNTER — Encounter: Payer: Self-pay | Admitting: Nurse Practitioner

## 2016-09-28 ENCOUNTER — Telehealth: Payer: Self-pay | Admitting: Nurse Practitioner

## 2016-09-28 NOTE — Telephone Encounter (Signed)
Left message to call back to reschedule missed appointment.

## 2016-09-30 ENCOUNTER — Other Ambulatory Visit: Payer: Self-pay | Admitting: Nurse Practitioner

## 2016-11-05 ENCOUNTER — Other Ambulatory Visit: Payer: Self-pay | Admitting: Nurse Practitioner

## 2017-01-20 IMAGING — CR DG CHEST 1V PORT
1 series · 1 of 1 positions shown · non-contrast
Comparison: 09/02/2015 .

CLINICAL DATA: Pneumonia.

EXAM:
PORTABLE CHEST 1 VIEW

[AP]
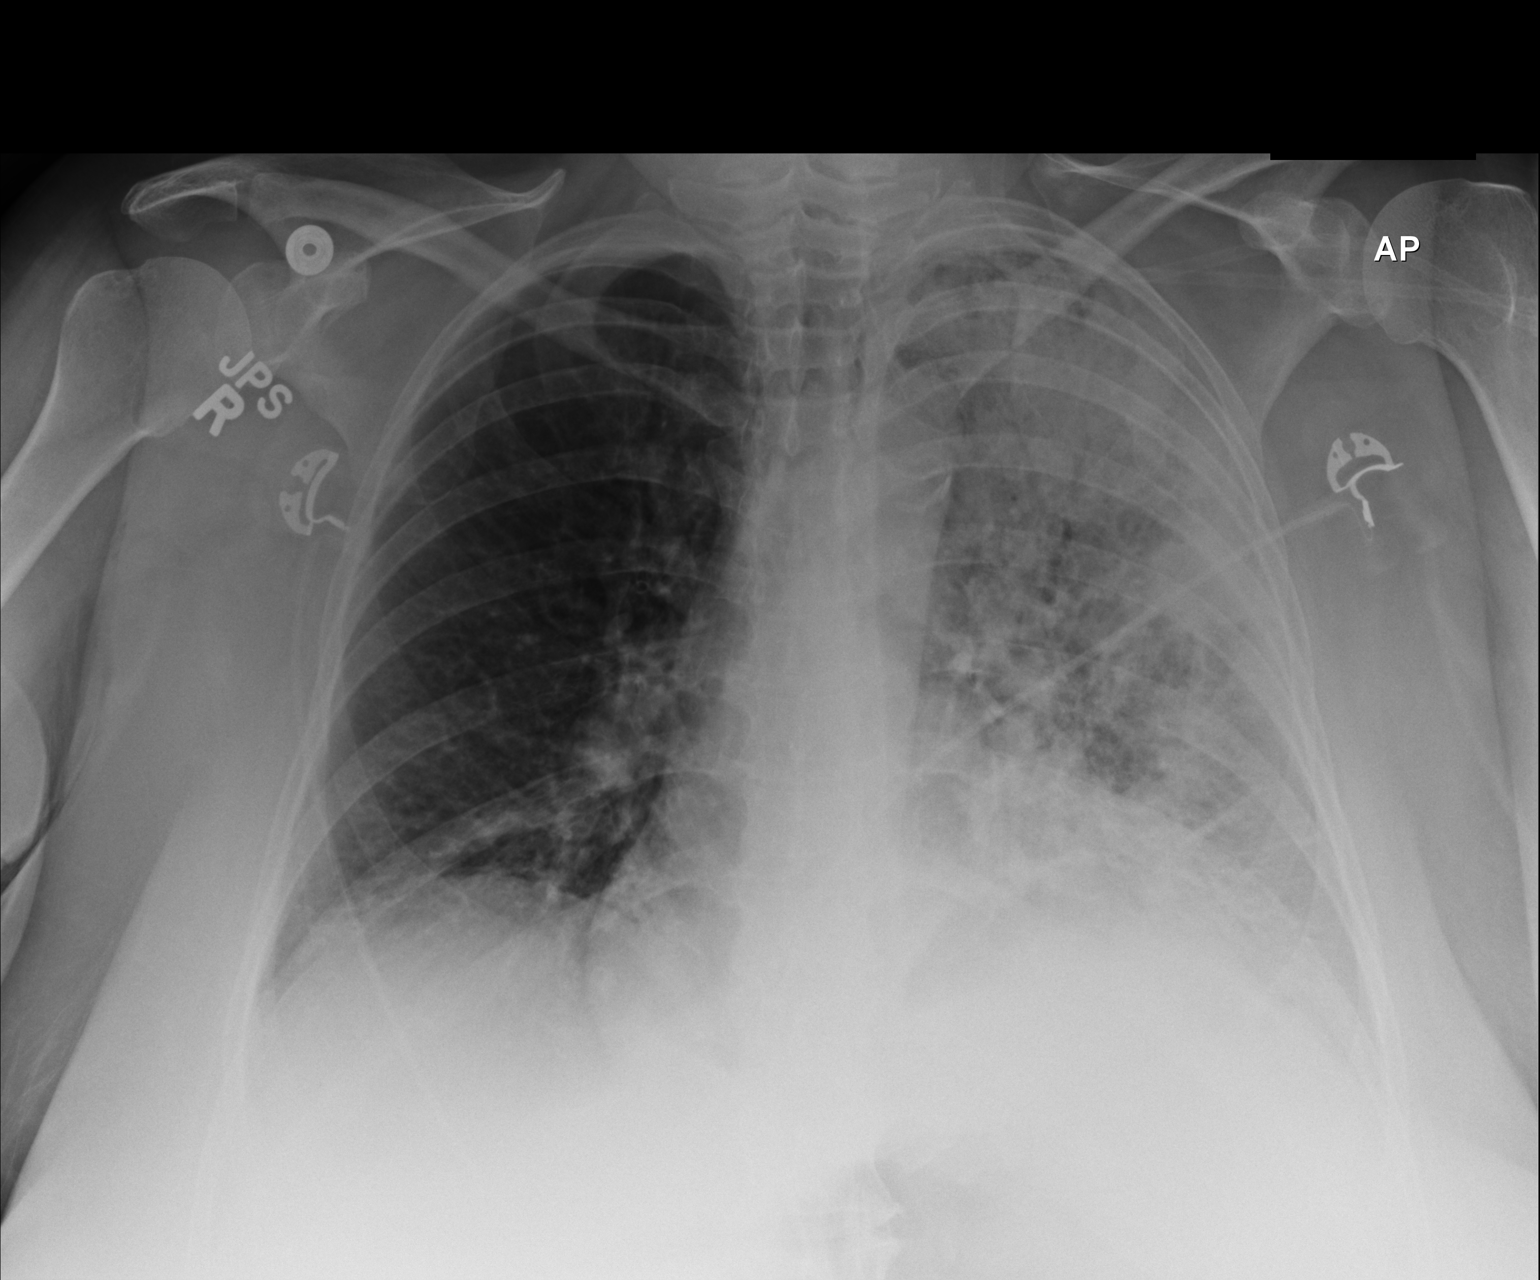

[1 of 1 positions shown; findings below may reference images not displayed]

FINDINGS: Mediastinum hilar structures are unremarkable . Diffuse left lung
infiltrate, slight interim improvement. Right lung is clear.
Cardiomegaly. No pleural effusion or pneumothorax.
IMPRESSION: 1. Diffuse left lung infiltrate, slightly improved. Mild atelectasis
and/or infiltrate right lung base.

2. Stable cardiomegaly.  No pulmonary venous congestion.

## 2017-01-21 IMAGING — DX DG CHEST 2V
2 series · 2 of 2 positions shown · non-contrast
Comparison: Portable chest x-ray September 03, 2015

CLINICAL DATA: Cough and shortness of breath for the past month

EXAM:
CHEST  2 VIEW

[x chest ap]
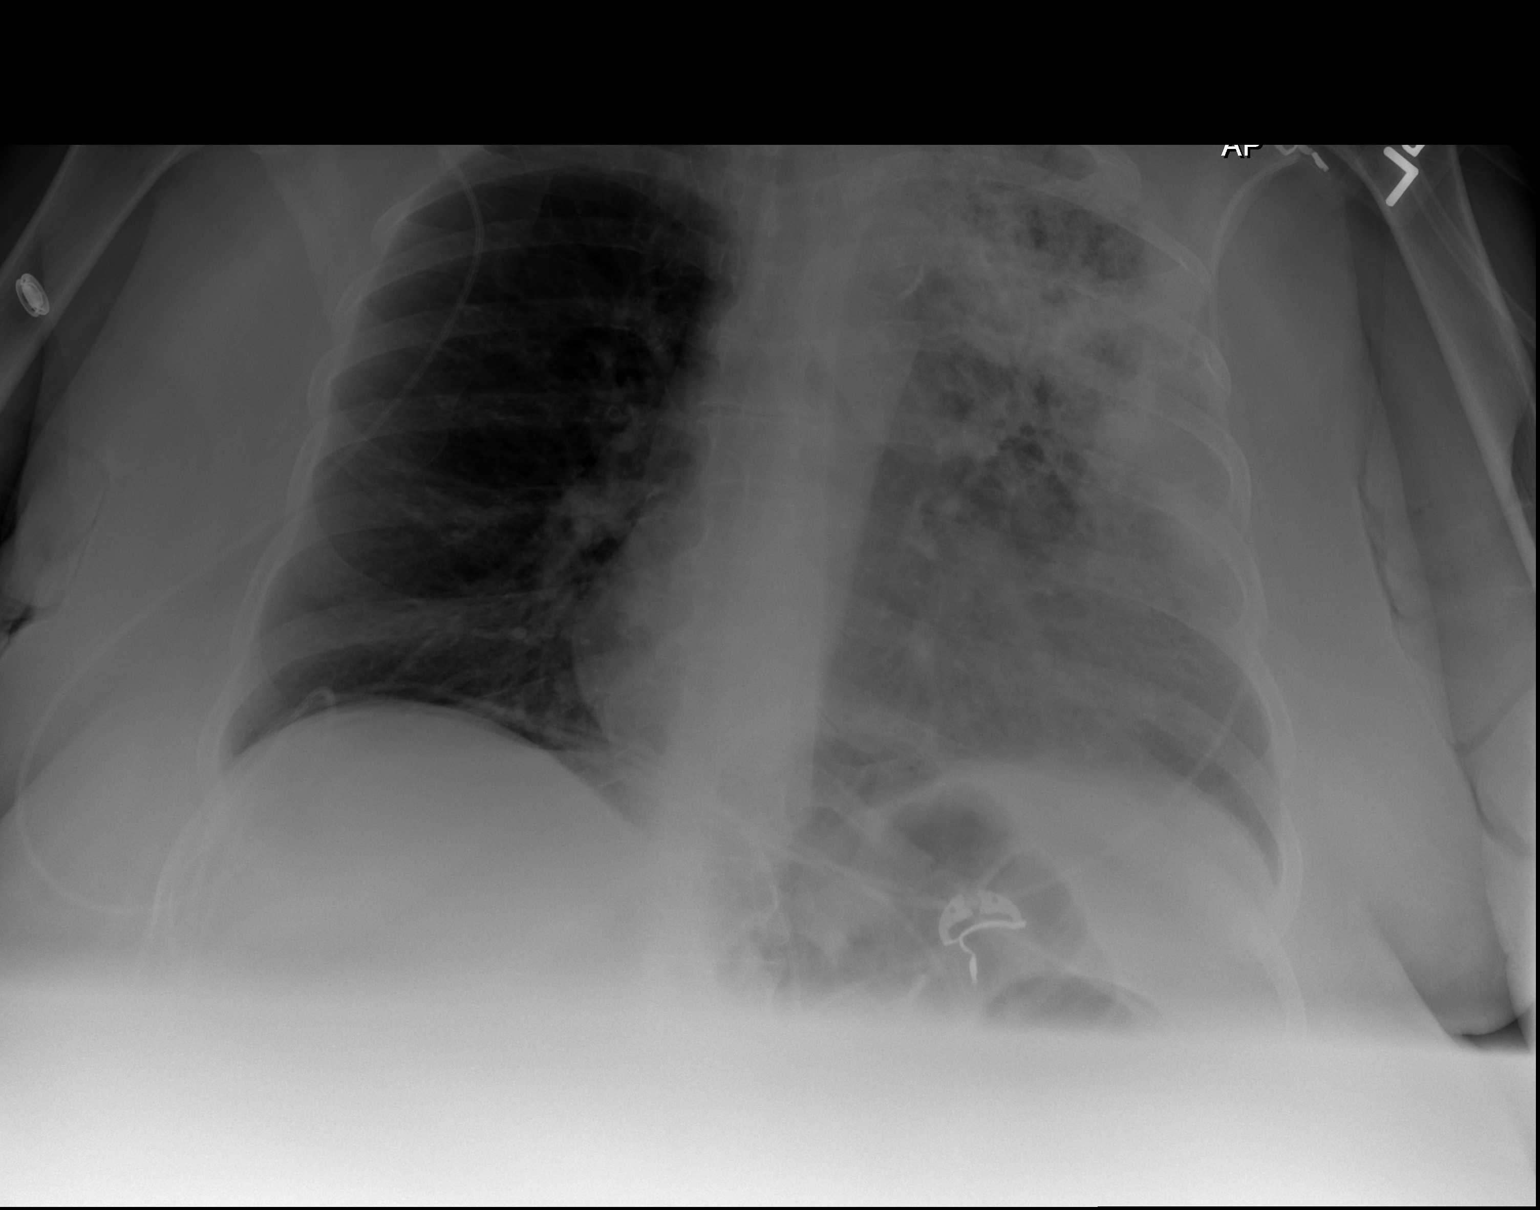

[w chest lat]
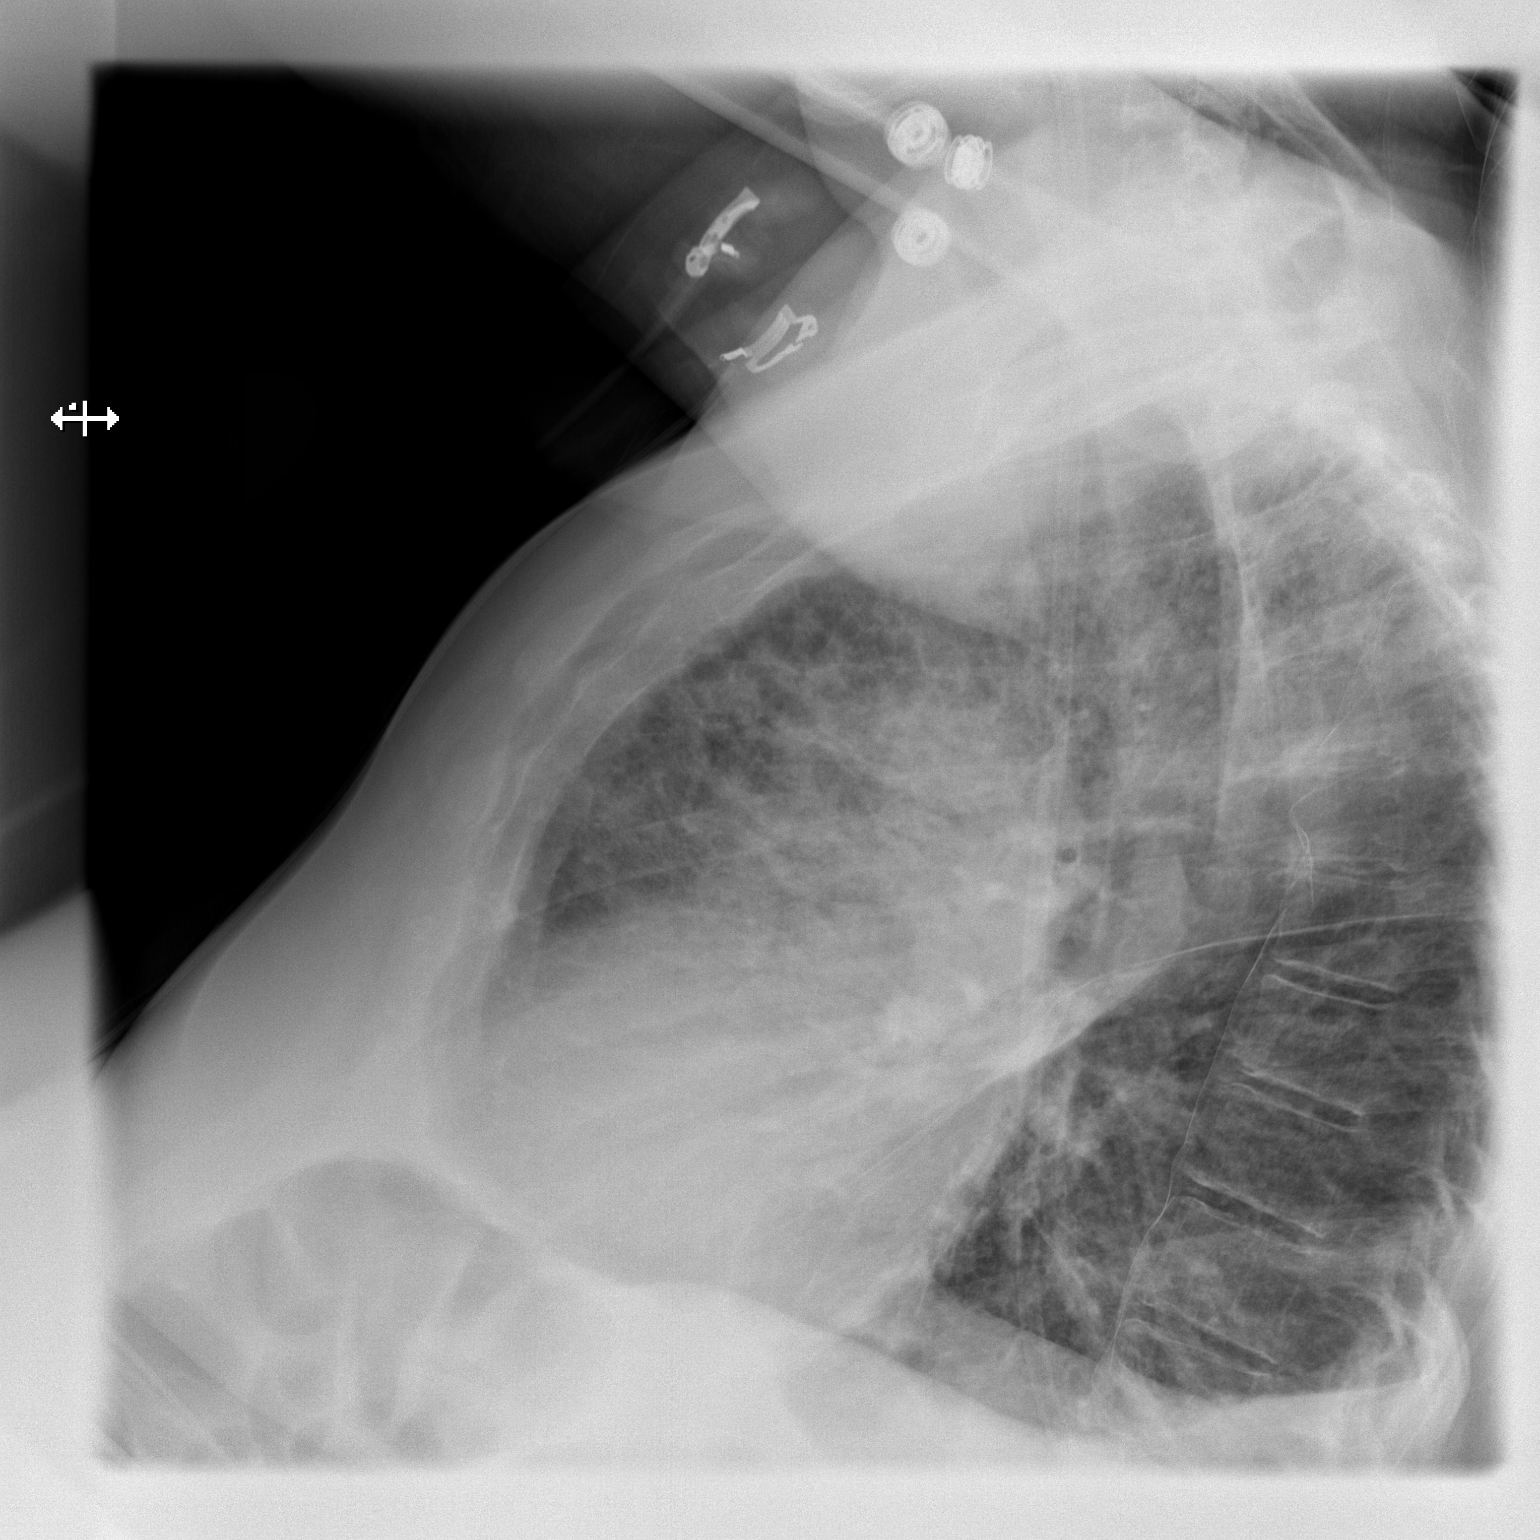

[2 of 2 positions shown; findings below may reference images not displayed]

FINDINGS: The right lung is clear. On the left there is persistent
interstitial and alveolar opacity throughout the upper lobe. There
is hazy increased density in the lower lobe with obscuration of the
heart border. There is pleural fluid blunting the posterior
costophrenic angle on the left. The bony thorax exhibits no acute
abnormality.
IMPRESSION: Persistent infiltrate throughout the left lung worrisome for
pneumonia. Improved appearance of the right lung base. No evidence
of pulmonary edema.

## 2017-05-03 ENCOUNTER — Other Ambulatory Visit: Payer: Self-pay | Admitting: Nurse Practitioner

## 2017-05-05 ENCOUNTER — Other Ambulatory Visit: Payer: Self-pay | Admitting: *Deleted

## 2017-06-07 ENCOUNTER — Telehealth: Payer: Self-pay | Admitting: Nurse Practitioner

## 2017-06-08 ENCOUNTER — Other Ambulatory Visit: Payer: Self-pay | Admitting: Nurse Practitioner

## 2017-06-08 MED ORDER — ALBUTEROL SULFATE HFA 108 (90 BASE) MCG/ACT IN AERS: INHALATION_SPRAY | RESPIRATORY_TRACT | 0 refills | Status: DC

## 2017-06-08 NOTE — Telephone Encounter (Signed)
Patient has not been seen since 09/26/15  MMM

## 2017-06-10 ENCOUNTER — Telehealth: Payer: Self-pay | Admitting: Nurse Practitioner

## 2017-06-10 NOTE — Telephone Encounter (Signed)
This patient has no encounters showing visits with [facility] for over a year. Please advise if you plan to give any refills.

## 2017-06-28 ENCOUNTER — Other Ambulatory Visit: Payer: Self-pay | Admitting: *Deleted

## 2017-06-28 MED ORDER — FLUTICASONE PROPIONATE 50 MCG/ACT NA SUSP: NASAL | 1 refills | Status: DC

## 2017-06-29 ENCOUNTER — Other Ambulatory Visit: Payer: Self-pay | Admitting: Nurse Practitioner

## 2017-09-30 ENCOUNTER — Emergency Department: Payer: Medicare Other

## 2017-09-30 ENCOUNTER — Inpatient Hospital Stay
Admission: EM | Admit: 2017-09-30 | Discharge: 2017-10-07 | DRG: 190 | Disposition: A | Discharge status: Home / Self Care | Payer: Medicare Other | Attending: Internal Medicine | Admitting: Internal Medicine

## 2017-09-30 ENCOUNTER — Other Ambulatory Visit: Payer: Self-pay

## 2017-09-30 ENCOUNTER — Encounter: Payer: Self-pay | Admitting: Emergency Medicine

## 2017-09-30 DIAGNOSIS — K219 Gastro-esophageal reflux disease without esophagitis: Secondary | ICD-10-CM | POA: Diagnosis present

## 2017-09-30 DIAGNOSIS — G8929 Other chronic pain: Secondary | ICD-10-CM | POA: Diagnosis present

## 2017-09-30 DIAGNOSIS — Z888 Allergy status to other drugs, medicaments and biological substances status: Secondary | ICD-10-CM

## 2017-09-30 DIAGNOSIS — G4733 Obstructive sleep apnea (adult) (pediatric): Secondary | ICD-10-CM | POA: Diagnosis present

## 2017-09-30 DIAGNOSIS — Z880 Allergy status to penicillin: Secondary | ICD-10-CM

## 2017-09-30 DIAGNOSIS — M797 Fibromyalgia: Secondary | ICD-10-CM | POA: Diagnosis present

## 2017-09-30 DIAGNOSIS — Z881 Allergy status to other antibiotic agents status: Secondary | ICD-10-CM

## 2017-09-30 DIAGNOSIS — J441 Chronic obstructive pulmonary disease with (acute) exacerbation: Secondary | ICD-10-CM | POA: Diagnosis not present

## 2017-09-30 DIAGNOSIS — Z7951 Long term (current) use of inhaled steroids: Secondary | ICD-10-CM

## 2017-09-30 DIAGNOSIS — I493 Ventricular premature depolarization: Secondary | ICD-10-CM | POA: Diagnosis not present

## 2017-09-30 DIAGNOSIS — Z818 Family history of other mental and behavioral disorders: Secondary | ICD-10-CM

## 2017-09-30 DIAGNOSIS — R7989 Other specified abnormal findings of blood chemistry: Secondary | ICD-10-CM

## 2017-09-30 DIAGNOSIS — J209 Acute bronchitis, unspecified: Secondary | ICD-10-CM | POA: Diagnosis present

## 2017-09-30 DIAGNOSIS — E785 Hyperlipidemia, unspecified: Secondary | ICD-10-CM | POA: Diagnosis present

## 2017-09-30 DIAGNOSIS — J9600 Acute respiratory failure, unspecified whether with hypoxia or hypercapnia: Secondary | ICD-10-CM

## 2017-09-30 DIAGNOSIS — R0603 Acute respiratory distress: Secondary | ICD-10-CM

## 2017-09-30 DIAGNOSIS — G4700 Insomnia, unspecified: Secondary | ICD-10-CM | POA: Diagnosis present

## 2017-09-30 DIAGNOSIS — Z915 Personal history of self-harm: Secondary | ICD-10-CM

## 2017-09-30 DIAGNOSIS — R45851 Suicidal ideations: Secondary | ICD-10-CM | POA: Diagnosis not present

## 2017-09-30 DIAGNOSIS — R945 Abnormal results of liver function studies: Secondary | ICD-10-CM | POA: Diagnosis present

## 2017-09-30 DIAGNOSIS — Z79899 Other long term (current) drug therapy: Secondary | ICD-10-CM

## 2017-09-30 DIAGNOSIS — J9690 Respiratory failure, unspecified, unspecified whether with hypoxia or hypercapnia: Secondary | ICD-10-CM | POA: Diagnosis present

## 2017-09-30 DIAGNOSIS — J440 Chronic obstructive pulmonary disease with acute lower respiratory infection: Secondary | ICD-10-CM | POA: Diagnosis present

## 2017-09-30 DIAGNOSIS — Z96652 Presence of left artificial knee joint: Secondary | ICD-10-CM | POA: Diagnosis present

## 2017-09-30 DIAGNOSIS — Z515 Encounter for palliative care: Secondary | ICD-10-CM

## 2017-09-30 DIAGNOSIS — Z79891 Long term (current) use of opiate analgesic: Secondary | ICD-10-CM

## 2017-09-30 DIAGNOSIS — J9601 Acute respiratory failure with hypoxia: Secondary | ICD-10-CM

## 2017-09-30 DIAGNOSIS — F17210 Nicotine dependence, cigarettes, uncomplicated: Secondary | ICD-10-CM | POA: Diagnosis present

## 2017-09-30 DIAGNOSIS — Z7982 Long term (current) use of aspirin: Secondary | ICD-10-CM

## 2017-09-30 DIAGNOSIS — F410 Panic disorder [episodic paroxysmal anxiety] without agoraphobia: Secondary | ICD-10-CM | POA: Diagnosis present

## 2017-09-30 DIAGNOSIS — R778 Other specified abnormalities of plasma proteins: Secondary | ICD-10-CM

## 2017-09-30 DIAGNOSIS — Z23 Encounter for immunization: Secondary | ICD-10-CM

## 2017-09-30 DIAGNOSIS — Z716 Tobacco abuse counseling: Secondary | ICD-10-CM

## 2017-09-30 DIAGNOSIS — Z22322 Carrier or suspected carrier of Methicillin resistant Staphylococcus aureus: Secondary | ICD-10-CM

## 2017-09-30 DIAGNOSIS — F329 Major depressive disorder, single episode, unspecified: Secondary | ICD-10-CM | POA: Diagnosis present

## 2017-09-30 DIAGNOSIS — R748 Abnormal levels of other serum enzymes: Secondary | ICD-10-CM | POA: Diagnosis present

## 2017-09-30 DIAGNOSIS — F411 Generalized anxiety disorder: Secondary | ICD-10-CM | POA: Diagnosis present

## 2017-09-30 DIAGNOSIS — F4322 Adjustment disorder with anxiety: Secondary | ICD-10-CM | POA: Diagnosis not present

## 2017-09-30 LAB — BLOOD GAS, ARTERIAL
Acid-Base Excess: 0.9 mmol/L (ref 0.0–2.0)
Bicarbonate: 29.6 mmol/L — ABNORMAL HIGH (ref 20.0–28.0)
DELIVERY SYSTEMS: POSITIVE
EXPIRATORY PAP: 5
FIO2: 0.5
INSPIRATORY PAP: 12
O2 Saturation: 99.3 %
PCO2 ART: 63 mmHg — AB (ref 32.0–48.0)
PH ART: 7.28 — AB (ref 7.350–7.450)
Patient temperature: 37
pO2, Arterial: 161 mmHg — ABNORMAL HIGH (ref 83.0–108.0)

## 2017-09-30 MED ORDER — MAGNESIUM SULFATE 2 GM/50ML IV SOLN
2.0000 g | Freq: Once | INTRAVENOUS | Status: AC
  Administered 2017-09-30: 2 g via INTRAVENOUS

## 2017-09-30 MED ORDER — MAGNESIUM SULFATE 2 GM/50ML IV SOLN
INTRAVENOUS | Status: AC
  Filled 2017-09-30: qty 50

## 2017-09-30 MED ORDER — ALBUTEROL SULFATE (2.5 MG/3ML) 0.083% IN NEBU: 10.0000 mg/h | INHALATION_SOLUTION | Freq: Once | RESPIRATORY_TRACT | Status: DC

## 2017-09-30 MED ORDER — ALBUTEROL SULFATE (2.5 MG/3ML) 0.083% IN NEBU
10.0000 mg | INHALATION_SOLUTION | Freq: Once | RESPIRATORY_TRACT | Status: AC
  Administered 2017-09-30: 10 mg via RESPIRATORY_TRACT
  Filled 2017-09-30: qty 12

## 2017-09-30 NOTE — ED Provider Notes (Signed)
[facility] Emergency Department Provider Note   ____________________________________________   First MD Initiated Contact with Patient 09/30/17 2329     (approximate)  I have reviewed the triage vital signs and the nursing notes.   HISTORY  Chief Complaint Respiratory Distress    HPI Bethany Villa is a 62 y.o. female brought to the ED from home via EMS with a chief complaint of respiratory distress.  Patient has COPD, not on home oxygen, who has been fighting cold-like symptoms for the past 2 weeks.  Shortness of breath increased this evening.  Patient had several nebulizer treatments at home.  EMS reports room air saturations mid 70%s. EMS gave 125mg  IV Solumedrol, 3 Albuterol nebs, 3 Duonebs and CPAP.  Arrives to the ED in moderate respiratory distress.  States she has been having fevers, productive cough and chest tightness.  Denies abdominal pain, nausea, vomiting, diarrhea.  Denies recent travel or trauma.   Past Medical History:  Diagnosis Date  . Anxiety   . Asthma   . Chronic pain    lower back  . COPD (chronic obstructive pulmonary disease) (HCC)   . Depression   . DJD (degenerative joint disease)   . Fibromyalgia   . GERD (gastroesophageal reflux disease)   . Headache   . Hyperlipidemia   . Lower extremity edema   . On home oxygen therapy    has not been on since 2016  . Panic attack   . Shortness of breath dyspnea   . Sleep disorder     Patient Active Problem List   Diagnosis Date Noted  . COPD exacerbation (HCC) 09/01/2016  . Elevated brain natriuretic peptide (BNP) level 09/01/2016  . LFT elevation 09/01/2016  . 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 Chronic obstructive pulmonary disease with acute lower respiratory infection (HCC)   . Acute respiratory failure with hypoxia and hypercapnia (HCC)   . Sepsis (HCC) 09/03/2015  . CAP (community acquired pneumonia) 09/01/2015  . SIRS (systemic inflammatory response  syndrome) (HCC) 07/24/2014  . COPD with acute exacerbation (HCC) 09/29/2013  . Insomnia 11/04/2012  . GAD (generalized anxiety disorder) 11/04/2012  . Depression 11/04/2012  . Fibromyalgia 11/04/2012  . Metabolic encephalopathy 10/03/2011  . COPD (chronic obstructive pulmonary disease) (HCC) 06/01/2011  . Smoking 05/19/2011  . Chronic pain disorder 05/19/2011  . Obesity 05/19/2011  . Hyperglycemia 05/19/2011    Past Surgical History:  Procedure Laterality Date  . ABDOMINAL SURGERY    . ANKLE SURGERY    . BACK SURGERY     lower  . CARPAL TUNNEL RELEASE    . CESAREAN SECTION    . KNEE SURGERY    . TONSILLECTOMY    . TOTAL KNEE ARTHROPLASTY Left 04/14/2016   Procedure: TOTAL KNEE ARTHROPLASTY;  Surgeon: Deeann Saint, MD;  Location: [facility] ORS;  Service: Orthopedics;  Laterality: Left;  . TUBAL LIGATION      Prior to Admission medications   Medication Sig Start Date End Date Taking? Authorizing Provider  albuterol (PROVENTIL) (2.5 MG/3ML) 0.083% nebulizer solution TAKE 3 ML BY NEBULIZATION Q 6 hrs prn FOR WHEEZING OR SOB Dx J44.9 Patient not taking: Reported on 10/01/2017 09/07/16   Kathlen Mody, MD  albuterol (VENTOLIN HFA) 108 (90 Base) MCG/ACT inhaler INHALE 2 PUFFS INTO THE LUNGS DAILY AS NEEDED FOR WHEEZING OR SHORTNESS OF BREATH. Patient not taking: Reported on 10/01/2017 06/08/17   Bennie Pierini, FNP  aspirin EC 325 MG tablet Take 1 tablet (325 mg total) by mouth  daily. Patient not taking: Reported on 10/01/2017 09/07/16   Kathlen ModyAkula, Vijaya, MD  atorvastatin (LIPITOR) 20 MG tablet TAKE 1 TABLET (20 MG TOTAL) BY MOUTH DAILY. Patient not taking: Reported on 10/01/2017 04/29/16   Bennie PieriniMartin, Mary-Margaret, FNP  doxylamine, Sleep, (SLEEP AID) 25 MG tablet Take 50 mg by mouth at bedtime.    [provider]  fluticasone (FLONASE) 50 MCG/ACT nasal spray Place 2 sprays into both nostrils daily. Patient not taking: Reported on 10/01/2017 11/19/14   Bennie PieriniMartin, Mary-Margaret, FNP  fluticasone  (FLONASE) 50 MCG/ACT nasal spray USE 2 SPRAYS INTO THE NOSE DAILY. Patient not taking: Reported on 10/01/2017 06/28/17   Bennie PieriniMartin, Mary-Margaret, FNP  furosemide (LASIX) 40 MG tablet Take 1 tablet (40 mg total) by mouth daily. Reported on 09/23/2015 Patient not taking: Reported on 10/01/2017 09/07/16   Kathlen ModyAkula, Vijaya, MD  guaiFENesin-dextromethorphan [facility](ROBITUSSIN DM) 100-10 MG/5ML syrup Take 5 mLs by mouth every 4 (four) hours as needed for cough. Patient not taking: Reported on 10/01/2017 09/07/16   Kathlen ModyAkula, Vijaya, MD  ipratropium-albuterol (DUONEB) 0.5-2.5 (3) MG/3ML SOLN Take 3 mLs by nebulization every 6 (six) hours as needed. Patient not taking: Reported on 10/01/2017 09/07/16   Kathlen ModyAkula, Vijaya, MD  levofloxacin (LEVAQUIN) 750 MG tablet Take 1 tablet (750 mg total) by mouth daily at 6 PM. Patient not taking: Reported on 10/01/2017 09/07/16   Kathlen ModyAkula, Vijaya, MD  methocarbamol (ROBAXIN) 500 MG tablet Take 500 mg by mouth 2 (two) times daily. 08/28/16   [provider]  nystatin (MYCOSTATIN) 100000 UNIT/ML suspension Take 5 mLs (500,000 Units total) by mouth 4 (four) times daily. Patient not taking: Reported on 10/01/2017 09/09/16   Bennie PieriniMartin, Mary-Margaret, FNP  pantoprazole (PROTONIX) 40 MG tablet Take 1 tablet (40 mg total) by mouth daily. Patient not taking: Reported on 10/01/2017 09/08/16   Kathlen ModyAkula, Vijaya, MD  predniSONE (DELTASONE) 20 MG tablet Prednisone 40 mg daily for 3 days followed by  Prednisone 20 mg daily for 3 days. Patient not taking: Reported on 10/01/2017 09/08/16   Kathlen ModyAkula, Vijaya, MD  PROAIR HFA 108 [phone](90 Base) MCG/ACT inhaler INHALE 2 PUFFS INTO THE LUNGS EVERY SIX HOURS AS NEEDED FOR WHEEZING OR SHORTNESS OF BREATH Patient not taking: Reported on 10/01/2017 10/01/16   Bennie PieriniMartin, Mary-Margaret, FNP  topiramate (TOPAMAX) 50 MG tablet Take 1 tablet (50 mg total) by mouth daily. Patient not taking: Reported on 10/01/2017 09/08/16   Kathlen ModyAkula, Vijaya, MD  traMADol (ULTRAM) 50 MG tablet TAKE TWO TABLETS BY MOUTH AT BEDTIME  08/10/16   [provider]    Allergies Gabapentin; Mobic [meloxicam]; Penicillins; and Sulfa antibiotics  Family History  Problem Relation Age of Onset  . Cancer Mother        beast    Social History Social History   Tobacco Use  . Smoking status: Current Every Day Smoker    Packs/day: 1.00    Years: 45.00    Pack years: 45.00    Types: Cigarettes  . Smokeless tobacco: Never Used  Substance Use Topics  . Alcohol use: Yes    Alcohol/week: 0.0 oz    Comment: rare  . Drug use: No    Review of Systems  Constitutional: Positive for fever/chills. Eyes: No visual changes. ENT: No sore throat. Cardiovascular: Denies chest pain. Respiratory: Positive for productive cough and shortness of breath. Gastrointestinal: No abdominal pain.  No nausea, no vomiting.  No diarrhea.  No constipation. Genitourinary: Negative for dysuria. Musculoskeletal: Negative for back pain. Skin: Negative for rash. Neurological: Negative for headaches,  focal weakness or numbness.   ____________________________________________   PHYSICAL EXAM:  VITAL SIGNS: ED Triage Vitals  Enc Vitals Group     BP      Pulse      Resp      Temp      Temp src      SpO2      Weight      Height      Head Circumference      Peak Flow      Pain Score      Pain Loc      Pain Edu?      Excl. in GC?     Constitutional: Alert and oriented. Well appearing and in moderate acute distress. Eyes: Conjunctivae are normal. PERRL. EOMI. Head: Atraumatic. Nose: No congestion/rhinnorhea. Mouth/Throat: Mucous membranes are moist.  Oropharynx non-erythematous. Neck: No stridor.   Cardiovascular: Tachycardic rate, regular rhythm. Grossly normal heart sounds.  Good peripheral circulation. Respiratory: Increased respiratory effort.  Retractions. Lungs with diffuse wheezing. Gastrointestinal: Soft and nontender. No distention. No abdominal bruits. No CVA tenderness. Musculoskeletal: No lower extremity  tenderness nor edema.  No joint effusions. Neurologic:  Normal speech and language. No gross focal neurologic deficits are appreciated.  Skin:  Skin is warm, dry and intact. No rash noted. Psychiatric: Mood and affect are normal. Speech and behavior are normal.  ____________________________________________   LABS (all labs ordered are listed, but only abnormal results are displayed)  Labs Reviewed  CBC WITH DIFFERENTIAL/PLATELET - Abnormal; Notable for the following components:      Result Value   Platelets 82 (*)    All other components within normal limits  COMPREHENSIVE METABOLIC PANEL - Abnormal; Notable for the following components:   Glucose, Bld 122 (*)    Calcium 8.3 (*)    AST 63 (*)    ALT 65 (*)    All other components within normal limits  TROPONIN I - Abnormal; Notable for the following components:   Troponin I 0.05 (*)    All other components within normal limits  BLOOD GAS, ARTERIAL - Abnormal; Notable for the following components:   pH, Arterial 7.28 (*)    pCO2 arterial 63 (*)    pO2, Arterial 161 (*)    Bicarbonate 29.6 (*)    All other components within normal limits  CULTURE, BLOOD (ROUTINE X 2)  CULTURE, BLOOD (ROUTINE X 2)  LACTIC ACID, PLASMA   ____________________________________________  EKG  ED ECG REPORT I, Pola Furno J, the attending physician, personally viewed and interpreted this ECG.   Date: 09/30/2017  EKG Time: 2332  Rate: 126  Rhythm: sinus tachycardia  Axis: Normal  Intervals:right bundle branch block  ST&T Change: Nonspecific  ____________________________________________  RADIOLOGY  ED MD interpretation: Viewed by me; no acute cardiopulmonary process.  Official radiology report(s): Dg Chest Port 1 View  Result Date: 09/30/2017 CLINICAL DATA:  Respiratory distress. EXAM: PORTABLE CHEST 1 VIEW COMPARISON:  Radiographs of September 03, 2016. FINDINGS: The heart size and mediastinal contours are within normal limits. Both lungs are  clear. No pneumothorax or pleural effusion is noted. The visualized skeletal structures are unremarkable. IMPRESSION: No acute cardiopulmonary abnormality seen. Electronically Signed   By: Lupita Raider, M.D.   On: 09/30/2017 23:47    ____________________________________________   PROCEDURES  Procedure(s) performed: None  Procedures  Critical Care performed: Yes, see critical care note(s)   CRITICAL CARE Performed by: Irean Hong   Total critical care time: 45 minutes  Critical care  time was exclusive of separately billable procedures and treating other patients.  Critical care was necessary to treat or prevent imminent or life-threatening deterioration.  Critical care was time spent personally by me on the following activities: development of treatment plan with patient and/or surrogate as well as nursing, discussions with consultants, evaluation of patient's response to treatment, examination of patient, obtaining history from patient or surrogate, ordering and performing treatments and interventions, ordering and review of laboratory studies, ordering and review of radiographic studies, pulse oximetry and re-evaluation of patient's condition.  ____________________________________________   INITIAL IMPRESSION / ASSESSMENT AND PLAN / ED COURSE  As part of my medical decision making, I reviewed the following data within the electronic MEDICAL RECORD NUMBER Nursing notes reviewed and incorporated, Labs reviewed, EKG interpreted, Old chart reviewed, Radiograph reviewed, Discussed with admitting physician Dr. Tobi Bastos and Notes from prior ED visits.   62 year old female with COPD who presents with moderate respiratory distress. Differential includes, but is not limited to, viral syndrome, bronchitis including COPD exacerbation, pneumonia, reactive airway disease including asthma, CHF including exacerbation with or without pulmonary/interstitial edema, pneumothorax, ACS, thoracic trauma,  and pulmonary embolism.  Patient has a history of intubation.  Will place on BiPAP, administer continuous albuterol nebulizer, add magnesium.  Will check screening lab work, chest x-ray.  Anticipate hospitalization.  Clinical Course as of Oct 01 45  Sat Oct 01, 2017  0026 Updated patient on laboratory results thus far.  She is asking for anxiolytic.  Will order IV Ativan.  Will discuss with hospitalist to evaluate patient in the emergency department for admission.  [JS]    Clinical Course User Index [JS] Irean Hong, MD     ____________________________________________   FINAL CLINICAL IMPRESSION(S) / ED DIAGNOSES  Final diagnoses:  COPD exacerbation (HCC)  Respiratory distress  Acute respiratory failure with hypoxia (HCC)  Elevated troponin     ED Discharge Orders    None       Note:  This document was prepared using Dragon voice recognition software and may include unintentional dictation errors.    Irean Hong, MD 10/01/17 [phone]

## 2017-09-30 NOTE — ED Triage Notes (Signed)
Pt arrives via [facility] with c/o respiratory distress. Pt reports hx of COPD. Per EMS, pt has had 125 mg solumedrol, 3 duonebs, and 3 albuterol treatments. EMS reports no audible lung sounds initially but they are diminished at this time per EMS. Pt was 87% RA upon EMS arrival on scene.

## 2017-10-01 ENCOUNTER — Encounter: Payer: Self-pay | Admitting: Internal Medicine

## 2017-10-01 DIAGNOSIS — F410 Panic disorder [episodic paroxysmal anxiety] without agoraphobia: Secondary | ICD-10-CM | POA: Diagnosis present

## 2017-10-01 DIAGNOSIS — Z7982 Long term (current) use of aspirin: Secondary | ICD-10-CM | POA: Diagnosis not present

## 2017-10-01 DIAGNOSIS — G8929 Other chronic pain: Secondary | ICD-10-CM | POA: Diagnosis present

## 2017-10-01 DIAGNOSIS — M797 Fibromyalgia: Secondary | ICD-10-CM | POA: Diagnosis present

## 2017-10-01 DIAGNOSIS — J209 Acute bronchitis, unspecified: Secondary | ICD-10-CM | POA: Diagnosis present

## 2017-10-01 DIAGNOSIS — J440 Chronic obstructive pulmonary disease with acute lower respiratory infection: Secondary | ICD-10-CM | POA: Diagnosis present

## 2017-10-01 DIAGNOSIS — Z515 Encounter for palliative care: Secondary | ICD-10-CM | POA: Diagnosis not present

## 2017-10-01 DIAGNOSIS — R45851 Suicidal ideations: Secondary | ICD-10-CM | POA: Diagnosis not present

## 2017-10-01 DIAGNOSIS — R748 Abnormal levels of other serum enzymes: Secondary | ICD-10-CM | POA: Diagnosis present

## 2017-10-01 DIAGNOSIS — I493 Ventricular premature depolarization: Secondary | ICD-10-CM | POA: Diagnosis not present

## 2017-10-01 DIAGNOSIS — J9601 Acute respiratory failure with hypoxia: Secondary | ICD-10-CM

## 2017-10-01 DIAGNOSIS — J441 Chronic obstructive pulmonary disease with (acute) exacerbation: Principal | ICD-10-CM

## 2017-10-01 DIAGNOSIS — Z96652 Presence of left artificial knee joint: Secondary | ICD-10-CM | POA: Diagnosis present

## 2017-10-01 DIAGNOSIS — J9690 Respiratory failure, unspecified, unspecified whether with hypoxia or hypercapnia: Secondary | ICD-10-CM | POA: Diagnosis present

## 2017-10-01 DIAGNOSIS — F17210 Nicotine dependence, cigarettes, uncomplicated: Secondary | ICD-10-CM | POA: Diagnosis present

## 2017-10-01 DIAGNOSIS — Z915 Personal history of self-harm: Secondary | ICD-10-CM | POA: Diagnosis not present

## 2017-10-01 DIAGNOSIS — R0600 Dyspnea, unspecified: Secondary | ICD-10-CM | POA: Diagnosis not present

## 2017-10-01 DIAGNOSIS — F329 Major depressive disorder, single episode, unspecified: Secondary | ICD-10-CM | POA: Diagnosis present

## 2017-10-01 DIAGNOSIS — Z79891 Long term (current) use of opiate analgesic: Secondary | ICD-10-CM | POA: Diagnosis not present

## 2017-10-01 DIAGNOSIS — G4700 Insomnia, unspecified: Secondary | ICD-10-CM | POA: Diagnosis present

## 2017-10-01 DIAGNOSIS — R945 Abnormal results of liver function studies: Secondary | ICD-10-CM | POA: Diagnosis present

## 2017-10-01 DIAGNOSIS — J189 Pneumonia, unspecified organism: Secondary | ICD-10-CM | POA: Diagnosis not present

## 2017-10-01 DIAGNOSIS — J9602 Acute respiratory failure with hypercapnia: Secondary | ICD-10-CM | POA: Diagnosis not present

## 2017-10-01 DIAGNOSIS — G4733 Obstructive sleep apnea (adult) (pediatric): Secondary | ICD-10-CM | POA: Diagnosis present

## 2017-10-01 DIAGNOSIS — K219 Gastro-esophageal reflux disease without esophagitis: Secondary | ICD-10-CM | POA: Diagnosis present

## 2017-10-01 DIAGNOSIS — E785 Hyperlipidemia, unspecified: Secondary | ICD-10-CM | POA: Diagnosis present

## 2017-10-01 DIAGNOSIS — F4322 Adjustment disorder with anxiety: Secondary | ICD-10-CM | POA: Diagnosis present

## 2017-10-01 DIAGNOSIS — Z7951 Long term (current) use of inhaled steroids: Secondary | ICD-10-CM | POA: Diagnosis not present

## 2017-10-01 DIAGNOSIS — Z23 Encounter for immunization: Secondary | ICD-10-CM | POA: Diagnosis present

## 2017-10-01 LAB — COMPREHENSIVE METABOLIC PANEL
ALK PHOS: 116 U/L (ref 38–126)
ALT: 65 U/L — AB (ref 14–54)
ANION GAP: 7 (ref 5–15)
AST: 63 U/L — ABNORMAL HIGH (ref 15–41)
Albumin: 3.5 g/dL (ref 3.5–5.0)
BILIRUBIN TOTAL: 0.7 mg/dL (ref 0.3–1.2)
BUN: 11 mg/dL (ref 6–20)
CALCIUM: 8.3 mg/dL — AB (ref 8.9–10.3)
CO2: 27 mmol/L (ref 22–32)
CREATININE: 0.57 mg/dL (ref 0.44–1.00)
Chloride: 105 mmol/L (ref 101–111)
GFR calc non Af Amer: >60 mL/min (ref >60)
Glucose, Bld: 122 mg/dL — ABNORMAL HIGH (ref 65–99)
Potassium: 3.6 mmol/L (ref 3.5–5.1)
Sodium: 139 mmol/L (ref 135–145)
Total Protein: 7.5 g/dL (ref 6.5–8.1)

## 2017-10-01 LAB — BLOOD GAS, ARTERIAL
Acid-Base Excess: 0.4 mmol/L (ref 0.0–2.0)
BICARBONATE: 27.4 mmol/L (ref 20.0–28.0)
FIO2: 0.32
O2 SAT: 89.6 %
PCO2 ART: 52 mmHg — AB (ref 32.0–48.0)
PH ART: 7.33 — AB (ref 7.350–7.450)
Patient temperature: 37
pO2, Arterial: 62 mmHg — ABNORMAL LOW (ref 83.0–108.0)

## 2017-10-01 LAB — TROPONIN I
TROPONIN I: 0.03 ng/mL — AB (ref <0.03)
TROPONIN I: 0.05 ng/mL — AB (ref <0.03)

## 2017-10-01 LAB — CBC WITH DIFFERENTIAL/PLATELET
Basophils Absolute: 0 10*3/uL (ref 0–0.1)
Basophils Relative: 0 %
EOS ABS: 0.1 10*3/uL (ref 0–0.7)
Eosinophils Relative: 1 %
HCT: 46.8 % (ref 35.0–47.0)
HEMOGLOBIN: 15.2 g/dL (ref 12.0–16.0)
LYMPHS ABS: 1.5 10*3/uL (ref 1.0–3.6)
LYMPHS PCT: 26 %
MCH: 30 pg (ref 26.0–34.0)
MCHC: 32.5 g/dL (ref 32.0–36.0)
MCV: 92.4 fL (ref 80.0–100.0)
MONOS PCT: 12 %
Monocytes Absolute: 0.7 10*3/uL (ref 0.2–0.9)
NEUTROS PCT: 61 %
Neutro Abs: 3.6 10*3/uL (ref 1.4–6.5)
Platelets: 82 10*3/uL — ABNORMAL LOW (ref 150–440)
RBC: 5.07 MIL/uL (ref 3.80–5.20)
RDW: 14.4 % (ref 11.5–14.5)
WBC: 5.9 10*3/uL (ref 3.6–11.0)

## 2017-10-01 LAB — MAGNESIUM
MAGNESIUM: 3.2 mg/dL — AB (ref 1.7–2.4)
Magnesium: 2.1 mg/dL (ref 1.7–2.4)

## 2017-10-01 LAB — BASIC METABOLIC PANEL
ANION GAP: 8 (ref 5–15)
BUN: 11 mg/dL (ref 6–20)
CALCIUM: 8.3 mg/dL — AB (ref 8.9–10.3)
CO2: 27 mmol/L (ref 22–32)
CREATININE: 0.67 mg/dL (ref 0.44–1.00)
Chloride: 105 mmol/L (ref 101–111)
Glucose, Bld: 157 mg/dL — ABNORMAL HIGH (ref 65–99)
Potassium: 3.7 mmol/L (ref 3.5–5.1)
Sodium: 140 mmol/L (ref 135–145)

## 2017-10-01 LAB — CBC
HCT: 44.5 % (ref 35.0–47.0)
Hemoglobin: 14.5 g/dL (ref 12.0–16.0)
MCH: 30.2 pg (ref 26.0–34.0)
MCHC: 32.6 g/dL (ref 32.0–36.0)
MCV: 92.8 fL (ref 80.0–100.0)
PLATELETS: 81 10*3/uL — AB (ref 150–440)
RBC: 4.8 MIL/uL (ref 3.80–5.20)
RDW: 14.4 % (ref 11.5–14.5)
WBC: 4.5 10*3/uL (ref 3.6–11.0)

## 2017-10-01 LAB — GLUCOSE, CAPILLARY: GLUCOSE-CAPILLARY: 121 mg/dL — AB (ref 65–99)

## 2017-10-01 LAB — HEPATIC FUNCTION PANEL
ALBUMIN: 3.4 g/dL — AB (ref 3.5–5.0)
ALT: 61 U/L — ABNORMAL HIGH (ref 14–54)
AST: 58 U/L — ABNORMAL HIGH (ref 15–41)
Alkaline Phosphatase: 110 U/L (ref 38–126)
BILIRUBIN INDIRECT: 0.5 mg/dL (ref 0.3–0.9)
Bilirubin, Direct: 0.3 mg/dL (ref 0.1–0.5)
TOTAL PROTEIN: 7.2 g/dL (ref 6.5–8.1)
Total Bilirubin: 0.8 mg/dL (ref 0.3–1.2)

## 2017-10-01 LAB — AMYLASE: AMYLASE: 39 U/L (ref 28–100)

## 2017-10-01 LAB — INFLUENZA PANEL BY PCR (TYPE A & B)
Influenza A By PCR: NEGATIVE
Influenza B By PCR: NEGATIVE

## 2017-10-01 LAB — LACTIC ACID, PLASMA: Lactic Acid, Venous: 0.8 mmol/L (ref 0.5–1.9)

## 2017-10-01 LAB — LIPASE, BLOOD: Lipase: 18 U/L (ref 11–51)

## 2017-10-01 LAB — MRSA PCR SCREENING: MRSA BY PCR: POSITIVE — AB

## 2017-10-01 MED ORDER — ONDANSETRON HCL 4 MG/2ML IJ SOLN
4.0000 mg | Freq: Four times a day (QID) | INTRAMUSCULAR | Status: DC | PRN
Start: 2017-10-01 — End: 2017-10-08

## 2017-10-01 MED ORDER — LEVOFLOXACIN IN D5W 750 MG/150ML IV SOLN
750.0000 mg | INTRAVENOUS | Status: DC
Start: 2017-10-01 — End: 2017-10-03
  Administered 2017-10-01 – 2017-10-03 (×3): 750 mg via INTRAVENOUS
  Filled 2017-10-01 (×5): qty 150

## 2017-10-01 MED ORDER — METHYLPREDNISOLONE SODIUM SUCC 125 MG IJ SOLR
60.0000 mg | Freq: Four times a day (QID) | INTRAMUSCULAR | Status: DC
  Administered 2017-10-01 – 2017-10-04 (×14): 60 mg via INTRAVENOUS
  Filled 2017-10-01 (×13): qty 2

## 2017-10-01 MED ORDER — PANTOPRAZOLE SODIUM 40 MG PO TBEC
40.0000 mg | DELAYED_RELEASE_TABLET | Freq: Every day | ORAL | Status: DC
  Administered 2017-10-01 – 2017-10-07 (×7): 40 mg via ORAL
  Filled 2017-10-01 (×7): qty 1

## 2017-10-01 MED ORDER — SODIUM CHLORIDE 0.9% FLUSH: 3.0000 mL | INTRAVENOUS | Status: DC | PRN

## 2017-10-01 MED ORDER — ENOXAPARIN SODIUM 40 MG/0.4ML ~~LOC~~ SOLN
40.0000 mg | SUBCUTANEOUS | Status: DC
  Administered 2017-10-01 – 2017-10-07 (×7): 40 mg via SUBCUTANEOUS
  Filled 2017-10-01 (×7): qty 0.4

## 2017-10-01 MED ORDER — IPRATROPIUM-ALBUTEROL 0.5-2.5 (3) MG/3ML IN SOLN
3.0000 mL | Freq: Four times a day (QID) | RESPIRATORY_TRACT | Status: DC
  Administered 2017-10-01 (×2): 3 mL via RESPIRATORY_TRACT
  Filled 2017-10-01 (×3): qty 3

## 2017-10-01 MED ORDER — MAGNESIUM SULFATE 2 GM/50ML IV SOLN: 2.0000 g | Freq: Once | INTRAVENOUS | Status: DC

## 2017-10-01 MED ORDER — IPRATROPIUM-ALBUTEROL 0.5-2.5 (3) MG/3ML IN SOLN
3.0000 mL | RESPIRATORY_TRACT | Status: DC
  Administered 2017-10-01 – 2017-10-07 (×35): 3 mL via RESPIRATORY_TRACT
  Filled 2017-10-01 (×39): qty 3

## 2017-10-01 MED ORDER — ACETAMINOPHEN 325 MG PO TABS
ORAL_TABLET | ORAL | Status: AC
  Administered 2017-10-02: 650 mg via ORAL
  Filled 2017-10-01: qty 2

## 2017-10-01 MED ORDER — ATORVASTATIN CALCIUM 20 MG PO TABS
20.0000 mg | ORAL_TABLET | Freq: Every day | ORAL | Status: DC
  Administered 2017-10-01 – 2017-10-07 (×7): 20 mg via ORAL
  Filled 2017-10-01 (×6): qty 1

## 2017-10-01 MED ORDER — ACETAMINOPHEN 650 MG RE SUPP: 650.0000 mg | Freq: Four times a day (QID) | RECTAL | Status: DC | PRN

## 2017-10-01 MED ORDER — GUAIFENESIN-CODEINE 100-10 MG/5ML PO SOLN
10.0000 mL | ORAL | Status: DC | PRN
  Administered 2017-10-01 – 2017-10-05 (×6): 10 mL via ORAL
  Filled 2017-10-01 (×8): qty 10

## 2017-10-01 MED ORDER — METHYLPREDNISOLONE SODIUM SUCC 125 MG IJ SOLR
125.0000 mg | Freq: Four times a day (QID) | INTRAMUSCULAR | Status: DC
  Administered 2017-10-01: 125 mg via INTRAVENOUS
  Filled 2017-10-01: qty 2

## 2017-10-01 MED ORDER — ACETAMINOPHEN 325 MG PO TABS
650.0000 mg | ORAL_TABLET | Freq: Four times a day (QID) | ORAL | Status: DC | PRN
  Administered 2017-10-01 – 2017-10-02 (×2): 650 mg via ORAL
  Filled 2017-10-01: qty 2

## 2017-10-01 MED ORDER — LORAZEPAM 2 MG/ML IJ SOLN
0.5000 mg | Freq: Once | INTRAMUSCULAR | Status: AC
  Administered 2017-10-01: 0.5 mg via INTRAVENOUS
  Filled 2017-10-01: qty 1

## 2017-10-01 MED ORDER — MUPIROCIN 2 % EX OINT
1.0000 "application " | TOPICAL_OINTMENT | Freq: Two times a day (BID) | CUTANEOUS | Status: AC
  Administered 2017-10-01 – 2017-10-05 (×10): 1 via NASAL
  Filled 2017-10-01: qty 22

## 2017-10-01 MED ORDER — CHLORHEXIDINE GLUCONATE CLOTH 2 % EX PADS
6.0000 | MEDICATED_PAD | Freq: Every day | CUTANEOUS | Status: AC
  Administered 2017-10-01 – 2017-10-05 (×5): 6 via TOPICAL

## 2017-10-01 MED ORDER — NICOTINE 21 MG/24HR TD PT24
21.0000 mg | MEDICATED_PATCH | Freq: Every day | TRANSDERMAL | Status: DC
  Administered 2017-10-01 – 2017-10-07 (×7): 21 mg via TRANSDERMAL
  Filled 2017-10-01 (×7): qty 1

## 2017-10-01 MED ORDER — BUDESONIDE 0.25 MG/2ML IN SUSP
0.2500 mg | Freq: Two times a day (BID) | RESPIRATORY_TRACT | Status: DC
  Administered 2017-10-01 – 2017-10-07 (×13): 0.25 mg via RESPIRATORY_TRACT
  Filled 2017-10-01 (×13): qty 2

## 2017-10-01 MED ORDER — OLANZAPINE 10 MG PO TBDP
10.0000 mg | ORAL_TABLET | Freq: Once | ORAL | Status: AC
  Administered 2017-10-01: 10 mg via ORAL
  Filled 2017-10-01: qty 1

## 2017-10-01 MED ORDER — SODIUM CHLORIDE 0.9% FLUSH
3.0000 mL | Freq: Two times a day (BID) | INTRAVENOUS | Status: DC
  Administered 2017-10-01 – 2017-10-03 (×7): 3 mL via INTRAVENOUS

## 2017-10-01 MED ORDER — TOPIRAMATE 25 MG PO TABS
50.0000 mg | ORAL_TABLET | Freq: Every day | ORAL | Status: DC
  Administered 2017-10-01 – 2017-10-07 (×7): 50 mg via ORAL
  Filled 2017-10-01 (×8): qty 2

## 2017-10-01 MED ORDER — MORPHINE SULFATE (PF) 2 MG/ML IV SOLN
2.0000 mg | INTRAVENOUS | Status: DC | PRN
  Administered 2017-10-01 – 2017-10-04 (×16): 2 mg via INTRAVENOUS
  Filled 2017-10-01 (×16): qty 1

## 2017-10-01 MED ORDER — ALPRAZOLAM 0.25 MG PO TABS
0.2500 mg | ORAL_TABLET | Freq: Three times a day (TID) | ORAL | Status: DC | PRN
  Administered 2017-10-01 – 2017-10-04 (×9): 0.25 mg via ORAL
  Filled 2017-10-01 (×9): qty 1

## 2017-10-01 MED ORDER — BENZONATATE 100 MG PO CAPS
100.0000 mg | ORAL_CAPSULE | Freq: Three times a day (TID) | ORAL | Status: AC
  Administered 2017-10-01 – 2017-10-02 (×6): 100 mg via ORAL
  Filled 2017-10-01 (×6): qty 1

## 2017-10-01 MED ORDER — SODIUM CHLORIDE 0.9 % IV SOLN: 250.0000 mL | INTRAVENOUS | Status: DC | PRN

## 2017-10-01 MED ORDER — SENNOSIDES-DOCUSATE SODIUM 8.6-50 MG PO TABS
1.0000 | ORAL_TABLET | Freq: Every evening | ORAL | Status: DC | PRN
Start: 2017-10-01 — End: 2017-10-08

## 2017-10-01 MED ORDER — ONDANSETRON HCL 4 MG PO TABS: 4.0000 mg | ORAL_TABLET | Freq: Four times a day (QID) | ORAL | Status: DC | PRN

## 2017-10-01 NOTE — Progress Notes (Signed)
Patient placed on nasal cannula at this time. Currently on 3L at this time and tolerating it well.

## 2017-10-01 NOTE — Progress Notes (Signed)
Pharmacy Antibiotic Note  Bethany Villa is a 62 y.o. female admitted on 09/30/2017 with COPD exacerbation.  Pharmacy has been consulted for levaquin dosing.  Plan: Pharmacy has been consulted for levaquin dosing in COPD exacerbation  Will dose levaquin 750 mg daily for 3 - 5 days. 02/01 EKG QTc 477 WNL  Height: 5\' 3"  (160 cm) Weight: 189 lb (85.7 kg) IBW/kg (Calculated) : 52.4  Temp (24hrs), Avg:98.5 F (36.9 C), Min:98.5 F (36.9 C), Max:98.5 F (36.9 C)  Recent Labs  Lab 09/30/17 2330  WBC 5.9  CREATININE 0.57  LATICACIDVEN 0.8    Estimated Creatinine Clearance: 77.6 mL/min (by C-G formula based on SCr of 0.57 mg/dL).    Allergies  Allergen Reactions  . Gabapentin Shortness Of Breath and Rash  . Mobic [Meloxicam] Anaphylaxis  . Penicillins Shortness Of Breath and Swelling    Has patient had a PCN reaction causing immediate rash, facial/tongue/throat swelling, SOB or lightheadedness with hypotension: unknown Has patient had a PCN reaction causing severe rash involving mucus membranes or skin necrosis: {unknown Has patient had a PCN reaction that required hospitalization {unknown Has patient had a PCN reaction occurring within the last 10 years: no If all of the above answers are "NO", then may proceed with Cephalosporin use.  . Sulfa Antibiotics Shortness Of Breath and Swelling    Thank you for allowing pharmacy to be a part of this patient's care.  Thomasene Rippleavid Faaris Arizpe, PharmD, BCPS Clinical Pharmacist 10/01/2017

## 2017-10-01 NOTE — Consult Note (Signed)
PULMONARY / CRITICAL CARE MEDICINE   Name: Bethany Villa MRN: [id number] DOB: 03/25/1956    ADMISSION DATE:  09/30/2017   CONSULTATION DATE: 09/30/2017  REFERRING MD: Dr. Tobi BastosPyreddy  Reason: Acute hypoxic respiratory failure  HISTORY OF PRESENT ILLNESS:   This is a 62 year old Caucasian female, current smoker, history of COPD, who presents with progressive worsening shortness of breath patient states that symptoms started about 2 weeks ago and have progressively gotten worse.  She reports being around sick contacts but no confirmed cases of flu.  She states her symptoms got unbearable today hence she called EMS.  When EMS arrived, her SPO2 was 70% on room air.  She was given IV steroids nebs and placed on CPAP and transported to the ED.  At the ED she continued to be tachypneic and was placed on BiPAP.  She reports significant improvement in symptoms. She continues to smoke .  She reports a hacking persistent productive cough.  Cough is productive of yellow to green sputum.  She denies any fever chills nausea and vomiting.  PAST MEDICAL HISTORY :  She  has a past medical history of Anxiety, Asthma, Chronic pain, COPD (chronic obstructive pulmonary disease) (HCC), Depression, DJD (degenerative joint disease), Fibromyalgia, GERD (gastroesophageal reflux disease), Headache, Hyperlipidemia, Lower extremity edema, On home oxygen therapy, Panic attack, Shortness of breath dyspnea, and Sleep disorder.  PAST SURGICAL HISTORY: She  has a past surgical history that includes Abdominal surgery; Carpal tunnel release; Ankle surgery; Knee surgery; Cesarean section; Tonsillectomy; Tubal ligation; Back surgery; and Total knee arthroplasty (Left, 04/14/2016).  Allergies  Allergen Reactions  . Gabapentin Shortness Of Breath and Rash  . Mobic [Meloxicam] Anaphylaxis  . Penicillins Shortness Of Breath and Swelling    Has patient had a PCN reaction causing immediate rash, facial/tongue/throat swelling, SOB or  lightheadedness with hypotension: unknown Has patient had a PCN reaction causing severe rash involving mucus membranes or skin necrosis: {unknown Has patient had a PCN reaction that required hospitalization {unknown Has patient had a PCN reaction occurring within the last 10 years: no If all of the above answers are "NO", then may proceed with Cephalosporin use.  . Sulfa Antibiotics Shortness Of Breath and Swelling    No current facility-administered medications on file prior to encounter.    Current Outpatient Medications on File Prior to Encounter  Medication Sig  . albuterol (PROVENTIL) (2.5 MG/3ML) 0.083% nebulizer solution TAKE 3 ML BY NEBULIZATION Q 6 hrs prn FOR WHEEZING OR SOB Dx J44.9 (Patient not taking: Reported on 10/01/2017)  . albuterol (VENTOLIN HFA) 108 (90 Base) MCG/ACT inhaler INHALE 2 PUFFS INTO THE LUNGS DAILY AS NEEDED FOR WHEEZING OR SHORTNESS OF BREATH. (Patient not taking: Reported on 10/01/2017)  . aspirin EC 325 MG tablet Take 1 tablet (325 mg total) by mouth daily. (Patient not taking: Reported on 10/01/2017)  . atorvastatin (LIPITOR) 20 MG tablet TAKE 1 TABLET (20 MG TOTAL) BY MOUTH DAILY. (Patient not taking: Reported on 10/01/2017)  . doxylamine, Sleep, (SLEEP AID) 25 MG tablet Take 50 mg by mouth at bedtime.  . fluticasone (FLONASE) 50 MCG/ACT nasal spray Place 2 sprays into both nostrils daily. (Patient not taking: Reported on 10/01/2017)  . fluticasone (FLONASE) 50 MCG/ACT nasal spray USE 2 SPRAYS INTO THE NOSE DAILY. (Patient not taking: Reported on 10/01/2017)  . furosemide (LASIX) 40 MG tablet Take 1 tablet (40 mg total) by mouth daily. Reported on 09/23/2015 (Patient not taking: Reported on 10/01/2017)  . guaiFENesin-dextromethorphan (ROBITUSSIN DM) 100-10 MG/5ML syrup  Take 5 mLs by mouth every 4 (four) hours as needed for cough. (Patient not taking: Reported on 10/01/2017)  . ipratropium-albuterol (DUONEB) 0.5-2.5 (3) MG/3ML SOLN Take 3 mLs by nebulization every 6 (six)  hours as needed. (Patient not taking: Reported on 10/01/2017)  . levofloxacin (LEVAQUIN) 750 MG tablet Take 1 tablet (750 mg total) by mouth daily at 6 PM. (Patient not taking: Reported on 10/01/2017)  . methocarbamol (ROBAXIN) 500 MG tablet Take 500 mg by mouth 2 (two) times daily.  Marland Kitchen nystatin (MYCOSTATIN) 100000 UNIT/ML suspension Take 5 mLs (500,000 Units total) by mouth 4 (four) times daily. (Patient not taking: Reported on 10/01/2017)  . pantoprazole (PROTONIX) 40 MG tablet Take 1 tablet (40 mg total) by mouth daily. (Patient not taking: Reported on 10/01/2017)  . predniSONE (DELTASONE) 20 MG tablet Prednisone 40 mg daily for 3 days followed by  Prednisone 20 mg daily for 3 days. (Patient not taking: Reported on 10/01/2017)  . PROAIR HFA 108 (90 Base) MCG/ACT inhaler INHALE 2 PUFFS INTO THE LUNGS EVERY SIX HOURS AS NEEDED FOR WHEEZING OR SHORTNESS OF BREATH (Patient not taking: Reported on 10/01/2017)  . topiramate (TOPAMAX) 50 MG tablet Take 1 tablet (50 mg total) by mouth daily. (Patient not taking: Reported on 10/01/2017)  . traMADol (ULTRAM) 50 MG tablet TAKE TWO TABLETS BY MOUTH AT BEDTIME    FAMILY HISTORY:  Her indicated that her mother is deceased. She indicated that her father is deceased.   SOCIAL HISTORY: She  reports that she has been smoking cigarettes.  She has a 45.00 pack-year smoking history. she has never used smokeless tobacco. She reports that she drinks alcohol. She reports that she does not use drugs.  REVIEW OF SYSTEMS:   All systems reviewed.  Pertinent positives include shortness of breath and wheezing and a productive cough.  All other systems are negative  SUBJECTIVE:   VITAL SIGNS: BP 106/72   Pulse 93   Temp 97.7 F (36.5 C) (Axillary)   Resp (!) 21   Ht 5\' 3"  (1.6 m)   Wt 85.3 kg (188 lb 0.8 oz)   SpO2 100%   BMI 33.31 kg/m   HEMODYNAMICS:    VENTILATOR SETTINGS: FiO2 (%):  [50 %] 50 %  INTAKE / OUTPUT: No intake/output data recorded.  PHYSICAL  EXAMINATION: General: Mild respiratory distress Neuro: Alert and oriented x4, no deficits HEENT: PERRLA, trachea midline Cardiovascular: Apical pulse regular, S1-S2, no murmur regurg or gallop Lungs: Mild increase in work of breathing, bilateral breath sounds with diffuse expiratory wheezes and rhonchi in all lung fields Abdomen: Nondistended, normal bowel sounds in all 4 quadrants Musculoskeletal: No joint deformity Skin: Warm dry  LABS:  BMET Recent Labs  Lab 09/30/17 2330  NA 139  K 3.6  CL 105  CO2 27  BUN 11  CREATININE 0.57  GLUCOSE 122*    Electrolytes Recent Labs  Lab 09/30/17 2330  CALCIUM 8.3*    CBC Recent Labs  Lab 09/30/17 2330  WBC 5.9  HGB 15.2  HCT 46.8  PLT 82*    Coag's No results for input(s): APTT, INR in the last 168 hours.  Sepsis Markers Recent Labs  Lab 09/30/17 2330  LATICACIDVEN 0.8    ABG Recent Labs  Lab 09/30/17 2330  PHART 7.28*  PCO2ART 63*  PO2ART 161*    Liver Enzymes Recent Labs  Lab 09/30/17 2330  AST 63*  ALT 65*  ALKPHOS 116  BILITOT 0.7  ALBUMIN 3.5    Cardiac  Enzymes Recent Labs  Lab 09/30/17 2330  TROPONINI 0.05*    Glucose Recent Labs  Lab 10/01/17 0220  GLUCAP 121*    Imaging Dg Chest Port 1 View  Result Date: 09/30/2017 CLINICAL DATA:  Respiratory distress. EXAM: PORTABLE CHEST 1 VIEW COMPARISON:  Radiographs of September 03, 2016. FINDINGS: The heart size and mediastinal contours are within normal limits. Both lungs are clear. No pneumothorax or pleural effusion is noted. The visualized skeletal structures are unremarkable. IMPRESSION: No acute cardiopulmonary abnormality seen. Electronically Signed   By: Lupita Raider, M.D.   On: 09/30/2017 23:47   STUDIES:  None  CULTURES: Cultures x2  ANTIBIOTICS: Levaquin  SIGNIFICANT EVENTS: 02/01: Admitted  LINES/TUBES: Peripheral IVs  DISCUSSION: 62 year old female current smoker presenting with acute COPD exacerbation.  Will  rule out influenza  ASSESSMENT Acute hypoxic respiratory failure requiring BiPAP Acute COPD exacerbation Questionable upper respiratory tract infection History of obstructive sleep apnea-was on home oxygen but reports being off oxygen for a while now History of depression, chronic pain, fibromyalgia, hyperlipidemia, and anxiety   PLAN Now off BiPAP and doing well on 3 L nasal cannula Continue supplemental oxygen and monitor her SPO2 Keep SPO2 greater than 88% IV steroids Nebulized bronchodilators and inhaled steroids Antibiotics as above Resume all home medications Chest x-ray and ABG as needed GI and DVT prophylaxis  Patient is doing well off BiPAP.  With transfer out of the ICU FAMILY  - Updates: Patient updated on current treatment plan  - Inter-disciplinary family meet or Palliative Care meeting due by:  day 7  Magdalene [facility] ANP-BC Pulmonary and Critical Care Medicine [facility] Pager [phone] or [phone]  NB: This document was prepared using Dragon voice recognition software and may include unintentional dictation errors.   10/01/2017, 4:46 AM

## 2017-10-01 NOTE — Progress Notes (Signed)
[facility] [place] at [facility]   PATIENT NAME: Bethany Villa    MR#:  [id number]  DATE OF BIRTH:  11/21/1955  SUBJECTIVE:   Came in with increasing shortness of breath and wheezing.  Patient was placed on BiPAP.  Feels a little better.  Although some wheezing present.  Trying to order breakfast.  She just got off BiPAP right now. No family in the REVIEW OF SYSTEMS:   Review of Systems  Constitutional: Negative for chills, fever and weight loss.  HENT: Negative for ear discharge, ear pain and nosebleeds.   Eyes: Negative for blurred vision, pain and discharge.  Respiratory: Negative for sputum production, shortness of breath, wheezing and stridor.   Cardiovascular: Negative for chest pain, palpitations, orthopnea and PND.  Gastrointestinal: Negative for abdominal pain, diarrhea, nausea and vomiting.  Genitourinary: Negative for frequency and urgency.  Musculoskeletal: Negative for back pain and joint pain.  Neurological: Negative for sensory change, speech change, focal weakness and weakness.  Psychiatric/Behavioral: Negative for depression and hallucinations. The patient is not nervous/anxious.    Tolerating Diet:yes Tolerating PT: pending  DRUG ALLERGIES:   Allergies  Allergen Reactions  . Gabapentin Shortness Of Breath and Rash  . Mobic [Meloxicam] Anaphylaxis  . Penicillins Shortness Of Breath and Swelling    Has patient had a PCN reaction causing immediate rash, facial/tongue/throat swelling, SOB or lightheadedness with hypotension: unknown Has patient had a PCN reaction causing severe rash involving mucus membranes or skin necrosis: {unknown Has patient had a PCN reaction that required hospitalization {unknown Has patient had a PCN reaction occurring within the last 10 years: no If all of the above answers are "NO", then may proceed with Cephalosporin use.  . Sulfa Antibiotics Shortness Of Breath and Swelling    VITALS:  Blood pressure (!)  145/88, pulse 93, temperature 97.9 F (36.6 C), temperature source Axillary, resp. rate (!) 32, height 5\' 3"  (1.6 m), weight 85.3 kg (188 lb 0.8 oz), SpO2 94 %.  PHYSICAL EXAMINATION:   Physical Exam  GENERAL:  62 y.o.-year-old patient lying in the bed with no acute distress.  Looks older than her stated age. EYES: Pupils equal, round, reactive to light and accommodation. No scleral icterus. Extraocular muscles intact.  HEENT: Head atraumatic, normocephalic. Oropharynx and nasopharynx clear.  NECK:  Supple, no jugular venous distention. No thyroid enlargement, no tenderness.  LUNGS: Distant breath sounds bilaterally, ++ wheezing, no rales, rhonchi. No use of accessory muscles of respiration.  CARDIOVASCULAR: S1, S2 normal. No murmurs, rubs, or gallops.  ABDOMEN: Soft, nontender, nondistended. Bowel sounds present. No organomegaly or mass.  EXTREMITIES: No cyanosis, clubbing or edema b/l.    NEUROLOGIC: Cranial nerves II through XII are intact. No focal Motor or sensory deficits b/l.   PSYCHIATRIC:  patient is alert and oriented x 3.  SKIN: No obvious rash, lesion, or ulcer.   LABORATORY PANEL:  CBC Recent Labs  Lab 10/01/17 0344  WBC 4.5  HGB 14.5  HCT 44.5  PLT 81*    Chemistries  Recent Labs  Lab 10/01/17 0344  NA 140  K 3.7  CL 105  CO2 27  GLUCOSE 157*  BUN 11  CREATININE 0.67  CALCIUM 8.3*  AST 58*  ALT 61*  ALKPHOS 110  BILITOT 0.8   Cardiac Enzymes Recent Labs  Lab 09/30/17 2330  TROPONINI 0.05*   RADIOLOGY:  Dg Chest Port 1 View  Result Date: 09/30/2017 CLINICAL DATA:  Respiratory distress. EXAM: PORTABLE CHEST 1 VIEW COMPARISON:  Radiographs of September 03, 2016. FINDINGS: The heart size and mediastinal contours are within normal limits. Both lungs are clear. No pneumothorax or pleural effusion is noted. The visualized skeletal structures are unremarkable. IMPRESSION: No acute cardiopulmonary abnormality seen. Electronically Signed   By: Lupita Raider,  M.D.   On: 09/30/2017 23:47   ASSESSMENT AND PLAN:  Bethany Villa  is a 62 y.o. female with a known history of COPD not on home oxygen, degenerative joint disease, GERD, hyperlipidemia, fibromyalgia presented to the emergency room with increased shortness of breath for the last 2 days.  Patient became more short of breath yesterday and could not even go to the restroom.  She her O2 saturation was around mid 70s when she came to the emergency room  1.  Acute hypoxic respiratory failure secondary to COPD exacerbation with ongoing heavy tobacco abuse. -Patient was on BiPAP now using as needed.  Continue oxygen wean as tolerated. -assess for home oxygen. -IV Solu-Medrol around-the-clock, nebulizer, inhalers, empiric Levaquin. -Incentive spirometer  2.  Anxiety -Patient appears a bit anxious and tearful.  She is trying to quit smoking. -If okay with ICU attending consider low-dose Xanax as needed  3.  GERD continue PPI  4.  Abnormal LFTs.  Patient asymptomatic.  Trending down.  5.  DVT prophylaxis subcu Lovenox  6.  Heavy tobacco abuse.  Patient advised on smoking cessation.  Offered nicotine patch.  4 minutes spent.  Case discussed with Care Management/Social Worker. Management plans discussed with the patient, family and they are in agreement.  CODE STATUS: Full  DVT Prophylaxis: lovenox  TOTAL TIME TAKING CARE OF THIS PATIENT: *40* minutes.  >50% time spent on counselling and coordination of care  POSSIBLE D/C IN *1-2 DAYS, DEPENDING ON CLINICAL CONDITION.  Note: This dictation was prepared with Dragon dictation along with smaller phrase technology. Any transcriptional errors that result from this process are unintentional.  Enedina Finner M.D on 10/01/2017 at 12:02 PM  Between 7am to 6pm - Pager - [phone]  After 6pm go to [email or web address] - password [email or web address]  [facility]  Office  [phone]  CC: Primary care physician; Patient, No Pcp PerPatient ID: Bethany Villa, female   DOB: Mar 25, 1956, 62 y.o.   MRN: [id number]

## 2017-10-01 NOTE — Progress Notes (Addendum)
Pt with increased PVCs. 8 beat run of Vtach. Mag ck->3.2, K 3.7. Trend Trop.

## 2017-10-01 NOTE — H&P (Signed)
[facility] [place] [facility]   PATIENT NAME: Bethany Villa    MR#:  [id number]  DATE OF BIRTH:  12/27/1955  DATE OF ADMISSION:  09/30/2017  PRIMARY CARE PHYSICIAN: Patient, No Pcp Per   REQUESTING/REFERRING PHYSICIAN:   CHIEF COMPLAINT:   Chief Complaint  Patient presents with  . Respiratory Distress    HISTORY OF PRESENT ILLNESS: Bethany Pedroenny Birdsall  is a 62 y.o. female with a known history of COPD not on home oxygen, degenerative joint disease, GERD, hyperlipidemia, fibromyalgia presented to the emergency room with increased shortness of breath for the last 2 days.  Patient became more short of breath yesterday and could not even go to the restroom.  She her O2 saturation was around mid 70s when she came to the emergency room.  She had a lot of wheezing in both the lungs.  Patient also has cough which is dry in nature.  Patient was put on BiPAP in the emergency room and stabilized.  She was given IV Solu-Medrol and multiple rounds of nebulization treatment.  Hospitalist service was consulted for further care.  PAST MEDICAL HISTORY:   Past Medical History:  Diagnosis Date  . Anxiety   . Asthma   . Chronic pain    lower back  . COPD (chronic obstructive pulmonary disease) (HCC)   . Depression   . DJD (degenerative joint disease)   . Fibromyalgia   . GERD (gastroesophageal reflux disease)   . Headache   . Hyperlipidemia   . Lower extremity edema   . On home oxygen therapy    has not been on since 2016  . Panic attack   . Shortness of breath dyspnea   . Sleep disorder     PAST SURGICAL HISTORY:  Past Surgical History:  Procedure Laterality Date  . ABDOMINAL SURGERY    . ANKLE SURGERY    . BACK SURGERY     lower  . CARPAL TUNNEL RELEASE    . CESAREAN SECTION    . KNEE SURGERY    . TONSILLECTOMY    . TOTAL KNEE ARTHROPLASTY Left 04/14/2016   Procedure: TOTAL KNEE ARTHROPLASTY;  Surgeon: Deeann SaintHoward Mckissack, MD;  Location: [facility] ORS;  Service:  Orthopedics;  Laterality: Left;  . TUBAL LIGATION      SOCIAL HISTORY:  Social History   Tobacco Use  . Smoking status: Current Every Day Smoker    Packs/day: 1.00    Years: 45.00    Pack years: 45.00    Types: Cigarettes  . Smokeless tobacco: Never Used  Substance Use Topics  . Alcohol use: Yes    Alcohol/week: 0.0 oz    Comment: rare    FAMILY HISTORY:  Family History  Problem Relation Age of Onset  . Cancer Mother        beast    DRUG ALLERGIES:  Allergies  Allergen Reactions  . Gabapentin Shortness Of Breath and Rash  . Mobic [Meloxicam] Anaphylaxis  . Penicillins Shortness Of Breath and Swelling    Has patient had a PCN reaction causing immediate rash, facial/tongue/throat swelling, SOB or lightheadedness with hypotension: unknown Has patient had a PCN reaction causing severe rash involving mucus membranes or skin necrosis: {unknown Has patient had a PCN reaction that required hospitalization {unknown Has patient had a PCN reaction occurring within the last 10 years: no If all of the above answers are "NO", then may proceed with Cephalosporin use.  . Sulfa Antibiotics Shortness Of Breath and Swelling  REVIEW OF SYSTEMS:   CONSTITUTIONAL: No fever, fatigue or weakness.  EYES: No blurred or double vision.  EARS, NOSE, AND THROAT: No tinnitus or ear pain.  RESPIRATORY: Has cough, shortness of breath, wheezing  No hemoptysis.  CARDIOVASCULAR: No chest pain, orthopnea, edema.  GASTROINTESTINAL: No nausea, vomiting, diarrhea or abdominal pain.  GENITOURINARY: No dysuria, hematuria.  ENDOCRINE: No polyuria, nocturia,  HEMATOLOGY: No anemia, easy bruising or bleeding SKIN: No rash or lesion. MUSCULOSKELETAL: No joint pain or arthritis.   NEUROLOGIC: No tingling, numbness, weakness.  PSYCHIATRY: No anxiety or depression.   MEDICATIONS AT HOME:  Prior to Admission medications   Medication Sig Start Date End Date Taking? Authorizing Provider  albuterol  (PROVENTIL) (2.5 MG/3ML) 0.083% nebulizer solution TAKE 3 ML BY NEBULIZATION Q 6 hrs prn FOR WHEEZING OR SOB Dx J44.9 Patient not taking: Reported on 10/01/2017 09/07/16   Kathlen Mody, MD  albuterol (VENTOLIN HFA) 108 (90 Base) MCG/ACT inhaler INHALE 2 PUFFS INTO THE LUNGS DAILY AS NEEDED FOR WHEEZING OR SHORTNESS OF BREATH. Patient not taking: Reported on 10/01/2017 06/08/17   Bennie Pierini, FNP  aspirin EC 325 MG tablet Take 1 tablet (325 mg total) by mouth daily. Patient not taking: Reported on 10/01/2017 09/07/16   Kathlen Mody, MD  atorvastatin (LIPITOR) 20 MG tablet TAKE 1 TABLET (20 MG TOTAL) BY MOUTH DAILY. Patient not taking: Reported on 10/01/2017 04/29/16   Bennie Pierini, FNP  doxylamine, Sleep, (SLEEP AID) 25 MG tablet Take 50 mg by mouth at bedtime.    [provider]  fluticasone (FLONASE) 50 MCG/ACT nasal spray Place 2 sprays into both nostrils daily. Patient not taking: Reported on 10/01/2017 11/19/14   Bennie Pierini, FNP  fluticasone (FLONASE) 50 MCG/ACT nasal spray USE 2 SPRAYS INTO THE NOSE DAILY. Patient not taking: Reported on 10/01/2017 06/28/17   Bennie Pierini, FNP  furosemide (LASIX) 40 MG tablet Take 1 tablet (40 mg total) by mouth daily. Reported on 09/23/2015 Patient not taking: Reported on 10/01/2017 09/07/16   Kathlen Mody, MD  guaiFENesin-dextromethorphan [facility] DM) 100-10 MG/5ML syrup Take 5 mLs by mouth every 4 (four) hours as needed for cough. Patient not taking: Reported on 10/01/2017 09/07/16   Kathlen Mody, MD  ipratropium-albuterol (DUONEB) 0.5-2.5 (3) MG/3ML SOLN Take 3 mLs by nebulization every 6 (six) hours as needed. Patient not taking: Reported on 10/01/2017 09/07/16   Kathlen Mody, MD  levofloxacin (LEVAQUIN) 750 MG tablet Take 1 tablet (750 mg total) by mouth daily at 6 PM. Patient not taking: Reported on 10/01/2017 09/07/16   Kathlen Mody, MD  methocarbamol (ROBAXIN) 500 MG tablet Take 500 mg by mouth 2 (two) times daily. 08/28/16    [provider]  nystatin (MYCOSTATIN) 100000 UNIT/ML suspension Take 5 mLs (500,000 Units total) by mouth 4 (four) times daily. Patient not taking: Reported on 10/01/2017 09/09/16   Bennie Pierini, FNP  pantoprazole (PROTONIX) 40 MG tablet Take 1 tablet (40 mg total) by mouth daily. Patient not taking: Reported on 10/01/2017 09/08/16   Kathlen Mody, MD  predniSONE (DELTASONE) 20 MG tablet Prednisone 40 mg daily for 3 days followed by  Prednisone 20 mg daily for 3 days. Patient not taking: Reported on 10/01/2017 09/08/16   Kathlen Mody, MD  PROAIR HFA 108 [phone] Base) MCG/ACT inhaler INHALE 2 PUFFS INTO THE LUNGS EVERY SIX HOURS AS NEEDED FOR WHEEZING OR SHORTNESS OF BREATH Patient not taking: Reported on 10/01/2017 10/01/16   Bennie Pierini, FNP  topiramate (TOPAMAX) 50 MG tablet Take 1 tablet (50  mg total) by mouth daily. Patient not taking: Reported on 10/01/2017 09/08/16   Kathlen Mody, MD  traMADol (ULTRAM) 50 MG tablet TAKE TWO TABLETS BY MOUTH AT BEDTIME 08/10/16   [provider]      PHYSICAL EXAMINATION:   VITAL SIGNS: Blood pressure (!) 137/96, pulse (!) 124, temperature 98.5 F (36.9 C), temperature source Axillary, resp. rate (!) 26, height 5\' 3"  (1.6 m), weight 85.7 kg (189 lb), SpO2 100 %.   On BIPAP : IPAP : 16 EPAP : 5 Rate : 10 FIO2 : 50% GENERAL:  62 y.o.-year-old patient lying in the bed with no acute distress.  EYES: Pupils equal, round, reactive to light and accommodation. No scleral icterus. Extraocular muscles intact.  HEENT: Head atraumatic, normocephalic. Oropharynx and nasopharynx clear.  NECK:  Supple, no jugular venous distention. No thyroid enlargement, no tenderness.  LUNGS: Decreased breath sounds bilaterally, bilateral wheeziing.   CARDIOVASCULAR: S1, S2 normal. No murmurs, rubs, or gallops.  ABDOMEN: Soft, nontender, nondistended. Bowel sounds present. No organomegaly or mass.  EXTREMITIES: No pedal edema, cyanosis, or clubbing.   NEUROLOGIC: Cranial nerves II through XII are intact. Muscle strength 5/5 in all extremities. Sensation intact. Gait not checked.  PSYCHIATRIC: The patient is alert and oriented x 3.  SKIN: No obvious rash, lesion, or ulcer.   LABORATORY PANEL:   CBC Recent Labs  Lab 09/30/17 2330  WBC 5.9  HGB 15.2  HCT 46.8  PLT 82*  MCV 92.4  MCH 30.0  MCHC 32.5  RDW 14.4  LYMPHSABS 1.5  MONOABS 0.7  EOSABS 0.1  BASOSABS 0.0   ------------------------------------------------------------------------------------------------------------------  Chemistries  Recent Labs  Lab 09/30/17 2330  NA 139  K 3.6  CL 105  CO2 27  GLUCOSE 122*  BUN 11  CREATININE 0.57  CALCIUM 8.3*  AST 63*  ALT 65*  ALKPHOS 116  BILITOT 0.7   ------------------------------------------------------------------------------------------------------------------ estimated creatinine clearance is 77.6 mL/min (by C-G formula based on SCr of 0.57 mg/dL). ------------------------------------------------------------------------------------------------------------------ No results for input(s): TSH, T4TOTAL, T3FREE, THYROIDAB in the last 72 hours.  Invalid input(s): FREET3   Coagulation profile No results for input(s): INR, PROTIME in the last 168 hours. ------------------------------------------------------------------------------------------------------------------- No results for input(s): DDIMER in the last 72 hours. -------------------------------------------------------------------------------------------------------------------  Cardiac Enzymes Recent Labs  Lab 09/30/17 2330  TROPONINI 0.05*   ------------------------------------------------------------------------------------------------------------------ Invalid input(s): POCBNP  ---------------------------------------------------------------------------------------------------------------  Urinalysis    Component Value Date/Time   COLORURINE  AMBER (A) 09/01/2016 2230   APPEARANCEUR TURBID (A) 09/01/2016 2230   APPEARANCEUR Hazy 08/22/2013 1111   LABSPEC 1.026 09/01/2016 2230   LABSPEC 1.016 08/22/2013 1111   PHURINE 6.0 09/01/2016 2230   GLUCOSEU NEGATIVE 09/01/2016 2230   GLUCOSEU Negative 08/22/2013 1111   HGBUR NEGATIVE 09/01/2016 2230   BILIRUBINUR NEGATIVE 09/01/2016 2230   BILIRUBINUR Negative 08/22/2013 1111   KETONESUR NEGATIVE 09/01/2016 2230   PROTEINUR >=300 (A) 09/01/2016 2230   UROBILINOGEN 0.2 07/24/2014 0635   NITRITE NEGATIVE 09/01/2016 2230   LEUKOCYTESUR NEGATIVE 09/01/2016 2230   LEUKOCYTESUR 1+ 08/22/2013 1111     RADIOLOGY: Dg Chest Port 1 View  Result Date: 09/30/2017 CLINICAL DATA:  Respiratory distress. EXAM: PORTABLE CHEST 1 VIEW COMPARISON:  Radiographs of September 03, 2016. FINDINGS: The heart size and mediastinal contours are within normal limits. Both lungs are clear. No pneumothorax or pleural effusion is noted. The visualized skeletal structures are unremarkable. IMPRESSION: No acute cardiopulmonary abnormality seen. Electronically Signed   By: Lupita Raider, M.D.   On: 09/30/2017 23:47  EKG: Orders placed or performed during the hospital encounter of 09/30/17  . ED EKG  . ED EKG  . EKG 12-Lead  . EKG 12-Lead    IMPRESSION AND PLAN: 62 year old female patient with history of fibromyalgia, emphysema, GERD, hyperlipidemia presented to the emergency room with increased shortness of breath and wheezing.  Admitting diagnosis 1.  Acute hypoxic respiratory failure 2.  Acute COPD exacerbation 3.  Abnormal liver function tests 4.  GERD 5.  Hyperlipidemia Treatment plan Admit patient to stepdown unit Continue BiPAP for respiratory failure Nebulization treatments IV Solu-Medrol 125 mg every 6 hourly Start patient on IV Levaquin antibiotic Follow-up liver function tests  All the records are reviewed and case discussed with ED provider. Management plans discussed with the patient,  family and they are in agreement.  CODE STATUS:FULL CODE Code Status History    Date Active Date Inactive Code Status Order ID Comments User Context   09/01/2016 20:05 09/07/2016 18:08 Full Code [id number]  Jonah Blue, MD Inpatient   04/14/2016 11:51 04/15/2016 14:23 Full Code [id number]  Deeann Saint, MD Inpatient   07/24/2014 09:56 07/28/2014 21:47 Full Code [id number]  Zakkiyya Pia, MD Inpatient   09/29/2013 22:03 10/03/2013 19:54 Full Code [id number]  Osvaldo Shipper, MD Inpatient   05/05/2013 17:32 05/08/2013 14:19 Full Code [id number]  Wilson Singer, MD ED   11/27/2012 19:55 12/06/2012 18:57 Full Code [id number]  Erick Blinks, MD Inpatient   10/03/2011 17:01 10/08/2011 15:08 Full Code [id number]  Erick Blinks, MD Inpatient   08/09/2011 21:18 08/11/2011 13:41 Full Code [id number]  Tarry Kos A Inpatient   05/19/2011 22:39 05/23/2011 16:46 Full Code [id number]  Arlyss Queen C Inpatient       TOTAL CRITICAL CARE TIME TAKING CARE OF THIS PATIENT:54 minutes.    Ihor Austin M.D on 10/01/2017 at 1:19 AM  Between 7am to 6pm - Pager - [phone]  After 6pm go to [email or web address] - password [email or web address]  [facility]  Office  [phone]  CC: Primary care physician; Patient, No Pcp Per

## 2017-10-02 ENCOUNTER — Inpatient Hospital Stay: Payer: Medicare Other

## 2017-10-02 DIAGNOSIS — J189 Pneumonia, unspecified organism: Secondary | ICD-10-CM

## 2017-10-02 LAB — BLOOD CULTURE ID PANEL (REFLEXED)
Acinetobacter baumannii: NOT DETECTED
CANDIDA KRUSEI: NOT DETECTED
CANDIDA PARAPSILOSIS: NOT DETECTED
Candida albicans: NOT DETECTED
Candida glabrata: NOT DETECTED
Candida tropicalis: NOT DETECTED
ESCHERICHIA COLI: NOT DETECTED
Enterobacter cloacae complex: NOT DETECTED
Enterobacteriaceae species: NOT DETECTED
Enterococcus species: NOT DETECTED
HAEMOPHILUS INFLUENZAE: NOT DETECTED
Klebsiella oxytoca: NOT DETECTED
Klebsiella pneumoniae: NOT DETECTED
Listeria monocytogenes: NOT DETECTED
Neisseria meningitidis: NOT DETECTED
PROTEUS SPECIES: NOT DETECTED
PSEUDOMONAS AERUGINOSA: NOT DETECTED
SERRATIA MARCESCENS: NOT DETECTED
STAPHYLOCOCCUS AUREUS BCID: NOT DETECTED
STAPHYLOCOCCUS SPECIES: NOT DETECTED
STREPTOCOCCUS PNEUMONIAE: NOT DETECTED
STREPTOCOCCUS SPECIES: NOT DETECTED
Streptococcus agalactiae: NOT DETECTED
Streptococcus pyogenes: NOT DETECTED

## 2017-10-02 LAB — CBC
HCT: 44.5 % (ref 35.0–47.0)
Hemoglobin: 14.4 g/dL (ref 12.0–16.0)
MCH: 30.1 pg (ref 26.0–34.0)
MCHC: 32.4 g/dL (ref 32.0–36.0)
MCV: 92.8 fL (ref 80.0–100.0)
PLATELETS: 95 10*3/uL — AB (ref 150–440)
RBC: 4.8 MIL/uL (ref 3.80–5.20)
RDW: 14.7 % — ABNORMAL HIGH (ref 11.5–14.5)
WBC: 7.8 10*3/uL (ref 3.6–11.0)

## 2017-10-02 LAB — BASIC METABOLIC PANEL
Anion gap: 7 (ref 5–15)
BUN: 15 mg/dL (ref 6–20)
CO2: 29 mmol/L (ref 22–32)
CREATININE: 0.65 mg/dL (ref 0.44–1.00)
Calcium: 8.7 mg/dL — ABNORMAL LOW (ref 8.9–10.3)
Chloride: 103 mmol/L (ref 101–111)
GFR calc Af Amer: >60 mL/min (ref >60)
Glucose, Bld: 122 mg/dL — ABNORMAL HIGH (ref 65–99)
POTASSIUM: 4.8 mmol/L (ref 3.5–5.1)
SODIUM: 139 mmol/L (ref 135–145)

## 2017-10-02 MED ORDER — INFLUENZA VAC SPLIT QUAD 0.5 ML IM SUSY
0.5000 mL | PREFILLED_SYRINGE | INTRAMUSCULAR | Status: AC
  Administered 2017-10-03: 0.5 mL via INTRAMUSCULAR
  Filled 2017-10-02: qty 0.5

## 2017-10-02 MED ORDER — FUROSEMIDE 10 MG/ML IJ SOLN
40.0000 mg | Freq: Once | INTRAMUSCULAR | Status: AC
  Administered 2017-10-02: 40 mg via INTRAVENOUS
  Filled 2017-10-02: qty 4

## 2017-10-02 MED ORDER — CHLORHEXIDINE GLUCONATE 0.12 % MT SOLN
15.0000 mL | Freq: Two times a day (BID) | OROMUCOSAL | Status: DC
  Administered 2017-10-02 – 2017-10-07 (×10): 15 mL via OROMUCOSAL
  Filled 2017-10-02 (×9): qty 15

## 2017-10-02 MED ORDER — ORAL CARE MOUTH RINSE
15.0000 mL | Freq: Two times a day (BID) | OROMUCOSAL | Status: DC
  Administered 2017-10-02 – 2017-10-05 (×5): 15 mL via OROMUCOSAL

## 2017-10-02 NOTE — Progress Notes (Signed)
Pt has remained alert and oriented x 4 with c/o mid, lower back pain-requesting morphine. Xanax for anxiety.  Pt has remained in NSR/ST low 100s with occasional PVCs. BP WNL.  Lung sounds have remained with bilat upper and lower expiratory wheezing -not relieved with nebs. Pt has been on/off bipap d/t tachypnea and increased WOB. Pt verbalizes need for bipap when assessed and is accepting; however, pt does not keep it on for extended periods. New orders to transition to HFNC.

## 2017-10-02 NOTE — Progress Notes (Addendum)
PULMONARY / CRITICAL CARE MEDICINE   Name: Bethany Villa MRN: [id number] DOB: 04/24/1956    ADMISSION DATE:  09/30/2017   HISTORY OF PRESENT ILLNESS:   This is a 62 year old Caucasian female, current smoker, history of COPD, who presents with progressive worsening shortness of breath patient states that symptoms started about 2 weeks ago and have progressively gotten worse.  She reports being around sick contacts but no confirmed cases of flu.  She states her symptoms got unbearable today hence she called EMS.  When EMS arrived, her SPO2 was 70% on room air.  She was given IV steroids nebs and placed on CPAP and transported to the ED.  At the ED she continued to be tachypneic and was placed on BiPAP.  She reports significant improvement in symptoms. She continues to smoke .  She reports a hacking persistent productive cough.  Cough is productive of yellow to green sputum.  She denies any fever chills nausea and vomiting.    REVIEW OF SYSTEMS:   Lower extremity pain upon standing  SUBJECTIVE:  Patient with intermittent non productive coughing  VITAL SIGNS: BP 121/78   Pulse (!) 102   Temp (!) 96.8 F (36 C) (Axillary)   Resp (!) 26   Ht 5\' 3"  (1.6 m)   Wt 188 lb 0.8 oz (85.3 kg)   SpO2 100%   BMI 33.31 kg/m   HEMODYNAMICS:  No compromise  VENTILATOR SETTINGS: FiO2 (%):  [50 %] 50 %  BIPAP 12/6  INTAKE / OUTPUT: I/O last 3 completed shifts: In: 846 [P.O.:840; I.V.:6] Out: 1500 [Urine:1500]  PHYSICAL EXAMINATION: General:  Comfortable on BIPAP Neuro:  AAOx4; no deficits HEENT:  PERRL Cardiovascular:  RRR Lungs: good A/E, rhonchi -no wheezing Abdomen:  Soft, NT Musculoskeletal:  No joint abnormality, varicosities B/L feet- palpable DP Skin:  Warm nd dry  LABS:  BMET Recent Labs  Lab 09/30/17 2330 10/01/17 0344 10/02/17 0629  NA 139 140 139  K 3.6 3.7 4.8  CL 105 105 103  CO2 27 27 29   BUN 11 11 15   CREATININE 0.57 0.67 0.65  GLUCOSE 122* 157* 122*     Electrolytes Recent Labs  Lab 09/30/17 2330 10/01/17 0344 10/01/17 1701 10/02/17 0629  CALCIUM 8.3* 8.3*  --  8.7*  MG  --  3.2* 2.1  --     CBC Recent Labs  Lab 09/30/17 2330 10/01/17 0344 10/02/17 0629  WBC 5.9 4.5 7.8  HGB 15.2 14.5 14.4  HCT 46.8 44.5 44.5  PLT 82* 81* 95*    Coag's No results for input(s): APTT, INR in the last 168 hours.  Sepsis Markers Recent Labs  Lab 09/30/17 2330  LATICACIDVEN 0.8    ABG Recent Labs  Lab 09/30/17 2330 10/01/17 0500  PHART 7.28* 7.33*  PCO2ART 63* 52*  PO2ART 161* 62*    Liver Enzymes Recent Labs  Lab 09/30/17 2330 10/01/17 0344  AST 63* 58*  ALT 65* 61*  ALKPHOS 116 110  BILITOT 0.7 0.8  ALBUMIN 3.5 3.4*    Cardiac Enzymes Recent Labs  Lab 09/30/17 2330 10/01/17 1701 10/01/17 2217  TROPONINI 0.05* 0.03* <0.03    Glucose Recent Labs  Lab 10/01/17 0220  GLUCAP 121*    Imaging Dg Chest Port 1 View  Result Date: 10/02/2017 CLINICAL DATA:  Acute respiratory failure. EXAM: PORTABLE CHEST 1 VIEW COMPARISON:  09/30/2017 FINDINGS: Cardiomediastinal silhouette is normal. Mediastinal contours appear intact. There is no evidence of focal airspace consolidation, pleural effusion or pneumothorax.  Mild chronic bronchitic changes. Osseous structures are without acute abnormality. Soft tissues are grossly normal. IMPRESSION: Mild chronic bronchitic changes, otherwise normal. Electronically Signed   By: Ted Mcalpine M.D.   On: 10/02/2017 07:27     STUDIES:  None  CULTURES: Negative MRSA- nares Blood- GPB  ANTIBIOTICS: Levofloxacin  SIGNIFICANT EVENTS: 02/01- admitted  LINES/TUBES: Peripherial  DISCUSSION: This 62 year old lady with COPD and active nicotine dependency was admitted with dyspnoea.  She has been refractory to steroids systemic and inhaled.  Will check BNP and ECHO as she is at risk for cardiac disease with smoking history.  ASSESSMENT / PLAN:  1. Acute hypoxic  respiratory Failure still requiring BIPAP.  Will give a dose of Lasix as there is PVC on CXR. 2. Acute exacerbation of COPD 3. Acute Bronchitis 4. Active Nicotine dependency 5. Anxiety 6. GPB bacteremia 1/2- unclear if contaminant.   Plan 1. Continue on NIPPV.  Will try to transition to HFO, will get 2D ECHO and BNP to evaluate for cardiac component of illness 2. Continue on bronchodilatory, steroids , ABX and lung recruitment 3. Anti anxiety therapy 4. GI and DVT prophylaxis 5. Repeat blood cultures. Continue on Levofloxacin.  [name], MD Pulmonary and Critical Care Medicine [facility] Pager: [phone]  10/02/2017, 3:17 PM

## 2017-10-02 NOTE — Progress Notes (Signed)
[facility] - [place] at [facility]   PATIENT NAME: Bethany Villa    MR#:  [id number]  DATE OF BIRTH:  17-Jul-1956  SUBJECTIVE:   Came in with increasing shortness of breath and wheezing.  Patient was placed on BiPAP.  Feels a little better.  Although some wheezing present.   REVIEW OF SYSTEMS:   Review of Systems  Constitutional: Negative for chills, fever and weight loss.  HENT: Negative for ear discharge, ear pain and nosebleeds.   Eyes: Negative for blurred vision, pain and discharge.  Respiratory: Negative for sputum production, shortness of breath, wheezing and stridor.   Cardiovascular: Negative for chest pain, palpitations, orthopnea and PND.  Gastrointestinal: Negative for abdominal pain, diarrhea, nausea and vomiting.  Genitourinary: Negative for frequency and urgency.  Musculoskeletal: Negative for back pain and joint pain.  Neurological: Negative for sensory change, speech change, focal weakness and weakness.  Psychiatric/Behavioral: Negative for depression and hallucinations. The patient is not nervous/anxious.    Tolerating Diet:yes Tolerating PT: pending  DRUG ALLERGIES:   Allergies  Allergen Reactions  . Gabapentin Shortness Of Breath and Rash  . Mobic [Meloxicam] Anaphylaxis  . Penicillins Shortness Of Breath and Swelling    Has patient had a PCN reaction causing immediate rash, facial/tongue/throat swelling, SOB or lightheadedness with hypotension: unknown Has patient had a PCN reaction causing severe rash involving mucus membranes or skin necrosis: {unknown Has patient had a PCN reaction that required hospitalization {unknown Has patient had a PCN reaction occurring within the last 10 years: no If all of the above answers are "NO", then may proceed with Cephalosporin use.  . Sulfa Antibiotics Shortness Of Breath and Swelling    VITALS:  Blood pressure (!) 153/90, pulse (!) 103, temperature (!) 96.8 F (36 C), temperature source  Axillary, resp. rate (!) 33, height 5\' 3"  (1.6 m), weight 85.3 kg (188 lb 0.8 oz), SpO2 95 %.  PHYSICAL EXAMINATION:   Physical Exam  GENERAL:  62 y.o.-year-old patient lying in the bed with no acute distress.  Looks older than her stated age. EYES: Pupils equal, round, reactive to light and accommodation. No scleral icterus. Extraocular muscles intact.  HEENT: Head atraumatic, normocephalic. Oropharynx and nasopharynx clear. BIPAP++ NECK:  Supple, no jugular venous distention. No thyroid enlargement, no tenderness.  LUNGS: Distant breath sounds bilaterally, ++ wheezing, no rales, rhonchi. No use of accessory muscles of respiration.  CARDIOVASCULAR: S1, S2 normal. No murmurs, rubs, or gallops.  ABDOMEN: Soft, nontender, nondistended. Bowel sounds present. No organomegaly or mass.  EXTREMITIES: No cyanosis, clubbing or edema b/l.    NEUROLOGIC: Cranial nerves II through XII are intact. No focal Motor or sensory deficits b/l.   PSYCHIATRIC:  patient is alert and oriented x 3.  SKIN: No obvious rash, lesion, or ulcer.   LABORATORY PANEL:  CBC Recent Labs  Lab 10/02/17 0629  WBC 7.8  HGB 14.4  HCT 44.5  PLT 95*    Chemistries  Recent Labs  Lab 10/01/17 0344 10/01/17 1701 10/02/17 0629  NA 140  --  139  K 3.7  --  4.8  CL 105  --  103  CO2 27  --  29  GLUCOSE 157*  --  122*  BUN 11  --  15  CREATININE 0.67  --  0.65  CALCIUM 8.3*  --  8.7*  MG 3.2* 2.1  --   AST 58*  --   --   ALT 61*  --   --  ALKPHOS 110  --   --   BILITOT 0.8  --   --    Cardiac Enzymes Recent Labs  Lab 10/01/17 2217  TROPONINI <0.03   RADIOLOGY:  Dg Chest Port 1 View  Result Date: 10/02/2017 CLINICAL DATA:  Acute respiratory failure. EXAM: PORTABLE CHEST 1 VIEW COMPARISON:  09/30/2017 FINDINGS: Cardiomediastinal silhouette is normal. Mediastinal contours appear intact. There is no evidence of focal airspace consolidation, pleural effusion or pneumothorax. Mild chronic bronchitic changes.  Osseous structures are without acute abnormality. Soft tissues are grossly normal. IMPRESSION: Mild chronic bronchitic changes, otherwise normal. Electronically Signed   By: Ted Mcalpineobrinka  Dimitrova M.D.   On: 10/02/2017 07:27   Dg Chest Port 1 View  Result Date: 09/30/2017 CLINICAL DATA:  Respiratory distress. EXAM: PORTABLE CHEST 1 VIEW COMPARISON:  Radiographs of September 03, 2016. FINDINGS: The heart size and mediastinal contours are within normal limits. Both lungs are clear. No pneumothorax or pleural effusion is noted. The visualized skeletal structures are unremarkable. IMPRESSION: No acute cardiopulmonary abnormality seen. Electronically Signed   By: Lupita RaiderJames  Green Jr, M.D.   On: 09/30/2017 23:47   ASSESSMENT AND PLAN:  Bethany Villa  is a 62 y.o. female with a known history of COPD not on home oxygen, degenerative joint disease, GERD, hyperlipidemia, fibromyalgia presented to the emergency room with increased shortness of breath for the last 2 days.  Patient became more short of breath yesterday and could not even go to the restroom.  She her O2 saturation was around mid 70s when she came to the emergency room  1.  Acute hypoxic respiratory failure secondary to COPD exacerbation with ongoing heavy tobacco abuse. -Patient was on BiPAP now using as needed.  Continue oxygen wean as tolerated. -assess for home oxygen. -IV Solu-Medrol around-the-clock, nebulizer, inhalers, empiric Levaquin. -Incentive spirometer  2.  Anxiety -Patient appears a bit anxious and tearful.  She is trying to quit smoking. -If okay with ICU attending consider low-dose Xanax as needed  3.  GERD continue PPI  4.  Abnormal LFTs.  Patient asymptomatic.  Trending down.  5.  DVT prophylaxis subcu Lovenox  6.  Heavy tobacco abuse.  Patient advised on smoking cessation.  Offered nicotine patch.  4 minutes spent.  Case discussed with Care Management/Social Worker. Management plans discussed with the patient, family and they  are in agreement.  CODE STATUS: Full  DVT Prophylaxis: lovenox  TOTAL TIME TAKING CARE OF THIS PATIENT: *40* minutes.  >50% time spent on counselling and coordination of care  POSSIBLE D/C IN *1-2 DAYS, DEPENDING ON CLINICAL CONDITION.  Note: This dictation was prepared with Dragon dictation along with smaller phrase technology. Any transcriptional errors that result from this process are unintentional.  Enedina FinnerSona Jonah Gingras M.D on 10/02/2017 at 12:15 PM  Between 7am to 6pm - Pager - [phone]  After 6pm go to [email or web address] - password [facility] [facility]  [facility] [place] [facility]  Office  [phone]  CC: Primary care physician; Patient, No Pcp PerPatient ID: Bethany Villa, female   DOB: 02/10/1956, 62 y.o.   MRN: [id number]

## 2017-10-02 NOTE — Progress Notes (Signed)
PHARMACY - PHYSICIAN COMMUNICATION CRITICAL VALUE ALERT - BLOOD CULTURE IDENTIFICATION (BCID)  Results for orders placed or performed during the hospital encounter of 09/30/17  Blood Culture ID Panel (Reflexed) (Collected: 09/30/2017 11:30 PM)  Result Value Ref Range   Enterococcus species NOT DETECTED NOT DETECTED   Listeria monocytogenes NOT DETECTED NOT DETECTED   Staphylococcus species NOT DETECTED NOT DETECTED   Staphylococcus aureus NOT DETECTED NOT DETECTED   Streptococcus species NOT DETECTED NOT DETECTED   Streptococcus agalactiae NOT DETECTED NOT DETECTED   Streptococcus pneumoniae NOT DETECTED NOT DETECTED   Streptococcus pyogenes NOT DETECTED NOT DETECTED   Acinetobacter baumannii NOT DETECTED NOT DETECTED   Enterobacteriaceae species NOT DETECTED NOT DETECTED   Enterobacter cloacae complex NOT DETECTED NOT DETECTED   Escherichia coli NOT DETECTED NOT DETECTED   Klebsiella oxytoca NOT DETECTED NOT DETECTED   Klebsiella pneumoniae NOT DETECTED NOT DETECTED   Proteus species NOT DETECTED NOT DETECTED   Serratia marcescens NOT DETECTED NOT DETECTED   Haemophilus influenzae NOT DETECTED NOT DETECTED   Neisseria meningitidis NOT DETECTED NOT DETECTED   Pseudomonas aeruginosa NOT DETECTED NOT DETECTED   Candida albicans NOT DETECTED NOT DETECTED   Candida glabrata NOT DETECTED NOT DETECTED   Candida krusei NOT DETECTED NOT DETECTED   Candida parapsilosis NOT DETECTED NOT DETECTED   Candida tropicalis NOT DETECTED NOT DETECTED   10/02/2017 Lab reports GPR in 1 of 4 bottles no species detected. Patient still tachycardic but no fever, WBC WNL. On Levaquin for respiratory illness.  Name of physician (or Provider) Contacted: Dr. Peggye Pittichards  Changes to prescribed antibiotics required: None at this time - Dr. Peggye Pittichards believes this single bottle result is likely a contaminant. Continue Levaquin.  Carola FrostNathan A Dayleen Beske, Pharm.D., BCPS Clinical Pharmacist 10/02/2017  5:44 PM

## 2017-10-03 ENCOUNTER — Inpatient Hospital Stay (HOSPITAL_COMMUNITY)
Admit: 2017-10-03 | Discharge: 2017-10-03 | Disposition: A | Discharge status: Home / Self Care | Payer: Medicare Other | Attending: Internal Medicine | Admitting: Internal Medicine

## 2017-10-03 DIAGNOSIS — R0600 Dyspnea, unspecified: Secondary | ICD-10-CM

## 2017-10-03 LAB — BRAIN NATRIURETIC PEPTIDE: B NATRIURETIC PEPTIDE 5: 72 pg/mL (ref 0.0–100.0)

## 2017-10-03 LAB — ECHOCARDIOGRAM COMPLETE
HEIGHTINCHES: 63 in
Weight: 3008.84 oz

## 2017-10-03 LAB — HIV ANTIBODY (ROUTINE TESTING W REFLEX): HIV SCREEN 4TH GENERATION: NONREACTIVE

## 2017-10-03 MED ORDER — LEVOFLOXACIN 750 MG PO TABS
750.0000 mg | ORAL_TABLET | Freq: Every day | ORAL | Status: DC
  Administered 2017-10-04 – 2017-10-05 (×2): 750 mg via ORAL
  Filled 2017-10-03 (×2): qty 1

## 2017-10-03 MED ORDER — PHENOL 1.4 % MT LIQD
1.0000 | OROMUCOSAL | Status: DC | PRN
  Administered 2017-10-03: 1 via OROMUCOSAL
  Filled 2017-10-03: qty 177

## 2017-10-03 NOTE — Plan of Care (Signed)
Pt has remained on HFNC all day without any incidence of breathing difficulty. Has been coughing up yellow sputum. Pt has been medicated throughout the day for back pain related to her fibromyalgia. Vital signs remained WNL all shift.

## 2017-10-03 NOTE — Progress Notes (Signed)
Placed pt on  HFNC  40 lpm @40 % tolerating well sat 100% , bipap standing by

## 2017-10-03 NOTE — Progress Notes (Signed)
   10/03/17 1400  Clinical Encounter Type  Visited With Patient not available;Health care provider  Visit Type Follow-up   Chaplain attempted follow-up visit; patient receiving care, unable to engage at present.

## 2017-10-03 NOTE — Progress Notes (Signed)
   10/03/17 1145  Clinical Encounter Type  Visited With Patient  Visit Type Initial  Referral From Nurse  Consult/Referral To Chaplain   While rounding on the unit, staff suggested chaplain check on patient.  Chaplain offered visit; patient asked chaplain to return later today.

## 2017-10-03 NOTE — Care Management (Signed)
Patient currently off continuous bipap and on HFNC.  Patient not able to participate in assessment when CM attempted.  Will try again 2/5

## 2017-10-03 NOTE — Progress Notes (Signed)
PULMONARY / CRITICAL CARE MEDICINE   Name: Basilio Cairoenny E Lazalde MRN: [id number] DOB: 07/27/1956    ADMISSION DATE:  09/30/2017   HISTORY OF PRESENT ILLNESS:   This is a 62 year old Caucasian female, current smoker, history of COPD, who presents with progressive worsening shortness of breath patient states that symptoms started about 2 weeks ago and have progressively gotten worse.  She reports being around sick contacts but no confirmed cases of flu.  She states her symptoms got unbearable Friday hence she called EMS.  When EMS arrived, her SPO2 was 70% on room air.  She was given IV steroids nebs and placed on CPAP and transported to the ED.  At the ED she continued to be tachypneic and was placed on BiPAP.  She reports significant improvement in symptoms. She continues to smoke .  She reports a hacking persistent productive cough.  Cough is productive of yellow to green sputum.  +Subjective fever on Friday, no chills. No chest pain or palpitations. +Nausea, no vomiting.   REVIEW OF SYSTEMS:   10 point ROS negative except as per HPI  SUBJECTIVE:  Patient with intermittent non productive coughing  VITAL SIGNS: BP 115/72 (BP Location: Right Arm)   Pulse 92   Temp (!) 97.5 F (36.4 C) (Axillary)   Resp (!) 24   Ht 5\' 3"  (1.6 m)   Wt 85.3 kg (188 lb 0.8 oz)   SpO2 98%   BMI 33.31 kg/m   HEMODYNAMICS:  No compromise  VENTILATOR SETTINGS: FiO2 (%):  [36.5 %-50 %] 36.5 %  BIPAP 12/6  INTAKE / OUTPUT: I/O last 3 completed shifts: In: 953 [P.O.:800; I.V.:3; IV Piggyback:150] Out: 2400 [Urine:2400]  PHYSICAL EXAMINATION: General:  Comfortable on HFNC Neuro:  AAOx4; grossly non-focal HEENT:  PERRL Cardiovascular:  S1S2 RRR no m/g/r 2+ peripheral pulses Lungs: Normal WOB diffuse wheezing bilaterally Abdomen:  Soft, NT Musculoskeletal:  No joint abnormality, varicosities B/L feet- palpable DP Skin:  Warm and dry  LABS:  BMET Recent Labs  Lab 09/30/17 2330 10/01/17 0344  10/02/17 0629  NA 139 140 139  K 3.6 3.7 4.8  CL 105 105 103  CO2 27 27 29   BUN 11 11 15   CREATININE 0.57 0.67 0.65  GLUCOSE 122* 157* 122*    Electrolytes Recent Labs  Lab 09/30/17 2330 10/01/17 0344 10/01/17 1701 10/02/17 0629  CALCIUM 8.3* 8.3*  --  8.7*  MG  --  3.2* 2.1  --     CBC Recent Labs  Lab 09/30/17 2330 10/01/17 0344 10/02/17 0629  WBC 5.9 4.5 7.8  HGB 15.2 14.5 14.4  HCT 46.8 44.5 44.5  PLT 82* 81* 95*    Coag's No results for input(s): APTT, INR in the last 168 hours.  Sepsis Markers Recent Labs  Lab 09/30/17 2330  LATICACIDVEN 0.8    ABG Recent Labs  Lab 09/30/17 2330 10/01/17 0500  PHART 7.28* 7.33*  PCO2ART 63* 52*  PO2ART 161* 62*    Liver Enzymes Recent Labs  Lab 09/30/17 2330 10/01/17 0344  AST 63* 58*  ALT 65* 61*  ALKPHOS 116 110  BILITOT 0.7 0.8  ALBUMIN 3.5 3.4*    Cardiac Enzymes Recent Labs  Lab 09/30/17 2330 10/01/17 1701 10/01/17 2217  TROPONINI 0.05* 0.03* <0.03    Glucose Recent Labs  Lab 10/01/17 0220  GLUCAP 121*    Imaging No results found.   STUDIES:  None  CULTURES: Negative MRSA- nares Blood- GPB  ANTIBIOTICS: Levofloxacin  SIGNIFICANT EVENTS: 02/01- admitted 02/03  weaned NIV to HFNC, back on NIV overnight  02/02 off of NIV on HFNC   LINES/TUBES: Peripherial  DISCUSSION: This 62 year old lady with COPD and active nicotine dependency was admitted with dyspnoea.  She has been refractory to steroids systemic and inhaled. BNP negative, ECHO pending today.  ASSESSMENT / PLAN:  1. Acute hypoxic respiratory failure    2. Acute exacerbation of COPD 3. Acute Bronchitis 4. Active Nicotine dependency 5. Anxiety 6. GPB bacteremia 1/2- unclear if contaminant.   Plan 1. Continue on HFNC with NIV as needed. 2. Continue on bronchodilators, steroids , ABX  3. Anti anxiety therapy 4. GI and DVT prophylaxis 5. Repeat blood cultures. Continue on Levofloxacin.  Elinor Dodge,  PA-S 10/03/2017, 9:03 AM   Pulmonary/critical care attending  I have personally seen and examined Mrs. Hyacinth Meeker with PA student, I have reviewed, revised and confirm note. I have personally performed history physical, reviewed laboratory studies and all imaging studies. In short patient with underlying history of severe COPD, prior history of pneumonia with recurrent hypoxemic respiratory failure, nicotine dependence presented with progressive shortness of breath consistent with COPD exacerbation. Has been treated on and off with noninvasive ventilation. Is also been on systemic steroids, albuterol, Atrovent and Levaquin coverage.  Vital signs: Please see above listed vital signs Patient is awake alert and communicating Pulmonary Prolonged expiratory phase, diffuse wheezing appreciated Cardiovascular: Patient with unlikely sinus mechanism  Labs noted. Chest x-ray is stable. Presently getting echocardiogram  Severe COPD with exacerbation. We'll continue systemic steroids, complete a course of Levaquin for 7 days, albuterol, Atrovent, budesonide, wean noninvasive as tolerated to nasal cannula. When stable will transfer to floor.  Tora Kindred, D.O.

## 2017-10-03 NOTE — Progress Notes (Signed)
   10/03/17 1510  Clinical Encounter Type  Visited With Patient  Visit Type Follow-up   Chaplain attempted follow up; however, patient indicated that she needs to rest and would prefer visit another day.

## 2017-10-03 NOTE — Plan of Care (Signed)
A&O patient tolerating HFNC 40% O2 sats are 95%. C/o chronic back pain, improved with prn IV pain medication. Pt also c/o anxiety, relieved with prn Xanax. Pt has external catheter draining clear yellow urine. Vital signs stable. Pt has been NSR with BBB on cardiac monitor.

## 2017-10-03 NOTE — Progress Notes (Signed)
PHARMACIST - PHYSICIAN COMMUNICATION DR:   Lonn Georgiaonforti CONCERNING: Antibiotic IV to Oral Route Change Policy  RECOMMENDATION: This patient is receiving Levaquin by the intravenous route.  Based on criteria approved by the Pharmacy and Therapeutics Committee, the antibiotic(s) is/are being converted to the equivalent oral dose form(s).   DESCRIPTION: These criteria include:  Patient being treated for a respiratory tract infection, urinary tract infection, cellulitis or clostridium difficile associated diarrhea if on metronidazole  The patient is not neutropenic and does not exhibit a GI malabsorption state  The patient is eating (either orally or via tube) and/or has been taking other orally administered medications for a least 24 hours  The patient is improving clinically and has a Tmax < 100.5  If you have questions about this conversion, please contact the Pharmacy Department  []   [phone]( [phone] )  [facility] [x]   [phone]( [phone] )  [facility] []   [phone]( [phone] )  [facility] []   [phone]( [phone]  [facility] []   [phone]( [phone] )  [facility]   Luisa HartScott Niels Cranshaw, PharmD Clinical Pharmacist

## 2017-10-03 NOTE — Progress Notes (Signed)
*  PRELIMINARY RESULTS* Echocardiogram 2D Echocardiogram has been performed.  Bethany Villa 10/03/2017, 9:54 AM

## 2017-10-03 NOTE — Progress Notes (Signed)
[facility] - [place] at [facility]   PATIENT NAME: Bethany Villa    MR#:  [id number]  DATE OF BIRTH:  August 31, 1955  SUBJECTIVE:   Came in with increasing shortness of breath and wheezing.  Patient was placed on BiPAP.  Feels a little better.  Although some wheezing present.   Now on HFNC 40% fio2 REVIEW OF SYSTEMS:   Review of Systems  Constitutional: Negative for chills, fever and weight loss.  HENT: Negative for ear discharge, ear pain and nosebleeds.   Eyes: Negative for blurred vision, pain and discharge.  Respiratory: Negative for sputum production, shortness of breath, wheezing and stridor.   Cardiovascular: Negative for chest pain, palpitations, orthopnea and PND.  Gastrointestinal: Negative for abdominal pain, diarrhea, nausea and vomiting.  Genitourinary: Negative for frequency and urgency.  Musculoskeletal: Negative for back pain and joint pain.  Neurological: Negative for sensory change, speech change, focal weakness and weakness.  Psychiatric/Behavioral: Negative for depression and hallucinations. The patient is not nervous/anxious.    Tolerating Diet:yes Tolerating PT: pending  DRUG ALLERGIES:   Allergies  Allergen Reactions  . Gabapentin Shortness Of Breath and Rash  . Mobic [Meloxicam] Anaphylaxis  . Penicillins Shortness Of Breath and Swelling    Has patient had a PCN reaction causing immediate rash, facial/tongue/throat swelling, SOB or lightheadedness with hypotension: unknown Has patient had a PCN reaction causing severe rash involving mucus membranes or skin necrosis: {unknown Has patient had a PCN reaction that required hospitalization {unknown Has patient had a PCN reaction occurring within the last 10 years: no If all of the above answers are "NO", then may proceed with Cephalosporin use.  . Sulfa Antibiotics Shortness Of Breath and Swelling    VITALS:  Blood pressure (!) 149/115, pulse (!) 109, temperature 97.7 F (36.5 C),  temperature source Axillary, resp. rate (!) 22, height 5\' 3"  (1.6 m), weight 85.3 kg (188 lb 0.8 oz), SpO2 98 %.  PHYSICAL EXAMINATION:   Physical Exam  GENERAL:  62 y.o.-year-old patient lying in the bed with no acute distress.  Looks older than her stated age. EYES: Pupils equal, round, reactive to light and accommodation. No scleral icterus. Extraocular muscles intact.  HEENT: Head atraumatic, normocephalic. Oropharynx and nasopharynx clear. BIPAP++ NECK:  Supple, no jugular venous distention. No thyroid enlargement, no tenderness.  LUNGS: Distant breath sounds bilaterally, ++ wheezing, no rales, rhonchi. No use of accessory muscles of respiration.  CARDIOVASCULAR: S1, S2 normal. No murmurs, rubs, or gallops.  ABDOMEN: Soft, nontender, nondistended. Bowel sounds present. No organomegaly or mass.  EXTREMITIES: No cyanosis, clubbing or edema b/l.    NEUROLOGIC: Cranial nerves II through XII are intact. No focal Motor or sensory deficits b/l.   PSYCHIATRIC:  patient is alert and oriented x 3.  SKIN: No obvious rash, lesion, or ulcer.   LABORATORY PANEL:  CBC Recent Labs  Lab 10/02/17 0629  WBC 7.8  HGB 14.4  HCT 44.5  PLT 95*    Chemistries  Recent Labs  Lab 10/01/17 0344 10/01/17 1701 10/02/17 0629  NA 140  --  139  K 3.7  --  4.8  CL 105  --  103  CO2 27  --  29  GLUCOSE 157*  --  122*  BUN 11  --  15  CREATININE 0.67  --  0.65  CALCIUM 8.3*  --  8.7*  MG 3.2* 2.1  --   AST 58*  --   --   ALT 61*  --   --  ALKPHOS 110  --   --   BILITOT 0.8  --   --    Cardiac Enzymes Recent Labs  Lab 10/01/17 2217  TROPONINI <0.03   RADIOLOGY:  Dg Chest Port 1 View  Result Date: 10/02/2017 CLINICAL DATA:  Acute respiratory failure. EXAM: PORTABLE CHEST 1 VIEW COMPARISON:  09/30/2017 FINDINGS: Cardiomediastinal silhouette is normal. Mediastinal contours appear intact. There is no evidence of focal airspace consolidation, pleural effusion or pneumothorax. Mild chronic  bronchitic changes. Osseous structures are without acute abnormality. Soft tissues are grossly normal. IMPRESSION: Mild chronic bronchitic changes, otherwise normal. Electronically Signed   By: Ted Mcalpineobrinka  Dimitrova M.D.   On: 10/02/2017 07:27   ASSESSMENT AND PLAN:  Bethany Villa  is a 62 y.o. female with a known history of COPD not on home oxygen, degenerative joint disease, GERD, hyperlipidemia, fibromyalgia presented to the emergency room with increased shortness of breath for the last 2 days.  Patient became more short of breath yesterday and could not even go to the restroom.  She her O2 saturation was around mid 70s when she came to the emergency room  1.  Acute hypoxic respiratory failure secondary to COPD exacerbation with ongoing heavy tobacco abuse. -Patient was on BiPAP now using as needed--now on HFNC 40%-- Continue oxygen wean as tolerated. -assess for home oxygen. -IV Solu-Medrol around-the-clock, nebulizer, inhalers, empiric Levaquin. -Incentive spirometer  2.  Anxiety -Patient appears a bit anxious and tearful.  She is trying to quit smoking.  3.  GERD continue PPI  4.  Abnormal LFTs.  Patient asymptomatic.  Trending down.  5.  DVT prophylaxis subcu Lovenox  6.  Heavy tobacco abuse.  Patient advised on smoking cessation.  Offered nicotine patch.  4 minutes spent.  Case discussed with Care Management/Social Worker. Management plans discussed with the patient, family and they are in agreement.  CODE STATUS: Full  DVT Prophylaxis: lovenox  TOTAL TIME TAKING CARE OF THIS PATIENT: *30* minutes.  >50% time spent on counselling and coordination of care  POSSIBLE D/C IN *1-2 DAYS, DEPENDING ON CLINICAL CONDITION.  Note: This dictation was prepared with Dragon dictation along with smaller phrase technology. Any transcriptional errors that result from this process are unintentional.  Enedina FinnerSona Preslynn Bier M.D on 10/03/2017 at 12:16 PM  Between 7am to 6pm - Pager - [phone]  After  6pm go to [email or web address] - password [employer] [facility] [facility]  [facility] [place] [facility]  Office  [phone]  CC: Primary care physician; Patient, No Pcp PerPatient ID: Bethany Villa, female   DOB: 05/24/1956, 62 y.o.   MRN: [id number]

## 2017-10-04 MED ORDER — METHYLPREDNISOLONE SODIUM SUCC 125 MG IJ SOLR
60.0000 mg | Freq: Two times a day (BID) | INTRAMUSCULAR | Status: DC
  Administered 2017-10-05: 60 mg via INTRAVENOUS
  Filled 2017-10-04: qty 2

## 2017-10-04 MED ORDER — TRAMADOL HCL 50 MG PO TABS
50.0000 mg | ORAL_TABLET | Freq: Four times a day (QID) | ORAL | Status: DC | PRN
  Administered 2017-10-04 – 2017-10-05 (×3): 100 mg via ORAL
  Filled 2017-10-04 (×3): qty 2

## 2017-10-04 MED ORDER — HYDROCODONE-ACETAMINOPHEN 5-325 MG PO TABS
1.0000 | ORAL_TABLET | ORAL | Status: DC | PRN
Start: 2017-10-04 — End: 2017-10-04

## 2017-10-04 MED ORDER — ALPRAZOLAM 0.5 MG PO TABS
0.5000 mg | ORAL_TABLET | Freq: Three times a day (TID) | ORAL | Status: DC
  Administered 2017-10-04 – 2017-10-06 (×7): 0.5 mg via ORAL
  Filled 2017-10-04 (×7): qty 1

## 2017-10-04 MED ORDER — TRAMADOL HCL 50 MG PO TABS: 50.0000 mg | ORAL_TABLET | Freq: Four times a day (QID) | ORAL | Status: DC

## 2017-10-04 NOTE — Progress Notes (Signed)
[facility] [place] [facility]   PATIENT NAME: Bethany Villa    MR#:  [id number]  DATE OF BIRTH:  August 22, 1956  SUBJECTIVE:   Now on [place] oxygen anxious REVIEW OF SYSTEMS:   Review of Systems  Constitutional: Negative for chills, fever and weight loss.  HENT: Negative for ear discharge, ear pain and nosebleeds.   Eyes: Negative for blurred vision, pain and discharge.  Respiratory: Negative for sputum production, shortness of breath, wheezing and stridor.   Cardiovascular: Negative for chest pain, palpitations, orthopnea and PND.  Gastrointestinal: Negative for abdominal pain, diarrhea, nausea and vomiting.  Genitourinary: Negative for frequency and urgency.  Musculoskeletal: Negative for back pain and joint pain.  Neurological: Negative for sensory change, speech change, focal weakness and weakness.  Psychiatric/Behavioral: Negative for depression and hallucinations. The patient is not nervous/anxious.    Tolerating Diet:yes Tolerating PT: NO PT f/u needed  DRUG ALLERGIES:   Allergies  Allergen Reactions  . Gabapentin Shortness Of Breath and Rash  . Mobic [Meloxicam] Anaphylaxis  . Penicillins Shortness Of Breath and Swelling    Has patient had a PCN reaction causing immediate rash, facial/tongue/throat swelling, SOB or lightheadedness with hypotension: unknown Has patient had a PCN reaction causing severe rash involving mucus membranes or skin necrosis: {unknown Has patient had a PCN reaction that required hospitalization {unknown Has patient had a PCN reaction occurring within the last 10 years: no If all of the above answers are "NO", then may proceed with Cephalosporin use.  . Sulfa Antibiotics Shortness Of Breath and Swelling    VITALS:  Blood pressure (!) 152/98, pulse 87, temperature 97.7 F (36.5 C), temperature source Oral, resp. rate 20, height 5\' 2"  (1.575 m), weight 85.9 kg (189 lb 4.8 oz), SpO2 94 %.  PHYSICAL EXAMINATION:    Physical Exam  GENERAL:  62 y.o.-year-old patient lying in the bed with no acute distress.  Looks older than her stated age. EYES: Pupils equal, round, reactive to light and accommodation. No scleral icterus. Extraocular muscles intact.  HEENT: Head atraumatic, normocephalic. Oropharynx and nasopharynx clear.  NECK:  Supple, no jugular venous distention. No thyroid enlargement, no tenderness.  LUNGS: Distant breath sounds bilaterally, + wheezing, no rales, rhonchi. No use of accessory muscles of respiration.  CARDIOVASCULAR: S1, S2 normal. No murmurs, rubs, or gallops.  ABDOMEN: Soft, nontender, nondistended. Bowel sounds present. No organomegaly or mass.  EXTREMITIES: No cyanosis, clubbing or edema b/l.    NEUROLOGIC: Cranial nerves II through XII are intact. No focal Motor or sensory deficits b/l.   PSYCHIATRIC:  patient is alert and oriented x 3.  SKIN: No obvious rash, lesion, or ulcer.   LABORATORY PANEL:  CBC Recent Labs  Lab 10/02/17 0629  WBC 7.8  HGB 14.4  HCT 44.5  PLT 95*    Chemistries  Recent Labs  Lab 10/01/17 0344 10/01/17 1701 10/02/17 0629  NA 140  --  139  K 3.7  --  4.8  CL 105  --  103  CO2 27  --  29  GLUCOSE 157*  --  122*  BUN 11  --  15  CREATININE 0.67  --  0.65  CALCIUM 8.3*  --  8.7*  MG 3.2* 2.1  --   AST 58*  --   --   ALT 61*  --   --   ALKPHOS 110  --   --   BILITOT 0.8  --   --    Cardiac Enzymes Recent  Labs  Lab 10/01/17 2217  TROPONINI <0.03   RADIOLOGY:  No results found. ASSESSMENT AND PLAN:  Bethany Villa  is a 62 y.o. female with a known history of COPD not on home oxygen, degenerative joint disease, GERD, hyperlipidemia, fibromyalgia presented to the emergency room with increased shortness of breath for the last 2 days.  Patient became more short of breath yesterday and could not even go to the restroom.  She her O2 saturation was around mid 70s when she came to the emergency room  1.  Acute hypoxic respiratory failure  secondary to COPD exacerbation with ongoing heavy tobacco abuse. -Patient was on BiPAP now using as needed-- HFNC 40%--now on [place] Continue oxygen wean as tolerated. -assess for home oxygen. -IV Solu-Medrol wean, nebulizer, inhalers, empiric Levaquin. -Incentive spirometer  2.  Anxiety -xanax.  She is trying to quit smoking.  3.  GERD continue PPI  4.  Abnormal LFTs.  Patient asymptomatic.  Trending down.  5.  DVT prophylaxis subcu Lovenox  6.  Heavy tobacco abuse.  Patient advised on smoking cessation.  Offered nicotine patch.  4 minutes spent.  Case discussed with Care Management/Social Worker. Management plans discussed with the patient, family and they are in agreement.  CODE STATUS: Full  DVT Prophylaxis: lovenox  TOTAL TIME TAKING CARE OF THIS PATIENT: *30* minutes.  >50% time spent on counselling and coordination of care  POSSIBLE D/C IN *1-2 DAYS, DEPENDING ON CLINICAL CONDITION.  Note: This dictation was prepared with Dragon dictation along with smaller phrase technology. Any transcriptional errors that result from this process are unintentional.  Enedina FinnerSona Shellby Schlink M.D on 10/04/2017 at 6:09 PM  Between 7am to 6pm - Pager - [phone]  After 6pm go to [email or web address] - password [email or web address] [email or web address]  [facility]  Office  [phone]  CC: Primary care physician; Patient, No Pcp PerPatient ID: Bethany Villa, female   DOB: 04/23/1956, 62 y.o.   MRN: [id number]

## 2017-10-04 NOTE — Progress Notes (Addendum)
[facility]. 1130 transferred to 132 via bed.

## 2017-10-04 NOTE — Progress Notes (Addendum)
  [facility] Progess Note    SYNOPSIS   In short patient with underlying history of severe COPD, prior history of pneumonia with recurrent hypoxemic respiratory failure, nicotine dependence presented with progressive shortness of breath consistent with COPD exacerbation. Has been treated on and off with noninvasive ventilation. Is also been on systemic steroids, albuterol, Atrovent and Levaquin coverage.   ASSESSMENT/PLAN   COPD exacerbation has done well over the last 24 hours.however this morning required additional use of BiPAP. We'll continue Solu-Medrol, albuterol, Atrovent and complete course of Levaquin. Will also consult palliative care to discuss with patient goals of care   VENTILATOR SETTINGS: FiO2 (%):  [40 %] 40 %  INTAKE / OUTPUT:  Intake/Output Summary (Last 24 hours) at 10/04/2017 0917 Last data filed at 10/04/2017 0100 Gross per 24 hour  Intake 3 ml  Output 1850 ml  Net -1847 ml    Name: Bethany Villa MRN: [id number] DOB: 01/16/1956    ADMISSION DATE:  09/30/2017  SUBJECTIVE:   Patient had a good day yesterday, minimal use of noninvasive [place] on nasal cannula  VITAL SIGNS: Temp:  [97.5 F (36.4 C)-98.1 F (36.7 C)] 98.1 F (36.7 C) (02/05 0800) Pulse Rate:  [87-121] 90 (02/05 0600) Resp:  [17-30] 24 (02/05 0600) BP: (114-168)/(52-130) 144/81 (02/05 0800) SpO2:  [95 %-99 %] 98 % (02/05 0600) FiO2 (%):  [40 %] 40 % (02/04 1600)  PHYSICAL EXAMINATION: Physical Examination:   VS: BP (!) 144/81   Pulse 90   Temp 98.1 F (36.7 C)   Resp (!) 24   Ht 5\' 3"  (1.6 m)   Wt 188 lb 0.8 oz (85.3 kg)   SpO2 98%   BMI 33.31 kg/m   General Appearance: No distress  Neuro:without focal findings, mental status normal. HEENT: PERRLA, EOM intact. Pulmonary: Diffuse bilateral expiratory wheezing noted Cardiovascular irregular rhythm tachycardia with a ventricular response of 100   Abdomen: Benign, Soft, non-tender. Skin:   warm, no  rashes, no ecchymosis  Extremities: normal, no cyanosis, clubbing.    LABORATORY PANEL:   CBC Recent Labs  Lab 10/02/17 0629  WBC 7.8  HGB 14.4  HCT 44.5  PLT 95*    Chemistries  Recent Labs  Lab 10/01/17 0344 10/01/17 1701 10/02/17 0629  NA 140  --  139  K 3.7  --  4.8  CL 105  --  103  CO2 27  --  29  GLUCOSE 157*  --  122*  BUN 11  --  15  CREATININE 0.67  --  0.65  CALCIUM 8.3*  --  8.7*  MG 3.2* 2.1  --   AST 58*  --   --   ALT 61*  --   --   ALKPHOS 110  --   --   BILITOT 0.8  --   --     Recent Labs  Lab 10/01/17 0220  GLUCAP 121*   Recent Labs  Lab 09/30/17 2330 10/01/17 0500  PHART 7.28* 7.33*  PCO2ART 63* 52*  PO2ART 161* 62*   Recent Labs  Lab 09/30/17 2330 10/01/17 0344  AST 63* 58*  ALT 65* 61*  ALKPHOS 116 110  BILITOT 0.7 0.8  ALBUMIN 3.5 3.4*    Cardiac Enzymes Recent Labs  Lab 10/01/17 2217  TROPONINI <0.03    RADIOLOGY:  No results found.    2/5/2019Patient ID: Bethany Villa, female   DOB: 10/24/1955, 62 y.o.   MRN: [id number]

## 2017-10-04 NOTE — Care Management Note (Addendum)
Case Management Note  Patient Details  Name: Bethany Villa MRN: [id number] Date of Birth: 07/10/1956  Subjective/Objective:                 [place]  and [place] not [place].  When asked fr more details relayed [place] been in [place] and lives with some friends. Says "I am trying to find a house."  She receives disability income which was just increased by 35 dollars a month to 1032.00. She does not have a pcp.  Current need for supplemental oxygen is acute. Says she use to get "special assistance" through medicare for her meds and special assistance paying for her medicare premium "but that stopped."  It sounds as though this has occurred within the last couple of months.  Action/Plan: Home 02 assessment prior to discharge.  Set up appointment with a pcp.  Expected Discharge Date:                  Expected Discharge Plan:     In-House Referral:     Discharge planning Services     Post Acute Care Choice:    Choice offered to:     DME Arranged:    DME Agency:     HH Arranged:    HH Agency:     Status of Service:     If discussed at [employer] of [employer] Meetings, dates discussed:    Additional Comments:  Eber HongGreene, Primus Gritton R, RN 10/04/2017, 9:58 AM

## 2017-10-04 NOTE — Evaluation (Signed)
Physical Therapy Evaluation Patient Details Name: Bethany Villa MRN: [id number] DOB: 09/15/1955 Today's Date: 10/04/2017   History of Present Illness  Pt admitted for COPD exacerbation. Pt complains of respiratory distress x 2 days. History includes COPD, no home O2, DJD, GERD, and fibromyalgia. Pt currently on 4L of O2  Clinical Impression  Pt is a pleasant 62 year old female who was admitted for COPD exacerbation. Pt performs bed mobility with independence, transfers with mod I, and ambulation with supervision without AD. Pt demonstrates deficits with strength/endurance. All mobility performed on 4L of O2 with sats decreasing from 93% to 88% with increased SOB symptoms and wheezing, limiting further mobility. Pt educated on short distances at home and pursed lip breathing. Pt currently not on home O2.  May benefit from energy conservation techniques, will consult with OT. Would benefit from skilled PT to address above deficits and promote optimal return to PLOF. Appears close to baseline status, will continue to see while in acute hospital stay to minimize effects of hospital acquired weakness, however pt agrees that she does not need formalized follow up PT after dc.      Follow Up Recommendations No PT follow up    Equipment Recommendations  None recommended by PT    Recommendations for Other Services       Precautions / Restrictions Precautions Precautions: Fall Restrictions Weight Bearing Restrictions: No      Mobility  Bed Mobility Overal bed mobility: Independent             General bed mobility comments: safe technique performed  Transfers Overall transfer level: Modified independent Equipment used: 1 person hand held assist             General transfer comment: safe technique performed with upright posture.  Ambulation/Gait Ambulation/Gait assistance: Supervision Ambulation Distance (Feet): 50 Feet Assistive device: None Gait Pattern/deviations: Step-to  pattern     General Gait Details: ambulated using no AD with supervision in room. Pt fatigues quickly with exertion and begins wheezing. No LOB noted, however cautious steps. Encouaged to continue mobility efforts with Engineer, manufacturingN staff.  Stairs            Wheelchair Mobility    Modified Rankin (Stroke Patients Only)       Balance Overall balance assessment: Independent                                           Pertinent Vitals/Pain Pain Assessment: No/denies pain    Home Living Family/patient expects to be discharged to:: Private residence Living Arrangements: Non-relatives/Friends(however reports she is about to move to her own place) Available Help at Discharge: Friend(s) Type of Home: Apartment Home Access: Level entry     Home Layout: One level Home Equipment: None      Prior Function Level of Independence: Independent         Comments: independent prior to admission, no falls reported     Hand Dominance        Extremity/Trunk Assessment   Upper Extremity Assessment Upper Extremity Assessment: Overall WFL for tasks assessed    Lower Extremity Assessment Lower Extremity Assessment: Generalized weakness(B LE grossly 4/5)       Communication   Communication: No difficulties  Cognition Arousal/Alertness: Awake/alert Behavior During Therapy: WFL for tasks assessed/performed Overall Cognitive Status: Within Functional Limits for tasks assessed  General Comments      Exercises Other Exercises Other Exercises: Seated ther-ex performed on B LE including hip add squeezes, hip abd/add, and LAQ. All ther-ex performed x 10 reps with supervision. Pt fatigues quickly. Educated on frequency and duration.   Assessment/Plan    PT Assessment Patient needs continued PT services  PT Problem List Decreased strength;Decreased mobility       PT Treatment Interventions Gait  training;Therapeutic exercise    PT Goals (Current goals can be found in the Care Plan section)  Acute Rehab PT Goals Patient Stated Goal: to go home PT Goal Formulation: With patient Time For Goal Achievement: 10/18/17 Potential to Achieve Goals: Good    Frequency Min 2X/week   Barriers to discharge        Co-evaluation               AM-PAC PT "6 Clicks" Daily Activity  Outcome Measure Difficulty turning over in bed (including adjusting bedclothes, sheets and blankets)?: None Difficulty moving from lying on back to sitting on the side of the bed? : None Difficulty sitting down on and standing up from a chair with arms (e.g., wheelchair, bedside commode, etc,.)?: None Help needed moving to and from a bed to chair (including a wheelchair)?: None Help needed walking in hospital room?: A Little Help needed climbing 3-5 steps with a railing? : A Little 6 Click Score: 22    End of Session Equipment Utilized During Treatment: Oxygen;Gait belt Activity Tolerance: Patient tolerated treatment well Patient left: in chair;with chair alarm set Nurse Communication: Mobility status PT Visit Diagnosis: Difficulty in walking, not elsewhere classified (R26.2);Muscle weakness (generalized) (M62.81)    Time: 1338-1400 PT Time Calculation (min) (ACUTE ONLY): 22 min   Charges:   PT Evaluation $PT Eval Low Complexity: 1 Low PT Treatments $Therapeutic Exercise: 8-22 mins   PT G Codes:        [name], PT, DPT [phone]   [name] 10/04/2017, 4:57 PM

## 2017-10-05 DIAGNOSIS — Z515 Encounter for palliative care: Secondary | ICD-10-CM

## 2017-10-05 LAB — CULTURE, BLOOD (ROUTINE X 2)
CULTURE: NO GROWTH
SPECIAL REQUESTS: ADEQUATE

## 2017-10-05 MED ORDER — TRAMADOL HCL 50 MG PO TABS
50.0000 mg | ORAL_TABLET | Freq: Four times a day (QID) | ORAL | Status: DC | PRN
  Administered 2017-10-06: 50 mg via ORAL
  Filled 2017-10-05: qty 1

## 2017-10-05 MED ORDER — PREDNISONE 50 MG PO TABS
50.0000 mg | ORAL_TABLET | Freq: Every day | ORAL | Status: DC
  Administered 2017-10-06 – 2017-10-07 (×2): 50 mg via ORAL
  Filled 2017-10-05 (×2): qty 1

## 2017-10-05 NOTE — Care Management Important Message (Signed)
Important Message  Patient Details  Name: Bethany Villa MRN: [id number] Date of Birth: 02/08/1956   Medicare Important Message Given:  Yes    Gwenette GreetBrenda S Juvon Teater, RN 10/05/2017, 6:52 AM

## 2017-10-05 NOTE — Consult Note (Signed)
Consultation Note Date: 10/05/2017   Patient Name: Bethany Villa  DOB: [birth year]  MRN: [id number]  Age / Sex: 62 y.o., female  PCP: Patient, No Pcp Per Referring Physician: Enedina Finner, MD  Reason for Consultation: Establishing goals of care and Psychosocial/spiritual support  HPI/Patient Profile: 61 y.o. female admitted on 09/30/2017 with a past medical history of COPD not on home oxygen, degenerative joint disease, GERD, hyperlipidemia, fibromyalgia, ongoing  tobacco use presented to the emergency room with increased shortness of breath for the last 2 days.    Patient became more short of breath yesterday and could not even go to the restroom.  She her O2 saturation was around mid 70s when she came to the emergency room.   Today patient speaks to dense social issues specific to housing, access to primary care, social support. CM aware.   She had a lot of wheezing in both the lungs.  Patient also has cough which is dry in nature.  Patient was put on BiPAP in the emergency room and stabilized.  She was given IV Solu-Medrol and multiple rounds of nebulization treatment.    She was admitted for further evaluation/treatment and stabilization.  Patient faces treatment option decisions, advanced directive decisions, and anticipatory care needs.    Clinical Assessment and Goals of Care:    This NP Lorinda Creed reviewed medical records, received report from team, assessed the patient and then meet at the patient's bedside   to discuss diagnosis, prognosis, GOC,  disposition and options.     Concept of  Palliative Care was discussed  We discussed the personal responsibilities that the patient has regarding their own health and wellness.  Educated patient on her need to speak to social services investigating the possible resources to augment her health and wellness.   A detailed discussion was had today regarding  advanced directives.  Concepts specific to code status, artifical feeding and hydration, continued IV antibiotics and rehospitalization was had.  The difference between a aggressive medical intervention path  and a palliative comfort care path for this patient at this time was had.  Values and goals of care important to patient and family were attempted to be elicited.  MOST form introduced.      Questions and concerns addressed.   Patient  encouraged to call with questions or concerns.    PMT will continue to support holistically.   No documented healthcare power of attorney or advanced directives.  Offered spiritual care services to secure, patient declines at this time    SUMMARY OF RECOMMENDATIONS    Code Status/Advance Care Planning:  Full code  Additional Recommendations (Limitations, Scope, Preferences):  Full Scope Treatment  Psycho-social/Spiritual:   Desire for further Chaplaincy support:no  Additional Recommendations: Referral to [facility] -Educated on opportunities for smoking cessation  Prognosis:   Unable to determine  Discharge Planning: To Be Determined      Primary Diagnoses: Present on Admission: . COPD exacerbation (HCC) . Respiratory failure (HCC)   I have reviewed the medical record,  interviewed the patient and family, and examined the patient. The following aspects are pertinent.  Past Medical History:  Diagnosis Date  . Anxiety   . Asthma   . Chronic pain    lower back  . COPD (chronic obstructive pulmonary disease) (HCC)   . Depression   . DJD (degenerative joint disease)   . Fibromyalgia   . GERD (gastroesophageal reflux disease)   . Headache   . Hyperlipidemia   . Lower extremity edema   . On home oxygen therapy    has not been on since 2016  . Panic attack   . Shortness of breath dyspnea   . Sleep disorder    Social History   Socioeconomic History  . Marital status: Divorced    Spouse name: None  . Number of  children: None  . Years of education: None  . Highest education level: None  Social Needs  . Financial resource strain: None  . Food insecurity - worry: None  . Food insecurity - inability: None  . Transportation needs - medical: None  . Transportation needs - non-medical: None  Occupational History  . Occupation: disabled  Tobacco Use  . Smoking status: Current Every Day Smoker    Packs/day: 1.00    Years: 45.00    Pack years: 45.00    Types: Cigarettes  . Smokeless tobacco: Never Used  Substance and Sexual Activity  . Alcohol use: Yes    Alcohol/week: 0.0 oz    Comment: rare  . Drug use: No  . Sexual activity: Yes    Birth control/protection: Surgical  Other Topics Concern  . None  Social History Narrative  . None   Family History  Problem Relation Age of Onset  . Cancer Mother        beast   Scheduled Meds: . ALPRAZolam  0.5 mg Oral TID  . atorvastatin  20 mg Oral q1800  . budesonide (PULMICORT) nebulizer solution  0.25 mg Nebulization BID  . chlorhexidine  15 mL Mouth Rinse BID  . enoxaparin (LOVENOX) injection  40 mg Subcutaneous Q24H  . ipratropium-albuterol  3 mL Nebulization Q4H  . levofloxacin  750 mg Oral Daily  . mouth rinse  15 mL Mouth Rinse q12n4p  . methylPREDNISolone (SOLU-MEDROL) injection  60 mg Intravenous Q12H  . mupirocin ointment  1 application Nasal BID  . nicotine  21 mg Transdermal Daily  . pantoprazole  40 mg Oral Daily  . topiramate  50 mg Oral Daily   Continuous Infusions: PRN Meds:.acetaminophen **OR** acetaminophen, guaiFENesin-codeine, ondansetron **OR** ondansetron (ZOFRAN) IV, phenol, senna-docusate, traMADol Medications Prior to Admission:  Prior to Admission medications   Medication Sig Start Date End Date Taking? Authorizing Provider  albuterol (PROVENTIL) (2.5 MG/3ML) 0.083% nebulizer solution TAKE 3 ML BY NEBULIZATION Q 6 hrs prn FOR WHEEZING OR SOB Dx J44.9 Patient not taking: Reported on 10/01/2017 09/07/16   Kathlen Mody,  MD  albuterol (VENTOLIN HFA) 108 (90 Base) MCG/ACT inhaler INHALE 2 PUFFS INTO THE LUNGS DAILY AS NEEDED FOR WHEEZING OR SHORTNESS OF BREATH. Patient not taking: Reported on 10/01/2017 06/08/17   Bennie Pierini, FNP  aspirin EC 325 MG tablet Take 1 tablet (325 mg total) by mouth daily. Patient not taking: Reported on 10/01/2017 09/07/16   Kathlen Mody, MD  atorvastatin (LIPITOR) 20 MG tablet TAKE 1 TABLET (20 MG TOTAL) BY MOUTH DAILY. Patient not taking: Reported on 10/01/2017 04/29/16   Bennie Pierini, FNP  doxylamine, Sleep, (SLEEP AID) 25 MG tablet Take 50 mg  by mouth at bedtime.    [provider]  fluticasone (FLONASE) 50 MCG/ACT nasal spray Place 2 sprays into both nostrils daily. Patient not taking: Reported on 10/01/2017 11/19/14   Bennie Pierini, FNP  fluticasone (FLONASE) 50 MCG/ACT nasal spray USE 2 SPRAYS INTO THE NOSE DAILY. Patient not taking: Reported on 10/01/2017 06/28/17   Bennie Pierini, FNP  furosemide (LASIX) 40 MG tablet Take 1 tablet (40 mg total) by mouth daily. Reported on 09/23/2015 Patient not taking: Reported on 10/01/2017 09/07/16   Kathlen Mody, MD  guaiFENesin-dextromethorphan [facility] DM) 100-10 MG/5ML syrup Take 5 mLs by mouth every 4 (four) hours as needed for cough. Patient not taking: Reported on 10/01/2017 09/07/16   Kathlen Mody, MD  ipratropium-albuterol (DUONEB) 0.5-2.5 (3) MG/3ML SOLN Take 3 mLs by nebulization every 6 (six) hours as needed. Patient not taking: Reported on 10/01/2017 09/07/16   Kathlen Mody, MD  levofloxacin (LEVAQUIN) 750 MG tablet Take 1 tablet (750 mg total) by mouth daily at 6 PM. Patient not taking: Reported on 10/01/2017 09/07/16   Kathlen Mody, MD  methocarbamol (ROBAXIN) 500 MG tablet Take 500 mg by mouth 2 (two) times daily. 08/28/16   [provider]  nystatin (MYCOSTATIN) 100000 UNIT/ML suspension Take 5 mLs (500,000 Units total) by mouth 4 (four) times daily. Patient not taking: Reported on 10/01/2017  09/09/16   Bennie Pierini, FNP  pantoprazole (PROTONIX) 40 MG tablet Take 1 tablet (40 mg total) by mouth daily. Patient not taking: Reported on 10/01/2017 09/08/16   Kathlen Mody, MD  predniSONE (DELTASONE) 20 MG tablet Prednisone 40 mg daily for 3 days followed by  Prednisone 20 mg daily for 3 days. Patient not taking: Reported on 10/01/2017 09/08/16   Kathlen Mody, MD  PROAIR HFA 108 [phone] Base) MCG/ACT inhaler INHALE 2 PUFFS INTO THE LUNGS EVERY SIX HOURS AS NEEDED FOR WHEEZING OR SHORTNESS OF BREATH Patient not taking: Reported on 10/01/2017 10/01/16   Bennie Pierini, FNP  topiramate (TOPAMAX) 50 MG tablet Take 1 tablet (50 mg total) by mouth daily. Patient not taking: Reported on 10/01/2017 09/08/16   Kathlen Mody, MD  traMADol (ULTRAM) 50 MG tablet TAKE TWO TABLETS BY MOUTH AT BEDTIME 08/10/16   [provider]   Allergies  Allergen Reactions  . Gabapentin Shortness Of Breath and Rash  . Mobic [Meloxicam] Anaphylaxis  . Penicillins Shortness Of Breath and Swelling    Has patient had a PCN reaction causing immediate rash, facial/tongue/throat swelling, SOB or lightheadedness with hypotension: unknown Has patient had a PCN reaction causing severe rash involving mucus membranes or skin necrosis: {unknown Has patient had a PCN reaction that required hospitalization {unknown Has patient had a PCN reaction occurring within the last 10 years: no If all of the above answers are "NO", then may proceed with Cephalosporin use.  . Sulfa Antibiotics Shortness Of Breath and Swelling   Review of Systems  Constitutional: Positive for fatigue.  Respiratory: Positive for shortness of breath.   Neurological: Positive for weakness.    Physical Exam  Constitutional: She is oriented to person, place, and time. She appears well-developed. She appears ill. Nasal cannula in place.  Cardiovascular: Tachycardia present.  Pulmonary/Chest: She has decreased breath sounds.  Neurological: She is  alert and oriented to person, place, and time.  Skin: Skin is warm and dry.    Vital Signs: BP (!) 159/95 (BP Location: Right Arm)   Pulse 100   Temp 97.7 F (36.5 C) (Oral)   Resp 20   Ht  5\' 2"  (1.575 m)   Wt 85.9 kg (189 lb 4.8 oz)   SpO2 95%   BMI 34.62 kg/m  Pain Assessment: 0-10   Pain Score: 7    SpO2: SpO2: 95 % O2 Device:SpO2: 95 % O2 Flow Rate: .O2 Flow Rate (L/min): 2 L/min  IO: Intake/output summary: No intake or output data in the 24 hours ending 10/05/17 1029  LBM: Last BM Date: 10/04/17 Baseline Weight: Weight: 85.7 kg (189 lb) Most recent weight: Weight: 85.9 kg (189 lb 4.8 oz)     Palliative Assessment/Data: 60%   Discussed with Dr Allena KatzPatel  Time In: 0815 Time Out: 0930 Time Total: 75 min Greater than 50%  of this time was spent counseling and coordinating care related to the above assessment and plan.  Signed by: Lorinda CreedMary Larach, NP   Please contact Palliative Medicine Team phone at [phone] for questions and concerns.  For individual provider: See Loretha StaplerAmion

## 2017-10-05 NOTE — Plan of Care (Signed)
VSS, free of falls during shift.  Reports chronic back pain 5-6/10, received PRN PO Tramadol 100mg  x2.  Received PRN PO Guaifenesin/Codeine 200-20mg  for cough.  No other complaints overnight. Ambulated to bathroom sb assist, tolerated well.  Bed in low position, call bell within reach.  WCTM.

## 2017-10-05 NOTE — Evaluation (Signed)
Occupational Therapy Evaluation Patient Details Name: Bethany Villa MRN: [id number] DOB: 1956/08/23 Today's Date: 10/05/2017    History of Present Illness Pt admitted for COPD exacerbation. Pt complains of respiratory distress x 2 days. History includes COPD, no home O2, DJD, GERD, and fibromyalgia. Pt currently on 3L of O2   Clinical Impression   Pt is a 62yo female who presents to [facility] hospital with COPD exacerbation. Pt independent at baseline, although endorses that her anxiety, chronic back pain from DJD, and fibromyalgia pain limit her ability to participate in ADL and IADL tasks as independently as she would like. Pt modified independent with basic ADL tasks, requiring additional effort to perform due to SOB, decreased endurance for functional tasks, and requiring 3L O2 with O2 sats and HR WNL. Pt educated in cognitive behavioral pain coping strategies for better pain mgt and energy conservation strategies to support improved COPD and anxiety mgt (please see additional detail below). Pt verbalized understanding. Handout provided. Pt would benefit from skilled OT services while in the hospital to increase independence with the implementation of energy conservation techniques, pursed lip breathing and recommendations for home modifications to increase safety and minimize risk of functional decline and rehospitalization.       Follow Up Recommendations  No OT follow up    Equipment Recommendations  None recommended by OT    Recommendations for Other Services       Precautions / Restrictions Precautions Precautions: Fall Restrictions Weight Bearing Restrictions: No      Mobility Bed Mobility Overal bed mobility: Independent                Transfers Overall transfer level: Modified independent                    Balance Overall balance assessment: Independent                                         ADL either performed or assessed with  clinical judgement   ADL Overall ADL's : Modified independent                                       General ADL Comments: Increased effort to perform.      Vision Baseline Vision/History: Wears glasses Wears Glasses: At all times Patient Visual Report: No change from baseline       Perception     Praxis      Pertinent Vitals/Pain Pain Assessment: No/denies pain     Hand Dominance Right   Extremity/Trunk Assessment Upper Extremity Assessment Upper Extremity Assessment: Overall WFL for tasks assessed   Lower Extremity Assessment Lower Extremity Assessment: Generalized weakness   Cervical / Trunk Assessment Cervical / Trunk Assessment: Normal   Communication Communication Communication: No difficulties   Cognition Arousal/Alertness: Awake/alert Behavior During Therapy: WFL for tasks assessed/performed;Anxious Overall Cognitive Status: Within Functional Limits for tasks assessed                                     General Comments       Exercises Other Exercises Other Exercises: Pt instructed in energy conservation strategies including AE/DME, body positioning, home/routines modifications, work simplification, activity pacing, falls prevention strategies, and pursed  lip breathing to support energy expenditure and minimize risk of over exertion or exaccerbation of COPD/SOB. Handout provided. Pt verablized understanding. Other Exercises: Pt educated in cognitive behavioral pain coping strategies including distraction, pleasant imagery, and activity pacing to support improved chronic pain mgt.    Shoulder Instructions      Home Living Family/patient expects to be discharged to:: Private residence Living Arrangements: Non-relatives/Friends(hoping to move into her own place in about a month) Available Help at Discharge: Friend(s) Type of Home: Apartment Home Access: Level entry     Home Layout: One level     Bathroom Shower/Tub:  Chief Strategy OfficerTub/shower unit   Bathroom Toilet: Standard     Home Equipment: Shower seat;Hand held shower head;Adaptive equipment Adaptive Equipment: Reacher;Long-handled shoe horn;Sock aid;Long-handled sponge        Prior Functioning/Environment Level of Independence: Independent        Comments: independent prior to admission, no falls reported. Enjoys music, drawing, word puzzles.         OT Problem List: Cardiopulmonary status limiting activity;Decreased activity tolerance      OT Treatment/Interventions: Self-care/ADL training;Therapeutic activities;Energy conservation;Patient/family education    OT Goals(Current goals can be found in the care plan section) Acute Rehab OT Goals Patient Stated Goal: to go home and feel more in control of my anxiety OT Goal Formulation: With patient Time For Goal Achievement: 10/19/17 Potential to Achieve Goals: Good ADL Goals Additional ADL Goal #1: Pt will demonstrate proper technique for pursed lip breathing during functional ADL and mobility tasks in order to minimize SOB/anxiety. Additional ADL Goal #2: Pt will verbalize plan for implementing at least 1 learned ECS in the home to support independence, safety, and minimize COPD exaccerbation.  OT Frequency: Min 1X/week   Barriers to D/C:            Co-evaluation              AM-PAC PT "6 Clicks" Daily Activity     Outcome Measure Help from another person eating meals?: None Help from another person taking care of personal grooming?: None Help from another person toileting, which includes using toliet, bedpan, or urinal?: None Help from another person bathing (including washing, rinsing, drying)?: A Little Help from another person to put on and taking off regular upper body clothing?: None Help from another person to put on and taking off regular lower body clothing?: A Little 6 Click Score: 22   End of Session    Activity Tolerance: Patient tolerated treatment well Patient left:  in bed;with call bell/phone within reach;with bed alarm set  OT Visit Diagnosis: Other abnormalities of gait and mobility (R26.89)                Time: [id number] OT Time Calculation (min): 42 min Charges:  OT General Charges $OT Visit: 1 Visit OT Evaluation $OT Eval Low Complexity: 1 Low OT Treatments $Self Care/Home Management : 23-37 mins  Richrd PrimeJamie Stiller, MPH, MS, OTR/L ascom [phone] 10/05/17, 5:38 PM

## 2017-10-05 NOTE — Progress Notes (Signed)
[facility]   PATIENT NAME: Bethany Villa    MR#:  [id number]  DATE OF BIRTH:  03/29/1956  SUBJECTIVE:  Pt doing ok. She says she is not ready for discharge as soon as I walk in her room! Now on [place] oxygen anxious REVIEW OF SYSTEMS:   Review of Systems  Constitutional: Negative for chills, fever and weight loss.  HENT: Negative for ear discharge, ear pain and nosebleeds.   Eyes: Negative for blurred vision, pain and discharge.  Respiratory: Negative for sputum production, shortness of breath, wheezing and stridor.   Cardiovascular: Negative for chest pain, palpitations, orthopnea and PND.  Gastrointestinal: Negative for abdominal pain, diarrhea, nausea and vomiting.  Genitourinary: Negative for frequency and urgency.  Musculoskeletal: Negative for back pain and joint pain.  Neurological: Negative for sensory change, speech change, focal weakness and weakness.  Psychiatric/Behavioral: Negative for depression and hallucinations. The patient is not nervous/anxious.    Tolerating Diet:yes Tolerating PT: NO PT f/u needed  DRUG ALLERGIES:   Allergies  Allergen Reactions  . Gabapentin Shortness Of Breath and Rash  . Mobic [Meloxicam] Anaphylaxis  . Penicillins Shortness Of Breath and Swelling    Has patient had a PCN reaction causing immediate rash, facial/tongue/throat swelling, SOB or lightheadedness with hypotension: unknown Has patient had a PCN reaction causing severe rash involving mucus membranes or skin necrosis: {unknown Has patient had a PCN reaction that required hospitalization {unknown Has patient had a PCN reaction occurring within the last 10 years: no If all of the above answers are "NO", then may proceed with Cephalosporin use.  . Sulfa Antibiotics Shortness Of Breath and Swelling    VITALS:  Blood pressure (!) 159/95, pulse 100, temperature 97.7 F (36.5 C), temperature source Oral, resp. rate 20, height 5\' 2"   (1.575 m), weight 85.9 kg (189 lb 4.8 oz), SpO2 95 %.  PHYSICAL EXAMINATION:   Physical Exam  GENERAL:  62 y.o.-year-old patient lying in the bed with no acute distress.  Looks older than her stated age. EYES: Pupils equal, round, reactive to light and accommodation. No scleral icterus. Extraocular muscles intact.  HEENT: Head atraumatic, normocephalic. Oropharynx and nasopharynx clear.  NECK:  Supple, no jugular venous distention. No thyroid enlargement, no tenderness.  LUNGS: Distant breath sounds bilaterally, + wheezing, no rales, rhonchi. No use of accessory muscles of respiration.  CARDIOVASCULAR: S1, S2 normal. No murmurs, rubs, or gallops.  ABDOMEN: Soft, nontender, nondistended. Bowel sounds present. No organomegaly or mass.  EXTREMITIES: No cyanosis, clubbing or edema b/l.    NEUROLOGIC: Cranial nerves II through XII are intact. No focal Motor or sensory deficits b/l.   PSYCHIATRIC:  patient is alert and oriented x 3.  SKIN: No obvious rash, lesion, or ulcer.   LABORATORY PANEL:  CBC Recent Labs  Lab 10/02/17 0629  WBC 7.8  HGB 14.4  HCT 44.5  PLT 95*    Chemistries  Recent Labs  Lab 10/01/17 0344 10/01/17 1701 10/02/17 0629  NA 140  --  139  K 3.7  --  4.8  CL 105  --  103  CO2 27  --  29  GLUCOSE 157*  --  122*  BUN 11  --  15  CREATININE 0.67  --  0.65  CALCIUM 8.3*  --  8.7*  MG 3.2* 2.1  --   AST 58*  --   --   ALT 61*  --   --   ALKPHOS 110  --   --  BILITOT 0.8  --   --    Cardiac Enzymes Recent Labs  Lab 10/01/17 2217  TROPONINI <0.03   RADIOLOGY:  No results found. ASSESSMENT AND PLAN:  [name]  is a 62 y.o. female with a known history of COPD not on home oxygen, degenerative joint disease, GERD, hyperlipidemia, fibromyalgia presented to the emergency room with increased shortness of breath for the last 2 days.  Patient became more short of breath yesterday and could not even go to the restroom.  She her O2 saturation was around mid  70s when she came to the emergency room  1.  Acute hypoxic respiratory failure secondary to COPD exacerbation with ongoing heavy tobacco abuse. -Patient was on BiPAP now using as needed-- HFNC 40%--now on [place] Continue oxygen wean as tolerated. -assess for home oxygen--pt refused to take oxygen home if she qualifies for it -IV Solu-Medrol wean--change to oral steroids, nebulizer, inhalers, empiric Levaquin. -Incentive spirometer  2.  Anxiety -xanax.  She is trying to quit smoking.  3.  GERD continue PPI  4.  Abnormal LFTs.  Patient asymptomatic.  Trending down.  5.  DVT prophylaxis subcu Lovenox  6.  Heavy tobacco abuse.  Patient advised on smoking cessation.  Offered nicotine patch.  4 minutes spent.  Spoke with pt that she is improving and if remains stable will d/c her tomorrow  Case discussed with Care Management/Social Worker. Management plans discussed with the patient and they are in agreement.  CODE STATUS: Full  DVT Prophylaxis: lovenox  TOTAL TIME TAKING CARE OF THIS PATIENT: *30* minutes.  >50% time spent on counselling and coordination of care  POSSIBLE D/C IN *1DAYS, DEPENDING ON CLINICAL CONDITION.  Note: This dictation was prepared with Dragon dictation along with smaller phrase technology. Any transcriptional errors that result from this process are unintentional.  Enedina Finner M.D on 10/05/2017 at 11:18 AM  Between 7am to 6pm - Pager - [phone]  After 6pm go to [email or web address] - password [email or web address]  [facility]  Office  [phone]  CC: Primary care physician; Patient, No Pcp PerPatient ID: Bethany Villa, female   DOB: 1956/07/10, 62 y.o.   MRN: [id number]

## 2017-10-06 DIAGNOSIS — F4322 Adjustment disorder with anxiety: Secondary | ICD-10-CM | POA: Diagnosis not present

## 2017-10-06 LAB — CBC
HEMATOCRIT: 46.7 % (ref 35.0–47.0)
HEMOGLOBIN: 15.1 g/dL (ref 12.0–16.0)
MCH: 29.8 pg (ref 26.0–34.0)
MCHC: 32.5 g/dL (ref 32.0–36.0)
MCV: 91.8 fL (ref 80.0–100.0)
Platelets: 109 10*3/uL — ABNORMAL LOW (ref 150–440)
RBC: 5.08 MIL/uL (ref 3.80–5.20)
RDW: 14.2 % (ref 11.5–14.5)
WBC: 11.8 10*3/uL — AB (ref 3.6–11.0)

## 2017-10-06 LAB — CREATININE, SERUM: Creatinine, Ser: 0.8 mg/dL (ref 0.44–1.00)

## 2017-10-06 MED ORDER — ALPRAZOLAM 0.5 MG PO TABS: 0.5000 mg | ORAL_TABLET | Freq: Two times a day (BID) | ORAL | 0 refills | Status: DC | PRN

## 2017-10-06 MED ORDER — ALPRAZOLAM 0.5 MG PO TABS
0.5000 mg | ORAL_TABLET | Freq: Three times a day (TID) | ORAL | Status: DC | PRN
  Administered 2017-10-06 – 2017-10-07 (×3): 0.5 mg via ORAL
  Filled 2017-10-06 (×4): qty 1

## 2017-10-06 MED ORDER — ALBUTEROL SULFATE (2.5 MG/3ML) 0.083% IN NEBU: INHALATION_SOLUTION | RESPIRATORY_TRACT | 2 refills | Status: DC

## 2017-10-06 MED ORDER — PREDNISONE 10 MG PO TABS: ORAL_TABLET | ORAL | 0 refills | Status: DC

## 2017-10-06 MED ORDER — SERTRALINE HCL 50 MG PO TABS
50.0000 mg | ORAL_TABLET | Freq: Every day | ORAL | Status: DC
  Administered 2017-10-07: 50 mg via ORAL
  Filled 2017-10-06: qty 1

## 2017-10-06 MED ORDER — TRAMADOL HCL 50 MG PO TABS
50.0000 mg | ORAL_TABLET | Freq: Two times a day (BID) | ORAL | Status: DC | PRN
  Administered 2017-10-07: 09:00:00 50 mg via ORAL
  Filled 2017-10-06: qty 1

## 2017-10-06 MED ORDER — NICOTINE 21 MG/24HR TD PT24: 21.0000 mg | MEDICATED_PATCH | Freq: Every day | TRANSDERMAL | 0 refills | Status: DC

## 2017-10-06 MED ORDER — TRAZODONE HCL 50 MG PO TABS
100.0000 mg | ORAL_TABLET | Freq: Every day | ORAL | Status: DC
  Administered 2017-10-06: 100 mg via ORAL
  Filled 2017-10-06 (×2): qty 2

## 2017-10-06 NOTE — Discharge Instructions (Signed)
Use your oxygen as prescribed  Establish primary care with Dr. Gearldine Bienenstockhristy Smith

## 2017-10-06 NOTE — Progress Notes (Signed)
PT Cancellation Note  Patient Details Name: Bethany Villa MRN: [id number] DOB: 11/16/1955   Cancelled Treatment:    Reason Eval/Treat Not Completed: Patient at procedure or test/unavailable   Attempted x 2 - bathing with nursing x 1, receiving breathing treatment second attempt.  Will continue as appropriate.   Danielle DessSarah Kanchan Gal 10/06/2017, 11:59 AM

## 2017-10-06 NOTE — Progress Notes (Signed)
SATURATION QUALIFICATIONS: (This note is used to comply with regulatory documentation for home oxygen)  Patient Saturations on Room Air at Rest = 88%  Patient Saturations on Room Air while Ambulating = 84%  Patient Saturations on 2 Liters of oxygen while Ambulating = 90%  Please briefly explain why patient needs home oxygen: COPD 

## 2017-10-06 NOTE — Discharge Summary (Signed)
[facility] [place] [facility]   PATIENT NAME: Bethany Villa    MR#:  [id number]  DATE OF BIRTH:  09/05/1955  DATE OF ADMISSION:  09/30/2017 ADMITTING PHYSICIAN: Ihor AustinPavan Pyreddy, MD  DATE OF DISCHARGE: 10/06/2017  PRIMARY CARE PHYSICIAN: Patient, No Pcp Per    ADMISSION DIAGNOSIS:  Respiratory distress [R06.03] Elevated troponin [R74.8] COPD exacerbation (HCC) [J44.1] Acute respiratory failure with hypoxia (HCC) [J96.01] Respiratory failure (HCC) [J96.90]  DISCHARGE DIAGNOSIS:  Acute respiratory failure with hypoxia COPD exacerbation Ongoing tobacco abuse Anxiety SECONDARY DIAGNOSIS:   Past Medical History:  Diagnosis Date  . Anxiety   . Asthma   . Chronic pain    lower back  . COPD (chronic obstructive pulmonary disease) (HCC)   . Depression   . DJD (degenerative joint disease)   . Fibromyalgia   . GERD (gastroesophageal reflux disease)   . Headache   . Hyperlipidemia   . Lower extremity edema   . On home oxygen therapy    has not been on since 2016  . Panic attack   . Shortness of breath dyspnea   . Sleep disorder     HOSPITAL COURSE:  Bethany Villa a62 y.o.femalewith a known history of COPD not on home oxygen, degenerative joint disease, GERD, hyperlipidemia, fibromyalgia presented to the emergency room with increased shortness of breath for the last 2 days. Patient became more short of breath yesterday and could not even go to the restroom. She her O2 saturation was around mid 70s when she came to the emergency room  1.  Acute hypoxic respiratory failure secondary to COPD exacerbation with ongoing heavy tobacco abuse. -Patient was on BiPAP now using as needed-- HFNC 40%--now on [place] Continue oxygen wean as tolerated. -pt qualifies for home oxygen. I have told her to establish PCP with Dr Katrinka BlazingSmith in the area -I will sign her oxygen order for one time---she voiced understanding -IV Solu-Medrol wean--change to oral steroids,  nebulizer, inhalers, empiric Levaquin. -Incentive spirometer  2.  Anxiety -xanax.  She is trying to quit smoking.  3.  GERD continue PPI  4.  Abnormal LFTs.  Patient asymptomatic.  Trending down.  5.  DVT prophylaxis subcu Lovenox  6.  Heavy tobacco abuse.  Patient advised on smoking cessation.  Offered nicotine patch.  4 minutes spent.  D/c home with oxygen  CONSULTS OBTAINED:  Treatment Team:  Annett Fabianichards, Karol A, MD  DRUG ALLERGIES:   Allergies  Allergen Reactions  . Gabapentin Shortness Of Breath and Rash  . Mobic [Meloxicam] Anaphylaxis  . Penicillins Shortness Of Breath and Swelling    Has patient had a PCN reaction causing immediate rash, facial/tongue/throat swelling, SOB or lightheadedness with hypotension: unknown Has patient had a PCN reaction causing severe rash involving mucus membranes or skin necrosis: {unknown Has patient had a PCN reaction that required hospitalization {unknown Has patient had a PCN reaction occurring within the last 10 years: no If all of the above answers are "NO", then may proceed with Cephalosporin use.  . Sulfa Antibiotics Shortness Of Breath and Swelling    DISCHARGE MEDICATIONS:   Allergies as of 10/06/2017      Reactions   Gabapentin Shortness Of Breath, Rash   Mobic [meloxicam] Anaphylaxis   Penicillins Shortness Of Breath, Swelling   Has patient had a PCN reaction causing immediate rash, facial/tongue/throat swelling, SOB or lightheadedness with hypotension: unknown Has patient had a PCN reaction causing severe rash involving mucus membranes or skin necrosis: {unknown Has patient had a  PCN reaction that required hospitalization {unknown Has patient had a PCN reaction occurring within the last 10 years: no If all of the above answers are "NO", then may proceed with Cephalosporin use.   Sulfa Antibiotics Shortness Of Breath, Swelling      Medication List    STOP taking these medications   fluticasone 50 MCG/ACT nasal  spray Commonly known as:  FLONASE   furosemide 40 MG tablet Commonly known as:  LASIX   guaiFENesin-dextromethorphan 100-10 MG/5ML syrup Commonly known as:  ROBITUSSIN DM   ipratropium-albuterol 0.5-2.5 (3) MG/3ML Soln Commonly known as:  DUONEB   levofloxacin 750 MG tablet Commonly known as:  LEVAQUIN   nystatin 100000 UNIT/ML suspension Commonly known as:  MYCOSTATIN   pantoprazole 40 MG tablet Commonly known as:  PROTONIX     TAKE these medications   albuterol 108 (90 Base) MCG/ACT inhaler Commonly known as:  VENTOLIN HFA INHALE 2 PUFFS INTO THE LUNGS DAILY AS NEEDED FOR WHEEZING OR SHORTNESS OF BREATH. What changed:  Another medication with the same name was removed. Continue taking this medication, and follow the directions you see here.   albuterol (2.5 MG/3ML) 0.083% nebulizer solution Commonly known as:  PROVENTIL TAKE 3 ML BY NEBULIZATION Q 6 hrs prn FOR WHEEZING OR SOB Dx J44.9 What changed:  Another medication with the same name was removed. Continue taking this medication, and follow the directions you see here.   ALPRAZolam 0.5 MG tablet Commonly known as:  XANAX Take 1 tablet (0.5 mg total) by mouth 2 (two) times daily as needed for anxiety.   aspirin EC 325 MG tablet Take 1 tablet (325 mg total) by mouth daily.   atorvastatin 20 MG tablet Commonly known as:  LIPITOR TAKE 1 TABLET (20 MG TOTAL) BY MOUTH DAILY.   methocarbamol 500 MG tablet Commonly known as:  ROBAXIN Take 500 mg by mouth 2 (two) times daily.   nicotine 21 mg/24hr patch Commonly known as:  NICODERM CQ - dosed in mg/24 hours Place 1 patch (21 mg total) onto the skin daily. Start taking on:  10/07/2017   predniSONE 10 MG tablet Commonly known as:  DELTASONE Take 50 mg taper by 10 mg daily then stop Start taking on:  10/07/2017 What changed:    medication strength  additional instructions   SLEEP AID 25 MG tablet Generic drug:  doxylamine (Sleep) Take 50 mg by mouth at bedtime.    topiramate 50 MG tablet Commonly known as:  TOPAMAX Take 1 tablet (50 mg total) by mouth daily.   traMADol 50 MG tablet Commonly known as:  ULTRAM TAKE TWO TABLETS BY MOUTH AT BEDTIME            Durable Medical Equipment  (From admission, onward)        Start     Ordered   10/06/17 1410  For home use only DME oxygen  Once    Question Answer Comment  Mode or (Route) Nasal cannula   Liters per Minute 2   Frequency Continuous (stationary and portable oxygen unit needed)   Oxygen conserving device Yes   Oxygen delivery system Gas      10/06/17 1409      If you experience worsening of your admission symptoms, develop shortness of breath, life threatening emergency, suicidal or homicidal thoughts you must seek medical attention immediately by calling 911 or calling your MD immediately  if symptoms less severe.  You Must read complete instructions/literature along with all the possible adverse reactions/side  effects for all the Medicines you take and that have been prescribed to you. Take any new Medicines after you have completely understood and accept all the possible adverse reactions/side effects.   Please note  You were cared for by a hospitalist during your hospital stay. If you have any questions about your discharge medications or the care you received while you were in the hospital after you are discharged, you can call the unit and asked to speak with the hospitalist on call if the hospitalist that took care of you is not available. Once you are discharged, your primary care physician will handle any further medical issues. Please note that NO REFILLS for any discharge medications will be authorized once you are discharged, as it is imperative that you return to your primary care physician (or establish a relationship with a primary care physician if you do not have one) for your aftercare needs so that they can reassess your need for medications and monitor your lab  values. Today   SUBJECTIVE   Doing ok  VITAL SIGNS:  Blood pressure 128/77, pulse 95, temperature 98.4 F (36.9 C), temperature source Oral, resp. rate 18, height 5\' 2"  (1.575 m), weight 85.9 kg (189 lb 4.8 oz), SpO2 (!) 88 %.  I/O:  No intake or output data in the 24 hours ending 10/06/17 1418  PHYSICAL EXAMINATION:  GENERAL:  62 y.o.-year-old patient lying in the bed with no acute distress. obese EYES: Pupils equal, round, reactive to light and accommodation. No scleral icterus. Extraocular muscles intact.  HEENT: Head atraumatic, normocephalic. Oropharynx and nasopharynx clear.  NECK:  Supple, no jugular venous distention. No thyroid enlargement, no tenderness.  LUNGS: Normal breath sounds bilaterally, no wheezing, rales,rhonchi or crepitation. No use of accessory muscles of respiration.  CARDIOVASCULAR: S1, S2 normal. No murmurs, rubs, or gallops.  ABDOMEN: Soft, non-tender, non-distended. Bowel sounds present. No organomegaly or mass.  EXTREMITIES: No pedal edema, cyanosis, or clubbing.  NEUROLOGIC: Cranial nerves II through XII are intact. Muscle strength 5/5 in all extremities. Sensation intact. Gait not checked.  PSYCHIATRIC: The patient is alert and oriented x 3.  SKIN: No obvious rash, lesion, or ulcer.   DATA REVIEW:   CBC  Recent Labs  Lab 10/06/17 0514  WBC 11.8*  HGB 15.1  HCT 46.7  PLT 109*    Chemistries  Recent Labs  Lab 10/01/17 0344 10/01/17 1701 10/02/17 0629 10/06/17 0514  NA 140  --  139  --   K 3.7  --  4.8  --   CL 105  --  103  --   CO2 27  --  29  --   GLUCOSE 157*  --  122*  --   BUN 11  --  15  --   CREATININE 0.67  --  0.65 0.80  CALCIUM 8.3*  --  8.7*  --   MG 3.2* 2.1  --   --   AST 58*  --   --   --   ALT 61*  --   --   --   ALKPHOS 110  --   --   --   BILITOT 0.8  --   --   --     Microbiology Results   Recent Results (from the past 240 hour(s))  Culture, blood (routine x 2)     Status: None   Collection Time: 09/30/17  11:30 PM  Result Value Ref Range Status   Specimen Description BLOOD BLOOD LEFT WRIST  Final   Special Requests   Final    BOTTLES DRAWN AEROBIC AND ANAEROBIC Blood Culture adequate volume   Culture   Final    NO GROWTH 5 DAYS Performed at [facility], [address] [place], [place] [address]    Report Status 10/05/2017 FINAL  Final  Culture, blood (routine x 2)     Status: Abnormal   Collection Time: 09/30/17 11:30 PM  Result Value Ref Range Status   Specimen Description   Final    BLOOD LEFT ANTECUBITAL Performed at [facility], [address], [place], [place] [address]    Special Requests   Final    BOTTLES DRAWN AEROBIC AND ANAEROBIC Blood Culture results may not be optimal due to an excessive volume of blood received in culture bottles Performed at [facility], [address], [place], [place] [address]    Culture  Setup Time   Final    GRAM POSITIVE RODS AEROBIC BOTTLE ONLY CRITICAL RESULT CALLED TO, READ BACK BY AND VERIFIED WITH: NATE COOKSON 10/02/17 @ 1733  MLK GRAM STAIN REVIEWED-AGREE WITH RESULT    Culture (A)  Final    DIPHTHEROIDS(CORYNEBACTERIUM SPECIES) Standardized susceptibility testing for this organism is not available. Performed at [facility], [address] [place], [place] [address]    Report Status 10/05/2017 FINAL  Final  Blood Culture ID Panel (Reflexed)     Status: None   Collection Time: 09/30/17 11:30 PM  Result Value Ref Range Status   Enterococcus species NOT DETECTED NOT DETECTED Final   Listeria monocytogenes NOT DETECTED NOT DETECTED Final   Staphylococcus species NOT DETECTED NOT DETECTED Final   Staphylococcus aureus NOT DETECTED NOT DETECTED Final   Streptococcus species NOT DETECTED NOT DETECTED Final   Streptococcus agalactiae NOT DETECTED NOT DETECTED Final   Streptococcus pneumoniae NOT DETECTED NOT DETECTED Final   Streptococcus pyogenes NOT DETECTED NOT DETECTED Final   Acinetobacter  baumannii NOT DETECTED NOT DETECTED Final   Enterobacteriaceae species NOT DETECTED NOT DETECTED Final   Enterobacter cloacae complex NOT DETECTED NOT DETECTED Final   Escherichia coli NOT DETECTED NOT DETECTED Final   Klebsiella oxytoca NOT DETECTED NOT DETECTED Final   Klebsiella pneumoniae NOT DETECTED NOT DETECTED Final   Proteus species NOT DETECTED NOT DETECTED Final   Serratia marcescens NOT DETECTED NOT DETECTED Final   Haemophilus influenzae NOT DETECTED NOT DETECTED Final   Neisseria meningitidis NOT DETECTED NOT DETECTED Final   Pseudomonas aeruginosa NOT DETECTED NOT DETECTED Final   Candida albicans NOT DETECTED NOT DETECTED Final   Candida glabrata NOT DETECTED NOT DETECTED Final   Candida krusei NOT DETECTED NOT DETECTED Final   Candida parapsilosis NOT DETECTED NOT DETECTED Final   Candida tropicalis NOT DETECTED NOT DETECTED Final    Comment: Performed at [facility], [address] [place], [place] [address]  MRSA PCR Screening     Status: Abnormal   Collection Time: 10/01/17  2:25 AM  Result Value Ref Range Status   MRSA by PCR POSITIVE (A) NEGATIVE Final    Comment:        The GeneXpert MRSA Assay (FDA approved for NASAL specimens only), is one component of a comprehensive MRSA colonization surveillance program. It is not intended to diagnose MRSA infection nor to guide or monitor treatment for MRSA infections. RESULT CALLED TO, READ BACK BY AND VERIFIED WITH: ANGELA BREHUN AT 3875 10/01/17.PMH Performed at [facility], [address], [place], [place] [address]  CULTURE, BLOOD (ROUTINE X 2) w Reflex to ID Panel     Status: None (Preliminary result)   Collection Time: 10/03/17  3:48 AM  Result Value Ref Range Status   Specimen Description BLOOD BLOOD RIGHT HAND  Final   Special Requests   Final    BOTTLES DRAWN AEROBIC AND ANAEROBIC Blood Culture adequate volume   Culture   Final    NO GROWTH 3 DAYS Performed at [facility], [address] [place], [place] [address]    Report Status PENDING  Incomplete  CULTURE, BLOOD (ROUTINE X 2) w Reflex to ID Panel     Status: None (Preliminary result)   Collection Time: 10/03/17  3:57 AM  Result Value Ref Range Status   Specimen Description BLOOD RIGHT ANTECUBITAL  Final   Special Requests   Final    BOTTLES DRAWN AEROBIC AND ANAEROBIC Blood Culture adequate volume   Culture   Final    NO GROWTH 3 DAYS Performed at [facility], [address], [place], [place] [address]    Report Status PENDING  Incomplete    RADIOLOGY:  No results found.   Management plans discussed with the patient, family and they are in agreement.  CODE STATUS:     Code Status Orders  (From admission, onward)        Start     Ordered   10/01/17 0224  Full code  Continuous     10/01/17 0223    Code Status History    Date Active Date Inactive Code Status Order ID Comments User Context   09/01/2016 20:05 09/07/2016 18:08 Full Code [id number]  Jonah Blue, MD Inpatient   04/14/2016 11:51 04/15/2016 14:23 Full Code [id number]  Deeann Saint, MD Inpatient   07/24/2014 09:56 07/28/2014 21:47 Full Code [id number]  Tineka Pia, MD Inpatient   09/29/2013 22:03 10/03/2013 19:54 Full Code [id number]  Osvaldo Shipper, MD Inpatient   05/05/2013 17:32 05/08/2013 14:19 Full Code [id number]  Wilson Singer, MD ED   11/27/2012 19:55 12/06/2012 18:57 Full Code [id number]  Erick Blinks, MD Inpatient   10/03/2011 17:01 10/08/2011 15:08 Full Code [id number]  Erick Blinks, MD Inpatient   08/09/2011 21:18 08/11/2011 13:41 Full Code [id number]  Tarry Kos A Inpatient   05/19/2011 22:39 05/23/2011 16:46 Full Code [id number]  Arlyss Queen C Inpatient      TOTAL TIME TAKING CARE OF THIS PATIENT: 40 minutes.    Enedina Finner M.D on 10/06/2017 at 2:18 PM  Between 7am to 6pm - Pager - [phone] After 6pm go to [email or web address] - password [employer]  [facility]  Office   [phone]  CC: Primary care physician; Patient, No Pcp Per

## 2017-10-06 NOTE — Progress Notes (Signed)
Went in pt room to talk to her about issues she was having with housing. Pt stated there person she lived with said she could no longer live there and would only have a day or two left at best. Pt became very tearful, and stated " I'm having horrible thoughts." When I asked her to elaborate she stated "Really bad ones." I then asked her if it was anything I needed to worry about her harming herself over and she stated "No. I'm too chicken, but life would probably be better off without me." I comforted pt and then contacted Charge Nurse Orson Apeanielle Jacobs, leaving Nurse Tech Katie close by pt. MD Enedina FinnerSona Patel notified per text page and call back. Safety sitter ordered and psych consult placed. Pts room was cleans of all things that could cause harm.

## 2017-10-06 NOTE — Progress Notes (Signed)
Occupational Therapy Treatment Patient Details Name: Basilio Cairoenny E Pinales MRN: [id number] DOB: 04/23/1956 Today's Date: 10/06/2017    History of present illness Pt admitted for COPD exacerbation. Pt complains of respiratory distress x 2 days. History includes COPD, no home O2, DJD, GERD, and fibromyalgia. Pt currently on 3L of O2   OT comments  Pt seen for OT tx this date. Pt instructed in ECS and pursed lip breathing to maximize recall and carryover of previously learned training and education. Pt required verbal cues for PLB and to readjust nasal canula. Pt continues to benefit from skilled OT services to support return to PLOF and minimize risk of readmission for COPD exacerbation. Will continue to progress. RN notified of pt's request for anxiety medication.    Follow Up Recommendations  No OT follow up    Equipment Recommendations  None recommended by OT    Recommendations for Other Services      Precautions / Restrictions Precautions Precautions: Fall Restrictions Weight Bearing Restrictions: No       Mobility Bed Mobility Overal bed mobility: Independent                Transfers Overall transfer level: Modified independent                    Balance Overall balance assessment: Independent                                         ADL either performed or assessed with clinical judgement   ADL Overall ADL's : Modified independent                                       General ADL Comments: Increased effort to perform. Increased SOB with minimal exertion.     Vision Baseline Vision/History: Wears glasses Wears Glasses: At all times Patient Visual Report: No change from baseline     Perception     Praxis      Cognition Arousal/Alertness: Awake/alert Behavior During Therapy: Anxious Overall Cognitive Status: Within Functional Limits for tasks assessed                                           Exercises Other Exercises Other Exercises: Pt re-educated in energy conservation strategies to maximize recall and carryover of learned techniques to minimize over exertion and SOB while maximizing independence. Pt verbalized understanding. Required verbal cues to utilize pursed lip breathing.   Shoulder Instructions       General Comments      Pertinent Vitals/ Pain       Pain Assessment: No/denies pain  Home Living                                          Prior Functioning/Environment              Frequency  Min 1X/week        Progress Toward Goals  OT Goals(current goals can now be found in the care plan section)  Progress towards OT goals: Progressing toward goals  Acute Rehab OT Goals Patient Stated Goal:  to go home and feel more in control of my anxiety OT Goal Formulation: With patient Time For Goal Achievement: 10/19/17 Potential to Achieve Goals: Good  Plan Discharge plan remains appropriate;Frequency remains appropriate    Co-evaluation                 AM-PAC PT "6 Clicks" Daily Activity     Outcome Measure   Help from another person eating meals?: None Help from another person taking care of personal grooming?: None Help from another person toileting, which includes using toliet, bedpan, or urinal?: None Help from another person bathing (including washing, rinsing, drying)?: A Little Help from another person to put on and taking off regular upper body clothing?: None Help from another person to put on and taking off regular lower body clothing?: A Little 6 Click Score: 22    End of Session    OT Visit Diagnosis: Other abnormalities of gait and mobility (R26.89)   Activity Tolerance Patient tolerated treatment well   Patient Left in bed;with call bell/phone within reach   Nurse Communication          Time: 1610-9604 OT Time Calculation (min): 13 min  Charges: OT General Charges $OT Visit: 1 Visit OT  Treatments $Self Care/Home Management : 8-22 mins  Richrd Prime, MPH, MS, OTR/L ascom [phone] 10/06/17, 10:10 AM

## 2017-10-06 NOTE — Progress Notes (Signed)
Patient ID: Bethany Villa, female   DOB: 11/05/1955, 62 y.o.   MRN: [id number] Discharge held since patient states she cannot return to her friend's place.  She mentioned to the nurse she is having some thoughts.  Nursing supervisor recommend sitter.  Psych consult placed.  Care management for discharge planning.

## 2017-10-06 NOTE — Plan of Care (Signed)
  Progressing Coping: Level of anxiety will decrease 10/06/2017 2047 - Progressing by Stefan Churchogers, Vergil Burby M, RN Respiratory: Ability to maintain a clear airway will improve 10/06/2017 2052 - Progressing by Stefan Churchogers, Kamin Niblack M, RN

## 2017-10-06 NOTE — Plan of Care (Signed)
VSS, free of falls during shift.  Denies pain.  Epistaxis, added humidity to [place], no additional interventions needed.  Bed in low position, call bell within reach.  WCTM.

## 2017-10-06 NOTE — Plan of Care (Signed)
  Progressing Coping: Level of anxiety will decrease 10/06/2017 2047 - Progressing by Stefan Churchogers, Jerrol Helmers M, RN

## 2017-10-06 NOTE — Care Management Note (Addendum)
Case Management Note  Patient Details  Name: Bethany Villa MRN: [id number] Date of Birth: 06/10/1956  Subjective/Objective:      Admitted to [facility] with the diagnosis of respiratory failure. Lives with friend, Morrie Sheldonshley. Denzil HughesDeana Hughes is another friend [phone]). Moved to this area from [place] 7 months ago. Prescriptions [facility] at [facility] [place]. No primary care physician. Home Health in the past, doesn't remember name of agency. No skilled facility. No home oxygen. Nebulizer in the home. Takes care of all basic activities of daily living herself, can't drive no license. No falls. Good appetite. Friend will transport   Discharge to home today per Dr. Allena KatzPatel            Action/Plan: Resources on physician's accepting new patients given.  Gets $132.00 month disability.  Qualifies for home oxygen. Dr. Allena KatzPatel will sign for the 1st 30 days.  Primary will need to sign after 30 days Refused Home Health services   Expected Discharge Date:                  Expected Discharge Plan:     In-House Referral:   yes  Discharge planning Services   yes  Post Acute Care Choice:   yes Choice offered to:   Patient  DME Arranged:   yes DME Agency:     HH Arranged:   yes [facility]   [facility]  Status of Service:     If discussed at Long Length of Stay Meetings, dates discussed:    Additional Comments:  Gwenette GreetBrenda S Raela Bohl, RN MSN CCM Care Management [phone] 10/06/2017, 1:23 PM

## 2017-10-06 NOTE — Progress Notes (Signed)
[facility]  Spoke with patient regarding HH services, patient refused al [facility] services, only wanted O2. Informed Terrilee CroakBrenda Holland, RNCM.    Dimple CaseyJason E Hinton 10/06/2017, 4:03 PM

## 2017-10-06 NOTE — Consult Note (Signed)
[facility] Consult   Reason for Consult: Consult for 62 year old woman with a history of anxiety who expressed acute suicidal thoughts Referring Physician: Posey Pronto Patient Identification: Bethany Villa MRN:  [id number] Principal Diagnosis: Acute adjustment disorder with anxiety Diagnosis:   Patient Active Problem List   Diagnosis Date Noted  . Acute adjustment disorder with anxiety [F43.22] 10/06/2017  . Palliative care by specialist [Z51.5]   . Respiratory failure ([place]) [J96.90] 10/01/2017  . COPD exacerbation ([place]) [J44.1] 09/01/2016  . Elevated brain natriuretic peptide (BNP) level [R79.89] 09/01/2016  . LFT elevation [R94.5] 09/01/2016  . Total knee replacement status [Z96.659] 04/14/2016  . Chronic obstructive pulmonary disease with acute lower respiratory infection ([place]) [J44.0]   . Acute respiratory failure with hypoxia and hypercapnia ([facility]) [J96.01, J96.02]   . Sepsis ([place]) [A41.9] 09/03/2015  . CAP (community acquired pneumonia) [J18.9] 09/01/2015  . SIRS (systemic inflammatory response syndrome) ([place]) [R65.10] 07/24/2014  . COPD with acute exacerbation ([place]) [J44.1] 09/29/2013  . Insomnia [G47.00] 11/04/2012  . Generalized anxiety disorder [F41.1] 11/04/2012  . Depression [F32.9] 11/04/2012  . Fibromyalgia [M79.7] 11/04/2012  . Metabolic encephalopathy [D35.70] 10/03/2011  . COPD (chronic obstructive pulmonary disease) ([place]) [J44.9] 06/01/2011  . Smoking [F17.200] 05/19/2011  . Chronic pain disorder [G89.4] 05/19/2011  . Obesity [E66.9] 05/19/2011  . Hyperglycemia [R73.9] 05/19/2011    Total Time spent with patient: 1 hour  Subjective:   Bethany Villa is a 62 y.o. female patient admitted with "I just get so nervous".  HPI: Patient interviewed chart reviewed.  Patient evidently discovered this afternoon that there was a chance she could not return to live where she had been staying before coming into the hospital.  This threw her into a panic because  she has no place else to stay.  She made comments about wishing she were dead or thinking about hurting herself to nursing staff which prompted suicide precautions and the request for consult.  On interview with me this evening the patient acknowledges feeling very anxious.  She says that she is chronically anxious and always has had nerve problems with excessive worry.  She has difficulty sleeping most nights.  She feels negative a lot of the time.  Nevertheless, she was feeling pretty much at her baseline before this afternoon.  Apparently this afternoon she learned that there was a chance she could not return to live where she was staying before coming into the hospital.  She admits to me that she did not actually hear this directly from the people that she was staying with.  She is frightened of not having a safe place to live.  Patient does not report psychotic symptoms.  Does not report any substance abuse.  She denies absolutely having any intention or thought of really harming herself.  Social history: She had been living with some friends for the last few months.  She gets disability but had a difficult time finding a place she could rent and afford.  It sounds like the situation where she was living was not very comfortable and she was looking for new accommodations anyway.  She does have adult children that she is in contact with.  Medical history: History of COPD elevated blood sugars chronic pain obesity.  Substance abuse history: Denies recent or alcohol or drug abuse and denies any past history of alcohol or drug abuse  Past Psychiatric History: Patient reports she has had anxiety and mood problems for years.  She had 1 previous "suicide attempt" about  30 years ago but when she describes it it was more of a suicide threat she did not actually do anything to hurt herself.  She has been on medicines including Zoloft and Xanax in the past for her anxiety but is not currently receiving any  treatment.  Risk to Self: Is patient at risk for suicide?: No Risk to Others:   Prior Inpatient Therapy:   Prior Outpatient Therapy:    Past Medical History:  Past Medical History:  Diagnosis Date  . Anxiety   . Asthma   . Chronic pain    lower back  . COPD (chronic obstructive pulmonary disease) ([place])   . Depression   . DJD (degenerative joint disease)   . Fibromyalgia   . GERD (gastroesophageal reflux disease)   . Headache   . Hyperlipidemia   . Lower extremity edema   . On home oxygen therapy    has not been on since 2016  . Panic attack   . Shortness of breath dyspnea   . Sleep disorder     Past Surgical History:  Procedure Laterality Date  . ABDOMINAL SURGERY    . ANKLE SURGERY    . BACK SURGERY     lower  . CARPAL TUNNEL RELEASE    . CESAREAN SECTION    . KNEE SURGERY    . TONSILLECTOMY    . TOTAL KNEE ARTHROPLASTY Left 04/14/2016   Procedure: TOTAL KNEE ARTHROPLASTY;  Surgeon: Earnestine Leys, MD;  Location: [facility] ORS;  Service: Orthopedics;  Laterality: Left;  . TUBAL LIGATION     Family History:  Family History  Problem Relation Age of Onset  . Cancer Mother        beast   Family Psychiatric  History: She says there is a significant family history of anxiety and her mother attempted suicide Social History:  Social History   Substance and Sexual Activity  Alcohol Use Yes  . Alcohol/week: 0.0 oz   Comment: rare     Social History   Substance and Sexual Activity  Drug Use No    Social History   Socioeconomic History  . Marital status: Divorced    Spouse name: None  . Number of children: None  . Years of education: None  . Highest education level: None  Social Needs  . Financial resource strain: None  . Food insecurity - worry: None  . Food insecurity - inability: None  . Transportation needs - medical: None  . Transportation needs - non-medical: None  Occupational History  . Occupation: disabled  Tobacco Use  . Smoking status: Current  Every Day Smoker    Packs/day: 1.00    Years: 45.00    Pack years: 45.00    Types: Cigarettes  . Smokeless tobacco: Never Used  Substance and Sexual Activity  . Alcohol use: Yes    Alcohol/week: 0.0 oz    Comment: rare  . Drug use: No  . Sexual activity: Yes    Birth control/protection: Surgical  Other Topics Concern  . None  Social History Narrative  . None   Additional Social History:    Allergies:   Allergies  Allergen Reactions  . Gabapentin Shortness Of Breath and Rash  . Mobic [Meloxicam] Anaphylaxis  . Penicillins Shortness Of Breath and Swelling    Has patient had a PCN reaction causing immediate rash, facial/tongue/throat swelling, SOB or lightheadedness with hypotension: unknown Has patient had a PCN reaction causing severe rash involving mucus membranes or skin necrosis: {unknown Has patient  had a PCN reaction that required hospitalization {unknown Has patient had a PCN reaction occurring within the last 10 years: no If all of the above answers are "NO", then may proceed with Cephalosporin use.  . Sulfa Antibiotics Shortness Of Breath and Swelling    Labs:  Results for orders placed or performed during the hospital encounter of 09/30/17 (from the past 48 hour(s))  Creatinine, serum     Status: None   Collection Time: 10/06/17  5:14 AM  Result Value Ref Range   Creatinine, Ser 0.80 0.44 - 1.00 mg/dL   GFR calc non Af Amer >60 >60 mL/min   GFR calc Af Amer >60 >60 mL/min    Comment: (NOTE) The eGFR has been calculated using the CKD EPI equation. This calculation has not been validated in all clinical situations. eGFR's persistently <60 mL/min signify possible Chronic Kidney Disease. Performed at [facility], [address] [place], [place] [address]   CBC     Status: Abnormal   Collection Time: 10/06/17  5:14 AM  Result Value Ref Range   WBC 11.8 (H) 3.6 - 11.0 K/uL   RBC 5.08 3.80 - 5.20 MIL/uL   Hemoglobin 15.1 12.0 - 16.0 g/dL   HCT  46.7 35.0 - 47.0 %   MCV 91.8 80.0 - 100.0 fL   MCH 29.8 26.0 - 34.0 pg   MCHC 32.5 32.0 - 36.0 g/dL   RDW 14.2 11.5 - 14.5 %   Platelets 109 (L) 150 - 440 K/uL    Comment: Performed at [facility], [address] [place], [place] [address]    Current Facility-Administered Medications  Medication Dose Route Frequency Provider Last Rate Last Dose  . acetaminophen (TYLENOL) tablet 650 mg  650 mg Oral Q6H PRN Saundra Shelling, MD   650 mg at 10/02/17 1851   Or  . acetaminophen (TYLENOL) suppository 650 mg  650 mg Rectal Q6H PRN Saundra Shelling, MD      . ALPRAZolam Duanne Moron) tablet 0.5 mg  0.5 mg Oral TID PRN Fritzi Mandes, MD      . atorvastatin (LIPITOR) tablet 20 mg  20 mg Oral [id number] Saundra Shelling, MD   20 mg at 10/06/17 1836  . budesonide (PULMICORT) nebulizer solution 0.25 mg  0.25 mg Nebulization BID [place] Dragon, MD   0.25 mg at 10/06/17 0906  . chlorhexidine (PERIDEX) 0.12 % solution 15 mL  15 mL Mouth Rinse BID Lafayette Dragon, MD   15 mL at 10/06/17 1014  . enoxaparin (LOVENOX) injection 40 mg  40 mg Subcutaneous Q24H Saundra Shelling, MD   40 mg at 10/06/17 0528  . guaiFENesin-codeine 100-10 MG/5ML solution 10 mL  10 mL Oral Q4H PRN Dorene Sorrow S, NP   10 mL at 10/05/17 1508  . ipratropium-albuterol (DUONEB) 0.5-2.5 (3) MG/3ML nebulizer solution 3 mL  3 mL Nebulization Q4H Lafayette Dragon, MD   3 mL at 10/06/17 1553  . MEDLINE mouth rinse  15 mL Mouth Rinse q12n4p Lafayette Dragon, MD   15 mL at 10/05/17 1630  . nicotine (NICODERM CQ - dosed in mg/24 hours) patch 21 mg  21 mg Transdermal Daily Tukov, Magadalene S, NP   21 mg at 10/06/17 1017  . ondansetron (ZOFRAN) tablet 4 mg  4 mg Oral Q6H PRN Pyreddy, Reatha Harps, MD       Or  . ondansetron (ZOFRAN) injection 4 mg  4 mg Intravenous Q6H PRN Saundra Shelling, MD      . pantoprazole (  PROTONIX) EC tablet 40 mg  40 mg Oral Daily Pyreddy, Reatha Harps, MD   40 mg at 10/06/17 1014  . phenol (CHLORASEPTIC) mouth spray 1 spray  1  spray Mouth/Throat PRN Conforti, Ishaan Villamar, DO   1 spray at 10/03/17 1711  . predniSONE (DELTASONE) tablet 50 mg  50 mg Oral Q breakfast Fritzi Mandes, MD   50 mg at 10/06/17 1014  . senna-docusate (Senokot-S) tablet 1 tablet  1 tablet Oral QHS PRN Saundra Shelling, MD      . Derrill Memo ON 10/07/2017] sertraline (ZOLOFT) tablet 50 mg  50 mg Oral Daily Eliyahu Bille T, MD      . topiramate (TOPAMAX) tablet 50 mg  50 mg Oral Daily Pyreddy, Pavan, MD   50 mg at 10/06/17 1014  . traMADol (ULTRAM) tablet 50 mg  50 mg Oral Q12H PRN Fritzi Mandes, MD      . traZODone (DESYREL) tablet 100 mg  100 mg Oral QHS Yandel Zeiner, Madie Reno, MD        Musculoskeletal: Strength & Muscle Tone: within normal limits Gait & Station: normal Patient leans: N/A  Psychiatric Specialty Exam: Physical Exam  Nursing note and vitals reviewed. Constitutional: She appears well-developed and well-nourished.  HENT:  Head: Normocephalic and atraumatic.  Eyes: Conjunctivae are normal. Pupils are equal, round, and reactive to light.  Neck: Normal range of motion.  Cardiovascular: Regular rhythm and normal heart sounds.  Respiratory: She is in respiratory distress.  GI: Soft.  Musculoskeletal: Normal range of motion.  Neurological: She is alert.  Skin: Skin is warm and dry.  Psychiatric: Her speech is normal and behavior is normal. Judgment normal. Her mood appears anxious. Cognition and memory are normal. She expresses suicidal ideation. She expresses no suicidal plans.    Review of Systems  Constitutional: Negative.   HENT: Negative.   Eyes: Negative.   Respiratory: Negative.   Cardiovascular: Negative.   Gastrointestinal: Negative.   Musculoskeletal: Negative.   Skin: Negative.   Neurological: Negative.   Psychiatric/Behavioral: Positive for suicidal ideas. Negative for depression, hallucinations, memory loss and substance abuse. The patient is nervous/anxious and has insomnia.     Blood pressure (!) 145/62, pulse (!) 101,  temperature 98.7 F (37.1 C), temperature source Oral, resp. rate 18, height _0  (1.575 m), weight 85.9 kg (189 lb 4.8 oz), SpO2 (!) 88 %.Body mass index is 34.62 kg/m.  General Appearance: Casual  Eye Contact:  Good  Speech:  Clear and Coherent  Volume:  Normal  Mood:  Anxious  Affect:  Congruent  Thought Process:  Coherent  Orientation:  Full (Time, Place, and Person)  Thought Content:  Logical  Suicidal Thoughts:  Yes.  without intent/plan  Homicidal Thoughts:  No  Memory:  Immediate;   Good Recent;   Fair Remote;   Fair  Judgement:  Fair  Insight:  Fair  Psychomotor Activity:  Normal  Concentration:  Concentration: Fair  Recall:  [employer] of Knowledge:  Fair  Language:  Fair  Akathisia:  No  Handed:  Right  AIMS (if indicated):     Assets:  Communication Skills Desire for Improvement Financial Resources/Insurance Resilience  ADL's:  Intact  Cognition:  WNL  Sleep:        Treatment Plan Summary: Daily contact with patient to assess and evaluate symptoms and progress in treatment, Medication management and Plan 62 year old woman with chronic lung problems having an acute anxiety spell.  She is very clear with me that she has no intention or  plan of killing herself and is easily able to articulate positive things in her life.  Based on this I am discontinuing the suicide watch and precautions.  Patient does appear to have chronic anxiety issues and agrees to my plan to restart Zoloft 50 mg a day and also prescribed trazodone at night to help with sleep.  She agrees that she needs to get some clarity on where she will stay.  I am going to make sure there is a social work consult to try and assist her with appropriate living circumstances tomorrow.  Patient agrees to the whole plan.  Disposition: No evidence of imminent risk to self or others at present.   Patient does not meet criteria for psychiatric inpatient admission. Supportive therapy provided about ongoing  stressors.  Alethia Berthold, MD 10/06/2017 7:29 PM

## 2017-10-07 MED ORDER — TRAZODONE HCL 100 MG PO TABS: 100.0000 mg | ORAL_TABLET | Freq: Every day | ORAL | 0 refills | Status: DC

## 2017-10-07 MED ORDER — SERTRALINE HCL 50 MG PO TABS: 50.0000 mg | ORAL_TABLET | Freq: Every day | ORAL | 0 refills | Status: DC

## 2017-10-07 NOTE — Discharge Summary (Signed)
[facility] [place] at [facility]   PATIENT NAME: Bethany Villa    MR#:  [id number]  DATE OF BIRTH:  November 26, 1955  DATE OF ADMISSION:  09/30/2017 ADMITTING PHYSICIAN: Ihor Austin, MD  DATE OF DISCHARGE: 10/06/2017  PRIMARY CARE PHYSICIAN: Patient, No Pcp Per    ADMISSION DIAGNOSIS:  Respiratory distress [R06.03] Elevated troponin [R74.8] COPD exacerbation (HCC) [J44.1] Acute respiratory failure with hypoxia (HCC) [J96.01] Respiratory failure (HCC) [J96.90]  DISCHARGE DIAGNOSIS:  Acute respiratory failure with hypoxia COPD exacerbation Ongoing tobacco abuse Anxiety--chronic SECONDARY DIAGNOSIS:   Past Medical History:  Diagnosis Date  . Anxiety   . Asthma   . Chronic pain    lower back  . COPD (chronic obstructive pulmonary disease) (HCC)   . Depression   . DJD (degenerative joint disease)   . Fibromyalgia   . GERD (gastroesophageal reflux disease)   . Headache   . Hyperlipidemia   . Lower extremity edema   . On home oxygen therapy    has not been on since 2016  . Panic attack   . Shortness of breath dyspnea   . Sleep disorder     HOSPITAL COURSE:  PennyMilleris a61 y.o.femalewith a known history of COPD not on home oxygen, degenerative joint disease, GERD, hyperlipidemia, fibromyalgia presented to the emergency room with increased shortness of breath for the last 2 days. Patient became more short of breath yesterday and could not even go to the restroom. She her O2 saturation was around mid 70s when she came to the emergency room  1.  Acute hypoxic respiratory failure secondary to COPD exacerbation with ongoing heavy tobacco abuse. -Patient was on BiPAP now using as needed-- HFNC 40%--now on [place] Continue oxygen wean as tolerated. -pt qualifies for home oxygen. I have told her to establish PCP with Dr Katrinka Blazing in the area -I will sign her oxygen order for one time---she voiced understanding -IV Solu-Medrol wean--change to oral  steroids, nebulizer, inhaler -Incentive spirometer -sats today 90% on RA  2.  Anxiety -xanax.  She is trying to quit smoking. -started on Zoloft and trazodone by Dr Toni Amend  3.GERD continue PPI  4.DVT prophylaxis subcu Lovenox  5.Heavy tobacco abuse.  Patient advised on smoking cessation.  Offered nicotine patch.  4 minutes spent.  D/c home today  CONSULTS OBTAINED:  Treatment Team:  Annett Fabian, MD [place], Jackquline Denmark, MD  DRUG ALLERGIES:   Allergies  Allergen Reactions  . Gabapentin Shortness Of Breath and Rash  . Mobic [Meloxicam] Anaphylaxis  . Penicillins Shortness Of Breath and Swelling    Has patient had a PCN reaction causing immediate rash, facial/tongue/throat swelling, SOB or lightheadedness with hypotension: unknown Has patient had a PCN reaction causing severe rash involving mucus membranes or skin necrosis: {unknown Has patient had a PCN reaction that required hospitalization {unknown Has patient had a PCN reaction occurring within the last 10 years: no If all of the above answers are "NO", then may proceed with Cephalosporin use.  . Sulfa Antibiotics Shortness Of Breath and Swelling    DISCHARGE MEDICATIONS:   Allergies as of 10/07/2017      Reactions   Gabapentin Shortness Of Breath, Rash   Mobic [meloxicam] Anaphylaxis   Penicillins Shortness Of Breath, Swelling   Has patient had a PCN reaction causing immediate rash, facial/tongue/throat swelling, SOB or lightheadedness with hypotension: unknown Has patient had a PCN reaction causing severe rash involving mucus membranes or skin necrosis: {unknown Has patient had a PCN reaction that  required hospitalization {unknown Has patient had a PCN reaction occurring within the last 10 years: no If all of the above answers are "NO", then may proceed with Cephalosporin use.   Sulfa Antibiotics Shortness Of Breath, Swelling      Medication List    STOP taking these medications   fluticasone 50 MCG/ACT  nasal spray Commonly known as:  FLONASE   furosemide 40 MG tablet Commonly known as:  LASIX   guaiFENesin-dextromethorphan 100-10 MG/5ML syrup Commonly known as:  ROBITUSSIN DM   ipratropium-albuterol 0.5-2.5 (3) MG/3ML Soln Commonly known as:  DUONEB   levofloxacin 750 MG tablet Commonly known as:  LEVAQUIN   nystatin 100000 UNIT/ML suspension Commonly known as:  MYCOSTATIN   pantoprazole 40 MG tablet Commonly known as:  PROTONIX     TAKE these medications   albuterol 108 (90 Base) MCG/ACT inhaler Commonly known as:  VENTOLIN HFA INHALE 2 PUFFS INTO THE LUNGS DAILY AS NEEDED FOR WHEEZING OR SHORTNESS OF BREATH. What changed:  Another medication with the same name was removed. Continue taking this medication, and follow the directions you see here.   albuterol (2.5 MG/3ML) 0.083% nebulizer solution Commonly known as:  PROVENTIL TAKE 3 ML BY NEBULIZATION Q 6 hrs prn FOR WHEEZING OR SOB Dx J44.9 What changed:  Another medication with the same name was removed. Continue taking this medication, and follow the directions you see here.   ALPRAZolam 0.5 MG tablet Commonly known as:  XANAX Take 1 tablet (0.5 mg total) by mouth 2 (two) times daily as needed for anxiety.   aspirin EC 325 MG tablet Take 1 tablet (325 mg total) by mouth daily.   atorvastatin 20 MG tablet Commonly known as:  LIPITOR TAKE 1 TABLET (20 MG TOTAL) BY MOUTH DAILY.   methocarbamol 500 MG tablet Commonly known as:  ROBAXIN Take 500 mg by mouth 2 (two) times daily.   nicotine 21 mg/24hr patch Commonly known as:  NICODERM CQ - dosed in mg/24 hours Place 1 patch (21 mg total) onto the skin daily.   predniSONE 10 MG tablet Commonly known as:  DELTASONE Take 50 mg taper by 10 mg daily then stop What changed:    medication strength  additional instructions   sertraline 50 MG tablet Commonly known as:  ZOLOFT Take 1 tablet (50 mg total) by mouth daily. Start taking on:  10/08/2017   SLEEP AID 25  MG tablet Generic drug:  doxylamine (Sleep) Take 50 mg by mouth at bedtime.   topiramate 50 MG tablet Commonly known as:  TOPAMAX Take 1 tablet (50 mg total) by mouth daily.   traMADol 50 MG tablet Commonly known as:  ULTRAM TAKE TWO TABLETS BY MOUTH AT BEDTIME   traZODone 100 MG tablet Commonly known as:  DESYREL Take 1 tablet (100 mg total) by mouth at bedtime.            Durable Medical Equipment  (From admission, onward)        Start     Ordered   10/06/17 1410  For home use only DME oxygen  Once    Question Answer Comment  Mode or (Route) Nasal cannula   Liters per Minute 2   Frequency Continuous (stationary and portable oxygen unit needed)   Oxygen conserving device Yes   Oxygen delivery system Gas      10/06/17 1409      If you experience worsening of your admission symptoms, develop shortness of breath, life threatening emergency, suicidal or homicidal  thoughts you must seek medical attention immediately by calling 911 or calling your MD immediately  if symptoms less severe.  You Must read complete instructions/literature along with all the possible adverse reactions/side effects for all the Medicines you take and that have been prescribed to you. Take any new Medicines after you have completely understood and accept all the possible adverse reactions/side effects.   Please note  You were cared for by a hospitalist during your hospital stay. If you have any questions about your discharge medications or the care you received while you were in the hospital after you are discharged, you can call the unit and asked to speak with the hospitalist on call if the hospitalist that took care of you is not available. Once you are discharged, your primary care physician will handle any further medical issues. Please note that NO REFILLS for any discharge medications will be authorized once you are discharged, as it is imperative that you return to your primary care physician  (or establish a relationship with a primary care physician if you do not have one) for your aftercare needs so that they can reassess your need for medications and monitor your lab values. Today   SUBJECTIVE   Discharge held last pm since pt felt down and felt her friend will not take her back to the house. Wants to go today  VITAL SIGNS:  Blood pressure 129/73, pulse 93, temperature 98.1 F (36.7 C), temperature source Oral, resp. rate 17, height 5\' 2"  (1.575 m), weight 85.9 kg (189 lb 4.8 oz), SpO2 90 %.  I/O:  No intake or output data in the 24 hours ending 10/07/17 0909  PHYSICAL EXAMINATION:  GENERAL:  62 y.o.-year-old patient lying in the bed with no acute distress. obese EYES: Pupils equal, round, reactive to light and accommodation. No scleral icterus. Extraocular muscles intact.  HEENT: Head atraumatic, normocephalic. Oropharynx and nasopharynx clear.  NECK:  Supple, no jugular venous distention. No thyroid enlargement, no tenderness.  LUNGS: distant breath sounds bilaterally, no wheezing, rales,rhonchi or crepitation. No use of accessory muscles of respiration.  CARDIOVASCULAR: S1, S2 normal. No murmurs, rubs, or gallops.  ABDOMEN: Soft, non-tender, non-distended. Bowel sounds present. No organomegaly or mass.  EXTREMITIES: No pedal edema, cyanosis, or clubbing.  NEUROLOGIC: Cranial nerves II through XII are intact. Muscle strength 5/5 in all extremities. Sensation intact. Gait not checked.  PSYCHIATRIC: The patient is alert and oriented x 3.  SKIN: No obvious rash, lesion, or ulcer.   DATA REVIEW:   CBC  Recent Labs  Lab 10/06/17 0514  WBC 11.8*  HGB 15.1  HCT 46.7  PLT 109*    Chemistries  Recent Labs  Lab 10/01/17 0344 10/01/17 1701 10/02/17 0629 10/06/17 0514  NA 140  --  139  --   K 3.7  --  4.8  --   CL 105  --  103  --   CO2 27  --  29  --   GLUCOSE 157*  --  122*  --   BUN 11  --  15  --   CREATININE 0.67  --  0.65 0.80  CALCIUM 8.3*  --  8.7*   --   MG 3.2* 2.1  --   --   AST 58*  --   --   --   ALT 61*  --   --   --   ALKPHOS 110  --   --   --   BILITOT 0.8  --   --   --  Microbiology Results   Recent Results (from the past 240 hour(s))  Culture, blood (routine x 2)     Status: None   Collection Time: 09/30/17 11:30 PM  Result Value Ref Range Status   Specimen Description BLOOD BLOOD LEFT WRIST  Final   Special Requests   Final    BOTTLES DRAWN AEROBIC AND ANAEROBIC Blood Culture adequate volume   Culture   Final    NO GROWTH 5 DAYS Performed at [facility], [address] [place], [place] [address]    Report Status 10/05/2017 FINAL  Final  Culture, blood (routine x 2)     Status: Abnormal   Collection Time: 09/30/17 11:30 PM  Result Value Ref Range Status   Specimen Description   Final    BLOOD LEFT ANTECUBITAL Performed at [facility], [address], [place], [place] [address]    Special Requests   Final    BOTTLES DRAWN AEROBIC AND ANAEROBIC Blood Culture results may not be optimal due to an excessive volume of blood received in culture bottles Performed at [facility], [address] [place], [place] [address]    Culture  Setup Time   Final    GRAM POSITIVE RODS AEROBIC BOTTLE ONLY CRITICAL RESULT CALLED TO, READ BACK BY AND VERIFIED WITH: NATE COOKSON 10/02/17 @ 1733  MLK GRAM STAIN REVIEWED-AGREE WITH RESULT    Culture (A)  Final    DIPHTHEROIDS(CORYNEBACTERIUM SPECIES) Standardized susceptibility testing for this organism is not available. Performed at [facility], [address], [place], [place] [address]    Report Status 10/05/2017 FINAL  Final  Blood Culture ID Panel (Reflexed)     Status: None   Collection Time: 09/30/17 11:30 PM  Result Value Ref Range Status   Enterococcus species NOT DETECTED NOT DETECTED Final   Listeria monocytogenes NOT DETECTED NOT DETECTED Final   Staphylococcus species NOT DETECTED NOT DETECTED Final   Staphylococcus aureus  NOT DETECTED NOT DETECTED Final   Streptococcus species NOT DETECTED NOT DETECTED Final   Streptococcus agalactiae NOT DETECTED NOT DETECTED Final   Streptococcus pneumoniae NOT DETECTED NOT DETECTED Final   Streptococcus pyogenes NOT DETECTED NOT DETECTED Final   Acinetobacter baumannii NOT DETECTED NOT DETECTED Final   Enterobacteriaceae species NOT DETECTED NOT DETECTED Final   Enterobacter cloacae complex NOT DETECTED NOT DETECTED Final   Escherichia coli NOT DETECTED NOT DETECTED Final   Klebsiella oxytoca NOT DETECTED NOT DETECTED Final   Klebsiella pneumoniae NOT DETECTED NOT DETECTED Final   Proteus species NOT DETECTED NOT DETECTED Final   Serratia marcescens NOT DETECTED NOT DETECTED Final   Haemophilus influenzae NOT DETECTED NOT DETECTED Final   Neisseria meningitidis NOT DETECTED NOT DETECTED Final   Pseudomonas aeruginosa NOT DETECTED NOT DETECTED Final   Candida albicans NOT DETECTED NOT DETECTED Final   Candida glabrata NOT DETECTED NOT DETECTED Final   Candida krusei NOT DETECTED NOT DETECTED Final   Candida parapsilosis NOT DETECTED NOT DETECTED Final   Candida tropicalis NOT DETECTED NOT DETECTED Final    Comment: Performed at [facility], [address] [place], [place] [address]  MRSA PCR Screening     Status: Abnormal   Collection Time: 10/01/17  2:25 AM  Result Value Ref Range Status   MRSA by PCR POSITIVE (A) NEGATIVE Final    Comment:        The GeneXpert MRSA Assay (FDA approved for NASAL specimens only), is one component of a comprehensive MRSA colonization  surveillance program. It is not intended to diagnose MRSA infection nor to guide or monitor treatment for MRSA infections. RESULT CALLED TO, READ BACK BY AND VERIFIED WITH: ANGELA BREHUN AT 9604 10/01/17.PMH Performed at [facility], [address] [place], [place] [address]   CULTURE, BLOOD (ROUTINE X 2) w Reflex to ID Panel     Status: None (Preliminary result)    Collection Time: 10/03/17  3:48 AM  Result Value Ref Range Status   Specimen Description BLOOD BLOOD RIGHT HAND  Final   Special Requests   Final    BOTTLES DRAWN AEROBIC AND ANAEROBIC Blood Culture adequate volume   Culture   Final    NO GROWTH 4 DAYS Performed at [facility], [address] [place], [place] [address]    Report Status PENDING  Incomplete  CULTURE, BLOOD (ROUTINE X 2) w Reflex to ID Panel     Status: None (Preliminary result)   Collection Time: 10/03/17  3:57 AM  Result Value Ref Range Status   Specimen Description BLOOD RIGHT ANTECUBITAL  Final   Special Requests   Final    BOTTLES DRAWN AEROBIC AND ANAEROBIC Blood Culture adequate volume   Culture   Final    NO GROWTH 4 DAYS Performed at [facility], [address], [place], [place] [address]    Report Status PENDING  Incomplete    RADIOLOGY:  No results found.   Management plans discussed with the patient, family and they are in agreement.  CODE STATUS:     Code Status Orders  (From admission, onward)        Start     Ordered   10/01/17 0224  Full code  Continuous     10/01/17 0223    Code Status History    Date Active Date Inactive Code Status Order ID Comments User Context   09/01/2016 20:05 09/07/2016 18:08 Full Code [id number]  Jonah Blue, MD Inpatient   04/14/2016 11:51 04/15/2016 14:23 Full Code [id number]  Deeann Saint, MD Inpatient   07/24/2014 09:56 07/28/2014 21:47 Full Code [id number]  Julanne Pia, MD Inpatient   09/29/2013 22:03 10/03/2013 19:54 Full Code [id number]  Osvaldo Shipper, MD Inpatient   05/05/2013 17:32 05/08/2013 14:19 Full Code [id number]  Wilson Singer, MD ED   11/27/2012 19:55 12/06/2012 18:57 Full Code [id number]  Erick Blinks, MD Inpatient   10/03/2011 17:01 10/08/2011 15:08 Full Code [id number]  Erick Blinks, MD Inpatient   08/09/2011 21:18 08/11/2011 13:41 Full Code [id number]  Tarry Kos A Inpatient   05/19/2011 22:39 05/23/2011 16:46 Full Code [id number]   Arlyss Queen C Inpatient      TOTAL TIME TAKING CARE OF THIS PATIENT: 40 minutes.    Enedina Finner M.D on 10/07/2017 at 9:09 AM  Between 7am to 6pm - Pager - [phone] After 6pm go to [email or web address] - password [employer]  [facility]  Office  [phone]  CC: Primary care physician; Patient, No Pcp Per

## 2017-10-07 NOTE — Clinical Social Work Note (Signed)
Clinical Social Work Assessment  Patient Details  Name: Bethany Villa MRN: [id number] Date of Birth: [birth year]  Date of referral:  10/07/17               Reason for consult:  Housing Concerns/Homelessness                Permission sought to share information with:    Permission granted to share information::     Name::        Agency::     Relationship::     Contact Information:     Housing/Transportation Living arrangements for the past 2 months:  [address] [place] of Information:  Patient Patient Interpreter Needed:  None Criminal Activity/Legal Involvement Pertinent to Current Situation/Hospitalization:  No - Comment as needed Significant Relationships:  Friend Lives with:  Friends Do you feel safe going back to the place where you live?  Yes Need for family participation in patient care:  Yes (Comment)  Care giving concerns:  Patient reported that she lives at her friend Bethany Villa's house in [place].    Social Worker assessment / plan:  Holiday representative (CSW) received consult to provide housing resources. CSW met with patient alone at bedside to address consult. Psych MD cleared patient for discharge and patient is medically cleared for discharge. PT is recommending no follow up. RN is working on weaning patient off oxygen. Patient was alert and oriented X4 and was sitting up in the bed. CSW introduced self and explained role of CSW department. Patient reported that she has been living with her friend Bethany Villa in [place] for 6 months and it has always been temporary. Patient reported that Bethany Villa wants her to find another place to live. Per patient she has been looking for apartments but can't find one she can afford. Patient reported that she receives $1,132 per month for disability/ SSI. Patient reported that she was renting an apartment in [place], [place] before moving to [place]. Patient reported that she moved to [place] because the [place] apartment  had too many stairs. Patient reported that she has no other friends or family that she can stay with. Patient reported that she will have to go back to her friend Bethany Villa's home because all her clothes and belongings are there. CSW provided patient with a list of [place] resources including the [employer] and [place]. CSW also made patient aware of [facility] that can provide mental health services. Patient accepted resources and thanked CSW for visit.   Patient reported that her friend Bethany Villa has to work today from 2 pm to 10 pm and can possibly pick her up before she goes to work. CSW explained to patient that she will have to find a ride home today and that she is discharged. Patient verbalized her understanding.   If patient can't secure a ride then RN case manager will arrange EMS if patient needs oxygen. If patient is not on oxygen then a taxi voucher will be provided. RN case Freight forwarder and MD aware of above. Please reconsult if future social work needs arise. CSW signing off.   Employment status:  Disabled (Comment on whether or not currently receiving Disability) Insurance information:  Managed Medicare PT Recommendations:  No Follow Up Information / Referral to community resources:  Other (Comment Required)([employer])  Patient/Family's Response to care:  Patient accepted resources.   Patient/Family's Understanding of and Emotional Response to Diagnosis, Current Treatment, and Prognosis:  Patient thanked CSW for visit.  Emotional Assessment Appearance:  Appears stated age Attitude/Demeanor/Rapport:    Affect (typically observed):  Accepting, Pleasant, Stable Orientation:  Oriented to Self, Oriented to Place, Oriented to  Time, Oriented to Situation Alcohol / Substance use:  Not Applicable Psych involvement (Current and /or in the community):  No (Comment)  Discharge Needs  Concerns to be addressed:  Discharge Planning Concerns Readmission  within the last 30 days:  No Current discharge risk:  None Barriers to Discharge:  No Barriers Identified   Bethany Villa, Bethany Beets, LCSW 10/07/2017, 11:42 AM

## 2017-10-07 NOTE — Consult Note (Signed)
[name] Consult   Reason for Consult: Follow-up consult 62 year old woman with anxiety and depression Referring Physician: Posey Pronto Patient Identification: Bethany Villa:  [id number] Principal Diagnosis: Acute adjustment disorder with anxiety Diagnosis:   Patient Active Problem List   Diagnosis Date Noted  . Acute adjustment disorder with anxiety [F43.22] 10/06/2017  . Palliative care by specialist [Z51.5]   . Respiratory failure ([place]) [J96.90] 10/01/2017  . COPD exacerbation ([place]) [J44.1] 09/01/2016  . Elevated brain natriuretic peptide (BNP) level [R79.89] 09/01/2016  . LFT elevation [R94.5] 09/01/2016  . Total knee replacement status [Z96.659] 04/14/2016  . Chronic obstructive pulmonary disease with acute lower respiratory infection ([place]) [J44.0]   . Acute respiratory failure with hypoxia and hypercapnia (H[facility]) [J96.01, J96.02]   . Sepsis ([place]) [A41.9] 09/03/2015  . CAP (community acquired pneumonia) [J18.9] 09/01/2015  . SIRS (systemic inflammatory response syndrome) ([place]) [R65.10] 07/24/2014  . COPD with acute exacerbation ([place]) [J44.1] 09/29/2013  . Insomnia [G47.00] 11/04/2012  . Generalized anxiety disorder [F41.1] 11/04/2012  . Depression [F32.9] 11/04/2012  . Fibromyalgia [M79.7] 11/04/2012  . Metabolic encephalopathy [U31.49] 10/03/2011  . COPD (chronic obstructive pulmonary disease) ([place]) [J44.9] 06/01/2011  . Smoking [F17.200] 05/19/2011  . Chronic pain disorder [G89.4] 05/19/2011  . Obesity [E66.9] 05/19/2011  . Hyperglycemia [R73.9] 05/19/2011    Total Time spent with patient: 20 minutes  Subjective:   Bethany Villa is a 62 y.o. female patient admitted with "I am feeling much better".  HPI: See yesterday's note.  62 year old woman with a history of anxiety and depression who had an acute panic episode yesterday when she thought she would not be able to go back to live at the same place she had been staying before.  Patient was able to calm  down last night and is now denying any suicidal thoughts.  Has not acted out or had any behavior problems.  Started some medicine last night which has been tolerated fine.  Patient calmly is able to tell me how she is making plans to stay in a motel briefly and then proceed to find an appropriate place to stay.  History of anxiety and depression as detailed previously  Past Psychiatric History: History of anxiety and depression as detailed previously  Risk to Self: Is patient at risk for suicide?: No Risk to Others:   Prior Inpatient Therapy:   Prior Outpatient Therapy:    Past Medical History:  Past Medical History:  Diagnosis Date  . Anxiety   . Asthma   . Chronic pain    lower back  . COPD (chronic obstructive pulmonary disease) ([place])   . Depression   . DJD (degenerative joint disease)   . Fibromyalgia   . GERD (gastroesophageal reflux disease)   . Headache   . Hyperlipidemia   . Lower extremity edema   . On home oxygen therapy    has not been on since 2016  . Panic attack   . Shortness of breath dyspnea   . Sleep disorder     Past Surgical History:  Procedure Laterality Date  . ABDOMINAL SURGERY    . ANKLE SURGERY    . BACK SURGERY     lower  . CARPAL TUNNEL RELEASE    . CESAREAN SECTION    . KNEE SURGERY    . TONSILLECTOMY    . TOTAL KNEE ARTHROPLASTY Left 04/14/2016   Procedure: TOTAL KNEE ARTHROPLASTY;  Surgeon: Earnestine Leys, MD;  Location: [facility] ORS;  Service: Orthopedics;  Laterality: Left;  .  TUBAL LIGATION     Family History:  Family History  Problem Relation Age of Onset  . Cancer Mother        beast   Family Psychiatric  History: None Social History:  Social History   Substance and Sexual Activity  Alcohol Use Yes  . Alcohol/week: 0.0 oz   Comment: rare     Social History   Substance and Sexual Activity  Drug Use No    Social History   Socioeconomic History  . Marital status: Divorced    Spouse name: None  . Number of children: None  .  Years of education: None  . Highest education level: None  Social Needs  . Financial resource strain: None  . Food insecurity - worry: None  . Food insecurity - inability: None  . Transportation needs - medical: None  . Transportation needs - non-medical: None  Occupational History  . Occupation: disabled  Tobacco Use  . Smoking status: Current Every Day Smoker    Packs/day: 1.00    Years: 45.00    Pack years: 45.00    Types: Cigarettes  . Smokeless tobacco: Never Used  Substance and Sexual Activity  . Alcohol use: Yes    Alcohol/week: 0.0 oz    Comment: rare  . Drug use: No  . Sexual activity: Yes    Birth control/protection: Surgical  Other Topics Concern  . None  Social History Narrative  . None   Additional Social History:    Allergies:   Allergies  Allergen Reactions  . Gabapentin Shortness Of Breath and Rash  . Mobic [Meloxicam] Anaphylaxis  . Penicillins Shortness Of Breath and Swelling    Has patient had a PCN reaction causing immediate rash, facial/tongue/throat swelling, SOB or lightheadedness with hypotension: unknown Has patient had a PCN reaction causing severe rash involving mucus membranes or skin necrosis: {unknown Has patient had a PCN reaction that required hospitalization {unknown Has patient had a PCN reaction occurring within the last 10 years: no If all of the above answers are "NO", then may proceed with Cephalosporin use.  . Sulfa Antibiotics Shortness Of Breath and Swelling    Labs:  Results for orders placed or performed during the hospital encounter of 09/30/17 (from the past 48 hour(s))  Creatinine, serum     Status: None   Collection Time: 10/06/17  5:14 AM  Result Value Ref Range   Creatinine, Ser 0.80 0.44 - 1.00 mg/dL   GFR calc non Af Amer >60 >60 mL/min   GFR calc Af Amer >60 >60 mL/min    Comment: (NOTE) The eGFR has been calculated using the CKD EPI equation. This calculation has not been validated in all clinical  situations. eGFR's persistently <60 mL/min signify possible Chronic Kidney Disease. Performed at [facility], [address] [place], [place] [address]   CBC     Status: Abnormal   Collection Time: 10/06/17  5:14 AM  Result Value Ref Range   WBC 11.8 (H) 3.6 - 11.0 K/uL   RBC 5.08 3.80 - 5.20 MIL/uL   Hemoglobin 15.1 12.0 - 16.0 g/dL   HCT 46.7 35.0 - 47.0 %   MCV 91.8 80.0 - 100.0 fL   MCH 29.8 26.0 - 34.0 pg   MCHC 32.5 32.0 - 36.0 g/dL   RDW 14.2 11.5 - 14.5 %   Platelets 109 (L) 150 - 440 K/uL    Comment: Performed at [facility], [address], [place], [place] [address]  Current Facility-Administered Medications  Medication Dose Route Frequency Provider Last Rate Last Dose  . acetaminophen (TYLENOL) tablet 650 mg  650 mg Oral Q6H PRN Saundra Shelling, MD   650 mg at 10/02/17 1851   Or  . acetaminophen (TYLENOL) suppository 650 mg  650 mg Rectal Q6H PRN Saundra Shelling, MD      . ALPRAZolam Duanne Moron) tablet 0.5 mg  0.5 mg Oral TID PRN Fritzi Mandes, MD   0.5 mg at 10/07/17 1300  . atorvastatin (LIPITOR) tablet 20 mg  20 mg Oral q1800 Saundra Shelling, MD   20 mg at 10/06/17 1836  . budesonide (PULMICORT) nebulizer solution 0.25 mg  0.25 mg Nebulization BID Lafayette Dragon, MD   0.25 mg at 10/07/17 0859  . chlorhexidine (PERIDEX) 0.12 % solution 15 mL  15 mL Mouth Rinse BID Lafayette Dragon, MD   15 mL at 10/07/17 0853  . enoxaparin (LOVENOX) injection 40 mg  40 mg Subcutaneous Q24H Saundra Shelling, MD   40 mg at 10/07/17 0444  . guaiFENesin-codeine 100-10 MG/5ML solution 10 mL  10 mL Oral Q4H PRN Dorene Sorrow S, NP   10 mL at 10/05/17 1508  . ipratropium-albuterol (DUONEB) 0.5-2.5 (3) MG/3ML nebulizer solution 3 mL  3 mL Nebulization Q4H Lafayette Dragon, MD   3 mL at 10/07/17 1216  . MEDLINE mouth rinse  15 mL Mouth Rinse q12n4p Lafayette Dragon, MD   15 mL at 10/05/17 1630  . nicotine (NICODERM CQ - dosed in mg/24 hours) patch 21 mg  21 mg  Transdermal Daily Dorene Sorrow S, NP   21 mg at 10/07/17 0856  . ondansetron (ZOFRAN) tablet 4 mg  4 mg Oral Q6H PRN Pyreddy, Reatha Harps, MD       Or  . ondansetron (ZOFRAN) injection 4 mg  4 mg Intravenous Q6H PRN Pyreddy, Pavan, MD      . pantoprazole (PROTONIX) EC tablet 40 mg  40 mg Oral Daily Pyreddy, Reatha Harps, MD   40 mg at 10/07/17 0854  . phenol (CHLORASEPTIC) mouth spray 1 spray  1 spray Mouth/Throat PRN Conforti, Oluwadarasimi Redmon, DO   1 spray at 10/03/17 1711  . predniSONE (DELTASONE) tablet 50 mg  50 mg Oral Q breakfast Fritzi Mandes, MD   50 mg at 10/07/17 0853  . senna-docusate (Senokot-S) tablet 1 tablet  1 tablet Oral QHS PRN Pyreddy, Reatha Harps, MD      . sertraline (ZOLOFT) tablet 50 mg  50 mg Oral Daily Angie Hogg, Madie Reno, MD   50 mg at 10/07/17 0854  . topiramate (TOPAMAX) tablet 50 mg  50 mg Oral Daily Pyreddy, Reatha Harps, MD   50 mg at 10/07/17 0854  . traMADol (ULTRAM) tablet 50 mg  50 mg Oral Q12H PRN Fritzi Mandes, MD   50 mg at 10/07/17 0853  . traZODone (DESYREL) tablet 100 mg  100 mg Oral QHS Johnryan Sao, Madie Reno, MD   100 mg at 10/06/17 2142    Musculoskeletal: Strength & Muscle Tone: within normal limits Gait & Station: normal Patient leans: N/A  Psychiatric Specialty Exam: Physical Exam  Nursing note and vitals reviewed. Constitutional: She appears well-developed and well-nourished.  HENT:  Head: Normocephalic and atraumatic.  Eyes: Conjunctivae are normal. Pupils are equal, round, and reactive to light.  Neck: Normal range of motion.  Cardiovascular: Regular rhythm and normal heart sounds.  Respiratory: Effort normal. No respiratory distress.  GI: Soft.  Musculoskeletal: Normal range of motion.  Neurological: She is alert.  Skin: Skin is warm and  dry.  Psychiatric: She has a normal mood and affect. Her speech is normal. Judgment normal. She is slowed. Cognition and memory are normal. She expresses no suicidal ideation.    Review of Systems  Constitutional: Negative.   HENT: Negative.    Eyes: Negative.   Respiratory: Negative.   Cardiovascular: Negative.   Gastrointestinal: Negative.   Musculoskeletal: Negative.   Skin: Negative.   Neurological: Negative.   Psychiatric/Behavioral: Negative for depression, hallucinations, memory loss, substance abuse and suicidal ideas. The patient is not nervous/anxious and does not have insomnia.     Blood pressure 130/68, pulse 93, temperature 98.4 F (36.9 C), temperature source Oral, resp. rate 18, height _0  (1.575 m), weight 85.9 kg (189 lb 4.8 oz), SpO2 92 %.Body mass index is 34.62 kg/m.  General Appearance: Casual  Eye Contact:  Good  Speech:  Clear and Coherent  Volume:  Normal  Mood:  Euthymic  Affect:  Congruent  Thought Process:  Goal Directed  Orientation:  Full (Time, Place, and Person)  Thought Content:  Logical  Suicidal Thoughts:  No  Homicidal Thoughts:  No  Memory:  Immediate;   Fair Recent;   Fair Remote;   Fair  Judgement:  Fair  Insight:  Fair  Psychomotor Activity:  Decreased  Concentration:  Concentration: Fair  Recall:  [employer] of Knowledge:  Fair  Language:  Fair  Akathisia:  No  Handed:  Right  AIMS (if indicated):     Assets:  Desire for Improvement Resilience  ADL's:  Intact  Cognition:  WNL  Sleep:        Treatment Plan Summary: Plan No change to medication management.  Supportive counseling and therapy.  Urged her to follow-up with [facility] in the community.  No need for further follow-up at this time.  Disposition: Recommend psychiatric Inpatient admission when medically cleared. Patient does not meet criteria for psychiatric inpatient admission. Supportive therapy provided about ongoing stressors. Discussed crisis plan, support from social network, calling 911, coming to the Emergency Department, and calling Suicide Hotline.  Alethia Berthold, MD 10/07/2017 4:11 PM

## 2017-10-07 NOTE — Progress Notes (Signed)
Received MD order to discharge patient, reviewed home meds and instructions with patient and patient verbalized understanding

## 2017-10-07 NOTE — Care Management (Signed)
[name] states that her ride will be getting out of court shortly and will pick her up. Taxi voucher completed and placed on chart, if needed.  Bethany GreetBrenda s [name] RN MSN [facility] Care Management [phone]

## 2017-10-08 LAB — CULTURE, BLOOD (ROUTINE X 2)
CULTURE: NO GROWTH
Culture: NO GROWTH
SPECIAL REQUESTS: ADEQUATE
Special Requests: ADEQUATE

## 2017-10-08 NOTE — Progress Notes (Signed)
Pt left via taxi at about 1940. Did not observe any signs of distress at discharge.

## 2017-12-13 ENCOUNTER — Other Ambulatory Visit: Payer: Self-pay | Admitting: Physician Assistant

## 2017-12-13 DIAGNOSIS — G894 Chronic pain syndrome: Secondary | ICD-10-CM

## 2018-02-07 ENCOUNTER — Ambulatory Visit: Payer: Medicare Other | Admitting: Student in an Organized Health Care Education/Training Program

## 2018-03-21 ENCOUNTER — Ambulatory Visit: Payer: Medicare Other | Admitting: Student in an Organized Health Care Education/Training Program

## 2018-04-11 ENCOUNTER — Ambulatory Visit: Payer: Medicare Other | Admitting: Student in an Organized Health Care Education/Training Program

## 2018-05-16 ENCOUNTER — Encounter: Payer: Self-pay | Admitting: Student in an Organized Health Care Education/Training Program

## 2018-05-16 ENCOUNTER — Ambulatory Visit
Admission: RE | Admit: 2018-05-16 | Discharge: 2018-05-16 | Disposition: A | Discharge status: Home / Self Care | Payer: Medicare Other | Source: Ambulatory Visit | Attending: Student in an Organized Health Care Education/Training Program | Admitting: Student in an Organized Health Care Education/Training Program

## 2018-05-16 ENCOUNTER — Ambulatory Visit: Payer: Medicare Other | Admitting: Student in an Organized Health Care Education/Training Program

## 2018-05-16 ENCOUNTER — Other Ambulatory Visit: Payer: Self-pay

## 2018-05-16 VITALS — BP 124/72 | HR 66 | Temp 98.1°F | Resp 18 | Ht 63.0 in | Wt 180.0 lb

## 2018-05-16 DIAGNOSIS — G894 Chronic pain syndrome: Secondary | ICD-10-CM

## 2018-05-16 DIAGNOSIS — G8929 Other chronic pain: Secondary | ICD-10-CM | POA: Diagnosis not present

## 2018-05-16 DIAGNOSIS — F329 Major depressive disorder, single episode, unspecified: Secondary | ICD-10-CM | POA: Insufficient documentation

## 2018-05-16 DIAGNOSIS — E669 Obesity, unspecified: Secondary | ICD-10-CM | POA: Insufficient documentation

## 2018-05-16 DIAGNOSIS — J449 Chronic obstructive pulmonary disease, unspecified: Secondary | ICD-10-CM | POA: Diagnosis not present

## 2018-05-16 DIAGNOSIS — M797 Fibromyalgia: Secondary | ICD-10-CM | POA: Diagnosis not present

## 2018-05-16 DIAGNOSIS — M170 Bilateral primary osteoarthritis of knee: Secondary | ICD-10-CM | POA: Insufficient documentation

## 2018-05-16 DIAGNOSIS — M546 Pain in thoracic spine: Secondary | ICD-10-CM | POA: Insufficient documentation

## 2018-05-16 DIAGNOSIS — M542 Cervicalgia: Secondary | ICD-10-CM | POA: Insufficient documentation

## 2018-05-16 DIAGNOSIS — M25562 Pain in left knee: Secondary | ICD-10-CM

## 2018-05-16 DIAGNOSIS — F411 Generalized anxiety disorder: Secondary | ICD-10-CM

## 2018-05-16 DIAGNOSIS — Z6831 Body mass index (BMI) 31.0-31.9, adult: Secondary | ICD-10-CM | POA: Insufficient documentation

## 2018-05-16 DIAGNOSIS — F17210 Nicotine dependence, cigarettes, uncomplicated: Secondary | ICD-10-CM | POA: Insufficient documentation

## 2018-05-16 DIAGNOSIS — Z79899 Other long term (current) drug therapy: Secondary | ICD-10-CM | POA: Diagnosis not present

## 2018-05-16 DIAGNOSIS — M25551 Pain in right hip: Secondary | ICD-10-CM

## 2018-05-16 DIAGNOSIS — Z96652 Presence of left artificial knee joint: Secondary | ICD-10-CM

## 2018-05-16 MED ORDER — TIZANIDINE HCL 4 MG PO TABS: 4.0000 mg | ORAL_TABLET | Freq: Two times a day (BID) | ORAL | 1 refills | Status: DC | PRN

## 2018-05-16 NOTE — Progress Notes (Signed)
Patient's Name: Bethany Villa  MRN: [id number]  Referring Provider: Marinda Elk, MD  DOB: [birth year]  PCP: Marinda Elk, MD  DOS: 05/16/2018  Note by: Gillis Santa, MD  Service setting: Ambulatory outpatient  Specialty: Interventional Pain Management  Location: [facility] (AMB) Pain Management Facility  Visit type: Initial Patient Evaluation  Patient type: New Patient   Primary Reason(s) for Visit: Encounter for initial evaluation of one or more chronic problems (new to examiner) potentially causing chronic pain, and posing a threat to normal musculoskeletal function. (Level of risk: High) CC: New Patient (Initial Visit)  HPI  Ms. Pendry is a 62 y.o. year old, female patient, who comes today to see [place] for the first time for an initial evaluation of her chronic pain. She has Smoking; Chronic pain disorder; Obesity; Hyperglycemia; COPD (chronic obstructive pulmonary disease) ([place]); Metabolic encephalopathy; Insomnia; Generalized anxiety disorder; Depression; Fibromyalgia; COPD with acute exacerbation (HCC); SIRS (systemic inflammatory response syndrome) ([place]); CAP (community acquired pneumonia); Sepsis ([place]); Acute respiratory failure with hypoxia and hypercapnia ([place]); Chronic obstructive pulmonary disease with acute lower respiratory infection ([place]); Total knee replacement status; COPD exacerbation ([place]); Elevated brain natriuretic peptide (BNP) level; LFT elevation; Respiratory failure ([place]); Palliative care by specialist; and Acute adjustment disorder with anxiety on their problem list. Today she comes in for evaluation of her New Patient (Initial Visit)  Pain Assessment: Location: Lower, Mid, Upper, Right, Left Back Radiating: From lower back at times radiates to hip area bilateral  Onset: More than a month ago Duration: Chronic pain Quality: Aching, Constant, Sharp, Shooting, Tender, Stabbing, Pressure Severity: 7 /10 (subjective, self-reported pain score)  Note: Reported level is  inconsistent with clinical observations. Clinically the patient looks like a 2/10 A 2/10 is viewed as "Mild to Moderate" and described as noticeable and distracting. Impossible to hide from other people. More frequent flare-ups. Still possible to adapt and function close to normal. It can be very annoying and may have occasional stronger flare-ups. With discipline, patients may get used to it and adapt. Information on the proper use of the pain scale provided to the patient today. When using our objective Pain Scale, levels between 6 and 10/10 are said to belong in an emergency room, as it progressively worsens from a 6/10, described as severely limiting, requiring emergency care not usually available at an outpatient pain management facility. At a 6/10 level, communication becomes difficult and requires great effort. Assistance to reach the emergency department may be required. Facial flushing and profuse sweating along with potentially dangerous increases in heart rate and blood pressure will be evident. Effect on ADL: "Hard to do anything. Unable to stand in one place"  Timing: Constant Modifying factors: Hot packs, resting, lying down BP: 124/72  HR: 66  Onset and Duration: Date of onset: 2012 and Present longer than 3 months Cause of pain: Unknown Severity: Getting worse, NAS-11 at its worse: 10/10, NAS-11 at its best: 2/10, NAS-11 now: 8/10 and NAS-11 on the average: 5/10 Timing: Not influenced by the time of the day, During activity or exercise and After activity or exercise Aggravating Factors: Bending, Climbing, Lifiting, Motion, Nerve blocks, Prolonged standing, Twisting, Walking uphill and Working Alleviating Factors: Hot packs, Lying down, Medications, Resting, Sitting, Sleeping, TENS and Walking Associated Problems: Fatigue, Nausea, Numbness, Temperature changes, Tingling, Pain that wakes patient up and Pain that does not allow patient to sleep Quality of Pain: Aching, Agonizing, Annoying,  Constant, Deep, Disabling, Feeling of constriction, Horrible, Pressure-like, Sharp, Shooting, Sickening, Stabbing, Tender,  Throbbing and Tingling Previous Examinations or Tests: CT scan, MRI scan, Nerve block and X-rays Previous Treatments: Narcotic medications and TENS  The patient comes into the clinics today for the first time for a chronic pain management evaluation.   62 year old female who presents with a chief complaint of diffuse whole body pain secondary to fibromyalgia along with cervical degenerative disc disease as well as axial low back pain.  Patient was previously seen at a pain clinic was discharged due to positive Valium on her urine drug screen.  She has had injections in the past which she does not remember.  She does have a history of anxiety and depression, she is on Zoloft 50 mg daily.  Patient is on disability for fibromyalgia.  She has limited income.  She is on BuSpar for anxiety.  She is not on chronic opioid therapy.  She states that she did utilize a Suboxone from a friend approximately 2 days ago which helped.  Today I took the time to provide the patient with information regarding my pain practice. The patient was informed that my practice is divided into two sections: an interventional pain management section, as well as a completely separate and distinct medication management section. I explained that I have procedure days for my interventional therapies, and evaluation days for follow-ups and medication management. Because of the amount of documentation required during both, they are kept separated. This means that there is the possibility that she may be scheduled for a procedure on one day, and medication management the next. I have also informed her that because of staffing and facility limitations, I no longer take patients for medication management only. To illustrate the reasons for this, I gave the patient the example of surgeons, and how inappropriate it would be to  refer a patient to his/her care, just to write for the post-surgical antibiotics on a surgery done by a different surgeon.   Because interventional pain management is my board-certified specialty, the patient was informed that joining my practice means that they are open to any and all interventional therapies. I made it clear that this does not mean that they will be forced to have any procedures done. What this means is that I believe interventional therapies to be essential part of the diagnosis and proper management of chronic pain conditions. Therefore, patients not interested in these interventional alternatives will be better served under the care of a different practitioner.  The patient was also made aware of my Comprehensive Pain Management Safety Guidelines where by joining my practice, they limit all of their nerve blocks and joint injections to those done by our practice, for as long as we are retained to manage their care.   Historic Controlled Substance Pharmacotherapy Review  PMP and historical list of controlled substances: Tramadol 50 mg, quantity 40, last fill 09/21/2016. Medications: The patient did not bring the medication(s) to the appointment, as requested in our "New Patient Package" Pharmacodynamics: Desired effects: Analgesia: The patient reports >50% benefit. Reported improvement in function: The patient reports medication allows her to accomplish basic ADLs. Clinically meaningful improvement in function (CMIF): Sustained CMIF goals met Perceived effectiveness: Described as relatively effective, allowing for increase in activities of daily living (ADL) Undesirable effects: Side-effects or Adverse reactions: None reported Historical Monitoring: The patient  reports that she does not use drugs. List of all UDS Test(s): Lab Results  Component Value Date   COCAINSCRNUR NONE DETECTED 07/24/2014   THCU NONE DETECTED 07/24/2014   ETH <11  07/24/2014   List of other  Serum/Urine Drug Screening Test(s):  Lab Results  Component Value Date   COCAINSCRNUR NONE DETECTED 07/24/2014   THCU NONE DETECTED 07/24/2014   ETH <11 07/24/2014   Historical Background Evaluation: [place] PMP: Six (6) year initial data search conducted.             [place] Department of public safety, offender search: Editor, commissioning Information) Non-contributory Risk Assessment Profile: Aberrant behavior: None observed or detected today Risk factors for fatal opioid overdose: None identified today Fatal overdose hazard ratio (HR): Calculation deferred Non-fatal overdose hazard ratio (HR): Calculation deferred Risk of opioid abuse or dependence: 0.7-3.0% with doses ? 36 MME/day and 6.1-26% with doses ? 120 MME/day. Substance use disorder (SUD) risk level: See below Personal History of Substance Abuse (SUD-Substance use disorder):  Alcohol: Negative  Illegal Drugs: Negative  Rx Drugs: Negative  ORT Risk Level calculation: Low Risk Opioid Risk Tool - 05/16/18 1039      Family History of Substance Abuse   Alcohol  Negative    Illegal Drugs  Negative    Rx Drugs  Negative      Personal History of Substance Abuse   Alcohol  Negative    Illegal Drugs  Negative    Rx Drugs  Negative      Age   Age between 45-45 years   No      History of Preadolescent Sexual Abuse   History of Preadolescent Sexual Abuse  Negative or Female      Psychological Disease   Psychological Disease  Negative    Depression  Positive      Total Score   Opioid Risk Tool Scoring  1    Opioid Risk Interpretation  Low Risk      ORT Scoring interpretation table:  Score <3 = Low Risk for SUD  Score between 4-7 = Moderate Risk for SUD  Score >8 = High Risk for Opioid Abuse   PHQ-2 Depression Scale:  Total score: 0  PHQ-2 Scoring interpretation table: (Score and probability of major depressive disorder)  Score 0 = No depression  Score 1 = 15.4% Probability  Score 2 = 21.1% Probability  Score 3 = 38.4% Probability   Score 4 = 45.5% Probability  Score 5 = 56.4% Probability  Score 6 = 78.6% Probability   PHQ-9 Depression Scale:  Total score: 0  PHQ-9 Scoring interpretation table:  Score 0-4 = No depression  Score 5-9 = Mild depression  Score 10-14 = Moderate depression  Score 15-19 = Moderately severe depression  Score 20-27 = Severe depression (2.4 times higher risk of SUD and 2.89 times higher risk of overuse)   Pharmacologic Plan: As per protocol, I have not taken over any controlled substance management, pending the results of ordered tests and/or consults.            Initial impression: Pending review of available data and ordered tests.  Meds   Current Outpatient Medications:  .  albuterol (PROVENTIL) (2.5 MG/3ML) 0.083% nebulizer solution, TAKE 3 ML BY NEBULIZATION Q 6 hrs prn FOR WHEEZING OR SOB Dx J44.9, Disp: 75 mL, Rfl: 2 .  atorvastatin (LIPITOR) 20 MG tablet, TAKE 1 TABLET (20 MG TOTAL) BY MOUTH DAILY., Disp: 90 tablet, Rfl: 1 .  busPIRone (BUSPAR) 5 MG tablet, Take 5 mg by mouth 3 (three) times daily., Disp: , Rfl:  .  sertraline (ZOLOFT) 50 MG tablet, Take 1 tablet (50 mg total) by mouth daily., Disp: 30 tablet, Rfl:  0 .  traZODone (DESYREL) 100 MG tablet, Take 1 tablet (100 mg total) by mouth at bedtime., Disp: 15 tablet, Rfl: 0 .  tiZANidine (ZANAFLEX) 4 MG tablet, Take 1 tablet (4 mg total) by mouth 2 (two) times daily as needed for muscle spasms., Disp: 90 tablet, Rfl: 1  Imaging Review   Shoulder-R DG:  Results for orders placed during the hospital encounter of 10/17/14  DG Shoulder Right   Narrative CLINICAL DATA:  Recent fall during seizure with right shoulder pain, initial encounter  EXAM: RIGHT SHOULDER - 2+ VIEW  COMPARISON:  None.  FINDINGS: Mild degenerative changes of the acromioclavicular joint are seen. No acute fracture or dislocation is noted.  IMPRESSION: No acute abnormality noted.   Electronically Signed   By: Inez Catalina M.D.   On: 10/17/2014  15:01      Lumbar DG (Complete) 4+V:  Results for orders placed during the hospital encounter of 10/17/14  DG Lumbar Spine Complete   Narrative CLINICAL DATA:  Recent injury during seizure with low back pain, initial encounter  EXAM: LUMBAR SPINE - COMPLETE 4+ VIEW  COMPARISON:  None.  FINDINGS: Five lumbar type vertebral bodies are well visualized. Vertebral body height is well maintained. Multilevel osteophytic changes are seen. Mild disc space narrowing is noted at L4-5. Mild aortic calcifications are noted without aneurysmal dilatation.  IMPRESSION: Degenerative change without acute abnormality.   Electronically Signed   By: Inez Catalina M.D.   On: 10/17/2014 15:04     Knee-L DG 4 views:  Results for orders placed during the hospital encounter of 10/17/14  DG Knee Complete 4 Views Left   Narrative CLINICAL DATA:  Recent injury during seizure with left knee pain, initial encounter  EXAM: LEFT KNEE - COMPLETE 4+ VIEW  COMPARISON:  03/27/2012  FINDINGS: Small joint effusion is noted. Tricompartmental degenerative changes are seen. These have progressed somewhat in the interval from the prior exam. No acute fracture or dislocation is noted.  IMPRESSION: Increasing degenerative change with small effusion. No acute bony abnormality is noted.   Electronically Signed   By: Inez Catalina M.D.   On: 10/17/2014 15:03    Complexity Note: Imaging results reviewed. Results shared with Ms. Mesa, using Layman's terms.                         ROS  Cardiovascular: No reported cardiovascular signs or symptoms such as High blood pressure, coronary artery disease, abnormal heart rate or rhythm, heart attack, blood thinner therapy or heart weakness and/or failure Pulmonary or Respiratory: Wheezing and difficulty taking a deep full breath (Asthma), Shortness of breath, Smoking and Coughing up mucus (Bronchitis) Neurological: Stroke (Residual deficits or weakness: unknown  ) Review of Past Neurological Studies:  Results for orders placed or performed during the hospital encounter of 10/17/14  CT Head Wo Contrast   Narrative   CLINICAL DATA:  Pt had a seizure at 0200 and fell. No hx of seizures. Friend witnessed seizure per pt. Pain to lower back and right arm. Headache which is chronic, per pt. Stats ears feel like the ocean is going through the ears x 1 week. Chronic left knee pain also. NAD. Pt requesting pain medication on arrival. Pain to right shoulder and right upper arm, hurts worse with movement. Pt states she feels a little SOB. Albuterol tx x 1 given by EMS. Pt has not been using home O2 since she quit smoking 09/26/14.  EXAM: CT HEAD  WITHOUT CONTRAST  TECHNIQUE: Contiguous axial images were obtained from the base of the skull through the vertex without intravenous contrast.  COMPARISON:  07/24/2014  FINDINGS: Ventricles normal in size and configuration.  There are no parenchymal masses or mass effect. No evidence of an infarct. There are no extra-axial masses or abnormal fluid collections.  There is no intracranial hemorrhage.  Visualized sinuses and mastoid air cells are clear. No skull lesion.  IMPRESSION: Normal unenhanced CT scan of the brain.   Electronically Signed   By: Lajean Manes M.D.   On: 10/17/2014 15:09    Psychological-Psychiatric: Anxiousness, Prone to panicking and Difficulty sleeping and or falling asleep Gastrointestinal: No reported gastrointestinal signs or symptoms such as vomiting or evacuating blood, reflux, heartburn, alternating episodes of diarrhea and constipation, inflamed or scarred liver, or pancreas or irrregular and/or infrequent bowel movements Genitourinary: No reported renal or genitourinary signs or symptoms such as difficulty voiding or producing urine, peeing blood, non-functioning kidney, kidney stones, difficulty emptying the bladder, difficulty controlling the flow of urine, or chronic  kidney disease Hematological: Brusing easily Endocrine: No reported endocrine signs or symptoms such as high or low blood sugar, rapid heart rate due to high thyroid levels, obesity or weight gain due to slow thyroid or thyroid disease Rheumatologic: Generalized muscle aches (Fibromyalgia) Musculoskeletal: Negative for myasthenia gravis, muscular dystrophy, multiple sclerosis or malignant hyperthermia Work History: Disabled  Allergies  Ms. Turcott is allergic to gabapentin; mobic [meloxicam]; penicillins; and sulfa antibiotics.  Laboratory Chemistry  Inflammation Markers (CRP: Acute Phase) (ESR: Chronic Phase) Lab Results  Component Value Date   LATICACIDVEN 0.8 09/30/2017                         Rheumatology Markers No results found for: RF, ANA, LABURIC, URICUR, LYMEIGGIGMAB, LYMEABIGMQN, HLAB27                      Renal Function Markers Lab Results  Component Value Date   BUN 15 10/02/2017   CREATININE 0.80 10/06/2017   BCR 16 08/01/2015   GFRAA >60 10/06/2017   GFRNONAA >60 10/06/2017                             Hepatic Function Markers Lab Results  Component Value Date   AST 58 (H) 10/01/2017   ALT 61 (H) 10/01/2017   ALBUMIN 3.4 (L) 10/01/2017   ALKPHOS 110 10/01/2017   AMYLASE 39 10/01/2017   LIPASE 18 10/01/2017                        Electrolytes Lab Results  Component Value Date   NA 139 10/02/2017   K 4.8 10/02/2017   CL 103 10/02/2017   CALCIUM 8.7 (L) 10/02/2017   MG 2.1 10/01/2017   PHOS 5.1 (H) 09/04/2015                        Neuropathy Markers Lab Results  Component Value Date   HGBA1C 6.0 (H) 05/19/2011   HIV Non Reactive 10/01/2017                        CNS Tests No results found for: COLORCSF, APPEARCSF, RBCCOUNTCSF, WBCCSF, POLYSCSF, LYMPHSCSF, EOSCSF, PROTEINCSF, GLUCCSF, JCVIRUS, CSFOLI, IGGCSF  Bone Pathology Markers No results found for: VD25OH, [id number], EH6314HF0, YO3785YI5, 25OHVITD1, 25OHVITD2,  25OHVITD3, TESTOFREE, TESTOSTERONE                       Coagulation Parameters Lab Results  Component Value Date   INR 1.01 09/01/2016   LABPROT 13.3 09/01/2016   APTT 28 09/01/2016   PLT 109 (L) 10/06/2017   DDIMER (H) 03/10/2009    0.94        AT THE INHOUSE ESTABLISHED CUTOFF VALUE OF 0.48 ug/mL FEU, THIS ASSAY HAS BEEN DOCUMENTED IN THE LITERATURE TO HAVE A SENSITIVITY AND NEGATIVE PREDICTIVE VALUE OF AT LEAST 98 TO 99%.  THE TEST RESULT SHOULD BE CORRELATED WITH AN ASSESSMENT OF THE CLINICAL PROBABILITY OF DVT / VTE.                        Cardiovascular Markers Lab Results  Component Value Date   BNP 72.0 10/03/2017   CKTOTAL 28 (L) 09/01/2015   CKMB 3.4 07/26/2010   TROPONINI <0.03 10/01/2017   HGB 15.1 10/06/2017   HCT 46.7 10/06/2017                         CA Markers No results found for: CEA, CA125, LABCA2                      Note: Lab results reviewed.  [place]  Drug: Ms. Donald  reports that she does not use drugs. Alcohol:  reports that she drinks alcohol. Tobacco:  reports that she has been smoking cigarettes. She has a 45.00 pack-year smoking history. She has never used smokeless tobacco. Medical:  has a past medical history of Anxiety, Asthma, Chronic pain, COPD (chronic obstructive pulmonary disease) ([place]), Depression, DJD (degenerative joint disease), Fibromyalgia, GERD (gastroesophageal reflux disease), Headache, Hyperlipidemia, Lower extremity edema, On home oxygen therapy, Panic attack, Shortness of breath dyspnea, and Sleep disorder. Family: family history includes Cancer in her mother.  Past Surgical History:  Procedure Laterality Date  . ABDOMINAL SURGERY    . ANKLE SURGERY    . BACK SURGERY     lower  . CARPAL TUNNEL RELEASE    . CESAREAN SECTION    . KNEE SURGERY    . TONSILLECTOMY    . TOTAL KNEE ARTHROPLASTY Left 04/14/2016   Procedure: TOTAL KNEE ARTHROPLASTY;  Surgeon: Earnestine Leys, MD;  Location: [facility] ORS;  Service: Orthopedics;   Laterality: Left;  . TUBAL LIGATION     Active Ambulatory Problems    Diagnosis Date Noted  . Smoking 05/19/2011  . Chronic pain disorder 05/19/2011  . Obesity 05/19/2011  . Hyperglycemia 05/19/2011  . COPD (chronic obstructive pulmonary disease) ([place]) 06/01/2011  . Metabolic encephalopathy 02/77/4128  . Insomnia 11/04/2012  . Generalized anxiety disorder 11/04/2012  . Depression 11/04/2012  . Fibromyalgia 11/04/2012  . COPD with acute exacerbation ([place]) 09/29/2013  . SIRS (systemic inflammatory response syndrome) ([place]) 07/24/2014  . CAP (community acquired pneumonia) 09/01/2015  . Sepsis ([place]) 09/03/2015  . Acute respiratory failure with hypoxia and hypercapnia (HCC)   . Chronic obstructive pulmonary disease with acute lower respiratory infection ([place])   . 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 COPD exacerbation ([place]) 09/01/2016  . Elevated brain natriuretic peptide (BNP) level 09/01/2016  . LFT elevation 09/01/2016  . Respiratory failure ([place]) 10/01/2017  . Palliative care by specialist   . Acute adjustment disorder with anxiety  10/06/2017   Resolved Ambulatory Problems    Diagnosis Date Noted  . Acute respiratory failure with hypercapnia ([place]) 05/20/2011  . Acute-on-chronic respiratory failure ([place]) 11/27/2012  . CAP (community acquired pneumonia) 11/29/2012  . Pneumonia due to Escherichia coli ([place]) 12/03/2012  . Acute exacerbation of chronic obstructive pulmonary disease (COPD) ([place]) 09/29/2013  . Acute respiratory failure ([place]) 09/29/2013  . Acute respiratory acidosis 10/02/2013  . Respiratory failure requiring intubation ([place]) 07/24/2014  . Respiratory failure, acute ([place]) 09/03/2015  . Acute respiratory failure [facility])    Past Medical History:  Diagnosis Date  . Anxiety   . Asthma   . Chronic pain   . DJD (degenerative joint disease)   . GERD (gastroesophageal reflux disease)   . Headache   . Hyperlipidemia   . Lower extremity edema   . On home oxygen  therapy   . Panic attack   . Shortness of breath dyspnea   . Sleep disorder    Constitutional Exam  General appearance: Well nourished, well developed, and well hydrated. In no apparent acute distress Vitals:   05/16/18 1030  BP: 124/72  Pulse: 66  Resp: 18  Temp: 98.1 F (36.7 C)  SpO2: 95%  Weight: 180 lb (81.6 kg)  Height: '5\' 3"'  (1.6 m)   BMI Assessment: Estimated body mass index is 31.89 kg/m as calculated from the following:   Height as of this encounter: '5\' 3"'  (1.6 m).   Weight as of this encounter: 180 lb (81.6 kg).  BMI interpretation table: BMI level Category Range association with higher incidence of chronic pain  <18 kg/m2 Underweight   18.5-24.9 kg/m2 Ideal body weight   25-29.9 kg/m2 Overweight Increased incidence by 20%  30-34.9 kg/m2 Obese (Class I) Increased incidence by 68%  35-39.9 kg/m2 Severe obesity (Class II) Increased incidence by 136%  >40 kg/m2 Extreme obesity (Class III) Increased incidence by 254%   Patient's current BMI Ideal Body weight  Body mass index is 31.89 kg/m. Ideal body weight: 52.4 kg (115 lb 8.3 oz) Adjusted ideal body weight: 64.1 kg (141 lb 5 oz)   BMI Readings from Last 4 Encounters:  05/16/18 31.89 kg/m  10/04/17 34.62 kg/m  09/03/16 34.64 kg/m  04/14/16 33.13 kg/m   Wt Readings from Last 4 Encounters:  05/16/18 180 lb (81.6 kg)  10/04/17 189 lb 4.8 oz (85.9 kg)  09/03/16 195 lb 8.8 oz (88.7 kg)  04/14/16 187 lb (84.8 kg)  Psych/Mental status: Alert, oriented x 3 (person, place, & time)       Eyes: PERLA Respiratory: No evidence of acute respiratory distress  Cervical Spine Area Exam  Skin & Axial Inspection: No masses, redness, edema, swelling, or associated skin lesions Alignment: Symmetrical Functional ROM: Unrestricted ROM      Stability: No instability detected Muscle Tone/Strength: Functionally intact. No obvious neuro-muscular anomalies detected. Sensory (Neurological): Musculoskeletal pain  pattern Palpation: No palpable anomalies              Upper Extremity (UE) Exam    Side: Right upper extremity  Side: Left upper extremity  Skin & Extremity Inspection: Skin color, temperature, and hair growth are WNL. No peripheral edema or cyanosis. No masses, redness, swelling, asymmetry, or associated skin lesions. No contractures.  Skin & Extremity Inspection: Skin color, temperature, and hair growth are WNL. No peripheral edema or cyanosis. No masses, redness, swelling, asymmetry, or associated skin lesions. No contractures.  Functional ROM: Unrestricted ROM          Functional ROM:  Unrestricted ROM          Muscle Tone/Strength: Functionally intact. No obvious neuro-muscular anomalies detected.  Muscle Tone/Strength: Functionally intact. No obvious neuro-muscular anomalies detected.  Sensory (Neurological): Unimpaired          Sensory (Neurological): Unimpaired          Palpation: No palpable anomalies              Palpation: No palpable anomalies              Provocative Test(s):  Phalen's test: deferred Tinel's test: deferred Apley's scratch test (touch opposite shoulder):  Action 1 (Across chest): deferred Action 2 (Overhead): deferred Action 3 (LB reach): deferred   Provocative Test(s):  Phalen's test: deferred Tinel's test: deferred Apley's scratch test (touch opposite shoulder):  Action 1 (Across chest): deferred Action 2 (Overhead): deferred Action 3 (LB reach): deferred    Thoracic Spine Area Exam  Skin & Axial Inspection: No masses, redness, or swelling Alignment: Symmetrical Functional ROM: Unrestricted ROM Stability: No instability detected Muscle Tone/Strength: Functionally intact. No obvious neuro-muscular anomalies detected. Sensory (Neurological): Musculoskeletal pain pattern Muscle strength & Tone: No palpable anomalies  Lumbar Spine Area Exam  Skin & Axial Inspection: No masses, redness, or swelling Alignment: Symmetrical Functional ROM: Decreased ROM  affecting both sides Stability: No instability detected Muscle Tone/Strength: Functionally intact. No obvious neuro-muscular anomalies detected. Sensory (Neurological): Musculoskeletal pain pattern Palpation: No palpable anomalies       Provocative Tests: Hyperextension/rotation test: deferred today       Lumbar quadrant test (Kemp's test): deferred today       Lateral bending test: deferred today       Patrick's Maneuver: deferred today                   FABER test: deferred today                   S-I anterior distraction/compression test: deferred today         S-I lateral compression test: deferred today         S-I Thigh-thrust test: deferred today         S-I Gaenslen's test: deferred today          Gait & Posture Assessment  Ambulation: Unassisted Gait: Relatively normal for age and body habitus Posture: WNL   Lower Extremity Exam    Side: Right lower extremity  Side: Left lower extremity  Stability: No instability observed          Stability: No instability observed          Skin & Extremity Inspection: Skin color, temperature, and hair growth are WNL. No peripheral edema or cyanosis. No masses, redness, swelling, asymmetry, or associated skin lesions. No contractures.  Skin & Extremity Inspection: Skin color, temperature, and hair growth are WNL. No peripheral edema or cyanosis. No masses, redness, swelling, asymmetry, or associated skin lesions. No contractures.  Functional ROM: Unrestricted ROM                  Functional ROM: Unrestricted ROM                  Muscle Tone/Strength: Functionally intact. No obvious neuro-muscular anomalies detected.  Muscle Tone/Strength: Functionally intact. No obvious neuro-muscular anomalies detected.  Sensory (Neurological): Unimpaired  Sensory (Neurological): Unimpaired  Palpation: No palpable anomalies  Palpation: No palpable anomalies   Assessment  Primary Diagnosis & Pertinent Problem List: The primary encounter diagnosis was  Fibromyalgia. Diagnoses of Generalized anxiety disorder, Chronic pain disorder, Chronic bilateral thoracic back pain, Cervicalgia, Right hip pain, Bilateral primary osteoarthritis of knee, Chronic knee pain after total replacement of left knee joint, and Chronic pain syndrome were also pertinent to this visit.  Visit Diagnosis (New problems to examiner): 1. Fibromyalgia   2. Generalized anxiety disorder   3. Chronic pain disorder   4. Chronic bilateral thoracic back pain   5. Cervicalgia   6. Right hip pain   7. Bilateral primary osteoarthritis of knee   8. Chronic knee pain after total replacement of left knee joint   9. Chronic pain syndrome    Plan of Care (Initial workup plan)  Note: Please be advised that as per protocol, today's visit has been an evaluation only. We have not taken over the patient's controlled substance management.  Problem-specific plan: No problem-specific Assessment & Plan notes found for this encounter.  Ordered Lab-work, Procedure(s), Referral(s), & Consult(s): Orders Placed This Encounter  Procedures  . DG Cervical Spine With Flex & Extend  . DG Thoracic Spine 2 View  . DG Lumbar Spine Complete W/Bend  . DG Si Joints  . DG Knee 1-2 Views Left  . DG Knee 1-2 Views Right  . Compliance Drug Analysis, Ur   Pharmacotherapy (current): Medications ordered:  Meds ordered this encounter  Medications  . tiZANidine (ZANAFLEX) 4 MG tablet    Sig: Take 1 tablet (4 mg total) by mouth 2 (two) times daily as needed for muscle spasms.    Dispense:  90 tablet    Refill:  1   Medications administered during this visit: Loma Boston had no medications administered during this visit.   Pharmacological management options:  Opioid Analgesics: The patient was informed that there is no guarantee that she would be a candidate for opioid analgesics. The decision will be made following CDC guidelines. This decision will be based on the results of diagnostic studies, as  well as Ms. Jaffee's risk profile.  Will only be a candidate for buprenorphine: Belbuca or Butrans.  Membrane stabilizer: To be determined at a later time  Muscle relaxant: To be determined at a later time  NSAID: To be determined at a later time  Other analgesic(s): To be determined at a later time   Interventional management options: Ms. Govan was informed that there is no guarantee that she would be a candidate for interventional therapies. The decision will be based on the results of diagnostic studies, as well as Ms. Aufiero's risk profile.  Procedure(s) under consideration:  Cervical facet medial branch nerve blocks Lumbar facet medial branch nerve blocks Thoracic facet medial branch nerve blocks   Provider-requested follow-up: Return in about 5 weeks (around 06/20/2018) for Medication Management.  Future Appointments  Date Time Provider [place]  06/22/2018 11:00 AM Gillis Santa, MD [facility] None    Primary Care Physician: Marinda Elk, MD Location: [facility] Note by: Gillis Santa, M.D, Date: 05/16/2018; Time: 1:21 PM  Patient Instructions  Pick up prescription for Zanaflex at your pharmacy and follow up with x-rays.

## 2018-05-16 NOTE — Patient Instructions (Signed)
Pick up prescription for Zanaflex at your pharmacy and follow up with x-rays.

## 2018-05-16 NOTE — Progress Notes (Signed)
Safety precautions to be maintained throughout the outpatient stay will include: orient to surroundings, keep bed in low position, maintain call bell within reach at all times, provide assistance with transfer out of bed and ambulation.  

## 2018-05-20 LAB — COMPLIANCE DRUG ANALYSIS, UR

## 2018-06-22 ENCOUNTER — Telehealth: Payer: Self-pay | Admitting: Student in an Organized Health Care Education/Training Program

## 2018-06-22 ENCOUNTER — Ambulatory Visit
Payer: Medicare Other | Attending: Student in an Organized Health Care Education/Training Program | Admitting: Student in an Organized Health Care Education/Training Program

## 2018-06-22 ENCOUNTER — Other Ambulatory Visit: Payer: Self-pay

## 2018-06-22 ENCOUNTER — Encounter: Payer: Self-pay | Admitting: Student in an Organized Health Care Education/Training Program

## 2018-06-22 VITALS — BP 140/92 | HR 89 | Temp 97.9°F | Resp 18 | Ht 63.0 in | Wt 180.0 lb

## 2018-06-22 DIAGNOSIS — E669 Obesity, unspecified: Secondary | ICD-10-CM | POA: Diagnosis not present

## 2018-06-22 DIAGNOSIS — Z882 Allergy status to sulfonamides status: Secondary | ICD-10-CM | POA: Insufficient documentation

## 2018-06-22 DIAGNOSIS — M25551 Pain in right hip: Secondary | ICD-10-CM | POA: Diagnosis not present

## 2018-06-22 DIAGNOSIS — K219 Gastro-esophageal reflux disease without esophagitis: Secondary | ICD-10-CM | POA: Diagnosis not present

## 2018-06-22 DIAGNOSIS — J449 Chronic obstructive pulmonary disease, unspecified: Secondary | ICD-10-CM | POA: Insufficient documentation

## 2018-06-22 DIAGNOSIS — G894 Chronic pain syndrome: Secondary | ICD-10-CM | POA: Insufficient documentation

## 2018-06-22 DIAGNOSIS — M546 Pain in thoracic spine: Secondary | ICD-10-CM | POA: Insufficient documentation

## 2018-06-22 DIAGNOSIS — Z79899 Other long term (current) drug therapy: Secondary | ICD-10-CM | POA: Insufficient documentation

## 2018-06-22 DIAGNOSIS — G4700 Insomnia, unspecified: Secondary | ICD-10-CM | POA: Insufficient documentation

## 2018-06-22 DIAGNOSIS — M170 Bilateral primary osteoarthritis of knee: Secondary | ICD-10-CM | POA: Diagnosis not present

## 2018-06-22 DIAGNOSIS — F411 Generalized anxiety disorder: Secondary | ICD-10-CM | POA: Insufficient documentation

## 2018-06-22 DIAGNOSIS — Z9981 Dependence on supplemental oxygen: Secondary | ICD-10-CM | POA: Insufficient documentation

## 2018-06-22 DIAGNOSIS — F329 Major depressive disorder, single episode, unspecified: Secondary | ICD-10-CM | POA: Insufficient documentation

## 2018-06-22 DIAGNOSIS — Z96653 Presence of artificial knee joint, bilateral: Secondary | ICD-10-CM | POA: Insufficient documentation

## 2018-06-22 DIAGNOSIS — M797 Fibromyalgia: Secondary | ICD-10-CM | POA: Diagnosis not present

## 2018-06-22 DIAGNOSIS — Z5181 Encounter for therapeutic drug level monitoring: Secondary | ICD-10-CM | POA: Insufficient documentation

## 2018-06-22 DIAGNOSIS — R51 Headache: Secondary | ICD-10-CM | POA: Diagnosis not present

## 2018-06-22 DIAGNOSIS — Z6831 Body mass index (BMI) 31.0-31.9, adult: Secondary | ICD-10-CM | POA: Diagnosis not present

## 2018-06-22 DIAGNOSIS — G8929 Other chronic pain: Secondary | ICD-10-CM

## 2018-06-22 DIAGNOSIS — M542 Cervicalgia: Secondary | ICD-10-CM

## 2018-06-22 DIAGNOSIS — R7989 Other specified abnormal findings of blood chemistry: Secondary | ICD-10-CM | POA: Insufficient documentation

## 2018-06-22 DIAGNOSIS — F17210 Nicotine dependence, cigarettes, uncomplicated: Secondary | ICD-10-CM | POA: Diagnosis not present

## 2018-06-22 DIAGNOSIS — Z880 Allergy status to penicillin: Secondary | ICD-10-CM | POA: Diagnosis not present

## 2018-06-22 DIAGNOSIS — M533 Sacrococcygeal disorders, not elsewhere classified: Secondary | ICD-10-CM | POA: Insufficient documentation

## 2018-06-22 DIAGNOSIS — G9341 Metabolic encephalopathy: Secondary | ICD-10-CM | POA: Diagnosis not present

## 2018-06-22 DIAGNOSIS — Z888 Allergy status to other drugs, medicaments and biological substances status: Secondary | ICD-10-CM | POA: Insufficient documentation

## 2018-06-22 DIAGNOSIS — R600 Localized edema: Secondary | ICD-10-CM | POA: Diagnosis not present

## 2018-06-22 DIAGNOSIS — M50322 Other cervical disc degeneration at C5-C6 level: Secondary | ICD-10-CM | POA: Diagnosis not present

## 2018-06-22 DIAGNOSIS — E785 Hyperlipidemia, unspecified: Secondary | ICD-10-CM | POA: Diagnosis not present

## 2018-06-22 DIAGNOSIS — M25562 Pain in left knee: Secondary | ICD-10-CM

## 2018-06-22 DIAGNOSIS — Z96652 Presence of left artificial knee joint: Secondary | ICD-10-CM

## 2018-06-22 MED ORDER — BUPRENORPHINE HCL 150 MCG BU FILM: 150.0000 ug | ORAL_FILM | Freq: Two times a day (BID) | BUCCAL | 1 refills | Status: DC

## 2018-06-22 NOTE — Telephone Encounter (Signed)
PA sent and info is in [place] notebook and also under medication refill.

## 2018-06-22 NOTE — Progress Notes (Signed)
Patient's Name: Caleyah E Sitter  MRN: [id number]  Referring Provider: No ref. provider found  DOB: 03/11/1956  PCP: McLaughlin, Miriam K, MD  DOS: 06/22/2018  Note by:  , MD  Service setting: Ambulatory outpatient  Specialty: Interventional Pain Management  Location: [facility] (AMB) Pain Management Facility    Patient type: Established   Primary Reason(s) for Visit: Encounter for prescription drug management. (Level of risk: moderate)  CC: Back Pain  HPI  Ms. Guagliardo is a 62 y.o. year old, female patient, who comes today for a medication management evaluation. She has Smoking; Chronic pain disorder; Obesity; Hyperglycemia; COPD (chronic obstructive pulmonary disease) (HCC); Metabolic encephalopathy; Insomnia; Generalized anxiety disorder; Depression; Fibromyalgia; COPD with acute exacerbation (HCC); SIRS (systemic inflammatory response syndrome) (HCC); CAP (community acquired pneumonia); Sepsis (HCC); Acute respiratory failure with hypoxia and hypercapnia (HCC); Chronic obstructive pulmonary disease with acute lower respiratory infection (HCC); Total knee replacement status; COPD exacerbation (HCC); Elevated brain natriuretic peptide (BNP) level; LFT elevation; Respiratory failure (HCC); Palliative care by specialist; and Acute adjustment disorder with anxiety on their problem list. Her primarily concern today is the Back Pain  Pain Assessment: Location: Upper, Mid, Lower, Right, Left Back Radiating: sometimes radiates to hips Onset: More than a month ago Duration: Chronic pain Quality: Aching, Constant Severity: 6 /10 (subjective, self-reported pain score)  Note: Reported level is inconsistent with clinical observations. Clinically the patient looks like a 3/10 A 3/10 is viewed as "Moderate" and described as significantly interfering with activities of daily living (ADL). It becomes difficult to feed, bathe, get dressed, get on and off the toilet or to perform personal hygiene functions.  Difficult to get in and out of bed or a chair without assistance. Very distracting. With effort, it can be ignored when deeply involved in activities. Information on the proper use of the pain scale provided to the patient today. When using our objective Pain Scale, levels between 6 and 10/10 are said to belong in an emergency room, as it progressively worsens from a 6/10, described as severely limiting, requiring emergency care not usually available at an outpatient pain management facility. At a 6/10 level, communication becomes difficult and requires great effort. Assistance to reach the emergency department may be required. Facial flushing and profuse sweating along with potentially dangerous increases in heart rate and blood pressure will be evident. Effect on ADL: limits daily activities Timing: Constant Modifying factors: hot packs, rest, lying down BP: (!) 140/92  HR: 89  Ms. Trapani was last scheduled for an appointment on 05/16/2018 for medication management. During today's appointment we reviewed Ms. Arbaugh's chronic pain status, as well as her outpatient medication regimen.  The patient  reports that she does not use drugs. Her body mass index is 31.89 kg/m.  Further details on both, my assessment(s), as well as the proposed treatment plan, please see below.  Controlled Substance Pharmacotherapy Assessment REMS (Risk Evaluation and Mitigation Strategy)  Analgesic: 0 MME/day: 0 mg/day.  Welborn, Susan, RN  06/22/2018 11:27 AM  Signed Safety precautions to be maintained throughout the outpatient stay will include: orient to surroundings, keep bed in low position, maintain call bell within reach at all times, provide assistance with transfer out of bed and ambulation.     Monitoring: [place] PMP: Online review of the past 12-month period conducted. Compliant with practice rules and regulations Last UDS on record: Summary  Date Value Ref Range Status  05/16/2018 FINAL  Final    Comment:     ==================================================================== TOXASSURE COMP   DRUG ANALYSIS,UR ==================================================================== Test                             Result       Flag       Units Drug Present and Declared for Prescription Verification   Sertraline                     PRESENT      EXPECTED   Desmethylsertraline            PRESENT      EXPECTED    Desmethylsertraline is an expected metabolite of sertraline.   Trazodone                      PRESENT      EXPECTED   1,3 chlorophenyl piperazine    PRESENT      EXPECTED    1,3-chlorophenyl piperazine is an expected metabolite of    trazodone. Drug Present not Declared for Prescription Verification   Buprenorphine                  11           UNEXPECTED ng/mg creat   Norbuprenorphine               45           UNEXPECTED ng/mg creat    Source of buprenorphine is a scheduled prescription medication.    Norbuprenorphine is an expected metabolite of buprenorphine.   Methocarbamol                  PRESENT      UNEXPECTED Drug Absent but Declared for Prescription Verification   Tizanidine                     Not Detected UNEXPECTED    Tizanidine, as indicated in the declared medication list, is not    always detected even when used as directed. ==================================================================== Test                      Result    Flag   Units      Ref Range   Creatinine              126              mg/dL      >=20 ==================================================================== Declared Medications:  The flagging and interpretation on this report are based on the  following declared medications.  Unexpected results may arise from  inaccuracies in the declared medications.  **Note: The testing scope of this panel includes these medications:  Sertraline (Zoloft)  Trazodone  **Note: The testing scope of this panel does not include small to  moderate amounts of these  reported medications:  Tizanidine  **Note: The testing scope of this panel does not include following  reported medications:  Albuterol (Proventil)  Atorvastatin (Lipitor)  Buspirone (BuSpar) ==================================================================== For clinical consultation, please call [phone]. ====================================================================    UDS interpretation: Compliant         Patient was honest and inform me that she did take Suboxone from a friend hence her UDS being positive for buprenorphine. Medication Assessment Form: Reviewed. Patient indicates being compliant with therapy Treatment compliance: N/A Risk Assessment Profile: Aberrant behavior: See prior evaluations. None observed or detected today did take a friend's Suboxone which I counseled her on.    Reinforce that patient should not take any medications that are not prescribed for her. Comorbid factors increasing risk of overdose: See prior notes. No additional risks detected today Opioid risk tool (ORT) (Total Score): 0 Personal History of Substance Abuse (SUD-Substance use disorder):  Alcohol: Negative  Illegal Drugs: Negative  Rx Drugs: Negative  ORT Risk Level calculation: Low Risk Risk of substance use disorder (SUD): Moderate-to-High Opioid Risk Tool - 06/22/18 1107      Family History of Substance Abuse   Alcohol  Negative    Illegal Drugs  Negative    Rx Drugs  Negative      Personal History of Substance Abuse   Alcohol  Negative    Illegal Drugs  Negative    Rx Drugs  Negative      Age   Age between 21-45 years   No      History of Preadolescent Sexual Abuse   History of Preadolescent Sexual Abuse  Negative or Female      Psychological Disease   Psychological Disease  Negative    Depression  Negative      Total Score   Opioid Risk Tool Scoring  0    Opioid Risk Interpretation  Low Risk      ORT Scoring interpretation table:  Score <3 = Low Risk for SUD   Score between 4-7 = Moderate Risk for SUD  Score >8 = High Risk for Opioid Abuse   Risk Mitigation Strategies:  Patient Counseling: Covered Patient-Prescriber Agreement (PPA): Obtained today  Notification to other healthcare providers: Written and sent today  Pharmacologic Plan: Today we will take over the chronic pain medication management and from this point on our medication agreement with this patient is active.             Laboratory Chemistry  Inflammation Markers (CRP: Acute Phase) (ESR: Chronic Phase) Lab Results  Component Value Date   LATICACIDVEN 0.8 09/30/2017                         Rheumatology Markers No results found for: RF, ANA, LABURIC, URICUR, LYMEIGGIGMAB, LYMEABIGMQN, HLAB27                      Renal Function Markers Lab Results  Component Value Date   BUN 15 10/02/2017   CREATININE 0.80 10/06/2017   BCR 16 08/01/2015   GFRAA >60 10/06/2017   GFRNONAA >60 10/06/2017                             Hepatic Function Markers Lab Results  Component Value Date   AST 58 (H) 10/01/2017   ALT 61 (H) 10/01/2017   ALBUMIN 3.4 (L) 10/01/2017   ALKPHOS 110 10/01/2017   AMYLASE 39 10/01/2017   LIPASE 18 10/01/2017                        Electrolytes Lab Results  Component Value Date   NA 139 10/02/2017   K 4.8 10/02/2017   CL 103 10/02/2017   CALCIUM 8.7 (L) 10/02/2017   MG 2.1 10/01/2017   PHOS 5.1 (H) 09/04/2015                        Neuropathy Markers Lab Results  Component Value Date   HGBA1C 6.0 (H) 05/19/2011   HIV Non Reactive 10/01/2017  CNS Tests No results found for: COLORCSF, APPEARCSF, RBCCOUNTCSF, WBCCSF, POLYSCSF, LYMPHSCSF, EOSCSF, PROTEINCSF, GLUCCSF, JCVIRUS, CSFOLI, IGGCSF                      Bone Pathology Markers No results found for: VD25OH, [id number], [id number], [id number], 25OHVITD1, 25OHVITD2, 25OHVITD3, TESTOFREE, TESTOSTERONE                       Coagulation Parameters Lab Results   Component Value Date   INR 1.01 09/01/2016   LABPROT 13.3 09/01/2016   APTT 28 09/01/2016   PLT 109 (L) 10/06/2017   DDIMER (H) 03/10/2009    0.94        AT THE INHOUSE ESTABLISHED CUTOFF VALUE OF 0.48 ug/mL FEU, THIS ASSAY HAS BEEN DOCUMENTED IN THE LITERATURE TO HAVE A SENSITIVITY AND NEGATIVE PREDICTIVE VALUE OF AT LEAST 98 TO 99%.  THE TEST RESULT SHOULD BE CORRELATED WITH AN ASSESSMENT OF THE CLINICAL PROBABILITY OF DVT / VTE.                        Cardiovascular Markers Lab Results  Component Value Date   BNP 72.0 10/03/2017   CKTOTAL 28 (L) 09/01/2015   CKMB 3.4 07/26/2010   TROPONINI <0.03 10/01/2017   HGB 15.1 10/06/2017   HCT 46.7 10/06/2017                         CA Markers No results found for: CEA, CA125, LABCA2                      Note: Lab results reviewed.  Recent Diagnostic Imaging Results  DG Si Joints CLINICAL DATA:  62 year old female with chronic lower back and sacroiliac joint pain.  EXAM: BILATERAL SACROILIAC JOINTS - 3+ VIEW  COMPARISON:  Lumbar spine films same date dictated separately.  FINDINGS: Sacroiliac joints appear intact. No significant hip joint space narrowing. Degenerative changes lower lumbar spine.  IMPRESSION: Sacroiliac joints appear intact.  Electronically Signed   By: Genia Del M.D.   On: 05/16/2018 17:45 DG Knee 1-2 Views Right CLINICAL DATA:  62 year old female with bilateral knee pain. No injury. Initial encounter.  EXAM: RIGHT KNEE - 1-2 VIEW  COMPARISON:  None.  FINDINGS: No fracture or dislocation. No significant tibiofemoral joint space narrowing. Minimal spurring posterior patella may represent minimal patellofemoral joint degenerative changes. No joint effusion.  IMPRESSION: Minimal spurring posterior patella may represent result of minimal patellofemoral joint degenerative changes. Otherwise negative.  Electronically Signed   By: Genia Del M.D.   On: 05/16/2018 17:43 DG Knee  1-2 Views Left CLINICAL DATA:  Pain.  EXAM: LEFT KNEE - 1-2 VIEW  COMPARISON:  04/14/2016  FINDINGS: Hardware components of a left knee arthroplasty device are again noted. These appear to be in anatomic alignment. No periprosthetic fracture or dislocation identified. No periprosthetic lucencies identified.  IMPRESSION: 1. No acute findings. 2. Status post left knee arthroplasty.  Electronically Signed   By: Kerby Moors M.D.   On: 05/16/2018 17:28 DG Lumbar Spine Complete W/Bend CLINICAL DATA:  Mid and low back pain for years, no acute injury  EXAM: LUMBAR SPINE - COMPLETE WITH BENDING VIEWS  COMPARISON:  Lumbar spine films of 10/17/2014  FINDINGS: The lumbar vertebrae are unchanged in alignment. There is mild degenerative disc disease diffusely with some loss of disc space and mild spurring. No acute compression deformity  is seen. There is degenerative change of the facet joints of L4-5 and L5-S1. Through flexion and extension there is relatively normal range of motion with no malalignment. Moderate abdominal aortic atherosclerosis is present.  IMPRESSION: 1. No change in alignment with mild degenerative disc disease diffusely. 2. Relatively normal range of motion with no malalignment.  Electronically Signed   By: Ivar Drape M.D.   On: 05/16/2018 17:25 DG Thoracic Spine 2 View CLINICAL DATA:  Upper and lower back pain, no injury  EXAM: THORACIC SPINE 2 VIEWS  COMPARISON:  Chest x-ray of 10/02/2017 and 09/03/2016  FINDINGS: The thoracic vertebrae remain unchanged in alignment. No compression deformity is seen. Intervertebral disc spaces appear normal. Degenerative change is noted in the mid to lower thoracic spine. No prominent paravertebral soft tissue is noted.  IMPRESSION: Mild degenerative change in the mid to lower thoracic spine. No acute abnormality.  Electronically Signed   By: Ivar Drape M.D.   On: 05/16/2018 17:24 DG Cervical Spine  With Flex & Extend CLINICAL DATA:  Neck and back pain for years, no trauma  EXAM: CERVICAL SPINE COMPLETE WITH FLEXION AND EXTENSION VIEWS  COMPARISON:  None.  FINDINGS: The cervical vertebrae are somewhat straightened in alignment. There is mild retrolisthesis of C5 on C6 of 2 mm most likely due to degenerative change of the facet joints. There is degenerative disc disease at C5-6 with loss of disc space and sclerosis. The remainder of intervertebral disc spaces appear normal. No prevertebral soft tissue swelling is noted. Only mild foraminal narrowing is present bilaterally at C5-6. The lung apices are clear and the odontoid process is intact.  Through flexion and extension there is relatively normal range of motion with no malalignment. No change in the slight retrolisthesis of C5 on C6 is seen.  IMPRESSION: 1. Straightened alignment with degenerative disc disease at C5-6. 2. Mild foraminal narrowing bilaterally at C5-6. 3. No change in slight retrolisthesis of C5 on C6 through flexion and extension with relatively normal alignment maintained.  Electronically Signed   By: Ivar Drape M.D.   On: 05/16/2018 17:23  Complexity Note: Imaging results reviewed. Results shared with Ms. Mulkern, using Layman's terms. Today I personally and independently reviewed the study images pertinent to Ms. Delgadillo's problem.            I have personally examined the images and I agree with the reported  findings. I find no additional pain-related pathology to add to the report.  Meds   Current Outpatient Medications:  .  albuterol (PROVENTIL) (2.5 MG/3ML) 0.083% nebulizer solution, TAKE 3 ML BY NEBULIZATION Q 6 hrs prn FOR WHEEZING OR SOB Dx J44.9, Disp: 75 mL, Rfl: 2 .  atorvastatin (LIPITOR) 20 MG tablet, TAKE 1 TABLET (20 MG TOTAL) BY MOUTH DAILY., Disp: 90 tablet, Rfl: 1 .  busPIRone (BUSPAR) 5 MG tablet, Take 5 mg by mouth 3 (three) times daily., Disp: , Rfl:  .  sertraline (ZOLOFT) 50 MG  tablet, Take 1 tablet (50 mg total) by mouth daily., Disp: 30 tablet, Rfl: 0 .  tiZANidine (ZANAFLEX) 4 MG tablet, Take 1 tablet (4 mg total) by mouth 2 (two) times daily as needed for muscle spasms., Disp: 90 tablet, Rfl: 1 .  traZODone (DESYREL) 100 MG tablet, Take 1 tablet (100 mg total) by mouth at bedtime., Disp: 15 tablet, Rfl: 0 .  Buprenorphine HCl (BELBUCA) 150 MCG FILM, Place 150 mcg inside cheek 2 (two) times daily., Disp: 60 each, Rfl: 1  ROS  Constitutional: Denies any fever or chills Gastrointestinal: No reported hemesis, hematochezia, vomiting, or acute GI distress Musculoskeletal: Denies any acute onset joint swelling, redness, loss of ROM, or weakness Neurological: No reported episodes of acute onset apraxia, aphasia, dysarthria, agnosia, amnesia, paralysis, loss of coordination, or loss of consciousness  Allergies  Ms. Skidgel is allergic to gabapentin; mobic [meloxicam]; penicillins; and sulfa antibiotics.  PFSH  Drug: Ms. Billy  reports that she does not use drugs. Alcohol:  reports that she drinks alcohol. Tobacco:  reports that she has been smoking cigarettes. She has a 45.00 pack-year smoking history. She has never used smokeless tobacco. Medical:  has a past medical history of Anxiety, Asthma, Chronic pain, COPD (chronic obstructive pulmonary disease) (HCC), Depression, DJD (degenerative joint disease), Fibromyalgia, GERD (gastroesophageal reflux disease), Headache, Hyperlipidemia, Lower extremity edema, On home oxygen therapy, Panic attack, Shortness of breath dyspnea, and Sleep disorder. Surgical: Ms. Abad  has a past surgical history that includes Abdominal surgery; Carpal tunnel release; Ankle surgery; Knee surgery; Cesarean section; Tonsillectomy; Tubal ligation; Back surgery; and Total knee arthroplasty (Left, 04/14/2016). Family: family history includes Cancer in her mother.  Constitutional Exam  General appearance: Well nourished, well developed, and well  hydrated. In no apparent acute distress Vitals:   06/22/18 1057  BP: (!) 140/92  Pulse: 89  Resp: 18  Temp: 97.9 F (36.6 C)  TempSrc: Oral  SpO2: 99%  Weight: 180 lb (81.6 kg)  Height: 5' 3" (1.6 m)   BMI Assessment: Estimated body mass index is 31.89 kg/m as calculated from the following:   Height as of this encounter: 5' 3" (1.6 m).   Weight as of this encounter: 180 lb (81.6 kg).  BMI interpretation table: BMI level Category Range association with higher incidence of chronic pain  <18 kg/m2 Underweight   18.5-24.9 kg/m2 Ideal body weight   25-29.9 kg/m2 Overweight Increased incidence by 20%  30-34.9 kg/m2 Obese (Class I) Increased incidence by 68%  35-39.9 kg/m2 Severe obesity (Class II) Increased incidence by 136%  >40 kg/m2 Extreme obesity (Class III) Increased incidence by 254%   Patient's current BMI Ideal Body weight  Body mass index is 31.89 kg/m. Ideal body weight: 52.4 kg (115 lb 8.3 oz) Adjusted ideal body weight: 64.1 kg (141 lb 5 oz)   BMI Readings from Last 4 Encounters:  06/22/18 31.89 kg/m  05/16/18 31.89 kg/m  10/04/17 34.62 kg/m  09/03/16 34.64 kg/m   Wt Readings from Last 4 Encounters:  06/22/18 180 lb (81.6 kg)  05/16/18 180 lb (81.6 kg)  10/04/17 189 lb 4.8 oz (85.9 kg)  09/03/16 195 lb 8.8 oz (88.7 kg)  Psych/Mental status: Alert, oriented x 3 (person, place, & time)       Eyes: PERLA Respiratory: No evidence of acute respiratory distress  Cervical Spine Area Exam  Skin & Axial Inspection: No masses, redness, edema, swelling, or associated skin lesions Alignment: Symmetrical Functional ROM: Decreased ROM      Stability: No instability detected Muscle Tone/Strength: Functionally intact. No obvious neuro-muscular anomalies detected. Sensory (Neurological): Musculoskeletal pain pattern Palpation: No palpable anomalies              Upper Extremity (UE) Exam    Side: Right upper extremity  Side: Left upper extremity  Skin & Extremity  Inspection: Skin color, temperature, and hair growth are WNL. No peripheral edema or cyanosis. No masses, redness, swelling, asymmetry, or associated skin lesions. No contractures.  Skin & Extremity Inspection: Skin color, temperature, and hair growth are   WNL. No peripheral edema or cyanosis. No masses, redness, swelling, asymmetry, or associated skin lesions. No contractures.  Functional ROM: Unrestricted ROM          Functional ROM: Unrestricted ROM          Muscle Tone/Strength: Functionally intact. No obvious neuro-muscular anomalies detected.  Muscle Tone/Strength: Functionally intact. No obvious neuro-muscular anomalies detected.  Sensory (Neurological): Unimpaired          Sensory (Neurological): Unimpaired          Palpation: No palpable anomalies              Palpation: No palpable anomalies              Provocative Test(s):  Phalen's test: deferred Tinel's test: deferred Apley's scratch test (touch opposite shoulder):  Action 1 (Across chest): deferred Action 2 (Overhead): deferred Action 3 (LB reach): deferred   Provocative Test(s):  Phalen's test: deferred Tinel's test: deferred Apley's scratch test (touch opposite shoulder):  Action 1 (Across chest): deferred Action 2 (Overhead): deferred Action 3 (LB reach): deferred    Thoracic Spine Area Exam  Skin & Axial Inspection: No masses, redness, or swelling Alignment: Symmetrical Functional ROM: Unrestricted ROM Stability: No instability detected Muscle Tone/Strength: Functionally intact. No obvious neuro-muscular anomalies detected. Sensory (Neurological): Musculoskeletal pain pattern Muscle strength & Tone: No palpable anomalies  Lumbar Spine Area Exam  Skin & Axial Inspection: No masses, redness, or swelling Alignment: Symmetrical Functional ROM: Unrestricted ROM       Stability: No instability detected Muscle Tone/Strength: Functionally intact. No obvious neuro-muscular anomalies detected. Sensory (Neurological):  Musculoskeletal pain pattern Palpation: No palpable anomalies       Provocative Tests: Hyperextension/rotation test: deferred today       Lumbar quadrant test (Kemp's test): deferred today       Lateral bending test: deferred today       Patrick's Maneuver: deferred today                   FABER test: deferred today                   S-I anterior distraction/compression test: deferred today         S-I lateral compression test: deferred today         S-I Thigh-thrust test: deferred today         S-I Gaenslen's test: deferred today          Gait & Posture Assessment  Ambulation: Unassisted Gait: Relatively normal for age and body habitus Posture: WNL   Lower Extremity Exam    Side: Right lower extremity  Side: Left lower extremity  Stability: No instability observed          Stability: No instability observed          Skin & Extremity Inspection: Skin color, temperature, and hair growth are WNL. No peripheral edema or cyanosis. No masses, redness, swelling, asymmetry, or associated skin lesions. No contractures.  Skin & Extremity Inspection: Skin color, temperature, and hair growth are WNL. No peripheral edema or cyanosis. No masses, redness, swelling, asymmetry, or associated skin lesions. No contractures.  Functional ROM: Unrestricted ROM                  Functional ROM: Unrestricted ROM                  Muscle Tone/Strength: Functionally intact. No obvious neuro-muscular anomalies detected.  Muscle Tone/Strength: Functionally intact. No obvious   neuro-muscular anomalies detected.  Sensory (Neurological): Unimpaired  Sensory (Neurological): Unimpaired  Palpation: No palpable anomalies  Palpation: No palpable anomalies   Assessment  Primary Diagnosis & Pertinent Problem List: The primary encounter diagnosis was Fibromyalgia. Diagnoses of Generalized anxiety disorder, Chronic pain disorder, Chronic bilateral thoracic back pain, Cervicalgia, Right hip pain, Bilateral primary  osteoarthritis of knee, Chronic knee pain after total replacement of left knee joint, Chronic pain syndrome, and Controlled substance agreement signed were also pertinent to this visit.  Status Diagnosis  Persistent Persistent Persistent 1. Fibromyalgia   2. Generalized anxiety disorder   3. Chronic pain disorder   4. Chronic bilateral thoracic back pain   5. Cervicalgia   6. Right hip pain   7. Bilateral primary osteoarthritis of knee   8. Chronic knee pain after total replacement of left knee joint   9. Chronic pain syndrome   10. Controlled substance agreement signed     General Recommendations: The pain condition that the patient suffers from is best treated with a multidisciplinary approach that involves an increase in physical activity to prevent de-conditioning and worsening of the pain cycle, as well as psychological counseling (formal and/or informal) to address the co-morbid psychological affects of pain. Treatment will often involve judicious use of pain medications and interventional procedures to decrease the pain, allowing the patient to participate in the physical activity that will ultimately produce long-lasting pain reductions. The goal of the multidisciplinary approach is to return the patient to a higher level of overall function and to restore their ability to perform activities of daily living.  62 year old female who presents with a chief complaint of diffuse whole body pain secondary to fibromyalgia along with cervical degenerative disc disease as well as axial low back pain.  Patient was previously seen at a pain clinic was discharged due to positive Valium on her urine drug screen.  She has had injections in the past which she does not remember.  She does have a history of anxiety and depression, she is on Zoloft 50 mg daily.  Patient is on disability for fibromyalgia.  She has limited income.  She is on BuSpar for anxiety.  She is not on chronic opioid therapy.  She  states that she did utilize a Suboxone from a friend in the past which was helpful.  Her UDS is positive for buprenorphine (Suboxone) which she took from a friend.  I had a detailed discussion with the patient and counseled her on medication compliance and not taking medications that are not prescribed to her.  In regards to medication management I believe the patient to be moderate to high risk for opioid therapy and we will consider buprenorphine pharmacotherapy only.  Discussed belbuca.  We will start with 150 mcg twice daily to help with her chronic pain related to fiber myalgia.  I also reviewed the patient's radiographs and there is nothing significant on her thoracic or lumbar spine as well as her SI joint x-rays to explain her diffuse widespread pain.  This is likely related to fibromyalgia and central hypersensitivity.  Patient will also sign opioid contract  Plan of Care  Pharmacotherapy (Medications Ordered): Meds ordered this encounter  Medications  . Buprenorphine HCl (BELBUCA) 150 MCG FILM    Sig: Place 150 mcg inside cheek 2 (two) times daily.    Dispense:  60 each    Refill:  1   Time Note: Greater than 50% of the 25 minute(s) of face-to-face time spent with Ms. Northrup, was spent  in counseling/coordination of care regarding: Ms. Oglesby primary cause of pain, the results of her recent test(s), the significance of each one oth the test(s) anomalies and it's corresponding characteristic pain pattern(s), the treatment plan, treatment alternatives, medication side effects, the opioid analgesic risks and possible complications, the appropriate use of her medications, realistic expectations, the goals of pain management (increased in functionality), the medication agreement and the patient's responsibilities when it comes to controlled substances.  Provider-requested follow-up: Return in about 8 weeks (around 08/17/2018) for Medication Management.  Future Appointments  Date Time  Provider [place]  08/17/2018 10:45 AM Gillis Santa, MD [facility] None    Primary Care Physician: Marinda Elk, MD Location: [facility] Note by: Gillis Santa, M.D Date: 06/22/2018; Time: 1:29 PM  Patient Instructions  1. Sign opioid agreement  Belbuca to last until 07/22/2018  has been escribed to your pharmacy.

## 2018-06-22 NOTE — Patient Instructions (Addendum)
1. Sign opioid agreement  Belbuca to last until 07/22/2018  has been escribed to your pharmacy.

## 2018-06-22 NOTE — Progress Notes (Signed)
Safety precautions to be maintained throughout the outpatient stay will include: orient to surroundings, keep bed in low position, maintain call bell within reach at all times, provide assistance with transfer out of bed and ambulation.  

## 2018-06-22 NOTE — Telephone Encounter (Signed)
Patient went to pharmacy and they told her she needs PA for meds. Pharmacy said they would fax over request.

## 2018-06-28 ENCOUNTER — Telehealth: Payer: Self-pay

## 2018-06-28 MED ORDER — BUPRENORPHINE 7.5 MCG/HR TD PTWK: 7.5000 ug/h | MEDICATED_PATCH | TRANSDERMAL | 0 refills | Status: DC

## 2018-06-28 NOTE — Telephone Encounter (Signed)
Pt need's prior auth done for last Rx, can we check on this and notify pt, she's going out of town this weekend.

## 2018-06-28 NOTE — Telephone Encounter (Signed)
Dr. Cherylann Ratel, Greta Doom has been denies by patient's insurance. Covered alternatives are Buprenorphine patch and Tramadol ER.

## 2018-06-29 NOTE — Telephone Encounter (Signed)
Contacted patient, informed her that Belbuca is not covered, that Butrans patch has been prescribed. Given instructions on how to use. Patient verbalized understanding.

## 2018-07-03 ENCOUNTER — Telehealth: Payer: Self-pay | Admitting: Student in an Organized Health Care Education/Training Program

## 2018-07-03 NOTE — Telephone Encounter (Signed)
Pt called and stated that she needs approval for pain patch refills so [facility] [address] [facility] Phone number [phone]. If theres a cheaper medication available she would rather have that and if so call in [facility].

## 2018-07-03 NOTE — Telephone Encounter (Signed)
Buprenorphine 7.5 mcg patches approved, patient called and notified. Patient states she needs the Rx transferred [facility] [address], [place] d/t they didn't have the patches. Will [facility] and get this taken care of for patient.   Attempted to [facility] [place], eternal hold. [facility], Rx for buprenorphine 7.5 mcg/h patches 1 to skin every 7 days qty 4.  Verbal order taken.

## 2018-07-31 ENCOUNTER — Telehealth: Payer: Self-pay | Admitting: Student in an Organized Health Care Education/Training Program

## 2018-07-31 NOTE — Telephone Encounter (Signed)
Patient lvmail 11-27 at 2:59 and 11-29 stating she only had 3 patches and her pharmacy is unable to get in patches. She does not have money to pay. Please advise.

## 2018-07-31 NOTE — Telephone Encounter (Signed)
Spoke with patient and she states that she is having money issues and can't afford the medication prescribed to her, Butrans patches, and also she is having difficulty finding the medication.  I instructed patient that she will need to come in and talk to Dr Cherylann RatelLateef about this.  She cancelled her f/up appt d/t illness and then was scheduled for 08/10/18.  appt calendar reflects next appt on 08/17/18.  I will send to PM-Admin to see if she can get sooner appt.

## 2018-08-01 NOTE — Telephone Encounter (Signed)
Left mvail this am asking patient to call for appt change, she is currently scheduled 08-15-18

## 2018-08-15 ENCOUNTER — Encounter: Payer: Medicare Other | Admitting: Student in an Organized Health Care Education/Training Program

## 2018-08-17 ENCOUNTER — Encounter: Payer: Medicare Other | Admitting: Student in an Organized Health Care Education/Training Program

## 2018-10-24 ENCOUNTER — Encounter: Payer: Medicare Other | Admitting: Student in an Organized Health Care Education/Training Program

## 2018-11-07 ENCOUNTER — Encounter: Payer: Medicare Other | Admitting: Student in an Organized Health Care Education/Training Program

## 2018-12-05 ENCOUNTER — Other Ambulatory Visit: Payer: Self-pay

## 2018-12-05 ENCOUNTER — Ambulatory Visit
Payer: Medicare Other | Attending: Student in an Organized Health Care Education/Training Program | Admitting: Student in an Organized Health Care Education/Training Program

## 2018-12-05 DIAGNOSIS — Z96652 Presence of left artificial knee joint: Secondary | ICD-10-CM

## 2018-12-05 DIAGNOSIS — F411 Generalized anxiety disorder: Secondary | ICD-10-CM | POA: Diagnosis not present

## 2018-12-05 DIAGNOSIS — M797 Fibromyalgia: Secondary | ICD-10-CM

## 2018-12-05 DIAGNOSIS — M25562 Pain in left knee: Secondary | ICD-10-CM

## 2018-12-05 DIAGNOSIS — M546 Pain in thoracic spine: Secondary | ICD-10-CM | POA: Diagnosis not present

## 2018-12-05 DIAGNOSIS — G894 Chronic pain syndrome: Secondary | ICD-10-CM

## 2018-12-05 DIAGNOSIS — M170 Bilateral primary osteoarthritis of knee: Secondary | ICD-10-CM

## 2018-12-05 DIAGNOSIS — M542 Cervicalgia: Secondary | ICD-10-CM

## 2018-12-05 DIAGNOSIS — M25551 Pain in right hip: Secondary | ICD-10-CM

## 2018-12-05 DIAGNOSIS — G8929 Other chronic pain: Secondary | ICD-10-CM

## 2018-12-05 MED ORDER — METHOCARBAMOL 750 MG PO TABS: 750.0000 mg | ORAL_TABLET | Freq: Three times a day (TID) | ORAL | 2 refills | Status: DC | PRN

## 2018-12-05 MED ORDER — BUPRENORPHINE 10 MCG/HR TD PTWK: 1.0000 | MEDICATED_PATCH | TRANSDERMAL | 2 refills | Status: AC

## 2018-12-05 NOTE — Progress Notes (Signed)
Yes Pain Management Encounter Note - Virtual Visit via Telephone Telehealth (real-time audio visits between healthcare provider and patient).  Patient's Phone No. & Preferred Pharmacy:  [phone] (home); [phone] (mobile); (Preferred) [phone]  [facility] [address] [facility] [place], [place] [facility] [address] [place] [address] Phone: [phone] Fax: [phone]  [facility] [place] [address] [place] [address] Phone: [phone] Fax: [phone]   Pre-screening note:  Our staff contacted Bethany Villa and offered her an "in person", "face-to-face" appointment versus a telephone encounter. She indicated preferring the telephone encounter, at this time.  Reason for Virtual Visit: COVID-19*  Social distancing based on CDC and AMA recommendations.   I contacted Bethany Villa on 12/05/2018 at 11:06 AM by telephone and clearly identified myself as Edward JollyBilal Sandra Brents, MD. I verified that I was speaking with the correct person using two identifiers (Name and date of birth: 02/26/1956).  Advanced Informed Consent I sought verbal advanced consent from Bethany Villa for telemedicine interactions and virtual visit. I informed Bethany Villa of the security and privacy concerns, risks, and limitations associated with performing an evaluation and management service by telephone. I also informed Bethany Villa of the availability of "in person" appointments and I informed her of the possibility of a patient responsible charge related to this service. Bethany Villa expressed understanding and agreed to proceed.   Historic Elements   Ms. Bethany Cairoenny E Raysor is a 63 y.o. year old, female patient evaluated today after her last encounter by our practice on 08/15/2018. Bethany Villa  has a past medical history of Anxiety, Asthma, Chronic pain, COPD (chronic obstructive pulmonary disease) (HCC), Depression, DJD (degenerative joint disease), Fibromyalgia, GERD  (gastroesophageal reflux disease), Headache, Hyperlipidemia, Lower extremity edema, On home oxygen therapy, Panic attack, Shortness of breath dyspnea, and Sleep disorder. 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 Bethany Villa has a current medication list which includes the following prescription(s): albuterol, atorvastatin, buprenorphine, buspirone, methocarbamol, sertraline, and trazodone. She  reports that she has been smoking cigarettes. She has a 45.00 pack-year smoking history. She has never used smokeless tobacco. She reports current alcohol use. She reports that she does not use drugs. Bethany Villa is allergic to gabapentin; mobic [meloxicam]; penicillins; and sulfa antibiotics.   HPI  I last saw her on 08/15/2018. She is being evaluated for medication management.   Patient states that she has moved to income dependent housing and has more money to pay for medications.  Given her financial situation prior to this, patient was unable to afford buprenorphine.  She states that she is in a better place financially.  She states that the buprenorphine patch provided her with moderate to significant pain relief from Monday to Thursday but that the analgesic benefits started to diminish on day 4-5.  She is requesting increasing her patch.  This is reasonable given the type of pain relief she experiences which can be optimized.  Recommend increasing Butrans patch to 10 mcg an hour.  We will also refill the patient's methocarbamol.   Pharmacotherapy Assessment  Analgesic: Increase to Butrans patch 10 mcg an hour, q. 7 days  Monitoring: Pharmacotherapy: No side-effects or adverse reactions reported. [place] PMP: PDMP reviewed during this encounter.       Compliance: No problems identified. Plan: Refer to "POC".  Review of  recent tests  DG Si Joints CLINICAL DATA:  63 year old female with chronic lower back and sacroiliac joint pain.  EXAM: BILATERAL SACROILIAC JOINTS - 3+ VIEW  COMPARISON:  Lumbar spine films same date dictated separately.  FINDINGS: Sacroiliac joints appear intact. No significant hip joint space narrowing. Degenerative changes lower lumbar spine.  IMPRESSION: Sacroiliac joints appear intact.  Electronically Signed   By: Lacy Duverney M.D.   On: 05/16/2018 17:45 DG Knee 1-2 Views Right CLINICAL DATA:  63 year old female with bilateral knee pain. No injury. Initial encounter.  EXAM: RIGHT KNEE - 1-2 VIEW  COMPARISON:  None.  FINDINGS: No fracture or dislocation. No significant tibiofemoral joint space narrowing. Minimal spurring posterior patella may represent minimal patellofemoral joint degenerative changes. No joint effusion.  IMPRESSION: Minimal spurring posterior patella may represent result of minimal patellofemoral joint degenerative changes. Otherwise negative.  Electronically Signed   By: Lacy Duverney M.D.   On: 05/16/2018 17:43 DG Knee 1-2 Views Left CLINICAL DATA:  Pain.  EXAM: LEFT KNEE - 1-2 VIEW  COMPARISON:  04/14/2016  FINDINGS: Hardware components of a left knee arthroplasty device are again noted. These appear to be in anatomic alignment. No periprosthetic fracture or dislocation identified. No periprosthetic lucencies identified.  IMPRESSION: 1. No acute findings. 2. Status post left knee arthroplasty.  Electronically Signed   By: Signa Kell M.D.   On: 05/16/2018 17:28 DG Lumbar Spine Complete W/Bend CLINICAL DATA:  Mid and low back pain for years, no acute injury  EXAM: LUMBAR SPINE - COMPLETE WITH BENDING VIEWS  COMPARISON:  Lumbar spine films of 10/17/2014  FINDINGS: The lumbar vertebrae are unchanged in alignment. There is mild degenerative disc disease diffusely with some loss of disc space and mild spurring. No acute  compression deformity is seen. There is degenerative change of the facet joints of L4-5 and L5-S1. Through flexion and extension there is relatively normal range of motion with no malalignment. Moderate abdominal aortic atherosclerosis is present.  IMPRESSION: 1. No change in alignment with mild degenerative disc disease diffusely. 2. Relatively normal range of motion with no malalignment.  Electronically Signed   By: Dwyane Dee M.D.   On: 05/16/2018 17:25 DG Thoracic Spine 2 View CLINICAL DATA:  Upper and lower back pain, no injury  EXAM: THORACIC SPINE 2 VIEWS  COMPARISON:  Chest x-ray of 10/02/2017 and 09/03/2016  FINDINGS: The thoracic vertebrae remain unchanged in alignment. No compression deformity is seen. Intervertebral disc spaces appear normal. Degenerative change is noted in the mid to lower thoracic spine. No prominent paravertebral soft tissue is noted.  IMPRESSION: Mild degenerative change in the mid to lower thoracic spine. No acute abnormality.  Electronically Signed   By: Dwyane Dee M.D.   On: 05/16/2018 17:24 DG Cervical Spine With Flex & Extend CLINICAL DATA:  Neck and back pain for years, no trauma  EXAM: CERVICAL SPINE COMPLETE WITH FLEXION AND EXTENSION VIEWS  COMPARISON:  None.  FINDINGS: The cervical vertebrae are somewhat straightened in alignment. There is mild retrolisthesis of C5 on C6 of 2 mm most likely due to degenerative change of the facet joints. There is degenerative disc disease at C5-6 with loss of disc space and sclerosis. The remainder of intervertebral disc spaces appear normal. No prevertebral soft tissue swelling is noted. Only mild foraminal narrowing is present bilaterally at C5-6. The lung apices are clear and the odontoid process is intact.  Through flexion and extension there is relatively normal range of motion with no malalignment. No  change in the slight retrolisthesis of C5 on C6 is seen.  IMPRESSION: 1.  Straightened alignment with degenerative disc disease at C5-6. 2. Mild foraminal narrowing bilaterally at C5-6. 3. No change in slight retrolisthesis of C5 on C6 through flexion and extension with relatively normal alignment maintained.  Electronically Signed   By: Dwyane Dee M.D.   On: 05/16/2018 17:23   Office Visit on 05/16/2018  Component Date Value Ref Range Status  . Summary 05/16/2018 FINAL   Final   Comment: ==================================================================== TOXASSURE COMP DRUG ANALYSIS,UR ==================================================================== Test                             Result       Flag       Units Drug Present and Declared for Prescription Verification   Sertraline                     PRESENT      EXPECTED   Desmethylsertraline            PRESENT      EXPECTED    Desmethylsertraline is an expected metabolite of sertraline.   Trazodone                      PRESENT      EXPECTED   1,3 chlorophenyl piperazine    PRESENT      EXPECTED    1,3-chlorophenyl piperazine is an expected metabolite of    trazodone. Drug Present not Declared for Prescription Verification   Buprenorphine                  11           UNEXPECTED ng/mg creat   Norbuprenorphine               45           UNEXPECTED ng/mg creat    Source of buprenorphine is a scheduled prescription medication.    Norbuprenorphine is an expected metabolite of buprenorphine.   Methocarb                          amol                  PRESENT      UNEXPECTED Drug Absent but Declared for Prescription Verification   Tizanidine                     Not Detected UNEXPECTED    Tizanidine, as indicated in the declared medication list, is not    always detected even when used as directed. ==================================================================== Test                      Result    Flag   Units      Ref Range   Creatinine              126              mg/dL       >=16 ==================================================================== Declared Medications:  The flagging and interpretation on this report are based on the  following declared medications.  Unexpected results may arise from  inaccuracies in the declared medications.  **Note: The testing scope of this panel includes these medications:  Sertraline (Zoloft)  Trazodone  **Note: The testing scope of this panel does  not include small to  moderate amounts of these reported medications:  Tizanidine  **Note: The testing                           scope of this panel does not include following  reported medications:  Albuterol (Proventil)  Atorvastatin (Lipitor)  Buspirone (BuSpar) ==================================================================== For clinical consultation, please call [phone]. ====================================================================    Assessment  The primary encounter diagnosis was Fibromyalgia. Diagnoses of Generalized anxiety disorder, Chronic pain disorder, Chronic bilateral thoracic back pain, Cervicalgia, Right hip pain, Bilateral primary osteoarthritis of knee, Chronic pain syndrome, and Chronic knee pain after total replacement of left knee joint were also pertinent to this visit.   Patient states that she has moved to income dependent housing and has more money to pay for medications.  Given her financial situation prior to this, patient was unable to afford buprenorphine.  She states that she is in a better place financially.  She states that the buprenorphine patch provided her with moderate to significant pain relief from Monday to Thursday but that the analgesic benefits started to diminish on day 4-5 (q7 day patch).  She is requesting increasing her patch.  This is reasonable given the type of pain relief she experiences which can be optimized.  Recommend increasing Butrans patch to 10 mcg an hour, q7 days.  We will also refill the patient's  methocarbamol as below.  She is instructed to discontinue tizanidine.  Plan of Care  I have discontinued Allicia E. Asbridge's tiZANidine, Buprenorphine HCl, and Buprenorphine. I am also having her start on buprenorphine and methocarbamol. Additionally, I am having her maintain her atorvastatin, albuterol, sertraline, traZODone, and busPIRone.  Pharmacotherapy (Medications Ordered): Meds ordered this encounter  Medications  . buprenorphine (BUTRANS) 10 MCG/HR PTWK patch    Sig: Place 1 patch onto the skin every 7 (seven) days.    Dispense:  4 patch    Refill:  2    Do not place this medication, or any other prescription from our practice, on "Automatic Refill". Patient may have prescription filled one day early if pharmacy is closed on scheduled refill date.  . methocarbamol (ROBAXIN) 750 MG tablet    Sig: Take 1 tablet (750 mg total) by mouth every 8 (eight) hours as needed for muscle spasms.    Dispense:  90 tablet    Refill:  2    Do not place this medication, or any other prescription from our practice, on "Automatic Refill". Patient may have prescription filled one day early if pharmacy is closed on scheduled refill date.    Follow-up plan:   Return in about 3 months (around 03/06/2019) for Medication Management.   I discussed the assessment and treatment plan with the patient. The patient was provided an opportunity to ask questions and all were answered. The patient agreed with the plan and demonstrated an understanding of the instructions.  Patient advised to call back or seek an in-person evaluation if the symptoms or condition worsens.  Total duration of non-face-to-face encounter: 21 minutes.  Note by: Edward Jolly, MD Date: 12/05/2018; Time: 11:06 AM  Disclaimer:  * Given the special circumstances of the COVID-19 pandemic, the federal government has announced that the Office for Civil Rights (OCR) will exercise its enforcement discretion and will not impose penalties on  physicians using telehealth in the event of noncompliance with regulatory requirements under the [employer] Portability and Accountability Act (HIPAA) in connection with the  good faith provision of telehealth during the MVHQI-69 national public health emergency. (AMA)

## 2019-02-27 ENCOUNTER — Other Ambulatory Visit: Payer: Self-pay | Admitting: Nurse Practitioner

## 2019-03-06 ENCOUNTER — Encounter: Payer: Medicare Other | Admitting: Student in an Organized Health Care Education/Training Program

## 2019-04-26 ENCOUNTER — Encounter: Payer: Self-pay | Admitting: Student in an Organized Health Care Education/Training Program

## 2019-04-26 ENCOUNTER — Ambulatory Visit
Payer: Medicare Other | Attending: Student in an Organized Health Care Education/Training Program | Admitting: Student in an Organized Health Care Education/Training Program

## 2019-04-26 ENCOUNTER — Telehealth: Payer: Self-pay | Admitting: Student in an Organized Health Care Education/Training Program

## 2019-04-26 ENCOUNTER — Other Ambulatory Visit: Payer: Self-pay

## 2019-04-26 DIAGNOSIS — M797 Fibromyalgia: Secondary | ICD-10-CM

## 2019-04-26 DIAGNOSIS — M25562 Pain in left knee: Secondary | ICD-10-CM

## 2019-04-26 DIAGNOSIS — M170 Bilateral primary osteoarthritis of knee: Secondary | ICD-10-CM | POA: Diagnosis not present

## 2019-04-26 DIAGNOSIS — G8929 Other chronic pain: Secondary | ICD-10-CM

## 2019-04-26 DIAGNOSIS — F411 Generalized anxiety disorder: Secondary | ICD-10-CM

## 2019-04-26 DIAGNOSIS — M546 Pain in thoracic spine: Secondary | ICD-10-CM | POA: Diagnosis not present

## 2019-04-26 DIAGNOSIS — M25551 Pain in right hip: Secondary | ICD-10-CM

## 2019-04-26 DIAGNOSIS — G894 Chronic pain syndrome: Secondary | ICD-10-CM

## 2019-04-26 DIAGNOSIS — Z96652 Presence of left artificial knee joint: Secondary | ICD-10-CM

## 2019-04-26 MED ORDER — BUPRENORPHINE 15 MCG/HR TD PTWK: 15.0000 ug/h | MEDICATED_PATCH | TRANSDERMAL | 3 refills | Status: AC

## 2019-04-26 NOTE — Progress Notes (Signed)
Pain Management Virtual Encounter Note - Virtual Visit via [place] (real-time audio visits between healthcare provider and patient).   Patient's Phone No. & Preferred Pharmacy:  [phone] (home); [phone] (mobile); (Preferred) [phone] [email or web address]  [id number] - [place], [place] - [address] [place] [address] Phone: [phone] Fax: [place], [address] [place] [address] Phone: [phone] Fax: [phone]    Pre-screening note:  Our staff contacted Ms. Sabra Heck and offered her an "in person", "face-to-face" appointment versus a telephone encounter. She indicated preferring the telephone encounter, at this time.   Reason for Virtual Visit: COVID-19*  Social distancing based on CDC and AMA recommendations.   I contacted Loma Boston on 04/26/2019 via video conference.      I clearly identified myself as Gillis Santa, MD. I verified that I was speaking with the correct person using two identifiers (Name: NGA RABON, and date of birth: [birth year]).  Advanced Informed Consent I sought verbal advanced consent from Loma Boston for virtual visit interactions. I informed Ms. Sabra Heck of possible security and privacy concerns, risks, and limitations associated with providing "not-in-person" medical evaluation and management services. I also informed Ms. Sabra Heck of the availability of "in-person" appointments. Finally, I informed her that there would be a charge for the virtual visit and that she could be  personally, fully or partially, financially responsible for it. Ms. Berti expressed understanding and agreed to proceed.   Historic Elements   Ms. DIANDRA CIMINI is a 63 y.o. year old, female patient evaluated today after her last encounter by our practice on 04/26/2019. Ms. Esch  has a past medical history of Anxiety, Asthma, Chronic pain,  COPD (chronic obstructive pulmonary disease) ([place]), Depression, DJD (degenerative joint disease), Fibromyalgia, GERD (gastroesophageal reflux disease), Headache, Hyperlipidemia, Lower extremity edema, On home oxygen therapy, Panic attack, Shortness of breath dyspnea, and Sleep disorder. 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 Ms. Petree has a current medication list which includes the following prescription(s): albuterol, atorvastatin, buprenorphine, buspirone, methocarbamol, sertraline, and trazodone. She  reports that she has been smoking cigarettes. She has a 45.00 pack-year smoking history. She has never used smokeless tobacco. She reports current alcohol use. She reports that she does not use drugs. Ms. Pickler is allergic to gabapentin; mobic [meloxicam]; penicillins; and sulfa antibiotics.   HPI  Today, she is being contacted for medication management.   No change in medical history since last visit.  Patient's pain is at baseline.  Patient continues multimodal pain regimen as prescribed.  States that it provides some pain relief and improvement in functional status however states that she does have increased pain in the evening.  She states that the medication has helped but there could be room for improvement.  We discussed increasing to 15 mcg patch and no further dose escalation beyond this.  Risk benefits reviewed.  Patient agreement with plan.   Pharmacotherapy Assessment  Analgesic: Butrans patch 10 mcg an hour, increase to 15 mcg an hour to optimize pain management  Monitoring: Pharmacotherapy: No side-effects or adverse reactions reported. [place] PMP: PDMP reviewed during this encounter.       Compliance: No problems identified. Effectiveness: Clinically acceptable. Plan: Refer to "POC".  UDS:   Summary  Date Value Ref Range Status  05/16/2018 FINAL  Final    Comment:    ==================================================================== TOXASSURE COMP DRUG ANALYSIS,UR ==================================================================== Test                             Result       Flag       Units Drug Present and Declared for Prescription Verification   Sertraline                     PRESENT      EXPECTED   Desmethylsertraline            PRESENT      EXPECTED    Desmethylsertraline is an expected metabolite of sertraline.   Trazodone                      PRESENT      EXPECTED   1,3 chlorophenyl piperazine    PRESENT      EXPECTED    1,3-chlorophenyl piperazine is an expected metabolite of    trazodone. Drug Present not Declared for Prescription Verification   Buprenorphine                  11           UNEXPECTED ng/mg creat   Norbuprenorphine               45           UNEXPECTED ng/mg creat    Source of buprenorphine is a scheduled prescription medication.    Norbuprenorphine is an expected metabolite of buprenorphine.   Methocarbamol                  PRESENT      UNEXPECTED Drug Absent but Declared for Prescription Verification   Tizanidine                     Not Detected UNEXPECTED    Tizanidine, as indicated in the declared medication list, is not    always detected even when used as directed. ==================================================================== Test                      Result    Flag   Units      Ref Range   Creatinine              126              mg/dL      >=78 ==================================================================== Declared Medications:  The flagging and interpretation on this report are based on the  following declared medications.  Unexpected results may arise from  inaccuracies in the declared medications.  **Note: The testing scope of this panel includes these medications:  Sertraline (Zoloft)  Trazodone  **Note: The  testing scope of this panel does not include small to  moderate amounts of these reported medications:  Tizanidine  **Note: The testing scope of this panel does not include following  reported medications:  Albuterol (Proventil)  Atorvastatin (Lipitor)  Buspirone (BuSpar) ==================================================================== For clinical consultation, please call [phone]. ====================================================================    Laboratory Chemistry Profile (12 mo)  Renal: No results found for requested labs within last 8760 hours.  Lab Results  Component Value Date   GFRAA >60 10/06/2017   GFRNONAA >60 10/06/2017   Hepatic: No results found for requested labs within last 8760 hours. Lab Results  Component Value Date  AST 58 (H) 10/01/2017   ALT 61 (H) 10/01/2017   Other: No results found for requested labs within last 8760 hours. Note: Above Lab results reviewed.  Assessment  The primary encounter diagnosis was Chronic pain disorder. Diagnoses of Bilateral primary osteoarthritis of knee, Chronic bilateral thoracic back pain, Fibromyalgia, Chronic pain syndrome, Chronic knee pain after total replacement of left knee joint, Generalized anxiety disorder, and Right hip pain were also pertinent to this visit.  Plan of Care  I am having Basilio CairoPenny E. Brix start on Buprenorphine. I am also having her maintain her atorvastatin, albuterol, sertraline, traZODone, busPIRone, and methocarbamol.  Pharmacotherapy (Medications Ordered): Meds ordered this encounter  Medications  . Buprenorphine 15 MCG/HR PTWK    Sig: Place 15 mcg/hr onto the skin every 7 (seven) days.    Dispense:  4 patch    Refill:  3    Do not place this medication, or any other prescription from our practice, on "Automatic Refill". Patient may have prescription filled one day early if pharmacy is closed on scheduled refill date.   Orders:  Orders Placed This Encounter  Procedures   . ToxASSURE Select 13 (MW), Urine    Volume: 30 ml(s). Minimum 3 ml of urine is needed. Document temperature of fresh sample. Indications: Long term (current) use of opiate analgesic (Z61.096(Z79.891)   Follow-up plan:   Return in about 4 months (around 08/26/2019) for Medication Management, virtual.    Recent Visits No visits were found meeting these conditions.  Showing recent visits within past 90 days and meeting all other requirements   Today's Visits Date Type Provider Dept  04/26/19 Office Visit Edward JollyLateef, Magnum Lunde, MD [facility]  Showing today's visits and meeting all other requirements   Future Appointments No visits were found meeting these conditions.  Showing future appointments within next 90 days and meeting all other requirements   I discussed the assessment and treatment plan with the patient. The patient was provided an opportunity to ask questions and all were answered. The patient agreed with the plan and demonstrated an understanding of the instructions.  Patient advised to call back or seek an in-person evaluation if the symptoms or condition worsens.  Total duration of non-face-to-face encounter: 25 minutes.  Note by: Edward JollyBilal Leilany Digeronimo, MD Date: 04/26/2019; Time: 3:36 PM  Note: This dictation was prepared with Dragon dictation. Any transcriptional errors that may result from this process are unintentional.  Disclaimer:  * Given the special circumstances of the COVID-19 pandemic, the federal government has announced that the Office for Civil Rights (OCR) will exercise its enforcement discretion and will not impose penalties on physicians using telehealth in the event of noncompliance with regulatory requirements under the [employer] Insurance Portability and Accountability Act (HIPAA) in connection with the good faith provision of telehealth during the COVID-19 national public health emergency. (AMA)

## 2019-04-26 NOTE — Telephone Encounter (Signed)
Buprenorphine last prescribed 11/2018. Please advise.

## 2019-04-26 NOTE — Telephone Encounter (Signed)
Patient called to say she is out of pain patches. She does not recall having been told about her July Appt that is marked as no show. Should I add her to today for VV Med call?

## 2019-04-26 NOTE — Telephone Encounter (Signed)
Ok patient added to schedule Virtual Visit

## 2019-04-30 ENCOUNTER — Other Ambulatory Visit: Payer: Self-pay | Admitting: Student in an Organized Health Care Education/Training Program

## 2019-04-30 NOTE — Telephone Encounter (Signed)
She wants to know if you can change her robaxin  to soma instead. She tried one of her neighbors and it worked better.

## 2019-04-30 NOTE — Telephone Encounter (Signed)
There are NO medication changes without an evaluation to discuss. Needs evaluation appointment--- and this will not guarantee a change in medications.

## 2019-05-01 ENCOUNTER — Other Ambulatory Visit: Payer: Self-pay

## 2019-05-01 ENCOUNTER — Encounter: Payer: Self-pay | Admitting: Student in an Organized Health Care Education/Training Program

## 2019-05-01 ENCOUNTER — Ambulatory Visit
Payer: Medicare Other | Attending: Student in an Organized Health Care Education/Training Program | Admitting: Student in an Organized Health Care Education/Training Program

## 2019-05-01 DIAGNOSIS — M542 Cervicalgia: Secondary | ICD-10-CM

## 2019-05-01 DIAGNOSIS — G894 Chronic pain syndrome: Secondary | ICD-10-CM

## 2019-05-01 DIAGNOSIS — M170 Bilateral primary osteoarthritis of knee: Secondary | ICD-10-CM | POA: Diagnosis not present

## 2019-05-01 DIAGNOSIS — M797 Fibromyalgia: Secondary | ICD-10-CM

## 2019-05-01 DIAGNOSIS — M546 Pain in thoracic spine: Secondary | ICD-10-CM | POA: Diagnosis not present

## 2019-05-01 DIAGNOSIS — G8929 Other chronic pain: Secondary | ICD-10-CM

## 2019-05-01 DIAGNOSIS — M25562 Pain in left knee: Secondary | ICD-10-CM

## 2019-05-01 DIAGNOSIS — Z96652 Presence of left artificial knee joint: Secondary | ICD-10-CM

## 2019-05-01 DIAGNOSIS — F411 Generalized anxiety disorder: Secondary | ICD-10-CM

## 2019-05-01 DIAGNOSIS — M25551 Pain in right hip: Secondary | ICD-10-CM

## 2019-05-01 MED ORDER — METHOCARBAMOL 750 MG PO TABS: 750.0000 mg | ORAL_TABLET | Freq: Three times a day (TID) | ORAL | 2 refills | Status: DC | PRN

## 2019-05-01 NOTE — Progress Notes (Signed)
Pain Management Virtual Encounter Note - Virtual Visit via Video Conference Telehealth (real-time audio visits between healthcare provider and patient).   Patient's Phone No. & Preferred Pharmacy:  [phone] (home); [phone] (mobile); (Preferred) [phone] [id number] [email or web address]  [facility] [id number] - [place], [place] - [address] [place] [address] Phone: [phone] Fax: [phone]  [facility] [place] [address] [place] [address] Phone: [phone] Fax: [phone]    Pre-screening note:  Our staff contacted [name] and offered her an "in person", "face-to-face" appointment versus a telephone encounter. She indicated preferring the telephone encounter, at this time.   Reason for Virtual Visit: COVID-19*  Social distancing based on CDC and AMA recommendations.   I contacted Bethany Villa on 05/01/2019 via video conference.      I clearly identified myself as Edward JollyBilal Shia Eber, MD. I verified that I was speaking with the correct person using two identifiers (Name: Bethany Cairoenny E Villa, and date of birth: 11/27/1955).  Advanced Informed Consent I sought verbal advanced consent from Bethany Villa for virtual visit interactions. I informed [name] of possible security and privacy concerns, risks, and limitations associated with providing "not-in-person" medical evaluation and management services. I also informed [name] of the availability of "in-person" appointments. Finally, I informed her that there would be a charge for the virtual visit and that she could be  personally, fully or partially, financially responsible for it. [name] expressed understanding and agreed to proceed.   Historic Elements   [name] is a 63 y.o. year old, female patient evaluated today after her last encounter by our practice on 04/30/2019. [name]  has a past medical history of Anxiety, Asthma, Chronic pain,  COPD (chronic obstructive pulmonary disease) ([facility]), Depression, DJD (degenerative joint disease), Fibromyalgia, GERD (gastroesophageal reflux disease), Headache, Hyperlipidemia, Lower extremity edema, On home oxygen therapy, Panic attack, Shortness of breath dyspnea, and Sleep disorder. 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 [name] has a current medication list which includes the following prescription(s): albuterol, atorvastatin, buprenorphine, buspirone, methocarbamol, sertraline, and trazodone. She  reports that she has been smoking cigarettes. She has a 45.00 pack-year smoking history. She has never used smokeless tobacco. She reports current alcohol use. She reports that she does not use drugs. [name] is allergic to gabapentin; mobic [meloxicam]; penicillins; and sulfa antibiotics.   HPI  Today, she is being contacted for medication management.   Refill Robaxin as below. Doing well with higher dose of Butrans at 15 mcg.   UDS:  Summary  Date Value Ref Range Status  05/16/2018 FINAL  Final    Comment:    ==================================================================== TOXASSURE COMP DRUG ANALYSIS,UR ==================================================================== Test                             Result       Flag       Units Drug Present and Declared for Prescription Verification   Sertraline                     PRESENT      EXPECTED   Desmethylsertraline            PRESENT      EXPECTED    Desmethylsertraline is an  expected metabolite of sertraline.   Trazodone                      PRESENT      EXPECTED   1,3 chlorophenyl piperazine    PRESENT      EXPECTED    1,3-chlorophenyl piperazine is an expected metabolite of    trazodone. Drug Present not Declared for Prescription  Verification   Buprenorphine                  11           UNEXPECTED ng/mg creat   Norbuprenorphine               45           UNEXPECTED ng/mg creat    Source of buprenorphine is a scheduled prescription medication.    Norbuprenorphine is an expected metabolite of buprenorphine.   Methocarbamol                  PRESENT      UNEXPECTED Drug Absent but Declared for Prescription Verification   Tizanidine                     Not Detected UNEXPECTED    Tizanidine, as indicated in the declared medication list, is not    always detected even when used as directed. ==================================================================== Test                      Result    Flag   Units      Ref Range   Creatinine              126              mg/dL      >=20 ==================================================================== Declared Medications:  The flagging and interpretation on this report are based on the  following declared medications.  Unexpected results may arise from  inaccuracies in the declared medications.  **Note: The testing scope of this panel includes these medications:  Sertraline (Zoloft)  Trazodone  **Note: The testing scope of this panel does not include small to  moderate amounts of these reported medications:  Tizanidine  **Note: The testing scope of this panel does not include following  reported medications:  Albuterol (Proventil)  Atorvastatin (Lipitor)  Buspirone (BuSpar) ==================================================================== For clinical consultation, please call [phone]. ====================================================================    Laboratory Chemistry Profile (12 mo)  Renal: No results found for requested labs within last 8760 hours.  Lab Results  Component Value Date   GFRAA >60 10/06/2017   GFRNONAA >60 10/06/2017   Hepatic: No results found for requested labs within last 8760 hours. Lab Results  Component Value Date    AST 58 (H) 10/01/2017   ALT 61 (H) 10/01/2017   Other: No results found for requested labs within last 8760 hours. Note: Above Lab results reviewed.  Assessment  The primary encounter diagnosis was Chronic pain disorder. Diagnoses of Bilateral primary osteoarthritis of knee, Chronic bilateral thoracic back pain, Fibromyalgia, Chronic pain syndrome, Chronic knee pain after total replacement of left knee joint, Generalized anxiety disorder, Right hip pain, and Cervicalgia were also pertinent to this visit.  Plan of Care  I am having Bethany Villa maintain her atorvastatin, albuterol, sertraline, traZODone, busPIRone, Buprenorphine, and methocarbamol.  Pharmacotherapy (Medications Ordered): Meds ordered this encounter  Medications  . methocarbamol (ROBAXIN) 750 MG tablet    Sig: Take 1  tablet (750 mg total) by mouth every 8 (eight) hours as needed for muscle spasms.    Dispense:  90 tablet    Refill:  2    Do not place this medication, or any other prescription from our practice, on "Automatic Refill". Patient may have prescription filled one day early if pharmacy is closed on scheduled refill date.    Follow-up plan:   Return for Keep sch. appt.    Recent Visits Date Type Provider Dept  04/26/19 Office Visit Edward Jolly, MD [facility]  Showing recent visits within past 90 days and meeting all other requirements   Today's Visits Date Type Provider Dept  05/01/19 Office Visit Edward Jolly, MD [facility]  Showing today's visits and meeting all other requirements   Future Appointments No visits were found meeting these conditions.  Showing future appointments within next 90 days and meeting all other requirements   I discussed the assessment and treatment plan with the patient. The patient was provided an opportunity to ask questions and all were answered. The patient agreed with the plan and demonstrated an understanding of the instructions.  Patient  advised to call back or seek an in-person evaluation if the symptoms or condition worsens.  Total duration of non-face-to-face encounter: 15 minutes.  Note by: Edward Jolly, MD Date: 05/01/2019; Time: 10:41 AM  Note: This dictation was prepared with Dragon dictation. Any transcriptional errors that may result from this process are unintentional.  Disclaimer:  * Given the special circumstances of the COVID-19 pandemic, the federal government has announced that the Office for Civil Rights (OCR) will exercise its enforcement discretion and will not impose penalties on physicians using telehealth in the event of noncompliance with regulatory requirements under the [employer] Portability and Accountability Act (HIPAA) in connection with the good faith provision of telehealth during the COVID-19 national public health emergency. (AMA)

## 2019-06-10 ENCOUNTER — Encounter: Payer: Self-pay | Admitting: *Deleted

## 2019-06-10 ENCOUNTER — Inpatient Hospital Stay
Admission: EM | Admit: 2019-06-10 | Discharge: 2019-06-13 | DRG: 202 | Disposition: A | Discharge status: Home / Self Care | Payer: Medicare Other | Attending: Internal Medicine | Admitting: Internal Medicine

## 2019-06-10 ENCOUNTER — Other Ambulatory Visit: Payer: Self-pay

## 2019-06-10 ENCOUNTER — Emergency Department: Payer: Medicare Other

## 2019-06-10 DIAGNOSIS — Z23 Encounter for immunization: Secondary | ICD-10-CM

## 2019-06-10 DIAGNOSIS — R0602 Shortness of breath: Secondary | ICD-10-CM

## 2019-06-10 DIAGNOSIS — J440 Chronic obstructive pulmonary disease with acute lower respiratory infection: Secondary | ICD-10-CM | POA: Diagnosis present

## 2019-06-10 DIAGNOSIS — J441 Chronic obstructive pulmonary disease with (acute) exacerbation: Secondary | ICD-10-CM | POA: Diagnosis present

## 2019-06-10 DIAGNOSIS — K219 Gastro-esophageal reflux disease without esophagitis: Secondary | ICD-10-CM | POA: Diagnosis present

## 2019-06-10 DIAGNOSIS — I4510 Unspecified right bundle-branch block: Secondary | ICD-10-CM | POA: Diagnosis present

## 2019-06-10 DIAGNOSIS — Z886 Allergy status to analgesic agent status: Secondary | ICD-10-CM

## 2019-06-10 DIAGNOSIS — Z882 Allergy status to sulfonamides status: Secondary | ICD-10-CM

## 2019-06-10 DIAGNOSIS — F418 Other specified anxiety disorders: Secondary | ICD-10-CM | POA: Diagnosis present

## 2019-06-10 DIAGNOSIS — G8929 Other chronic pain: Secondary | ICD-10-CM | POA: Diagnosis present

## 2019-06-10 DIAGNOSIS — I5033 Acute on chronic diastolic (congestive) heart failure: Secondary | ICD-10-CM | POA: Diagnosis present

## 2019-06-10 DIAGNOSIS — E669 Obesity, unspecified: Secondary | ICD-10-CM | POA: Diagnosis present

## 2019-06-10 DIAGNOSIS — M797 Fibromyalgia: Secondary | ICD-10-CM | POA: Diagnosis present

## 2019-06-10 DIAGNOSIS — Z803 Family history of malignant neoplasm of breast: Secondary | ICD-10-CM

## 2019-06-10 DIAGNOSIS — Z880 Allergy status to penicillin: Secondary | ICD-10-CM

## 2019-06-10 DIAGNOSIS — R0902 Hypoxemia: Secondary | ICD-10-CM | POA: Diagnosis present

## 2019-06-10 DIAGNOSIS — Z888 Allergy status to other drugs, medicaments and biological substances status: Secondary | ICD-10-CM

## 2019-06-10 DIAGNOSIS — J209 Acute bronchitis, unspecified: Secondary | ICD-10-CM | POA: Diagnosis not present

## 2019-06-10 DIAGNOSIS — E785 Hyperlipidemia, unspecified: Secondary | ICD-10-CM | POA: Diagnosis present

## 2019-06-10 DIAGNOSIS — Z79899 Other long term (current) drug therapy: Secondary | ICD-10-CM

## 2019-06-10 DIAGNOSIS — Z6830 Body mass index (BMI) 30.0-30.9, adult: Secondary | ICD-10-CM

## 2019-06-10 DIAGNOSIS — Z96652 Presence of left artificial knee joint: Secondary | ICD-10-CM | POA: Diagnosis present

## 2019-06-10 DIAGNOSIS — F17213 Nicotine dependence, cigarettes, with withdrawal: Secondary | ICD-10-CM | POA: Diagnosis present

## 2019-06-10 DIAGNOSIS — Z20828 Contact with and (suspected) exposure to other viral communicable diseases: Secondary | ICD-10-CM | POA: Diagnosis present

## 2019-06-10 DIAGNOSIS — F410 Panic disorder [episodic paroxysmal anxiety] without agoraphobia: Secondary | ICD-10-CM | POA: Diagnosis present

## 2019-06-10 LAB — CBC WITH DIFFERENTIAL/PLATELET
Abs Immature Granulocytes: 0.03 10*3/uL (ref 0.00–0.07)
Basophils Absolute: 0 10*3/uL (ref 0.0–0.1)
Basophils Relative: 0 %
Eosinophils Absolute: 0.2 10*3/uL (ref 0.0–0.5)
Eosinophils Relative: 3 %
HCT: 44.6 % (ref 36.0–46.0)
Hemoglobin: 13.8 g/dL (ref 12.0–15.0)
Immature Granulocytes: 0 %
Lymphocytes Relative: 36 %
Lymphs Abs: 2.6 10*3/uL (ref 0.7–4.0)
MCH: 28.7 pg (ref 26.0–34.0)
MCHC: 30.9 g/dL (ref 30.0–36.0)
MCV: 92.7 fL (ref 80.0–100.0)
Monocytes Absolute: 0.6 10*3/uL (ref 0.1–1.0)
Monocytes Relative: 9 %
Neutro Abs: 3.7 10*3/uL (ref 1.7–7.7)
Neutrophils Relative %: 52 %
Platelets: 143 10*3/uL — ABNORMAL LOW (ref 150–400)
RBC: 4.81 MIL/uL (ref 3.87–5.11)
RDW: 14.5 % (ref 11.5–15.5)
WBC: 7.2 10*3/uL (ref 4.0–10.5)
nRBC: 0 % (ref 0.0–0.2)

## 2019-06-10 MED ORDER — IPRATROPIUM-ALBUTEROL 0.5-2.5 (3) MG/3ML IN SOLN
3.0000 mL | Freq: Once | RESPIRATORY_TRACT | Status: AC
  Administered 2019-06-11: 3 mL via RESPIRATORY_TRACT
  Filled 2019-06-10: qty 3

## 2019-06-10 NOTE — ED Triage Notes (Signed)
Pt arrived via [facility]. Pt states over the past 3 weeks she has had increased shob and just started coughing today. Pt had had used several nebulizer at home. EMS gave 2gms mag and 125 solumedrol. Pt with audible wheezing. A&o x4 good historian

## 2019-06-10 NOTE — ED Provider Notes (Signed)
[facility] Emergency Department Provider Note   ____________________________________________   First MD Initiated Contact with Patient 06/10/19 2310     (approximate)  I have reviewed the triage vital signs and the nursing notes.   HISTORY  Chief Complaint Shortness of breath   HPI Bethany Villa is a 63 y.o. female brought to the ED from home via EMS with a chief complaint of shortness of breath.  Patient has a history of COPD not on oxygen who reports increased shortness of breath over the past 3 weeks.  Dry cough began yesterday.  Patient states she cannot move from room to room in her house without getting severely winded.  Use several nebulizers prior to arrival without relief of symptoms.  EMS administered 125 mg IV Solu-Medrol and 2 g IV magnesium.  Denies chest pain, abdominal pain, nausea, vomiting or diarrhea.  Denies recent travel or trauma.       Past Medical History:  Diagnosis Date  . Anxiety   . Asthma   . Chronic pain    lower back  . COPD (chronic obstructive pulmonary disease) (HCC)   . Depression   . DJD (degenerative joint disease)   . Fibromyalgia   . GERD (gastroesophageal reflux disease)   . Headache   . Hyperlipidemia   . Lower extremity edema   . On home oxygen therapy    has not been on since 2016  . Panic attack   . Shortness of breath dyspnea   . Sleep disorder     Patient Active Problem List   Diagnosis Date Noted  . Bilateral primary osteoarthritis of knee 04/26/2019  . Chronic bilateral thoracic back pain 12/05/2018  . Cervicalgia 12/05/2018  . 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 Acute adjustment disorder with anxiety 10/06/2017  . Palliative care by specialist   . Respiratory failure (HCC) 10/01/2017  . COPD exacerbation (HCC) 09/01/2016  . Elevated brain natriuretic peptide (BNP) level 09/01/2016  . LFT elevation 09/01/2016  . Total knee replacement status  04/14/2016  . Chronic obstructive pulmonary disease with acute lower respiratory infection (HCC)   . Acute respiratory failure with hypoxia and hypercapnia (HCC)   . Sepsis (HCC) 09/03/2015  . CAP (community acquired pneumonia) 09/01/2015  . SIRS (systemic inflammatory response syndrome) (HCC) 07/24/2014  . COPD with acute exacerbation (HCC) 09/29/2013  . Insomnia 11/04/2012  . Generalized anxiety disorder 11/04/2012  . Depression 11/04/2012  . Fibromyalgia 11/04/2012  . Metabolic encephalopathy 10/03/2011  . COPD (chronic obstructive pulmonary disease) (HCC) 06/01/2011  . Smoking 05/19/2011  . Chronic pain disorder 05/19/2011  . Obesity 05/19/2011  . Hyperglycemia 05/19/2011    Past Surgical History:  Procedure Laterality Date  . ABDOMINAL SURGERY    . ANKLE SURGERY    . BACK SURGERY     lower  . CARPAL TUNNEL RELEASE    . CESAREAN SECTION    . KNEE SURGERY    . TONSILLECTOMY    . TOTAL KNEE ARTHROPLASTY Left 04/14/2016   Procedure: TOTAL KNEE ARTHROPLASTY;  Surgeon: Deeann SaintHoward Bry, MD;  Location: [facility] ORS;  Service: Orthopedics;  Laterality: Left;  . TUBAL LIGATION      Prior to Admission medications   Medication Sig Start Date End Date Taking? Authorizing Provider  albuterol (PROVENTIL) (2.5 MG/3ML) 0.083% nebulizer solution TAKE 3 ML BY NEBULIZATION Q 6 hrs prn FOR WHEEZING OR SOB Dx J44.9 10/06/17   Enedina FinnerPatel, Sona, MD  atorvastatin (LIPITOR) 20  MG tablet TAKE 1 TABLET (20 MG TOTAL) BY MOUTH DAILY. 04/29/16   Daphine Deutscher, Mary-Margaret, FNP  busPIRone (BUSPAR) 5 MG tablet Take 5 mg by mouth 3 (three) times daily.    [provider]  methocarbamol (ROBAXIN) 750 MG tablet Take 1 tablet (750 mg total) by mouth every 8 (eight) hours as needed for muscle spasms. 05/01/19   Edward Jolly, MD  sertraline (ZOLOFT) 50 MG tablet Take 1 tablet (50 mg total) by mouth daily. 10/08/17   Enedina Finner, MD  traZODone (DESYREL) 100 MG tablet Take 1 tablet (100 mg total) by mouth at bedtime. 10/07/17    Enedina Finner, MD    Allergies Gabapentin, Mobic [meloxicam], Penicillins, and Sulfa antibiotics  Family History  Problem Relation Age of Onset  . Cancer Mother        beast    Social History Social History   Tobacco Use  . Smoking status: Current Every Day Smoker    Packs/day: 1.00    Years: 45.00    Pack years: 45.00    Types: Cigarettes  . Smokeless tobacco: Never Used  Substance Use Topics  . Alcohol use: Yes    Alcohol/week: 0.0 standard drinks    Comment: rare  . Drug use: No    Review of Systems  Constitutional: No fever/chills Eyes: No visual changes. ENT: No sore throat. Cardiovascular: Denies chest pain. Respiratory: Positive for cough, wheezing and shortness of breath. Gastrointestinal: No abdominal pain.  No nausea, no vomiting.  No diarrhea.  No constipation. Genitourinary: Negative for dysuria. Musculoskeletal: Negative for back pain. Skin: Negative for rash. Neurological: Negative for headaches, focal weakness or numbness.   ____________________________________________   PHYSICAL EXAM:  VITAL SIGNS: ED Triage Vitals  Enc Vitals Group     BP      Pulse      Resp      Temp      Temp src      SpO2      Weight      Height      Head Circumference      Peak Flow      Pain Score      Pain Loc      Pain Edu?      Excl. in GC?     Constitutional: Alert and oriented. Well appearing and in mild to moderate acute distress. Eyes: Conjunctivae are normal. PERRL. EOMI. Head: Atraumatic. Nose: No congestion/rhinnorhea. Mouth/Throat: Mucous membranes are moist.  Oropharynx non-erythematous. Neck: No stridor.   Cardiovascular: Normal rate, regular rhythm. Grossly normal heart sounds.  Good peripheral circulation. Respiratory: Increased respiratory effort.  No retractions. Lungs with audible wheezing.  Bibasilar rales. Gastrointestinal: Soft and nontender. No distention. No abdominal bruits. No CVA tenderness. Musculoskeletal: No lower extremity  tenderness nor edema.  No joint effusions. Neurologic:  Normal speech and language. No gross focal neurologic deficits are appreciated. No gait instability. Skin:  Skin is warm, dry and intact. No rash noted. Psychiatric: Mood and affect are normal. Speech and behavior are normal.  ____________________________________________   LABS (all labs ordered are listed, but only abnormal results are displayed)  Labs Reviewed  CBC WITH DIFFERENTIAL/PLATELET - Abnormal; Notable for the following components:      Result Value   Platelets 143 (*)    All other components within normal limits  COMPREHENSIVE METABOLIC PANEL - Abnormal; Notable for the following components:   Glucose, Bld 112 (*)    Calcium 8.5 (*)    Total Protein 6.4 (*)  Albumin 3.2 (*)    All other components within normal limits  SARS CORONAVIRUS 2 BY RT PCR (HOSPITAL ORDER, PERFORMED IN [facility])  BRAIN NATRIURETIC PEPTIDE  TROPONIN I (HIGH SENSITIVITY)   ____________________________________________  EKG  ED ECG REPORT I, SUNG,JADE J, the attending physician, personally viewed and interpreted this ECG.   Date: 06/10/2019  EKG Time: 2329  Rate: 84  Rhythm: normal EKG, normal sinus rhythm  Axis: RAD  Intervals:right bundle branch block  ST&T Change: Nonspecific  ____________________________________________  RADIOLOGY  ED MD interpretation: Interstitial edema  Official radiology report(s): Dg Chest Port 1 View  Result Date: 06/10/2019 CLINICAL DATA:  Shortness of breath worsening for 3 weeks EXAM: PORTABLE CHEST 1 VIEW COMPARISON:  Radiograph 10/02/2017, chest CT 09/04/2015 FINDINGS: There is a background of chronic bronchitic changes seen on prior studies. There is increasing hazy interstitial and reticular opacities throughout the lungs most prevalent in the lung bases with some cephalization and indistinctness of the pulmonary vascularity and few peripheral septal lines. No focal  consolidative process is clearly evident. No pneumothorax or effusion. The aorta is calcified. The remaining cardiomediastinal contours are unremarkable. No acute osseous or soft tissue abnormality. Degenerative changes are present in the imaged spine and shoulders. IMPRESSION: Appearances suggestive some degree of interstitial edema superimposed on chronic bronchitic changes present on prior studies. Electronically Signed   By: Kreg Shropshire M.D.   On: 06/10/2019 23:30    ____________________________________________   PROCEDURES  Procedure(s) performed (including Critical Care):  Procedures  CRITICAL CARE Performed by: Irean Hong   Total critical care time: 45 minutes  Critical care time was exclusive of separately billable procedures and treating other patients.  Critical care was necessary to treat or prevent imminent or life-threatening deterioration.  Critical care was time spent personally by me on the following activities: development of treatment plan with patient and/or surrogate as well as nursing, discussions with consultants, evaluation of patient's response to treatment, examination of patient, obtaining history from patient or surrogate, ordering and performing treatments and interventions, ordering and review of laboratory studies, ordering and review of radiographic studies, pulse oximetry and re-evaluation of patient's condition.  ____________________________________________   INITIAL IMPRESSION / ASSESSMENT AND PLAN / ED COURSE  As part of my medical decision making, I reviewed the following data within the electronic MEDICAL RECORD NUMBER Nursing notes reviewed and incorporated, Labs reviewed, EKG interpreted, Old chart reviewed, Radiograph reviewed, Discussed with admitting physician and Notes from prior ED visits     Bethany Villa was evaluated in Emergency Department on 06/11/2019 for the symptoms described in the history of present illness. She was evaluated in the  context of the global COVID-19 pandemic, which necessitated consideration that the patient might be at risk for infection with the SARS-CoV-2 virus that causes COVID-19. Institutional protocols and algorithms that pertain to the evaluation of patients at risk for COVID-19 are in a state of rapid change based on information released by regulatory bodies including the CDC and federal and state organizations. These policies and algorithms were followed during the patient's care in the ED.    63 year old female with COPD who presents with shortness of breath. Differential includes, but is not limited to, viral syndrome, bronchitis including COPD exacerbation, pneumonia, reactive airway disease including asthma, CHF including exacerbation with or without pulmonary/interstitial edema, pneumothorax, ACS, thoracic trauma, and pulmonary embolism.  Will administer DuoNeb.  Patient already had IV Solu-Medrol as well as magnesium.  On nasal cannula oxygen she is  98%.  Obtain basic labs, x-ray, COVID-19 swab.  Anticipate hospitalization.  Clinical Course as of Jun 11 151  Mon Jun 11, 2019  0047 Somewhat improved.  Still remains tachypneic at rest with slight audible wheezing.  Will give additional DuoNeb.  Updated patient on all test results.  Will discuss with hospitalist services to evaluate patient in emergency department for admission.   [JS]    Clinical Course User Index [JS] Paulette Blanch, MD     ____________________________________________   FINAL CLINICAL IMPRESSION(S) / ED DIAGNOSES  Final diagnoses:  COPD exacerbation ([place])  SOB (shortness of breath)     ED Discharge Orders    None       Note:  This document was prepared using Dragon voice recognition software and may include unintentional dictation errors.   Paulette Blanch, MD 06/11/19 [phone]

## 2019-06-11 ENCOUNTER — Inpatient Hospital Stay
Admit: 2019-06-11 | Discharge: 2019-06-11 | Disposition: A | Discharge status: Home / Self Care | Payer: Medicare Other | Attending: Family Medicine | Admitting: Family Medicine

## 2019-06-11 DIAGNOSIS — R0602 Shortness of breath: Secondary | ICD-10-CM | POA: Diagnosis present

## 2019-06-11 DIAGNOSIS — M797 Fibromyalgia: Secondary | ICD-10-CM | POA: Diagnosis present

## 2019-06-11 DIAGNOSIS — Z23 Encounter for immunization: Secondary | ICD-10-CM | POA: Diagnosis not present

## 2019-06-11 DIAGNOSIS — Z20828 Contact with and (suspected) exposure to other viral communicable diseases: Secondary | ICD-10-CM | POA: Diagnosis present

## 2019-06-11 DIAGNOSIS — R0902 Hypoxemia: Secondary | ICD-10-CM | POA: Diagnosis present

## 2019-06-11 DIAGNOSIS — I4510 Unspecified right bundle-branch block: Secondary | ICD-10-CM | POA: Diagnosis present

## 2019-06-11 DIAGNOSIS — Z888 Allergy status to other drugs, medicaments and biological substances status: Secondary | ICD-10-CM | POA: Diagnosis not present

## 2019-06-11 DIAGNOSIS — Z6830 Body mass index (BMI) 30.0-30.9, adult: Secondary | ICD-10-CM | POA: Diagnosis not present

## 2019-06-11 DIAGNOSIS — J209 Acute bronchitis, unspecified: Secondary | ICD-10-CM | POA: Diagnosis present

## 2019-06-11 DIAGNOSIS — F418 Other specified anxiety disorders: Secondary | ICD-10-CM | POA: Diagnosis present

## 2019-06-11 DIAGNOSIS — F410 Panic disorder [episodic paroxysmal anxiety] without agoraphobia: Secondary | ICD-10-CM | POA: Diagnosis present

## 2019-06-11 DIAGNOSIS — Z79899 Other long term (current) drug therapy: Secondary | ICD-10-CM | POA: Diagnosis not present

## 2019-06-11 DIAGNOSIS — K219 Gastro-esophageal reflux disease without esophagitis: Secondary | ICD-10-CM | POA: Diagnosis present

## 2019-06-11 DIAGNOSIS — Z886 Allergy status to analgesic agent status: Secondary | ICD-10-CM | POA: Diagnosis not present

## 2019-06-11 DIAGNOSIS — E785 Hyperlipidemia, unspecified: Secondary | ICD-10-CM | POA: Diagnosis present

## 2019-06-11 DIAGNOSIS — E669 Obesity, unspecified: Secondary | ICD-10-CM | POA: Diagnosis present

## 2019-06-11 DIAGNOSIS — G8929 Other chronic pain: Secondary | ICD-10-CM | POA: Diagnosis present

## 2019-06-11 DIAGNOSIS — Z880 Allergy status to penicillin: Secondary | ICD-10-CM | POA: Diagnosis not present

## 2019-06-11 DIAGNOSIS — Z882 Allergy status to sulfonamides status: Secondary | ICD-10-CM | POA: Diagnosis not present

## 2019-06-11 DIAGNOSIS — Z96652 Presence of left artificial knee joint: Secondary | ICD-10-CM | POA: Diagnosis present

## 2019-06-11 DIAGNOSIS — F17213 Nicotine dependence, cigarettes, with withdrawal: Secondary | ICD-10-CM | POA: Diagnosis present

## 2019-06-11 DIAGNOSIS — I5033 Acute on chronic diastolic (congestive) heart failure: Secondary | ICD-10-CM | POA: Diagnosis present

## 2019-06-11 DIAGNOSIS — Z803 Family history of malignant neoplasm of breast: Secondary | ICD-10-CM | POA: Diagnosis not present

## 2019-06-11 DIAGNOSIS — J440 Chronic obstructive pulmonary disease with acute lower respiratory infection: Secondary | ICD-10-CM | POA: Diagnosis present

## 2019-06-11 DIAGNOSIS — J441 Chronic obstructive pulmonary disease with (acute) exacerbation: Secondary | ICD-10-CM | POA: Diagnosis present

## 2019-06-11 LAB — COMPREHENSIVE METABOLIC PANEL
ALT: 19 U/L (ref 0–44)
ALT: 20 U/L (ref 0–44)
AST: 21 U/L (ref 15–41)
AST: 22 U/L (ref 15–41)
Albumin: 3.2 g/dL — ABNORMAL LOW (ref 3.5–5.0)
Albumin: 3.5 g/dL (ref 3.5–5.0)
Alkaline Phosphatase: 82 U/L (ref 38–126)
Alkaline Phosphatase: 87 U/L (ref 38–126)
Anion gap: 7 (ref 5–15)
Anion gap: 9 (ref 5–15)
BUN: 13 mg/dL (ref 8–23)
BUN: 15 mg/dL (ref 8–23)
CO2: 30 mmol/L (ref 22–32)
CO2: 31 mmol/L (ref 22–32)
Calcium: 8.4 mg/dL — ABNORMAL LOW (ref 8.9–10.3)
Calcium: 8.5 mg/dL — ABNORMAL LOW (ref 8.9–10.3)
Chloride: 101 mmol/L (ref 98–111)
Chloride: 99 mmol/L (ref 98–111)
Creatinine, Ser: 0.71 mg/dL (ref 0.44–1.00)
Creatinine, Ser: 0.73 mg/dL (ref 0.44–1.00)
GFR calc Af Amer: >60 mL/min (ref >60)
GFR calc Af Amer: >60 mL/min (ref >60)
GFR calc non Af Amer: >60 mL/min (ref >60)
GFR calc non Af Amer: >60 mL/min (ref >60)
Glucose, Bld: 112 mg/dL — ABNORMAL HIGH (ref 70–99)
Glucose, Bld: 152 mg/dL — ABNORMAL HIGH (ref 70–99)
Potassium: 3.5 mmol/L (ref 3.5–5.1)
Potassium: 4.7 mmol/L (ref 3.5–5.1)
Sodium: 138 mmol/L (ref 135–145)
Sodium: 139 mmol/L (ref 135–145)
Total Bilirubin: 0.6 mg/dL (ref 0.3–1.2)
Total Bilirubin: 0.6 mg/dL (ref 0.3–1.2)
Total Protein: 6.4 g/dL — ABNORMAL LOW (ref 6.5–8.1)
Total Protein: 7 g/dL (ref 6.5–8.1)

## 2019-06-11 LAB — CBC
HCT: 46 % (ref 36.0–46.0)
Hemoglobin: 14.1 g/dL (ref 12.0–15.0)
MCH: 28.3 pg (ref 26.0–34.0)
MCHC: 30.7 g/dL (ref 30.0–36.0)
MCV: 92.2 fL (ref 80.0–100.0)
Platelets: 148 10*3/uL — ABNORMAL LOW (ref 150–400)
RBC: 4.99 MIL/uL (ref 3.87–5.11)
RDW: 14.4 % (ref 11.5–15.5)
WBC: 4.9 10*3/uL (ref 4.0–10.5)
nRBC: 0 % (ref 0.0–0.2)

## 2019-06-11 LAB — TROPONIN I (HIGH SENSITIVITY)
Troponin I (High Sensitivity): 13 ng/L (ref <18)
Troponin I (High Sensitivity): 6 ng/L (ref <18)
Troponin I (High Sensitivity): 8 ng/L (ref <18)

## 2019-06-11 LAB — SARS CORONAVIRUS 2 BY RT PCR (HOSPITAL ORDER, PERFORMED IN ~~LOC~~ HOSPITAL LAB): SARS Coronavirus 2: NEGATIVE

## 2019-06-11 LAB — BRAIN NATRIURETIC PEPTIDE: B Natriuretic Peptide: 357 pg/mL — ABNORMAL HIGH (ref 0.0–100.0)

## 2019-06-11 LAB — MRSA PCR SCREENING: MRSA by PCR: POSITIVE — AB

## 2019-06-11 LAB — HEMOGLOBIN A1C
Hgb A1c MFr Bld: 5.4 % (ref 4.8–5.6)
Mean Plasma Glucose: 108.28 mg/dL

## 2019-06-11 MED ORDER — ACETAMINOPHEN 325 MG PO TABS
650.0000 mg | ORAL_TABLET | Freq: Four times a day (QID) | ORAL | Status: DC | PRN
  Administered 2019-06-12: 650 mg via ORAL
  Filled 2019-06-11: qty 2

## 2019-06-11 MED ORDER — TRAZODONE HCL 100 MG PO TABS: 100.0000 mg | ORAL_TABLET | Freq: Every day | ORAL | Status: DC

## 2019-06-11 MED ORDER — GUAIFENESIN ER 600 MG PO TB12
600.0000 mg | ORAL_TABLET | Freq: Two times a day (BID) | ORAL | Status: DC
  Administered 2019-06-11 – 2019-06-13 (×5): 600 mg via ORAL
  Filled 2019-06-11 (×5): qty 1

## 2019-06-11 MED ORDER — SODIUM CHLORIDE 0.9 % IV SOLN: 500.0000 mg | INTRAVENOUS | Status: DC

## 2019-06-11 MED ORDER — ONDANSETRON HCL 4 MG/2ML IJ SOLN: 4.0000 mg | Freq: Four times a day (QID) | INTRAMUSCULAR | Status: DC | PRN

## 2019-06-11 MED ORDER — MAGNESIUM HYDROXIDE 400 MG/5ML PO SUSP: 30.0000 mL | Freq: Every day | ORAL | Status: DC | PRN

## 2019-06-11 MED ORDER — POTASSIUM CHLORIDE 20 MEQ PO PACK
40.0000 meq | PACK | Freq: Once | ORAL | Status: AC
  Administered 2019-06-11: 40 meq via ORAL
  Filled 2019-06-11: qty 2

## 2019-06-11 MED ORDER — BUSPIRONE HCL 5 MG PO TABS
5.0000 mg | ORAL_TABLET | Freq: Three times a day (TID) | ORAL | Status: DC
  Administered 2019-06-11 – 2019-06-13 (×7): 5 mg via ORAL
  Filled 2019-06-11 (×10): qty 1

## 2019-06-11 MED ORDER — SERTRALINE HCL 50 MG PO TABS
50.0000 mg | ORAL_TABLET | Freq: Every day | ORAL | Status: DC
  Administered 2019-06-11 – 2019-06-13 (×3): 50 mg via ORAL
  Filled 2019-06-11 (×3): qty 1

## 2019-06-11 MED ORDER — METHYLPREDNISOLONE SODIUM SUCC 125 MG IJ SOLR
60.0000 mg | Freq: Three times a day (TID) | INTRAMUSCULAR | Status: AC
  Administered 2019-06-11 (×3): 60 mg via INTRAVENOUS
  Filled 2019-06-11 (×3): qty 2

## 2019-06-11 MED ORDER — IPRATROPIUM-ALBUTEROL 0.5-2.5 (3) MG/3ML IN SOLN
3.0000 mL | Freq: Four times a day (QID) | RESPIRATORY_TRACT | Status: DC
  Administered 2019-06-11 – 2019-06-12 (×5): 3 mL via RESPIRATORY_TRACT
  Filled 2019-06-11 (×4): qty 3

## 2019-06-11 MED ORDER — ATORVASTATIN CALCIUM 20 MG PO TABS
20.0000 mg | ORAL_TABLET | Freq: Every day | ORAL | Status: DC
  Administered 2019-06-11 – 2019-06-12 (×2): 20 mg via ORAL
  Filled 2019-06-11 (×2): qty 1

## 2019-06-11 MED ORDER — FUROSEMIDE 10 MG/ML IJ SOLN
20.0000 mg | Freq: Once | INTRAMUSCULAR | Status: AC
  Administered 2019-06-11: 20 mg via INTRAVENOUS
  Filled 2019-06-11: qty 4

## 2019-06-11 MED ORDER — IPRATROPIUM-ALBUTEROL 0.5-2.5 (3) MG/3ML IN SOLN
RESPIRATORY_TRACT | Status: AC
  Filled 2019-06-11: qty 3

## 2019-06-11 MED ORDER — FUROSEMIDE 10 MG/ML IJ SOLN
40.0000 mg | Freq: Two times a day (BID) | INTRAMUSCULAR | Status: DC
  Administered 2019-06-11 – 2019-06-13 (×5): 40 mg via INTRAVENOUS
  Filled 2019-06-11 (×5): qty 4

## 2019-06-11 MED ORDER — LEVOFLOXACIN IN D5W 750 MG/150ML IV SOLN: 750.0000 mg | INTRAVENOUS | Status: DC

## 2019-06-11 MED ORDER — ACETAMINOPHEN 650 MG RE SUPP: 650.0000 mg | Freq: Four times a day (QID) | RECTAL | Status: DC | PRN

## 2019-06-11 MED ORDER — METHOCARBAMOL 750 MG PO TABS
750.0000 mg | ORAL_TABLET | Freq: Three times a day (TID) | ORAL | Status: DC | PRN
  Administered 2019-06-11 – 2019-06-12 (×3): 750 mg via ORAL
  Filled 2019-06-11 (×5): qty 1

## 2019-06-11 MED ORDER — HYDROCOD POLST-CPM POLST ER 10-8 MG/5ML PO SUER
5.0000 mL | Freq: Once | ORAL | Status: AC
  Administered 2019-06-11: 5 mL via ORAL
  Filled 2019-06-11: qty 5

## 2019-06-11 MED ORDER — NICOTINE 21 MG/24HR TD PT24
21.0000 mg | MEDICATED_PATCH | Freq: Every day | TRANSDERMAL | Status: DC
  Administered 2019-06-11 – 2019-06-13 (×3): 21 mg via TRANSDERMAL
  Filled 2019-06-11 (×3): qty 1

## 2019-06-11 MED ORDER — IPRATROPIUM-ALBUTEROL 0.5-2.5 (3) MG/3ML IN SOLN
3.0000 mL | Freq: Once | RESPIRATORY_TRACT | Status: AC
  Administered 2019-06-11: 3 mL via RESPIRATORY_TRACT

## 2019-06-11 MED ORDER — TRAZODONE HCL 100 MG PO TABS
100.0000 mg | ORAL_TABLET | Freq: Every day | ORAL | Status: DC
  Administered 2019-06-11 – 2019-06-12 (×3): 100 mg via ORAL
  Filled 2019-06-11 (×3): qty 1

## 2019-06-11 MED ORDER — LEVOFLOXACIN IN D5W 500 MG/100ML IV SOLN
500.0000 mg | INTRAVENOUS | Status: DC
  Administered 2019-06-11 – 2019-06-13 (×3): 500 mg via INTRAVENOUS
  Filled 2019-06-11 (×3): qty 100

## 2019-06-11 MED ORDER — GUAIFENESIN-DM 100-10 MG/5ML PO SYRP
5.0000 mL | ORAL_SOLUTION | ORAL | Status: DC | PRN
  Administered 2019-06-11 – 2019-06-13 (×6): 5 mL via ORAL
  Filled 2019-06-11 (×7): qty 5

## 2019-06-11 MED ORDER — ENSURE ENLIVE PO LIQD
237.0000 mL | Freq: Two times a day (BID) | ORAL | Status: DC
  Administered 2019-06-12 – 2019-06-13 (×2): 237 mL via ORAL

## 2019-06-11 MED ORDER — ENOXAPARIN SODIUM 40 MG/0.4ML ~~LOC~~ SOLN
40.0000 mg | SUBCUTANEOUS | Status: DC
  Administered 2019-06-12 – 2019-06-13 (×2): 40 mg via SUBCUTANEOUS
  Filled 2019-06-11 (×2): qty 0.4

## 2019-06-11 MED ORDER — PREDNISONE 20 MG PO TABS
40.0000 mg | ORAL_TABLET | Freq: Every day | ORAL | Status: DC
  Administered 2019-06-12 – 2019-06-13 (×2): 40 mg via ORAL
  Filled 2019-06-11 (×2): qty 2

## 2019-06-11 MED ORDER — ONDANSETRON HCL 4 MG PO TABS: 4.0000 mg | ORAL_TABLET | Freq: Four times a day (QID) | ORAL | Status: DC | PRN

## 2019-06-11 MED ORDER — SODIUM CHLORIDE 0.9 % IV SOLN: 100.0000 mg | Freq: Two times a day (BID) | INTRAVENOUS | Status: DC

## 2019-06-11 NOTE — ED Notes (Signed)
Lab called for blood draw for cultures and repeat troponin.

## 2019-06-11 NOTE — H&P (Addendum)
[place] [facility] [place] NAME: Bethany Villa    MR#:  [id number]  DATE OF BIRTH:  [birth year]  DATE OF ADMISSION:  06/10/2019  PRIMARY CARE PHYSICIAN: Marinda Elk, MD   REQUESTING/REFERRING PHYSICIAN: Lurline Hare, MD CHIEF COMPLAINT:   Chief Complaint  Patient presents with  . Shortness of Breath    HISTORY OF PRESENT ILLNESS:  Bethany Villa  is a 63 y.o. Caucasian female with a known history of COPD/asthma, anxiety/depression and fibromyalgia, presented to the emergency room with acute onset of dyspnea with associated cough productive of yellowish sputum as well as wheezing which have been going on over the last 3 weeks and significantly worsening over the last week.  She admitted to tactile fever but diaphoresis without chills. She has also been having 2 pillow orthopnea as well as mild lower extremity edema, paroxysmal nocturnal dyspnea and dyspnea on exertion.   No nausea or vomiting or abdominal pain.  She denied any chest pain or palpitations.   No rhinorrhea or nasal congestion or sore throat.  No COVID-19 exposures.  No dysuria, oliguria or hematuria or flank pain.  Upon presentation to the emergency room, blood pressure was 142/91 with a pulse of 24 and pulse oximetry 98% on 2 L O2 nasal cannula with a temperature of 98.3.  Labs revealed potassium of 3.5 blood glucose of 112 with CO2 30 and BNP of 357 with high-sensitivity troponin I of 13 and CBC was unremarkable.  Her COVID-19 test came back negative.  Portable chest ray showed some degree of interstitial edema superimposed on chronic bronchitic changes.  EKG showed normal sinus rhythm rate of 84 with right bundle branch block and inferior Q waves.  The patient was given 2 g of IV magnesium sulfate by EMS as well as 125 mg IV Solu-Medrol.  Here in the ER she was given 20 mg of IV Lasix and DuoNeb's times.  She would be admitted to a medically monitored bed for further evaluation and  management. PAST MEDICAL HISTORY:   Past Medical History:  Diagnosis Date  . Anxiety   . Asthma   . Chronic pain    lower back  . COPD (chronic obstructive pulmonary disease) ([place])   . Depression   . DJD (degenerative joint disease)   . Fibromyalgia   . GERD (gastroesophageal reflux disease)   . Headache   . Hyperlipidemia   . Lower extremity edema   . On home oxygen therapy    has not been on since 2016  . Panic attack   . Shortness of breath dyspnea   . Sleep disorder     PAST SURGICAL HISTORY:   Past Surgical History:  Procedure Laterality Date  . ABDOMINAL SURGERY    . ANKLE SURGERY    . BACK SURGERY     lower  . CARPAL TUNNEL RELEASE    . CESAREAN SECTION    . KNEE SURGERY    . TONSILLECTOMY    . TOTAL KNEE ARTHROPLASTY Left 04/14/2016   Procedure: TOTAL KNEE ARTHROPLASTY;  Surgeon: Earnestine Leys, MD;  Location: [facility] ORS;  Service: Orthopedics;  Laterality: Left;  . TUBAL LIGATION      SOCIAL HISTORY:   Social History   Tobacco Use  . Smoking status: Current Every Day Smoker    Packs/day: 1.00    Years: 45.00    Pack years: 45.00    Types: Cigarettes  . Smokeless tobacco: Never Used  Substance Use Topics  .  Alcohol use: Yes    Alcohol/week: 0.0 standard drinks    Comment: rare    FAMILY HISTORY:   Family History  Problem Relation Age of Onset  . Cancer Mother        beast    DRUG ALLERGIES:   Allergies  Allergen Reactions  . Gabapentin Shortness Of Breath and Rash  . Mobic [Meloxicam] Anaphylaxis  . Penicillins Shortness Of Breath and Swelling    Has patient had a PCN reaction causing immediate rash, facial/tongue/throat swelling, SOB or lightheadedness with hypotension: unknown Has patient had a PCN reaction causing severe rash involving mucus membranes or skin necrosis: {unknown Has patient had a PCN reaction that required hospitalization {unknown Has patient had a PCN reaction occurring within the last 10 years: no If all of the  above answers are "NO", then may proceed with Cephalosporin use.  . Sulfa Antibiotics Shortness Of Breath and Swelling    REVIEW OF SYSTEMS:   ROS As per history of present illness. All pertinent systems were reviewed above. Constitutional,  HEENT, cardiovascular, respiratory, GI, GU, musculoskeletal, neuro, psychiatric, endocrine,  integumentary and hematologic systems were reviewed and are otherwise  negative/unremarkable except for positive findings mentioned above in the HPI.   MEDICATIONS AT HOME:   Prior to Admission medications   Medication Sig Start Date End Date Taking? Authorizing Provider  albuterol (PROVENTIL) (2.5 MG/3ML) 0.083% nebulizer solution TAKE 3 ML BY NEBULIZATION Q 6 hrs prn FOR WHEEZING OR SOB Dx J44.9 10/06/17   Enedina Finner, MD  atorvastatin (LIPITOR) 20 MG tablet TAKE 1 TABLET (20 MG TOTAL) BY MOUTH DAILY. 04/29/16   Daphine Deutscher, Mary-Margaret, FNP  busPIRone (BUSPAR) 5 MG tablet Take 5 mg by mouth 3 (three) times daily.    [provider]  methocarbamol (ROBAXIN) 750 MG tablet Take 1 tablet (750 mg total) by mouth every 8 (eight) hours as needed for muscle spasms. 05/01/19   Edward Jolly, MD  sertraline (ZOLOFT) 50 MG tablet Take 1 tablet (50 mg total) by mouth daily. 10/08/17   Enedina Finner, MD  traZODone (DESYREL) 100 MG tablet Take 1 tablet (100 mg total) by mouth at bedtime. 10/07/17   Enedina Finner, MD      VITAL SIGNS:  Blood pressure (!) 142/91, pulse 86, temperature 98.3 F (36.8 C), resp. rate (!) 24, height 5' (1.524 m), weight 77.1 kg, SpO2 98 %.  PHYSICAL EXAMINATION:  Physical Exam  GENERAL:  63 y.o.-year-old Caucasian female patient lying in the bed in mild respiratory distress with conversational dyspnea. EYES: Pupils equal, round, reactive to light and accommodation. No scleral icterus. Extraocular muscles intact.  HEENT: Head atraumatic, normocephalic. Oropharynx and nasopharynx clear.  NECK:  Supple, no jugular venous distention. No thyroid  enlargement, no tenderness.  LUNGS:  CARDIOVASCULAR: Regular rate and rhythm, S1, S2 normal. No murmurs, rubs, or gallops.  ABDOMEN: Soft, nondistended, nontender. Bowel sounds present. No organomegaly or mass.  EXTREMITIES: No pedal edema, cyanosis, or clubbing.  NEUROLOGIC: Cranial nerves II through XII are intact. Muscle strength 5/5 in all extremities. Sensation intact. Gait not checked.  PSYCHIATRIC: The patient is alert and oriented x 3.  Normal affect and good eye contact. SKIN: No obvious rash, lesion, or ulcer.   LABORATORY PANEL:   CBC Recent Labs  Lab 06/10/19 2331  WBC 7.2  HGB 13.8  HCT 44.6  PLT 143*   ------------------------------------------------------------------------------------------------------------------  Chemistries  Recent Labs  Lab 06/10/19 2331  NA 138  K 3.5  CL 101  CO2 30  GLUCOSE 112*  BUN 15  CREATININE 0.71  CALCIUM 8.5*  AST 21  ALT 19  ALKPHOS 82  BILITOT 0.6   ------------------------------------------------------------------------------------------------------------------  Cardiac Enzymes No results for input(s): TROPONINI in the last 168 hours. ------------------------------------------------------------------------------------------------------------------  RADIOLOGY:  Dg Chest Port 1 View  Result Date: 06/10/2019 CLINICAL DATA:  Shortness of breath worsening for 3 weeks EXAM: PORTABLE CHEST 1 VIEW COMPARISON:  Radiograph 10/02/2017, chest CT 09/04/2015 FINDINGS: There is a background of chronic bronchitic changes seen on prior studies. There is increasing hazy interstitial and reticular opacities throughout the lungs most prevalent in the lung bases with some cephalization and indistinctness of the pulmonary vascularity and few peripheral septal lines. No focal consolidative process is clearly evident. No pneumothorax or effusion. The aorta is calcified. The remaining cardiomediastinal contours are unremarkable. No acute  osseous or soft tissue abnormality. Degenerative changes are present in the imaged spine and shoulders. IMPRESSION: Appearances suggestive some degree of interstitial edema superimposed on chronic bronchitic changes present on prior studies. Electronically Signed   By: Kreg ShropshirePrice  DeHay M.D.   On: 06/10/2019 23:30      IMPRESSION AND PLAN:   1.  COPD acute exacerbation likely secondary to acute bronchitis. The patient will be admitted to a medically monitored bed and will be placed on IV steroid therapy with IV Solu-Medrol as well as nebulized bronchodilator therapy with duonebs q.i.d. and q.4 hours p.r.n., mucolytic therapy with Mucinex and antibiotic therapy with IV Levaquin.  Sputum Gram stain culture and sensitivity will be obtained.  O2 protocol will be followed.  2.  Acute on top of chronic diastolic CHF. The patient will be admitted to a telemetry bed and will be diuresed with IV Lasix.  Will follow serial cardiac enzymes.  Will obtain a 2D echo(if not done last 6 months) and a cardiology consultation in a.m. Dr. Darrold JunkerParaschos consulted about the patient.  I notified him.  Last EF was 55 to 60% in February 2019.  3.  Ongoing tobacco abuse.I counseled the patient for smoking cessation and the patient will receive further counseling here.  She will have NicoDerm patch here for nicotine withdrawal.  4.  Anxiety and depression.  We will continue Zoloft and BuSpar, trazodone as well as PRN Xanax.  5.  Dyslipidemia.  Statin therapy will be resumed.  6.  DVT prophylaxis.  Subcutaneous Lovenox and GI prophylaxis with p.o. Pepcid given current steroid therapy.    All the records are reviewed and case discussed with ED provider. The plan of care was discussed in details with the patient (and family). I answered all questions. The patient agreed to proceed with the above mentioned plan. Further management will depend upon hospital course.   CODE STATUS: Full code  TOTAL TIME TAKING CARE OF THIS  PATIENT: 50 minutes.    Hannah BeatJan A Mansy M.D on 06/11/2019 at 1:06 AM  Pager - [phone]  After 6pm go to [email or web address] - Social research officer, governmentpassword EPAS [facility]  [facility]  Office  [phone]  CC: Primary care physician; Patrice ParadiseMcLaughlin, Miriam K, MD   Note: This dictation was prepared with Dragon dictation along with smaller phrase technology. Any transcriptional errors that result from this process are unintentional.

## 2019-06-11 NOTE — Progress Notes (Signed)
Initial Nutrition Assessment   RD working remotely.   DOCUMENTATION CODES:   Not applicable  INTERVENTION:   Ensure Enlive po BID, each supplement provides 350 kcal and 20 grams of protein   NUTRITION DIAGNOSIS:   Inadequate oral intake related to decreased appetite as evidenced by per patient/family report.  GOAL:   Patient will meet greater than or equal to 90% of their needs  MONITOR:   PO intake, Supplement acceptance, Labs, Weight trends  REASON FOR ASSESSMENT:   Consult COPD Protocol  ASSESSMENT:   63 yo female admitted with acute on chronic COPD exacerbation, acute on chronic CHF. PMH includes anxiety/depression, COPD, HLD, GERD  No recorded po intake. Pt reports she did not eat much lunch today because she did not like the food she received. Pt reports she is going to pick something better for lunch today. Pt reports appetite has not been great, open to trying an oral nutrition supplement.   Pt reports she is 5 foot 3 inches tall and believes she has lost weight but does not know how much weight or what her UBW is. Pt indicates her weight fluctuates.  Current wt 77.1 kg; per weight encounters, pt weighed 81.6 kg 1 year ago. 5.5 % wt loss in 1 year  Pt reports sometimes she has problems chewing swallowing/chewing. Encouraged pt to take small bites and take sips of liquid after each bite. If mastication is difficult with her breathing issues, encouraged pt to choose softer foods. If swallowing, issues continue, enocuraged pt to discuss with MD  Labs: reviewed Meds: lasix, solumedrol  NUTRITION - FOCUSED PHYSICAL EXAM:  Unable to assess, working remotely  Diet Order:   Diet Order            Diet Heart Room service appropriate? Yes; Fluid consistency: Thin  Diet effective now              EDUCATION NEEDS:   Education needs have been addressed  Skin:  Skin Assessment: Reviewed RN Assessment  Last BM:  10/12  Height:   Ht Readings from Last 1  Encounters:  06/11/19 5\' 3"  (1.6 m)    Weight:   Wt Readings from Last 1 Encounters:  06/10/19 77.1 kg    Ideal Body Weight:  52.2 kg  BMI:  Body mass index is 30.11 kg/m.  Estimated Nutritional Needs:   Kcal:  1700-1900 kcals  Protein:  85-95 g  Fluid:  >/=1.7 L   Kerman Passey MS, RDN, LDN, CNSC [phone] Pager  [phone] Weekend/On-Call Pager

## 2019-06-11 NOTE — Progress Notes (Signed)
Same day rounding progress note  She is requesting nicotine patch, still struggling to breathe  1.  COPD acute exacerbation likely secondary to acute bronchitis -Continue IV Solu-Medrol as well as nebulized bronchodilator therapy with duonebs q.i.d. and q.4 hours p.r.n., mucolytic therapy with Mucinex and antibiotic therapy with IV Levaquin. Sputum culture sensitivity pendingO2 as needed  2.  Acute on top of chronic diastolic CHF -Continue IV Lasix. Negative serial cardiac enzymes. Pending 2D echo, appreciate cardiology input. Last EF was 55 to 60% in February 2019.  3.  Ongoing tobacco abuse-counseled the patient for smoking cessation and the patient will receive further counseling here. NicoDerm patch here for nicotine withdrawal.  4.  Anxiety and depression - continue Zoloft and BuSpar, trazodone as well as PRN Xanax.  5.  Dyslipidemia.  Statin therapy   6.  DVT prophylaxis.  Subcutaneous Lovenox and GI prophylaxis with p.o. Pepcid given current steroid therapy  Time spent -15 minutes

## 2019-06-11 NOTE — Progress Notes (Signed)
*  PRELIMINARY RESULTS* Echocardiogram 2D Echocardiogram has been performed.  Bethany Villa 06/11/2019, 9:02 AM

## 2019-06-11 NOTE — Evaluation (Signed)
Occupational Therapy Evaluation Patient Details Name: Bethany Villa MRN: [id number] DOB: 06/25/1956 Today's Date: 06/11/2019    History of Present Illness Per MD: Pt is a 63 y.o. Caucasian female with a known history of COPD/asthma, anxiety/depression and fibromyalgia, presented to the emergency room with acute onset of dyspnea with associated cough productive of yellowish sputum as well as wheezing which have been going on over the last 3 weeks and significantly worsening over the last week.  She admitted to tactile fever but diaphoresis without chills. She has also been having 2 pillow orthopnea as well as mild lower extremity edema, paroxysmal nocturnal dyspnea and dyspnea on exertion. 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   Clinical Impression   Pt seen for OT evaluation this date. Prior to hospital admission, pt was independent and living by herself. Currently pt demonstrates impairments in activity tolerance and BLE Strength, with SOB noted with ambulating to/from and performing toileting task with modified independence. Cues for pursed lip breathing and breath recovery strategies once returned from bathroom with pt able to return demo proper technique. O2 after bathroom on RA 85%, increasing quickly to 91% with 2L supplemental O2 and cues for PLB. Pt currently near baseline, but demo's SOB with efforts. Pt would benefit from skilled OT to address noted impairments and functional limitations (see below for any additional details) in order to maximize safety and independence while minimizing falls risk and caregiver burden.  Upon hospital discharge, recommend pt discharge to home. Do not anticipate need for skilled OT Services beyond this hospitalization.     Follow Up Recommendations  No OT follow up    Equipment Recommendations  None recommended by OT    Recommendations for Other Services       Precautions / Restrictions  Precautions Precautions: Other (comment) Precaution Comments: watch O2 sats with exertional activity Restrictions Weight Bearing Restrictions: No      Mobility Bed Mobility Overal bed mobility: Independent                Transfers Overall transfer level: Needs assistance Equipment used: None Transfers: Sit to/from Stand Sit to Stand: Supervision         General transfer comment: Started with RW and CGA initially, and move to supervision only for safety.    Balance Overall balance assessment: Mild deficits observed, not formally tested                                         ADL either performed or assessed with clinical judgement   ADL Overall ADL's : Modified independent                                       General ADL Comments: Pt with increased effort/slight SOB with effort; cues for PLB to support breath recovery     Vision Patient Visual Report: No change from baseline       Perception     Praxis      Pertinent Vitals/Pain Pain Assessment: No/denies pain Pain Score: 8  Pain Location: Pain scale was converted into a "SOB scale". Pt report feeling SOB of 4/10 at rest, and 8/10 after a medium walk (100 ft) Pain Descriptors / Indicators: Tightness Pain Intervention(s): Limited activity within patient's tolerance;Monitored during session  Hand Dominance Right   Extremity/Trunk Assessment Upper Extremity Assessment Upper Extremity Assessment: Overall WFL for tasks assessed   Lower Extremity Assessment Lower Extremity Assessment: Generalized weakness   Cervical / Trunk Assessment Cervical / Trunk Assessment: Kyphotic   Communication Communication Communication: No difficulties   Cognition Arousal/Alertness: Awake/alert Behavior During Therapy: WFL for tasks assessed/performed Overall Cognitive Status: Within Functional Limits for tasks assessed                                     General  Comments       Exercises Other Exercises Other Exercises: Pt instructed in pursed lip breathing and breath recovery following and during exertional activities to minimize SOB   Shoulder Instructions      Home Living Family/patient expects to be discharged to:: Private residence Living Arrangements: Alone Available Help at Discharge: Friend(s);Available PRN/intermittently Type of Home: Apartment Home Access: Stairs to enter [employer] of Steps: 1/Threshold Entrance Stairs-Rails: None Home Layout: One level     Bathroom Shower/Tub: Teacher, early years/pre: Standard Bathroom Accessibility: Yes   Home Equipment: Shower seat          Prior Functioning/Environment Level of Independence: Independent        Comments: Pt was Ind in ADLs/IADLs, and prior to worsening condition of the last 3 weeks, pt was able ambulate comminuty distances with needing to take breaks. 1 fall in the last year due to a fairly high-level situation involving her dog and stepping from an uneven, grassy surface, to a raised, paived surface..        OT Problem List: Decreased strength;Decreased activity tolerance;Cardiopulmonary status limiting activity      OT Treatment/Interventions: Self-care/ADL training;Therapeutic exercise;Therapeutic activities;Energy conservation;DME and/or AE instruction;Patient/family education    OT Goals(Current goals can be found in the care plan section) Acute Rehab OT Goals Patient Stated Goal: be independent and not SOB OT Goal Formulation: With patient Time For Goal Achievement: 06/25/19 Potential to Achieve Goals: Good ADL Goals Pt Will Transfer to Toilet: with modified independence;ambulating;regular height toilet(with O2 sats >90% on room air.) Additional ADL Goal #1: Pt will verbalize plan to implement at least 1 learned energy conservation strategy into daily ADL routine.  OT Frequency: Min 1X/week   Barriers to D/C:             Co-evaluation              AM-PAC OT "6 Clicks" Daily Activity     Outcome Measure Help from another person eating meals?: None Help from another person taking care of personal grooming?: None Help from another person toileting, which includes using toliet, bedpan, or urinal?: None Help from another person bathing (including washing, rinsing, drying)?: A Little Help from another person to put on and taking off regular upper body clothing?: None Help from another person to put on and taking off regular lower body clothing?: A Little 6 Click Score: 22   End of Session    Activity Tolerance: Patient tolerated treatment well Patient left: in bed;with call bell/phone within reach  OT Visit Diagnosis: Other abnormalities of gait and mobility (R26.89);History of falling (Z91.81);Muscle weakness (generalized) (M62.81)                Time: 4098-1191 OT Time Calculation (min): 12 min Charges:  OT General Charges $OT Visit: 1 Visit OT Evaluation $OT Eval Low Complexity: 1 Low  Jeni Salles,  MPH, MS, OTR/L ascom [phone] 06/11/19, 1:52 PM

## 2019-06-11 NOTE — ED Notes (Signed)
Per [facility], pt going to telemetry.

## 2019-06-11 NOTE — Progress Notes (Signed)
OT Cancellation Note  Patient Details Name: Bethany Villa MRN: [id number] DOB: 10/06/1955   Cancelled Treatment:    Reason Eval/Treat Not Completed: Other (comment). Consult received, chart reviewed. Pt receiving breathing treatment upon attempt. Will re-attempt OT evaluation at later date/time as pt is available and medically appropriate.   Jeni Salles, MPH, MS, OTR/L ascom [phone] 06/11/19, 11:03 AM

## 2019-06-11 NOTE — Evaluation (Signed)
Physical Therapy Evaluation Patient Details Name: Bethany Villa MRN: [id number] DOB: 1956-02-23 Today's Date: 06/11/2019   History of Present Illness  Per MD: Pt is a 63 y.o. Caucasian female with a known history of COPD/asthma, anxiety/depression and fibromyalgia, presented to the emergency room with acute onset of dyspnea with associated cough productive of yellowish sputum as well as wheezing which have been going on over the last 3 weeks and significantly worsening over the last week.  She admitted to tactile fever but diaphoresis without chills. She has also been having 2 pillow orthopnea as well as mild lower extremity edema, paroxysmal nocturnal dyspnea and dyspnea on exertion. 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  Clinical Impression  Pt presented with deficits in strength, gait, balance, and activity tolerance. Pt initially asked to rate her SOB on a 10-point scale, pt reported a 4/10 at rest and an 8/10 after completing 2 walks, about 150 ft total (50x1 + 100x1), pt returned to baseline in 2 minutes or less. Vitals were monitored with pt on 2LO2 throughout, SpO2 was 94-97% and HR was [facility]. Pt completed bed exercises and mobility independently. Pt completed sit<>stand transfers and ambulation along one corner of the unit and back with RW and supervision for safety, and in-room with no AD and supervision from bed to recliner. Pt will benefit from HHPT services upon discharge to safely address above deficits for decreased caregiver assistance and eventual return to PLOF.     Follow Up Recommendations Home health PT    Equipment Recommendations  None recommended by PT    Recommendations for Other Services       Precautions / Restrictions Precautions Precautions: Other (comment) Precaution Comments: watch O2 sats with exertional activity Restrictions Weight Bearing Restrictions: No      Mobility  Bed Mobility Overal bed mobility:  Independent                Transfers Overall transfer level: Needs assistance Equipment used: None Transfers: Sit to/from Stand Sit to Stand: Supervision         General transfer comment: Started with RW and CGA initially, and move to supervision only for safety.  Ambulation/Gait Ambulation/Gait assistance: Supervision Gait Distance (Feet): 100 Feet x1, 24ft x1 Assistive device: Rolling walker (2 wheeled) Gait Pattern/deviations: Step-through pattern;Decreased step length - right;Decreased step length - left Gait velocity: Decreased   General Gait Details: Started with RW and CGA initially, and move to supervision only for safety.  Stairs            Wheelchair Mobility    Modified Rankin (Stroke Patients Only)       Balance Overall balance assessment: Mild deficits observed, not formally tested                                           Pertinent Vitals/Pain Pain Assessment: No/denies pain Pain Score: 8  Pain Location: Pain scale was converted into a "SOB scale". Pt report feeling SOB of 4/10 at rest, and 8/10 after a medium walk (100 ft) Pain Descriptors / Indicators: Tightness Pain Intervention(s): Limited activity within patient's tolerance;Monitored during session    Home Living Family/patient expects to be discharged to:: Private residence Living Arrangements: Alone Available Help at Discharge: Friend(s);Available PRN/intermittently Type of Home: Apartment Home Access: Stairs to enter Entrance Stairs-Rails: None Entrance Stairs-Number of Steps: 1/Threshold Home Layout: One level  Home Equipment: Shower seat      Prior Function Level of Independence: Independent         Comments: Pt was Ind in ADLs/IADLs, and prior to worsening condition of the last 3 weeks, pt was able ambulate comminuty distances without needing to take breaks. 1 fall in the last year due to a fairly high-level situation involving her dog and stepping from  an uneven, grassy surface, to a raised, paived surface..     Hand Dominance   Dominant Hand: Right    Extremity/Trunk Assessment   Upper Extremity Assessment Upper Extremity Assessment: Overall WFL for tasks assessed    Lower Extremity Assessment Lower Extremity Assessment: Generalized weakness    Cervical / Trunk Assessment Cervical / Trunk Assessment: Kyphotic  Communication   Communication: No difficulties  Cognition Arousal/Alertness: Awake/alert Behavior During Therapy: WFL for tasks assessed/performed Overall Cognitive Status: Within Functional Limits for tasks assessed                                        General Comments      Exercises Total Joint Exercises Ankle Circles/Pumps: AROM;Strengthening;Both;10 reps;15 reps Quad Sets: AROM;Strengthening;Both;10 reps;15 reps Straight Leg Raises: AROM;Strengthening;Both;10 reps;15 reps Long Arc Quad: AROM;Strengthening;Both;10 reps Knee Flexion: AROM;Strengthening;Both;10 reps Marching in Standing: Strengthening;Both;5 reps Other Exercises Other Exercises: Pt educated on the importance of sitting up in chair to help clear secretions, also smoking cessation, and regular activity to maintain function despite COPD. Other Exercises: Pt instructed in pursed lip breathing and breath recovery following and during exertional activities to minimize SOB   Assessment/Plan    PT Assessment Patient needs continued PT services  PT Problem List Decreased activity tolerance;Decreased balance;Decreased strength       PT Treatment Interventions Stair training;Therapeutic activities;Functional mobility training;Therapeutic exercise;Patient/family education;Balance training    PT Goals (Current goals can be found in the Care Plan section)  Acute Rehab PT Goals Patient Stated Goal: be independent and not SOB PT Goal Formulation: With patient Time For Goal Achievement: 06/22/19 Potential to Achieve Goals: Good     Frequency Min 2X/week   Barriers to discharge        Co-evaluation               AM-PAC PT "6 Clicks" Mobility  Outcome Measure Help needed turning from your back to your side while in a flat bed without using bedrails?: None Help needed moving from lying on your back to sitting on the side of a flat bed without using bedrails?: None Help needed moving to and from a bed to a chair (including a wheelchair)?: None Help needed standing up from a chair using your arms (e.g., wheelchair or bedside chair)?: None Help needed to walk in hospital room?: A Little Help needed climbing 3-5 steps with a railing? : A Little 6 Click Score: 22    End of Session Equipment Utilized During Treatment: Gait belt Activity Tolerance: Patient limited by fatigue Patient left: in chair;with call bell/phone within reach Nurse Communication: Mobility status PT Visit Diagnosis: Unsteadiness on feet (R26.81);Difficulty in walking, not elsewhere classified (R26.2);Muscle weakness (generalized) (M62.81)    Time: 9983-3825 PT Time Calculation (min) (ACUTE ONLY): 31 min   Charges:             Juanda Crumble "Gus" Jeannette Corpus, SPT  06/11/19, 2:05 PM

## 2019-06-11 NOTE — Consult Note (Signed)
Reason for Consult: Shortness of breath dyspnea possible heart failure Referring Physician: Dr. Valente DavidJan Mansy hospitalist Mimi Sheralyn BoatmanMcLachlan primary  Basilio Cairoenny E Macknight is an 63 y.o. female.  HPI: Patient is a 63 year old obese white female with history of smoking asthma COPD anxiety depression fibromyalgia presented emergency room with acute dyspnea with productive yellow sputum cough patient has significant diffuse wheezing over the last 3 to 4 weeks he got progressively worse complains of fever and diaphoresis she has had 2 pillow orthopnea and bilateral leg edema PND dyspnea exertion patient was Covid negative denied any discrete chest pain was found to be slightly hypoxic had slightly elevated BNP with minimal troponins EKG also was nondiagnostic  Past Medical History:  Diagnosis Date  . Anxiety   . Asthma   . Chronic pain    lower back  . COPD (chronic obstructive pulmonary disease) (HCC)   . Depression   . DJD (degenerative joint disease)   . Fibromyalgia   . GERD (gastroesophageal reflux disease)   . Headache   . Hyperlipidemia   . Lower extremity edema   . On home oxygen therapy    has not been on since 2016  . Panic attack   . Shortness of breath dyspnea   . Sleep disorder     Past Surgical History:  Procedure Laterality Date  . ABDOMINAL SURGERY    . ANKLE SURGERY    . BACK SURGERY     lower  . CARPAL TUNNEL RELEASE    . CESAREAN SECTION    . KNEE SURGERY    . TONSILLECTOMY    . TOTAL KNEE ARTHROPLASTY Left 04/14/2016   Procedure: TOTAL KNEE ARTHROPLASTY;  Surgeon: Deeann SaintHoward Densmore, MD;  Location: [facility] ORS;  Service: Orthopedics;  Laterality: Left;  . TUBAL LIGATION      Family History  Problem Relation Age of Onset  . Cancer Mother        beast    Social History:  reports that she has been smoking cigarettes. She has a 45.00 pack-year smoking history. She has never used smokeless tobacco. She reports current alcohol use. She reports that she does not use  drugs.  Allergies:  Allergies  Allergen Reactions  . Gabapentin Shortness Of Breath and Rash  . Mobic [Meloxicam] Anaphylaxis  . Penicillins Shortness Of Breath and Swelling    Has patient had a PCN reaction causing immediate rash, facial/tongue/throat swelling, SOB or lightheadedness with hypotension: unknown Has patient had a PCN reaction causing severe rash involving mucus membranes or skin necrosis: {unknown Has patient had a PCN reaction that required hospitalization {unknown Has patient had a PCN reaction occurring within the last 10 years: no If all of the above answers are "NO", then may proceed with Cephalosporin use.  . Sulfa Antibiotics Shortness Of Breath and Swelling    Medications: I have reviewed the patient's current medications.  Results for orders placed or performed during the hospital encounter of 06/10/19 (from the past 48 hour(s))  CBC with Differential     Status: Abnormal   Collection Time: 06/10/19 11:31 PM  Result Value Ref Range   WBC 7.2 4.0 - 10.5 K/uL   RBC 4.81 3.87 - 5.11 MIL/uL   Hemoglobin 13.8 12.0 - 15.0 g/dL   HCT 16.144.6 09.636.0 - 04.546.0 %   MCV 92.7 80.0 - 100.0 fL   MCH 28.7 26.0 - 34.0 pg   MCHC 30.9 30.0 - 36.0 g/dL   RDW 40.914.5 81.111.5 - 91.415.5 %   Platelets 143 (L)  150 - 400 K/uL   nRBC 0.0 0.0 - 0.2 %   Neutrophils Relative % 52 %   Neutro Abs 3.7 1.7 - 7.7 K/uL   Lymphocytes Relative 36 %   Lymphs Abs 2.6 0.7 - 4.0 K/uL   Monocytes Relative 9 %   Monocytes Absolute 0.6 0.1 - 1.0 K/uL   Eosinophils Relative 3 %   Eosinophils Absolute 0.2 0.0 - 0.5 K/uL   Basophils Relative 0 %   Basophils Absolute 0.0 0.0 - 0.1 K/uL   Immature Granulocytes 0 %   Abs Immature Granulocytes 0.03 0.00 - 0.07 K/uL    Comment: Performed at [facility], [address] [place], [place] [address]  Comprehensive metabolic panel     Status: Abnormal   Collection Time: 06/10/19 11:31 PM  Result Value Ref Range   Sodium 138 135 - 145 mmol/L   Potassium 3.5  3.5 - 5.1 mmol/L   Chloride 101 98 - 111 mmol/L   CO2 30 22 - 32 mmol/L   Glucose, Bld 112 (H) 70 - 99 mg/dL   BUN 15 8 - 23 mg/dL   Creatinine, Ser 0.71 0.44 - 1.00 mg/dL   Calcium 8.5 (L) 8.9 - 10.3 mg/dL   Total Protein 6.4 (L) 6.5 - 8.1 g/dL   Albumin 3.2 (L) 3.5 - 5.0 g/dL   AST 21 15 - 41 U/L   ALT 19 0 - 44 U/L   Alkaline Phosphatase 82 38 - 126 U/L   Total Bilirubin 0.6 0.3 - 1.2 mg/dL   GFR calc non Af Amer >60 >60 mL/min   GFR calc Af Amer >60 >60 mL/min   Anion gap 7 5 - 15    Comment: Performed at [facility], [place], [place] [address]  Troponin I (High Sensitivity)     Status: None   Collection Time: 06/10/19 11:31 PM  Result Value Ref Range   Troponin I (High Sensitivity) 13 <18 ng/L    Comment: (NOTE) Elevated high sensitivity troponin I (hsTnI) values and significant  changes across serial measurements may suggest ACS but many other  chronic and acute conditions are known to elevate hsTnI results.  Refer to the "Links" section for chest pain algorithms and additional  guidance. Performed at [facility], [address] [place], [place] [address]   Brain natriuretic peptide     Status: Abnormal   Collection Time: 06/10/19 11:31 PM  Result Value Ref Range   B Natriuretic Peptide 357.0 (H) 0.0 - 100.0 pg/mL    Comment: Performed at [facility], [address] [place], [place] [address]  SARS Coronavirus 2 by RT PCR (hospital order, performed in [facility] hospital lab) Nasopharyngeal Nasopharyngeal Swab     Status: None   Collection Time: 06/10/19 11:31 PM   Specimen: Nasopharyngeal Swab  Result Value Ref Range   SARS Coronavirus 2 NEGATIVE NEGATIVE    Comment: (NOTE) If result is NEGATIVE SARS-CoV-2 target nucleic acids are NOT DETECTED. The SARS-CoV-2 RNA is generally detectable in upper and lower  respiratory specimens during the acute phase of infection. The lowest  concentration of SARS-CoV-2 viral  copies this assay can detect is 250  copies / mL. A negative result does not preclude SARS-CoV-2 infection  and should not be used as the sole basis for treatment or other  patient management decisions.  A negative result may occur with  improper specimen collection / handling, submission of specimen other  than nasopharyngeal swab,  presence of viral mutation(s) within the  areas targeted by this assay, and inadequate number of viral copies  (<250 copies / mL). A negative result must be combined with clinical  observations, patient history, and epidemiological information. If result is POSITIVE SARS-CoV-2 target nucleic acids are DETECTED. The SARS-CoV-2 RNA is generally detectable in upper and lower  respiratory specimens dur ing the acute phase of infection.  Positive  results are indicative of active infection with SARS-CoV-2.  Clinical  correlation with patient history and other diagnostic information is  necessary to determine patient infection status.  Positive results do  not rule out bacterial infection or co-infection with other viruses. If result is PRESUMPTIVE POSTIVE SARS-CoV-2 nucleic acids MAY BE PRESENT.   A presumptive positive result was obtained on the submitted specimen  and confirmed on repeat testing.  While 2019 novel coronavirus  (SARS-CoV-2) nucleic acids may be present in the submitted sample  additional confirmatory testing may be necessary for epidemiological  and / or clinical management purposes  to differentiate between  SARS-CoV-2 and other Sarbecovirus currently known to infect humans.  If clinically indicated additional testing with an alternate test  methodology [phone]) is advised. The SARS-CoV-2 RNA is generally  detectable in upper and lower respiratory sp ecimens during the acute  phase of infection. The expected result is Negative. Fact Sheet for Patients:  BoilerBrush.com.cy Fact Sheet for Healthcare  Providers: https://pope.com/ This test is not yet approved or cleared by the [place] FDA and has been authorized for detection and/or diagnosis of SARS-CoV-2 by FDA under an Emergency Use Authorization (EUA).  This EUA will remain in effect (meaning this test can be used) for the duration of the COVID-19 declaration under Section 564(b)(1) of the Act, 21 U.S.C. section 360bbb-3(b)(1), unless the authorization is terminated or revoked sooner. Performed at [facility], [address] [place], [place] [address]   Culture, blood (routine x 2) Call MD if unable to obtain prior to antibiotics being given     Status: None (Preliminary result)   Collection Time: 06/11/19  3:49 AM   Specimen: BLOOD  Result Value Ref Range   Specimen Description BLOOD BLOOD RIGHT HAND    Special Requests      BOTTLES DRAWN AEROBIC AND ANAEROBIC Blood Culture results may not be optimal due to an inadequate volume of blood received in culture bottles   Culture      NO GROWTH < 12 HOURS Performed at [facility], [address] [place], [place] [address]    Report Status PENDING   Troponin I (High Sensitivity)     Status: None   Collection Time: 06/11/19  3:50 AM  Result Value Ref Range   Troponin I (High Sensitivity) 8 <18 ng/L    Comment: (NOTE) Elevated high sensitivity troponin I (hsTnI) values and significant  changes across serial measurements may suggest ACS but many other  chronic and acute conditions are known to elevate hsTnI results.  Refer to the "Links" section for chest pain algorithms and additional  guidance. Performed at [facility], [address] [place], [place] [address]   Comprehensive metabolic panel     Status: Abnormal   Collection Time: 06/11/19  3:50 AM  Result Value Ref Range   Sodium 139 135 - 145 mmol/L   Potassium 4.7 3.5 - 5.1 mmol/L   Chloride 99 98 - 111 mmol/L   CO2 31 22 - 32 mmol/L   Glucose, Bld 152  (H) 70 - 99  mg/dL   BUN 13 8 - 23 mg/dL   Creatinine, Ser 1.61 0.44 - 1.00 mg/dL   Calcium 8.4 (L) 8.9 - 10.3 mg/dL   Total Protein 7.0 6.5 - 8.1 g/dL   Albumin 3.5 3.5 - 5.0 g/dL   AST 22 15 - 41 U/L   ALT 20 0 - 44 U/L   Alkaline Phosphatase 87 38 - 126 U/L   Total Bilirubin 0.6 0.3 - 1.2 mg/dL   GFR calc non Af Amer >60 >60 mL/min   GFR calc Af Amer >60 >60 mL/min   Anion gap 9 5 - 15    Comment: Performed at [facility], [address] [place], [place] [address]  CBC     Status: Abnormal   Collection Time: 06/11/19  3:50 AM  Result Value Ref Range   WBC 4.9 4.0 - 10.5 K/uL   RBC 4.99 3.87 - 5.11 MIL/uL   Hemoglobin 14.1 12.0 - 15.0 g/dL   HCT 54.0 98.1 - 19.1 %   MCV 92.2 80.0 - 100.0 fL   MCH 28.3 26.0 - 34.0 pg   MCHC 30.7 30.0 - 36.0 g/dL   RDW 47.8 29.5 - 62.1 %   Platelets 148 (L) 150 - 400 K/uL   nRBC 0.0 0.0 - 0.2 %    Comment: Performed at [facility], [address] [place], [place] [address]  Hemoglobin A1c     Status: None   Collection Time: 06/11/19  3:50 AM  Result Value Ref Range   Hgb A1c MFr Bld 5.4 4.8 - 5.6 %    Comment: (NOTE) Pre diabetes:          5.7%-6.4% Diabetes:              >6.4% Glycemic control for   <7.0% adults with diabetes    Mean Plasma Glucose 108.28 mg/dL    Comment: Performed at [facility], [address] [place], [place] [address]  Culture, blood (routine x 2) Call MD if unable to obtain prior to antibiotics being given     Status: None (Preliminary result)   Collection Time: 06/11/19  3:55 AM   Specimen: BLOOD  Result Value Ref Range   Specimen Description BLOOD BLOOD LEFT WRIST    Special Requests      BOTTLES DRAWN AEROBIC AND ANAEROBIC Blood Culture adequate volume   Culture      NO GROWTH < 12 HOURS Performed at [facility], [address] [place], [place] [address]    Report Status PENDING   Troponin I (High Sensitivity)     Status: None   Collection Time: 06/11/19 10:25  AM  Result Value Ref Range   Troponin I (High Sensitivity) 6 <18 ng/L    Comment: (NOTE) Elevated high sensitivity troponin I (hsTnI) values and significant  changes across serial measurements may suggest ACS but many other  chronic and acute conditions are known to elevate hsTnI results.  Refer to the "Links" section for chest pain algorithms and additional  guidance. Performed at [facility], [address] [place], [place] [address]   MRSA PCR Screening     Status: Abnormal   Collection Time: 06/11/19 10:30 AM   Specimen: Nasopharyngeal  Result Value Ref Range   MRSA by PCR POSITIVE (A) NEGATIVE    Comment:        The GeneXpert MRSA Assay (FDA approved for NASAL specimens only), is one component of a comprehensive MRSA colonization surveillance program.  It is not intended to diagnose MRSA infection nor to guide or monitor treatment for MRSA infections. RESULT CALLED TO, READ BACK BY AND VERIFIED WITH: SERENITY KIKENDAHL AT 1211 ON 06/11/2019 [facility]. Performed at [facility], [address] [place], [place] [address]     Dg Chest Port 1 View  Result Date: 06/10/2019 CLINICAL DATA:  Shortness of breath worsening for 3 weeks EXAM: PORTABLE CHEST 1 VIEW COMPARISON:  Radiograph 10/02/2017, chest CT 09/04/2015 FINDINGS: There is a background of chronic bronchitic changes seen on prior studies. There is increasing hazy interstitial and reticular opacities throughout the lungs most prevalent in the lung bases with some cephalization and indistinctness of the pulmonary vascularity and few peripheral septal lines. No focal consolidative process is clearly evident. No pneumothorax or effusion. The aorta is calcified. The remaining cardiomediastinal contours are unremarkable. No acute osseous or soft tissue abnormality. Degenerative changes are present in the imaged spine and shoulders. IMPRESSION: Appearances suggestive some degree of interstitial edema superimposed  on chronic bronchitic changes present on prior studies. Electronically Signed   By: Kreg Shropshire M.D.   On: 06/10/2019 23:30    Review of Systems  Constitutional: Positive for diaphoresis and malaise/fatigue.  HENT: Positive for congestion and sinus pain.   Eyes: Negative.   Respiratory: Positive for cough, shortness of breath and wheezing.   Cardiovascular: Positive for orthopnea and PND.  Gastrointestinal: Negative.   Genitourinary: Negative.   Musculoskeletal: Negative.   Skin: Negative.   Neurological: Negative.   Endo/Heme/Allergies: Negative.   Psychiatric/Behavioral: Negative.    Blood pressure 106/70, pulse 74, temperature 97.9 F (36.6 C), temperature source Oral, resp. rate 20, height 5' (1.524 m), weight 77.1 kg, SpO2 92 %. Physical Exam  Nursing note and vitals reviewed. Constitutional: She is oriented to person, place, and time. She appears well-developed and well-nourished.  HENT:  Head: Normocephalic and atraumatic.  Eyes: Pupils are equal, round, and reactive to light. Conjunctivae and EOM are normal.  Neck: Normal range of motion. Neck supple.  Cardiovascular: Normal rate, regular rhythm and normal heart sounds.  Respiratory: She has decreased breath sounds. She has wheezes. She has rhonchi.  GI: Soft. Bowel sounds are normal.  Musculoskeletal: Normal range of motion.  Neurological: She is alert and oriented to person, place, and time. She has normal reflexes.  Skin: Skin is warm.  Psychiatric: She has a normal mood and affect.    Assessment/Plan: Shortness of breath Asthma COPD Smoking Fibromyalgia Obesity mild Hypoxemia Possible congestive heart failure Dyslipidemia Anxiety depression . Plan Agree with admit to telemetry Continue supplemental oxygen and inhalers Consider steroid taper to help with bronchitis symptoms Broad-spectrum antibiotic therapy as needed for bronchitis Continue lipid management with statin Agree with Zoloft BuSpar trazodone  Xanax as needed for anxiety depression Consider NicoDerm patch or Zyban for smoking cessation Continue conservative therapy for fibromyalgia  Dwayne D Callwood 06/11/2019, 1:16 PM

## 2019-06-11 NOTE — ED Notes (Signed)
Per 1C, bed placement will be changing pt to telemetry.

## 2019-06-12 LAB — BASIC METABOLIC PANEL
Anion gap: 12 (ref 5–15)
BUN: 20 mg/dL (ref 8–23)
CO2: 31 mmol/L (ref 22–32)
Calcium: 8.5 mg/dL — ABNORMAL LOW (ref 8.9–10.3)
Chloride: 93 mmol/L — ABNORMAL LOW (ref 98–111)
Creatinine, Ser: 0.91 mg/dL (ref 0.44–1.00)
GFR calc Af Amer: >60 mL/min (ref >60)
GFR calc non Af Amer: >60 mL/min (ref >60)
Glucose, Bld: 127 mg/dL — ABNORMAL HIGH (ref 70–99)
Potassium: 3.9 mmol/L (ref 3.5–5.1)
Sodium: 136 mmol/L (ref 135–145)

## 2019-06-12 LAB — CBC
HCT: 42 % (ref 36.0–46.0)
Hemoglobin: 13.3 g/dL (ref 12.0–15.0)
MCH: 28.6 pg (ref 26.0–34.0)
MCHC: 31.7 g/dL (ref 30.0–36.0)
MCV: 90.3 fL (ref 80.0–100.0)
Platelets: 135 10*3/uL — ABNORMAL LOW (ref 150–400)
RBC: 4.65 MIL/uL (ref 3.87–5.11)
RDW: 13.8 % (ref 11.5–15.5)
WBC: 6.1 10*3/uL (ref 4.0–10.5)
nRBC: 0 % (ref 0.0–0.2)

## 2019-06-12 MED ORDER — PHENOL 1.4 % MT LIQD
1.0000 | OROMUCOSAL | Status: DC | PRN
  Filled 2019-06-12: qty 177

## 2019-06-12 MED ORDER — MAGIC MOUTHWASH
5.0000 mL | Freq: Three times a day (TID) | ORAL | Status: DC | PRN
  Administered 2019-06-12: 5 mL via ORAL
  Filled 2019-06-12 (×5): qty 10

## 2019-06-12 MED ORDER — ALBUTEROL SULFATE (2.5 MG/3ML) 0.083% IN NEBU: 2.5000 mg | INHALATION_SOLUTION | RESPIRATORY_TRACT | Status: DC | PRN

## 2019-06-12 MED ORDER — IPRATROPIUM-ALBUTEROL 0.5-2.5 (3) MG/3ML IN SOLN
3.0000 mL | Freq: Three times a day (TID) | RESPIRATORY_TRACT | Status: DC
  Administered 2019-06-12 – 2019-06-13 (×3): 3 mL via RESPIRATORY_TRACT
  Filled 2019-06-12 (×4): qty 3

## 2019-06-12 NOTE — Plan of Care (Signed)
  Problem: Clinical Measurements: Goal: Will remain free from infection Outcome: Progressing   Problem: Clinical Measurements: Goal: Respiratory complications will improve Outcome: Progressing   Problem: Clinical Measurements: Goal: Cardiovascular complication will be avoided Outcome: Progressing   Problem: Elimination: Goal: Will not experience complications related to urinary retention Outcome: Progressing   Problem: Pain Managment: Goal: General experience of comfort will improve Outcome: Progressing: PRN roboxin given for chronic back pain. Relief noted. Will continue to monitor.

## 2019-06-12 NOTE — Progress Notes (Signed)
[facility] [place] [facility]   PATIENT NAME: Bethany Villa    MR#:  [id number]  DATE OF BIRTH:  1956/07/13  SUBJECTIVE:  CHIEF COMPLAINT:   Chief Complaint  Patient presents with  . Shortness of Breath  feels SOB, gets hypoxic on minimal ambulation REVIEW OF SYSTEMS:  Review of Systems  Constitutional: Positive for malaise/fatigue. Negative for diaphoresis, fever and weight loss.  HENT: Negative for ear discharge, ear pain, hearing loss, nosebleeds, sore throat and tinnitus.   Eyes: Negative for blurred vision and pain.  Respiratory: Positive for cough, shortness of breath and wheezing. Negative for hemoptysis.   Cardiovascular: Negative for chest pain, palpitations, orthopnea and leg swelling.  Gastrointestinal: Negative for abdominal pain, blood in stool, constipation, diarrhea, heartburn, nausea and vomiting.  Genitourinary: Negative for dysuria, frequency and urgency.  Musculoskeletal: Negative for back pain and myalgias.  Skin: Negative for itching and rash.  Neurological: Negative for dizziness, tingling, tremors, focal weakness, seizures, weakness and headaches.  Psychiatric/Behavioral: Negative for depression. The patient is not nervous/anxious.     DRUG ALLERGIES:   Allergies  Allergen Reactions  . Gabapentin Shortness Of Breath and Rash  . Mobic [Meloxicam] Anaphylaxis  . Penicillins Shortness Of Breath and Swelling    Has patient had a PCN reaction causing immediate rash, facial/tongue/throat swelling, SOB or lightheadedness with hypotension: unknown Has patient had a PCN reaction causing severe rash involving mucus membranes or skin necrosis: {unknown Has patient had a PCN reaction that required hospitalization {unknown Has patient had a PCN reaction occurring within the last 10 years: no If all of the above answers are "NO", then may proceed with Cephalosporin use.  . Sulfa Antibiotics Shortness Of Breath and Swelling   VITALS:  Blood  pressure 114/80, pulse 81, temperature 97.6 F (36.4 C), temperature source Oral, resp. rate 18, height 5\' 3"  (1.6 m), weight 77.1 kg, SpO2 (!) 88 %. PHYSICAL EXAMINATION:  Physical Exam HENT:     Head: Normocephalic and atraumatic.  Eyes:     Conjunctiva/sclera: Conjunctivae normal.     Pupils: Pupils are equal, round, and reactive to light.  Neck:     Musculoskeletal: Normal range of motion and neck supple.     Thyroid: No thyromegaly.     Trachea: No tracheal deviation.  Cardiovascular:     Rate and Rhythm: Normal rate and regular rhythm.     Heart sounds: Normal heart sounds.  Pulmonary:     Effort: Pulmonary effort is normal. No respiratory distress.     Breath sounds: Normal breath sounds. No wheezing.  Chest:     Chest wall: No tenderness.  Abdominal:     General: Bowel sounds are normal. There is no distension.     Palpations: Abdomen is soft.     Tenderness: There is no abdominal tenderness.  Musculoskeletal: Normal range of motion.  Skin:    General: Skin is warm and dry.     Findings: No rash.  Neurological:     Mental Status: She is alert and oriented to person, place, and time.     Cranial Nerves: No cranial nerve deficit.    LABORATORY PANEL:  Female CBC Recent Labs  Lab 06/12/19 0520  WBC 6.1  HGB 13.3  HCT 42.0  PLT 135*   ------------------------------------------------------------------------------------------------------------------ Chemistries  Recent Labs  Lab 06/11/19 0350 06/12/19 0520  NA 139 136  K 4.7 3.9  CL 99 93*  CO2 31 31  GLUCOSE 152* 127*  BUN 13  20  CREATININE 0.73 0.91  CALCIUM 8.4* 8.5*  AST 22  --   ALT 20  --   ALKPHOS 87  --   BILITOT 0.6  --    RADIOLOGY:  No results found. ASSESSMENT AND PLAN:  1. COPD acute exacerbation likely secondary to acute bronchitis -Continue IV Solu-Medrol as well as nebulized bronchodilator therapy with duonebs q.i.d. and q.4 hours p.r.n., mucolytic therapy with Mucinex and  antibiotic therapy with IVLevaquin. Sputum culture sensitivity pending - wean O2 as able  2. Acute on top of chronic diastolic CHF -Continue IV Lasix. Negative serial cardiac enzymes. Pending 2D echo, appreciate cardiology input.Last EF was 55 to 60% in February 2019.  3. Ongoing tobacco abuse-counseled the patient for smoking cessation and the patient will receive further counseling here.NicoDerm patch ordered  4. Anxiety anddepression - continue Zoloft and BuSpar, trazodone as well as PRN Xanax.  5. Dyslipidemia. Statin therapy   6. DVT prophylaxis. Subcutaneous Lovenoxand GI prophylaxis with p.o. Pepcid given current steroid therapy     All the records are reviewed and case discussed with Care Management/Social Worker. Management plans discussed with the patient, nursing and they are in agreement.  CODE STATUS: Full Code  TOTAL TIME TAKING CARE OF THIS PATIENT: 35 minutes.   More than 50% of the time was spent in counseling/coordination of care: YES  POSSIBLE D/C IN 1-2 DAYS, DEPENDING ON CLINICAL CONDITION.   Max Sane M.D on 06/12/2019 at 3:57 PM  Between 7am to 6pm - Pager - [phone]  After 6pm go to [email or web address] - Proofreader  [facility] [place] [facility]  Office  [phone]  CC: Primary care physician; Marinda Elk, MD  Note: This dictation was prepared with Dragon dictation along with smaller phrase technology. Any transcriptional errors that result from this process are unintentional.

## 2019-06-12 NOTE — Plan of Care (Signed)
Nutrition Education Note  RD consulted for nutrition education regarding CHF.  Spoke with patient via phone today. Pt reports good appetite and oral intake at baseline but reports her appetite is slightly decreased in hospital. Pt reports that she does not like the food and that it doesn't have much flavor. Pt has not yet been offered and Ensure. RD will change patient from a heart healthy diet to a low sodium diet as this will allow her some more menu options. Pt is planning to request and Ensure for this evening.   RD provided "Low Sodium Nutrition Therapy" handout from the Academy of [employer] and [employer]. Reviewed patient's dietary recall. Provided examples on ways to decrease sodium intake in diet. Discouraged intake of processed foods and use of salt shaker. Encouraged fresh fruits and vegetables as well as whole grain sources of carbohydrates to maximize fiber intake.   RD discussed why it is important for patient to adhere to diet recommendations, and emphasized the role of fluids, foods to avoid, and importance of weighing self daily. Teach back method used.  Expect good compliance.  RD is following this patient   Koleen Distance MS, RD, LDN Pager #- [phone] Office#- [phone] After Hours Pager: [phone]

## 2019-06-12 NOTE — Progress Notes (Signed)
Physical Therapy Treatment Patient Details Name: Bethany Villa MRN: [id number] DOB: 07-17-1956 Today's Date: 06/12/2019    History of Present Illness Per MD: Pt is a 63 y.o. Caucasian female with a known history of COPD/asthma, anxiety/depression and fibromyalgia, presented to the emergency room with acute onset of dyspnea with associated cough productive of yellowish sputum as well as wheezing which have been going on over the last 3 weeks and significantly worsening over the last week.  She admitted to tactile fever but diaphoresis without chills. She has also been having 2 pillow orthopnea as well as mild lower extremity edema, paroxysmal nocturnal dyspnea and dyspnea on exertion. 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    PT Comments    Pt presented with min deficits in strength, transfers, mobility, gait, balance, and activity tolerance. Pt on 2LO2 with humidifier, and reported SOB was a 0/10 at rest and 5/10 at end of 100-foot walk, and returned to baseline in less than two minutes. Pt completed bed exercise, mobility, and transfer Ind. Pt completed ambulation with RW and supervision to help with O2. Pt completed a 50-ft walk: SpO2 was 93%, HR 97; and after a 100-ft walk SpO2 was 90% and HR 112, SpO2 returned to low 90's within 60 seconds. Pt will benefit from HHPT services upon discharge to continue to improve activity capacity/tolerance, and to safely address above deficits for decreased caregiver assistance and eventual return to PLOF.    Follow Up Recommendations  Home health PT     Equipment Recommendations  None recommended by PT    Recommendations for Other Services       Precautions / Restrictions Precautions Precautions: Other (comment) Precaution Comments: watch O2 sats with exertional activity Restrictions Weight Bearing Restrictions: No    Mobility  Bed Mobility Overal bed mobility: Independent                 Transfers Overall transfer level: Independent                  Ambulation/Gait Ambulation/Gait assistance: Supervision Gait Distance (Feet): 100 Feet x1, 50 feet x1 Assistive device: Rolling walker (2 wheeled) Gait Pattern/deviations: WFL(Within Functional Limits) Gait velocity: Decreased   General Gait Details: Supervision for safety and equipment/O2.   Stairs             Wheelchair Mobility    Modified Rankin (Stroke Patients Only)       Balance Overall balance assessment: Mild deficits observed, not formally tested                                          Cognition Arousal/Alertness: Awake/alert Behavior During Therapy: WFL for tasks assessed/performed Overall Cognitive Status: Within Functional Limits for tasks assessed                                        Exercises Total Joint Exercises Ankle Circles/Pumps: AROM;Strengthening;Both;10 reps;15 reps Quad Sets: AROM;Strengthening;Both;10 reps;15 reps Gluteal Sets: Strengthening;Both;10 reps;15 reps Hip ABduction/ADduction: AROM;Strengthening;Both;10 reps;15 reps Other Exercises Other Exercises: Pt reminded of pursed lip breathing and breath recovery following and during exertional activities to minimize SOB    General Comments        Pertinent Vitals/Pain Pain Score: 5  Pain Location: Pain scale was converted into a "  SOB scale". Pt report feeling SOB of 0/10 at rest, and 5/10 after a medium walk (100 ft) Pain Descriptors / Indicators: Tightness    Home Living                      Prior Function            PT Goals (current goals can now be found in the care plan section) Progress towards PT goals: Progressing toward goals    Frequency    Min 2X/week      PT Plan Current plan remains appropriate    Co-evaluation              AM-PAC PT "6 Clicks" Mobility   Outcome Measure  Help needed turning from your back to your side  while in a flat bed without using bedrails?: None Help needed moving from lying on your back to sitting on the side of a flat bed without using bedrails?: None Help needed moving to and from a bed to a chair (including a wheelchair)?: None Help needed standing up from a chair using your arms (e.g., wheelchair or bedside chair)?: None Help needed to walk in hospital room?: A Little Help needed climbing 3-5 steps with a railing? : A Little 6 Click Score: 22    End of Session Equipment Utilized During Treatment: Gait belt Activity Tolerance: Patient limited by fatigue Patient left: in chair;with call bell/phone within reach Nurse Communication: Mobility status PT Visit Diagnosis: Unsteadiness on feet (R26.81);Difficulty in walking, not elsewhere classified (R26.2);Muscle weakness (generalized) (M62.81)     Time: 2956-2130 PT Time Calculation (min) (ACUTE ONLY): 24 min  Charges:                        Leonette Most "Gus" Roni Bread, SPT  06/12/19, 5:26 PM

## 2019-06-12 NOTE — Progress Notes (Signed)
SATURATION QUALIFICATIONS: (This note is used to comply with regulatory documentation for home oxygen)  Patient Saturations on Room Air at Rest = 92%  Patient Saturations on Room Air while Ambulating = 86%  Patient Saturations on 2 Liters of oxygen while Ambulating = 93%  Please briefly explain why patient needs home oxygen: COPD/CHF

## 2019-06-12 NOTE — TOC Initial Note (Signed)
Transition of Care [facility]) - Initial/Assessment Note    Patient Details  Name: Bethany Villa MRN: [id number] Date of Birth: [birth year]  [facility]) CM/SW Contact:    Elza Rafter, RN Phone Number: 06/12/2019, 10:15 AM  Clinical Narrative:  From home alone.  Admitted with COPD/CHF exacerbation.  Acute O2 at 2L. Receiving IV lasix 40 BID.  BNP 357.  Does not have a scale; when asked if she understood the importance of daily weights she was unsure.  Provided patient with scale and educated on daily weights.  Added dietary consult.  Current with PCP; obtains medications at [facility] deliver.   Does not drive; she has friends that transport her as needed.  Offered home health services and patient declines.          Expected Discharge Plan: Home/Self Care Barriers to Discharge: Continued Medical Work up   Patient Goals and CMS Choice        Expected Discharge Plan and Services Expected Discharge Plan: Home/Self Care   Discharge Planning Services: CM Consult   Living arrangements for the past 2 months: Single Family Home                           HH Arranged: Refused HH          Prior Living Arrangements/Services Living arrangements for the past 2 months: Single Family Home Lives with:: Self   Do you feel safe going back to the place where you live?: Yes            Criminal Activity/Legal Involvement Pertinent to Current Situation/Hospitalization: No - Comment as needed  Activities of Daily Living Home Assistive Devices/Equipment: Eyeglasses ADL Screening (condition at time of admission) Patient's cognitive ability adequate to safely complete daily activities?: Yes Is the patient deaf or have difficulty hearing?: No Does the patient have difficulty seeing, even when wearing glasses/contacts?: Yes Does the patient have difficulty concentrating, remembering, or making decisions?: No Patient able to express need for assistance with  ADLs?: Yes Does the patient have difficulty dressing or bathing?: No Independently performs ADLs?: Yes (appropriate for developmental age) Does the patient have difficulty walking or climbing stairs?: Yes Weakness of Legs: Both Weakness of Arms/Hands: None  Permission Sought/Granted                  Emotional Assessment Appearance:: Appears stated age Attitude/Demeanor/Rapport: Gracious Affect (typically observed): Accepting Orientation: : Oriented to Self, Oriented to Place, Oriented to  Time, Oriented to Situation Alcohol / Substance Use: Not Applicable Psych Involvement: No (comment)  Admission diagnosis:  SOB (shortness of breath) [R06.02] COPD exacerbation (HCC) [J44.1] Patient Active Problem List   Diagnosis Date Noted  . Bilateral primary osteoarthritis of knee 04/26/2019  . Chronic bilateral thoracic back pain 12/05/2018  . Cervicalgia 12/05/2018  . 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 Acute adjustment disorder with anxiety 10/06/2017  . Palliative care by specialist   . Respiratory failure ([place]) 10/01/2017  . COPD exacerbation ([place]) 09/01/2016  . Elevated brain natriuretic peptide (BNP) level 09/01/2016  . LFT elevation 09/01/2016  . 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 Chronic obstructive pulmonary disease with acute lower respiratory infection ([place])   . Acute respiratory failure with hypoxia and hypercapnia (HCC)   . Sepsis ([place]) 09/03/2015  . CAP (community acquired pneumonia) 09/01/2015  . SIRS (systemic inflammatory response syndrome) ([place]) 07/24/2014  . COPD with  acute exacerbation (HCC) 09/29/2013  . Insomnia 11/04/2012  . Generalized anxiety disorder 11/04/2012  . Depression 11/04/2012  . Fibromyalgia 11/04/2012  . Metabolic encephalopathy 10/03/2011  . COPD (chronic obstructive pulmonary disease) (HCC) 06/01/2011  . Smoking 05/19/2011  . Chronic pain disorder 05/19/2011  . Obesity 05/19/2011  .  Hyperglycemia 05/19/2011   PCP:  [name], MD Pharmacy:   [facility] [address] [place] - [address] [place] [place] [address] Phone: [phone] Fax: [phone]  [facility] - [place], [place] - [address] [place] [address] Phone: [phone] Fax: [phone]     Social Determinants of Health (SDOH) Interventions    Readmission Risk Interventions No flowsheet data found.

## 2019-06-13 LAB — CBC
HCT: 46.4 % — ABNORMAL HIGH (ref 36.0–46.0)
Hemoglobin: 14.5 g/dL (ref 12.0–15.0)
MCH: 28.1 pg (ref 26.0–34.0)
MCHC: 31.3 g/dL (ref 30.0–36.0)
MCV: 89.9 fL (ref 80.0–100.0)
Platelets: 143 10*3/uL — ABNORMAL LOW (ref 150–400)
RBC: 5.16 MIL/uL — ABNORMAL HIGH (ref 3.87–5.11)
RDW: 14 % (ref 11.5–15.5)
WBC: 8.5 10*3/uL (ref 4.0–10.5)
nRBC: 0 % (ref 0.0–0.2)

## 2019-06-13 LAB — BASIC METABOLIC PANEL
Anion gap: 13 (ref 5–15)
BUN: 21 mg/dL (ref 8–23)
CO2: 38 mmol/L — ABNORMAL HIGH (ref 22–32)
Calcium: 8.6 mg/dL — ABNORMAL LOW (ref 8.9–10.3)
Chloride: 89 mmol/L — ABNORMAL LOW (ref 98–111)
Creatinine, Ser: 0.85 mg/dL (ref 0.44–1.00)
GFR calc Af Amer: >60 mL/min (ref >60)
GFR calc non Af Amer: >60 mL/min (ref >60)
Glucose, Bld: 115 mg/dL — ABNORMAL HIGH (ref 70–99)
Potassium: 3.5 mmol/L (ref 3.5–5.1)
Sodium: 140 mmol/L (ref 135–145)

## 2019-06-13 MED ORDER — PHENOL 1.4 % MT LIQD: 1.0000 | OROMUCOSAL | 0 refills | Status: DC | PRN

## 2019-06-13 MED ORDER — MAGIC MOUTHWASH: 5.0000 mL | Freq: Three times a day (TID) | ORAL | 0 refills | Status: DC | PRN

## 2019-06-13 MED ORDER — ENSURE ENLIVE PO LIQD: 237.0000 mL | Freq: Two times a day (BID) | ORAL | 12 refills | Status: DC

## 2019-06-13 MED ORDER — NICOTINE 21 MG/24HR TD PT24: 21.0000 mg | MEDICATED_PATCH | Freq: Every day | TRANSDERMAL | 0 refills | Status: DC

## 2019-06-13 MED ORDER — INFLUENZA VAC SPLIT QUAD 0.5 ML IM SUSY: 0.5000 mL | PREFILLED_SYRINGE | INTRAMUSCULAR | Status: DC

## 2019-06-13 MED ORDER — PREDNISONE 10 MG (21) PO TBPK: ORAL_TABLET | ORAL | 0 refills | Status: DC

## 2019-06-13 MED ORDER — GUAIFENESIN-DM 100-10 MG/5ML PO SYRP: 5.0000 mL | ORAL_SOLUTION | ORAL | 0 refills | Status: DC | PRN

## 2019-06-13 MED ORDER — INFLUENZA VAC SPLIT QUAD 0.5 ML IM SUSY
0.5000 mL | PREFILLED_SYRINGE | INTRAMUSCULAR | Status: AC
  Administered 2019-06-13: 0.5 mL via INTRAMUSCULAR
  Filled 2019-06-13: qty 0.5

## 2019-06-13 NOTE — Plan of Care (Signed)
  Problem: Clinical Measurements: Goal: Respiratory complications will improve Outcome: Progressing  Qualified for home O2 Problem: Activity: Goal: Risk for activity intolerance will decrease Outcome: Progressing   Problem: Elimination: Goal: Will not experience complications related to urinary retention Outcome: Progressing   Problem: Activity: Goal: Ability to tolerate increased activity will improve Outcome: Progressing   Problem: Respiratory: Goal: Ability to maintain a clear airway will improve Outcome: Progressing Goal: Levels of oxygenation will improve Outcome: Progressing  Qualified for home O2

## 2019-06-13 NOTE — Discharge Instructions (Signed)
Chronic Obstructive Pulmonary Disease °Chronic obstructive pulmonary disease (COPD) is a long-term (chronic) lung problem. When you have COPD, it is hard for air to get in and out of your lungs. Usually the condition gets worse over time, and your lungs will never return to normal. There are things you can do to keep yourself as healthy as possible. °· Your doctor may treat your condition with: °? Medicines. °? Oxygen. °? Lung surgery. °· Your doctor may also recommend: °? Rehabilitation. This includes steps to make your body work better. It may involve a team of specialists. °? Quitting smoking, if you smoke. °? Exercise and changes to your diet. °? Comfort measures (palliative care). °Follow these instructions at home: °Medicines °· Take over-the-counter and prescription medicines only as told by your doctor. °· Talk to your doctor before taking any cough or allergy medicines. You may need to avoid medicines that cause your lungs to be dry. °Lifestyle °· If you smoke, stop. Smoking makes the problem worse. If you need help quitting, ask your doctor. °· Avoid being around things that make your breathing worse. This may include smoke, chemicals, and fumes. °· Stay active, but remember to rest as well. °· Learn and use tips on how to relax. °· Make sure you get enough sleep. Most adults need at least 7 hours of sleep every night. °· Eat healthy foods. Eat smaller meals more often. Rest before meals. °Controlled breathing °Learn and use tips on how to control your breathing as told by your doctor. Try: °· Breathing in (inhaling) through your nose for 1 second. Then, pucker your lips and breath out (exhale) through your lips for 2 seconds. °· Putting one hand on your belly (abdomen). Breathe in slowly through your nose for 1 second. Your hand on your belly should move out. Pucker your lips and breathe out slowly through your lips. Your hand on your belly should move in as you breathe out. ° °Controlled coughing °Learn  and use controlled coughing to clear mucus from your lungs. Follow these steps: °1. Lean your head a little forward. °2. Breathe in deeply. °3. Try to hold your breath for 3 seconds. °4. Keep your mouth slightly open while coughing 2 times. °5. Spit any mucus out into a tissue. °6. Rest and do the steps again 1 or 2 times as needed. °General instructions °· Make sure you get all the shots (vaccines) that your doctor recommends. Ask your doctor about a flu shot and a pneumonia shot. °· Use oxygen therapy and pulmonary rehabilitation if told by your doctor. If you need home oxygen therapy, ask your doctor if you should buy a tool to measure your oxygen level (oximeter). °· Make a COPD action plan with your doctor. This helps you to know what to do if you feel worse than usual. °· Manage any other conditions you have as told by your doctor. °· Avoid going outside when it is very hot, cold, or humid. °· Avoid people who have a sickness you can catch (contagious). °· Keep all follow-up visits as told by your doctor. This is important. °Contact a doctor if: °· You cough up more mucus than usual. °· There is a change in the color or thickness of the mucus. °· It is harder to breathe than usual. °· Your breathing is faster than usual. °· You have trouble sleeping. °· You need to use your medicines more often than usual. °· You have trouble doing your normal activities such as getting dressed   or walking around the house. °Get help right away if: °· You have shortness of breath while resting. °· You have shortness of breath that stops you from: °? Being able to talk. °? Doing normal activities. °· Your chest hurts for longer than 5 minutes. °· Your skin color is more blue than usual. °· Your pulse oximeter shows that you have low oxygen for longer than 5 minutes. °· You have a fever. °· You feel too tired to breathe normally. °Summary °· Chronic obstructive pulmonary disease (COPD) is a long-term lung problem. °· The way your  lungs work will never return to normal. Usually the condition gets worse over time. There are things you can do to keep yourself as healthy as possible. °· Take over-the-counter and prescription medicines only as told by your doctor. °· If you smoke, stop. Smoking makes the problem worse. °This information is not intended to replace advice given to you by your health care provider. Make sure you discuss any questions you have with your health care provider. °Document Released: 02/02/2008 Document Revised: 07/29/2017 Document Reviewed: 09/20/2016 °Elsevier Patient Education © 2020 [employer] ° °

## 2019-06-13 NOTE — TOC Transition Note (Signed)
Transition of Care [facility]) - CM/SW Discharge Note   Patient Details  Name: Bethany Villa MRN: [id number] Date of Birth: [birth year]  Transition of Care [facility]) CM/SW Contact:  Elza Rafter, RN Phone Number: 06/13/2019, 11:37 AM   Clinical Narrative:  Patient is discharging to home today with a friend.  [employer] to deliver O2 to bedside prior to DC.  Patient does not have any money and cannot fill her prescriptions until the 1st of the month.  Spoke with Ovid Curd at [employer].  He is filling prescriptions and this RNCM is using $29.48 for [employer] petty cash.  This RNCM will pick up prescriptions and deliver to patient room.  No further needs identified at this time.    Final next level of care: Home/Self Care Barriers to Discharge: No Barriers Identified   Patient Goals and CMS Choice        Discharge Placement                       Discharge Plan and Services   Discharge Planning Services: Medication Assistance            DME Arranged: Oxygen DME Agency: [facility] Date DME Agency Contacted: 06/13/19 Time DME Agency Contacted: 1000 Representative spoke with at DME Agency: Jeneen Rinks HH Arranged: Refused [place]          Social Determinants of Health ([place]) Interventions     Readmission Risk Interventions No flowsheet data found.

## 2019-06-14 LAB — ECHOCARDIOGRAM COMPLETE
Height: 60 in
Weight: 2720 oz

## 2019-06-14 NOTE — Discharge Summary (Signed)
[facility] [place] [facility]   PATIENT NAME: Bethany Villa    MR#:  [id number]  DATE OF BIRTH:  01/14/1956  DATE OF ADMISSION:  06/10/2019   ADMITTING PHYSICIAN: Hannah BeatJan A Mansy, MD  DATE OF DISCHARGE: 06/13/2019  1:20 PM  PRIMARY CARE PHYSICIAN: Patrice ParadiseMcLaughlin, Miriam K, MD   ADMISSION DIAGNOSIS:  SOB (shortness of breath) [R06.02] COPD exacerbation (HCC) [J44.1] DISCHARGE DIAGNOSIS:  Active Problems:   COPD exacerbation (HCC)  SECONDARY DIAGNOSIS:   Past Medical History:  Diagnosis Date  . Anxiety   . Asthma   . Chronic pain    lower back  . COPD (chronic obstructive pulmonary disease) (HCC)   . Depression   . DJD (degenerative joint disease)   . Fibromyalgia   . GERD (gastroesophageal reflux disease)   . Headache   . Hyperlipidemia   . Lower extremity edema   . On home oxygen therapy    has not been on since 2016  . Panic attack   . Shortness of breath dyspnea   . Sleep disorder    HOSPITAL COURSE:   1. COPD acute exacerbation likely secondary to acute bronchitis -improved with steroids and nebs  2. Acute on top of chronic diastolic CHF - -ContinueIV Lasix. Negativeserial cardiac enzymes. Echo shows EF 40-45%  3. Ongoing tobacco abuse-counseled  4. Anxiety anddepression- continue home meds  5. Dyslipidemia. Statin therapy   DISCHARGE CONDITIONS:  stable CONSULTS OBTAINED:   DRUG ALLERGIES:   Allergies  Allergen Reactions  . Gabapentin Shortness Of Breath and Rash  . Mobic [Meloxicam] Anaphylaxis  . Penicillins Shortness Of Breath and Swelling    Has patient had a PCN reaction causing immediate rash, facial/tongue/throat swelling, SOB or lightheadedness with hypotension: unknown Has patient had a PCN reaction causing severe rash involving mucus membranes or skin necrosis: {unknown Has patient had a PCN reaction that required hospitalization {unknown Has patient had a PCN reaction occurring within the last 10  years: no If all of the above answers are "NO", then may proceed with Cephalosporin use.  . Sulfa Antibiotics Shortness Of Breath and Swelling   DISCHARGE MEDICATIONS:   Allergies as of 06/13/2019      Reactions   Gabapentin Shortness Of Breath, Rash   Mobic [meloxicam] Anaphylaxis   Penicillins Shortness Of Breath, Swelling   Has patient had a PCN reaction causing immediate rash, facial/tongue/throat swelling, SOB or lightheadedness with hypotension: unknown Has patient had a PCN reaction causing severe rash involving mucus membranes or skin necrosis: {unknown Has patient had a PCN reaction that required hospitalization {unknown Has patient had a PCN reaction occurring within the last 10 years: no If all of the above answers are "NO", then may proceed with Cephalosporin use.   Sulfa Antibiotics Shortness Of Breath, Swelling      Medication List    TAKE these medications   albuterol (2.5 MG/3ML) 0.083% nebulizer solution Commonly known as: PROVENTIL TAKE 3 ML BY NEBULIZATION Q 6 hrs prn FOR WHEEZING OR SOB Dx J44.9   Ventolin HFA 108 (90 Base) MCG/ACT inhaler Generic drug: albuterol INHALE 2 PUFFS BY MOUTH EVERY 6 HOURS AS NEEDED FOR WHEEZE   atorvastatin 20 MG tablet Commonly known as: LIPITOR TAKE 1 TABLET (20 MG TOTAL) BY MOUTH DAILY.   buprenorphine 10 MCG/HR Ptwk patch Commonly known as: BUTRANS Place 1 patch onto the skin once a week.   busPIRone 5 MG tablet Commonly known as: BUSPAR Take 5 mg by mouth 2 (two) times  daily.   feeding supplement (ENSURE ENLIVE) Liqd Take 237 mLs by mouth 2 (two) times daily between meals.   guaiFENesin-dextromethorphan 100-10 MG/5ML syrup Commonly known as: ROBITUSSIN DM Take 5 mLs by mouth every 4 (four) hours as needed for cough.   magic mouthwash Soln Take 5 mLs by mouth 3 (three) times daily as needed for mouth pain.   methocarbamol 750 MG tablet Commonly known as: ROBAXIN Take 1 tablet (750 mg total) by mouth every 8  (eight) hours as needed for muscle spasms.   nicotine 21 mg/24hr patch Commonly known as: NICODERM CQ - dosed in mg/24 hours Place 1 patch (21 mg total) onto the skin daily.   phenol 1.4 % Liqd Commonly known as: CHLORASEPTIC Use as directed 1 spray in the mouth or throat as needed for throat irritation / pain.   predniSONE 10 MG (21) Tbpk tablet Commonly known as: STERAPRED UNI-PAK 21 TAB Start 60 mg po daily, taper 10 mg daily until done   sertraline 50 MG tablet Commonly known as: ZOLOFT Take 1 tablet (50 mg total) by mouth daily. What changed: how much to take   traZODone 100 MG tablet Commonly known as: DESYREL Take 1 tablet (100 mg total) by mouth at bedtime.        DISCHARGE INSTRUCTIONS:   DIET:  Regular diet DISCHARGE CONDITION:  Good ACTIVITY:  Activity as tolerated OXYGEN:  Home Oxygen: No.  Oxygen Delivery: room air DISCHARGE LOCATION:  home   If you experience worsening of your admission symptoms, develop shortness of breath, life threatening emergency, suicidal or homicidal thoughts you must seek medical attention immediately by calling 911 or calling your MD immediately  if symptoms less severe.  You Must read complete instructions/literature along with all the possible adverse reactions/side effects for all the Medicines you take and that have been prescribed to you. Take any new Medicines after you have completely understood and accpet all the possible adverse reactions/side effects.   Please note  You were cared for by a hospitalist during your hospital stay. If you have any questions about your discharge medications or the care you received while you were in the hospital after you are discharged, you can call the unit and asked to speak with the hospitalist on call if the hospitalist that took care of you is not available. Once you are discharged, your primary care physician will handle any further medical issues. Please note that NO REFILLS for any  discharge medications will be authorized once you are discharged, as it is imperative that you return to your primary care physician (or establish a relationship with a primary care physician if you do not have one) for your aftercare needs so that they can reassess your need for medications and monitor your lab values.    On the day of Discharge:  VITAL SIGNS:  Blood pressure 108/73, pulse 84, temperature 97.6 F (36.4 C), temperature source Oral, resp. rate 18, height 5\' 3"  (1.6 m), weight 79.7 kg, SpO2 96 %. PHYSICAL EXAMINATION:  GENERAL:  63 y.o.-year-old patient lying in the bed with no acute distress.  EYES: Pupils equal, round, reactive to light and accommodation. No scleral icterus. Extraocular muscles intact.  HEENT: Head atraumatic, normocephalic. Oropharynx and nasopharynx clear.  NECK:  Supple, no jugular venous distention. No thyroid enlargement, no tenderness.  LUNGS: Normal breath sounds bilaterally, no wheezing, rales,rhonchi or crepitation. No use of accessory muscles of respiration.  CARDIOVASCULAR: S1, S2 normal. No murmurs, rubs, or gallops.  ABDOMEN:  Soft, non-tender, non-distended. Bowel sounds present. No organomegaly or mass.  EXTREMITIES: No pedal edema, cyanosis, or clubbing.  NEUROLOGIC: Cranial nerves II through XII are intact. Muscle strength 5/5 in all extremities. Sensation intact. Gait not checked.  PSYCHIATRIC: The patient is alert and oriented x 3.  SKIN: No obvious rash, lesion, or ulcer.  DATA REVIEW:   CBC Recent Labs  Lab 06/13/19 0452  WBC 8.5  HGB 14.5  HCT 46.4*  PLT 143*    Chemistries  Recent Labs  Lab 06/11/19 0350  06/13/19 0452  NA 139   < > 140  K 4.7   < > 3.5  CL 99   < > 89*  CO2 31   < > 38*  GLUCOSE 152*   < > 115*  BUN 13   < > 21  CREATININE 0.73   < > 0.85  CALCIUM 8.4*   < > 8.6*  AST 22  --   --   ALT 20  --   --   ALKPHOS 87  --   --   BILITOT 0.6  --   --    < > = values in this interval not displayed.     Follow-up Information    [name], MD. Go on 06/19/2019.   Specialty: Physician Assistant Why: APPOINTMENT AT 9:30AM Contact information: [address] [facility] [place] [address] [phone]        Vida Rigger, MD. Go on 06/27/2019.   Specialty: Pulmonary Disease Why: APPOINTMENT AT 11AM Contact information: [address] [place] [address] [phone]           Management plans discussed with the patient, family and they are in agreement.  CODE STATUS: Prior   TOTAL TIME TAKING CARE OF THIS PATIENT: 45 minutes.    Delfino Lovett M.D on 06/14/2019 at 9:38 PM  Between 7am to 6pm - Pager - [phone]  After 6pm go to [email or web address] - Social research officer, government  [facility]  Office  [phone]  CC: Primary care physician; [name], MD   Note: This dictation was prepared with Dragon dictation along with smaller phrase technology. Any transcriptional errors that result from this process are unintentional.

## 2019-06-16 LAB — CULTURE, BLOOD (ROUTINE X 2)
Culture: NO GROWTH
Culture: NO GROWTH
Special Requests: ADEQUATE

## 2019-06-19 ENCOUNTER — Telehealth: Payer: Self-pay | Admitting: *Deleted

## 2019-06-19 NOTE — Telephone Encounter (Signed)
Patient is scheduled for Virtual Visit Eval of Neck pain on 06-28-19 at 1:45

## 2019-06-19 NOTE — Telephone Encounter (Signed)
Patient needs to make an appointment for medication changes to be discussed.

## 2019-06-27 ENCOUNTER — Telehealth: Payer: Self-pay | Admitting: *Deleted

## 2019-06-27 NOTE — Telephone Encounter (Signed)
Called her back, message left on voicemail.

## 2019-06-28 ENCOUNTER — Encounter: Payer: Self-pay | Admitting: Student in an Organized Health Care Education/Training Program

## 2019-06-28 ENCOUNTER — Other Ambulatory Visit: Payer: Self-pay

## 2019-06-28 ENCOUNTER — Ambulatory Visit
Payer: Medicare Other | Attending: Student in an Organized Health Care Education/Training Program | Admitting: Student in an Organized Health Care Education/Training Program

## 2019-06-28 DIAGNOSIS — G894 Chronic pain syndrome: Secondary | ICD-10-CM

## 2019-06-28 NOTE — Progress Notes (Signed)
I attempted to call the patient however no response. Voicemail left instructing patient to call front desk office at [phone] to reschedule appointment. -Dr Aleiyah Halpin  

## 2019-07-05 ENCOUNTER — Encounter: Payer: Self-pay | Admitting: Student in an Organized Health Care Education/Training Program

## 2019-07-05 ENCOUNTER — Other Ambulatory Visit: Payer: Self-pay

## 2019-07-05 ENCOUNTER — Ambulatory Visit
Payer: Medicare Other | Attending: Student in an Organized Health Care Education/Training Program | Admitting: Student in an Organized Health Care Education/Training Program

## 2019-07-05 DIAGNOSIS — M5030 Other cervical disc degeneration, unspecified cervical region: Secondary | ICD-10-CM | POA: Diagnosis not present

## 2019-07-05 DIAGNOSIS — M797 Fibromyalgia: Secondary | ICD-10-CM | POA: Diagnosis not present

## 2019-07-05 DIAGNOSIS — M542 Cervicalgia: Secondary | ICD-10-CM | POA: Diagnosis not present

## 2019-07-05 MED ORDER — TIZANIDINE HCL 4 MG PO TABS: 4.0000 mg | ORAL_TABLET | Freq: Two times a day (BID) | ORAL | 2 refills | Status: DC | PRN

## 2019-07-05 NOTE — Progress Notes (Signed)
Pain Management Virtual Encounter Note - Virtual Visit via Max (real-time audio visits between healthcare provider and patient).   Patient's Phone No. & Preferred Pharmacy:  [phone] (home); [phone] (mobile); (Preferred) [phone] [email or web address]  [id number] - [place], [place] - [address] [place] [address] Phone: [phone] Fax: [place], [place] [address] [place] [address] Phone: [phone] Fax: [phone]    Pre-screening note:  Our staff contacted Ms. Sabra Heck and offered her an "in person", "face-to-face" appointment versus a telephone encounter. She indicated preferring the telephone encounter, at this time.   Reason for Virtual Visit: COVID-19*  Social distancing based on CDC and AMA recommendations.   I contacted Bethany Villa on 07/05/2019 via video conference.      I clearly identified myself as Gillis Santa, MD. I verified that I was speaking with the correct person using two identifiers (Name: NANEA JARED, and date of birth: 1956/07/23).  Advanced Informed Consent I sought verbal advanced consent from Bethany Villa for virtual visit interactions. I informed Ms. Sabra Heck of possible security and privacy concerns, risks, and limitations associated with providing "not-in-person" medical evaluation and management services. I also informed Ms. Sabra Heck of the availability of "in-person" appointments. Finally, I informed her that there would be a charge for the virtual visit and that she could be  personally, fully or partially, financially responsible for it. Ms. Kidney expressed understanding and agreed to proceed.   Historic Elements   Ms. PEYTIN DECHERT is a 63 y.o. year old, female patient evaluated today after her last encounter by our practice on 06/28/2019. Ms. Ruberg  has a past medical history of Anxiety, Asthma, Chronic pain,  COPD (chronic obstructive pulmonary disease) ([place]), Depression, DJD (degenerative joint disease), Fibromyalgia, GERD (gastroesophageal reflux disease), Headache, Hyperlipidemia, Lower extremity edema, On home oxygen therapy, Panic attack, Shortness of breath dyspnea, and Sleep disorder. 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 Ms. Rosen has a current medication list which includes the following prescription(s): albuterol, albuterol, atorvastatin, buprenorphine, buspirone, feeding supplement (ensure enlive), guaifenesin-dextromethorphan, magic mouthwash, nicotine, phenol, prednisone, sertraline, tizanidine, and trazodone. She  reports that she has been smoking cigarettes. She has a 45.00 pack-year smoking history. She has never used smokeless tobacco. She reports current alcohol use. She reports that she does not use drugs. Ms. Abrams is allergic to gabapentin; mobic [meloxicam]; penicillins; and sulfa antibiotics.   HPI  Today, she is being contacted for worsening of previously known (established) problem   Patient states that her buprenorphine is effective for her low back pain but that her neck pain is very severe.  She is having radiation to bilateral shoulders.  Right greater than left.  She has a history of cervical degenerative disc disease.  She does not find Robaxin very effective.  We discussed trial of tizanidine.    UDS:  Summary  Date Value Ref Range Status  05/16/2018 FINAL  Final    Comment:    ==================================================================== TOXASSURE COMP DRUG ANALYSIS,UR ==================================================================== Test                             Result       Flag       Units Drug Present and Declared for  Prescription Verification   Sertraline                      PRESENT      EXPECTED   Desmethylsertraline            PRESENT      EXPECTED    Desmethylsertraline is an expected metabolite of sertraline.   Trazodone                      PRESENT      EXPECTED   1,3 chlorophenyl piperazine    PRESENT      EXPECTED    1,3-chlorophenyl piperazine is an expected metabolite of    trazodone. Drug Present not Declared for Prescription Verification   Buprenorphine                  11           UNEXPECTED ng/mg creat   Norbuprenorphine               45           UNEXPECTED ng/mg creat    Source of buprenorphine is a scheduled prescription medication.    Norbuprenorphine is an expected metabolite of buprenorphine.   Methocarbamol                  PRESENT      UNEXPECTED Drug Absent but Declared for Prescription Verification   Tizanidine                     Not Detected UNEXPECTED    Tizanidine, as indicated in the declared medication list, is not    always detected even when used as directed. ==================================================================== Test                      Result    Flag   Units      Ref Range   Creatinine              126              mg/dL      >=41 ==================================================================== Declared Medications:  The flagging and interpretation on this report are based on the  following declared medications.  Unexpected results may arise from  inaccuracies in the declared medications.  **Note: The testing scope of this panel includes these medications:  Sertraline (Zoloft)  Trazodone  **Note: The testing scope of this panel does not include small to  moderate amounts of these reported medications:  Tizanidine  **Note: The testing scope of this panel does not include following  reported medications:  Albuterol (Proventil)  Atorvastatin (Lipitor)  Buspirone (BuSpar) ==================================================================== For clinical consultation, please call [phone]. ====================================================================    Laboratory Chemistry Profile (12 mo)  Renal: 06/13/2019: BUN 21; Creatinine, Ser 0.85  Lab Results  Component Value Date   GFRAA >60 06/13/2019   GFRNONAA >60 06/13/2019   Hepatic: 06/11/2019: Albumin 3.5 Lab Results  Component Value Date   AST 22 06/11/2019   ALT 20 06/11/2019   Other: No results found for requested labs within last 8760 hours. Note: Above Lab results reviewed.  Imaging  Last 90 days:  Dg Chest Port 1 View  Result Date: 06/10/2019 CLINICAL DATA:  Shortness of breath worsening for 3 weeks EXAM: PORTABLE CHEST 1 VIEW COMPARISON:  Radiograph 10/02/2017, chest CT 09/04/2015 FINDINGS: There is a background of chronic bronchitic changes seen on  prior studies. There is increasing hazy interstitial and reticular opacities throughout the lungs most prevalent in the lung bases with some cephalization and indistinctness of the pulmonary vascularity and few peripheral septal lines. No focal consolidative process is clearly evident. No pneumothorax or effusion. The aorta is calcified. The remaining cardiomediastinal contours are unremarkable. No acute osseous or soft tissue abnormality. Degenerative changes are present in the imaged spine and shoulders. IMPRESSION: Appearances suggestive some degree of interstitial edema superimposed on chronic bronchitic changes present on prior studies. Electronically Signed   By: Kreg ShropshirePrice  DeHay M.D.   On: 06/10/2019 23:30    Assessment  The primary encounter diagnosis was Cervicalgia. Diagnoses of Degeneration of cervical intervertebral disc and Fibromyalgia were also pertinent to this visit.  Plan of Care  I have discontinued Loranda E. Kinnick's methocarbamol. I am also having her start on tiZANidine. Additionally, I am having her maintain her atorvastatin, albuterol, sertraline, traZODone, busPIRone, buprenorphine, albuterol, magic mouthwash, predniSONE, nicotine,  phenol, feeding supplement (ENSURE ENLIVE), and guaiFENesin-dextromethorphan.  Pharmacotherapy (Medications Ordered): Meds ordered this encounter  Medications  . tiZANidine (ZANAFLEX) 4 MG tablet    Sig: Take 1 tablet (4 mg total) by mouth 2 (two) times daily as needed for muscle spasms.    Dispense:  60 tablet    Refill:  2    Do not place this medication, or any other prescription from our practice, on "Automatic Refill". Patient may have prescription filled one day early if pharmacy is closed on scheduled refill date.   Follow-up plan:   Return for Keep sch. appt.    Recent Visits Date Type Provider Dept  06/28/19 Office Visit Edward JollyLateef, Otho Michalik, MD [facility]  05/01/19 Office Visit Edward JollyLateef, Shianna Bally, MD [facility]  04/26/19 Office Visit Edward JollyLateef, Malaney Mcbean, MD [facility]  Showing recent visits within past 90 days and meeting all other requirements   Today's Visits Date Type Provider Dept  07/05/19 Office Visit Edward JollyLateef, Verta Riedlinger, MD [facility]  Showing today's visits and meeting all other requirements   Future Appointments Date Type Provider Dept  08/21/19 Appointment Edward JollyLateef, Korrey Schleicher, MD [facility]  Showing future appointments within next 90 days and meeting all other requirements   I discussed the assessment and treatment plan with the patient. The patient was provided an opportunity to ask questions and all were answered. The patient agreed with the plan and demonstrated an understanding of the instructions.  Patient advised to call back or seek an in-person evaluation if the symptoms or condition worsens.  Total duration of non-face-to-face encounter:1615minutes.  Note by: Edward JollyBilal Ygnacio Fecteau, MD Date: 07/05/2019; Time: 8:35 AM  Note: This dictation was prepared with Dragon dictation. Any transcriptional errors that may result from this process are unintentional.  Disclaimer:  * Given the special circumstances of the COVID-19 pandemic, the  federal government has announced that the Office for Civil Rights (OCR) will exercise its enforcement discretion and will not impose penalties on physicians using telehealth in the event of noncompliance with regulatory requirements under the [employer] Insurance Portability and Accountability Act (HIPAA) in connection with the good faith provision of telehealth during the COVID-19 national public health emergency. (AMA)

## 2019-07-25 ENCOUNTER — Other Ambulatory Visit: Payer: Self-pay | Admitting: Student in an Organized Health Care Education/Training Program

## 2019-08-01 ENCOUNTER — Other Ambulatory Visit: Payer: Self-pay | Admitting: Physician Assistant

## 2019-08-01 DIAGNOSIS — Z1231 Encounter for screening mammogram for malignant neoplasm of breast: Secondary | ICD-10-CM

## 2019-08-20 ENCOUNTER — Telehealth: Payer: Self-pay

## 2019-08-20 NOTE — Telephone Encounter (Signed)
Pre virtual appointment call to go over medications.  Left message to return our call.

## 2019-08-20 NOTE — Telephone Encounter (Signed)
Patient called and left vm saying she needed to cancel appointment for tomorrow. I called her back and it went to vm but the mailbox was full and couldn't leave a vm.

## 2019-08-21 ENCOUNTER — Other Ambulatory Visit: Payer: Self-pay

## 2019-08-21 ENCOUNTER — Telehealth: Payer: Self-pay | Admitting: *Deleted

## 2019-08-21 ENCOUNTER — Encounter: Payer: Self-pay | Admitting: Student in an Organized Health Care Education/Training Program

## 2019-08-21 ENCOUNTER — Ambulatory Visit
Payer: Medicare Other | Attending: Student in an Organized Health Care Education/Training Program | Admitting: Student in an Organized Health Care Education/Training Program

## 2019-08-21 DIAGNOSIS — M170 Bilateral primary osteoarthritis of knee: Secondary | ICD-10-CM

## 2019-08-21 DIAGNOSIS — M546 Pain in thoracic spine: Secondary | ICD-10-CM | POA: Diagnosis not present

## 2019-08-21 DIAGNOSIS — M797 Fibromyalgia: Secondary | ICD-10-CM | POA: Diagnosis not present

## 2019-08-21 DIAGNOSIS — G894 Chronic pain syndrome: Secondary | ICD-10-CM

## 2019-08-21 DIAGNOSIS — M5030 Other cervical disc degeneration, unspecified cervical region: Secondary | ICD-10-CM | POA: Diagnosis not present

## 2019-08-21 DIAGNOSIS — G8929 Other chronic pain: Secondary | ICD-10-CM

## 2019-08-21 DIAGNOSIS — M542 Cervicalgia: Secondary | ICD-10-CM

## 2019-08-21 MED ORDER — TIZANIDINE HCL 4 MG PO TABS: 4.0000 mg | ORAL_TABLET | Freq: Two times a day (BID) | ORAL | 2 refills | Status: DC | PRN

## 2019-08-21 MED ORDER — BUPRENORPHINE 15 MCG/HR TD PTWK: 15.0000 ug/h | MEDICATED_PATCH | TRANSDERMAL | 3 refills | Status: AC

## 2019-08-21 NOTE — Telephone Encounter (Signed)
Patient would like refill on Tizadine. thanks

## 2019-08-21 NOTE — Progress Notes (Addendum)
Virtual Encounter - Pain Management PROVIDER NOTE: Information contained herein reflects review and annotations entered in association with encounter. Patient information is provided elsewhere in the medical record. Interpretation of information and data should be left to medically trained personnel. Document created using STT technology, any transcriptional errors that may result from process are unintentional.  Contact & Pharmacy Preferred: [phone] Home: [phone] (home) Mobile: [phone] (mobile) E-mail: [phone] [email or web address]  [facility] [address] [place] - [address] [place] [address] Phone: [phone] Fax: [phone]  [facility] - [place], [place] - [address] [place] [address] Phone: [phone] Fax: [phone]   Pre-screening  Ms. Hyacinth Meeker offered "in-person" vs "virtual" encounter. She indicated preferring virtual for this encounter.   Reason COVID-19*  Social distancing based on CDC and AMA recommendations.   I contacted [name] on 08/21/2019 via video conference.      I clearly identified myself as Edward Jolly, MD. I verified that I was speaking with the correct person using two identifiers (Name: MERTIS MOSHER, and date of birth: June 25, 1956).  Consent I sought verbal advanced consent from [name] for virtual visit interactions. I informed Ms. Hyacinth Meeker of possible security and privacy concerns, risks, and limitations associated with providing "not-in-person" medical evaluation and management services. I also informed Ms. Hyacinth Meeker of the availability of "in-person" appointments. Finally, I informed her that there would be a charge for the virtual visit and that she could be  personally, fully or partially, financially responsible for it. Ms. Milner expressed understanding and agreed to proceed.   Historic Elements   Ms. MASHANDA ISHIBASHI is a 63 y.o. year old, female patient evaluated  today after her last encounter by our practice on 08/20/2019. Ms. Dimauro  has a past medical history of Anxiety, Asthma, Chronic pain, COPD (chronic obstructive pulmonary disease) (HCC), Depression, DJD (degenerative joint disease), Fibromyalgia, GERD (gastroesophageal reflux disease), Headache, Hyperlipidemia, Lower extremity edema, On home oxygen therapy, Panic attack, Shortness of breath dyspnea, and Sleep disorder. 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 Ms. Ratliff has a current medication list which includes the following prescription(s): albuterol, albuterol, atorvastatin, buprenorphine, buspirone, feeding supplement (ensure enlive), guaifenesin-dextromethorphan, magic mouthwash, nicotine, phenol, prednisone, sertraline, tizanidine, and trazodone. She  reports that she has been smoking cigarettes. She has a 45.00 pack-year smoking history. She has never used smokeless tobacco. She reports current alcohol use. She reports that she does not use drugs. Ms. Bertini is allergic to gabapentin; mobic [meloxicam]; penicillins; and sulfa antibiotics.   HPI  Today, she is being contacted for medication management.   No change in medical history since last visit.  Patient's pain is at baseline although having more neck pain due to cervical DDD. She states that it does get worse in the winter months.    Patient continues multimodal pain regimen as prescribed.  States that it provides pain relief and improvement in functional status.   Pharmacotherapy Assessment  Analgesic: Butrans patch 15 mcg/hr  Monitoring: Pharmacotherapy: No side-effects or adverse reactions reported. [place] PMP: PDMP reviewed during this encounter.       Compliance: No problems identified. Effectiveness: Clinically acceptable. Plan: Refer to  "POC".  UDS:  Summary  Date Value Ref Range Status  05/16/2018 FINAL  Final    Comment:    ==================================================================== TOXASSURE COMP DRUG  ANALYSIS,UR ==================================================================== Test                             Result       Flag       Units Drug Present and Declared for Prescription Verification   Sertraline                     PRESENT      EXPECTED   Desmethylsertraline            PRESENT      EXPECTED    Desmethylsertraline is an expected metabolite of sertraline.   Trazodone                      PRESENT      EXPECTED   1,3 chlorophenyl piperazine    PRESENT      EXPECTED    1,3-chlorophenyl piperazine is an expected metabolite of    trazodone. Drug Present not Declared for Prescription Verification   Buprenorphine                  11           UNEXPECTED ng/mg creat   Norbuprenorphine               45           UNEXPECTED ng/mg creat    Source of buprenorphine is a scheduled prescription medication.    Norbuprenorphine is an expected metabolite of buprenorphine.   Methocarbamol                  PRESENT      UNEXPECTED Drug Absent but Declared for Prescription Verification   Tizanidine                     Not Detected UNEXPECTED    Tizanidine, as indicated in the declared medication list, is not    always detected even when used as directed. ==================================================================== Test                      Result    Flag   Units      Ref Range   Creatinine              126              mg/dL      >=16 ==================================================================== Declared Medications:  The flagging and interpretation on this report are based on the  following declared medications.  Unexpected results may arise from  inaccuracies in the declared medications.  **Note: The testing scope of this panel includes these medications:  Sertraline (Zoloft)  Trazodone   **Note: The testing scope of this panel does not include small to  moderate amounts of these reported medications:  Tizanidine  **Note: The testing scope of this panel does not include following  reported medications:  Albuterol (Proventil)  Atorvastatin (Lipitor)  Buspirone (BuSpar) ==================================================================== For clinical consultation, please call [phone]. ====================================================================    Laboratory Chemistry Profile (12 mo)  Renal: 06/13/2019: BUN 21; Creatinine, Ser 0.85  Lab Results  Component Value Date   GFRAA >60 06/13/2019   GFRNONAA >60 06/13/2019   Hepatic: 06/11/2019: Albumin 3.5 Lab Results  Component Value Date   AST 22 06/11/2019   ALT 20 06/11/2019   Other: No results found for requested labs within last 8760 hours. Note: Above Lab results  reviewed.  Imaging  ECHOCARDIOGRAM COMPLETE   ECHOCARDIOGRAM REPORT       Patient Name:   ILDA LASKIN Date of Exam: 06/11/2019 Medical Rec #:  [id number]      Height:       60.0 in Accession #:    [id number]     Weight:       170.0 lb Date of Birth:  1956/05/22      BSA:          1.74 m Patient Age:    63 years       BP:           99/59 mmHg Patient Gender: F              HR:           76 bpm. Exam Location:  [facility]  Procedure: 2D Echo, Color Doppler and Cardiac Doppler  Indications:     I50.31 CHF-Acute Diastolic   History:         Patient has prior history of Echocardiogram examinations. COPD                  Signs/Symptoms:Shortness of Breath.   Sonographer:     Humphrey Rolls RDCS (AE) Referring Phys:  [id number] Vernetta Honey MANSY Diagnosing Phys: Harold Hedge MD    Sonographer Comments: Suboptimal parasternal window. IMPRESSIONS   1. Left ventricular ejection fraction, by visual estimation, is 40 to 45%. The left ventricle has mildly decreased function. Left ventricular septal wall thickness was mildly increased. Mildly increased  left ventricular posterior wall thickness. There  is borderline left ventricular hypertrophy.  2. Left ventricular diastolic Doppler parameters are consistent with impaired relaxation pattern of LV diastolic filling.  3. The left ventricle was not well visualized.  4. Global right ventricle has normal systolic function.The right ventricular size is mildly enlarged. No increase in right ventricular wall thickness.  5. Left atrial size was normal.  6. Right atrial size was mildly dilated.  7. The mitral valve was not well visualized. Trace mitral valve regurgitation.  8. The tricuspid valve is grossly normal. Tricuspid valve regurgitation is trivial.  9. The aortic valve was not well visualized Aortic valve regurgitation was not visualized by color flow Doppler. 10. The pulmonic valve was not well visualized. Pulmonic valve regurgitation is trivial by color flow Doppler. 11. The aortic root was not well visualized. 12. The atrial septum is grossly normal.  FINDINGS  Left Ventricle: Left ventricular ejection fraction, by visual estimation, is 40 to 45%. The left ventricle has mildly decreased function. The left ventricle was not well visualized. Left ventricular septal wall thickness was mildly increased. Mildly  increased left ventricular posterior wall thickness. There is borderline left ventricular hypertrophy. Spectral Doppler shows Left ventricular diastolic Doppler parameters are consistent with impaired relaxation pattern of LV diastolic filling.  Right Ventricle: The right ventricular size is mildly enlarged. No increase in right ventricular wall thickness. Global RV systolic function is has normal systolic function.  Left Atrium: Left atrial size was normal in size.  Right Atrium: Right atrial size was mildly dilated  Pericardium: There is no evidence of pericardial effusion.  Mitral Valve: The mitral valve was not well visualized. MV peak gradient, 7.0 mmHg. Trace mitral valve  regurgitation.  Tricuspid Valve: The tricuspid valve is grossly normal. Tricuspid valve regurgitation is trivial by color flow Doppler.  Aortic Valve: The aortic valve was not well visualized. Aortic valve regurgitation was not visualized by color flow  Doppler. Aortic valve mean gradient measures 5.0 mmHg. Aortic valve peak gradient measures 11.0 mmHg. Aortic valve area, by VTI measures  2.36 cm.  Pulmonic Valve: The pulmonic valve was not well visualized. Pulmonic valve regurgitation is trivial by color flow Doppler.  Aorta: The aortic root was not well visualized.  IAS/Shunts: The atrial septum is grossly normal.    LEFT VENTRICLE PLAX 2D LVIDd:         2.65 cm       Diastology LVIDs:         2.10 cm       LV e' lateral:   6.64 cm/s LV PW:         1.00 cm       LV E/e' lateral: 14.8 LV IVS:        1.07 cm       LV e' medial:    6.20 cm/s LVOT diam:     2.10 cm       LV E/e' medial:  15.9 LV SV:         11 ml LV SV Index:   6.18 LVOT Area:     3.46 cm   LV Volumes (MOD) LV area d, A2C:    20.50 cm LV area d, A4C:    22.70 cm LV area s, A2C:    10.70 cm LV area s, A4C:    12.10 cm LV major d, A2C:   7.85 cm LV major d, A4C:   7.25 cm LV major s, A2C:   5.90 cm LV major s, A4C:   5.81 cm LV vol d, MOD A2C: 44.4 ml LV vol d, MOD A4C: 58.3 ml LV vol s, MOD A2C: 17.7 ml LV vol s, MOD A4C: 20.9 ml LV SV MOD A2C:     26.7 ml LV SV MOD A4C:     58.3 ml LV SV MOD BP:      33.7 ml  RIGHT VENTRICLE RV Basal diam:  3.23 cm  LEFT ATRIUM             Index       RIGHT ATRIUM           Index LA diam:        2.90 cm 1.66 cm/m  RA Area:     13.20 cm LA Vol (A2C):   26.0 ml 14.93 ml/m RA Volume:   30.80 ml  17.68 ml/m LA Vol (A4C):   27.2 ml 15.61 ml/m LA Biplane Vol: 27.0 ml 15.50 ml/m  AORTIC VALVE AV Area (Vmax):    2.50 cm AV Area (Vmean):   2.64 cm AV Area (VTI):     2.36 cm AV Vmax:           166.00 cm/s AV Vmean:          107.000 cm/s AV VTI:             0.334 m AV Peak Grad:      11.0 mmHg AV Mean Grad:      5.0 mmHg LVOT Vmax:         120.00 cm/s LVOT Vmean:        81.500 cm/s LVOT VTI:          0.228 m LVOT/AV VTI ratio: 0.68   AORTA Ao Root diam: 2.90 cm  MITRAL VALVE MV Area (PHT): 2.48 cm              SHUNTS MV Peak grad:  7.0 mmHg  Systemic VTI:  0.23 m MV Mean grad:  3.0 mmHg              Systemic Diam: 2.10 cm MV Vmax:       1.32 m/s MV Vmean:      81.8 cm/s MV VTI:        0.37 m MV PHT:        88.74 msec MV Decel Time: 306 msec MV E velocity: 98.50 cm/s  103 cm/s MV A velocity: 131.00 cm/s 70.3 cm/s MV E/A ratio:  0.75        1.5    Bartholome Bill MD Electronically signed by Bartholome Bill MD Signature Date/Time: 06/14/2019/1:14:12 PM      Final     Assessment  The primary encounter diagnosis was Cervicalgia. Diagnoses of Degeneration of cervical intervertebral disc, Fibromyalgia, Bilateral primary osteoarthritis of knee, Chronic bilateral thoracic back pain, and Chronic pain syndrome were also pertinent to this visit.  Plan of Care  I have discontinued Maryjane E. Bogie's buprenorphine. I am also having her start on Buprenorphine. Additionally, I am having her maintain her atorvastatin, albuterol, sertraline, traZODone, busPIRone, albuterol, magic mouthwash, predniSONE, nicotine, phenol, feeding supplement (ENSURE ENLIVE), guaiFENesin-dextromethorphan, and tiZANidine.  Pharmacotherapy (Medications Ordered): Meds ordered this encounter  Medications  . Buprenorphine 15 MCG/HR PTWK    Sig: Place 15 mcg/hr onto the skin every 7 (seven) days. For chronic pain syndrome    Dispense:  4 patch    Refill:  3    Do not place this medication, or any other prescription from our practice, on "Automatic Refill". Patient may have prescription filled one day early if pharmacy is closed on scheduled refill date.  Marland Kitchen tiZANidine (ZANAFLEX) 4 MG tablet    Sig: Take 1 tablet (4 mg total) by mouth 2 (two) times daily as  needed for muscle spasms.    Dispense:  60 tablet    Refill:  2    Do not place this medication, or any other prescription from our practice, on "Automatic Refill". Patient may have prescription filled one day early if pharmacy is closed on scheduled refill date.   Orders:  Orders Placed This Encounter  Procedures  . UDS (Compliance-13) ([facility]) ([facility]) (Established Pt.)    Volume: 30 ml(s). Minimum 3 ml of urine is needed. Document temperature of fresh sample. Indications: Long term (current) use of opiate analgesic (C16.606)   Follow-up plan:   Return in about 4 months (around 12/20/2019) for Medication Management.    Recent Visits Date Type Provider Dept  07/05/19 Office Visit Gillis Santa, MD [facility]  06/28/19 Office Visit Gillis Santa, MD [facility]  Showing recent visits within past 90 days and meeting all other requirements   Today's Visits Date Type Provider Dept  08/21/19 Office Visit Gillis Santa, MD [facility]  Showing today's visits and meeting all other requirements   Future Appointments No visits were found meeting these conditions.  Showing future appointments within next 90 days and meeting all other requirements   I discussed the assessment and treatment plan with the patient. The patient was provided an opportunity to ask questions and all were answered. The patient agreed with the plan and demonstrated an understanding of the instructions.  Patient advised to call back or seek an in-person evaluation if the symptoms or condition worsens.  Total duration of non-face-to-face encounter: 25 minutes.  Note by: Gillis Santa, MD Date: 08/21/2019; Time: 12:34 PM

## 2019-08-21 NOTE — Addendum Note (Signed)
Addended by: Gillis Santa on: 08/21/2019 12:34 PM   Modules accepted: Orders

## 2019-09-21 ENCOUNTER — Other Ambulatory Visit: Payer: Self-pay

## 2019-09-21 ENCOUNTER — Emergency Department: Payer: Medicare Other

## 2019-09-21 ENCOUNTER — Encounter: Payer: Self-pay | Admitting: Emergency Medicine

## 2019-09-21 ENCOUNTER — Inpatient Hospital Stay
Admission: EM | Admit: 2019-09-21 | Discharge: 2019-09-25 | DRG: 190 | Disposition: A | Discharge status: Home / Self Care | Payer: Medicare Other | Attending: Internal Medicine | Admitting: Internal Medicine

## 2019-09-21 DIAGNOSIS — E873 Alkalosis: Secondary | ICD-10-CM | POA: Diagnosis present

## 2019-09-21 DIAGNOSIS — I5022 Chronic systolic (congestive) heart failure: Secondary | ICD-10-CM | POA: Diagnosis present

## 2019-09-21 DIAGNOSIS — Z886 Allergy status to analgesic agent status: Secondary | ICD-10-CM

## 2019-09-21 DIAGNOSIS — J9622 Acute and chronic respiratory failure with hypercapnia: Secondary | ICD-10-CM

## 2019-09-21 DIAGNOSIS — G894 Chronic pain syndrome: Secondary | ICD-10-CM | POA: Diagnosis present

## 2019-09-21 DIAGNOSIS — Z6830 Body mass index (BMI) 30.0-30.9, adult: Secondary | ICD-10-CM | POA: Diagnosis not present

## 2019-09-21 DIAGNOSIS — E669 Obesity, unspecified: Secondary | ICD-10-CM | POA: Diagnosis present

## 2019-09-21 DIAGNOSIS — I110 Hypertensive heart disease with heart failure: Secondary | ICD-10-CM | POA: Diagnosis present

## 2019-09-21 DIAGNOSIS — Z9981 Dependence on supplemental oxygen: Secondary | ICD-10-CM

## 2019-09-21 DIAGNOSIS — G8929 Other chronic pain: Secondary | ICD-10-CM | POA: Diagnosis present

## 2019-09-21 DIAGNOSIS — Z880 Allergy status to penicillin: Secondary | ICD-10-CM | POA: Diagnosis not present

## 2019-09-21 DIAGNOSIS — G43909 Migraine, unspecified, not intractable, without status migrainosus: Secondary | ICD-10-CM | POA: Diagnosis present

## 2019-09-21 DIAGNOSIS — M797 Fibromyalgia: Secondary | ICD-10-CM | POA: Diagnosis present

## 2019-09-21 DIAGNOSIS — E785 Hyperlipidemia, unspecified: Secondary | ICD-10-CM | POA: Diagnosis present

## 2019-09-21 DIAGNOSIS — F329 Major depressive disorder, single episode, unspecified: Secondary | ICD-10-CM | POA: Diagnosis present

## 2019-09-21 DIAGNOSIS — J9620 Acute and chronic respiratory failure, unspecified whether with hypoxia or hypercapnia: Secondary | ICD-10-CM

## 2019-09-21 DIAGNOSIS — J441 Chronic obstructive pulmonary disease with (acute) exacerbation: Secondary | ICD-10-CM | POA: Diagnosis present

## 2019-09-21 DIAGNOSIS — Z96652 Presence of left artificial knee joint: Secondary | ICD-10-CM | POA: Diagnosis present

## 2019-09-21 DIAGNOSIS — J9621 Acute and chronic respiratory failure with hypoxia: Secondary | ICD-10-CM | POA: Diagnosis present

## 2019-09-21 DIAGNOSIS — Z20822 Contact with and (suspected) exposure to covid-19: Secondary | ICD-10-CM | POA: Diagnosis present

## 2019-09-21 DIAGNOSIS — F410 Panic disorder [episodic paroxysmal anxiety] without agoraphobia: Secondary | ICD-10-CM | POA: Diagnosis present

## 2019-09-21 DIAGNOSIS — Z882 Allergy status to sulfonamides status: Secondary | ICD-10-CM

## 2019-09-21 DIAGNOSIS — F17210 Nicotine dependence, cigarettes, uncomplicated: Secondary | ICD-10-CM | POA: Diagnosis present

## 2019-09-21 LAB — CBC WITH DIFFERENTIAL/PLATELET
Abs Immature Granulocytes: 0.02 10*3/uL (ref 0.00–0.07)
Basophils Absolute: 0.1 10*3/uL (ref 0.0–0.1)
Basophils Relative: 1 %
Eosinophils Absolute: 0.3 10*3/uL (ref 0.0–0.5)
Eosinophils Relative: 4 %
HCT: 49.7 % — ABNORMAL HIGH (ref 36.0–46.0)
Hemoglobin: 15.4 g/dL — ABNORMAL HIGH (ref 12.0–15.0)
Immature Granulocytes: 0 %
Lymphocytes Relative: 31 %
Lymphs Abs: 2.1 10*3/uL (ref 0.7–4.0)
MCH: 29.6 pg (ref 26.0–34.0)
MCHC: 31 g/dL (ref 30.0–36.0)
MCV: 95.6 fL (ref 80.0–100.0)
Monocytes Absolute: 0.6 10*3/uL (ref 0.1–1.0)
Monocytes Relative: 9 %
Neutro Abs: 3.8 10*3/uL (ref 1.7–7.7)
Neutrophils Relative %: 55 %
Platelets: 128 10*3/uL — ABNORMAL LOW (ref 150–400)
RBC: 5.2 MIL/uL — ABNORMAL HIGH (ref 3.87–5.11)
RDW: 13.5 % (ref 11.5–15.5)
WBC: 6.9 10*3/uL (ref 4.0–10.5)
nRBC: 0 % (ref 0.0–0.2)

## 2019-09-21 LAB — POC SARS CORONAVIRUS 2 AG: SARS Coronavirus 2 Ag: NEGATIVE

## 2019-09-21 LAB — BLOOD GAS, VENOUS
Acid-Base Excess: 12.8 mmol/L — ABNORMAL HIGH (ref 0.0–2.0)
Bicarbonate: 44.7 mmol/L — ABNORMAL HIGH (ref 20.0–28.0)
O2 Saturation: 91.8 %
Patient temperature: 37
pCO2, Ven: 102 mmHg (ref 44.0–60.0)
pH, Ven: 7.25 (ref 7.250–7.430)
pO2, Ven: 73 mmHg — ABNORMAL HIGH (ref 32.0–45.0)

## 2019-09-21 LAB — BASIC METABOLIC PANEL
Anion gap: 10 (ref 5–15)
BUN: 8 mg/dL (ref 8–23)
CO2: 34 mmol/L — ABNORMAL HIGH (ref 22–32)
Calcium: 9.3 mg/dL (ref 8.9–10.3)
Chloride: 95 mmol/L — ABNORMAL LOW (ref 98–111)
Creatinine, Ser: 0.59 mg/dL (ref 0.44–1.00)
GFR calc Af Amer: >60 mL/min (ref >60)
GFR calc non Af Amer: >60 mL/min (ref >60)
Glucose, Bld: 89 mg/dL (ref 70–99)
Potassium: 4.1 mmol/L (ref 3.5–5.1)
Sodium: 139 mmol/L (ref 135–145)

## 2019-09-21 LAB — TROPONIN I (HIGH SENSITIVITY)
Troponin I (High Sensitivity): 4 ng/L (ref <18)
Troponin I (High Sensitivity): 5 ng/L (ref <18)

## 2019-09-21 MED ORDER — IPRATROPIUM-ALBUTEROL 0.5-2.5 (3) MG/3ML IN SOLN
3.0000 mL | Freq: Once | RESPIRATORY_TRACT | Status: AC
  Administered 2019-09-21: 3 mL via RESPIRATORY_TRACT
  Filled 2019-09-21: qty 3

## 2019-09-21 MED ORDER — SODIUM CHLORIDE 0.45 % IV SOLN
INTRAVENOUS | Status: DC
  Administered 2019-09-22: 15:00:00 1000 mL via INTRAVENOUS

## 2019-09-21 MED ORDER — ALBUTEROL (5 MG/ML) CONTINUOUS INHALATION SOLN
10.0000 mg/h | INHALATION_SOLUTION | Freq: Once | RESPIRATORY_TRACT | Status: DC
  Filled 2019-09-21: qty 20

## 2019-09-21 MED ORDER — IPRATROPIUM-ALBUTEROL 0.5-2.5 (3) MG/3ML IN SOLN
3.0000 mL | Freq: Four times a day (QID) | RESPIRATORY_TRACT | Status: DC
  Administered 2019-09-22 – 2019-09-25 (×14): 3 mL via RESPIRATORY_TRACT
  Filled 2019-09-21 (×11): qty 3

## 2019-09-21 MED ORDER — ACETAMINOPHEN 325 MG PO TABS: 650.0000 mg | ORAL_TABLET | Freq: Once | ORAL | Status: DC

## 2019-09-21 MED ORDER — MAGNESIUM SULFATE 2 GM/50ML IV SOLN
2.0000 g | Freq: Once | INTRAVENOUS | Status: AC
  Administered 2019-09-22: 04:00:00 2 g via INTRAVENOUS
  Filled 2019-09-21: qty 50

## 2019-09-21 MED ORDER — METHYLPREDNISOLONE SODIUM SUCC 125 MG IJ SOLR
125.0000 mg | Freq: Once | INTRAMUSCULAR | Status: AC
  Administered 2019-09-21: 125 mg via INTRAVENOUS
  Filled 2019-09-21: qty 2

## 2019-09-21 MED ORDER — ALBUTEROL SULFATE (2.5 MG/3ML) 0.083% IN NEBU: 2.5000 mg | INHALATION_SOLUTION | RESPIRATORY_TRACT | Status: DC | PRN

## 2019-09-21 MED ORDER — IPRATROPIUM-ALBUTEROL 0.5-2.5 (3) MG/3ML IN SOLN
3.0000 mL | Freq: Once | RESPIRATORY_TRACT | Status: AC
  Administered 2019-09-21: 3 mL via RESPIRATORY_TRACT

## 2019-09-21 MED ORDER — SODIUM CHLORIDE 0.9 % IV SOLN
500.0000 mg | INTRAVENOUS | Status: AC
  Administered 2019-09-22: 04:00:00 500 mg via INTRAVENOUS
  Filled 2019-09-21: qty 500

## 2019-09-21 MED ORDER — AZITHROMYCIN 500 MG PO TABS
500.0000 mg | ORAL_TABLET | Freq: Every day | ORAL | Status: DC
  Administered 2019-09-23 – 2019-09-25 (×3): 500 mg via ORAL
  Filled 2019-09-21 (×4): qty 1

## 2019-09-21 MED ORDER — ENOXAPARIN SODIUM 40 MG/0.4ML ~~LOC~~ SOLN
40.0000 mg | SUBCUTANEOUS | Status: DC
  Administered 2019-09-22 – 2019-09-25 (×3): 40 mg via SUBCUTANEOUS
  Filled 2019-09-21 (×3): qty 0.4

## 2019-09-21 MED ORDER — IPRATROPIUM BROMIDE 0.02 % IN SOLN
1.0000 mg | Freq: Once | RESPIRATORY_TRACT | Status: AC
  Administered 2019-09-21: 1 mg via RESPIRATORY_TRACT
  Filled 2019-09-21: qty 5

## 2019-09-21 MED ORDER — PREDNISONE 20 MG PO TABS
40.0000 mg | ORAL_TABLET | Freq: Every day | ORAL | Status: DC
  Administered 2019-09-23 – 2019-09-25 (×3): 40 mg via ORAL
  Filled 2019-09-21: qty 4
  Filled 2019-09-21 (×3): qty 2
  Filled 2019-09-21: qty 4

## 2019-09-21 MED ORDER — METHYLPREDNISOLONE SODIUM SUCC 125 MG IJ SOLR
60.0000 mg | Freq: Four times a day (QID) | INTRAMUSCULAR | Status: AC
  Administered 2019-09-22 (×3): 60 mg via INTRAVENOUS
  Filled 2019-09-21 (×2): qty 2

## 2019-09-21 NOTE — ED Notes (Signed)
Pt assisted to bedside commode, upon walking back pt becomes audibly wheezy.

## 2019-09-21 NOTE — ED Notes (Addendum)
Pt states having cough, possible fever, diarrhea, and nausea last week. Pt states other symptoms are no longer present but that she feels shob with any exertion.

## 2019-09-21 NOTE — ED Provider Notes (Signed)
[facility] Emergency Department Provider Note  ____________________________________________   First MD Initiated Contact with Patient 09/21/19 2112     (approximate)  I have reviewed the triage vital signs and the nursing notes.   HISTORY  Chief Complaint Shortness of Breath    HPI Bethany Villa is a 64 y.o. female with past medical history of COPD on chronic oxygen, hypertension, hyperlipidemia, anxiety, here with multiple complaints.  Patient states that for the last week, she has had progressively worsening cough, shortness of breath, as well as anxiety.  She states that every time she tries to walk across the room, she feels incredibly dyspneic.  She feels lightheaded.  She then becomes anxious about not being out of breathe, and states she had associated diarrhea and occasional increased urinary frequency, which is happened with her anxiety in the past.  She states her shortness of breath is progressively worsening.  She had not used her albuterol every hour around-the-clock.  She states that she has had to increase her oxygen at home.  She states she has had similar symptoms during her COPD exacerbation in the past.  No known caronavirus exposures.  No fevers.        Past Medical History:  Diagnosis Date  . Anxiety   . Asthma   . Chronic pain    lower back  . Chronic systolic CHF (congestive heart failure) (HCC) 09/22/2019  . COPD (chronic obstructive pulmonary disease) (HCC)   . Depression   . DJD (degenerative joint disease)   . Fibromyalgia   . GERD (gastroesophageal reflux disease)   . Headache   . Hyperlipidemia   . Lower extremity edema   . On home oxygen therapy    has not been on since 2016  . Panic attack   . Shortness of breath dyspnea   . Sleep disorder     Patient Active Problem List   Diagnosis Date Noted  . Chronic systolic CHF (congestive heart failure) (HCC) 09/22/2019  . Degeneration of cervical intervertebral disc  07/05/2019  . Bilateral primary osteoarthritis of knee 04/26/2019  . Chronic bilateral thoracic back pain 12/05/2018  . Cervicalgia 12/05/2018  . 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 Acute adjustment disorder with anxiety 10/06/2017  . Palliative care by specialist   . Respiratory failure (HCC) 10/01/2017  . COPD exacerbation (HCC) 09/01/2016  . Elevated brain natriuretic peptide (BNP) level 09/01/2016  . LFT elevation 09/01/2016  . 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 Chronic obstructive pulmonary disease with acute lower respiratory infection (HCC)   . Acute respiratory failure with hypoxia and hypercapnia (HCC)   . Sepsis (HCC) 09/03/2015  . CAP (community acquired pneumonia) 09/01/2015  . SIRS (systemic inflammatory response syndrome) (HCC) 07/24/2014  . COPD with acute exacerbation (HCC) 09/29/2013  . Acute on chronic respiratory failure with hypercapnia (HCC) 11/27/2012  . Insomnia 11/04/2012  . Generalized anxiety disorder 11/04/2012  . Depression 11/04/2012  . Fibromyalgia 11/04/2012  . Metabolic encephalopathy 10/03/2011  . COPD (chronic obstructive pulmonary disease) (HCC) 06/01/2011  . Smoking 05/19/2011  . Obesity 05/19/2011  . Hyperglycemia 05/19/2011    Past Surgical History:  Procedure Laterality Date  . ABDOMINAL SURGERY    . ANKLE SURGERY    . BACK SURGERY     lower  . CARPAL TUNNEL RELEASE    . CESAREAN SECTION    . KNEE SURGERY    . TONSILLECTOMY    . TOTAL KNEE  ARTHROPLASTY Left 04/14/2016   Procedure: TOTAL KNEE ARTHROPLASTY;  Surgeon: Deeann Saint, MD;  Location: [facility] ORS;  Service: Orthopedics;  Laterality: Left;  . TUBAL LIGATION      Prior to Admission medications   Medication Sig Start Date End Date Taking? Authorizing Provider  albuterol (PROVENTIL) (2.5 MG/3ML) 0.083% nebulizer solution TAKE 3 ML BY NEBULIZATION Q 6 hrs prn FOR WHEEZING OR SOB Dx J44.9 10/06/17   Enedina Finner, MD  albuterol (VENTOLIN  HFA) 108 (90 Base) MCG/ACT inhaler INHALE 2 PUFFS BY MOUTH EVERY 6 HOURS AS NEEDED FOR WHEEZE 10/20/18   [provider]  atorvastatin (LIPITOR) 20 MG tablet TAKE 1 TABLET (20 MG TOTAL) BY MOUTH DAILY. 04/29/16   Daphine Deutscher, Mary-Margaret, FNP  busPIRone (BUSPAR) 5 MG tablet Take 5 mg by mouth 2 (two) times daily.     [provider]  feeding supplement, ENSURE ENLIVE, (ENSURE ENLIVE) LIQD Take 237 mLs by mouth 2 (two) times daily between meals. 06/13/19   Delfino Lovett, MD  guaiFENesin-dextromethorphan (ROBITUSSIN DM) 100-10 MG/5ML syrup Take 5 mLs by mouth every 4 (four) hours as needed for cough. 06/13/19   Delfino Lovett, MD  magic mouthwash SOLN Take 5 mLs by mouth 3 (three) times daily as needed for mouth pain. 06/13/19   Delfino Lovett, MD  nicotine (NICODERM CQ - DOSED IN MG/24 HOURS) 21 mg/24hr patch Place 1 patch (21 mg total) onto the skin daily. 06/13/19   Delfino Lovett, MD  phenol (CHLORASEPTIC) 1.4 % LIQD Use as directed 1 spray in the mouth or throat as needed for throat irritation / pain. 06/13/19   Delfino Lovett, MD  predniSONE (STERAPRED UNI-PAK 21 TAB) 10 MG (21) TBPK tablet Start 60 mg po daily, taper 10 mg daily until done 06/13/19   Delfino Lovett, MD  sertraline (ZOLOFT) 50 MG tablet Take 1 tablet (50 mg total) by mouth daily. Patient taking differently: Take 100 mg by mouth daily.  10/08/17   Enedina Finner, MD  tiZANidine (ZANAFLEX) 4 MG tablet Take 1 tablet (4 mg total) by mouth 2 (two) times daily as needed for muscle spasms. 08/21/19 11/19/19  Edward Jolly, MD  traZODone (DESYREL) 100 MG tablet Take 1 tablet (100 mg total) by mouth at bedtime. 10/07/17   Enedina Finner, MD    Allergies Gabapentin, Mobic [meloxicam], Penicillins, and Sulfa antibiotics  Family History  Problem Relation Age of Onset  . Cancer Mother        beast    Social History Social History   Tobacco Use  . Smoking status: Current Every Day Smoker    Packs/day: 1.00    Years: 45.00    Pack years: 45.00     Types: Cigarettes  . Smokeless tobacco: Never Used  Substance Use Topics  . Alcohol use: Yes    Alcohol/week: 0.0 standard drinks    Comment: rare  . Drug use: No    Review of Systems  Review of Systems  Constitutional: Positive for fatigue and fever.  HENT: Negative for congestion and sore throat.   Eyes: Negative for visual disturbance.  Respiratory: Positive for cough, shortness of breath and wheezing.   Cardiovascular: Negative for chest pain.  Gastrointestinal: Negative for abdominal pain, diarrhea, nausea and vomiting.  Genitourinary: Negative for flank pain.  Musculoskeletal: Negative for back pain and neck pain.  Skin: Negative for rash and wound.  Neurological: Positive for weakness.  All other systems reviewed and are negative.    ____________________________________________  PHYSICAL EXAM:  VITAL SIGNS: ED Triage Vitals  Enc Vitals Group     BP 09/21/19 1806 (!) 145/89     Pulse Rate 09/21/19 1804 79     Resp 09/21/19 1804 16     Temp 09/21/19 1804 98.3 F (36.8 C)     Temp Source 09/21/19 1804 Oral     SpO2 09/21/19 1804 95 %     Weight 09/21/19 1806 170 lb (77.1 kg)     Height 09/21/19 1806 5\' 3"  (1.6 m)     Head Circumference --      Peak Flow --      Pain Score 09/21/19 1806 8     Pain Loc --      Pain Edu? --      Excl. in GC? --      Physical Exam Vitals and nursing note reviewed.  Constitutional:      General: She is not in acute distress.    Appearance: She is well-developed.  HENT:     Head: Normocephalic and atraumatic.  Eyes:     Conjunctiva/sclera: Conjunctivae normal.  Cardiovascular:     Rate and Rhythm: Normal rate and regular rhythm.     Heart sounds: Normal heart sounds. No murmur. No friction rub.  Pulmonary:     Effort: Tachypnea, accessory muscle usage and respiratory distress present.     Breath sounds: Examination of the right-upper field reveals wheezing. Examination of the left-upper field reveals wheezing.  Examination of the right-middle field reveals wheezing. Examination of the left-middle field reveals wheezing. Examination of the right-lower field reveals wheezing. Examination of the left-lower field reveals wheezing. Decreased breath sounds and wheezing present. No rales.  Abdominal:     General: There is no distension.     Palpations: Abdomen is soft.     Tenderness: There is no abdominal tenderness.  Musculoskeletal:     Cervical back: Neck supple.  Skin:    General: Skin is warm.     Capillary Refill: Capillary refill takes less than 2 seconds.     Findings: No rash.  Neurological:     Mental Status: She is alert and oriented to person, place, and time.     Motor: No abnormal muscle tone.       ____________________________________________   LABS (all labs ordered are listed, but only abnormal results are displayed)  Labs Reviewed  CBC WITH DIFFERENTIAL/PLATELET - Abnormal; Notable for the following components:      Result Value   RBC 5.20 (*)    Hemoglobin 15.4 (*)    HCT 49.7 (*)    Platelets 128 (*)    All other components within normal limits  BASIC METABOLIC PANEL - Abnormal; Notable for the following components:   Chloride 95 (*)    CO2 34 (*)    All other components within normal limits  BLOOD GAS, VENOUS - Abnormal; Notable for the following components:   pCO2, Ven 102 (*)    pO2, Ven 73.0 (*)    Bicarbonate 44.7 (*)    Acid-Base Excess 12.8 (*)    All other components within normal limits  BRAIN NATRIURETIC PEPTIDE  HIV ANTIBODY (ROUTINE TESTING W REFLEX)  CBC  POC SARS CORONAVIRUS 2 AG  POC SARS CORONAVIRUS 2 AG -  ED  TROPONIN I (HIGH SENSITIVITY)  TROPONIN I (HIGH SENSITIVITY)    ____________________________________________  EKG: Normal sinus rhythm, ventricular rate 80.  PR 136, QRS 126, QTc 463.  Left anterior fascicular block.  Right bundle branch block.  No ST elevations or  depressions. ________________________________________  RADIOLOGY All imaging, including plain films, CT scans, and ultrasounds, independently reviewed by me, and interpretations confirmed via formal radiology reads.  ED MD interpretation:   Chest x-ray: No acute abnormality  Official radiology report(s): DG Chest 2 View  Result Date: 09/21/2019 CLINICAL DATA:  64 year old female with shortness of breath EXAM: CHEST - 2 VIEW COMPARISON:  Chest radiograph dated 06/10/2019 FINDINGS: There is background of emphysema and chronic interstitial coarsening. No focal consolidation, pleural effusion, pneumothorax. The cardiac silhouette is within normal limits. Atherosclerotic calcification of the aortic arch. No acute osseous pathology. IMPRESSION: No acute cardiopulmonary process. Electronically Signed   By: Elgie Collard M.D.   On: 09/21/2019 18:50    ____________________________________________  PROCEDURES   Procedure(s) performed (including Critical Care):  .Critical Care Performed by: Shaune Pollack, MD Authorized by: Shaune Pollack, MD   Critical care provider statement:    Critical care time (minutes):  35   Critical care time was exclusive of:  Separately billable procedures and treating other patients and teaching time   Critical care was necessary to treat or prevent imminent or life-threatening deterioration of the following conditions:  Circulatory failure, cardiac failure and respiratory failure   Critical care was time spent personally by me on the following activities:  Development of treatment plan with patient or surrogate, discussions with consultants, evaluation of patient's response to treatment, examination of patient, obtaining history from patient or surrogate, ordering and performing treatments and interventions, ordering and review of laboratory studies, ordering and review of radiographic studies, pulse oximetry, re-evaluation of patient's condition and review of  old charts   I assumed direction of critical care for this patient from another provider in my specialty: no      ____________________________________________  INITIAL IMPRESSION / MDM / ASSESSMENT AND PLAN / ED COURSE  As part of my medical decision making, I reviewed the following data within the electronic MEDICAL RECORD NUMBER Nursing notes reviewed and incorporated, Old chart reviewed, Notes from prior ED visits, and [place] Controlled Substance Database       *Bethany Villa was evaluated in Emergency Department on 09/22/2019 for the symptoms described in the history of present illness. She was evaluated in the context of the global COVID-19 pandemic, which necessitated consideration that the patient might be at risk for infection with the SARS-CoV-2 virus that causes COVID-19. Institutional protocols and algorithms that pertain to the evaluation of patients at risk for COVID-19 are in a state of rapid change based on information released by regulatory bodies including the CDC and federal and state organizations. These policies and algorithms were followed during the patient's care in the ED.  Some ED evaluations and interventions may be delayed as a result of limited staffing during the pandemic.*     Medical Decision Making:  64 yo F here with acute on chronic resp distress. Diffuse tachypnea and wheezing on exam, c/w COPD. CXR without PNA. COVID negative. Interestingly, she dropped sats with her breathing tx likely reflecting the degree of her bronchospasm. Improving after CAT but with persistent tachypnea, increased WOB and wheezing. Admit for COPD exacerbation. Less likely COVID - swab is pending.  ____________________________________________  FINAL CLINICAL IMPRESSION(S) / ED DIAGNOSES  Final diagnoses:  COPD exacerbation (HCC)  Acute on chronic respiratory failure, unspecified whether with hypoxia or hypercapnia (HCC)     MEDICATIONS GIVEN DURING THIS VISIT:  Medications  magnesium  sulfate IVPB 2 g 50 mL (has no administration in time  range)  acetaminophen (TYLENOL) tablet 650 mg (has no administration in time range)  enoxaparin (LOVENOX) injection 40 mg (has no administration in time range)  0.45 % sodium chloride infusion (has no administration in time range)  azithromycin (ZITHROMAX) 500 mg in sodium chloride 0.9 % 250 mL IVPB (has no administration in time range)    Followed by  azithromycin (ZITHROMAX) tablet 500 mg (has no administration in time range)  methylPREDNISolone sodium succinate (SOLU-MEDROL) 125 mg/2 mL injection 60 mg (has no administration in time range)    Followed by  predniSONE (DELTASONE) tablet 40 mg (has no administration in time range)  ipratropium-albuterol (DUONEB) 0.5-2.5 (3) MG/3ML nebulizer solution 3 mL (has no administration in time range)  albuterol (PROVENTIL) (2.5 MG/3ML) 0.083% nebulizer solution 2.5 mg (has no administration in time range)  methylPREDNISolone sodium succinate (SOLU-MEDROL) 125 mg/2 mL injection 125 mg (125 mg Intravenous Given 09/21/19 2241)  ipratropium (ATROVENT) nebulizer solution 1 mg (1 mg Nebulization Given 09/21/19 2240)  ipratropium-albuterol (DUONEB) 0.5-2.5 (3) MG/3ML nebulizer solution 3 mL (3 mLs Nebulization Given 09/21/19 2344)  ipratropium-albuterol (DUONEB) 0.5-2.5 (3) MG/3ML nebulizer solution 3 mL (3 mLs Nebulization Given 09/21/19 2344)  ipratropium-albuterol (DUONEB) 0.5-2.5 (3) MG/3ML nebulizer solution 3 mL (3 mLs Nebulization Given 09/21/19 2348)     ED Discharge Orders    None       Note:  This document was prepared using Dragon voice recognition software and may include unintentional dictation errors.   Duffy Bruce, MD 09/22/19 [phone]

## 2019-09-21 NOTE — ED Triage Notes (Signed)
FIRST NURSE NOTE- pt on 2 L [place] chronic. Here for [place].  NAD at this time. sats 96 % RA

## 2019-09-21 NOTE — ED Triage Notes (Signed)
Pt to ED via POV c/o shortness of breath that has been worse over the past few weeks. Pt states that she is unable to walk from room to room without getting short of breath. Pt is on chronic O2 at 2 liters. Pt is in NAD.

## 2019-09-22 ENCOUNTER — Encounter: Payer: Self-pay | Admitting: Internal Medicine

## 2019-09-22 DIAGNOSIS — J441 Chronic obstructive pulmonary disease with (acute) exacerbation: Principal | ICD-10-CM

## 2019-09-22 DIAGNOSIS — J9622 Acute and chronic respiratory failure with hypercapnia: Secondary | ICD-10-CM

## 2019-09-22 DIAGNOSIS — I5022 Chronic systolic (congestive) heart failure: Secondary | ICD-10-CM

## 2019-09-22 DIAGNOSIS — G894 Chronic pain syndrome: Secondary | ICD-10-CM

## 2019-09-22 DIAGNOSIS — J9620 Acute and chronic respiratory failure, unspecified whether with hypoxia or hypercapnia: Secondary | ICD-10-CM

## 2019-09-22 HISTORY — DX: Chronic systolic (congestive) heart failure: I50.22

## 2019-09-22 LAB — CBC
HCT: 48.2 % — ABNORMAL HIGH (ref 36.0–46.0)
Hemoglobin: 14.7 g/dL (ref 12.0–15.0)
MCH: 29.1 pg (ref 26.0–34.0)
MCHC: 30.5 g/dL (ref 30.0–36.0)
MCV: 95.3 fL (ref 80.0–100.0)
Platelets: 112 10*3/uL — ABNORMAL LOW (ref 150–400)
RBC: 5.06 MIL/uL (ref 3.87–5.11)
RDW: 13.5 % (ref 11.5–15.5)
WBC: 4.2 10*3/uL (ref 4.0–10.5)
nRBC: 0 % (ref 0.0–0.2)

## 2019-09-22 LAB — BRAIN NATRIURETIC PEPTIDE: B Natriuretic Peptide: 47 pg/mL (ref 0.0–100.0)

## 2019-09-22 LAB — SARS CORONAVIRUS 2 BY RT PCR (HOSPITAL ORDER, PERFORMED IN ~~LOC~~ HOSPITAL LAB): SARS Coronavirus 2: NEGATIVE

## 2019-09-22 LAB — HIV ANTIBODY (ROUTINE TESTING W REFLEX): HIV Screen 4th Generation wRfx: NONREACTIVE

## 2019-09-22 MED ORDER — ACETAMINOPHEN 325 MG PO TABS
ORAL_TABLET | ORAL | Status: AC
  Filled 2019-09-22: qty 2

## 2019-09-22 MED ORDER — ACETAMINOPHEN 325 MG PO TABS
650.0000 mg | ORAL_TABLET | Freq: Four times a day (QID) | ORAL | Status: DC | PRN
  Administered 2019-09-22 – 2019-09-23 (×2): 650 mg via ORAL
  Filled 2019-09-22: qty 2

## 2019-09-22 MED ORDER — UMECLIDINIUM BROMIDE 62.5 MCG/INH IN AEPB
1.0000 | INHALATION_SPRAY | Freq: Every day | RESPIRATORY_TRACT | Status: DC
  Administered 2019-09-22 – 2019-09-25 (×4): 1 via RESPIRATORY_TRACT
  Filled 2019-09-22: qty 7

## 2019-09-22 MED ORDER — SERTRALINE HCL 50 MG PO TABS
50.0000 mg | ORAL_TABLET | Freq: Every day | ORAL | Status: DC
  Administered 2019-09-22 – 2019-09-25 (×4): 50 mg via ORAL
  Filled 2019-09-22 (×5): qty 1

## 2019-09-22 MED ORDER — OXYCODONE HCL 5 MG PO TABS
ORAL_TABLET | ORAL | Status: AC
  Filled 2019-09-22: qty 1

## 2019-09-22 MED ORDER — NICOTINE 21 MG/24HR TD PT24
21.0000 mg | MEDICATED_PATCH | Freq: Every day | TRANSDERMAL | Status: DC
  Administered 2019-09-22 – 2019-09-25 (×2): 21 mg via TRANSDERMAL
  Filled 2019-09-22 (×4): qty 1

## 2019-09-22 MED ORDER — MAGIC MOUTHWASH
5.0000 mL | Freq: Three times a day (TID) | ORAL | Status: DC | PRN
  Filled 2019-09-22: qty 10

## 2019-09-22 MED ORDER — FUROSEMIDE 40 MG PO TABS
40.0000 mg | ORAL_TABLET | Freq: Every day | ORAL | Status: DC
  Administered 2019-09-22 – 2019-09-25 (×4): 40 mg via ORAL
  Filled 2019-09-22 (×4): qty 1

## 2019-09-22 MED ORDER — FLUTICASONE-UMECLIDIN-VILANT 100-62.5-25 MCG/INH IN AEPB: 1.0000 | INHALATION_SPRAY | Freq: Every day | RESPIRATORY_TRACT | Status: DC

## 2019-09-22 MED ORDER — IPRATROPIUM-ALBUTEROL 0.5-2.5 (3) MG/3ML IN SOLN
RESPIRATORY_TRACT | Status: AC
  Filled 2019-09-22: qty 3

## 2019-09-22 MED ORDER — ENSURE ENLIVE PO LIQD
237.0000 mL | Freq: Two times a day (BID) | ORAL | Status: DC
  Administered 2019-09-22 – 2019-09-25 (×5): 237 mL via ORAL

## 2019-09-22 MED ORDER — ALPRAZOLAM 0.5 MG PO TABS
0.5000 mg | ORAL_TABLET | Freq: Two times a day (BID) | ORAL | Status: DC | PRN
  Administered 2019-09-22 – 2019-09-25 (×6): 0.5 mg via ORAL
  Filled 2019-09-22 (×5): qty 1

## 2019-09-22 MED ORDER — NICOTINE 21 MG/24HR TD PT24
21.0000 mg | MEDICATED_PATCH | Freq: Every day | TRANSDERMAL | Status: DC
  Administered 2019-09-23 – 2019-09-24 (×2): 21 mg via TRANSDERMAL
  Filled 2019-09-22 (×4): qty 1

## 2019-09-22 MED ORDER — FLUTICASONE FUROATE-VILANTEROL 100-25 MCG/INH IN AEPB
1.0000 | INHALATION_SPRAY | Freq: Every day | RESPIRATORY_TRACT | Status: DC
  Administered 2019-09-22 – 2019-09-25 (×4): 1 via RESPIRATORY_TRACT
  Filled 2019-09-22: qty 28

## 2019-09-22 MED ORDER — NON FORMULARY: 15.0000 ug | Status: DC

## 2019-09-22 MED ORDER — BUSPIRONE HCL 10 MG PO TABS
5.0000 mg | ORAL_TABLET | Freq: Two times a day (BID) | ORAL | Status: DC
  Administered 2019-09-22 – 2019-09-25 (×6): 5 mg via ORAL
  Filled 2019-09-22 (×7): qty 1

## 2019-09-22 MED ORDER — TIZANIDINE HCL 4 MG PO TABS
4.0000 mg | ORAL_TABLET | Freq: Two times a day (BID) | ORAL | Status: DC | PRN
  Filled 2019-09-22: qty 1

## 2019-09-22 MED ORDER — OXYCODONE HCL 5 MG PO TABS
ORAL_TABLET | ORAL | Status: AC
  Administered 2019-09-22: 19:00:00 5 mg via ORAL
  Filled 2019-09-22: qty 1

## 2019-09-22 MED ORDER — ALPRAZOLAM 0.5 MG PO TABS
ORAL_TABLET | ORAL | Status: AC
  Filled 2019-09-22: qty 1

## 2019-09-22 MED ORDER — BUPRENORPHINE 10 MCG/HR TD PTWK
1.0000 | MEDICATED_PATCH | TRANSDERMAL | Status: DC
  Administered 2019-09-22: 1 via TRANSDERMAL
  Filled 2019-09-22 (×2): qty 1

## 2019-09-22 MED ORDER — PHENOL 1.4 % MT LIQD
1.0000 | OROMUCOSAL | Status: DC | PRN
  Filled 2019-09-22: qty 177

## 2019-09-22 MED ORDER — OXYCODONE HCL 5 MG PO TABS
5.0000 mg | ORAL_TABLET | Freq: Four times a day (QID) | ORAL | Status: DC | PRN
  Administered 2019-09-22 – 2019-09-25 (×9): 5 mg via ORAL
  Filled 2019-09-22 (×7): qty 1

## 2019-09-22 MED ORDER — TOPIRAMATE 25 MG PO TABS
25.0000 mg | ORAL_TABLET | Freq: Two times a day (BID) | ORAL | Status: DC
  Administered 2019-09-22 – 2019-09-25 (×5): 25 mg via ORAL
  Filled 2019-09-22 (×8): qty 1

## 2019-09-22 MED ORDER — METHYLPREDNISOLONE SODIUM SUCC 125 MG IJ SOLR
INTRAMUSCULAR | Status: AC
  Filled 2019-09-22: qty 2

## 2019-09-22 MED ORDER — GUAIFENESIN-DM 100-10 MG/5ML PO SYRP
5.0000 mL | ORAL_SOLUTION | ORAL | Status: DC | PRN
  Filled 2019-09-22: qty 5

## 2019-09-22 MED ORDER — MUPIROCIN 2 % EX OINT
1.0000 "application " | TOPICAL_OINTMENT | Freq: Three times a day (TID) | CUTANEOUS | Status: DC
  Administered 2019-09-22 – 2019-09-24 (×6): 1 via TOPICAL
  Filled 2019-09-22 (×2): qty 22

## 2019-09-22 MED ORDER — ALPRAZOLAM 0.5 MG PO TABS
ORAL_TABLET | ORAL | Status: AC
  Administered 2019-09-22: 22:00:00 0.5 mg via ORAL
  Filled 2019-09-22: qty 1

## 2019-09-22 NOTE — H&P (Signed)
History and Physical    Bethany Villa:[id number] DOB: 1956/05/19 DOA: 09/21/2019  PCP: Marinda Elk, MD   Patient coming from: home  I have personally briefly reviewed patient's old medical records in [place]  Chief Complaint: Shortness of breath  HPI: Bethany Villa is a 64 y.o. female with medical history significant for COPD and chronic respiratory failure on home O2 at 2 L, chronic systolic heart failure last EF 40 to 45% in October 2020, fibromyalgia, last hospitalized in October 2020 for COPD exacerbation, who was brought into the emergency room with shortness of breath.  She denied chest pain, pedal edema orthopnea.  Denied nausea vomiting or diaphoresis and denied fever or chills  ED Course: Upon arrival in the emergency room she was afebrile with blood pressure 145/89 heart rate 79 oxygen saturations 95% on 2 L, but due to increased work of breathing she was placed on BiPAP .  Blood work was significant for a venous PCO2 of 102, whereas baseline arterial pCO2 is in the 50s.  White cell count was normal.  She had 2 - troponins.  EKG showed no acute ST-T wave changes.  Chest x-ray showed no acute disease.  Patient was treated with DuoNeb, IV Solu-Medrol as well as IV magnesium and hospitalization requested Review of Systems: As per HPI otherwise 10 point review of systems negative.    Past Medical History:  Diagnosis Date  . Anxiety   . Asthma   . Chronic pain    lower back  . Chronic systolic CHF (congestive heart failure) ([place]) 09/22/2019  . COPD (chronic obstructive pulmonary disease) ([place])   . Depression   . DJD (degenerative joint disease)   . Fibromyalgia   . GERD (gastroesophageal reflux disease)   . Headache   . Hyperlipidemia   . Lower extremity edema   . On home oxygen therapy    has not been on since 2016  . Panic attack   . Shortness of breath dyspnea   . Sleep disorder     Past Surgical History:  Procedure Laterality Date  . ABDOMINAL  SURGERY    . ANKLE SURGERY    . BACK SURGERY     lower  . CARPAL TUNNEL RELEASE    . CESAREAN SECTION    . KNEE SURGERY    . TONSILLECTOMY    . TOTAL KNEE ARTHROPLASTY Left 04/14/2016   Procedure: TOTAL KNEE ARTHROPLASTY;  Surgeon: Earnestine Leys, MD;  Location: [facility] ORS;  Service: Orthopedics;  Laterality: Left;  . TUBAL LIGATION       reports that she has been smoking cigarettes. She has a 45.00 pack-year smoking history. She has never used smokeless tobacco. She reports current alcohol use. She reports that she does not use drugs.  Allergies  Allergen Reactions  . Gabapentin Shortness Of Breath and Rash  . Mobic [Meloxicam] Anaphylaxis  . Penicillins Shortness Of Breath and Swelling    Has patient had a PCN reaction causing immediate rash, facial/tongue/throat swelling, SOB or lightheadedness with hypotension: unknown Has patient had a PCN reaction causing severe rash involving mucus membranes or skin necrosis: {unknown Has patient had a PCN reaction that required hospitalization {unknown Has patient had a PCN reaction occurring within the last 10 years: no If all of the above answers are "NO", then may proceed with Cephalosporin use.  . Sulfa Antibiotics Shortness Of Breath and Swelling    Family History  Problem Relation Age of Onset  . Cancer Mother  beast     Prior to Admission medications   Medication Sig Start Date End Date Taking? Authorizing Provider  albuterol (PROVENTIL) (2.5 MG/3ML) 0.083% nebulizer solution TAKE 3 ML BY NEBULIZATION Q 6 hrs prn FOR WHEEZING OR SOB Dx J44.9 10/06/17   Enedina Finner, MD  albuterol (VENTOLIN HFA) 108 (90 Base) MCG/ACT inhaler INHALE 2 PUFFS BY MOUTH EVERY 6 HOURS AS NEEDED FOR WHEEZE 10/20/18   [provider]  atorvastatin (LIPITOR) 20 MG tablet TAKE 1 TABLET (20 MG TOTAL) BY MOUTH DAILY. 04/29/16   Daphine Deutscher, Mary-Margaret, FNP  busPIRone (BUSPAR) 5 MG tablet Take 5 mg by mouth 2 (two) times daily.     [provider]  feeding supplement, ENSURE ENLIVE, (ENSURE ENLIVE) LIQD Take 237 mLs by mouth 2 (two) times daily between meals. 06/13/19   Delfino Lovett, MD  guaiFENesin-dextromethorphan (ROBITUSSIN DM) 100-10 MG/5ML syrup Take 5 mLs by mouth every 4 (four) hours as needed for cough. 06/13/19   Delfino Lovett, MD  magic mouthwash SOLN Take 5 mLs by mouth 3 (three) times daily as needed for mouth pain. 06/13/19   Delfino Lovett, MD  nicotine (NICODERM CQ - DOSED IN MG/24 HOURS) 21 mg/24hr patch Place 1 patch (21 mg total) onto the skin daily. 06/13/19   Delfino Lovett, MD  phenol (CHLORASEPTIC) 1.4 % LIQD Use as directed 1 spray in the mouth or throat as needed for throat irritation / pain. 06/13/19   Delfino Lovett, MD  predniSONE (STERAPRED UNI-PAK 21 TAB) 10 MG (21) TBPK tablet Start 60 mg po daily, taper 10 mg daily until done 06/13/19   Delfino Lovett, MD  sertraline (ZOLOFT) 50 MG tablet Take 1 tablet (50 mg total) by mouth daily. Patient taking differently: Take 100 mg by mouth daily.  10/08/17   Enedina Finner, MD  tiZANidine (ZANAFLEX) 4 MG tablet Take 1 tablet (4 mg total) by mouth 2 (two) times daily as needed for muscle spasms. 08/21/19 11/19/19  Edward Jolly, MD  traZODone (DESYREL) 100 MG tablet Take 1 tablet (100 mg total) by mouth at bedtime. 10/07/17   Enedina Finner, MD    Physical Exam: Vitals:   09/21/19 2130 09/21/19 2200 09/21/19 2230 09/21/19 2300  BP: (!) 152/97 (!) 148/97 (!) 154/103 (!) 158/88  Pulse: 84 86 81 70  Resp: 16 19 20  (!) 24  Temp:      TempSrc:      SpO2: 98% 99% 97% 93%  Weight:      Height:         Vitals:   09/21/19 2130 09/21/19 2200 09/21/19 2230 09/21/19 2300  BP: (!) 152/97 (!) 148/97 (!) 154/103 (!) 158/88  Pulse: 84 86 81 70  Resp: 16 19 20  (!) 24  Temp:      TempSrc:      SpO2: 98% 99% 97% 93%  Weight:      Height:        Constitutional: NAD, alert and oriented x 3. Bipap mask on Eyes: PERRL, lids and conjunctivae normal ENMT: Mucous membranes are moist.  Neck:  normal, supple, no masses, no thyromegaly Respiratory: Diminished bilaterally, few rhonchi, no crackles.  I will increase respiratory effort. No accessory muscle use.  Cardiovascular: Regular rate and rhythm, no murmurs / rubs / gallops. No extremity edema. 2+ pedal pulses. No carotid bruits.  Abdomen: no tenderness, no masses palpated. No hepatosplenomegaly. Bowel sounds positive.  Musculoskeletal: no clubbing / cyanosis. No joint deformity upper and lower extremities.  Skin: no rashes, lesions,  ulcers.  Neurologic: No gross focal neurologic deficit. Psychiatric: Normal mood and affect.   Labs on Admission: I have personally reviewed following labs and imaging studies  CBC: Recent Labs  Lab 09/21/19 1808  WBC 6.9  NEUTROABS 3.8  HGB 15.4*  HCT 49.7*  MCV 95.6  PLT 128*   Basic Metabolic Panel: Recent Labs  Lab 09/21/19 1808  NA 139  K 4.1  CL 95*  CO2 34*  GLUCOSE 89  BUN 8  CREATININE 0.59  CALCIUM 9.3   GFR: Estimated Creatinine Clearance: 71.7 mL/min (by C-G formula based on SCr of 0.59 mg/dL). Liver Function Tests: No results for input(s): AST, ALT, ALKPHOS, BILITOT, PROT, ALBUMIN in the last 168 hours. No results for input(s): LIPASE, AMYLASE in the last 168 hours. No results for input(s): AMMONIA in the last 168 hours. Coagulation Profile: No results for input(s): INR, PROTIME in the last 168 hours. Cardiac Enzymes: No results for input(s): CKTOTAL, CKMB, CKMBINDEX, TROPONINI in the last 168 hours. BNP (last 3 results) No results for input(s): PROBNP in the last 8760 hours. HbA1C: No results for input(s): HGBA1C in the last 72 hours. CBG: No results for input(s): GLUCAP in the last 168 hours. Lipid Profile: No results for input(s): CHOL, HDL, LDLCALC, TRIG, CHOLHDL, LDLDIRECT in the last 72 hours. Thyroid Function Tests: No results for input(s): TSH, T4TOTAL, FREET4, T3FREE, THYROIDAB in the last 72 hours. Anemia Panel: No results for input(s):  VITAMINB12, FOLATE, FERRITIN, TIBC, IRON, RETICCTPCT in the last 72 hours. Urine analysis:    Component Value Date/Time   COLORURINE AMBER (A) 09/01/2016 2230   APPEARANCEUR TURBID (A) 09/01/2016 2230   APPEARANCEUR Hazy 08/22/2013 1111   LABSPEC 1.026 09/01/2016 2230   LABSPEC 1.016 08/22/2013 1111   PHURINE 6.0 09/01/2016 2230   GLUCOSEU NEGATIVE 09/01/2016 2230   GLUCOSEU Negative 08/22/2013 1111   HGBUR NEGATIVE 09/01/2016 2230   BILIRUBINUR NEGATIVE 09/01/2016 2230   BILIRUBINUR Negative 08/22/2013 1111   KETONESUR NEGATIVE 09/01/2016 2230   PROTEINUR >=300 (A) 09/01/2016 2230   UROBILINOGEN 0.2 07/24/2014 0635   NITRITE NEGATIVE 09/01/2016 2230   LEUKOCYTESUR NEGATIVE 09/01/2016 2230   LEUKOCYTESUR 1+ 08/22/2013 1111    Radiological Exams on Admission: DG Chest 2 View  Result Date: 09/21/2019 CLINICAL DATA:  64 year old female with shortness of breath EXAM: CHEST - 2 VIEW COMPARISON:  Chest radiograph dated 06/10/2019 FINDINGS: There is background of emphysema and chronic interstitial coarsening. No focal consolidation, pleural effusion, pneumothorax. The cardiac silhouette is within normal limits. Atherosclerotic calcification of the aortic arch. No acute osseous pathology. IMPRESSION: No acute cardiopulmonary process. Electronically Signed   By: Elgie Collard M.D.   On: 09/21/2019 18:50    EKG: Independently reviewed.   Assessment/Plan Principal Problem:   COPD exacerbation (HCC):   Acute on chronic respiratory failure with hypercapnia (HCC) Patient presented with wheezing not responding to home bronchodilator therapy and increased work of breathing requiring BiPAP.  PCO2 on venous gas was 102 Continue BiPAP and wean as tolerated to O2 via nasal cannula Azithromycin IV then p.o. patient has severe penicillin allergy. IV steroids to taper to oral when appropriate Covid test was negative    Chronic systolic CHF (congestive heart failure) (HCC) Does not appear  decompensated Troponin negative x2 Chest x-ray showed no acute disease. Follow BNP EF October 2020 was 40 to 45%. Patient not currently on ACE, beta-blocker, for uncertain reasons but can be addressed prior to discharge Monitor for fluid overload as patient is being gently  hydrated    Fibromyalgia -Continue home meds of tizanidine      DVT prophylaxis: lovenox  Code Status: full code  Family Communication: none  Disposition Plan: Back to previous home environment Consults called: none     Andris Baumann MD [facility]     09/22/2019, 12:02 AM

## 2019-09-22 NOTE — OR Nursing (Signed)
Patient ate lunch and has been resting quietly, respiratory has completed duoneb, doctor just added home medications, oxygen saturations have maintained well above 90% around 95%.  Pain is better after medication.

## 2019-09-22 NOTE — Progress Notes (Signed)
[facility]- [place] [facility]   PATIENT NAME: Bethany Villa    MR#:  [id number]  DATE OF BIRTH:  19-Sep-1955  SUBJECTIVE:  CHIEF COMPLAINT:   Chief Complaint  Patient presents with  . Shortness of Breath  feels SOB, got off the BiPAP, now on nasal cannula oxygen.  Requesting nicotine patch REVIEW OF SYSTEMS:  Review of Systems  Constitutional: Positive for malaise/fatigue. Negative for diaphoresis, fever and weight loss.  HENT: Negative for ear discharge, ear pain, hearing loss, nosebleeds, sore throat and tinnitus.   Eyes: Negative for blurred vision and pain.  Respiratory: Positive for cough, shortness of breath and wheezing. Negative for hemoptysis.   Cardiovascular: Negative for chest pain, palpitations, orthopnea and leg swelling.  Gastrointestinal: Negative for abdominal pain, blood in stool, constipation, diarrhea, heartburn, nausea and vomiting.  Genitourinary: Negative for dysuria, frequency and urgency.  Musculoskeletal: Negative for back pain and myalgias.  Skin: Negative for itching and rash.  Neurological: Negative for dizziness, tingling, tremors, focal weakness, seizures, weakness and headaches.  Psychiatric/Behavioral: Negative for depression. The patient is not nervous/anxious.    DRUG ALLERGIES:   Allergies  Allergen Reactions  . Gabapentin Shortness Of Breath and Rash  . Mobic [Meloxicam] Anaphylaxis  . Penicillins Shortness Of Breath and Swelling    Has patient had a PCN reaction causing immediate rash, facial/tongue/throat swelling, SOB or lightheadedness with hypotension: unknown Has patient had a PCN reaction causing severe rash involving mucus membranes or skin necrosis: {unknown Has patient had a PCN reaction that required hospitalization {unknown Has patient had a PCN reaction occurring within the last 10 years: no If all of the above answers are "NO", then may proceed with Cephalosporin use.  . Sulfa Antibiotics Shortness Of  Breath and Swelling   VITALS:  Blood pressure (!) 148/73, pulse (!) 58, temperature 98.3 F (36.8 C), temperature source Oral, resp. rate 14, height 5\' 3"  (1.6 m), weight 77.1 kg, SpO2 95 %. PHYSICAL EXAMINATION:  Physical Exam HENT:     Head: Normocephalic and atraumatic.  Eyes:     Conjunctiva/sclera: Conjunctivae normal.     Pupils: Pupils are equal, round, and reactive to light.  Neck:     Thyroid: No thyromegaly.     Trachea: No tracheal deviation.  Cardiovascular:     Rate and Rhythm: Normal rate and regular rhythm.     Heart sounds: Normal heart sounds.  Pulmonary:     Effort: Pulmonary effort is normal. No respiratory distress.     Breath sounds: Normal breath sounds. No wheezing.  Chest:     Chest wall: No tenderness.  Abdominal:     General: Bowel sounds are normal. There is no distension.     Palpations: Abdomen is soft.     Tenderness: There is no abdominal tenderness.  Musculoskeletal:        General: Normal range of motion.     Cervical back: Normal range of motion and neck supple.  Skin:    General: Skin is warm and dry.     Findings: No rash.  Neurological:     Mental Status: She is alert and oriented to person, place, and time.     Cranial Nerves: No cranial nerve deficit.    LABORATORY PANEL:  Female CBC Recent Labs  Lab 09/22/19 0630  WBC 4.2  HGB 14.7  HCT 48.2*  PLT 112*   ------------------------------------------------------------------------------------------------------------------ Chemistries  Recent Labs  Lab 09/21/19 1808  NA 139  K 4.1  CL 95*  CO2 34*  GLUCOSE 89  BUN 8  CREATININE 0.59  CALCIUM 9.3   RADIOLOGY:  DG Chest 2 View  Result Date: 09/21/2019 CLINICAL DATA:  64 year old female with shortness of breath EXAM: CHEST - 2 VIEW COMPARISON:  Chest radiograph dated 06/10/2019 FINDINGS: There is background of emphysema and chronic interstitial coarsening. No focal consolidation, pleural effusion, pneumothorax. The cardiac  silhouette is within normal limits. Atherosclerotic calcification of the aortic arch. No acute osseous pathology. IMPRESSION: No acute cardiopulmonary process. Electronically Signed   By: Anner Crete M.D.   On: 09/21/2019 18:50   ASSESSMENT AND PLAN:  Bethany Villa is a 64 y.o. female with medical history significant for COPD and chronic respiratory failure on home O2 at 2 L, chronic systolic heart failure last EF 40 to 45% in October 2020, fibromyalgia, last hospitalized in October 2020 for COPD exacerbation, who was brought into the emergency room with shortness of breath.   Acute on chronic respiratory failure with hypercapnia ([name]) Patient presented with wheezing not responding to home bronchodilator therapy and increased work of breathing requiring BiPAP.  PCO2 on venous gas was 102 Off BiPAP and wean to O2 via nasal cannula Azithromycin IV then p.o. patient has severe penicillin allergy. IV steroids to taper to oral when appropriate Covid test was negative  Chronic systolic CHF (congestive heart failure) ([place]) Does not appear decompensated Troponin negative x2 Chest x-ray showed no acute disease. Follow BNP EF October 2020 was 40 to 45%. Patient not currently on ACE, beta-blocker, for uncertain reasons but can be addressed prior to discharge Monitor for fluid overload as patient is being gently hydrated   Ongoing tobacco abuse-counseled the patient for smoking cessation and the patient will receive further counseling here.NicoDerm patch ordered  Anxiety anddepression - continue home meds       All the records are reviewed and case discussed with Care Management/Social Worker. Management plans discussed with the patient, nursing and they are in agreement.  CODE STATUS: Full Code  TOTAL TIME TAKING CARE OF THIS PATIENT: 35 minutes.   More than 50% of the time was spent in counseling/coordination of care: YES  POSSIBLE D/C IN 1-2 DAYS, DEPENDING ON CLINICAL  CONDITION.   Max Sane M.D on 09/22/2019 at 11:57 AM  [facility]   CC: Primary care physician; Marinda Elk, MD  Note: This dictation was prepared with Dragon dictation along with smaller phrase technology. Any transcriptional errors that result from this process are unintentional.

## 2019-09-22 NOTE — ED Notes (Signed)
Pt st her breathing is better than it was before. Pt still intermittently dyspneic at time but is A&Ox4 and able to communicate in complete sentences without pauses

## 2019-09-23 ENCOUNTER — Encounter: Payer: Self-pay | Admitting: Internal Medicine

## 2019-09-23 DIAGNOSIS — I5022 Chronic systolic (congestive) heart failure: Secondary | ICD-10-CM

## 2019-09-23 LAB — BASIC METABOLIC PANEL
Anion gap: 5 (ref 5–15)
BUN: 16 mg/dL (ref 8–23)
CO2: 36 mmol/L — ABNORMAL HIGH (ref 22–32)
Calcium: 8.6 mg/dL — ABNORMAL LOW (ref 8.9–10.3)
Chloride: 93 mmol/L — ABNORMAL LOW (ref 98–111)
Creatinine, Ser: 0.67 mg/dL (ref 0.44–1.00)
GFR calc Af Amer: >60 mL/min (ref >60)
GFR calc non Af Amer: >60 mL/min (ref >60)
Glucose, Bld: 107 mg/dL — ABNORMAL HIGH (ref 70–99)
Potassium: 4.1 mmol/L (ref 3.5–5.1)
Sodium: 134 mmol/L — ABNORMAL LOW (ref 135–145)

## 2019-09-23 LAB — CBC
HCT: 42.6 % (ref 36.0–46.0)
Hemoglobin: 13.4 g/dL (ref 12.0–15.0)
MCH: 29.3 pg (ref 26.0–34.0)
MCHC: 31.5 g/dL (ref 30.0–36.0)
MCV: 93.2 fL (ref 80.0–100.0)
Platelets: 103 10*3/uL — ABNORMAL LOW (ref 150–400)
RBC: 4.57 MIL/uL (ref 3.87–5.11)
RDW: 13.4 % (ref 11.5–15.5)
WBC: 7.5 10*3/uL (ref 4.0–10.5)
nRBC: 0 % (ref 0.0–0.2)

## 2019-09-23 MED ORDER — ADULT MULTIVITAMIN W/MINERALS CH
1.0000 | ORAL_TABLET | Freq: Every day | ORAL | Status: DC
  Administered 2019-09-23 – 2019-09-25 (×3): 1 via ORAL
  Filled 2019-09-23 (×3): qty 1

## 2019-09-23 MED ORDER — IPRATROPIUM-ALBUTEROL 0.5-2.5 (3) MG/3ML IN SOLN
RESPIRATORY_TRACT | Status: AC
  Filled 2019-09-23: qty 3

## 2019-09-23 MED ORDER — IPRATROPIUM-ALBUTEROL 0.5-2.5 (3) MG/3ML IN SOLN
RESPIRATORY_TRACT | Status: AC
  Administered 2019-09-23: 02:00:00 3 mL via RESPIRATORY_TRACT
  Filled 2019-09-23: qty 3

## 2019-09-23 MED ORDER — ALPRAZOLAM 0.5 MG PO TABS
ORAL_TABLET | ORAL | Status: AC
  Administered 2019-09-23: 0.5 mg via ORAL
  Filled 2019-09-23: qty 1

## 2019-09-23 MED ORDER — OXYCODONE HCL 5 MG PO TABS
ORAL_TABLET | ORAL | Status: AC
  Administered 2019-09-23: 14:00:00 5 mg via ORAL
  Filled 2019-09-23: qty 1

## 2019-09-23 MED ORDER — OXYCODONE HCL 5 MG PO TABS
ORAL_TABLET | ORAL | Status: AC
  Filled 2019-09-23: qty 1

## 2019-09-23 MED ORDER — ENOXAPARIN SODIUM 40 MG/0.4ML ~~LOC~~ SOLN
SUBCUTANEOUS | Status: AC
  Administered 2019-09-23: 40 mg via SUBCUTANEOUS
  Filled 2019-09-23: qty 0.4

## 2019-09-23 MED ORDER — OXYCODONE HCL 5 MG PO TABS
ORAL_TABLET | ORAL | Status: AC
  Administered 2019-09-23: 5 mg via ORAL
  Filled 2019-09-23: qty 1

## 2019-09-23 NOTE — Plan of Care (Signed)
Patient resting quietly after transfer.  Dr. Myriam Forehand aware cord for continuous pulse ox is unavailable at this time and will continue to spot check o2 sats.  Currrently 92%.  Call bell in reach. Will continue to assess.

## 2019-09-23 NOTE — Progress Notes (Signed)
Have been working with patient during the course of shift with bipap. Started off with bipap auto. Patient could not adjust to deliverance of pressure. States she felt like there was not enough pressure at times at mask was causing pain + nasal dryness. Changed mode to bipap and set pressure to 12/5. Added humdifier and cushion to bridge of nose for comfort. o2 remained in place. Patient comfortable and stated bipap was more tolerable with said changes.

## 2019-09-23 NOTE — Progress Notes (Signed)
Initial Nutrition Assessment  RD working remotely.  DOCUMENTATION CODES:   Obesity unspecified  INTERVENTION:   -Continue Ensure Enlive po BID, each supplement provides 350 kcal and 20 grams of protein -MVI with minerals daily  NUTRITION DIAGNOSIS:   Increased nutrient needs related to chronic illness(COPD) as evidenced by estimated needs.  GOAL:   Patient will meet greater than or equal to 90% of their needs  MONITOR:   PO intake, Supplement acceptance, Labs, Weight trends, Skin, I & O's  REASON FOR ASSESSMENT:   Consult Assessment of nutrition requirement/status  ASSESSMENT:   Bethany Villa is a 64 y.o. female with medical history significant for COPD and chronic respiratory failure on home O2 at 2 L, chronic systolic heart failure last EF 40 to 45% in October 2020, fibromyalgia, last hospitalized in October 2020 for COPD exacerbation, who was brought into the emergency room with shortness of breath.  She denied chest pain, pedal edema orthopnea.  Denied nausea vomiting or diaphoresis and denied fever or chills  Pt admitted with acute respiratory failure secondary to COPD exacerbation.   Reviewed I/O's: +750 ml x 24 hours  UOP: 550 ml x 24 hours  Unable to reach pt via phone.  Per RN notes, pt has been requiring Bi-pap.   No meal completion data to assess at this time.   Reviewed wt hx; pt has experienced a 3.2% wt loss over the past 3 months, which is not significant for time frame.   Medications reviewed and include prednisone.   Labs reviewed.   Diet Order:   Diet Order            Diet Heart Room service appropriate? Yes; Fluid consistency: Thin  Diet effective now              EDUCATION NEEDS:   No education needs have been identified at this time  Skin:  Skin Assessment: Reviewed RN Assessment  Last BM:  09/23/19  Height:   Ht Readings from Last 1 Encounters:  09/21/19 5\' 3"  (1.6 m)    Weight:   Wt Readings from Last 1 Encounters:   09/21/19 77.1 kg    Ideal Body Weight:  52.3 kg  BMI:  Body mass index is 30.11 kg/m.  Estimated Nutritional Needs:   Kcal:  1900-2100  Protein:  90-105 grams  Fluid:  1.9-2.1 L    Jerrid Forgette A. 09/23/19, RD, LDN, CDCES Registered Dietitian II Certified Diabetes Care and Education Specialist Pager: [phone] After hours Pager: [phone]

## 2019-09-23 NOTE — Progress Notes (Addendum)
Progress Note    Bethany Villa  [id number] DOB: 09/10/1955  DOA: 09/21/2019 PCP: [name], MD      Brief Narrative:        Assessment/Plan:   Principal Problem:   COPD exacerbation (HCC) Active Problems:   Fibromyalgia   Acute on chronic respiratory failure with hypercapnia (HCC)   COPD with acute exacerbation (HCC)   Chronic systolic CHF (congestive heart failure) (HCC)   Body mass index is 30.11 kg/m.  (Obesity)  Acute COPD exacerbation: Continue azithromycin, steroids and bronchodilators.  Acute on chronic hypoxemic and hypercapnic respiratory failure: Continue 2.5 L/min oxygen via nasal cannula.  Chronic systolic CHF: EF in October 2020 was 40 to 45%.  Continue Lasix.  Depression/anxiety: Continue psychotropics  Migraine headache: Continue Topamax and analgesics as needed for pain   Family Communication/Anticipated D/C date and plan/Code Status   DVT prophylaxis: Lovenox Code Status: Full code Family Communication: Plan discussed with the patient Disposition Plan: Possible discharge to home in 1 to 2 days      Subjective:   She complains of migraine headache, cough, shortness of breath and wheezing  Objective:    Vitals:   09/23/19 0600 09/23/19 0730 09/23/19 1530 09/23/19 1620  BP:  (!) 168/95  125/73  Pulse:  70 88 78  Resp: 18 17    Temp:  97.6 F (36.4 C)  98.1 F (36.7 C)  TempSrc:      SpO2:  94% 94% 92%  Weight:      Height:        Intake/Output Summary (Last 24 hours) at 09/23/2019 1754 Last data filed at 09/23/2019 1530 Gross per 24 hour  Intake 1700 ml  Output 2650 ml  Net -950 ml   Filed Weights   09/21/19 1806  Weight: 77.1 kg    Exam:  GEN: NAD SKIN: No rash EYES: EOMI ENT: MMM CV: RRR PULM: Decreased air entry bilaterally, bilateral expiratory wheezing ABD: soft, obese, NT, +BS CNS: AAO x 3, non focal EXT: No edema or tenderness   Data Reviewed:   I have personally reviewed  following labs and imaging studies:  Labs: Labs show the following:   Basic Metabolic Panel: Recent Labs  Lab 09/21/19 1808 09/23/19 0606  NA 139 134*  K 4.1 4.1  CL 95* 93*  CO2 34* 36*  GLUCOSE 89 107*  BUN 8 16  CREATININE 0.59 0.67  CALCIUM 9.3 8.6*   GFR Estimated Creatinine Clearance: 71.7 mL/min (by C-G formula based on SCr of 0.67 mg/dL). Liver Function Tests: No results for input(s): AST, ALT, ALKPHOS, BILITOT, PROT, ALBUMIN in the last 168 hours. No results for input(s): LIPASE, AMYLASE in the last 168 hours. No results for input(s): AMMONIA in the last 168 hours. Coagulation profile No results for input(s): INR, PROTIME in the last 168 hours.  CBC: Recent Labs  Lab 09/21/19 1808 09/22/19 0630 09/23/19 0606  WBC 6.9 4.2 7.5  NEUTROABS 3.8  --   --   HGB 15.4* 14.7 13.4  HCT 49.7* 48.2* 42.6  MCV 95.6 95.3 93.2  PLT 128* 112* 103*   Cardiac Enzymes: No results for input(s): CKTOTAL, CKMB, CKMBINDEX, TROPONINI in the last 168 hours. BNP (last 3 results) No results for input(s): PROBNP in the last 8760 hours. CBG: No results for input(s): GLUCAP in the last 168 hours. D-Dimer: No results for input(s): DDIMER in the last 72 hours. Hgb A1c: No results for input(s): HGBA1C in the last 72  hours. Lipid Profile: No results for input(s): CHOL, HDL, LDLCALC, TRIG, CHOLHDL, LDLDIRECT in the last 72 hours. Thyroid function studies: No results for input(s): TSH, T4TOTAL, T3FREE, THYROIDAB in the last 72 hours.  Invalid input(s): FREET3 Anemia work up: No results for input(s): VITAMINB12, FOLATE, FERRITIN, TIBC, IRON, RETICCTPCT in the last 72 hours. Sepsis Labs: Recent Labs  Lab 09/21/19 1808 09/22/19 0630 09/23/19 0606  WBC 6.9 4.2 7.5    Microbiology Recent Results (from the past 240 hour(s))  SARS Coronavirus 2 by RT PCR (hospital order, performed in [place] hospital lab)     Status: None   Collection Time: 09/22/19  4:15 AM  Result Value Ref  Range Status   SARS Coronavirus 2 NEGATIVE NEGATIVE Final    Comment: (NOTE) SARS-CoV-2 target nucleic acids are NOT DETECTED. The SARS-CoV-2 RNA is generally detectable in upper and lower respiratory specimens during the acute phase of infection. The lowest concentration of SARS-CoV-2 viral copies this assay can detect is 250 copies / mL. A negative result does not preclude SARS-CoV-2 infection and should not be used as the sole basis for treatment or other patient management decisions.  A negative result may occur with improper specimen collection / handling, submission of specimen other than nasopharyngeal swab, presence of viral mutation(s) within the areas targeted by this assay, and inadequate number of viral copies (<250 copies / mL). A negative result must be combined with clinical observations, patient history, and epidemiological information. Fact Sheet for Patients:   StrictlyIdeas.no Fact Sheet for Healthcare Providers: BankingDealers.co.za This test is not yet approved or cleared  by the [place] FDA and has been authorized for detection and/or diagnosis of SARS-CoV-2 by FDA under an Emergency Use Authorization (EUA).  This EUA will remain in effect (meaning this test can be used) for the duration of the COVID-19 declaration under Section 564(b)(1) of the Act, 21 U.S.C. section 360bbb-3(b)(1), unless the authorization is terminated or revoked sooner. Performed at [facility], [address] [place], [place] [address]     Procedures and diagnostic studies:  DG Chest 2 View  Result Date: 09/21/2019 CLINICAL DATA:  64 year old female with shortness of breath EXAM: CHEST - 2 VIEW COMPARISON:  Chest radiograph dated 06/10/2019 FINDINGS: There is background of emphysema and chronic interstitial coarsening. No focal consolidation, pleural effusion, pneumothorax. The cardiac silhouette is within normal limits.  Atherosclerotic calcification of the aortic arch. No acute osseous pathology. IMPRESSION: No acute cardiopulmonary process. Electronically Signed   By: Anner Crete M.D.   On: 09/21/2019 18:50    Medications:   . acetaminophen  650 mg Oral Once  . azithromycin  500 mg Oral Daily  . buprenorphine  1 patch Transdermal Weekly  . busPIRone  5 mg Oral BID  . enoxaparin (LOVENOX) injection  40 mg Subcutaneous Q24H  . feeding supplement (ENSURE ENLIVE)  237 mL Oral BID BM  . fluticasone furoate-vilanterol  1 puff Inhalation Daily   And  . umeclidinium bromide  1 puff Inhalation Daily  . furosemide  40 mg Oral Daily  . ipratropium-albuterol  3 mL Nebulization Q6H  . ipratropium-albuterol      . ipratropium-albuterol      . multivitamin with minerals  1 tablet Oral Daily  . mupirocin ointment  1 application Topical TID  . nicotine  21 mg Transdermal Daily  . nicotine  21 mg Transdermal Daily  . oxyCODONE      . predniSONE  40 mg Oral Q breakfast  .  sertraline  50 mg Oral Daily  . topiramate  25 mg Oral BID   Continuous Infusions:   LOS: 2 days   Chanteria Haggard  [facility]   *Please refer to [email or web address], password [email or web address] to get updated schedule on who will round on this patient, as hospitalists switch teams weekly. If 7PM-7AM, please contact night-coverage at [email or web address], password [email or web address] for any overnight needs.  09/23/2019, 5:54 PM

## 2019-09-23 NOTE — Progress Notes (Signed)
Pt c/o generalized pain medicated as requested with oxycodone, with relief. Medicated with tylenol for headache with relief. Also,pain patch  applied as directed. Pt tolerated Bipap for 30 minutes, O2 saturation 92-93% with 3 liters n/c. Pt sleeping at the present time.

## 2019-09-24 NOTE — Plan of Care (Signed)

## 2019-09-24 NOTE — Care Management Important Message (Signed)
Important Message  Patient Details  Name: Bethany Villa MRN: [id number] Date of Birth: 1956/06/16   Medicare Important Message Given:  Yes     [name] 09/24/2019, 11:17 AM

## 2019-09-24 NOTE — Progress Notes (Signed)
Progress Note    Bethany Villa  [id number] DOB: [birth year]  DOA: 09/21/2019 PCP: Marinda Elk, MD      Brief Narrative:        Assessment/Plan:   Principal Problem:   COPD exacerbation ([place]) Active Problems:   Fibromyalgia   Acute on chronic respiratory failure with hypercapnia ([place])   COPD with acute exacerbation (HCC)   Chronic systolic CHF (congestive heart failure) ([place])   Body mass index is 30.11 kg/m.  (Obesity)  Acute COPD exacerbation: Continue azithromycin, steroids and bronchodilators.  Acute on chronic hypoxemic and hypercapnic respiratory failure: Continue 2.5 L/min oxygen via nasal cannula.  Patient has chronic hypercapnia with compensatory metabolic alkalosis and may qualify for BiPAP/trilogy machine at home.  This was discussed with her pulmonologist, Dr. Lanney Gins, who recommended spirometry.  He said he would try to arrange for her to have BiPAP for home use.  Chronic systolic CHF: EF in October 2020 was 40 to 45%.  Continue Lasix.  Depression/anxiety: Continue psychotropics  Migraine headache: Continue Topamax and analgesics as needed for pain   Family Communication/Anticipated D/C date and plan/Code Status   DVT prophylaxis: Lovenox Code Status: Full code Family Communication: Plan discussed with the patient Disposition Plan: Possible discharge to home tomorrow     Subjective:   Breathing is a little better but she is not back to baseline.  She still has a cough and wheezing.  She used BiPAP last night.   Objective:    Vitals:   09/23/19 2309 09/24/19 0010 09/24/19 0303 09/24/19 0745  BP: 118/76   111/77  Pulse: 82   80  Resp: 18   17  Temp: 97.8 F (36.6 C)   97.6 F (36.4 C)  TempSrc: Oral   Oral  SpO2: 94% 93% 92% 95%  Weight:      Height:        Intake/Output Summary (Last 24 hours) at 09/24/2019 1515 Last data filed at 09/24/2019 1437 Gross per 24 hour  Intake 600 ml  Output 2950 ml  Net -2350 ml    Filed Weights   09/21/19 1806  Weight: 77.1 kg    Exam:  GEN: NAD SKIN: No rash EYES: EOMI ENT: MMM CV: RRR PULM: Air entry improved on both sides.  Diffuse expiratory wheezing, no rales heard. ABD: soft, obese, NT, +BS CNS: AAO x 3, non focal EXT: No edema or tenderness     Data Reviewed:   I have personally reviewed following labs and imaging studies:  Labs: Labs show the following:   Basic Metabolic Panel: Recent Labs  Lab 09/21/19 1808 09/23/19 0606  NA 139 134*  K 4.1 4.1  CL 95* 93*  CO2 34* 36*  GLUCOSE 89 107*  BUN 8 16  CREATININE 0.59 0.67  CALCIUM 9.3 8.6*   GFR Estimated Creatinine Clearance: 71.7 mL/min (by C-G formula based on SCr of 0.67 mg/dL). Liver Function Tests: No results for input(s): AST, ALT, ALKPHOS, BILITOT, PROT, ALBUMIN in the last 168 hours. No results for input(s): LIPASE, AMYLASE in the last 168 hours. No results for input(s): AMMONIA in the last 168 hours. Coagulation profile No results for input(s): INR, PROTIME in the last 168 hours.  CBC: Recent Labs  Lab 09/21/19 1808 09/22/19 0630 09/23/19 0606  WBC 6.9 4.2 7.5  NEUTROABS 3.8  --   --   HGB 15.4* 14.7 13.4  HCT 49.7* 48.2* 42.6  MCV 95.6 95.3 93.2  PLT 128* 112* 103*  Cardiac Enzymes: No results for input(s): CKTOTAL, CKMB, CKMBINDEX, TROPONINI in the last 168 hours. BNP (last 3 results) No results for input(s): PROBNP in the last 8760 hours. CBG: No results for input(s): GLUCAP in the last 168 hours. D-Dimer: No results for input(s): DDIMER in the last 72 hours. Hgb A1c: No results for input(s): HGBA1C in the last 72 hours. Lipid Profile: No results for input(s): CHOL, HDL, LDLCALC, TRIG, CHOLHDL, LDLDIRECT in the last 72 hours. Thyroid function studies: No results for input(s): TSH, T4TOTAL, T3FREE, THYROIDAB in the last 72 hours.  Invalid input(s): FREET3 Anemia work up: No results for input(s): VITAMINB12, FOLATE, FERRITIN, TIBC, IRON,  RETICCTPCT in the last 72 hours. Sepsis Labs: Recent Labs  Lab 09/21/19 1808 09/22/19 0630 09/23/19 0606  WBC 6.9 4.2 7.5    Microbiology Recent Results (from the past 240 hour(s))  SARS Coronavirus 2 by RT PCR (hospital order, performed in [facility] hospital lab)     Status: None   Collection Time: 09/22/19  4:15 AM  Result Value Ref Range Status   SARS Coronavirus 2 NEGATIVE NEGATIVE Final    Comment: (NOTE) SARS-CoV-2 target nucleic acids are NOT DETECTED. The SARS-CoV-2 RNA is generally detectable in upper and lower respiratory specimens during the acute phase of infection. The lowest concentration of SARS-CoV-2 viral copies this assay can detect is 250 copies / mL. A negative result does not preclude SARS-CoV-2 infection and should not be used as the sole basis for treatment or other patient management decisions.  A negative result may occur with improper specimen collection / handling, submission of specimen other than nasopharyngeal swab, presence of viral mutation(s) within the areas targeted by this assay, and inadequate number of viral copies (<250 copies / mL). A negative result must be combined with clinical observations, patient history, and epidemiological information. Fact Sheet for Patients:   BoilerBrush.com.cy Fact Sheet for Healthcare Providers: https://pope.com/ This test is not yet approved or cleared  by the [place] FDA and has been authorized for detection and/or diagnosis of SARS-CoV-2 by FDA under an Emergency Use Authorization (EUA).  This EUA will remain in effect (meaning this test can be used) for the duration of the COVID-19 declaration under Section 564(b)(1) of the Act, 21 U.S.C. section 360bbb-3(b)(1), unless the authorization is terminated or revoked sooner. Performed at [facility], [address] [place], [place] [address]     Procedures and diagnostic studies:  No  results found.  Medications:   . acetaminophen  650 mg Oral Once  . azithromycin  500 mg Oral Daily  . buprenorphine  1 patch Transdermal Weekly  . busPIRone  5 mg Oral BID  . enoxaparin (LOVENOX) injection  40 mg Subcutaneous Q24H  . feeding supplement (ENSURE ENLIVE)  237 mL Oral BID BM  . fluticasone furoate-vilanterol  1 puff Inhalation Daily   And  . umeclidinium bromide  1 puff Inhalation Daily  . furosemide  40 mg Oral Daily  . ipratropium-albuterol  3 mL Nebulization Q6H  . multivitamin with minerals  1 tablet Oral Daily  . mupirocin ointment  1 application Topical TID  . nicotine  21 mg Transdermal Daily  . nicotine  21 mg Transdermal Daily  . predniSONE  40 mg Oral Q breakfast  . sertraline  50 mg Oral Daily  . topiramate  25 mg Oral BID   Continuous Infusions:   LOS: 3 days   Travaughn Vue  [facility]   *Please refer to [email or web address], password [facility]  to get updated schedule on who will round on this patient, as hospitalists switch teams weekly. If 7PM-7AM, please contact night-coverage at [email or web address], password [email or web address] for any overnight needs.  09/24/2019, 3:15 PM

## 2019-09-25 LAB — BLOOD GAS, ARTERIAL
Acid-Base Excess: 9 mmol/L — ABNORMAL HIGH (ref 0.0–2.0)
Bicarbonate: 35.9 mmol/L — ABNORMAL HIGH (ref 20.0–28.0)
FIO2: 0.32
O2 Saturation: 94.9 %
Patient temperature: 37
pCO2 arterial: 58 mmHg — ABNORMAL HIGH (ref 32.0–48.0)
pH, Arterial: 7.4 (ref 7.350–7.450)
pO2, Arterial: 75 mmHg — ABNORMAL LOW (ref 83.0–108.0)

## 2019-09-25 MED ORDER — ONDANSETRON HCL 4 MG/2ML IJ SOLN
4.0000 mg | Freq: Four times a day (QID) | INTRAMUSCULAR | Status: DC | PRN
  Administered 2019-09-25: 10:00:00 4 mg via INTRAVENOUS
  Filled 2019-09-25: qty 2

## 2019-09-25 MED ORDER — PREDNISONE 20 MG PO TABS: 40.0000 mg | ORAL_TABLET | Freq: Every day | ORAL | 0 refills | Status: AC

## 2019-09-25 NOTE — Plan of Care (Signed)
  Problem: Education: Goal: Knowledge of General Education information will improve Description: Including pain rating scale, medication(s)/side effects and non-pharmacologic comfort measures Outcome: Progressing   Problem: Clinical Measurements: Goal: Ability to maintain clinical measurements within normal limits will improve Outcome: Progressing Goal: Will remain free from infection Outcome: Progressing Goal: Diagnostic test results will improve Outcome: Progressing Goal: Respiratory complications will improve Outcome: Progressing Goal: Cardiovascular complication will be avoided Outcome: Progressing   Problem: Nutrition: Goal: Adequate nutrition will be maintained Outcome: Progressing   Problem: Coping: Goal: Level of anxiety will decrease Outcome: Progressing   

## 2019-09-25 NOTE — Clinical Social Work Note (Signed)
[facility] is ordering a trilogy for patient. Order form has been signed and emailed to [employer]. MD will include documentation the insurance company needs in his discharge summary. It typically takes 7-14 days for insurance authorization but they are hoping to expedite it for her to get approval in 2-4 days. Patient is aware and agreeable. Patient has orders to discharge home today. Her friend will pick her up. No further concerns. CSW signing off.  Charlynn Court, CSW [phone]

## 2019-09-25 NOTE — Discharge Summary (Signed)
Physician Discharge Summary  Bethany Villa ASN:[id number] DOB: 1956-03-25 DOA: 09/21/2019  PCP: Marinda Elk, MD  Admit date: 09/21/2019 Discharge date: 09/25/2019  Discharge disposition: Home   Recommendations for Outpatient Follow-Up:   Follow-up with PCP and pulmonologist   Discharge Diagnosis:   Principal Problem:   COPD exacerbation ([place]) Active Problems:   Fibromyalgia   Acute on chronic respiratory failure with hypercapnia ([place])   COPD with acute exacerbation ([place])   Chronic systolic CHF (congestive heart failure) ([place])    Discharge Condition: Stable.  Diet recommendation: Low-salt diet  Code status: Full code.    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  She presented to the hospital because of cough, increasing shortness of breath and wheezing.  She was admitted to the hospital for COPD exacerbation.  She was placed on BiPAP for increased work of breathing.  She was treated with steroids and bronchodilators.  Her condition has improved.  .  Patient was deemed to be a candidate for trilogy machine for home use because of chronic hypoxia and hypercapnia.  Inpatient spirometry showed FEV1 of 20.2% predicted, FEV1/FVC of 44.1% predicted.  [facility] is ordering trilogy machine for the patient and hopefully this will be delivered to her house soon.    Patient continues to suffer from Acute on Chronic Hypercapnic Respiratory Failure due to Advanced COPD. Patient requires the use of NIV both QHS and daytime to help with exacerbation periods. The use of the NIV will treat patient's High PCO2 Levels and can reduce risk of exacerbations and future hospitalizations  when used at night and during the day. Pt will need these advanced settings in conjunction with her current medication regimen; BIPAP is not an option due to its functional limitations and the severity of the patient's condition. Failure to have NIV available for use over a 24- hour period could lead to death. Patient is able to clear and maintain their own airway. Have been working with patient during the course of shift with bipap. Started off with bipap auto. Patient could not adjust to deliverance of pressure. States she felt like there was not enough pressure at times at mask was causing pain + nasal dryness. Changed mode to bipap and set pressure to 12/5. Added humidifier and cushion to bridge of nose for comfort. o2 remained in place. Patient comfortable and stated bipap was more tolerable with said changes. After repeated BiPap tries patient remains Hypercapnic with a PCO2 of 12mmHg so the use of Non-Invasive Ventilation is recommended to prevent future hospitalizations that could lead to death.      Discharge Exam:   Vitals:   09/25/19 0748 09/25/19 1309  BP:    Pulse:    Resp:    Temp:    SpO2: 96% 98%   Vitals:   09/24/19 2319 09/25/19 0740 09/25/19 0748 09/25/19 1309  BP: 115/70 124/84    Pulse: 88 83    Resp: 17 17    Temp: 97.7 F (36.5 C) 97.8 F (36.6 C)    TempSrc: Oral Oral    SpO2: 99% 97% 96% 98%  Weight:      Height:         GEN: NAD SKIN: No rash  EYES: EOMI ENT: MMM CV: RRR PULM: Mild bilateral expiratory wheezing, no rales heard ABD: soft, obese, NT, +BS CNS: AAO x 3, non focal EXT: No edema or tenderness   The results of significant diagnostics from this hospitalization (including imaging, microbiology, ancillary and laboratory) are listed below for reference.     Procedures and Diagnostic Studies:   DG Chest 2 View  Result Date: 09/21/2019 CLINICAL DATA:  64 year old female with shortness of breath EXAM: CHEST - 2 VIEW COMPARISON:  Chest  radiograph dated 06/10/2019 FINDINGS: There is background of emphysema and chronic interstitial coarsening. No focal consolidation, pleural effusion, pneumothorax. The cardiac silhouette is within normal limits. Atherosclerotic calcification of the aortic arch. No acute osseous pathology. IMPRESSION: No acute cardiopulmonary process. Electronically Signed   By: Elgie Collard M.D.   On: 09/21/2019 18:50     Labs:   Basic Metabolic Panel: Recent Labs  Lab 09/21/19 1808 09/23/19 0606  NA 139 134*  K 4.1 4.1  CL 95* 93*  CO2 34* 36*  GLUCOSE 89 107*  BUN 8 16  CREATININE 0.59 0.67  CALCIUM 9.3 8.6*   GFR Estimated Creatinine Clearance: 71.7 mL/min (by C-G formula based on SCr of 0.67 mg/dL). Liver Function Tests: No results for input(s): AST, ALT, ALKPHOS, BILITOT, PROT, ALBUMIN in the last 168 hours. No results for input(s): LIPASE, AMYLASE in the last 168 hours. No results for input(s): AMMONIA in the last 168 hours. Coagulation profile No results for input(s): INR, PROTIME in the last 168 hours.  CBC: Recent Labs  Lab 09/21/19 1808 09/22/19 0630 09/23/19 0606  WBC 6.9 4.2 7.5  NEUTROABS 3.8  --   --   HGB 15.4* 14.7 13.4  HCT 49.7* 48.2* 42.6  MCV 95.6 95.3 93.2  PLT 128* 112* 103*   Cardiac Enzymes: No results for input(s): CKTOTAL, CKMB, CKMBINDEX, TROPONINI in the last 168 hours. BNP: Invalid input(s): POCBNP CBG: No results for input(s): GLUCAP in the last 168 hours. D-Dimer No results for input(s): DDIMER in the last 72 hours. Hgb A1c No results for input(s): HGBA1C in the last 72 hours. Lipid Profile No results for input(s): CHOL, HDL, LDLCALC, TRIG, CHOLHDL, LDLDIRECT in the last 72 hours. Thyroid function studies No results for input(s): TSH, T4TOTAL, T3FREE, THYROIDAB in the last 72 hours.  Invalid input(s): FREET3 Anemia work up No results for input(s): VITAMINB12, FOLATE, FERRITIN, TIBC, IRON, RETICCTPCT in the last 72  hours. Microbiology Recent Results (from the past 240 hour(s))  SARS Coronavirus 2 by RT PCR (hospital order, performed in [facility] hospital lab)     Status: None   Collection Time: 09/22/19  4:15 AM  Result Value Ref Range Status   SARS Coronavirus 2 NEGATIVE NEGATIVE Final    Comment: (NOTE) SARS-CoV-2 target nucleic acids are NOT DETECTED. The SARS-CoV-2 RNA is generally detectable in upper and lower respiratory specimens during the acute phase of infection. The lowest concentration of SARS-CoV-2 viral copies this assay can detect is 250 copies / mL. A negative result does not preclude SARS-CoV-2 infection and should not be used as the sole basis for treatment or other patient management decisions.  A negative result may occur with improper specimen collection / handling, submission of specimen other than nasopharyngeal swab, presence of viral mutation(s) within the areas targeted by this assay, and inadequate number of viral copies (<250 copies / mL). A negative result must be combined with clinical observations, patient history, and epidemiological information. Fact Sheet for Patients:   BoilerBrush.com.cy  Fact Sheet for Healthcare Providers: https://pope.com/ This test is not yet approved or cleared  by the [place] FDA and has been authorized for detection and/or diagnosis of SARS-CoV-2 by FDA under an Emergency Use Authorization (EUA).  This EUA will remain in effect (meaning this test can be used) for the duration of the COVID-19 declaration under Section 564(b)(1) of the Act, 21 U.S.C. section 360bbb-3(b)(1), unless the authorization is terminated or revoked sooner. Performed at [facility], [address] [place], [place] [address]      Discharge Instructions:   Discharge Instructions    Diet - low sodium heart healthy   Complete by: As directed    Increase activity slowly   Complete by: As  directed      Allergies as of 09/25/2019      Reactions   Gabapentin Shortness Of Breath, Rash   Mobic [meloxicam] Anaphylaxis   Penicillins Shortness Of Breath, Swelling   Has patient had a PCN reaction causing immediate rash, facial/tongue/throat swelling, SOB or lightheadedness with hypotension: unknown Has patient had a PCN reaction causing severe rash involving mucus membranes or skin necrosis: {unknown Has patient had a PCN reaction that required hospitalization {unknown Has patient had a PCN reaction occurring within the last 10 years: no If all of the above answers are "NO", then may proceed with Cephalosporin use.   Sulfa Antibiotics Shortness Of Breath, Swelling      Medication List    STOP taking these medications   magic mouthwash Soln   predniSONE 10 MG (21) Tbpk tablet Commonly known as: STERAPRED UNI-PAK 21 TAB Replaced by: predniSONE 20 MG tablet   traZODone 100 MG tablet Commonly known as: DESYREL     TAKE these medications   albuterol (2.5 MG/3ML) 0.083% nebulizer solution Commonly known as: PROVENTIL TAKE 3 ML BY NEBULIZATION Q 6 hrs prn FOR WHEEZING OR SOB Dx J44.9   Ventolin HFA 108 (90 Base) MCG/ACT inhaler Generic drug: albuterol INHALE 2 PUFFS BY MOUTH EVERY 6 HOURS AS NEEDED FOR WHEEZE   ALPRAZolam 0.5 MG tablet Commonly known as: XANAX Take 0.5 mg by mouth 2 (two) times daily as needed.   atorvastatin 20 MG tablet Commonly known as: LIPITOR TAKE 1 TABLET (20 MG TOTAL) BY MOUTH DAILY.   Buprenorphine 15 MCG/HR Ptwk Place 15 mcg onto the skin See admin instructions. Place 15 mcg/hour continuous onto the skin once a week.   busPIRone 5 MG tablet Commonly known as: BUSPAR Take 5 mg by mouth 2 (two) times daily.   feeding supplement (ENSURE ENLIVE) Liqd Take 237 mLs by mouth 2 (two) times daily between meals.   furosemide 40 MG tablet Commonly known as: LASIX Take 40 mg by mouth daily.   guaiFENesin-dextromethorphan 100-10 MG/5ML  syrup Commonly known as: ROBITUSSIN DM Take 5 mLs by mouth every 4 (four) hours as needed for cough.   mupirocin ointment 2 % Commonly known as: BACTROBAN Apply 1 application topically 3 (three) times daily.   nicotine 21 mg/24hr patch Commonly known as: NICODERM CQ - dosed in mg/24 hours Place 1 patch (21 mg total) onto the skin daily.   ondansetron 4 MG tablet Commonly known as: ZOFRAN Take 4 mg by mouth as needed for nausea/vomiting.   phenol 1.4 % Liqd Commonly known as: CHLORASEPTIC Use as directed 1 spray in the mouth or throat as needed for throat irritation / pain.   predniSONE 20 MG tablet Commonly known as: DELTASONE Take 2 tablets (40 mg total) by mouth  daily with breakfast for 3 days. Start taking on: September 26, 2019 Replaces: predniSONE 10 MG (21) Tbpk tablet   sertraline 50 MG tablet Commonly known as: ZOLOFT Take 1 tablet (50 mg total) by mouth daily.   tiZANidine 4 MG tablet Commonly known as: Zanaflex Take 1 tablet (4 mg total) by mouth 2 (two) times daily as needed for muscle spasms.   topiramate 25 MG tablet Commonly known as: TOPAMAX Take 25 mg by mouth 2 (two) times daily.   Trelegy Ellipta 100-62.5-25 MCG/INH Aepb Generic drug: Fluticasone-Umeclidin-Vilant Inhale 1 puff into the lungs daily.         Time coordinating discharge: 32 minutes  Signed:  Charmelle Soh  [facility] 09/25/2019, 3:57 PM

## 2019-09-26 ENCOUNTER — Telehealth: Payer: Self-pay

## 2019-09-26 NOTE — Telephone Encounter (Signed)
Attempted to call for pre virtual appointment questions. LM to call us back

## 2019-09-26 NOTE — Telephone Encounter (Signed)
Pt returned the call. Please give her a call.                                      Thanks  

## 2019-09-28 ENCOUNTER — Telehealth: Payer: Self-pay

## 2019-09-28 ENCOUNTER — Telehealth: Payer: Self-pay | Admitting: *Deleted

## 2019-09-28 ENCOUNTER — Encounter: Payer: Self-pay | Admitting: Student in an Organized Health Care Education/Training Program

## 2019-09-28 NOTE — Telephone Encounter (Signed)
Patient has a VV appt Monday and MD will prescribe then.

## 2019-09-28 NOTE — Progress Notes (Signed)
Patient was recently in hospital for 5 days with breathing issue. They started her back on continus O2 2L. She states she will decrease this. She is a current smoke but is decreasing and wanting to quit. Patient was also started on a Steroid taper 09/29/19 will be her last dose.

## 2019-09-28 NOTE — Telephone Encounter (Signed)
Attempted to call patient for Mondays visit. No answer. Left message on AM to call us so that we can get some information.

## 2019-09-28 NOTE — Telephone Encounter (Signed)
Called pat and got information needed.

## 2019-10-01 ENCOUNTER — Ambulatory Visit
Payer: Medicare Other | Attending: Student in an Organized Health Care Education/Training Program | Admitting: Student in an Organized Health Care Education/Training Program

## 2019-10-01 ENCOUNTER — Encounter: Payer: Self-pay | Admitting: Student in an Organized Health Care Education/Training Program

## 2019-10-01 ENCOUNTER — Other Ambulatory Visit: Payer: Self-pay

## 2019-10-01 ENCOUNTER — Other Ambulatory Visit: Payer: Self-pay | Admitting: Student in an Organized Health Care Education/Training Program

## 2019-10-01 DIAGNOSIS — M542 Cervicalgia: Secondary | ICD-10-CM

## 2019-10-01 DIAGNOSIS — M797 Fibromyalgia: Secondary | ICD-10-CM

## 2019-10-01 DIAGNOSIS — M546 Pain in thoracic spine: Secondary | ICD-10-CM | POA: Diagnosis not present

## 2019-10-01 DIAGNOSIS — M5030 Other cervical disc degeneration, unspecified cervical region: Secondary | ICD-10-CM | POA: Diagnosis not present

## 2019-10-01 DIAGNOSIS — G8929 Other chronic pain: Secondary | ICD-10-CM

## 2019-10-01 DIAGNOSIS — G894 Chronic pain syndrome: Secondary | ICD-10-CM

## 2019-10-01 DIAGNOSIS — F411 Generalized anxiety disorder: Secondary | ICD-10-CM

## 2019-10-01 DIAGNOSIS — Z96652 Presence of left artificial knee joint: Secondary | ICD-10-CM

## 2019-10-01 DIAGNOSIS — M170 Bilateral primary osteoarthritis of knee: Secondary | ICD-10-CM

## 2019-10-01 MED ORDER — BUPRENORPHINE 15 MCG/HR TD PTWK: 15.0000 ug | MEDICATED_PATCH | TRANSDERMAL | 2 refills | Status: DC

## 2019-10-01 MED ORDER — ACETAMINOPHEN 500 MG PO TABS: 1000.0000 mg | ORAL_TABLET | Freq: Two times a day (BID) | ORAL | 2 refills | Status: AC | PRN

## 2019-10-01 NOTE — Telephone Encounter (Signed)
Patient states she also needs refills on her Tizanidine and would like to up the dosage mg on it

## 2019-10-01 NOTE — Telephone Encounter (Signed)
Patient called stating she is out of meds and has no transportation to go get them. Wanted to know why they havent been sent to pharmacy yet? The pharmacy usually delivers them but has not received refills yet? Was extremely stressed

## 2019-10-01 NOTE — Progress Notes (Signed)
Patient: Bethany Villa  Service Category: E/M  Provider: Gillis Santa, MD  DOB: Dec 16, 1955  DOS: 10/01/2019  Location: Office  MRN: [id number]  Setting: Ambulatory outpatient  Referring Provider: Marinda Elk, MD  Type: Established Patient  Specialty: Interventional Pain Management  PCP: Marinda Elk, MD  Location: Home  Delivery: TeleHealth     Virtual Encounter - Pain Management PROVIDER NOTE: Information contained herein reflects review and annotations entered in association with encounter. Interpretation of such information and data should be left to medically-trained personnel. Information provided to patient can be located elsewhere in the medical record under "Patient Instructions". Document created using STT-dictation technology, any transcriptional errors that may result from process are unintentional.    Contact & Pharmacy Preferred: [phone] Home: [phone] (home) Mobile: [phone] (mobile) E-mail: [phone] [email or web address]  [id number] - [place], [place] - [address] [place] [address] Phone: [phone] Fax: [place], [address] [place] [address] Phone: [phone] Fax: [phone]   Pre-screening  Bethany Villa offered "in-person" vs "virtual" encounter. She indicated preferring virtual for this encounter.   Reason COVID-19*  Social distancing based on CDC and AMA recommendations.   I contacted Bethany Villa on 10/01/2019 via telephone.      I clearly identified myself as Gillis Santa, MD. I verified that I was speaking with the correct person using two identifiers (Name: Bethany Villa, and date of birth: 1955-12-19).  Consent I sought verbal advanced consent from Bethany Villa for virtual visit interactions. I informed Bethany Villa of possible security and privacy concerns, risks, and limitations associated with providing "not-in-person" medical  evaluation and management services. I also informed Bethany Villa of the availability of "in-person" appointments. Finally, I informed her that there would be a charge for the virtual visit and that she could be  personally, fully or partially, financially responsible for it. Bethany Villa expressed understanding and agreed to proceed.   Historic Elements   Bethany Villa is a 64 y.o. year old, female patient evaluated today after her last encounter by our practice on 09/28/2019. Bethany Villa  has a past medical history of Anxiety, Asthma, Chronic pain, Chronic systolic CHF (congestive heart failure) ([place]) (09/22/2019), COPD (chronic obstructive pulmonary disease) ([place]), Depression, DJD (degenerative joint disease), Fibromyalgia, GERD (gastroesophageal reflux disease), Headache, Hyperlipidemia, Lower extremity edema, On home oxygen therapy, Panic attack, Shortness of breath dyspnea, and Sleep disorder. 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 Bethany Villa has a current medication list which includes the following prescription(s): albuterol, albuterol, alprazolam, atorvastatin, buprenorphine, buspirone, feeding supplement (ensure enlive), trelegy ellipta, furosemide, guaifenesin-dextromethorphan, mupirocin ointment, nicotine, ondansetron, phenol, sertraline, tizanidine, topiramate, and acetaminophen. She  reports that she has been smoking cigarettes. She has a 45.00 pack-year smoking history. She has never used smokeless tobacco. She reports current alcohol use. She reports that she does not use drugs. Bethany Villa is allergic to gabapentin; mobic [meloxicam]; penicillins; and sulfa antibiotics.   HPI  Today, she is being contacted for medication management.   Patient is requesting refill of buprenorphine.  She states that she  would like this prescription sent to [facility] note patient was admitted to the hospital for COPD exacerbation.  She is on oxygen and finishing a steroid taper.  At her last  visit, her buprenorphine was increased from 10 to 15 mcg an hour.  Patient is finding benefit with this dose escalation as she states that it helps her manage her low back pain.  It is not as intense or as frequent.  She discussed any additional adjunctive's that she could utilize.  She denies having any liver disease.  I instructed the patient that she can utilize acetaminophen at 1000 mg twice daily as needed.  Patient endorsed understanding.  Continue Butrans dose at 15 mcg an hour.  Pharmacotherapy Assessment  Analgesic: 08/21/2019  4   08/21/2019  Buprenorphine 15 Mcg/Hr Patch  4.00  28 Bi Lat   [id number]   Nor (4575)   0  0.36 mg  Medicare   [place]     Monitoring: Pharmacotherapy: No side-effects or adverse reactions reported. [place] PMP: PDMP reviewed during this encounter.       Compliance: No problems identified. Effectiveness: Clinically acceptable. Plan: Refer to "POC".  UDS:  Summary  Date Value Ref Range Status  05/16/2018 FINAL  Final    Comment:    ==================================================================== TOXASSURE COMP DRUG ANALYSIS,UR ==================================================================== Test                             Result       Flag       Units Drug Present and Declared for Prescription Verification   Sertraline                     PRESENT      EXPECTED   Desmethylsertraline            PRESENT      EXPECTED    Desmethylsertraline is an expected metabolite of sertraline.   Trazodone                      PRESENT      EXPECTED   1,3 chlorophenyl piperazine    PRESENT      EXPECTED    1,3-chlorophenyl piperazine is an expected metabolite of    trazodone. Drug Present not Declared for Prescription Verification   Buprenorphine                  11            UNEXPECTED ng/mg creat   Norbuprenorphine               45           UNEXPECTED ng/mg creat    Source of buprenorphine is a scheduled prescription medication.    Norbuprenorphine is an expected metabolite of buprenorphine.   Methocarbamol                  PRESENT      UNEXPECTED Drug Absent but Declared for Prescription Verification   Tizanidine                     Not Detected UNEXPECTED    Tizanidine, as indicated in the declared medication list, is not    always detected even when used as directed. ==================================================================== Test                      Result    Flag   Units      Ref Range   Creatinine              126  mg/dL      >=59 ==================================================================== Declared Medications:  The flagging and interpretation on this report are based on the  following declared medications.  Unexpected results may arise from  inaccuracies in the declared medications.  **Note: The testing scope of this panel includes these medications:  Sertraline (Zoloft)  Trazodone  **Note: The testing scope of this panel does not include small to  moderate amounts of these reported medications:  Tizanidine  **Note: The testing scope of this panel does not include following  reported medications:  Albuterol (Proventil)  Atorvastatin (Lipitor)  Buspirone (BuSpar) ==================================================================== For clinical consultation, please call [phone]. ====================================================================    Laboratory Chemistry Profile (12 mo)  Renal: 09/23/2019: BUN 16; Creatinine, Ser 0.67  Lab Results  Component Value Date   GFRAA >60 09/23/2019   GFRNONAA >60 09/23/2019   Hepatic: 06/11/2019: Albumin 3.5 Lab Results  Component Value Date   AST 22 06/11/2019   ALT 20 06/11/2019   Other: No results found for requested labs within last 8760  hours.  Note: Above Lab results reviewed.  Imaging  DG Chest 2 View CLINICAL DATA:  64 year old female with shortness of breath  EXAM: CHEST - 2 VIEW  COMPARISON:  Chest radiograph dated 06/10/2019  FINDINGS: There is background of emphysema and chronic interstitial coarsening. No focal consolidation, pleural effusion, pneumothorax. The cardiac silhouette is within normal limits. Atherosclerotic calcification of the aortic arch. No acute osseous pathology.  IMPRESSION: No acute cardiopulmonary process.  Electronically Signed   By: Elgie Collard M.D.   On: 09/21/2019 18:50   Assessment  The primary encounter diagnosis was Cervicalgia. Diagnoses of Degeneration of cervical intervertebral disc, Fibromyalgia, Bilateral primary osteoarthritis of knee, Chronic bilateral thoracic back pain, Chronic pain syndrome, Chronic pain disorder, Chronic knee pain after total replacement of left knee joint, and Generalized anxiety disorder were also pertinent to this visit.  Plan of Care  I have changed Bethany Villa's Buprenorphine. I am also having her start on acetaminophen. Additionally, I am having her maintain her atorvastatin, albuterol, sertraline, busPIRone, albuterol, nicotine, phenol, feeding supplement (ENSURE ENLIVE), guaiFENesin-dextromethorphan, tiZANidine, ALPRAZolam, Trelegy Ellipta, furosemide, mupirocin ointment, ondansetron, and topiramate.  Pharmacotherapy (Medications Ordered): Meds ordered this encounter  Medications  . Buprenorphine 15 MCG/HR PTWK    Sig: Place 15 mcg onto the skin every 7 (seven) days.    Dispense:  1 patch    Refill:  2  . acetaminophen (TYLENOL) 500 MG tablet    Sig: Take 2 tablets (1,000 mg total) by mouth every 12 (twelve) hours as needed for mild pain or moderate pain.    Dispense:  120 tablet    Refill:  2   Follow-up plan:   Return in about 3 months (around 12/29/2019) for Medication Management.    Recent Visits Date Type Provider  Dept  08/21/19 Office Visit Edward Jolly, MD [facility]  07/05/19 Office Visit Edward Jolly, MD [facility]  Showing recent visits within past 90 days and meeting all other requirements   Today's Visits Date Type Provider Dept  10/01/19 Office Visit Edward Jolly, MD [facility]  Showing today's visits and meeting all other requirements   Future Appointments Date Type Provider Dept  12/20/19 Appointment Edward Jolly, MD [facility]  Showing future appointments within next 90 days and meeting all other requirements   I discussed the assessment and treatment plan with the patient. The patient was provided an opportunity to ask questions and all were answered. The  patient agreed with the plan and demonstrated an understanding of the instructions.  Patient advised to call back or seek an in-person evaluation if the symptoms or condition worsens.  Duration of encounter: 30 minutes.  Note by: Edward Jolly, MD Date: 10/01/2019; Time: 1:54 PM

## 2019-10-02 ENCOUNTER — Telehealth: Payer: Self-pay | Admitting: Student in an Organized Health Care Education/Training Program

## 2019-10-02 NOTE — Telephone Encounter (Signed)
I called [facility], [facility] can call [facility] and have the current script transferred. Patient notified.

## 2019-10-02 NOTE — Telephone Encounter (Addendum)
Patient states pharmacy does not have all her medications called in and she only has 1 muscle relaxer left Tizanidine. Pharmacy will be delivering at 1:30. Please check her medication refills from 10-01-19 and see if Dr. Cherylann Ratel can send in before 1pm today. Please let patient know.

## 2019-10-07 ENCOUNTER — Inpatient Hospital Stay
Admission: EM | Admit: 2019-10-07 | Discharge: 2019-10-11 | DRG: 189 | Disposition: A | Discharge status: Home / Self Care | Payer: Medicare Other | Attending: Internal Medicine | Admitting: Internal Medicine

## 2019-10-07 ENCOUNTER — Emergency Department: Payer: Medicare Other

## 2019-10-07 ENCOUNTER — Other Ambulatory Visit: Payer: Self-pay

## 2019-10-07 ENCOUNTER — Encounter: Payer: Self-pay | Admitting: Emergency Medicine

## 2019-10-07 DIAGNOSIS — J9602 Acute respiratory failure with hypercapnia: Secondary | ICD-10-CM | POA: Diagnosis not present

## 2019-10-07 DIAGNOSIS — G8929 Other chronic pain: Secondary | ICD-10-CM | POA: Diagnosis present

## 2019-10-07 DIAGNOSIS — E875 Hyperkalemia: Secondary | ICD-10-CM | POA: Diagnosis present

## 2019-10-07 DIAGNOSIS — M545 Low back pain: Secondary | ICD-10-CM | POA: Diagnosis present

## 2019-10-07 DIAGNOSIS — Z9981 Dependence on supplemental oxygen: Secondary | ICD-10-CM

## 2019-10-07 DIAGNOSIS — R296 Repeated falls: Secondary | ICD-10-CM | POA: Diagnosis present

## 2019-10-07 DIAGNOSIS — M1990 Unspecified osteoarthritis, unspecified site: Secondary | ICD-10-CM | POA: Diagnosis present

## 2019-10-07 DIAGNOSIS — Z886 Allergy status to analgesic agent status: Secondary | ICD-10-CM

## 2019-10-07 DIAGNOSIS — F17210 Nicotine dependence, cigarettes, uncomplicated: Secondary | ICD-10-CM | POA: Diagnosis present

## 2019-10-07 DIAGNOSIS — Z888 Allergy status to other drugs, medicaments and biological substances status: Secondary | ICD-10-CM

## 2019-10-07 DIAGNOSIS — Z803 Family history of malignant neoplasm of breast: Secondary | ICD-10-CM

## 2019-10-07 DIAGNOSIS — Z880 Allergy status to penicillin: Secondary | ICD-10-CM

## 2019-10-07 DIAGNOSIS — Z96652 Presence of left artificial knee joint: Secondary | ICD-10-CM | POA: Diagnosis present

## 2019-10-07 DIAGNOSIS — K219 Gastro-esophageal reflux disease without esophagitis: Secondary | ICD-10-CM | POA: Diagnosis present

## 2019-10-07 DIAGNOSIS — J9601 Acute respiratory failure with hypoxia: Secondary | ICD-10-CM

## 2019-10-07 DIAGNOSIS — I5022 Chronic systolic (congestive) heart failure: Secondary | ICD-10-CM | POA: Diagnosis present

## 2019-10-07 DIAGNOSIS — J449 Chronic obstructive pulmonary disease, unspecified: Secondary | ICD-10-CM | POA: Diagnosis present

## 2019-10-07 DIAGNOSIS — E785 Hyperlipidemia, unspecified: Secondary | ICD-10-CM | POA: Diagnosis present

## 2019-10-07 DIAGNOSIS — M797 Fibromyalgia: Secondary | ICD-10-CM | POA: Diagnosis present

## 2019-10-07 DIAGNOSIS — R4182 Altered mental status, unspecified: Secondary | ICD-10-CM

## 2019-10-07 DIAGNOSIS — Z9119 Patient's noncompliance with other medical treatment and regimen: Secondary | ICD-10-CM

## 2019-10-07 DIAGNOSIS — M50322 Other cervical disc degeneration at C5-C6 level: Secondary | ICD-10-CM | POA: Diagnosis present

## 2019-10-07 DIAGNOSIS — G479 Sleep disorder, unspecified: Secondary | ICD-10-CM | POA: Diagnosis present

## 2019-10-07 DIAGNOSIS — T40601A Poisoning by unspecified narcotics, accidental (unintentional), initial encounter: Secondary | ICD-10-CM | POA: Diagnosis not present

## 2019-10-07 DIAGNOSIS — J9621 Acute and chronic respiratory failure with hypoxia: Secondary | ICD-10-CM | POA: Diagnosis present

## 2019-10-07 DIAGNOSIS — R4189 Other symptoms and signs involving cognitive functions and awareness: Secondary | ICD-10-CM | POA: Diagnosis not present

## 2019-10-07 DIAGNOSIS — Z882 Allergy status to sulfonamides status: Secondary | ICD-10-CM

## 2019-10-07 DIAGNOSIS — J9622 Acute and chronic respiratory failure with hypercapnia: Secondary | ICD-10-CM | POA: Diagnosis present

## 2019-10-07 DIAGNOSIS — F329 Major depressive disorder, single episode, unspecified: Secondary | ICD-10-CM | POA: Diagnosis present

## 2019-10-07 DIAGNOSIS — Z20822 Contact with and (suspected) exposure to covid-19: Secondary | ICD-10-CM | POA: Diagnosis present

## 2019-10-07 DIAGNOSIS — R0902 Hypoxemia: Secondary | ICD-10-CM

## 2019-10-07 LAB — URINALYSIS, ROUTINE W REFLEX MICROSCOPIC
Bacteria, UA: NONE SEEN
Bilirubin Urine: NEGATIVE
Glucose, UA: NEGATIVE mg/dL
Hgb urine dipstick: NEGATIVE
Ketones, ur: NEGATIVE mg/dL
Leukocytes,Ua: NEGATIVE
Nitrite: NEGATIVE
Protein, ur: 100 mg/dL — AB
Specific Gravity, Urine: 1.02 (ref 1.005–1.030)
Squamous Epithelial / HPF: NONE SEEN (ref 0–5)
WBC, UA: NONE SEEN WBC/hpf (ref 0–5)
pH: 5 (ref 5.0–8.0)

## 2019-10-07 LAB — CBC WITH DIFFERENTIAL/PLATELET
Abs Immature Granulocytes: 0.6 10*3/uL — ABNORMAL HIGH (ref 0.00–0.07)
Basophils Absolute: 0.1 10*3/uL (ref 0.0–0.1)
Basophils Relative: 1 %
Eosinophils Absolute: 0 10*3/uL (ref 0.0–0.5)
Eosinophils Relative: 0 %
HCT: 52.7 % — ABNORMAL HIGH (ref 36.0–46.0)
Hemoglobin: 14.9 g/dL (ref 12.0–15.0)
Immature Granulocytes: 4 %
Lymphocytes Relative: 9 %
Lymphs Abs: 1.2 10*3/uL (ref 0.7–4.0)
MCH: 29.3 pg (ref 26.0–34.0)
MCHC: 28.3 g/dL — ABNORMAL LOW (ref 30.0–36.0)
MCV: 103.5 fL — ABNORMAL HIGH (ref 80.0–100.0)
Monocytes Absolute: 0.9 10*3/uL (ref 0.1–1.0)
Monocytes Relative: 7 %
Neutro Abs: 10.7 10*3/uL — ABNORMAL HIGH (ref 1.7–7.7)
Neutrophils Relative %: 79 %
Platelets: 114 10*3/uL — ABNORMAL LOW (ref 150–400)
RBC: 5.09 MIL/uL (ref 3.87–5.11)
RDW: 13.4 % (ref 11.5–15.5)
WBC: 13.6 10*3/uL — ABNORMAL HIGH (ref 4.0–10.5)
nRBC: 0.1 % (ref 0.0–0.2)

## 2019-10-07 LAB — PROCALCITONIN: Procalcitonin: <0.1 ng/mL

## 2019-10-07 LAB — COMPREHENSIVE METABOLIC PANEL
ALT: 29 U/L (ref 0–44)
AST: 29 U/L (ref 15–41)
Albumin: 3.4 g/dL — ABNORMAL LOW (ref 3.5–5.0)
Alkaline Phosphatase: 91 U/L (ref 38–126)
Anion gap: 7 (ref 5–15)
BUN: 17 mg/dL (ref 8–23)
CO2: 40 mmol/L — ABNORMAL HIGH (ref 22–32)
Calcium: 8.9 mg/dL (ref 8.9–10.3)
Chloride: 98 mmol/L (ref 98–111)
Creatinine, Ser: 0.61 mg/dL (ref 0.44–1.00)
GFR calc Af Amer: >60 mL/min (ref >60)
GFR calc non Af Amer: >60 mL/min (ref >60)
Glucose, Bld: 130 mg/dL — ABNORMAL HIGH (ref 70–99)
Potassium: 5.3 mmol/L — ABNORMAL HIGH (ref 3.5–5.1)
Sodium: 145 mmol/L (ref 135–145)
Total Bilirubin: 0.9 mg/dL (ref 0.3–1.2)
Total Protein: 7.3 g/dL (ref 6.5–8.1)

## 2019-10-07 LAB — URINE DRUG SCREEN, QUALITATIVE (ARMC ONLY)
Amphetamines, Ur Screen: NOT DETECTED
Barbiturates, Ur Screen: NOT DETECTED
Benzodiazepine, Ur Scrn: POSITIVE — AB
Cannabinoid 50 Ng, Ur ~~LOC~~: NOT DETECTED
Cocaine Metabolite,Ur ~~LOC~~: NOT DETECTED
MDMA (Ecstasy)Ur Screen: NOT DETECTED
Methadone Scn, Ur: NOT DETECTED
Opiate, Ur Screen: NOT DETECTED
Phencyclidine (PCP) Ur S: NOT DETECTED
Tricyclic, Ur Screen: NOT DETECTED

## 2019-10-07 LAB — BRAIN NATRIURETIC PEPTIDE: B Natriuretic Peptide: 370 pg/mL — ABNORMAL HIGH (ref 0.0–100.0)

## 2019-10-07 LAB — APTT: aPTT: 25 seconds (ref 24–36)

## 2019-10-07 LAB — RESPIRATORY PANEL BY RT PCR (FLU A&B, COVID)
Influenza A by PCR: NEGATIVE
Influenza B by PCR: NEGATIVE
SARS Coronavirus 2 by RT PCR: NEGATIVE

## 2019-10-07 LAB — LACTIC ACID, PLASMA: Lactic Acid, Venous: 1.1 mmol/L (ref 0.5–1.9)

## 2019-10-07 LAB — T4, FREE: Free T4: 0.62 ng/dL (ref 0.61–1.12)

## 2019-10-07 LAB — ETHANOL: Alcohol, Ethyl (B): <10 mg/dL (ref <10)

## 2019-10-07 LAB — PROTIME-INR
INR: 0.9 (ref 0.8–1.2)
Prothrombin Time: 12.1 seconds (ref 11.4–15.2)

## 2019-10-07 LAB — TSH: TSH: 0.811 u[IU]/mL (ref 0.350–4.500)

## 2019-10-07 MED ORDER — SODIUM CHLORIDE 0.9% FLUSH
3.0000 mL | Freq: Two times a day (BID) | INTRAVENOUS | Status: DC
  Administered 2019-10-08 – 2019-10-10 (×7): 3 mL via INTRAVENOUS

## 2019-10-07 MED ORDER — ONDANSETRON HCL 4 MG/2ML IJ SOLN
4.0000 mg | Freq: Once | INTRAMUSCULAR | Status: AC
  Administered 2019-10-07: 4 mg via INTRAVENOUS
  Filled 2019-10-07: qty 2

## 2019-10-07 MED ORDER — NICOTINE 21 MG/24HR TD PT24
21.0000 mg | MEDICATED_PATCH | Freq: Every day | TRANSDERMAL | Status: DC
  Administered 2019-10-08 – 2019-10-11 (×4): 21 mg via TRANSDERMAL
  Filled 2019-10-07 (×4): qty 1

## 2019-10-07 MED ORDER — ONDANSETRON HCL 4 MG/2ML IJ SOLN: 4.0000 mg | Freq: Four times a day (QID) | INTRAMUSCULAR | Status: DC | PRN

## 2019-10-07 MED ORDER — LIDOCAINE 5 % EX PTCH
1.0000 | MEDICATED_PATCH | CUTANEOUS | Status: DC
  Administered 2019-10-07 – 2019-10-10 (×4): 1 via TRANSDERMAL
  Filled 2019-10-07 (×5): qty 1

## 2019-10-07 MED ORDER — ATORVASTATIN CALCIUM 20 MG PO TABS
20.0000 mg | ORAL_TABLET | Freq: Every day | ORAL | Status: DC
  Administered 2019-10-08 – 2019-10-10 (×3): 20 mg via ORAL
  Filled 2019-10-07 (×3): qty 1

## 2019-10-07 MED ORDER — LEVOFLOXACIN IN D5W 750 MG/150ML IV SOLN: 750.0000 mg | Freq: Once | INTRAVENOUS | Status: DC

## 2019-10-07 MED ORDER — TIZANIDINE HCL 4 MG PO TABS
4.0000 mg | ORAL_TABLET | Freq: Two times a day (BID) | ORAL | Status: DC | PRN
  Administered 2019-10-09 – 2019-10-11 (×4): 4 mg via ORAL
  Filled 2019-10-07 (×5): qty 1

## 2019-10-07 MED ORDER — IPRATROPIUM-ALBUTEROL 0.5-2.5 (3) MG/3ML IN SOLN
3.0000 mL | Freq: Once | RESPIRATORY_TRACT | Status: AC
  Administered 2019-10-07: 3 mL via RESPIRATORY_TRACT
  Filled 2019-10-07: qty 3

## 2019-10-07 MED ORDER — SERTRALINE HCL 50 MG PO TABS
50.0000 mg | ORAL_TABLET | Freq: Every day | ORAL | Status: DC
  Administered 2019-10-08 – 2019-10-11 (×4): 50 mg via ORAL
  Filled 2019-10-07 (×4): qty 1

## 2019-10-07 MED ORDER — FLUTICASONE-UMECLIDIN-VILANT 100-62.5-25 MCG/INH IN AEPB: 1.0000 | INHALATION_SPRAY | Freq: Every day | RESPIRATORY_TRACT | Status: DC

## 2019-10-07 MED ORDER — VANCOMYCIN HCL IN DEXTROSE 1-5 GM/200ML-% IV SOLN
1000.0000 mg | Freq: Once | INTRAVENOUS | Status: AC
  Administered 2019-10-07: 1000 mg via INTRAVENOUS
  Filled 2019-10-07: qty 200

## 2019-10-07 MED ORDER — ACETAMINOPHEN 325 MG PO TABS: 650.0000 mg | ORAL_TABLET | Freq: Four times a day (QID) | ORAL | Status: DC | PRN

## 2019-10-07 MED ORDER — SODIUM CHLORIDE 0.9 % IV SOLN
2.0000 g | Freq: Once | INTRAVENOUS | Status: AC
  Administered 2019-10-07: 17:00:00 2 g via INTRAVENOUS
  Filled 2019-10-07: qty 2

## 2019-10-07 MED ORDER — ONDANSETRON HCL 4 MG PO TABS: 4.0000 mg | ORAL_TABLET | Freq: Four times a day (QID) | ORAL | Status: DC | PRN

## 2019-10-07 MED ORDER — ENOXAPARIN SODIUM 40 MG/0.4ML ~~LOC~~ SOLN
40.0000 mg | SUBCUTANEOUS | Status: DC
  Administered 2019-10-08 – 2019-10-10 (×3): 40 mg via SUBCUTANEOUS
  Filled 2019-10-07 (×3): qty 0.4

## 2019-10-07 MED ORDER — ACETAMINOPHEN 650 MG RE SUPP: 650.0000 mg | Freq: Four times a day (QID) | RECTAL | Status: DC | PRN

## 2019-10-07 MED ORDER — TOPIRAMATE 25 MG PO TABS
25.0000 mg | ORAL_TABLET | Freq: Two times a day (BID) | ORAL | Status: DC
  Administered 2019-10-08 – 2019-10-11 (×8): 25 mg via ORAL
  Filled 2019-10-07 (×9): qty 1

## 2019-10-07 MED ORDER — SODIUM CHLORIDE 0.9 % IV SOLN: 2.0000 g | Freq: Once | INTRAVENOUS | Status: DC

## 2019-10-07 MED ORDER — ALBUTEROL SULFATE (2.5 MG/3ML) 0.083% IN NEBU
15.0000 mg | INHALATION_SOLUTION | Freq: Once | RESPIRATORY_TRACT | Status: AC
  Administered 2019-10-07: 15 mg via RESPIRATORY_TRACT
  Filled 2019-10-07: qty 18

## 2019-10-07 MED ORDER — POLYETHYLENE GLYCOL 3350 17 G PO PACK
17.0000 g | PACK | Freq: Every day | ORAL | Status: DC | PRN
  Administered 2019-10-10: 17 g via ORAL
  Filled 2019-10-07: qty 1

## 2019-10-07 MED ORDER — ACETAMINOPHEN 500 MG PO TABS
1000.0000 mg | ORAL_TABLET | Freq: Once | ORAL | Status: AC
  Administered 2019-10-07: 1000 mg via ORAL
  Filled 2019-10-07: qty 2

## 2019-10-07 MED ORDER — FUROSEMIDE 10 MG/ML IJ SOLN
40.0000 mg | Freq: Every day | INTRAMUSCULAR | Status: DC
  Administered 2019-10-07 – 2019-10-09 (×3): 40 mg via INTRAVENOUS
  Filled 2019-10-07 (×3): qty 4

## 2019-10-07 NOTE — ED Provider Notes (Addendum)
[facility] Emergency Department Provider Note  ____________________________________________   None    (approximate)  I have reviewed the triage vital signs and the nursing notes.   HISTORY  Chief Complaint Unresponsive    HPI Bethany Villa is a 64 y.o. female  with significant for COPD, chronic hypoxemic and hypercapnic respiratory failure on home oxygen, 2 L/min, chronic systolic CHF with EF of 40 to 02% in October 2020, chronic neck pain, chronic back pain, fibromyalgia, recently hospitalized jan 2021 for COPD exacerbation.  During this admission  Patient was deemed to be a candidate for trilogy machine for home use because of chronic hypoxia and hypercapnia.  [facility] is ordering trilogy machine for the patient and hopefully this will be delivered to her house soon.  Patient was called out for unresponsiveness.  Patient's last known normal was yesterday evening.  Patient was found to be 88% on room air.  Patient was given Narcan given that they removed a morphine patch without much improvement.  They then treated a COPD exacerbation with magnesium, Solu-Medrol, epi, albuterol and Atrovent.  Patient is responsive to painful stimuli and moaning.  Patient comes into the ER for altered mental status therefore unable to get full HPI          Past Medical History:  Diagnosis Date  . Anxiety   . Asthma   . Chronic pain    lower back  . Chronic systolic CHF (congestive heart failure) (HCC) 09/22/2019  . COPD (chronic obstructive pulmonary disease) (HCC)   . Depression   . DJD (degenerative joint disease)   . Fibromyalgia   . GERD (gastroesophageal reflux disease)   . Headache   . Hyperlipidemia   . Lower extremity edema   . On home oxygen therapy    has not been on since 2016  . Panic attack   . Shortness of breath dyspnea   . Sleep disorder     Patient Active Problem List   Diagnosis Date Noted  . Chronic systolic CHF (congestive heart  failure) (HCC) 09/22/2019  . Degeneration of cervical intervertebral disc 07/05/2019  . Bilateral primary osteoarthritis of knee 04/26/2019  . Chronic bilateral thoracic back pain 12/05/2018  . Cervicalgia 12/05/2018  . 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 Acute adjustment disorder with anxiety 10/06/2017  . Palliative care by specialist   . Respiratory failure (HCC) 10/01/2017  . COPD exacerbation (HCC) 09/01/2016  . Elevated brain natriuretic peptide (BNP) level 09/01/2016  . LFT elevation 09/01/2016  . 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 Chronic obstructive pulmonary disease with acute lower respiratory infection (HCC)   . Acute respiratory failure with hypoxia and hypercapnia (HCC)   . Sepsis (HCC) 09/03/2015  . CAP (community acquired pneumonia) 09/01/2015  . SIRS (systemic inflammatory response syndrome) (HCC) 07/24/2014  . COPD with acute exacerbation (HCC) 09/29/2013  . Acute on chronic respiratory failure with hypercapnia (HCC) 11/27/2012  . Insomnia 11/04/2012  . Generalized anxiety disorder 11/04/2012  . Depression 11/04/2012  . Fibromyalgia 11/04/2012  . Metabolic encephalopathy 10/03/2011  . COPD (chronic obstructive pulmonary disease) (HCC) 06/01/2011  . Smoking 05/19/2011  . Obesity 05/19/2011  . Hyperglycemia 05/19/2011    Past Surgical History:  Procedure Laterality Date  . ABDOMINAL SURGERY    . ANKLE SURGERY    . BACK SURGERY     lower  . CARPAL TUNNEL RELEASE    . CESAREAN SECTION    . KNEE  SURGERY    . TONSILLECTOMY    . TOTAL KNEE ARTHROPLASTY Left 04/14/2016   Procedure: TOTAL KNEE ARTHROPLASTY;  Surgeon: Deeann Saint, MD;  Location: [facility] ORS;  Service: Orthopedics;  Laterality: Left;  . TUBAL LIGATION      Prior to Admission medications   Medication Sig Start Date End Date Taking? Authorizing Provider  acetaminophen (TYLENOL) 500 MG tablet Take 2 tablets (1,000 mg total) by mouth every 12 (twelve)  hours as needed for mild pain or moderate pain. 10/01/19 09/30/20  Edward Jolly, MD  albuterol (PROVENTIL) (2.5 MG/3ML) 0.083% nebulizer solution TAKE 3 ML BY NEBULIZATION Q 6 hrs prn FOR WHEEZING OR SOB Dx J44.9 10/06/17   Enedina Finner, MD  albuterol (VENTOLIN HFA) 108 (90 Base) MCG/ACT inhaler INHALE 2 PUFFS BY MOUTH EVERY 6 HOURS AS NEEDED FOR WHEEZE 10/20/18   [provider]  ALPRAZolam Prudy Feeler) 0.5 MG tablet Take 0.5 mg by mouth 2 (two) times daily as needed. 07/25/19   [provider]  atorvastatin (LIPITOR) 20 MG tablet TAKE 1 TABLET (20 MG TOTAL) BY MOUTH DAILY. 04/29/16   Daphine Deutscher Elzie Sheets-Margaret, FNP  Buprenorphine 15 MCG/HR PTWK Place 15 mcg onto the skin every 7 (seven) days. 10/01/19   Edward Jolly, MD  busPIRone (BUSPAR) 5 MG tablet Take 5 mg by mouth 2 (two) times daily.     [provider]  feeding supplement, ENSURE ENLIVE, (ENSURE ENLIVE) LIQD Take 237 mLs by mouth 2 (two) times daily between meals. 06/13/19   Delfino Lovett, MD  Fluticasone-Umeclidin-Vilant (TRELEGY ELLIPTA) 100-62.5-25 MCG/INH AEPB Inhale 1 puff into the lungs daily. 07/11/19   [provider]  furosemide (LASIX) 40 MG tablet Take 40 mg by mouth daily. 07/11/19   [provider]  guaiFENesin-dextromethorphan (ROBITUSSIN DM) 100-10 MG/5ML syrup Take 5 mLs by mouth every 4 (four) hours as needed for cough. 06/13/19   Delfino Lovett, MD  mupirocin ointment (BACTROBAN) 2 % Apply 1 application topically 3 (three) times daily. 07/04/19   [provider]  nicotine (NICODERM CQ - DOSED IN MG/24 HOURS) 21 mg/24hr patch Place 1 patch (21 mg total) onto the skin daily. 06/13/19   Delfino Lovett, MD  ondansetron (ZOFRAN) 4 MG tablet Take 4 mg by mouth as needed for nausea/vomiting. 09/04/19   [provider]  phenol (CHLORASEPTIC) 1.4 % LIQD Use as directed 1 spray in the mouth or throat as needed for throat irritation / pain. 06/13/19   Delfino Lovett, MD  sertraline (ZOLOFT) 50 MG tablet  Take 1 tablet (50 mg total) by mouth daily. 10/08/17   Enedina Finner, MD  tiZANidine (ZANAFLEX) 4 MG tablet Take 1 tablet (4 mg total) by mouth 2 (two) times daily as needed for muscle spasms. 08/21/19 11/19/19  Edward Jolly, MD  topiramate (TOPAMAX) 25 MG tablet Take 25 mg by mouth 2 (two) times daily. 08/02/19   [provider]    Allergies Gabapentin, Mobic [meloxicam], Penicillins, and Sulfa antibiotics  Family History  Problem Relation Age of Onset  . Cancer Mother        beast    Social History Social History   Tobacco Use  . Smoking status: Current Every Day Smoker    Packs/day: 1.00    Years: 45.00    Pack years: 45.00    Types: Cigarettes  . Smokeless tobacco: Never Used  Substance Use Topics  . Alcohol use: Yes    Alcohol/week: 0.0 standard drinks    Comment: rare  . Drug use: No  Review of Systems Unable to get full review of systems due to altered mental status ____________________________________________   PHYSICAL EXAM:  VITAL SIGNS: Blood pressure (!) 158/89, pulse 88, resp. rate 19, SpO2 96 %.   Constitutional: Altered, eyes closed, moaning Eyes: Chemosis bilaterally.  Pupils equal and reactive Head: Atraumatic. Nose: No congestion/rhinnorhea. Mouth/Throat: Mucous membranes are moist.   Neck: No stridor. Trachea Midline. FROM Cardiovascular: Normal rate, regular rhythm. Grossly normal heart sounds.  Good peripheral circulation. Respiratory: Increased respiratory effort,, wheezing bilaterally Gastrointestinal: Soft and nontender. No distention. No abdominal bruits.  Musculoskeletal: No lower extremity tenderness nor edema.  No joint effusions. Neurologic: GCS 8, eyes are closed, localizes pain, moaning. Skin:  Skin is warm, dry and intact. No rash noted. Psychiatric: Unable to fully assess due to altered mental status GU: Deferred   ____________________________________________   LABS (all labs ordered are listed, but only abnormal  results are displayed)  Labs Reviewed  CBC WITH DIFFERENTIAL/PLATELET - Abnormal; Notable for the following components:      Result Value   WBC 13.6 (*)    HCT 52.7 (*)    MCV 103.5 (*)    MCHC 28.3 (*)    Platelets 114 (*)    Neutro Abs 10.7 (*)    Abs Immature Granulocytes 0.60 (*)    All other components within normal limits  COMPREHENSIVE METABOLIC PANEL  URINALYSIS, ROUTINE W REFLEX MICROSCOPIC  URINE DRUG SCREEN, QUALITATIVE ([facility] ONLY)  ETHANOL  PROCALCITONIN  BLOOD GAS, VENOUS  TSH  T4, FREE  PROTIME-INR  APTT  POC SARS CORONAVIRUS 2 AG -  ED   ____________________________________________   ED ECG REPORT I, Bethany Villa, the attending physician, personally viewed and interpreted this ECG.  EKG is normal sinus rate of 90, no ST elevation, T wave inversion in V2, slightly elevated QTC at 506, right bundle branch block  ____________________________________________  RADIOLOGY I, Bethany Villa, personally viewed and evaluated these images (plain radiographs) as part of my medical decision making, as well as reviewing the written report by the radiologist.  ED MD interpretation:    Official radiology report(s): No results found.  ____________________________________________   PROCEDURES  Procedure(s) performed (including Critical Care):  .Critical Care Performed by: Bethany [place], MD Authorized by: Bethany [place], MD   Critical care provider statement:    Critical care time (minutes):  31   Critical care was necessary to treat or prevent imminent or life-threatening deterioration of the following conditions:  Respiratory failure   Critical care was time spent personally by me on the following activities:  Discussions with consultants, evaluation of patient's response to treatment, examination of patient, ordering and performing treatments and interventions, ordering and review of laboratory studies, ordering and review of radiographic studies, pulse oximetry,  re-evaluation of patient's condition, obtaining history from patient or surrogate and review of old charts     ____________________________________________   INITIAL IMPRESSION / ASSESSMENT AND PLAN / ED COURSE  NUSAYBA CADENAS was evaluated in Emergency Department on 10/07/2019 for the symptoms described in the history of present illness. She was evaluated in the context of the global COVID-19 pandemic, which necessitated consideration that the patient might be at risk for infection with the SARS-CoV-2 virus that causes COVID-19. Institutional protocols and algorithms that pertain to the evaluation of patients at risk for COVID-19 are in a state of rapid change based on information released by regulatory bodies including the CDC and federal and state organizations. These policies and algorithms  were followed during the patient's care in the ED.    Patient is a 65 year old who presents with altered mental status.  Suspect is most likely from hypercapnia.  Will get VBG to further evaluate.  However given we do not have a great story of what happened will get a CT head to make sure is no evidence of intracranial hemorrhage.  Will get labs to evaluate for electrolyte abnormalities, AKI, alcohol, drug use.  Patient sent immediately to the CT scanner and BiPAP and duo nebs ordered for when she returns.  Discussed with the oncoming doctor that there is a high chance that patient may need intubated but they can be reasonable to trial her on BiPAP.  Patient handed off to Dr. Colon Branch for further workup.  I did reevaluate patient with Dr. Colon Branch at bedside and patient's mental status is already improved.  Patient awakes to voice at this time.    ____________________________________________   FINAL CLINICAL IMPRESSION(S) / ED DIAGNOSES   Final diagnoses:  Acute respiratory failure with hypoxia and hypercapnia (HCC)  Altered mental status, unspecified altered mental status type      MEDICATIONS GIVEN  DURING THIS VISIT:  Medications  ondansetron (ZOFRAN) injection 4 mg (has no administration in time range)  ipratropium-albuterol (DUONEB) 0.5-2.5 (3) MG/3ML nebulizer solution 3 mL (3 mLs Nebulization Given 10/07/19 1508)  ipratropium-albuterol (DUONEB) 0.5-2.5 (3) MG/3ML nebulizer solution 3 mL (3 mLs Nebulization Given 10/07/19 1508)  ipratropium-albuterol (DUONEB) 0.5-2.5 (3) MG/3ML nebulizer solution 3 mL (3 mLs Nebulization Given 10/07/19 1508)     ED Discharge Orders    None       Note:  This document was prepared using Dragon voice recognition software and may include unintentional dictation errors.   Concha Se, MD 10/07/19 1517    Concha Se, MD 10/07/19 [phone]

## 2019-10-07 NOTE — ED Notes (Signed)
Pt transported to CT with Darl Pikes, RN

## 2019-10-07 NOTE — ED Notes (Addendum)
Pt took herself off bipap and is yelling for help to go to the bathroom. Calmed pt and explained why she is here. Pt has external catheter so pt agreed not to try and get up. Pt is normally on 2liter oxygen, so placed on that and she is 96-97%. Note sent to admitting dr to inform of the change.  This note and v/s charted by Darl Pikes, RN while helping me, admitting team messaged att

## 2019-10-07 NOTE — ED Triage Notes (Signed)
Pt via [facility] from home. Pt was found initially unresponsive. LKW was yesterday evening. Pt was initially 88% on RA. Pt has a hx of COPD. Pt had on a morphine patch, EMS removed and gave a total of 3 of Narcan. EMS also gave 2g Mag, 125mg  of SoluMedrol, 0.3 of EPI, 7.5 of Albuterol, and 0.5 of Atrovent. On arrival, pt is responsive to painful stimuli and occasionally moans. Dr. , MD at bedside.

## 2019-10-07 NOTE — ED Provider Notes (Signed)
Patient's mental status significantly improved since being initiated on BiPAP.  Eyes open to voice, follows commands, able to state that she is not in any pain. Still with significant wheezing bilaterally. Initial VBG demonstrates significant respiratory acidosis, acute on chronic hypercarbia.  pH 7.06, pCO2 greater than 120.  She is receiving DuoNeb x3.  Has already received magnesium, steroids, epinephrine with EMS prior to arrival.  CXR concerning for multifocal pneumonia versus asymmetric edema.  Given her recent hospitalization and critical presentation, will plan to cover for healthcare associated pneumonia with vancomycin and cefepime.  Lactic acid, cultures ordered.  COVID swab ordered.  CT head and C-spine without acute abnormalities.  We will continue to monitor closely.  Patient remains on BiPAP, mental status continuing to improve well. Still with wheezing. Will start on CAT 15 mg over an hour. Will discuss w/ hospitalist for admission.   .Critical Care Performed by: Miguel Aschoff., MD Authorized by: Miguel Aschoff., MD   Critical care provider statement:    Critical care time (minutes):  45   Critical care was necessary to treat or prevent imminent or life-threatening deterioration of the following conditions:  Respiratory failure   Critical care was time spent personally by me on the following activities:  Discussions with consultants, evaluation of patient's response to treatment, examination of patient, ordering and performing treatments and interventions, ordering and review of laboratory studies, ordering and review of radiographic studies, pulse oximetry, re-evaluation of patient's condition, obtaining history from patient or surrogate and review of old charts   I assumed direction of critical care for this patient from another provider in my specialty: yes        Miguel Aschoff., MD 10/07/19 1740

## 2019-10-07 NOTE — H&P (Addendum)
History and Physical    Bethany Villa:[id number] DOB: April 21, 1956 DOA: 10/07/2019  PCP: [name], MD   Patient coming from: Home  I have personally briefly reviewed patient's old medical records in [facility]  Chief Complaint: Unresponsive  HPI: Bethany Villa is a 64 y.o. female with medical history significant of COPD, chronic hypoxemic and hypercapnic respiratory failure on home oxygen, 2 L/min, chronic systolic CHF with EF of 40 to 81% in October 2020, chronic neck pain, chronic back pain, fibromyalgia, recently hospitalized jan 2021 for COPD exacerbation. During this admission Patient was deemed to be a candidate for trilogy machine for home use because of chronic hypoxia and hypercapnia. [facility] and hopefully this will be delivered to her house soon.  Patient was called out for unresponsiveness.  Patient's last known normal was yesterday evening.  Patient was found to be 88% on room air.  Patient was given Narcan given that they removed a morphine patch without much improvement.  They then treated a COPD exacerbation with magnesium, Solu-Medrol, epi, albuterol and Atrovent.   When seen today she was much more responsive on BiPAP following commands and answering yes or no with BiPAP mask on.  She was not using her home oxygen regularly.  She did had some worsening of exertional dyspnea, lives alone, denies any recent upper respiratory symptoms, fever or chills, chest pain, nausea, vomiting or diarrhea.  No change in her appetite or bowel habits.  No urinary symptoms.  Denies any sick contacts.  ED Course: On arrival to ED she was found to be minimally responsive, protecting airway, labs positive for pH of 7.06 on venous blood gas with CO2 of more than 120.  Mild hyperkalemia with potassium of 5.3 but sample was hemolyzed.  She was placed on BiPAP with significant improvement.  Review of Systems: As per HPI otherwise 10  point review of systems negative.   Past Medical History:  Diagnosis Date  . Anxiety   . Asthma   . Chronic pain    lower back  . Chronic systolic CHF (congestive heart failure) (HCC) 09/22/2019  . COPD (chronic obstructive pulmonary disease) (HCC)   . Depression   . DJD (degenerative joint disease)   . Fibromyalgia   . GERD (gastroesophageal reflux disease)   . Headache   . Hyperlipidemia   . Lower extremity edema   . On home oxygen therapy    has not been on since 2016  . Panic attack   . Shortness of breath dyspnea   . Sleep disorder     Past Surgical History:  Procedure Laterality Date  . ABDOMINAL SURGERY    . ANKLE SURGERY    . BACK SURGERY     lower  . CARPAL TUNNEL RELEASE    . CESAREAN SECTION    . KNEE SURGERY    . TONSILLECTOMY    . TOTAL KNEE ARTHROPLASTY Left 04/14/2016   Procedure: TOTAL KNEE ARTHROPLASTY;  Surgeon: Deeann Saint, MD;  Location: [facility] ORS;  Service: Orthopedics;  Laterality: Left;  . TUBAL LIGATION       reports that she has been smoking cigarettes. She has a 45.00 pack-year smoking history. She has never used smokeless tobacco. She reports current alcohol use. She reports that she does not use drugs.  Allergies  Allergen Reactions  . Gabapentin Shortness Of Breath and Rash  . Mobic [Meloxicam] Anaphylaxis  . Penicillins Shortness Of Breath and Swelling    Has  patient had a PCN reaction causing immediate rash, facial/tongue/throat swelling, SOB or lightheadedness with hypotension: unknown Has patient had a PCN reaction causing severe rash involving mucus membranes or skin necrosis: {unknown Has patient had a PCN reaction that required hospitalization {unknown Has patient had a PCN reaction occurring within the last 10 years: no If all of the above answers are "NO", then may proceed with Cephalosporin use.  . Sulfa Antibiotics Shortness Of Breath and Swelling    Family History  Problem Relation Age of Onset  . Cancer Mother         beast    Prior to Admission medications   Medication Sig Start Date End Date Taking? Authorizing Provider  acetaminophen (TYLENOL) 500 MG tablet Take 2 tablets (1,000 mg total) by mouth every 12 (twelve) hours as needed for mild pain or moderate pain. 10/01/19 09/30/20  Edward Jolly, MD  albuterol (PROVENTIL) (2.5 MG/3ML) 0.083% nebulizer solution TAKE 3 ML BY NEBULIZATION Q 6 hrs prn FOR WHEEZING OR SOB Dx J44.9 10/06/17   Enedina Finner, MD  albuterol (VENTOLIN HFA) 108 (90 Base) MCG/ACT inhaler INHALE 2 PUFFS BY MOUTH EVERY 6 HOURS AS NEEDED FOR WHEEZE 10/20/18   [provider]  ALPRAZolam Prudy Feeler) 0.5 MG tablet Take 0.5 mg by mouth 2 (two) times daily as needed. 07/25/19   [provider]  atorvastatin (LIPITOR) 20 MG tablet TAKE 1 TABLET (20 MG TOTAL) BY MOUTH DAILY. 04/29/16   Daphine Deutscher Mary-Margaret, FNP  Buprenorphine 15 MCG/HR PTWK Place 15 mcg onto the skin every 7 (seven) days. 10/01/19   Edward Jolly, MD  busPIRone (BUSPAR) 5 MG tablet Take 5 mg by mouth 2 (two) times daily.     [provider]  feeding supplement, ENSURE ENLIVE, (ENSURE ENLIVE) LIQD Take 237 mLs by mouth 2 (two) times daily between meals. 06/13/19   Delfino Lovett, MD  Fluticasone-Umeclidin-Vilant (TRELEGY ELLIPTA) 100-62.5-25 MCG/INH AEPB Inhale 1 puff into the lungs daily. 07/11/19   [provider]  furosemide (LASIX) 40 MG tablet Take 40 mg by mouth daily. 07/11/19   [provider]  guaiFENesin-dextromethorphan (ROBITUSSIN DM) 100-10 MG/5ML syrup Take 5 mLs by mouth every 4 (four) hours as needed for cough. 06/13/19   Delfino Lovett, MD  mupirocin ointment (BACTROBAN) 2 % Apply 1 application topically 3 (three) times daily. 07/04/19   [provider]  nicotine (NICODERM CQ - DOSED IN MG/24 HOURS) 21 mg/24hr patch Place 1 patch (21 mg total) onto the skin daily. 06/13/19   Delfino Lovett, MD  ondansetron (ZOFRAN) 4 MG tablet Take 4 mg by mouth as needed for nausea/vomiting. 09/04/19    [provider]  phenol (CHLORASEPTIC) 1.4 % LIQD Use as directed 1 spray in the mouth or throat as needed for throat irritation / pain. 06/13/19   Delfino Lovett, MD  sertraline (ZOLOFT) 50 MG tablet Take 1 tablet (50 mg total) by mouth daily. 10/08/17   Enedina Finner, MD  tiZANidine (ZANAFLEX) 4 MG tablet Take 1 tablet (4 mg total) by mouth 2 (two) times daily as needed for muscle spasms. 08/21/19 11/19/19  Edward Jolly, MD  topiramate (TOPAMAX) 25 MG tablet Take 25 mg by mouth 2 (two) times daily. 08/02/19   [provider]    Physical Exam: Vitals:   10/07/19 1458 10/07/19 1508 10/07/19 1714  BP: (!) 158/89    Pulse: 85 88   Resp: (!) 21 19   SpO2: 100% 96% 100%    General: Vital signs reviewed.  Patient is  well-developed and well-nourished, in no acute distress and cooperative with exam.  Head: Normocephalic and atraumatic. Eyes: EOMI, conjunctivae normal, no scleral icterus.  ENMT: Mucous membranes are dry.  Cardiovascular: RRR, S1 normal, S2 normal, no murmurs, gallops, or rubs. Pulmonary/Chest: Few basal crackles bilaterally, no wheezes, Abdominal: Soft, non-tender, non-distended, BS +, no masses, organomegaly, or guarding present.  Extremities: No lower extremity edema bilaterally,  pulses symmetric and intact bilaterally. No cyanosis or clubbing. Neurological: A&O , Strength is normal and symmetric bilaterally, cranial nerve II-XII are grossly intact, no focal motor deficit, sensory intact to light touch bilaterally.  Skin: Warm, dry and intact. No rashes or erythema. Psychiatric: Normal mood and affect.   Labs on Admission: I have personally reviewed following labs and imaging studies  CBC: Recent Labs  Lab 10/07/19 1441  WBC 13.6*  NEUTROABS 10.7*  HGB 14.9  HCT 52.7*  MCV 103.5*  PLT 114*   Basic Metabolic Panel: Recent Labs  Lab 10/07/19 1441  NA 145  K 5.3*  CL 98  CO2 40*  GLUCOSE 130*  BUN 17  CREATININE 0.61  CALCIUM 8.9   GFR: CrCl  cannot be calculated (Unknown ideal weight.). Liver Function Tests: Recent Labs  Lab 10/07/19 1441  AST 29  ALT 29  ALKPHOS 91  BILITOT 0.9  PROT 7.3  ALBUMIN 3.4*   No results for input(s): LIPASE, AMYLASE in the last 168 hours. No results for input(s): AMMONIA in the last 168 hours. Coagulation Profile: Recent Labs  Lab 10/07/19 1545  INR 0.9   Cardiac Enzymes: No results for input(s): CKTOTAL, CKMB, CKMBINDEX, TROPONINI in the last 168 hours. BNP (last 3 results) No results for input(s): PROBNP in the last 8760 hours. HbA1C: No results for input(s): HGBA1C in the last 72 hours. CBG: No results for input(s): GLUCAP in the last 168 hours. Lipid Profile: No results for input(s): CHOL, HDL, LDLCALC, TRIG, CHOLHDL, LDLDIRECT in the last 72 hours. Thyroid Function Tests: Recent Labs    10/07/19 1441  TSH 0.811  FREET4 0.62   Anemia Panel: No results for input(s): VITAMINB12, FOLATE, FERRITIN, TIBC, IRON, RETICCTPCT in the last 72 hours. Urine analysis:    Component Value Date/Time   COLORURINE YELLOW (A) 10/07/2019 1441   APPEARANCEUR CLEAR (A) 10/07/2019 1441   APPEARANCEUR Hazy 08/22/2013 1111   LABSPEC 1.020 10/07/2019 1441   LABSPEC 1.016 08/22/2013 1111   PHURINE 5.0 10/07/2019 1441   GLUCOSEU NEGATIVE 10/07/2019 1441   GLUCOSEU Negative 08/22/2013 1111   HGBUR NEGATIVE 10/07/2019 1441   BILIRUBINUR NEGATIVE 10/07/2019 1441   BILIRUBINUR Negative 08/22/2013 1111   KETONESUR NEGATIVE 10/07/2019 1441   PROTEINUR 100 (A) 10/07/2019 1441   UROBILINOGEN 0.2 07/24/2014 0635   NITRITE NEGATIVE 10/07/2019 1441   LEUKOCYTESUR NEGATIVE 10/07/2019 1441   LEUKOCYTESUR 1+ 08/22/2013 1111    Radiological Exams on Admission: CT Head Wo Contrast  Result Date: 10/07/2019 CLINICAL DATA:  Unresponsive, now with neck pain. EXAM: CT HEAD WITHOUT CONTRAST CT CERVICAL SPINE WITHOUT CONTRAST TECHNIQUE: Multidetector CT imaging of the head and cervical spine was performed  following the standard protocol without intravenous contrast. Multiplanar CT image reconstructions of the cervical spine were also generated. COMPARISON:  10/17/2014 head CT FINDINGS: CT HEAD FINDINGS Brain: There is no evidence for acute hemorrhage, hydrocephalus, mass lesion, or abnormal extra-axial fluid collection. No definite CT evidence for acute infarction. Vascular: No hyperdense vessel or unexpected calcification. Skull: No evidence for fracture. No worrisome lytic or sclerotic lesion. Sinuses/Orbits: Air-fluid levels are  seen in the sphenoid and right frontal sinus mastoid air cells are clear. Visualized portions of the globes and intraorbital fat are unremarkable. Other: None. CT CERVICAL SPINE FINDINGS Alignment: Markedly motion degraded assessment of the mid cervical spine. Within this limitation no subluxation or dislocation evident. Skull base and vertebrae: Marked motion degradation from the C1 level through C3 and again in C4 through C6. Within this limitation, no definite acute fracture. Rotation of C1 on 2 is compatible with placement of the patient's head. Soft tissues and spinal canal: No prevertebral fluid or swelling. No visible canal hematoma. Disc levels:  Loss of disc height noted C5-6. Upper chest: Emphysema noted in the visualized portion of the upper lobes bilaterally. Other: None. IMPRESSION: 1. No acute intracranial abnormality. 2. Air-fluid levels in the sphenoid and right frontal sinuses suggest acute sinusitis. 3. Motion degraded CT assessment of the cervical spine shows no definite fracture or subluxation. If there is high clinical index of suspicion for cervical spine injury, repeat CT imaging may be warranted. 4. Degenerative disc disease at C5-6. 5. Emphysematous change noted in the visualized portion of the upper lobes. Electronically Signed   By: Kennith Center M.D.   On: 10/07/2019 15:23   CT Cervical Spine Wo Contrast  Result Date: 10/07/2019 CLINICAL DATA:  Unresponsive,  now with neck pain. EXAM: CT HEAD WITHOUT CONTRAST CT CERVICAL SPINE WITHOUT CONTRAST TECHNIQUE: Multidetector CT imaging of the head and cervical spine was performed following the standard protocol without intravenous contrast. Multiplanar CT image reconstructions of the cervical spine were also generated. COMPARISON:  10/17/2014 head CT FINDINGS: CT HEAD FINDINGS Brain: There is no evidence for acute hemorrhage, hydrocephalus, mass lesion, or abnormal extra-axial fluid collection. No definite CT evidence for acute infarction. Vascular: No hyperdense vessel or unexpected calcification. Skull: No evidence for fracture. No worrisome lytic or sclerotic lesion. Sinuses/Orbits: Air-fluid levels are seen in the sphenoid and right frontal sinus mastoid air cells are clear. Visualized portions of the globes and intraorbital fat are unremarkable. Other: None. CT CERVICAL SPINE FINDINGS Alignment: Markedly motion degraded assessment of the mid cervical spine. Within this limitation no subluxation or dislocation evident. Skull base and vertebrae: Marked motion degradation from the C1 level through C3 and again in C4 through C6. Within this limitation, no definite acute fracture. Rotation of C1 on 2 is compatible with placement of the patient's head. Soft tissues and spinal canal: No prevertebral fluid or swelling. No visible canal hematoma. Disc levels:  Loss of disc height noted C5-6. Upper chest: Emphysema noted in the visualized portion of the upper lobes bilaterally. Other: None. IMPRESSION: 1. No acute intracranial abnormality. 2. Air-fluid levels in the sphenoid and right frontal sinuses suggest acute sinusitis. 3. Motion degraded CT assessment of the cervical spine shows no definite fracture or subluxation. If there is high clinical index of suspicion for cervical spine injury, repeat CT imaging may be warranted. 4. Degenerative disc disease at C5-6. 5. Emphysematous change noted in the visualized portion of the upper  lobes. Electronically Signed   By: Kennith Center M.D.   On: 10/07/2019 15:23   DG Chest Portable 1 View  Result Date: 10/07/2019 CLINICAL DATA:  Unresponsive. EXAM: PORTABLE CHEST 1 VIEW COMPARISON:  09/21/2019 FINDINGS: 3:09 p.m. The cardio pericardial silhouette is enlarged. Diffuse interstitial and alveolar opacity noted bilaterally with underlying vascular congestion. Patchy airspace disease noted at the right base. No pleural effusion. The visualized bony structures of the thorax are intact. Telemetry leads overlie the chest. IMPRESSION:  1. Cardiomegaly with vascular congestion. 2. Diffuse interstitial and patchy areas of airspace opacity, most prominently at the right base. Features could reflect asymmetric edema or multifocal pneumonia. Electronically Signed   By: Misty Stanley M.D.   On: 10/07/2019 15:24    EKG: Independently reviewed.  Sinus rhythm with right bundle branch block, Q waves in lead III and aVF.  No acute change.  Assessment/Plan Active Problems:   * No active hospital problems. *   Acute on chronic hypercarbic respiratory failure.  Most likely not being compliant with home oxygen as she was found without oxygen.  Chest x-ray with bilateral diffuse interstitial infiltrate more prominent at right base, can be due to pulmonary vascular congestion or atypical pneumonia. Procalcitonin less than 0.1. COVID-19 testing pending-it was negative on 09/22/2019. She was given a dose of cefepime and vancomycin in ED. -No need to continue antibiotics at this time. -Check BNP. -Give her Lasix 40 mg. -Repeat chest x-ray tomorrow morning. -Continue with BiPAP for hypercarbia-try weaning off, patient become more responsive. -Admit to stepdown.  COPD.  She was given Solu-Medrol and magnesium sulfate along with breathing treatment.  No wheezing during my exam. -Continue home inhalers with as needed albuterol.  HFrEF.  EF of 40 to 45% according to echo done in October 2020. No lower  extremity edema with few basal crackles and chest x-ray with a possible vascular congestion. -Give her IV Lasix 40 mg daily instead of p.o. -Continue Lipitor. -Not on any ACE inhibitor or ARB or beta-blocker at home.  Neck pain/fibromyalgia. Not complaining of any pain at this time. -Continue home dose of tizanidine as needed. -Continue home dose of Topamax.  DVT prophylaxis: Lovenox Code Status: Full code Family Communication: No family at bedside. Disposition Plan: Pending improvement. Consults called: None Admission status: Inpatient   Lorella Nimrod MD [facility]  If 7PM-7AM, please contact night-coverage [email or web address]  10/07/2019, 5:28 PM   This record has been created using Systems analyst. Errors have been sought and corrected,but may not always be located. Such creation errors do not reflect on the standard of care.

## 2019-10-07 NOTE — ED Notes (Signed)
Bethany Villa, 2A, called  Reports capped until more staff

## 2019-10-07 NOTE — Consult Note (Signed)
PHARMACY -  BRIEF ANTIBIOTIC NOTE   Pharmacy has received consult(s) for Aztreonam/Vancomycin from an ED provider.  The patient's profile has been reviewed for ht/wt/allergies/indication/available labs.    One time order(s) placed for Vancomycin 1g IV x 1 and Cefepime 2g IV x 1  (pharmacy researched and pt has documented cephalosporin use without documented allergy)  Further antibiotics/pharmacy consults should be ordered by admitting physician if indicated.                       Thank you,  Albina Billet, PharmD, BCPS Clinical Pharmacist 10/07/2019 3:25 PM

## 2019-10-08 ENCOUNTER — Inpatient Hospital Stay: Payer: Medicare Other

## 2019-10-08 LAB — PROCALCITONIN: Procalcitonin: <0.1 ng/mL

## 2019-10-08 LAB — CBC
HCT: 44.2 % (ref 36.0–46.0)
Hemoglobin: 13.4 g/dL (ref 12.0–15.0)
MCH: 29.8 pg (ref 26.0–34.0)
MCHC: 30.3 g/dL (ref 30.0–36.0)
MCV: 98.2 fL (ref 80.0–100.0)
Platelets: 84 10*3/uL — ABNORMAL LOW (ref 150–400)
RBC: 4.5 MIL/uL (ref 3.87–5.11)
RDW: 13.2 % (ref 11.5–15.5)
WBC: 5.8 10*3/uL (ref 4.0–10.5)
nRBC: 0 % (ref 0.0–0.2)

## 2019-10-08 LAB — BASIC METABOLIC PANEL
Anion gap: 8 (ref 5–15)
BUN: 19 mg/dL (ref 8–23)
CO2: 38 mmol/L — ABNORMAL HIGH (ref 22–32)
Calcium: 8.6 mg/dL — ABNORMAL LOW (ref 8.9–10.3)
Chloride: 91 mmol/L — ABNORMAL LOW (ref 98–111)
Creatinine, Ser: 0.67 mg/dL (ref 0.44–1.00)
GFR calc Af Amer: >60 mL/min (ref >60)
GFR calc non Af Amer: >60 mL/min (ref >60)
Glucose, Bld: 125 mg/dL — ABNORMAL HIGH (ref 70–99)
Potassium: 3.6 mmol/L (ref 3.5–5.1)
Sodium: 137 mmol/L (ref 135–145)

## 2019-10-08 MED ORDER — UMECLIDINIUM BROMIDE 62.5 MCG/INH IN AEPB
1.0000 | INHALATION_SPRAY | Freq: Every day | RESPIRATORY_TRACT | Status: DC
  Administered 2019-10-08 – 2019-10-10 (×3): 1 via RESPIRATORY_TRACT
  Filled 2019-10-08: qty 7

## 2019-10-08 MED ORDER — FLUTICASONE FUROATE-VILANTEROL 100-25 MCG/INH IN AEPB
1.0000 | INHALATION_SPRAY | Freq: Every day | RESPIRATORY_TRACT | Status: DC
  Administered 2019-10-11: 1 via RESPIRATORY_TRACT
  Filled 2019-10-08: qty 28

## 2019-10-08 MED ORDER — ORAL CARE MOUTH RINSE
15.0000 mL | Freq: Two times a day (BID) | OROMUCOSAL | Status: DC
  Administered 2019-10-08 – 2019-10-10 (×2): 15 mL via OROMUCOSAL

## 2019-10-08 MED ORDER — IPRATROPIUM-ALBUTEROL 0.5-2.5 (3) MG/3ML IN SOLN
3.0000 mL | Freq: Four times a day (QID) | RESPIRATORY_TRACT | Status: DC | PRN
  Administered 2019-10-08: 3 mL via RESPIRATORY_TRACT
  Filled 2019-10-08: qty 3

## 2019-10-08 NOTE — ED Notes (Signed)
Rolly Salter, EDT asked to transport pt

## 2019-10-08 NOTE — Plan of Care (Signed)
?  Problem: Clinical Measurements: ?Goal: Respiratory complications will improve ?Outcome: Progressing ?Goal: Cardiovascular complication will be avoided ?Outcome: Progressing ?  ?Problem: Activity: ?Goal: Risk for activity intolerance will decrease ?Outcome: Progressing ?  ?Problem: Pain Managment: ?Goal: General experience of comfort will improve ?Outcome: Progressing ?  ?Problem: Safety: ?Goal: Ability to remain free from injury will improve ?Outcome: Progressing ?  ?

## 2019-10-08 NOTE — Progress Notes (Signed)
PROGRESS NOTE    Bethany Villa  HUD:[id number] DOB: November 29, 1955 DOA: 10/07/2019 PCP: [name], MD    Assessment & Plan:   Active Problems:   Acute hypercapnic respiratory failure (HCC)    Bethany Villa is a 64 y.o. female with medical history significant of COPD, chronic hypoxemic and hypercapnic respiratory failure on home oxygen, 2 L/min, chronic systolic CHF with EF of 40 to 85% in October 2020, chronic neck pain, chronic back pain, fibromyalgia, recently hospitalized jan 2021 forCOPD exacerbation. During this admissionPatient was deemed to be a candidate for trilogy machine for home use because of chronic hypoxia and hypercapnia. [facility] and hopefully this will be delivered to her house soon.  Patient was called out for unresponsiveness. Patient's last known normal was yesterday evening. Patient was found to be 88% on room air. Patient was given Narcan given that they removed a morphine patch without much improvement. They then treated a COPD exacerbation with magnesium, Solu-Medrol, epi, albuterol and Atrovent.      Acute on chronic hypercarbic respiratory failure.   Most likely not being compliant with home oxygen as she was found without oxygen.  Also contributed by her being on Buprenorphine patch. --Chest x-ray with bilateral diffuse interstitial infiltrate more prominent at right base, can be due to pulmonary vascular congestion or atypical pneumonia. Procalcitonin less than 0.1.  COVID-19 neg. She was given a dose of cefepime and vancomycin in ED. PLAN: -No need to continue antibiotics at this time. -continue Lasix 40 mg daily for now --continue suppl O2 to maintain 90-92% --Need to set up Trilogy --Caution high opioids use at home   COPD.  She was given Solu-Medrol and magnesium sulfate along with breathing treatment.  No wheezing during my exam. -Continue home inhalers with as needed  albuterol.  HFrEF.  EF of 40 to 45% according to echo done in October 2020. No lower extremity edema with few basal crackles and chest x-ray with a possible vascular congestion. -continue Lasix 40 mg daily for now -Continue Lipitor. -Not on any ACE inhibitor or ARB or beta-blocker at home.  Neck pain/fibromyalgia. Not complaining of any pain at this time. -Continue home dose of tizanidine as needed. -Continue home dose of Topamax. --Hold home Buprenorphine patch.   DVT prophylaxis: Lovenox SQ Code Status: Full code  Family Communication: not today Disposition Plan: Home 1-2 days   Subjective and Interval History:  Pt woke up this morning, and didn't remember what happened the previous day.  Got concerned when I told her her "morphine patch" was removed in the ED.  Pt said it wasn't a morphine patch but a "fibromyalgia patch", and wanted me to look for it because if it got thrown away, then we owe her $100 for the patch.    Breathing back to baseline.  No fever, dyspnea, cough, chest pain, abdominal pain, N/V/D, dysuria, increased swelling.   Objective: Vitals:   10/08/19 1638 10/08/19 1952 10/09/19 0452 10/09/19 0818  BP: 128/77 121/84 135/86 118/82  Pulse: 96 100 91 87  Resp: 16   18  Temp: 98.5 F (36.9 C) 98.3 F (36.8 C) 98.4 F (36.9 C)   TempSrc: Oral Oral Oral   SpO2: 93% 96% 98% 96%  Weight:      Height:        Intake/Output Summary (Last 24 hours) at 10/09/2019 0901 Last data filed at 10/08/2019 2237 Gross per 24 hour  Intake 480 ml  Output 1750 ml  Net -1270 ml   Filed Weights   10/08/19 0026  Weight: 83.1 kg    Examination:   Constitutional: NAD, AAOx3 HEENT: conjunctivae and lids normal, EOMI CV: RRR no M,R,G. Distal pulses +2.  No cyanosis.   RESP: reduced lung sounds, mild wheezes, normal respiratory effort, on 2L (baseline) GI: +BS, NTND Extremities: No effusions, edema, or tenderness in BLE SKIN: warm, dry and intact Neuro: II - XII  grossly intact.  Sensation intact Psych: Normal mood and affect.     Data Reviewed: I have personally reviewed following labs and imaging studies  CBC: Recent Labs  Lab 10/07/19 1441 10/08/19 0605  WBC 13.6* 5.8  NEUTROABS 10.7*  --   HGB 14.9 13.4  HCT 52.7* 44.2  MCV 103.5* 98.2  PLT 114* 84*   Basic Metabolic Panel: Recent Labs  Lab 10/07/19 1441 10/08/19 0605  NA 145 137  K 5.3* 3.6  CL 98 91*  CO2 40* 38*  GLUCOSE 130* 125*  BUN 17 19  CREATININE 0.61 0.67  CALCIUM 8.9 8.6*   GFR: Estimated Creatinine Clearance: 74.5 mL/min (by C-G formula based on SCr of 0.67 mg/dL). Liver Function Tests: Recent Labs  Lab 10/07/19 1441  AST 29  ALT 29  ALKPHOS 91  BILITOT 0.9  PROT 7.3  ALBUMIN 3.4*   No results for input(s): LIPASE, AMYLASE in the last 168 hours. No results for input(s): AMMONIA in the last 168 hours. Coagulation Profile: Recent Labs  Lab 10/07/19 1545  INR 0.9   Cardiac Enzymes: No results for input(s): CKTOTAL, CKMB, CKMBINDEX, TROPONINI in the last 168 hours. BNP (last 3 results) No results for input(s): PROBNP in the last 8760 hours. HbA1C: No results for input(s): HGBA1C in the last 72 hours. CBG: No results for input(s): GLUCAP in the last 168 hours. Lipid Profile: No results for input(s): CHOL, HDL, LDLCALC, TRIG, CHOLHDL, LDLDIRECT in the last 72 hours. Thyroid Function Tests: Recent Labs    10/07/19 1441  TSH 0.811  FREET4 0.62   Anemia Panel: No results for input(s): VITAMINB12, FOLATE, FERRITIN, TIBC, IRON, RETICCTPCT in the last 72 hours. Sepsis Labs: Recent Labs  Lab 10/07/19 1441 10/07/19 1658 10/08/19 0605 10/09/19 0502  PROCALCITON <0.10  --  <0.10 <0.10  LATICACIDVEN  --  1.1  --   --     Recent Results (from the past 240 hour(s))  Culture, blood (routine x 2)     Status: None (Preliminary result)   Collection Time: 10/07/19  3:18 PM   Specimen: BLOOD  Result Value Ref Range Status   Specimen Description  BLOOD BLOOD RIGHT WRIST  Final   Special Requests   Final    BOTTLES DRAWN AEROBIC AND ANAEROBIC Blood Culture results may not be optimal due to an inadequate volume of blood received in culture bottles   Culture   Final    NO GROWTH 2 DAYS Performed at [facility], [address], [place], [place] [address]    Report Status PENDING  Incomplete  Culture, blood (routine x 2)     Status: None (Preliminary result)   Collection Time: 10/07/19  3:23 PM   Specimen: BLOOD  Result Value Ref Range Status   Specimen Description BLOOD BLOOD RIGHT HAND  Final   Special Requests   Final    BOTTLES DRAWN AEROBIC AND ANAEROBIC Blood Culture results may not be optimal due to an inadequate volume of blood received in culture bottles   Culture   Final  NO GROWTH 2 DAYS Performed at [facility], [address] [place], [place] [address]    Report Status PENDING  Incomplete  Respiratory Panel by RT PCR (Flu A&B, Covid) - Nasopharyngeal Swab     Status: None   Collection Time: 10/07/19  3:23 PM   Specimen: Nasopharyngeal Swab  Result Value Ref Range Status   SARS Coronavirus 2 by RT PCR NEGATIVE NEGATIVE Final    Comment: (NOTE) SARS-CoV-2 target nucleic acids are NOT DETECTED. The SARS-CoV-2 RNA is generally detectable in upper respiratoy specimens during the acute phase of infection. The lowest concentration of SARS-CoV-2 viral copies this assay can detect is 131 copies/mL. A negative result does not preclude SARS-Cov-2 infection and should not be used as the sole basis for treatment or other patient management decisions. A negative result may occur with  improper specimen collection/handling, submission of specimen other than nasopharyngeal swab, presence of viral mutation(s) within the areas targeted by this assay, and inadequate number of viral copies (<131 copies/mL). A negative result must be combined with clinical observations, patient history, and epidemiological  information. The expected result is Negative. Fact Sheet for Patients:  PinkCheek.be Fact Sheet for Healthcare Providers:  GravelBags.it This test is not yet ap proved or cleared by the [place] FDA and  has been authorized for detection and/or diagnosis of SARS-CoV-2 by FDA under an Emergency Use Authorization (EUA). This EUA will remain  in effect (meaning this test can be used) for the duration of the COVID-19 declaration under Section 564(b)(1) of the Act, 21 U.S.C. section 360bbb-3(b)(1), unless the authorization is terminated or revoked sooner.    Influenza A by PCR NEGATIVE NEGATIVE Final   Influenza B by PCR NEGATIVE NEGATIVE Final    Comment: (NOTE) The Xpert Xpress SARS-CoV-2/FLU/RSV assay is intended as an aid in  the diagnosis of influenza from Nasopharyngeal swab specimens and  should not be used as a sole basis for treatment. Nasal washings and  aspirates are unacceptable for Xpert Xpress SARS-CoV-2/FLU/RSV  testing. Fact Sheet for Patients: PinkCheek.be Fact Sheet for Healthcare Providers: GravelBags.it This test is not yet approved or cleared by the [place] FDA and  has been authorized for detection and/or diagnosis of SARS-CoV-2 by  FDA under an Emergency Use Authorization (EUA). This EUA will remain  in effect (meaning this test can be used) for the duration of the  Covid-19 declaration under Section 564(b)(1) of the Act, 21  U.S.C. section 360bbb-3(b)(1), unless the authorization is  terminated or revoked. Performed at [facility], [address] [place], [place] [address]       Radiology Studies: X-ray chest PA and lateral  Result Date: 10/08/2019 CLINICAL DATA:  COPD, hypercapnic respiratory failure EXAM: CHEST - 2 VIEW COMPARISON:  Chest radiograph from one day prior. FINDINGS: Stable cardiomediastinal silhouette with  mild cardiomegaly. No pneumothorax. No pleural effusion. No overt pulmonary edema. No acute consolidative airspace disease. IMPRESSION: Stable mild cardiomegaly without overt pulmonary edema. No active pulmonary disease. Electronically Signed   By: Ilona Sorrel M.D.   On: 10/08/2019 08:00   CT Head Wo Contrast  Result Date: 10/07/2019 CLINICAL DATA:  Unresponsive, now with neck pain. EXAM: CT HEAD WITHOUT CONTRAST CT CERVICAL SPINE WITHOUT CONTRAST TECHNIQUE: Multidetector CT imaging of the head and cervical spine was performed following the standard protocol without intravenous contrast. Multiplanar CT image reconstructions of the cervical spine were also generated. COMPARISON:  10/17/2014 head CT FINDINGS: CT HEAD FINDINGS Brain: There is no evidence  for acute hemorrhage, hydrocephalus, mass lesion, or abnormal extra-axial fluid collection. No definite CT evidence for acute infarction. Vascular: No hyperdense vessel or unexpected calcification. Skull: No evidence for fracture. No worrisome lytic or sclerotic lesion. Sinuses/Orbits: Air-fluid levels are seen in the sphenoid and right frontal sinus mastoid air cells are clear. Visualized portions of the globes and intraorbital fat are unremarkable. Other: None. CT CERVICAL SPINE FINDINGS Alignment: Markedly motion degraded assessment of the mid cervical spine. Within this limitation no subluxation or dislocation evident. Skull base and vertebrae: Marked motion degradation from the C1 level through C3 and again in C4 through C6. Within this limitation, no definite acute fracture. Rotation of C1 on 2 is compatible with placement of the patient's head. Soft tissues and spinal canal: No prevertebral fluid or swelling. No visible canal hematoma. Disc levels:  Loss of disc height noted C5-6. Upper chest: Emphysema noted in the visualized portion of the upper lobes bilaterally. Other: None. IMPRESSION: 1. No acute intracranial abnormality. 2. Air-fluid levels in the  sphenoid and right frontal sinuses suggest acute sinusitis. 3. Motion degraded CT assessment of the cervical spine shows no definite fracture or subluxation. If there is high clinical index of suspicion for cervical spine injury, repeat CT imaging may be warranted. 4. Degenerative disc disease at C5-6. 5. Emphysematous change noted in the visualized portion of the upper lobes. Electronically Signed   By: Kennith Center M.D.   On: 10/07/2019 15:23   CT Cervical Spine Wo Contrast  Result Date: 10/07/2019 CLINICAL DATA:  Unresponsive, now with neck pain. EXAM: CT HEAD WITHOUT CONTRAST CT CERVICAL SPINE WITHOUT CONTRAST TECHNIQUE: Multidetector CT imaging of the head and cervical spine was performed following the standard protocol without intravenous contrast. Multiplanar CT image reconstructions of the cervical spine were also generated. COMPARISON:  10/17/2014 head CT FINDINGS: CT HEAD FINDINGS Brain: There is no evidence for acute hemorrhage, hydrocephalus, mass lesion, or abnormal extra-axial fluid collection. No definite CT evidence for acute infarction. Vascular: No hyperdense vessel or unexpected calcification. Skull: No evidence for fracture. No worrisome lytic or sclerotic lesion. Sinuses/Orbits: Air-fluid levels are seen in the sphenoid and right frontal sinus mastoid air cells are clear. Visualized portions of the globes and intraorbital fat are unremarkable. Other: None. CT CERVICAL SPINE FINDINGS Alignment: Markedly motion degraded assessment of the mid cervical spine. Within this limitation no subluxation or dislocation evident. Skull base and vertebrae: Marked motion degradation from the C1 level through C3 and again in C4 through C6. Within this limitation, no definite acute fracture. Rotation of C1 on 2 is compatible with placement of the patient's head. Soft tissues and spinal canal: No prevertebral fluid or swelling. No visible canal hematoma. Disc levels:  Loss of disc height noted C5-6. Upper  chest: Emphysema noted in the visualized portion of the upper lobes bilaterally. Other: None. IMPRESSION: 1. No acute intracranial abnormality. 2. Air-fluid levels in the sphenoid and right frontal sinuses suggest acute sinusitis. 3. Motion degraded CT assessment of the cervical spine shows no definite fracture or subluxation. If there is high clinical index of suspicion for cervical spine injury, repeat CT imaging may be warranted. 4. Degenerative disc disease at C5-6. 5. Emphysematous change noted in the visualized portion of the upper lobes. Electronically Signed   By: Kennith Center M.D.   On: 10/07/2019 15:23   DG Chest Portable 1 View  Result Date: 10/07/2019 CLINICAL DATA:  Unresponsive. EXAM: PORTABLE CHEST 1 VIEW COMPARISON:  09/21/2019 FINDINGS: 3:09 p.m. The cardio pericardial  silhouette is enlarged. Diffuse interstitial and alveolar opacity noted bilaterally with underlying vascular congestion. Patchy airspace disease noted at the right base. No pleural effusion. The visualized bony structures of the thorax are intact. Telemetry leads overlie the chest. IMPRESSION: 1. Cardiomegaly with vascular congestion. 2. Diffuse interstitial and patchy areas of airspace opacity, most prominently at the right base. Features could reflect asymmetric edema or multifocal pneumonia. Electronically Signed   By: Kennith Center M.D.   On: 10/07/2019 15:24     Scheduled Meds: . atorvastatin  20 mg Oral q1800  . enoxaparin (LOVENOX) injection  40 mg Subcutaneous Q24H  . fluticasone furoate-vilanterol  1 puff Inhalation Daily   Or  . umeclidinium bromide  1 puff Inhalation Daily  . furosemide  40 mg Intravenous Daily  . lidocaine  1 patch Transdermal Q24H  . mouth rinse  15 mL Mouth Rinse BID  . nicotine  21 mg Transdermal Daily  . sertraline  50 mg Oral Daily  . sodium chloride flush  3 mL Intravenous Q12H  . topiramate  25 mg Oral BID   Continuous Infusions:   LOS: 2 days     Darlin Priestly, MD [facility] If 7PM-7AM, please contact night-coverage 10/09/2019, 9:01 AM

## 2019-10-08 NOTE — Progress Notes (Signed)
CH visited pt. per OR for AD.  Pt. in bed watching television.  Pt. says she cannot remember how she got to the hospital; this frightens her and she said several times, 'I'm terrified of going home', not knowing what had happened to her.  Pt. requested help completing Living Will, and CH began discussing document; midway through discussion, Pt. expressed 'I didn't realize how heavy this was going to be... I'm not sure I'm ready for this right now'; pt. tearful; CH explored pt.'s feelings through active listening and learned pt. is afraid to leave her family behind.  Pt. is one of six sisters and has a brother; pt. lives alone but family all live within a few hours' drive.  CH left document w/pt. and suggested she discuss it w/her family whenever she felt ready to.  Pt. requested prayer; CH also provided Bible and devotional literature to offset morbidity of Living Will being only reading material in rm.; pt. grateful.  No further needs expressed at this time.       10/08/19 1600  Clinical Encounter Type  Visited With Patient;Health care provider  Visit Type Initial;Spiritual support;Social support (AD Education)  Referral From Nurse  Spiritual Encounters  Spiritual Needs Prayer;Emotional  Stress Factors  Patient Stress Factors Health changes;Lack of caregivers;Loss of control;Exhausted

## 2019-10-09 LAB — PROCALCITONIN: Procalcitonin: <0.1 ng/mL

## 2019-10-09 MED ORDER — OXYCODONE HCL 5 MG PO TABS
5.0000 mg | ORAL_TABLET | ORAL | Status: DC | PRN
  Administered 2019-10-09 (×4): 5 mg via ORAL
  Filled 2019-10-09 (×4): qty 1

## 2019-10-09 MED ORDER — ALPRAZOLAM 0.5 MG PO TABS
0.5000 mg | ORAL_TABLET | Freq: Two times a day (BID) | ORAL | Status: DC | PRN
  Administered 2019-10-10 – 2019-10-11 (×3): 0.5 mg via ORAL
  Filled 2019-10-09 (×3): qty 1

## 2019-10-09 NOTE — Progress Notes (Signed)
PROGRESS NOTE    Bethany Villa  DXI:[id number] DOB: 1956/08/27 DOA: 10/07/2019 PCP: [name], MD    Assessment & Plan:   Active Problems:   Acute hypercapnic respiratory failure (HCC)    Bethany Villa is a 64 y.o. female with medical history significant of COPD, chronic hypoxemic and hypercapnic respiratory failure on home oxygen, 2 L/min, chronic systolic CHF with EF of 40 to 76% in October 2020, chronic neck pain, chronic back pain, fibromyalgia, recently hospitalized jan 2021 forCOPD exacerbation. During this admissionPatient was deemed to be a candidate for trilogy machine for home use because of chronic hypoxia and hypercapnia. [facility] and hopefully this will be delivered to her house soon.  Patient was called out for unresponsiveness. Patient's last known normal was evening prior to presentation. Patient was found to be 88% on room air. Patient was given Narcan given that they removed a morphine patch without much improvement. They then treated a COPD exacerbation with magnesium, Solu-Medrol, epi, albuterol and Atrovent.      # Unresponsiveness, resolved # Acute on chronic hypoxic and hypercarbic respiratory failure.   --VBG pH 7.06, pCO2 >120.  Likely not being compliant with home oxygen as she was found without oxygen.  Also likely contributed by her being on Buprenorphine patch. --Chest x-ray with bilateral diffuse interstitial infiltrate more prominent at right base, can be due to pulmonary vascular congestion or atypical pneumonia. Procalcitonin less than 0.1.  COVID-19 neg. She was given a dose of cefepime and vancomycin in ED. -s/p Lasix 40 mg daily x2 PLAN: -No need to continue antibiotics at this time. --continue suppl O2 to maintain 90-92% --Need to set up Trilogy --Caution pain patch use at home   COPD, not in exacerbation She was given Solu-Medrol and magnesium sulfate along with breathing  treatment.  No wheezing during my exam. -Continue home inhalers with as needed albuterol nebs  HFrEF, not in exacerbation EF of 40 to 45% according to echo done in October 2020. No lower extremity edema with few basal crackles and chest x-ray with a possible vascular congestion.  Takes PO lasix as PRN at home. -s/p Lasix 40 mg daily x2 -Not on any ACE inhibitor or ARB or beta-blocker at home. PLAN: --d/c IV lasix.  Neck pain/fibromyalgia. -Continue home dose of tizanidine as needed. -Continue home dose of Topamax. --Hold home Buprenorphine patch, cautioned it's use given her chronic hypoxia and hypercapnia. --Oxycodone 5 mg PRN per pt request since her pain patch had been removed.  # Frequent falls at home --PT eval   DVT prophylaxis: Lovenox SQ Code Status: Full code  Family Communication: not today Disposition Plan: Home tomorrow after PT eval.     Subjective and Interval History:  Pt complained of her chronic pain without her pain patch, requested oxycodone.  No fever, dyspnea, chest pain, abdominal pain, N/V/D, dysuria, increased swelling.  Pt also reported falling a lot at home and requested PT.    Discussed with pt my concern that her pain patch together with her chronic hypoxia and hypercapnia is a dangerous combination.  Pt said she has worn her pain patch for a long time and never had a problem, and didn't want to consider my caution.   Objective: Vitals:   10/09/19 0818 10/09/19 1147 10/09/19 1715 10/09/19 2026  BP: 118/82 122/78 131/86 111/77  Pulse: 87 91 90 84  Resp: 18 16 19 20   Temp:  98.4 F (36.9 C) 98.1 F (36.7 C)  98.6 F (37 C)  TempSrc:  Oral Oral Oral  SpO2: 96% 97% 95% 99%  Weight:      Height:        Intake/Output Summary (Last 24 hours) at 10/09/2019 2046 Last data filed at 10/09/2019 1800 Gross per 24 hour  Intake 960 ml  Output 2150 ml  Net -1190 ml   Filed Weights   10/08/19 0026  Weight: 83.1 kg    Examination:    Constitutional: NAD, alert, oriented HEENT: conjunctivae and lids normal, EOMI CV: RRR no M,R,G. Distal pulses +2.  No cyanosis.   RESP: reduced lung sounds, mild wheezes, normal respiratory effort, on 2L (baseline) GI: +BS, NTND Extremities: No effusions, edema, or tenderness in BLE SKIN: warm, dry and intact Neuro: II - XII grossly intact.  Sensation intact Psych: Normal mood and affect.     Data Reviewed: I have personally reviewed following labs and imaging studies  CBC: Recent Labs  Lab 10/07/19 1441 10/08/19 0605  WBC 13.6* 5.8  NEUTROABS 10.7*  --   HGB 14.9 13.4  HCT 52.7* 44.2  MCV 103.5* 98.2  PLT 114* 84*   Basic Metabolic Panel: Recent Labs  Lab 10/07/19 1441 10/08/19 0605  NA 145 137  K 5.3* 3.6  CL 98 91*  CO2 40* 38*  GLUCOSE 130* 125*  BUN 17 19  CREATININE 0.61 0.67  CALCIUM 8.9 8.6*   GFR: Estimated Creatinine Clearance: 74.5 mL/min (by C-G formula based on SCr of 0.67 mg/dL). Liver Function Tests: Recent Labs  Lab 10/07/19 1441  AST 29  ALT 29  ALKPHOS 91  BILITOT 0.9  PROT 7.3  ALBUMIN 3.4*   No results for input(s): LIPASE, AMYLASE in the last 168 hours. No results for input(s): AMMONIA in the last 168 hours. Coagulation Profile: Recent Labs  Lab 10/07/19 1545  INR 0.9   Cardiac Enzymes: No results for input(s): CKTOTAL, CKMB, CKMBINDEX, TROPONINI in the last 168 hours. BNP (last 3 results) No results for input(s): PROBNP in the last 8760 hours. HbA1C: No results for input(s): HGBA1C in the last 72 hours. CBG: No results for input(s): GLUCAP in the last 168 hours. Lipid Profile: No results for input(s): CHOL, HDL, LDLCALC, TRIG, CHOLHDL, LDLDIRECT in the last 72 hours. Thyroid Function Tests: Recent Labs    10/07/19 1441  TSH 0.811  FREET4 0.62   Anemia Panel: No results for input(s): VITAMINB12, FOLATE, FERRITIN, TIBC, IRON, RETICCTPCT in the last 72 hours. Sepsis Labs: Recent Labs  Lab 10/07/19 1441  10/07/19 1658 10/08/19 0605 10/09/19 0502  PROCALCITON <0.10  --  <0.10 <0.10  LATICACIDVEN  --  1.1  --   --     Recent Results (from the past 240 hour(s))  Culture, blood (routine x 2)     Status: None (Preliminary result)   Collection Time: 10/07/19  3:18 PM   Specimen: BLOOD  Result Value Ref Range Status   Specimen Description BLOOD BLOOD RIGHT WRIST  Final   Special Requests   Final    BOTTLES DRAWN AEROBIC AND ANAEROBIC Blood Culture results may not be optimal due to an inadequate volume of blood received in culture bottles   Culture   Final    NO GROWTH 2 DAYS Performed at [facility], [address] [place], [place] [address]    Report Status PENDING  Incomplete  Culture, blood (routine x 2)     Status: None (Preliminary result)   Collection Time: 10/07/19  3:23 PM  Specimen: BLOOD  Result Value Ref Range Status   Specimen Description BLOOD BLOOD RIGHT HAND  Final   Special Requests   Final    BOTTLES DRAWN AEROBIC AND ANAEROBIC Blood Culture results may not be optimal due to an inadequate volume of blood received in culture bottles   Culture   Final    NO GROWTH 2 DAYS Performed at [facility], [address], [place], [place] [address]    Report Status PENDING  Incomplete  Respiratory Panel by RT PCR (Flu A&B, Covid) - Nasopharyngeal Swab     Status: None   Collection Time: 10/07/19  3:23 PM   Specimen: Nasopharyngeal Swab  Result Value Ref Range Status   SARS Coronavirus 2 by RT PCR NEGATIVE NEGATIVE Final    Comment: (NOTE) SARS-CoV-2 target nucleic acids are NOT DETECTED. The SARS-CoV-2 RNA is generally detectable in upper respiratoy specimens during the acute phase of infection. The lowest concentration of SARS-CoV-2 viral copies this assay can detect is 131 copies/mL. A negative result does not preclude SARS-Cov-2 infection and should not be used as the sole basis for treatment or other patient management decisions. A negative  result may occur with  improper specimen collection/handling, submission of specimen other than nasopharyngeal swab, presence of viral mutation(s) within the areas targeted by this assay, and inadequate number of viral copies (<131 copies/mL). A negative result must be combined with clinical observations, patient history, and epidemiological information. The expected result is Negative. Fact Sheet for Patients:  https://www.moore.com/ Fact Sheet for Healthcare Providers:  https://www.young.biz/ This test is not yet ap proved or cleared by the [place] FDA and  has been authorized for detection and/or diagnosis of SARS-CoV-2 by FDA under an Emergency Use Authorization (EUA). This EUA will remain  in effect (meaning this test can be used) for the duration of the COVID-19 declaration under Section 564(b)(1) of the Act, 21 U.S.C. section 360bbb-3(b)(1), unless the authorization is terminated or revoked sooner.    Influenza A by PCR NEGATIVE NEGATIVE Final   Influenza B by PCR NEGATIVE NEGATIVE Final    Comment: (NOTE) The Xpert Xpress SARS-CoV-2/FLU/RSV assay is intended as an aid in  the diagnosis of influenza from Nasopharyngeal swab specimens and  should not be used as a sole basis for treatment. Nasal washings and  aspirates are unacceptable for Xpert Xpress SARS-CoV-2/FLU/RSV  testing. Fact Sheet for Patients: https://www.moore.com/ Fact Sheet for Healthcare Providers: https://www.young.biz/ This test is not yet approved or cleared by the [place] FDA and  has been authorized for detection and/or diagnosis of SARS-CoV-2 by  FDA under an Emergency Use Authorization (EUA). This EUA will remain  in effect (meaning this test can be used) for the duration of the  Covid-19 declaration under Section 564(b)(1) of the Act, 21  U.S.C. section 360bbb-3(b)(1), unless the authorization is  terminated or  revoked. Performed at [facility], [address] [place], [place] [address]       Radiology Studies: X-ray chest PA and lateral  Result Date: 10/08/2019 CLINICAL DATA:  COPD, hypercapnic respiratory failure EXAM: CHEST - 2 VIEW COMPARISON:  Chest radiograph from one day prior. FINDINGS: Stable cardiomediastinal silhouette with mild cardiomegaly. No pneumothorax. No pleural effusion. No overt pulmonary edema. No acute consolidative airspace disease. IMPRESSION: Stable mild cardiomegaly without overt pulmonary edema. No active pulmonary disease. Electronically Signed   By: Delbert Phenix M.D.   On: 10/08/2019 08:00     Scheduled Meds: . atorvastatin  20 mg Oral  q1800  . enoxaparin (LOVENOX) injection  40 mg Subcutaneous Q24H  . fluticasone furoate-vilanterol  1 puff Inhalation Daily   Or  . umeclidinium bromide  1 puff Inhalation Daily  . furosemide  40 mg Intravenous Daily  . lidocaine  1 patch Transdermal Q24H  . mouth rinse  15 mL Mouth Rinse BID  . nicotine  21 mg Transdermal Daily  . sertraline  50 mg Oral Daily  . sodium chloride flush  3 mL Intravenous Q12H  . topiramate  25 mg Oral BID   Continuous Infusions:   LOS: 2 days     Enzo Bi, MD [facility] If 7PM-7AM, please contact night-coverage 10/09/2019, 8:46 PM

## 2019-10-10 DIAGNOSIS — J449 Chronic obstructive pulmonary disease, unspecified: Secondary | ICD-10-CM

## 2019-10-10 LAB — BASIC METABOLIC PANEL
Anion gap: 11 (ref 5–15)
BUN: 19 mg/dL (ref 8–23)
CO2: 33 mmol/L — ABNORMAL HIGH (ref 22–32)
Calcium: 8.9 mg/dL (ref 8.9–10.3)
Chloride: 96 mmol/L — ABNORMAL LOW (ref 98–111)
Creatinine, Ser: 0.56 mg/dL (ref 0.44–1.00)
GFR calc Af Amer: >60 mL/min (ref >60)
GFR calc non Af Amer: >60 mL/min (ref >60)
Glucose, Bld: 137 mg/dL — ABNORMAL HIGH (ref 70–99)
Potassium: 2.8 mmol/L — ABNORMAL LOW (ref 3.5–5.1)
Sodium: 140 mmol/L (ref 135–145)

## 2019-10-10 LAB — CBC
HCT: 48.3 % — ABNORMAL HIGH (ref 36.0–46.0)
Hemoglobin: 15.5 g/dL — ABNORMAL HIGH (ref 12.0–15.0)
MCH: 29.6 pg (ref 26.0–34.0)
MCHC: 32.1 g/dL (ref 30.0–36.0)
MCV: 92.2 fL (ref 80.0–100.0)
Platelets: 94 10*3/uL — ABNORMAL LOW (ref 150–400)
RBC: 5.24 MIL/uL — ABNORMAL HIGH (ref 3.87–5.11)
RDW: 13.5 % (ref 11.5–15.5)
WBC: 6.7 10*3/uL (ref 4.0–10.5)
nRBC: 0 % (ref 0.0–0.2)

## 2019-10-10 LAB — MAGNESIUM: Magnesium: 2 mg/dL (ref 1.7–2.4)

## 2019-10-10 MED ORDER — OXYCODONE HCL 5 MG PO TABS
5.0000 mg | ORAL_TABLET | Freq: Four times a day (QID) | ORAL | Status: DC | PRN
  Administered 2019-10-10 (×2): 5 mg via ORAL
  Filled 2019-10-10 (×3): qty 1

## 2019-10-10 MED ORDER — POTASSIUM CHLORIDE 10 MEQ/100ML IV SOLN
10.0000 meq | INTRAVENOUS | Status: AC
  Administered 2019-10-10: 10 meq via INTRAVENOUS
  Filled 2019-10-10 (×3): qty 100

## 2019-10-10 MED ORDER — POTASSIUM CHLORIDE CRYS ER 20 MEQ PO TBCR
40.0000 meq | EXTENDED_RELEASE_TABLET | Freq: Two times a day (BID) | ORAL | Status: AC
  Administered 2019-10-10 (×2): 40 meq via ORAL
  Filled 2019-10-10 (×2): qty 2

## 2019-10-10 MED ORDER — POTASSIUM CHLORIDE CRYS ER 20 MEQ PO TBCR
40.0000 meq | EXTENDED_RELEASE_TABLET | Freq: Once | ORAL | Status: AC
  Administered 2019-10-10: 40 meq via ORAL
  Filled 2019-10-10: qty 2

## 2019-10-10 MED ORDER — BUSPIRONE HCL 5 MG PO TABS
5.0000 mg | ORAL_TABLET | Freq: Three times a day (TID) | ORAL | Status: DC
  Administered 2019-10-10 – 2019-10-11 (×4): 5 mg via ORAL
  Filled 2019-10-10 (×6): qty 1

## 2019-10-10 MED ORDER — OXYCODONE HCL 5 MG PO TABS
5.0000 mg | ORAL_TABLET | ORAL | Status: DC | PRN
  Administered 2019-10-10 – 2019-10-11 (×6): 5 mg via ORAL
  Filled 2019-10-10 (×5): qty 1

## 2019-10-10 MED ORDER — ALBUTEROL SULFATE (2.5 MG/3ML) 0.083% IN NEBU: 2.5000 mg | INHALATION_SOLUTION | Freq: Four times a day (QID) | RESPIRATORY_TRACT | Status: DC | PRN

## 2019-10-10 NOTE — Evaluation (Signed)
Physical Therapy Evaluation Patient Details Name: Bethany Villa MRN: [id number] DOB: 1956-08-09 Today's Date: 10/10/2019   History of Present Illness  Pt is a 64 y.o. female that presented to ED via EMS from home, unresponsive.  PMH significant of COPD, chronic hypoxemic and hypercapnic respiratory failure on home oxygen, 2 L/min, chronic systolic CHF with EF of 40 to 95% in October 2020, chronic neck pain, chronic back pain, depression, fibromyalgia, recently hospitalized jan 2021 for COPD exacerbation.    Clinical Impression  Pt alert, oriented, behavior WFLs throughout session. Pt reported PLOF as independent/modI, furniture walks/borrows a friends rollator. Does endorse some falls in the last 6 months, has groceries/prescriptions delivered, family/neighbors check in intermittently.  The patient performed bed mobility mod I, able to sit with good balance. Several transfers performed from varied surfaces, CGA/supervision and cueing for hand placement for safe use of RW. The patient ambulated ~31ft total with RW and supervision, reported some fatigue, spO2>94% throughout on 2L via [place]. Pt and PT also reviewed activity pacing/energy conservation techniques as well as discussed pt's fear of falling/being home alone.  Overall the patient demonstrated deficits (see "PT Problem List") that impede the patient's functional abilities, safety, and mobility and would benefit from skilled PT intervention. Recommendation is HHPT with intermittent supervision.      Follow Up Recommendations Home health PT;Supervision - Intermittent    Equipment Recommendations  Other (comment)(4 wheeled walker, rollator)    Recommendations for Other Services       Precautions / Restrictions Precautions Precaution Comments: watch 02 Restrictions Weight Bearing Restrictions: No      Mobility  Bed Mobility Overal bed mobility: Modified Independent                Transfers Overall transfer level: Needs  assistance Equipment used: Rolling walker (2 wheeled) Transfers: Sit to/from Stand Sit to Stand: Supervision         General transfer comment: for verbal cueing for hand placement safety  Ambulation/Gait   Gait Distance (Feet): 50 Feet(~29ft, and then 58ft in room) Assistive device: Rolling walker (2 wheeled)       General Gait Details: overall steady, safe. decreased gait velocity noted  Stairs            Wheelchair Mobility    Modified Rankin (Stroke Patients Only)       Balance Overall balance assessment: Needs assistance Sitting-balance support: Feet supported Sitting balance-Leahy Scale: Good     Standing balance support: During functional activity Standing balance-Leahy Scale: Fair                               Pertinent Vitals/Pain Pain Assessment: No/denies pain    Home Living Family/patient expects to be discharged to:: Private residence Living Arrangements: Alone Available Help at Discharge: Friend(s);Available PRN/intermittently;Family Type of Home: Apartment Home Access: Stairs to enter;Level entry Entrance Stairs-Rails: None   Home Layout: One level Home Equipment: Shower seat;Grab bars - tub/shower;Grab bars - toilet;Cane - single point Additional Comments: O2 at home on 2L. Pt reported a few falls in the last 6 months.    Prior Function Level of Independence: Needs assistance   Gait / Transfers Assistance Needed: pt has borrowed a friends rollator in the past. furniture walks  ADL's / Homemaking Assistance Needed: independent/modI for ADLs, has groceries, prescriptions delivered. sits to do dishes        Hand Dominance   Dominant Hand: Right  Extremity/Trunk Assessment   Upper Extremity Assessment Upper Extremity Assessment: (grossly 4-/5)    Lower Extremity Assessment Lower Extremity Assessment: RLE deficits/detail;LLE deficits/detail RLE Deficits / Details: grossly 4-/5 LLE Deficits / Details: grossly  4-/5    Cervical / Trunk Assessment Cervical / Trunk Assessment: Normal  Communication   Communication: No difficulties  Cognition Arousal/Alertness: Awake/alert Behavior During Therapy: WFL for tasks assessed/performed Overall Cognitive Status: Within Functional Limits for tasks assessed                                        General Comments      Exercises Other Exercises Other Exercises: Pt performed BSC transfer with supervision, cueing for hand placement. modI for pericare. Other Exercises: Pt and pt discussed energy conservation/activity pacing, use of DME, and fear of falling/discharging home.   Assessment/Plan    PT Assessment Patient needs continued PT services  PT Problem List Decreased strength;Decreased mobility;Decreased activity tolerance;Decreased balance;Cardiopulmonary status limiting activity       PT Treatment Interventions DME instruction;Therapeutic exercise;Gait training;Balance training;Neuromuscular re-education;Functional mobility training;Therapeutic activities;Patient/family education    PT Goals (Current goals can be found in the Care Plan section)  Acute Rehab PT Goals Patient Stated Goal: to get stronger PT Goal Formulation: With patient Time For Goal Achievement: 10/24/19 Potential to Achieve Goals: Good    Frequency Min 2X/week   Barriers to discharge        Co-evaluation               AM-PAC PT "6 Clicks" Mobility  Outcome Measure Help needed turning from your back to your side while in a flat bed without using bedrails?: A Little Help needed moving from lying on your back to sitting on the side of a flat bed without using bedrails?: A Little Help needed moving to and from a bed to a chair (including a wheelchair)?: A Little Help needed standing up from a chair using your arms (e.g., wheelchair or bedside chair)?: A Little Help needed to walk in hospital room?: A Little Help needed climbing 3-5 steps with a  railing? : A Lot 6 Click Score: 17    End of Session Equipment Utilized During Treatment: Gait belt;Oxygen(2L) Activity Tolerance: Patient tolerated treatment well Patient left: in chair;with chair alarm set;with call bell/phone within reach Nurse Communication: Mobility status PT Visit Diagnosis: Other abnormalities of gait and mobility (R26.89);Muscle weakness (generalized) (M62.81);Difficulty in walking, not elsewhere classified (R26.2)    Time: 1751-0258 PT Time Calculation (min) (ACUTE ONLY): 34 min   Charges:   PT Evaluation $PT Eval Low Complexity: 1 Low PT Treatments $Therapeutic Exercise: 8-22 mins $Therapeutic Activity: 8-22 mins        Lieutenant Diego PT, DPT 11:12 AM,10/10/19

## 2019-10-10 NOTE — Plan of Care (Signed)
  Problem: Clinical Measurements: Goal: Ability to maintain clinical measurements within normal limits will improve Outcome: Progressing   Problem: Activity: Goal: Risk for activity intolerance will decrease Outcome: Progressing   Problem: Elimination: Goal: Will not experience complications related to bowel motility Outcome: Progressing Goal: Will not experience complications related to urinary retention Outcome: Progressing   Problem: Safety: Goal: Ability to remain free from injury will improve Outcome: Progressing   

## 2019-10-10 NOTE — Clinical Social Work Note (Signed)
NIV Patient continues to exhibit signs of hypercapnia associated with chronic respiratory failure secondary to severe COPD. Patient requires the use of NIV both QHS and daytime to help with exacerbation periods. The use of the NIV will treat patient's high PCO2 levels and can reduce risk of exacerbations and future hospitalizations when used at night and during the day. Pt will need these advanced settings in conjunction with her current medication regimen; BIPAP is not an option due to its functional limitations and the severity of the patient's condition. Failure to have NIV available for use over a 24 hour period could lead to death. Patient able to maintain their own airway and clear their own secretions.

## 2019-10-10 NOTE — Care Management Important Message (Signed)
Important Message  Patient Details  Name: Bethany Villa MRN: [id number] Date of Birth: [birth year]   Medicare Important Message Given:  Yes     [name] 10/10/2019, 11:42 AM

## 2019-10-10 NOTE — Progress Notes (Signed)
Pt complains of pain with IV potassium infusion. IV site changed and pt still complains. MD notified. Orders to discontinue IV and add PO potassium. I will continue to assess.

## 2019-10-10 NOTE — Progress Notes (Signed)
PROGRESS NOTE    Bethany Villa  QHU:[id number] DOB: 22-Dec-1955 DOA: 10/07/2019 PCP: [name], MD   Brief Narrative:  Bethany Villa a 64 y.o.femalewith medical history significant ofCOPD, chronic hypoxemic and hypercapnic respiratory failure on home oxygen, 2 L/min, chronic systolic CHF with EF of 40 to 46% in October 2020, chronic neck pain, chronic back pain, fibromyalgia, recently hospitalized jan 2021 forCOPD exacerbation. During this admissionPatient was deemed to be a candidate for trilogy machine for home use because of chronic hypoxia and hypercapnia. [facility] and hopefully this will be delivered to her house soon.  Patient was called out for unresponsiveness. Patient's last known normal was evening prior to presentation. Patient was found to be 88% on room air. Patient was given Narcan given that they removed a morphine patch without much improvement. They then treated a COPD exacerbation with magnesium, Solu-Medrol, epi, albuterol and Atrovent.    2/10: Patient seen and evaluated.  Endorses some significant symptomatic improvement.  On 2 L supplemental oxygen.  Not short of breath.  Able to speak in complete sentences.   Assessment & Plan:   Active Problems:   Acute hypercapnic respiratory failure (HCC)  Unresponsiveness, resolved Acute on chronic hypoxic and hypercarbic respiratory failure. --VBG pH 7.06, pCO2 >120.  Likely not being compliant with home oxygen as she was found without oxygen.  Also likely contributed by her being on Buprenorphine patch. --Chest x-ray with bilateral diffuse interstitial infiltrate more prominent at right base,can be due to pulmonary vascular congestion or atypical pneumonia. Procalcitonin less than 0.1.  COVID-19 neg. She was given a dose of cefepime and vancomycin in ED. -s/p Lasix 40 mg daily x2 PLAN: -No need to continue antibiotics at this time. --continue suppl  O2 to maintain 90-92% --Need to set up Trilogy, case management aware  --Caution pain patch use at home.  COPD, not in exacerbation She was given Solu-Medrol and magnesium sulfate along with breathing treatment. No wheezing during my exam. -Continue home inhalers with as needed albuterol nebs  HFrEF, not in exacerbation EF of 40 to 45% according to echo done in October 2020. No lower extremity edema with few basal crackles and chest x-ray with a possible vascular congestion.  Takes PO lasix as PRN at home. -s/p Lasix 40 mg daily x2 -Not on any ACE inhibitor or ARB or beta-blocker at home. PLAN: --d/c IV lasix. --Can resume PO PRN lasix at home  Neck pain/fibromyalgia. -Continue home dose of tizanidine as needed. -Continue home dose of Topamax. --Hold home Buprenorphine patch, cautioned it's use given her chronic hypoxia and hypercapnia. --Oxycodone 5 mg PRN per pt request since her pain patch had been removed.  Frequent falls at home --PT eval: rec HH PT  DVT prophylaxis: Lovenox Code Status: Full code Family Communication: none today Disposition Plan: Home tomorrow AM if respiratory status remains stable and arrangement of home health  Consultants:   none  Procedures:   none  Antimicrobials:   none   Subjective: Seen and examined Symptoms improved No complaints  Objective: Vitals:   10/10/19 0325 10/10/19 0730 10/10/19 1058 10/10/19 1205  BP: 113/84 106/77  (!) 116/95  Pulse: 92 89  82  Resp: 20 16  16   Temp: 98.4 F (36.9 C) 97.9 F (36.6 C)  98.9 F (37.2 C)  TempSrc: Oral Oral  Oral  SpO2: 98% 96% 96% 99%  Weight:      Height:        Intake/Output  Summary (Last 24 hours) at 10/10/2019 1504 Last data filed at 10/10/2019 1357 Gross per 24 hour  Intake 480 ml  Output 250 ml  Net 230 ml   Filed Weights   10/08/19 0026  Weight: 83.1 kg    Examination:  Constitutional: NAD, alert, oriented HEENT: conjunctivae and lids normal, EOMI CV:  RRR no M,R,G. Distal pulses +2.  No cyanosis.   RESP: reduced lung sounds, mild wheezes, normal respiratory effort, on 2L (baseline) GI: +BS, NTND Extremities: No effusions, edema, or tenderness in BLE SKIN: warm, dry and intact Neuro: II - XII grossly intact.  Sensation intact Psych: Normal mood and affect.      Data Reviewed: I have personally reviewed following labs and imaging studies  CBC: Recent Labs  Lab 10/07/19 1441 10/08/19 0605 10/10/19 0702  WBC 13.6* 5.8 6.7  NEUTROABS 10.7*  --   --   HGB 14.9 13.4 15.5*  HCT 52.7* 44.2 48.3*  MCV 103.5* 98.2 92.2  PLT 114* 84* 94*   Basic Metabolic Panel: Recent Labs  Lab 10/07/19 1441 10/08/19 0605 10/10/19 0702  NA 145 137 140  K 5.3* 3.6 2.8*  CL 98 91* 96*  CO2 40* 38* 33*  GLUCOSE 130* 125* 137*  BUN 17 19 19   CREATININE 0.61 0.67 0.56  CALCIUM 8.9 8.6* 8.9  MG  --   --  2.0   GFR: Estimated Creatinine Clearance: 74.5 mL/min (by C-G formula based on SCr of 0.56 mg/dL). Liver Function Tests: Recent Labs  Lab 10/07/19 1441  AST 29  ALT 29  ALKPHOS 91  BILITOT 0.9  PROT 7.3  ALBUMIN 3.4*   No results for input(s): LIPASE, AMYLASE in the last 168 hours. No results for input(s): AMMONIA in the last 168 hours. Coagulation Profile: Recent Labs  Lab 10/07/19 1545  INR 0.9   Cardiac Enzymes: No results for input(s): CKTOTAL, CKMB, CKMBINDEX, TROPONINI in the last 168 hours. BNP (last 3 results) No results for input(s): PROBNP in the last 8760 hours. HbA1C: No results for input(s): HGBA1C in the last 72 hours. CBG: No results for input(s): GLUCAP in the last 168 hours. Lipid Profile: No results for input(s): CHOL, HDL, LDLCALC, TRIG, CHOLHDL, LDLDIRECT in the last 72 hours. Thyroid Function Tests: No results for input(s): TSH, T4TOTAL, FREET4, T3FREE, THYROIDAB in the last 72 hours. Anemia Panel: No results for input(s): VITAMINB12, FOLATE, FERRITIN, TIBC, IRON, RETICCTPCT in the last 72  hours. Sepsis Labs: Recent Labs  Lab 10/07/19 1441 10/07/19 1658 10/08/19 0605 10/09/19 0502  PROCALCITON <0.10  --  <0.10 <0.10  LATICACIDVEN  --  1.1  --   --     Recent Results (from the past 240 hour(s))  Culture, blood (routine x 2)     Status: None (Preliminary result)   Collection Time: 10/07/19  3:18 PM   Specimen: BLOOD  Result Value Ref Range Status   Specimen Description BLOOD BLOOD RIGHT WRIST  Final   Special Requests   Final    BOTTLES DRAWN AEROBIC AND ANAEROBIC Blood Culture results may not be optimal due to an inadequate volume of blood received in culture bottles   Culture   Final    NO GROWTH 3 DAYS Performed at [facility], [address] [place], [place] [address]    Report Status PENDING  Incomplete  Culture, blood (routine x 2)     Status: None (Preliminary result)   Collection Time: 10/07/19  3:23 PM   Specimen: BLOOD  Result  Value Ref Range Status   Specimen Description BLOOD BLOOD RIGHT HAND  Final   Special Requests   Final    BOTTLES DRAWN AEROBIC AND ANAEROBIC Blood Culture results may not be optimal due to an inadequate volume of blood received in culture bottles   Culture   Final    NO GROWTH 3 DAYS Performed at [facility], [address], [place], [place] [address]    Report Status PENDING  Incomplete  Respiratory Panel by RT PCR (Flu A&B, Covid) - Nasopharyngeal Swab     Status: None   Collection Time: 10/07/19  3:23 PM   Specimen: Nasopharyngeal Swab  Result Value Ref Range Status   SARS Coronavirus 2 by RT PCR NEGATIVE NEGATIVE Final    Comment: (NOTE) SARS-CoV-2 target nucleic acids are NOT DETECTED. The SARS-CoV-2 RNA is generally detectable in upper respiratoy specimens during the acute phase of infection. The lowest concentration of SARS-CoV-2 viral copies this assay can detect is 131 copies/mL. A negative result does not preclude SARS-Cov-2 infection and should not be used as the sole basis for treatment  or other patient management decisions. A negative result may occur with  improper specimen collection/handling, submission of specimen other than nasopharyngeal swab, presence of viral mutation(s) within the areas targeted by this assay, and inadequate number of viral copies (<131 copies/mL). A negative result must be combined with clinical observations, patient history, and epidemiological information. The expected result is Negative. Fact Sheet for Patients:  https://www.moore.com/ Fact Sheet for Healthcare Providers:  https://www.young.biz/ This test is not yet ap proved or cleared by the [place] FDA and  has been authorized for detection and/or diagnosis of SARS-CoV-2 by FDA under an Emergency Use Authorization (EUA). This EUA will remain  in effect (meaning this test can be used) for the duration of the COVID-19 declaration under Section 564(b)(1) of the Act, 21 U.S.C. section 360bbb-3(b)(1), unless the authorization is terminated or revoked sooner.    Influenza A by PCR NEGATIVE NEGATIVE Final   Influenza B by PCR NEGATIVE NEGATIVE Final    Comment: (NOTE) The Xpert Xpress SARS-CoV-2/FLU/RSV assay is intended as an aid in  the diagnosis of influenza from Nasopharyngeal swab specimens and  should not be used as a sole basis for treatment. Nasal washings and  aspirates are unacceptable for Xpert Xpress SARS-CoV-2/FLU/RSV  testing. Fact Sheet for Patients: https://www.moore.com/ Fact Sheet for Healthcare Providers: https://www.young.biz/ This test is not yet approved or cleared by the [place] FDA and  has been authorized for detection and/or diagnosis of SARS-CoV-2 by  FDA under an Emergency Use Authorization (EUA). This EUA will remain  in effect (meaning this test can be used) for the duration of the  Covid-19 declaration under Section 564(b)(1) of the Act, 21  U.S.C. section  360bbb-3(b)(1), unless the authorization is  terminated or revoked. Performed at [facility], [address] [place], [place] [address]          Radiology Studies: No results found.      Scheduled Meds: . atorvastatin  20 mg Oral q1800  . busPIRone  5 mg Oral TID  . enoxaparin (LOVENOX) injection  40 mg Subcutaneous Q24H  . fluticasone furoate-vilanterol  1 puff Inhalation Daily   Or  . umeclidinium bromide  1 puff Inhalation Daily  . lidocaine  1 patch Transdermal Q24H  . mouth rinse  15 mL Mouth Rinse BID  . nicotine  21 mg Transdermal Daily  . potassium chloride  40 mEq  Oral BID  . sertraline  50 mg Oral Daily  . sodium chloride flush  3 mL Intravenous Q12H  . topiramate  25 mg Oral BID   Continuous Infusions:   LOS: 3 days    Time spent: 35 minutes    Tresa Moore, MD [facility] Pager [phone] If 7PM-7AM, please contact night-coverage 10/10/2019, 3:04 PM

## 2019-10-11 DIAGNOSIS — R4189 Other symptoms and signs involving cognitive functions and awareness: Secondary | ICD-10-CM

## 2019-10-11 DIAGNOSIS — T40601A Poisoning by unspecified narcotics, accidental (unintentional), initial encounter: Secondary | ICD-10-CM

## 2019-10-11 LAB — CBC
HCT: 46 % (ref 36.0–46.0)
Hemoglobin: 14.6 g/dL (ref 12.0–15.0)
MCH: 29 pg (ref 26.0–34.0)
MCHC: 31.7 g/dL (ref 30.0–36.0)
MCV: 91.5 fL (ref 80.0–100.0)
Platelets: 102 10*3/uL — ABNORMAL LOW (ref 150–400)
RBC: 5.03 MIL/uL (ref 3.87–5.11)
RDW: 13.8 % (ref 11.5–15.5)
WBC: 8.1 10*3/uL (ref 4.0–10.5)
nRBC: 0 % (ref 0.0–0.2)

## 2019-10-11 LAB — MAGNESIUM: Magnesium: 2 mg/dL (ref 1.7–2.4)

## 2019-10-11 LAB — BASIC METABOLIC PANEL
Anion gap: 9 (ref 5–15)
BUN: 20 mg/dL (ref 8–23)
CO2: 29 mmol/L (ref 22–32)
Calcium: 8.9 mg/dL (ref 8.9–10.3)
Chloride: 102 mmol/L (ref 98–111)
Creatinine, Ser: 0.63 mg/dL (ref 0.44–1.00)
GFR calc Af Amer: >60 mL/min (ref >60)
GFR calc non Af Amer: >60 mL/min (ref >60)
Glucose, Bld: 107 mg/dL — ABNORMAL HIGH (ref 70–99)
Potassium: 4 mmol/L (ref 3.5–5.1)
Sodium: 140 mmol/L (ref 135–145)

## 2019-10-11 NOTE — Plan of Care (Signed)

## 2019-10-11 NOTE — TOC Initial Note (Signed)
Transition of Care [facility]) - Initial/Assessment Note    Patient Details  Name: Bethany Villa MRN: [id number] Date of Birth: 1956-03-22  [facility]) CM/SW Contact:    Maree Krabbe, LCSW Phone Number: 10/11/2019, 8:59 AM  Clinical Narrative:   PT is alert and oriented. Pt is agreeable to home health. Pt has been accepted by [facility]--pt is agreeable and appreciative. Pt will also go home with a triology and rollator.                 Expected Discharge Plan: Home w Home Health Services Barriers to Discharge: Continued Medical Work up   Patient Goals and CMS Choice Patient states their goals for this hospitalization and ongoing recovery are:: to go home   Choice offered to / list presented to : Patient  Expected Discharge Plan and Services Expected Discharge Plan: Home w Home Health Services In-house Referral: NA   Post Acute Care Choice: Home Health Living arrangements for the past 2 months: Single Family Home                 DME Arranged: NIV, Walker rolling with seat DME Agency: [employer] Date DME Agency Contacted: 10/11/19 Time DME Agency Contacted: [phone] Representative spoke with at DME Agency: brad HH Arranged: PT, RN HH Agency: [facility] ([facility] Date HH Agency Contacted: 10/11/19 Time HH Agency Contacted: [phone] Representative spoke with at [facility] Agency: Rosey Bath  Prior Living Arrangements/Services Living arrangements for the past 2 months: Single Family Home Lives with:: Self Patient language and need for interpreter reviewed:: Yes Do you feel safe going back to the place where you live?: Yes      Need for Family Participation in Patient Care: Yes (Comment) Care giver support system in place?: Yes (comment)   Criminal Activity/Legal Involvement Pertinent to Current Situation/Hospitalization: No - Comment as needed  Activities of Daily Living Home Assistive Devices/Equipment: Oxygen, Walker (specify type), Shower chair  with back, Grab bars in shower, Nebulizer, Eyeglasses, Dentures (specify type), Grab bars around toilet ADL Screening (condition at time of admission) Patient's cognitive ability adequate to safely complete daily activities?: Yes Is the patient deaf or have difficulty hearing?: No Does the patient have difficulty seeing, even when wearing glasses/contacts?: No Does the patient have difficulty concentrating, remembering, or making decisions?: No Patient able to express need for assistance with ADLs?: No Does the patient have difficulty dressing or bathing?: No Independently performs ADLs?: Yes (appropriate for developmental age) Does the patient have difficulty walking or climbing stairs?: Yes Weakness of Legs: None Weakness of Arms/Hands: None  Permission Sought/Granted Permission sought to share information with : Family Supports    Share Information with NAME: matthew  Permission granted to share info w [facility]ENC[facility]  Permission granted to share info w Relationship: son     Emotional Assessment Appearance:: Appears stated age Attitude/Demeanor/Rapport: Engaged Affect (typically observed): Accepting, Appropriate, Calm Orientation: : Oriented to Self, Oriented to Place, Oriented to Situation, Oriented to  Time Alcohol / Substance Use: Not Applicable Psych Involvement: No (comment)  Admission diagnosis:  Hypoxia [R09.02] Acute respiratory failure with hypoxia and hypercapnia (HCC) [J96.01, J96.02] Altered mental status, unspecified altered mental status type [R41.82] Acute hypercapnic respiratory failure (HCC) [J96.02] Patient Active Problem List   Diagnosis Date Noted  . Acute hypercapnic respiratory failure (HCC) 10/07/2019  . Altered mental status   . Chronic systolic CHF (congestive heart failure) (HCC) 09/22/2019  . Degeneration of cervical intervertebral disc  07/05/2019  . Bilateral primary osteoarthritis of knee 04/26/2019  . Chronic bilateral thoracic back pain  12/05/2018  . Cervicalgia 12/05/2018  . 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 Acute adjustment disorder with anxiety 10/06/2017  . Palliative care by specialist   . Respiratory failure ([place]) 10/01/2017  . COPD exacerbation ([place]) 09/01/2016  . Elevated brain natriuretic peptide (BNP) level 09/01/2016  . LFT elevation 09/01/2016  . 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 Chronic obstructive pulmonary disease with acute lower respiratory infection ([place])   . Acute respiratory failure with hypoxia and hypercapnia (HCC)   . Sepsis ([place]) 09/03/2015  . CAP (community acquired pneumonia) 09/01/2015  . SIRS (systemic inflammatory response syndrome) ([place]) 07/24/2014  . COPD with acute exacerbation ([place]) 09/29/2013  . Acute on chronic respiratory failure with hypercapnia ([place]) 11/27/2012  . Insomnia 11/04/2012  . Generalized anxiety disorder 11/04/2012  . Depression 11/04/2012  . Fibromyalgia 11/04/2012  . Metabolic encephalopathy 89/37/3428  . COPD (chronic obstructive pulmonary disease) ([place]) 06/01/2011  . Smoking 05/19/2011  . Obesity 05/19/2011  . Hyperglycemia 05/19/2011   PCP:  Marinda Elk, MD Pharmacy:   [id number] - [place], [place] - [address] [place] [address] Phone: [phone] Fax: [place], [place] [address] Phone: [phone] Fax: [phone]     Social Determinants of Health (SDOH) Interventions    Readmission Risk Interventions No flowsheet data found.

## 2019-10-11 NOTE — Discharge Summary (Signed)
Physician Discharge Summary  Bethany Villa QQP:[id number] DOB: 24-Dec-1955 DOA: 10/07/2019  PCP: Marinda Elk, MD  Admit date: 10/07/2019 Discharge date: 10/11/2019  Admitted From: Home Disposition: Home with home health  Recommendations for Outpatient Follow-up:  1. Follow up with PCP in 1-2 weeks 2. 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 During this admissionPatient was deemed to be a candidate for trilogy machine for home use because of chronic hypoxia and hypercapnia. [facility] the patient and hopefully this will be delivered to her house soon.  Patient was called out for unresponsiveness. Patient's last known normal was eveningprior to presentation. Patient was found to be 88% on room air. Patient was given Narcan given that they removed a morphine patch without much improvement. They then treated a COPD exacerbation with magnesium, Solu-Medrol, epi, albuterol and Atrovent.  2/10: Patient seen and evaluated.  Endorses some significant symptomatic improvement.  On 2 L supplemental oxygen.  Not short of breath.  Able to speak in complete sentences.  2/11: Patient seen and evaluated.  Remains on 2 L baseline oxygen.  No longer short of breath.  Able to speak in complete sentences.  Mental status at baseline.  Stable for discharge.   Discharge Diagnoses:  Active Problems:   Acute hypercapnic respiratory failure (HCC)  Unresponsiveness, resolved Acute on  chronichypoxic andhypercarbic respiratory failure. --VBG pH 7.06, pCO2 >120. Likely not being compliant with home oxygen as she was found without oxygen. Also likelycontributed by her being on Buprenorphine patch. --Chest x-ray with bilateral diffuse interstitial infiltrate more prominent at right base,can be due to pulmonary vascular congestion or atypical pneumonia. Procalcitonin less than 0.1. COVID-19 neg. She was given a dose of cefepime and vancomycin in ED. -s/pLasix 40 mg dailyx2 PLAN: -No need to continue antibiotics at this time. --continue suppl O2 to maintain 90-92% --Trilogy arranged for home use --Cautionpain patchuse at home.  Discussed with patient at bedside.  She will follow up with her pain management doctor and PCP  COPD, not in exacerbation She was given Solu-Medrol and magnesium sulfate along with breathing treatment. No wheezing during my exam. -Continue home inhalers with as needed albuterolnebs  HFrEF, not in exacerbation EF of 40 to 45% according to echo done in October 2020. No lower extremity edema with few basal crackles and chest x-ray with a possible vascular congestion.Takes PO lasix as PRN at home. -s/pLasix 40 mg dailyx2 -Not on any ACE inhibitor or ARB or beta-blocker at home. PLAN: --d/c IV lasix. --Can resume PO PRN lasix at home  Neck pain/fibromyalgia. -Continue home dose of tizanidine as needed. -Continue home dose of Topamax. --Hold home Buprenorphine patch, cautioned it's use given her chronic hypoxia and hypercapnia. --Oxycodone 5 mg PRN per pt request since her pain patch had been removed.  Frequent falls at home --PT eval: rec [facility] PT  Discharge Instructions  Discharge Instructions    Diet - low sodium heart healthy   Complete by: As directed    Increase activity slowly   Complete by: As directed      Allergies as of 10/11/2019  Reactions   Gabapentin Shortness Of Breath, Rash   Mobic [meloxicam]  Anaphylaxis   Penicillins Shortness Of Breath, Swelling   Has patient had a PCN reaction causing immediate rash, facial/tongue/throat swelling, SOB or lightheadedness with hypotension: unknown Has patient had a PCN reaction causing severe rash involving mucus membranes or skin necrosis: {unknown Has patient had a PCN reaction that required hospitalization {unknown Has patient had a PCN reaction occurring within the last 10 years: no If all of the above answers are "NO", then may proceed with Cephalosporin use.   Sulfa Antibiotics Shortness Of Breath, Swelling      Medication List    TAKE these medications   acetaminophen 500 MG tablet Commonly known as: TYLENOL Take 2 tablets (1,000 mg total) by mouth every 12 (twelve) hours as needed for mild pain or moderate pain.   albuterol (2.5 MG/3ML) 0.083% nebulizer solution Commonly known as: PROVENTIL TAKE 3 ML BY NEBULIZATION Q 6 hrs prn FOR WHEEZING OR SOB Dx J44.9   Ventolin HFA 108 (90 Base) MCG/ACT inhaler Generic drug: albuterol INHALE 2 PUFFS BY MOUTH EVERY 6 HOURS AS NEEDED FOR WHEEZE   ALPRAZolam 0.5 MG tablet Commonly known as: XANAX Take 0.5 mg by mouth 2 (two) times daily as needed.   atorvastatin 20 MG tablet Commonly known as: LIPITOR TAKE 1 TABLET (20 MG TOTAL) BY MOUTH DAILY.   Buprenorphine 15 MCG/HR Ptwk Place 15 mcg onto the skin every 7 (seven) days.   busPIRone 5 MG tablet Commonly known as: BUSPAR Take 5 mg by mouth 2 (two) times daily.   feeding supplement (ENSURE ENLIVE) Liqd Take 237 mLs by mouth 2 (two) times daily between meals.   furosemide 40 MG tablet Commonly known as: LASIX Take 40 mg by mouth daily.   guaiFENesin-dextromethorphan 100-10 MG/5ML syrup Commonly known as: ROBITUSSIN DM Take 5 mLs by mouth every 4 (four) hours as needed for cough.   mupirocin ointment 2 % Commonly known as: BACTROBAN Apply 1 application topically 3 (three) times daily.   nicotine 21 mg/24hr patch Commonly  known as: NICODERM CQ - dosed in mg/24 hours Place 1 patch (21 mg total) onto the skin daily.   nystatin 100000 UNIT/ML suspension Commonly known as: MYCOSTATIN Take 5 mLs by mouth 4 (four) times daily.   ondansetron 4 MG tablet Commonly known as: ZOFRAN Take 4 mg by mouth as needed for nausea/vomiting.   phenol 1.4 % Liqd Commonly known as: CHLORASEPTIC Use as directed 1 spray in the mouth or throat as needed for throat irritation / pain.   sertraline 50 MG tablet Commonly known as: ZOLOFT Take 1 tablet (50 mg total) by mouth daily.   tiZANidine 4 MG tablet Commonly known as: Zanaflex Take 1 tablet (4 mg total) by mouth 2 (two) times daily as needed for muscle spasms.   topiramate 25 MG tablet Commonly known as: TOPAMAX Take 25 mg by mouth 2 (two) times daily.   Trelegy Ellipta 100-62.5-25 MCG/INH Aepb Generic drug: Fluticasone-Umeclidin-Vilant Inhale 1 puff into the lungs daily.            Durable Medical Equipment  (From admission, onward)         Start     Ordered   10/10/19 1229  For home use only DME Walker rolling  Once    Comments: Rollator per PT recs  Question Answer Comment  Walker: With 5 Inch Wheels   Patient needs a walker to treat with the following condition Physical deconditioning  10/10/19 1228         Follow-up Information    Home, [facility] Follow up.   Specialty: Home Health Services Why: Physical Therapy, Nurse Contact information: [address] [place] [address] [phone]        [name], MD. Go on 10/17/2019.   Specialty: Physician Assistant Why: appointment at 1:15pm Contact information: [address] [facility] [place] [address] [phone]        Vida Rigger, MD. Go on 10/23/2019.   Specialty: Pulmonary Disease Why: appointment at 10:30am Contact information: [address] [place] [address] [phone]          Allergies  Allergen  Reactions  . Gabapentin Shortness Of Breath and Rash  . Mobic [Meloxicam] Anaphylaxis  . Penicillins Shortness Of Breath and Swelling    Has patient had a PCN reaction causing immediate rash, facial/tongue/throat swelling, SOB or lightheadedness with hypotension: unknown Has patient had a PCN reaction causing severe rash involving mucus membranes or skin necrosis: {unknown Has patient had a PCN reaction that required hospitalization {unknown Has patient had a PCN reaction occurring within the last 10 years: no If all of the above answers are "NO", then may proceed with Cephalosporin use.  . Sulfa Antibiotics Shortness Of Breath and Swelling    Consultations:  None   Procedures/Studies: X-ray chest PA and lateral  Result Date: 10/08/2019 CLINICAL DATA:  COPD, hypercapnic respiratory failure EXAM: CHEST - 2 VIEW COMPARISON:  Chest radiograph from one day prior. FINDINGS: Stable cardiomediastinal silhouette with mild cardiomegaly. No pneumothorax. No pleural effusion. No overt pulmonary edema. No acute consolidative airspace disease. IMPRESSION: Stable mild cardiomegaly without overt pulmonary edema. No active pulmonary disease. Electronically Signed   By: Delbert Phenix M.D.   On: 10/08/2019 08:00   DG Chest 2 View  Result Date: 09/21/2019 CLINICAL DATA:  64 year old female with shortness of breath EXAM: CHEST - 2 VIEW COMPARISON:  Chest radiograph dated 06/10/2019 FINDINGS: There is background of emphysema and chronic interstitial coarsening. No focal consolidation, pleural effusion, pneumothorax. The cardiac silhouette is within normal limits. Atherosclerotic calcification of the aortic arch. No acute osseous pathology. IMPRESSION: No acute cardiopulmonary process. Electronically Signed   By: Elgie Collard M.D.   On: 09/21/2019 18:50   CT Head Wo Contrast  Result Date: 10/07/2019 CLINICAL DATA:  Unresponsive, now with neck pain. EXAM: CT HEAD WITHOUT CONTRAST CT CERVICAL SPINE WITHOUT  CONTRAST TECHNIQUE: Multidetector CT imaging of the head and cervical spine was performed following the standard protocol without intravenous contrast. Multiplanar CT image reconstructions of the cervical spine were also generated. COMPARISON:  10/17/2014 head CT FINDINGS: CT HEAD FINDINGS Brain: There is no evidence for acute hemorrhage, hydrocephalus, mass lesion, or abnormal extra-axial fluid collection. No definite CT evidence for acute infarction. Vascular: No hyperdense vessel or unexpected calcification. Skull: No evidence for fracture. No worrisome lytic or sclerotic lesion. Sinuses/Orbits: Air-fluid levels are seen in the sphenoid and right frontal sinus mastoid air cells are clear. Visualized portions of the globes and intraorbital fat are unremarkable. Other: None. CT CERVICAL SPINE FINDINGS Alignment: Markedly motion degraded assessment of the mid cervical spine. Within this limitation no subluxation or dislocation evident. Skull base and vertebrae: Marked motion degradation from the C1 level through C3 and again in C4 through C6. Within this limitation, no definite acute fracture. Rotation of C1 on 2 is compatible with placement of the patient's head. Soft tissues and spinal canal: No prevertebral  fluid or swelling. No visible canal hematoma. Disc levels:  Loss of disc height noted C5-6. Upper chest: Emphysema noted in the visualized portion of the upper lobes bilaterally. Other: None. IMPRESSION: 1. No acute intracranial abnormality. 2. Air-fluid levels in the sphenoid and right frontal sinuses suggest acute sinusitis. 3. Motion degraded CT assessment of the cervical spine shows no definite fracture or subluxation. If there is high clinical index of suspicion for cervical spine injury, repeat CT imaging may be warranted. 4. Degenerative disc disease at C5-6. 5. Emphysematous change noted in the visualized portion of the upper lobes. Electronically Signed   By: Kennith Center M.D.   On: 10/07/2019 15:23    CT Cervical Spine Wo Contrast  Result Date: 10/07/2019 CLINICAL DATA:  Unresponsive, now with neck pain. EXAM: CT HEAD WITHOUT CONTRAST CT CERVICAL SPINE WITHOUT CONTRAST TECHNIQUE: Multidetector CT imaging of the head and cervical spine was performed following the standard protocol without intravenous contrast. Multiplanar CT image reconstructions of the cervical spine were also generated. COMPARISON:  10/17/2014 head CT FINDINGS: CT HEAD FINDINGS Brain: There is no evidence for acute hemorrhage, hydrocephalus, mass lesion, or abnormal extra-axial fluid collection. No definite CT evidence for acute infarction. Vascular: No hyperdense vessel or unexpected calcification. Skull: No evidence for fracture. No worrisome lytic or sclerotic lesion. Sinuses/Orbits: Air-fluid levels are seen in the sphenoid and right frontal sinus mastoid air cells are clear. Visualized portions of the globes and intraorbital fat are unremarkable. Other: None. CT CERVICAL SPINE FINDINGS Alignment: Markedly motion degraded assessment of the mid cervical spine. Within this limitation no subluxation or dislocation evident. Skull base and vertebrae: Marked motion degradation from the C1 level through C3 and again in C4 through C6. Within this limitation, no definite acute fracture. Rotation of C1 on 2 is compatible with placement of the patient's head. Soft tissues and spinal canal: No prevertebral fluid or swelling. No visible canal hematoma. Disc levels:  Loss of disc height noted C5-6. Upper chest: Emphysema noted in the visualized portion of the upper lobes bilaterally. Other: None. IMPRESSION: 1. No acute intracranial abnormality. 2. Air-fluid levels in the sphenoid and right frontal sinuses suggest acute sinusitis. 3. Motion degraded CT assessment of the cervical spine shows no definite fracture or subluxation. If there is high clinical index of suspicion for cervical spine injury, repeat CT imaging may be warranted. 4. Degenerative  disc disease at C5-6. 5. Emphysematous change noted in the visualized portion of the upper lobes. Electronically Signed   By: Kennith Center M.D.   On: 10/07/2019 15:23   DG Chest Portable 1 View  Result Date: 10/07/2019 CLINICAL DATA:  Unresponsive. EXAM: PORTABLE CHEST 1 VIEW COMPARISON:  09/21/2019 FINDINGS: 3:09 p.m. The cardio pericardial silhouette is enlarged. Diffuse interstitial and alveolar opacity noted bilaterally with underlying vascular congestion. Patchy airspace disease noted at the right base. No pleural effusion. The visualized bony structures of the thorax are intact. Telemetry leads overlie the chest. IMPRESSION: 1. Cardiomegaly with vascular congestion. 2. Diffuse interstitial and patchy areas of airspace opacity, most prominently at the right base. Features could reflect asymmetric edema or multifocal pneumonia. Electronically Signed   By: Kennith Center M.D.   On: 10/07/2019 15:24    (Echo, Carotid, EGD, Colonoscopy, ERCP)    Subjective: Seen and examined on the day of discharge No acute overnight events, no acute new complaints Feels well, ready for discharge home  Discharge Exam: Vitals:   10/11/19 0818 10/11/19 1107  BP: 96/70 116/77  Pulse: 80  86  Resp: 18 18  Temp: 97.6 F (36.4 C)   SpO2: 93% 96%   Vitals:   10/10/19 2021 10/11/19 0403 10/11/19 0818 10/11/19 1107  BP: 93/72 107/75 96/70 116/77  Pulse: 78 82 80 86  Resp:  17 18 18   Temp: 98.2 F (36.8 C) 98.2 F (36.8 C) 97.6 F (36.4 C)   TempSrc: Oral Oral Oral   SpO2: 100% 94% 93% 96%  Weight:      Height:        General: Pt is alert, awake, not in acute distress Cardiovascular: RRR, S1/S2 +, no rubs, no gallops Respiratory: CTA bilaterally, no wheezing, no rhonchi Abdominal: Soft, NT, ND, bowel sounds + Extremities: no edema, no cyanosis    The results of significant diagnostics from this hospitalization (including imaging, microbiology, ancillary and laboratory) are listed below for  reference.     Microbiology: Recent Results (from the past 240 hour(s))  Culture, blood (routine x 2)     Status: None (Preliminary result)   Collection Time: 10/07/19  3:18 PM   Specimen: BLOOD  Result Value Ref Range Status   Specimen Description BLOOD BLOOD RIGHT WRIST  Final   Special Requests   Final    BOTTLES DRAWN AEROBIC AND ANAEROBIC Blood Culture results may not be optimal due to an inadequate volume of blood received in culture bottles   Culture   Final    NO GROWTH 4 DAYS Performed at [facility], [address] [place], [place]    Report Status PENDING  Incomplete  Culture, blood (routine x 2)     Status: None (Preliminary result)   Collection Time: 10/07/19  3:23 PM   Specimen: BLOOD  Result Value Ref Range Status   Specimen Description BLOOD BLOOD RIGHT HAND  Final   Special Requests   Final    BOTTLES DRAWN AEROBIC AND ANAEROBIC Blood Culture results may not be optimal due to an inadequate volume of blood received in culture bottles   Culture   Final    NO GROWTH 4 DAYS Performed at [facility], [address], [place], [place]    Report Status PENDING  Incomplete  Respiratory Panel by RT PCR (Flu A&B, Covid) - Nasopharyngeal Swab     Status: None   Collection Time: 10/07/19  3:23 PM   Specimen: Nasopharyngeal Swab  Result Value Ref Range Status   SARS Coronavirus 2 by RT PCR NEGATIVE NEGATIVE Final    Comment: (NOTE) SARS-CoV-2 target nucleic acids are NOT DETECTED. The SARS-CoV-2 RNA is generally detectable in upper respiratoy specimens during the acute phase of infection. The lowest concentration of SARS-CoV-2 viral copies this assay can detect is 131 copies/mL. A negative result does not preclude SARS-Cov-2 infection and should not be used as the sole basis for treatment or other patient management decisions. A negative result may occur with  improper specimen collection/handling, submission of specimen  other than nasopharyngeal swab, presence of viral mutation(s) within the areas targeted by this assay, and inadequate number of viral copies (<131 copies/mL). A negative result must be combined with clinical observations, patient history, and epidemiological information. The expected result is Negative. Fact Sheet for Patients:  12/05/19 Fact Sheet for Healthcare Providers:  https://www.moore.com/ This test is not yet ap proved or cleared by the https://www.young.biz/ FDA and  has been authorized for detection and/or diagnosis of SARS-CoV-2 by FDA under an Emergency Use Authorization (EUA). This EUA will remain  in effect (meaning  this test can be used) for the duration of the COVID-19 declaration under Section 564(b)(1) of the Act, 21 U.S.C. section 360bbb-3(b)(1), unless the authorization is terminated or revoked sooner.    Influenza A by PCR NEGATIVE NEGATIVE Final   Influenza B by PCR NEGATIVE NEGATIVE Final    Comment: (NOTE) The Xpert Xpress SARS-CoV-2/FLU/RSV assay is intended as an aid in  the diagnosis of influenza from Nasopharyngeal swab specimens and  should not be used as a sole basis for treatment. Nasal washings and  aspirates are unacceptable for Xpert Xpress SARS-CoV-2/FLU/RSV  testing. Fact Sheet for Patients: https://www.moore.com/ Fact Sheet for Healthcare Providers: https://www.young.biz/ This test is not yet approved or cleared by the [place] FDA and  has been authorized for detection and/or diagnosis of SARS-CoV-2 by  FDA under an Emergency Use Authorization (EUA). This EUA will remain  in effect (meaning this test can be used) for the duration of the  Covid-19 declaration under Section 564(b)(1) of the Act, 21  U.S.C. section 360bbb-3(b)(1), unless the authorization is  terminated or revoked. Performed at [facility], [address] [place], [place]  [address]      Labs: BNP (last 3 results) Recent Labs    06/10/19 2331 09/21/19 1808 10/07/19 1753  BNP 357.0* 47.0 370.0*   Basic Metabolic Panel: Recent Labs  Lab 10/07/19 1441 10/08/19 0605 10/10/19 0702 10/11/19 0425  NA 145 137 140 140  K 5.3* 3.6 2.8* 4.0  CL 98 91* 96* 102  CO2 40* 38* 33* 29  GLUCOSE 130* 125* 137* 107*  BUN 17 19 19 20   CREATININE 0.61 0.67 0.56 0.63  CALCIUM 8.9 8.6* 8.9 8.9  MG  --   --  2.0 2.0   Liver Function Tests: Recent Labs  Lab 10/07/19 1441  AST 29  ALT 29  ALKPHOS 91  BILITOT 0.9  PROT 7.3  ALBUMIN 3.4*   No results for input(s): LIPASE, AMYLASE in the last 168 hours. No results for input(s): AMMONIA in the last 168 hours. CBC: Recent Labs  Lab 10/07/19 1441 10/08/19 0605 10/10/19 0702 10/11/19 0425  WBC 13.6* 5.8 6.7 8.1  NEUTROABS 10.7*  --   --   --   HGB 14.9 13.4 15.5* 14.6  HCT 52.7* 44.2 48.3* 46.0  MCV 103.5* 98.2 92.2 91.5  PLT 114* 84* 94* 102*   Cardiac Enzymes: No results for input(s): CKTOTAL, CKMB, CKMBINDEX, TROPONINI in the last 168 hours. BNP: Invalid input(s): POCBNP CBG: No results for input(s): GLUCAP in the last 168 hours. D-Dimer No results for input(s): DDIMER in the last 72 hours. Hgb A1c No results for input(s): HGBA1C in the last 72 hours. Lipid Profile No results for input(s): CHOL, HDL, LDLCALC, TRIG, CHOLHDL, LDLDIRECT in the last 72 hours. Thyroid function studies No results for input(s): TSH, T4TOTAL, T3FREE, THYROIDAB in the last 72 hours.  Invalid input(s): FREET3 Anemia work up No results for input(s): VITAMINB12, FOLATE, FERRITIN, TIBC, IRON, RETICCTPCT in the last 72 hours. Urinalysis    Component Value Date/Time   COLORURINE YELLOW (A) 10/07/2019 1441   APPEARANCEUR CLEAR (A) 10/07/2019 1441   APPEARANCEUR Hazy 08/22/2013 1111   LABSPEC 1.020 10/07/2019 1441   LABSPEC 1.016 08/22/2013 1111   PHURINE 5.0 10/07/2019 1441   GLUCOSEU NEGATIVE 10/07/2019 1441    GLUCOSEU Negative 08/22/2013 1111   HGBUR NEGATIVE 10/07/2019 1441   BILIRUBINUR NEGATIVE 10/07/2019 1441   BILIRUBINUR Negative 08/22/2013 1111   KETONESUR NEGATIVE 10/07/2019 1441   PROTEINUR 100 (  A) 10/07/2019 1441   UROBILINOGEN 0.2 07/24/2014 0635   NITRITE NEGATIVE 10/07/2019 1441   LEUKOCYTESUR NEGATIVE 10/07/2019 1441   LEUKOCYTESUR 1+ 08/22/2013 1111   Sepsis Labs Invalid input(s): PROCALCITONIN,  WBC,  LACTICIDVEN Microbiology Recent Results (from the past 240 hour(s))  Culture, blood (routine x 2)     Status: None (Preliminary result)   Collection Time: 10/07/19  3:18 PM   Specimen: BLOOD  Result Value Ref Range Status   Specimen Description BLOOD BLOOD RIGHT WRIST  Final   Special Requests   Final    BOTTLES DRAWN AEROBIC AND ANAEROBIC Blood Culture results may not be optimal due to an inadequate volume of blood received in culture bottles   Culture   Final    NO GROWTH 4 DAYS Performed at [facility], [address] [place], [place] [address]    Report Status PENDING  Incomplete  Culture, blood (routine x 2)     Status: None (Preliminary result)   Collection Time: 10/07/19  3:23 PM   Specimen: BLOOD  Result Value Ref Range Status   Specimen Description BLOOD BLOOD RIGHT HAND  Final   Special Requests   Final    BOTTLES DRAWN AEROBIC AND ANAEROBIC Blood Culture results may not be optimal due to an inadequate volume of blood received in culture bottles   Culture   Final    NO GROWTH 4 DAYS Performed at [facility], [address] [place], [place] [address]    Report Status PENDING  Incomplete  Respiratory Panel by RT PCR (Flu A&B, Covid) - Nasopharyngeal Swab     Status: None   Collection Time: 10/07/19  3:23 PM   Specimen: Nasopharyngeal Swab  Result Value Ref Range Status   SARS Coronavirus 2 by RT PCR NEGATIVE NEGATIVE Final    Comment: (NOTE) SARS-CoV-2 target nucleic acids are NOT DETECTED. The SARS-CoV-2 RNA is generally  detectable in upper respiratoy specimens during the acute phase of infection. The lowest concentration of SARS-CoV-2 viral copies this assay can detect is 131 copies/mL. A negative result does not preclude SARS-Cov-2 infection and should not be used as the sole basis for treatment or other patient management decisions. A negative result may occur with  improper specimen collection/handling, submission of specimen other than nasopharyngeal swab, presence of viral mutation(s) within the areas targeted by this assay, and inadequate number of viral copies (<131 copies/mL). A negative result must be combined with clinical observations, patient history, and epidemiological information. The expected result is Negative. Fact Sheet for Patients:  https://www.moore.com/https://www.fda.gov/media/142436/download Fact Sheet for Healthcare Providers:  https://www.young.biz/https://www.fda.gov/media/142435/download This test is not yet ap proved or cleared by the [place] FDA and  has been authorized for detection and/or diagnosis of SARS-CoV-2 by FDA under an Emergency Use Authorization (EUA). This EUA will remain  in effect (meaning this test can be used) for the duration of the COVID-19 declaration under Section 564(b)(1) of the Act, 21 U.S.C. section 360bbb-3(b)(1), unless the authorization is terminated or revoked sooner.    Influenza A by PCR NEGATIVE NEGATIVE Final   Influenza B by PCR NEGATIVE NEGATIVE Final    Comment: (NOTE) The Xpert Xpress SARS-CoV-2/FLU/RSV assay is intended as an aid in  the diagnosis of influenza from Nasopharyngeal swab specimens and  should not be used as a sole basis for treatment. Nasal washings and  aspirates are unacceptable for Xpert Xpress SARS-CoV-2/FLU/RSV  testing. Fact Sheet for Patients: https://www.moore.com/https://www.fda.gov/media/142436/download Fact Sheet for Healthcare Providers: https://www.young.biz/https://www.fda.gov/media/142435/download This test is not  yet approved or cleared by the [place] FDA and  has been  authorized for detection and/or diagnosis of SARS-CoV-2 by  FDA under an Emergency Use Authorization (EUA). This EUA will remain  in effect (meaning this test can be used) for the duration of the  Covid-19 declaration under Section 564(b)(1) of the Act, 21  U.S.C. section 360bbb-3(b)(1), unless the authorization is  terminated or revoked. Performed at [facility], [address] [place], [place] [address]      Time coordinating discharge: Over 30 minutes  SIGNED:   Tresa MooreSudheer B Anamika Kueker, MD  [facility] 10/11/2019, 1:21 PM Pager   If 7PM-7AM, please contact night-coverage

## 2019-10-12 LAB — CULTURE, BLOOD (ROUTINE X 2)
Culture: NO GROWTH
Culture: NO GROWTH

## 2019-10-29 LAB — BLOOD GAS, VENOUS
FIO2: 0.32
Patient temperature: 37
pCO2, Ven: >120 mmHg (ref 44.0–60.0)
pH, Ven: 7.06 — CL (ref 7.250–7.430)
pO2, Ven: 60 mmHg — ABNORMAL HIGH (ref 32.0–45.0)

## 2019-11-14 ENCOUNTER — Telehealth: Payer: Self-pay | Admitting: *Deleted

## 2019-11-14 NOTE — Telephone Encounter (Signed)
Last prescribed Tizanidine 08-21-19, with 2 RF. Instructions 2 times per day. Some days takes up to 4/day,but some days none at all.  Will take these, but also asking if there are any that may work better.

## 2019-11-19 MED ORDER — TIZANIDINE HCL 4 MG PO TABS: 4.0000 mg | ORAL_TABLET | Freq: Two times a day (BID) | ORAL | 2 refills | Status: DC | PRN

## 2019-11-19 NOTE — Telephone Encounter (Signed)
Attempted to call patient, mail box is full, unable to leave a message.

## 2019-12-20 ENCOUNTER — Other Ambulatory Visit: Payer: Self-pay

## 2019-12-20 ENCOUNTER — Ambulatory Visit
Payer: Medicare Other | Attending: Student in an Organized Health Care Education/Training Program | Admitting: Student in an Organized Health Care Education/Training Program

## 2019-12-20 ENCOUNTER — Encounter: Payer: Self-pay | Admitting: Student in an Organized Health Care Education/Training Program

## 2019-12-20 DIAGNOSIS — M797 Fibromyalgia: Secondary | ICD-10-CM

## 2019-12-20 DIAGNOSIS — M25562 Pain in left knee: Secondary | ICD-10-CM

## 2019-12-20 DIAGNOSIS — M5030 Other cervical disc degeneration, unspecified cervical region: Secondary | ICD-10-CM

## 2019-12-20 DIAGNOSIS — M542 Cervicalgia: Secondary | ICD-10-CM

## 2019-12-20 DIAGNOSIS — M546 Pain in thoracic spine: Secondary | ICD-10-CM

## 2019-12-20 DIAGNOSIS — Z96652 Presence of left artificial knee joint: Secondary | ICD-10-CM

## 2019-12-20 DIAGNOSIS — G8929 Other chronic pain: Secondary | ICD-10-CM

## 2019-12-20 DIAGNOSIS — M170 Bilateral primary osteoarthritis of knee: Secondary | ICD-10-CM | POA: Diagnosis not present

## 2019-12-20 DIAGNOSIS — G894 Chronic pain syndrome: Secondary | ICD-10-CM

## 2019-12-20 DIAGNOSIS — R110 Nausea: Secondary | ICD-10-CM

## 2019-12-20 MED ORDER — BUPRENORPHINE 20 MCG/HR TD PTWK: 1.0000 | MEDICATED_PATCH | TRANSDERMAL | 2 refills | Status: AC

## 2019-12-20 MED ORDER — ONDANSETRON 8 MG PO TBDP: 8.0000 mg | ORAL_TABLET | Freq: Three times a day (TID) | ORAL | 0 refills | Status: AC | PRN

## 2019-12-20 NOTE — Progress Notes (Signed)
Patient: Bethany Villa  Service Category: E/M  Provider: Gillis Santa, MD  DOB: [birth year]  DOS: 12/20/2019  Location: Office  MRN: [id number]  Setting: Ambulatory outpatient  Referring Provider: Marinda Elk, MD  Type: Established Patient  Specialty: Interventional Pain Management  PCP: Marinda Elk, MD  Location: Home  Delivery: TeleHealth     Virtual Encounter - Pain Management PROVIDER NOTE: Information contained herein reflects review and annotations entered in association with encounter. Interpretation of such information and data should be left to medically-trained personnel. Information provided to patient can be located elsewhere in the medical record under "Patient Instructions". Document created using STT-dictation technology, any transcriptional errors that may result from process are unintentional.    Contact & Pharmacy Preferred: [phone] Home: [phone] (home) Mobile: [phone] (mobile) E-mail: [phone] [email or web address]  [id number] [place] [id number]: [phone]: [address]Phone: [phone]: [phone]  Pre-screening  [name] "in-person" vs "virtual" encounter. She indicated preferring virtual for this encounter.   Reason COVID-19*  Social distancing based on CDC and AMA recommendations.   I contacted Bethany Bostonon 12/20/2019 via telephone.      I clearly identified myself as BGillis Santa MD. I verified that I was speaking with the correct person using two identifiers (Name: PMALAINA MORTELLARO and date of birth: 63-Apr-1957.  This visit was completed via telephone due to the restrictions of the COVID-19 pandemic. All issues as above were discussed and addressed but no physical exam was performed. If it was felt that the patient should be evaluated in the  office, they were directed there. The patient verbally consented to this visit. Patient was unable to complete an audio/visual visit due to Technical difficulties and/or Lack of internet. Due to the catastrophic nature of the COVID-19 pandemic, this visit was done through audio contact only.  Location of the patient: home address (see Epic for details)  Location of the provider: office  Consent I sought verbal advanced consent from Bethany Bostonfor virtual visit interactions. I informed Ms. MSabra Heckof possible security and privacy concerns, risks, and limitations associated with providing "not-in-person" medical evaluation and management services. I also informed Ms. MSabra Heckof the availability of "in-person" appointments. Finally, I informed her that there would be a charge for the virtual visit and that she could be  personally, fully or partially, financially responsible for it. Ms. MDysertexpressed understanding and agreed to proceed.   Historic Elements   Ms. PAMBERLEA SPAGNUOLOis a 64y.o. year old, female patient evaluated today after her last contact with our practice on 11/14/2019. Bethany Villa has a past medical history of Anxiety, Asthma, Chronic pain, Chronic systolic CHF (congestive heart failure) ([place] ([place]), COPD (chronic obstructive pulmonary disease) ([place], Depression, DJD (degenerative joint disease), Fibromyalgia, GERD (gastroesophageal reflux disease), Headache, Hyperlipidemia, Lower extremity edema, On home oxygen therapy, Panic attack, Shortness of breath dyspnea, and Sleep disorder. 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 Ms. MBarkeyhas a current medication list which includes the following prescription(s): acetaminophen, albuterol, albuterol, alprazolam, atorvastatin, buspirone, feeding  supplement (ensure enlive), trelegy ellipta, furosemide, guaifenesin-dextromethorphan, mupirocin ointment, ondansetron, phenol, sertraline, tizanidine, topiramate, buprenorphine, nystatin, and ondansetron. She  reports that she has been smoking cigarettes. She has a 45.00 pack-year smoking history. She has never used smokeless tobacco. She reports current alcohol use. She reports that she does not use drugs. Bethany Villa is allergic to gabapentin; mobic [meloxicam]; penicillins; and sulfa antibiotics.   HPI  Today, she is being contacted for medication management.  Patient overall is doing fairly well on her current regimen.  She states that the [facility] patch is helpful but she notices dose failure by day 5 amd day 6.  She states that she has increased pain towards the latter days of her patch that makes it harder for her to perform ADLs.  She does get nauseated as well when she has severe pain.  We discussed increasing patch to 20 mcg an hour to optimize pain management but I did inform the patient how this is the highest dose possible and that beyond this, do not have many other options.  Patient endorsed understanding.  Pharmacotherapy Assessment  Analgesic: 11/29/2019  2   10/01/2019  Buprenorphine 15 Mcg/Hr Patch  4.00  28 Bi Lat   [id number]   Gib (4800)   2  0.36 mg  Medicare   [place]     Monitoring: [place] PMP: PDMP reviewed during this encounter.       Pharmacotherapy: No side-effects or adverse reactions reported. Compliance: No problems identified. Effectiveness: Clinically acceptable. Plan: Refer to "POC".  UDS:  Summary  Date Value Ref Range Status  05/16/2018 FINAL  Final    Comment:    ==================================================================== TOXASSURE COMP DRUG ANALYSIS,UR ==================================================================== Test                             Result       Flag       Units Drug Present and Declared for Prescription Verification   Sertraline                      PRESENT      EXPECTED   Desmethylsertraline            PRESENT      EXPECTED    Desmethylsertraline is an expected metabolite of sertraline.   Trazodone                      PRESENT      EXPECTED   1,3 chlorophenyl piperazine    PRESENT      EXPECTED    1,3-chlorophenyl piperazine is an expected metabolite of    trazodone. Drug Present not Declared for Prescription Verification   Buprenorphine                  11           UNEXPECTED ng/mg creat   Norbuprenorphine               45           UNEXPECTED ng/mg creat    Source of buprenorphine is a scheduled prescription medication.    Norbuprenorphine is an expected metabolite of buprenorphine.   Methocarbamol                  PRESENT      UNEXPECTED Drug Absent but Declared for Prescription Verification   Tizanidine                     Not Detected UNEXPECTED    Tizanidine, as  indicated in the declared medication list, is not    always detected even when used as directed. ==================================================================== Test                      Result    Flag   Units      Ref Range   Creatinine              126              mg/dL      >=20 ==================================================================== Declared Medications:  The flagging and interpretation on this report are based on the  following declared medications.  Unexpected results may arise from  inaccuracies in the declared medications.  **Note: The testing scope of this panel includes these medications:  Sertraline (Zoloft)  Trazodone  **Note: The testing scope of this panel does not include small to  moderate amounts of these reported medications:  Tizanidine  **Note: The testing scope of this panel does not include following  reported medications:  Albuterol (Proventil)  Atorvastatin (Lipitor)  Buspirone (BuSpar) ==================================================================== For clinical consultation, please call [phone]. ====================================================================    Laboratory Chemistry Profile   Renal Lab Results  Component Value Date   BUN 20 10/11/2019   CREATININE 0.63 10/11/2019   BCR 16 08/01/2015   GFRAA >60 10/11/2019   GFRNONAA >60 10/11/2019     Hepatic Lab Results  Component Value Date   AST 29 10/07/2019   ALT 29 10/07/2019   ALBUMIN 3.4 (L) 10/07/2019   ALKPHOS 91 10/07/2019   AMYLASE 39 10/01/2017   LIPASE 18 10/01/2017     Electrolytes Lab Results  Component Value Date   NA 140 10/11/2019   K 4.0 10/11/2019   CL 102 10/11/2019   CALCIUM 8.9 10/11/2019   MG 2.0 10/11/2019   PHOS 5.1 (H) 09/04/2015     Bone No results found for: VD25OH, [id number], [id number], [id number], 25OHVITD1, 25OHVITD2, 25OHVITD3, TESTOFREE, TESTOSTERONE   Inflammation (CRP: Acute Phase) (ESR: Chronic Phase) Lab Results  Component Value Date   LATICACIDVEN 1.1 10/07/2019       Note: Above Lab results reviewed.  Imaging  X-ray chest PA and lateral CLINICAL DATA:  COPD, hypercapnic respiratory failure  EXAM: CHEST - 2 VIEW  COMPARISON:  Chest radiograph from one day prior.  FINDINGS: Stable cardiomediastinal silhouette with mild cardiomegaly. No pneumothorax. No pleural effusion. No overt pulmonary edema. No acute consolidative airspace disease.  IMPRESSION: Stable mild cardiomegaly without overt pulmonary edema. No active pulmonary disease.  Electronically Signed   By: Ilona Sorrel M.D.   On: 10/08/2019 08:00  Assessment  The primary encounter diagnosis was Cervicalgia. Diagnoses of Degeneration of cervical intervertebral disc, Fibromyalgia, Bilateral primary osteoarthritis of knee, Chronic bilateral thoracic back pain, Chronic knee pain after total replacement of left knee joint, Chronic pain disorder, Chronic pain syndrome, and Nausea without vomiting were also pertinent to this visit.  Plan of Care   Ms. DAISHIA FETTERLY has a current  medication list which includes the following long-term medication(s): albuterol, albuterol, atorvastatin, and sertraline.  Pharmacotherapy (Medications Ordered): Meds ordered this encounter  Medications  . buprenorphine (BUTRANS) 20 MCG/HR PTWK patch    Sig: Place 1 patch onto the skin every 7 (seven) days.    Dispense:  4 patch    Refill:  2    Do not place this medication, or any other prescription from our practice, on "Automatic Refill". Patient may have prescription filled one day early  if pharmacy is closed on scheduled refill date.  . ondansetron (ZOFRAN-ODT) 8 MG disintegrating tablet    Sig: Take 1 tablet (8 mg total) by mouth every 8 (eight) hours as needed for nausea or vomiting.    Dispense:  90 tablet    Refill:  0    Do not add to the electronic "Automatic Refill" notification system. Patient may have prescription filled one day early if pharmacy is closed on scheduled refill date.   Follow-up plan:   Return in about 3 months (around 03/20/2020) for Medication Management, in person.     Recent Visits Date Type Provider Dept  10/01/19 Office Visit Gillis Santa, MD [facility]  Showing recent visits within past 90 days and meeting all other requirements   Today's Visits Date Type Provider Dept  12/20/19 Telemedicine Gillis Santa, MD [facility]  Showing today's visits and meeting all other requirements   Future Appointments No visits were found meeting these conditions.  Showing future appointments within next 90 days and meeting all other requirements   I discussed the assessment and treatment plan with the patient. The patient was provided an opportunity to ask questions and all were answered. The patient agreed with the plan and demonstrated an understanding of the instructions.  Patient advised to call back or seek an in-person evaluation if the symptoms or condition worsens.  Duration of encounter: 25 minutes.  Note by: Gillis Santa,  MD Date: 12/20/2019; Time: 9:16 AM

## 2019-12-25 ENCOUNTER — Telehealth: Payer: Self-pay | Admitting: *Deleted

## 2019-12-25 NOTE — Telephone Encounter (Signed)
Chart review and Xanax not ordered by Dr Cherylann Ratel. Called patient, no answer. Left message that she would need to talk to her PCP for the medication she is requesting.

## 2020-01-29 ENCOUNTER — Telehealth: Payer: Self-pay

## 2020-01-29 ENCOUNTER — Other Ambulatory Visit: Payer: Self-pay | Admitting: *Deleted

## 2020-01-29 MED ORDER — TIZANIDINE HCL 4 MG PO TABS: 4.0000 mg | ORAL_TABLET | Freq: Two times a day (BID) | ORAL | 2 refills | Status: DC | PRN

## 2020-01-29 NOTE — Telephone Encounter (Signed)
Sent to Dr Lateef 

## 2020-01-29 NOTE — Telephone Encounter (Signed)
She is out of methocarbamol and wants to know if it can be called out.

## 2020-02-07 ENCOUNTER — Telehealth: Payer: Self-pay | Admitting: *Deleted

## 2020-02-07 ENCOUNTER — Telehealth: Payer: Self-pay | Admitting: Student in an Organized Health Care Education/Training Program

## 2020-02-07 NOTE — Telephone Encounter (Signed)
Patient is out of methacarbamol, she states the tizanidine worked better and she would like to go back on it.

## 2020-02-07 NOTE — Telephone Encounter (Signed)
Already has Zanaflex prescription. Called and informed.

## 2020-02-07 NOTE — Telephone Encounter (Signed)
Last prescribed 12-20-19, with 2 RF. Should last until 03-18-20. Called patient, voicemail left informing her of this.

## 2020-02-09 ENCOUNTER — Other Ambulatory Visit: Payer: Self-pay | Admitting: Student in an Organized Health Care Education/Training Program

## 2020-02-12 ENCOUNTER — Telehealth: Payer: Self-pay | Admitting: Student in an Organized Health Care Education/Training Program

## 2020-02-12 NOTE — Telephone Encounter (Signed)
Patient is requesting refill on Tizanidine, she has med refill app on 03-18-20

## 2020-02-12 NOTE — Telephone Encounter (Signed)
I called patient, she is actually asking for Xanax for sleep. I told her I do not see that you have prescribed that for her, and it is not a medication we usually prescribe at this office. She insists you have prescribed it before.  She asked me to ask you for something to help her sleep.

## 2020-02-13 NOTE — Telephone Encounter (Signed)
Patient notified per voicemail. 

## 2020-03-18 ENCOUNTER — Other Ambulatory Visit: Payer: Self-pay

## 2020-03-18 ENCOUNTER — Ambulatory Visit
Payer: Medicare Other | Attending: Student in an Organized Health Care Education/Training Program | Admitting: Student in an Organized Health Care Education/Training Program

## 2020-03-18 ENCOUNTER — Encounter: Payer: Self-pay | Admitting: Student in an Organized Health Care Education/Training Program

## 2020-03-18 DIAGNOSIS — M25562 Pain in left knee: Secondary | ICD-10-CM

## 2020-03-18 DIAGNOSIS — M542 Cervicalgia: Secondary | ICD-10-CM | POA: Diagnosis not present

## 2020-03-18 DIAGNOSIS — M25551 Pain in right hip: Secondary | ICD-10-CM

## 2020-03-18 DIAGNOSIS — M5030 Other cervical disc degeneration, unspecified cervical region: Secondary | ICD-10-CM | POA: Diagnosis not present

## 2020-03-18 DIAGNOSIS — M797 Fibromyalgia: Secondary | ICD-10-CM | POA: Diagnosis not present

## 2020-03-18 DIAGNOSIS — G894 Chronic pain syndrome: Secondary | ICD-10-CM

## 2020-03-18 DIAGNOSIS — Z96652 Presence of left artificial knee joint: Secondary | ICD-10-CM

## 2020-03-18 DIAGNOSIS — G8929 Other chronic pain: Secondary | ICD-10-CM

## 2020-03-18 DIAGNOSIS — M170 Bilateral primary osteoarthritis of knee: Secondary | ICD-10-CM

## 2020-03-18 MED ORDER — BUPRENORPHINE 20 MCG/HR TD PTWK: 1.0000 | MEDICATED_PATCH | TRANSDERMAL | 2 refills | Status: DC

## 2020-03-18 NOTE — Addendum Note (Signed)
Addended by: Edward Jolly on: 03/18/2020 01:24 PM   Modules accepted: Orders

## 2020-03-18 NOTE — Progress Notes (Addendum)
Patient: Bethany Villa  Service Category: E/M  Provider: Gillis Santa, MD  DOB: 22-Feb-1956  DOS: 03/18/2020  Location: Office  MRN: [id number]  Setting: Ambulatory outpatient  Referring Provider: Marinda Elk, MD  Type: Established Patient  Specialty: Interventional Pain Management  PCP: Marinda Elk, MD  Location: Home  Delivery: TeleHealth     Virtual Encounter - Pain Management PROVIDER NOTE: Information contained herein reflects review and annotations entered in association with encounter. Interpretation of such information and data should be left to medically-trained personnel. Information provided to patient can be located elsewhere in the medical record under "Patient Instructions". Document created using STT-dictation technology, any transcriptional errors that may result from process are unintentional.    Contact & Pharmacy Preferred: [phone] Home: [phone] (home) Mobile: [phone] (mobile) E-mail: [phone] [email or web address]  [place], [place] - [address] [place] [address] Phone: [phone] Fax: [phone]  [id number] [place], [place] [address] [id number]: [phone]: [phone]  Pre-screening  Ms. Bethany Heckoffered "in-person" vs "virtual" encounter. She indicated preferring virtual for this encounter.   Reason COVID-19*  Social distancing based on CDC and AMA recommendations.   I contacted PLoma Bostonon 03/18/2020 via video conference.      I clearly identified myself as BGillis Santa MD. I verified that I was speaking with the correct person using two identifiers (Name: Bethany Villa and date of birth: 1May 18, 1957.  Consent I sought verbal advanced consent from PLoma Bostonfor virtual visit interactions. I informed Ms. Bethany Heckof possible security and privacy concerns, risks, and limitations associated with providing "not-in-person"  medical evaluation and management services. I also informed Ms. Bethany Heckof the availability of "in-person" appointments. Finally, I informed her that there would be a charge for the virtual visit and that she could be  personally, fully or partially, financially responsible for it. [name] understanding and agreed to proceed.   Historic Elements   Ms. Bethany PERINEis a 64y.o. year old, female patient evaluated today after her last contact with our practice on 02/12/2020. [name] a past medical history of Anxiety, Asthma, Chronic pain, Chronic systolic CHF (congestive heart failure) [place]), COPD ([place]ronic obstructive pulmonary disease) ([place], Depression, DJD (degenerative joint disease), Fibromyalgia, GERD (gastroesophageal reflux disease), Headache, Hyperlipidemia, Lower extremity edema, On home oxygen therapy, Panic attack, Shortness of breath dyspnea, and Sleep disorder. 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 Ms. MKalbfleischhas a current medication list which includes the following prescription(s): acetaminophen, albuterol, albuterol, alprazolam, atorvastatin, [START ON 03/25/2020] buprenorphine, buspirone, feeding supplement (ensure enlive), trelegy ellipta, furosemide, guaifenesin-dextromethorphan, mupirocin ointment, nystatin, ondansetron, phenol, sertraline, tizanidine, and topiramate. She  reports that she Villa been smoking cigarettes. She Villa a 45.00 pack-year smoking history. She Villa never used smokeless tobacco. She reports current alcohol use. She reports that she does not use drugs. Ms. MNotteis allergic to gabapentin, mobic [meloxicam], penicillins, and sulfa antibiotics.   HPI  Today, she is being contacted for medication management.   Viral gastro, started Saturday. Is getting  better she states. Is utilizing Zofran and trying to advance diet as tolerated. Had a small piece of toast.  Refill of Butrans patch as below at 20 mcg.   Pharmacotherapy Assessment  Analgesic: 02/25/2020  2   12/20/2019  Buprenorphine 20 Mcg/Hr Patch  4.00  28 Bi Lat   [id number]   Gib (4800)   2/2  0.48 mg  Medicare   [place]   Monitoring: [place] PMP: PDMP reviewed during this encounter.       Pharmacotherapy: No side-effects or adverse reactions reported. Compliance: No problems identified. Effectiveness: Clinically acceptable. Plan: Refer to "POC".  UDS:  Summary  Date Value Ref Range Status  05/16/2018 FINAL  Final    Comment:    ==================================================================== TOXASSURE COMP DRUG ANALYSIS,UR ==================================================================== Test                             Result       Flag       Units Drug Present and Declared for Prescription Verification   Sertraline                     PRESENT      EXPECTED   Desmethylsertraline            PRESENT      EXPECTED    Desmethylsertraline is an expected metabolite of sertraline.   Trazodone                      PRESENT      EXPECTED   1,3 chlorophenyl piperazine    PRESENT      EXPECTED    1,3-chlorophenyl piperazine is an expected metabolite of    trazodone. Drug Present not Declared for Prescription Verification   Buprenorphine                  11           UNEXPECTED ng/mg creat   Norbuprenorphine               45           UNEXPECTED ng/mg creat    Source of buprenorphine is a scheduled prescription medication.    Norbuprenorphine is an expected metabolite of buprenorphine.   Methocarbamol                  PRESENT      UNEXPECTED Drug Absent but Declared for Prescription Verification   Tizanidine                     Not Detected UNEXPECTED    Tizanidine, as indicated in the declared medication list, is not    always detected even when used as  directed. ==================================================================== Test                      Result    Flag   Units      Ref Range   Creatinine              126              mg/dL      >=20 ==================================================================== Declared Medications:  The flagging and interpretation on this report are based on the  following declared medications.  Unexpected results may arise from  inaccuracies in the declared medications.  **Note: The testing scope of this panel includes these medications:  Sertraline (Zoloft)  Trazodone  **Note: The testing scope of this panel does not include small to  moderate amounts of these reported medications:  Tizanidine  **Note: The testing scope of this panel does  not include following  reported medications:  Albuterol (Proventil)  Atorvastatin (Lipitor)  Buspirone (BuSpar) ==================================================================== For clinical consultation, please call [phone]. ====================================================================     Laboratory Chemistry Profile   Renal Lab Results  Component Value Date   BUN 20 10/11/2019   CREATININE 0.63 10/11/2019   BCR 16 08/01/2015   GFRAA >60 10/11/2019   GFRNONAA >60 10/11/2019     Hepatic Lab Results  Component Value Date   AST 29 10/07/2019   ALT 29 10/07/2019   ALBUMIN 3.4 (L) 10/07/2019   ALKPHOS 91 10/07/2019   AMYLASE 39 10/01/2017   LIPASE 18 10/01/2017     Electrolytes Lab Results  Component Value Date   NA 140 10/11/2019   K 4.0 10/11/2019   CL 102 10/11/2019   CALCIUM 8.9 10/11/2019   MG 2.0 10/11/2019   PHOS 5.1 (H) 09/04/2015     Bone No results found for: VD25OH, [id number], GB1517OH6, WV3710GY6, 25OHVITD1, 25OHVITD2, 25OHVITD3, TESTOFREE, TESTOSTERONE   Inflammation (CRP: Acute Phase) (ESR: Chronic Phase) Lab Results  Component Value Date   LATICACIDVEN 1.1 10/07/2019       Note: Above Lab  results reviewed.   Imaging  X-ray chest PA and lateral CLINICAL DATA:  COPD, hypercapnic respiratory failure  EXAM: CHEST - 2 VIEW  COMPARISON:  Chest radiograph from one day prior.  FINDINGS: Stable cardiomediastinal silhouette with mild cardiomegaly. No pneumothorax. No pleural effusion. No overt pulmonary edema. No acute consolidative airspace disease.  IMPRESSION: Stable mild cardiomegaly without overt pulmonary edema. No active pulmonary disease.  Electronically Signed   By: Ilona Sorrel M.D.   On: 10/08/2019 08:00  Assessment  The primary encounter diagnosis was Chronic pain disorder. Diagnoses of Fibromyalgia, Cervicalgia, Degeneration of cervical intervertebral disc, Bilateral primary osteoarthritis of knee, Chronic knee pain after total replacement of left knee joint, Right hip pain, and Chronic pain syndrome were also pertinent to this visit.  Plan of Care   Ms. TRENIECE HOLSCLAW Villa a current medication list which includes the following long-term medication(s): albuterol, albuterol, atorvastatin, and sertraline.  Pharmacotherapy (Medications Ordered): Meds ordered this encounter  Medications  . DISCONTD: buprenorphine (BUTRANS) 20 MCG/HR PTWK    Sig: Place 1 patch onto the skin every 7 (seven) days.    Dispense:  4 patch    Refill:  2    For chronic pain syndrome  . buprenorphine (BUTRANS) 20 MCG/HR PTWK    Sig: Place 1 patch onto the skin every 7 (seven) days.    Dispense:  4 patch    Refill:  2    For chronic pain syndrome    Follow-up plan:   Return in about 3 months (around 06/18/2020) for Medication Management, in person, (UDS).   Recent Visits Date Type Provider Dept  12/20/19 Telemedicine Gillis Santa, MD [facility]  Showing recent visits within past 90 days and meeting all other requirements Today's Visits Date Type Provider Dept  03/18/20 Telemedicine Gillis Santa, MD [facility]  Showing today's visits and meeting all  other requirements Future Appointments No visits were found meeting these conditions. Showing future appointments within next 90 days and meeting all other requirements  I discussed the assessment and treatment plan with the patient. The patient was provided an opportunity to ask questions and all were answered. The patient agreed with the plan and demonstrated an understanding of the instructions.  Patient advised to call back or seek an in-person evaluation if the symptoms or condition worsens.  Duration of  encounter: 30 minutes.  Note by: Gillis Santa, MD Date: 03/18/2020; Time: 1:24 PM

## 2020-04-02 ENCOUNTER — Telehealth: Payer: Self-pay | Admitting: Student in an Organized Health Care Education/Training Program

## 2020-04-02 NOTE — Telephone Encounter (Signed)
Spoke with pharmacist, unsure when they will get Butrans patch in stock. Also the cost is high with her, even without PA.

## 2020-04-02 NOTE — Telephone Encounter (Signed)
Please call patient to tell her this and schedule her appt.

## 2020-04-02 NOTE — Telephone Encounter (Signed)
Patient states her meds are on back order, wont be in until Friday plus, the priced has gone up to $150 and she just does not have the money to get them. Wants to know if there is something else Dr. Cherylann Ratel can prescribe for her. She was just here 7-20

## 2020-04-03 ENCOUNTER — Telehealth: Payer: Self-pay

## 2020-04-03 NOTE — Telephone Encounter (Signed)
She is in a lot of pain, she cant afford the patches and the tizanidine is not working.

## 2020-04-03 NOTE — Telephone Encounter (Signed)
PLease make patient a [id number] and move appointment up per Dr Cherylann Ratel.

## 2020-04-03 NOTE — Telephone Encounter (Signed)
Do you have any input, Dr Cherylann Ratel?

## 2020-04-03 NOTE — Telephone Encounter (Signed)
done

## 2020-04-07 ENCOUNTER — Ambulatory Visit
Payer: Medicare Other | Attending: Student in an Organized Health Care Education/Training Program | Admitting: Student in an Organized Health Care Education/Training Program

## 2020-04-08 ENCOUNTER — Encounter: Payer: Medicare Other | Admitting: Student in an Organized Health Care Education/Training Program

## 2020-04-22 ENCOUNTER — Telehealth: Payer: Self-pay | Admitting: Student in an Organized Health Care Education/Training Program

## 2020-04-22 ENCOUNTER — Ambulatory Visit
Payer: Medicare Other | Attending: Student in an Organized Health Care Education/Training Program | Admitting: Student in an Organized Health Care Education/Training Program

## 2020-04-22 ENCOUNTER — Encounter: Payer: Self-pay | Admitting: Student in an Organized Health Care Education/Training Program

## 2020-04-22 ENCOUNTER — Other Ambulatory Visit: Payer: Self-pay

## 2020-04-22 DIAGNOSIS — G894 Chronic pain syndrome: Secondary | ICD-10-CM | POA: Insufficient documentation

## 2020-04-22 DIAGNOSIS — M797 Fibromyalgia: Secondary | ICD-10-CM | POA: Diagnosis not present

## 2020-04-22 DIAGNOSIS — F17200 Nicotine dependence, unspecified, uncomplicated: Secondary | ICD-10-CM | POA: Diagnosis not present

## 2020-04-22 MED ORDER — BUPRENORPHINE 20 MCG/HR TD PTWK: 1.0000 | MEDICATED_PATCH | TRANSDERMAL | 2 refills | Status: DC

## 2020-04-22 MED ORDER — TIZANIDINE HCL 4 MG PO TABS: 6.0000 mg | ORAL_TABLET | Freq: Two times a day (BID) | ORAL | 2 refills | Status: AC | PRN

## 2020-04-22 MED ORDER — ORPHENADRINE CITRATE 30 MG/ML IJ SOLN
30.0000 mg | Freq: Once | INTRAMUSCULAR | Status: AC
  Administered 2020-04-22: 30 mg via INTRAMUSCULAR
  Filled 2020-04-22: qty 2

## 2020-04-22 NOTE — Progress Notes (Signed)
PROVIDER NOTE: Information contained herein reflects review and annotations entered in association with encounter. Interpretation of such information and data should be left to medically-trained personnel. Information provided to patient can be located elsewhere in the medical record under "Patient Instructions". Document created using STT-dictation technology, any transcriptional errors that may result from process are unintentional.    Patient: Bethany Villa  Service Category: E/M  Provider: Gillis Santa, MD  DOB: April 09, 1956  DOS: 04/22/2020  Specialty: Interventional Pain Management  MRN: [id number]  Setting: Ambulatory outpatient  PCP: Marinda Elk, MD  Type: Established Patient    Referring Provider: Marinda Elk, MD  Location: Office  Delivery: Face-to-face     HPI  Reason for encounter: Bethany Villa, a 64 y.o. year old female, is here today for evaluation and management of her No primary diagnosis found.. Bethany Villa primary complain today is Back Pain (low) and Arm Pain (right) Last encounter: Practice (04/07/2020). My last encounter with her was on 04/07/2020. Pertinent problems: Bethany Villa has Generalized anxiety disorder; Fibromyalgia; Chronic bilateral thoracic back pain; Cervicalgia; Chronic knee pain after total replacement of left knee joint; and Bilateral primary osteoarthritis of knee on their pertinent problem list. Pain Assessment: Severity of Chronic pain is reported as a 10-Worst pain ever/10. Location: Back Lower/radiates up back. Onset: More than a month ago. Quality: Sharp, Throbbing, Aching. Timing: Intermittent. Modifying factor(s): rest. Vitals:  height is '5\' 3"'  (1.6 m) and weight is 170 lb (77.1 kg). Her temperature is 98 F (36.7 C). Her blood pressure is 137/94 (abnormal) and her pulse is 96. Her respiration is 18 and oxygen saturation is 97%.   Patient follows up today for medication management.  She is endorsing increased low back pain.  She is also  having more spasms of her low back.  She is requesting an injection today.  She does find benefit with buprenorphine although she states it is costly.  She is requesting a refill of her muscle relaxer.  I clarified to the patient that she should only be on 1, Robaxin or tizanidine.  She does find muscle pain relief with tizanidine which she takes on a as needed basis.  We will refill as below.  Pharmacotherapy Assessment   Analgesic: Butrans patch at 20 mcg an hour Monitoring: Day PMP: PDMP not reviewed this encounter.       Pharmacotherapy: No side-effects or adverse reactions reported. Compliance: No problems identified. Effectiveness: Clinically acceptable.  No notes on file  UDS:  Summary  Date Value Ref Range Status  05/16/2018 FINAL  Final    Comment:    ==================================================================== [facility] COMP DRUG ANALYSIS,UR ==================================================================== Test                             Result       Flag       Units Drug Present and Declared for Prescription Verification   Sertraline                     PRESENT      EXPECTED   Desmethylsertraline            PRESENT      EXPECTED    Desmethylsertraline is an expected metabolite of sertraline.   Trazodone                      PRESENT      EXPECTED  1,3 chlorophenyl piperazine    PRESENT      EXPECTED    1,3-chlorophenyl piperazine is an expected metabolite of    trazodone. Drug Present not Declared for Prescription Verification   Buprenorphine                  11           UNEXPECTED ng/mg creat   Norbuprenorphine               45           UNEXPECTED ng/mg creat    Source of buprenorphine is a scheduled prescription medication.    Norbuprenorphine is an expected metabolite of buprenorphine.   Methocarbamol                  PRESENT      UNEXPECTED Drug Absent but Declared for Prescription Verification   Tizanidine                     Not Detected UNEXPECTED     Tizanidine, as indicated in the declared medication list, is not    always detected even when used as directed. ==================================================================== Test                      Result    Flag   Units      Ref Range   Creatinine              126              mg/dL      >=20 ==================================================================== Declared Medications:  The flagging and interpretation on this report are based on the  following declared medications.  Unexpected results may arise from  inaccuracies in the declared medications.  **Note: The testing scope of this panel includes these medications:  Sertraline (Zoloft)  Trazodone  **Note: The testing scope of this panel does not include small to  moderate amounts of these reported medications:  Tizanidine  **Note: The testing scope of this panel does not include following  reported medications:  Albuterol (Proventil)  Atorvastatin (Lipitor)  Buspirone (BuSpar) ==================================================================== For clinical consultation, please call [phone]. ====================================================================      ROS  Constitutional: Denies any fever or chills Gastrointestinal: No reported hemesis, hematochezia, vomiting, or acute GI distress Musculoskeletal: Low back pain, paraspinal muscle spasms. Neurological: No reported episodes of acute onset apraxia, aphasia, dysarthria, agnosia, amnesia, paralysis, loss of coordination, or loss of consciousness  Medication Review  ALPRAZolam, Fluticasone-Umeclidin-Vilant, acetaminophen, albuterol, atorvastatin, buprenorphine, busPIRone, furosemide, guaiFENesin-dextromethorphan, ondansetron, phenol, sertraline, tiZANidine, and topiramate  History Review  Allergy: Bethany Villa is allergic to gabapentin, mobic [meloxicam], penicillins, and sulfa antibiotics. Drug: Bethany Villa  reports no history of drug use. Alcohol:   reports current alcohol use. Tobacco:  reports that she has been smoking cigarettes. She has a 45.00 pack-year smoking history. She has never used smokeless tobacco. Social: Bethany Villa  reports that she has been smoking cigarettes. She has a 45.00 pack-year smoking history. She has never used smokeless tobacco. She reports current alcohol use. She reports that she does not use drugs. Medical:  has a past medical history of Anxiety, Asthma, Chronic pain, Chronic systolic CHF (congestive heart failure) ([place]) (09/22/2019), COPD (chronic obstructive pulmonary disease) ([place]), Depression, DJD (degenerative joint disease), Fibromyalgia, GERD (gastroesophageal reflux disease), Headache, Hyperlipidemia, Lower extremity edema, On home oxygen therapy, Panic attack, Shortness of breath dyspnea, and Sleep disorder. Surgical: Bethany Villa  has a past surgical history that includes Abdominal surgery; Carpal tunnel release; Ankle surgery; Knee surgery; Cesarean section; Tonsillectomy; Tubal ligation; Back surgery; and Total knee arthroplasty (Left, 04/14/2016). Family: family history includes Cancer in her mother.  Laboratory Chemistry Profile   Renal Lab Results  Component Value Date   BUN 20 10/11/2019   CREATININE 0.63 10/11/2019   BCR 16 08/01/2015   GFRAA >60 10/11/2019   GFRNONAA >60 10/11/2019     Hepatic Lab Results  Component Value Date   AST 29 10/07/2019   ALT 29 10/07/2019   ALBUMIN 3.4 (L) 10/07/2019   ALKPHOS 91 10/07/2019   AMYLASE 39 10/01/2017   LIPASE 18 10/01/2017     Electrolytes Lab Results  Component Value Date   NA 140 10/11/2019   K 4.0 10/11/2019   CL 102 10/11/2019   CALCIUM 8.9 10/11/2019   MG 2.0 10/11/2019   PHOS 5.1 (H) 09/04/2015     Bone No results found for: VD25OH, UY403KV4QVZ, DG3875IE3, PI9518AC1, 25OHVITD1, 25OHVITD2, 25OHVITD3, TESTOFREE, TESTOSTERONE   Inflammation (CRP: Acute Phase) (ESR: Chronic Phase) Lab Results  Component Value Date   LATICACIDVEN  1.1 10/07/2019       Note: Above Lab results reviewed.  Recent Imaging Review  X-ray chest PA and lateral CLINICAL DATA:  COPD, hypercapnic respiratory failure  EXAM: CHEST - 2 VIEW  COMPARISON:  Chest radiograph from one day prior.  FINDINGS: Stable cardiomediastinal silhouette with mild cardiomegaly. No pneumothorax. No pleural effusion. No overt pulmonary edema. No acute consolidative airspace disease.  IMPRESSION: Stable mild cardiomegaly without overt pulmonary edema. No active pulmonary disease.  Electronically Signed   By: Ilona Sorrel M.D.   On: 10/08/2019 08:00 Note: Reviewed        Physical Exam  General appearance: Well nourished, well developed, and well hydrated. In no apparent acute distress Mental status: Alert, oriented x 3 (person, place, & time)       Respiratory: No evidence of acute respiratory distress Eyes: PERLA Vitals: BP (!) 137/94   Pulse 96   Temp 98 F (36.7 C)   Resp 18   Ht '5\' 3"'  (1.6 m)   Wt 170 lb (77.1 kg)   SpO2 97%   BMI 30.11 kg/m  BMI: Estimated body mass index is 30.11 kg/m as calculated from the following:   Height as of this encounter: '5\' 3"'  (1.6 m).   Weight as of this encounter: 170 lb (77.1 kg). Ideal: Ideal body weight: 52.4 kg (115 lb 8.3 oz) Adjusted ideal body weight: 62.3 kg (137 lb 5 oz)   Lumbar Spine Area Exam  Skin & Axial Inspection: No masses, redness, or swelling Alignment: Symmetrical Functional ROM: Pain restricted ROM       Stability: No instability detected Muscle Tone/Strength: Functionally intact. No obvious neuro-muscular anomalies detected. Sensory (Neurological): Musculoskeletal pain pattern  Provocative Tests: Hyperextension/rotation test: (+) bilaterally for facet joint pain. Lumbar quadrant test (Kemp's test): (+) bilaterally for facet joint pain. Lateral bending test: deferred today       Patrick's Maneuver: deferred today                   FABER* test: (+) for bilateral S-I arthralgia              S-I anterior distraction/compression test: deferred today         S-I lateral compression test: deferred today         S-I Thigh-thrust test: deferred today  S-I Gaenslen's test: deferred today         *(Flexion, ABduction and External Rotation) Gait & Posture Assessment  Ambulation: Limited Gait: Relatively normal for age and body habitus Posture: Difficulty standing up straight, due to pain  Lower Extremity Exam    Side: Right lower extremity  Side: Left lower extremity  Stability: No instability observed          Stability: No instability observed          Skin & Extremity Inspection: Skin color, temperature, and hair growth are WNL. No peripheral edema or cyanosis. No masses, redness, swelling, asymmetry, or associated skin lesions. No contractures.  Skin & Extremity Inspection: Evidence of prior arthroplastic surgery  Functional ROM: Pain restricted ROM for hip joint          Functional ROM: Pain restricted ROM for hip and knee joints          Muscle Tone/Strength: Functionally intact. No obvious neuro-muscular anomalies detected.  Muscle Tone/Strength: Functionally intact. No obvious neuro-muscular anomalies detected.  Sensory (Neurological): Musculoskeletal pain pattern        Sensory (Neurological): Musculoskeletal pain pattern        DTR: Patellar: deferred today Achilles: deferred today Plantar: deferred today  DTR: Patellar: deferred today Achilles: deferred today Plantar: deferred today  Palpation: No palpable anomalies  Palpation: No palpable anomalies    Assessment   Status Diagnosis  Controlled Controlled Controlled 1. Chronic pain disorder   2. Fibromyalgia   3. Smoking      Updated Problems: Problem  Bilateral Primary Osteoarthritis of Knee  Chronic Bilateral Thoracic Back Pain  Cervicalgia  Chronic Knee Pain After Total Replacement of Left Knee Joint  Generalized Anxiety Disorder  Fibromyalgia  Smoking   Patient has chronic pain.  Studies show that people with chronic pain tend to be heavier smokers and often smoke in response to pain. Chronic pain makes smoking cessation more difficult and increases the risk of relapse after quitting.  Pt and I discussed the importance of managing pain during the process of smoking cessation.  We have developed a plan for smoking cessation and relapse prevention that includes management of of smoking in response to pain.      Plan of Care   Bethany Villa has a current medication list which includes the following long-term medication(s): albuterol, albuterol, atorvastatin, and sertraline.  Pharmacotherapy (Medications Ordered): Meds ordered this encounter  Medications  . tiZANidine (ZANAFLEX) 4 MG tablet    Sig: Take 1.5 tablets (6 mg total) by mouth 2 (two) times daily as needed for muscle spasms.    Dispense:  60 tablet    Refill:  2    Do not place this medication, or any other prescription from our practice, on "Automatic Refill". Patient may have prescription filled one day early if pharmacy is closed on scheduled refill date.  . buprenorphine (BUTRANS) 20 MCG/HR PTWK    Sig: Place 1 patch onto the skin every 7 (seven) days.    Dispense:  4 patch    Refill:  2    For chronic pain syndrome  . orphenadrine (NORFLEX) injection 30 mg   Follow-up plan:   Return in about 3 months (around 07/23/2020) for Medication Management, in person.   Recent Visits Date Type Provider Dept  03/18/20 Telemedicine Gillis Santa, MD [facility]  Showing recent visits within past 90 days and meeting all other requirements Today's Visits Date Type Provider Dept  04/22/20 Office Visit Holley Raring,  Carlus Pavlov, MD [facility]  Showing today's visits and meeting all other requirements Future Appointments No visits were found meeting these conditions. Showing future appointments within next 90 days and meeting all other requirements  I discussed the assessment and treatment plan  with the patient. The patient was provided an opportunity to ask questions and all were answered. The patient agreed with the plan and demonstrated an understanding of the instructions.  Patient advised to call back or seek an in-person evaluation if the symptoms or condition worsens.  Duration of encounter: 30 minutes.  Note by: Gillis Santa, MD Date: 04/22/2020; Time: 10:54 AM

## 2020-04-22 NOTE — Telephone Encounter (Signed)
Called patient back to let her know that I had sent the message to Dr Cherylann Ratel and he responded that she could use Tramadol ER, however he felt she had tried this in the past and was ineffective. Patient does agree that the tramadol was ineffective and will stick with the patches.  I did tell her that she could ask the pharmacist if there was a program that might help with price reduction.  Patient verbalizes u/o information.

## 2020-04-22 NOTE — Telephone Encounter (Signed)
Patient called asking if there is an alternative to the patches as they cost her $150. She states she asked Dr. Cherylann Ratel but did not get an answer.

## 2020-06-10 ENCOUNTER — Emergency Department: Payer: Medicare Other

## 2020-06-10 ENCOUNTER — Inpatient Hospital Stay
Admission: EM | Admit: 2020-06-10 | Discharge: 2020-06-17 | DRG: 466 | Disposition: A | Discharge status: Skilled Nursing Facility | Payer: Medicare Other | Attending: Hospitalist | Admitting: Hospitalist

## 2020-06-10 ENCOUNTER — Other Ambulatory Visit: Payer: Self-pay

## 2020-06-10 DIAGNOSIS — J441 Chronic obstructive pulmonary disease with (acute) exacerbation: Secondary | ICD-10-CM | POA: Diagnosis present

## 2020-06-10 DIAGNOSIS — Z79899 Other long term (current) drug therapy: Secondary | ICD-10-CM | POA: Diagnosis not present

## 2020-06-10 DIAGNOSIS — B953 Streptococcus pneumoniae as the cause of diseases classified elsewhere: Secondary | ICD-10-CM | POA: Diagnosis not present

## 2020-06-10 DIAGNOSIS — Z880 Allergy status to penicillin: Secondary | ICD-10-CM

## 2020-06-10 DIAGNOSIS — E669 Obesity, unspecified: Secondary | ICD-10-CM | POA: Diagnosis present

## 2020-06-10 DIAGNOSIS — A419 Sepsis, unspecified organism: Secondary | ICD-10-CM

## 2020-06-10 DIAGNOSIS — J189 Pneumonia, unspecified organism: Principal | ICD-10-CM | POA: Diagnosis present

## 2020-06-10 DIAGNOSIS — F411 Generalized anxiety disorder: Secondary | ICD-10-CM | POA: Diagnosis present

## 2020-06-10 DIAGNOSIS — A403 Sepsis due to Streptococcus pneumoniae: Secondary | ICD-10-CM | POA: Diagnosis present

## 2020-06-10 DIAGNOSIS — F17210 Nicotine dependence, cigarettes, uncomplicated: Secondary | ICD-10-CM | POA: Diagnosis present

## 2020-06-10 DIAGNOSIS — I5022 Chronic systolic (congestive) heart failure: Secondary | ICD-10-CM | POA: Diagnosis present

## 2020-06-10 DIAGNOSIS — R350 Frequency of micturition: Secondary | ICD-10-CM | POA: Diagnosis not present

## 2020-06-10 DIAGNOSIS — Z6830 Body mass index (BMI) 30.0-30.9, adult: Secondary | ICD-10-CM | POA: Diagnosis not present

## 2020-06-10 DIAGNOSIS — Z9981 Dependence on supplemental oxygen: Secondary | ICD-10-CM

## 2020-06-10 DIAGNOSIS — Y831 Surgical operation with implant of artificial internal device as the cause of abnormal reaction of the patient, or of later complication, without mention of misadventure at the time of the procedure: Secondary | ICD-10-CM | POA: Diagnosis present

## 2020-06-10 DIAGNOSIS — M797 Fibromyalgia: Secondary | ICD-10-CM | POA: Diagnosis present

## 2020-06-10 DIAGNOSIS — Z96652 Presence of left artificial knee joint: Secondary | ICD-10-CM | POA: Diagnosis present

## 2020-06-10 DIAGNOSIS — G8929 Other chronic pain: Secondary | ICD-10-CM | POA: Diagnosis present

## 2020-06-10 DIAGNOSIS — R062 Wheezing: Secondary | ICD-10-CM

## 2020-06-10 DIAGNOSIS — Z20822 Contact with and (suspected) exposure to covid-19: Secondary | ICD-10-CM | POA: Diagnosis present

## 2020-06-10 DIAGNOSIS — F32A Depression, unspecified: Secondary | ICD-10-CM | POA: Diagnosis present

## 2020-06-10 DIAGNOSIS — T8450XA Infection and inflammatory reaction due to unspecified internal joint prosthesis, initial encounter: Secondary | ICD-10-CM | POA: Diagnosis not present

## 2020-06-10 DIAGNOSIS — E785 Hyperlipidemia, unspecified: Secondary | ICD-10-CM | POA: Diagnosis present

## 2020-06-10 DIAGNOSIS — J440 Chronic obstructive pulmonary disease with acute lower respiratory infection: Secondary | ICD-10-CM | POA: Diagnosis present

## 2020-06-10 DIAGNOSIS — M25562 Pain in left knee: Secondary | ICD-10-CM

## 2020-06-10 DIAGNOSIS — K219 Gastro-esophageal reflux disease without esophagitis: Secondary | ICD-10-CM | POA: Diagnosis present

## 2020-06-10 DIAGNOSIS — T8454XA Infection and inflammatory reaction due to internal left knee prosthesis, initial encounter: Principal | ICD-10-CM | POA: Diagnosis present

## 2020-06-10 DIAGNOSIS — Z882 Allergy status to sulfonamides status: Secondary | ICD-10-CM | POA: Diagnosis not present

## 2020-06-10 DIAGNOSIS — Z7951 Long term (current) use of inhaled steroids: Secondary | ICD-10-CM

## 2020-06-10 DIAGNOSIS — R7881 Bacteremia: Secondary | ICD-10-CM | POA: Diagnosis not present

## 2020-06-10 DIAGNOSIS — J9611 Chronic respiratory failure with hypoxia: Secondary | ICD-10-CM | POA: Diagnosis present

## 2020-06-10 DIAGNOSIS — Z79891 Long term (current) use of opiate analgesic: Secondary | ICD-10-CM

## 2020-06-10 DIAGNOSIS — J9620 Acute and chronic respiratory failure, unspecified whether with hypoxia or hypercapnia: Secondary | ICD-10-CM | POA: Diagnosis not present

## 2020-06-10 DIAGNOSIS — L03116 Cellulitis of left lower limb: Secondary | ICD-10-CM | POA: Diagnosis present

## 2020-06-10 LAB — RESPIRATORY PANEL BY RT PCR (FLU A&B, COVID)
Influenza A by PCR: NEGATIVE
Influenza B by PCR: NEGATIVE
SARS Coronavirus 2 by RT PCR: NEGATIVE

## 2020-06-10 LAB — CBC
HCT: 47.6 % — ABNORMAL HIGH (ref 36.0–46.0)
Hemoglobin: 15.1 g/dL — ABNORMAL HIGH (ref 12.0–15.0)
MCH: 28.6 pg (ref 26.0–34.0)
MCHC: 31.7 g/dL (ref 30.0–36.0)
MCV: 90.2 fL (ref 80.0–100.0)
Platelets: 203 10*3/uL (ref 150–400)
RBC: 5.28 MIL/uL — ABNORMAL HIGH (ref 3.87–5.11)
RDW: 13.8 % (ref 11.5–15.5)
WBC: 17.3 10*3/uL — ABNORMAL HIGH (ref 4.0–10.5)
nRBC: 0 % (ref 0.0–0.2)

## 2020-06-10 LAB — BASIC METABOLIC PANEL
Anion gap: 18 — ABNORMAL HIGH (ref 5–15)
BUN: 20 mg/dL (ref 8–23)
CO2: 22 mmol/L (ref 22–32)
Calcium: 8.8 mg/dL — ABNORMAL LOW (ref 8.9–10.3)
Chloride: 96 mmol/L — ABNORMAL LOW (ref 98–111)
Creatinine, Ser: 0.8 mg/dL (ref 0.44–1.00)
GFR, Estimated: >60 mL/min (ref >60)
Glucose, Bld: 121 mg/dL — ABNORMAL HIGH (ref 70–99)
Potassium: 3.7 mmol/L (ref 3.5–5.1)
Sodium: 136 mmol/L (ref 135–145)

## 2020-06-10 LAB — URINALYSIS, COMPLETE (UACMP) WITH MICROSCOPIC
Bacteria, UA: NONE SEEN
Glucose, UA: NEGATIVE mg/dL
Ketones, ur: 5 mg/dL — AB
Leukocytes,Ua: NEGATIVE
Nitrite: NEGATIVE
Protein, ur: 100 mg/dL — AB
Specific Gravity, Urine: 1.024 (ref 1.005–1.030)
pH: 6 (ref 5.0–8.0)

## 2020-06-10 LAB — LACTIC ACID, PLASMA: Lactic Acid, Venous: 1.7 mmol/L (ref 0.5–1.9)

## 2020-06-10 LAB — PROCALCITONIN: Procalcitonin: 1.24 ng/mL

## 2020-06-10 MED ORDER — LIDOCAINE HCL (PF) 1 % IJ SOLN
INTRAMUSCULAR | Status: AC
  Filled 2020-06-10: qty 10

## 2020-06-10 MED ORDER — SODIUM CHLORIDE 0.9 % IV SOLN
500.0000 mg | Freq: Once | INTRAVENOUS | Status: AC
  Administered 2020-06-10: 500 mg via INTRAVENOUS
  Filled 2020-06-10: qty 500

## 2020-06-10 MED ORDER — TOPIRAMATE 25 MG PO TABS
25.0000 mg | ORAL_TABLET | Freq: Two times a day (BID) | ORAL | Status: DC
  Administered 2020-06-11 – 2020-06-17 (×13): 25 mg via ORAL
  Filled 2020-06-10 (×15): qty 1

## 2020-06-10 MED ORDER — SERTRALINE HCL 50 MG PO TABS
50.0000 mg | ORAL_TABLET | Freq: Every day | ORAL | Status: DC
  Administered 2020-06-12 – 2020-06-17 (×6): 50 mg via ORAL
  Filled 2020-06-10 (×6): qty 1

## 2020-06-10 MED ORDER — BUSPIRONE HCL 10 MG PO TABS
5.0000 mg | ORAL_TABLET | Freq: Three times a day (TID) | ORAL | Status: DC
  Administered 2020-06-11 – 2020-06-17 (×16): 5 mg via ORAL
  Filled 2020-06-10 (×17): qty 1

## 2020-06-10 MED ORDER — LACTATED RINGERS IV BOLUS
1000.0000 mL | Freq: Once | INTRAVENOUS | Status: AC
  Administered 2020-06-10: 1000 mL via INTRAVENOUS

## 2020-06-10 MED ORDER — ONDANSETRON HCL 4 MG PO TABS: 4.0000 mg | ORAL_TABLET | Freq: Four times a day (QID) | ORAL | Status: DC | PRN

## 2020-06-10 MED ORDER — TRAZODONE HCL 50 MG PO TABS
25.0000 mg | ORAL_TABLET | Freq: Every evening | ORAL | Status: DC | PRN
  Administered 2020-06-12 – 2020-06-15 (×3): 25 mg via ORAL
  Filled 2020-06-10 (×3): qty 1

## 2020-06-10 MED ORDER — SODIUM CHLORIDE 0.9 % IV SOLN: INTRAVENOUS | Status: DC

## 2020-06-10 MED ORDER — FUROSEMIDE 40 MG PO TABS: 40.0000 mg | ORAL_TABLET | Freq: Every day | ORAL | Status: DC

## 2020-06-10 MED ORDER — MAGNESIUM HYDROXIDE 400 MG/5ML PO SUSP
30.0000 mL | Freq: Every day | ORAL | Status: DC | PRN
  Filled 2020-06-10: qty 30

## 2020-06-10 MED ORDER — HYDROCOD POLST-CPM POLST ER 10-8 MG/5ML PO SUER
5.0000 mL | Freq: Two times a day (BID) | ORAL | Status: DC | PRN
  Administered 2020-06-11 – 2020-06-15 (×5): 5 mL via ORAL
  Filled 2020-06-10 (×5): qty 5

## 2020-06-10 MED ORDER — GUAIFENESIN ER 600 MG PO TB12
600.0000 mg | ORAL_TABLET | Freq: Two times a day (BID) | ORAL | Status: DC
  Administered 2020-06-11 – 2020-06-17 (×13): 600 mg via ORAL
  Filled 2020-06-10 (×13): qty 1

## 2020-06-10 MED ORDER — ACETAMINOPHEN 325 MG PO TABS: 650.0000 mg | ORAL_TABLET | Freq: Four times a day (QID) | ORAL | Status: DC | PRN

## 2020-06-10 MED ORDER — BUPRENORPHINE 20 MCG/HR TD PTWK: 1.0000 | MEDICATED_PATCH | TRANSDERMAL | Status: DC

## 2020-06-10 MED ORDER — IPRATROPIUM-ALBUTEROL 0.5-2.5 (3) MG/3ML IN SOLN
3.0000 mL | Freq: Four times a day (QID) | RESPIRATORY_TRACT | Status: DC
  Administered 2020-06-11 – 2020-06-14 (×9): 3 mL via RESPIRATORY_TRACT
  Filled 2020-06-10 (×10): qty 3

## 2020-06-10 MED ORDER — VANCOMYCIN HCL IN DEXTROSE 1-5 GM/200ML-% IV SOLN
1000.0000 mg | Freq: Once | INTRAVENOUS | Status: AC
  Administered 2020-06-11: 1000 mg via INTRAVENOUS
  Filled 2020-06-10: qty 200

## 2020-06-10 MED ORDER — ALPRAZOLAM 0.5 MG PO TABS
0.5000 mg | ORAL_TABLET | Freq: Two times a day (BID) | ORAL | Status: DC | PRN
  Administered 2020-06-13 – 2020-06-16 (×4): 0.5 mg via ORAL
  Filled 2020-06-10 (×5): qty 1

## 2020-06-10 MED ORDER — ACETAMINOPHEN 650 MG RE SUPP: 650.0000 mg | Freq: Four times a day (QID) | RECTAL | Status: DC | PRN

## 2020-06-10 MED ORDER — ONDANSETRON HCL 4 MG/2ML IJ SOLN: 4.0000 mg | Freq: Four times a day (QID) | INTRAMUSCULAR | Status: DC | PRN

## 2020-06-10 MED ORDER — TIZANIDINE HCL 4 MG PO TABS
6.0000 mg | ORAL_TABLET | Freq: Two times a day (BID) | ORAL | Status: DC | PRN
  Filled 2020-06-10: qty 1

## 2020-06-10 MED ORDER — SODIUM CHLORIDE 0.9 % IV SOLN
1.0000 g | Freq: Once | INTRAVENOUS | Status: AC
  Administered 2020-06-10: 1 g via INTRAVENOUS
  Filled 2020-06-10: qty 10

## 2020-06-10 MED ORDER — ATORVASTATIN CALCIUM 20 MG PO TABS
20.0000 mg | ORAL_TABLET | Freq: Every day | ORAL | Status: DC
  Administered 2020-06-11 – 2020-06-16 (×5): 20 mg via ORAL
  Filled 2020-06-10 (×6): qty 1

## 2020-06-10 MED ORDER — ONDANSETRON HCL 4 MG PO TABS: 4.0000 mg | ORAL_TABLET | ORAL | Status: DC | PRN

## 2020-06-10 MED ORDER — ENOXAPARIN SODIUM 40 MG/0.4ML ~~LOC~~ SOLN: 40.0000 mg | SUBCUTANEOUS | Status: DC

## 2020-06-10 NOTE — ED Notes (Signed)
Pt tolerating PO

## 2020-06-10 NOTE — ED Provider Notes (Signed)
[facility] Emergency Department Provider Note  ____________________________________________   I have reviewed the triage vital signs and the nursing notes.   HISTORY  Chief Complaint Left knee pain  History limited by: Not Limited   HPI Basilio Cairoenny E Breece is a 64 y.o. female who presents to the emergency department today because of concerns for left knee pain.  The patient states that she first started having some pain in her left knee last night.  This morning however the patient was able able to bend her knee or put weight on it.  She has noticed some swelling around the knee.  Patient has distant history of left knee replacement and states that she had been doing fine with it.  In addition the patient has history of asthma and states that her breathing has been worse over the past couple of days.  She has had some productive cough.  She has been trying her nebulizer machines at home without any significant relief.  Records reviewed. Per medical record review patient has a history of COPD, left knee replacement.  Past Medical History:  Diagnosis Date  . Anxiety   . Asthma   . Chronic pain    lower back  . Chronic systolic CHF (congestive heart failure) (HCC) 09/22/2019  . COPD (chronic obstructive pulmonary disease) (HCC)   . Depression   . DJD (degenerative joint disease)   . Fibromyalgia   . GERD (gastroesophageal reflux disease)   . Headache   . Hyperlipidemia   . Lower extremity edema   . On home oxygen therapy    has not been on since 2016  . Panic attack   . Shortness of breath dyspnea   . Sleep disorder     Patient Active Problem List   Diagnosis Date Noted  . Acute hypercapnic respiratory failure (HCC) 10/07/2019  . Altered mental status   . Chronic systolic CHF (congestive heart failure) (HCC) 09/22/2019  . Degeneration of cervical intervertebral disc 07/05/2019  . Bilateral primary osteoarthritis of knee 04/26/2019  . Chronic bilateral  thoracic back pain 12/05/2018  . Cervicalgia 12/05/2018  . 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 Acute adjustment disorder with anxiety 10/06/2017  . Palliative care by specialist   . Respiratory failure (HCC) 10/01/2017  . COPD exacerbation (HCC) 09/01/2016  . Elevated brain natriuretic peptide (BNP) level 09/01/2016  . LFT elevation 09/01/2016  . 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 Chronic obstructive pulmonary disease with acute lower respiratory infection (HCC)   . Acute respiratory failure with hypoxia and hypercapnia (HCC)   . Sepsis (HCC) 09/03/2015  . CAP (community acquired pneumonia) 09/01/2015  . SIRS (systemic inflammatory response syndrome) (HCC) 07/24/2014  . COPD with acute exacerbation (HCC) 09/29/2013  . Acute on chronic respiratory failure with hypercapnia (HCC) 11/27/2012  . Insomnia 11/04/2012  . Generalized anxiety disorder 11/04/2012  . Depression 11/04/2012  . Fibromyalgia 11/04/2012  . Metabolic encephalopathy 10/03/2011  . COPD (chronic obstructive pulmonary disease) (HCC) 06/01/2011  . Smoking 05/19/2011  . Obesity 05/19/2011  . Hyperglycemia 05/19/2011    Past Surgical History:  Procedure Laterality Date  . ABDOMINAL SURGERY    . ANKLE SURGERY    . BACK SURGERY     lower  . CARPAL TUNNEL RELEASE    . CESAREAN SECTION    . KNEE SURGERY    . TONSILLECTOMY    . TOTAL KNEE ARTHROPLASTY Left 04/14/2016   Procedure: TOTAL KNEE ARTHROPLASTY;  Surgeon: Deeann Saint, MD;  Location: [facility] ORS;  Service: Orthopedics;  Laterality: Left;  . TUBAL LIGATION      Prior to Admission medications   Medication Sig Start Date End Date Taking? Authorizing Provider  acetaminophen (TYLENOL) 500 MG tablet Take 2 tablets (1,000 mg total) by mouth every 12 (twelve) hours as needed for mild pain or moderate pain. 10/01/19 09/30/20  Edward Jolly, MD  albuterol (PROVENTIL) (2.5 MG/3ML) 0.083% nebulizer solution TAKE 3 ML BY  NEBULIZATION Q 6 hrs prn FOR WHEEZING OR SOB Dx J44.9 10/06/17   Enedina Finner, MD  albuterol (VENTOLIN HFA) 108 (90 Base) MCG/ACT inhaler INHALE 2 PUFFS BY MOUTH EVERY 6 HOURS AS NEEDED FOR WHEEZE 10/20/18   [provider]  ALPRAZolam Prudy Feeler) 0.5 MG tablet Take 0.5 mg by mouth 2 (two) times daily as needed. 07/25/19   [provider]  atorvastatin (LIPITOR) 20 MG tablet TAKE 1 TABLET (20 MG TOTAL) BY MOUTH DAILY. 04/29/16   Daphine Deutscher, Mary-Margaret, FNP  buprenorphine (BUTRANS) 20 MCG/HR PTWK Place 1 patch onto the skin every 7 (seven) days. 04/22/20 07/15/20  Edward Jolly, MD  busPIRone (BUSPAR) 5 MG tablet Take 5 mg by mouth 3 (three) times daily.     [provider]  Fluticasone-Umeclidin-Vilant (TRELEGY ELLIPTA) 100-62.5-25 MCG/INH AEPB Inhale 1 puff into the lungs daily. 07/11/19   [provider]  furosemide (LASIX) 40 MG tablet Take 40 mg by mouth daily. 07/11/19   [provider]  guaiFENesin-dextromethorphan (ROBITUSSIN DM) 100-10 MG/5ML syrup Take 5 mLs by mouth every 4 (four) hours as needed for cough. 06/13/19   Delfino Lovett, MD  ondansetron (ZOFRAN) 4 MG tablet Take 4 mg by mouth as needed for nausea/vomiting. 09/04/19   [provider]  phenol (CHLORASEPTIC) 1.4 % LIQD Use as directed 1 spray in the mouth or throat as needed for throat irritation / pain. 06/13/19   Delfino Lovett, MD  sertraline (ZOLOFT) 50 MG tablet Take 1 tablet (50 mg total) by mouth daily. 10/08/17   Enedina Finner, MD  tiZANidine (ZANAFLEX) 4 MG tablet Take 1.5 tablets (6 mg total) by mouth 2 (two) times daily as needed for muscle spasms. 04/22/20 07/21/20  Edward Jolly, MD  topiramate (TOPAMAX) 25 MG tablet Take 25 mg by mouth 2 (two) times daily. 08/02/19   [provider]    Allergies Gabapentin, Mobic [meloxicam], Penicillins, and Sulfa antibiotics  Family History  Problem Relation Age of Onset  . Cancer Mother        beast    Social History Social History    Tobacco Use  . Smoking status: Current Every Day Smoker    Packs/day: 1.00    Years: 45.00    Pack years: 45.00    Types: Cigarettes  . Smokeless tobacco: Never Used  Vaping Use  . Vaping Use: Never used  Substance Use Topics  . Alcohol use: Yes    Alcohol/week: 0.0 standard drinks    Comment: rare  . Drug use: No    Review of Systems Constitutional: No fever/chills Eyes: No visual changes. ENT: No sore throat. Cardiovascular: Positive for chest pain. Respiratory: Positive for shortness of breath. Gastrointestinal: No abdominal pain.  No nausea, no vomiting.  No diarrhea.   Genitourinary: Negative for dysuria. Musculoskeletal: Positive for left knee pain and effusion. Skin: Negative for rash. Neurological: Negative for headaches, focal weakness or numbness.  ____________________________________________   PHYSICAL EXAM:  VITAL SIGNS: ED Triage Vitals  Enc Vitals Group  BP 06/10/20 1807 126/86     Pulse Rate 06/10/20 1807 (!) 123     Resp 06/10/20 1807 20     Temp 06/10/20 1807 98.6 F (37 C)     Temp Source 06/10/20 1807 Oral     SpO2 06/10/20 1807 96 %     Weight 06/10/20 1808 170 lb (77.1 kg)     Height 06/10/20 1808 5\' 3"  (1.6 m)     Head Circumference --      Peak Flow --      Pain Score 06/10/20 1808 10   Constitutional: Alert and oriented.  Eyes: Conjunctivae are normal.  ENT      Head: Normocephalic and atraumatic.      Nose: No congestion/rhinnorhea.      Mouth/Throat: Mucous membranes are moist.      Neck: No stridor. Hematological/Lymphatic/Immunilogical: No cervical lymphadenopathy. Cardiovascular: Tachycardic. Regular rhythm..  No murmurs, rubs, or gallops.  Respiratory: Tachypnea. Some wheezing appreciated.  Gastrointestinal: Soft and non tender. No rebound. No guarding.  Genitourinary: Deferred Musculoskeletal: Left knee with moderate effusion, warm, tender to palpation and manipulation. No overlying erythema.  Neurologic:  Normal  speech and language. No gross focal neurologic deficits are appreciated.  Skin:  Skin is warm, dry and intact. No rash noted. Psychiatric: Mood and affect are normal. Speech and behavior are normal. Patient exhibits appropriate insight and judgment.  ____________________________________________    LABS (pertinent positives/negatives)  CBC wbc 17.3, hgb 15.1, plt 203 BMP na 136, k 3.7, glu 121, cr 0.80 Procalcitonin 1.24 UA hazy, small hgb dipstick, protein 100, 0-5 rbc and wbc ____________________________________________   EKG  None  ____________________________________________    RADIOLOGY  CXR Pneumonia  Left knee Small left effusion  ____________________________________________   PROCEDURES  Procedures  ARTHOCENTESIS Performed by: 08/10/20 Consent: Verbal consent obtained. Risks and benefits: risks, benefits and alternatives were discussed Consent given by: patient Required items: required blood products, implants, devices, and special equipment available Patient identity confirmed: verbally with patient Indications: pain, swelling Joint: left knee Local anesthesia used: 1% lidocaine Preparation: Patient was prepped and draped in the usual sterile fashion. Aspirate appearance: yellow, hazy Aspirate amount: 30 ml Patient tolerance: Patient tolerated the procedure well with no immediate complications.  CRITICAL CARE Performed by: Phineas Semen   Total critical care time: 35 minutes  Critical care time was exclusive of separately billable procedures and treating other patients.  Critical care was necessary to treat or prevent imminent or life-threatening deterioration.  Critical care was time spent personally by me on the following activities: development of treatment plan with patient and/or surrogate as well as nursing, discussions with consultants, evaluation of patient's response to treatment, examination of patient, obtaining history from  patient or surrogate, ordering and performing treatments and interventions, ordering and review of laboratory studies, ordering and review of radiographic studies, pulse oximetry and re-evaluation of patient's condition.  ____________________________________________   INITIAL IMPRESSION / ASSESSMENT AND PLAN / ED COURSE  Pertinent labs & imaging results that were available during my care of the patient were reviewed by me and considered in my medical decision making (see chart for details).   Patient presented to the emergency department today because of concerns for left knee pain as well as some shortness of breath.  On exam patient does have swelling to the left knee and it is quite painful to palpation or manipulation.  Additionally it feels warm.  The patient was additionally was complaining of shortness of breath.  Chest  x-ray is concerning for pneumonia.  Patient has elevated white count, procalcitonin.  Patient was started on antibiotics.  I did perform an arthrocentesis of the left knee given concern for possible septic arthritis.  Results of the arthrocentesis are pending at time of discharge.  Discussed concerns and plan of admission to patient.  ____________________________________________   FINAL CLINICAL IMPRESSION(S) / ED DIAGNOSES  Final diagnoses:  Pneumonia due to infectious organism, unspecified laterality, unspecified part of lung  Acute pain of left knee     Note: This dictation was prepared with Dragon dictation. Any transcriptional errors that result from this process are unintentional     Phineas Semen, MD 06/10/20 2342

## 2020-06-10 NOTE — H&P (Addendum)
[place]   PATIENT NAME: Bethany Villa    MR#:  [id number]  DATE OF BIRTH:  01-Aug-1956  DATE OF ADMISSION:  06/10/2020  PRIMARY CARE PHYSICIAN: [name], MD   REQUESTING/REFERRING PHYSICIAN: Phineas Semen, MD  CHIEF COMPLAINT:   Chief Complaint  Patient presents with  . Shortness of Breath  . Knee Pain    HISTORY OF PRESENT ILLNESS:  Bethany Villa  is a 64 y.o. Caucasian female with a known history of asthma, dyslipidemia, fibromyalgia, GERD, COPD, depression and anxiety, who presented to the emergency room with acute onset of dyspnea which has been worsening over the last week with associated cough productive of clear sputum as well as wheezing with nausea without vomiting or abdominal pain.  She is having mild dizziness without headache or blurred vision or paresthesias or focal muscle weakness..  She is also been having mildly worsening left knee pain with swelling since earlier during the day.  She denied any insect bites or trauma.  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  She admits to fever and chills.  No chest pain or palpitations.  Upon presentation to the emergency room, pulse oximetry was 96% on 4 L of O2 by nasal cannula and heart rate of 123 with otherwise normal vital signs.  Labs revealed unremarkable CMP and lactic acid was 1.7 and procalcitonin was 1.24.  Influenza antigens and COVID-19 PCR came back negative.  CBC showed leukocytosis 17.3 with hemoconcentration. the patient underwent arthrocentesis of her left knee and synovial fluid analysis showed 34,207 with 81 neutrophils and was turbid.  2 blood cultures were drawn. Two-view chest x-ray showed new large right upper lobe pneumonia.  Left knee x-ray showed small joint effusion without acute bony abnormality.  The patient was given IV Rocephin and Zithromax as well as vancomycin and 1 L bolus of IV lactated Ringer.  She will be admitted to the medical bed for further  evaluation and management. PAST MEDICAL HISTORY:   Past Medical History:  Diagnosis Date  . Anxiety   . Asthma   . Chronic pain    lower back  . Chronic systolic CHF (congestive heart failure) (HCC) 09/22/2019  . COPD (chronic obstructive pulmonary disease) (HCC)   . Depression   . DJD (degenerative joint disease)   . Fibromyalgia   . GERD (gastroesophageal reflux disease)   . Headache   . Hyperlipidemia   . Lower extremity edema   . On home oxygen therapy    has not been on since 2016  . Panic attack   . Shortness of breath dyspnea   . Sleep disorder     PAST SURGICAL HISTORY:   Past Surgical History:  Procedure Laterality Date  . ABDOMINAL SURGERY    . ANKLE SURGERY    . BACK SURGERY     lower  . CARPAL TUNNEL RELEASE    . CESAREAN SECTION    . KNEE SURGERY    . TONSILLECTOMY    . TOTAL KNEE ARTHROPLASTY Left 04/14/2016   Procedure: TOTAL KNEE ARTHROPLASTY;  Surgeon: Deeann Saint, MD;  Location: [facility] ORS;  Service: Orthopedics;  Laterality: Left;  . TUBAL LIGATION      SOCIAL HISTORY:   Social History   Tobacco Use  . Smoking status: Current Every Day Smoker    Packs/day: 1.00    Years: 45.00    Pack years: 45.00    Types: Cigarettes  . Smokeless tobacco:  Never Used  Substance Use Topics  . Alcohol use: Yes    Alcohol/week: 0.0 standard drinks    Comment: rare    FAMILY HISTORY:   Family History  Problem Relation Age of Onset  . Cancer Mother        beast    DRUG ALLERGIES:   Allergies  Allergen Reactions  . Gabapentin Shortness Of Breath and Rash  . Mobic [Meloxicam] Anaphylaxis  . Penicillins Shortness Of Breath and Swelling    Has patient had a PCN reaction causing immediate rash, facial/tongue/throat swelling, SOB or lightheadedness with hypotension: unknown Has patient had a PCN reaction causing severe rash involving mucus membranes or skin necrosis: {unknown Has patient had a PCN reaction that required hospitalization {unknown Has  patient had a PCN reaction occurring within the last 10 years: no If all of the above answers are "NO", then may proceed with Cephalosporin use.  . Sulfa Antibiotics Shortness Of Breath and Swelling    REVIEW OF SYSTEMS:   ROS As per history of present illness. All pertinent systems were reviewed above. Constitutional, HEENT, cardiovascular, respiratory, GI, GU, musculoskeletal, neuro, psychiatric, endocrine, integumentary and hematologic systems were reviewed and are otherwise negative/unremarkable except for positive findings mentioned above in the HPI.   MEDICATIONS AT HOME:   Prior to Admission medications   Medication Sig Start Date End Date Taking? Authorizing Provider  acetaminophen (TYLENOL) 500 MG tablet Take 2 tablets (1,000 mg total) by mouth every 12 (twelve) hours as needed for mild pain or moderate pain. 10/01/19 09/30/20  Edward Jolly, MD  albuterol (PROVENTIL) (2.5 MG/3ML) 0.083% nebulizer solution TAKE 3 ML BY NEBULIZATION Q 6 hrs prn FOR WHEEZING OR SOB Dx J44.9 10/06/17   Enedina Finner, MD  albuterol (VENTOLIN HFA) 108 (90 Base) MCG/ACT inhaler INHALE 2 PUFFS BY MOUTH EVERY 6 HOURS AS NEEDED FOR WHEEZE 10/20/18   [provider]  ALPRAZolam Prudy Feeler) 0.5 MG tablet Take 0.5 mg by mouth 2 (two) times daily as needed. 07/25/19   [provider]  atorvastatin (LIPITOR) 20 MG tablet TAKE 1 TABLET (20 MG TOTAL) BY MOUTH DAILY. 04/29/16   Daphine Deutscher, Mary-Margaret, FNP  buprenorphine (BUTRANS) 20 MCG/HR PTWK Place 1 patch onto the skin every 7 (seven) days. 04/22/20 07/15/20  Edward Jolly, MD  busPIRone (BUSPAR) 5 MG tablet Take 5 mg by mouth 3 (three) times daily.     [provider]  Fluticasone-Umeclidin-Vilant (TRELEGY ELLIPTA) 100-62.5-25 MCG/INH AEPB Inhale 1 puff into the lungs daily. 07/11/19   [provider]  furosemide (LASIX) 40 MG tablet Take 40 mg by mouth daily. 07/11/19   [provider]  guaiFENesin-dextromethorphan (ROBITUSSIN DM)  100-10 MG/5ML syrup Take 5 mLs by mouth every 4 (four) hours as needed for cough. 06/13/19   Delfino Lovett, MD  ondansetron (ZOFRAN) 4 MG tablet Take 4 mg by mouth as needed for nausea/vomiting. 09/04/19   [provider]  phenol (CHLORASEPTIC) 1.4 % LIQD Use as directed 1 spray in the mouth or throat as needed for throat irritation / pain. 06/13/19   Delfino Lovett, MD  sertraline (ZOLOFT) 50 MG tablet Take 1 tablet (50 mg total) by mouth daily. 10/08/17   Enedina Finner, MD  tiZANidine (ZANAFLEX) 4 MG tablet Take 1.5 tablets (6 mg total) by mouth 2 (two) times daily as needed for muscle spasms. 04/22/20 07/21/20  Edward Jolly, MD  topiramate (TOPAMAX) 25 MG tablet Take 25 mg by mouth 2 (two) times daily. 08/02/19   [provider]  VITAL SIGNS:  Blood pressure (!) 122/91, pulse (!) 102, temperature 98.6 F (37 C), temperature source Oral, resp. rate 20, height 5\' 3"  (1.6 m), weight 77.1 kg, SpO2 94 %.  PHYSICAL EXAMINATION:  Physical Exam  GENERAL:  64 y.o.-year-old Caucasian female patient lying in the bed with mild respiratory distress with conversational dyspnea. EYES: Pupils equal, round, reactive to light and accommodation. No scleral icterus. Extraocular muscles intact.  HEENT: Head atraumatic, normocephalic. Oropharynx and nasopharynx clear.  NECK:  Supple, no jugular venous distention. No thyroid enlargement, no tenderness.  LUNGS: Right midlung zone and upper lung zone crackles with diminished breath sounds.  She has diffuse expiratory wheezes and rhonchi with harsh vesicular breathing and tight expiratory airflow. CARDIOVASCULAR: Regular rate and rhythm, S1, S2 normal. No murmurs, rubs, or gallops.  ABDOMEN: Soft, nondistended, nontender. Bowel sounds present. No organomegaly or mass.  EXTREMITIES: No pedal edema, cyanosis, or clubbing.  NEUROLOGIC: Cranial nerves II through XII are intact. Muscle strength 5/5 in all extremities. Sensation intact. Gait not  checked. Musculoskeletal: Left knee diffuse swelling with estimated mild warmth and tenderness with decreased range of motion secondary to pain. PSYCHIATRIC: The patient is alert and oriented x 3.  Normal affect and good eye contact. SKIN: As above otherwise no obvious rash, lesion, or ulcer.     LABORATORY PANEL:   CBC Recent Labs  Lab 06/10/20 1818  WBC 17.3*  HGB 15.1*  HCT 47.6*  PLT 203   ------------------------------------------------------------------------------------------------------------------  Chemistries  Recent Labs  Lab 06/10/20 1929  NA 136  K 3.7  CL 96*  CO2 22  GLUCOSE 121*  BUN 20  CREATININE 0.80  CALCIUM 8.8*   ------------------------------------------------------------------------------------------------------------------  Cardiac Enzymes No results for input(s): TROPONINI in the last 168 hours. ------------------------------------------------------------------------------------------------------------------  RADIOLOGY:  DG Chest 2 View  Result Date: 06/10/2020 CLINICAL DATA:  Wheezing. EXAM: CHEST - 2 VIEW COMPARISON:  October 08, 2019. FINDINGS: The heart size and mediastinal contours are within normal limits. No pneumothorax or pleural effusion is noted. Left lung is clear. New large right upper lobe airspace opacity is noted consistent pneumonia. The visualized skeletal structures are unremarkable. IMPRESSION: New large right upper lobe pneumonia. Electronically Signed   By: Lupita RaiderJames  Green Jr M.D.   On: 06/10/2020 19:43   DG Knee 2 Views Left  Result Date: 06/10/2020 CLINICAL DATA:  Knee pain EXAM: LEFT KNEE - 2 VIEW COMPARISON:  05/16/2018 FINDINGS: Left knee prosthesis is again seen and stable. No dislocation or acute fracture is noted. Small joint effusion is seen. No other focal abnormality is noted. IMPRESSION: Small joint effusion without acute bony abnormality. Electronically Signed   By: Alcide CleverMark  Lukens M.D.   On: 06/10/2020 18:51       IMPRESSION AND PLAN:   1.  Right upper lobe community-acquired pneumonia with subsequent sepsis without severe sepsis, as manifested by tachycardia of 123 and leukocytosis of 17.3 with associated elevated procalcitonin. -The patient will be admitted to a medical bed. -Order protocol will be followed. -We will continue antibiotic therapy with IV Rocephin and Zithromax. -Mucolytic therapy and bronchodilator therapy will be provided. -We will follow sputum and blood cultures.  2.  Left knee nonpurulent cellulitis.  She could be having early septic arthritis still heard WBCs are only 34,000 on her synovial fluid analysis.  The patient is status post left total knee arthroplasty. -The patient will be placed on antibiotic therapy with IV Rocephin and add IV vancomycin being a MRSA risk. -Orthopedic consultation will be obtained. -  She will be kept n.p.o. -I notified Dr. Joice Lofts about the patient. -Pain management will be provided.  3.  COPD/asthma acute exacerbation. -The patient will be placed on IV steroid therapy as well as mucolytic therapy and bronchodilator therapy in addition to IV antibiotics mentioned above. -We will hold Trelegy for now.  4.  Depression and anxiety. -We will continue Zoloft, Topamax and trazodone as well as BuSpar and Xanax..  5.  Dyslipidemia. -We will continue statin therapy.  6.  DVT prophylaxis. -Subcutaneous Lovenox   All the records are reviewed and case discussed with ED provider. The plan of care was discussed in details with the patient (and family). I answered all questions. The patient agreed to proceed with the above mentioned plan. Further management will depend upon hospital course.   CODE STATUS: Full code  Status is: Inpatient  Remains inpatient appropriate because:Hemodynamically unstable, Ongoing diagnostic testing needed not appropriate for outpatient work up, Unsafe d/c plan, IV treatments appropriate due to intensity of illness  or inability to take PO and Inpatient level of care appropriate due to severity of illness   Dispo: The patient is from: Home              Anticipated d/c is to: Home              Anticipated d/c date is: 3 days              Patient currently is not medically stable to d/c.    TOTAL TIME TAKING CARE OF THIS PATIENT: 55 minutes.    Hannah Beat M.D on 06/10/2020 at 11:52 PM  [facility]   From 7 PM-7 AM, contact night-coverage [email or web address]  CC: Primary care physician; [name], MD

## 2020-06-10 NOTE — ED Triage Notes (Signed)
Patient to ER via [facility] for c/o L knee pain. Patient has h/o knee replacement to same knee and has been unable to stand on knee. Upon EMS arrival to home, patient had increased work of breathing with audible rhonchi and wheezing, diminished breath sounds on right side, and respirations of 32/min. Patient was given 5mg  Albuterol neb and 125mg  Solumedrol via IV (20G right hand). Patient wears 2L O2 at all times at home, arrives on 4L. Sats were 92% on 2L. Patient has h/o COPD, Asthma, and smoking. Per EMS, lung sounds have improved, but rhonchi remains in all lobes. 12 lead unremarkable.

## 2020-06-10 NOTE — ED Triage Notes (Signed)
Pt here with left knee pain and SOB. Pt origanlly called ems for knee pain but ems noticed that she was SOB ads well. Pt has hx of COPD and normally wears 2L. Pt NAD in triage.

## 2020-06-11 ENCOUNTER — Inpatient Hospital Stay: Payer: Medicare Other | Admitting: Certified Registered Nurse Anesthetist

## 2020-06-11 ENCOUNTER — Inpatient Hospital Stay: Payer: Medicare Other

## 2020-06-11 ENCOUNTER — Encounter
Admission: EM | Disposition: A | Discharge status: Skilled Nursing Facility | Payer: Self-pay | Source: Home / Self Care | Attending: Hospitalist

## 2020-06-11 ENCOUNTER — Encounter: Payer: Self-pay | Admitting: Family Medicine

## 2020-06-11 DIAGNOSIS — R7881 Bacteremia: Secondary | ICD-10-CM

## 2020-06-11 DIAGNOSIS — J189 Pneumonia, unspecified organism: Secondary | ICD-10-CM | POA: Diagnosis not present

## 2020-06-11 DIAGNOSIS — T8450XA Infection and inflammatory reaction due to unspecified internal joint prosthesis, initial encounter: Secondary | ICD-10-CM | POA: Diagnosis not present

## 2020-06-11 DIAGNOSIS — B953 Streptococcus pneumoniae as the cause of diseases classified elsewhere: Secondary | ICD-10-CM

## 2020-06-11 HISTORY — PX: I & D KNEE WITH POLY EXCHANGE: SHX5024

## 2020-06-11 LAB — CBC WITH DIFFERENTIAL/PLATELET
Abs Immature Granulocytes: 0.23 10*3/uL — ABNORMAL HIGH (ref 0.00–0.07)
Basophils Absolute: 0 10*3/uL (ref 0.0–0.1)
Basophils Relative: 0 %
Eosinophils Absolute: 0 10*3/uL (ref 0.0–0.5)
Eosinophils Relative: 0 %
HCT: 42.4 % (ref 36.0–46.0)
Hemoglobin: 13.5 g/dL (ref 12.0–15.0)
Immature Granulocytes: 2 %
Lymphocytes Relative: 8 %
Lymphs Abs: 1.1 10*3/uL (ref 0.7–4.0)
MCH: 28.6 pg (ref 26.0–34.0)
MCHC: 31.8 g/dL (ref 30.0–36.0)
MCV: 89.8 fL (ref 80.0–100.0)
Monocytes Absolute: 0.5 10*3/uL (ref 0.1–1.0)
Monocytes Relative: 4 %
Neutro Abs: 11.5 10*3/uL — ABNORMAL HIGH (ref 1.7–7.7)
Neutrophils Relative %: 86 %
Platelets: 195 10*3/uL (ref 150–400)
RBC: 4.72 MIL/uL (ref 3.87–5.11)
RDW: 13.9 % (ref 11.5–15.5)
Smear Review: NORMAL
WBC: 13.4 10*3/uL — ABNORMAL HIGH (ref 4.0–10.5)
nRBC: 0 % (ref 0.0–0.2)

## 2020-06-11 LAB — COMPREHENSIVE METABOLIC PANEL
ALT: 23 U/L (ref 0–44)
AST: 32 U/L (ref 15–41)
Albumin: 2.4 g/dL — ABNORMAL LOW (ref 3.5–5.0)
Alkaline Phosphatase: 117 U/L (ref 38–126)
Anion gap: 11 (ref 5–15)
BUN: 22 mg/dL (ref 8–23)
CO2: 27 mmol/L (ref 22–32)
Calcium: 8.5 mg/dL — ABNORMAL LOW (ref 8.9–10.3)
Chloride: 98 mmol/L (ref 98–111)
Creatinine, Ser: 0.7 mg/dL (ref 0.44–1.00)
GFR, Estimated: >60 mL/min (ref >60)
Glucose, Bld: 144 mg/dL — ABNORMAL HIGH (ref 70–99)
Potassium: 3.6 mmol/L (ref 3.5–5.1)
Sodium: 136 mmol/L (ref 135–145)
Total Bilirubin: 1.3 mg/dL — ABNORMAL HIGH (ref 0.3–1.2)
Total Protein: 6.6 g/dL (ref 6.5–8.1)

## 2020-06-11 LAB — PROTIME-INR
INR: 1.4 — ABNORMAL HIGH (ref 0.8–1.2)
Prothrombin Time: 16.7 seconds — ABNORMAL HIGH (ref 11.4–15.2)

## 2020-06-11 LAB — BLOOD CULTURE ID PANEL (REFLEXED) - BCID2

## 2020-06-11 LAB — SYNOVIAL CELL COUNT + DIFF, W/ CRYSTALS
Crystals, Fluid: NONE SEEN
Eosinophils-Synovial: 0 %
Lymphocytes-Synovial Fld: 19 %
Monocyte-Macrophage-Synovial Fluid: 0 %
Neutrophil, Synovial: 81 %
Other Cells-SYN: 0
WBC, Synovial: 34207 /mm3 — ABNORMAL HIGH (ref 0–200)

## 2020-06-11 LAB — APTT: aPTT: 32 seconds (ref 24–36)

## 2020-06-11 LAB — HIV ANTIBODY (ROUTINE TESTING W REFLEX): HIV Screen 4th Generation wRfx: NONREACTIVE

## 2020-06-11 SURGERY — IRRIGATION AND DEBRIDEMENT KNEE WITH POLY EXCHANGE
Anesthesia: General | Site: Knee

## 2020-06-11 MED ORDER — VANCOMYCIN HCL IN DEXTROSE 1-5 GM/200ML-% IV SOLN: 1000.0000 mg | Freq: Once | INTRAVENOUS | Status: DC

## 2020-06-11 MED ORDER — METOPROLOL TARTRATE 5 MG/5ML IV SOLN
5.0000 mg | Freq: Once | INTRAVENOUS | Status: AC
  Administered 2020-06-11: 5 mg via INTRAVENOUS

## 2020-06-11 MED ORDER — KETAMINE HCL 50 MG/ML IJ SOLN
INTRAMUSCULAR | Status: AC
  Filled 2020-06-11: qty 10

## 2020-06-11 MED ORDER — SODIUM CHLORIDE 0.9 % IV SOLN: INTRAVENOUS | Status: DC

## 2020-06-11 MED ORDER — FENTANYL CITRATE (PF) 100 MCG/2ML IJ SOLN
INTRAMUSCULAR | Status: AC
  Filled 2020-06-11: qty 2

## 2020-06-11 MED ORDER — VANCOMYCIN HCL IN DEXTROSE 1-5 GM/200ML-% IV SOLN
1000.0000 mg | Freq: Once | INTRAVENOUS | Status: DC
  Filled 2020-06-11: qty 200

## 2020-06-11 MED ORDER — GLYCOPYRROLATE 0.2 MG/ML IJ SOLN
INTRAMUSCULAR | Status: DC | PRN
  Administered 2020-06-11: .2 mg via INTRAVENOUS

## 2020-06-11 MED ORDER — VANCOMYCIN HCL 1000 MG IV SOLR
INTRAVENOUS | Status: AC
  Filled 2020-06-11: qty 1000

## 2020-06-11 MED ORDER — ONDANSETRON HCL 4 MG/2ML IJ SOLN
INTRAMUSCULAR | Status: AC
  Filled 2020-06-11: qty 2

## 2020-06-11 MED ORDER — TRAMADOL HCL 50 MG PO TABS
50.0000 mg | ORAL_TABLET | Freq: Four times a day (QID) | ORAL | Status: DC | PRN
  Administered 2020-06-12: 50 mg via ORAL
  Filled 2020-06-11: qty 1

## 2020-06-11 MED ORDER — DEXMEDETOMIDINE HCL 200 MCG/2ML IV SOLN
INTRAVENOUS | Status: DC | PRN
  Administered 2020-06-11: 4 ug via INTRAVENOUS
  Administered 2020-06-11: 8 ug via INTRAVENOUS
  Administered 2020-06-11 (×2): 4 ug via INTRAVENOUS

## 2020-06-11 MED ORDER — OXYCODONE HCL 5 MG PO TABS
5.0000 mg | ORAL_TABLET | ORAL | Status: DC | PRN
  Administered 2020-06-11 – 2020-06-12 (×5): 10 mg via ORAL
  Administered 2020-06-13 (×2): 5 mg via ORAL
  Administered 2020-06-13: 10 mg via ORAL
  Administered 2020-06-14: 5 mg via ORAL
  Administered 2020-06-14: 10 mg via ORAL
  Filled 2020-06-11: qty 2
  Filled 2020-06-11: qty 1
  Filled 2020-06-11 (×3): qty 2
  Filled 2020-06-11: qty 1
  Filled 2020-06-11 (×3): qty 2
  Filled 2020-06-11: qty 1

## 2020-06-11 MED ORDER — FENTANYL CITRATE (PF) 100 MCG/2ML IJ SOLN: 25.0000 ug | INTRAMUSCULAR | Status: DC | PRN

## 2020-06-11 MED ORDER — HYDROMORPHONE HCL 1 MG/ML IJ SOLN
INTRAMUSCULAR | Status: DC | PRN
  Administered 2020-06-11 (×2): .5 mg via INTRAVENOUS

## 2020-06-11 MED ORDER — BISACODYL 10 MG RE SUPP: 10.0000 mg | Freq: Every day | RECTAL | Status: DC | PRN

## 2020-06-11 MED ORDER — HYDROMORPHONE HCL 1 MG/ML IJ SOLN
INTRAMUSCULAR | Status: AC
  Filled 2020-06-11: qty 1

## 2020-06-11 MED ORDER — HYDROMORPHONE HCL 1 MG/ML IJ SOLN: 0.2500 mg | INTRAMUSCULAR | Status: DC | PRN

## 2020-06-11 MED ORDER — CEFAZOLIN SODIUM-DEXTROSE 1-4 GM/50ML-% IV SOLN
INTRAVENOUS | Status: DC | PRN
  Administered 2020-06-11: 1 g via INTRAVENOUS

## 2020-06-11 MED ORDER — IPRATROPIUM-ALBUTEROL 0.5-2.5 (3) MG/3ML IN SOLN
RESPIRATORY_TRACT | Status: AC
  Administered 2020-06-11: 3 mL via RESPIRATORY_TRACT
  Filled 2020-06-11: qty 3

## 2020-06-11 MED ORDER — ACETAMINOPHEN 325 MG PO TABS
325.0000 mg | ORAL_TABLET | Freq: Four times a day (QID) | ORAL | Status: DC | PRN
  Administered 2020-06-14: 650 mg via ORAL
  Filled 2020-06-11 (×2): qty 2

## 2020-06-11 MED ORDER — ENOXAPARIN SODIUM 40 MG/0.4ML ~~LOC~~ SOLN
40.0000 mg | SUBCUTANEOUS | Status: DC
  Administered 2020-06-12 – 2020-06-16 (×5): 40 mg via SUBCUTANEOUS
  Filled 2020-06-11 (×5): qty 0.4

## 2020-06-11 MED ORDER — PREDNISONE 20 MG PO TABS
40.0000 mg | ORAL_TABLET | Freq: Every day | ORAL | Status: AC
  Administered 2020-06-12 – 2020-06-15 (×4): 40 mg via ORAL
  Filled 2020-06-11 (×4): qty 2

## 2020-06-11 MED ORDER — LIDOCAINE HCL (CARDIAC) PF 100 MG/5ML IV SOSY
PREFILLED_SYRINGE | INTRAVENOUS | Status: DC | PRN
  Administered 2020-06-11: 80 mg via INTRAVENOUS

## 2020-06-11 MED ORDER — ESMOLOL HCL 100 MG/10ML IV SOLN
INTRAVENOUS | Status: AC
  Filled 2020-06-11: qty 10

## 2020-06-11 MED ORDER — METOCLOPRAMIDE HCL 5 MG/ML IJ SOLN: 5.0000 mg | Freq: Three times a day (TID) | INTRAMUSCULAR | Status: DC | PRN

## 2020-06-11 MED ORDER — ESMOLOL HCL 100 MG/10ML IV SOLN
INTRAVENOUS | Status: DC | PRN
  Administered 2020-06-11: 20 mg via INTRAVENOUS
  Administered 2020-06-11: 10 mg via INTRAVENOUS
  Administered 2020-06-11 (×2): 20 mg via INTRAVENOUS
  Administered 2020-06-11: 10 mg via INTRAVENOUS
  Administered 2020-06-11: 20 mg via INTRAVENOUS

## 2020-06-11 MED ORDER — ONDANSETRON HCL 4 MG/2ML IJ SOLN
4.0000 mg | Freq: Four times a day (QID) | INTRAMUSCULAR | Status: DC | PRN
  Administered 2020-06-15: 4 mg via INTRAVENOUS
  Filled 2020-06-11: qty 2

## 2020-06-11 MED ORDER — PROPOFOL 10 MG/ML IV BOLUS
INTRAVENOUS | Status: DC | PRN
  Administered 2020-06-11: 150 mg via INTRAVENOUS

## 2020-06-11 MED ORDER — IPRATROPIUM-ALBUTEROL 0.5-2.5 (3) MG/3ML IN SOLN: 3.0000 mL | Freq: Once | RESPIRATORY_TRACT | Status: AC

## 2020-06-11 MED ORDER — OXYCODONE HCL 5 MG/5ML PO SOLN: 5.0000 mg | Freq: Once | ORAL | Status: DC | PRN

## 2020-06-11 MED ORDER — ONDANSETRON HCL 4 MG/2ML IJ SOLN
INTRAMUSCULAR | Status: DC | PRN
  Administered 2020-06-11: 4 mg via INTRAVENOUS

## 2020-06-11 MED ORDER — ACETAMINOPHEN 10 MG/ML IV SOLN
INTRAVENOUS | Status: DC | PRN
  Administered 2020-06-11: 1000 mg via INTRAVENOUS

## 2020-06-11 MED ORDER — ONDANSETRON HCL 4 MG PO TABS
4.0000 mg | ORAL_TABLET | Freq: Four times a day (QID) | ORAL | Status: DC | PRN
  Administered 2020-06-13: 4 mg via ORAL
  Filled 2020-06-11 (×2): qty 1

## 2020-06-11 MED ORDER — MAGNESIUM HYDROXIDE 400 MG/5ML PO SUSP
30.0000 mL | Freq: Every day | ORAL | Status: DC | PRN
  Administered 2020-06-12: 30 mL via ORAL

## 2020-06-11 MED ORDER — TRANEXAMIC ACID 1000 MG/10ML IV SOLN
INTRAVENOUS | Status: AC
  Filled 2020-06-11: qty 10

## 2020-06-11 MED ORDER — ONDANSETRON HCL 4 MG/2ML IJ SOLN: 4.0000 mg | Freq: Once | INTRAMUSCULAR | Status: DC | PRN

## 2020-06-11 MED ORDER — BUPRENORPHINE 10 MCG/HR TD PTWK
2.0000 | MEDICATED_PATCH | TRANSDERMAL | Status: DC
  Administered 2020-06-12: 2 via TRANSDERMAL
  Filled 2020-06-11 (×2): qty 2

## 2020-06-11 MED ORDER — METOCLOPRAMIDE HCL 10 MG PO TABS: 5.0000 mg | ORAL_TABLET | Freq: Three times a day (TID) | ORAL | Status: DC | PRN

## 2020-06-11 MED ORDER — SODIUM CHLORIDE 0.9 % IV SOLN
2.0000 g | INTRAVENOUS | Status: DC
  Administered 2020-06-12 – 2020-06-17 (×6): 2 g via INTRAVENOUS
  Filled 2020-06-11 (×3): qty 20
  Filled 2020-06-11: qty 2
  Filled 2020-06-11: qty 20
  Filled 2020-06-11: qty 2
  Filled 2020-06-11 (×3): qty 20

## 2020-06-11 MED ORDER — MIDAZOLAM HCL 2 MG/2ML IJ SOLN
INTRAMUSCULAR | Status: AC
  Filled 2020-06-11: qty 2

## 2020-06-11 MED ORDER — DOCUSATE SODIUM 100 MG PO CAPS
100.0000 mg | ORAL_CAPSULE | Freq: Two times a day (BID) | ORAL | Status: DC
  Administered 2020-06-11 – 2020-06-17 (×12): 100 mg via ORAL
  Filled 2020-06-11 (×12): qty 1

## 2020-06-11 MED ORDER — FENTANYL CITRATE (PF) 100 MCG/2ML IJ SOLN
INTRAMUSCULAR | Status: DC | PRN
Start: 2020-06-11 — End: 2020-06-11
  Administered 2020-06-11: 25 ug via INTRAVENOUS
  Administered 2020-06-11: 50 ug via INTRAVENOUS
  Administered 2020-06-11: 25 ug via INTRAVENOUS
  Administered 2020-06-11: 50 ug via INTRAVENOUS

## 2020-06-11 MED ORDER — MIDAZOLAM HCL 2 MG/2ML IJ SOLN
INTRAMUSCULAR | Status: DC | PRN
  Administered 2020-06-11: 1 mg via INTRAVENOUS

## 2020-06-11 MED ORDER — ACETAMINOPHEN 500 MG PO TABS
1000.0000 mg | ORAL_TABLET | Freq: Four times a day (QID) | ORAL | Status: AC
  Administered 2020-06-11 – 2020-06-12 (×4): 1000 mg via ORAL
  Filled 2020-06-11 (×4): qty 2

## 2020-06-11 MED ORDER — DEXAMETHASONE SODIUM PHOSPHATE 10 MG/ML IJ SOLN
INTRAMUSCULAR | Status: DC | PRN
  Administered 2020-06-11: 10 mg via INTRAVENOUS

## 2020-06-11 MED ORDER — MORPHINE SULFATE (PF) 2 MG/ML IV SOLN
2.0000 mg | INTRAVENOUS | Status: DC | PRN
  Administered 2020-06-11: 2 mg via INTRAVENOUS
  Filled 2020-06-11: qty 1

## 2020-06-11 MED ORDER — SODIUM CHLORIDE 0.9 % IV SOLN: 2.0000 g | INTRAVENOUS | Status: DC

## 2020-06-11 MED ORDER — LIDOCAINE HCL (PF) 2 % IJ SOLN
INTRAMUSCULAR | Status: AC
  Filled 2020-06-11: qty 10

## 2020-06-11 MED ORDER — OXYCODONE HCL 5 MG PO TABS: 5.0000 mg | ORAL_TABLET | Freq: Once | ORAL | Status: DC | PRN

## 2020-06-11 MED ORDER — FLEET ENEMA 7-19 GM/118ML RE ENEM: 1.0000 | ENEMA | Freq: Once | RECTAL | Status: DC | PRN

## 2020-06-11 MED ORDER — DEXAMETHASONE SODIUM PHOSPHATE 10 MG/ML IJ SOLN
INTRAMUSCULAR | Status: AC
  Filled 2020-06-11: qty 1

## 2020-06-11 MED ORDER — GLYCOPYRROLATE 0.2 MG/ML IJ SOLN
INTRAMUSCULAR | Status: AC
  Filled 2020-06-11: qty 1

## 2020-06-11 MED ORDER — METHYLPREDNISOLONE SODIUM SUCC 40 MG IJ SOLR
40.0000 mg | Freq: Three times a day (TID) | INTRAMUSCULAR | Status: AC
  Administered 2020-06-11 (×2): 40 mg via INTRAVENOUS
  Filled 2020-06-11 (×2): qty 1

## 2020-06-11 MED ORDER — VANCOMYCIN HCL IN DEXTROSE 1-5 GM/200ML-% IV SOLN
1000.0000 mg | Freq: Two times a day (BID) | INTRAVENOUS | Status: DC
  Administered 2020-06-11 – 2020-06-13 (×4): 1000 mg via INTRAVENOUS
  Filled 2020-06-11 (×9): qty 200

## 2020-06-11 MED ORDER — DEXMEDETOMIDINE (PRECEDEX) IN NS 20 MCG/5ML (4 MCG/ML) IV SYRINGE
PREFILLED_SYRINGE | INTRAVENOUS | Status: AC
  Filled 2020-06-11: qty 5

## 2020-06-11 MED ORDER — METOPROLOL TARTRATE 5 MG/5ML IV SOLN
INTRAVENOUS | Status: AC
  Filled 2020-06-11: qty 5

## 2020-06-11 MED ORDER — ACETAMINOPHEN 10 MG/ML IV SOLN
INTRAVENOUS | Status: AC
  Filled 2020-06-11: qty 100

## 2020-06-11 MED ORDER — PROPOFOL 10 MG/ML IV BOLUS
INTRAVENOUS | Status: AC
  Filled 2020-06-11: qty 20

## 2020-06-11 MED ORDER — KETAMINE HCL 50 MG/ML IJ SOLN
INTRAMUSCULAR | Status: DC | PRN
  Administered 2020-06-11: 50 mg via INTRAMUSCULAR

## 2020-06-11 MED ORDER — DIPHENHYDRAMINE HCL 12.5 MG/5ML PO ELIX: 12.5000 mg | ORAL_SOLUTION | ORAL | Status: DC | PRN

## 2020-06-11 MED ORDER — TRANEXAMIC ACID 1000 MG/10ML IV SOLN
INTRAVENOUS | Status: DC | PRN
  Administered 2020-06-11: 1000 mg via TOPICAL

## 2020-06-11 SURGICAL SUPPLY — 86 items
BLADE SAW SAG 25X90X1.19 (BLADE) ×3 IMPLANT
BLADE SURG 10 STRL SS SAFETY (BLADE) ×6 IMPLANT
BLADE SURG SZ10 CARB STEEL (BLADE) ×3 IMPLANT
BNDG COHESIVE 4X5 TAN STRL (GAUZE/BANDAGES/DRESSINGS) ×3 IMPLANT
BNDG COHESIVE 6X5 TAN STRL LF (GAUZE/BANDAGES/DRESSINGS) ×3 IMPLANT
BNDG ELASTIC 6X5.8 VLCR STR LF (GAUZE/BANDAGES/DRESSINGS) ×3 IMPLANT
BNDG ESMARK 4X12 TAN STRL LF (GAUZE/BANDAGES/DRESSINGS) ×3 IMPLANT
CANISTER SUCT 1200ML W/VALVE (MISCELLANEOUS) ×6 IMPLANT
CANISTER SUCT 3000ML PPV (MISCELLANEOUS) ×6 IMPLANT
CEMENT VACUUM MIXING SYSTEM (MISCELLANEOUS) ×3 IMPLANT
CHLORAPREP W/TINT 26 (MISCELLANEOUS) ×6 IMPLANT
COLLAGEN CELLERATERX 1 GRAM (Miscellaneous) ×6 IMPLANT
COOLER POLAR GLACIER W/PUMP (MISCELLANEOUS) ×3 IMPLANT
COVER WAND RF STERILE (DRAPES) ×6 IMPLANT
CUFF TOURN SGL QUICK 18X4 (TOURNIQUET CUFF) IMPLANT
CUFF TOURN SGL QUICK 24 (TOURNIQUET CUFF)
CUFF TOURN SGL QUICK 30 (TOURNIQUET CUFF) ×2
CUFF TRNQT CYL 24X4X16.5-23 (TOURNIQUET CUFF) IMPLANT
CUFF TRNQT CYL 30X4X21-28X (TOURNIQUET CUFF) ×1 IMPLANT
DRAPE SURG 17X11 SM STRL (DRAPES) ×6 IMPLANT
DRSG MEPILEX SACRM 8.7X9.8 (GAUZE/BANDAGES/DRESSINGS) IMPLANT
DRSG OPSITE POSTOP 4X10 (GAUZE/BANDAGES/DRESSINGS) ×3 IMPLANT
DRSG OPSITE POSTOP 4X14 (GAUZE/BANDAGES/DRESSINGS) IMPLANT
DRSG TEGADERM 2-3/8X2-3/4 SM (GAUZE/BANDAGES/DRESSINGS) ×3 IMPLANT
ELECT CAUTERY BLADE 6.4 (BLADE) IMPLANT
ELECT REM PT RETURN 9FT ADLT (ELECTROSURGICAL) ×6
ELECTRODE REM PT RTRN 9FT ADLT (ELECTROSURGICAL) ×2 IMPLANT
GAUZE SPONGE 4X4 12PLY STRL (GAUZE/BANDAGES/DRESSINGS) ×3 IMPLANT
GAUZE XEROFORM 1X8 LF (GAUZE/BANDAGES/DRESSINGS) ×3 IMPLANT
GLOVE BIO SURGEON STRL SZ7 (GLOVE) ×6 IMPLANT
GLOVE BIO SURGEON STRL SZ7.5 (GLOVE) ×21 IMPLANT
GLOVE BIO SURGEON STRL SZ8 (GLOVE) ×18 IMPLANT
GLOVE BIOGEL PI IND STRL 8 (GLOVE) ×2 IMPLANT
GLOVE BIOGEL PI INDICATOR 8 (GLOVE) ×4
GLOVE INDICATOR 8.0 STRL GRN (GLOVE) ×6 IMPLANT
GLOVE SURG ORTHO 8.5 STRL (GLOVE) ×3 IMPLANT
GOWN STRL REUS W/ TWL LRG LVL3 (GOWN DISPOSABLE) ×2 IMPLANT
GOWN STRL REUS W/ TWL XL LVL3 (GOWN DISPOSABLE) ×2 IMPLANT
GOWN STRL REUS W/TWL LRG LVL3 (GOWN DISPOSABLE) ×4
GOWN STRL REUS W/TWL XL LVL3 (GOWN DISPOSABLE) ×4
HANDLE YANKAUER SUCT BULB TIP (MISCELLANEOUS) ×3 IMPLANT
HOOD PEEL AWAY FLYTE STAYCOOL (MISCELLANEOUS) ×12 IMPLANT
IMMBOLIZER KNEE 19 BLUE UNIV (SOFTGOODS) ×3 IMPLANT
INSERT LCS 3 PEG STD (Knees) ×3 IMPLANT
INSERT TIB LCS RP STD+ 10 (Knees) ×3 IMPLANT
KIT STIMULAN RAPID CURE 5CC (Orthopedic Implant) ×3 IMPLANT
KIT TURNOVER KIT A (KITS) ×6 IMPLANT
LABEL OR SOLS (LABEL) ×3 IMPLANT
NDL SAFETY ECLIPSE 18X1.5 (NEEDLE) ×1 IMPLANT
NEEDLE HYPO 18GX1.5 SHARP (NEEDLE) ×2
NEEDLE SPNL 20GX3.5 QUINCKE YW (NEEDLE) ×3 IMPLANT
NS IRRIG 1000ML POUR BTL (IV SOLUTION) ×3 IMPLANT
NS IRRIG 500ML POUR BTL (IV SOLUTION) ×3 IMPLANT
PACK EXTREMITY (MISCELLANEOUS) ×3 IMPLANT
PACK TOTAL KNEE (MISCELLANEOUS) ×3 IMPLANT
PAD CAST CTTN 4X4 STRL (SOFTGOODS) ×1 IMPLANT
PAD WRAPON POLAR KNEE (MISCELLANEOUS) ×1 IMPLANT
PADDING CAST COTTON 4X4 STRL (SOFTGOODS) ×2
PENCIL SMOKE EVACUATOR (MISCELLANEOUS) ×3 IMPLANT
PULSAVAC PLUS IRRIG FAN TIP (DISPOSABLE) ×3
SOL .9 NS 3000ML IRR  AL (IV SOLUTION) ×4
SOL .9 NS 3000ML IRR UROMATIC (IV SOLUTION) ×2 IMPLANT
SPLINT CAST 1 STEP 3X12 (MISCELLANEOUS) ×3 IMPLANT
SPONGE LAP 18X18 RF (DISPOSABLE) ×3 IMPLANT
STAPLER SKIN PROX 35W (STAPLE) ×6 IMPLANT
STOCKINETTE BIAS CUT 4 980044 (GAUZE/BANDAGES/DRESSINGS) ×3 IMPLANT
STOCKINETTE IMPERVIOUS 9X36 MD (GAUZE/BANDAGES/DRESSINGS) ×3 IMPLANT
SUCTION FRAZIER HANDLE 10FR (MISCELLANEOUS) ×2
SUCTION TUBE FRAZIER 10FR DISP (MISCELLANEOUS) ×1 IMPLANT
SUT ETHILON 4-0 (SUTURE) ×2
SUT ETHILON 4-0 FS2 18XMFL BLK (SUTURE) ×1
SUT VIC AB 0 CT1 36 (SUTURE) ×12 IMPLANT
SUT VIC AB 2-0 CT1 27 (SUTURE) ×8
SUT VIC AB 2-0 CT1 36 (SUTURE) ×3 IMPLANT
SUT VIC AB 2-0 CT1 TAPERPNT 27 (SUTURE) ×4 IMPLANT
SUT VIC AB 4-0 SH 27 (SUTURE) ×2
SUT VIC AB 4-0 SH 27XANBCTRL (SUTURE) ×1 IMPLANT
SUT VICRYL+ 3-0 36IN CT-1 (SUTURE) ×3 IMPLANT
SUTURE ETHLN 4-0 FS2 18XMF BLK (SUTURE) ×1 IMPLANT
SWAB CULTURE AMIES ANAERIB BLU (MISCELLANEOUS) ×3 IMPLANT
SYR 10ML LL (SYRINGE) ×3 IMPLANT
SYR 20ML LL LF (SYRINGE) ×3 IMPLANT
SYR 30ML LL (SYRINGE) IMPLANT
TIP FAN IRRIG PULSAVAC PLUS (DISPOSABLE) ×1 IMPLANT
TRAY FOLEY SLVR 16FR LF STAT (SET/KITS/TRAYS/PACK) IMPLANT
WRAPON POLAR PAD KNEE (MISCELLANEOUS) ×3

## 2020-06-11 NOTE — Anesthesia Procedure Notes (Signed)
Procedure Name: LMA Insertion Date/Time: 06/11/2020 12:54 PM Performed by: Felix Ahmadi, RN Pre-anesthesia Checklist: Patient identified, Patient being monitored, Timeout performed, Emergency Drugs available and Suction available Patient Re-evaluated:Patient Re-evaluated prior to induction Oxygen Delivery Method: Circle system utilized Preoxygenation: Pre-oxygenation with 100% oxygen Induction Type: IV induction Ventilation: Mask ventilation without difficulty LMA: LMA inserted Tube type: Oral Number of attempts: 1 Placement Confirmation: positive ETCO2 and breath sounds checked- equal and bilateral Tube secured with: Tape Dental Injury: Teeth and Oropharynx as per pre-operative assessment

## 2020-06-11 NOTE — Consult Note (Signed)
NAME: Bethany Villa  DOB: 09-Dec-1955  MRN: [id number]  Date/Time: 06/11/2020 6:05 PM  REQUESTING PROVIDER: Dr.Poggi Subjective:  REASON FOR CONSULT: PJI ? Bethany Villa is a 64 y.o. female with a history of COPD,on home oxygen, chronic systolic CHF ,Left TKA 2017, presented to the ED on 06/10/20 with left knee pain. As per patient she developed a cold 2 weeks ago along with fever but brushed it off as a simple cold- She then had productive cough of yellow sputum- her knee was fine until yesterday when it started to hurt severely and she came to the hospital Vitals in the ED Temp 98.6, BP 126/86, HR 123, pulse ox 96%, RR 20 Labs showed cr 0.80, glucose 121, albumin 2.4, AST 32, ALT 23. WBC 17.3. HB 15.1, PLT 203 Blood culture sent. 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 Current smoker  Past Medical History:  Diagnosis Date  . Anxiety   . Asthma   . Chronic pain    lower back  . Chronic systolic CHF (congestive heart failure) (HCC) 09/22/2019  . COPD (chronic obstructive pulmonary disease) (HCC)   . Depression   . DJD (degenerative joint disease)   . Fibromyalgia   . GERD (gastroesophageal reflux disease)   . Headache   . Hyperlipidemia   . Lower extremity edema   . On home oxygen therapy    has not been on since 2016  . Panic attack   . Shortness of breath dyspnea   . Sleep disorder     Past Surgical History:  Procedure  Laterality Date  . ABDOMINAL SURGERY    . ANKLE SURGERY    . BACK SURGERY     lower  . CARPAL TUNNEL RELEASE    . CESAREAN SECTION    . KNEE SURGERY    . TONSILLECTOMY    . TOTAL KNEE ARTHROPLASTY Left 04/14/2016   Procedure: TOTAL KNEE ARTHROPLASTY;  Surgeon: Deeann Saint, MD;  Location: [facility] ORS;  Service: Orthopedics;  Laterality: Left;  . TUBAL LIGATION      Social History   Socioeconomic History  . Marital status: Divorced    Spouse name: Not on file  . Number of children: Not on file  . Years of education: Not on file  . Highest education level: Not on file  Occupational History  . Occupation: disabled  Tobacco Use  . Smoking status: Current Every Day Smoker    Packs/day: 1.00    Years: 45.00    Pack years: 45.00    Types: Cigarettes  . Smokeless tobacco: Never Used  Vaping Use  . Vaping Use: Never used  Substance and Sexual Activity  . Alcohol use: Yes    Alcohol/week: 0.0 standard drinks    Comment: rare  . Drug use: No  . Sexual activity: Yes    Birth control/protection: Surgical  Other Topics Concern  . Not on file  Social History Narrative  . Not on file   Social Determinants of Health  Financial Resource Strain:   . Difficulty of Paying Living Expenses: Not on file  Food Insecurity:   . Worried About Programme researcher, broadcasting/film/video in the Last Year: Not on file  . Ran Out of Food in the Last Year: Not on file  Transportation Needs:   . Lack of Transportation (Medical): Not on file  . Lack of Transportation (Non-Medical): Not on file  Physical Activity:   . Days of Exercise per Week: Not on file  . Minutes of Exercise per Session: Not on file  Stress:   . Feeling of Stress : Not on file  Social Connections:   . Frequency of Communication with Friends and Family: Not on file  . Frequency of Social Gatherings with Friends and Family: Not on file  . Attends Religious Services: Not on file  . Active Member of Clubs or Organizations: Not on file  . Attends  Banker Meetings: Not on file  . Marital Status: Not on file  Intimate Partner Violence:   . Fear of Current or Ex-Partner: Not on file  . Emotionally Abused: Not on file  . Physically Abused: Not on file  . Sexually Abused: Not on file    Family History  Problem Relation Age of Onset  . Cancer Mother        beast   Allergies  Allergen Reactions  . Gabapentin Shortness Of Breath and Rash  . Mobic [Meloxicam] Anaphylaxis  . Penicillins Shortness Of Breath and Swelling    Has patient had a PCN reaction causing immediate rash, facial/tongue/throat swelling, SOB or lightheadedness with hypotension: unknown Has patient had a PCN reaction causing severe rash involving mucus membranes or skin necrosis: {unknown Has patient had a PCN reaction that required hospitalization {unknown Has patient had a PCN reaction occurring within the last 10 years: no If all of the above answers are "NO", then may proceed with Cephalosporin use.  . Sulfa Antibiotics Shortness Of Breath and Swelling    ? Current Facility-Administered Medications  Medication Dose Route Frequency Provider Last Rate Last Admin  . 0.9 %  sodium chloride infusion   Intravenous Continuous Poggi, Excell Seltzer, MD 75 mL/hr at 06/11/20 1102 Restarted at 06/11/20 1244  . acetaminophen (TYLENOL) tablet 1,000 mg  1,000 mg Oral Q6H Poggi, Excell Seltzer, MD   1,000 mg at 06/11/20 1730  . [START ON 06/12/2020] acetaminophen (TYLENOL) tablet 325-650 mg  325-650 mg Oral Q6H PRN Poggi, Excell Seltzer, MD      . ALPRAZolam Prudy Feeler) tablet 0.5 mg  0.5 mg Oral BID PRN Poggi, Excell Seltzer, MD      . atorvastatin (LIPITOR) tablet 20 mg  20 mg Oral q1800 Poggi, Excell Seltzer, MD   20 mg at 06/11/20 1731  . bisacodyl (DULCOLAX) suppository 10 mg  10 mg Rectal Daily PRN Poggi, Excell Seltzer, MD      . buprenorphine (BUTRANS) 10 MCG/HR 2 patch  2 patch Transdermal Weekly Darlin Priestly, MD      . busPIRone (BUSPAR) tablet 5 mg  5 mg Oral TID Poggi, Excell Seltzer, MD      . cefTRIAXone  (ROCEPHIN) 2 g in sodium chloride 0.9 % 100 mL IVPB  2 g Intravenous Q24H Poggi, Excell Seltzer, MD      . chlorpheniramine-HYDROcodone (TUSSIONEX) 10-8 MG/5ML suspension 5 mL  5 mL Oral Q12H PRN Poggi, Excell Seltzer, MD   5 mL at 06/11/20 0043  . diphenhydrAMINE (BENADRYL) 12.5 MG/5ML elixir 12.5-25 mg  12.5-25 mg Oral Q4H  PRN Poggi, Excell Seltzer, MD      . docusate sodium (COLACE) capsule 100 mg  100 mg Oral BID Poggi, Excell Seltzer, MD      . Melene Muller ON 06/12/2020] enoxaparin (LOVENOX) injection 40 mg  40 mg Subcutaneous Q24H Poggi, Excell Seltzer, MD      . guaiFENesin (MUCINEX) 12 hr tablet 600 mg  600 mg Oral BID Christena Flake, MD   600 mg at 06/11/20 0930  . ipratropium-albuterol (DUONEB) 0.5-2.5 (3) MG/3ML nebulizer solution 3 mL  3 mL Nebulization QID Poggi, Excell Seltzer, MD   3 mL at 06/11/20 0932  . magnesium hydroxide (MILK OF MAGNESIA) suspension 30 mL  30 mL Oral Daily PRN Poggi, Excell Seltzer, MD      . magnesium hydroxide (MILK OF MAGNESIA) suspension 30 mL  30 mL Oral Daily PRN Poggi, Excell Seltzer, MD      . methylPREDNISolone sodium succinate (SOLU-MEDROL) 40 mg/mL injection 40 mg  40 mg Intravenous Q8H Poggi, Excell Seltzer, MD   40 mg at 06/11/20 0622   Followed by  . [START ON 06/12/2020] predniSONE (DELTASONE) tablet 40 mg  40 mg Oral Q breakfast Poggi, Excell Seltzer, MD      . metoCLOPramide (REGLAN) tablet 5-10 mg  5-10 mg Oral Q8H PRN Poggi, Excell Seltzer, MD       Or  . metoCLOPramide (REGLAN) injection 5-10 mg  5-10 mg Intravenous Q8H PRN Poggi, Excell Seltzer, MD      . metoprolol tartrate (LOPRESSOR) 5 MG/5ML injection           . morphine 2 MG/ML injection 2 mg  2 mg Intravenous Q4H PRN Darlin Priestly, MD   2 mg at 06/11/20 1725  . ondansetron (ZOFRAN) tablet 4 mg  4 mg Oral Q6H PRN Poggi, Excell Seltzer, MD       Or  . ondansetron (ZOFRAN) injection 4 mg  4 mg Intravenous Q6H PRN Poggi, Excell Seltzer, MD      . oxyCODONE (Oxy IR/ROXICODONE) immediate release tablet 5-10 mg  5-10 mg Oral Q4H PRN Poggi, Excell Seltzer, MD      . sertraline (ZOLOFT) tablet 50 mg  50 mg Oral Daily  Poggi, Excell Seltzer, MD      . sodium phosphate (FLEET) 7-19 GM/118ML enema 1 enema  1 enema Rectal Once PRN Poggi, Excell Seltzer, MD      . tiZANidine (ZANAFLEX) tablet 6 mg  6 mg Oral BID PRN Poggi, Excell Seltzer, MD      . topiramate (TOPAMAX) tablet 25 mg  25 mg Oral BID Poggi, Excell Seltzer, MD   25 mg at 06/11/20 0930  . traMADol (ULTRAM) tablet 50 mg  50 mg Oral Q6H PRN Poggi, Excell Seltzer, MD      . traZODone (DESYREL) tablet 25 mg  25 mg Oral QHS PRN Poggi, Excell Seltzer, MD      . vancomycin (VANCOCIN) IVPB 1000 mg/200 mL premix  1,000 mg Intravenous Q12H Rauer, Samantha O, RPH         Abtx:  Anti-infectives (From admission, onward)   Start     Dose/Rate Route Frequency Ordered Stop   06/11/20 1830  vancomycin (VANCOCIN) IVPB 1000 mg/200 mL premix  Status:  Discontinued        1,000 mg 200 mL/hr over 60 Minutes Intravenous  Once 06/11/20 1716 06/11/20 1735   06/11/20 1800  vancomycin (VANCOCIN) IVPB 1000 mg/200 mL premix        1,000 mg 200 mL/hr over 60  Minutes Intravenous Every 12 hours 06/11/20 1735     06/11/20 1300  vancomycin (VANCOCIN) IVPB 1000 mg/200 mL premix  Status:  Discontinued        1,000 mg 200 mL/hr over 60 Minutes Intravenous  Once 06/11/20 1256 06/11/20 1645   06/11/20 1200  cefTRIAXone (ROCEPHIN) 2 g in sodium chloride 0.9 % 100 mL IVPB        2 g 200 mL/hr over 30 Minutes Intravenous Every 24 hours 06/11/20 0302     06/11/20 0315  cefTRIAXone (ROCEPHIN) 2 g in sodium chloride 0.9 % 100 mL IVPB  Status:  Discontinued        2 g 200 mL/hr over 30 Minutes Intravenous Every 24 hours 06/11/20 0305 06/11/20 0319   06/10/20 2300  vancomycin (VANCOCIN) IVPB 1000 mg/200 mL premix        1,000 mg 200 mL/hr over 60 Minutes Intravenous  Once 06/10/20 2256 06/11/20 0100   06/10/20 2130  cefTRIAXone (ROCEPHIN) 1 g in sodium chloride 0.9 % 100 mL IVPB        1 g 200 mL/hr over 30 Minutes Intravenous  Once 06/10/20 2122 06/10/20 2235   06/10/20 2130  azithromycin (ZITHROMAX) 500 mg in sodium chloride 0.9 %  250 mL IVPB        500 mg 250 mL/hr over 60 Minutes Intravenous  Once 06/10/20 2122 06/11/20 0000      REVIEW OF SYSTEMS:  Const:fever, chills, negative weight loss Eyes: negative diplopia or visual changes, negative eye pain ENT: negative coryza, negative sore throat Resp:  cough, , dyspnea Cards: negative for chest pain, palpitations, lower extremity edema GU: negative for frequency, dysuria and hematuria GI: Negative for abdominal pain, diarrhea, bleeding, constipation Skin: negative for rash and pruritus Heme:  easy bruising  MS: generalized weakness Neurolo:negative for headaches, dizziness, vertigo, memory problems  Psych: negative for feelings of anxiety, depression  Endocrine: negative for thyroid, diabetes Allergy/Immunology- penicillin Objective:  VITALS:  BP 133/75 (BP Location: Left Arm)   Pulse 98   Temp 97.8 F (36.6 C) (Oral)   Resp 19   Ht 5\' 3"  (1.6 m)   Wt 77.1 kg   SpO2 94%   BMI 30.11 kg/m  PHYSICAL EXAM:  General: Alert, cooperative, some  distress, appears stated age.  Head: Normocephalic, without obvious abnormality, atraumatic. Eyes: Conjunctivae clear, anicteric sclerae. Pupils are equal ENT Nares normal. No drainage or sinus tenderness. Lips, mucosa, and tongue normal. No Thrush edentulous Neck: Supple, symmetrical, no adenopathy, thyroid: non tender no carotid bruit and no JVD. Lungs:b/l air entry Rhonchi ++ Heart: Tachycardia Abdomen: Soft, non-tender,not distended. Bowel sounds normal. No masses Extremities: left kne surgical dressing Skin: No rashes or lesions. Or bruising Lymph: Cervical, supraclavicular normal. Neurologic: Grossly non-focal Pertinent Labs Lab Results CBC    Component Value Date/Time   WBC 13.4 (H) 06/11/2020 0623   RBC 4.72 06/11/2020 0623   HGB 13.5 06/11/2020 0623   HGB 13.9 10/22/2013 1509   HCT 42.4 06/11/2020 0623   HCT 44.2 10/22/2013 1509   PLT 195 06/11/2020 0623   PLT 193 10/22/2013 1509   MCV  89.8 06/11/2020 0623   MCV 89 10/22/2013 1509   MCH 28.6 06/11/2020 0623   MCHC 31.8 06/11/2020 0623   RDW 13.9 06/11/2020 0623   RDW 17.5 (H) 10/22/2013 1509   LYMPHSABS 1.1 06/11/2020 0623   MONOABS 0.5 06/11/2020 0623   EOSABS 0.0 06/11/2020 0623   BASOSABS 0.0 06/11/2020 0623    CMP Latest  Ref Rng & Units 06/11/2020 06/10/2020 10/11/2019  Glucose 70 - 99 mg/dL 409(W) 119(J) 478(G)  BUN 8 - 23 mg/dL Creatinine 0.44 - 1.00 mg/dL 9.56 2.13 0.86  Sodium 135 - 145 mmol/L 136 136 140  Potassium 3.5 - 5.1 mmol/L 3.6 3.7 4.0  Chloride 98 - 111 mmol/L 98 96(L) 102  CO2 22 - 32 mmol/L Calcium 8.9 - 10.3 mg/dL 5.7(Q) 4.6(N) 8.9  Total Protein 6.5 - 8.1 g/dL 6.6 - -  Total Bilirubin 0.3 - 1.2 mg/dL 6.2(X) - -  Alkaline Phos 38 - 126 U/L 117 - -  AST 15 - 41 U/L 32 - -  ALT 0 - 44 U/L 23 - -      Microbiology: Recent Results (from the past 240 hour(s))  Blood culture (routine x 2)     Status: None (Preliminary result)   Collection Time: 06/10/20 10:00 PM   Specimen: BLOOD  Result Value Ref Range Status   Specimen Description BLOOD RIGHT HAND  Final   Special Requests   Final    BOTTLES DRAWN AEROBIC AND ANAEROBIC Blood Culture adequate volume   Culture  Setup Time   Final    GRAM POSITIVE COCCI IN BOTH AEROBIC AND ANAEROBIC BOTTLES Organism ID to follow CRITICAL RESULT CALLED TO, READ BACK BY AND VERIFIED WITH: KAREN HAYES 06/11/20 0912 KLW Performed at [facility], [address] [place], [place] [address]    Culture GRAM POSITIVE COCCI  Final   Report Status PENDING  Incomplete  Blood Culture ID Panel (Reflexed)     Status: Abnormal   Collection Time: 06/10/20 10:00 PM  Result Value Ref Range Status   Enterococcus faecalis NOT DETECTED NOT DETECTED Final   Enterococcus Faecium NOT DETECTED NOT DETECTED Final   Listeria monocytogenes NOT DETECTED NOT DETECTED Final   Staphylococcus species NOT DETECTED NOT DETECTED Final   Staphylococcus  aureus (BCID) NOT DETECTED NOT DETECTED Final   Staphylococcus epidermidis NOT DETECTED NOT DETECTED Final   Staphylococcus lugdunensis NOT DETECTED NOT DETECTED Final   Streptococcus species DETECTED (A) NOT DETECTED Final    Comment: CRITICAL RESULT CALLED TO, READ BACK BY AND VERIFIED WITH: KAREN HAYES 06/11/20 0912 KLW    Streptococcus agalactiae NOT DETECTED NOT DETECTED Final   Streptococcus pneumoniae DETECTED (A) NOT DETECTED Final    Comment: CRITICAL RESULT CALLED TO, READ BACK BY AND VERIFIED WITH: KAREN HAYES 06/11/20 0912 KLW    Streptococcus pyogenes NOT DETECTED NOT DETECTED Final   A.calcoaceticus-baumannii NOT DETECTED NOT DETECTED Final   Bacteroides fragilis NOT DETECTED NOT DETECTED Final   Enterobacterales NOT DETECTED NOT DETECTED Final   Enterobacter cloacae complex NOT DETECTED NOT DETECTED Final   Escherichia coli NOT DETECTED NOT DETECTED Final   Klebsiella aerogenes NOT DETECTED NOT DETECTED Final   Klebsiella oxytoca NOT DETECTED NOT DETECTED Final   Klebsiella pneumoniae NOT DETECTED NOT DETECTED Final   Proteus species NOT DETECTED NOT DETECTED Final   Salmonella species NOT DETECTED NOT DETECTED Final   Serratia marcescens NOT DETECTED NOT DETECTED Final   Haemophilus influenzae NOT DETECTED NOT DETECTED Final   Neisseria meningitidis NOT DETECTED NOT DETECTED Final   Pseudomonas aeruginosa NOT DETECTED NOT DETECTED Final   Stenotrophomonas maltophilia NOT DETECTED NOT DETECTED Final   Candida albicans NOT DETECTED NOT DETECTED Final   Candida auris NOT DETECTED NOT DETECTED Final   Candida glabrata NOT DETECTED NOT DETECTED Final   Candida krusei NOT DETECTED  NOT DETECTED Final   Candida parapsilosis NOT DETECTED NOT DETECTED Final   Candida tropicalis NOT DETECTED NOT DETECTED Final   Cryptococcus neoformans/gattii NOT DETECTED NOT DETECTED Final    Comment: Performed at [facility], [address] [place], [place] [address]   Respiratory Panel by RT PCR (Flu A&B, Covid) - Nasopharyngeal Swab     Status: None   Collection Time: 06/10/20 10:05 PM   Specimen: Nasopharyngeal Swab  Result Value Ref Range Status   SARS Coronavirus 2 by RT PCR NEGATIVE NEGATIVE Final    Comment: (NOTE) SARS-CoV-2 target nucleic acids are NOT DETECTED.  The SARS-CoV-2 RNA is generally detectable in upper respiratoy specimens during the acute phase of infection. The lowest concentration of SARS-CoV-2 viral copies this assay can detect is 131 copies/mL. A negative result does not preclude SARS-Cov-2 infection and should not be used as the sole basis for treatment or other patient management decisions. A negative result may occur with  improper specimen collection/handling, submission of specimen other than nasopharyngeal swab, presence of viral mutation(s) within the areas targeted by this assay, and inadequate number of viral copies (<131 copies/mL). A negative result must be combined with clinical observations, patient history, and epidemiological information. The expected result is Negative.  Fact Sheet for Patients:  https://www.moore.com/https://www.fda.gov/media/142436/download  Fact Sheet for Healthcare Providers:  https://www.young.biz/https://www.fda.gov/media/142435/download  This test is no t yet approved or cleared by the [place] FDA and  has been authorized for detection and/or diagnosis of SARS-CoV-2 by FDA under an Emergency Use Authorization (EUA). This EUA will remain  in effect (meaning this test can be used) for the duration of the COVID-19 declaration under Section 564(b)(1) of the Act, 21 U.S.C. section 360bbb-3(b)(1), unless the authorization is terminated or revoked sooner.     Influenza A by PCR NEGATIVE NEGATIVE Final   Influenza B by PCR NEGATIVE NEGATIVE Final    Comment: (NOTE) The Xpert Xpress SARS-CoV-2/FLU/RSV assay is intended as an aid in  the diagnosis of influenza from Nasopharyngeal swab specimens and  should not be used as  a sole basis for treatment. Nasal washings and  aspirates are unacceptable for Xpert Xpress SARS-CoV-2/FLU/RSV  testing.  Fact Sheet for Patients: https://www.moore.com/https://www.fda.gov/media/142436/download  Fact Sheet for Healthcare Providers: https://www.young.biz/https://www.fda.gov/media/142435/download  This test is not yet approved or cleared by the [place] FDA and  has been authorized for detection and/or diagnosis of SARS-CoV-2 by  FDA under an Emergency Use Authorization (EUA). This EUA will remain  in effect (meaning this test can be used) for the duration of the  Covid-19 declaration under Section 564(b)(1) of the Act, 21  U.S.C. section 360bbb-3(b)(1), unless the authorization is  terminated or revoked. Performed at [facility], [address] [place], [place] [address]   Blood culture (routine x 2)     Status: None (Preliminary result)   Collection Time: 06/10/20 10:05 PM   Specimen: BLOOD  Result Value Ref Range Status   Specimen Description BLOOD LEFT AC  Final   Special Requests   Final    BOTTLES DRAWN AEROBIC AND ANAEROBIC Blood Culture adequate volume   Culture  Setup Time   Final    GRAM POSITIVE COCCI IN BOTH AEROBIC AND ANAEROBIC BOTTLES CRITICAL RESULT CALLED TO, READ BACK BY AND VERIFIED WITH: KAREN HAYES 06/11/20 0912 KLW Performed at [facility], [address] [place], [place] [address]    Culture GRAM POSITIVE COCCI  Final   Report Status PENDING  Incomplete  Body fluid  culture     Status: None (Preliminary result)   Collection Time: 06/10/20 10:05 PM   Specimen: KNEE; Body Fluid  Result Value Ref Range Status   Specimen Description   Final    KNEE RIGHT Performed at [facility], [address], [place], [place] [address]    Special Requests   Final    NONE Performed at [facility], [address] [place], [place] [address]    Gram Stain   Final    ABUNDANT WBC PRESENT, PREDOMINANTLY PMN FEW GRAM POSITIVE COCCI RESULT CALLED  TO, READ BACK BY AND VERIFIED WITHSolon Augusta RN 06/11/20 0556 JDW Performed at [facility], [address] [place], [place] [address]    Culture PENDING  Incomplete   Report Status PENDING  Incomplete    IMAGING RESULTS:  I have personally reviewed the films ? Impression/Recommendation ? ?Strep pneumoniae bacteremia due to rt upper lobe pneumonia Currently on vanco and ceftriaxone until susceptibility is available Will get a baseline 2 d echo As patient will need 6 weeks of antibiotics anyway will not pursue endocarditis workup beyond a 2 d echo  Left knee prosthetic joint infection- acute S/p I/D and capsule exchange Will need minimum 6 weeks of IV antibiotic and then PO antibiotic for a total of 6 months as there is retained hardware  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 ___________________________________________________ Discussed with patient,in detail ID will follow along

## 2020-06-11 NOTE — Progress Notes (Signed)
Pharmacy Antibiotic Note  Bethany Villa is a 64 y.o. female with PMH dyslipidemia, fibromyalgia, GERD, COPD, depression, and anxiety admitted on 06/10/2020 with CAP and knee swelling. Patient given one time dose of azithromycin and continued on ceftriaxone. 10/12 Bcx and knee culture GPC; BCID positive for S pneumonia. Patient received vancomycin 1000mg  10/13 at 0000. Pharmacy has been consulted for Vancomycin dosing for wound infection.  10/13: I&D of acutely septic knee and placement of antibiotic beads impregnated with 1g vancomycin  WBC 17.3>13.4, Scr <1, afebrile  Plan: Will not give loading dose and will start vancomycin 1000mg  q12h per dosing nomogram.  --Monitor renal function and adjust dose as clinically indicated --Obtain vanc level prior to 4th or 5th dose  Height: 5\' 3"  (160 cm) Weight: 77.1 kg (170 lb) IBW/kg (Calculated) : 52.4  Temp (24hrs), Avg:97.9 F (36.6 C), Min:97.2 F (36.2 C), Max:98.6 F (37 C)  Recent Labs  Lab 06/10/20 1818 06/10/20 1929 06/10/20 2205 06/11/20 0623  WBC 17.3*  --   --  13.4*  CREATININE  --  0.80  --  0.70  LATICACIDVEN  --   --  1.7  --     Estimated Creatinine Clearance: 70.8 mL/min (by C-G formula based on SCr of 0.7 mg/dL).    Allergies  Allergen Reactions  . Gabapentin Shortness Of Breath and Rash  . Mobic [Meloxicam] Anaphylaxis  . Penicillins Shortness Of Breath and Swelling    Has patient had a PCN reaction causing immediate rash, facial/tongue/throat swelling, SOB or lightheadedness with hypotension: unknown Has patient had a PCN reaction causing severe rash involving mucus membranes or skin necrosis: {unknown Has patient had a PCN reaction that required hospitalization {unknown Has patient had a PCN reaction occurring within the last 10 years: no If all of the above answers are "NO", then may proceed with Cephalosporin use.  . Sulfa Antibiotics Shortness Of Breath and Swelling    Antimicrobials this  admission: 10/12 azithromycin x1  10/12 ceftriaxone >> 10/13 vancomycin >>    Microbiology results: 10/12 BCx: GPC 10/12 Knee: GPC 10/12 BCID 4/4 bottles S pneumoniae  Thank you for allowing pharmacy to be a part of this patient's care.  12/12, PharmD Pharmacy Resident  06/11/2020 6:08 PM

## 2020-06-11 NOTE — Progress Notes (Signed)
PT Cancellation Note  Patient Details Name: Bethany Villa MRN: [id number] DOB: 04-30-1956   Cancelled Treatment:    Reason Eval/Treat Not Completed: Other (comment). Consult received and chart reviewed. Pt currently pending surgical procedure after ortho consult. Will hold PT consult at this time. May need new PT consult pending change in status.   Endrit Gittins 06/11/2020, 11:52 AM  [name], PT, DPT [phone]

## 2020-06-11 NOTE — ED Notes (Signed)
Provider at bedside

## 2020-06-11 NOTE — Progress Notes (Signed)
PHARMACY - PHYSICIAN COMMUNICATION CRITICAL VALUE ALERT - BLOOD CULTURE IDENTIFICATION (BCID)  Bethany Villa is an 64 y.o. female who presented to [facility] on 06/10/2020 with a chief complaint of Shortness of breath and knee pain  Assessment:  Patient with CAP and knee swelling with mild warmth and dec range of motion.  10/12 Blood culture and knee culture with GPC.  BCID = S. pneumoniae  Name of physician (or Provider) Contacted: Dr Fran Lowes  Current antibiotics: Ceftriaxone  Changes to prescribed antibiotics recommended:  Patient is on recommended antibiotics - No changes needed  Results for orders placed or performed during the hospital encounter of 06/10/20  Blood Culture ID Panel (Reflexed) (Collected: 06/10/2020 10:00 PM)  Result Value Ref Range   Enterococcus faecalis NOT DETECTED NOT DETECTED   Enterococcus Faecium NOT DETECTED NOT DETECTED   Listeria monocytogenes NOT DETECTED NOT DETECTED   Staphylococcus species NOT DETECTED NOT DETECTED   Staphylococcus aureus (BCID) NOT DETECTED NOT DETECTED   Staphylococcus epidermidis NOT DETECTED NOT DETECTED   Staphylococcus lugdunensis NOT DETECTED NOT DETECTED   Streptococcus species DETECTED (A) NOT DETECTED   Streptococcus agalactiae NOT DETECTED NOT DETECTED   Streptococcus pneumoniae DETECTED (A) NOT DETECTED   Streptococcus pyogenes NOT DETECTED NOT DETECTED   A.calcoaceticus-baumannii NOT DETECTED NOT DETECTED   Bacteroides fragilis NOT DETECTED NOT DETECTED   Enterobacterales NOT DETECTED NOT DETECTED   Enterobacter cloacae complex NOT DETECTED NOT DETECTED   Escherichia coli NOT DETECTED NOT DETECTED   Klebsiella aerogenes NOT DETECTED NOT DETECTED   Klebsiella oxytoca NOT DETECTED NOT DETECTED   Klebsiella pneumoniae NOT DETECTED NOT DETECTED   Proteus species NOT DETECTED NOT DETECTED   Salmonella species NOT DETECTED NOT DETECTED   Serratia marcescens NOT DETECTED NOT DETECTED   Haemophilus influenzae NOT DETECTED  NOT DETECTED   Neisseria meningitidis NOT DETECTED NOT DETECTED   Pseudomonas aeruginosa NOT DETECTED NOT DETECTED   Stenotrophomonas maltophilia NOT DETECTED NOT DETECTED   Candida albicans NOT DETECTED NOT DETECTED   Candida auris NOT DETECTED NOT DETECTED   Candida glabrata NOT DETECTED NOT DETECTED   Candida krusei NOT DETECTED NOT DETECTED   Candida parapsilosis NOT DETECTED NOT DETECTED   Candida tropicalis NOT DETECTED NOT DETECTED   Cryptococcus neoformans/gattii NOT DETECTED NOT DETECTED    Juliette Alcide, PharmD, BCPS.   Work Cell: [phone] 06/11/2020 9:37 AM

## 2020-06-11 NOTE — Anesthesia Postprocedure Evaluation (Signed)
Anesthesia Post Note  Patient: Bethany Villa  Procedure(s) Performed: IRRIGATION AND DEBRIDEMENT KNEE WITH POLY EXCHANGE (N/A Knee)  Patient location during evaluation: PACU Anesthesia Type: General Level of consciousness: awake and alert Pain management: pain level controlled Vital Signs Assessment: post-procedure vital signs reviewed and stable Respiratory status: spontaneous breathing and respiratory function stable Cardiovascular status: stable Anesthetic complications: no   No complications documented.   Last Vitals:  Vitals:   06/11/20 1138 06/11/20 1510  BP: 138/90 (P) 125/79  Pulse: 89 (!) (P) 119  Resp: (!) 22 (P) 17  Temp: 36.7 C (!) 36.2 C  SpO2: 98% (P) 97%    Last Pain:  Vitals:   06/11/20 1510  TempSrc:   PainSc: (P) Asleep                 Tyrone Balash K

## 2020-06-11 NOTE — ED Notes (Signed)
Per MD Fran Lowes, okay to give topiramate and cough medication orally. RN to hold all other PO medications at this time per verbal order from MD Fran Lowes.

## 2020-06-11 NOTE — Progress Notes (Signed)
PROGRESS NOTE    Bethany Villa  JJO:[id number] DOB: May 10, 1956 DOA: 06/10/2020 PCP: [name], MD    Assessment & Plan:   Active Problems:   CAP (community acquired pneumonia)   Bethany Villa  is a 64 y.o. Caucasian female with a known history of asthma, dyslipidemia, fibromyalgia, GERD, COPD, depression and anxiety, who presented to the emergency room with acute onset of dyspnea which has been worsening over the last week with associated cough productive of clear sputum as well as wheezing with nausea without vomiting or abdominal pain.  She is having mild dizziness without headache or blurred vision or paresthesias or focal muscle weakness..  She is also been having mildly worsening left knee pain with swelling since earlier during the day.  She denied any insect bites or trauma.  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  She admits to fever and chills.  No chest pain or palpitations.   # Strep pneumoniae bacteremia due to right upper lobe PNA -ID consulted PLAN: --continue vanc/ceftriaxone --2d echo  # Left knee prosthetic joint infection S/p OR washout  --continue vanc/ceftriaxone --Per ID, "Will need minimum 6 weeks of IV antibiotic and then PO antibiotic for a total of 6 months as there is retained hardware." --IV morphine and oxycodone PRN --drain management per ortho  # Acute on chronic COPD exacerbation # Chronic hypoxic respiratory failure on home night-time O2 -Pt complained of dyspnea and cough.  Did have PNA.  Started tx for COPD exacerbation on admission. PLAN: --continue steroid and taper --continue DuoNeb QID  4.  Depression and anxiety. -on home Zoloft, Topamax, trazodone, BuSpar and Xanax --cont home regimen.  5.  Dyslipidemia. -cont home statin   DVT prophylaxis: Lovenox SQ Code Status: Full code  Family Communication:  Status is: inpatient Dispo:   The patient is from: home Anticipated d/c is to: likely  SNF Anticipated d/c date is: > 3 days Patient currently is not medically stable to d/c due to: septic joint, and bacteremia, on IV abx, pending cx results   Subjective and Interval History:  Pt seen post OR knee washout.  Pt complained of pain in her left knee.  Dyspnea improved.   Objective: Vitals:   06/11/20 2019 06/11/20 2039 06/11/20 2144 06/11/20 2315  BP:  120/66 122/65 119/71  Pulse:  97 100 95  Resp:  (!) 22 16 18   Temp:  97.7 F (36.5 C) 97.6 F (36.4 C) 97.7 F (36.5 C)  TempSrc:  Oral Oral Oral  SpO2: 93% 94% 94% 92%  Weight:      Height:        Intake/Output Summary (Last 24 hours) at 06/12/2020 0131 Last data filed at 06/11/2020 1852 Gross per 24 hour  Intake 1362.22 ml  Output 80 ml  Net 1282.22 ml   Filed Weights   06/10/20 1808 06/11/20 1138  Weight: 77.1 kg 77.1 kg    Examination:   Constitutional: NAD, AAOx3 HEENT: conjunctivae and lids normal, EOMI CV: RRR no M,R,G. Distal pulses +2.  No cyanosis.   RESP: diffuse mild wheezing, normal respiratory effort  GI: +BS, NTND Extremities: left knee draining dark red blood SKIN: warm, dry and intact Neuro: II - XII grossly intact.  Sensation intact Psych: depressed mood and affect.     Data Reviewed: I have personally reviewed following labs and imaging studies  CBC: Recent Labs  Lab 06/10/20 1818 06/11/20 0623  WBC 17.3* 13.4*  NEUTROABS  --  11.5*  HGB 15.1* 13.5  HCT 47.6* 42.4  MCV 90.2 89.8  PLT 203 195   Basic Metabolic Panel: Recent Labs  Lab 06/10/20 1929 06/11/20 0623  NA 136 136  K 3.7 3.6  CL 96* 98  CO2 22 27  GLUCOSE 121* 144*  BUN 20 22  CREATININE 0.80 0.70  CALCIUM 8.8* 8.5*   GFR: Estimated Creatinine Clearance: 70.8 mL/min (by C-G formula based on SCr of 0.7 mg/dL). Liver Function Tests: Recent Labs  Lab 06/11/20 0623  AST 32  ALT 23  ALKPHOS 117  BILITOT 1.3*  PROT 6.6  ALBUMIN 2.4*   No results for input(s): LIPASE, AMYLASE in the last 168  hours. No results for input(s): AMMONIA in the last 168 hours. Coagulation Profile: Recent Labs  Lab 06/11/20 0623  INR 1.4*   Cardiac Enzymes: No results for input(s): CKTOTAL, CKMB, CKMBINDEX, TROPONINI in the last 168 hours. BNP (last 3 results) No results for input(s): PROBNP in the last 8760 hours. HbA1C: No results for input(s): HGBA1C in the last 72 hours. CBG: No results for input(s): GLUCAP in the last 168 hours. Lipid Profile: No results for input(s): CHOL, HDL, LDLCALC, TRIG, CHOLHDL, LDLDIRECT in the last 72 hours. Thyroid Function Tests: No results for input(s): TSH, T4TOTAL, FREET4, T3FREE, THYROIDAB in the last 72 hours. Anemia Panel: No results for input(s): VITAMINB12, FOLATE, FERRITIN, TIBC, IRON, RETICCTPCT in the last 72 hours. Sepsis Labs: Recent Labs  Lab 06/10/20 1929 06/10/20 2205  PROCALCITON 1.24  --   LATICACIDVEN  --  1.7    Recent Results (from the past 240 hour(s))  Blood culture (routine x 2)     Status: None (Preliminary result)   Collection Time: 06/10/20 10:00 PM   Specimen: BLOOD  Result Value Ref Range Status   Specimen Description BLOOD RIGHT HAND  Final   Special Requests   Final    BOTTLES DRAWN AEROBIC AND ANAEROBIC Blood Culture adequate volume   Culture  Setup Time   Final    GRAM POSITIVE COCCI IN BOTH AEROBIC AND ANAEROBIC BOTTLES Organism ID to follow CRITICAL RESULT CALLED TO, READ BACK BY AND VERIFIED WITH: KAREN HAYES 06/11/20 0912 KLW Performed at [facility], [address] [place], [place] [address]    Culture GRAM POSITIVE COCCI  Final   Report Status PENDING  Incomplete  Blood Culture ID Panel (Reflexed)     Status: Abnormal   Collection Time: 06/10/20 10:00 PM  Result Value Ref Range Status   Enterococcus faecalis NOT DETECTED NOT DETECTED Final   Enterococcus Faecium NOT DETECTED NOT DETECTED Final   Listeria monocytogenes NOT DETECTED NOT DETECTED Final   Staphylococcus species NOT DETECTED NOT  DETECTED Final   Staphylococcus aureus (BCID) NOT DETECTED NOT DETECTED Final   Staphylococcus epidermidis NOT DETECTED NOT DETECTED Final   Staphylococcus lugdunensis NOT DETECTED NOT DETECTED Final   Streptococcus species DETECTED (A) NOT DETECTED Final    Comment: CRITICAL RESULT CALLED TO, READ BACK BY AND VERIFIED WITH: KAREN HAYES 06/11/20 0912 KLW    Streptococcus agalactiae NOT DETECTED NOT DETECTED Final   Streptococcus pneumoniae DETECTED (A) NOT DETECTED Final    Comment: CRITICAL RESULT CALLED TO, READ BACK BY AND VERIFIED WITH: KAREN HAYES 06/11/20 0912 KLW    Streptococcus pyogenes NOT DETECTED NOT DETECTED Final   A.calcoaceticus-baumannii NOT DETECTED NOT DETECTED Final   Bacteroides fragilis NOT DETECTED NOT DETECTED Final   Enterobacterales NOT DETECTED NOT DETECTED Final   Enterobacter cloacae complex  NOT DETECTED NOT DETECTED Final   Escherichia coli NOT DETECTED NOT DETECTED Final   Klebsiella aerogenes NOT DETECTED NOT DETECTED Final   Klebsiella oxytoca NOT DETECTED NOT DETECTED Final   Klebsiella pneumoniae NOT DETECTED NOT DETECTED Final   Proteus species NOT DETECTED NOT DETECTED Final   Salmonella species NOT DETECTED NOT DETECTED Final   Serratia marcescens NOT DETECTED NOT DETECTED Final   Haemophilus influenzae NOT DETECTED NOT DETECTED Final   Neisseria meningitidis NOT DETECTED NOT DETECTED Final   Pseudomonas aeruginosa NOT DETECTED NOT DETECTED Final   Stenotrophomonas maltophilia NOT DETECTED NOT DETECTED Final   Candida albicans NOT DETECTED NOT DETECTED Final   Candida auris NOT DETECTED NOT DETECTED Final   Candida glabrata NOT DETECTED NOT DETECTED Final   Candida krusei NOT DETECTED NOT DETECTED Final   Candida parapsilosis NOT DETECTED NOT DETECTED Final   Candida tropicalis NOT DETECTED NOT DETECTED Final   Cryptococcus neoformans/gattii NOT DETECTED NOT DETECTED Final    Comment: Performed at [facility], [address]  [place], [place] [address]  Respiratory Panel by RT PCR (Flu A&B, Covid) - Nasopharyngeal Swab     Status: None   Collection Time: 06/10/20 10:05 PM   Specimen: Nasopharyngeal Swab  Result Value Ref Range Status   SARS Coronavirus 2 by RT PCR NEGATIVE NEGATIVE Final    Comment: (NOTE) SARS-CoV-2 target nucleic acids are NOT DETECTED.  The SARS-CoV-2 RNA is generally detectable in upper respiratoy specimens during the acute phase of infection. The lowest concentration of SARS-CoV-2 viral copies this assay can detect is 131 copies/mL. A negative result does not preclude SARS-Cov-2 infection and should not be used as the sole basis for treatment or other patient management decisions. A negative result may occur with  improper specimen collection/handling, submission of specimen other than nasopharyngeal swab, presence of viral mutation(s) within the areas targeted by this assay, and inadequate number of viral copies (<131 copies/mL). A negative result must be combined with clinical observations, patient history, and epidemiological information. The expected result is Negative.  Fact Sheet for Patients:  https://www.moore.com/  Fact Sheet for Healthcare Providers:  https://www.young.biz/  This test is no t yet approved or cleared by the [place] FDA and  has been authorized for detection and/or diagnosis of SARS-CoV-2 by FDA under an Emergency Use Authorization (EUA). This EUA will remain  in effect (meaning this test can be used) for the duration of the COVID-19 declaration under Section 564(b)(1) of the Act, 21 U.S.C. section 360bbb-3(b)(1), unless the authorization is terminated or revoked sooner.     Influenza A by PCR NEGATIVE NEGATIVE Final   Influenza B by PCR NEGATIVE NEGATIVE Final    Comment: (NOTE) The Xpert Xpress SARS-CoV-2/FLU/RSV assay is intended as an aid in  the diagnosis of influenza from Nasopharyngeal swab specimens and   should not be used as a sole basis for treatment. Nasal washings and  aspirates are unacceptable for Xpert Xpress SARS-CoV-2/FLU/RSV  testing.  Fact Sheet for Patients: https://www.moore.com/  Fact Sheet for Healthcare Providers: https://www.young.biz/  This test is not yet approved or cleared by the [place] FDA and  has been authorized for detection and/or diagnosis of SARS-CoV-2 by  FDA under an Emergency Use Authorization (EUA). This EUA will remain  in effect (meaning this test can be used) for the duration of the  Covid-19 declaration under Section 564(b)(1) of the Act, 21  U.S.C. section 360bbb-3(b)(1), unless the authorization is  terminated or revoked. Performed at  [facility], [address] [place], [place] [address]   Blood culture (routine x 2)     Status: None (Preliminary result)   Collection Time: 06/10/20 10:05 PM   Specimen: BLOOD  Result Value Ref Range Status   Specimen Description BLOOD LEFT AC  Final   Special Requests   Final    BOTTLES DRAWN AEROBIC AND ANAEROBIC Blood Culture adequate volume   Culture  Setup Time   Final    GRAM POSITIVE COCCI IN BOTH AEROBIC AND ANAEROBIC BOTTLES CRITICAL RESULT CALLED TO, READ BACK BY AND VERIFIED WITH: KAREN HAYES 06/11/20 0912 KLW Performed at [facility], [address] [place], [place] [address]    Culture GRAM POSITIVE COCCI  Final   Report Status PENDING  Incomplete  Body fluid culture     Status: None (Preliminary result)   Collection Time: 06/10/20 10:05 PM   Specimen: KNEE; Body Fluid  Result Value Ref Range Status   Specimen Description   Final    KNEE RIGHT Performed at [facility], [address] [place], [place] [address]    Special Requests   Final    NONE Performed at [facility], [address] [place], [place] [address]    Gram Stain   Final    ABUNDANT WBC PRESENT, PREDOMINANTLY PMN FEW GRAM  POSITIVE COCCI RESULT CALLED TO, READ BACK BY AND VERIFIED WITHSolon Augusta RN 06/11/20 0556 JDW Performed at [facility], [address] [place], [place] [address]    Culture PENDING  Incomplete   Report Status PENDING  Incomplete  Aerobic/Anaerobic Culture (surgical/deep wound)     Status: None (Preliminary result)   Collection Time: 06/11/20  1:40 PM   Specimen: PATH Soft tissue  Result Value Ref Range Status   Specimen Description   Final    WOUND Performed at [facility], [address], [place], [place] [address]    Special Requests   Final    LEFT KNEE WOUND CULTURE Performed at [facility], [address] [place], [place] [address]    Gram Stain   Final    FEW WBC PRESENT,BOTH PMN AND MONONUCLEAR RARE GRAM POSITIVE COCCI Performed at [facility], [address] [place], [place] [address]    Culture PENDING  Incomplete   Report Status PENDING  Incomplete      Radiology Studies: DG Chest 2 View  Result Date: 06/10/2020 CLINICAL DATA:  Wheezing. EXAM: CHEST - 2 VIEW COMPARISON:  October 08, 2019. FINDINGS: The heart size and mediastinal contours are within normal limits. No pneumothorax or pleural effusion is noted. Left lung is clear. New large right upper lobe airspace opacity is noted consistent pneumonia. The visualized skeletal structures are unremarkable. IMPRESSION: New large right upper lobe pneumonia. Electronically Signed   By: Lupita Raider M.D.   On: 06/10/2020 19:43   DG Knee 2 Views Left  Result Date: 06/10/2020 CLINICAL DATA:  Knee pain EXAM: LEFT KNEE - 2 VIEW COMPARISON:  05/16/2018 FINDINGS: Left knee prosthesis is again seen and stable. No dislocation or acute fracture is noted. Small joint effusion is seen. No other focal abnormality is noted. IMPRESSION: Small joint effusion without acute bony abnormality. Electronically Signed   By: Alcide Clever M.D.   On: 06/10/2020 18:51   DG Knee Left Port  Result Date:  06/11/2020 CLINICAL DATA:  Status post left knee debridement EXAM: PORTABLE LEFT KNEE - 1-2 VIEW COMPARISON:  06/10/2020  FINDINGS: The prosthesis is again identified and stable. Two surgical drains are noted in place. Multiple radiopaque antibiotic beads are noted surrounding the distal femur. IMPRESSION: Status post debridement and antibiotic bead placement Electronically Signed   By: Alcide Clever M.D.   On: 06/11/2020 17:31     Scheduled Meds: . acetaminophen  1,000 mg Oral Q6H  . atorvastatin  20 mg Oral q1800  . buprenorphine  2 patch Transdermal Weekly  . busPIRone  5 mg Oral TID  . docusate sodium  100 mg Oral BID  . enoxaparin (LOVENOX) injection  40 mg Subcutaneous Q24H  . guaiFENesin  600 mg Oral BID  . ipratropium-albuterol  3 mL Nebulization QID  . methylPREDNISolone (SOLU-MEDROL) injection  40 mg Intravenous Q8H   Followed by  . predniSONE  40 mg Oral Q breakfast  . metoprolol tartrate      . sertraline  50 mg Oral Daily  . topiramate  25 mg Oral BID   Continuous Infusions: . sodium chloride 75 mL/hr at 06/11/20 1102  . cefTRIAXone (ROCEPHIN)  IV    . vancomycin 1,000 mg (06/11/20 1848)     LOS: 2 days     Darlin Priestly, MD [facility] If 7PM-7AM, please contact night-coverage 06/12/2020, 1:31 AM

## 2020-06-11 NOTE — Transfer of Care (Signed)
Immediate Anesthesia Transfer of Care Note  Patient: Bethany Villa  Procedure(s) Performed: IRRIGATION AND DEBRIDEMENT KNEE WITH POLY EXCHANGE (N/A Knee)  Patient Location: PACU  Anesthesia Type:General  Level of Consciousness: sedated and drowsy  Airway & Oxygen Therapy: Patient Spontanous Breathing and Patient connected to face mask oxygen  Post-op Assessment: Report given to RN and Post -op Vital signs reviewed and stable  Post vital signs: Reviewed and stable  Last Vitals:  Vitals Value Taken Time  BP    Temp 36.2 C 06/11/20 1510  Pulse 120 06/11/20 1518  Resp 16 06/11/20 1518  SpO2 97 % 06/11/20 1518  Vitals shown include unvalidated device data.  Last Pain:  Vitals:   06/11/20 1138  TempSrc:   PainSc: 10-Worst pain ever         Complications: No complications documented.

## 2020-06-11 NOTE — ED Notes (Signed)
MD at bedside obtaining surgical consent from patient.

## 2020-06-11 NOTE — Evaluation (Signed)
Occupational Therapy Evaluation Patient Details Name: Bethany Villa MRN: [id number] DOB: Feb 24, 1956 Today's Date: 06/11/2020    History of Present Illness 64 y.o. Caucasian female with a known history of asthma, dyslipidemia, fibromyalgia, GERD, COPD, depression and anxiety, who presented to the emergency room with acute onset of dyspnea which has been worsening over the last week with associated cough productive of clear sputum as well as wheezing with nausea without vomiting or abdominal pain.  She is having mild dizziness without headache or blurred vision or paresthesias or focal muscle weakness..  She is also been having mildly worsening left knee pain with swelling since earlier during the day.  She denied any insect bites or trauma. Pt with confirmed pneumonia and possible I & D on L knee 10/13.   Clinical Impression   Patient presenting with decreased I in self care, balance, functional mobility/transfers, endurance, and safety awareness.  Patient reports being mod I with use of RW and living at home alone PTA. Pt has family nearby that assists with getting her to appointments and grocery store because she does not drive. Pt reports increasing difficulty with ambulation and SOB over the last week.  Patient currently functioning at set up A for UB self care and grooming tasks and mod - max A for LB self care. Pt reports 10/10 pain in L knee and back during evaluation and pt declined OOB assessment and EOB. RN arrived at end of session to let pt know she may be having I & D on L knee today. OT will continue to assess/follow after procedure to determine if needs change and further OOB assessment.  Patient will benefit from acute OT to increase overall independence in the areas of ADLs, functional mobility, and safety awareness in order to safely discharge home.    Follow Up Recommendations  Home health OT;Supervision - Intermittent    Equipment Recommendations  3 in 1 bedside commode        Precautions / Restrictions Precautions Precautions: Fall      Mobility Bed Mobility Overal bed mobility: Needs Assistance Bed Mobility: Rolling Rolling: Supervision         General bed mobility comments: HOB elevated  Transfers      General transfer comment: Pt declined secondary to pain        ADL either performed or assessed with clinical judgement   ADL Overall ADL's : Needs assistance/impaired          General ADL Comments: Secondary to pain this session pt would likely need mod - max A for LB self care/clothing management. Pt declined OOB activities but able to adjust self in bed without assistance.     Vision Baseline Vision/History: No visual deficits Patient Visual Report: No change from baseline              Pertinent Vitals/Pain Pain Assessment: 0-10 Pain Score: 10-Worst pain ever Pain Location: L knee and back Pain Descriptors / Indicators: Aching;Discomfort;Guarding;Grimacing Pain Intervention(s): Limited activity within patient's tolerance;Monitored during session;Repositioned;Patient requesting pain meds-RN notified     Hand Dominance Right   Extremity/Trunk Assessment Upper Extremity Assessment Upper Extremity Assessment: Overall WFL for tasks assessed   Lower Extremity Assessment Lower Extremity Assessment: Defer to PT evaluation       Communication Communication Communication: No difficulties   Cognition Arousal/Alertness: Awake/alert Behavior During Therapy: WFL for tasks assessed/performed Overall Cognitive Status: Within Functional Limits for tasks assessed  Home Living Family/patient expects to be discharged to:: Private residence Living Arrangements: Alone Available Help at Discharge: Friend(s);Available PRN/intermittently;Family Type of Home: Apartment Home Access: Stairs to enter;Level entry Entrance Stairs-Number of Steps: 1/threshold Entrance Stairs-Rails: None Home Layout: One level      Bathroom Shower/Tub: Chief Strategy Officer: Handicapped height Bathroom Accessibility: Yes   Home Equipment: Shower seat;Grab bars - tub/shower;Grab bars - toilet;Cane - single point   Additional Comments: O2 at home on 2L. Pt reports no falls in the last 6 months      Prior Functioning/Environment Level of Independence: Independent with assistive device(s)        Comments: per pt report        OT Problem List: Decreased strength;Pain;Decreased activity tolerance;Decreased safety awareness;Decreased knowledge of use of DME or AE;Impaired balance (sitting and/or standing);Decreased knowledge of precautions      OT Treatment/Interventions: Self-care/ADL training;Therapeutic exercise;Therapeutic activities;Cognitive remediation/compensation;Energy conservation;Patient/family education;DME and/or AE instruction;Balance training    OT Goals(Current goals can be found in the care plan section) Acute Rehab OT Goals Patient Stated Goal: to return home and decrease pain OT Goal Formulation: With patient Time For Goal Achievement: 06/25/20 Potential to Achieve Goals: Good ADL Goals Pt Will Perform Grooming: with modified independence;standing Pt Will Transfer to Toilet: with modified independence;ambulating Pt Will Perform Toileting - Clothing Manipulation and hygiene: with modified independence;sit to/from stand Pt Will Perform Tub/Shower Transfer: Shower transfer;ambulating;with supervision  OT Frequency: Min 1X/week   Barriers to D/C:    none known at this time          AM-PAC OT "6 Clicks" Daily Activity     Outcome Measure Help from another person eating meals?: None Help from another person taking care of personal grooming?: None Help from another person toileting, which includes using toliet, bedpan, or urinal?: A Lot Help from another person bathing (including washing, rinsing, drying)?: A Lot Help from another person to put on and taking off regular upper  body clothing?: None Help from another person to put on and taking off regular lower body clothing?: A Lot 6 Click Score: 18   End of Session Nurse Communication: Mobility status;Patient requests pain meds  Activity Tolerance: Patient tolerated treatment well Patient left: in bed;with call bell/phone within reach;with nursing/sitter in room  OT Visit Diagnosis: Muscle weakness (generalized) (M62.81);Pain;Unsteadiness on feet (R26.81) Pain - Right/Left: Left Pain - part of body: Knee                Time: 7628-3151 OT Time Calculation (min): 15 min Charges:  OT General Charges $OT Visit: 1 Visit OT Evaluation $OT Eval Low Complexity: 1 Low   Maricia Scotti Domingo Sep, MS, OTR/L , CBIS ascom [phone]  06/11/20, 11:10 AM   06/11/2020, 11:09 AM

## 2020-06-11 NOTE — Anesthesia Preprocedure Evaluation (Addendum)
Anesthesia Evaluation  Patient identified by MRN, date of birth, ID band Patient awake    Reviewed: Allergy & Precautions, H&P , NPO status , Patient's Chart, lab work & pertinent test results  History of Anesthesia Complications Negative for: history of anesthetic complications  Airway Mallampati: II  TM Distance: <3 FB Neck ROM: full    Dental  (+) Upper Dentures, Lower Dentures   Pulmonary asthma , neg sleep apnea, pneumonia, COPD, Current Smoker and Patient abstained from smoking.,  Presented with left knee infection, noted to have SOB and dx'd with PNA, receiving abx and wearing 2L O2 Wears 2L O2 at night at baseline, has asthma, thinks she has COPD but not sure Denies OSA    + wheezing      Cardiovascular (-) angina+CHF  (-) Past MI and (-) Cardiac Stents (-) dysrhythmias  Rhythm:regular Rate:Normal  1. Left ventricular ejection fraction, by visual estimation, is 40 to  45%. The left ventricle has mildly decreased function. Left ventricular  septal wall thickness was mildly increased. Mildly increased left  ventricular posterior wall thickness. There  is borderline left ventricular hypertrophy.  2. Left ventricular diastolic Doppler parameters are consistent with  impaired relaxation pattern of LV diastolic filling.  3. The left ventricle was not well visualized.  4. Global right ventricle has normal systolic function.The right  ventricular size is mildly enlarged. No increase in right ventricular wall  thickness.  5. Left atrial size was normal.  6. Right atrial size was mildly dilated.  7. The mitral valve was not well visualized. Trace mitral valve  regurgitation.  8. The tricuspid valve is grossly normal. Tricuspid valve regurgitation  is trivial.  9. The aortic valve was not well visualized Aortic valve regurgitation  was not visualized by color flow Doppler.  10. The pulmonic valve was not well  visualized. Pulmonic valve  regurgitation is trivial by color flow Doppler.  11. The aortic root was not well visualized.  12. The atrial septum is grossly normal.    Neuro/Psych  Headaches, PSYCHIATRIC DISORDERS Anxiety Depression Chronic pain fibromyalgia    GI/Hepatic Neg liver ROS, GERD  ,  Endo/Other  negative endocrine ROS  Renal/GU      Musculoskeletal   Abdominal   Peds  Hematology negative hematology ROS (+)   Anesthesia Other Findings Past Medical History: No date: Anxiety No date: Asthma No date: Chronic pain     Comment:  lower back 09/22/2019: Chronic systolic CHF (congestive heart failure) (HCC) No date: COPD (chronic obstructive pulmonary disease) (HCC) No date: Depression No date: DJD (degenerative joint disease) No date: Fibromyalgia No date: GERD (gastroesophageal reflux disease) No date: Headache No date: Hyperlipidemia No date: Lower extremity edema No date: On home oxygen therapy     Comment:  has not been on since 2016 No date: Panic attack No date: Shortness of breath dyspnea No date: Sleep disorder  Past Surgical History: No date: ABDOMINAL SURGERY No date: ANKLE SURGERY No date: BACK SURGERY     Comment:  lower No date: CARPAL TUNNEL RELEASE No date: CESAREAN SECTION No date: KNEE SURGERY No date: TONSILLECTOMY 04/14/2016: TOTAL KNEE ARTHROPLASTY; Left     Comment:  Procedure: TOTAL KNEE ARTHROPLASTY;  Surgeon: Deeann Saint, MD;  Location: [facility] ORS;  Service: Orthopedics;                Laterality: Left; No date: TUBAL LIGATION  BMI  Body Mass Index: 30.11 kg/m      Reproductive/Obstetrics negative OB ROS                            Anesthesia Physical Anesthesia Plan  ASA: III  Anesthesia Plan: General LMA   Post-op Pain Management:    Induction:   PONV Risk Score and Plan: Dexamethasone, Ondansetron and Treatment may vary due to age or medical condition  Airway Management  Planned:   Additional Equipment:   Intra-op Plan:   Post-operative Plan:   Informed Consent: I have reviewed the patients History and Physical, chart, labs and discussed the procedure including the risks, benefits and alternatives for the proposed anesthesia with the patient or authorized representative who has indicated his/her understanding and acceptance.     Dental Advisory Given  Plan Discussed with: Anesthesiologist, CRNA and Surgeon  Anesthesia Plan Comments: (Duoneb pre-op)       Anesthesia Quick Evaluation

## 2020-06-11 NOTE — Consult Note (Signed)
ORTHOPAEDIC CONSULTATION  REQUESTING PHYSICIAN: Darlin Priestly, MD  Chief Complaint:   Left knee pain.  History of Present Illness: Bethany Villa is a 64 y.o. female with multiple medical problems including home O2 dependent COPD, CHF, , hyperlipidemia, gastroesophageal reflux disease, anxiety/depression, and fibromyalgia who lives independently is now 4 years status post a left total knee arthroplasty performed by another orthopedic surgeon in the community.  The patient had done quite well following her total knee arthroplasty until she began to notice increased pain in her left knee 2 days ago.  When she got up yesterday, she was unable to bear weight on the leg, and so presented to the emergency room.  In the emergency room, her knee was aspirated by the ER provider and pus was encountered.  Therefore, the patient was admitted to the hospitalist and anticipation of more definitive treatment for her knee.  The patient also was diagnosed with a community-acquired pneumonia at the time of admission.  The patient denies any injury to the knee, and denies any fevers or chills.  She also denies any numbness or paresthesias down her leg to her foot.  Past Medical History:  Diagnosis Date  . Anxiety   . Asthma   . Chronic pain    lower back  . Chronic systolic CHF (congestive heart failure) (HCC) 09/22/2019  . COPD (chronic obstructive pulmonary disease) (HCC)   . Depression   . DJD (degenerative joint disease)   . Fibromyalgia   . GERD (gastroesophageal reflux disease)   . Headache   . Hyperlipidemia   . Lower extremity edema   . On home oxygen therapy    has not been on since 2016  . Panic attack   . Shortness of breath dyspnea   . Sleep disorder    Past Surgical History:  Procedure Laterality Date  . ABDOMINAL SURGERY    . ANKLE SURGERY    . BACK SURGERY     lower  . CARPAL TUNNEL RELEASE    . CESAREAN SECTION    . KNEE  SURGERY    . TONSILLECTOMY    . TOTAL KNEE ARTHROPLASTY Left 04/14/2016   Procedure: TOTAL KNEE ARTHROPLASTY;  Surgeon: Deeann Saint, MD;  Location: [facility] ORS;  Service: Orthopedics;  Laterality: Left;  . TUBAL LIGATION     Social History   Socioeconomic History  . Marital status: Divorced    Spouse name: Not on file  . Number of children: Not on file  . Years of education: Not on file  . Highest education level: Not on file  Occupational History  . Occupation: disabled  Tobacco Use  . Smoking status: Current Every Day Smoker    Packs/day: 1.00    Years: 45.00    Pack years: 45.00    Types: Cigarettes  . Smokeless tobacco: Never Used  Vaping Use  . Vaping Use: Never used  Substance and Sexual Activity  . Alcohol use: Yes    Alcohol/week: 0.0 standard drinks    Comment: rare  . Drug use: No  . Sexual activity: Yes    Birth control/protection: Surgical  Other Topics Concern  . Not on file  Social History Narrative  . Not on file   Social Determinants of Health   Financial Resource Strain:   . Difficulty of Paying Living Expenses: Not on file  Food Insecurity:   . Worried About Programme researcher, broadcasting/film/video in the Last Year: Not on file  . Ran Out of Food in the  Last Year: Not on file  Transportation Needs:   . Lack of Transportation (Medical): Not on file  . Lack of Transportation (Non-Medical): Not on file  Physical Activity:   . Days of Exercise per Week: Not on file  . Minutes of Exercise per Session: Not on file  Stress:   . Feeling of Stress : Not on file  Social Connections:   . Frequency of Communication with Friends and Family: Not on file  . Frequency of Social Gatherings with Friends and Family: Not on file  . Attends Religious Services: Not on file  . Active Member of Clubs or Organizations: Not on file  . Attends Banker Meetings: Not on file  . Marital Status: Not on file   Family History  Problem Relation Age of Onset  . Cancer Mother         beast   Allergies  Allergen Reactions  . Gabapentin Shortness Of Breath and Rash  . Mobic [Meloxicam] Anaphylaxis  . Penicillins Shortness Of Breath and Swelling    Has patient had a PCN reaction causing immediate rash, facial/tongue/throat swelling, SOB or lightheadedness with hypotension: unknown Has patient had a PCN reaction causing severe rash involving mucus membranes or skin necrosis: {unknown Has patient had a PCN reaction that required hospitalization {unknown Has patient had a PCN reaction occurring within the last 10 years: no If all of the above answers are "NO", then may proceed with Cephalosporin use.  . Sulfa Antibiotics Shortness Of Breath and Swelling   Prior to Admission medications   Medication Sig Start Date End Date Taking? Authorizing Provider  acetaminophen (TYLENOL) 500 MG tablet Take 2 tablets (1,000 mg total) by mouth every 12 (twelve) hours as needed for mild pain or moderate pain. 10/01/19 09/30/20 Yes Edward Jolly, MD  albuterol (PROVENTIL) (2.5 MG/3ML) 0.083% nebulizer solution TAKE 3 ML BY NEBULIZATION Q 6 hrs prn FOR WHEEZING OR SOB Dx J44.9 10/06/17  Yes Enedina Finner, MD  albuterol (VENTOLIN HFA) 108 (90 Base) MCG/ACT inhaler INHALE 2 PUFFS BY MOUTH EVERY 6 HOURS AS NEEDED FOR WHEEZE 10/20/18  Yes [provider]  atorvastatin (LIPITOR) 20 MG tablet TAKE 1 TABLET (20 MG TOTAL) BY MOUTH DAILY. 04/29/16  Yes Martin, Mary-Margaret, FNP  buprenorphine (BUTRANS) 20 MCG/HR PTWK Place 1 patch onto the skin every 7 (seven) days. 04/22/20 07/15/20 Yes Edward Jolly, MD  busPIRone (BUSPAR) 5 MG tablet Take 5 mg by mouth 3 (three) times daily.    Yes [provider]  Fluticasone-Umeclidin-Vilant (TRELEGY ELLIPTA) 100-62.5-25 MCG/INH AEPB Inhale 1 puff into the lungs daily. 07/11/19  Yes [provider]  furosemide (LASIX) 40 MG tablet Take 40 mg by mouth daily as needed.  07/11/19  Yes [provider]  ondansetron (ZOFRAN) 4 MG tablet Take 4  mg by mouth as needed for nausea/vomiting. 09/04/19  Yes [provider]  sertraline (ZOLOFT) 50 MG tablet Take 1 tablet (50 mg total) by mouth daily. Patient taking differently: Take 100 mg by mouth daily.  10/08/17  Yes Enedina Finner, MD  tiZANidine (ZANAFLEX) 4 MG tablet Take 1.5 tablets (6 mg total) by mouth 2 (two) times daily as needed for muscle spasms. 04/22/20 07/21/20 Yes Edward Jolly, MD  topiramate (TOPAMAX) 25 MG tablet Take 25 mg by mouth 2 (two) times daily. 08/02/19  Yes [provider]  traZODone (DESYREL) 50 MG tablet Take 1 tablet by mouth at bedtime. 05/01/20  Yes [provider]  ALPRAZolam Prudy Feeler) 0.5 MG tablet Take  0.5 mg by mouth 2 (two) times daily as needed. Patient not taking: Reported on 06/11/2020 07/25/19   [provider]  guaiFENesin-dextromethorphan (ROBITUSSIN DM) 100-10 MG/5ML syrup Take 5 mLs by mouth every 4 (four) hours as needed for cough. Patient not taking: Reported on 06/11/2020 06/13/19   Delfino LovettShah, Vipul, MD  phenol (CHLORASEPTIC) 1.4 % LIQD Use as directed 1 spray in the mouth or throat as needed for throat irritation / pain. Patient not taking: Reported on 06/11/2020 06/13/19   Delfino LovettShah, Vipul, MD   DG Chest 2 View  Result Date: 06/10/2020 CLINICAL DATA:  Wheezing. EXAM: CHEST - 2 VIEW COMPARISON:  October 08, 2019. FINDINGS: The heart size and mediastinal contours are within normal limits. No pneumothorax or pleural effusion is noted. Left lung is clear. New large right upper lobe airspace opacity is noted consistent pneumonia. The visualized skeletal structures are unremarkable. IMPRESSION: New large right upper lobe pneumonia. Electronically Signed   By: Lupita RaiderJames  Green Jr M.D.   On: 06/10/2020 19:43   DG Knee 2 Views Left  Result Date: 06/10/2020 CLINICAL DATA:  Knee pain EXAM: LEFT KNEE - 2 VIEW COMPARISON:  05/16/2018 FINDINGS: Left knee prosthesis is again seen and stable. No dislocation or acute fracture is noted. Small joint  effusion is seen. No other focal abnormality is noted. IMPRESSION: Small joint effusion without acute bony abnormality. Electronically Signed   By: Alcide CleverMark  Lukens M.D.   On: 06/10/2020 18:51    Positive ROS: All other systems have been reviewed and were otherwise negative with the exception of those mentioned in the HPI and as above.  Physical Exam: General:  Alert, no acute distress Psychiatric:  Patient is competent for consent with normal mood and affect   Cardiovascular:  No pedal edema Respiratory:  No wheezing, non-labored breathing GI:  Abdomen is soft and non-tender Skin:  No lesions in the area of chief complaint Neurologic:  Sensation intact distally Lymphatic:  No axillary or cervical lymphadenopathy  Orthopedic Exam:  Orthopedic examination is limited to the left knee and lower extremity.  Skin inspection around the left knee is notable for several well-healed surgical incisions.  There is perhaps mild erythema around the knee, as well as mild swelling, but no ecchymosis, abrasions, or other skin abnormalities are identified.  Has a 1+ effusion.  She has mild tenderness palpation diffusely around the knee.  She has pain with flexion of the knee beyond 60 degrees but can extend the knee fully.  She is neurovascularly intact to the left lower extremity and foot.  X-rays:  Recent x-rays of the left knee are available for review and have been reviewed by myself.  The findings are as described above.  Assessment: Acutely septic left total knee arthroplasty.  Plan: The treatment options, including both surgical and nonsurgical choices, have been discussed in detail with the patient.  The patient understands that we are attempting to try to salvage her knee before the infection gets to established, and that it is possible that she may need more aggressive intervention down the road. The risks (including bleeding, infection, nerve and/or blood vessel injury, persistent or recurrent pain,  loosening or failure of the components, need for further surgery, blood clots, strokes, heart attacks or arrhythmias, pneumonia, etc.) and benefits of the surgical procedure were discussed. The patient states his/her understanding and agrees to proceed.  She agrees to a blood transfusion if necessary. A formal written consent has been obtained.  Thank you for asking me to participate  in the care of this most pleasant yet unfortunate woman.  I will be happy to follow her with you.   Maryagnes Amos, MD  Beeper #:  [phone]  06/11/2020 12:41 PM

## 2020-06-11 NOTE — Op Note (Addendum)
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  Post-Op Diagnosis:   Same  Procedure:   Irrigation and debridement of acutely septic left total knee arthroplasty with polyethylene exchange.  Surgeon:   Maryagnes Amos, MD  Assistant:   Horris Latino, PA-C   Anesthesia:   GET  Findings:   As above  Complications:   None  EBL:   50 cc  Fluids:   800 cc crystalloid  UOP:   None  TT:   80 minutes at 300 mmHg  Drains:   None  Closure:   Staples  Implants:   DePuy LCS #3 tibial insert (10 mm standard plus) and standard plus mobile patella component.  Brief Clinical Note:   The patient is a 64 year old female who is now 4 years status post a left total knee arthroplasty. The patient was doing quite well in regards to her knee until 2 days ago when she began to notice increased pain and swelling in the knee without any antecedent injury. Yesterday, she became unable to weight-bear on the knee and presented to the emergency room where a knee aspiration demonstrated 34,000 white cells in the knee, suggestive of a septic total knee arthroplasty. The patient presents at this time for an irrigation and debridement with polyethylene exchange of her left total knee arthroplasty.  Procedure:   The patient was brought into the operating room and lain in the supine position. After adequate general endotracheal intubation and anesthesia was obtained, the left lower extremity was prepped with ChloraPrep solution and draped sterilely. Perioperative antibiotics had already been administered in the emergency room. After verifying the proper laterality with a surgical timeout, the limb was elevated for a minute before the tourniquet inflated to 300 mmHg.   Utilizing the previous incision, a standard anterior approach to the knee was made through an approximately 7 inch incision. The incision was carried down  through the subcutaneous tissues to expose superficial retinaculum. This was split the length of the incision and the medial flap elevated sufficiently to expose the medial retinaculum. The medial retinaculum was incised, leaving a 3-4 mm cuff of tissue on the patella. This was extended distally along the medial border of the patellar tendon and proximally through the medial third of the quadriceps tendon.  Abundant purulent fluid was immediately encountered and sample sent for culture. Several additional pieces of synovial tissue also were submitted for culture and sensitivity. The mobile polyethylene portion of the patella component was removed using 1/4 inch osteotome. The polyethylene component of the tibia also was removed using a 5/8 inch osteotome. Aggressive debridement was carried out throughout the knee. The femoral, tibial, and patellar components were inspected and felt to be well positioned and without evidence of loosening.  The wound was copiously irrigated with 6 L of sterile saline solution using the jet lavage system before the new 10 mm standard plus sized #3 tibial component and the new standard plus patellar button were reinserted. The knee was placed through a range of motion and demonstrated smooth patellofemoral motion with excellent patellar tracking. One package of antibiotic dissolvable beads impregnated with 1 g of vancomycin was distributed along the medial and lateral gutters and into the suprapatellar pouch at this point.    The quadriceps tendon and retinacular layer were reapproximated using #0 Vicryl interrupted sutures. One vial of activated collagen powder was distributed over the subcutaneous  tissues at this point to try to encourage healing and to minimize excessive scarring. The superficial retinacular layer was closed using a running #0 Vicryl suture. A total of 10 cc of transexemic acid (TXA) was injected intra-articularly before the subcutaneous tissues were closed in  several layers using 2-0 Vicryl interrupted sutures. The skin was closed using staples. A sterile honeycomb dressing was applied to the skin before the leg was wrapped with an Ace wrap to accommodate the Polar Care device. The patient was then awakened, extubated, and returned to the recovery room in satisfactory condition after tolerating the procedure well.

## 2020-06-12 ENCOUNTER — Encounter: Payer: Self-pay | Admitting: Surgery

## 2020-06-12 DIAGNOSIS — J9620 Acute and chronic respiratory failure, unspecified whether with hypoxia or hypercapnia: Secondary | ICD-10-CM

## 2020-06-12 DIAGNOSIS — T8450XA Infection and inflammatory reaction due to unspecified internal joint prosthesis, initial encounter: Secondary | ICD-10-CM | POA: Diagnosis not present

## 2020-06-12 DIAGNOSIS — B953 Streptococcus pneumoniae as the cause of diseases classified elsewhere: Secondary | ICD-10-CM | POA: Diagnosis not present

## 2020-06-12 DIAGNOSIS — J189 Pneumonia, unspecified organism: Secondary | ICD-10-CM | POA: Diagnosis not present

## 2020-06-12 DIAGNOSIS — R7881 Bacteremia: Secondary | ICD-10-CM | POA: Diagnosis not present

## 2020-06-12 LAB — CBC
HCT: 37.4 % (ref 36.0–46.0)
Hemoglobin: 11.7 g/dL — ABNORMAL LOW (ref 12.0–15.0)
MCH: 28.4 pg (ref 26.0–34.0)
MCHC: 31.3 g/dL (ref 30.0–36.0)
MCV: 90.8 fL (ref 80.0–100.0)
Platelets: 207 10*3/uL (ref 150–400)
RBC: 4.12 MIL/uL (ref 3.87–5.11)
RDW: 14.2 % (ref 11.5–15.5)
WBC: 17 10*3/uL — ABNORMAL HIGH (ref 4.0–10.5)
nRBC: 0 % (ref 0.0–0.2)

## 2020-06-12 LAB — BASIC METABOLIC PANEL
Anion gap: 8 (ref 5–15)
BUN: 35 mg/dL — ABNORMAL HIGH (ref 8–23)
CO2: 27 mmol/L (ref 22–32)
Calcium: 7.9 mg/dL — ABNORMAL LOW (ref 8.9–10.3)
Chloride: 103 mmol/L (ref 98–111)
Creatinine, Ser: 0.82 mg/dL (ref 0.44–1.00)
GFR, Estimated: >60 mL/min (ref >60)
Glucose, Bld: 128 mg/dL — ABNORMAL HIGH (ref 70–99)
Potassium: 3.7 mmol/L (ref 3.5–5.1)
Sodium: 138 mmol/L (ref 135–145)

## 2020-06-12 LAB — SEDIMENTATION RATE: Sed Rate: 70 mm/hr — ABNORMAL HIGH (ref 0–30)

## 2020-06-12 LAB — MAGNESIUM: Magnesium: 2.4 mg/dL (ref 1.7–2.4)

## 2020-06-12 LAB — C-REACTIVE PROTEIN: CRP: 19.6 mg/dL — ABNORMAL HIGH (ref <1.0)

## 2020-06-12 MED ORDER — PROPOFOL 500 MG/50ML IV EMUL
INTRAVENOUS | Status: AC
  Filled 2020-06-12: qty 50

## 2020-06-12 MED ORDER — LIDOCAINE HCL (PF) 2 % IJ SOLN
INTRAMUSCULAR | Status: AC
  Filled 2020-06-12: qty 5

## 2020-06-12 MED ORDER — PROPOFOL 500 MG/50ML IV EMUL
INTRAVENOUS | Status: AC
  Filled 2020-06-12: qty 100

## 2020-06-12 MED ORDER — PHENYLEPHRINE HCL (PRESSORS) 10 MG/ML IV SOLN
INTRAVENOUS | Status: AC
  Filled 2020-06-12: qty 1

## 2020-06-12 NOTE — Progress Notes (Signed)
ID  Says she has pain left leg Breathing better today   Patient Vitals for the past 24 hrs:  BP Temp Temp src Pulse Resp SpO2  06/12/20 1547 -- -- -- -- -- 95 %  06/12/20 1544 120/72 97.7 F (36.5 C) Oral 93 18 95 %  06/12/20 1146 -- -- -- -- -- 96 %  06/12/20 1130 133/74 97.7 F (36.5 C) Oral 79 18 96 %  06/12/20 0816 (!) 142/82 (!) 97.5 F (36.4 C) Oral 68 18 95 %  06/12/20 0804 -- -- -- -- -- 95 %  06/12/20 0437 125/77 97.6 F (36.4 C) Oral 73 20 96 %  06/11/20 2315 119/71 97.7 F (36.5 C) Oral 95 18 92 %  06/11/20 2144 122/65 97.6 F (36.4 C) Oral 100 16 94 %  06/11/20 2039 120/66 97.7 F (36.5 C) Oral 97 (!) 22 94 %  06/11/20 2019 -- -- -- -- -- 93 %  06/11/20 1933 131/80 (!) 97.5 F (36.4 C) Oral 91 18 95 %    O/e awake and alert Chest b/l rhonchi Hs s1s2 Abd soft Left knee- surgical site   Labs CBC Latest Ref Rng & Units 06/12/2020 06/11/2020 06/10/2020  WBC 4.0 - 10.5 K/uL 17.0(H) 13.4(H) 17.3(H)  Hemoglobin 12.0 - 15.0 g/dL 11.7(L) 13.5 15.1(H)  Hematocrit 36 - 46 % 37.4 42.4 47.6(H)  Platelets 150 - 400 K/uL 207 195 203    CMP Latest Ref Rng & Units 06/12/2020 06/11/2020 06/10/2020  Glucose 70 - 99 mg/dL 756(E) 332(R) 518(A)  BUN 8 - 23 mg/dL 41(Y) 22 20  Creatinine 0.44 - 1.00 mg/dL 6.06 3.01 6.01  Sodium 135 - 145 mmol/L 138 136 136  Potassium 3.5 - 5.1 mmol/L 3.7 3.6 3.7  Chloride 98 - 111 mmol/L 103 98 96(L)  CO2 22 - 32 mmol/L 27 27 22   Calcium 8.9 - 10.3 mg/dL 7.9(L) 8.5(L) 8.8(L)  Total Protein 6.5 - 8.1 g/dL - 6.6 -  Total Bilirubin 0.3 - 1.2 mg/dL - 1.3(H) -  Alkaline Phos 38 - 126 U/L - 117 -  AST 15 - 41 U/L - 32 -  ALT 0 - 44 U/L - 23 -    Impression/Recommendation ? 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 ___________________________________________________ Discussed with patient in detail Dr.Fitzgerald will follow this patient tomorrow

## 2020-06-12 NOTE — Progress Notes (Signed)
  Subjective: 1 Day Post-Op Procedure(s) (LRB): IRRIGATION AND DEBRIDEMENT KNEE WITH POLY EXCHANGE (N/A) Patient reports pain as mild.   Patient is well and denies any increase in SOB.  Being treated for pneumonia. PT and Care management to assist with discharge planning. Currently on 2L of O2. Fever: No recent fever Gastrointestinal:Negative for nausea and vomiting  Objective: Vital signs in last 24 hours: Temp:  [97.5 F (36.4 C)-97.7 F (36.5 C)] 97.7 F (36.5 C) (10/14 1544) Pulse Rate:  [68-93] 93 (10/14 1544) Resp:  [18-20] 18 (10/14 1544) BP: (120-142)/(72-82) 120/72 (10/14 1544) SpO2:  [95 %-96 %] 96 % (10/14 2024)  Intake/Output from previous day:  Intake/Output Summary (Last 24 hours) at 06/12/2020 2341 Last data filed at 06/12/2020 1500 Gross per 24 hour  Intake 499.7 ml  Output 301 ml  Net 198.7 ml    Intake/Output this shift: No intake/output data recorded.  Labs: Recent Labs    06/10/20 1818 06/11/20 0623 06/12/20 0335  HGB 15.1* 13.5 11.7*   Recent Labs    06/11/20 0623 06/12/20 0335  WBC 13.4* 17.0*  RBC 4.72 4.12  HCT 42.4 37.4  PLT 195 207   Recent Labs    06/11/20 0623 06/12/20 0335  NA 136 138  K 3.6 3.7  CL 98 103  CO2 27 27  BUN 22 35*  CREATININE 0.70 0.82  GLUCOSE 144* 128*  CALCIUM 8.5* 7.9*   Recent Labs    06/11/20 0623  INR 1.4*     EXAM General - Patient is Alert, Appropriate and Oriented Extremity - ABD soft Sensation intact distally Intact pulses distally Dorsiflexion/Plantar flexion intact Incision: dressing C/D/I No cellulitis present Dressing/Incision - clean, dry, no drainage, hemovac intact with minimal bloody drainage. Motor Function - intact, moving foot and toes well on exam.   Past Medical History:  Diagnosis Date  . Anxiety   . Asthma   . Chronic pain    lower back  . Chronic systolic CHF (congestive heart failure) (HCC) 09/22/2019  . COPD (chronic obstructive pulmonary disease) (HCC)   .  Depression   . DJD (degenerative joint disease)   . Fibromyalgia   . GERD (gastroesophageal reflux disease)   . Headache   . Hyperlipidemia   . Lower extremity edema   . On home oxygen therapy    has not been on since 2016  . Panic attack   . Shortness of breath dyspnea   . Sleep disorder     Assessment/Plan: 1 Day Post-Op Procedure(s) (LRB): IRRIGATION AND DEBRIDEMENT KNEE WITH POLY EXCHANGE (N/A) Active Problems:   CAP (community acquired pneumonia)  Estimated body mass index is 30.11 kg/m as calculated from the following:   Height as of this encounter: 5\' 3"  (1.6 m).   Weight as of this encounter: 77.1 kg. Advance diet Up with therapy   Labs reviewed, WBC up to 17.  Will recheck tomorrow. Up with therapy.  Begin working on BM. Cultures from surgery pending, continue Rocephin and Vancomycin.   Will plan on removing hemovac tomorrow. Patient will likely need SNF upon discharge.  DVT Prophylaxis - Lovenox and Foot Pumps Weight-Bearing as tolerated to left leg  J. , PA-C [facility] 06/12/2020, 4:41 pm

## 2020-06-12 NOTE — Progress Notes (Addendum)
PROGRESS NOTE    Bethany Villa  ZOX:[id number] DOB: 09-28-1955 DOA: 06/10/2020 PCP: [name], MD    Assessment & Plan:   Active Problems:   CAP (community acquired pneumonia)   Bethany Villa  is a 64 y.o. Caucasian female with a known history of asthma, dyslipidemia, fibromyalgia, GERD, COPD, depression and anxiety, who presented to the emergency room with acute onset of dyspnea which has been worsening over the last week with associated cough productive of clear sputum as well as wheezing with nausea without vomiting or abdominal pain.  She is having mild dizziness without headache or blurred vision or paresthesias or focal muscle weakness..  She is also been having mildly worsening left knee pain with swelling since earlier during the day.  She denied any insect bites or trauma.  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  She admits to fever and chills.  No chest pain or palpitations.   # Sepsis 2/2 infections mentioned below --pt presented with tachycardia, leukocytosis, elevated procal, and multiple sources of infection as below. --ID consulted --treat with abx as below  # Strep pneumoniae bacteremia due to right upper lobe PNA -CXR showed New large right upper lobe pneumonia.  Blood cx 2 of 2 positive for Strep pneumoniae. --2d echo no mention of vegetation. PLAN: --cont vanc/ceftriaxone, per ID --pt will need 6 weeks of abx anyways due to prosthetic joint infection, so no need to pursue further workup for endocarditis.  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  Did have PNA.  Started tx for COPD exacerbation on admission. PLAN: --continue steroid taper as prednisone 40 mg daily --continue DuoNeb QID  4.  Depression and anxiety. -on home Zoloft, Topamax, trazodone, BuSpar and Xanax --cont home regimen  5.  Dyslipidemia. -cont home statin   DVT prophylaxis: Lovenox SQ Code Status: Full code  Family Communication:  Status is: inpatient Dispo:   The patient is from: home Anticipated d/c is to: SNF Anticipated d/c date is: > 3 days Patient currently is not medically stable to d/c due to: septic joint, and bacteremia, on IV abx, pending cx results   Subjective and Interval History:  Pt complained of left knee pain which was controlled by PRN oxycodone.  Dyspnea improved.   Objective: Vitals:   06/12/20 1130 06/12/20 1146 06/12/20 1544 06/12/20 1547  BP: 133/74  120/72   Pulse: 79  93   Resp: 18  18   Temp: 97.7 F (36.5 C)  97.7 F (36.5 C)   TempSrc: Oral  Oral   SpO2: 96% 96% 95% 95%  Weight:      Height:        Intake/Output Summary (Last 24 hours) at 06/12/2020 1640 Last data filed at 06/12/2020 1500 Gross per 24 hour  Intake 511.92 ml  Output 300 ml  Net 211.92 ml   Filed Weights   06/10/20 1808 06/11/20 1138  Weight: 77.1 kg 77.1 kg    Examination:   Constitutional: NAD, AAOx3 HEENT: conjunctivae and lids normal, EOMI CV: No cyanosis.  RESP: mild wheezes, on RA Extremities: bloody fluid draining from left knee SKIN: warm, dry and intact Neuro: II - XII grossly intact.   Psych: Normal mood and affect.  Appropriate judgement and reason   Data Reviewed: I have personally reviewed following labs and imaging studies  CBC: Recent Labs  Lab 06/10/20 1818 06/11/20 0623 06/12/20 0335  WBC 17.3* 13.4* 17.0*  NEUTROABS  --  11.5*  --   HGB 15.1* 13.5 11.7*  HCT 47.6* 42.4 37.4  MCV 90.2 89.8 90.8  PLT 203 195 207   Basic Metabolic Panel: Recent  Labs  Lab 06/10/20 1929 06/11/20 0623 06/12/20 0335  NA 136 136 138  K 3.7 3.6 3.7  CL 96* 98 103  CO2 22 27 27   GLUCOSE 121* 144* 128*  BUN 20 22 35*  CREATININE 0.80 0.70 0.82  CALCIUM 8.8* 8.5* 7.9*  MG  --   --  2.4   GFR: Estimated Creatinine Clearance: 69.1 mL/min (by C-G formula based on SCr of 0.82 mg/dL). Liver Function Tests: Recent Labs  Lab 06/11/20 0623  AST 32  ALT 23  ALKPHOS 117  BILITOT 1.3*  PROT 6.6  ALBUMIN 2.4*   No results for input(s): LIPASE, AMYLASE in the last 168 hours. No results for input(s): AMMONIA in the last 168 hours. Coagulation Profile: Recent Labs  Lab 06/11/20 0623  INR 1.4*   Cardiac Enzymes: No results for input(s): CKTOTAL, CKMB, CKMBINDEX, TROPONINI in the last 168 hours. BNP (last 3 results) No results for input(s): PROBNP in the last 8760 hours. HbA1C: No results for input(s): HGBA1C in the last 72 hours. CBG: No results for input(s): GLUCAP in the last 168 hours. Lipid Profile: No results for input(s): CHOL, HDL, LDLCALC, TRIG, CHOLHDL, LDLDIRECT in the last 72 hours. Thyroid Function Tests: No results for input(s): TSH, T4TOTAL, FREET4, T3FREE, THYROIDAB in the last 72 hours. Anemia Panel: No results for input(s): VITAMINB12, FOLATE, FERRITIN, TIBC, IRON, RETICCTPCT in the last 72 hours. Sepsis Labs: Recent Labs  Lab 06/10/20 1929 06/10/20 2205  PROCALCITON 1.24  --   LATICACIDVEN  --  1.7    Recent Results (from the past 240 hour(s))  Blood culture (routine x 2)     Status: Abnormal (Preliminary result)   Collection Time: 06/10/20 10:00 PM   Specimen: BLOOD  Result Value Ref Range Status   Specimen Description   Final    BLOOD RIGHT HAND Performed at [facility], [address] [place], [place]    Special Requests   Final    BOTTLES DRAWN AEROBIC AND ANAEROBIC Blood Culture adequate volume Performed at [facility], [address] [place], [place]     Culture  Setup Time   Final    GRAM POSITIVE COCCI IN BOTH AEROBIC AND ANAEROBIC BOTTLES Organism ID to follow CRITICAL RESULT CALLED TO, READ BACK BY AND VERIFIED WITH: KAREN HAYES 06/11/20 0912 KLW Performed at [facility], [address] [place], [place]    Culture STREPTOCOCCUS PNEUMONIAE (A)  Final   Report Status PENDING  Incomplete  Blood Culture ID Panel (Reflexed)     Status: Abnormal   Collection Time: 06/10/20 10:00 PM  Result Value Ref Range Status   Enterococcus faecalis NOT DETECTED NOT DETECTED Final   Enterococcus Faecium NOT DETECTED NOT DETECTED Final   Listeria monocytogenes NOT DETECTED NOT DETECTED Final   Staphylococcus species NOT DETECTED NOT DETECTED Final   Staphylococcus aureus (BCID) NOT DETECTED NOT  DETECTED Final   Staphylococcus epidermidis NOT DETECTED NOT DETECTED Final   Staphylococcus lugdunensis NOT DETECTED NOT DETECTED Final   Streptococcus species DETECTED (A) NOT DETECTED Final    Comment: CRITICAL RESULT CALLED TO, READ BACK BY AND VERIFIED WITH: KAREN HAYES 06/11/20 0912 KLW    Streptococcus agalactiae NOT DETECTED NOT DETECTED Final   Streptococcus pneumoniae DETECTED (A) NOT DETECTED Final    Comment: CRITICAL RESULT CALLED TO, READ BACK BY AND VERIFIED WITH: KAREN HAYES 06/11/20 0912 KLW    Streptococcus pyogenes NOT DETECTED NOT DETECTED Final   A.calcoaceticus-baumannii NOT DETECTED NOT DETECTED Final   Bacteroides fragilis NOT DETECTED NOT DETECTED Final   Enterobacterales NOT DETECTED NOT DETECTED Final   Enterobacter cloacae complex NOT DETECTED NOT DETECTED Final   Escherichia coli NOT DETECTED NOT DETECTED Final   Klebsiella aerogenes NOT DETECTED NOT DETECTED Final   Klebsiella oxytoca NOT DETECTED NOT DETECTED Final   Klebsiella pneumoniae NOT DETECTED NOT DETECTED Final   Proteus species NOT DETECTED NOT DETECTED Final   Salmonella species NOT DETECTED NOT DETECTED Final   Serratia marcescens NOT  DETECTED NOT DETECTED Final   Haemophilus influenzae NOT DETECTED NOT DETECTED Final   Neisseria meningitidis NOT DETECTED NOT DETECTED Final   Pseudomonas aeruginosa NOT DETECTED NOT DETECTED Final   Stenotrophomonas maltophilia NOT DETECTED NOT DETECTED Final   Candida albicans NOT DETECTED NOT DETECTED Final   Candida auris NOT DETECTED NOT DETECTED Final   Candida glabrata NOT DETECTED NOT DETECTED Final   Candida krusei NOT DETECTED NOT DETECTED Final   Candida parapsilosis NOT DETECTED NOT DETECTED Final   Candida tropicalis NOT DETECTED NOT DETECTED Final   Cryptococcus neoformans/gattii NOT DETECTED NOT DETECTED Final    Comment: Performed at [facility], [address] [place], [place] [address]  Respiratory Panel by RT PCR (Flu A&B, Covid) - Nasopharyngeal Swab     Status: None   Collection Time: 06/10/20 10:05 PM   Specimen: Nasopharyngeal Swab  Result Value Ref Range Status   SARS Coronavirus 2 by RT PCR NEGATIVE NEGATIVE Final    Comment: (NOTE) SARS-CoV-2 target nucleic acids are NOT DETECTED.  The SARS-CoV-2 RNA is generally detectable in upper respiratoy specimens during the acute phase of infection. The lowest concentration of SARS-CoV-2 viral copies this assay can detect is 131 copies/mL. A negative result does not preclude SARS-Cov-2 infection and should not be used as the sole basis for treatment or other patient management decisions. A negative result may occur with  improper specimen collection/handling, submission of specimen other than nasopharyngeal swab, presence of viral mutation(s) within the areas targeted by this assay, and inadequate number of viral copies (<131 copies/mL). A negative result must be combined with clinical observations, patient history, and epidemiological information. The expected result is Negative.  Fact Sheet for Patients:  https://www.moore.com/  Fact Sheet for Healthcare Providers:   https://www.young.biz/  This test is no t yet approved or cleared by the [place] FDA and  has been authorized for detection and/or diagnosis of SARS-CoV-2 by FDA under an Emergency Use Authorization (EUA). This EUA will remain  in effect (meaning this test can be used) for the duration of the COVID-19 declaration under Section 564(b)(1) of the Act, 21 U.S.C. section 360bbb-3(b)(1), unless the authorization is terminated or revoked sooner.     Influenza A by PCR NEGATIVE NEGATIVE Final   Influenza B by PCR NEGATIVE NEGATIVE Final    Comment: (NOTE) The Xpert Xpress SARS-CoV-2/FLU/RSV assay is intended as  an aid in  the diagnosis of influenza from Nasopharyngeal swab specimens and  should not be used as a sole basis for treatment. Nasal washings and  aspirates are unacceptable for Xpert Xpress SARS-CoV-2/FLU/RSV  testing.  Fact Sheet for Patients: https://www.moore.com/  Fact Sheet for Healthcare Providers: https://www.young.biz/  This test is not yet approved or cleared by the [place] FDA and  has been authorized for detection and/or diagnosis of SARS-CoV-2 by  FDA under an Emergency Use Authorization (EUA). This EUA will remain  in effect (meaning this test can be used) for the duration of the  Covid-19 declaration under Section 564(b)(1) of the Act, 21  U.S.C. section 360bbb-3(b)(1), unless the authorization is  terminated or revoked. Performed at [facility], [address] [place], [place] [address]   Blood culture (routine x 2)     Status: Abnormal (Preliminary result)   Collection Time: 06/10/20 10:05 PM   Specimen: BLOOD  Result Value Ref Range Status   Specimen Description   Final    BLOOD LEFT [facility] Performed at [facility], [address] [place], [place] [address]    Special Requests   Final    BOTTLES DRAWN AEROBIC AND ANAEROBIC Blood Culture adequate volume Performed  at [facility], [address], [place], [place] [address]    Culture  Setup Time   Final    GRAM POSITIVE COCCI IN BOTH AEROBIC AND ANAEROBIC BOTTLES CRITICAL RESULT CALLED TO, READ BACK BY AND VERIFIED WITH: KAREN HAYES 06/11/20 0912 KLW Performed at [facility], [address] [place], [place] [address]    Culture STREPTOCOCCUS PNEUMONIAE (A)  Final   Report Status PENDING  Incomplete  Body fluid culture     Status: None (Preliminary result)   Collection Time: 06/10/20 10:05 PM   Specimen: KNEE; Body Fluid  Result Value Ref Range Status   Specimen Description   Final    KNEE RIGHT Performed at [facility], [address], [place], [place] [address]    Special Requests   Final    NONE Performed at [facility], [address] [place], [place] [address]    Gram Stain   Final    ABUNDANT WBC PRESENT, PREDOMINANTLY PMN FEW GRAM POSITIVE COCCI RESULT CALLED TO, READ BACK BY AND VERIFIED WITHSolon Augusta RN 06/11/20 0556 JDW Performed at [facility], [address] [place], [place] [address]    Culture FEW STREPTOCOCCUS PNEUMONIAE  Final   Report Status PENDING  Incomplete  Aerobic/Anaerobic Culture (surgical/deep wound)     Status: None (Preliminary result)   Collection Time: 06/11/20  1:40 PM   Specimen: PATH Soft tissue  Result Value Ref Range Status   Specimen Description   Final    WOUND Performed at [facility], [address] [place], [place] [address]    Special Requests   Final    LEFT KNEE WOUND CULTURE Performed at [facility], [address] [place], [place] [address]    Gram Stain   Final    FEW WBC PRESENT,BOTH PMN AND MONONUCLEAR RARE GRAM POSITIVE COCCI    Culture   Final    NO GROWTH < 24 HOURS Performed at [facility], [address] [place], [place] [address]    Report Status PENDING  Incomplete  Aerobic/Anaerobic Culture (surgical/deep wound)     Status: None  (Preliminary result)   Collection Time: 06/11/20  1:41 PM   Specimen: PATH  Soft tissue  Result Value Ref Range Status   Specimen Description   Final    TISSUE Performed at [facility], [address] [place], [place] [address]    Special Requests   Final    LEFT KNEE TISSUE Performed at [facility], [address] [place], [place] [address]    Gram Stain NO WBC SEEN NO ORGANISMS SEEN   Final   Culture   Final    NO GROWTH < 24 HOURS Performed at [facility], [address] [place], [place] [address]    Report Status PENDING  Incomplete      Radiology Studies: DG Chest 2 View  Result Date: 06/10/2020 CLINICAL DATA:  Wheezing. EXAM: CHEST - 2 VIEW COMPARISON:  October 08, 2019. FINDINGS: The heart size and mediastinal contours are within normal limits. No pneumothorax or pleural effusion is noted. Left lung is clear. New large right upper lobe airspace opacity is noted consistent pneumonia. The visualized skeletal structures are unremarkable. IMPRESSION: New large right upper lobe pneumonia. Electronically Signed   By: Lupita RaiderJames  Green Jr M.D.   On: 06/10/2020 19:43   DG Knee 2 Views Left  Result Date: 06/10/2020 CLINICAL DATA:  Knee pain EXAM: LEFT KNEE - 2 VIEW COMPARISON:  05/16/2018 FINDINGS: Left knee prosthesis is again seen and stable. No dislocation or acute fracture is noted. Small joint effusion is seen. No other focal abnormality is noted. IMPRESSION: Small joint effusion without acute bony abnormality. Electronically Signed   By: Alcide CleverMark  Lukens M.D.   On: 06/10/2020 18:51   DG Knee Left Port  Result Date: 06/11/2020 CLINICAL DATA:  Status post left knee debridement EXAM: PORTABLE LEFT KNEE - 1-2 VIEW COMPARISON:  06/10/2020 FINDINGS: The prosthesis is again identified and stable. Two surgical drains are noted in place. Multiple radiopaque antibiotic beads are noted surrounding the distal femur. IMPRESSION: Status post debridement and antibiotic  bead placement Electronically Signed   By: Alcide CleverMark  Lukens M.D.   On: 06/11/2020 17:31     Scheduled Meds: . atorvastatin  20 mg Oral q1800  . buprenorphine  2 patch Transdermal Weekly  . busPIRone  5 mg Oral TID  . docusate sodium  100 mg Oral BID  . enoxaparin (LOVENOX) injection  40 mg Subcutaneous Q24H  . guaiFENesin  600 mg Oral BID  . ipratropium-albuterol  3 mL Nebulization QID  . predniSONE  40 mg Oral Q breakfast  . sertraline  50 mg Oral Daily  . topiramate  25 mg Oral BID   Continuous Infusions: . cefTRIAXone (ROCEPHIN)  IV 2 g (06/12/20 1252)  . vancomycin 1,000 mg (06/12/20 [id number])     LOS: 2 days     Darlin Priestlyina Rayona Sardinha, MD [facility] If 7PM-7AM, please contact night-coverage 06/12/2020, 4:40 PM

## 2020-06-12 NOTE — Progress Notes (Signed)
Initial Nutrition Assessment  DOCUMENTATION CODES:   Obesity unspecified  INTERVENTION:  Ensure Enlive po BID, each supplement provides 350 kcal and 20 grams of protein (prefers chocolate)  MVI with minerals daily  Encouraged po intake of meals and supplements   NUTRITION DIAGNOSIS:   Inadequate oral intake related to decreased appetite (due to nausea and shortness of breath) as evidenced by per patient/family report.  GOAL:   Patient will meet greater than or equal to 90% of their needs   MONITOR:   Weight trends, PO intake, Supplement acceptance, Labs, Skin, I & O's  REASON FOR ASSESSMENT:   Consult Assessment of nutrition requirement/status  ASSESSMENT:  64 year old female with history of asthma, HLD, fibromyalgia, CHF, GERD, COPD, depression and anxiety presented with acute dyspnea that has been worsening over the past week with associated cough productive of clear sputum, wheezing, nausea as well as mildly worsening left knee pain with swelling s/p total knee arthroplasty a couple of years ago.  Pt admitted with strep pneumoniae bacteremia due to right upper lobe pna and is s/p OR washout of left knee prosthetic joint infection.   Patient awake, in bed this afternoon reports ongoing knee pain, says she is really needing to urinate. Lunch tray at beside with ~50% intake, she endorses decreased appetite over the past week due to feeling nauseas and short of breath. Pt enjoys breakfast meals, endorsed 100% po this morning, eats eggs, fruit, cereal, oatmeal at home, has a sandwich or fruit if she eats lunch. Pt reports dinner meals vary, says its hard to cook for one person. RD educated on importance of adequate nutrition. She is agreeable to drinking Ensure and prefers chocolate flavor. Pt asking for assistance with getting to the bathroom, RD left room and notified nursing.   Per chart, weights stable over the past 9 months.  Medications reviewed and include: Colace,  Mucinex, Prednisone, Rocephin, Vancomycin  Labs: BUN 35 (H), WBC 17.0 (H)  NUTRITION - FOCUSED PHYSICAL EXAM: Mild orbital fat depletion   Diet Order:   Diet Order            Diet regular Room service appropriate? Yes; Fluid consistency: Thin  Diet effective now                 EDUCATION NEEDS:   No education needs have been identified at this time  Skin:  Skin Assessment: Skin Integrity Issues: Skin Integrity Issues:: Incisions Incisions: closed; left knee  Last BM:  10/13  Height:   Ht Readings from Last 1 Encounters:  06/11/20 5\' 3"  (1.6 m)    Weight:   Wt Readings from Last 1 Encounters:  06/11/20 77.1 kg    BMI:  Body mass index is 30.11 kg/m.  Estimated Nutritional Needs:   Kcal:  1700-1900  Protein:  85-95  Fluid:  >/= 1.7 L/day   06/13/20, RD, LDN Clinical Nutrition After Hours/Weekend Pager # in Amion

## 2020-06-12 NOTE — Evaluation (Signed)
Physical Therapy Evaluation Patient Details Name: Bethany Villa MRN: [id number] DOB: 06/04/1956 Today's Date: 06/12/2020   History of Present Illness  Bethany Villa is a 64 y/o female who ws admitted with c/o knee pain/swelling and SOB with associated cough with clear sputum. Pt underwent L knee debridement with antibiotic bead placement on 10/13. 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  Clinical Impression  Pt lying in bed upon arrival to room. Pt required increased encouragement to participate secondary to reports of 10/10 L knee pain. Pt refuses to get up to chair and remained adamant about that throughout session. Pt performed therex in bed with min A for LLE then able to progress to supervision. Verbal and tactile cues required for correct technique of exercises. Pt required min A to perform supine to sit for LLE management. Increased height of bed slightly and pt able to stand with min A. Verbal cues for placement of hands/feet for improved efficiency of transfer. Once upright, CGA for balance. Pt took sidesteps to the L and required verbal cues for sequencing of task with RW. Pt fatigued and required seated rest break. Pt remained on 2L O2 with SpO2 remaining between 92-95%. Noted increased work of breathing with all activities. Pt presents with deficits in pain, strength, functional activity tolerance, balance, and functional mobility. Pt with poor tolerance to activity today secondary to reports of increased pain. Pt would benefit from skilled PT to address current deficits as pt's PLOF was independent. At this time, recommend STR secondary to decreased tolerance of activity to maximize functional mobility and safety/indpeendence with standing activities. Will update recommendations if pt makes progress and demonstrates improved ability and tolerance of functional mobility.    Follow Up Recommendations SNF;Supervision for  mobility/OOB    Equipment Recommendations  Rolling walker with 5" wheels;3in1 (PT)    Recommendations for Other Services       Precautions / Restrictions Precautions Precautions: Fall Restrictions Weight Bearing Restrictions: Yes LLE Weight Bearing: Weight bearing as tolerated      Mobility  Bed Mobility Overal bed mobility: Needs Assistance Bed Mobility: Supine to Sit;Sit to Supine     Supine to sit: Min assist Sit to supine: Supervision   General bed mobility comments: HOB elevated; min A for LLE management; no phsyical assistance during sit to supine  Transfers Overall transfer level: Needs assistance Equipment used: Rolling walker (2 wheeled) Transfers: Sit to/from Stand Sit to Stand: From elevated surface;Min assist         General transfer comment: Elevated height of bed x 2 inches; min A for sit <> stand transfer   Ambulation/Gait Ambulation/Gait assistance: Min guard Gait Distance (Feet): 3 Feet Assistive device: Rolling walker (2 wheeled)   Gait velocity: decreased   General Gait Details: Pt sidestepped 3 feet using RW with CGA; verbal cues for sequencing  Stairs            Wheelchair Mobility    Modified Rankin (Stroke Patients Only)       Balance Overall balance assessment: Needs assistance Sitting-balance support: Feet supported;Bilateral upper extremity supported Sitting balance-Leahy Scale: Good Sitting balance - Comments: BUE in tripod position; SBA for safety   Standing balance support: Bilateral upper extremity supported;During functional activity Standing balance-Leahy Scale: Fair Standing balance comment: CGA for balance with BUE on RW for support  Pertinent Vitals/Pain Pain Assessment: 0-10 Pain Score: 10-Worst pain ever Pain Location: L knee Pain Descriptors / Indicators: Aching;Discomfort;Guarding;Grimacing Pain Intervention(s): Monitored during session;Repositioned;Ice applied     Home Living Family/patient expects to be discharged to:: Private residence Living Arrangements: Alone Available Help at Discharge: Family;Available PRN/intermittently Type of Home: Apartment Home Access: Level entry     Home Layout: One level Home Equipment: Grab bars - toilet;Grab bars - tub/shower Additional Comments: Pt reports giving away all equipment previously owned from prior TKA. Pt denies falls in last 6 months, and (-) driver. Pt states bathroom is handicap accessible but reports toilet being low as she was unable to get up from it recently.    Prior Function Level of Independence: Independent         Comments: pt states that prior to knee beginning to hurt, she was indepenedent with ambulation/transfers without AD. Only within the last week, has she had difficulty and inability to ambulate due to L knee pain.     Hand Dominance   Dominant Hand: Right    Extremity/Trunk Assessment   Upper Extremity Assessment Upper Extremity Assessment: Generalized weakness (grossly 3+ to 4-/5 bilaterally)    Lower Extremity Assessment Lower Extremity Assessment: Generalized weakness;LLE deficits/detail (RLE grossly 4- to 4/5) LLE Deficits / Details: pt with decreased active movement/decreased strength LLE: Unable to fully assess due to pain LLE Sensation: WNL    Cervical / Trunk Assessment Cervical / Trunk Assessment: Normal  Communication   Communication: No difficulties  Cognition Arousal/Alertness: Awake/alert Behavior During Therapy: WFL for tasks assessed/performed Overall Cognitive Status: Within Functional Limits for tasks assessed                                 General Comments: Pt alert/oriented x 4.      General Comments      Exercises Other Exercises Other Exercises: in semi-supine: AP, QS, and hip ab/add x 10 on LLE; min A initially for ab/add then progressed to supervision   Assessment/Plan    PT Assessment Patient needs continued  PT services  PT Problem List Decreased strength;Decreased activity tolerance;Decreased balance;Decreased mobility;Cardiopulmonary status limiting activity;Pain;Decreased range of motion       PT Treatment Interventions DME instruction;Gait training;Functional mobility training;Therapeutic activities;Therapeutic exercise;Balance training;Patient/family education    PT Goals (Current goals can be found in the Care Plan section)  Acute Rehab PT Goals Patient Stated Goal: to decrease pain PT Goal Formulation: With patient Time For Goal Achievement: 06/26/20 Potential to Achieve Goals: Good    Frequency 7X/week   Barriers to discharge        Co-evaluation               AM-PAC PT "6 Clicks" Mobility  Outcome Measure Help needed turning from your back to your side while in a flat bed without using bedrails?: A Little Help needed moving from lying on your back to sitting on the side of a flat bed without using bedrails?: A Little Help needed moving to and from a bed to a chair (including a wheelchair)?: A Little Help needed standing up from a chair using your arms (e.g., wheelchair or bedside chair)?: A Little Help needed to walk in hospital room?: A Little Help needed climbing 3-5 steps with a railing? : A Lot 6 Click Score: 17    End of Session Equipment Utilized During Treatment: Gait belt;Oxygen;Other (comment) (2L O2) Activity Tolerance: Patient limited by fatigue;Patient  tolerated treatment well Patient left: in bed;with call bell/phone within reach;with bed alarm set;with SCD's reapplied Nurse Communication: Mobility status PT Visit Diagnosis: Unsteadiness on feet (R26.81);Muscle weakness (generalized) (M62.81);Pain Pain - Right/Left: Left Pain - part of body: Knee    Time: 2162-4469 PT Time Calculation (min) (ACUTE ONLY): 45 min   Charges:              Frederich Chick, SPT  Frederich Chick 06/12/2020, 2:51 PM

## 2020-06-12 NOTE — Progress Notes (Signed)
PT Cancellation Note  Patient Details Name: Bethany Villa MRN: [id number] DOB: [birth year]   Cancelled Treatment:    Reason Eval/Treat Not Completed: Other (comment) Pt order received and chart reviewed. Pt lying in bed and states "I can't do that right now." Pt reports that she is in 10/10 pain and she needs to rest before doing anything with physical therapy. Pt pressed call bell and requested pain medication while clinician in room. Will attempt at a later time.   Frederich Chick 06/12/2020, 9:28 AM

## 2020-06-13 DIAGNOSIS — J189 Pneumonia, unspecified organism: Secondary | ICD-10-CM | POA: Diagnosis not present

## 2020-06-13 LAB — CULTURE, BLOOD (ROUTINE X 2)
Special Requests: ADEQUATE
Special Requests: ADEQUATE

## 2020-06-13 LAB — URINALYSIS, COMPLETE (UACMP) WITH MICROSCOPIC
Bacteria, UA: NONE SEEN
Bilirubin Urine: NEGATIVE
Glucose, UA: NEGATIVE mg/dL
Hgb urine dipstick: NEGATIVE
Ketones, ur: NEGATIVE mg/dL
Leukocytes,Ua: NEGATIVE
Nitrite: NEGATIVE
Protein, ur: NEGATIVE mg/dL
Specific Gravity, Urine: 1.012 (ref 1.005–1.030)
pH: 6 (ref 5.0–8.0)

## 2020-06-13 LAB — BASIC METABOLIC PANEL
Anion gap: 7 (ref 5–15)
BUN: 21 mg/dL (ref 8–23)
CO2: 29 mmol/L (ref 22–32)
Calcium: 8.1 mg/dL — ABNORMAL LOW (ref 8.9–10.3)
Chloride: 101 mmol/L (ref 98–111)
Creatinine, Ser: 0.73 mg/dL (ref 0.44–1.00)
GFR, Estimated: >60 mL/min (ref >60)
Glucose, Bld: 153 mg/dL — ABNORMAL HIGH (ref 70–99)
Potassium: 3.6 mmol/L (ref 3.5–5.1)
Sodium: 137 mmol/L (ref 135–145)

## 2020-06-13 LAB — CBC
HCT: 38.3 % (ref 36.0–46.0)
Hemoglobin: 11.9 g/dL — ABNORMAL LOW (ref 12.0–15.0)
MCH: 28.7 pg (ref 26.0–34.0)
MCHC: 31.1 g/dL (ref 30.0–36.0)
MCV: 92.5 fL (ref 80.0–100.0)
Platelets: 235 10*3/uL (ref 150–400)
RBC: 4.14 MIL/uL (ref 3.87–5.11)
RDW: 14.2 % (ref 11.5–15.5)
WBC: 13.7 10*3/uL — ABNORMAL HIGH (ref 4.0–10.5)
nRBC: 0.2 % (ref 0.0–0.2)

## 2020-06-13 LAB — BODY FLUID CULTURE

## 2020-06-13 LAB — MAGNESIUM: Magnesium: 2.1 mg/dL (ref 1.7–2.4)

## 2020-06-13 MED ORDER — ENOXAPARIN SODIUM 40 MG/0.4ML ~~LOC~~ SOLN: 40.0000 mg | SUBCUTANEOUS | 0 refills | Status: DC

## 2020-06-13 MED ORDER — ENSURE ENLIVE PO LIQD
237.0000 mL | Freq: Two times a day (BID) | ORAL | Status: DC
  Administered 2020-06-13 – 2020-06-17 (×7): 237 mL via ORAL

## 2020-06-13 MED ORDER — OXYCODONE HCL 5 MG PO TABS
5.0000 mg | ORAL_TABLET | ORAL | 0 refills | Status: DC | PRN
Start: 2020-06-13 — End: 2020-06-17

## 2020-06-13 MED ORDER — ADULT MULTIVITAMIN W/MINERALS CH
1.0000 | ORAL_TABLET | Freq: Every day | ORAL | Status: DC
  Administered 2020-06-13 – 2020-06-17 (×5): 1 via ORAL
  Filled 2020-06-13 (×4): qty 1

## 2020-06-13 MED ORDER — ALBUTEROL SULFATE (2.5 MG/3ML) 0.083% IN NEBU
2.5000 mg | INHALATION_SOLUTION | RESPIRATORY_TRACT | Status: DC | PRN
  Administered 2020-06-13 – 2020-06-17 (×2): 2.5 mg via RESPIRATORY_TRACT
  Filled 2020-06-13 (×2): qty 3

## 2020-06-13 NOTE — Progress Notes (Signed)
PROGRESS NOTE    [name]  ZOX:[id number] DOB: April 02, 1956 DOA: 06/10/2020 PCP: [name], MD    Assessment & Plan:   Active Problems:   CAP (community acquired pneumonia)   Bethany Villa  is a 64 y.o. Caucasian female with a known history of asthma, dyslipidemia, fibromyalgia, GERD, COPD, depression and anxiety, who presented to the emergency room with acute onset of dyspnea which has been worsening over the last week with associated cough productive of clear sputum as well as wheezing with nausea without vomiting or abdominal pain.  She is having mild dizziness without headache or blurred vision or paresthesias or focal muscle weakness..  She is also been having mildly worsening left knee pain with swelling since earlier during the day.  She denied any insect bites or trauma.  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  She admits to fever and chills.  No chest pain or palpitations.   # Sepsis 2/2 infections mentioned below --pt presented with tachycardia, leukocytosis, elevated procal, and multiple sources of infection as below. --ID consulted --treat with abx as below  # Strep pneumoniae bacteremia due to right upper lobe PNA -CXR showed New large right upper lobe pneumonia.  Blood cx 2 of 2 positive for Strep pneumoniae. --started on ceftriaxone and vanc. --2d echo no mention of vegetation. PLAN: --cont ceftriaxone, will need 6 weeks of abx --d/c vanc --pt will need 6 weeks of abx anyways due to prosthetic joint infection, so no need to pursue further workup for endocarditis.  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  Did have PNA.  Started tx for COPD exacerbation on admission. PLAN: --continue steroid taper as prednisone 40 mg daily --continue DuoNeb QID  4.  Depression and anxiety. -on home Zoloft, Topamax, trazodone, BuSpar and Xanax --cont home regimen  5.  Dyslipidemia. -cont home statin  Chronic pain on chronic opioids --pt on Butrans patch at home, Rx confirmed --continue Butrans patch   DVT prophylaxis: Lovenox SQ Code Status: Full code  Family Communication:  Status is: inpatient Dispo:   The patient is from: home Anticipated d/c is to: SNF Anticipated d/c date is: 1-2 days Patient currently is not medically stable to d/c due to: will need repeat blood cx neg 3 days before discharge.   Subjective and Interval History:  Pt's knee pain improved.  No BM but doesn't want anything for it yet.  Dyspnea improved.   Objective: Vitals:   06/13/20 0404 06/13/20 0753 06/13/20 0754 06/13/20 1701  BP:  (!) 179/88  127/83  Pulse:  87  71  Resp:  17  17  Temp:  97.9 F (36.6 C)  98 F (36.7 C)  TempSrc:  Oral  Axillary  SpO2: 98% 94% 94% 97%  Weight:      Height:        Intake/Output Summary (Last 24 hours) at 06/13/2020 1800 Last data filed at 06/13/2020 0546 Gross per 24 hour  Intake --  Output 950 ml  Net -950 ml   Filed Weights   06/10/20 1808 06/11/20 1138  Weight: 77.1 kg 77.1 kg    Examination:  Constitutional: NAD, AAOx3 HEENT: conjunctivae and lids normal, EOMI CV: RRR, No cyanosis.   RESP: mild diffuse wheezes Extremities: left knee in cooling brace SKIN: warm, dry and intact Neuro: II - XII grossly intact.   Psych: Normal mood and affect.  Appropriate judgement and reason   Data Reviewed: I have personally reviewed following labs and imaging studies  CBC: Recent Labs  Lab 06/10/20 1818 06/11/20 0623 06/12/20 0335 06/13/20 0517  WBC 17.3* 13.4* 17.0* 13.7*   NEUTROABS  --  11.5*  --   --   HGB 15.1* 13.5 11.7* 11.9*  HCT 47.6* 42.4 37.4 38.3  MCV 90.2 89.8 90.8 92.5  PLT 203 195 207 235   Basic Metabolic Panel: Recent Labs  Lab 06/10/20 1929 06/11/20 0623 06/12/20 0335 06/13/20 0517  NA 136 136 138 137  K 3.7 3.6 3.7 3.6  CL 96* 98 103 101  CO2 22 27 27 29   GLUCOSE 121* 144* 128* 153*  BUN 20 22 35* 21  CREATININE 0.80 0.70 0.82 0.73  CALCIUM 8.8* 8.5* 7.9* 8.1*  MG  --   --  2.4 2.1   GFR: Estimated Creatinine Clearance: 70.8 mL/min (by C-G formula based on SCr of 0.73 mg/dL). Liver Function Tests: Recent Labs  Lab 06/11/20 0623  AST 32  ALT 23  ALKPHOS 117  BILITOT 1.3*  PROT 6.6  ALBUMIN 2.4*   No results for input(s): LIPASE, AMYLASE in the last 168 hours. No results for input(s): AMMONIA in the last 168 hours. Coagulation Profile: Recent Labs  Lab 06/11/20 0623  INR 1.4*   Cardiac Enzymes: No results for input(s): CKTOTAL, CKMB, CKMBINDEX, TROPONINI in the last 168 hours. BNP (last 3 results) No results for input(s): PROBNP in the last 8760 hours. HbA1C: No results for input(s): HGBA1C in the last 72 hours. CBG: No results for input(s): GLUCAP in the last 168 hours. Lipid Profile: No results for input(s): CHOL, HDL, LDLCALC, TRIG, CHOLHDL, LDLDIRECT in the last 72 hours. Thyroid Function Tests: No results for input(s): TSH, T4TOTAL, FREET4, T3FREE, THYROIDAB in the last 72 hours. Anemia Panel: No results for input(s): VITAMINB12, FOLATE, FERRITIN, TIBC, IRON, RETICCTPCT in the last 72 hours. Sepsis Labs: Recent Labs  Lab 06/10/20 1929 06/10/20 2205  PROCALCITON 1.24  --   LATICACIDVEN  --  1.7    Recent Results (from the past 240 hour(s))  Blood culture (routine x 2)     Status: Abnormal   Collection Time: 06/10/20 10:00 PM   Specimen: BLOOD  Result Value Ref Range Status   Specimen Description   Final    BLOOD RIGHT HAND Performed at [facility], [address]  [place], [place] [address]    Special Requests   Final    BOTTLES DRAWN AEROBIC AND ANAEROBIC Blood Culture adequate volume Performed at [facility], [address] [place], [place] [address]    Culture  Setup Time   Final    GRAM POSITIVE COCCI IN BOTH AEROBIC AND ANAEROBIC BOTTLES Organism ID to follow CRITICAL RESULT CALLED TO, READ BACK BY AND VERIFIED WITH: KAREN HAYES 06/11/20 0912 KLW Performed at [facility], [address] [place], [place] [address]    Culture STREPTOCOCCUS PNEUMONIAE (A)  Final   Report Status 06/13/2020 FINAL  Final   Organism ID, Bacteria STREPTOCOCCUS PNEUMONIAE  Final      Susceptibility   Streptococcus pneumoniae - MIC*    ERYTHROMYCIN <=0.12  SENSITIVE Sensitive     LEVOFLOXACIN 0.5 SENSITIVE Sensitive     VANCOMYCIN 0.5 SENSITIVE Sensitive     PENO - penicillin <=0.06      PENICILLIN (non-meningitis) <=0.06 SENSITIVE Sensitive     PENICILLIN (oral) <=0.06 SENSITIVE Sensitive     CEFTRIAXONE (non-meningitis) <=0.12 SENSITIVE Sensitive     * STREPTOCOCCUS PNEUMONIAE  Blood Culture ID Panel (Reflexed)     Status: Abnormal   Collection Time: 06/10/20 10:00 PM  Result Value Ref Range Status   Enterococcus faecalis NOT DETECTED NOT DETECTED Final   Enterococcus Faecium NOT DETECTED NOT DETECTED Final   Listeria monocytogenes NOT DETECTED NOT DETECTED Final   Staphylococcus species NOT DETECTED NOT DETECTED Final   Staphylococcus aureus (BCID) NOT DETECTED NOT DETECTED Final   Staphylococcus epidermidis NOT DETECTED NOT DETECTED Final   Staphylococcus lugdunensis NOT DETECTED NOT DETECTED Final   Streptococcus species DETECTED (A) NOT DETECTED Final    Comment: CRITICAL RESULT CALLED TO, READ BACK BY AND VERIFIED WITH: KAREN HAYES 06/11/20 0912 KLW    Streptococcus agalactiae NOT DETECTED NOT DETECTED Final   Streptococcus pneumoniae DETECTED (A) NOT DETECTED Final    Comment: CRITICAL RESULT CALLED TO, READ BACK BY AND VERIFIED  WITH: KAREN HAYES 06/11/20 0912 KLW    Streptococcus pyogenes NOT DETECTED NOT DETECTED Final   A.calcoaceticus-baumannii NOT DETECTED NOT DETECTED Final   Bacteroides fragilis NOT DETECTED NOT DETECTED Final   Enterobacterales NOT DETECTED NOT DETECTED Final   Enterobacter cloacae complex NOT DETECTED NOT DETECTED Final   Escherichia coli NOT DETECTED NOT DETECTED Final   Klebsiella aerogenes NOT DETECTED NOT DETECTED Final   Klebsiella oxytoca NOT DETECTED NOT DETECTED Final   Klebsiella pneumoniae NOT DETECTED NOT DETECTED Final   Proteus species NOT DETECTED NOT DETECTED Final   Salmonella species NOT DETECTED NOT DETECTED Final   Serratia marcescens NOT DETECTED NOT DETECTED Final   Haemophilus influenzae NOT DETECTED NOT DETECTED Final   Neisseria meningitidis NOT DETECTED NOT DETECTED Final   Pseudomonas aeruginosa NOT DETECTED NOT DETECTED Final   Stenotrophomonas maltophilia NOT DETECTED NOT DETECTED Final   Candida albicans NOT DETECTED NOT DETECTED Final   Candida auris NOT DETECTED NOT DETECTED Final   Candida glabrata NOT DETECTED NOT DETECTED Final   Candida krusei NOT DETECTED NOT DETECTED Final   Candida parapsilosis NOT DETECTED NOT DETECTED Final   Candida tropicalis NOT DETECTED NOT DETECTED Final   Cryptococcus neoformans/gattii NOT DETECTED NOT DETECTED Final    Comment: Performed at [facility], [address] [place], [place] [address]  Respiratory Panel by RT PCR (Flu A&B, Covid) - Nasopharyngeal Swab     Status: None   Collection Time: 06/10/20 10:05 PM   Specimen: Nasopharyngeal Swab  Result Value Ref Range Status   SARS Coronavirus 2 by RT PCR NEGATIVE NEGATIVE Final    Comment: (NOTE) SARS-CoV-2 target nucleic acids are NOT DETECTED.  The SARS-CoV-2 RNA is generally detectable in upper respiratoy specimens during the acute phase of infection. The lowest concentration of SARS-CoV-2 viral copies this assay can detect is 131 copies/mL. A  negative result does not preclude SARS-Cov-2 infection and should not be used as the sole basis for treatment or other patient management decisions. A negative result may occur with  improper specimen collection/handling, submission of specimen other than nasopharyngeal swab, presence of viral mutation(s) within the areas targeted by this assay, and inadequate number of viral copies (<131 copies/mL). A negative result must be combined with clinical observations,  patient history, and epidemiological information. The expected result is Negative.  Fact Sheet for Patients:  https://www.moore.com/  Fact Sheet for Healthcare Providers:  https://www.young.biz/  This test is no t yet approved or cleared by the [place] FDA and  has been authorized for detection and/or diagnosis of SARS-CoV-2 by FDA under an Emergency Use Authorization (EUA). This EUA will remain  in effect (meaning this test can be used) for the duration of the COVID-19 declaration under Section 564(b)(1) of the Act, 21 U.S.C. section 360bbb-3(b)(1), unless the authorization is terminated or revoked sooner.     Influenza A by PCR NEGATIVE NEGATIVE Final   Influenza B by PCR NEGATIVE NEGATIVE Final    Comment: (NOTE) The Xpert Xpress SARS-CoV-2/FLU/RSV assay is intended as an aid in  the diagnosis of influenza from Nasopharyngeal swab specimens and  should not be used as a sole basis for treatment. Nasal washings and  aspirates are unacceptable for Xpert Xpress SARS-CoV-2/FLU/RSV  testing.  Fact Sheet for Patients: https://www.moore.com/  Fact Sheet for Healthcare Providers: https://www.young.biz/  This test is not yet approved or cleared by the [place] FDA and  has been authorized for detection and/or diagnosis of SARS-CoV-2 by  FDA under an Emergency Use Authorization (EUA). This EUA will remain  in effect (meaning this test can  be used) for the duration of the  Covid-19 declaration under Section 564(b)(1) of the Act, 21  U.S.C. section 360bbb-3(b)(1), unless the authorization is  terminated or revoked. Performed at [facility], [address] [place], [place] [address]   Blood culture (routine x 2)     Status: Abnormal   Collection Time: 06/10/20 10:05 PM   Specimen: BLOOD  Result Value Ref Range Status   Specimen Description   Final    BLOOD LEFT [facility] Performed at [facility], [address], [place], [place] [address]    Special Requests   Final    BOTTLES DRAWN AEROBIC AND ANAEROBIC Blood Culture adequate volume Performed at [facility], [address] [place], [place] [address]    Culture  Setup Time   Final    GRAM POSITIVE COCCI IN BOTH AEROBIC AND ANAEROBIC BOTTLES CRITICAL RESULT CALLED TO, READ BACK BY AND VERIFIED WITH: KAREN HAYES 06/11/20 0912 KLW Performed at [facility], [address] [place], [place] [address]    Culture (A)  Final    STREPTOCOCCUS PNEUMONIAE SUSCEPTIBILITIES PERFORMED ON PREVIOUS CULTURE WITHIN THE LAST 5 DAYS. Performed at [facility], [address], [place], [place] [address]    Report Status 06/13/2020 FINAL  Final  Body fluid culture     Status: None   Collection Time: 06/10/20 10:05 PM   Specimen: KNEE; Body Fluid  Result Value Ref Range Status   Specimen Description   Final    KNEE RIGHT Performed at [facility], [address], [place], [place] [address]    Special Requests   Final    NONE Performed at [facility], [address] [place], [place] [address]    Gram Stain   Final    ABUNDANT WBC PRESENT, PREDOMINANTLY PMN FEW GRAM POSITIVE COCCI RESULT CALLED TO, READ BACK BY AND VERIFIED WITHSolon Augusta RN 06/11/20 0556 JDW Performed at [facility], [address], [place], [place] [address]    Culture FEW STREPTOCOCCUS PNEUMONIAE  Final   Report Status 06/13/2020 FINAL   Final   Organism ID, Bacteria STREPTOCOCCUS PNEUMONIAE  Final  Susceptibility   Streptococcus pneumoniae - MIC*    ERYTHROMYCIN <=0.12 SENSITIVE Sensitive     LEVOFLOXACIN 0.5 SENSITIVE Sensitive     VANCOMYCIN 0.5 SENSITIVE Sensitive     PENO - penicillin <=0.06      PENICILLIN (non-meningitis) <=0.06 SENSITIVE Sensitive     PENICILLIN (oral) <=0.06 SENSITIVE Sensitive     CEFTRIAXONE (non-meningitis) <=0.12 SENSITIVE Sensitive     * FEW STREPTOCOCCUS PNEUMONIAE  Aerobic/Anaerobic Culture (surgical/deep wound)     Status: None (Preliminary result)   Collection Time: 06/11/20  1:40 PM   Specimen: PATH Soft tissue  Result Value Ref Range Status   Specimen Description   Final    WOUND Performed at [facility], [address] [place], [place] [address]    Special Requests   Final    LEFT KNEE WOUND CULTURE Performed at [facility], [address] [place], [place] [address]    Gram Stain   Final    FEW WBC PRESENT,BOTH PMN AND MONONUCLEAR RARE GRAM POSITIVE COCCI Performed at [facility], [address] [place], [place] [address]    Culture   Final    RARE STREPTOCOCCUS PNEUMONIAE CULTURE REINCUBATED FOR BETTER GROWTH NO ANAEROBES ISOLATED; CULTURE IN PROGRESS FOR 5 DAYS    Report Status PENDING  Incomplete  Aerobic/Anaerobic Culture (surgical/deep wound)     Status: None (Preliminary result)   Collection Time: 06/11/20  1:41 PM   Specimen: PATH Soft tissue  Result Value Ref Range Status   Specimen Description   Final    TISSUE Performed at [facility], [address], [place], [place] [address]    Special Requests   Final    LEFT KNEE TISSUE Performed at [facility], [address] [place], [place] [address]    Gram Stain NO WBC SEEN NO ORGANISMS SEEN   Final   Culture   Final    NO GROWTH 2 DAYS NO ANAEROBES ISOLATED; CULTURE IN PROGRESS FOR 5 DAYS Performed at [facility], [address]  [place], [place] [address]    Report Status PENDING  Incomplete  CULTURE, BLOOD (ROUTINE X 2) w Reflex to ID Panel     Status: None (Preliminary result)   Collection Time: 06/13/20 12:26 AM   Specimen: BLOOD  Result Value Ref Range Status   Specimen Description BLOOD BLOOD RIGHT HAND  Final   Special Requests   Final    BOTTLES DRAWN AEROBIC AND ANAEROBIC Blood Culture results may not be optimal due to an excessive volume of blood received in culture bottles   Culture   Final    NO GROWTH < 12 HOURS Performed at [facility], [address] [place], [place] [address]    Report Status PENDING  Incomplete  CULTURE, BLOOD (ROUTINE X 2) w Reflex to ID Panel     Status: None (Preliminary result)   Collection Time: 06/13/20 12:33 AM   Specimen: BLOOD  Result Value Ref Range Status   Specimen Description BLOOD LEFT ANTECUBITAL  Final   Special Requests   Final    BOTTLES DRAWN AEROBIC AND ANAEROBIC Blood Culture results may not be optimal due to an excessive volume of blood received in culture bottles   Culture   Final    NO GROWTH < 12 HOURS Performed at [facility], [address], [place], [place] [address]    Report Status PENDING  Incomplete      Radiology Studies: No results  found.   Scheduled Meds: . atorvastatin  20 mg Oral q1800  . buprenorphine  2 patch Transdermal Weekly  . busPIRone  5 mg Oral TID  . docusate sodium  100 mg Oral BID  . enoxaparin (LOVENOX) injection  40 mg Subcutaneous Q24H  . feeding supplement  237 mL Oral BID BM  . guaiFENesin  600 mg Oral BID  . ipratropium-albuterol  3 mL Nebulization QID  . multivitamin with minerals  1 tablet Oral Daily  . predniSONE  40 mg Oral Q breakfast  . sertraline  50 mg Oral Daily  . topiramate  25 mg Oral BID   Continuous Infusions: . cefTRIAXone (ROCEPHIN)  IV 2 g (06/13/20 1257)     LOS: 3 days     Darlin Priestly, MD [facility] If 7PM-7AM, please contact night-coverage 06/13/2020,  6:00 PM

## 2020-06-13 NOTE — Progress Notes (Signed)
Infectious Disease Long Term IV Antibiotic Orders SAKIA SCHRIMPF [birth year]  Diagnosis: Strep PNA bacteremia, and PJI L knee  Culture results Strep PNA. Blood and joint  LABS Lab Results  Component Value Date   CREATININE 0.73 06/13/2020   Lab Results  Component Value Date   WBC 13.7 (H) 06/13/2020   HGB 11.9 (L) 06/13/2020   HCT 38.3 06/13/2020   MCV 92.5 06/13/2020   PLT 235 06/13/2020   Lab Results  Component Value Date   ESRSEDRATE 70 (H) 06/12/2020   Lab Results  Component Value Date   CRP 19.6 (H) 06/12/2020    Allergies:  Allergies  Allergen Reactions  . Gabapentin Shortness Of Breath and Rash  . Mobic [Meloxicam] Anaphylaxis  . Penicillins Shortness Of Breath and Swelling    Has patient had a PCN reaction causing immediate rash, facial/tongue/throat swelling, SOB or lightheadedness with hypotension: unknown Has patient had a PCN reaction causing severe rash involving mucus membranes or skin necrosis: {unknown Has patient had a PCN reaction that required hospitalization {unknown Has patient had a PCN reaction occurring within the last 10 years: no If all of the above answers are "NO", then may proceed with Cephalosporin use.  . Sulfa Antibiotics Shortness Of Breath and Swelling    Discharge antibiotics  Ceftriaxone 2 grams every        24       hours  PICC Care per protocol Labs weekly while on IV antibiotics -FAX weekly labs to [phone] CBC w diff   Comprehensive met panel CRP   Planned duration of antibiotics 6 weeks   Stop date 11/24/  Follow up clinic date 3 weeks with Dr Delaine Lame   Leonel Ramsay, MD

## 2020-06-13 NOTE — Discharge Instructions (Signed)
TOTAL KNEE REPLACEMENT POSTOPERATIVE DIRECTIONS  Knee Rehabilitation, Guidelines Following Surgery  Results after knee surgery are often greatly improved when you follow the exercise, range of motion and muscle strengthening exercises prescribed by your doctor. Safety measures are also important to protect the knee from further injury. Any time any of these exercises cause you to have increased pain or swelling in your knee joint, decrease the amount until you are comfortable again and slowly increase them. If you have problems or questions, call your caregiver or physical therapist for advice.   HOME CARE INSTRUCTIONS  Remove items at home which could result in a fall. This includes throw rugs or furniture in walking pathways.  ICE using the Polar Care unit to the affected knee every three hours for 30 minutes at a time and then as needed for pain and swelling.  Place a dry towel or pillow case over the knee before applying the Polar Care Unit.  Continue to use ice on the knee for pain and swelling from surgery. You may notice swelling that will progress down to the foot and ankle.  This is normal after surgery.  Elevate the leg when you are not up walking on it.   Continue to use the breathing machine which will help keep your temperature down.  It is common for your temperature to cycle up and down following surgery, especially at night when you are not up moving around and exerting yourself.  The breathing machine keeps your lungs expanded and your temperature down. Do not place pillow under knee, focus on keeping the knee straight while resting  DIET You may resume your previous home diet once your are discharged from the hospital.  DRESSING / WOUND CARE / SHOWERING You may change your dressing 3-5 days after surgery.  Then change the dressing every day with sterile gauze.  Please use good hand washing techniques before changing the dressing.  Do not use any lotions or creams on the incision  until instructed by your surgeon. You need to keep your wound dry after being discharged home.  Just keep the incision dry and apply a dry gauze dressing on daily. Change the surgical dressing only if needed and reapply a dry dressing each time.  ACTIVITY Walk with your walker as instructed. Use walker as long as suggested by your caregivers. Avoid periods of inactivity such as sitting longer than an hour when not asleep. This helps prevent blood clots.  You may resume a sexual relationship in one month or when given the OK by your doctor.  You may return to work once you are cleared by your doctor.  Do not drive a car for 6 weeks or until released by you surgeon.  Do not drive while taking narcotics.  WEIGHT BEARING Weight bearing as tolerated with assist device (walker, cane, etc) as directed, use it as long as suggested by your surgeon or therapist, typically at least 4-6 weeks.  POSTOPERATIVE CONSTIPATION PROTOCOL Constipation - defined medically as fewer than three stools per week and severe constipation as less than one stool per week.  One of the most common issues patients have following surgery is constipation.  Even if you have a regular bowel pattern at home, your normal regimen is likely to be disrupted due to multiple reasons following surgery.  Combination of anesthesia, postoperative narcotics, change in appetite and fluid intake all can affect your bowels.  In order to avoid complications following surgery, here are some recommendations in order to help   you during your recovery period.  Colace (docusate) - Pick up an over-the-counter form of Colace or another stool softener and take twice a day as long as you are requiring postoperative pain medications.  Take with a full glass of water daily.  If you experience loose stools or diarrhea, hold the colace until you stool forms back up.  If your symptoms do not get better within 1 week or if they get worse, check with your  doctor.  Dulcolax (bisacodyl) - Pick up over-the-counter and take as directed by the product packaging as needed to assist with the movement of your bowels.  Take with a full glass of water.  Use this product as needed if not relieved by Colace only.   MiraLax (polyethylene glycol) - Pick up over-the-counter to have on hand.  MiraLax is a solution that will increase the amount of water in your bowels to assist with bowel movements.  Take as directed and can mix with a glass of water, juice, soda, coffee, or tea.  Take if you go more than two days without a movement. Do not use MiraLax more than once per day. Call your doctor if you are still constipated or irregular after using this medication for 7 days in a row.  If you continue to have problems with postoperative constipation, please contact the office for further assistance and recommendations.  If you experience "the worst abdominal pain ever" or develop nausea or vomiting, please contact the office immediatly for further recommendations for treatment.  ITCHING  If you experience itching with your medications, try taking only a single pain pill, or even half a pain pill at a time.  You can also use Benadryl over the counter for itching or also to help with sleep.   TED HOSE STOCKINGS Wear the elastic stockings on both legs for six weeks following surgery during the day but you may remove then at night for sleeping.  MEDICATIONS See your medication summary on the "After Visit Summary" that the nursing staff will review with you prior to discharge.  You may have some home medications which will be placed on hold until you complete the course of blood thinner medication.  It is important for you to complete the blood thinner medication as prescribed by your surgeon.  Continue your approved medications as instructed at time of discharge.  PRECAUTIONS If you experience chest pain or shortness of breath - call 911 immediately for transfer to the  hospital emergency department.  If you develop a fever greater that 101 F, purulent drainage from wound, increased redness or drainage from wound, foul odor from the wound/dressing, or calf pain - CONTACT YOUR SURGEON.                                                   FOLLOW-UP APPOINTMENTS Make sure you keep all of your appointments after your operation with your surgeon and caregivers. You should call the office at the above phone number and make an appointment for approximately two weeks after the date of your surgery or on the date instructed by your surgeon outlined in the "After Visit Summary".   RANGE OF MOTION AND STRENGTHENING EXERCISES  Rehabilitation of the knee is important following a knee injury or an operation. After just a few days of immobilization, the muscles of the thigh which   control the knee become weakened and shrink (atrophy). Knee exercises are designed to build up the tone and strength of the thigh muscles and to improve knee motion. Often times heat used for twenty to thirty minutes before working out will loosen up your tissues and help with improving the range of motion but do not use heat for the first two weeks following surgery. These exercises can be done on a training (exercise) mat, on the floor, on a table or on a bed. Use what ever works the best and is most comfortable for you Knee exercises include:  Leg Lifts - While your knee is still immobilized in a splint or cast, you can do straight leg raises. Lift the leg to 60 degrees, hold for 3 sec, and slowly lower the leg. Repeat 10-20 times 2-3 times daily. Perform this exercise against resistance later as your knee gets better.  Quad and Hamstring Sets - Tighten up the muscle on the front of the thigh (Quad) and hold for 5-10 sec. Repeat this 10-20 times hourly. Hamstring sets are done by pushing the foot backward against an object and holding for 5-10 sec. Repeat as with quad sets.  Leg Slides: Lying on your back,  slowly slide your foot toward your buttocks, bending your knee up off the floor (only go as far as is comfortable). Then slowly slide your foot back down until your leg is flat on the floor again. Angel Wings: Lying on your back spread your legs to the side as far apart as you can without causing discomfort.  A rehabilitation program following serious knee injuries can speed recovery and prevent re-injury in the future due to weakened muscles. Contact your doctor or a physical therapist for more information on knee rehabilitation.   IF YOU ARE TRANSFERRED TO A SKILLED REHAB FACILITY If the patient is transferred to a skilled rehab facility following release from the hospital, a list of the current medications will be sent to the facility for the patient to continue.  When discharged from the skilled rehab facility, please have the facility set up the patient's Home Health Physical Therapy prior to being released. Also, the skilled facility will be responsible for providing the patient with their medications at time of release from the facility to include their pain medication, the muscle relaxants, and their blood thinner medication. If the patient is still at the rehab facility at time of the two week follow up appointment, the skilled rehab facility will also need to assist the patient in arranging follow up appointment in our office and any transportation needs.  MAKE SURE YOU:  Understand these instructions.  Get help right away if you are not doing well or get worse.    Pick up stool softner and laxative for home use following surgery while on pain medications. Do not submerge incision under water. Please use good hand washing techniques while changing dressing each day. May shower starting three days after surgery. Please use a clean towel to pat the incision dry following showers. Continue to use ice for pain and swelling after surgery. Do not use any lotions or creams on the incision until  instructed by your surgeon.  

## 2020-06-13 NOTE — Progress Notes (Signed)
ID No fevers, wbc 13. Still some cough, continued pain in knee   Patient Vitals for the past 24 hrs:  BP Temp Temp src Pulse Resp SpO2  06/13/20 0754 -- -- -- -- -- 94 %  06/13/20 0753 (!) 179/88 97.9 F (36.6 C) Oral 87 17 94 %  06/13/20 0404 -- -- -- -- -- 98 %  06/12/20 2356 134/78 97.9 F (36.6 C) Oral 88 17 98 %  06/12/20 2024 -- -- -- -- -- 96 %  06/12/20 1547 -- -- -- -- -- 95 %  06/12/20 1544 120/72 97.7 F (36.5 C) Oral 93 18 95 %    O/e awake and alert Chest b/l rhonchi Hs s1s2 Abd soft Left knee- surgical site   Labs CBC Latest Ref Rng & Units 06/13/2020 06/12/2020 06/11/2020  WBC 4.0 - 10.5 K/uL 13.7(H) 17.0(H) 13.4(H)  Hemoglobin 12.0 - 15.0 g/dL 11.9(L) 11.7(L) 13.5  Hematocrit 36 - 46 % 38.3 37.4 42.4  Platelets 150 - 400 K/uL 235 207 195    CMP Latest Ref Rng & Units 06/13/2020 06/12/2020 06/11/2020  Glucose 70 - 99 mg/dL 465(K) 812(X) 517(G)  BUN 8 - 23 mg/dL 21 01(V) 22  Creatinine 0.44 - 1.00 mg/dL 4.94 4.96 7.59  Sodium 135 - 145 mmol/L 137 138 136  Potassium 3.5 - 5.1 mmol/L 3.6 3.7 3.6  Chloride 98 - 111 mmol/L 101 103 98  CO2 22 - 32 mmol/L 29 27 27   Calcium 8.9 - 10.3 mg/dL 8.1(L) 7.9(L) 8.5(L)  Total Protein 6.5 - 8.1 g/dL - - 6.6  Total Bilirubin 0.3 - 1.2 mg/dL - - 1.3(H)  Alkaline Phos 38 - 126 U/L - - 117  AST 15 - 41 U/L - - 32  ALT 0 - 44 U/L - - 23    Impression/Recommendation Strep pneumoniae bacteremia due to rt upper lobe pneumonia Cont ceftriaxone for 6 weeks of antibiotics  - will not pursue endocarditis workup given extended abx course planned for prosthetic joint infection   Left knee prosthetic joint infection- acute S/p I/D and capsule exchange Will need minimum 6 weeks of IV antibiotic and then PO antibiotic for a total of 6 months as there is retained hardware  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

## 2020-06-13 NOTE — Care Management Important Message (Signed)
Important Message  Patient Details  Name: Bethany Villa MRN: [id number] Date of Birth: [birth year]   Medicare Important Message Given:  N/A - LOS <3 / Initial given by admissions  Initial Medicare IM reviewed with patient by Jennye Moccasin, Patient Access Associate on 06/12/2020 at 11:36am.   [name] 06/13/2020, 8:45 AM

## 2020-06-13 NOTE — TOC Initial Note (Signed)
Transition of Care [facility]) - Initial/Assessment Note    Patient Details  Name: Bethany Villa MRN: [id number] Date of Birth: [birth year]  [facility] CM/SW Contact:    Trenton Founds, RN Phone Number: 06/13/2020, 4:02 PM  Clinical Narrative:    RNCM spoke with patient by telephone for assessment. Patient is from home but reports that she doesn't feel she can go home this time like she did last time. She is agreeable to short term rehab placement this time and would prefer it be a "nice" facility. RNCM verified PASSR, completed FL-2 and initiated bed search.                Expected Discharge Plan: Skilled Nursing Facility Barriers to Discharge: No Barriers Identified   Patient Goals and CMS Choice     Choice offered to / list presented to : Patient  Expected Discharge Plan and Services Expected Discharge Plan: Skilled Nursing Facility     Post Acute Care Choice: Skilled Nursing Facility Living arrangements for the past 2 months: Single Family Home                                      Prior Living Arrangements/Services Living arrangements for the past 2 months: Single Family Home Lives with:: Self Patient language and need for interpreter reviewed:: Yes Do you feel safe going back to the place where you live?: Yes      Need for Family Participation in Patient Care: Yes (Comment) Care giver support system in place?: Yes (comment)   Criminal Activity/Legal Involvement Pertinent to Current Situation/Hospitalization: No - Comment as needed  Activities of Daily Living      Permission Sought/Granted                  Emotional Assessment       Orientation: : Oriented to Situation, Oriented to  Time, Oriented to Place, Oriented to Self Alcohol / Substance Use: Not Applicable Psych Involvement: No (comment)  Admission diagnosis:  Wheezing [R06.2] CAP (community acquired pneumonia) [J18.9] Acute pain of left knee [M25.562] Pneumonia due to  infectious organism, unspecified laterality, unspecified part of lung [J18.9] Patient Active Problem List   Diagnosis Date Noted  . Acute hypercapnic respiratory failure (HCC) 10/07/2019  . Altered mental status   . Chronic systolic CHF (congestive heart failure) (HCC) 09/22/2019  . Degeneration of cervical intervertebral disc 07/05/2019  . Bilateral primary osteoarthritis of knee 04/26/2019  . Chronic bilateral thoracic back pain 12/05/2018  . Cervicalgia 12/05/2018  . 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 Acute adjustment disorder with anxiety 10/06/2017  . Palliative care by specialist   . Respiratory failure (HCC) 10/01/2017  . COPD exacerbation (HCC) 09/01/2016  . Elevated brain natriuretic peptide (BNP) level 09/01/2016  . LFT elevation 09/01/2016  . 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 Chronic obstructive pulmonary disease with acute lower respiratory infection (HCC)   . Acute respiratory failure with hypoxia and hypercapnia (HCC)   . Sepsis (HCC) 09/03/2015  . CAP (community acquired pneumonia) 09/01/2015  . SIRS (systemic inflammatory response syndrome) (HCC) 07/24/2014  . COPD with acute exacerbation (HCC) 09/29/2013  . Acute on chronic respiratory failure with hypercapnia (HCC) 11/27/2012  . Insomnia 11/04/2012  . Generalized anxiety disorder 11/04/2012  . Depression 11/04/2012  . Fibromyalgia 11/04/2012  . Metabolic encephalopathy 10/03/2011  . COPD (chronic obstructive  pulmonary disease) (HCC) 06/01/2011  . Smoking 05/19/2011  . Obesity 05/19/2011  . Hyperglycemia 05/19/2011   PCP:  [name], MD Pharmacy:   [facility] [place], [place] - [address] [place] [address] Phone: [phone] Fax: [phone]  [facility] [address] [place] - [address] [place] [address] Phone: [phone] Fax: [phone]     Social Determinants of  Health (SDOH) Interventions    Readmission Risk Interventions No flowsheet data found.

## 2020-06-13 NOTE — [place] FL2 (Signed)
[place] MEDICAID FL2 LEVEL OF CARE SCREENING TOOL     IDENTIFICATION  Patient Name: Bethany Villa Birthdate: 07/05/1956 Sex: female Admission Date (Current Location): 06/10/2020  [place] and [place] Number:  Chiropodist and Address:  [facility], [address] [place], [place] [address]      Provider Number: [id number]  Attending Physician Name and Address:  Darlin Priestly, MD  Relative Name and Phone Number:  Alan Riles [phone]    Current Level of Care: Hospital Recommended Level of Care: Skilled Nursing Facility Prior Approval Number:    Date Approved/Denied:   PASRR Number: [id number]  Discharge Plan: SNF    Current Diagnoses: Patient Active Problem List   Diagnosis Date Noted  . Acute hypercapnic respiratory failure (HCC) 10/07/2019  . Altered mental status   . Chronic systolic CHF (congestive heart failure) (HCC) 09/22/2019  . Degeneration of cervical intervertebral disc 07/05/2019  . Bilateral primary osteoarthritis of knee 04/26/2019  . Chronic bilateral thoracic back pain 12/05/2018  . Cervicalgia 12/05/2018  . 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 Acute adjustment disorder with anxiety 10/06/2017  . Palliative care by specialist   . Respiratory failure (HCC) 10/01/2017  . COPD exacerbation (HCC) 09/01/2016  . Elevated brain natriuretic peptide (BNP) level 09/01/2016  . LFT elevation 09/01/2016  . 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 Chronic obstructive pulmonary disease with acute lower respiratory infection (HCC)   . Acute respiratory failure with hypoxia and hypercapnia (HCC)   . Sepsis (HCC) 09/03/2015  . CAP (community acquired pneumonia) 09/01/2015  . SIRS (systemic inflammatory response syndrome) (HCC) 07/24/2014  . COPD with acute exacerbation (HCC) 09/29/2013  . Acute on chronic respiratory failure with hypercapnia (HCC) 11/27/2012  . Insomnia 11/04/2012  .  Generalized anxiety disorder 11/04/2012  . Depression 11/04/2012  . Fibromyalgia 11/04/2012  . Metabolic encephalopathy 10/03/2011  . COPD (chronic obstructive pulmonary disease) (HCC) 06/01/2011  . Smoking 05/19/2011  . Obesity 05/19/2011  . Hyperglycemia 05/19/2011    Orientation RESPIRATION BLADDER Height & Weight     Self, Time, Situation, Place  Normal   Weight: 77.1 kg Height:  5\' 3"  (160 cm)  BEHAVIORAL SYMPTOMS/MOOD NEUROLOGICAL BOWEL NUTRITION STATUS      Continent Diet (Regular)  AMBULATORY STATUS COMMUNICATION OF NEEDS Skin   Extensive Assist Verbally Surgical wounds                       Personal Care Assistance Level of Assistance  Bathing, Feeding, Dressing Bathing Assistance: Maximum assistance Feeding assistance: Limited assistance Dressing Assistance: Maximum assistance     Functional Limitations Info  Sight, Hearing, Speech Sight Info: Adequate Hearing Info: Adequate Speech Info: Adequate    SPECIAL CARE FACTORS FREQUENCY  PT (By licensed PT), OT (By licensed OT)                    Contractures Contractures Info: Not present    Additional Factors Info  Code Status, Allergies Code Status Info: Full Allergies Info: Gabapentin, Mobic, PCN, Sulfa           Current Medications (06/13/2020):  This is the current hospital active medication list Current Facility-Administered Medications  Medication Dose Route Frequency Provider Last Rate Last Admin  . acetaminophen (TYLENOL) tablet 325-650 mg  325-650 mg Oral Q6H PRN Poggi, 06/15/2020, MD      . albuterol (PROVENTIL) (2.5 MG/3ML) 0.083% nebulizer solution 2.5 mg  2.5 mg Nebulization Q2H PRN Andris Baumann, MD   2.5 mg at 06/13/20 0403  . ALPRAZolam Prudy Feeler) tablet 0.5 mg  0.5 mg Oral BID PRN Poggi, Excell Seltzer, MD   0.5 mg at 06/13/20 1257  . atorvastatin (LIPITOR) tablet 20 mg  20 mg Oral q1800 Poggi, Excell Seltzer, MD   20 mg at 06/12/20 1731  . bisacodyl (DULCOLAX) suppository 10 mg  10 mg Rectal Daily  PRN Poggi, Excell Seltzer, MD      . buprenorphine Lavera Guise) 10 MCG/HR 2 patch  2 patch Transdermal Weekly Darlin Priestly, MD   2 patch at 06/12/20 1019  . busPIRone (BUSPAR) tablet 5 mg  5 mg Oral TID Christena Flake, MD   5 mg at 06/13/20 0950  . cefTRIAXone (ROCEPHIN) 2 g in sodium chloride 0.9 % 100 mL IVPB  2 g Intravenous Q24H Poggi, Excell Seltzer, MD 200 mL/hr at 06/13/20 1257 2 g at 06/13/20 1257  . chlorpheniramine-HYDROcodone (TUSSIONEX) 10-8 MG/5ML suspension 5 mL  5 mL Oral Q12H PRN Poggi, Excell Seltzer, MD   5 mL at 06/13/20 0114  . diphenhydrAMINE (BENADRYL) 12.5 MG/5ML elixir 12.5-25 mg  12.5-25 mg Oral Q4H PRN Poggi, Excell Seltzer, MD      . docusate sodium (COLACE) capsule 100 mg  100 mg Oral BID Poggi, Excell Seltzer, MD   100 mg at 06/13/20 1000  . enoxaparin (LOVENOX) injection 40 mg  40 mg Subcutaneous Q24H Poggi, Excell Seltzer, MD   40 mg at 06/12/20 2206  . feeding supplement (ENSURE ENLIVE / ENSURE PLUS) liquid 237 mL  237 mL Oral BID BM Darlin Priestly, MD      . guaiFENesin [facility]) 12 hr tablet 600 mg  600 mg Oral BID Christena Flake, MD   600 mg at 06/13/20 0950  . ipratropium-albuterol (DUONEB) 0.5-2.5 (3) MG/3ML nebulizer solution 3 mL  3 mL Nebulization QID Poggi, Excell Seltzer, MD   3 mL at 06/13/20 0754  . magnesium hydroxide (MILK OF MAGNESIA) suspension 30 mL  30 mL Oral Daily PRN Poggi, Excell Seltzer, MD      . magnesium hydroxide (MILK OF MAGNESIA) suspension 30 mL  30 mL Oral Daily PRN Poggi, Excell Seltzer, MD   30 mL at 06/12/20 0941  . metoCLOPramide (REGLAN) tablet 5-10 mg  5-10 mg Oral Q8H PRN Poggi, Excell Seltzer, MD       Or  . metoCLOPramide (REGLAN) injection 5-10 mg  5-10 mg Intravenous Q8H PRN Poggi, Excell Seltzer, MD      . morphine 2 MG/ML injection 2 mg  2 mg Intravenous Q4H PRN Darlin Priestly, MD   2 mg at 06/11/20 1725  . multivitamin with minerals tablet 1 tablet  1 tablet Oral Daily Darlin Priestly, MD      . ondansetron [facility]) tablet 4 mg  4 mg Oral Q6H PRN Poggi, Excell Seltzer, MD   4 mg at 06/13/20 0242   Or  . ondansetron (ZOFRAN) injection 4 mg   4 mg Intravenous Q6H PRN Poggi, Excell Seltzer, MD      . oxyCODONE (Oxy IR/ROXICODONE) immediate release tablet 5-10 mg  5-10 mg Oral Q4H PRN Poggi, Excell Seltzer, MD   5 mg at 06/13/20 1257  . predniSONE (DELTASONE) tablet 40 mg  40 mg Oral Q breakfast Poggi, Excell Seltzer, MD   40 mg at 06/13/20 0951  . sertraline (ZOLOFT) tablet 50 mg  50 mg Oral Daily Poggi, Excell Seltzer, MD   50 mg at 06/13/20 0951  .  sodium phosphate (FLEET) 7-19 GM/118ML enema 1 enema  1 enema Rectal Once PRN Poggi, Excell Seltzer, MD      . tiZANidine (ZANAFLEX) tablet 6 mg  6 mg Oral BID PRN Poggi, Excell Seltzer, MD      . topiramate (TOPAMAX) tablet 25 mg  25 mg Oral BID Poggi, Excell Seltzer, MD   25 mg at 06/13/20 1257  . traZODone (DESYREL) tablet 25 mg  25 mg Oral QHS PRN Poggi, Excell Seltzer, MD   25 mg at 06/12/20 2358     Discharge Medications: Please see discharge summary for a list of discharge medications.  Relevant Imaging Results:  Relevant Lab Results:   Additional Information SS# [id number]  Trenton Founds, RN

## 2020-06-13 NOTE — Progress Notes (Signed)
  Subjective: 2 Days Post-Op Procedure(s) (LRB): IRRIGATION AND DEBRIDEMENT KNEE WITH POLY EXCHANGE (N/A) Patient reports pain as mild.   Patient is well and denies any increase in SOB.  Being treated for pneumonia.  Complaining about frequent urination and discoloration. PT and Care management to assist with discharge planning. Fever: No recent fever Gastrointestinal:Negative for nausea and vomiting  Objective: Vital signs in last 24 hours: Temp:  [97.5 F (36.4 C)-97.9 F (36.6 C)] 97.9 F (36.6 C) (10/14 2356) Pulse Rate:  [68-93] 88 (10/14 2356) Resp:  [17-18] 17 (10/14 2356) BP: (120-142)/(72-82) 134/78 (10/14 2356) SpO2:  [95 %-98 %] 98 % (10/15 0404)  Intake/Output from previous day:  Intake/Output Summary (Last 24 hours) at 06/13/2020 0713 Last data filed at 06/13/2020 0546 Gross per 24 hour  Intake 300 ml  Output 951 ml  Net -651 ml    Intake/Output this shift: No intake/output data recorded.  Labs: Recent Labs    06/10/20 1818 06/11/20 0623 06/12/20 0335 06/13/20 0517  HGB 15.1* 13.5 11.7* 11.9*   Recent Labs    06/12/20 0335 06/13/20 0517  WBC 17.0* 13.7*  RBC 4.12 4.14  HCT 37.4 38.3  PLT 207 235   Recent Labs    06/12/20 0335 06/13/20 0517  NA 138 137  K 3.7 3.6  CL 103 101  CO2 27 29  BUN 35* 21  CREATININE 0.82 0.73  GLUCOSE 128* 153*  CALCIUM 7.9* 8.1*   Recent Labs    06/11/20 0623  INR 1.4*     EXAM General - Patient is Alert, Appropriate and Oriented Extremity - ABD soft Sensation intact distally Intact pulses distally Dorsiflexion/Plantar flexion intact Incision: dressing C/D/I No cellulitis present Dressing/Incision - clean, dry, no drainage, hemovac removed with no complication. Motor Function - intact, moving foot and toes well on exam.   Past Medical History:  Diagnosis Date  . Anxiety   . Asthma   . Chronic pain    lower back  . Chronic systolic CHF (congestive heart failure) (HCC) 09/22/2019  . COPD  (chronic obstructive pulmonary disease) (HCC)   . Depression   . DJD (degenerative joint disease)   . Fibromyalgia   . GERD (gastroesophageal reflux disease)   . Headache   . Hyperlipidemia   . Lower extremity edema   . On home oxygen therapy    has not been on since 2016  . Panic attack   . Shortness of breath dyspnea   . Sleep disorder     Assessment/Plan: 2 Days Post-Op Procedure(s) (LRB): IRRIGATION AND DEBRIDEMENT KNEE WITH POLY EXCHANGE (N/A) Active Problems:   CAP (community acquired pneumonia)  Estimated body mass index is 30.11 kg/m as calculated from the following:   Height as of this encounter: 5\' 3"  (1.6 m).   Weight as of this encounter: 77.1 kg. Advance diet Up with therapy   Labs reviewed, WBC 13.7, but slightly elevated.   Up with therapy.  Continue working on BM. Cultures from surgery pending, continue Rocephin and Vancomycin.   Urinalysis for urinary frequency and amber-colored urine. Patient will likely need SNF upon discharge.  DVT Prophylaxis - Lovenox and Foot Pumps Weight-Bearing as tolerated to left leg  PA-C [facility] 06/13/2020, 4:41 pm

## 2020-06-13 NOTE — Progress Notes (Signed)
Patient urinates very frequently. This is a concern she is worried about when she discharges because she is afraid she isn't going to make it to the bathroom on time. She had the purwick on throughout the night but her urine comes out very forcefully that her bed had to be changed several times. Patient stated she wanted a catheter placed because she was tired of having to get changed but I educated her on how the use of the [facility] and/or bedpan was a better option considering she is pod2 and the more she moves around the better. Patient ended up using the bedpan this morning a couple times and tolerated that well.

## 2020-06-13 NOTE — Progress Notes (Signed)
Physical Therapy Treatment Patient Details Name: Bethany Villa MRN: [id number] DOB: 1955/10/18 Today's Date: 06/13/2020    History of Present Illness Bethany Villa is a 64 y/o female who ws admitted with c/o knee pain/swelling and SOB with associated cough with clear sputum. Pt underwent L knee debridement with antibiotic bead placement on 10/13. 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    PT Comments    Pt lying in bed upon arrival to room and agreeable to session this morning. Pt reporting 3/10 pain at rest. Pt performed therex in bed prior to performing transfer to [facility]. Pt required min A for LLE hip ab/add exercise and mod A for LLE SLR secondary to pain and decreased strength. Pt required min-mod A for transfers from [facility] and bed respectively for boosting to stand, balance while transitioning hands from seated surface to RW, and for eccentric control on descent. Pt became tearful while sitting on [facility] stating that she was unable to get off of toilet at home. Pt required emotional support and assured that clinicina was there to help her and to teach her ways to become more independent and safe with mobility. Pt required verbal cues for hand and foot placement for improved efficiency of transfer. Pt ambulated 5 feet using RW with CGA for steadying and requires verbal cues and demonstration for sequencing and walker management. Pt with very slow movements throughout all activities and requires increased encouragement for continued participation. Pt remained on 2L O2 via nasal cannula throughout session and despite increased work of breathing/labored breathing remains above 95% with all activities. Pt left sitting in recliner chair and received pain medication. Discharge recommendation remains appropriate at this time.     Follow Up Recommendations  SNF;Supervision for mobility/OOB     Equipment Recommendations  Rolling walker  with 5" wheels;3in1 (PT)    Recommendations for Other Services       Precautions / Restrictions Precautions Precautions: Fall Restrictions Weight Bearing Restrictions: Yes LLE Weight Bearing: Weight bearing as tolerated    Mobility  Bed Mobility Overal bed mobility: Needs Assistance Bed Mobility: Supine to Sit     Supine to sit: Min assist     General bed mobility comments: HOB elevated; min A for LLE management only  Transfers Overall transfer level: Needs assistance Equipment used: Rolling walker (2 wheeled) Transfers: Sit to/from Stand Sit to Stand: Mod assist         General transfer comment: Mod A for sit <> stand from bed in lowest position and from [facility] for boosting to stand, balance while transitioning hands to RW, and for eccentric control on descent  Ambulation/Gait Ambulation/Gait assistance: Min guard Gait Distance (Feet): 5 Feet Assistive device: Rolling walker (2 wheeled) Gait Pattern/deviations: Step-to pattern Gait velocity: decreased   General Gait Details: pt with step-to pattern and required increased verbal cues for sequencing and walker management; CGA for steadying and safety   Stairs             Wheelchair Mobility    Modified Rankin (Stroke Patients Only)       Balance Overall balance assessment: Needs assistance Sitting-balance support: Feet supported;Bilateral upper extremity supported Sitting balance-Leahy Scale: Good Sitting balance - Comments: BUE in wide tripod position; SBA for safety   Standing balance support: Bilateral upper extremity supported;During functional activity Standing balance-Leahy Scale: Fair Standing balance comment: CGA for balance with BUE on RW for support  Cognition Arousal/Alertness: Awake/alert Behavior During Therapy: WFL for tasks assessed/performed Overall Cognitive Status: Within Functional Limits for tasks assessed                                         Exercises Other Exercises Other Exercises: with HOB elevated: LLE AP, QS, and hip ab/add x 12 reps, SLR and heel slides with limited knee flexion x 12 reps    General Comments        Pertinent Vitals/Pain Pain Assessment: 0-10 Pain Score: 7  (3/10 at rest increasing to 7/10 with activity) Pain Location: L knee Pain Descriptors / Indicators: Aching;Discomfort;Guarding;Grimacing Pain Intervention(s): Monitored during session;Repositioned;Ice applied    Home Living                      Prior Function            PT Goals (current goals can now be found in the care plan section) Acute Rehab PT Goals Patient Stated Goal: to decrease pain PT Goal Formulation: With patient Time For Goal Achievement: 06/26/20 Potential to Achieve Goals: Good Progress towards PT goals: Progressing toward goals    Frequency    7X/week      PT Plan Current plan remains appropriate    Co-evaluation              AM-PAC PT "6 Clicks" Mobility   Outcome Measure  Help needed turning from your back to your side while in a flat bed without using bedrails?: A Little Help needed moving from lying on your back to sitting on the side of a flat bed without using bedrails?: A Little Help needed moving to and from a bed to a chair (including a wheelchair)?: A Little Help needed standing up from a chair using your arms (e.g., wheelchair or bedside chair)?: A Little Help needed to walk in hospital room?: A Little Help needed climbing 3-5 steps with a railing? : A Lot 6 Click Score: 17    End of Session Equipment Utilized During Treatment: Gait belt;Oxygen Activity Tolerance: Patient limited by fatigue;Patient tolerated treatment well Patient left: in chair;with call bell/phone within reach;with chair alarm set;with SCD's reapplied Nurse Communication: Mobility status PT Visit Diagnosis: Unsteadiness on feet (R26.81);Muscle weakness (generalized) (M62.81);Pain Pain  - Right/Left: Left Pain - part of body: Knee     Time: 0910-0956 PT Time Calculation (min) (ACUTE ONLY): 46 min  Charges:                       Frederich Chick, SPT   Frederich Chick 06/13/2020, 10:43 AM

## 2020-06-13 NOTE — Progress Notes (Signed)
PHARMACY CONSULT NOTE FOR:  OUTPATIENT  PARENTERAL ANTIBIOTIC THERAPY (OPAT)  Indication:S. Pneumoniae bacteremia and PJI Regimen: ceftriaxone 2gm IV q24h  End date:  07/23/2020  IV antibiotic discharge orders are pended. To discharging provider:  please sign these orders via discharge navigator,  Select New Orders & click on the button choice - Manage This Unsigned Work.     Thank you for allowing pharmacy to be a part of this patient's care.  Juliette Alcide, PharmD, BCPS.   Work Cell: [phone] 06/13/2020 3:32 PM

## 2020-06-14 DIAGNOSIS — J189 Pneumonia, unspecified organism: Secondary | ICD-10-CM | POA: Diagnosis not present

## 2020-06-14 LAB — BASIC METABOLIC PANEL WITH GFR
Anion gap: 10 (ref 5–15)
BUN: 17 mg/dL (ref 8–23)
CO2: 32 mmol/L (ref 22–32)
Calcium: 8.8 mg/dL — ABNORMAL LOW (ref 8.9–10.3)
Chloride: 100 mmol/L (ref 98–111)
Creatinine, Ser: 0.6 mg/dL (ref 0.44–1.00)
GFR, Estimated: >60 mL/min
Glucose, Bld: 80 mg/dL (ref 70–99)
Potassium: 3.6 mmol/L (ref 3.5–5.1)
Sodium: 142 mmol/L (ref 135–145)

## 2020-06-14 LAB — CBC
HCT: 43.3 % (ref 36.0–46.0)
Hemoglobin: 13.2 g/dL (ref 12.0–15.0)
MCH: 28.3 pg (ref 26.0–34.0)
MCHC: 30.5 g/dL (ref 30.0–36.0)
MCV: 92.9 fL (ref 80.0–100.0)
Platelets: 244 10*3/uL (ref 150–400)
RBC: 4.66 MIL/uL (ref 3.87–5.11)
RDW: 14.3 % (ref 11.5–15.5)
WBC: 11.4 10*3/uL — ABNORMAL HIGH (ref 4.0–10.5)
nRBC: 0.2 % (ref 0.0–0.2)

## 2020-06-14 LAB — MAGNESIUM: Magnesium: 2.1 mg/dL (ref 1.7–2.4)

## 2020-06-14 MED ORDER — OXYCODONE HCL 5 MG PO TABS
5.0000 mg | ORAL_TABLET | ORAL | Status: DC | PRN
  Administered 2020-06-14 – 2020-06-17 (×10): 5 mg via ORAL
  Filled 2020-06-14 (×10): qty 1

## 2020-06-14 MED ORDER — IPRATROPIUM-ALBUTEROL 0.5-2.5 (3) MG/3ML IN SOLN
3.0000 mL | Freq: Three times a day (TID) | RESPIRATORY_TRACT | Status: DC
  Administered 2020-06-14 – 2020-06-17 (×7): 3 mL via RESPIRATORY_TRACT
  Filled 2020-06-14 (×10): qty 3

## 2020-06-14 MED ORDER — POLYETHYLENE GLYCOL 3350 17 G PO PACK
34.0000 g | PACK | Freq: Two times a day (BID) | ORAL | Status: DC
  Administered 2020-06-14 – 2020-06-16 (×2): 34 g via ORAL
  Filled 2020-06-14 (×6): qty 2

## 2020-06-14 NOTE — Plan of Care (Signed)
Pt continue to complain of pain - is sleeping peacefully thru the day.  Performed minimally with therapy.

## 2020-06-14 NOTE — Progress Notes (Signed)
PROGRESS NOTE    Bethany Villa  UEA:[id number] DOB: June 02, 1956 DOA: 06/10/2020 PCP: [name], MD    Assessment & Plan:   Active Problems:   CAP (community acquired pneumonia)   Bethany Villa  is a 64 y.o. Caucasian female with a known history of asthma, dyslipidemia, fibromyalgia, GERD, COPD, depression and anxiety, who presented to the emergency room with acute onset of dyspnea which has been worsening over the last week with associated cough productive of clear sputum as well as wheezing with nausea without vomiting or abdominal pain.  She is having mild dizziness without headache or blurred vision or paresthesias or focal muscle weakness..  She is also been having mildly worsening left knee pain with swelling since earlier during the day.  She denied any insect bites or trauma.  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  She admits to fever and chills.  No chest pain or palpitations.   # Sepsis 2/2 infections mentioned below --pt presented with tachycardia, leukocytosis, elevated procal, and multiple sources of infection as below. --ID consulted --treat with abx as below  # Strep pneumoniae bacteremia due to right upper lobe PNA -CXR showed New large right upper lobe pneumonia.  Blood cx 2 of 2 positive for Strep pneumoniae. --started on ceftriaxone and vanc, vanc since d/c'ed. --2d echo no mention of vegetation. PLAN: --cont ceftriaxone --will need to order PICC line --pt will need 6 weeks of abx anyways due to prosthetic joint infection, so no need to pursue further workup for endocarditis.  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  Did have PNA.  Started tx for COPD exacerbation on admission. PLAN: --continue steroid taper as prednisone 40 mg daily for 4 doses --continue DuoNeb QID --wean O2  4.  Depression and anxiety. -on home Zoloft, Topamax, trazodone, BuSpar and Xanax --cont curreng regimen  5.  Dyslipidemia. -cont home statin  Chronic pain on chronic opioids --pt on Butrans patch at home, Rx confirmed --continue Butrans patch   DVT prophylaxis: Lovenox SQ Code Status: Full code  Family Communication:  Status is: inpatient Dispo:   The patient is from: home Anticipated d/c is to: SNF Anticipated d/c date is: 1-2 days Patient currently is not medically stable to d/c due to: will need repeat blood cx neg 3 days before discharge.   Subjective and Interval History:  Pt felt she has been urinating a lot more.  Still complained of left knee pain.     Objective: Vitals:   06/13/20 2029 06/13/20 2355 06/14/20 0816 06/14/20 1551  BP:  (!) 144/80 129/80 116/73  Pulse:  89 (!) 110 (!) 109  Resp:  Temp:  98.2 F (36.8 C)  98.1 F (36.7 C)  TempSrc:  Oral    SpO2: 96% 99% 100% 98%  Weight:      Height:       No intake or output data in the 24 hours ending 06/14/20 1729 Filed Weights   06/10/20 1808 06/11/20 1138  Weight: 77.1 kg 77.1 kg    Examination:  Constitutional: NAD, AAOx3 HEENT: conjunctivae and lids normal, EOMI CV: No cyanosis.   RESP: No wheezes today Extremities: Left knee in cooling brace SKIN: warm, dry and intact Neuro: II - XII grossly intact.   Psych: Normal mood and affect.     Data Reviewed: I have personally reviewed following labs and imaging studies  CBC: Recent Labs  Lab 06/10/20 1818 06/11/20 0623 06/12/20 0335 06/13/20 0517 06/14/20 0408  WBC 17.3* 13.4* 17.0* 13.7* 11.4*  NEUTROABS  --  11.5*  --   --   --   HGB 15.1* 13.5 11.7* 11.9* 13.2   HCT 47.6* 42.4 37.4 38.3 43.3  MCV 90.2 89.8 90.8 92.5 92.9  PLT 203 195 207 235 244   Basic Metabolic Panel: Recent Labs  Lab 06/10/20 1929 06/11/20 0623 06/12/20 0335 06/13/20 0517 06/14/20 0408  NA 136 136 138 137 142  K 3.7 3.6 3.7 3.6 3.6  CL 96* 98 103 101 100  CO2 32  GLUCOSE 121* 144* 128* 153* 80  BUN 20 22 35* 21 17  CREATININE 0.80 0.70 0.82 0.73 0.60  CALCIUM 8.8* 8.5* 7.9* 8.1* 8.8*  MG  --   --  2.4 2.1 2.1   GFR: Estimated Creatinine Clearance: 70.8 mL/min (by C-G formula based on SCr of 0.6 mg/dL). Liver Function Tests: Recent Labs  Lab 06/11/20 0623  AST 32  ALT 23  ALKPHOS 117  BILITOT 1.3*  PROT 6.6  ALBUMIN 2.4*   No results for input(s): LIPASE, AMYLASE in the last 168 hours. No results for input(s): AMMONIA in the last 168 hours. Coagulation Profile: Recent Labs  Lab 06/11/20 0623  INR 1.4*   Cardiac Enzymes: No results for input(s): CKTOTAL, CKMB, CKMBINDEX, TROPONINI in the last 168 hours. BNP (last 3 results) No results for input(s): PROBNP in the last 8760 hours. HbA1C: No results for input(s): HGBA1C in the last 72 hours. CBG: No results for input(s): GLUCAP in the last 168 hours. Lipid Profile: No results for input(s): CHOL, HDL, LDLCALC, TRIG, CHOLHDL, LDLDIRECT in the last 72 hours. Thyroid Function Tests: No results for input(s): TSH, T4TOTAL, FREET4, T3FREE, THYROIDAB in the last 72 hours. Anemia Panel: No results for input(s): VITAMINB12, FOLATE, FERRITIN, TIBC, IRON, RETICCTPCT in the last 72 hours. Sepsis Labs: Recent Labs  Lab 06/10/20 1929 06/10/20 2205  PROCALCITON 1.24  --   LATICACIDVEN  --  1.7    Recent Results (from the past 240 hour(s))  Blood culture (routine x 2)     Status: Abnormal   Collection Time: 06/10/20 10:00 PM   Specimen: BLOOD  Result Value Ref Range Status   Specimen Description   Final    BLOOD RIGHT HAND Performed at [facility], [address],  [place], [place] [address]    Special Requests   Final    BOTTLES DRAWN AEROBIC AND ANAEROBIC Blood Culture adequate volume Performed at [facility], [address], [place], [place] [address]    Culture  Setup Time   Final    GRAM POSITIVE COCCI IN BOTH AEROBIC AND ANAEROBIC BOTTLES Organism ID to follow CRITICAL RESULT CALLED TO, READ BACK BY AND VERIFIED WITH: KAREN HAYES 06/11/20 0912 KLW Performed at [facility], [address] [place], [place] [address]    Culture STREPTOCOCCUS PNEUMONIAE (A)  Final   Report Status 06/13/2020 FINAL  Final   Organism ID, Bacteria STREPTOCOCCUS PNEUMONIAE  Final      Susceptibility   Streptococcus pneumoniae -  MIC*    ERYTHROMYCIN <=0.12 SENSITIVE Sensitive     LEVOFLOXACIN 0.5 SENSITIVE Sensitive     VANCOMYCIN 0.5 SENSITIVE Sensitive     PENO - penicillin <=0.06      PENICILLIN (non-meningitis) <=0.06 SENSITIVE Sensitive     PENICILLIN (oral) <=0.06 SENSITIVE Sensitive     CEFTRIAXONE (non-meningitis) <=0.12 SENSITIVE Sensitive     * STREPTOCOCCUS PNEUMONIAE  Blood Culture ID Panel (Reflexed)     Status: Abnormal   Collection Time: 06/10/20 10:00 PM  Result Value Ref Range Status   Enterococcus faecalis NOT DETECTED NOT DETECTED Final   Enterococcus Faecium NOT DETECTED NOT DETECTED Final   Listeria monocytogenes NOT DETECTED NOT DETECTED Final   Staphylococcus species NOT DETECTED NOT DETECTED Final   Staphylococcus aureus (BCID) NOT DETECTED NOT DETECTED Final   Staphylococcus epidermidis NOT DETECTED NOT DETECTED Final   Staphylococcus lugdunensis NOT DETECTED NOT DETECTED Final   Streptococcus species DETECTED (A) NOT DETECTED Final    Comment: CRITICAL RESULT CALLED TO, READ BACK BY AND VERIFIED WITH: KAREN HAYES 06/11/20 0912 KLW    Streptococcus agalactiae NOT DETECTED NOT DETECTED Final   Streptococcus pneumoniae DETECTED (A) NOT DETECTED Final    Comment: CRITICAL RESULT CALLED TO, READ BACK BY AND VERIFIED  WITH: KAREN HAYES 06/11/20 0912 KLW    Streptococcus pyogenes NOT DETECTED NOT DETECTED Final   A.calcoaceticus-baumannii NOT DETECTED NOT DETECTED Final   Bacteroides fragilis NOT DETECTED NOT DETECTED Final   Enterobacterales NOT DETECTED NOT DETECTED Final   Enterobacter cloacae complex NOT DETECTED NOT DETECTED Final   Escherichia coli NOT DETECTED NOT DETECTED Final   Klebsiella aerogenes NOT DETECTED NOT DETECTED Final   Klebsiella oxytoca NOT DETECTED NOT DETECTED Final   Klebsiella pneumoniae NOT DETECTED NOT DETECTED Final   Proteus species NOT DETECTED NOT DETECTED Final   Salmonella species NOT DETECTED NOT DETECTED Final   Serratia marcescens NOT DETECTED NOT DETECTED Final   Haemophilus influenzae NOT DETECTED NOT DETECTED Final   Neisseria meningitidis NOT DETECTED NOT DETECTED Final   Pseudomonas aeruginosa NOT DETECTED NOT DETECTED Final   Stenotrophomonas maltophilia NOT DETECTED NOT DETECTED Final   Candida albicans NOT DETECTED NOT DETECTED Final   Candida auris NOT DETECTED NOT DETECTED Final   Candida glabrata NOT DETECTED NOT DETECTED Final   Candida krusei NOT DETECTED NOT DETECTED Final   Candida parapsilosis NOT DETECTED NOT DETECTED Final   Candida tropicalis NOT DETECTED NOT DETECTED Final   Cryptococcus neoformans/gattii NOT DETECTED NOT DETECTED Final    Comment: Performed at [facility], [address] [place], [place] [address]  Respiratory Panel by RT PCR (Flu A&B, Covid) - Nasopharyngeal Swab     Status: None   Collection Time: 06/10/20 10:05 PM   Specimen: Nasopharyngeal Swab  Result Value Ref Range Status   SARS Coronavirus 2 by RT PCR NEGATIVE NEGATIVE Final    Comment: (NOTE) SARS-CoV-2 target nucleic acids are NOT DETECTED.  The SARS-CoV-2 RNA is generally detectable in upper respiratoy specimens during the acute phase of infection. The lowest concentration of SARS-CoV-2 viral copies this assay can detect is 131 copies/mL. A  negative result does not preclude SARS-Cov-2 infection and should not be used as the sole basis for treatment or other patient management decisions. A negative result may occur with  improper specimen collection/handling, submission of specimen other than nasopharyngeal swab, presence of viral mutation(s) within the areas targeted by this assay, and inadequate number of viral copies (<131 copies/mL). A negative result  must be combined with clinical observations, patient history, and epidemiological information. The expected result is Negative.  Fact Sheet for Patients:  https://www.moore.com/  Fact Sheet for Healthcare Providers:  https://www.young.biz/  This test is no t yet approved or cleared by the [place] FDA and  has been authorized for detection and/or diagnosis of SARS-CoV-2 by FDA under an Emergency Use Authorization (EUA). This EUA will remain  in effect (meaning this test can be used) for the duration of the COVID-19 declaration under Section 564(b)(1) of the Act, 21 U.S.C. section 360bbb-3(b)(1), unless the authorization is terminated or revoked sooner.     Influenza A by PCR NEGATIVE NEGATIVE Final   Influenza B by PCR NEGATIVE NEGATIVE Final    Comment: (NOTE) The Xpert Xpress SARS-CoV-2/FLU/RSV assay is intended as an aid in  the diagnosis of influenza from Nasopharyngeal swab specimens and  should not be used as a sole basis for treatment. Nasal washings and  aspirates are unacceptable for Xpert Xpress SARS-CoV-2/FLU/RSV  testing.  Fact Sheet for Patients: https://www.moore.com/  Fact Sheet for Healthcare Providers: https://www.young.biz/  This test is not yet approved or cleared by the [place] FDA and  has been authorized for detection and/or diagnosis of SARS-CoV-2 by  FDA under an Emergency Use Authorization (EUA). This EUA will remain  in effect (meaning this test can  be used) for the duration of the  Covid-19 declaration under Section 564(b)(1) of the Act, 21  U.S.C. section 360bbb-3(b)(1), unless the authorization is  terminated or revoked. Performed at [facility], [address] [place], [place] [address]   Blood culture (routine x 2)     Status: Abnormal   Collection Time: 06/10/20 10:05 PM   Specimen: BLOOD  Result Value Ref Range Status   Specimen Description   Final    BLOOD LEFT [facility] Performed at [facility], [address] [place], [place] [address]    Special Requests   Final    BOTTLES DRAWN AEROBIC AND ANAEROBIC Blood Culture adequate volume Performed at [facility], [address] [place] [address] [place], [place] [address]    Culture  Setup Time   Final    GRAM POSITIVE COCCI IN BOTH AEROBIC AND ANAEROBIC BOTTLES CRITICAL RESULT CALLED TO, READ BACK BY AND VERIFIED WITH: KAREN HAYES 06/11/20 0912 KLW Performed at [facility], [address] [place], [place] [address]    Culture (A)  Final    STREPTOCOCCUS PNEUMONIAE SUSCEPTIBILITIES PERFORMED ON PREVIOUS CULTURE WITHIN THE LAST 5 DAYS. Performed at [facility], [address] [place], [place] [address]    Report Status 06/13/2020 FINAL  Final  Body fluid culture     Status: None   Collection Time: 06/10/20 10:05 PM   Specimen: KNEE; Body Fluid  Result Value Ref Range Status   Specimen Description   Final    KNEE RIGHT Performed at [facility], [address] [place], [place] [address]    Special Requests   Final    NONE Performed at [facility], [address] [place], [place] [address]    Gram Stain   Final    ABUNDANT WBC PRESENT, PREDOMINANTLY PMN FEW GRAM POSITIVE COCCI RESULT CALLED TO, READ BACK BY AND VERIFIED WITHSolon Augusta RN 06/11/20 0556 JDW Performed at [facility], [address], [place], [place] [address]    Culture FEW STREPTOCOCCUS PNEUMONIAE  Final   Report Status 06/13/2020 FINAL   Final   Organism ID,  Bacteria STREPTOCOCCUS PNEUMONIAE  Final      Susceptibility   Streptococcus pneumoniae - MIC*    ERYTHROMYCIN <=0.12 SENSITIVE Sensitive     LEVOFLOXACIN 0.5 SENSITIVE Sensitive     VANCOMYCIN 0.5 SENSITIVE Sensitive     PENO - penicillin <=0.06      PENICILLIN (non-meningitis) <=0.06 SENSITIVE Sensitive     PENICILLIN (oral) <=0.06 SENSITIVE Sensitive     CEFTRIAXONE (non-meningitis) <=0.12 SENSITIVE Sensitive     * FEW STREPTOCOCCUS PNEUMONIAE  Aerobic/Anaerobic Culture (surgical/deep wound)     Status: None (Preliminary result)   Collection Time: 06/11/20  1:40 PM   Specimen: PATH Soft tissue  Result Value Ref Range Status   Specimen Description   Final    WOUND Performed at [facility], [address], [place], [place] [address]    Special Requests   Final    LEFT KNEE WOUND CULTURE Performed at [facility], [address] [place], [place] [address]    Gram Stain   Final    FEW WBC PRESENT,BOTH PMN AND MONONUCLEAR RARE GRAM POSITIVE COCCI Performed at [facility], [address] [place], [place] [address]    Culture   Final    RARE STREPTOCOCCUS PNEUMONIAE SUSCEPTIBILITIES TO FOLLOW NO ANAEROBES ISOLATED; CULTURE IN PROGRESS FOR 5 DAYS    Report Status PENDING  Incomplete  Aerobic/Anaerobic Culture (surgical/deep wound)     Status: None (Preliminary result)   Collection Time: 06/11/20  1:41 PM   Specimen: PATH Soft tissue  Result Value Ref Range Status   Specimen Description   Final    TISSUE Performed at [facility], [address], [place], [place] [address]    Special Requests   Final    LEFT KNEE TISSUE Performed at [facility], [address] [place], [place] [address]    Gram Stain NO WBC SEEN NO ORGANISMS SEEN   Final   Culture   Final    NO GROWTH 3 DAYS NO ANAEROBES ISOLATED; CULTURE IN PROGRESS FOR 5 DAYS Performed at [facility], [address] [place], [place]  [address]    Report Status PENDING  Incomplete  CULTURE, BLOOD (ROUTINE X 2) w Reflex to ID Panel     Status: None (Preliminary result)   Collection Time: 06/13/20 12:26 AM   Specimen: BLOOD  Result Value Ref Range Status   Specimen Description BLOOD BLOOD RIGHT HAND  Final   Special Requests   Final    BOTTLES DRAWN AEROBIC AND ANAEROBIC Blood Culture results may not be optimal due to an excessive volume of blood received in culture bottles   Culture   Final    NO GROWTH 1 DAY Performed at [facility], [address], [place], [place] [address]    Report Status PENDING  Incomplete  CULTURE, BLOOD (ROUTINE X 2) w Reflex to ID Panel     Status: None (Preliminary result)   Collection Time: 06/13/20 12:33 AM   Specimen: BLOOD  Result Value Ref Range Status   Specimen Description BLOOD LEFT ANTECUBITAL  Final   Special Requests   Final    BOTTLES DRAWN AEROBIC AND ANAEROBIC Blood Culture results may not be optimal due to an excessive volume of blood received in culture bottles   Culture   Final    NO GROWTH 1 DAY Performed at [facility], [address] [place], [place] [address]    Report Status PENDING  Incomplete  Radiology Studies: No results found.   Scheduled Meds: . atorvastatin  20 mg Oral q1800  . buprenorphine  2 patch Transdermal Weekly  . busPIRone  5 mg Oral TID  . docusate sodium  100 mg Oral BID  . enoxaparin (LOVENOX) injection  40 mg Subcutaneous Q24H  . feeding supplement  237 mL Oral BID BM  . guaiFENesin  600 mg Oral BID  . ipratropium-albuterol  3 mL Nebulization TID  . multivitamin with minerals  1 tablet Oral Daily  . predniSONE  40 mg Oral Q breakfast  . sertraline  50 mg Oral Daily  . topiramate  25 mg Oral BID   Continuous Infusions: . cefTRIAXone (ROCEPHIN)  IV 2 g (06/14/20 1207)     LOS: 4 days     Darlin Priestly, MD [facility] If 7PM-7AM, please contact night-coverage 06/14/2020, 5:29 PM

## 2020-06-14 NOTE — Progress Notes (Addendum)
  Subjective: 3 Days Post-Op Procedure(s) (LRB): IRRIGATION AND DEBRIDEMENT KNEE WITH POLY EXCHANGE (N/A) Patient reports pain as mild.   Patient is well and denies any increase in SOB.  Being treated for pneumonia.  Transferred to the bedside commode last night. PT and Care management to assist with discharge planning. Fever: No recent fever Gastrointestinal:Negative for nausea and vomiting  Objective: Vital signs in last 24 hours: Temp:  [97.9 F (36.6 C)-98.2 F (36.8 C)] 98.2 F (36.8 C) (10/15 2355) Pulse Rate:  [71-89] 89 (10/15 2355) Resp:  [16-17] 16 (10/15 2355) BP: (127-179)/(80-88) 144/80 (10/15 2355) SpO2:  [94 %-99 %] 99 % (10/15 2355)  Intake/Output from previous day: No intake or output data in the 24 hours ending 06/14/20 0703  Intake/Output this shift: No intake/output data recorded.  Labs: Recent Labs    06/12/20 0335 06/13/20 0517 06/14/20 0408  HGB 11.7* 11.9* 13.2   Recent Labs    06/13/20 0517 06/14/20 0408  WBC 13.7* 11.4*  RBC 4.14 4.66  HCT 38.3 43.3  PLT 235 244   Recent Labs    06/13/20 0517 06/14/20 0408  NA 137 142  K 3.6 3.6  CL 101 100  CO2 29 32  BUN 21 17  CREATININE 0.73 0.60  GLUCOSE 153* 80  CALCIUM 8.1* 8.8*   No results for input(s): LABPT, INR in the last 72 hours.   EXAM General - Patient is Alert, Appropriate and Oriented Extremity - ABD soft Sensation intact distally Intact pulses distally Dorsiflexion/Plantar flexion intact Incision: dressing C/D/I No cellulitis present Dressing/Incision - clean, dry, no drainage, hemovac removed with no complication. Motor Function - intact, moving foot and toes well on exam.  Able to ambulate 5 feet to the chair.  Past Medical History:  Diagnosis Date  . Anxiety   . Asthma   . Chronic pain    lower back  . Chronic systolic CHF (congestive heart failure) (HCC) 09/22/2019  . COPD (chronic obstructive pulmonary disease) (HCC)   . Depression   . DJD (degenerative  joint disease)   . Fibromyalgia   . GERD (gastroesophageal reflux disease)   . Headache   . Hyperlipidemia   . Lower extremity edema   . On home oxygen therapy    has not been on since 2016  . Panic attack   . Shortness of breath dyspnea   . Sleep disorder     Assessment/Plan: 3 Days Post-Op Procedure(s) (LRB): IRRIGATION AND DEBRIDEMENT KNEE WITH POLY EXCHANGE (N/A) Active Problems:   CAP (community acquired pneumonia)  Estimated body mass index is 30.11 kg/m as calculated from the following:   Height as of this encounter: 5\' 3"  (1.6 m).   Weight as of this encounter: 77.1 kg. Advance diet Up with therapy   Labs reviewed, WBC 11.4 and improving.   Up with therapy.  Continue working on BM. Cultures from surgery pending, continue Rocephin and Vancomycin.   Urinalysis for urinary frequency and amber-colored urine. Patient will likely need SNF upon discharge.  DVT Prophylaxis - Lovenox and Foot Pumps Weight-Bearing as tolerated to left leg  PA-C [facility] Orthopaedic Surgery 06/14/2020, 4:41 pm

## 2020-06-14 NOTE — Progress Notes (Signed)
Physical Therapy Treatment Patient Details Name: Bethany Villa MRN: [id number] DOB: [birth year] Today's Date: 06/14/2020    History of Present Illness Bethany Villa is a 64 y/o female who ws admitted with c/o knee pain/swelling and SOB with associated cough with clear sputum. Pt underwent L knee debridement with antibiotic bead placement on 10/13. 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    PT Comments    Pt alert, agreeable to PT session with moderate encouragement. Pt reported 10/10 pain and stated RN just administered pain medication prior to PT arrival. Session limited due to pt motivation and elevated pain levels; able to perform several supine exercises AAROM on LLE. Supine <> sit with minA for LLE management and heavy reliance on bed rail support. Once patient in sitting, good balance noted. Despite education and encouragement pt continued to decline further mobility attempts due to pain, but agreeable to attempt out of bed to chair with nursing staff later today after a nap and "a few tears". Pt in bed with all needs in reach. The patient would benefit from further skilled PT intervention to progress towards goals as able. Recommendation remains appropriate.     Follow Up Recommendations  SNF;Supervision for mobility/OOB     Equipment Recommendations  Rolling walker with 5" wheels;3in1 (PT)    Recommendations for Other Services       Precautions / Restrictions Precautions Precautions: Fall;Knee Precaution Booklet Issued: No Restrictions Weight Bearing Restrictions: Yes LLE Weight Bearing: Weight bearing as tolerated    Mobility  Bed Mobility Overal bed mobility: Needs Assistance Bed Mobility: Supine to Sit;Sit to Supine     Supine to sit: Min assist;HOB elevated Sit to supine: Min assist;HOB elevated   General bed mobility comments: minA for LLE management  Transfers                  General transfer comment: Pt declining further mobility despite education and encouragement;reported 10/10 pain. Per pt RN had just administered pain medication prior to PT arrival  Ambulation/Gait                 Stairs             Wheelchair Mobility    Modified Rankin (Stroke Patients Only)       Balance Overall balance assessment: Needs assistance Sitting-balance support: Feet supported;Bilateral upper extremity supported Sitting balance-Leahy Scale: Good Sitting balance - Comments: BUE in wide tripod position; SBA for safety                                    Cognition Arousal/Alertness: Awake/alert Behavior During Therapy: WFL for tasks assessed/performed Overall Cognitive Status: Within Functional Limits for tasks assessed                                 General Comments: Pt alert/oriented x 4.      Exercises General Exercises - Lower Extremity Ankle Circles/Pumps: AROM;Both;10 reps Quad Sets: AROM;Strengthening;Left;10 reps Short Arc Quad: AROM;Strengthening;Left;10 reps Heel Slides: AAROM;Strengthening;Left;10 reps Hip ABduction/ADduction: AAROM;Strengthening;Left;10 reps    General Comments        Pertinent Vitals/Pain Pain Assessment: 0-10 Pain Score: 10-Worst pain ever Pain Location: L knee Pain Descriptors / Indicators: Aching;Discomfort;Guarding;Grimacing Pain Intervention(s): Monitored during session;Premedicated before session;Repositioned;Ice applied    Home Living  Prior Function            PT Goals (current goals can now be found in the care plan section) Progress towards PT goals: Progressing toward goals    Frequency    7X/week      PT Plan Current plan remains appropriate    Co-evaluation              AM-PAC PT "6 Clicks" Mobility   Outcome Measure  Help needed turning from your back to your side while in a flat bed without using bedrails?: A  Little Help needed moving from lying on your back to sitting on the side of a flat bed without using bedrails?: A Little Help needed moving to and from a bed to a chair (including a wheelchair)?: A Little Help needed standing up from a chair using your arms (e.g., wheelchair or bedside chair)?: A Little Help needed to walk in hospital room?: A Little Help needed climbing 3-5 steps with a railing? : A Lot 6 Click Score: 17    End of Session Equipment Utilized During Treatment: Gait belt;Oxygen Activity Tolerance: Patient limited by fatigue;Patient limited by pain Patient left: in bed;with bed alarm set;with call bell/phone within reach Nurse Communication: Mobility status PT Visit Diagnosis: Unsteadiness on feet (R26.81);Muscle weakness (generalized) (M62.81);Pain Pain - Right/Left: Left Pain - part of body: Knee     Time: 7209-4709 PT Time Calculation (min) (ACUTE ONLY): 26 min  Charges:  $Therapeutic Exercise: 23-37 mins                    Olga Coaster PT, DPT 10:17 AM,06/14/20

## 2020-06-15 DIAGNOSIS — J189 Pneumonia, unspecified organism: Secondary | ICD-10-CM | POA: Diagnosis not present

## 2020-06-15 LAB — CBC
HCT: 40.3 % (ref 36.0–46.0)
Hemoglobin: 12.2 g/dL (ref 12.0–15.0)
MCH: 28.2 pg (ref 26.0–34.0)
MCHC: 30.3 g/dL (ref 30.0–36.0)
MCV: 93.1 fL (ref 80.0–100.0)
Platelets: 213 K/uL (ref 150–400)
RBC: 4.33 MIL/uL (ref 3.87–5.11)
RDW: 14.4 % (ref 11.5–15.5)
WBC: 11.2 K/uL — ABNORMAL HIGH (ref 4.0–10.5)
nRBC: 0 % (ref 0.0–0.2)

## 2020-06-15 LAB — BASIC METABOLIC PANEL
Anion gap: 9 (ref 5–15)
BUN: 21 mg/dL (ref 8–23)
CO2: 31 mmol/L (ref 22–32)
Calcium: 8.1 mg/dL — ABNORMAL LOW (ref 8.9–10.3)
Chloride: 101 mmol/L (ref 98–111)
Creatinine, Ser: 0.58 mg/dL (ref 0.44–1.00)
GFR, Estimated: >60 mL/min (ref >60)
Glucose, Bld: 96 mg/dL (ref 70–99)
Potassium: 3.6 mmol/L (ref 3.5–5.1)
Sodium: 141 mmol/L (ref 135–145)

## 2020-06-15 LAB — MAGNESIUM: Magnesium: 1.8 mg/dL (ref 1.7–2.4)

## 2020-06-15 NOTE — Progress Notes (Signed)
  Subjective: 4 Days Post-Op Procedure(s) (LRB): IRRIGATION AND DEBRIDEMENT KNEE WITH POLY EXCHANGE (N/A) Patient reports pain as mild.   Patient is well and denies any increase in SOB.  Being treated for pneumonia.   PT and Care management to assist with discharge planning. Fever: No recent fever Gastrointestinal:Negative for nausea and vomiting  Objective: Vital signs in last 24 hours: Temp:  [97.6 F (36.4 C)-98.1 F (36.7 C)] 98 F (36.7 C) (10/16 2352) Pulse Rate:  [93-110] 93 (10/16 2352) Resp:  [16-20] 16 (10/16 2352) BP: (116-131)/(73-84) 131/79 (10/16 2352) SpO2:  [96 %-100 %] 98 % (10/16 2352)  Intake/Output from previous day:  Intake/Output Summary (Last 24 hours) at 06/15/2020 0703 Last data filed at 06/15/2020 5993 Gross per 24 hour  Intake --  Output 500 ml  Net -500 ml    Intake/Output this shift: No intake/output data recorded.  Labs: Recent Labs    06/13/20 0517 06/14/20 0408 06/15/20 0458  HGB 11.9* 13.2 12.2   Recent Labs    06/14/20 0408 06/15/20 0458  WBC 11.4* 11.2*  RBC 4.66 4.33  HCT 43.3 40.3  PLT 244 213   Recent Labs    06/14/20 0408 06/15/20 0458  NA 142 141  K 3.6 3.6  CL 100 101  CO2 32 31  BUN 17 21  CREATININE 0.60 0.58  GLUCOSE 80 96  CALCIUM 8.8* 8.1*   No results for input(s): LABPT, INR in the last 72 hours.   EXAM General - Patient is Alert, Appropriate and Oriented Extremity - ABD soft Sensation intact distally Intact pulses distally Dorsiflexion/Plantar flexion intact Incision: dressing C/D/I No cellulitis present Dressing/Incision - clean, dry, no drainage Motor Function - intact, moving foot and toes well on exam.  Able to ambulate 5 feet to the chair.  Past Medical History:  Diagnosis Date  . Anxiety   . Asthma   . Chronic pain    lower back  . Chronic systolic CHF (congestive heart failure) (HCC) 09/22/2019  . COPD (chronic obstructive pulmonary disease) (HCC)   . Depression   . DJD  (degenerative joint disease)   . Fibromyalgia   . GERD (gastroesophageal reflux disease)   . Headache   . Hyperlipidemia   . Lower extremity edema   . On home oxygen therapy    has not been on since 2016  . Panic attack   . Shortness of breath dyspnea   . Sleep disorder     Assessment/Plan: 4 Days Post-Op Procedure(s) (LRB): IRRIGATION AND DEBRIDEMENT KNEE WITH POLY EXCHANGE (N/A) Active Problems:   CAP (community acquired pneumonia)  Estimated body mass index is 30.11 kg/m as calculated from the following:   Height as of this encounter: 5\' 3"  (1.6 m).   Weight as of this encounter: 77.1 kg. Advance diet Up with therapy   Labs reviewed, WBC 11.2 and improving.   Up with therapy.  Continue working on BM. Cultures from surgery pending, continue Rocephin and Vancomycin.   Patient will likely need SNF upon discharge.  DVT Prophylaxis - Lovenox and Foot Pumps Weight-Bearing as tolerated to left leg  PA-C [facility] 06/15/2020, 4:41 pm

## 2020-06-15 NOTE — Progress Notes (Signed)
Physical Therapy Treatment Patient Details Name: Bethany Villa MRN: [id number] DOB: Jan 07, 1956 Today's Date: 06/15/2020    History of Present Illness Bethany Villa is a 64 y/o female who ws admitted with c/o knee pain/swelling and SOB with associated cough with clear sputum. Pt underwent L knee debridement with antibiotic bead placement on 10/13. 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    PT Comments    Pt ready for session but reports continued pain and not feeling well.   Participated in exercises as described below.  To EOB with light min a 1 for LE assist and encouragement.  Steady in sitting.  She is able to stand with min a x 1 and with much encouragement transfer to recliner at bedside.  She takes no true steps but mostly shuffles her feet with walker support.  Remained in recliner after session.   Follow Up Recommendations  SNF     Equipment Recommendations  Rolling walker with 5" wheels;3in1 (PT)    Recommendations for Other Services       Precautions / Restrictions Precautions Precautions: Fall;Knee Precaution Booklet Issued: No Restrictions Weight Bearing Restrictions: Yes LLE Weight Bearing: Weight bearing as tolerated    Mobility  Bed Mobility Overal bed mobility: Needs Assistance Bed Mobility: Supine to Sit     Supine to sit: Min assist;HOB elevated     General bed mobility comments: minA for LLE management  Transfers Overall transfer level: Needs assistance Equipment used: Rolling walker (2 wheeled) Transfers: Sit to/from Stand Sit to Stand: Min assist            Ambulation/Gait Ambulation/Gait assistance: Editor, commissioning (Feet): 3 Feet Assistive device: Rolling walker (2 wheeled) Gait Pattern/deviations: Step-to pattern;Trunk flexed Gait velocity: decreased   General Gait Details: minimal step to pattern, mostly shuffle steps to recliner with mod  encouragement   Stairs             Wheelchair Mobility    Modified Rankin (Stroke Patients Only)       Balance Overall balance assessment: Needs assistance Sitting-balance support: Feet supported;Bilateral upper extremity supported Sitting balance-Leahy Scale: Good     Standing balance support: Bilateral upper extremity supported;During functional activity Standing balance-Leahy Scale: Fair Standing balance comment: CGA for balance with BUE on RW for support                            Cognition Arousal/Alertness: Awake/alert Behavior During Therapy: WFL for tasks assessed/performed Overall Cognitive Status: Within Functional Limits for tasks assessed                                 General Comments: Pt alert/oriented x 4.      Exercises General Exercises - Lower Extremity Ankle Circles/Pumps: AROM;Both;10 reps Quad Sets: AROM;Strengthening;Left;10 reps Short Arc Quad: AROM;Strengthening;Left;10 reps Long Arc Quad: AROM;Strengthening;Left;10 reps Heel Slides: AAROM;Strengthening;Left;10 reps Hip ABduction/ADduction: AAROM;Strengthening;Left;10 reps    General Comments        Pertinent Vitals/Pain Pain Assessment: Faces Faces Pain Scale: Hurts whole lot Pain Location: L knee Pain Descriptors / Indicators: Aching;Discomfort;Guarding;Grimacing Pain Intervention(s): Limited activity within patient's tolerance;Monitored during session;Repositioned;Ice applied    Home Living                      Prior Function  PT Goals (current goals can now be found in the care plan section) Progress towards PT goals: Progressing toward goals    Frequency    7X/week      PT Plan Current plan remains appropriate    Co-evaluation              AM-PAC PT "6 Clicks" Mobility   Outcome Measure  Help needed turning from your back to your side while in a flat bed without using bedrails?: A Little Help needed moving  from lying on your back to sitting on the side of a flat bed without using bedrails?: A Little Help needed moving to and from a bed to a chair (including a wheelchair)?: A Little Help needed standing up from a chair using your arms (e.g., wheelchair or bedside chair)?: A Little Help needed to walk in hospital room?: A Little Help needed climbing 3-5 steps with a railing? : A Lot 6 Click Score: 17    End of Session Equipment Utilized During Treatment: Gait belt;Oxygen Activity Tolerance: Patient limited by fatigue;Patient limited by pain Patient left: in chair;with call bell/phone within reach;with chair alarm set Nurse Communication: Mobility status Pain - Right/Left: Left Pain - part of body: Knee     Time: 3664-4034 PT Time Calculation (min) (ACUTE ONLY): 23 min  Charges:  $Therapeutic Exercise: 8-22 mins $Therapeutic Activity: 8-22 mins                    Danielle Dess, PTA 06/15/20, 10:21 AM

## 2020-06-15 NOTE — Progress Notes (Signed)
PROGRESS NOTE    Bethany Villa  RUE:[id number] DOB: [birth year] DOA: 06/10/2020 PCP: [name], MD    Assessment & Plan:   Active Problems:   CAP (community acquired pneumonia)   Bethany Villa  is a 64 y.o. Caucasian female with a known history of asthma, dyslipidemia, fibromyalgia, GERD, COPD, depression and anxiety, who presented to the emergency room with acute onset of dyspnea which has been worsening over the last week with associated cough productive of clear sputum as well as wheezing with nausea without vomiting or abdominal pain.  She is having mild dizziness without headache or blurred vision or paresthesias or focal muscle weakness..  She is also been having mildly worsening left knee pain with swelling since earlier during the day.  She denied any insect bites or trauma.  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  She admits to fever and chills.  No chest pain or palpitations.   # Sepsis 2/2 infections mentioned below --pt presented with tachycardia, leukocytosis, elevated procal, and multiple sources of infection as below. --ID consulted --treat with abx as below  # Strep pneumoniae bacteremia due to right upper lobe PNA -CXR showed New large right upper lobe pneumonia.  Blood cx 2 of 2 positive for Strep pneumoniae. --started on ceftriaxone and vanc, vanc since d/c'ed. --2d echo no mention of vegetation. PLAN: --cont ceftriaxone --will need to order PICC line --pt will need 6 weeks of abx anyways due to prosthetic joint infection, so no need to pursue further workup for endocarditis.  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  Did have PNA.  Started tx for COPD exacerbation on admission. PLAN: --continue steroid taper as prednisone 40 mg daily for 4 doses --continue DuoNeb QID --wean O2  4.  Depression and anxiety. -on home Zoloft, Topamax, trazodone, BuSpar and Xanax --cont curreng regimen  5.  Dyslipidemia. -cont home statin  Chronic pain on chronic opioids --pt on Butrans patch at home, Rx confirmed --continue Butrans patch   DVT prophylaxis: Lovenox SQ Code Status: Full code  Family Communication:  Status is: inpatient Dispo:   The patient is from: home Anticipated d/c is to: SNF Anticipated d/c date is: whenever bed available. Patient currently is medically stable to d/c.   Subjective and Interval History:  Pt continued to complain of pain left knee.     Objective: Vitals:   06/14/20 2007 06/14/20 2026 06/14/20 2352 06/15/20 0744  BP:  130/84 131/79 130/84  Pulse:  (!) 101 93 98  Resp:  Temp:  97.6 F (36.4 C) 98 F (36.7 C) 97.9 F (36.6 C)  TempSrc:  Oral    SpO2: 96% 99% 98% 98%  Weight:      Height:        Intake/Output Summary (Last 24 hours) at 06/15/2020 1710 Last data filed at 06/15/2020 1478 Gross per 24 hour  Intake --  Output 500 ml  Net -500 ml   Filed Weights   06/10/20 1808 06/11/20 1138  Weight: 77.1 kg 77.1 kg  Examination:  Constitutional: NAD, AAOx3 HEENT: conjunctivae and lids normal, EOMI CV: No cyanosis.   RESP: No wheezes, normal work of breathing Extremities: left knee in cooling brace SKIN: warm, dry and intact Neuro: II - XII grossly intact.   Psych: Normal mood and affect.     Data Reviewed: I have personally reviewed following labs and imaging studies  CBC: Recent Labs  Lab 06/11/20 0623 06/12/20 0335 06/13/20 0517 06/14/20 0408 06/15/20 0458  WBC 13.4* 17.0* 13.7* 11.4* 11.2*  NEUTROABS 11.5*  --   --   --   --    HGB 13.5 11.7* 11.9* 13.2 12.2  HCT 42.4 37.4 38.3 43.3 40.3  MCV 89.8 90.8 92.5 92.9 93.1  PLT 195 207 235 244 213   Basic Metabolic Panel: Recent Labs  Lab 06/11/20 0623 06/12/20 0335 06/13/20 0517 06/14/20 0408 06/15/20 0458  NA 136 138 137 142 141  K 3.6 3.7 3.6 3.6 3.6  CL 98 103 101 100 101  CO2 27 27 29  32 31  GLUCOSE 144* 128* 153* 80 96  BUN 22 35* 21 17 21   CREATININE 0.70 0.82 0.73 0.60 0.58  CALCIUM 8.5* 7.9* 8.1* 8.8* 8.1*  MG  --  2.4 2.1 2.1 1.8   GFR: Estimated Creatinine Clearance: 70.8 mL/min (by C-G formula based on SCr of 0.58 mg/dL). Liver Function Tests: Recent Labs  Lab 06/11/20 0623  AST 32  ALT 23  ALKPHOS 117  BILITOT 1.3*  PROT 6.6  ALBUMIN 2.4*   No results for input(s): LIPASE, AMYLASE in the last 168 hours. No results for input(s): AMMONIA in the last 168 hours. Coagulation Profile: Recent Labs  Lab 06/11/20 0623  INR 1.4*   Cardiac Enzymes: No results for input(s): CKTOTAL, CKMB, CKMBINDEX, TROPONINI in the last 168 hours. BNP (last 3 results) No results for input(s): PROBNP in the last 8760 hours. HbA1C: No results for input(s): HGBA1C in the last 72 hours. CBG: No results for input(s): GLUCAP in the last 168 hours. Lipid Profile: No results for input(s): CHOL, HDL, LDLCALC, TRIG, CHOLHDL, LDLDIRECT in the last 72 hours. Thyroid Function Tests: No results for input(s): TSH, T4TOTAL, FREET4, T3FREE, THYROIDAB in the last 72 hours. Anemia Panel: No results for input(s): VITAMINB12, FOLATE, FERRITIN, TIBC, IRON, RETICCTPCT in the last 72 hours. Sepsis Labs: Recent Labs  Lab 06/10/20 1929 06/10/20 2205  PROCALCITON 1.24  --   LATICACIDVEN  --  1.7    Recent Results (from the past 240 hour(s))  Blood culture (routine x 2)     Status: Abnormal   Collection Time: 06/10/20 10:00 PM   Specimen: BLOOD  Result Value Ref Range Status   Specimen Description   Final    BLOOD RIGHT HAND Performed at [facility],  [address] [place], [address] [place]    Special Requests   Final    BOTTLES DRAWN AEROBIC AND ANAEROBIC Blood Culture adequate volume Performed at [facility], [address] [place], [address] [place]    Culture  Setup Time   Final    GRAM POSITIVE COCCI IN BOTH AEROBIC AND ANAEROBIC BOTTLES Organism ID to follow CRITICAL RESULT CALLED TO, READ BACK BY AND VERIFIED WITH: KAREN HAYES 06/11/20 0912 KLW Performed at [facility], [address] [place], [address] [place]    Culture STREPTOCOCCUS PNEUMONIAE (A)  Final   Report Status 06/13/2020 FINAL  Final   Organism ID, Bacteria STREPTOCOCCUS PNEUMONIAE  Final      Susceptibility  Streptococcus pneumoniae - MIC*    ERYTHROMYCIN <=0.12 SENSITIVE Sensitive     LEVOFLOXACIN 0.5 SENSITIVE Sensitive     VANCOMYCIN 0.5 SENSITIVE Sensitive     PENO - penicillin <=0.06      PENICILLIN (non-meningitis) <=0.06 SENSITIVE Sensitive     PENICILLIN (oral) <=0.06 SENSITIVE Sensitive     CEFTRIAXONE (non-meningitis) <=0.12 SENSITIVE Sensitive     * STREPTOCOCCUS PNEUMONIAE  Blood Culture ID Panel (Reflexed)     Status: Abnormal   Collection Time: 06/10/20 10:00 PM  Result Value Ref Range Status   Enterococcus faecalis NOT DETECTED NOT DETECTED Final   Enterococcus Faecium NOT DETECTED NOT DETECTED Final   Listeria monocytogenes NOT DETECTED NOT DETECTED Final   Staphylococcus species NOT DETECTED NOT DETECTED Final   Staphylococcus aureus (BCID) NOT DETECTED NOT DETECTED Final   Staphylococcus epidermidis NOT DETECTED NOT DETECTED Final   Staphylococcus lugdunensis NOT DETECTED NOT DETECTED Final   Streptococcus species DETECTED (A) NOT DETECTED Final    Comment: CRITICAL RESULT CALLED TO, READ BACK BY AND VERIFIED WITH: KAREN HAYES 06/11/20 0912 KLW    Streptococcus agalactiae NOT DETECTED NOT DETECTED Final   Streptococcus pneumoniae DETECTED (A) NOT DETECTED Final    Comment: CRITICAL RESULT CALLED TO, READ  BACK BY AND VERIFIED WITH: KAREN HAYES 06/11/20 0912 KLW    Streptococcus pyogenes NOT DETECTED NOT DETECTED Final   A.calcoaceticus-baumannii NOT DETECTED NOT DETECTED Final   Bacteroides fragilis NOT DETECTED NOT DETECTED Final   Enterobacterales NOT DETECTED NOT DETECTED Final   Enterobacter cloacae complex NOT DETECTED NOT DETECTED Final   Escherichia coli NOT DETECTED NOT DETECTED Final   Klebsiella aerogenes NOT DETECTED NOT DETECTED Final   Klebsiella oxytoca NOT DETECTED NOT DETECTED Final   Klebsiella pneumoniae NOT DETECTED NOT DETECTED Final   Proteus species NOT DETECTED NOT DETECTED Final   Salmonella species NOT DETECTED NOT DETECTED Final   Serratia marcescens NOT DETECTED NOT DETECTED Final   Haemophilus influenzae NOT DETECTED NOT DETECTED Final   Neisseria meningitidis NOT DETECTED NOT DETECTED Final   Pseudomonas aeruginosa NOT DETECTED NOT DETECTED Final   Stenotrophomonas maltophilia NOT DETECTED NOT DETECTED Final   Candida albicans NOT DETECTED NOT DETECTED Final   Candida auris NOT DETECTED NOT DETECTED Final   Candida glabrata NOT DETECTED NOT DETECTED Final   Candida krusei NOT DETECTED NOT DETECTED Final   Candida parapsilosis NOT DETECTED NOT DETECTED Final   Candida tropicalis NOT DETECTED NOT DETECTED Final   Cryptococcus neoformans/gattii NOT DETECTED NOT DETECTED Final    Comment: Performed at [facility], [address] [place], [place] [address]  Respiratory Panel by RT PCR (Flu A&B, Covid) - Nasopharyngeal Swab     Status: None   Collection Time: 06/10/20 10:05 PM   Specimen: Nasopharyngeal Swab  Result Value Ref Range Status   SARS Coronavirus 2 by RT PCR NEGATIVE NEGATIVE Final    Comment: (NOTE) SARS-CoV-2 target nucleic acids are NOT DETECTED.  The SARS-CoV-2 RNA is generally detectable in upper respiratoy specimens during the acute phase of infection. The lowest concentration of SARS-CoV-2 viral copies this assay can detect  is 131 copies/mL. A negative result does not preclude SARS-Cov-2 infection and should not be used as the sole basis for treatment or other patient management decisions. A negative result may occur with  improper specimen collection/handling, submission of specimen other than nasopharyngeal swab, presence of viral mutation(s) within the areas targeted by this assay, and inadequate number of viral copies (<131 copies/mL).  A negative result must be combined with clinical observations, patient history, and epidemiological information. The expected result is Negative.  Fact Sheet for Patients:  https://www.moore.com/  Fact Sheet for Healthcare Providers:  https://www.young.biz/  This test is no t yet approved or cleared by the [place] FDA and  has been authorized for detection and/or diagnosis of SARS-CoV-2 by FDA under an Emergency Use Authorization (EUA). This EUA will remain  in effect (meaning this test can be used) for the duration of the COVID-19 declaration under Section 564(b)(1) of the Act, 21 U.S.C. section 360bbb-3(b)(1), unless the authorization is terminated or revoked sooner.     Influenza A by PCR NEGATIVE NEGATIVE Final   Influenza B by PCR NEGATIVE NEGATIVE Final    Comment: (NOTE) The Xpert Xpress SARS-CoV-2/FLU/RSV assay is intended as an aid in  the diagnosis of influenza from Nasopharyngeal swab specimens and  should not be used as a sole basis for treatment. Nasal washings and  aspirates are unacceptable for Xpert Xpress SARS-CoV-2/FLU/RSV  testing.  Fact Sheet for Patients: https://www.moore.com/  Fact Sheet for Healthcare Providers: https://www.young.biz/  This test is not yet approved or cleared by the [place] FDA and  has been authorized for detection and/or diagnosis of SARS-CoV-2 by  FDA under an Emergency Use Authorization (EUA). This EUA will remain  in effect  (meaning this test can be used) for the duration of the  Covid-19 declaration under Section 564(b)(1) of the Act, 21  U.S.C. section 360bbb-3(b)(1), unless the authorization is  terminated or revoked. Performed at [facility], [address] [place], [place] [address]   Blood culture (routine x 2)     Status: Abnormal   Collection Time: 06/10/20 10:05 PM   Specimen: BLOOD  Result Value Ref Range Status   Specimen Description   Final    BLOOD LEFT [facility] Performed at [facility], [address] [place], [place] [address]    Special Requests   Final    BOTTLES DRAWN AEROBIC AND ANAEROBIC Blood Culture adequate volume Performed at [facility], [address], [place], [place] [address]    Culture  Setup Time   Final    GRAM POSITIVE COCCI IN BOTH AEROBIC AND ANAEROBIC BOTTLES CRITICAL RESULT CALLED TO, READ BACK BY AND VERIFIED WITH: KAREN HAYES 06/11/20 0912 KLW Performed at [facility], [address] [place], [place] [address]    Culture (A)  Final    STREPTOCOCCUS PNEUMONIAE SUSCEPTIBILITIES PERFORMED ON PREVIOUS CULTURE WITHIN THE LAST 5 DAYS. Performed at [facility], [address] [place], [place] [address]    Report Status 06/13/2020 FINAL  Final  Body fluid culture     Status: None   Collection Time: 06/10/20 10:05 PM   Specimen: KNEE; Body Fluid  Result Value Ref Range Status   Specimen Description   Final    KNEE RIGHT Performed at [facility], [address], [place], [place] [address]    Special Requests   Final    NONE Performed at [facility], [address] [place], [place] [address]    Gram Stain   Final    ABUNDANT WBC PRESENT, PREDOMINANTLY PMN FEW GRAM POSITIVE COCCI RESULT CALLED TO, READ BACK BY AND VERIFIED WITHSolon Augusta RN 06/11/20 0556 JDW Performed at [facility], [address] [place], [place] [address]    Culture FEW STREPTOCOCCUS PNEUMONIAE  Final   Report  Status 06/13/2020 FINAL  Final  Organism ID, Bacteria STREPTOCOCCUS PNEUMONIAE  Final      Susceptibility   Streptococcus pneumoniae - MIC*    ERYTHROMYCIN <=0.12 SENSITIVE Sensitive     LEVOFLOXACIN 0.5 SENSITIVE Sensitive     VANCOMYCIN 0.5 SENSITIVE Sensitive     PENO - penicillin <=0.06      PENICILLIN (non-meningitis) <=0.06 SENSITIVE Sensitive     PENICILLIN (oral) <=0.06 SENSITIVE Sensitive     CEFTRIAXONE (non-meningitis) <=0.12 SENSITIVE Sensitive     * FEW STREPTOCOCCUS PNEUMONIAE  Aerobic/Anaerobic Culture (surgical/deep wound)     Status: None (Preliminary result)   Collection Time: 06/11/20  1:40 PM   Specimen: PATH Soft tissue  Result Value Ref Range Status   Specimen Description   Final    WOUND Performed at [facility], [address], [place], [place] [address]    Special Requests   Final    LEFT KNEE WOUND CULTURE Performed at [facility], [address] [place], [place] [address]    Gram Stain   Final    FEW WBC PRESENT,BOTH PMN AND MONONUCLEAR RARE GRAM POSITIVE COCCI Performed at [facility], [address], [place], [place] [address]    Culture   Final    RARE STREPTOCOCCUS PNEUMONIAE NO ANAEROBES ISOLATED; CULTURE IN PROGRESS FOR 5 DAYS    Report Status PENDING  Incomplete   Organism ID, Bacteria STREPTOCOCCUS PNEUMONIAE  Final      Susceptibility   Streptococcus pneumoniae - MIC*    ERYTHROMYCIN <=0.12 SENSITIVE Sensitive     LEVOFLOXACIN 0.5 SENSITIVE Sensitive     VANCOMYCIN 0.5 SENSITIVE Sensitive     PENO - penicillin <=0.06      PENICILLIN (non-meningitis) <=0.06 SENSITIVE Sensitive     PENICILLIN (oral) <=0.06 SENSITIVE Sensitive     CEFTRIAXONE (non-meningitis) <=0.12 SENSITIVE Sensitive     * RARE STREPTOCOCCUS PNEUMONIAE  Aerobic/Anaerobic Culture (surgical/deep wound)     Status: None (Preliminary result)   Collection Time: 06/11/20  1:41 PM   Specimen: PATH Soft tissue  Result Value Ref Range Status    Specimen Description   Final    TISSUE Performed at [facility], [address] [place], [place] [address]    Special Requests   Final    LEFT KNEE TISSUE Performed at [facility], [address] [place], [place] [address]    Gram Stain   Final    NO WBC SEEN NO ORGANISMS SEEN Performed at [facility], [address], [place], [place] [address]    Culture   Final    CULTURE REINCUBATED FOR BETTER GROWTH NO ANAEROBES ISOLATED; CULTURE IN PROGRESS FOR 5 DAYS    Report Status PENDING  Incomplete  CULTURE, BLOOD (ROUTINE X 2) w Reflex to ID Panel     Status: None (Preliminary result)   Collection Time: 06/13/20 12:26 AM   Specimen: BLOOD  Result Value Ref Range Status   Specimen Description BLOOD BLOOD RIGHT HAND  Final   Special Requests   Final    BOTTLES DRAWN AEROBIC AND ANAEROBIC Blood Culture results may not be optimal due to an excessive volume of blood received in culture bottles   Culture   Final    NO GROWTH 2 DAYS Performed at [facility], [address] [place], [place] [address]    Report Status PENDING  Incomplete  CULTURE, BLOOD (ROUTINE X 2) w Reflex to ID Panel     Status: None (Preliminary result)   Collection  Time: 06/13/20 12:33 AM   Specimen: BLOOD  Result Value Ref Range Status   Specimen Description BLOOD LEFT ANTECUBITAL  Final   Special Requests   Final    BOTTLES DRAWN AEROBIC AND ANAEROBIC Blood Culture results may not be optimal due to an excessive volume of blood received in culture bottles   Culture   Final    NO GROWTH 2 DAYS Performed at [facility], [address] [place], [place] [address]    Report Status PENDING  Incomplete      Radiology Studies: No results found.   Scheduled Meds: . atorvastatin  20 mg Oral q1800  . buprenorphine  2 patch Transdermal Weekly  . busPIRone  5 mg Oral TID  . docusate sodium  100 mg Oral BID  . enoxaparin (LOVENOX) injection  40 mg Subcutaneous  Q24H  . feeding supplement  237 mL Oral BID BM  . guaiFENesin  600 mg Oral BID  . ipratropium-albuterol  3 mL Nebulization TID  . multivitamin with minerals  1 tablet Oral Daily  . polyethylene glycol  34 g Oral BID  . sertraline  50 mg Oral Daily  . topiramate  25 mg Oral BID   Continuous Infusions: . cefTRIAXone (ROCEPHIN)  IV 2 g (06/15/20 1111)     LOS: 5 days     Darlin Priestlyina Jamayah Myszka, MD [facility] If 7PM-7AM, please contact night-coverage 06/15/2020, 5:10 PM

## 2020-06-16 ENCOUNTER — Encounter: Payer: Self-pay | Admitting: Surgery

## 2020-06-16 ENCOUNTER — Inpatient Hospital Stay: Payer: Self-pay

## 2020-06-16 DIAGNOSIS — J189 Pneumonia, unspecified organism: Secondary | ICD-10-CM | POA: Diagnosis not present

## 2020-06-16 LAB — AEROBIC/ANAEROBIC CULTURE W GRAM STAIN (SURGICAL/DEEP WOUND)

## 2020-06-16 LAB — BASIC METABOLIC PANEL
Anion gap: 7 (ref 5–15)
BUN: 19 mg/dL (ref 8–23)
CO2: 32 mmol/L (ref 22–32)
Calcium: 8.2 mg/dL — ABNORMAL LOW (ref 8.9–10.3)
Chloride: 101 mmol/L (ref 98–111)
Creatinine, Ser: 0.6 mg/dL (ref 0.44–1.00)
GFR, Estimated: >60 mL/min (ref >60)
Glucose, Bld: 104 mg/dL — ABNORMAL HIGH (ref 70–99)
Potassium: 3.9 mmol/L (ref 3.5–5.1)
Sodium: 140 mmol/L (ref 135–145)

## 2020-06-16 LAB — CBC
HCT: 39.1 % (ref 36.0–46.0)
Hemoglobin: 12.2 g/dL (ref 12.0–15.0)
MCH: 28.6 pg (ref 26.0–34.0)
MCHC: 31.2 g/dL (ref 30.0–36.0)
MCV: 91.8 fL (ref 80.0–100.0)
Platelets: 226 10*3/uL (ref 150–400)
RBC: 4.26 MIL/uL (ref 3.87–5.11)
RDW: 14.5 % (ref 11.5–15.5)
WBC: 12.6 10*3/uL — ABNORMAL HIGH (ref 4.0–10.5)
nRBC: 0 % (ref 0.0–0.2)

## 2020-06-16 LAB — MAGNESIUM: Magnesium: 1.9 mg/dL (ref 1.7–2.4)

## 2020-06-16 MED ORDER — CHLORHEXIDINE GLUCONATE CLOTH 2 % EX PADS
6.0000 | MEDICATED_PAD | Freq: Every day | CUTANEOUS | Status: DC
  Administered 2020-06-16 – 2020-06-17 (×2): 6 via TOPICAL

## 2020-06-16 MED ORDER — SODIUM CHLORIDE 0.9% FLUSH
10.0000 mL | Freq: Two times a day (BID) | INTRAVENOUS | Status: DC
  Administered 2020-06-16 – 2020-06-17 (×3): 10 mL

## 2020-06-16 MED ORDER — SODIUM CHLORIDE 0.9% FLUSH: 10.0000 mL | INTRAVENOUS | Status: DC | PRN

## 2020-06-16 NOTE — TOC Progression Note (Signed)
Transition of Care [facility]) - Progression Note    Patient Details  Name: [name] MRN: [id number] Date of Birth: November 22, 1955  Transition of Care [facility]) CM/SW [place], RN Phone Number: 06/16/2020, 3:37 PM  Clinical Narrative:   RNCM met with patient at bedside, patient chooses [facility]. RNCM started insurance authorization through [employer] portal.     Expected Discharge Plan: [place] Barriers to Discharge: No Barriers Identified  Expected Discharge Plan and Services Expected Discharge Plan: [place] Choice: [place] arrangements for the past 2 months: Single Family Home                                       Social Determinants of Health (SDOH) Interventions    Readmission Risk Interventions No flowsheet data found.

## 2020-06-16 NOTE — Progress Notes (Signed)
Peripherally Inserted Central Catheter Placement  The IV Nurse has discussed with the patient and/or persons authorized to consent for the patient, the purpose of this procedure and the potential benefits and risks involved with this procedure.  The benefits include less needle sticks, lab draws from the catheter, and the patient may be discharged home with the catheter. Risks include, but not limited to, infection, bleeding, blood clot (thrombus formation), and puncture of an artery; nerve damage and irregular heartbeat and possibility to perform a PICC exchange if needed/ordered by physician.  Alternatives to this procedure were also discussed.  Bard Power PICC patient education guide, fact sheet on infection prevention and patient information card has been provided to patient /or left at bedside.    PICC Placement Documentation  PICC Single Lumen 06/16/20 PICC Right Brachial 39 cm 0 cm (Active)  Indication for Insertion or Continuance of Line Home intravenous therapies (PICC only) 06/16/20 1400  Exposed Catheter (cm) 0 cm 06/16/20 1400  Site Assessment Clean;Dry;Intact 06/16/20 1400  Line Status Flushed;Saline locked;No blood return 06/16/20 1400  Dressing Type Transparent;Securing device 06/16/20 1400  Dressing Status Clean;Dry;Intact 06/16/20 1400  Antimicrobial disc in place? Yes 06/16/20 1400  Line Care Connections checked and tightened 06/16/20 1400  Dressing Intervention New dressing 06/16/20 1400  Dressing Change Due 06/23/20 06/16/20 1400       Franne Grip Renee 06/16/2020, 2:50 PM

## 2020-06-16 NOTE — Progress Notes (Signed)
PROGRESS NOTE    Bethany Villa  ZOX:[id number] DOB: May 05, 1956 DOA: 06/10/2020 PCP: [name], MD    Assessment & Plan:   Active Problems:   CAP (community acquired pneumonia)   Bethany Villa  is a 64 y.o. Caucasian female with a known history of asthma, dyslipidemia, fibromyalgia, GERD, COPD, depression and anxiety, who presented to the emergency room with acute onset of dyspnea which has been worsening over the last week with associated cough productive of clear sputum as well as wheezing with nausea without vomiting or abdominal pain.  She is having mild dizziness without headache or blurred vision or paresthesias or focal muscle weakness..  She is also been having mildly worsening left knee pain with swelling since earlier during the day.  She denied any insect bites or trauma.  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  She admits to fever and chills.  No chest pain or palpitations.   # Sepsis 2/2 infections mentioned below --pt presented with tachycardia, leukocytosis, elevated procal, and multiple sources of infection as below. --ID consulted --treat with abx as below  # Strep pneumoniae bacteremia due to right upper lobe PNA -CXR showed New large right upper lobe pneumonia.  Blood cx 2 of 2 positive for Strep pneumoniae. --started on ceftriaxone and vanc, vanc since d/c'ed. --2d echo no mention of vegetation. PLAN: --cont ceftriaxone --PICC line today --pt will need 6 weeks of abx anyways due to prosthetic joint infection, so no need to pursue further workup for endocarditis.  # Left knee prosthetic joint infection S/p OR washout with polyethylene exchange --OR on 10/13 PLAN: --cont ceftriaxone --will need to order PICC line --Will need minimum 6 weeks of IV antibiotic and then PO antibiotic for a total of 6 months as there is retained hardware --oxycodone PRN  --f/u OR cx  # Acute on chronic COPD exacerbation # Chronic hypoxic  respiratory failure on home night-time O2 -Pt complained of dyspnea and cough.  Did have PNA.  Started tx for COPD exacerbation on admission. --finished steroid course. PLAN: --continue DuoNeb QID --wean O2  4.  Depression and anxiety. -on home Zoloft, Topamax, trazodone, BuSpar and Xanax --cont curreng regimen  5.  Dyslipidemia. -cont home statin  Chronic pain on chronic opioids --pt on Butrans patch at home, Rx confirmed --continue Butrans patch   DVT prophylaxis: Lovenox SQ Code Status: Full code  Family Communication:  Status is: inpatient Dispo:   The patient is from: home Anticipated d/c is to: SNF Anticipated d/c date is: whenever bed available. Patient currently is medically stable to d/c.   Subjective and Interval History:  Pt reported same amount of knee pain.  Had BM.  Eating ok.    PICC line placed today.    Objective: Vitals:   06/15/20 1545 06/16/20 0043 06/16/20 0814 06/16/20 1630  BP: 132/80 119/76 127/87 106/63  Pulse: 96 (!) 105 95 (!) 105  Resp: Temp: 97.8 F (36.6 C) 98 F (36.7 C) (!) 96.6 F (35.9 C) 98.3 F (36.8 C)  TempSrc: Oral Oral Axillary Oral  SpO2: 97% 95% 100% 93%  Weight:      Height:        Intake/Output Summary (Last 24 hours) at 06/16/2020 2135 Last data filed at 06/16/2020 1900 Gross per 24 hour  Intake --  Output 500 ml  Net -500 ml   Filed Weights   06/10/20 1808 06/11/20 1138  Weight: 77.1 kg  77.1 kg    Examination:  Constitutional: NAD, sleeping but easily arousable, oriented HEENT: conjunctivae and lids normal, EOMI CV: No cyanosis.   RESP: no wheezes Extremities: left knee in cooling brace SKIN: warm, dry and intact Neuro: II - XII grossly intact.   Psych: Normal mood and affect.     Data Reviewed: I have personally reviewed following labs and imaging studies  CBC: Recent Labs  Lab 06/11/20 0623 06/11/20 0623 06/12/20 0335 06/13/20 0517 06/14/20 0408 06/15/20 0458  06/16/20 0224  WBC 13.4*   < > 17.0* 13.7* 11.4* 11.2* 12.6*  NEUTROABS 11.5*  --   --   --   --   --   --   HGB 13.5   < > 11.7* 11.9* 13.2 12.2 12.2  HCT 42.4   < > 37.4 38.3 43.3 40.3 39.1  MCV 89.8   < > 90.8 92.5 92.9 93.1 91.8  PLT 195   < > 207 235 244 213 226   < > = values in this interval not displayed.   Basic Metabolic Panel: Recent Labs  Lab 06/12/20 0335 06/13/20 0517 06/14/20 0408 06/15/20 0458 06/16/20 0224  NA 138 137 142 141 140  K 3.7 3.6 3.6 3.6 3.9  CL 103 101 100 101 101  CO2 27 29 32 31 32  GLUCOSE 128* 153* 80 96 104*  BUN 35* 21 17 21 19   CREATININE 0.82 0.73 0.60 0.58 0.60  CALCIUM 7.9* 8.1* 8.8* 8.1* 8.2*  MG 2.4 2.1 2.1 1.8 1.9   GFR: Estimated Creatinine Clearance: 70.8 mL/min (by C-G formula based on SCr of 0.6 mg/dL). Liver Function Tests: Recent Labs  Lab 06/11/20 0623  AST 32  ALT 23  ALKPHOS 117  BILITOT 1.3*  PROT 6.6  ALBUMIN 2.4*   No results for input(s): LIPASE, AMYLASE in the last 168 hours. No results for input(s): AMMONIA in the last 168 hours. Coagulation Profile: Recent Labs  Lab 06/11/20 0623  INR 1.4*   Cardiac Enzymes: No results for input(s): CKTOTAL, CKMB, CKMBINDEX, TROPONINI in the last 168 hours. BNP (last 3 results) No results for input(s): PROBNP in the last 8760 hours. HbA1C: No results for input(s): HGBA1C in the last 72 hours. CBG: No results for input(s): GLUCAP in the last 168 hours. Lipid Profile: No results for input(s): CHOL, HDL, LDLCALC, TRIG, CHOLHDL, LDLDIRECT in the last 72 hours. Thyroid Function Tests: No results for input(s): TSH, T4TOTAL, FREET4, T3FREE, THYROIDAB in the last 72 hours. Anemia Panel: No results for input(s): VITAMINB12, FOLATE, FERRITIN, TIBC, IRON, RETICCTPCT in the last 72 hours. Sepsis Labs: Recent Labs  Lab 06/10/20 1929 06/10/20 2205  PROCALCITON 1.24  --   LATICACIDVEN  --  1.7    Recent Results (from the past 240 hour(s))  Blood culture (routine x 2)      Status: Abnormal   Collection Time: 06/10/20 10:00 PM   Specimen: BLOOD  Result Value Ref Range Status   Specimen Description   Final    BLOOD RIGHT HAND Performed at [facility], [address], [place], [place]    Special Requests   Final    BOTTLES DRAWN AEROBIC AND ANAEROBIC Blood Culture adequate volume Performed at [facility], [address], [place], [place]    Culture  Setup Time   Final    GRAM POSITIVE COCCI IN BOTH AEROBIC AND ANAEROBIC BOTTLES Organism ID to follow CRITICAL RESULT CALLED TO, READ BACK BY AND VERIFIED WITH: KAREN  HAYES 06/11/20 0912 KLW Performed at [facility], [address] [place], [place] [address]    Culture STREPTOCOCCUS PNEUMONIAE (A)  Final   Report Status 06/13/2020 FINAL  Final   Organism ID, Bacteria STREPTOCOCCUS PNEUMONIAE  Final      Susceptibility   Streptococcus pneumoniae - MIC*    ERYTHROMYCIN <=0.12 SENSITIVE Sensitive     LEVOFLOXACIN 0.5 SENSITIVE Sensitive     VANCOMYCIN 0.5 SENSITIVE Sensitive     PENO - penicillin <=0.06      PENICILLIN (non-meningitis) <=0.06 SENSITIVE Sensitive     PENICILLIN (oral) <=0.06 SENSITIVE Sensitive     CEFTRIAXONE (non-meningitis) <=0.12 SENSITIVE Sensitive     * STREPTOCOCCUS PNEUMONIAE  Blood Culture ID Panel (Reflexed)     Status: Abnormal   Collection Time: 06/10/20 10:00 PM  Result Value Ref Range Status   Enterococcus faecalis NOT DETECTED NOT DETECTED Final   Enterococcus Faecium NOT DETECTED NOT DETECTED Final   Listeria monocytogenes NOT DETECTED NOT DETECTED Final   Staphylococcus species NOT DETECTED NOT DETECTED Final   Staphylococcus aureus (BCID) NOT DETECTED NOT DETECTED Final   Staphylococcus epidermidis NOT DETECTED NOT DETECTED Final   Staphylococcus lugdunensis NOT DETECTED NOT DETECTED Final   Streptococcus species DETECTED (A) NOT DETECTED Final    Comment: CRITICAL RESULT CALLED TO, READ BACK BY AND VERIFIED  WITH: KAREN HAYES 06/11/20 0912 KLW    Streptococcus agalactiae NOT DETECTED NOT DETECTED Final   Streptococcus pneumoniae DETECTED (A) NOT DETECTED Final    Comment: CRITICAL RESULT CALLED TO, READ BACK BY AND VERIFIED WITH: KAREN HAYES 06/11/20 0912 KLW    Streptococcus pyogenes NOT DETECTED NOT DETECTED Final   A.calcoaceticus-baumannii NOT DETECTED NOT DETECTED Final   Bacteroides fragilis NOT DETECTED NOT DETECTED Final   Enterobacterales NOT DETECTED NOT DETECTED Final   Enterobacter cloacae complex NOT DETECTED NOT DETECTED Final   Escherichia coli NOT DETECTED NOT DETECTED Final   Klebsiella aerogenes NOT DETECTED NOT DETECTED Final   Klebsiella oxytoca NOT DETECTED NOT DETECTED Final   Klebsiella pneumoniae NOT DETECTED NOT DETECTED Final   Proteus species NOT DETECTED NOT DETECTED Final   Salmonella species NOT DETECTED NOT DETECTED Final   Serratia marcescens NOT DETECTED NOT DETECTED Final   Haemophilus influenzae NOT DETECTED NOT DETECTED Final   Neisseria meningitidis NOT DETECTED NOT DETECTED Final   Pseudomonas aeruginosa NOT DETECTED NOT DETECTED Final   Stenotrophomonas maltophilia NOT DETECTED NOT DETECTED Final   Candida albicans NOT DETECTED NOT DETECTED Final   Candida auris NOT DETECTED NOT DETECTED Final   Candida glabrata NOT DETECTED NOT DETECTED Final   Candida krusei NOT DETECTED NOT DETECTED Final   Candida parapsilosis NOT DETECTED NOT DETECTED Final   Candida tropicalis NOT DETECTED NOT DETECTED Final   Cryptococcus neoformans/gattii NOT DETECTED NOT DETECTED Final    Comment: Performed at [facility], [address] [place], [place] [address]  Respiratory Panel by RT PCR (Flu A&B, Covid) - Nasopharyngeal Swab     Status: None   Collection Time: 06/10/20 10:05 PM   Specimen: Nasopharyngeal Swab  Result Value Ref Range Status   SARS Coronavirus 2 by RT PCR NEGATIVE NEGATIVE Final    Comment: (NOTE) SARS-CoV-2 target nucleic acids are NOT  DETECTED.  The SARS-CoV-2 RNA is generally detectable in upper respiratoy specimens during the acute phase of infection. The lowest concentration of SARS-CoV-2 viral copies this assay can detect is 131 copies/mL. A negative result does not preclude SARS-Cov-2 infection and should  not be used as the sole basis for treatment or other patient management decisions. A negative result may occur with  improper specimen collection/handling, submission of specimen other than nasopharyngeal swab, presence of viral mutation(s) within the areas targeted by this assay, and inadequate number of viral copies (<131 copies/mL). A negative result must be combined with clinical observations, patient history, and epidemiological information. The expected result is Negative.  Fact Sheet for Patients:  https://www.moore.com/  Fact Sheet for Healthcare Providers:  https://www.young.biz/  This test is no t yet approved or cleared by the [place] FDA and  has been authorized for detection and/or diagnosis of SARS-CoV-2 by FDA under an Emergency Use Authorization (EUA). This EUA will remain  in effect (meaning this test can be used) for the duration of the COVID-19 declaration under Section 564(b)(1) of the Act, 21 U.S.C. section 360bbb-3(b)(1), unless the authorization is terminated or revoked sooner.     Influenza A by PCR NEGATIVE NEGATIVE Final   Influenza B by PCR NEGATIVE NEGATIVE Final    Comment: (NOTE) The Xpert Xpress SARS-CoV-2/FLU/RSV assay is intended as an aid in  the diagnosis of influenza from Nasopharyngeal swab specimens and  should not be used as a sole basis for treatment. Nasal washings and  aspirates are unacceptable for Xpert Xpress SARS-CoV-2/FLU/RSV  testing.  Fact Sheet for Patients: https://www.moore.com/  Fact Sheet for Healthcare Providers: https://www.young.biz/  This test is not yet  approved or cleared by the [place] FDA and  has been authorized for detection and/or diagnosis of SARS-CoV-2 by  FDA under an Emergency Use Authorization (EUA). This EUA will remain  in effect (meaning this test can be used) for the duration of the  Covid-19 declaration under Section 564(b)(1) of the Act, 21  U.S.C. section 360bbb-3(b)(1), unless the authorization is  terminated or revoked. Performed at [facility], [address] [place], [place] [address]   Blood culture (routine x 2)     Status: Abnormal   Collection Time: 06/10/20 10:05 PM   Specimen: BLOOD  Result Value Ref Range Status   Specimen Description   Final    BLOOD LEFT [facility] Performed at [facility], [address] [place], [place] [address]    Special Requests   Final    BOTTLES DRAWN AEROBIC AND ANAEROBIC Blood Culture adequate volume Performed at [facility], [address] [place], [place] [address]    Culture  Setup Time   Final    GRAM POSITIVE COCCI IN BOTH AEROBIC AND ANAEROBIC BOTTLES CRITICAL RESULT CALLED TO, READ BACK BY AND VERIFIED WITH: KAREN HAYES 06/11/20 0912 KLW Performed at [facility], [address] [place], [place] [address]    Culture (A)  Final    STREPTOCOCCUS PNEUMONIAE SUSCEPTIBILITIES PERFORMED ON PREVIOUS CULTURE WITHIN THE LAST 5 DAYS. Performed at [facility], [address], [place], [place] [address]    Report Status 06/13/2020 FINAL  Final  Body fluid culture     Status: None   Collection Time: 06/10/20 10:05 PM   Specimen: KNEE; Body Fluid  Result Value Ref Range Status   Specimen Description   Final    KNEE RIGHT Performed at [facility], [address], [place], [place] [address]    Special Requests   Final    NONE Performed at [facility], [address] [place], [place] [address]    Gram Stain   Final    ABUNDANT WBC PRESENT, PREDOMINANTLY PMN FEW  GRAM POSITIVE COCCI RESULT CALLED  TO, READ BACK BY AND VERIFIED WITHSolon Augusta RN 06/11/20 0556 JDW Performed at [facility], [address] [place], [place] [address]    Culture FEW STREPTOCOCCUS PNEUMONIAE  Final   Report Status 06/13/2020 FINAL  Final   Organism ID, Bacteria STREPTOCOCCUS PNEUMONIAE  Final      Susceptibility   Streptococcus pneumoniae - MIC*    ERYTHROMYCIN <=0.12 SENSITIVE Sensitive     LEVOFLOXACIN 0.5 SENSITIVE Sensitive     VANCOMYCIN 0.5 SENSITIVE Sensitive     PENO - penicillin <=0.06      PENICILLIN (non-meningitis) <=0.06 SENSITIVE Sensitive     PENICILLIN (oral) <=0.06 SENSITIVE Sensitive     CEFTRIAXONE (non-meningitis) <=0.12 SENSITIVE Sensitive     * FEW STREPTOCOCCUS PNEUMONIAE  Aerobic/Anaerobic Culture (surgical/deep wound)     Status: None   Collection Time: 06/11/20  1:40 PM   Specimen: PATH Soft tissue  Result Value Ref Range Status   Specimen Description   Final    WOUND Performed at [facility], [address], [place], [place] [address]    Special Requests   Final    LEFT KNEE WOUND CULTURE Performed at [facility], [address] [place], [place] [address]    Gram Stain   Final    FEW WBC PRESENT,BOTH PMN AND MONONUCLEAR RARE GRAM POSITIVE COCCI    Culture   Final    RARE STREPTOCOCCUS PNEUMONIAE NO ANAEROBES ISOLATED Performed at [facility], [address], [place], [place] [address]    Report Status 06/16/2020 FINAL  Final   Organism ID, Bacteria STREPTOCOCCUS PNEUMONIAE  Final      Susceptibility   Streptococcus pneumoniae - MIC*    ERYTHROMYCIN <=0.12 SENSITIVE Sensitive     LEVOFLOXACIN 0.5 SENSITIVE Sensitive     VANCOMYCIN 0.5 SENSITIVE Sensitive     PENO - penicillin <=0.06      PENICILLIN (non-meningitis) <=0.06 SENSITIVE Sensitive     PENICILLIN (oral) <=0.06 SENSITIVE Sensitive     CEFTRIAXONE (non-meningitis) <=0.12 SENSITIVE Sensitive     * RARE STREPTOCOCCUS PNEUMONIAE  Aerobic/Anaerobic Culture  (surgical/deep wound)     Status: None (Preliminary result)   Collection Time: 06/11/20  1:41 PM   Specimen: PATH Soft tissue  Result Value Ref Range Status   Specimen Description   Final    TISSUE Performed at [facility], [address], [place], [place] [address]    Special Requests   Final    LEFT KNEE TISSUE Performed at [facility], [address] [place], [place] [address]    Gram Stain NO WBC SEEN NO ORGANISMS SEEN   Final   Culture   Final    RARE STREPTOCOCCUS PNEUMONIAE SUSCEPTIBILITIES TO FOLLOW NO ANAEROBES ISOLATED Performed at [facility], [address], [place], [place] [address]    Report Status PENDING  Incomplete  CULTURE, BLOOD (ROUTINE X 2) w Reflex to ID Panel     Status: None (Preliminary result)   Collection Time: 06/13/20 12:26 AM   Specimen: BLOOD  Result Value Ref Range Status   Specimen Description BLOOD BLOOD RIGHT HAND  Final   Special Requests   Final    BOTTLES DRAWN AEROBIC AND ANAEROBIC Blood Culture results may not be optimal due to an excessive volume of blood received in culture bottles   Culture   Final    NO GROWTH 3 DAYS Performed at [facility], [address] [place], [place] [address]    Report Status PENDING  Incomplete  CULTURE, BLOOD (ROUTINE X 2) w Reflex to ID Panel     Status: None (Preliminary result)   Collection Time: 06/13/20 12:33 AM   Specimen: BLOOD  Result Value Ref Range Status   Specimen Description BLOOD LEFT ANTECUBITAL  Final   Special Requests   Final    BOTTLES DRAWN AEROBIC AND ANAEROBIC Blood Culture results may not be optimal due to an excessive volume of blood received in culture bottles   Culture   Final    NO GROWTH 3 DAYS Performed at [facility], [address], [place], [place] [address]    Report Status PENDING  Incomplete      Radiology Studies: [place] EKG SITE RITE  Result Date: 06/16/2020 If Site Rite image not attached, placement could  not be confirmed due to current cardiac rhythm.    Scheduled Meds: . atorvastatin  20 mg Oral q1800  . buprenorphine  2 patch Transdermal Weekly  . busPIRone  5 mg Oral TID  . Chlorhexidine Gluconate Cloth  6 each Topical Daily  . docusate sodium  100 mg Oral BID  . enoxaparin (LOVENOX) injection  40 mg Subcutaneous Q24H  . feeding supplement  237 mL Oral BID BM  . guaiFENesin  600 mg Oral BID  . ipratropium-albuterol  3 mL Nebulization TID  . multivitamin with minerals  1 tablet Oral Daily  . polyethylene glycol  34 g Oral BID  . sertraline  50 mg Oral Daily  . sodium chloride flush  10-40 mL Intracatheter Q12H  . topiramate  25 mg Oral BID   Continuous Infusions: . cefTRIAXone (ROCEPHIN)  IV 2 g (06/16/20 1203)     LOS: 6 days     Darlin Priestly, MD [facility] If 7PM-7AM, please contact night-coverage 06/16/2020, 9:35 PM

## 2020-06-16 NOTE — TOC Progression Note (Signed)
Transition of Care [facility]) - Progression Note    Patient Details  Name: MADALAINE PORTIER MRN: [id number] Date of Birth: [birth year]  Transition of [facility]) CM/SW Contact  Trenton Founds, RN Phone Number: 06/16/2020, 10:13 AM  Clinical Narrative:   RNCM presented bed offers to patient along with providing Medicare.gov compare list. RNCM will follow back up for bed choice.     Expected Discharge Plan: Skilled Nursing Facility Barriers to Discharge: No Barriers Identified  Expected Discharge Plan and Services Expected Discharge Plan: Skilled Nursing Facility     Post Acute Care Choice: Skilled Nursing Facility Living arrangements for the past 2 months: Single Family Home                                       Social Determinants of Health (SDOH) Interventions    Readmission Risk Interventions No flowsheet data found.

## 2020-06-16 NOTE — Progress Notes (Signed)
Subjective: 5 Days Post-Op Procedure(s) (LRB): IRRIGATION AND DEBRIDEMENT KNEE WITH POLY EXCHANGE (N/A) Patient reports pain as moderate in her knee but denies any increase in pain in the past 24 hours.   Patient is well and denies any increase in SOB.  Being treated for pneumonia.   PT and Care management to assist with discharge planning.  Plan is for d/c to SNF. Fever: No recent fever Gastrointestinal: Reports some mild nausea this AM.  Objective: Vital signs in last 24 hours: Temp:  [97.8 F (36.6 C)-98 F (36.7 C)] 98 F (36.7 C) (10/18 0043) Pulse Rate:  [96-105] 105 (10/18 0043) Resp:  [16-17] 17 (10/18 0043) BP: (119-132)/(76-84) 119/76 (10/18 0043) SpO2:  [95 %-98 %] 95 % (10/18 0043)  Intake/Output from previous day:  Intake/Output Summary (Last 24 hours) at 06/16/2020 0725 Last data filed at 06/15/2020 2103 Gross per 24 hour  Intake 360 ml  Output 750 ml  Net -390 ml    Intake/Output this shift: No intake/output data recorded.  Labs: Recent Labs    06/14/20 0408 06/15/20 0458 06/16/20 0224  HGB 13.2 12.2 12.2   Recent Labs    06/15/20 0458 06/16/20 0224  WBC 11.2* 12.6*  RBC 4.33 4.26  HCT 40.3 39.1  PLT 213 226   Recent Labs    06/15/20 0458 06/16/20 0224  NA 141 140  K 3.6 3.9  CL 101 101  CO2 31 32  BUN 21 19  CREATININE 0.58 0.60  GLUCOSE 96 104*  CALCIUM 8.1* 8.2*   No results for input(s): LABPT, INR in the last 72 hours.   EXAM General - Patient is Alert, Appropriate and Oriented Extremity - ABD soft Sensation intact distally Intact pulses distally Dorsiflexion/Plantar flexion intact Incision: dressing C/D/I No cellulitis present  Minimal effusion to the left knee.  No erythema or increased warmth with palpation. Dressing/Incision - clean, dry, no drainage Motor Function - intact, moving foot and toes well on exam.    Past Medical History:  Diagnosis Date  . Anxiety   . Asthma   . Chronic pain    lower back  .  Chronic systolic CHF (congestive heart failure) (HCC) 09/22/2019  . COPD (chronic obstructive pulmonary disease) (HCC)   . Depression   . DJD (degenerative joint disease)   . Fibromyalgia   . GERD (gastroesophageal reflux disease)   . Headache   . Hyperlipidemia   . Lower extremity edema   . On home oxygen therapy    has not been on since 2016  . Panic attack   . Shortness of breath dyspnea   . Sleep disorder     Assessment/Plan: 5 Days Post-Op Procedure(s) (LRB): IRRIGATION AND DEBRIDEMENT KNEE WITH POLY EXCHANGE (N/A) Active Problems:   CAP (community acquired pneumonia)  Estimated body mass index is 30.11 kg/m as calculated from the following:   Height as of this encounter: 5\' 3"  (1.6 m).   Weight as of this encounter: 77.1 kg. Advance diet Up with therapy   Labs reviewed, WBC 12.6, slight increase from yesterday, denies any increase in knee pain or SOB.  No recent fevers. Up with therapy.  Patient has had a BM. Cultures from surgery without growth at this time, continue Rocephin per ID. Patient will need SNF upon discharge. Plan for follow-up with [facility] orthopaedics in 10-14 days for staple removal and skin check.  DVT Prophylaxis - Lovenox and Foot Pumps Weight-Bearing as tolerated to left leg  J. 05-25-1979, PA-C [facility] Orthopaedic  Surgery 06/16/2020, 4:41 pm

## 2020-06-16 NOTE — Progress Notes (Signed)
Physical Therapy Treatment Patient Details Name: Bethany Villa MRN: [id number] DOB: [birth year] Today's Date: 06/16/2020    History of Present Illness Bethany Villa is a 64 y/o female who ws admitted with c/o knee pain/swelling and SOB with associated cough with clear sputum. Pt underwent L knee debridement with antibiotic bead placement on 10/13. 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    PT Comments    Participated in exercises as described below.  In and OOB with min a x 1 for LLE management.  Stood with min a x 1 with increased time and much encouragement to stand.  She is able to sidestep along bed with min a x 1 with somewhat improved steps over yesterday but remains self limiting and refuses to sit in recliner after mobility attempts.  Education provided regarding importance of mobility/sitting and voiced understanding but again declined.   Follow Up Recommendations  SNF     Equipment Recommendations  Rolling walker with 5" wheels;3in1 (PT)    Recommendations for Other Services       Precautions / Restrictions Precautions Precautions: Fall;Knee Precaution Booklet Issued: No Restrictions Weight Bearing Restrictions: Yes LLE Weight Bearing: Weight bearing as tolerated    Mobility  Bed Mobility Overal bed mobility: Needs Assistance Bed Mobility: Supine to Sit;Sit to Supine     Supine to sit: Min assist;HOB elevated Sit to supine: Min assist;HOB elevated   General bed mobility comments: minA for LLE management  Transfers Overall transfer level: Needs assistance Equipment used: Rolling walker (2 wheeled) Transfers: Sit to/from Stand           General transfer comment: encouragement to stand  Ambulation/Gait Ambulation/Gait assistance: Min assist Gait Distance (Feet): 3 Feet Assistive device: Rolling walker (2 wheeled) Gait Pattern/deviations: Step-to pattern Gait velocity: decreased    General Gait Details: refused recliner today but did sidestep along bed to get higher up before sitting.  self limits due to pain.   Stairs             Wheelchair Mobility    Modified Rankin (Stroke Patients Only)       Balance Overall balance assessment: Needs assistance Sitting-balance support: Feet supported;Bilateral upper extremity supported Sitting balance-Leahy Scale: Good     Standing balance support: Bilateral upper extremity supported;During functional activity Standing balance-Leahy Scale: Fair Standing balance comment: CGA for balance with BUE on RW for support                            Cognition Arousal/Alertness: Awake/alert Behavior During Therapy: WFL for tasks assessed/performed Overall Cognitive Status: Within Functional Limits for tasks assessed                                 General Comments: Pt alert/oriented x 4.      Exercises General Exercises - Lower Extremity Ankle Circles/Pumps: AROM;Both;10 reps Quad Sets: AROM;Strengthening;Left;10 reps Short Arc Quad: AROM;Strengthening;Left;10 reps Long Arc Quad: AROM;Strengthening;Left;10 reps Heel Slides: AAROM;Strengthening;Left;10 reps Hip ABduction/ADduction: AAROM;Strengthening;Left;10 reps    General Comments        Pertinent Vitals/Pain Pain Assessment: Faces Faces Pain Scale: Hurts whole lot Pain Location: L knee Pain Descriptors / Indicators: Aching;Discomfort;Guarding;Grimacing Pain Intervention(s): Limited activity within patient's tolerance;Premedicated before session;Repositioned;Ice applied    Home Living  Prior Function            PT Goals (current goals can now be found in the care plan section) Progress towards PT goals: Progressing toward goals    Frequency    7X/week      PT Plan Current plan remains appropriate    Co-evaluation              AM-PAC PT "6 Clicks" Mobility   Outcome Measure   Help needed turning from your back to your side while in a flat bed without using bedrails?: A Little Help needed moving from lying on your back to sitting on the side of a flat bed without using bedrails?: A Little Help needed moving to and from a bed to a chair (including a wheelchair)?: A Little Help needed standing up from a chair using your arms (e.g., wheelchair or bedside chair)?: A Little Help needed to walk in hospital room?: A Little Help needed climbing 3-5 steps with a railing? : A Lot 6 Click Score: 17    End of Session Equipment Utilized During Treatment: Gait belt;Oxygen Activity Tolerance: Patient tolerated treatment well;Patient limited by pain Patient left: in bed;with call bell/phone within reach;with bed alarm set Nurse Communication: Mobility status PT Visit Diagnosis: Unsteadiness on feet (R26.81);Muscle weakness (generalized) (M62.81);Pain Pain - Right/Left: Left Pain - part of body: Knee     Time: 1916-6060 PT Time Calculation (min) (ACUTE ONLY): 23 min  Charges:  $Therapeutic Exercise: 8-22 mins $Therapeutic Activity: 8-22 mins                    Danielle Dess, PTA 06/16/20, 12:46 PM

## 2020-06-17 ENCOUNTER — Encounter: Payer: Medicare Other | Admitting: Student in an Organized Health Care Education/Training Program

## 2020-06-17 DIAGNOSIS — J189 Pneumonia, unspecified organism: Secondary | ICD-10-CM | POA: Diagnosis not present

## 2020-06-17 LAB — MAGNESIUM: Magnesium: 1.8 mg/dL (ref 1.7–2.4)

## 2020-06-17 LAB — CBC
HCT: 37.2 % (ref 36.0–46.0)
Hemoglobin: 11.4 g/dL — ABNORMAL LOW (ref 12.0–15.0)
MCH: 28.7 pg (ref 26.0–34.0)
MCHC: 30.6 g/dL (ref 30.0–36.0)
MCV: 93.7 fL (ref 80.0–100.0)
Platelets: 198 10*3/uL (ref 150–400)
RBC: 3.97 MIL/uL (ref 3.87–5.11)
RDW: 14.5 % (ref 11.5–15.5)
WBC: 10.6 10*3/uL — ABNORMAL HIGH (ref 4.0–10.5)
nRBC: 0 % (ref 0.0–0.2)

## 2020-06-17 LAB — BASIC METABOLIC PANEL
Anion gap: 8 (ref 5–15)
BUN: 19 mg/dL (ref 8–23)
CO2: 31 mmol/L (ref 22–32)
Calcium: 8.1 mg/dL — ABNORMAL LOW (ref 8.9–10.3)
Chloride: 100 mmol/L (ref 98–111)
Creatinine, Ser: 0.63 mg/dL (ref 0.44–1.00)
GFR, Estimated: >60 mL/min (ref >60)
Glucose, Bld: 111 mg/dL — ABNORMAL HIGH (ref 70–99)
Potassium: 4.1 mmol/L (ref 3.5–5.1)
Sodium: 139 mmol/L (ref 135–145)

## 2020-06-17 LAB — RESPIRATORY PANEL BY RT PCR (FLU A&B, COVID)
Influenza A by PCR: NEGATIVE
Influenza B by PCR: NEGATIVE
SARS Coronavirus 2 by RT PCR: NEGATIVE

## 2020-06-17 LAB — C-REACTIVE PROTEIN: CRP: 4.2 mg/dL — ABNORMAL HIGH (ref <1.0)

## 2020-06-17 LAB — SEDIMENTATION RATE: Sed Rate: 69 mm/hr — ABNORMAL HIGH (ref 0–30)

## 2020-06-17 MED ORDER — SERTRALINE HCL 50 MG PO TABS
100.0000 mg | ORAL_TABLET | Freq: Every day | ORAL | Status: DC
Start: 2020-06-17 — End: 2021-08-11

## 2020-06-17 MED ORDER — ENSURE ENLIVE PO LIQD
237.0000 mL | Freq: Two times a day (BID) | ORAL | 12 refills | Status: DC
Start: 2020-06-17 — End: 2021-08-11

## 2020-06-17 MED ORDER — OXYCODONE HCL 5 MG PO TABS: 5.0000 mg | ORAL_TABLET | Freq: Four times a day (QID) | ORAL | 0 refills | Status: DC | PRN

## 2020-06-17 MED ORDER — CEFTRIAXONE IV (FOR PTA / DISCHARGE USE ONLY): 2.0000 g | INTRAVENOUS | 0 refills | Status: AC

## 2020-06-17 MED ORDER — POLYETHYLENE GLYCOL 3350 17 G PO PACK
34.0000 g | PACK | Freq: Two times a day (BID) | ORAL | 0 refills | Status: DC
Start: 2020-06-17 — End: 2021-08-11

## 2020-06-17 MED ORDER — TRAZODONE HCL 50 MG PO TABS: 50.0000 mg | ORAL_TABLET | Freq: Every evening | ORAL | Status: DC | PRN

## 2020-06-17 MED ORDER — ADULT MULTIVITAMIN W/MINERALS CH
1.0000 | ORAL_TABLET | Freq: Every day | ORAL | Status: DC
Start: 2020-06-17 — End: 2021-08-11

## 2020-06-17 MED ORDER — IPRATROPIUM-ALBUTEROL 0.5-2.5 (3) MG/3ML IN SOLN: 3.0000 mL | Freq: Four times a day (QID) | RESPIRATORY_TRACT | Status: DC | PRN

## 2020-06-17 NOTE — Care Management Important Message (Signed)
Important Message  Patient Details  Name: Bethany Villa MRN: [id number] Date of Birth: 10-04-1955   Medicare Important Message Given:  Yes     [name] 06/17/2020, 10:28 AM

## 2020-06-17 NOTE — TOC Transition Note (Signed)
Transition of Care [facility]) - CM/SW Discharge Note   Patient Details  Name: Bethany Villa MRN: [id number] Date of Birth: August 22, 1956  [facility] CM/SW Contact:  Trenton Founds, RN Phone Number: 06/17/2020, 1:14 PM   Clinical Narrative:   RNCM received insurance authorization from [facility] with auth number of [id number], Navi ref # of [id number] and next review date of 10/20. RNCM reached out to facility and patient can go today. EMS paperwork completed and transport will be arranged when patient is ready.       Barriers to Discharge: No Barriers Identified   Patient Goals and CMS Choice     Choice offered to / list presented to : Patient  Discharge Placement                       Discharge Plan and Services     Post Acute Care Choice: Skilled Nursing Facility                               Social Determinants of Health (SDOH) Interventions     Readmission Risk Interventions No flowsheet data found.

## 2020-06-17 NOTE — Progress Notes (Signed)
Discharge note: Contacted/reported to Staff nurse at [facility]. Spoke to McDonald, Charity fundraiser. Pt d/c with a Picc 1 lumen, Right upper arm.  IV site flushed and have blood returned. Pt continued with antibiotic IVF treatment at [facility].Left knee dressing in place. Polar care in place.  Obtained vitals. Pt transported to Facility via EMS.

## 2020-06-17 NOTE — Discharge Summary (Addendum)
Physician Discharge Summary   Bethany Villa  female DOB: 05-Apr-1956  HWE:[id number]  PCP: Marinda Elk, MD  Admit date: 06/10/2020 Discharge date: 06/17/2020  Admitted From: home Disposition:  SNF CODE STATUS: Full code  Discharge Instructions    Discharge instructions   Complete by: As directed    Labs while on IV antibiotic: Labs - Once weekly:  CBC/D,  CMP, CRP Labs - Every other week:  ESR and CRP  Follow up clinic 3 weeks with Dr Delaine Lame  - -   Home infusion instructions   Complete by: As directed    Instructions: Flushing of vascular access device: 0.9% NaCl pre/post medication administration and prn patency; Heparin 100 u/ml, 10m for implanted ports and Heparin 10u/ml, 568mfor all other central venous catheters.   No wound care   Complete by: As directed        Hospital Course:  For full details, please see H&P, progress notes, consult notes and ancillary notes.  Briefly,  PennyMilleris a64 y.o.Caucasian femalewith a known history of asthma, dyslipidemia, fibromyalgia, GERD, COPD, depression and anxiety, who presented to the emergency room with acute onset of dyspnea which has been worsening over the last week with associated cough productive of clear sputum as well as wheezing with nausea without vomiting or abdominal pain.   She was also been having mildly worsening left knee pain with swelling. She denied any insect bites or trauma. 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 She admitted to fever and chills.    # Sepsis 2/2 infections mentioned below pt presented with tachycardia, leukocytosis, elevated procal, and multiple sources of infection as below.  ID consulted, and pt was treated with abx as below.  # Strep pneumoniae bacteremia due to right upper lobe PNA On presentation, CXR showed New large right upper lobe pneumonia.  Blood cx 2 of 2 positive for Strep pneumoniae.  Pt was started on ceftriaxone and  vanc.  vanc since d/c'ed.  2d echo no mention of vegetation.  Repeat blood cx neg.  Pt is continued on ceftriaxone and needs 6 weeks of abx anyways due to prosthetic joint infection, so no need to pursue further workup for endocarditis.  Abx stop date 07/23/20.  Pt will need labs while on IV abx (see above discharge instructions).  Pt will follow up with ID Dr. RaDelaine Lame weeks after discharge.   # Left knee prosthetic joint infection S/p OR washout with polyethylene exchange on 10/13 Per ID, pt needs minimum 6 weeks of IV ceftriaxone 2g daily and then PO antibiotic for a total of 6 months as there is retained hardware.  Pt complained of severe knee pain and was receiving oxycodone 5 mg PRN for pain.  Per ortho, pt is Weight-Bearing as tolerated to left leg and will continue DVT ppx with Lovenox after discharge.  Pt will follow up with ortho 2 weeks after discharge for staple removal.  # Acute on chronic COPD exacerbation # Chronic hypoxic respiratory failure on home 2L O2 "Upon EMS arrival to home, patient had increased work of breathing with audible rhonchi and wheezing, diminished breath sounds on right side, and respirations of 32/min."  On arrival, pulse oximetry was 96% on 4 L of O2.  Pt complained of dyspnea and cough.  COPD exacerbation likely triggered by PNA.  Pt finished steroid course and received DuoNeb QID.  Respiratory status improved, and pt was weaned down to room air at rest.  Depression  and anxiety. on home Zoloft, Topamax, trazodone, BuSpar and Xanax, which were continued.  Dyslipidemia. continued home statin  Chronic pain on chronic opioids pt on Butrans patch at home, Rx confirmed, which was continued.  Her current patch was placed on 06/12/20.    Discharge Diagnoses:  Active Problems:   CAP (community acquired pneumonia)    Discharge Instructions:  Allergies as of 06/17/2020      Reactions   Gabapentin Shortness Of Breath, Rash   Mobic [meloxicam]  Anaphylaxis   Penicillins Shortness Of Breath, Swelling   Has patient had a PCN reaction causing immediate rash, facial/tongue/throat swelling, SOB or lightheadedness with hypotension: unknown Has patient had a PCN reaction causing severe rash involving mucus membranes or skin necrosis: {unknown Has patient had a PCN reaction that required hospitalization {unknown Has patient had a PCN reaction occurring within the last 10 years: no If all of the above answers are "NO", then may proceed with Cephalosporin use.   Sulfa Antibiotics Shortness Of Breath, Swelling      Medication List    STOP taking these medications   ALPRAZolam 0.5 MG tablet Commonly known as: XANAX   guaiFENesin-dextromethorphan 100-10 MG/5ML syrup Commonly known as: ROBITUSSIN DM   phenol 1.4 % Liqd Commonly known as: CHLORASEPTIC     TAKE these medications   acetaminophen 500 MG tablet Commonly known as: TYLENOL Take 2 tablets (1,000 mg total) by mouth every 12 (twelve) hours as needed for mild pain or moderate pain.   atorvastatin 20 MG tablet Commonly known as: LIPITOR TAKE 1 TABLET (20 MG TOTAL) BY MOUTH DAILY.   buprenorphine 20 MCG/HR Ptwk Commonly known as: BUTRANS Place 1 patch onto the skin every 7 (seven) days.   busPIRone 5 MG tablet Commonly known as: BUSPAR Take 5 mg by mouth 3 (three) times daily.   cefTRIAXone  IVPB Commonly known as: ROCEPHIN Inject 2 g into the vein daily. Indication: S. Pneumoniae bacteremia and knee PJI Last Day of Therapy: 07/23/2020 Labs - Once weekly:  CBC/D,  CMP, CRP Labs - Every other week:  ESR and CRP   enoxaparin 40 MG/0.4ML injection Commonly known as: LOVENOX Inject 0.4 mLs (40 mg total) into the skin daily for 14 doses.   feeding supplement Liqd Take 237 mLs by mouth 2 (two) times daily between meals.   furosemide 40 MG tablet Commonly known as: LASIX Take 40 mg by mouth daily as needed.   ipratropium-albuterol 0.5-2.5 (3) MG/3ML Soln Commonly  known as: DUONEB Take 3 mLs by nebulization every 6 (six) hours as needed.   multivitamin with minerals Tabs tablet Take 1 tablet by mouth daily.   ondansetron 4 MG tablet Commonly known as: ZOFRAN Take 4 mg by mouth as needed for nausea/vomiting.   oxyCODONE 5 MG immediate release tablet Commonly known as: Oxy IR/ROXICODONE Take 1 tablet (5 mg total) by mouth every 6 (six) hours as needed for moderate pain (pain score 4-6).   polyethylene glycol 17 g packet Commonly known as: MIRALAX / GLYCOLAX Take 34 g by mouth 2 (two) times daily.   sertraline 50 MG tablet Commonly known as: ZOLOFT Take 2 tablets (100 mg total) by mouth daily.   tiZANidine 4 MG tablet Commonly known as: Zanaflex Take 1.5 tablets (6 mg total) by mouth 2 (two) times daily as needed for muscle spasms.   topiramate 25 MG tablet Commonly known as: TOPAMAX Take 25 mg by mouth 2 (two) times daily.   traZODone 50 MG tablet Commonly known as:  DESYREL Take 1 tablet (50 mg total) by mouth at bedtime as needed for sleep. What changed:   when to take this  reasons to take this   Trelegy Ellipta 100-62.5-25 MCG/INH Aepb Generic drug: Fluticasone-Umeclidin-Vilant Inhale 1 puff into the lungs daily.   Ventolin HFA 108 (90 Base) MCG/ACT inhaler Generic drug: albuterol INHALE 2 PUFFS BY MOUTH EVERY 6 HOURS AS NEEDED FOR WHEEZE What changed: Another medication with the same name was removed. Continue taking this medication, and follow the directions you see here.            Home Infusion Instuctions  (From admission, onward)         Start     Ordered   06/17/20 0000  Home infusion instructions       Question:  Instructions  Answer:  Flushing of vascular access device: 0.9% NaCl pre/post medication administration and prn patency; Heparin 100 u/ml, 26m for implanted ports and Heparin 10u/ml, 547mfor all other central venous catheters.   06/17/20 0910           Follow-up Information    McLattie CornsPA-C. Schedule an appointment as soon as possible for a visit in 2 week(s).   Specialty: Physician Assistant Why: For staple removal Contact information: [phone]        RaTsosie BillingMD. Schedule an appointment as soon as possible for a visit in 3 week(s).   Specialty: Infectious Diseases Contact information: [phone]        McMarinda ElkMD. Schedule an appointment as soon as possible for a visit in 1 week(s).   Specialty: Physician Assistant Contact information: [address] [facility] [place] [phone]             Allergies  Allergen Reactions  . Gabapentin Shortness Of Breath and Rash  . Mobic [Meloxicam] Anaphylaxis  . Penicillins Shortness Of Breath and Swelling    Has patient had a PCN reaction causing immediate rash, facial/tongue/throat swelling, SOB or lightheadedness with hypotension: unknown Has patient had a PCN reaction causing severe rash involving mucus membranes or skin necrosis: {unknown Has patient had a PCN reaction that required hospitalization {unknown Has patient had a PCN reaction occurring within the last 10 years: no If all of the above answers are "NO", then may proceed with Cephalosporin use.  . Sulfa Antibiotics Shortness Of Breath and Swelling     The results of significant diagnostics from this hospitalization (including imaging, microbiology, ancillary and laboratory) are listed below for reference.   Consultations:   Procedures/Studies: DG Chest 2 View  Result Date: 06/10/2020 CLINICAL DATA:  Wheezing. EXAM: CHEST - 2 VIEW COMPARISON:  October 08, 2019. FINDINGS: The heart size and mediastinal contours are within normal limits. No pneumothorax or pleural effusion is noted. Left lung is clear. New large right upper lobe airspace opacity is noted  consistent pneumonia. The visualized skeletal structures are unremarkable. IMPRESSION: New large right upper lobe pneumonia. Electronically Signed   By: JaMarijo Conception.D.   On: 06/10/2020 19:43   DG Knee 2 Views Left  Result Date: 06/10/2020 CLINICAL DATA:  Knee pain EXAM: LEFT KNEE - 2 VIEW COMPARISON:  05/16/2018 FINDINGS: Left knee prosthesis is again seen and stable. No dislocation or acute fracture is noted. Small joint effusion is seen. No other focal abnormality is noted. IMPRESSION: Small joint effusion  without acute bony abnormality. Electronically Signed   By: Inez Catalina M.D.   On: 06/10/2020 18:51   DG Knee Left Port  Result Date: 06/11/2020 CLINICAL DATA:  Status post left knee debridement EXAM: PORTABLE LEFT KNEE - 1-2 VIEW COMPARISON:  06/10/2020 FINDINGS: The prosthesis is again identified and stable. Two surgical drains are noted in place. Multiple radiopaque antibiotic beads are noted surrounding the distal femur. IMPRESSION: Status post debridement and antibiotic bead placement Electronically Signed   By: Inez Catalina M.D.   On: 06/11/2020 17:31   [place] EKG SITE RITE  Result Date: 06/16/2020 If Site Rite image not attached, placement could not be confirmed due to current cardiac rhythm.     Labs: BNP (last 3 results) Recent Labs    09/21/19 1808 10/07/19 1753  BNP 47.0 947.6*   Basic Metabolic Panel: Recent Labs  Lab 06/13/20 0517 06/14/20 0408 06/15/20 0458 06/16/20 0224 06/17/20 0321  NA 137 142 141 140 139  K 3.6 3.6 3.6 3.9 4.1  CL 101 100 101 101 100  CO2 29 32 31 32 31  GLUCOSE 153* 80 96 104* 111*  BUN _0 CREATININE 0.73 0.60 0.58 0.60 0.63  CALCIUM 8.1* 8.8* 8.1* 8.2* 8.1*  MG 2.1 2.1 1.8 1.9 1.8   Liver Function Tests: Recent Labs  Lab 06/11/20 0623  AST 32  ALT 23  ALKPHOS 117  BILITOT 1.3*  PROT 6.6  ALBUMIN 2.4*   No results for input(s): LIPASE, AMYLASE in the last 168 hours. No results for input(s): AMMONIA in  the last 168 hours. CBC: Recent Labs  Lab 06/11/20 0623 06/12/20 0335 06/13/20 0517 06/14/20 0408 06/15/20 0458 06/16/20 0224 06/17/20 0321  WBC 13.4*   < > 13.7* 11.4* 11.2* 12.6* 10.6*  NEUTROABS 11.5*  --   --   --   --   --   --   HGB 13.5   < > 11.9* 13.2 12.2 12.2 11.4*  HCT 42.4   < > 38.3 43.3 40.3 39.1 37.2  MCV 89.8   < > 92.5 92.9 93.1 91.8 93.7  PLT 195   < > 235 244 213 226 198   < > = values in this interval not displayed.   Cardiac Enzymes: No results for input(s): CKTOTAL, CKMB, CKMBINDEX, TROPONINI in the last 168 hours. BNP: Invalid input(s): POCBNP CBG: No results for input(s): GLUCAP in the last 168 hours. D-Dimer No results for input(s): DDIMER in the last 72 hours. Hgb A1c No results for input(s): HGBA1C in the last 72 hours. Lipid Profile No results for input(s): CHOL, HDL, LDLCALC, TRIG, CHOLHDL, LDLDIRECT in the last 72 hours. Thyroid function studies No results for input(s): TSH, T4TOTAL, T3FREE, THYROIDAB in the last 72 hours.  Invalid input(s): FREET3 Anemia work up No results for input(s): VITAMINB12, FOLATE, FERRITIN, TIBC, IRON, RETICCTPCT in the last 72 hours. Urinalysis    Component Value Date/Time   COLORURINE YELLOW (A) 06/13/2020 1302   APPEARANCEUR HAZY (A) 06/13/2020 1302   APPEARANCEUR Hazy 08/22/2013 1111   LABSPEC 1.012 06/13/2020 1302   LABSPEC 1.016 08/22/2013 1111   PHURINE 6.0 06/13/2020 1302   GLUCOSEU NEGATIVE 06/13/2020 1302   GLUCOSEU Negative 08/22/2013 1111   HGBUR NEGATIVE 06/13/2020 1302   BILIRUBINUR NEGATIVE 06/13/2020 1302   BILIRUBINUR Negative 08/22/2013 1111   KETONESUR NEGATIVE 06/13/2020 1302   PROTEINUR NEGATIVE 06/13/2020 1302   UROBILINOGEN 0.2 07/24/2014 0635   NITRITE NEGATIVE 06/13/2020 1302   LEUKOCYTESUR NEGATIVE  06/13/2020 1302   LEUKOCYTESUR 1+ 08/22/2013 1111   Sepsis Labs Invalid input(s): PROCALCITONIN,  WBC,  LACTICIDVEN Microbiology Recent Results (from the past 240 hour(s))   Blood culture (routine x 2)     Status: Abnormal   Collection Time: 06/10/20 10:00 PM   Specimen: BLOOD  Result Value Ref Range Status   Specimen Description   Final    BLOOD RIGHT HAND Performed at [facility], [address], [place], [place] [address]    Special Requests   Final    BOTTLES DRAWN AEROBIC AND ANAEROBIC Blood Culture adequate volume Performed at [facility], [address] [place], [place] [address]    Culture  Setup Time   Final    GRAM POSITIVE COCCI IN BOTH AEROBIC AND ANAEROBIC BOTTLES Organism ID to follow CRITICAL RESULT CALLED TO, READ BACK BY AND VERIFIED WITH: KAREN HAYES 06/11/20 0912 KLW Performed at [facility], [address] [place], [place] [address]    Culture STREPTOCOCCUS PNEUMONIAE (A)  Final   Report Status 06/13/2020 FINAL  Final   Organism ID, Bacteria STREPTOCOCCUS PNEUMONIAE  Final      Susceptibility   Streptococcus pneumoniae - MIC*    ERYTHROMYCIN <=0.12 SENSITIVE Sensitive     LEVOFLOXACIN 0.5 SENSITIVE Sensitive     VANCOMYCIN 0.5 SENSITIVE Sensitive     PENO - penicillin <=0.06      PENICILLIN (non-meningitis) <=0.06 SENSITIVE Sensitive     PENICILLIN (oral) <=0.06 SENSITIVE Sensitive     CEFTRIAXONE (non-meningitis) <=0.12 SENSITIVE Sensitive     * STREPTOCOCCUS PNEUMONIAE  Blood Culture ID Panel (Reflexed)     Status: Abnormal   Collection Time: 06/10/20 10:00 PM  Result Value Ref Range Status   Enterococcus faecalis NOT DETECTED NOT DETECTED Final   Enterococcus Faecium NOT DETECTED NOT DETECTED Final   Listeria monocytogenes NOT DETECTED NOT DETECTED Final   Staphylococcus species NOT DETECTED NOT DETECTED Final   Staphylococcus aureus (BCID) NOT DETECTED NOT DETECTED Final   Staphylococcus epidermidis NOT DETECTED NOT DETECTED Final   Staphylococcus lugdunensis NOT DETECTED NOT DETECTED Final   Streptococcus species DETECTED (A) NOT DETECTED Final    Comment: CRITICAL RESULT CALLED TO,  READ BACK BY AND VERIFIED WITH: KAREN HAYES 06/11/20 0912 KLW    Streptococcus agalactiae NOT DETECTED NOT DETECTED Final   Streptococcus pneumoniae DETECTED (A) NOT DETECTED Final    Comment: CRITICAL RESULT CALLED TO, READ BACK BY AND VERIFIED WITH: KAREN HAYES 06/11/20 0912 KLW    Streptococcus pyogenes NOT DETECTED NOT DETECTED Final   A.calcoaceticus-baumannii NOT DETECTED NOT DETECTED Final   Bacteroides fragilis NOT DETECTED NOT DETECTED Final   Enterobacterales NOT DETECTED NOT DETECTED Final   Enterobacter cloacae complex NOT DETECTED NOT DETECTED Final   Escherichia coli NOT DETECTED NOT DETECTED Final   Klebsiella aerogenes NOT DETECTED NOT DETECTED Final   Klebsiella oxytoca NOT DETECTED NOT DETECTED Final   Klebsiella pneumoniae NOT DETECTED NOT DETECTED Final   Proteus species NOT DETECTED NOT DETECTED Final   Salmonella species NOT DETECTED NOT DETECTED Final   Serratia marcescens NOT DETECTED NOT DETECTED Final   Haemophilus influenzae NOT DETECTED NOT DETECTED Final   Neisseria meningitidis NOT DETECTED NOT DETECTED Final   Pseudomonas aeruginosa NOT DETECTED NOT DETECTED Final   Stenotrophomonas maltophilia NOT DETECTED NOT DETECTED Final   Candida albicans NOT DETECTED NOT DETECTED Final   Candida auris NOT DETECTED NOT DETECTED Final   Candida glabrata NOT DETECTED NOT DETECTED Final  Candida krusei NOT DETECTED NOT DETECTED Final   Candida parapsilosis NOT DETECTED NOT DETECTED Final   Candida tropicalis NOT DETECTED NOT DETECTED Final   Cryptococcus neoformans/gattii NOT DETECTED NOT DETECTED Final    Comment: Performed at [facility], [address] [place], [place] [address]  Respiratory Panel by RT PCR (Flu A&B, Covid) - Nasopharyngeal Swab     Status: None   Collection Time: 06/10/20 10:05 PM   Specimen: Nasopharyngeal Swab  Result Value Ref Range Status   SARS Coronavirus 2 by RT PCR NEGATIVE NEGATIVE Final    Comment: (NOTE) SARS-CoV-2  target nucleic acids are NOT DETECTED.  The SARS-CoV-2 RNA is generally detectable in upper respiratoy specimens during the acute phase of infection. The lowest concentration of SARS-CoV-2 viral copies this assay can detect is 131 copies/mL. A negative result does not preclude SARS-Cov-2 infection and should not be used as the sole basis for treatment or other patient management decisions. A negative result may occur with  improper specimen collection/handling, submission of specimen other than nasopharyngeal swab, presence of viral mutation(s) within the areas targeted by this assay, and inadequate number of viral copies (<131 copies/mL). A negative result must be combined with clinical observations, patient history, and epidemiological information. The expected result is Negative.  Fact Sheet for Patients:  PinkCheek.be  Fact Sheet for Healthcare Providers:  GravelBags.it  This test is no t yet approved or cleared by the [place] FDA and  has been authorized for detection and/or diagnosis of SARS-CoV-2 by FDA under an Emergency Use Authorization (EUA). This EUA will remain  in effect (meaning this test can be used) for the duration of the COVID-19 declaration under Section 564(b)(1) of the Act, 21 U.S.C. section 360bbb-3(b)(1), unless the authorization is terminated or revoked sooner.     Influenza A by PCR NEGATIVE NEGATIVE Final   Influenza B by PCR NEGATIVE NEGATIVE Final    Comment: (NOTE) The Xpert Xpress SARS-CoV-2/FLU/RSV assay is intended as an aid in  the diagnosis of influenza from Nasopharyngeal swab specimens and  should not be used as a sole basis for treatment. Nasal washings and  aspirates are unacceptable for Xpert Xpress SARS-CoV-2/FLU/RSV  testing.  Fact Sheet for Patients: PinkCheek.be  Fact Sheet for Healthcare  Providers: GravelBags.it  This test is not yet approved or cleared by the [place] FDA and  has been authorized for detection and/or diagnosis of SARS-CoV-2 by  FDA under an Emergency Use Authorization (EUA). This EUA will remain  in effect (meaning this test can be used) for the duration of the  Covid-19 declaration under Section 564(b)(1) of the Act, 21  U.S.C. section 360bbb-3(b)(1), unless the authorization is  terminated or revoked. Performed at [facility], [address] [place], [place] [address]   Blood culture (routine x 2)     Status: Abnormal   Collection Time: 06/10/20 10:05 PM   Specimen: BLOOD  Result Value Ref Range Status   Specimen Description   Final    BLOOD LEFT [facility] Performed at [facility], [address] [place], [place] [address]    Special Requests   Final    BOTTLES DRAWN AEROBIC AND ANAEROBIC Blood Culture adequate volume Performed at [facility], [address], [place], [place] [address]    Culture  Setup Time   Final    GRAM POSITIVE COCCI IN BOTH AEROBIC AND ANAEROBIC BOTTLES CRITICAL RESULT CALLED TO, READ BACK BY AND VERIFIED WITH: KAREN HAYES 06/11/20 0912  KLW Performed at [facility], [address] [place], [place] [address]    Culture (A)  Final    STREPTOCOCCUS PNEUMONIAE SUSCEPTIBILITIES PERFORMED ON PREVIOUS CULTURE WITHIN THE LAST 5 DAYS. Performed at [facility], [place] [address], [place], [place] [address]    Report Status 06/13/2020 FINAL  Final  Body fluid culture     Status: None   Collection Time: 06/10/20 10:05 PM   Specimen: KNEE; Body Fluid  Result Value Ref Range Status   Specimen Description   Final    KNEE RIGHT Performed at [facility], [address] [place], [place] [address]    Special Requests   Final    NONE Performed at [facility], [address] [place], [place] [address]    Gram Stain   Final     ABUNDANT WBC PRESENT, PREDOMINANTLY PMN FEW GRAM POSITIVE COCCI RESULT CALLED TO, READ BACK BY AND VERIFIED WITHDouglass Rivers RN 06/11/20 0556 JDW Performed at [facility], [place] [address], [place], [place] [address]    Culture FEW STREPTOCOCCUS PNEUMONIAE  Final   Report Status 06/13/2020 FINAL  Final   Organism ID, Bacteria STREPTOCOCCUS PNEUMONIAE  Final      Susceptibility   Streptococcus pneumoniae - MIC*    ERYTHROMYCIN <=0.12 SENSITIVE Sensitive     LEVOFLOXACIN 0.5 SENSITIVE Sensitive     VANCOMYCIN 0.5 SENSITIVE Sensitive     PENO - penicillin <=0.06      PENICILLIN (non-meningitis) <=0.06 SENSITIVE Sensitive     PENICILLIN (oral) <=0.06 SENSITIVE Sensitive     CEFTRIAXONE (non-meningitis) <=0.12 SENSITIVE Sensitive     * FEW STREPTOCOCCUS PNEUMONIAE  Aerobic/Anaerobic Culture (surgical/deep wound)     Status: None   Collection Time: 06/11/20  1:40 PM   Specimen: PATH Soft tissue  Result Value Ref Range Status   Specimen Description   Final    WOUND Performed at [facility], [address] [place], [place] [address]    Special Requests   Final    LEFT KNEE WOUND CULTURE Performed at [facility], [address] [place], [place] [address]    Gram Stain   Final    FEW WBC PRESENT,BOTH PMN AND MONONUCLEAR RARE GRAM POSITIVE COCCI    Culture   Final    RARE STREPTOCOCCUS PNEUMONIAE NO ANAEROBES ISOLATED Performed at [facility], [place] [address] [place], [place] [address]    Report Status 06/16/2020 FINAL  Final   Organism ID, Bacteria STREPTOCOCCUS PNEUMONIAE  Final      Susceptibility   Streptococcus pneumoniae - MIC*    ERYTHROMYCIN <=0.12 SENSITIVE Sensitive     LEVOFLOXACIN 0.5 SENSITIVE Sensitive     VANCOMYCIN 0.5 SENSITIVE Sensitive     PENO - penicillin <=0.06      PENICILLIN (non-meningitis) <=0.06 SENSITIVE Sensitive     PENICILLIN (oral) <=0.06 SENSITIVE Sensitive     CEFTRIAXONE (non-meningitis) <=0.12 SENSITIVE  Sensitive     * RARE STREPTOCOCCUS PNEUMONIAE  Aerobic/Anaerobic Culture (surgical/deep wound)     Status: None (Preliminary result)   Collection Time: 06/11/20  1:41 PM   Specimen: PATH Soft tissue  Result Value Ref Range Status   Specimen Description   Final    TISSUE Performed at [facility], [address], [place], [place] [address]    Special Requests   Final    LEFT KNEE TISSUE Performed at [facility], [address], [place], [place] [address]  Gram Stain NO WBC SEEN NO ORGANISMS SEEN   Final   Culture   Final    RARE STREPTOCOCCUS PNEUMONIAE SUSCEPTIBILITIES TO FOLLOW NO ANAEROBES ISOLATED Performed at [facility], [place] [address], [place], [place] [address]    Report Status PENDING  Incomplete  CULTURE, BLOOD (ROUTINE X 2) w Reflex to ID Panel     Status: None (Preliminary result)   Collection Time: 06/13/20 12:26 AM   Specimen: BLOOD  Result Value Ref Range Status   Specimen Description BLOOD BLOOD RIGHT HAND  Final   Special Requests   Final    BOTTLES DRAWN AEROBIC AND ANAEROBIC Blood Culture results may not be optimal due to an excessive volume of blood received in culture bottles   Culture   Final    NO GROWTH 4 DAYS Performed at [facility], [address] [place], [place] [address]    Report Status PENDING  Incomplete  CULTURE, BLOOD (ROUTINE X 2) w Reflex to ID Panel     Status: None (Preliminary result)   Collection Time: 06/13/20 12:33 AM   Specimen: BLOOD  Result Value Ref Range Status   Specimen Description BLOOD LEFT ANTECUBITAL  Final   Special Requests   Final    BOTTLES DRAWN AEROBIC AND ANAEROBIC Blood Culture results may not be optimal due to an excessive volume of blood received in culture bottles   Culture   Final    NO GROWTH 4 DAYS Performed at [facility], [address], [place], [place] [address]    Report Status PENDING  Incomplete     Total time spend on discharging this  patient, including the last patient exam, discussing the hospital stay, instructions for ongoing care as it relates to all pertinent caregivers, as well as preparing the medical discharge records, prescriptions, and/or referrals as applicable, is 35 minutes.    Enzo Bi, MD  [facility] 06/17/2020, 9:13 AM  If 7PM-7AM, please contact night-coverage

## 2020-06-18 ENCOUNTER — Encounter: Payer: Medicare Other | Admitting: Student in an Organized Health Care Education/Training Program

## 2020-06-18 LAB — AEROBIC/ANAEROBIC CULTURE W GRAM STAIN (SURGICAL/DEEP WOUND): Gram Stain: NONE SEEN

## 2020-06-18 LAB — CULTURE, BLOOD (ROUTINE X 2)
Culture: NO GROWTH
Culture: NO GROWTH

## 2020-06-24 ENCOUNTER — Telehealth: Payer: Self-pay

## 2020-06-24 ENCOUNTER — Inpatient Hospital Stay: Payer: Medicare Other | Admitting: Infectious Diseases

## 2020-06-24 NOTE — Telephone Encounter (Signed)
Called to verify pt is coming to appt today and spoke to [employer] rep BETTY who states patient has refused to come to her appt today. I explained the importance of this appt and asked if patient is aware of the need to keep this appt and what we do? She said she has no idea and we rescheduled for 07/10/20. Patient is listed to remain on antibiotics through 07/23/20. I did also leave message for the patients Nurse and requested that they keep all appointments or call us ahead. I asked them to please send weekly labs to [place] also and left both fax numbers.

## 2020-06-27 ENCOUNTER — Other Ambulatory Visit: Payer: Self-pay

## 2020-06-27 ENCOUNTER — Emergency Department
Admission: EM | Admit: 2020-06-27 | Discharge: 2020-06-28 | Disposition: A | Discharge status: Skilled Nursing Facility | Payer: Medicare Other | Attending: Emergency Medicine | Admitting: Emergency Medicine

## 2020-06-27 ENCOUNTER — Emergency Department: Payer: Medicare Other

## 2020-06-27 DIAGNOSIS — Z20822 Contact with and (suspected) exposure to covid-19: Secondary | ICD-10-CM | POA: Insufficient documentation

## 2020-06-27 DIAGNOSIS — Z79899 Other long term (current) drug therapy: Secondary | ICD-10-CM | POA: Diagnosis not present

## 2020-06-27 DIAGNOSIS — J441 Chronic obstructive pulmonary disease with (acute) exacerbation: Secondary | ICD-10-CM | POA: Insufficient documentation

## 2020-06-27 DIAGNOSIS — F17210 Nicotine dependence, cigarettes, uncomplicated: Secondary | ICD-10-CM | POA: Insufficient documentation

## 2020-06-27 DIAGNOSIS — J45909 Unspecified asthma, uncomplicated: Secondary | ICD-10-CM | POA: Insufficient documentation

## 2020-06-27 DIAGNOSIS — I5022 Chronic systolic (congestive) heart failure: Secondary | ICD-10-CM | POA: Insufficient documentation

## 2020-06-27 DIAGNOSIS — Z96652 Presence of left artificial knee joint: Secondary | ICD-10-CM | POA: Insufficient documentation

## 2020-06-27 DIAGNOSIS — I959 Hypotension, unspecified: Secondary | ICD-10-CM | POA: Insufficient documentation

## 2020-06-27 LAB — CBC WITH DIFFERENTIAL/PLATELET
Abs Immature Granulocytes: 0.02 10*3/uL (ref 0.00–0.07)
Basophils Absolute: 0 10*3/uL (ref 0.0–0.1)
Basophils Relative: 0 %
Eosinophils Absolute: 0.1 10*3/uL (ref 0.0–0.5)
Eosinophils Relative: 1 %
HCT: 31.7 % — ABNORMAL LOW (ref 36.0–46.0)
Hemoglobin: 9.9 g/dL — ABNORMAL LOW (ref 12.0–15.0)
Immature Granulocytes: 0 %
Lymphocytes Relative: 41 %
Lymphs Abs: 2.2 10*3/uL (ref 0.7–4.0)
MCH: 28.8 pg (ref 26.0–34.0)
MCHC: 31.2 g/dL (ref 30.0–36.0)
MCV: 92.2 fL (ref 80.0–100.0)
Monocytes Absolute: 0.6 10*3/uL (ref 0.1–1.0)
Monocytes Relative: 11 %
Neutro Abs: 2.5 10*3/uL (ref 1.7–7.7)
Neutrophils Relative %: 47 %
Platelets: 55 10*3/uL — ABNORMAL LOW (ref 150–400)
RBC: 3.44 MIL/uL — ABNORMAL LOW (ref 3.87–5.11)
RDW: 14.5 % (ref 11.5–15.5)
WBC: 5.3 10*3/uL (ref 4.0–10.5)
nRBC: 0 % (ref 0.0–0.2)

## 2020-06-27 LAB — LACTIC ACID, PLASMA
Lactic Acid, Venous: 2 mmol/L (ref 0.5–1.9)
Lactic Acid, Venous: 1.2 mmol/L (ref 0.5–1.9)

## 2020-06-27 LAB — COMPREHENSIVE METABOLIC PANEL
ALT: 45 U/L — ABNORMAL HIGH (ref 0–44)
AST: 43 U/L — ABNORMAL HIGH (ref 15–41)
Albumin: 2.1 g/dL — ABNORMAL LOW (ref 3.5–5.0)
Alkaline Phosphatase: 121 U/L (ref 38–126)
Anion gap: 10 (ref 5–15)
BUN: 15 mg/dL (ref 8–23)
CO2: 22 mmol/L (ref 22–32)
Calcium: 8.1 mg/dL — ABNORMAL LOW (ref 8.9–10.3)
Chloride: 105 mmol/L (ref 98–111)
Creatinine, Ser: 0.8 mg/dL (ref 0.44–1.00)
GFR, Estimated: >60 mL/min (ref >60)
Glucose, Bld: 142 mg/dL — ABNORMAL HIGH (ref 70–99)
Potassium: 3.3 mmol/L — ABNORMAL LOW (ref 3.5–5.1)
Sodium: 137 mmol/L (ref 135–145)
Total Bilirubin: 0.5 mg/dL (ref 0.3–1.2)
Total Protein: 6.6 g/dL (ref 6.5–8.1)

## 2020-06-27 LAB — RESPIRATORY PANEL BY RT PCR (FLU A&B, COVID)
Influenza A by PCR: NEGATIVE
Influenza B by PCR: NEGATIVE
SARS Coronavirus 2 by RT PCR: NEGATIVE

## 2020-06-27 LAB — SALICYLATE LEVEL: Salicylate Lvl: <7 mg/dL — ABNORMAL LOW (ref 7.0–30.0)

## 2020-06-27 LAB — TROPONIN I (HIGH SENSITIVITY): Troponin I (High Sensitivity): 6 ng/L (ref <18)

## 2020-06-27 LAB — ACETAMINOPHEN LEVEL: Acetaminophen (Tylenol), Serum: 12 ug/mL (ref 10–30)

## 2020-06-27 MED ORDER — POTASSIUM CHLORIDE CRYS ER 20 MEQ PO TBCR
40.0000 meq | EXTENDED_RELEASE_TABLET | Freq: Once | ORAL | Status: AC
  Administered 2020-06-28: 40 meq via ORAL
  Filled 2020-06-27: qty 2

## 2020-06-27 MED ORDER — LACTATED RINGERS IV BOLUS
1000.0000 mL | Freq: Once | INTRAVENOUS | Status: AC
  Administered 2020-06-27: 1000 mL via INTRAVENOUS

## 2020-06-27 MED ORDER — ACETAMINOPHEN 325 MG PO TABS
650.0000 mg | ORAL_TABLET | Freq: Once | ORAL | Status: AC
  Administered 2020-06-27: 650 mg via ORAL
  Filled 2020-06-27: qty 2

## 2020-06-27 NOTE — ED Triage Notes (Signed)
Pt from [facility] via [facility]. Pt took an unknown pill at 3pm (possibly anxiety/pain med). Bystander reported pt "took a handful of pills".  Pt denies HI/SI, pt reports "just feeling sad, and being tired of being in the hospital". Pt had L knee surgery last week. Pt found to be hypotensive with HR 72-92, CBG 187.   PT current BP 98/68.

## 2020-06-27 NOTE — ED Notes (Signed)
Critical Lactic acid: 2.0. Verbally informed EDP.

## 2020-06-27 NOTE — ED Provider Notes (Signed)
[facility] Emergency Department Provider Note  ____________________________________________   First MD Initiated Contact with Patient 06/27/20 2200     (approximate)  I have reviewed the triage vital signs and the nursing notes.   HISTORY  Chief Complaint Hypotension   HPI Bethany Villa is a 64 y.o. female with a past medical history of anxiety, asthma, chronic low back pain, CHF, COPD, depression, DJD, fibromyalgia, GERD, HDL, and recent I&D of her left knee on the 13th of this month who presents via EMS from SNF after patient reportedly took a Zoloft brought her by family number at the staff at there was not approved by the SNF staff and SNF staff noted patient to be hypertensive several hours after it happened.  Patient states she took 1 tablet of Zoloft because she was feeling sad and anxious.  She denies any SI HI or any attempt to harm her self.  She denies any acute symptoms although endorses some chronic pain in her left knee and lower back.  Specifically denies any fevers, chills, headache, earache, sore throat, vertigo, vision changes, chest pain, cough, shortness of breath, nausea, vomiting, diarrhea, dysuria, rash, recent falls or injuries.  Denies any other acute extremity or musculoskeletal pain.  Denies EtOH use, illicit drug use, or personal episodes.  She does note she has not been eating or drinking very much today.  She states she only would like a glass of water and wants to go back to her SNF.         Past Medical History:  Diagnosis Date  . Anxiety   . Asthma   . Chronic pain    lower back  . Chronic systolic CHF (congestive heart failure) ([place]) 09/22/2019  . COPD (chronic obstructive pulmonary disease) ([place])   . Depression   . DJD (degenerative joint disease)   . Fibromyalgia   . GERD (gastroesophageal reflux disease)   . Headache   . Hyperlipidemia   . Lower extremity edema   . On home oxygen therapy    has not been on since  2016  . Panic attack   . Shortness of breath dyspnea   . Sleep disorder     Patient Active Problem List   Diagnosis Date Noted  . Acute hypercapnic respiratory failure ([place]) 10/07/2019  . Altered mental status   . Chronic systolic CHF (congestive heart failure) ([place]) 09/22/2019  . Degeneration of cervical intervertebral disc 07/05/2019  . Bilateral primary osteoarthritis of knee 04/26/2019  . Chronic bilateral thoracic back pain 12/05/2018  . Cervicalgia 12/05/2018  . 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 Acute adjustment disorder with anxiety 10/06/2017  . Palliative care by specialist   . Respiratory failure ([place]) 10/01/2017  . COPD exacerbation ([place]) 09/01/2016  . Elevated brain natriuretic peptide (BNP) level 09/01/2016  . LFT elevation 09/01/2016  . 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 Chronic obstructive pulmonary disease with acute lower respiratory infection ([place])   . Acute respiratory failure with hypoxia and hypercapnia ([facility])   . Sepsis ([place]) 09/03/2015  . CAP (community acquired pneumonia) 09/01/2015  . SIRS (systemic inflammatory response syndrome) ([place]) 07/24/2014  . COPD with acute exacerbation ([place]) 09/29/2013  . Acute on chronic respiratory failure with hypercapnia ([place]) 11/27/2012  . Insomnia 11/04/2012  . Generalized anxiety disorder 11/04/2012  . Depression 11/04/2012  . Fibromyalgia 11/04/2012  . Metabolic encephalopathy 89/21/1940  . COPD (chronic obstructive pulmonary disease) ([place]) 06/01/2011  . Smoking  05/19/2011  . Obesity 05/19/2011  . Hyperglycemia 05/19/2011    Past Surgical History:  Procedure Laterality Date  . ABDOMINAL SURGERY    . ANKLE SURGERY    . BACK SURGERY     lower  . CARPAL TUNNEL RELEASE    . CESAREAN SECTION    . I & D KNEE WITH POLY EXCHANGE N/A 06/11/2020   Procedure: IRRIGATION AND DEBRIDEMENT KNEE WITH POLY EXCHANGE;  Surgeon: Corky Mull, MD;  Location: [facility] ORS;  Service:  Orthopedics;  Laterality: N/A;  . KNEE SURGERY    . TONSILLECTOMY    . TOTAL KNEE ARTHROPLASTY Left 04/14/2016   Procedure: TOTAL KNEE ARTHROPLASTY;  Surgeon: Earnestine Leys, MD;  Location: [facility] ORS;  Service: Orthopedics;  Laterality: Left;  . TUBAL LIGATION      Prior to Admission medications   Medication Sig Start Date End Date Taking? Authorizing Provider  acetaminophen (TYLENOL) 500 MG tablet Take 2 tablets (1,000 mg total) by mouth every 12 (twelve) hours as needed for mild pain or moderate pain. 10/01/19 09/30/20  Gillis Santa, MD  albuterol (VENTOLIN HFA) 108 (90 Base) MCG/ACT inhaler INHALE 2 PUFFS BY MOUTH EVERY 6 HOURS AS NEEDED FOR WHEEZE 10/20/18   [provider]  atorvastatin (LIPITOR) 20 MG tablet TAKE 1 TABLET (20 MG TOTAL) BY MOUTH DAILY. 04/29/16   Hassell Done, Mary-Margaret, FNP  buprenorphine (BUTRANS) 20 MCG/HR PTWK Place 1 patch onto the skin every 7 (seven) days. 04/22/20 07/15/20  Gillis Santa, MD  busPIRone (BUSPAR) 5 MG tablet Take 5 mg by mouth 3 (three) times daily.     [provider]  cefTRIAXone (ROCEPHIN) IVPB Inject 2 g into the vein daily. Indication: S. Pneumoniae bacteremia and knee PJI Last Day of Therapy: 07/23/2020 Labs - Once weekly:  CBC/D,  CMP, CRP Labs - Every other week:  ESR and CRP 06/17/20 07/26/20  Enzo Bi, MD  enoxaparin (LOVENOX) 40 MG/0.4ML injection Inject 0.4 mLs (40 mg total) into the skin daily for 14 doses. 06/13/20 06/27/20  Reche Dixon, PA-C  feeding supplement (ENSURE ENLIVE / ENSURE PLUS) LIQD Take 237 mLs by mouth 2 (two) times daily between meals. 06/17/20   Enzo Bi, MD  Fluticasone-Umeclidin-Vilant (TRELEGY ELLIPTA) 100-62.5-25 MCG/INH AEPB Inhale 1 puff into the lungs daily. 07/11/19   [provider]  furosemide (LASIX) 40 MG tablet Take 40 mg by mouth daily as needed.  07/11/19   [provider]  ipratropium-albuterol (DUONEB) 0.5-2.5 (3) MG/3ML SOLN Take 3 mLs by nebulization every 6 (six) hours as  needed. 06/17/20   Enzo Bi, MD  Multiple Vitamin (MULTIVITAMIN WITH MINERALS) TABS tablet Take 1 tablet by mouth daily. 06/17/20   Enzo Bi, MD  ondansetron (ZOFRAN) 4 MG tablet Take 4 mg by mouth as needed for nausea/vomiting. 09/04/19   [provider]  oxyCODONE (OXY IR/ROXICODONE) 5 MG immediate release tablet Take 1 tablet (5 mg total) by mouth every 6 (six) hours as needed for moderate pain (pain score 4-6). 06/17/20   Enzo Bi, MD  polyethylene glycol (MIRALAX / GLYCOLAX) 17 g packet Take 34 g by mouth 2 (two) times daily. 06/17/20   Enzo Bi, MD  sertraline (ZOLOFT) 50 MG tablet Take 2 tablets (100 mg total) by mouth daily. 06/17/20   Enzo Bi, MD  tiZANidine (ZANAFLEX) 4 MG tablet Take 1.5 tablets (6 mg total) by mouth 2 (two) times daily as needed for muscle spasms. 04/22/20 07/21/20  Gillis Santa, MD  topiramate (TOPAMAX) 25 MG tablet Take 25  mg by mouth 2 (two) times daily. 08/02/19   [provider]  traZODone (DESYREL) 50 MG tablet Take 1 tablet (50 mg total) by mouth at bedtime as needed for sleep. 06/17/20   Enzo Bi, MD    Allergies Gabapentin, Mobic [meloxicam], Penicillins, and Sulfa antibiotics  Family History  Problem Relation Age of Onset  . Cancer Mother        beast    Social History Social History   Tobacco Use  . Smoking status: Current Every Day Smoker    Packs/day: 1.00    Years: 45.00    Pack years: 45.00    Types: Cigarettes  . Smokeless tobacco: Never Used  Vaping Use  . Vaping Use: Never used  Substance Use Topics  . Alcohol use: Yes    Alcohol/week: 0.0 standard drinks    Comment: rare  . Drug use: No    Review of Systems  Review of Systems  Constitutional: Negative for chills and fever.  HENT: Negative for sore throat.   Eyes: Negative for pain.  Respiratory: Negative for cough and stridor.   Cardiovascular: Negative for chest pain.  Gastrointestinal: Negative for vomiting.  Genitourinary: Negative for dysuria.   Musculoskeletal: Positive for myalgias ( L knee).  Skin: Negative for rash.  Neurological: Positive for weakness ( L knee, chronic). Negative for seizures, loss of consciousness and headaches.  Psychiatric/Behavioral: Negative for hallucinations, memory loss, substance abuse and suicidal ideas. The patient is nervous/anxious. The patient does not have insomnia.   All other systems reviewed and are negative.     ____________________________________________   PHYSICAL EXAM:  VITAL SIGNS: ED Triage Vitals  Enc Vitals Group     BP      Pulse      Resp      Temp      Temp src      SpO2      Weight      Height      Head Circumference      [place]      Pain Score      Pain Loc      Pain Edu?      Excl. in [place]?    Vitals:   06/27/20 2345 06/28/20 0040  BP: 101/72 114/73  Pulse: 84 81  Resp:  18  Temp:  98 F (36.7 C)  SpO2: 99% 99%   Physical Exam Vitals and nursing note reviewed.  Constitutional:      General: She is not in acute distress.    Appearance: She is well-developed.  HENT:     Head: Normocephalic and atraumatic.     Right Ear: External ear normal.     Left Ear: External ear normal.     Nose: Nose normal.     Mouth/Throat:     Mouth: Mucous membranes are dry.  Eyes:     Conjunctiva/sclera: Conjunctivae normal.  Cardiovascular:     Rate and Rhythm: Normal rate and regular rhythm.     Heart sounds: No murmur heard.   Pulmonary:     Effort: Pulmonary effort is normal. No respiratory distress.     Breath sounds: Normal breath sounds.  Abdominal:     Palpations: Abdomen is soft.     Tenderness: There is no abdominal tenderness.  Musculoskeletal:     Cervical back: Neck supple.  Skin:    General: Skin is warm and dry.     Capillary Refill: Capillary refill takes 2 to 3 seconds.  Neurological:     Mental Status: She is alert and oriented to person, place, and time.  Psychiatric:        Mood and Affect: Mood normal.   PERRLA.  EOMI.  2+  bilateral radial and DP pulses.  Dressing in place over the left knee without surrounding streaking erythema drainage or bleeding.  There is no nystagmus.  No clonus.  ____________________________________________   LABS (all labs ordered are listed, but only abnormal results are displayed)  Labs Reviewed  COMPREHENSIVE METABOLIC PANEL - Abnormal; Notable for the following components:      Result Value   Potassium 3.3 (*)    Glucose, Bld 142 (*)    Calcium 8.1 (*)    Albumin 2.1 (*)    AST 43 (*)    ALT 45 (*)    All other components within normal limits  CBC WITH DIFFERENTIAL/PLATELET - Abnormal; Notable for the following components:   RBC 3.44 (*)    Hemoglobin 9.9 (*)    HCT 31.7 (*)    Platelets 55 (*)    All other components within normal limits  LACTIC ACID, PLASMA - Abnormal; Notable for the following components:   Lactic Acid, Venous 2.0 (*)    All other components within normal limits  SALICYLATE LEVEL - Abnormal; Notable for the following components:   Salicylate Lvl <2.7 (*)    All other components within normal limits  RESPIRATORY PANEL BY RT PCR (FLU A&B, COVID)  LACTIC ACID, PLASMA  ACETAMINOPHEN LEVEL  URINALYSIS, COMPLETE (UACMP) WITH MICROSCOPIC  TROPONIN I (HIGH SENSITIVITY)  TROPONIN I (HIGH SENSITIVITY)   ____________________________________________  EKG  Sinus rhythm with a ventricular rate of 76, normal intervals, no runs of acute ischemia or other underlying arrhythmia ____________________________________________  RADIOLOGY  ED MD interpretation: Patient has some consolidation in the right upper lobe which is present on prior imaging.  Right upper extremity PICC is in place.  No new masses effusions or evidence of pneumonia.  Official radiology report(s): DG Chest 1 View  Result Date: 06/27/2020 CLINICAL DATA:  Hypotension EXAM: CHEST  1 VIEW COMPARISON:  06/10/2020 FINDINGS: There is progressive collapse and consolidation involving the  right upper lobe. Tiny right pleural effusion unchanged. No pneumothorax. No new focal pulmonary infiltrate. Right upper extremity PICC line has been placed with its tip within the SVC. Cardiac size within normal limits. Pulmonary vascularity is normal. Enlargement of the right hilum likely relates to right hilar adenopathy given its interval development. IMPRESSION: Progressive collapse and consolidation within the right upper lobe. Follow-up chest radiograph is recommended in 3-4 weeks to document complete resolution exclude the presence of an underlying central obstructing mass. Electronically Signed   By: Fidela Salisbury MD   On: 06/27/2020 22:44    ____________________________________________   PROCEDURES  Procedure(s) performed (including Critical Care):  Procedures   ____________________________________________   INITIAL IMPRESSION / ASSESSMENT AND PLAN / ED COURSE        Patient presents with [place] to history exam after she took a Zoloft tablet it was not approved by her SNF and she was noted to become hypotensive over the subsequent hours.  Patient denies any SI and states she just took this because she was nervous.  On arrival patient is hypotensive with a BP of 85/67 with otherwise stable vital signs on room air.  She is noted to have decreased capillary refill and dry mucous membranes.  Otherwise she does not appear to exhibit any clear toxidrome, focal neurological deficits  and does not appear intoxicated suicidal homicidal or psychotic.  Overall very low suspicion the patient's lower blood pressure is related to her Zoloft ingestion earlier today.  In addition she denies any urinary symptoms and has no evidence of pneumonia on her chest x-ray have a low suspicion for sepsis.  Her CBC count is not elevated and she has no fever or other infectious symptoms or findings on exam to suggest an infectious process.  Troponin is unremarkable and given no chest pain and reassuring EKG low  suspicion for ACS. Will initial lactic acid slightly elevated 2 this is consistent with some dehydration.  In addition after some IV fluids this was noted to downtrend to 1.2.  Patient's blood pressure improved to 114/73 and given otherwise reassuring exam work-up and patient continuing to deny any symptoms on multiple reassessments at which she is safe for discharge back to her skilled nursing facility.  Discharge stable condition. ____________________________________________   FINAL CLINICAL IMPRESSION(S) / ED DIAGNOSES  Final diagnoses:  Hypotension, unspecified hypotension type    Medications  lactated ringers bolus 1,000 mL (0 mLs Intravenous Stopped 06/28/20 0043)  acetaminophen (TYLENOL) tablet 650 mg (650 mg Oral Given 06/27/20 2359)  potassium chloride SA (KLOR-CON) CR tablet 40 mEq (40 mEq Oral Given 06/28/20 0000)     ED Discharge Orders    None       Note:  This document was prepared using Dragon voice recognition software and may include unintentional dictation errors.   Lucrezia Starch, MD 06/28/20 [phone]

## 2020-06-28 NOTE — ED Notes (Signed)
Pt gave this RN verbal consent for DC.  

## 2020-07-08 ENCOUNTER — Other Ambulatory Visit: Payer: Self-pay

## 2020-07-08 ENCOUNTER — Encounter: Payer: Self-pay | Admitting: Student in an Organized Health Care Education/Training Program

## 2020-07-08 ENCOUNTER — Telehealth: Payer: Self-pay | Admitting: Student in an Organized Health Care Education/Training Program

## 2020-07-08 ENCOUNTER — Ambulatory Visit
Payer: Medicare Other | Attending: Student in an Organized Health Care Education/Training Program | Admitting: Student in an Organized Health Care Education/Training Program

## 2020-07-08 ENCOUNTER — Telehealth: Payer: Self-pay | Admitting: Infectious Diseases

## 2020-07-08 VITALS — BP 82/56 | HR 82 | Temp 97.6°F | Resp 16 | Ht 63.0 in | Wt 170.0 lb

## 2020-07-08 DIAGNOSIS — M797 Fibromyalgia: Secondary | ICD-10-CM | POA: Insufficient documentation

## 2020-07-08 DIAGNOSIS — M542 Cervicalgia: Secondary | ICD-10-CM | POA: Insufficient documentation

## 2020-07-08 DIAGNOSIS — G8929 Other chronic pain: Secondary | ICD-10-CM | POA: Diagnosis present

## 2020-07-08 DIAGNOSIS — M5030 Other cervical disc degeneration, unspecified cervical region: Secondary | ICD-10-CM | POA: Diagnosis present

## 2020-07-08 DIAGNOSIS — M25551 Pain in right hip: Secondary | ICD-10-CM | POA: Diagnosis present

## 2020-07-08 DIAGNOSIS — F17200 Nicotine dependence, unspecified, uncomplicated: Secondary | ICD-10-CM

## 2020-07-08 DIAGNOSIS — M25562 Pain in left knee: Secondary | ICD-10-CM | POA: Insufficient documentation

## 2020-07-08 DIAGNOSIS — G894 Chronic pain syndrome: Secondary | ICD-10-CM | POA: Insufficient documentation

## 2020-07-08 DIAGNOSIS — M170 Bilateral primary osteoarthritis of knee: Secondary | ICD-10-CM

## 2020-07-08 DIAGNOSIS — Z96652 Presence of left artificial knee joint: Secondary | ICD-10-CM | POA: Insufficient documentation

## 2020-07-08 MED ORDER — BUPRENORPHINE 20 MCG/HR TD PTWK: 1.0000 | MEDICATED_PATCH | TRANSDERMAL | 2 refills | Status: DC

## 2020-07-08 NOTE — Telephone Encounter (Signed)
Tell her to call us when she gets out of the hospital and we can see her then. Where is she hospitalized?

## 2020-07-08 NOTE — Addendum Note (Signed)
Addended by: Edward Jolly on: 07/08/2020 01:12 PM   Modules accepted: Orders

## 2020-07-08 NOTE — Progress Notes (Signed)
PROVIDER NOTE: Information contained herein reflects review and annotations entered in association with encounter. Interpretation of such information and data should be left to medically-trained personnel. Information provided to patient can be located elsewhere in the medical record under "Patient Instructions". Document created using STT-dictation technology, any transcriptional errors that may result from process are unintentional.    Patient: Bethany Villa  Service Category: E/M  Provider: Gillis Santa, MD  DOB: 1956-04-11  DOS: 07/08/2020  Specialty: Interventional Pain Management  MRN: [id number]  Setting: Ambulatory outpatient  PCP: Marinda Elk, MD  Type: Established Patient    Referring Provider: Marinda Elk, MD  Location: Office  Delivery: Face-to-face     HPI  [name], a 64 y.o. year old female, is here today because of her Chronic pain disorder [G89.4]. Bethany Villa primary complain today is Leg Pain (left) and Back Pain (low) Last encounter: My last encounter with her was on 04/22/2020. Pertinent problems: Bethany Villa has Generalized anxiety disorder; Fibromyalgia; Chronic bilateral thoracic back pain; Cervicalgia; Chronic knee pain after total replacement of left knee joint; and Bilateral primary osteoarthritis of knee on their pertinent problem list. Pain Assessment: Severity of Chronic pain is reported as a 9 /10. Location:  (lower back) Left/ . Onset:  . Quality:  (lower back, throbbing). Timing:  . Modifying factor(s): nothing. Vitals:  height is '5\' 3"'  (1.6 m) and weight is 170 lb (77.1 kg). Her temperature is 97.6 F (36.4 C). Her blood pressure is 82/56 (abnormal) and her pulse is 82. Her respiration is 16 and oxygen saturation is 97%.   Reason for encounter: medication management.   Patient is status post irrigation and debridement of an acutely septic left total knee arthroplasty.  See note below from orthopedic surgery on 06/30/2020. "Surgery was  performed by Dr. Roland Rack on 06/11/2020. Cultures from the surgery demonstrated rare strep pneumonia, she was discharged with a PICC line and has been receiving weekly IV doses of Rocephin. She is scheduled to follow-up with infectious disease in the next 10-14 days. The patient has been working with formal physical therapy, she does still report a 10 out of 10 pain score in the left knee however the patient does have a history of chronic pain, she is taking oxycodone at this time. She is also taking Zanaflex, she has a few more days left of Lovenox injections at this time. She denies any falls or trauma affecting the left knee. She denies any signs infection such as fevers or any purulent drainage from the left knee incision site. She has been walking and does feel that this is going well, she presents today with her son. "  She had staples removed from surgical site on 06/30/2020.  She continues on IV Rocephin weekly.  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  She presents today for medication refill of buprenorphine.  Patient is currently on a 20 mcg an hour patch.  She has been on this for many months.  No side effects noted.  I informed the patient that buprenorphine is for the management of her chronic pain syndrome.  For her acute left knee pain, any short acting opioid analgesics will need to come from surgical team as this is related to left knee infection and postsurgical pain from I&D.   Pharmacotherapy Assessment   07/03/2020  04/22/2020   2  Buprenorphine 20 Mcg/hr Patch 4.00  28  Bi  Lat  [id number]  Gib (4800)  2/2  0.48 mg  Medicare  [place]      Analgesic:  Butrans patch @ 97mg/hr   Monitoring: [place] PMP: PDMP reviewed during this encounter.       Pharmacotherapy: No side-effects or adverse reactions reported. Compliance: No problems identified. Effectiveness: Clinically acceptable.    UDS:  Summary  Date  Value Ref Range Status  05/16/2018 FINAL  Final    Comment:    ==================================================================== TOXASSURE COMP DRUG ANALYSIS,UR ==================================================================== Test                             Result       Flag       Units Drug Present and Declared for Prescription Verification   Sertraline                     PRESENT      EXPECTED   Desmethylsertraline            PRESENT      EXPECTED    Desmethylsertraline is an expected metabolite of sertraline.   Trazodone                      PRESENT      EXPECTED   1,3 chlorophenyl piperazine    PRESENT      EXPECTED    1,3-chlorophenyl piperazine is an expected metabolite of    trazodone. Drug Present not Declared for Prescription Verification   Buprenorphine                  11           UNEXPECTED ng/mg creat   Norbuprenorphine               45           UNEXPECTED ng/mg creat    Source of buprenorphine is a scheduled prescription medication.    Norbuprenorphine is an expected metabolite of buprenorphine.   Methocarbamol                  PRESENT      UNEXPECTED Drug Absent but Declared for Prescription Verification   Tizanidine                     Not Detected UNEXPECTED    Tizanidine, as indicated in the declared medication list, is not    always detected even when used as directed. ==================================================================== Test                      Result    Flag   Units      Ref Range   Creatinine              126              mg/dL      >=20 ==================================================================== Declared Medications:  The flagging and interpretation on this report are based on the  following declared medications.  Unexpected results may arise from  inaccuracies in the declared medications.  **Note: The testing scope of this panel includes these medications:  Sertraline (Zoloft)  Trazodone  **Note: The testing scope of this  panel does not include small to  moderate amounts of these reported medications:  Tizanidine  **Note: The testing scope of this panel does not include following  reported medications:  Albuterol (Proventil)  Atorvastatin (  Lipitor)  Buspirone (BuSpar) ==================================================================== For clinical consultation, please call [phone]. ====================================================================      ROS  Constitutional: Denies any fever or chills Gastrointestinal: No reported hemesis, hematochezia, vomiting, or acute GI distress Musculoskeletal: Low back pain, left knee pain Neurological: No reported episodes of acute onset apraxia, aphasia, dysarthria, agnosia, amnesia, paralysis, loss of coordination, or loss of consciousness  Medication Review  Fluticasone-Umeclidin-Vilant, acetaminophen, albuterol, atorvastatin, buprenorphine, busPIRone, cefTRIAXone, enoxaparin, feeding supplement, furosemide, ipratropium-albuterol, multivitamin with minerals, ondansetron, oxyCODONE, polyethylene glycol, sertraline, tiZANidine, topiramate, and traZODone  History Review  Allergy: Bethany Villa is allergic to gabapentin, mobic [meloxicam], penicillins, and sulfa antibiotics. Drug: Bethany Villa  reports no history of drug use. Alcohol:  reports current alcohol use. Tobacco:  reports that she has been smoking cigarettes. She has a 45.00 pack-year smoking history. She has never used smokeless tobacco. Social: Bethany Villa  reports that she has been smoking cigarettes. She has a 45.00 pack-year smoking history. She has never used smokeless tobacco. She reports current alcohol use. She reports that she does not use drugs. Medical:  has a past medical history of Anxiety, Asthma, Chronic pain, Chronic systolic CHF (congestive heart failure) ([place]) (09/22/2019), COPD (chronic obstructive pulmonary disease) ([place]), Depression, DJD (degenerative joint disease), Fibromyalgia,  GERD (gastroesophageal reflux disease), Headache, Hyperlipidemia, Lower extremity edema, On home oxygen therapy, Panic attack, Shortness of breath dyspnea, and Sleep disorder. Surgical: Bethany Villa  has a past surgical history that includes Abdominal surgery; Carpal tunnel release; Ankle surgery; Knee surgery; Cesarean section; Tonsillectomy; Tubal ligation; Back surgery; Total knee arthroplasty (Left, 04/14/2016); and I & D knee with poly exchange (N/A, 06/11/2020). Family: family history includes Cancer in her mother.  Laboratory Chemistry Profile   Renal Lab Results  Component Value Date   BUN 15 06/27/2020   CREATININE 0.80 06/27/2020   BCR 16 08/01/2015   GFRAA >60 10/11/2019   GFRNONAA >60 06/27/2020     Hepatic Lab Results  Component Value Date   AST 43 (H) 06/27/2020   ALT 45 (H) 06/27/2020   ALBUMIN 2.1 (L) 06/27/2020   ALKPHOS 121 06/27/2020   AMYLASE 39 10/01/2017   LIPASE 18 10/01/2017     Electrolytes Lab Results  Component Value Date   NA 137 06/27/2020   K 3.3 (L) 06/27/2020   CL 105 06/27/2020   CALCIUM 8.1 (L) 06/27/2020   MG 1.8 06/17/2020   PHOS 5.1 (H) 09/04/2015     Bone No results found for: VD25OH, WO032ZY2QMG, NO0370WU8, QB1694HW3, 25OHVITD1, 25OHVITD2, 25OHVITD3, TESTOFREE, TESTOSTERONE   Inflammation (CRP: Acute Phase) (ESR: Chronic Phase) Lab Results  Component Value Date   CRP 4.2 (H) 06/17/2020   ESRSEDRATE 69 (H) 06/17/2020   LATICACIDVEN 1.2 06/27/2020       Note: Above Lab results reviewed.  Recent Imaging Review  DG Chest 1 View CLINICAL DATA:  Hypotension  EXAM: CHEST  1 VIEW  COMPARISON:  06/10/2020  FINDINGS: There is progressive collapse and consolidation involving the right upper lobe. Tiny right pleural effusion unchanged. No pneumothorax. No new focal pulmonary infiltrate. Right upper extremity PICC line has been placed with its tip within the SVC. Cardiac size within normal limits. Pulmonary vascularity is normal.  Enlargement of the right hilum likely relates to right hilar adenopathy given its interval development.  IMPRESSION: Progressive collapse and consolidation within the right upper lobe. Follow-up chest radiograph is recommended in 3-4 weeks to document complete resolution exclude the presence of an underlying central obstructing mass.  Electronically Signed   By:  Fidela Salisbury MD   On: 06/27/2020 22:44 Note: Reviewed        Physical Exam  General appearance: Well nourished, well developed, and well hydrated. In no apparent acute distress Mental status: Alert, oriented x 3 (person, place, & time)       Respiratory: No evidence of acute respiratory distress Eyes: PERLA Vitals: BP (!) 82/56   Pulse 82   Temp 97.6 F (36.4 C)   Resp 16   Ht '5\' 3"'  (1.6 m)   Wt 170 lb (77.1 kg)   SpO2 97%   BMI 30.11 kg/m  BMI: Estimated body mass index is 30.11 kg/m as calculated from the following:   Height as of this encounter: '5\' 3"'  (1.6 m).   Weight as of this encounter: 170 lb (77.1 kg). Ideal: Ideal body weight: 52.4 kg (115 lb 8.3 oz) Adjusted ideal body weight: 62.3 kg (137 lb 5 oz)  Lumbar Spine Area Exam  Skin & Axial Inspection: No masses, redness, or swelling Alignment: Symmetrical Functional ROM: Pain restricted ROM       Stability: No instability detected Muscle Tone/Strength: Functionally intact. No obvious neuro-muscular anomalies detected. Sensory (Neurological): Musculoskeletal pain pattern  Provocative Tests: Hyperextension/rotation test: (+) bilaterally for facet joint pain. Lumbar quadrant test (Kemp's test): (+) bilaterally for facet joint pain. Lateral bending test: deferred today       Patrick's Maneuver: deferred today                   FABER* test: (+) for bilateral S-I arthralgia             S-I anterior distraction/compression test: deferred today         S-I lateral compression test: deferred today         S-I Thigh-thrust test: deferred today         S-I  Gaenslen's test: deferred today         *(Flexion, ABduction and External Rotation) Gait & Posture Assessment  Ambulation: Limited Gait: Relatively normal for age and body habitus Posture: Difficulty standing up straight, due to pain  Lower Extremity Exam    Side: Right lower extremity  Side: Left lower extremity  Stability: No instability observed          Stability: No instability observed          Skin & Extremity Inspection: Skin color, temperature, and hair growth are WNL. No peripheral edema or cyanosis. No masses, redness, swelling, asymmetry, or associated skin lesions. No contractures.  Skin & Extremity Inspection: Evidence of prior arthroplastic surgery, erythema noted.  Tender to touch.  Staples removed.  Functional ROM: Pain restricted ROM for hip joint          Functional ROM: Pain restricted ROM for hip and knee joints          Muscle Tone/Strength: Functionally intact. No obvious neuro-muscular anomalies detected.  Muscle Tone/Strength: Functionally intact. No obvious neuro-muscular anomalies detected.  Sensory (Neurological): Musculoskeletal pain pattern        Sensory (Neurological): Musculoskeletal pain pattern        DTR: Patellar: deferred today Achilles: deferred today Plantar: deferred today  DTR: Patellar: deferred today Achilles: deferred today Plantar: deferred today  Palpation: No palpable anomalies  Palpation: No palpable anomalies     Assessment   Status Diagnosis  Controlled Controlled Controlled 1. Chronic pain disorder   2. Fibromyalgia   3. Smoking   4. Cervicalgia   5. Degeneration of cervical intervertebral disc  6. Bilateral primary osteoarthritis of knee   7. Chronic knee pain after total replacement of left knee joint   8. Right hip pain   9. Chronic pain syndrome      Plan of Care   Bethany Villa has a current medication list which includes the following long-term medication(s): albuterol, atorvastatin,  ipratropium-albuterol, sertraline, trazodone, and enoxaparin.  Pharmacotherapy (Medications Ordered): Meds ordered this encounter  Medications  . buprenorphine (BUTRANS) 20 MCG/HR PTWK    Sig: Place 1 patch onto the skin every 7 (seven) days.    Dispense:  4 patch    Refill:  2    For chronic pain syndrome   Orders:  Orders Placed This Encounter  Procedures  . ToxASSURE Select 13 (MW), Urine    Volume: 30 ml(s). Minimum 3 ml of urine is needed. Document temperature of fresh sample. Indications: Long term (current) use of opiate analgesic [phone])    Order Specific Question:   Release to patient    Answer:   Immediate   Follow-up plan:   Return in about 3 months (around 10/16/2020) for Medication Management, in person.   Recent Visits Date Type Provider Dept  04/22/20 Office Visit Gillis Santa, MD [facility]  Showing recent visits within past 90 days and meeting all other requirements Today's Visits Date Type Provider Dept  07/08/20 Office Visit Gillis Santa, MD [facility]  Showing today's visits and meeting all other requirements Future Appointments No visits were found meeting these conditions. Showing future appointments within next 90 days and meeting all other requirements  I discussed the assessment and treatment plan with the patient. The patient was provided an opportunity to ask questions and all were answered. The patient agreed with the plan and demonstrated an understanding of the instructions.  Patient advised to call back or seek an in-person evaluation if the symptoms or condition worsens.  Duration of encounter:30 minutes.  Note by: Gillis Santa, MD Date: 07/08/2020; Time: 10:14 AM

## 2020-07-08 NOTE — Telephone Encounter (Signed)
Bethany Villa at [facility] [phone]  this patient is with [facility] and they need to know about medications and where they are being sent to. Please call asap.

## 2020-07-08 NOTE — Progress Notes (Signed)
Nursing Pain Medication Assessment:  Safety precautions to be maintained throughout the outpatient stay will include: orient to surroundings, keep bed in low position, maintain call bell within reach at all times, provide assistance with transfer out of bed and ambulation.  Medication Inspection Compliance: Bethany Villa did not comply with our request to bring her pills to be counted. She was reminded that bringing the medication bottles, even when empty, is a requirement.  Medication: None brought in. Pill/Patch Count: None available to be counted. Bottle Appearance: No container available. Did not bring bottle(s) to appointment. Filled Date: N/A Last Medication intake:  unknown, patient is in assisted living

## 2020-07-08 NOTE — Telephone Encounter (Signed)
Francena Hanly, RN [phone] called concerning patients medication scripts. Please return her call

## 2020-07-08 NOTE — Telephone Encounter (Signed)
Spoke with Belinda at [facility], she is requesting that patients scripts be sent to [facility] in [place], [place], because that is the pharmacy they use. I called [facility] and cancelled scripts for Buprenorphine sent today.

## 2020-07-08 NOTE — Telephone Encounter (Signed)
Called the patient to have her come in at 53 instead of 11 on 07/10/20, she refused appt and stated she was in the hospital and doubt that they would discharge her by Thursday. Please advise on what you would like to do

## 2020-07-09 ENCOUNTER — Other Ambulatory Visit: Payer: Self-pay | Admitting: Student in an Organized Health Care Education/Training Program

## 2020-07-09 DIAGNOSIS — M797 Fibromyalgia: Secondary | ICD-10-CM

## 2020-07-09 DIAGNOSIS — G894 Chronic pain syndrome: Secondary | ICD-10-CM

## 2020-07-09 MED ORDER — BUPRENORPHINE 20 MCG/HR TD PTWK: 1.0000 | MEDICATED_PATCH | TRANSDERMAL | 2 refills | Status: AC

## 2020-07-09 NOTE — Telephone Encounter (Signed)
Called [facility] and asked for Belinda. Was on hold for 5 min., hung up the phone.

## 2020-07-09 NOTE — Telephone Encounter (Signed)
[phone] Returned call. No answer and no way to leave voicemail.

## 2020-07-09 NOTE — Progress Notes (Unsigned)
Oxycodone is for acute pain.  I am only managing her chronic pain medication which is buprenorphine.  I would like for her to continue that.  I am not managing her oxycodone.

## 2020-07-10 ENCOUNTER — Inpatient Hospital Stay: Payer: Medicare Other | Admitting: Infectious Diseases

## 2020-07-10 NOTE — Telephone Encounter (Signed)
Attempted to call Belinda, unable to get past the answering message at [facility].

## 2020-07-14 NOTE — Telephone Encounter (Signed)
Attempted to call Bethany Villa. Receptionist stated Bethany Villa no longer works there. I asked for Ms. Lechtenberg's nurse. Placed on hold for 5 minutes, then hung up the phone.

## 2020-07-22 ENCOUNTER — Encounter: Payer: Medicare Other | Admitting: Student in an Organized Health Care Education/Training Program

## 2020-07-28 ENCOUNTER — Telehealth: Payer: Self-pay

## 2020-07-28 NOTE — Telephone Encounter (Signed)
I saw that the patient has a med refill in February, so I called her back and told her to check with her pharmacy to see if she has refills.

## 2020-07-28 NOTE — Telephone Encounter (Signed)
She has been in the hospital and needs pain medicine. She was in the hospital over a month.

## 2020-07-28 NOTE — Telephone Encounter (Signed)
I think she does not have any RF, because she was in a rehab facility, the meds were filled there. I think I remember calling her pharmacy and cancelling all her current prescriptions. If she calls back, she will need an appt.

## 2020-08-13 ENCOUNTER — Other Ambulatory Visit: Payer: Self-pay | Admitting: Student in an Organized Health Care Education/Training Program

## 2020-08-13 ENCOUNTER — Telehealth: Payer: Self-pay | Admitting: Student in an Organized Health Care Education/Training Program

## 2020-08-13 DIAGNOSIS — M797 Fibromyalgia: Secondary | ICD-10-CM

## 2020-08-13 MED ORDER — TIZANIDINE HCL 4 MG PO TABS: 4.0000 mg | ORAL_TABLET | Freq: Two times a day (BID) | ORAL | 5 refills | Status: AC | PRN

## 2020-08-13 NOTE — Telephone Encounter (Signed)
Patient did not get refills on Tizanidine when she was here 07-08-20. Can refills be sent in ? Her next appt is scheduled for February

## 2020-08-14 NOTE — Telephone Encounter (Signed)
Attempted to call patient.  LM that script was sent to pharmacy.

## 2020-10-16 ENCOUNTER — Encounter: Payer: Medicare Other | Admitting: Student in an Organized Health Care Education/Training Program

## 2020-11-26 ENCOUNTER — Other Ambulatory Visit: Payer: Self-pay | Admitting: Student in an Organized Health Care Education/Training Program

## 2020-11-26 DIAGNOSIS — M797 Fibromyalgia: Secondary | ICD-10-CM

## 2020-11-26 DIAGNOSIS — G894 Chronic pain syndrome: Secondary | ICD-10-CM

## 2021-01-15 ENCOUNTER — Telehealth: Payer: Self-pay | Admitting: Student in an Organized Health Care Education/Training Program

## 2021-01-15 NOTE — Telephone Encounter (Signed)
Patient states Tizanidine is not helping and wants to know if something stronger can be sent Please call patient

## 2021-01-15 NOTE — Telephone Encounter (Signed)
Please advise. Last appt 07-08-20. No future appts scheduled.

## 2021-06-01 ENCOUNTER — Telehealth: Payer: Self-pay

## 2021-06-01 NOTE — Telephone Encounter (Signed)
Is requesting a refill for her pain patch.

## 2021-06-01 NOTE — Telephone Encounter (Signed)
Patient needs an appointment. Called and she is going to make.

## 2021-06-02 ENCOUNTER — Encounter: Payer: Self-pay | Admitting: Student in an Organized Health Care Education/Training Program

## 2021-06-02 ENCOUNTER — Ambulatory Visit
Payer: Medicare Other | Attending: Student in an Organized Health Care Education/Training Program | Admitting: Student in an Organized Health Care Education/Training Program

## 2021-06-02 ENCOUNTER — Other Ambulatory Visit: Payer: Self-pay

## 2021-06-02 VITALS — BP 143/107 | HR 102 | Temp 97.5°F | Resp 18 | Ht 64.0 in | Wt 170.0 lb

## 2021-06-02 DIAGNOSIS — Z96652 Presence of left artificial knee joint: Secondary | ICD-10-CM | POA: Insufficient documentation

## 2021-06-02 DIAGNOSIS — M25562 Pain in left knee: Secondary | ICD-10-CM | POA: Diagnosis present

## 2021-06-02 DIAGNOSIS — M797 Fibromyalgia: Secondary | ICD-10-CM | POA: Diagnosis not present

## 2021-06-02 DIAGNOSIS — M5030 Other cervical disc degeneration, unspecified cervical region: Secondary | ICD-10-CM | POA: Diagnosis present

## 2021-06-02 DIAGNOSIS — F17200 Nicotine dependence, unspecified, uncomplicated: Secondary | ICD-10-CM | POA: Insufficient documentation

## 2021-06-02 DIAGNOSIS — M542 Cervicalgia: Secondary | ICD-10-CM | POA: Insufficient documentation

## 2021-06-02 DIAGNOSIS — G8929 Other chronic pain: Secondary | ICD-10-CM | POA: Insufficient documentation

## 2021-06-02 DIAGNOSIS — M25551 Pain in right hip: Secondary | ICD-10-CM | POA: Insufficient documentation

## 2021-06-02 DIAGNOSIS — M170 Bilateral primary osteoarthritis of knee: Secondary | ICD-10-CM | POA: Diagnosis not present

## 2021-06-02 DIAGNOSIS — G894 Chronic pain syndrome: Secondary | ICD-10-CM | POA: Diagnosis not present

## 2021-06-02 MED ORDER — ORPHENADRINE CITRATE 30 MG/ML IJ SOLN
INTRAMUSCULAR | Status: AC
  Filled 2021-06-02: qty 2

## 2021-06-02 MED ORDER — KETOROLAC TROMETHAMINE 30 MG/ML IJ SOLN
30.0000 mg | Freq: Once | INTRAMUSCULAR | Status: AC
  Administered 2021-06-02: 30 mg via INTRAMUSCULAR

## 2021-06-02 MED ORDER — ORPHENADRINE CITRATE 30 MG/ML IJ SOLN
30.0000 mg | Freq: Once | INTRAMUSCULAR | Status: AC
Start: 2021-06-02 — End: 2021-06-02
  Administered 2021-06-02: 30 mg via INTRAMUSCULAR

## 2021-06-02 MED ORDER — BUPRENORPHINE 7.5 MCG/HR TD PTWK: 1.0000 | MEDICATED_PATCH | TRANSDERMAL | 2 refills | Status: DC

## 2021-06-02 MED ORDER — KETOROLAC TROMETHAMINE 30 MG/ML IJ SOLN
INTRAMUSCULAR | Status: AC
  Filled 2021-06-02: qty 1

## 2021-06-02 NOTE — Progress Notes (Signed)
Safety precautions to be maintained throughout the outpatient stay will include: orient to surroundings, keep bed in low position, maintain call bell within reach at all times, provide assistance with transfer out of bed and ambulation.  

## 2021-06-02 NOTE — Progress Notes (Signed)
/PROVIDER NOTE: Information contained herein reflects review and annotations entered in association with encounter. Interpretation of such information and data should be left to medically-trained personnel. Information provided to patient can be located elsewhere in the medical record under "Patient Instructions". Document created using STT-dictation technology, any transcriptional errors that may result from process are unintentional.    Patient: Bethany Villa  Service Category: E/M  Provider: Gillis Santa, MD  DOB: [birth year]  DOS: 06/02/2021  Specialty: Interventional Pain Management  MRN: [id number]  Setting: Ambulatory outpatient  PCP: Bethany Elk, MD  Type: Established Patient    Referring Provider: Marinda Elk, MD  Location: Office  Delivery: Face-to-face     HPI  [name], a 65 y.o. year old female, is here today because of her Fibromyalgia [M79.7]. [name] primary complain today is Back Pain (lower) Last encounter: My last encounter with her was on 07/08/2020. Pertinent problems: [name] has Generalized anxiety disorder; Fibromyalgia; Chronic bilateral thoracic back pain; Cervicalgia; Chronic knee pain after total replacement of left knee joint; and Bilateral primary osteoarthritis of knee on their pertinent problem list. Pain Assessment: Severity of Chronic pain is reported as a 10-Worst pain ever/10. Location: Back Lower/denies. Onset: More than a month ago. Quality: Aching, Sharp, Shooting. Timing: Constant. Modifying factor(s): nothing helps. Vitals:  height is '5\' 4"'  (1.626 m) and weight is 170 lb (77.1 kg). Her temperature is 97.5 F (36.4 C) (abnormal). Her blood pressure is 143/107 (abnormal) and her pulse is 102 (abnormal). Her respiration is 18 and oxygen saturation is 97%.   Reason for encounter: medication management.   Patient's last visit with me was in November 2021.  At that time she was managed on the [facility] patch.  Of note, she had an  episode in the skilled nursing facility after her surgery where she developed hypotension presented to the emergency department.  It was recommended that she discontinue her Butrans patch at that time.  She states that her pain has been very difficult to manage since discontinued Butrans patch.  She is having difficulty walking, difficulty sleeping.  Her mood is worse.  We discussed reintroducing Butrans patch albeit at a lower dose, 7.5 mcg an hour to assist with chronic pain management.  I stressed the importance of taking her medication as prescribed and not taking medications that are not prescribed to her.  Her son Catalina Antigua was present for the duration of her encounter and also reinforced what I said to her.  We will also administer intramuscular Norflex and Toradol given increased pain today.   Pharmacotherapy Assessment   07/03/2020  04/22/2020   2  Buprenorphine 20 Mcg/hr Patch 4.00  28  Bi Lat  [id number]  Gib (4800)  2/2  0.48 mg  Medicare  [place]      Analgesic:  Butrans patch '@7' .5 mcg/h  Monitoring: [place] PMP: PDMP not reviewed this encounter.       Pharmacotherapy: No side-effects or adverse reactions reported. Compliance: No problems identified. Effectiveness: Clinically acceptable.    UDS:  Summary  Date Value Ref Range Status  05/16/2018 FINAL  Final    Comment:    ==================================================================== TOXASSURE COMP DRUG ANALYSIS,UR ==================================================================== Test                             Result       Flag       Units Drug Present and Declared for Prescription Verification  Sertraline                     PRESENT      EXPECTED   Desmethylsertraline            PRESENT      EXPECTED    Desmethylsertraline is an expected metabolite of sertraline.   Trazodone                      PRESENT      EXPECTED   1,3 chlorophenyl piperazine    PRESENT      EXPECTED    1,3-chlorophenyl piperazine is an expected  metabolite of    trazodone. Drug Present not Declared for Prescription Verification   Buprenorphine                  11           UNEXPECTED ng/mg creat   Norbuprenorphine               45           UNEXPECTED ng/mg creat    Source of buprenorphine is a scheduled prescription medication.    Norbuprenorphine is an expected metabolite of buprenorphine.   Methocarbamol                  PRESENT      UNEXPECTED Drug Absent but Declared for Prescription Verification   Tizanidine                     Not Detected UNEXPECTED    Tizanidine, as indicated in the declared medication list, is not    always detected even when used as directed. ==================================================================== Test                      Result    Flag   Units      Ref Range   Creatinine              126              mg/dL      >=20 ==================================================================== Declared Medications:  The flagging and interpretation on this report are based on the  following declared medications.  Unexpected results may arise from  inaccuracies in the declared medications.  **Note: The testing scope of this panel includes these medications:  Sertraline (Zoloft)  Trazodone  **Note: The testing scope of this panel does not include small to  moderate amounts of these reported medications:  Tizanidine  **Note: The testing scope of this panel does not include following  reported medications:  Albuterol (Proventil)  Atorvastatin (Lipitor)  Buspirone (BuSpar) ==================================================================== For clinical consultation, please call [phone]. ====================================================================      ROS  Constitutional: Denies any fever or chills Gastrointestinal: No reported hemesis, hematochezia, vomiting, or acute GI distress Musculoskeletal:  Low back pain, left knee pain Neurological: No reported episodes of acute  onset apraxia, aphasia, dysarthria, agnosia, amnesia, paralysis, loss of coordination, or loss of consciousness  Medication Review  Fluticasone-Umeclidin-Vilant, albuterol, atorvastatin, buprenorphine, busPIRone, enoxaparin, feeding supplement, furosemide, ipratropium-albuterol, multivitamin with minerals, ondansetron, oxyCODONE, polyethylene glycol, sertraline, topiramate, and traZODone  History Review  Allergy: [name] is allergic to gabapentin, mobic [meloxicam], penicillins, and sulfa antibiotics. Drug: [name]  reports no history of drug use. Alcohol:  reports current alcohol use. Tobacco:  reports that she has been smoking cigarettes. She has a 45.00 pack-year smoking history. She  has never used smokeless tobacco. Social: [name]  reports that she has been smoking cigarettes. She has a 45.00 pack-year smoking history. She has never used smokeless tobacco. She reports current alcohol use. She reports that she does not use drugs. Medical:  has a past medical history of Anxiety, Asthma, Chronic pain, Chronic systolic CHF (congestive heart failure) ([place]) (09/22/2019), COPD (chronic obstructive pulmonary disease) ([place]), Depression, DJD (degenerative joint disease), Fibromyalgia, GERD (gastroesophageal reflux disease), Headache, Hyperlipidemia, Lower extremity edema, On home oxygen therapy, Panic attack, Shortness of breath dyspnea, and Sleep disorder. Surgical: [name]  has a past surgical history that includes Abdominal surgery; Carpal tunnel release; Ankle surgery; Knee surgery; Cesarean section; Tonsillectomy; Tubal ligation; Back surgery; Total knee arthroplasty (Left, 04/14/2016); and I & D knee with poly exchange (N/A, 06/11/2020). Family: family history includes Cancer in her mother.  Laboratory Chemistry Profile   Renal Lab Results  Component Value Date   BUN 15 06/27/2020   CREATININE 0.80 06/27/2020   BCR 16 08/01/2015   GFRAA >60 10/11/2019   GFRNONAA >60 06/27/2020      Hepatic Lab Results  Component Value Date   AST 43 (H) 06/27/2020   ALT 45 (H) 06/27/2020   ALBUMIN 2.1 (L) 06/27/2020   ALKPHOS 121 06/27/2020   AMYLASE 39 10/01/2017   LIPASE 18 10/01/2017     Electrolytes Lab Results  Component Value Date   NA 137 06/27/2020   K 3.3 (L) 06/27/2020   CL 105 06/27/2020   CALCIUM 8.1 (L) 06/27/2020   MG 1.8 06/17/2020   PHOS 5.1 (H) 09/04/2015     Bone No results found for: VD25OH, [id number], HE5277OE4, MP5361WE3, 25OHVITD1, 25OHVITD2, 25OHVITD3, TESTOFREE, TESTOSTERONE   Inflammation (CRP: Acute Phase) (ESR: Chronic Phase) Lab Results  Component Value Date   CRP 4.2 (H) 06/17/2020   ESRSEDRATE 69 (H) 06/17/2020   LATICACIDVEN 1.2 06/27/2020       Note: Above Lab results reviewed.  Recent Imaging Review  DG Chest 1 View CLINICAL DATA:  Hypotension  EXAM: CHEST  1 VIEW  COMPARISON:  06/10/2020  FINDINGS: There is progressive collapse and consolidation involving the right upper lobe. Tiny right pleural effusion unchanged. No pneumothorax. No new focal pulmonary infiltrate. Right upper extremity PICC line has been placed with its tip within the SVC. Cardiac size within normal limits. Pulmonary vascularity is normal. Enlargement of the right hilum likely relates to right hilar adenopathy given its interval development.  IMPRESSION: Progressive collapse and consolidation within the right upper lobe. Follow-up chest radiograph is recommended in 3-4 weeks to document complete resolution exclude the presence of an underlying central obstructing mass.  Electronically Signed   By: Fidela Salisbury MD   On: 06/27/2020 22:44 Note: Reviewed        Physical Exam  General appearance: Well nourished, well developed, and well hydrated. In no apparent acute distress Mental status: Alert, oriented x 3 (person, place, & time)       Respiratory: No evidence of acute respiratory distress Eyes: PERLA Vitals: BP (!) 143/107   Pulse (!)  102   Temp (!) 97.5 F (36.4 C)   Resp 18   Ht '5\' 4"'  (1.626 m)   Wt 170 lb (77.1 kg)   SpO2 97%   BMI 29.18 kg/m  BMI: Estimated body mass index is 29.18 kg/m as calculated from the following:   Height as of this encounter: '5\' 4"'  (1.626 m).   Weight as of this encounter: 170 lb (77.1 kg). Ideal:  Ideal body weight: 54.7 kg (120 lb 9.5 oz) Adjusted ideal body weight: 63.7 kg (140 lb 5.7 oz)  Lumbar Spine Area Exam  Skin & Axial Inspection: No masses, redness, or swelling Alignment: Symmetrical Functional ROM: Pain restricted ROM       Stability: No instability detected Muscle Tone/Strength: Functionally intact. No obvious neuro-muscular anomalies detected. Sensory (Neurological): Musculoskeletal pain pattern   Provocative Tests: Hyperextension/rotation test: (+) bilaterally for facet joint pain. Lumbar quadrant test (Kemp's test): (+) bilaterally for facet joint pain.  Gait & Posture Assessment  Ambulation: Limited Gait: Relatively normal for age and body habitus Posture: Difficulty standing up straight, due to pain  Lower Extremity Exam      Side: Right lower extremity   Side: Left lower extremity  Stability: No instability observed           Stability: No instability observed          Skin & Extremity Inspection: Skin color, temperature, and hair growth are WNL. No peripheral edema or cyanosis. No masses, redness, swelling, asymmetry, or associated skin lesions. No contractures.   Skin & Extremity Inspection: Evidence of prior arthroplastic surgery, erythema noted.  Tender to touch.  Staples removed.  Functional ROM: Pain restricted ROM for hip joint           Functional ROM: Pain restricted ROM for hip and knee joints          Muscle Tone/Strength: Functionally intact. No obvious neuro-muscular anomalies detected.   Muscle Tone/Strength: Functionally intact. No obvious neuro-muscular anomalies detected.  Sensory (Neurological): Musculoskeletal pain pattern         Sensory  (Neurological): Musculoskeletal pain pattern        DTR: Patellar: deferred today Achilles: deferred today Plantar: deferred today   DTR: Patellar: deferred today Achilles: deferred today Plantar: deferred today  Palpation: No palpable anomalies   Palpation: No palpable anomalies     Assessment   Status Diagnosis  Controlled Controlled Controlled 1. Fibromyalgia   2. Chronic pain disorder   3. Smoking   4. Bilateral primary osteoarthritis of knee   5. Degeneration of cervical intervertebral disc   6. Cervicalgia   7. Chronic knee pain after total replacement of left knee joint   8. Right hip pain   9. Chronic pain syndrome      Plan of Care   [name] has a current medication list which includes the following long-term medication(s): albuterol, ipratropium-albuterol, trazodone, atorvastatin, enoxaparin, and sertraline.  Pharmacotherapy (Medications Ordered): Meds ordered this encounter  Medications   buprenorphine (BUTRANS) 7.5 MCG/HR    Sig: Place 1 patch onto the skin once a week. For chronic pain syndrome    Dispense:  4 patch    Refill:  2    Chronic Pain: STOP Act (Not applicable) Fill 1 day early if closed on refill date. Avoid benzodiazepines within 8 hours of opioids   orphenadrine (NORFLEX) injection 30 mg   ketorolac (TORADOL) 30 MG/ML injection 30 mg   Smoking cessation counseling: Pt acknowledges the risks of long term smoking, he will try to quit smoking. Options for different medications including nicotine products, chewing gum, patch etc, Wellbutrin and Chantix is discussed   Follow-up plan:   Return in about 3 months (around 09/02/2021) for Medication Management, in person.   Recent Visits No visits were found meeting these conditions. Showing recent visits within past 90 days and meeting all other requirements Today's Visits Date Type Provider Dept  06/02/21 Office Visit  Gillis Santa, MD [facility]  Showing today's visits and  meeting all other requirements Future Appointments Date Type Provider Dept  08/20/21 Appointment Gillis Santa, MD [facility]  Showing future appointments within next 90 days and meeting all other requirements I discussed the assessment and treatment plan with the patient. The patient was provided an opportunity to ask questions and all were answered. The patient agreed with the plan and demonstrated an understanding of the instructions.  Patient advised to call back or seek an in-person evaluation if the symptoms or condition worsens.  Duration of encounter:30 minutes.  Note by: Gillis Santa, MD Date: 06/02/2021; Time: 3:08 PM

## 2021-06-03 ENCOUNTER — Telehealth: Payer: Self-pay

## 2021-06-03 NOTE — Telephone Encounter (Signed)
Called back to pharmacy to get additional insurance information.  The pharmacist re ran the Rx and with an override, they do not require a PA.  States the cost will be $100 and this is after the insurance payment of $262.91.  They will order and have this ready for the patient on tomorrow.    Called patient and conveyed this information to her.  Verbalizes u/o information.

## 2021-06-03 NOTE — Telephone Encounter (Signed)
Called to pharmacy to make sure PA was needed as there was not a request generated through [facility].  They have run the Rx again in an effort to generate the PA need for [place] to complete.

## 2021-06-03 NOTE — Telephone Encounter (Signed)
Pt needs prior auth for her meds

## 2021-08-10 ENCOUNTER — Encounter: Payer: Self-pay | Admitting: Intensive Care

## 2021-08-10 ENCOUNTER — Inpatient Hospital Stay
Admission: EM | Admit: 2021-08-10 | Discharge: 2021-08-13 | DRG: 189 | Disposition: A | Discharge status: Home Health | Payer: Medicare Other | Attending: Internal Medicine | Admitting: Internal Medicine

## 2021-08-10 ENCOUNTER — Emergency Department: Payer: Medicare Other

## 2021-08-10 DIAGNOSIS — R4189 Other symptoms and signs involving cognitive functions and awareness: Secondary | ICD-10-CM | POA: Diagnosis present

## 2021-08-10 DIAGNOSIS — M797 Fibromyalgia: Secondary | ICD-10-CM | POA: Diagnosis present

## 2021-08-10 DIAGNOSIS — Z79899 Other long term (current) drug therapy: Secondary | ICD-10-CM

## 2021-08-10 DIAGNOSIS — W19XXXA Unspecified fall, initial encounter: Secondary | ICD-10-CM

## 2021-08-10 DIAGNOSIS — E669 Obesity, unspecified: Secondary | ICD-10-CM | POA: Diagnosis present

## 2021-08-10 DIAGNOSIS — F1190 Opioid use, unspecified, uncomplicated: Secondary | ICD-10-CM

## 2021-08-10 DIAGNOSIS — R079 Chest pain, unspecified: Secondary | ICD-10-CM

## 2021-08-10 DIAGNOSIS — R7989 Other specified abnormal findings of blood chemistry: Secondary | ICD-10-CM

## 2021-08-10 DIAGNOSIS — Z9981 Dependence on supplemental oxygen: Secondary | ICD-10-CM | POA: Diagnosis not present

## 2021-08-10 DIAGNOSIS — Z7951 Long term (current) use of inhaled steroids: Secondary | ICD-10-CM

## 2021-08-10 DIAGNOSIS — J189 Pneumonia, unspecified organism: Secondary | ICD-10-CM | POA: Diagnosis present

## 2021-08-10 DIAGNOSIS — E875 Hyperkalemia: Secondary | ICD-10-CM | POA: Diagnosis present

## 2021-08-10 DIAGNOSIS — J9621 Acute and chronic respiratory failure with hypoxia: Secondary | ICD-10-CM | POA: Diagnosis present

## 2021-08-10 DIAGNOSIS — E8729 Other acidosis: Secondary | ICD-10-CM | POA: Diagnosis present

## 2021-08-10 DIAGNOSIS — G8929 Other chronic pain: Secondary | ICD-10-CM | POA: Diagnosis present

## 2021-08-10 DIAGNOSIS — R55 Syncope and collapse: Secondary | ICD-10-CM | POA: Diagnosis present

## 2021-08-10 DIAGNOSIS — G9341 Metabolic encephalopathy: Secondary | ICD-10-CM | POA: Diagnosis present

## 2021-08-10 DIAGNOSIS — F17210 Nicotine dependence, cigarettes, uncomplicated: Secondary | ICD-10-CM | POA: Diagnosis present

## 2021-08-10 DIAGNOSIS — I5022 Chronic systolic (congestive) heart failure: Secondary | ICD-10-CM | POA: Diagnosis present

## 2021-08-10 DIAGNOSIS — Z96652 Presence of left artificial knee joint: Secondary | ICD-10-CM | POA: Diagnosis present

## 2021-08-10 DIAGNOSIS — Z20822 Contact with and (suspected) exposure to covid-19: Secondary | ICD-10-CM | POA: Diagnosis present

## 2021-08-10 DIAGNOSIS — K219 Gastro-esophageal reflux disease without esophagitis: Secondary | ICD-10-CM | POA: Diagnosis present

## 2021-08-10 DIAGNOSIS — M1990 Unspecified osteoarthritis, unspecified site: Secondary | ICD-10-CM | POA: Diagnosis present

## 2021-08-10 DIAGNOSIS — K7460 Unspecified cirrhosis of liver: Secondary | ICD-10-CM | POA: Diagnosis present

## 2021-08-10 DIAGNOSIS — I4510 Unspecified right bundle-branch block: Secondary | ICD-10-CM | POA: Diagnosis present

## 2021-08-10 DIAGNOSIS — R4182 Altered mental status, unspecified: Secondary | ICD-10-CM

## 2021-08-10 DIAGNOSIS — E7220 Disorder of urea cycle metabolism, unspecified: Secondary | ICD-10-CM

## 2021-08-10 DIAGNOSIS — M545 Low back pain, unspecified: Secondary | ICD-10-CM | POA: Diagnosis present

## 2021-08-10 DIAGNOSIS — Z886 Allergy status to analgesic agent status: Secondary | ICD-10-CM

## 2021-08-10 DIAGNOSIS — R5381 Other malaise: Secondary | ICD-10-CM | POA: Diagnosis present

## 2021-08-10 DIAGNOSIS — R030 Elevated blood-pressure reading, without diagnosis of hypertension: Secondary | ICD-10-CM | POA: Diagnosis present

## 2021-08-10 DIAGNOSIS — J9622 Acute and chronic respiratory failure with hypercapnia: Secondary | ICD-10-CM | POA: Diagnosis present

## 2021-08-10 DIAGNOSIS — K8020 Calculus of gallbladder without cholecystitis without obstruction: Secondary | ICD-10-CM | POA: Diagnosis present

## 2021-08-10 DIAGNOSIS — Z803 Family history of malignant neoplasm of breast: Secondary | ICD-10-CM

## 2021-08-10 DIAGNOSIS — Z882 Allergy status to sulfonamides status: Secondary | ICD-10-CM

## 2021-08-10 DIAGNOSIS — J441 Chronic obstructive pulmonary disease with (acute) exacerbation: Secondary | ICD-10-CM | POA: Diagnosis present

## 2021-08-10 DIAGNOSIS — N179 Acute kidney failure, unspecified: Secondary | ICD-10-CM | POA: Diagnosis present

## 2021-08-10 DIAGNOSIS — Z6830 Body mass index (BMI) 30.0-30.9, adult: Secondary | ICD-10-CM

## 2021-08-10 DIAGNOSIS — J440 Chronic obstructive pulmonary disease with acute lower respiratory infection: Secondary | ICD-10-CM | POA: Diagnosis present

## 2021-08-10 DIAGNOSIS — S2231XA Fracture of one rib, right side, initial encounter for closed fracture: Secondary | ICD-10-CM | POA: Diagnosis present

## 2021-08-10 DIAGNOSIS — F32A Depression, unspecified: Secondary | ICD-10-CM | POA: Diagnosis present

## 2021-08-10 DIAGNOSIS — E785 Hyperlipidemia, unspecified: Secondary | ICD-10-CM | POA: Diagnosis present

## 2021-08-10 DIAGNOSIS — R1011 Right upper quadrant pain: Secondary | ICD-10-CM | POA: Diagnosis not present

## 2021-08-10 DIAGNOSIS — R778 Other specified abnormalities of plasma proteins: Secondary | ICD-10-CM | POA: Diagnosis present

## 2021-08-10 DIAGNOSIS — F419 Anxiety disorder, unspecified: Secondary | ICD-10-CM | POA: Diagnosis present

## 2021-08-10 DIAGNOSIS — Z888 Allergy status to other drugs, medicaments and biological substances status: Secondary | ICD-10-CM

## 2021-08-10 DIAGNOSIS — M542 Cervicalgia: Secondary | ICD-10-CM | POA: Diagnosis present

## 2021-08-10 DIAGNOSIS — Z79891 Long term (current) use of opiate analgesic: Secondary | ICD-10-CM

## 2021-08-10 DIAGNOSIS — J449 Chronic obstructive pulmonary disease, unspecified: Secondary | ICD-10-CM | POA: Diagnosis present

## 2021-08-10 DIAGNOSIS — Z880 Allergy status to penicillin: Secondary | ICD-10-CM

## 2021-08-10 LAB — URINE DRUG SCREEN, QUALITATIVE (ARMC ONLY)
Amphetamines, Ur Screen: NOT DETECTED
Barbiturates, Ur Screen: NOT DETECTED
Benzodiazepine, Ur Scrn: NOT DETECTED
Cannabinoid 50 Ng, Ur ~~LOC~~: NOT DETECTED
Cocaine Metabolite,Ur ~~LOC~~: NOT DETECTED
MDMA (Ecstasy)Ur Screen: NOT DETECTED
Methadone Scn, Ur: NOT DETECTED
Opiate, Ur Screen: NOT DETECTED
Phencyclidine (PCP) Ur S: NOT DETECTED
Tricyclic, Ur Screen: NOT DETECTED

## 2021-08-10 LAB — CBC
HCT: 43.8 % (ref 36.0–46.0)
Hemoglobin: 13.1 g/dL (ref 12.0–15.0)
MCH: 29.3 pg (ref 26.0–34.0)
MCHC: 29.9 g/dL — ABNORMAL LOW (ref 30.0–36.0)
MCV: 98 fL (ref 80.0–100.0)
Platelets: 166 10*3/uL (ref 150–400)
RBC: 4.47 MIL/uL (ref 3.87–5.11)
RDW: 13.1 % (ref 11.5–15.5)
WBC: 14.6 10*3/uL — ABNORMAL HIGH (ref 4.0–10.5)
nRBC: 0 % (ref 0.0–0.2)

## 2021-08-10 LAB — CBC WITH DIFFERENTIAL/PLATELET
Abs Immature Granulocytes: 0.28 10*3/uL — ABNORMAL HIGH (ref 0.00–0.07)
Basophils Absolute: 0.1 10*3/uL (ref 0.0–0.1)
Basophils Relative: 1 %
Eosinophils Absolute: 0.2 10*3/uL (ref 0.0–0.5)
Eosinophils Relative: 2 %
HCT: 48.1 % — ABNORMAL HIGH (ref 36.0–46.0)
Hemoglobin: 14.2 g/dL (ref 12.0–15.0)
Immature Granulocytes: 2 %
Lymphocytes Relative: 16 %
Lymphs Abs: 1.9 10*3/uL (ref 0.7–4.0)
MCH: 29.2 pg (ref 26.0–34.0)
MCHC: 29.5 g/dL — ABNORMAL LOW (ref 30.0–36.0)
MCV: 98.8 fL (ref 80.0–100.0)
Monocytes Absolute: 0.5 10*3/uL (ref 0.1–1.0)
Monocytes Relative: 4 %
Neutro Abs: 9.3 10*3/uL — ABNORMAL HIGH (ref 1.7–7.7)
Neutrophils Relative %: 75 %
Platelets: 252 10*3/uL (ref 150–400)
RBC: 4.87 MIL/uL (ref 3.87–5.11)
RDW: 12.9 % (ref 11.5–15.5)
WBC: 12.3 10*3/uL — ABNORMAL HIGH (ref 4.0–10.5)
nRBC: 0 % (ref 0.0–0.2)

## 2021-08-10 LAB — COMPREHENSIVE METABOLIC PANEL
ALT: 32 U/L (ref 0–44)
AST: 72 U/L — ABNORMAL HIGH (ref 15–41)
Albumin: 3.7 g/dL (ref 3.5–5.0)
Alkaline Phosphatase: 96 U/L (ref 38–126)
Anion gap: 7 (ref 5–15)
BUN: 10 mg/dL (ref 8–23)
CO2: 37 mmol/L — ABNORMAL HIGH (ref 22–32)
Calcium: 9 mg/dL (ref 8.9–10.3)
Chloride: 93 mmol/L — ABNORMAL LOW (ref 98–111)
Creatinine, Ser: 1.2 mg/dL — ABNORMAL HIGH (ref 0.44–1.00)
GFR, Estimated: 51 mL/min — ABNORMAL LOW (ref >60)
Glucose, Bld: 191 mg/dL — ABNORMAL HIGH (ref 70–99)
Potassium: 5.9 mmol/L — ABNORMAL HIGH (ref 3.5–5.1)
Sodium: 137 mmol/L (ref 135–145)
Total Bilirubin: 0.5 mg/dL (ref 0.3–1.2)
Total Protein: 8.1 g/dL (ref 6.5–8.1)

## 2021-08-10 LAB — URINALYSIS, COMPLETE (UACMP) WITH MICROSCOPIC
Bacteria, UA: NONE SEEN
Bilirubin Urine: NEGATIVE
Glucose, UA: NEGATIVE mg/dL
Hgb urine dipstick: NEGATIVE
Ketones, ur: NEGATIVE mg/dL
Leukocytes,Ua: NEGATIVE
Nitrite: NEGATIVE
Protein, ur: NEGATIVE mg/dL
Specific Gravity, Urine: 1.008 (ref 1.005–1.030)
Squamous Epithelial / HPF: NONE SEEN (ref 0–5)
WBC, UA: NONE SEEN WBC/hpf (ref 0–5)
pH: 8 (ref 5.0–8.0)

## 2021-08-10 LAB — BLOOD GAS, ARTERIAL
Acid-Base Excess: 7.7 mmol/L — ABNORMAL HIGH (ref 0.0–2.0)
Bicarbonate: 38.6 mmol/L — ABNORMAL HIGH (ref 20.0–28.0)
FIO2: 0.4
O2 Saturation: 86.3 %
Patient temperature: 37
pCO2 arterial: 90 mmHg (ref 32.0–48.0)
pH, Arterial: 7.24 — ABNORMAL LOW (ref 7.350–7.450)
pO2, Arterial: 61 mmHg — ABNORMAL LOW (ref 83.0–108.0)

## 2021-08-10 LAB — POC URINE PREG, ED: Preg Test, Ur: NEGATIVE

## 2021-08-10 LAB — CBG MONITORING, ED: Glucose-Capillary: 79 mg/dL (ref 70–99)

## 2021-08-10 LAB — AMMONIA: Ammonia: 136 umol/L — ABNORMAL HIGH (ref 9–35)

## 2021-08-10 MED ORDER — ACETAMINOPHEN 650 MG RE SUPP
650.0000 mg | Freq: Four times a day (QID) | RECTAL | Status: DC | PRN
  Filled 2021-08-10: qty 1

## 2021-08-10 MED ORDER — DEXTROSE 50 % IV SOLN
1.0000 | Freq: Once | INTRAVENOUS | Status: AC
  Administered 2021-08-10: 50 mL via INTRAVENOUS
  Filled 2021-08-10: qty 50

## 2021-08-10 MED ORDER — ONDANSETRON HCL 4 MG PO TABS: 4.0000 mg | ORAL_TABLET | Freq: Four times a day (QID) | ORAL | Status: DC | PRN

## 2021-08-10 MED ORDER — SODIUM CHLORIDE 0.9 % IV SOLN: INTRAVENOUS | Status: DC

## 2021-08-10 MED ORDER — NALOXONE HCL 2 MG/2ML IJ SOSY
1.0000 mg | PREFILLED_SYRINGE | Freq: Once | INTRAMUSCULAR | Status: AC
  Administered 2021-08-10: 1 mg via INTRAVENOUS

## 2021-08-10 MED ORDER — NALOXONE HCL 4 MG/10ML IJ SOLN
2.0000 mg/h | INTRAVENOUS | Status: DC
  Filled 2021-08-10 (×2): qty 10

## 2021-08-10 MED ORDER — ACETAMINOPHEN 325 MG PO TABS
650.0000 mg | ORAL_TABLET | Freq: Four times a day (QID) | ORAL | Status: DC | PRN
  Administered 2021-08-12 – 2021-08-13 (×2): 650 mg via ORAL
  Filled 2021-08-10 (×2): qty 2

## 2021-08-10 MED ORDER — ENOXAPARIN SODIUM 40 MG/0.4ML IJ SOSY
40.0000 mg | PREFILLED_SYRINGE | INTRAMUSCULAR | Status: DC
  Administered 2021-08-11 – 2021-08-13 (×3): 40 mg via SUBCUTANEOUS
  Filled 2021-08-10 (×3): qty 0.4

## 2021-08-10 MED ORDER — LACTULOSE 10 GM/15ML PO SOLN
30.0000 g | Freq: Three times a day (TID) | ORAL | Status: DC
  Administered 2021-08-10 – 2021-08-13 (×5): 30 g via ORAL
  Filled 2021-08-10 (×6): qty 60

## 2021-08-10 MED ORDER — LACTATED RINGERS IV BOLUS
1000.0000 mL | Freq: Once | INTRAVENOUS | Status: AC
  Administered 2021-08-10: 1000 mL via INTRAVENOUS

## 2021-08-10 MED ORDER — INSULIN ASPART 100 UNIT/ML IV SOLN
10.0000 [IU] | Freq: Once | INTRAVENOUS | Status: AC
  Administered 2021-08-10: 10 [IU] via INTRAVENOUS
  Filled 2021-08-10: qty 0.1

## 2021-08-10 MED ORDER — VANCOMYCIN HCL IN DEXTROSE 1-5 GM/200ML-% IV SOLN
1000.0000 mg | Freq: Once | INTRAVENOUS | Status: AC
  Administered 2021-08-10: 1000 mg via INTRAVENOUS
  Filled 2021-08-10: qty 200

## 2021-08-10 MED ORDER — IPRATROPIUM-ALBUTEROL 0.5-2.5 (3) MG/3ML IN SOLN
3.0000 mL | RESPIRATORY_TRACT | Status: DC | PRN
  Administered 2021-08-11: 3 mL via RESPIRATORY_TRACT
  Filled 2021-08-10: qty 3

## 2021-08-10 MED ORDER — LACTULOSE 10 GM/15ML PO SOLN
30.0000 g | Freq: Once | ORAL | Status: DC
  Filled 2021-08-10: qty 60

## 2021-08-10 MED ORDER — SODIUM CHLORIDE 0.9 % IV SOLN
2.0000 g | Freq: Once | INTRAVENOUS | Status: AC
  Administered 2021-08-10: 2 g via INTRAVENOUS
  Filled 2021-08-10: qty 2

## 2021-08-10 MED ORDER — PIPERACILLIN-TAZOBACTAM 3.375 G IVPB
3.3750 g | Freq: Three times a day (TID) | INTRAVENOUS | Status: DC
  Administered 2021-08-11 – 2021-08-13 (×8): 3.375 g via INTRAVENOUS
  Filled 2021-08-10 (×8): qty 50

## 2021-08-10 MED ORDER — NALOXONE HCL 2 MG/2ML IJ SOSY
PREFILLED_SYRINGE | INTRAMUSCULAR | Status: AC
  Filled 2021-08-10: qty 2

## 2021-08-10 MED ORDER — PIPERACILLIN-TAZOBACTAM 3.375 G IVPB 30 MIN
3.3750 g | INTRAVENOUS | Status: AC
  Administered 2021-08-11: 3.375 g via INTRAVENOUS
  Filled 2021-08-10: qty 50

## 2021-08-10 MED ORDER — IPRATROPIUM-ALBUTEROL 0.5-2.5 (3) MG/3ML IN SOLN: 3.0000 mL | RESPIRATORY_TRACT | Status: DC | PRN

## 2021-08-10 MED ORDER — PATIROMER SORBITEX CALCIUM 8.4 G PO PACK: 8.4000 g | PACK | Freq: Every day | ORAL | Status: DC

## 2021-08-10 MED ORDER — SODIUM ZIRCONIUM CYCLOSILICATE 10 G PO PACK
10.0000 g | PACK | Freq: Once | ORAL | Status: DC
  Filled 2021-08-10: qty 1

## 2021-08-10 MED ORDER — ONDANSETRON HCL 4 MG/2ML IJ SOLN: 4.0000 mg | Freq: Four times a day (QID) | INTRAMUSCULAR | Status: DC | PRN

## 2021-08-10 MED ORDER — PIPERACILLIN-TAZOBACTAM 3.375 G IVPB: 3.3750 g | Freq: Once | INTRAVENOUS | Status: DC

## 2021-08-10 NOTE — H&P (Addendum)
History and Physical    Bethany Villa FAO:[id number] DOB: 1955/11/10 DOA: 08/10/2021  PCP: [name], MD   Patient coming from: home  I have personally briefly reviewed patient's relevant medical records in [facility]  Chief Complaint: unresponsiveness  HPI: Bethany Villa is a 65 y.o. female with medical history significant for COPD, chronic hypoxic and hypercapnic respiratory failure on home O2 at 2 L, previously a candidate for home trilogy machine, systolic CHF (EF 40 to 45% 05/2019), chronic neck and back pain and fibromyalgia on chronic opiates, who presents by EMS due to being found unresponsive.  History is limited due to her clinical condition.  Has a history of hospitalization in February 2021 for unresponsiveness believed secondary to noncompliance with home oxygen in combination with buprenorphine patch.  Patient received a dose of Narcan with EMS with minimal response and arrived being bagged.  ED course: Tachycardic at 112 on arrival with BP 118/73 90/70, respirations 28-22 with O2 sat 74% on 4 L, transitioned to nonrebreather.  Minimal improvement with a repeat dose of Narcan Blood work: UDS clean.  Ammonia 136 WBC 12,000.  No lactic acid or procalcitonin Potassium 5.9, bicarb 37, creatinine 1.20 up from baseline of 0.6  Urinalysis unremarkable COVID and flu pending  EKG: Personally viewed and interpreted, sinus tachycardia at 114 with no acute ST-T wave changes  Chest x-ray: Upper lung predominant heterogeneous opacities concerning for infection CT head nonacute  Patient became more responsive while in the emergency room and was more awake and alert by admission Started on cefepime and vancomycin for pneumonia Started on lactulose via NG tube for hyperammonemia Hospitalist consulted for admission   Review of Systems: Unable to obtain due to altered mental status     Assessment/Plan   Acute metabolic encephalopathy   Unresponsiveness - Believe  multifactorial hypercapnic respiratory failure, hyperammonemia primarily, opioid use in spite of negative UDS - UDS negative head CT negative - Follow acetaminophen and salicylate levels - Awaiting VBG at this time - Treat potential etiologies    Acute on chronic respiratory failure with hypercapnia (HCC)   Decompensated respiratory acidosis - Arrived being bagged, transition to NRB then O2 via nasal cannula - Serum bicarb 37, ABG ordered (Addendum post admission: pH 7.2, p CO2 95 -Bipap ordered)) - Patient on chronic home O2 but was previously a candidate for trilogy - Continue supplemental O2 with BiPAP as needed   COPD (chronic obstructive pulmonary disease) (HCC) -DuoNebs every 6 and as needed - See IV Solu-Medrol   Pneumonia, possible aspiration - Zosyn - Supplemental O2 - Follow procalcitonin and lactic acid    Chronic systolic CHF (congestive heart failure) (HCC) - Not acutely exacerbated - We will hold home Lasix for now given borderline blood pressure    Hyperammonemia (HCC) - Continue lactulose - Right upper quadrant sonogram to evaluate for undiagnosed cirrhosis    Chronic, continuous use of opioids - Patient did not respond to Narcan and UDS negative for opioids - Judicious opioid use for pain    AKI (acute kidney injury) (HCC)   Hyperkalemia - Creatinine 1.2 above baseline of 0.6 - IV hydration and monitor renal function  Hyperkalemia - Insulin and dextrose and Lokelma - Continue to monitor and keep on continuous cardiac monitoring      Depression - Continue home Zoloft   DVT prophylaxis: Lovenox  Code Status: full code  Family Communication:  none  Disposition Plan: Back to previous home environment Consults called: none  Status:At  the time of admission, it appears that the appropriate admission status for this patient is INPATIENT. This is judged to be reasonable and necessary in order to provide the required intensity of service to ensure the  patient's safety given the presenting symptoms, physical exam findings, and initial radiographic and laboratory data in the context of their  Comorbid conditions.   Patient requires inpatient status due to high intensity of service, high risk for further deterioration and high frequency of surveillance required.   I certify that at the point of admission it is my clinical judgment that the patient will require inpatient hospital care spanning beyond 2 midnights     Physical Exam: Vitals:   08/10/21 2130 08/10/21 2151 08/10/21 2153 08/10/21 2155  BP:   105/81   Pulse: 94 (!) 101 99 (!) 101  Resp: (!) 23 (!) 27 (!) 22 (!) 22  SpO2: 100% (!) 74% (!) 83% 100%   Constitutional: Lethargic.  Tachypneic, speaking in 1-2 word sentences HEENT:      Head: Normocephalic and atraumatic.         Eyes: PERLA, EOMI, Conjunctivae are normal. Sclera is non-icteric.       Mouth/Throat: Mucous membranes are moist.       Neck: Supple with no signs of meningismus. Cardiovascular: Tachycardic. No murmurs, gallops, or rubs. 2+ symmetrical distal pulses are present . No JVD. No  LE edema Respiratory: Respiratory effort increased.decreased air movement, rhonchi bilaterally..  Gastrointestinal: Soft, non tender, non distended. Positive bowel sounds.  Genitourinary: No CVA tenderness. Musculoskeletal: Nontender with normal range of motion in all extremities. No cyanosis, or erythema of extremities. Neurologic:  Face is symmetric. Moving all extremities. No gross focal neurologic deficits . Skin: Skin is warm, dry.  No rash or ulcers Psychiatric: Unable to assess due to altered mental status    Past Medical History:  Diagnosis Date   Anxiety    Asthma    Chronic pain    lower back   Chronic systolic CHF (congestive heart failure) (HCC) 09/22/2019   COPD (chronic obstructive pulmonary disease) (HCC)    Depression    DJD (degenerative joint disease)    Fibromyalgia    GERD (gastroesophageal reflux disease)     Headache    Hyperlipidemia    Lower extremity edema    On home oxygen therapy    has not been on since 2016   Panic attack    Shortness of breath dyspnea    Sleep disorder     Past Surgical History:  Procedure Laterality Date   ABDOMINAL SURGERY     ANKLE SURGERY     BACK SURGERY     lower   CARPAL TUNNEL RELEASE     CESAREAN SECTION     I & D KNEE WITH POLY EXCHANGE N/A 06/11/2020   Procedure: IRRIGATION AND DEBRIDEMENT KNEE WITH POLY EXCHANGE;  Surgeon: Christena Flake, MD;  Location: [facility] ORS;  Service: Orthopedics;  Laterality: N/A;   KNEE SURGERY     TONSILLECTOMY     TOTAL KNEE ARTHROPLASTY Left 04/14/2016   Procedure: TOTAL KNEE ARTHROPLASTY;  Surgeon: Deeann Saint, MD;  Location: [facility] ORS;  Service: Orthopedics;  Laterality: Left;   TUBAL LIGATION       reports that she has been smoking cigarettes. She has a 45.00 pack-year smoking history. She has never used smokeless tobacco. She reports current alcohol use. She reports that she does not use drugs.  Allergies  Allergen Reactions   Gabapentin Shortness Of  Breath and Rash   Mobic [Meloxicam] Anaphylaxis   Penicillins Shortness Of Breath and Swelling    Has patient had a PCN reaction causing immediate rash, facial/tongue/throat swelling, SOB or lightheadedness with hypotension: unknown Has patient had a PCN reaction causing severe rash involving mucus membranes or skin necrosis: {unknown Has patient had a PCN reaction that required hospitalization {unknown Has patient had a PCN reaction occurring within the last 10 years: no If all of the above answers are "NO", then may proceed with Cephalosporin use.   Sulfa Antibiotics Shortness Of Breath and Swelling    Family History  Problem Relation Age of Onset   Cancer Mother        beast  Eino Farber been monitored for   Prior to Admission medications   Medication Sig Start Date End Date Taking? Authorizing Provider  albuterol (VENTOLIN HFA) 108 (90 Base) MCG/ACT  inhaler INHALE 2 PUFFS BY MOUTH EVERY 6 HOURS AS NEEDED FOR WHEEZE 10/20/18   [provider]  atorvastatin (LIPITOR) 20 MG tablet TAKE 1 TABLET (20 MG TOTAL) BY MOUTH DAILY. Patient not taking: Reported on 06/02/2021 04/29/16   Bennie Pierini, FNP  buprenorphine Lavera Guise) 7.5 MCG/HR Place 1 patch onto the skin once a week. For chronic pain syndrome 06/02/21 08/25/21  Edward Jolly, MD  busPIRone (BUSPAR) 5 MG tablet Take 5 mg by mouth 3 (three) times daily.  Patient not taking: Reported on 06/02/2021    [provider]  enoxaparin (LOVENOX) 40 MG/0.4ML injection Inject 0.4 mLs (40 mg total) into the skin daily for 14 doses. 06/13/20 06/27/20  Dedra Skeens, PA-C  feeding supplement (ENSURE ENLIVE / ENSURE PLUS) LIQD Take 237 mLs by mouth 2 (two) times daily between meals. Patient not taking: Reported on 06/02/2021 06/17/20   Darlin Priestly, MD  Fluticasone-Umeclidin-Vilant (TRELEGY ELLIPTA) 100-62.5-25 MCG/INH AEPB Inhale 1 puff into the lungs daily. Patient not taking: Reported on 06/02/2021 07/11/19   [provider]  furosemide (LASIX) 40 MG tablet Take 40 mg by mouth daily as needed.  07/11/19   [provider]  ipratropium-albuterol (DUONEB) 0.5-2.5 (3) MG/3ML SOLN Take 3 mLs by nebulization every 6 (six) hours as needed. 06/17/20   Darlin Priestly, MD  Multiple Vitamin (MULTIVITAMIN WITH MINERALS) TABS tablet Take 1 tablet by mouth daily. Patient not taking: Reported on 06/02/2021 06/17/20   Darlin Priestly, MD  ondansetron (ZOFRAN) 4 MG tablet Take 4 mg by mouth as needed for nausea/vomiting. Patient not taking: Reported on 06/02/2021 09/04/19   [provider]  oxyCODONE (OXY IR/ROXICODONE) 5 MG immediate release tablet Take 1 tablet (5 mg total) by mouth every 6 (six) hours as needed for moderate pain (pain score 4-6). Patient not taking: Reported on 06/02/2021 06/17/20   Darlin Priestly, MD  polyethylene glycol (MIRALAX / GLYCOLAX) 17 g packet Take 34 g by mouth 2 (two)  times daily. Patient not taking: Reported on 06/02/2021 06/17/20   Darlin Priestly, MD  sertraline (ZOLOFT) 50 MG tablet Take 2 tablets (100 mg total) by mouth daily. Patient not taking: Reported on 06/02/2021 06/17/20   Darlin Priestly, MD  topiramate (TOPAMAX) 25 MG tablet Take 25 mg by mouth 2 (two) times daily. Patient not taking: Reported on 06/02/2021 08/02/19   [provider]  traZODone (DESYREL) 50 MG tablet Take 1 tablet (50 mg total) by mouth at bedtime as needed for sleep. 06/17/20   Darlin Priestly, MD      Labs on Admission: I have personally reviewed following labs and imaging studies  CBC: Recent Labs  Lab 08/10/21 1821  WBC 12.3*  NEUTROABS 9.3*  HGB 14.2  HCT 48.1*  MCV 98.8  PLT 252   Basic Metabolic Panel: Recent Labs  Lab 08/10/21 1821  NA 137  K 5.9*  CL 93*  CO2 37*  GLUCOSE 191*  BUN 10  CREATININE 1.20*  CALCIUM 9.0   GFR: CrCl cannot be calculated (Unknown ideal weight.). Liver Function Tests: Recent Labs  Lab 08/10/21 1821  AST 72*  ALT 32  ALKPHOS 96  BILITOT 0.5  PROT 8.1  ALBUMIN 3.7   No results for input(s): LIPASE, AMYLASE in the last 168 hours. Recent Labs  Lab 08/10/21 1821  AMMONIA 136*   Coagulation Profile: No results for input(s): INR, PROTIME in the last 168 hours. Cardiac Enzymes: No results for input(s): CKTOTAL, CKMB, CKMBINDEX, TROPONINI in the last 168 hours. BNP (last 3 results) No results for input(s): PROBNP in the last 8760 hours. HbA1C: No results for input(s): HGBA1C in the last 72 hours. CBG: No results for input(s): GLUCAP in the last 168 hours. Lipid Profile: No results for input(s): CHOL, HDL, LDLCALC, TRIG, CHOLHDL, LDLDIRECT in the last 72 hours. Thyroid Function Tests: No results for input(s): TSH, T4TOTAL, FREET4, T3FREE, THYROIDAB in the last 72 hours. Anemia Panel: No results for input(s): VITAMINB12, FOLATE, FERRITIN, TIBC, IRON, RETICCTPCT in the last 72 hours. Urine analysis:    Component  Value Date/Time   COLORURINE YELLOW (A) 08/10/2021 1832   APPEARANCEUR CLEAR (A) 08/10/2021 1832   APPEARANCEUR Hazy 08/22/2013 1111   LABSPEC 1.008 08/10/2021 1832   LABSPEC 1.016 08/22/2013 1111   PHURINE 8.0 08/10/2021 1832   GLUCOSEU NEGATIVE 08/10/2021 1832   GLUCOSEU Negative 08/22/2013 1111   HGBUR NEGATIVE 08/10/2021 1832   BILIRUBINUR NEGATIVE 08/10/2021 1832   BILIRUBINUR Negative 08/22/2013 1111   KETONESUR NEGATIVE 08/10/2021 1832   PROTEINUR NEGATIVE 08/10/2021 1832   UROBILINOGEN 0.2 07/24/2014 0635   NITRITE NEGATIVE 08/10/2021 1832   LEUKOCYTESUR NEGATIVE 08/10/2021 1832   LEUKOCYTESUR 1+ 08/22/2013 1111    Radiological Exams on Admission: CT HEAD WO CONTRAST ( )  Result Date: 08/10/2021 CLINICAL DATA:  Mental status change. EXAM: CT HEAD WITHOUT CONTRAST TECHNIQUE: Contiguous axial images were obtained from the base of the skull through the vertex without intravenous contrast. COMPARISON:  Head CT dated October 07, 2019 FINDINGS: Brain: No evidence of acute infarction, hemorrhage, hydrocephalus, extra-axial collection or mass lesion/mass effect. Vascular: No hyperdense vessel or unexpected calcification. Skull: Normal. Negative for fracture or focal lesion. Sinuses/Orbits: No acute finding. Other: None. IMPRESSION: No acute intracranial abnormality. Electronically Signed   By: Allegra Lai M.D.   On: 08/10/2021 21:11   DG Chest Portable 1 View  Result Date: 08/10/2021 CLINICAL DATA:  Shortness of breath EXAM: PORTABLE CHEST 1 VIEW COMPARISON:  Chest x-ray dated June 27, 2020 FINDINGS: Cardiac and mediastinal contours are unchanged. Upper lung predominant heterogeneous opacities. No evidence of pleural effusion or pneumothorax. IMPRESSION: Upper lung predominant heterogeneous opacities, concerning for infection. Electronically Signed   By: Allegra Lai M.D.   On: 08/10/2021 19:01       Andris Baumann MD [facility]   08/10/2021, 10:19 PM

## 2021-08-10 NOTE — Progress Notes (Signed)
Pharmacy Antibiotic Note  Bethany Villa is a 65 y.o. female admitted on 08/10/2021 with aspiration pneumonia.  Pharmacy has been consulted for Zosyn dosing.  Plan: Pt with h/o PCN causing swelling/SOB report 05/2011, but given Zosyn at [facility] on 09/01/2016. Ordered initial dose of Zosyn 3.375 g once, followed by Zosyn 3.375 q8hr (extended infusion) for 5 days per indication and current renal function.  Pharmacy will continue to follow and with adjust and/or extend abx dosing as warranted.  Recent Labs  Lab 08/10/21 1821 08/10/21 2235  WBC 12.3* 14.6*  CREATININE 1.20*  --     CrCl cannot be calculated (Unknown ideal weight.).    Allergies  Allergen Reactions   Gabapentin Shortness Of Breath and Rash   Mobic [Meloxicam] Anaphylaxis   Penicillins Shortness Of Breath and Swelling    Has patient had a PCN reaction causing immediate rash, facial/tongue/throat swelling, SOB or lightheadedness with hypotension: unknown Has patient had a PCN reaction causing severe rash involving mucus membranes or skin necrosis: {unknown Has patient had a PCN reaction that required hospitalization {unknown Has patient had a PCN reaction occurring within the last 10 years: no If all of the above answers are "NO", then may proceed with Cephalosporin use.   Sulfa Antibiotics Shortness Of Breath and Swelling    Antimicrobials this admission: 12/12 Vancomycin >> x 1 dose 12/12 Cefepime >> x 1 dose 12/13 Zosyn >> x 5 days  Microbiology results: 12/12 BCx: Pending  Thank you for allowing pharmacy to be a part of this patient's care.  Otelia Sergeant, PharmD, [facility] 08/11/2021 2:56 AM

## 2021-08-10 NOTE — ED Provider Notes (Signed)
[facility] Emergency Department Provider Note  ____________________________________________   I have reviewed the triage vital signs and the nursing notes.   HISTORY  Chief Complaint Unresponsiveness  History limited by: Altered Mental Status   HPI EMMALIA Villa is a 65 y.o. female who presents to the emergency department today via EMS because of being found unresponsive.  Patient was found by family.  It is unclear how long she had been altered.  When EMS arrived the patient was having a difficult time breathing on her own.  Total of 3 mg of Narcan were delivered on scene.  Patient did have some minimal response to that.  Patient was being bagged during transport.  Always had pulses.  Per EMS family reported patient has used pain patches in the past.   Records reviewed. Per medical record review patient has a history of COPD, depression,   Past Medical History:  Diagnosis Date   Anxiety    Asthma    Chronic pain    lower back   Chronic systolic CHF (congestive heart failure) (HCC) 09/22/2019   COPD (chronic obstructive pulmonary disease) (HCC)    Depression    DJD (degenerative joint disease)    Fibromyalgia    GERD (gastroesophageal reflux disease)    Headache    Hyperlipidemia    Lower extremity edema    On home oxygen therapy    has not been on since 2016   Panic attack    Shortness of breath dyspnea    Sleep disorder     Patient Active Problem List   Diagnosis Date Noted   Acute hypercapnic respiratory failure (HCC) 10/07/2019   Altered mental status    Chronic systolic CHF (congestive heart failure) (HCC) 09/22/2019   Degeneration of cervical intervertebral disc 07/05/2019   Bilateral primary osteoarthritis of knee 04/26/2019   Chronic bilateral thoracic back pain 12/05/2018   Cervicalgia 12/05/2018   Chronic knee pain after total replacement of left knee joint 12/05/2018   Acute adjustment disorder with anxiety 10/06/2017    Palliative care by specialist    Respiratory failure (HCC) 10/01/2017   COPD exacerbation (HCC) 09/01/2016   Elevated brain natriuretic peptide (BNP) level 09/01/2016   LFT elevation 09/01/2016   Total knee replacement status 04/14/2016   Chronic obstructive pulmonary disease with acute lower respiratory infection (HCC)    Acute respiratory failure with hypoxia and hypercapnia (HCC)    Sepsis (HCC) 09/03/2015   CAP (community acquired pneumonia) 09/01/2015   SIRS (systemic inflammatory response syndrome) (HCC) 07/24/2014   COPD with acute exacerbation (HCC) 09/29/2013   Acute on chronic respiratory failure with hypercapnia (HCC) 11/27/2012   Insomnia 11/04/2012   Generalized anxiety disorder 11/04/2012   Depression 11/04/2012   Fibromyalgia 11/04/2012   Metabolic encephalopathy 10/03/2011   COPD (chronic obstructive pulmonary disease) (HCC) 06/01/2011   Smoking 05/19/2011   Obesity 05/19/2011   Hyperglycemia 05/19/2011    Past Surgical History:  Procedure Laterality Date   ABDOMINAL SURGERY     ANKLE SURGERY     BACK SURGERY     lower   CARPAL TUNNEL RELEASE     CESAREAN SECTION     I & D KNEE WITH POLY EXCHANGE N/A 06/11/2020   Procedure: IRRIGATION AND DEBRIDEMENT KNEE WITH POLY EXCHANGE;  Surgeon: Christena Flake, MD;  Location: [facility] ORS;  Service: Orthopedics;  Laterality: N/A;   KNEE SURGERY     TONSILLECTOMY     TOTAL KNEE ARTHROPLASTY Left 04/14/2016   Procedure:  TOTAL KNEE ARTHROPLASTY;  Surgeon: Deeann Saint, MD;  Location: [facility] ORS;  Service: Orthopedics;  Laterality: Left;   TUBAL LIGATION      Prior to Admission medications   Medication Sig Start Date End Date Taking? Authorizing Provider  albuterol (VENTOLIN HFA) 108 (90 Base) MCG/ACT inhaler INHALE 2 PUFFS BY MOUTH EVERY 6 HOURS AS NEEDED FOR WHEEZE 10/20/18   [provider]  atorvastatin (LIPITOR) 20 MG tablet TAKE 1 TABLET (20 MG TOTAL) BY MOUTH DAILY. Patient not taking: Reported on 06/02/2021  04/29/16   Bennie Pierini, FNP  buprenorphine Lavera Guise) 7.5 MCG/HR Place 1 patch onto the skin once a week. For chronic pain syndrome 06/02/21 08/25/21  Edward Jolly, MD  busPIRone (BUSPAR) 5 MG tablet Take 5 mg by mouth 3 (three) times daily.  Patient not taking: Reported on 06/02/2021    [provider]  enoxaparin (LOVENOX) 40 MG/0.4ML injection Inject 0.4 mLs (40 mg total) into the skin daily for 14 doses. 06/13/20 06/27/20  Dedra Skeens, PA-C  feeding supplement (ENSURE ENLIVE / ENSURE PLUS) LIQD Take 237 mLs by mouth 2 (two) times daily between meals. Patient not taking: Reported on 06/02/2021 06/17/20   Darlin Priestly, MD  Fluticasone-Umeclidin-Vilant (TRELEGY ELLIPTA) 100-62.5-25 MCG/INH AEPB Inhale 1 puff into the lungs daily. Patient not taking: Reported on 06/02/2021 07/11/19   [provider]  furosemide (LASIX) 40 MG tablet Take 40 mg by mouth daily as needed.  07/11/19   [provider]  ipratropium-albuterol (DUONEB) 0.5-2.5 (3) MG/3ML SOLN Take 3 mLs by nebulization every 6 (six) hours as needed. 06/17/20   Darlin Priestly, MD  Multiple Vitamin (MULTIVITAMIN WITH MINERALS) TABS tablet Take 1 tablet by mouth daily. Patient not taking: Reported on 06/02/2021 06/17/20   Darlin Priestly, MD  ondansetron (ZOFRAN) 4 MG tablet Take 4 mg by mouth as needed for nausea/vomiting. Patient not taking: Reported on 06/02/2021 09/04/19   [provider]  oxyCODONE (OXY IR/ROXICODONE) 5 MG immediate release tablet Take 1 tablet (5 mg total) by mouth every 6 (six) hours as needed for moderate pain (pain score 4-6). Patient not taking: Reported on 06/02/2021 06/17/20   Darlin Priestly, MD  polyethylene glycol (MIRALAX / GLYCOLAX) 17 g packet Take 34 g by mouth 2 (two) times daily. Patient not taking: Reported on 06/02/2021 06/17/20   Darlin Priestly, MD  sertraline (ZOLOFT) 50 MG tablet Take 2 tablets (100 mg total) by mouth daily. Patient not taking: Reported on 06/02/2021 06/17/20   Darlin Priestly,  MD  topiramate (TOPAMAX) 25 MG tablet Take 25 mg by mouth 2 (two) times daily. Patient not taking: Reported on 06/02/2021 08/02/19   [provider]  traZODone (DESYREL) 50 MG tablet Take 1 tablet (50 mg total) by mouth at bedtime as needed for sleep. 06/17/20   Darlin Priestly, MD    Allergies Gabapentin, Mobic [meloxicam], Penicillins, and Sulfa antibiotics  Family History  Problem Relation Age of Onset   Cancer Mother        beast    Social History Social History   Tobacco Use   Smoking status: Every Day    Packs/day: 1.00    Years: 45.00    Pack years: 45.00    Types: Cigarettes   Smokeless tobacco: Never  Vaping Use   Vaping Use: Never used  Substance Use Topics   Alcohol use: Yes    Alcohol/week: 0.0 standard drinks    Comment: rare   Drug use: No    Review of Systems Unable to  obtain secondary to mental status.  ____________________________________________   PHYSICAL EXAM:  VITAL SIGNS: ED Triage Vitals [08/10/21 1824]  Enc Vitals Group     BP 118/73     Pulse Rate (!) 112     Resp      Temp      Temp src      SpO2 100 %    Constitutional: Responsive to painful stimuli. Eyes: Bilateral dilated pupils. ENT      Head: Normocephalic and atraumatic.      Nose: No congestion/rhinnorhea.      Mouth/Throat: Mucous membranes are moist.      Neck: No stridor. Hematological/Lymphatic/Immunilogical: No cervical lymphadenopathy. Cardiovascular: Tachycardic, regular rhythm.  No murmurs, rubs, or gallops.  Respiratory: Slightly increased respiratory effort.  Gastrointestinal: Soft and non tender. No rebound. No guarding.  Genitourinary: Deferred Musculoskeletal: Normal range of motion in all extremities. No lower extremity edema. Neurologic:  Responsive to painful stimuli. Moving all extremities.  Skin:  Skin is warm, dry and intact. No rash noted. ____________________________________________    LABS (pertinent positives/negatives)  Upreg  negative Ammonia 136 CMP na 137, k 5.9, glu 191, cr 1.20 CBC wbc 12.3, hgb 14.2, plt 252 UA clear ____________________________________________   EKG  I, Phineas Semen, attending physician, personally viewed and interpreted this EKG  EKG Time: 1820 Rate: 114 Rhythm: sinus tachycardia Axis: left axis deviation Intervals: qtc 491 QRS: RBBB ST changes: no st elevation Impression: abnormal ekg  ____________________________________________    RADIOLOGY  CXR Upper airway opacities concerning for infection  ____________________________________________   PROCEDURES  Procedures  CRITICAL CARE Performed by: Phineas Semen   Total critical care time: 40 minutes  Critical care time was exclusive of separately billable procedures and treating other patients.  Critical care was necessary to treat or prevent imminent or life-threatening deterioration.  Critical care was time spent personally by me on the following activities: development of treatment plan with patient and/or surrogate as well as nursing, discussions with consultants, evaluation of patient's response to treatment, examination of patient, obtaining history from patient or surrogate, ordering and performing treatments and interventions, ordering and review of laboratory studies, ordering and review of radiographic studies, pulse oximetry and re-evaluation of patient's condition.  ____________________________________________   INITIAL IMPRESSION / ASSESSMENT AND PLAN / ED COURSE  Pertinent labs & imaging results that were available during my care of the patient were reviewed by me and considered in my medical decision making (see chart for details).   Patient presented to the emergency department today after being found unresponsive.  On arrival to the emergency department patient was responsive to painful stimuli.  Per EMS report patient had history of opioid use in the past so further Narcan was given here.   Patient's oxygen level was fine on nonrebreather.  Broad work-up was initiated for altered mental status.  Interestingly the patient's ammonia was found to be elevated.  CT head without any acute findings.  UDS was negative.  Do not see any history the patient has cirrhosis.  Additionally cxr was concerning for pna and IV abx were started. While work-up was being completed the patient's mental status did significantly improved to the point where she was simply awake and alert in the room.  Was able to answer questions easily.  This all came about without any specific treatment for the elevated ammonia level.  This time somewhat unclear etiology of the patient's altered mental status but will plan on admission to the hospital service for further work-up  and management.  ____________________________________________   FINAL CLINICAL IMPRESSION(S) / ED DIAGNOSES  Final diagnoses:  Increased ammonia level  Altered mental status, unspecified altered mental status type  Pneumonia due to infectious organism, unspecified laterality, unspecified part of lung     Note: This dictation was prepared with Dragon dictation. Any transcriptional errors that result from this process are unintentional     Phineas Semen, MD 08/10/21 2245

## 2021-08-10 NOTE — ED Triage Notes (Signed)
Arrived by EMS from home for unresponsiveness. Daughter reports patient is known for getting pain patches from others. Narcan 3mg  given on scene with little response. Patient being bagged upon entering ER with patient.

## 2021-08-10 NOTE — ED Notes (Addendum)
Patient with labored respirations, tight wheezing. Increased from 5 L per  to NRB. Oxygen increased from 78% on 5L to 100% on NRB. RT at bedside for BIPAP placement. NG tube removed for BiPap. Patient alert, continued labored respirations.

## 2021-08-10 NOTE — ED Notes (Signed)
14 fr NG tube placed to left nare. Patient with shortness of breath before placement, but with oxygen saturation decrease to 78% after placement. Placement confirmed by auscultation. NRB placed on patient and Dr. Derrill Kay called and arrived promptly to bedside. STAT portable chest and abdomen Xray ordered. Oxygen increased to 100% on NRB. Patient is alert. Son asked multiple times for patient to receive pain medication but was told by physician and nurse that pain medication will increase sedation.

## 2021-08-11 ENCOUNTER — Inpatient Hospital Stay: Payer: Medicare Other

## 2021-08-11 DIAGNOSIS — J9622 Acute and chronic respiratory failure with hypercapnia: Secondary | ICD-10-CM

## 2021-08-11 DIAGNOSIS — J9621 Acute and chronic respiratory failure with hypoxia: Secondary | ICD-10-CM

## 2021-08-11 DIAGNOSIS — K8020 Calculus of gallbladder without cholecystitis without obstruction: Secondary | ICD-10-CM

## 2021-08-11 DIAGNOSIS — R1011 Right upper quadrant pain: Secondary | ICD-10-CM

## 2021-08-11 LAB — MRSA NEXT GEN BY PCR, NASAL: MRSA by PCR Next Gen: NOT DETECTED

## 2021-08-11 LAB — CBG MONITORING, ED
Glucose-Capillary: 100 mg/dL — ABNORMAL HIGH (ref 70–99)
Glucose-Capillary: 104 mg/dL — ABNORMAL HIGH (ref 70–99)
Glucose-Capillary: 133 mg/dL — ABNORMAL HIGH (ref 70–99)
Glucose-Capillary: 139 mg/dL — ABNORMAL HIGH (ref 70–99)
Glucose-Capillary: 167 mg/dL — ABNORMAL HIGH (ref 70–99)
Glucose-Capillary: 171 mg/dL — ABNORMAL HIGH (ref 70–99)
Glucose-Capillary: 58 mg/dL — ABNORMAL LOW (ref 70–99)
Glucose-Capillary: 76 mg/dL (ref 70–99)

## 2021-08-11 LAB — MAGNESIUM: Magnesium: 2.3 mg/dL (ref 1.7–2.4)

## 2021-08-11 LAB — TROPONIN I (HIGH SENSITIVITY)
Troponin I (High Sensitivity): 642 ng/L (ref <18)
Troponin I (High Sensitivity): 544 ng/L (ref <18)

## 2021-08-11 LAB — RESPIRATORY PANEL BY PCR

## 2021-08-11 LAB — COMPREHENSIVE METABOLIC PANEL
ALT: 33 U/L (ref 0–44)
AST: 149 U/L — ABNORMAL HIGH (ref 15–41)
Albumin: 3.2 g/dL — ABNORMAL LOW (ref 3.5–5.0)
Alkaline Phosphatase: 66 U/L (ref 38–126)
Anion gap: 7 (ref 5–15)
BUN: 14 mg/dL (ref 8–23)
CO2: 32 mmol/L (ref 22–32)
Calcium: 8.2 mg/dL — ABNORMAL LOW (ref 8.9–10.3)
Chloride: 98 mmol/L (ref 98–111)
Creatinine, Ser: 0.99 mg/dL (ref 0.44–1.00)
GFR, Estimated: >60 mL/min (ref >60)
Glucose, Bld: 149 mg/dL — ABNORMAL HIGH (ref 70–99)
Potassium: 4.9 mmol/L (ref 3.5–5.1)
Sodium: 137 mmol/L (ref 135–145)
Total Bilirubin: 1 mg/dL (ref 0.3–1.2)
Total Protein: 7.1 g/dL (ref 6.5–8.1)

## 2021-08-11 LAB — BASIC METABOLIC PANEL
Anion gap: 6 (ref 5–15)
BUN: 13 mg/dL (ref 8–23)
CO2: 35 mmol/L — ABNORMAL HIGH (ref 22–32)
Calcium: 8.2 mg/dL — ABNORMAL LOW (ref 8.9–10.3)
Chloride: 96 mmol/L — ABNORMAL LOW (ref 98–111)
Creatinine, Ser: 1.01 mg/dL — ABNORMAL HIGH (ref 0.44–1.00)
GFR, Estimated: >60 mL/min (ref >60)
Glucose, Bld: 63 mg/dL — ABNORMAL LOW (ref 70–99)
Potassium: 5.3 mmol/L — ABNORMAL HIGH (ref 3.5–5.1)
Sodium: 137 mmol/L (ref 135–145)

## 2021-08-11 LAB — RESP PANEL BY RT-PCR (FLU A&B, COVID) ARPGX2
Influenza A by PCR: NEGATIVE
Influenza B by PCR: NEGATIVE
SARS Coronavirus 2 by RT PCR: NEGATIVE

## 2021-08-11 LAB — BLOOD GAS, ARTERIAL
Acid-Base Excess: 8.4 mmol/L — ABNORMAL HIGH (ref 0.0–2.0)
Bicarbonate: 39.4 mmol/L — ABNORMAL HIGH (ref 20.0–28.0)
Delivery systems: POSITIVE
Expiratory PAP: 8
FIO2: 0.5
Inspiratory PAP: 16
O2 Saturation: 97.8 %
Patient temperature: 37
RATE: 12 resp/min
pCO2 arterial: 94 mmHg (ref 32.0–48.0)
pH, Arterial: 7.23 — ABNORMAL LOW (ref 7.350–7.450)
pO2, Arterial: 116 mmHg — ABNORMAL HIGH (ref 83.0–108.0)

## 2021-08-11 LAB — CREATININE, SERUM
Creatinine, Ser: 1.12 mg/dL — ABNORMAL HIGH (ref 0.44–1.00)
GFR, Estimated: 55 mL/min — ABNORMAL LOW (ref >60)

## 2021-08-11 LAB — BLOOD GAS, VENOUS
Acid-Base Excess: 8.6 mmol/L — ABNORMAL HIGH (ref 0.0–2.0)
Acid-Base Excess: 6.6 mmol/L — ABNORMAL HIGH (ref 0.0–2.0)
Bicarbonate: 38.6 mmol/L — ABNORMAL HIGH (ref 20.0–28.0)
Bicarbonate: 35.4 mmol/L — ABNORMAL HIGH (ref 20.0–28.0)
Delivery systems: POSITIVE
Delivery systems: POSITIVE
FIO2: 0.36
FIO2: 0.36
Mechanical Rate: 22
Mechanical Rate: 22
O2 Saturation: 84.2 %
O2 Saturation: 84.7 %
Patient temperature: 37
Patient temperature: 37
RATE: 22 resp/min
RATE: 22 resp/min
pCO2, Ven: 84 mmHg (ref 44.0–60.0)
pCO2, Ven: 72 mmHg (ref 44.0–60.0)
pH, Ven: 7.27 (ref 7.250–7.430)
pH, Ven: 7.3 (ref 7.250–7.430)
pO2, Ven: 56 mmHg — ABNORMAL HIGH (ref 32.0–45.0)
pO2, Ven: 55 mmHg — ABNORMAL HIGH (ref 32.0–45.0)

## 2021-08-11 LAB — CBC
HCT: 38.2 % (ref 36.0–46.0)
Hemoglobin: 11.9 g/dL — ABNORMAL LOW (ref 12.0–15.0)
MCH: 29.1 pg (ref 26.0–34.0)
MCHC: 31.2 g/dL (ref 30.0–36.0)
MCV: 93.4 fL (ref 80.0–100.0)
Platelets: 120 10*3/uL — ABNORMAL LOW (ref 150–400)
RBC: 4.09 MIL/uL (ref 3.87–5.11)
RDW: 13.1 % (ref 11.5–15.5)
WBC: 4.7 10*3/uL (ref 4.0–10.5)
nRBC: 0 % (ref 0.0–0.2)

## 2021-08-11 LAB — SALICYLATE LEVEL: Salicylate Lvl: <7 mg/dL — ABNORMAL LOW (ref 7.0–30.0)

## 2021-08-11 LAB — HIV ANTIBODY (ROUTINE TESTING W REFLEX)
HIV Screen 4th Generation wRfx: NONREACTIVE
HIV Screen 4th Generation wRfx: NONREACTIVE

## 2021-08-11 LAB — GLUCOSE, CAPILLARY: Glucose-Capillary: 135 mg/dL — ABNORMAL HIGH (ref 70–99)

## 2021-08-11 LAB — PHOSPHORUS: Phosphorus: 4.8 mg/dL — ABNORMAL HIGH (ref 2.5–4.6)

## 2021-08-11 LAB — LACTIC ACID, PLASMA
Lactic Acid, Venous: 0.9 mmol/L (ref 0.5–1.9)
Lactic Acid, Venous: 0.7 mmol/L (ref 0.5–1.9)

## 2021-08-11 LAB — PROCALCITONIN
Procalcitonin: 1.01 ng/mL
Procalcitonin: 1.65 ng/mL

## 2021-08-11 LAB — AMMONIA: Ammonia: 20 umol/L (ref 9–35)

## 2021-08-11 LAB — ACETAMINOPHEN LEVEL: Acetaminophen (Tylenol), Serum: <10 ug/mL — ABNORMAL LOW (ref 10–30)

## 2021-08-11 LAB — BRAIN NATRIURETIC PEPTIDE: B Natriuretic Peptide: 358.7 pg/mL — ABNORMAL HIGH (ref 0.0–100.0)

## 2021-08-11 MED ORDER — MORPHINE SULFATE (PF) 2 MG/ML IV SOLN
1.0000 mg | Freq: Four times a day (QID) | INTRAVENOUS | Status: DC | PRN
  Administered 2021-08-11 – 2021-08-13 (×4): 1 mg via INTRAVENOUS
  Filled 2021-08-11 (×4): qty 1

## 2021-08-11 MED ORDER — IPRATROPIUM-ALBUTEROL 0.5-2.5 (3) MG/3ML IN SOLN
3.0000 mL | Freq: Once | RESPIRATORY_TRACT | Status: AC
  Administered 2021-08-11: 3 mL via RESPIRATORY_TRACT

## 2021-08-11 MED ORDER — PANTOPRAZOLE SODIUM 40 MG IV SOLR
40.0000 mg | INTRAVENOUS | Status: DC
  Administered 2021-08-11 – 2021-08-13 (×3): 40 mg via INTRAVENOUS
  Filled 2021-08-11 (×3): qty 40

## 2021-08-11 MED ORDER — DEXTROSE 50 % IV SOLN: 12.5000 g | Freq: Once | INTRAVENOUS | Status: AC

## 2021-08-11 MED ORDER — PREDNISONE 20 MG PO TABS: 40.0000 mg | ORAL_TABLET | Freq: Every day | ORAL | Status: DC

## 2021-08-11 MED ORDER — DEXTROSE 50 % IV SOLN
INTRAVENOUS | Status: AC
  Administered 2021-08-11: 12.5 g via INTRAVENOUS
  Filled 2021-08-11: qty 50

## 2021-08-11 MED ORDER — IPRATROPIUM-ALBUTEROL 0.5-2.5 (3) MG/3ML IN SOLN
3.0000 mL | Freq: Four times a day (QID) | RESPIRATORY_TRACT | Status: DC
  Administered 2021-08-11 – 2021-08-13 (×10): 3 mL via RESPIRATORY_TRACT
  Filled 2021-08-11 (×10): qty 3

## 2021-08-11 MED ORDER — METHYLPREDNISOLONE SODIUM SUCC 40 MG IJ SOLR
40.0000 mg | Freq: Two times a day (BID) | INTRAMUSCULAR | Status: DC
  Administered 2021-08-11: 40 mg via INTRAVENOUS
  Filled 2021-08-11: qty 1

## 2021-08-11 MED ORDER — ALBUTEROL SULFATE (2.5 MG/3ML) 0.083% IN NEBU: 2.5000 mg | INHALATION_SOLUTION | RESPIRATORY_TRACT | Status: DC | PRN

## 2021-08-11 MED ORDER — BUDESONIDE 0.25 MG/2ML IN SUSP
0.2500 mg | Freq: Two times a day (BID) | RESPIRATORY_TRACT | Status: DC
  Administered 2021-08-11 – 2021-08-13 (×5): 0.25 mg via RESPIRATORY_TRACT
  Filled 2021-08-11 (×5): qty 2

## 2021-08-11 MED ORDER — METHYLPREDNISOLONE SODIUM SUCC 125 MG IJ SOLR
80.0000 mg | INTRAMUSCULAR | Status: AC
  Administered 2021-08-11: 80 mg via INTRAVENOUS
  Filled 2021-08-11: qty 2

## 2021-08-11 MED ORDER — IPRATROPIUM-ALBUTEROL 0.5-2.5 (3) MG/3ML IN SOLN
3.0000 mL | Freq: Once | RESPIRATORY_TRACT | Status: AC
  Administered 2021-08-11: 3 mL via RESPIRATORY_TRACT
  Filled 2021-08-11: qty 3

## 2021-08-11 MED ORDER — METHYLPREDNISOLONE SODIUM SUCC 125 MG IJ SOLR
60.0000 mg | Freq: Two times a day (BID) | INTRAMUSCULAR | Status: DC
  Administered 2021-08-11 – 2021-08-13 (×5): 60 mg via INTRAVENOUS
  Filled 2021-08-11 (×6): qty 2

## 2021-08-11 MED ORDER — MAGNESIUM SULFATE IN D5W 1-5 GM/100ML-% IV SOLN
1.0000 g | Freq: Once | INTRAVENOUS | Status: AC
  Administered 2021-08-11: 1 g via INTRAVENOUS
  Filled 2021-08-11: qty 100

## 2021-08-11 NOTE — Progress Notes (Addendum)
NAME:  Bethany Villa, MRN:  [id number], DOB:  13-Oct-1955, LOS: 1 ADMISSION DATE:  08/10/2021, CONSULTATION DATE:  08/11/21 REFERRING MD:  Dr. Damita Dunnings, CHIEF COMPLAINT:   Unresponsiveness  History of Present Illness:  (History provided per chart review and documentation as patient does not remember events prior to hospital arrival ) 65 yo F presenting to [facility] ED from home via EMS on 08/10/21 after being found by family unresponsive.  Upon EMS arrival patient was having difficult time breathing on her own, a total of 3 mg of Narcan were administered in the field with some minimal response.  Patient arrived in ED being actively bagged, no loss of pulses. Per ED documentation EMS reported that family was concerned that patient has used other peoples pain patches in the past. ED course: Patient responsive only to painful stimuli on arrival.  Due to history of opioid use additional Narcan administered with minimal improvement.  While work-up was being completed the patient's mental status significantly improved to the point where she was awake and alert.  Patient's breathing then became more labored with expiratory wheezing and she was transitioned from nasal cannula to nonrebreather and eventually to BiPAP.   Medications given: Cefepime/vancomycin and Zosyn, DuoNeb's x2, 2 mg Narcan, 40 mg Solu-Medrol, 1 L LR bolus, 10 units insulin and an amp of D50 Initial Vitals: RR 17, tachycardic at 112, BP 118/73 and SPO2 100% on nonrebreather Significant labs: (Labs/ Imaging personally reviewed) I, Domingo Pulse [place], AGACNP-BC, personally viewed and interpreted this ECG. EKG Interpretation: Date: 08/10/2021, EKG Time: 18:20, Rate: 114, Rhythm: ST, QRS Axis: RAD, Intervals: Prolonged QTC, RBBB, ST/T Wave abnormalities: none, Narrative Interpretation: ST with RBBB Chemistry: Na+:137, K+: 5.9 > 5.3, BUN/Cr.: 10/1.20>1.01, Serum CO2/ AG: 37/ 7 Hematology: WBC: 12.3 > 14.6, Hgb: 14.2,  Lactic/ PCT: 0.9>0.7/  1.01> 1.65 ABG: 7.24/90/61/38.6 >> 7.23/ 94/116/39.4  CXR 08/10/21: Patchy opacities in the upper lobes, L > R concerning for Pna CT head wo contrast 08/10/21: no acute abnormality  TRH consulted for admission, PCCM consulted to assist in BIPAP management.  Pertinent  Medical History  Anxiety/depression Asthma HFrEF Chronic back pain HLD DJD COPD on 2 L chronic O2 GERD  Significant Hospital Events: Including procedures, antibiotic start and stop dates in addition to other pertinent events   08/10/21: Admitted with acute on chronic hypoxic/hypercarbic respiratory failure requiring BIPAP, PCCM consulted  Interim History / Subjective:  Patient alert and responsive appearing comfortable on BiPAP, intermittently confused, able to follow commands.  Trialed off BiPAP, patient appears to have very little reserve with immediate increase in work of breathing and shortness of breath.  Objective   Blood pressure 97/82, pulse 94, resp. rate 20, weight 81.6 kg, SpO2 98 %.    FiO2 (%):  [50 %] 50 %   Intake/Output Summary (Last 24 hours) at 08/11/2021 0146 Last data filed at 08/11/2021 0046 Gross per 24 hour  Intake 1339.67 ml  Output --  Net 1339.67 ml   Filed Weights   08/11/21 0125  Weight: 81.6 kg    Examination: General: Adult female, acutely ill, lying in bed, on BIPAP, NAD HEENT: MM pink/moist, anicteric, atraumatic, neck supple Neuro: A&O x 2-3, able to follow commands, PERRL +3, MAE CV: s1s2 RRR, NSR on monitor, no r/m/g Pulm: Regular, non labored on BIPAP @ 36%, breath sounds diminished with expiratory wheezing-BUL & diminished-BLL GI: soft, rounded, tenderness in RUQ, bs x 4 Skin: dry skin, scattered ecchymosis Extremities: warm/dry, pulses + 2 R/P, trace edema  noted  Resolved Hospital Problem list     Assessment & Plan:  Acute on chronic Hypoxic & Hypercapnic Respiratory Failure secondary to suspected Aspiration Pneumonia in the setting of COPD exacerbation PMHx:  COPD (chronic 2L [place]), chronic pain & opioid use, HFrEF, tobacco abuse - Continue BIPAP overnight, wean FiO2 as tolerated > settings adjusted to AVAPS mode for 400 TV/ EPAP 10 - Supplemental O2 to maintain SpO2 > 90% - Intermittent chest x-ray & ABG PRN - Ensure adequate pulmonary hygiene  - F/u cultures, trend PCT - f/u COVID, flu & Resp panel - Continue CAP/Aspiration Pna coverage: Zosyn - budesonide nebs BID, continuous Duo-neb (Q 1 h x 3) > then Duo nebs Q 6, bronchodilators PRN - smoking cessation counseling provided - Because pt has no reserve additional solumedrol 80 mg x 1 as supplement to 40 mg already given for a total of 120 mg. Increased solumedrol dosing to 60 mg BID > consider taper in 24-48 hours  Hyperkalemia- improving: 5.9 > 5.3 Acute Kidney Injury  Baseline Cr: 0.80, Cr on admission: 1.20 Hyperkalemia treated with 10 units insulin SQ & D50 - Strict I/O's: alert provider if UOP < 0.5 mL/kg/hr - gentle IVF hydration  - Daily BMP, replace electrolytes PRN - Avoid nephrotoxic agents as able, ensure adequate renal perfusion  Mild Transaminitis : AST- 72 Hyperammonemia: 136 - Trend hepatic function - F/u RUQ [place] - continue lactulose as able - avoid hepatotoxic agents  Acute Encephalopathy multifocal  Chronic Back Pain  Anxiety/Depression CTH: negative, UDS: negative - supportive care - minimize sedating medications - follow VBG & ammonia closely  HFrEF - f/u BNP - outpatient lasix on hold due to AKI and borderline BP - continuous cardiac monitoring  Best Practice (right click and "Reselect all SmartList Selections" daily)  Diet/type: NPO DVT prophylaxis: LMWH GI prophylaxis: PPI Lines: N/A Foley:  N/A Code Status:  full code Last date of multidisciplinary goals of care discussion [08/11/21]  Labs   CBC: Recent Labs  Lab 08/10/21 1821 08/10/21 2235  WBC 12.3* 14.6*  NEUTROABS 9.3*  --   HGB 14.2 13.1  HCT 48.1* 43.8  MCV 98.8 98.0  PLT 252 166     Basic Metabolic Panel: Recent Labs  Lab 08/10/21 1821 08/10/21 2235  NA 137  --   K 5.9*  --   CL 93*  --   CO2 37*  --   GLUCOSE 191*  --   BUN 10  --   CREATININE 1.20* 1.12*  CALCIUM 9.0  --    GFR: Estimated Creatinine Clearance: 52.5 mL/min (A) (by C-G formula based on SCr of 1.12 mg/dL (H)). Recent Labs  Lab 08/10/21 1821 08/10/21 2203 08/10/21 2235 08/10/21 2314  PROCALCITON  --  1.01  --   --   WBC 12.3*  --  14.6*  --   LATICACIDVEN  --   --   --  0.9    Liver Function Tests: Recent Labs  Lab 08/10/21 1821  AST 72*  ALT 32  ALKPHOS 96  BILITOT 0.5  PROT 8.1  ALBUMIN 3.7   No results for input(s): LIPASE, AMYLASE in the last 168 hours. Recent Labs  Lab 08/10/21 1821  AMMONIA 136*    ABG    Component Value Date/Time   PHART 7.23 (L) 08/11/2021 0051   PCO2ART 94 (HH) 08/11/2021 0051   PO2ART 116 (H) 08/11/2021 0051   HCO3 39.4 (H) 08/11/2021 0051   TCO2 27.9 09/05/2015 2125   ACIDBASEDEF 0.7  09/04/2015 1455   O2SAT 97.8 08/11/2021 0051     Coagulation Profile: No results for input(s): INR, PROTIME in the last 168 hours.  Cardiac Enzymes: No results for input(s): CKTOTAL, CKMB, CKMBINDEX, TROPONINI in the last 168 hours.  HbA1C: Hgb A1c MFr Bld  Date/Time Value Ref Range Status  06/11/2019 03:50 AM 5.4 4.8 - 5.6 % Final    Comment:    (NOTE) Pre diabetes:          5.7%-6.4% Diabetes:              >6.4% Glycemic control for   <7.0% adults with diabetes   05/19/2011 05:42 PM 6.0 (H) <5.7 % Final    Comment:    (NOTE)                                                                       According to the ADA Clinical Practice Recommendations for 2011, when HbA1c is used as a screening test:  >=6.5%   Diagnostic of Diabetes Mellitus           (if abnormal result is confirmed) 5.7-6.4%   Increased risk of developing Diabetes Mellitus References:Diagnosis and Classification of Diabetes Mellitus,Diabetes Care,2011,34(Suppl  1):S62-S69 and Standards of Medical Care in         Diabetes - 2011,Diabetes Care,2011,34 (Suppl 1):S11-S61.    CBG: Recent Labs  Lab 08/10/21 2343 08/11/21 0009 08/11/21 0045 08/11/21 0127  GLUCAP 79 167* 76 58*    Review of Systems: Positives in BOLD  Gen: Denies fever, chills, weight change, fatigue, night sweats HEENT: Denies blurred vision, double vision, hearing loss, tinnitus, sinus congestion, rhinorrhea, sore throat, neck stiffness, dysphagia PULM: Denies shortness of breath, cough, sputum production, hemoptysis, wheezing CV: Denies chest pain, edema, orthopnea, paroxysmal nocturnal dyspnea, palpitations GI: Denies abdominal pain, nausea, vomiting, diarrhea, hematochezia, melena, constipation, change in bowel habits GU: Denies dysuria, hematuria, polyuria, oliguria, urethral discharge Endocrine: Denies hot or cold intolerance, polyuria, polyphagia or appetite change Derm: Denies rash, dry skin, scaling or peeling skin change Heme: Denies easy bruising, bleeding, bleeding gums Neuro: Denies headache, numbness, weakness, slurred speech, loss of memory or consciousness  Past Medical History:  She,  has a past medical history of Anxiety, Asthma, Chronic pain, Chronic systolic CHF (congestive heart failure) (HCC) (09/22/2019), COPD (chronic obstructive pulmonary disease) (HCC), Depression, DJD (degenerative joint disease), Fibromyalgia, GERD (gastroesophageal reflux disease), Headache, Hyperlipidemia, Lower extremity edema, On home oxygen therapy, Panic attack, Shortness of breath dyspnea, and Sleep disorder.   Surgical History:   Past Surgical History:  Procedure Laterality Date   ABDOMINAL SURGERY     ANKLE SURGERY     BACK SURGERY     lower   CARPAL TUNNEL RELEASE     CESAREAN SECTION     I & D KNEE WITH POLY EXCHANGE N/A 06/11/2020   Procedure: IRRIGATION AND DEBRIDEMENT KNEE WITH POLY EXCHANGE;  Surgeon: Christena Flake, MD;  Location: [facility] ORS;  Service: Orthopedics;   Laterality: N/A;   KNEE SURGERY     TONSILLECTOMY     TOTAL KNEE ARTHROPLASTY Left 04/14/2016   Procedure: TOTAL KNEE ARTHROPLASTY;  Surgeon: Deeann Saint, MD;  Location: [facility] ORS;  Service: Orthopedics;  Laterality: Left;   TUBAL LIGATION  Social History:   reports that she has been smoking cigarettes. She has a 45.00 pack-year smoking history. She has never used smokeless tobacco. She reports current alcohol use. She reports that she does not use drugs.   Family History:  Her family history includes Cancer in her mother.   Allergies Allergies  Allergen Reactions   Gabapentin Shortness Of Breath and Rash   Mobic [Meloxicam] Anaphylaxis   Penicillins Shortness Of Breath and Swelling    Has patient had a PCN reaction causing immediate rash, facial/tongue/throat swelling, SOB or lightheadedness with hypotension: unknown Has patient had a PCN reaction causing severe rash involving mucus membranes or skin necrosis: {unknown Has patient had a PCN reaction that required hospitalization {unknown Has patient had a PCN reaction occurring within the last 10 years: no If all of the above answers are "NO", then may proceed with Cephalosporin use.   Sulfa Antibiotics Shortness Of Breath and Swelling     Home Medications  Prior to Admission medications   Medication Sig Start Date End Date Taking? Authorizing Provider  albuterol (VENTOLIN HFA) 108 (90 Base) MCG/ACT inhaler INHALE 2 PUFFS BY MOUTH EVERY 6 HOURS AS NEEDED FOR WHEEZE 10/20/18   [provider]  atorvastatin (LIPITOR) 20 MG tablet TAKE 1 TABLET (20 MG TOTAL) BY MOUTH DAILY. Patient not taking: Reported on 06/02/2021 04/29/16   Chevis Pretty, FNP  buprenorphine Haze Rushing) 7.5 MCG/HR Place 1 patch onto the skin once a week. For chronic pain syndrome 06/02/21 08/25/21  Gillis Santa, MD  busPIRone (BUSPAR) 5 MG tablet Take 5 mg by mouth 3 (three) times daily.  Patient not taking: Reported on 06/02/2021    [provider]  enoxaparin (LOVENOX) 40 MG/0.4ML injection Inject 0.4 mLs (40 mg total) into the skin daily for 14 doses. 06/13/20 06/27/20  Reche Dixon, PA-C  feeding supplement (ENSURE ENLIVE / ENSURE PLUS) LIQD Take 237 mLs by mouth 2 (two) times daily between meals. Patient not taking: Reported on 06/02/2021 06/17/20   Enzo Bi, MD  Fluticasone-Umeclidin-Vilant (TRELEGY ELLIPTA) 100-62.5-25 MCG/INH AEPB Inhale 1 puff into the lungs daily. Patient not taking: Reported on 06/02/2021 07/11/19   [provider]  furosemide (LASIX) 40 MG tablet Take 40 mg by mouth daily as needed.  07/11/19   [provider]  ipratropium-albuterol (DUONEB) 0.5-2.5 (3) MG/3ML SOLN Take 3 mLs by nebulization every 6 (six) hours as needed. 06/17/20   Enzo Bi, MD  Multiple Vitamin (MULTIVITAMIN WITH MINERALS) TABS tablet Take 1 tablet by mouth daily. Patient not taking: Reported on 06/02/2021 06/17/20   Enzo Bi, MD  ondansetron (ZOFRAN) 4 MG tablet Take 4 mg by mouth as needed for nausea/vomiting. Patient not taking: Reported on 06/02/2021 09/04/19   [provider]  oxyCODONE (OXY IR/ROXICODONE) 5 MG immediate release tablet Take 1 tablet (5 mg total) by mouth every 6 (six) hours as needed for moderate pain (pain score 4-6). Patient not taking: Reported on 06/02/2021 06/17/20   Enzo Bi, MD  polyethylene glycol (MIRALAX / GLYCOLAX) 17 g packet Take 34 g by mouth 2 (two) times daily. Patient not taking: Reported on 06/02/2021 06/17/20   Enzo Bi, MD  sertraline (ZOLOFT) 50 MG tablet Take 2 tablets (100 mg total) by mouth daily. Patient not taking: Reported on 06/02/2021 06/17/20   Enzo Bi, MD  topiramate (TOPAMAX) 25 MG tablet Take 25 mg by mouth 2 (two) times daily. Patient not taking: Reported on 06/02/2021 08/02/19   [provider]  traZODone (DESYREL) 50 MG  tablet Take 1 tablet (50 mg total) by mouth at bedtime as needed for sleep. 06/17/20   Enzo Bi, MD     Critical care  time: 31 minutes     Venetia Night, AGACNP-BC Acute Care Nurse Practitioner [facility]   [phone] / [phone] Please see Amion for pager details.   ICU ATTENDING ATTESTATION:  Patient seen and examined and relevant ancillary tests reviewed.   I agree with the assessment and plan of care as outlined by Domingo Pulse NP.  This patient was not seen as a shared visit. The following reflects my independent critical care time.  I  personally  reviewed database in its entirety and discussed care plan in detail. In addition, this patient was discussed on multidisciplinary rounds.   I agree with assessment and plan.    BP (!) 144/93 (BP Location: Left Arm)   Pulse 85   Temp 98.3 F (36.8 C)   Resp 17   Ht 5\' 4"  (1.626 m)   Wt 81.6 kg   SpO2 92%   BMI 30.90 kg/m   CBC    Component Value Date/Time   WBC 4.7 08/11/2021 1444   RBC 4.09 08/11/2021 1444   HGB 11.9 (L) 08/11/2021 1444   HGB 13.9 10/22/2013 1509   HCT 38.2 08/11/2021 1444   HCT 44.2 10/22/2013 1509   PLT 120 (L) 08/11/2021 1444   PLT 193 10/22/2013 1509   MCV 93.4 08/11/2021 1444   MCV 89 10/22/2013 1509   MCH 29.1 08/11/2021 1444   MCHC 31.2 08/11/2021 1444   RDW 13.1 08/11/2021 1444   RDW 17.5 (H) 10/22/2013 1509   LYMPHSABS 1.9 08/10/2021 1821   MONOABS 0.5 08/10/2021 1821   EOSABS 0.2 08/10/2021 1821   BASOSABS 0.1 08/10/2021 1821     BMP Latest Ref Rng & Units 08/11/2021 08/11/2021 08/10/2021  Glucose 70 - 99 mg/dL 149(H) 63(L) -  BUN 8 - 23 mg/dL 14 13 -  Creatinine 0.44 - 1.00 mg/dL 0.99 1.01(H) 1.12(H)  BUN/Creat Ratio 9 - 23 - - -  Sodium 135 - 145 mmol/L 137 137 -  Potassium 3.5 - 5.1 mmol/L 4.9 5.3(H) -  Chloride 98 - 111 mmol/L 98 96(L) -  CO2 22 - 32 mmol/L 32 35(H) -  Calcium 8.9 - 10.3 mg/dL 8.2(L) 8.2(L) -        Corrin Parker, M.D.  [facility]  Medical Director [place] Director [place] Department

## 2021-08-11 NOTE — ED Notes (Signed)
Pt removed bipap and was yelling at this RN when this RN went into room to replace bipap, pt yelling to give her water, this RN explained pt needs to keep bipap on and this RN cannot give them anything to drink d/t pt having pneumonia already and contraindication of fluids with bipap. Pt yelling for this RN to "squirt water up my nose then! It's dry and I need damn water in my damn nose". This RN explained how this RN could not put water up pt's nose for same reasoning. This RN replaced bipap mask, ensured adequate seal. Call bell and side table within reach.

## 2021-08-11 NOTE — ED Notes (Signed)
Respiratory called at this time to request they assess this pt's bipap mask and nose gel piece to ensure adequate fit and comfort d/t this pt calling out again and having taken off mask yelling that it is uncomfortable and they want water to either drink or squirt up their nose. This RN again replaced mask and again explained to this pt why squirting water up their nose or allowing them to drink water was contraindicated.

## 2021-08-11 NOTE — Progress Notes (Signed)
PROGRESS NOTE    Bethany Villa  BJS:[id number] DOB: Dec 07, 1955 DOA: 08/10/2021 PCP: [name], MD   Brief Narrative: Taken from H&P. Bethany Villa is a 65 y.o. female with medical history significant for COPD, chronic hypoxic and hypercapnic respiratory failure on home O2 at 2 L, previously a candidate for home trilogy machine, systolic CHF (EF 40 to 45% 05/2019), chronic neck and back pain and fibromyalgia on chronic opiates, who presents by EMS due to being found unresponsive. Patient had an history of hospitalization in February 2021 for unresponsiveness and believed to be secondary to noncompliance with home oxygen with combination of buprenorphine patch. Patient received Narcan twice with minimal response. ABG with hypoxia and hypercapnia, she was placed on BiPAP with improvement in mental status. Labs pertinent for mild leukocytosis, hyperkalemia with potassium of 5.9, AKI and ammonia of 136.  COVID-19 and flu PCR negative.  UA and UDS without any significant abnormality. Chest x-ray with heterogeneous opacities concerning for infection.  CT head without any acute abnormality. Right upper quadrant ultrasound with concern of liver cirrhosis and cholelithiasis with no cholecystitis.  Subjective: Patient was seen and examined today.  She was alert and oriented x3, unable to explain why she was brought to hospital.  Denies any presyncope symptoms.  Per patient she was having diarrhea few days ago which has been now resolved.  No nausea or vomiting.  No sick contacts or recent upper respiratory illnesses.  She was complaining of right upper quadrant pain.  Assessment & Plan:   Principal Problem:   Acute metabolic encephalopathy Active Problems:   COPD (chronic obstructive pulmonary disease) (HCC)   Depression   Acute on chronic respiratory failure with hypercapnia (HCC)   Chronic systolic CHF (congestive heart failure) (HCC)   Altered mental status   Episode of  unresponsiveness   Hyperammonemia (HCC)   Chronic, continuous use of opioids   AKI (acute kidney injury) (HCC)   Hyperkalemia  Acute on chronic hypoxic and hypercapnic respiratory failure/COPD exacerbation.  Initially requiring BiPAP, currently saturating in low 90s on 3 L of oxygen, 2 L of baseline use.  Mental status now at baseline.  Respiratory viral panel, COVID-19 and flu PCR negative. Few scattered wheezes bilaterally. -Continue with supplemental oxygen -BiPAP as needed -Continue with bronchodilators -Continue with steroid  Community-acquired pneumonia.  Per patient she frequently chokes on food.  Swallow evaluation was obtained and found to have no significant risk for aspiration and they were recommending aspiration precautions only.  Procalcitonin elevated at 1.65. -Continue with Zosyn for now as patient is also complaining of right upper quadrant pain.  Right upper quadrant pain.  Ultrasound with cholelithiasis, no acute cholecystitis. -Surgery consult. -Might need HIDA scan -Continue with pain management  Liver cirrhosis.  RUQ ultrasound with concern of liver cirrhosis, per patient no prior diagnosis.  She denies any regular alcohol intake. Ammonia levels were elevated at 136 which has been normalized today.  Mildly elevated AST. -Continue with lactulose.  AKI.  Resolved.  History of HFrEF.  BNP at 358. Prior echo done in 2020 with EF of 40 to 45%.  Appears euvolemic. -Repeat echocardiogram. -Monitor volume status  Hyperkalemia.  Resolved.  Received Lokelma. -Potassium within normal limit now  Depression.  No acute concern. -Continue with home dose of Zoloft  Objective: Vitals:   08/11/21 0745 08/11/21 0825 08/11/21 0845 08/11/21 0854  BP:    92/61  Pulse: 91  89 86  Resp: 20  (!) 23 15  Temp:  TempSrc:      SpO2: 94% 94% 90% 92%  Weight:        Intake/Output Summary (Last 24 hours) at 08/11/2021 1602 Last data filed at 08/11/2021 0515 Gross per 24  hour  Intake 1440.34 ml  Output --  Net 1440.34 ml   Filed Weights   08/11/21 0125  Weight: 81.6 kg    Examination:  General exam: Appears calm and comfortable  Respiratory system: Few scattered wheezes bilaterally. Respiratory effort normal. Cardiovascular system: S1 & S2 heard, RRR.  Gastrointestinal system: Soft, nontender, nondistended, bowel sounds positive. Central nervous system: Alert and oriented. No focal neurological deficits. Extremities: No edema, no cyanosis, pulses intact and symmetrical. Psychiatry: Judgement and insight appear normal.   DVT prophylaxis: Lovenox Code Status: Full Family Communication:  Disposition Plan:  Status is: Inpatient  Remains inpatient appropriate because: Severity of illness   Level of care: Progressive  All the records are reviewed and case discussed with Care Management/Social Worker. Management plans discussed with the patient, nursing and they are in agreement.  Consultants:  General surgery  Procedures:  Antimicrobials:  Zosyn  Data Reviewed: I have personally reviewed following labs and imaging studies  CBC: Recent Labs  Lab 08/10/21 1821 08/10/21 2235 08/11/21 1444  WBC 12.3* 14.6* 4.7  NEUTROABS 9.3*  --   --   HGB 14.2 13.1 11.9*  HCT 48.1* 43.8 38.2  MCV 98.8 98.0 93.4  PLT 252 166 120*   Basic Metabolic Panel: Recent Labs  Lab 08/10/21 1821 08/10/21 2235 08/11/21 0137 08/11/21 0516  NA 137  --  137 137  K 5.9*  --  5.3* 4.9  CL 93*  --  96* 98  CO2 37*  --  35* 32  GLUCOSE 191*  --  63* 149*  BUN 10  --  13 14  CREATININE 1.20* 1.12* 1.01* 0.99  CALCIUM 9.0  --  8.2* 8.2*  MG  --   --   --  2.3  PHOS  --   --   --  4.8*   GFR: Estimated Creatinine Clearance: 59.4 mL/min (by C-G formula based on SCr of 0.99 mg/dL). Liver Function Tests: Recent Labs  Lab 08/10/21 1821 08/11/21 0516  AST 72* 149*  ALT 32 33  ALKPHOS 96 66  BILITOT 0.5 1.0  PROT 8.1 7.1  ALBUMIN 3.7 3.2*   No  results for input(s): LIPASE, AMYLASE in the last 168 hours. Recent Labs  Lab 08/10/21 1821 08/11/21 0516  AMMONIA 136* 20   Coagulation Profile: No results for input(s): INR, PROTIME in the last 168 hours. Cardiac Enzymes: No results for input(s): CKTOTAL, CKMB, CKMBINDEX, TROPONINI in the last 168 hours. BNP (last 3 results) No results for input(s): PROBNP in the last 8760 hours. HbA1C: No results for input(s): HGBA1C in the last 72 hours. CBG: Recent Labs  Lab 08/11/21 0127 08/11/21 0219 08/11/21 0322 08/11/21 0811 08/11/21 1128  GLUCAP 58* 100* 104* 133* 139*   Lipid Profile: No results for input(s): CHOL, HDL, LDLCALC, TRIG, CHOLHDL, LDLDIRECT in the last 72 hours. Thyroid Function Tests: No results for input(s): TSH, T4TOTAL, FREET4, T3FREE, THYROIDAB in the last 72 hours. Anemia Panel: No results for input(s): VITAMINB12, FOLATE, FERRITIN, TIBC, IRON, RETICCTPCT in the last 72 hours. Sepsis Labs: Recent Labs  Lab 08/10/21 2203 08/10/21 2314 08/11/21 0134 08/11/21 0137  PROCALCITON 1.01  --   --  1.65  LATICACIDVEN  --  0.9 0.7  --     Recent Results (from  the past 240 hour(s))  Culture, blood (routine x 2) Call MD if unable to obtain prior to antibiotics being given     Status: None (Preliminary result)   Collection Time: 08/10/21 10:35 PM   Specimen: BLOOD LEFT FOREARM  Result Value Ref Range Status   Specimen Description BLOOD LEFT FOREARM  Final   Special Requests   Final    BOTTLES DRAWN AEROBIC AND ANAEROBIC Blood Culture results may not be optimal due to an inadequate volume of blood received in culture bottles   Culture   Final    NO GROWTH < 12 HOURS Performed at [facility], [address], [place], [place] [address]    Report Status PENDING  Incomplete  Culture, blood (routine x 2) Call MD if unable to obtain prior to antibiotics being given     Status: None (Preliminary result)   Collection Time: 08/10/21 10:40 PM   Specimen: BLOOD  LEFT FOREARM  Result Value Ref Range Status   Specimen Description BLOOD LEFT FOREARM  Final   Special Requests   Final    BOTTLES DRAWN AEROBIC AND ANAEROBIC Blood Culture results may not be optimal due to an inadequate volume of blood received in culture bottles   Culture   Final    NO GROWTH < 12 HOURS Performed at [facility], [address] [place], [place] [address]    Report Status PENDING  Incomplete  Resp Panel by RT-PCR (Flu A&B, Covid) Nasopharyngeal Swab     Status: None   Collection Time: 08/11/21  5:29 AM   Specimen: Nasopharyngeal Swab; Nasopharyngeal(NP) swabs in vial transport medium  Result Value Ref Range Status   SARS Coronavirus 2 by RT PCR NEGATIVE NEGATIVE Final    Comment: (NOTE) SARS-CoV-2 target nucleic acids are NOT DETECTED.  The SARS-CoV-2 RNA is generally detectable in upper respiratory specimens during the acute phase of infection. The lowest concentration of SARS-CoV-2 viral copies this assay can detect is 138 copies/mL. A negative result does not preclude SARS-Cov-2 infection and should not be used as the sole basis for treatment or other patient management decisions. A negative result may occur with  improper specimen collection/handling, submission of specimen other than nasopharyngeal swab, presence of viral mutation(s) within the areas targeted by this assay, and inadequate number of viral copies(<138 copies/mL). A negative result must be combined with clinical observations, patient history, and epidemiological information. The expected result is Negative.  Fact Sheet for Patients:  BloggerCourse.com  Fact Sheet for Healthcare Providers:  SeriousBroker.it  This test is no t yet approved or cleared by the [place] FDA and  has been authorized for detection and/or diagnosis of SARS-CoV-2 by FDA under an Emergency Use Authorization (EUA). This EUA will remain  in effect (meaning  this test can be used) for the duration of the COVID-19 declaration under Section 564(b)(1) of the Act, 21 U.S.C.section 360bbb-3(b)(1), unless the authorization is terminated  or revoked sooner.       Influenza A by PCR NEGATIVE NEGATIVE Final   Influenza B by PCR NEGATIVE NEGATIVE Final    Comment: (NOTE) The Xpert Xpress SARS-CoV-2/FLU/RSV plus assay is intended as an aid in the diagnosis of influenza from Nasopharyngeal swab specimens and should not be used as a sole basis for treatment. Nasal washings and aspirates are unacceptable for Xpert Xpress SARS-CoV-2/FLU/RSV testing.  Fact Sheet for Patients: BloggerCourse.com  Fact Sheet for Healthcare Providers: SeriousBroker.it  This test is not yet approved or cleared by the [place]  [place] FDA and has been authorized for detection and/or diagnosis of SARS-CoV-2 by FDA under an Emergency Use Authorization (EUA). This EUA will remain in effect (meaning this test can be used) for the duration of the COVID-19 declaration under Section 564(b)(1) of the Act, 21 U.S.C. section 360bbb-3(b)(1), unless the authorization is terminated or revoked.  Performed at [facility], [address] [place], [place] [address]   Respiratory (~20 pathogens) panel by PCR     Status: None   Collection Time: 08/11/21  5:29 AM   Specimen: Nasopharyngeal Swab; Respiratory  Result Value Ref Range Status   Adenovirus NOT DETECTED NOT DETECTED Final   Coronavirus 229E NOT DETECTED NOT DETECTED Final    Comment: (NOTE) The Coronavirus on the Respiratory Panel, DOES NOT test for the novel  Coronavirus (2019 nCoV)    Coronavirus HKU1 NOT DETECTED NOT DETECTED Final   Coronavirus NL63 NOT DETECTED NOT DETECTED Final   Coronavirus OC43 NOT DETECTED NOT DETECTED Final   Metapneumovirus NOT DETECTED NOT DETECTED Final   Rhinovirus / Enterovirus NOT DETECTED NOT DETECTED Final   Influenza A NOT  DETECTED NOT DETECTED Final   Influenza B NOT DETECTED NOT DETECTED Final   Parainfluenza Virus 1 NOT DETECTED NOT DETECTED Final   Parainfluenza Virus 2 NOT DETECTED NOT DETECTED Final   Parainfluenza Virus 3 NOT DETECTED NOT DETECTED Final   Parainfluenza Virus 4 NOT DETECTED NOT DETECTED Final   Respiratory Syncytial Virus NOT DETECTED NOT DETECTED Final   Bordetella pertussis NOT DETECTED NOT DETECTED Final   Bordetella Parapertussis NOT DETECTED NOT DETECTED Final   Chlamydophila pneumoniae NOT DETECTED NOT DETECTED Final   Mycoplasma pneumoniae NOT DETECTED NOT DETECTED Final    Comment: Performed at [facility], [address], [place], [place] [address]  MRSA Next Gen by PCR, Nasal     Status: None   Collection Time: 08/11/21 12:30 PM   Specimen: Nasal Mucosa; Nasal Swab  Result Value Ref Range Status   MRSA by PCR Next Gen NOT DETECTED NOT DETECTED Final    Comment: (NOTE) The GeneXpert MRSA Assay (FDA approved for NASAL specimens only), is one component of a comprehensive MRSA colonization surveillance program. It is not intended to diagnose MRSA infection nor to guide or monitor treatment for MRSA infections. Test performance is not FDA approved in patients less than 62 years old. Performed at [facility], [address], [place], [place] [address]      Radiology Studies: CT HEAD WO CONTRAST ( )  Result Date: 08/10/2021 CLINICAL DATA:  Mental status change. EXAM: CT HEAD WITHOUT CONTRAST TECHNIQUE: Contiguous axial images were obtained from the base of the skull through the vertex without intravenous contrast. COMPARISON:  Head CT dated October 07, 2019 FINDINGS: Brain: No evidence of acute infarction, hemorrhage, hydrocephalus, extra-axial collection or mass lesion/mass effect. Vascular: No hyperdense vessel or unexpected calcification. Skull: Normal. Negative for fracture or focal lesion. Sinuses/Orbits: No acute finding. Other: None. IMPRESSION: No  acute intracranial abnormality. Electronically Signed   By: Allegra Lai M.D.   On: 08/10/2021 21:11   DG Chest Portable 1 View  Result Date: 08/10/2021 CLINICAL DATA:  Shortness of breath EXAM: PORTABLE CHEST 1 VIEW COMPARISON:  08/10/2021 FINDINGS: NG tube is in the stomach. Heart is normal size. Patchy opacities in the upper lobes, left greater than right. No effusions. No acute bony abnormality. IMPRESSION: Patchy opacities in the upper lobes, left greater than right concerning for pneumonia. Electronically Signed  By: Charlett Nose M.D.   On: 08/10/2021 22:26   DG Chest Portable 1 View  Result Date: 08/10/2021 CLINICAL DATA:  Shortness of breath EXAM: PORTABLE CHEST 1 VIEW COMPARISON:  Chest x-ray dated June 27, 2020 FINDINGS: Cardiac and mediastinal contours are unchanged. Upper lung predominant heterogeneous opacities. No evidence of pleural effusion or pneumothorax. IMPRESSION: Upper lung predominant heterogeneous opacities, concerning for infection. Electronically Signed   By: Allegra Lai M.D.   On: 08/10/2021 19:01   DG Abd Portable 1 View  Result Date: 08/10/2021 CLINICAL DATA:  Shortness of breath EXAM: PORTABLE ABDOMEN - 1 VIEW COMPARISON:  Chest x-ray 06/10/2020, CT 08/27/2011 FINDINGS: Esophageal tube looped over the epigastric area with tip projecting over distal stomach, side-port at the level of GE junction. There may be a small hiatal hernia. IMPRESSION: Esophageal tube is looped upon itself over the epigastric area with the tip projecting over the distal stomach Electronically Signed   By: Jasmine Pang M.D.   On: 08/10/2021 22:28   US Abdomen Limited RUQ (LIVER/GB)  Result Date: 08/11/2021 CLINICAL DATA:  Increased ammonia levels EXAM: ULTRASOUND ABDOMEN LIMITED RIGHT UPPER QUADRANT COMPARISON:  No prior abdominal ultrasound, FINDINGS: Gallbladder: Multiple gallstones, measuring up to 1.8 cm. No wall thickening or pericholecystic fluid. Negative Murphy sign.  Common bile duct: Diameter: 4 mm Liver: Mild nodularity of the liver borders. No focal lesion identified. The liver is normal in echogenicity. Portal vein is patent on color Doppler imaging with normal direction of blood flow towards the liver. Other: Evaluation is somewhat limited as the patient was not able to be put in the decubitus position. IMPRESSION: 1. Cholelithiasis without evidence of cholecystitis. 2. Mild hepatic nodularity, as can be seen with cirrhosis. Normal liver echogenicity. No focal lesion. Electronically Signed   By: Wiliam Ke M.D.   On: 08/11/2021 03:40    Scheduled Meds:  budesonide (PULMICORT) nebulizer solution  0.25 mg Nebulization BID   enoxaparin (LOVENOX) injection  40 mg Subcutaneous Q24H   ipratropium-albuterol  3 mL Nebulization Q6H   lactulose  30 g Oral TID   methylPREDNISolone (SOLU-MEDROL) injection  60 mg Intravenous Q12H   pantoprazole (PROTONIX) IV  40 mg Intravenous Q24H   sodium zirconium cyclosilicate  10 g Oral Once   Continuous Infusions:  sodium chloride 100 mL/hr at 08/11/21 0801   naLOXone [facility]) adult infusion for OVERDOSE     piperacillin-tazobactam (ZOSYN)  IV 3.375 g (08/11/21 1550)     LOS: 1 day   Time spent: 43 minutes. More than 50% of the time was spent in counseling/coordination of care  Arnetha Courser, MD [facility]  If 7PM-7AM, please contact night-coverage [email or web address]  08/11/2021, 4:02 PM   This record has been created using Conservation officer, historic buildings. Errors have been sought and corrected,but may not always be located. Such creation errors do not reflect on the standard of care.

## 2021-08-11 NOTE — Evaluation (Signed)
Clinical/Bedside Swallow Evaluation Patient Details  Name: Bethany Villa MRN: [id number] Date of Birth: Mar 07, 1956  Today's Date: 08/11/2021 Time: SLP Start Time (ACUTE ONLY): 1025 SLP Stop Time (ACUTE ONLY): 1115 SLP Time Calculation (min) (ACUTE ONLY): 50 min  Past Medical History:  Past Medical History:  Diagnosis Date   Anxiety    Asthma    Chronic pain    lower back   Chronic systolic CHF (congestive heart failure) (HCC) 09/22/2019   COPD (chronic obstructive pulmonary disease) (HCC)    Depression    DJD (degenerative joint disease)    Fibromyalgia    GERD (gastroesophageal reflux disease)    Headache    Hyperlipidemia    Lower extremity edema    On home oxygen therapy    has not been on since 2016   Panic attack    Shortness of breath dyspnea    Sleep disorder    Past Surgical History:  Past Surgical History:  Procedure Laterality Date   ABDOMINAL SURGERY     ANKLE SURGERY     BACK SURGERY     lower   CARPAL TUNNEL RELEASE     CESAREAN SECTION     I & D KNEE WITH POLY EXCHANGE N/A 06/11/2020   Procedure: IRRIGATION AND DEBRIDEMENT KNEE WITH POLY EXCHANGE;  Surgeon: Christena Flake, MD;  Location: [facility] ORS;  Service: Orthopedics;  Laterality: N/A;   KNEE SURGERY     TONSILLECTOMY     TOTAL KNEE ARTHROPLASTY Left 04/14/2016   Procedure: TOTAL KNEE ARTHROPLASTY;  Surgeon: Deeann Saint, MD;  Location: [facility] ORS;  Service: Orthopedics;  Laterality: Left;   TUBAL LIGATION     HPI:  Pt is a 65 y.o. female with medical history significant for COPD, Obesity, chronic hypoxic and hypercapnic respiratory failure (on home O2 at 2 L), systolic CHF, chronic neck and back pain and fibromyalgia on chronic Opiates, who presents by EMS due to being found unresponsive. Pt admitted for managementof acute/chronic respiratory failure with hypercapnia, acute metabolic encephalopathy. Per imaging, noted with cholelithiasis without evidence of cholecystitis.  Pt lives alone at home w/ family  supporting.  She described Baseline pain/discomfort at home w/ decreased mobility -- "I have tried a lot of pain medications"; used to go to the Pain Clinic but felt Patches "weren't working" so stopped going.  Pt on 2 liters of O2 at home (at night only).   HEAD CT: negative.  CXR: Patchy opacities in the upper lobes, left greater than right concerning for pneumonia. Hiatal hernia per exams. Pt is currently on a PPI this admit.    Assessment / Plan / Recommendation  Clinical Impression  Pt appears to present w/ grossly adequate oropharyngeal phase swallow function w/ No oropharyngeal phase dysphagia noted, No neuromuscular deficits noted. Pt consumed po trials w/ No immediate, overt, clinical s/s of aspiration during po trials. Pt appears at reduced risk for aspiration following general aspiration precautions including REST BREAKS to lessen any SOB/WOB w/ the exertion of eating/drinking. Pt does have challenging factors that could impact her oropharyngeal swallowing to include Pain/discomfort(NSG aware), weakness currently, and quick WOB/SOB w/ any exertion including Talking, moving about. These factors can increase risk for aspiration but can be lessen following general aspiration precautions. Pt also has a Hiatal Hernia per Imaging in chart -- unsure if any Baseline Esophageal phase dysmotility; REFLUX issues. This can also increase risk for aspiration of REFLUX material regurgitated.   During po trials, pt consumed all consistencies w/ no overt  coughing, decline in vocal quality, or change in respiratory presentation during/post trials. O2 sats remained 98%. Oral phase appeared grossly [facility] w/ timely bolus management, mastication, and control of bolus propulsion for A-P transfer for swallowing. Oral clearing achieved w/ all trial consistencies -- moistened, soft foods given. OM Exam appeared [facility] w/ no unilateral weakness noted. Speech Clear. Pt fed self w/ setup support.   Recommend a Regular consistency  diet w/ well-Cut meats, moistened foods when pt is able to upgrade her diet to solid foods per MD; Thin liquids -- straw use not recommended currently d/t decilned pulmonary status and the effort of inhalation w/ coordination required when using straws. Recommend general aspiration precautions including Sitting Fully Upright for oral intake, Pills WHOLE in Puree for safer, easier swallowing. Education given on Pills in Puree; REFLUX precautions, food consistencies and easy to eat options; general aspiration precautions to pt and Son in room. NSG to reconsult if any new needs arise. NSG updated, agreed. MD updated. Recommend Dietician f/u for support.  Pt was interested in seeing the Nurse for something for Pain. NSG informed post eval. Son present in room during session.  SLP Visit Diagnosis: Dysphagia, unspecified (R13.10)    Aspiration Risk   (reduced)    Diet Recommendation   Regular consistency diet w/ well-Cut meats, moistened foods when pt is able to upgrade her diet to solid foods per MD; Thin liquids -- straw use not recommended currently d/t decilned pulmonary status and the effort of inhalation w/ coordination required when using straws. Recommend general aspiration precautions including Sitting Fully Upright for oral intake, Rest Breaks during meals to lessen any WOB/SOB from exertion. Choose meals/foods for conservation of energy. REFLUX precautions.  Medication Administration: Whole meds with puree (for ease of swallowing)    Other  Recommendations Recommended Consults:  (Dietician) Oral Care Recommendations: Oral care BID;Oral care before and after PO;Staff/trained caregiver to provide oral care;Patient independent with oral care (Denture care) Other Recommendations:  (n/a)    Recommendations for follow up therapy are one component of a multi-disciplinary discharge planning process, led by the attending physician.  Recommendations may be updated based on patient status, additional  functional criteria and insurance authorization.  Follow up Recommendations No SLP follow up      Assistance Recommended at Discharge None  Functional Status Assessment Patient has had a recent decline in their functional status and demonstrates the ability to make significant improvements in function in a reasonable and predictable amount of time.  Frequency and Duration  (n/a)   (n/a)       Prognosis Prognosis for Safe Diet Advancement: Fair (-Good) Barriers to Reach Goals: Time post onset;Severity of deficits;Behavior      Swallow Study   General Date of Onset: 08/10/21 HPI: Pt is a 65 y.o. female with medical history significant for COPD, Obesity, chronic hypoxic and hypercapnic respiratory failure (on home O2 at 2 L), systolic CHF, chronic neck and back pain and fibromyalgia on chronic Opiates, who presents by EMS due to being found unresponsive. Pt admitted for managementof acute/chronic respiratory failure with hypercapnia, acute metabolic encephalopathy. Per imaging, noted with cholelithiasis without evidence of cholecystitis.  Pt lives alone at home w/ family supporting.  She described Baseline pain/discomfort at home w/ decreased mobility -- "I have tried a lot of pain medications"; used to go to the Pain Clinic but felt Patches "weren't working" so stopped going.  Pt on 2 liters of O2 at home (at night only).  HEAD CT: negative.  CXR: Patchy opacities in the upper lobes, left greater than right concerning for pneumonia. Hiatal hernia per exams. Pt is currently on a PPI this admit. Type of Study: Bedside Swallow Evaluation Previous Swallow Assessment: none Diet Prior to this Study: Thin liquids (clear liquids per MD) Temperature Spikes Noted: No (wbc elevated) Respiratory Status: Nasal cannula (3L) History of Recent Intubation: No Behavior/Cognition: Alert;Cooperative;Pleasant mood;Distractible;Requires cueing (SOB w/ ANY exertion) Oral Cavity Assessment: Dry Oral Care  Completed by SLP: Yes Oral Cavity - Dentition: Dentures, bottom Vision: Functional for self-feeding Self-Feeding Abilities: Able to feed self;Needs assist;Needs set up Patient Positioning: Upright in bed (needed positioning) Baseline Vocal Quality: Normal (SOB w/ exertion of talking/moving about) Volitional Cough: Strong Volitional Swallow: Able to elicit    Oral/Motor/Sensory Function Overall Oral Motor/Sensory Function: Within functional limits   Ice Chips Ice chips: Within functional limits Presentation: Spoon (fed; 3 trials)   Thin Liquid Thin Liquid: Within functional limits Presentation: Cup;Self Fed (~4-5 ozs) Other Comments: no straws    Nectar Thick Nectar Thick Liquid: Not tested   Honey Thick Honey Thick Liquid: Not tested   Puree Puree: Within functional limits Presentation: Self Fed;Spoon (5 trials) Other Comments: applesauce   Solid     Solid: Within functional limits Presentation: Self Fed;Spoon (3 trials) Other Comments: moistened        Jerilynn Som, MS, CCC-SLP Speech Language Pathologist Rehab Services [phone]  Branae Crail 08/11/2021,2:49 PM

## 2021-08-11 NOTE — Progress Notes (Signed)
Initial Nutrition Assessment  DOCUMENTATION CODES:   Obesity unspecified  INTERVENTION:   -RD will follow for diet advancement and add supplements as appropriate -If pt unable to advance to a PO diet, consider initiation of enteral nutrition support:   Initiate Jevity 1.5 @ 20 ml/hr and increase by 10 ml every 4 hours to goal rate of 50 ml/hr.   45 ml Prosource TF BID.  135 ml free water flush every 4 hours    Tube feeding regimen provides 1880 kcal (100% of needs), 99 grams of protein, and 788 ml of H2O.  Total free water: 1722 ml daily  NUTRITION DIAGNOSIS:   Inadequate oral intake related to inability to eat as evidenced by NPO status.  GOAL:   Patient will meet greater than or equal to 90% of their needs  MONITOR:   Diet advancement, Labs, Weight trends, Skin, I & O's  REASON FOR ASSESSMENT:   Consult Assessment of nutrition requirement/status  ASSESSMENT:   65 yo F presenting to [facility] ED from home via EMS on 08/10/21 after being found by family unresponsive.  Upon EMS arrival patient was having difficult time breathing on her own, a total of 3 mg of Narcan were administered in the field with some minimal response.  Patient arrived in ED being actively bagged, no loss of pulses. Per ED documentation EMS reported that family was concerned that patient has used other peoples pain patches in the past  Pt admitted with acute metabolic encephalopathy, acute respiratory failure, and COPD.   Reviewed I/O's: +1.4 L x 24 hours   Pt unavailable at time of visit. RD unable to obtain further nutrition-related history or complete nutrition-focused physical exam at this time.    Pt currently on Bi-pap, but very distractible and often removing it. She had a NGT, however, it was removed secondary to Bi-pap.   Reviewed wt hx; wt has been stable over the past several years.   Medications reviewed and include lactulose, solu-medrol, lokelma, and 0.9% sodium chloride infusion @ 100  ml/hr.   Labs reviewed: CBGS: 100-133.   Diet Order:   Diet Order             Diet NPO time specified Except for: Sips with Meds, Ice Chips  Diet effective now                   EDUCATION NEEDS:   Not appropriate for education at this time  Skin:  Skin Assessment: Reviewed RN Assessment  Last BM:  Unknown  Height:   Ht Readings from Last 1 Encounters:  06/02/21 5\' 4"  (1.626 m)    Weight:   Wt Readings from Last 1 Encounters:  08/11/21 81.6 kg    Ideal Body Weight:  54.5 kg  BMI:  Body mass index is 30.9 kg/m.  Estimated Nutritional Needs:   Kcal:  1700-1900  Protein:  95-110 grams  Fluid:  >1.7 L    08/13/21, RD, LDN, CDCES Registered Dietitian II Certified Diabetes Care and Education Specialist Please refer to [facility] for RD and/or RD on-call/weekend/after hours pager

## 2021-08-11 NOTE — ED Notes (Signed)
This pt again removed bipap and again was yelling at this RN telling them to give this pt some water or squirt water up their nose. This RN again explained why that was contraindicated and could not happen. Replaced bipap mask and fixed nasal piece for comfort.

## 2021-08-11 NOTE — ED Notes (Signed)
Report received from Matthew, RN  

## 2021-08-11 NOTE — ED Notes (Signed)
Informed RN bed assigned 

## 2021-08-11 NOTE — Evaluation (Signed)
Occupational Therapy Evaluation Patient Details Name: Bethany Villa MRN: [id number] DOB: October 12, 1955 Today's Date: 08/11/2021   History of Present Illness 65 y.o. female with medical history significant for COPD, chronic hypoxic and hypercapnic respiratory failure (on home O2 at 2 L), systolic CHF, chronic neck and back pain and fibromyalgia on chronic opiates, who presents by EMS due to being found unresponsive. Pt admitted for managementof acute/chronic respiratory failure with hypercapnia, acute metabolic encephalopathy. Per imaging, noted with cholelithiasis without evidence of cholecystitis.   Clinical Impression   Pt seen for OT evaluation this date. Prior to admission, pt was independent in all ADLs, household management, and functional mobility, living in a 1-story home alone. Pt on 2 liters of O2 at home (at night only). At beginning of session, pt reporting 8/10 abdomen/back pain and declining OOB mobility. Pt provided with encouragement and education on benefits of OOB mobility, however pt continued to declined. Pt performed bed-level ADLs and achieved long sitting position in bed  MOD-I (requiring increased time/effort d/t significant pain). With pt on 3L of O2 via [place], SpO2 >92% throughout session. Pt would benefit from additional skilled OT services to promote optimal return to PLOF. Upon discharge, recommend HHOT services.     Recommendations for follow up therapy are one component of a multi-disciplinary discharge planning process, led by the attending physician.  Recommendations may be updated based on patient status, additional functional criteria and insurance authorization.   Follow Up Recommendations  Home health OT    Assistance Recommended at Discharge PRN  Functional Status Assessment  Patient has had a recent decline in their functional status and demonstrates the ability to make significant improvements in function in a reasonable and predictable amount of time.   Equipment Recommendations  None recommended by OT       Precautions / Restrictions Precautions Precautions: Fall Restrictions Weight Bearing Restrictions: No      Mobility Bed Mobility Overal bed mobility: Modified Independent Bed Mobility: Supine to Sit Rolling: Modified independent (Device/Increase time)   Supine to sit: Modified independent (Device/Increase time)     General bed mobility comments: With use of bedrails, able to move from semi-folwers position to long sitting position in bed without physical assist    Transfers                   General transfer comment: pt declined d/t R UQ/back pain          ADL either performed or assessed with clinical judgement   ADL Overall ADL's : Modified independent                                       General ADL Comments: Independent with bed-level UB ADLs and Mod-indep (increased time/effort) to don/doff socks via figure 4 position in bed d/t abdomen/back pain     Vision Baseline Vision/History: 1 Wears glasses (reading glasses) Ability to See in Adequate Light: 0 Adequate Patient Visual Report: No change from baseline              Pertinent Vitals/Pain Pain Assessment: 0-10 Pain Score: 8  Pain Location: R UQ shooting to back Pain Descriptors / Indicators: Aching;Grimacing;Guarding;Moaning Pain Intervention(s): Limited activity within patient's tolerance;Monitored during session;Repositioned     Hand Dominance Right   Extremity/Trunk Assessment Upper Extremity Assessment Upper Extremity Assessment: Overall WFL for tasks assessed   Lower Extremity Assessment Lower Extremity Assessment: Overall  WFL for tasks assessed       Communication Communication Communication: No difficulties   Cognition Arousal/Alertness: Awake/alert Behavior During Therapy: WFL for tasks assessed/performed;Anxious Overall Cognitive Status: Within Functional Limits for tasks assessed                                                   Home Living Family/patient expects to be discharged to:: Private residence Living Arrangements: Alone Available Help at Discharge: Family;Available PRN/intermittently Type of Home: Apartment Home Access: Level entry     Home Layout: One level     Bathroom Shower/Tub: Tub/shower unit   Bathroom Toilet: Handicapped height     Home Equipment: Agricultural consultant (2 wheels);Grab bars - tub/shower;Shower seat;Grab bars - toilet          Prior Functioning/Environment Prior Level of Function : Independent/Modified Independent             Mobility Comments: Indep without assist device for household mobilization; denies fall history.  Home O2 at night only. ADLs Comments: Indep without assist device for ADLs and household management. Sons assist with driving, community errands/needs.        OT Problem List: Decreased activity tolerance;Pain      OT Treatment/Interventions: Self-care/ADL training;Therapeutic exercise;Therapeutic activities;Patient/family education    OT Goals(Current goals can be found in the care plan section) Acute Rehab OT Goals Patient Stated Goal: to have less pain OT Goal Formulation: With patient Time For Goal Achievement: 08/25/21 Potential to Achieve Goals: Good ADL Goals Pt Will Perform Grooming: with min guard assist;standing Pt Will Perform Lower Body Dressing: with min guard assist;sit to/from stand;with adaptive equipment Pt Will Transfer to Toilet: with min guard assist;ambulating;bedside commode  OT Frequency: Min 2X/week    AM-PAC OT "6 Clicks" Daily Activity     Outcome Measure Help from another person eating meals?: None Help from another person taking care of personal grooming?: None Help from another person toileting, which includes using toliet, bedpan, or urinal?: A Little Help from another person bathing (including washing, rinsing, drying)?: A Little Help from another person to put  on and taking off regular upper body clothing?: None Help from another person to put on and taking off regular lower body clothing?: None 6 Click Score: 22   End of Session Equipment Utilized During Treatment: Oxygen Nurse Communication: Mobility status  Activity Tolerance: Patient limited by pain Patient left: in bed;with call bell/phone within reach  OT Visit Diagnosis: Pain;Muscle weakness (generalized) (M62.81) Pain - Right/Left: Right Pain - part of body:  (abdomen/back)                Time: 0626-9485 OT Time Calculation (min): 13 min Charges:  OT General Charges $OT Visit: 1 Visit OT Evaluation $OT Eval Low Complexity: 1 Low  Matthew Folks, OTR/L ASCOM [phone]

## 2021-08-11 NOTE — Consult Note (Signed)
Date of Consultation:  08/11/2021  Requesting Physician:  Lorella Nimrod, MD  Reason for Consultation:  RUQ pain  History of Present Illness: Bethany Villa is a 65 y.o. female admitted yesterday, brought by EMS after being found unresponsive at home.  She was found to have significant hypercapnea with CO2 of 90 on ABG, pH of 7.24, K of 5.9, Cr 1.2, total bili of 0.5, AST 72, ALT 32, Alk Phos 96.  Lactic acid was normal at 1.2, but her ammonia level was 136.  Today, she's been more awake and responsive and is able to carry a conversation.  She does not remember much of what happened yesterday.  She reports the last thing she remembers is getting to her kitchen trying to get a nebulizer treatment.  She reports she was having some difficulty breathing and hence felt she needed her treatment.  She thinks she may have fallen but is not sure.  Today, as she's been more awake, she has also noticed RUQ pain, with pain in the RUQ while trying to take a deep breath.  She denies having any pain yesterday, but did feel some nausea and had diarrhea.  Today, she denies any nausea but is having the pain.  She denies having any pain in RUQ prior to today and denies any prior episodes.  As part of her other workup, she had CXR showing concern for pneumonia of upper lobes bilaterally and CT head showing no acute intracranial abnormality.  She had a RUQ U/S today given her pain which showed cholelithiasis, but with normal wall thickening and no pericholecystic fluid.  The liver also had some mild nodularity concerning for cirrhosis.  General Surgery has been consulted for evaluation of her RUQ pain in the setting of cholelithiasis.  Past Medical History: Past Medical History:  Diagnosis Date   Anxiety    Asthma    Chronic pain    lower back   Chronic systolic CHF (congestive heart failure) ([place]) 09/22/2019   COPD (chronic obstructive pulmonary disease) (HCC)    Depression    DJD (degenerative joint disease)     Fibromyalgia    GERD (gastroesophageal reflux disease)    Headache    Hyperlipidemia    Lower extremity edema    On home oxygen therapy    has not been on since 2016   Panic attack    Shortness of breath dyspnea    Sleep disorder      Past Surgical History: Past Surgical History:  Procedure Laterality Date   ABDOMINAL SURGERY     ANKLE SURGERY     BACK SURGERY     lower   CARPAL TUNNEL RELEASE     CESAREAN SECTION     I & D KNEE WITH POLY EXCHANGE N/A 06/11/2020   Procedure: IRRIGATION AND DEBRIDEMENT KNEE WITH POLY EXCHANGE;  Surgeon: Corky Mull, MD;  Location: [facility] ORS;  Service: Orthopedics;  Laterality: N/A;   KNEE SURGERY     TONSILLECTOMY     TOTAL KNEE ARTHROPLASTY Left 04/14/2016   Procedure: TOTAL KNEE ARTHROPLASTY;  Surgeon: Earnestine Leys, MD;  Location: [facility] ORS;  Service: Orthopedics;  Laterality: Left;   TUBAL LIGATION      Home Medications: Prior to Admission medications   Medication Sig Start Date End Date Taking? Authorizing Provider  buprenorphine (BUTRANS) 7.5 MCG/HR Place 1 patch onto the skin once a week. For chronic pain syndrome 06/02/21 08/25/21 Yes Gillis Santa, MD  busPIRone (BUSPAR) 5 MG tablet Take 5  mg by mouth 3 (three) times daily.  Patient not taking: Reported on 06/02/2021    [provider]  oxyCODONE (OXY IR/ROXICODONE) 5 MG immediate release tablet Take 1 tablet (5 mg total) by mouth every 6 (six) hours as needed for moderate pain (pain score 4-6). Patient not taking: Reported on 06/02/2021 06/17/20   Enzo Bi, MD    Allergies: Allergies  Allergen Reactions   Gabapentin Shortness Of Breath and Rash   Mobic [Meloxicam] Anaphylaxis   Penicillins Shortness Of Breath and Swelling    Has patient had a PCN reaction causing immediate rash, facial/tongue/throat swelling, SOB or lightheadedness with hypotension: unknown Has patient had a PCN reaction causing severe rash involving mucus membranes or skin necrosis: {unknown Has  patient had a PCN reaction that required hospitalization {unknown Has patient had a PCN reaction occurring within the last 10 years: no If all of the above answers are "NO", then may proceed with Cephalosporin use.   Sulfa Antibiotics Shortness Of Breath and Swelling    Social History:  reports that she has been smoking cigarettes. She has a 45.00 pack-year smoking history. She has never used smokeless tobacco. She reports current alcohol use. She reports that she does not use drugs.   Family History: Family History  Problem Relation Age of Onset   Cancer Mother        beast    Review of Systems: Review of Systems  Constitutional:  Negative for chills and fever.  HENT:  Negative for hearing loss.   Respiratory:  Negative for shortness of breath.   Cardiovascular:  Negative for chest pain.  Gastrointestinal:  Positive for abdominal pain, diarrhea and nausea. Negative for vomiting.  Genitourinary:  Negative for dysuria.  Musculoskeletal:  Negative for myalgias.  Skin:  Negative for rash.  Neurological:  Negative for dizziness.  Psychiatric/Behavioral:  Negative for depression.    Physical Exam BP 131/84    Pulse 88    Temp 98.3 F (36.8 C) (Oral)    Resp 14    Wt 81.6 kg    SpO2 98%    BMI 30.90 kg/m  CONSTITUTIONAL: No acute distress HEENT:  Normocephalic, atraumatic, extraocular motion intact. NECK: Trachea is midline, and there is no jugular venous distension. RESPIRATORY:  Normal respiratory effort without pathologic use of accessory muscles.  Currently on nasal canula supplemental oxygen. CARDIOVASCULAR: Regular rhythm and rate. GI: The abdomen is soft, obese, non-distended, with some tenderness as I approach the RUQ.  However, her pain is more focused in the right lower portion of the ribcage rather than the right upper portion of the abdomen.  MUSCULOSKELETAL:  Patient had reproducible pain in the right lower portion of her ribcage anteriorly extending laterally.  This is  mostly seen when I push on her inferior rib. SKIN: No bruising in the abdomen or chest.  NEUROLOGIC:  Motor and sensation is grossly normal.  Cranial nerves are grossly intact. PSYCH:  Alert and oriented to person, place and time. Affect is normal.  Laboratory Analysis: Results for orders placed or performed during the hospital encounter of 08/10/21 (from the past 24 hour(s))  Procalcitonin - Baseline     Status: None   Collection Time: 08/10/21 10:03 PM  Result Value Ref Range   Procalcitonin 1.01 ng/mL  Acetaminophen level     Status: Abnormal   Collection Time: 08/10/21 10:03 PM  Result Value Ref Range   Acetaminophen (Tylenol), Serum <10 (L) 10 - 30 ug/mL  Salicylate level  Status: Abnormal   Collection Time: 08/10/21 10:03 PM  Result Value Ref Range   Salicylate Lvl <3.1 (L) 7.0 - 30.0 mg/dL  HIV Antibody (routine testing w rflx)     Status: None   Collection Time: 08/10/21 10:35 PM  Result Value Ref Range   HIV Screen 4th Generation wRfx Non Reactive Non Reactive  Culture, blood (routine x 2) Call MD if unable to obtain prior to antibiotics being given     Status: None (Preliminary result)   Collection Time: 08/10/21 10:35 PM   Specimen: BLOOD LEFT FOREARM  Result Value Ref Range   Specimen Description BLOOD LEFT FOREARM    Special Requests      BOTTLES DRAWN AEROBIC AND ANAEROBIC Blood Culture results may not be optimal due to an inadequate volume of blood received in culture bottles   Culture      NO GROWTH < 12 HOURS Performed at [facility], [address] [place], [place] [address]    Report Status PENDING   CBC     Status: Abnormal   Collection Time: 08/10/21 10:35 PM  Result Value Ref Range   WBC 14.6 (H) 4.0 - 10.5 K/uL   RBC 4.47 3.87 - 5.11 MIL/uL   Hemoglobin 13.1 12.0 - 15.0 g/dL   HCT 43.8 36.0 - 46.0 %   MCV 98.0 80.0 - 100.0 fL   MCH 29.3 26.0 - 34.0 pg   MCHC 29.9 (L) 30.0 - 36.0 g/dL   RDW 13.1 11.5 - 15.5 %   Platelets 166 150 - 400  K/uL   nRBC 0.0 0.0 - 0.2 %  Creatinine, serum     Status: Abnormal   Collection Time: 08/10/21 10:35 PM  Result Value Ref Range   Creatinine, Ser 1.12 (H) 0.44 - 1.00 mg/dL   GFR, Estimated 55 (L) >60 mL/min  Culture, blood (routine x 2) Call MD if unable to obtain prior to antibiotics being given     Status: None (Preliminary result)   Collection Time: 08/10/21 10:40 PM   Specimen: BLOOD LEFT FOREARM  Result Value Ref Range   Specimen Description BLOOD LEFT FOREARM    Special Requests      BOTTLES DRAWN AEROBIC AND ANAEROBIC Blood Culture results may not be optimal due to an inadequate volume of blood received in culture bottles   Culture      NO GROWTH < 12 HOURS Performed at [facility], [address] [place], [place] [address]    Report Status PENDING   Blood gas, arterial     Status: Abnormal   Collection Time: 08/10/21 10:52 PM  Result Value Ref Range   FIO2 0.40    Delivery systems NASAL CANNULA    pH, Arterial 7.24 (L) 7.350 - 7.450   pCO2 arterial 90 (HH) 32.0 - 48.0 mmHg   pO2, Arterial 61 (L) 83.0 - 108.0 mmHg   Bicarbonate 38.6 (H) 20.0 - 28.0 mmol/L   Acid-Base Excess 7.7 (H) 0.0 - 2.0 mmol/L   O2 Saturation 86.3 %   Patient temperature 37.0    Collection site RIGHT RADIAL    Sample type ARTERIAL DRAW    Allens test (pass/fail) PASS PASS  Lactic acid, plasma     Status: None   Collection Time: 08/10/21 11:14 PM  Result Value Ref Range   Lactic Acid, Venous 0.9 0.5 - 1.9 mmol/L  CBG monitoring, ED     Status: None   Collection Time: 08/10/21 11:43 PM  Result Value Ref  Range   Glucose-Capillary 79 70 - 99 mg/dL  CBG monitoring, ED     Status: Abnormal   Collection Time: 08/11/21 12:09 AM  Result Value Ref Range   Glucose-Capillary 167 (H) 70 - 99 mg/dL  CBG monitoring, ED     Status: None   Collection Time: 08/11/21 12:45 AM  Result Value Ref Range   Glucose-Capillary 76 70 - 99 mg/dL  Blood gas, arterial     Status: Abnormal   Collection  Time: 08/11/21 12:51 AM  Result Value Ref Range   FIO2 0.50    Delivery systems BILEVEL POSITIVE AIRWAY PRESSURE    LHR 12 resp/min   Inspiratory PAP 16    Expiratory PAP 8    pH, Arterial 7.23 (L) 7.350 - 7.450   pCO2 arterial 94 (HH) 32.0 - 48.0 mmHg   pO2, Arterial 116 (H) 83.0 - 108.0 mmHg   Bicarbonate 39.4 (H) 20.0 - 28.0 mmol/L   Acid-Base Excess 8.4 (H) 0.0 - 2.0 mmol/L   O2 Saturation 97.8 %   Patient temperature 37.0    Collection site LEFT RADIAL    Sample type ARTERIAL DRAW    Allens test (pass/fail) PASS PASS  CBG monitoring, ED     Status: Abnormal   Collection Time: 08/11/21  1:27 AM  Result Value Ref Range   Glucose-Capillary 58 (L) 70 - 99 mg/dL  Lactic acid, plasma     Status: None   Collection Time: 08/11/21  1:34 AM  Result Value Ref Range   Lactic Acid, Venous 0.7 0.5 - 1.9 mmol/L  Basic metabolic panel     Status: Abnormal   Collection Time: 08/11/21  1:37 AM  Result Value Ref Range   Sodium 137 135 - 145 mmol/L   Potassium 5.3 (H) 3.5 - 5.1 mmol/L   Chloride 96 (L) 98 - 111 mmol/L   CO2 35 (H) 22 - 32 mmol/L   Glucose, Bld 63 (L) 70 - 99 mg/dL   BUN 13 8 - 23 mg/dL   Creatinine, Ser 1.01 (H) 0.44 - 1.00 mg/dL   Calcium 8.2 (L) 8.9 - 10.3 mg/dL   GFR, Estimated >60 >60 mL/min   Anion gap 6 5 - 15  Procalcitonin     Status: None   Collection Time: 08/11/21  1:37 AM  Result Value Ref Range   Procalcitonin 1.65 ng/mL  CBG monitoring, ED     Status: Abnormal   Collection Time: 08/11/21  2:19 AM  Result Value Ref Range   Glucose-Capillary 100 (H) 70 - 99 mg/dL  Blood gas, venous     Status: Abnormal   Collection Time: 08/11/21  3:00 AM  Result Value Ref Range   FIO2 0.36    Delivery systems BILEVEL POSITIVE AIRWAY PRESSURE    Mode Average Volume Assured Pressure Support    LHR 22 resp/min   pH, Ven 7.27 7.250 - 7.430   pCO2, Ven 84 (HH) 44.0 - 60.0 mmHg   pO2, Ven 56.0 (H) 32.0 - 45.0 mmHg   Bicarbonate 38.6 (H) 20.0 - 28.0 mmol/L   Acid-Base  Excess 8.6 (H) 0.0 - 2.0 mmol/L   O2 Saturation 84.2 %   Patient temperature 37.0    Collection site VEIN    Sample type VENOUS    Mechanical Rate 22   CBG monitoring, ED     Status: Abnormal   Collection Time: 08/11/21  3:22 AM  Result Value Ref Range   Glucose-Capillary 104 (H) 70 - 99 mg/dL  Blood gas, venous     Status: Abnormal   Collection Time: 08/11/21  5:00 AM  Result Value Ref Range   FIO2 0.36    Delivery systems BILEVEL POSITIVE AIRWAY PRESSURE    LHR 22 resp/min   pH, Ven 7.30 7.250 - 7.430   pCO2, Ven 72 (HH) 44.0 - 60.0 mmHg   pO2, Ven 55.0 (H) 32.0 - 45.0 mmHg   Bicarbonate 35.4 (H) 20.0 - 28.0 mmol/L   Acid-Base Excess 6.6 (H) 0.0 - 2.0 mmol/L   O2 Saturation 84.7 %   Patient temperature 37.0    Collection site VEIN    Sample type VENOUS    Mechanical Rate 22   Comprehensive metabolic panel     Status: Abnormal   Collection Time: 08/11/21  5:16 AM  Result Value Ref Range   Sodium 137 135 - 145 mmol/L   Potassium 4.9 3.5 - 5.1 mmol/L   Chloride 98 98 - 111 mmol/L   CO2 32 22 - 32 mmol/L   Glucose, Bld 149 (H) 70 - 99 mg/dL   BUN 14 8 - 23 mg/dL   Creatinine, Ser 0.99 0.44 - 1.00 mg/dL   Calcium 8.2 (L) 8.9 - 10.3 mg/dL   Total Protein 7.1 6.5 - 8.1 g/dL   Albumin 3.2 (L) 3.5 - 5.0 g/dL   AST 149 (H) 15 - 41 U/L   ALT 33 0 - 44 U/L   Alkaline Phosphatase 66 38 - 126 U/L   Total Bilirubin 1.0 0.3 - 1.2 mg/dL   GFR, Estimated >60 >60 mL/min   Anion gap 7 5 - 15  Ammonia     Status: None   Collection Time: 08/11/21  5:16 AM  Result Value Ref Range   Ammonia 20 9 - 35 umol/L  HIV Antibody (routine testing w rflx)     Status: None   Collection Time: 08/11/21  5:16 AM  Result Value Ref Range   HIV Screen 4th Generation wRfx Non Reactive Non Reactive  Magnesium     Status: None   Collection Time: 08/11/21  5:16 AM  Result Value Ref Range   Magnesium 2.3 1.7 - 2.4 mg/dL  Phosphorus     Status: Abnormal   Collection Time: 08/11/21  5:16 AM  Result  Value Ref Range   Phosphorus 4.8 (H) 2.5 - 4.6 mg/dL  Brain natriuretic peptide     Status: Abnormal   Collection Time: 08/11/21  5:16 AM  Result Value Ref Range   B Natriuretic Peptide 358.7 (H) 0.0 - 100.0 pg/mL  Resp Panel by RT-PCR (Flu A&B, Covid) Nasopharyngeal Swab     Status: None   Collection Time: 08/11/21  5:29 AM   Specimen: Nasopharyngeal Swab; Nasopharyngeal(NP) swabs in vial transport medium  Result Value Ref Range   SARS Coronavirus 2 by RT PCR NEGATIVE NEGATIVE   Influenza A by PCR NEGATIVE NEGATIVE   Influenza B by PCR NEGATIVE NEGATIVE  Respiratory (~20 pathogens) panel by PCR     Status: None   Collection Time: 08/11/21  5:29 AM   Specimen: Nasopharyngeal Swab; Respiratory  Result Value Ref Range   Adenovirus NOT DETECTED NOT DETECTED   Coronavirus 229E NOT DETECTED NOT DETECTED   Coronavirus HKU1 NOT DETECTED NOT DETECTED   Coronavirus NL63 NOT DETECTED NOT DETECTED   Coronavirus OC43 NOT DETECTED NOT DETECTED   Metapneumovirus NOT DETECTED NOT DETECTED   Rhinovirus / Enterovirus NOT DETECTED NOT DETECTED   Influenza A NOT DETECTED NOT DETECTED  Influenza B NOT DETECTED NOT DETECTED   Parainfluenza Virus 1 NOT DETECTED NOT DETECTED   Parainfluenza Virus 2 NOT DETECTED NOT DETECTED   Parainfluenza Virus 3 NOT DETECTED NOT DETECTED   Parainfluenza Virus 4 NOT DETECTED NOT DETECTED   Respiratory Syncytial Virus NOT DETECTED NOT DETECTED   Bordetella pertussis NOT DETECTED NOT DETECTED   Bordetella Parapertussis NOT DETECTED NOT DETECTED   Chlamydophila pneumoniae NOT DETECTED NOT DETECTED   Mycoplasma pneumoniae NOT DETECTED NOT DETECTED  CBG monitoring, ED     Status: Abnormal   Collection Time: 08/11/21  8:11 AM  Result Value Ref Range   Glucose-Capillary 133 (H) 70 - 99 mg/dL  CBG monitoring, ED     Status: Abnormal   Collection Time: 08/11/21 11:28 AM  Result Value Ref Range   Glucose-Capillary 139 (H) 70 - 99 mg/dL  MRSA Next Gen by PCR, Nasal      Status: None   Collection Time: 08/11/21 12:30 PM   Specimen: Nasal Mucosa; Nasal Swab  Result Value Ref Range   MRSA by PCR Next Gen NOT DETECTED NOT DETECTED  CBC     Status: Abnormal   Collection Time: 08/11/21  2:44 PM  Result Value Ref Range   WBC 4.7 4.0 - 10.5 K/uL   RBC 4.09 3.87 - 5.11 MIL/uL   Hemoglobin 11.9 (L) 12.0 - 15.0 g/dL   HCT 38.2 36.0 - 46.0 %   MCV 93.4 80.0 - 100.0 fL   MCH 29.1 26.0 - 34.0 pg   MCHC 31.2 30.0 - 36.0 g/dL   RDW 13.1 11.5 - 15.5 %   Platelets 120 (L) 150 - 400 K/uL   nRBC 0.0 0.0 - 0.2 %  CBG monitoring, ED     Status: Abnormal   Collection Time: 08/11/21  4:19 PM  Result Value Ref Range   Glucose-Capillary 171 (H) 70 - 99 mg/dL  Troponin I (High Sensitivity)     Status: Abnormal   Collection Time: 08/11/21  6:25 PM  Result Value Ref Range   Troponin I (High Sensitivity) 642 (HH) <18 ng/L    Imaging: DG Chest Portable 1 View  Result Date: 08/10/2021 CLINICAL DATA:  Shortness of breath EXAM: PORTABLE CHEST 1 VIEW COMPARISON:  08/10/2021 FINDINGS: NG tube is in the stomach. Heart is normal size. Patchy opacities in the upper lobes, left greater than right. No effusions. No acute bony abnormality. IMPRESSION: Patchy opacities in the upper lobes, left greater than right concerning for pneumonia. Electronically Signed   By: Rolm Baptise M.D.   On: 08/10/2021 22:26   DG Abd Portable 1 View  Result Date: 08/10/2021 CLINICAL DATA:  Shortness of breath EXAM: PORTABLE ABDOMEN - 1 VIEW COMPARISON:  Chest x-ray 06/10/2020, CT 08/27/2011 FINDINGS: Esophageal tube looped over the epigastric area with tip projecting over distal stomach, side-port at the level of GE junction. There may be a small hiatal hernia. IMPRESSION: Esophageal tube is looped upon itself over the epigastric area with the tip projecting over the distal stomach Electronically Signed   By: Donavan Foil M.D.   On: 08/10/2021 22:28   US Abdomen Limited RUQ (LIVER/GB)  Result Date:  08/11/2021 CLINICAL DATA:  Increased ammonia levels EXAM: ULTRASOUND ABDOMEN LIMITED RIGHT UPPER QUADRANT COMPARISON:  No prior abdominal ultrasound, FINDINGS: Gallbladder: Multiple gallstones, measuring up to 1.8 cm. No wall thickening or pericholecystic fluid. Negative Murphy sign. Common bile duct: Diameter: 4 mm Liver: Mild nodularity of the liver borders. No focal lesion identified. The liver  is normal in echogenicity. Portal vein is patent on color Doppler imaging with normal direction of blood flow towards the liver. Other: Evaluation is somewhat limited as the patient was not able to be put in the decubitus position. IMPRESSION: 1. Cholelithiasis without evidence of cholecystitis. 2. Mild hepatic nodularity, as can be seen with cirrhosis. Normal liver echogenicity. No focal lesion. Electronically Signed   By: Merilyn Baba M.D.   On: 08/11/2021 03:40    Assessment and Plan: This is a 65 y.o. female with right sided pain, mostly in the lower portion of the right ribcage.  --Discussed with the patient the findings on her ultrasound today.  There is cholelithiasis but there is no evidence of cholecystitis.  There is some liver nodularity, but her LFTs are mostly normal except for mildly elevated AST.  However, her ammonia level is quite elevated to 136.  Unclear if her pain is related to her gallbladder as she has not had any prior episodes of pain and there are no significant changes in her ultrasound.  On my exam, her pain seems mostly focused in the right lower portion of her ribcage, and when pushing on her ribs, she does have quite a bit of pain.  This could explain her pain and how it hurts taking a deep breath.  If she was found unresponsive, she could have potentially fallen.   --I will order first xrays of her right ribs and chest to further evaluate for potential rib fracture.  If those are negative, then would order HIDA scan to evaluate for cholecystitis.  Given her multiple comorbidities,  current pneumonia and recent hypercapnea, do not think she would be a good surgical candidate. --If her workup is negative, would also recommend GI consult given her liver nodularity and high ammonia level  I spent 80 minutes dedicated to the care of this patient on the date of this encounter to include pre-visit review of records, face-to-face time with the patient discussing diagnosis and management, and any post-visit coordination of care.   Melvyn Neth, MD [facility] Pg:  [phone]

## 2021-08-11 NOTE — ED Notes (Signed)
Pt again removed bipap and again yelling to be able to drink water or to have water squirted up their nose. This RN again explained why that could not happen and why pt needed to keep bipap on. This RN replaced bipap ensuring adequate seal.

## 2021-08-11 NOTE — ED Notes (Signed)
Pt again removed bipap and again yelling for water up nose and in mouth. This RN again explained why that could not happen and why mask needs to stay on. This RN again replaced bipap mask and ensured adequate seal.

## 2021-08-11 NOTE — ED Notes (Signed)
RT called to pick up Bipap to take up to patient's room

## 2021-08-11 NOTE — ED Notes (Signed)
Critical Troponin--> 642, MD notified and aware

## 2021-08-11 NOTE — Evaluation (Signed)
Physical Therapy Evaluation Patient Details Name: Bethany Villa MRN: [id number] DOB: [birth year] Today's Date: 08/11/2021  History of Present Illness  presented to ER secondary to episode of unresponsiveness; admitted for managementof acute/chronic respiratory failure with hypercapnia, acute metabolic encephalopathy.  Per imaging, noted with cholelithiasis without evidence of cholecystitis.  Clinical Impression  Patient alert and oriented; follows commands and agreeable to participation with session (within ability).  Rates mild discomfort R upper quadrant 5-6/10 at rest, increases to 9-10/10 with movement.  RN informed/aware and meds requested as able.  Did coach through repositioning and pursed lip breathing/relaxation for pain management; patient demonstrates good integration with cuing.  Bilat UE/LE strength and ROM grossly symmetrical and WFL; no focal weakness appreciated.  Able to roll and reposition self in bed with mod indep; however, unable to tolerate attempts at edge of bed/OOB due to R UQ pain.  Will continue to assess/progress mobility as appropriate in subsequent sessions. Of note, patient on 3L throughout session; resting sats 90-91%, with exertion/pain, fluctuates 85-90%, improves with pursed lip breathing and relaxation. Would benefit from skilled PT to address above deficits and promote optimal return to PLOF.; discharge recommendation pending additional mobility assessment in subsequent sessions (as patient pain and respiratory status allows).      Recommendations for follow up therapy are one component of a multi-disciplinary discharge planning process, led by the attending physician.  Recommendations may be updated based on patient status, additional functional criteria and insurance authorization.  Follow Up Recommendations Other (comment) (pending additional mobility assessment; anticipate HHPT once pain controlled to allow for full mobility assessment)    Assistance  Recommended at Discharge PRN  Functional Status Assessment Patient has had a recent decline in their functional status and demonstrates the ability to make significant improvements in function in a reasonable and predictable amount of time.  Equipment Recommendations       Recommendations for Other Services       Precautions / Restrictions Precautions Precautions: Fall Restrictions Weight Bearing Restrictions: No      Mobility  Bed Mobility Overal bed mobility: Modified Independent Bed Mobility: Rolling Rolling: Modified independent (Device/Increase time)         General bed mobility comments: scooting up in bed (scooting hips backwards), mod indep    Transfers                   General transfer comment: unable to tolerate due to R UQ pain    Ambulation/Gait               General Gait Details: unable to tolerate due to R UQ pain  Stairs            Wheelchair Mobility    Modified Rankin (Stroke Patients Only)       Balance                                             Pertinent Vitals/Pain Pain Assessment: 0-10 Pain Score: 9  Pain Location: R UQ Pain Descriptors / Indicators: Aching;Grimacing;Guarding;Moaning Pain Intervention(s): Limited activity within patient's tolerance;Monitored during session;Repositioned    Home Living Family/patient expects to be discharged to:: Private residence Living Arrangements: Alone Available Help at Discharge: Family;Available PRN/intermittently Type of Home: Apartment Home Access: Level entry       Home Layout: One level Home Equipment: Agricultural consultant (2 wheels)  Prior Function Prior Level of Function : Independent/Modified Independent             Mobility Comments: Mod indep without assist device for ADLs, household mobilization; denies fall history.  Home O2 at night only.  Sons assist with driving, community errands/needs.       Hand Dominance         Extremity/Trunk Assessment   Upper Extremity Assessment Upper Extremity Assessment: Overall WFL for tasks assessed    Lower Extremity Assessment Lower Extremity Assessment: Overall WFL for tasks assessed (grossly at least 4 to 4+/5 throughout)       Communication   Communication: No difficulties  Cognition Arousal/Alertness: Awake/alert Behavior During Therapy: WFL for tasks assessed/performed;Anxious Overall Cognitive Status: Within Functional Limits for tasks assessed                                          General Comments      Exercises     Assessment/Plan    PT Assessment Patient needs continued PT services  PT Problem List Decreased activity tolerance;Decreased balance;Decreased mobility;Decreased knowledge of use of DME;Decreased safety awareness;Decreased knowledge of precautions;Pain;Cardiopulmonary status limiting activity       PT Treatment Interventions DME instruction;Gait training;Functional mobility training;Therapeutic activities;Balance training;Therapeutic exercise;Patient/family education    PT Goals (Current goals can be found in the Care Plan section)  Acute Rehab PT Goals Patient Stated Goal: to make this pain better PT Goal Formulation: With patient Time For Goal Achievement: 08/25/21 Potential to Achieve Goals: Good    Frequency Min 2X/week   Barriers to discharge        Co-evaluation               AM-PAC PT "6 Clicks" Mobility  Outcome Measure Help needed turning from your back to your side while in a flat bed without using bedrails?: None Help needed moving from lying on your back to sitting on the side of a flat bed without using bedrails?: None Help needed moving to and from a bed to a chair (including a wheelchair)?: A Little Help needed standing up from a chair using your arms (e.g., wheelchair or bedside chair)?: A Little Help needed to walk in hospital room?: A Little Help needed climbing 3-5 steps with a  railing? : A Lot 6 Click Score: 19    End of Session   Activity Tolerance: Patient limited by pain Patient left: in bed;with call bell/phone within reach Nurse Communication: Mobility status PT Visit Diagnosis: Muscle weakness (generalized) (M62.81);Difficulty in walking, not elsewhere classified (R26.2);Pain    Time: 1011-1027 PT Time Calculation (min) (ACUTE ONLY): 16 min   Charges:   PT Evaluation $PT Eval Moderate Complexity: 1 Mod          Ermin Parisien H. Manson Passey, PT, DPT, NCS 08/11/21, 10:42 AM [phone]

## 2021-08-12 ENCOUNTER — Inpatient Hospital Stay: Payer: Medicare Other

## 2021-08-12 ENCOUNTER — Encounter: Payer: Self-pay | Admitting: Internal Medicine

## 2021-08-12 ENCOUNTER — Other Ambulatory Visit: Payer: Self-pay

## 2021-08-12 LAB — GLUCOSE, CAPILLARY
Glucose-Capillary: 117 mg/dL — ABNORMAL HIGH (ref 70–99)
Glucose-Capillary: 127 mg/dL — ABNORMAL HIGH (ref 70–99)
Glucose-Capillary: 140 mg/dL — ABNORMAL HIGH (ref 70–99)
Glucose-Capillary: 142 mg/dL — ABNORMAL HIGH (ref 70–99)
Glucose-Capillary: 159 mg/dL — ABNORMAL HIGH (ref 70–99)

## 2021-08-12 LAB — PROCALCITONIN: Procalcitonin: 0.93 ng/mL

## 2021-08-12 MED ORDER — LIDOCAINE 5 % EX PTCH
1.0000 | MEDICATED_PATCH | CUTANEOUS | Status: DC
  Administered 2021-08-12 – 2021-08-13 (×2): 1 via TRANSDERMAL
  Filled 2021-08-12 (×2): qty 1

## 2021-08-12 MED ORDER — NICOTINE 14 MG/24HR TD PT24
14.0000 mg | MEDICATED_PATCH | Freq: Every day | TRANSDERMAL | Status: DC
  Administered 2021-08-12 – 2021-08-13 (×2): 14 mg via TRANSDERMAL
  Filled 2021-08-12 (×2): qty 1

## 2021-08-12 MED ORDER — MORPHINE SULFATE (PF) 2 MG/ML IV SOLN
2.0000 mg | Freq: Once | INTRAVENOUS | Status: AC
  Administered 2021-08-12: 07:00:00 2 mg via INTRAVENOUS
  Filled 2021-08-12: qty 1

## 2021-08-12 MED ORDER — LORAZEPAM 2 MG/ML IJ SOLN
0.5000 mg | Freq: Once | INTRAMUSCULAR | Status: AC
  Administered 2021-08-12: 13:00:00 0.5 mg via INTRAVENOUS
  Filled 2021-08-12: qty 1

## 2021-08-12 MED ORDER — LOSARTAN POTASSIUM 50 MG PO TABS
50.0000 mg | ORAL_TABLET | Freq: Every day | ORAL | Status: DC
  Administered 2021-08-13: 50 mg via ORAL
  Filled 2021-08-12: qty 1

## 2021-08-12 MED ORDER — TECHNETIUM TC 99M MEBROFENIN IV KIT
5.0000 | PACK | Freq: Once | INTRAVENOUS | Status: AC | PRN
  Administered 2021-08-12: 12:00:00 5.29 via INTRAVENOUS

## 2021-08-12 MED ORDER — LOSARTAN POTASSIUM 25 MG PO TABS
25.0000 mg | ORAL_TABLET | Freq: Once | ORAL | Status: AC
  Administered 2021-08-12: 20:00:00 25 mg via ORAL
  Filled 2021-08-12: qty 1

## 2021-08-12 MED ORDER — KETOROLAC TROMETHAMINE 15 MG/ML IJ SOLN
15.0000 mg | Freq: Four times a day (QID) | INTRAMUSCULAR | Status: DC | PRN
  Administered 2021-08-12 – 2021-08-13 (×3): 15 mg via INTRAVENOUS
  Filled 2021-08-12 (×3): qty 1

## 2021-08-12 NOTE — Progress Notes (Signed)
PROGRESS NOTE    CALIANNA Villa  ZOX:[id number] DOB: [birth year] DOA: 08/10/2021 PCP: [name], MD   Brief Narrative: Taken from H&P. Bethany Villa is a 66 y.o. female with medical history significant for COPD, chronic hypoxic and hypercapnic respiratory failure on home O2 at 2 L, previously a candidate for home trilogy machine, systolic CHF (EF 40 to 45% 05/2019), chronic neck and back pain and fibromyalgia on chronic opiates, who presents by EMS due to being found unresponsive. Patient had an history of hospitalization in February 2021 for unresponsiveness and believed to be secondary to noncompliance with home oxygen with combination of buprenorphine patch. Patient received Narcan twice with minimal response. ABG with hypoxia and hypercapnia, she was placed on BiPAP with improvement in mental status. Labs pertinent for mild leukocytosis, hyperkalemia with potassium of 5.9, AKI and ammonia of 136.  COVID-19 and flu PCR negative.  UA and UDS without any significant abnormality. Chest x-ray with heterogeneous opacities concerning for infection.  CT head without any acute abnormality. Right upper quadrant ultrasound with concern of liver cirrhosis and cholelithiasis with no cholecystitis.  General surgery was consulted for her complaint of right upper quadrant pain.  Pain and tenderness was more on lower right rib cage.  Rib x-ray with acute nondisplaced sixth rib fracture most likely secondary to fall.  Surgery also ordered HIDA scan-will be done today.  Elevated troponin, echocardiogram still pending.  Cardiology was consulted today to rule out any cardiac cause of her syncopal episode.  Subjective: Patient was complaining of right upper quadrant pain, no nausea or vomiting.  She was feeling hungry and wants to eat.  Denies any chest pain. Per patient today she was feeling little short of breath before passing out?  Assessment & Plan:   Principal Problem:   Acute metabolic  encephalopathy Active Problems:   COPD (chronic obstructive pulmonary disease) (HCC)   Depression   Acute on chronic respiratory failure with hypercapnia (HCC)   Chronic systolic CHF (congestive heart failure) (HCC)   Altered mental status   Episode of unresponsiveness   Hyperammonemia (HCC)   Chronic, continuous use of opioids   AKI (acute kidney injury) (HCC)   Hyperkalemia  Acute on chronic hypoxic and hypercapnic respiratory failure/COPD exacerbation.  Initially requiring BiPAP, now weaned off to baseline oxygen requirement of 2 L.  Mental status now at baseline.  Respiratory viral panel, COVID-19 and flu PCR negative. -Continue with supplemental oxygen -BiPAP as needed -Continue with bronchodilators -Continue with steroid  Community-acquired pneumonia.  Per patient she frequently chokes on food.  Swallow evaluation was obtained and found to have no significant risk for aspiration and they were recommending aspiration precautions only.  Procalcitonin elevated at 1.65>>0.93. -Continue with Zosyn for now as patient is also complaining of right upper quadrant pain, will downgrade to Augmentin if HIDA scan is negative  Right upper quadrant pain.  Ultrasound with cholelithiasis, no acute cholecystitis. -Surgery consult. -Going for HIDA scan -Continue with pain management  Right rib fracture.  Because of her complaint of right upper quadrant pain and tenderness along lower rib cage, rib x-ray was obtained which shows an acute nondisplaced sixth rib fracture which can be the reason of her pain. -Try lidocaine patch -Continue with pain management  Elevated troponin.  Found to have elevated troponin which peaked at 692 and now trending down.  No chest pain.  Patient has an history of HFrEF, was seen by [facility] cardiology during one of the prior hospitalization.  No  follow-up noted. -Echocardiogram -Cardiology consult, for elevated troponin and also to rule out any cardiac cause of her  syncope  Liver cirrhosis.  RUQ ultrasound with concern of liver cirrhosis, per patient no prior diagnosis.  She denies any regular alcohol intake. Ammonia levels were elevated at 136 which has been normalized .  Mildly elevated AST. -Continue with lactulose.  AKI.  Resolved.  History of HFrEF.  BNP at 358. Prior echo done in 2020 with EF of 40 to 45%.  Appears euvolemic. -Repeat echocardiogram. -Monitor volume status  Hyperkalemia.  Resolved.  Received Lokelma. -Potassium within normal limit now  Depression.  No acute concern. -Continue with home dose of Zoloft  Objective: Vitals:   08/12/21 0802 08/12/21 1100 08/12/21 1421 08/12/21 1425  BP:   (!) 163/90   Pulse:   84   Resp:   (!) 22   Temp:  98.3 F (36.8 C)    TempSrc:      SpO2: 94%  91% 92%  Weight:      Height:        Intake/Output Summary (Last 24 hours) at 08/12/2021 1428 Last data filed at 08/12/2021 1016 Gross per 24 hour  Intake 1896.39 ml  Output 300 ml  Net 1596.39 ml    Filed Weights   08/11/21 0125  Weight: 81.6 kg    Examination:  General.  Well-developed lady, in no acute distress. Pulmonary.  Lungs clear bilaterally, normal respiratory effort. CV.  Regular rate and rhythm, no JVD, rub or murmur. Abdomen.  Soft, RUQ tenderness, nondistended, BS positive. CNS.  Alert and oriented x3.  No focal neurologic deficit. Extremities.  No edema, no cyanosis, pulses intact and symmetrical. Psychiatry.  Judgment and insight appears normal.   DVT prophylaxis: Lovenox Code Status: Full Family Communication:  Disposition Plan:  Status is: Inpatient  Remains inpatient appropriate because: Severity of illness   Level of care: Progressive  All the records are reviewed and case discussed with Care Management/Social Worker. Management plans discussed with the patient, nursing and they are in agreement.  Consultants:  General surgery Cardiology  Procedures:  Antimicrobials:  Zosyn  Data  Reviewed: I have personally reviewed following labs and imaging studies  CBC: Recent Labs  Lab 08/10/21 1821 08/10/21 2235 08/11/21 1444  WBC 12.3* 14.6* 4.7  NEUTROABS 9.3*  --   --   HGB 14.2 13.1 11.9*  HCT 48.1* 43.8 38.2  MCV 98.8 98.0 93.4  PLT 252 166 120*    Basic Metabolic Panel: Recent Labs  Lab 08/10/21 1821 08/10/21 2235 08/11/21 0137 08/11/21 0516  NA 137  --  137 137  K 5.9*  --  5.3* 4.9  CL 93*  --  96* 98  CO2 37*  --  35* 32  GLUCOSE 191*  --  63* 149*  BUN 10  --  13 14  CREATININE 1.20* 1.12* 1.01* 0.99  CALCIUM 9.0  --  8.2* 8.2*  MG  --   --   --  2.3  PHOS  --   --   --  4.8*    GFR: Estimated Creatinine Clearance: 59.4 mL/min (by C-G formula based on SCr of 0.99 mg/dL). Liver Function Tests: Recent Labs  Lab 08/10/21 1821 08/11/21 0516  AST 72* 149*  ALT 32 33  ALKPHOS 96 66  BILITOT 0.5 1.0  PROT 8.1 7.1  ALBUMIN 3.7 3.2*    No results for input(s): LIPASE, AMYLASE in the last 168 hours. Recent Labs  Lab 08/10/21 1821  08/11/21 0516  AMMONIA 136* 20    Coagulation Profile: No results for input(s): INR, PROTIME in the last 168 hours. Cardiac Enzymes: No results for input(s): CKTOTAL, CKMB, CKMBINDEX, TROPONINI in the last 168 hours. BNP (last 3 results) No results for input(s): PROBNP in the last 8760 hours. HbA1C: No results for input(s): HGBA1C in the last 72 hours. CBG: Recent Labs  Lab 08/11/21 2223 08/12/21 0027 08/12/21 0345 08/12/21 0744 08/12/21 1116  GLUCAP 135* 140* 142* 127* 117*    Lipid Profile: No results for input(s): CHOL, HDL, LDLCALC, TRIG, CHOLHDL, LDLDIRECT in the last 72 hours. Thyroid Function Tests: No results for input(s): TSH, T4TOTAL, FREET4, T3FREE, THYROIDAB in the last 72 hours. Anemia Panel: No results for input(s): VITAMINB12, FOLATE, FERRITIN, TIBC, IRON, RETICCTPCT in the last 72 hours. Sepsis Labs: Recent Labs  Lab 08/10/21 2203 08/10/21 2314 08/11/21 0134 08/11/21 0137  08/12/21 0539  PROCALCITON 1.01  --   --  1.65 0.93  LATICACIDVEN  --  0.9 0.7  --   --      Recent Results (from the past 240 hour(s))  Culture, blood (routine x 2) Call MD if unable to obtain prior to antibiotics being given     Status: None (Preliminary result)   Collection Time: 08/10/21 10:35 PM   Specimen: BLOOD LEFT FOREARM  Result Value Ref Range Status   Specimen Description BLOOD LEFT FOREARM  Final   Special Requests   Final    BOTTLES DRAWN AEROBIC AND ANAEROBIC Blood Culture results may not be optimal due to an inadequate volume of blood received in culture bottles   Culture   Final    NO GROWTH 2 DAYS Performed at [facility], [address] [place], [place] [address]    Report Status PENDING  Incomplete  Culture, blood (routine x 2) Call MD if unable to obtain prior to antibiotics being given     Status: None (Preliminary result)   Collection Time: 08/10/21 10:40 PM   Specimen: BLOOD LEFT FOREARM  Result Value Ref Range Status   Specimen Description BLOOD LEFT FOREARM  Final   Special Requests   Final    BOTTLES DRAWN AEROBIC AND ANAEROBIC Blood Culture results may not be optimal due to an inadequate volume of blood received in culture bottles   Culture   Final    NO GROWTH 2 DAYS Performed at [facility], [address], [place], [place] [address]    Report Status PENDING  Incomplete  Resp Panel by RT-PCR (Flu A&B, Covid) Nasopharyngeal Swab     Status: None   Collection Time: 08/11/21  5:29 AM   Specimen: Nasopharyngeal Swab; Nasopharyngeal(NP) swabs in vial transport medium  Result Value Ref Range Status   SARS Coronavirus 2 by RT PCR NEGATIVE NEGATIVE Final    Comment: (NOTE) SARS-CoV-2 target nucleic acids are NOT DETECTED.  The SARS-CoV-2 RNA is generally detectable in upper respiratory specimens during the acute phase of infection. The lowest concentration of SARS-CoV-2 viral copies this assay can detect is 138 copies/mL. A  negative result does not preclude SARS-Cov-2 infection and should not be used as the sole basis for treatment or other patient management decisions. A negative result may occur with  improper specimen collection/handling, submission of specimen other than nasopharyngeal swab, presence of viral mutation(s) within the areas targeted by this assay, and inadequate number of viral copies(<138 copies/mL). A negative result must be combined with clinical observations, patient history, and epidemiological information. The expected result  is Negative.  Fact Sheet for Patients:  BloggerCourse.com  Fact Sheet for Healthcare Providers:  SeriousBroker.it  This test is no t yet approved or cleared by the [place] FDA and  has been authorized for detection and/or diagnosis of SARS-CoV-2 by FDA under an Emergency Use Authorization (EUA). This EUA will remain  in effect (meaning this test can be used) for the duration of the COVID-19 declaration under Section 564(b)(1) of the Act, 21 U.S.C.section 360bbb-3(b)(1), unless the authorization is terminated  or revoked sooner.       Influenza A by PCR NEGATIVE NEGATIVE Final   Influenza B by PCR NEGATIVE NEGATIVE Final    Comment: (NOTE) The Xpert Xpress SARS-CoV-2/FLU/RSV plus assay is intended as an aid in the diagnosis of influenza from Nasopharyngeal swab specimens and should not be used as a sole basis for treatment. Nasal washings and aspirates are unacceptable for Xpert Xpress SARS-CoV-2/FLU/RSV testing.  Fact Sheet for Patients: BloggerCourse.com  Fact Sheet for Healthcare Providers: SeriousBroker.it  This test is not yet approved or cleared by the [place] FDA and has been authorized for detection and/or diagnosis of SARS-CoV-2 by FDA under an Emergency Use Authorization (EUA). This EUA will remain in effect (meaning this test can  be used) for the duration of the COVID-19 declaration under Section 564(b)(1) of the Act, 21 U.S.C. section 360bbb-3(b)(1), unless the authorization is terminated or revoked.  Performed at [facility], [address] [place], [place] [address]   Respiratory (~20 pathogens) panel by PCR     Status: None   Collection Time: 08/11/21  5:29 AM   Specimen: Nasopharyngeal Swab; Respiratory  Result Value Ref Range Status   Adenovirus NOT DETECTED NOT DETECTED Final   Coronavirus 229E NOT DETECTED NOT DETECTED Final    Comment: (NOTE) The Coronavirus on the Respiratory Panel, DOES NOT test for the novel  Coronavirus (2019 nCoV)    Coronavirus HKU1 NOT DETECTED NOT DETECTED Final   Coronavirus NL63 NOT DETECTED NOT DETECTED Final   Coronavirus OC43 NOT DETECTED NOT DETECTED Final   Metapneumovirus NOT DETECTED NOT DETECTED Final   Rhinovirus / Enterovirus NOT DETECTED NOT DETECTED Final   Influenza A NOT DETECTED NOT DETECTED Final   Influenza B NOT DETECTED NOT DETECTED Final   Parainfluenza Virus 1 NOT DETECTED NOT DETECTED Final   Parainfluenza Virus 2 NOT DETECTED NOT DETECTED Final   Parainfluenza Virus 3 NOT DETECTED NOT DETECTED Final   Parainfluenza Virus 4 NOT DETECTED NOT DETECTED Final   Respiratory Syncytial Virus NOT DETECTED NOT DETECTED Final   Bordetella pertussis NOT DETECTED NOT DETECTED Final   Bordetella Parapertussis NOT DETECTED NOT DETECTED Final   Chlamydophila pneumoniae NOT DETECTED NOT DETECTED Final   Mycoplasma pneumoniae NOT DETECTED NOT DETECTED Final    Comment: Performed at [facility], [address] [place], [place] [address]  MRSA Next Gen by PCR, Nasal     Status: None   Collection Time: 08/11/21 12:30 PM   Specimen: Nasal Mucosa; Nasal Swab  Result Value Ref Range Status   MRSA by PCR Next Gen NOT DETECTED NOT DETECTED Final    Comment: (NOTE) The GeneXpert MRSA Assay (FDA approved for NASAL specimens only), is one component of  a comprehensive MRSA colonization surveillance program. It is not intended to diagnose MRSA infection nor to guide or monitor treatment for MRSA infections. Test performance is not FDA approved in patients less than 57 years old. Performed at [facility], [address], [place], [place] [address]       Radiology Studies: DG Ribs Unilateral Right  Result Date: 08/12/2021 CLINICAL DATA:  Right-greater-than-left rib pain. EXAM: LEFT RIBS AND CHEST - 3+ VIEW; RIGHT RIBS - 2 VIEW COMPARISON:  Chest x-ray dated August 10, 2021. FINDINGS: Acute nondisplaced fracture of the right lateral sixth rib. No left-sided rib fracture. Stable cardiomediastinal silhouette. Patchy interstitial opacities in the left greater than right upper lobes have improved. No pleural effusion or pneumothorax. IMPRESSION: 1. Acute nondisplaced fracture of the right lateral sixth rib. 2. Improved bilateral upper lobe pneumonia. Electronically Signed   By: Obie Dredge M.D.   On: 08/12/2021 12:33   DG Ribs Unilateral W/Chest Left  Result Date: 08/12/2021 CLINICAL DATA:  Right-greater-than-left rib pain. EXAM: LEFT RIBS AND CHEST - 3+ VIEW; RIGHT RIBS - 2 VIEW COMPARISON:  Chest x-ray dated August 10, 2021. FINDINGS: Acute nondisplaced fracture of the right lateral sixth rib. No left-sided rib fracture. Stable cardiomediastinal silhouette. Patchy interstitial opacities in the left greater than right upper lobes have improved. No pleural effusion or pneumothorax. IMPRESSION: 1. Acute nondisplaced fracture of the right lateral sixth rib. 2. Improved bilateral upper lobe pneumonia. Electronically Signed   By: Obie Dredge M.D.   On: 08/12/2021 12:33   CT HEAD WO CONTRAST ( )  Result Date: 08/10/2021 CLINICAL DATA:  Mental status change. EXAM: CT HEAD WITHOUT CONTRAST TECHNIQUE: Contiguous axial images were obtained from the base of the skull through the vertex without intravenous contrast. COMPARISON:  Head  CT dated October 07, 2019 FINDINGS: Brain: No evidence of acute infarction, hemorrhage, hydrocephalus, extra-axial collection or mass lesion/mass effect. Vascular: No hyperdense vessel or unexpected calcification. Skull: Normal. Negative for fracture or focal lesion. Sinuses/Orbits: No acute finding. Other: None. IMPRESSION: No acute intracranial abnormality. Electronically Signed   By: Allegra Lai M.D.   On: 08/10/2021 21:11   DG Chest Portable 1 View  Result Date: 08/10/2021 CLINICAL DATA:  Shortness of breath EXAM: PORTABLE CHEST 1 VIEW COMPARISON:  08/10/2021 FINDINGS: NG tube is in the stomach. Heart is normal size. Patchy opacities in the upper lobes, left greater than right. No effusions. No acute bony abnormality. IMPRESSION: Patchy opacities in the upper lobes, left greater than right concerning for pneumonia. Electronically Signed   By: Charlett Nose M.D.   On: 08/10/2021 22:26   DG Chest Portable 1 View  Result Date: 08/10/2021 CLINICAL DATA:  Shortness of breath EXAM: PORTABLE CHEST 1 VIEW COMPARISON:  Chest x-ray dated June 27, 2020 FINDINGS: Cardiac and mediastinal contours are unchanged. Upper lung predominant heterogeneous opacities. No evidence of pleural effusion or pneumothorax. IMPRESSION: Upper lung predominant heterogeneous opacities, concerning for infection. Electronically Signed   By: Allegra Lai M.D.   On: 08/10/2021 19:01   DG Abd Portable 1 View  Result Date: 08/10/2021 CLINICAL DATA:  Shortness of breath EXAM: PORTABLE ABDOMEN - 1 VIEW COMPARISON:  Chest x-ray 06/10/2020, CT 08/27/2011 FINDINGS: Esophageal tube looped over the epigastric area with tip projecting over distal stomach, side-port at the level of GE junction. There may be a small hiatal hernia. IMPRESSION: Esophageal tube is looped upon itself over the epigastric area with the tip projecting over the distal stomach Electronically Signed   By: Jasmine Pang M.D.   On: 08/10/2021 22:28   US Abdomen  Limited RUQ (LIVER/GB)  Result Date: 08/11/2021 CLINICAL DATA:  Increased ammonia levels EXAM: ULTRASOUND ABDOMEN LIMITED RIGHT UPPER QUADRANT COMPARISON:  No prior abdominal ultrasound, FINDINGS: Gallbladder: Multiple gallstones,  measuring up to 1.8 cm. No wall thickening or pericholecystic fluid. Negative Murphy sign. Common bile duct: Diameter: 4 mm Liver: Mild nodularity of the liver borders. No focal lesion identified. The liver is normal in echogenicity. Portal vein is patent on color Doppler imaging with normal direction of blood flow towards the liver. Other: Evaluation is somewhat limited as the patient was not able to be put in the decubitus position. IMPRESSION: 1. Cholelithiasis without evidence of cholecystitis. 2. Mild hepatic nodularity, as can be seen with cirrhosis. Normal liver echogenicity. No focal lesion. Electronically Signed   By: Wiliam Ke M.D.   On: 08/11/2021 03:40    Scheduled Meds:  budesonide (PULMICORT) nebulizer solution  0.25 mg Nebulization BID   enoxaparin (LOVENOX) injection  40 mg Subcutaneous Q24H   ipratropium-albuterol  3 mL Nebulization Q6H   lactulose  30 g Oral TID   lidocaine  1 patch Transdermal Q24H   methylPREDNISolone (SOLU-MEDROL) injection  60 mg Intravenous Q12H   pantoprazole (PROTONIX) IV  40 mg Intravenous Q24H   Continuous Infusions:  piperacillin-tazobactam (ZOSYN)  IV 3.375 g (08/12/21 0512)     LOS: 2 days   Time spent: 40 minutes. More than 50% of the time was spent in counseling/coordination of care  Arnetha Courser, MD [facility]  If 7PM-7AM, please contact night-coverage [email or web address]  08/12/2021, 2:28 PM   This record has been created using Conservation officer, historic buildings. Errors have been sought and corrected,but may not always be located. Such creation errors do not reflect on the standard of care.

## 2021-08-12 NOTE — Progress Notes (Signed)
[facility] PROGRESS NOTE (cpt [phone])  Hospital Day(s): 2.   Interval History: Patient seen and examined, no acute events or new complaints overnight. Patient reports she is overall feeling improved compared to yesterday but still with lower chest/upper abdominal pain, worse with deep inspiration. She denied any fever, chills, nausea, emesis. No new labs this morning. Rib XR was not obtained secondary to her inability to lay flat. She continues on Zosyn. She is on CLD.   Review of Systems:  Constitutional: denies fever, chills  HEENT: denies cough or congestion  Respiratory: + shortness of breath (improved some) Cardiovascular: + CP (musculoskeletal), denied palpitation Gastrointestinal: + abdominal pain, denied N/V Genitourinary: denies burning with urination or urinary frequency Musculoskeletal: denies pain, decreased motor or sensation  Vital signs in last 24 hours: [min-max] current  Temp:  [98.3 F (36.8 C)] 98.3 F (36.8 C) (12/14 0028) Pulse Rate:  [78-91] 85 (12/14 0334) Resp:  [14-24] 17 (12/14 0334) BP: (92-147)/(61-100) 144/93 (12/14 0334) SpO2:  [90 %-98 %] 92 % (12/14 0334)     Height: 5\' 4"  (162.6 cm) Weight: 81.6 kg     Intake/Output last 2 shifts:  12/13 0701 - 12/14 0700 In: 1896.4 [I.V.:1803.4; IV Piggyback:93] Out: -    Physical Exam:  Constitutional: alert, cooperative and no distress  HENT: normocephalic without obvious abnormality  Eyes: PERRL, EOM's grossly intact and symmetric  Respiratory: Open mouth breathing, on [place] Cardiovascular: regular rate and sinus rhythm  Gastrointestinal: She seems to be primarily tender over her bilateral lower chest/ribs but she does have some tenderness to the right abdomen, abdomen is soft non-distended Musculoskeletal: no edema or wounds, motor and sensation grossly intact, NT    Labs:  CBC Latest Ref Rng & Units 08/11/2021 08/10/2021 08/10/2021  WBC 4.0 - 10.5 K/uL 4.7 14.6(H) 12.3(H)   Hemoglobin 12.0 - 15.0 g/dL 11.9(L) 13.1 14.2  Hematocrit 36.0 - 46.0 % 38.2 43.8 48.1(H)  Platelets 150 - 400 K/uL 120(L) 166 252   CMP Latest Ref Rng & Units 08/11/2021 08/11/2021 08/10/2021  Glucose 70 - 99 mg/dL 14/07/2021) 509(T) -  BUN 8 - 23 mg/dL 14 13 -  Creatinine 26(Z - 1.00 mg/dL 1.24 5.80) 9.98(P)  Sodium 135 - 145 mmol/L 137 137 -  Potassium 3.5 - 5.1 mmol/L 4.9 5.3(H) -  Chloride 98 - 111 mmol/L 98 96(L) -  CO2 22 - 32 mmol/L 32 35(H) -  Calcium 8.9 - 10.3 mg/dL 8.2(L) 8.2(L) -  Total Protein 6.5 - 8.1 g/dL 7.1 - -  Total Bilirubin 0.3 - 1.2 mg/dL 1.0 - -  Alkaline Phos 38 - 126 U/L 66 - -  AST 15 - 41 U/L 149(H) - -  ALT 0 - 44 U/L 33 - -     Imaging studies: No new pertinent imaging studies   Assessment/Plan: (ICD-10's: K17.20) 65 y.o. female found to have cholelithiasis in setting of RUQ presenting to the hospital after being found unresponsive at home, complicated by pertinent comorbidities including hypercapnia with associated PNA, elevated ammonia level with possible underlying cirrhosis, and multiple comorbid conditions.   - It is very difficult to ascertain whether her pain is musculoskeletal in nature given tenderness over the bilateral lower ribs, worse on the right, although she does have some tenderness to the RUQ this morning. As such, I do feel it is pertinent to rule in/out gallbladder etiology as the source of her pain. We will get HIDA this morning to better evaluate her gallbladder and cystic duct  patency. .   - She will need to be NPO for HIDA   - Continue IV Abx (Zosyn) - Monitor abdominal examination   - Pain control prn (hold narcotics for HIDA); antiemetics pr - Further management per primary service; we will follow    All of the above findings and recommendations were discussed with the patient, and the medical team, and all of patient's questions were answered to her expressed satisfaction.  -- Edison Simon, PA-C [facility] 08/12/2021, 7:40 AM [phone] M-F: 7am - 4pm

## 2021-08-12 NOTE — Progress Notes (Signed)
08/12/21  Patient was able to get x-rays of her ribs this morning showing a right nondisplaced sixth rib fracture.  She also had a HIDA scan this morning which does not show acute cholecystitis.  At this point no surgical intervention is needed.  Would recommend appropriate pain control for her right rib fracture.  No surgical follow-up is needed.  Surgery team will sign off at this point but please feel free to consult if any concerns or questions.  Henrene Dodge, MD

## 2021-08-12 NOTE — Consult Note (Signed)
CARDIOLOGY CONSULT NOTE               Patient ID: Bethany Villa MRN: [id number] DOB/AGE: December 22, 1955 65 y.o.  Admit date: 08/10/2021 Referring Physician Dr. Lorella Nimrod Primary Cardiologist  none  Reason for Consultation elevated troponin, syncope   HPI: 65 year old female with a past medical history significant for COPD, chronic hypoxic and hypercapnic respiratory failure on home oxygen at 2 L, HFrEF (EF 40-45% 05/2021), chronic neck and back pain and fibromyalgia on chronic opiates who was brought in by EMS [facility] ED 08/11/2021 after being found unresponsive.  Cardiology consulted for elevated troponin and syncope.  Of note, pt had an history of hospitalization in February 2021 for unresponsiveness and believed to be secondary to noncompliance with home oxygen with combination of buprenorphine patch.  During interview today patient states she "just passed out after trying to do a regular breathing treatment" and she does not remember any events after that until waking up in the hospital.  She endorses generalized chest pain, generalized malaise, headache, back pain.  She denies shortness of breath, lower extremity edema.  She admits to smoking 1 pack/day of cigarettes.  She has not been seen by a cardiologist on an outpatient basis.  Telemetry reviewed today by me reveals normal sinus rhythm with ventricular rate of 68-78 bpm.  X-ray of right and left ribs today revealed no pleural effusions- acute nondisplaced fracture of the right lateral sixth rib. Improved bilateral upper lobe pneumonia.  Blood pressure 158/101 O2 sat 91 on 2 to 3 L of oxygen. Labs significant for troponin trending 642-544.  BNP 358.  Creatinine 0.99, EGFR greater than 60.  Platelets 120  Review of systems complete and found to be negative unless listed above   Past Medical History:  Diagnosis Date   Anxiety    Asthma    Chronic pain    lower back   Chronic systolic CHF (congestive heart failure) ([place])  09/22/2019   COPD (chronic obstructive pulmonary disease) ([facility])    Depression    DJD (degenerative joint disease)    Fibromyalgia    GERD (gastroesophageal reflux disease)    Headache    Hyperlipidemia    Lower extremity edema    On home oxygen therapy    has not been on since 2016   Panic attack    Shortness of breath dyspnea    Sleep disorder     Past Surgical History:  Procedure Laterality Date   ABDOMINAL SURGERY     ANKLE SURGERY     BACK SURGERY     lower   CARPAL TUNNEL RELEASE     CESAREAN SECTION     I & D KNEE WITH POLY EXCHANGE N/A 06/11/2020   Procedure: IRRIGATION AND DEBRIDEMENT KNEE WITH POLY EXCHANGE;  Surgeon: Corky Mull, MD;  Location: [facility] ORS;  Service: Orthopedics;  Laterality: N/A;   KNEE SURGERY     TONSILLECTOMY     TOTAL KNEE ARTHROPLASTY Left 04/14/2016   Procedure: TOTAL KNEE ARTHROPLASTY;  Surgeon: Earnestine Leys, MD;  Location: [facility] ORS;  Service: Orthopedics;  Laterality: Left;   TUBAL LIGATION      Medications Prior to Admission  Medication Sig Dispense Refill Last Dose   buprenorphine (BUTRANS) 7.5 MCG/HR Place 1 patch onto the skin once a week. For chronic pain syndrome 4 patch 2 Past Week   busPIRone (BUSPAR) 5 MG tablet Take 5 mg by mouth 3 (three) times daily.  (Patient not taking: Reported on 06/02/2021)  Not Taking   oxyCODONE (OXY IR/ROXICODONE) 5 MG immediate release tablet Take 1 tablet (5 mg total) by mouth every 6 (six) hours as needed for moderate pain (pain score 4-6). (Patient not taking: Reported on 06/02/2021) 20 tablet 0 Not Taking   Social History   Socioeconomic History   Marital status: Divorced    Spouse name: Not on file   Number of children: Not on file   Years of education: Not on file   Highest education level: Not on file  Occupational History   Occupation: disabled  Tobacco Use   Smoking status: Every Day    Packs/day: 1.00    Years: 45.00    Pack years: 45.00    Types: Cigarettes   Smokeless tobacco: Never   Vaping Use   Vaping Use: Never used  Substance and Sexual Activity   Alcohol use: Yes    Alcohol/week: 0.0 standard drinks    Comment: rare   Drug use: No   Sexual activity: Yes    Birth control/protection: Surgical  Other Topics Concern   Not on file  Social History Narrative   Not on file   Social Determinants of Health   Financial Resource Strain: Not on file  Food Insecurity: Not on file  Transportation Needs: Not on file  Physical Activity: Not on file  Stress: Not on file  Social Connections: Not on file  Intimate Partner Violence: Not on file    Family History  Problem Relation Age of Onset   Cancer Mother        beast     Review of systems complete and found to be negative unless listed above    PHYSICAL EXAM  General: Ill-appearing Caucasian female, in no acute distress.  Examined lying nearly flat in PCU bed. HEENT:  Normocephalic and atramatic Neck:  No JVD.  Lungs: expiratory wheezes bilaterally. Heart: HRRR . Normal S1 and S2 without gallops or murmurs.  Abdomen: Nondistended appearing Extremities: No clubbing, cyanosis or no lower extremity edema.   Neuro: Alert and oriented X 3. Psych: Anxious affect, responds appropriately  Labs:   Lab Results  Component Value Date   WBC 4.7 08/11/2021   HGB 11.9 (L) 08/11/2021   HCT 38.2 08/11/2021   MCV 93.4 08/11/2021   PLT 120 (L) 08/11/2021    Recent Labs  Lab 08/11/21 0516  NA 137  K 4.9  CL 98  CO2 32  BUN 14  CREATININE 0.99  CALCIUM 8.2*  PROT 7.1  BILITOT 1.0  ALKPHOS 66  ALT 33  AST 149*  GLUCOSE 149*   Lab Results  Component Value Date   CKTOTAL 28 (L) 09/01/2015   CKMB 3.4 07/26/2010   TROPONINI <0.03 10/01/2017    Lab Results  Component Value Date   CHOL 186 08/01/2015   Lab Results  Component Value Date   HDL 45 08/01/2015   Lab Results  Component Value Date   LDLCALC 122 (H) 08/01/2015   Lab Results  Component Value Date   TRIG 96 08/01/2015   TRIG 73  07/25/2014   Lab Results  Component Value Date   CHOLHDL 4.1 08/01/2015   No results found for: LDLDIRECT    Radiology: DG Ribs Unilateral Right  Result Date: 08/12/2021 CLINICAL DATA:  Right-greater-than-left rib pain. EXAM: LEFT RIBS AND CHEST - 3+ VIEW; RIGHT RIBS - 2 VIEW COMPARISON:  Chest x-ray dated August 10, 2021. FINDINGS: Acute nondisplaced fracture of the right lateral sixth rib. No left-sided rib fracture.  Stable cardiomediastinal silhouette. Patchy interstitial opacities in the left greater than right upper lobes have improved. No pleural effusion or pneumothorax. IMPRESSION: 1. Acute nondisplaced fracture of the right lateral sixth rib. 2. Improved bilateral upper lobe pneumonia. Electronically Signed   By: Titus Dubin M.D.   On: 08/12/2021 12:33   DG Ribs Unilateral W/Chest Left  Result Date: 08/12/2021 CLINICAL DATA:  Right-greater-than-left rib pain. EXAM: LEFT RIBS AND CHEST - 3+ VIEW; RIGHT RIBS - 2 VIEW COMPARISON:  Chest x-ray dated August 10, 2021. FINDINGS: Acute nondisplaced fracture of the right lateral sixth rib. No left-sided rib fracture. Stable cardiomediastinal silhouette. Patchy interstitial opacities in the left greater than right upper lobes have improved. No pleural effusion or pneumothorax. IMPRESSION: 1. Acute nondisplaced fracture of the right lateral sixth rib. 2. Improved bilateral upper lobe pneumonia. Electronically Signed   By: Titus Dubin M.D.   On: 08/12/2021 12:33   CT HEAD WO CONTRAST (5MM)  Result Date: 08/10/2021 CLINICAL DATA:  Mental status change. EXAM: CT HEAD WITHOUT CONTRAST TECHNIQUE: Contiguous axial images were obtained from the base of the skull through the vertex without intravenous contrast. COMPARISON:  Head CT dated October 07, 2019 FINDINGS: Brain: No evidence of acute infarction, hemorrhage, hydrocephalus, extra-axial collection or mass lesion/mass effect. Vascular: No hyperdense vessel or unexpected calcification.  Skull: Normal. Negative for fracture or focal lesion. Sinuses/Orbits: No acute finding. Other: None. IMPRESSION: No acute intracranial abnormality. Electronically Signed   By: Yetta Glassman M.D.   On: 08/10/2021 21:11   DG Chest Portable 1 View  Result Date: 08/10/2021 CLINICAL DATA:  Shortness of breath EXAM: PORTABLE CHEST 1 VIEW COMPARISON:  08/10/2021 FINDINGS: NG tube is in the stomach. Heart is normal size. Patchy opacities in the upper lobes, left greater than right. No effusions. No acute bony abnormality. IMPRESSION: Patchy opacities in the upper lobes, left greater than right concerning for pneumonia. Electronically Signed   By: Rolm Baptise M.D.   On: 08/10/2021 22:26   DG Chest Portable 1 View  Result Date: 08/10/2021 CLINICAL DATA:  Shortness of breath EXAM: PORTABLE CHEST 1 VIEW COMPARISON:  Chest x-ray dated June 27, 2020 FINDINGS: Cardiac and mediastinal contours are unchanged. Upper lung predominant heterogeneous opacities. No evidence of pleural effusion or pneumothorax. IMPRESSION: Upper lung predominant heterogeneous opacities, concerning for infection. Electronically Signed   By: Yetta Glassman M.D.   On: 08/10/2021 19:01   DG Abd Portable 1 View  Result Date: 08/10/2021 CLINICAL DATA:  Shortness of breath EXAM: PORTABLE ABDOMEN - 1 VIEW COMPARISON:  Chest x-ray 06/10/2020, CT 08/27/2011 FINDINGS: Esophageal tube looped over the epigastric area with tip projecting over distal stomach, side-port at the level of GE junction. There may be a small hiatal hernia. IMPRESSION: Esophageal tube is looped upon itself over the epigastric area with the tip projecting over the distal stomach Electronically Signed   By: Donavan Foil M.D.   On: 08/10/2021 22:28   US Abdomen Limited RUQ (LIVER/GB)  Result Date: 08/11/2021 CLINICAL DATA:  Increased ammonia levels EXAM: ULTRASOUND ABDOMEN LIMITED RIGHT UPPER QUADRANT COMPARISON:  No prior abdominal ultrasound, FINDINGS: Gallbladder:  Multiple gallstones, measuring up to 1.8 cm. No wall thickening or pericholecystic fluid. Negative Murphy sign. Common bile duct: Diameter: 4 mm Liver: Mild nodularity of the liver borders. No focal lesion identified. The liver is normal in echogenicity. Portal vein is patent on color Doppler imaging with normal direction of blood flow towards the liver. Other: Evaluation is somewhat limited as the patient  was not able to be put in the decubitus position. IMPRESSION: 1. Cholelithiasis without evidence of cholecystitis. 2. Mild hepatic nodularity, as can be seen with cirrhosis. Normal liver echogenicity. No focal lesion. Electronically Signed   By: Merilyn Baba M.D.   On: 08/11/2021 03:40    Telemetry: Normal sinus rhythm with ventricular rate of 68-78 bpm.  ASSESSMENT AND PLAN:  65 year old female with a past medical history significant for COPD, chronic hypoxic and hypercapnic respiratory failure on home oxygen at 2 L, HFrEF (EF 40-45% 05/2021), chronic neck and back pain and fibromyalgia on chronic opiates who was brought in by EMS [facility] ED 08/11/2021 after being found unresponsive.  Cardiology consulted for evaluation of elevated troponin and syncope.  #Elevated troponin #Syncope -Troponins trending 642-544, will recheck in the morning. -Echo performed today and result is pending.  Further recommendations based on results. -Keep patient on telemetry and will monitor for arrhythmias/abnormalities.  Patient is currently in normal sinus rhythm without any apparent conduction abnormality or pauses at this time.  #HFrEF (EF 40-45% 05/2021) -Follow echo results  #Elevated blood pressure -Elevated today to 163/90 this afternoon.  It does not appear patient is on any antihypertensives at home. -start losartan low dose today  Signed: Lina Sayre, PA-C 08/12/2021, 3:14 PM

## 2021-08-13 ENCOUNTER — Inpatient Hospital Stay
Admit: 2021-08-13 | Discharge: 2021-08-13 | Disposition: A | Discharge status: Home / Self Care | Payer: Medicare Other | Attending: Internal Medicine | Admitting: Internal Medicine

## 2021-08-13 LAB — ECHOCARDIOGRAM COMPLETE
AR max vel: 1.99 cm2
AV Area VTI: 2.17 cm2
AV Area mean vel: 2.18 cm2
AV Mean grad: 5 mmHg
AV Peak grad: 13.1 mmHg
Ao pk vel: 1.81 m/s
Area-P 1/2: 4.33 cm2
Height: 64 in
MV VTI: 2.01 cm2
S' Lateral: 2.23 cm
Weight: 2880 oz

## 2021-08-13 LAB — BRAIN NATRIURETIC PEPTIDE: B Natriuretic Peptide: 325.2 pg/mL — ABNORMAL HIGH (ref 0.0–100.0)

## 2021-08-13 LAB — TROPONIN I (HIGH SENSITIVITY)
Troponin I (High Sensitivity): 36 ng/L — ABNORMAL HIGH (ref <18)
Troponin I (High Sensitivity): 134 ng/L (ref <18)

## 2021-08-13 MED ORDER — LIDOCAINE 5 % EX PTCH: 1.0000 | MEDICATED_PATCH | CUTANEOUS | 0 refills | Status: DC

## 2021-08-13 MED ORDER — LOSARTAN POTASSIUM 50 MG PO TABS: 50.0000 mg | ORAL_TABLET | Freq: Every day | ORAL | 1 refills | Status: DC

## 2021-08-13 MED ORDER — LEVOFLOXACIN 750 MG PO TABS: 750.0000 mg | ORAL_TABLET | Freq: Every day | ORAL | 0 refills | Status: DC

## 2021-08-13 MED ORDER — LACTULOSE 10 GM/15ML PO SOLN: 20.0000 g | Freq: Two times a day (BID) | ORAL | 0 refills | Status: DC

## 2021-08-13 MED ORDER — ALPRAZOLAM 0.25 MG PO TABS
0.2500 mg | ORAL_TABLET | Freq: Once | ORAL | Status: AC
  Administered 2021-08-13: 0.25 mg via ORAL
  Filled 2021-08-13: qty 1

## 2021-08-13 MED ORDER — GUAIFENESIN-DM 100-10 MG/5ML PO SYRP: 5.0000 mL | ORAL_SOLUTION | ORAL | 0 refills | Status: AC | PRN

## 2021-08-13 MED ORDER — GUAIFENESIN-DM 100-10 MG/5ML PO SYRP: 5.0000 mL | ORAL_SOLUTION | ORAL | Status: DC | PRN

## 2021-08-13 MED ORDER — PANTOPRAZOLE SODIUM 40 MG PO TBEC: 40.0000 mg | DELAYED_RELEASE_TABLET | Freq: Every day | ORAL | 0 refills | Status: DC

## 2021-08-13 MED ORDER — PANTOPRAZOLE SODIUM 40 MG PO TBEC: 40.0000 mg | DELAYED_RELEASE_TABLET | Freq: Every day | ORAL | Status: DC

## 2021-08-13 MED ORDER — CLOPIDOGREL BISULFATE 75 MG PO TABS: 75.0000 mg | ORAL_TABLET | Freq: Every day | ORAL | 11 refills | Status: DC

## 2021-08-13 MED ORDER — PERFLUTREN LIPID MICROSPHERE
1.0000 mL | INTRAVENOUS | Status: AC | PRN
Start: 2021-08-13 — End: 2021-08-13
  Administered 2021-08-13: 3 mL via INTRAVENOUS
  Filled 2021-08-13: qty 10

## 2021-08-13 NOTE — Progress Notes (Signed)
*  PRELIMINARY RESULTS* Echocardiogram 2D Echocardiogram has been performed.  Bethany Villa 08/13/2021, 7:34 AM

## 2021-08-13 NOTE — Progress Notes (Addendum)
Nutrition Follow-up  DOCUMENTATION CODES:   Obesity unspecified  INTERVENTION:   -MVI with minerals daily  NUTRITION DIAGNOSIS:   Inadequate oral intake related to inability to eat as evidenced by NPO status.  Progressing- advanced to PO diet on 08/11/21  GOAL:   Patient will meet greater than or equal to 90% of their needs  Progressing   MONITOR:   Diet advancement, Labs, Weight trends, Skin, I & O's  REASON FOR ASSESSMENT:   Consult Assessment of nutrition requirement/status  ASSESSMENT:   65 yo F presenting to [facility] ED from home via EMS on 08/10/21 after being found by family unresponsive.  Upon EMS arrival patient was having difficult time breathing on her own, a total of 3 mg of Narcan were administered in the field with some minimal response.  Patient arrived in ED being actively bagged, no loss of pulses. Per ED documentation EMS reported that family was concerned that patient has used other peoples pain patches in the past  12/13- s/p BSE- advanced to regular diet with thin liquids 12/14- x-ray reveals rt non-displaced 6ht rib fracture  Reviewed I/O's: -199 ml x 24 hours and +3.1 L since admission  UOP: 1 L x 24 hours   Spoke with pt at bedside, who was eating breakfast at time of visit. RD assisted pt with cutting up food and opening packages, as pt reports this is difficult secondary to placement of pulse-ox monitor. Pt shares that her appetite has improvised since admission since starting on steroids. At baseline, she consumes 2 meals per day (Breakfast: eggs, breakfast meat, and pancakes, Dinner: sandwich or meat, starch, and vegetable). Noted meal completions 100%.  Pt denies any weight loss.   Discussed importance of good meal intake to promote healing. Pt with no further concerns at this time.   Medications reviewed and include lactulose and solu-medrol.   Labs reviewed: CBGS: 117-159 (inpatient orders for glycemic control are none).     Nutrition-Focused Physical Exam  Flowsheet Row Most Recent Value  Orbital Region No depletion  Upper Arm Region Mild depletion  Thoracic and Lumbar Region No depletion  Buccal Region No depletion  Temple Region No depletion  Clavicle Bone Region No depletion  Clavicle and Acromion Bone Region No depletion  Scapular Bone Region No depletion  Dorsal Hand No depletion  Patellar Region No depletion  Anterior Thigh Region No depletion  Posterior Calf Region No depletion  Edema (RD Assessment) None  Hair Reviewed  Eyes Reviewed  Mouth Reviewed  Skin Reviewed  Nails Reviewed        Diet Order:   Diet Order             Diet - low sodium heart healthy           Diet Heart Room service appropriate? Yes; Fluid consistency: Thin  Diet effective now                   EDUCATION NEEDS:   Not appropriate for education at this time  Skin:  Skin Assessment: Reviewed RN Assessment  Last BM:  08/11/21  Height:   Ht Readings from Last 1 Encounters:  08/12/21 5\' 4"  (1.626 m)    Weight:   Wt Readings from Last 1 Encounters:  08/11/21 81.6 kg    Ideal Body Weight:  54.5 kg  BMI:  Body mass index is 30.9 kg/m.  Estimated Nutritional Needs:   Kcal:  1700-1900  Protein:  95-110 grams  Fluid:  >1.7 L  Loistine Chance, RD, LDN, [place] Registered Dietitian II Certified Diabetes Care and Education Specialist Please refer to [facility] for RD and/or RD on-call/weekend/after hours pager

## 2021-08-13 NOTE — Progress Notes (Signed)
[facility] Cardiology    SUBJECTIVE: Patient states she still has significant back pain today however denies any pain in her chest.  She says she is doing a little bit better than yesterday.   Vitals:   08/13/21 1315 08/13/21 1335 08/13/21 1410 08/13/21 1524  BP:    (!) 162/92  Pulse:  99 100 100  Resp:  19 18 18   Temp:    97.9 F (36.6 C)  TempSrc:    Oral  SpO2: 91% 90% 93% 96%  Weight:      Height:         Intake/Output Summary (Last 24 hours) at 08/13/2021 1638 Last data filed at 08/13/2021 1300 Gross per 24 hour  Intake 480 ml  Output 1100 ml  Net -620 ml      PHYSICAL EXAM  General: Ill-appearing Caucasian female, in no acute distress.  Examined lying nearly flat in PCU bed. HEENT:  Normocephalic and atramatic Neck:   No JVD.  Lungs: expiratory wheezes bilaterally. Heart: HRRR . Normal S1 and S2 without gallops or murmurs.  Abdomen: Nondistended appearing Extremities: No clubbing, cyanosis or no lower extremity edema.   Neuro: Alert and oriented X 3. Psych: Anxious affect, responds appropriately   LABS: Basic Metabolic Panel: Recent Labs    08/11/21 0137 08/11/21 0516  NA 137 137  K 5.3* 4.9  CL 96* 98  CO2 35* 32  GLUCOSE 63* 149*  BUN 13 14  CREATININE 1.01* 0.99  CALCIUM 8.2* 8.2*  MG  --  2.3  PHOS  --  4.8*   Liver Function Tests: Recent Labs    08/10/21 1821 08/11/21 0516  AST 72* 149*  ALT 32 33  ALKPHOS 96 66  BILITOT 0.5 1.0  PROT 8.1 7.1  ALBUMIN 3.7 3.2*   No results for input(s): LIPASE, AMYLASE in the last 72 hours. CBC: Recent Labs    08/10/21 1821 08/10/21 2235 08/11/21 1444  WBC 12.3* 14.6* 4.7  NEUTROABS 9.3*  --   --   HGB 14.2 13.1 11.9*  HCT 48.1* 43.8 38.2  MCV 98.8 98.0 93.4  PLT 252 166 120*   Cardiac Enzymes: No results for input(s): CKTOTAL, CKMB, CKMBINDEX, TROPONINI in the last 72 hours. BNP: Invalid input(s): POCBNP D-Dimer: No results for input(s): DDIMER in the last 72 hours. Hemoglobin A1C: No  results for input(s): HGBA1C in the last 72 hours. Fasting Lipid Panel: No results for input(s): CHOL, HDL, LDLCALC, TRIG, CHOLHDL, LDLDIRECT in the last 72 hours. Thyroid Function Tests: No results for input(s): TSH, T4TOTAL, T3FREE, THYROIDAB in the last 72 hours.  Invalid input(s): FREET3 Anemia Panel: No results for input(s): VITAMINB12, FOLATE, FERRITIN, TIBC, IRON, RETICCTPCT in the last 72 hours.  DG Ribs Unilateral Right  Result Date: 08/12/2021 CLINICAL DATA:  Right-greater-than-left rib pain. EXAM: LEFT RIBS AND CHEST - 3+ VIEW; RIGHT RIBS - 2 VIEW COMPARISON:  Chest x-ray dated August 10, 2021. FINDINGS: Acute nondisplaced fracture of the right lateral sixth rib. No left-sided rib fracture. Stable cardiomediastinal silhouette. Patchy interstitial opacities in the left greater than right upper lobes have improved. No pleural effusion or pneumothorax. IMPRESSION: 1. Acute nondisplaced fracture of the right lateral sixth rib. 2. Improved bilateral upper lobe pneumonia. Electronically Signed   By: August 12, 2021 M.D.   On: 08/12/2021 12:33   DG Ribs Unilateral W/Chest Left  Result Date: 08/12/2021 CLINICAL DATA:  Right-greater-than-left rib pain. EXAM: LEFT RIBS AND CHEST - 3+ VIEW; RIGHT RIBS - 2 VIEW COMPARISON:  Chest  x-ray dated August 10, 2021. FINDINGS: Acute nondisplaced fracture of the right lateral sixth rib. No left-sided rib fracture. Stable cardiomediastinal silhouette. Patchy interstitial opacities in the left greater than right upper lobes have improved. No pleural effusion or pneumothorax. IMPRESSION: 1. Acute nondisplaced fracture of the right lateral sixth rib. 2. Improved bilateral upper lobe pneumonia. Electronically Signed   By: Obie Dredge M.D.   On: 08/12/2021 12:33   NM Hepatobiliary Liver Func  Result Date: 08/12/2021 CLINICAL DATA:  Right upper quadrant pain, cholelithiasis EXAM: NUCLEAR MEDICINE HEPATOBILIARY IMAGING TECHNIQUE: Sequential images of the  abdomen were obtained out to 60 minutes following intravenous administration of radiopharmaceutical. RADIOPHARMACEUTICALS:  5.29 mCi Tc-56m  Choletec IV COMPARISON:  08/11/2021 FINDINGS: Prompt uptake and biliary excretion of activity by the liver is seen. Gallbladder activity is visualized, consistent with patency of cystic duct. Biliary activity passes into small bowel, consistent with patent common bile duct. IMPRESSION: 1. Normal hepatobiliary scan.  No evidence of acute cholecystitis. Electronically Signed   By: Sharlet Salina M.D.   On: 08/12/2021 15:59   ECHOCARDIOGRAM COMPLETE  Result Date: 08/13/2021    ECHOCARDIOGRAM REPORT   Patient Name:   Bethany Villa Date of Exam: 08/13/2021 Medical Rec #:  [id number]      Height:       64.0 in Accession #:    [id number]     Weight:       180.0 lb Date of Birth:  Apr 14, 1956      BSA:          1.871 m Patient Age:    65 years       BP:           152/98 mmHg Patient Gender: F              HR:           73 bpm. Exam Location:  [facility] Procedure: 2D Echo, Color Doppler, Cardiac Doppler and Intracardiac            Opacification Agent Indications:     R94.31 Abnormal ECG  History:         Patient has prior history of Echocardiogram examinations. COPD;                  Risk Factors:Dyslipidemia.  Sonographer:     Humphrey Rolls Referring Phys:  [id number] AMIN Diagnosing Phys: Arnoldo Hooker MD  Sonographer Comments: Technically difficult study due to poor echo windows. Image acquisition challenging due to patient body habitus and Image acquisition challenging due to COPD. IMPRESSIONS  1. Left ventricular ejection fraction, by estimation, is 60 to 65%. The left ventricle has normal function. The left ventricle has no regional wall motion abnormalities. Left ventricular diastolic parameters were normal.  2. Right ventricular systolic function is normal. The right ventricular size is normal.  3. The mitral valve is normal in structure. Trivial mitral valve regurgitation.   4. The aortic valve is normal in structure. Aortic valve regurgitation is not visualized. FINDINGS  Left Ventricle: Left ventricular ejection fraction, by estimation, is 60 to 65%. The left ventricle has normal function. The left ventricle has no regional wall motion abnormalities. Definity contrast agent was given IV to delineate the left ventricular  endocardial borders. The left ventricular internal cavity size was normal in size. There is no left ventricular hypertrophy. Left ventricular diastolic parameters were normal. Right Ventricle: The right ventricular size is normal. No increase in right ventricular wall thickness. Right ventricular systolic  function is normal. Left Atrium: Left atrial size was normal in size. Right Atrium: Right atrial size was normal in size. Pericardium: There is no evidence of pericardial effusion. Mitral Valve: The mitral valve is normal in structure. Trivial mitral valve regurgitation. MV peak gradient, 6.7 mmHg. The mean mitral valve gradient is 3.0 mmHg. Tricuspid Valve: The tricuspid valve is normal in structure. Tricuspid valve regurgitation is trivial. Aortic Valve: The aortic valve is normal in structure. Aortic valve regurgitation is not visualized. Aortic valve mean gradient measures 5.0 mmHg. Aortic valve peak gradient measures 13.1 mmHg. Aortic valve area, by VTI measures 2.17 cm. Pulmonic Valve: The pulmonic valve was normal in structure. Pulmonic valve regurgitation is not visualized. Aorta: The aortic root and ascending aorta are structurally normal, with no evidence of dilitation. IAS/Shunts: No atrial level shunt detected by color flow Doppler.  LEFT VENTRICLE PLAX 2D LVIDd:         3.10 cm   Diastology LVIDs:         2.23 cm   LV e' medial:    6.74 cm/s LV PW:         1.08 cm   LV E/e' medial:  14.3 LV IVS:        0.92 cm   LV e' lateral:   7.83 cm/s LVOT diam:     2.10 cm   LV E/e' lateral: 12.3 LV SV:         70 LV SV Index:   37 LVOT Area:     3.46 cm  LEFT  ATRIUM         Index LA diam:    3.20 cm 1.71 cm/m  AORTIC VALVE                     PULMONIC VALVE AV Area (Vmax):    1.99 cm      PV Vmax:       1.11 m/s AV Area (Vmean):   2.18 cm      PV Vmean:      69.800 cm/s AV Area (VTI):     2.17 cm      PV VTI:        0.194 m AV Vmax:           181.00 cm/s   PV Peak grad:  4.9 mmHg AV Vmean:          107.000 cm/s  PV Mean grad:  2.0 mmHg AV VTI:            0.323 m AV Peak Grad:      13.1 mmHg AV Mean Grad:      5.0 mmHg LVOT Vmax:         104.00 cm/s LVOT Vmean:        67.500 cm/s LVOT VTI:          0.202 m LVOT/AV VTI ratio: 0.63  AORTA Ao Root diam: 2.80 cm MITRAL VALVE MV Area (PHT): 4.33 cm     SHUNTS MV Area VTI:   2.01 cm     Systemic VTI:  0.20 m MV Peak grad:  6.7 mmHg     Systemic Diam: 2.10 cm MV Mean grad:  3.0 mmHg MV Vmax:       1.29 m/s MV Vmean:      79.0 cm/s MV Decel Time: 175 msec MV E velocity: 96.40 cm/s MV A velocity: 117.00 cm/s MV E/A ratio:  0.82 Arnoldo Hooker MD Electronically signed by Arnoldo Hooker MD  Signature Date/Time: 08/13/2021/12:41:56 PM    Final      Echo  1. Left ventricular ejection fraction, by estimation, is 60 to 65%. The  left ventricle has normal function. The left ventricle has no regional  wall motion abnormalities. Left ventricular diastolic parameters were  normal.   2. Right ventricular systolic function is normal. The right ventricular  size is normal.   3. The mitral valve is normal in structure. Trivial mitral valve  regurgitation.   4. The aortic valve is normal in structure. Aortic valve regurgitation is  not visualized.   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  Cardiology consulted for evaluation of elevated troponin and syncope.   #Elevated troponin #Syncope -Troponins trending [phone] -Echo performed today resulted with normal LVEF 60-65%, trivial MR.  -There is no evidence of any telemetry arrhythmias/abnormalities on telemetry during the patient's stay.  Patient is currently in normal sinus rhythm without any apparent conduction abnormality or pauses at this time. -Patient will continue taking Plavix 75 mg once daily and follow-up in office with Dr. Juliann Pares in 1 week.   #HF with improved EF (EF 40-45% 05/2021 -improved to 60-65% today) -Echo performed today resulted with normal LVEF 60-65%, trivial MR.    #Elevated blood pressure -Continue losartan    Wonda Cerise, PA-C 08/13/2021 4:38 PM

## 2021-08-13 NOTE — Progress Notes (Signed)
PHARMACIST - PHYSICIAN COMMUNICATION  DR:   Nelson Chimes  CONCERNING: IV to Oral Route Change Policy  RECOMMENDATION: This patient is receiving Pantoprazole by the intravenous route.  Based on criteria approved by the Pharmacy and Therapeutics Committee, the intravenous medication(s) is/are being converted to the equivalent oral dose form(s).   DESCRIPTION: These criteria include: The patient is eating (either orally or via tube) and/or has been taking other orally administered medications for a least 24 hours The patient has no evidence of active gastrointestinal bleeding or impaired GI absorption (gastrectomy, short bowel, patient on TNA or NPO).  If you have questions about this conversion, please contact the Pharmacy Department  []   [phone] )  ( [phone] [x]   [phone] )  [facility] []   [phone] )  [facility] []   [phone] )  [facility] []   [phone] )  [facility]   Tyreque Finken Rodriguez-Guzman PharmD, BCPS 08/13/2021 3:40 PM

## 2021-08-13 NOTE — Progress Notes (Signed)
Patient refused to go on BIPAP machine tonight. She states she does not wear a machine at home and does not feel like her breathing is bad tonight. I explained to patient that I would chart that she refused to go on BIPAP machine and patient informed me that she did not care and I am upsetting her. I informed covering RN.

## 2021-08-13 NOTE — Progress Notes (Signed)
Occupational Therapy Treatment Patient Details Name: Bethany Villa MRN: [id number] DOB: Feb 27, 1956 Today's Date: 08/13/2021   History of present illness 65 y.o. female with medical history significant for COPD, chronic hypoxic and hypercapnic respiratory failure (on home O2 at 2 L), systolic CHF, chronic neck and back pain and fibromyalgia on chronic opiates, who presents by EMS due to being found unresponsive. Pt admitted for managementof acute/chronic respiratory failure with hypercapnia, acute metabolic encephalopathy. Per imaging, noted with cholelithiasis without evidence of cholecystitis.   OT comments  Pt seen for OT tx. Pt very hesitant to get EOB/OOB 2/2 R rib pain but with encouragement performed with modified independence, SBA for ADL transfers + VC for hand placement/RW mgt. Pt tolerated side steps EOB with SBA, endorsing B calf muscles felt tight. OT assessed. Not red or swollen, no pain to touch. Pt performed grooming tasks EOB with set up and supv, assist for combing out tangles in hair. Pt progressing, however, remains very pain limited and endorses anxiety and feeling SOB despite VSS. Pt instructed in energy conservation strategies including activity pacing and PLB to support breath recovery. Pt able to return demo proper technique. Continue to recommend HHOT.    Recommendations for follow up therapy are one component of a multi-disciplinary discharge planning process, led by the attending physician.  Recommendations may be updated based on patient status, additional functional criteria and insurance authorization.    Follow Up Recommendations  Home health OT    Assistance Recommended at Discharge PRN  Equipment Recommendations  Other (comment) (reacher)    Recommendations for Other Services      Precautions / Restrictions Precautions Precautions: Fall Restrictions Weight Bearing Restrictions: No       Mobility Bed Mobility Overal bed mobility: Modified  Independent Bed Mobility: Supine to Sit;Sit to Supine     Supine to sit: Modified independent (Device/Increase time) Sit to supine: Modified independent (Device/Increase time)   General bed mobility comments: increased time/effort, discomfort 2/2 rib pain, feeling anxious    Transfers Overall transfer level: Needs assistance Equipment used: Rolling walker (2 wheels) Transfers: Sit to/from Stand Sit to Stand: Supervision;Min guard           General transfer comment: SBA + VC for hand placement     Balance Overall balance assessment: Mild deficits observed, not formally tested                                         ADL either performed or assessed with clinical judgement   ADL       Grooming: Sitting;Brushing hair;Applying deodorant;Oral care;Wash/dry face;Set up;Supervision/safety Grooming Details (indicate cue type and reason): Max A to comb out significant tangles while pt was sitting EOB 2/2 pt feeling SOB and having R rib pain making it difficult to reach the back of her head to comb. Set up for applying deodorant, washing face, brushing teeth.                             Functional mobility during ADLs: Min guard;Rolling walker (2 wheels)      Extremity/Trunk Assessment              Vision       Perception     Praxis      Cognition Arousal/Alertness: Awake/alert Behavior During Therapy: Anxious Overall Cognitive Status: Within Functional Limits  for tasks assessed                                            Exercises Other Exercises Other Exercises: Pt instructed in energy conservation strategies including activity pacing and PLB to support breath recovery. Pt able to return demo proper technique.   Shoulder Instructions       General Comments SpO2 on 3L 91-96%, improved with PLB, HR up to 120's with exertion    Pertinent Vitals/ Pain       Pain Assessment: 0-10 Pain Score: 10-Worst pain  ever Pain Location: R ribs Pain Descriptors / Indicators: Aching;Grimacing;Guarding;Moaning Pain Intervention(s): Limited activity within patient's tolerance;Monitored during session;Premedicated before session;Repositioned;Patient requesting pain meds-RN notified  Home Living                                          Prior Functioning/Environment              Frequency  Min 2X/week        Progress Toward Goals  OT Goals(current goals can now be found in the care plan section)  Progress towards OT goals: Progressing toward goals  Acute Rehab OT Goals Patient Stated Goal: to have less pain OT Goal Formulation: With patient Time For Goal Achievement: 08/25/21 Potential to Achieve Goals: Good  Plan Discharge plan remains appropriate;Frequency remains appropriate    Co-evaluation                 AM-PAC OT "6 Clicks" Daily Activity     Outcome Measure   Help from another person eating meals?: None Help from another person taking care of personal grooming?: A Little Help from another person toileting, which includes using toliet, bedpan, or urinal?: A Little Help from another person bathing (including washing, rinsing, drying)?: A Little Help from another person to put on and taking off regular upper body clothing?: None Help from another person to put on and taking off regular lower body clothing?: None 6 Click Score: 21    End of Session Equipment Utilized During Treatment: Rolling walker (2 wheels);Oxygen  OT Visit Diagnosis: Pain;Muscle weakness (generalized) (M62.81) Pain - Right/Left: Right Pain - part of body:  (R ribs)   Activity Tolerance Patient limited by pain   Patient Left in bed;with call bell/phone within reach;with bed alarm set   Nurse Communication Patient requests pain meds (meds for anxiety)        Time: 0459-9774 OT Time Calculation (min): 40 min  Charges: OT General Charges $OT Visit: 1 Visit OT  Treatments $Self Care/Home Management : 38-52 mins  Arman Filter., MPH, MS, OTR/L ascom [phone] 08/13/21, 12:55 PM

## 2021-08-13 NOTE — Discharge Summary (Addendum)
Physician Discharge Summary  Bethany Villa XBJ:[id number] DOB: [birth year] DOA: 08/10/2021  PCP: [name], MD  Admit date: 08/10/2021 Discharge date: 08/13/2021  Admitted From: Home Disposition: Home  Recommendations for Outpatient Follow-up:  Follow up with PCP in 1-2 weeks Follow-up with cardiology in 1 week Please obtain BMP/CBC in one week Please follow up on the following pending results: None  Home Health: No Equipment/Devices: Home oxygen Discharge Condition: Stable CODE STATUS: Full Diet recommendation: Heart Healthy / Carb Modified   Brief/Interim Summary: Bethany Villa is a 65 y.o. female with medical history significant for COPD, chronic hypoxic and hypercapnic respiratory failure on home O2 at 2 L, previously a candidate for home trilogy machine, systolic CHF (EF 40 to 45% 05/2019), chronic neck and back pain and fibromyalgia on chronic opiates, who presents by EMS due to being found unresponsive. Patient had an history of hospitalization in February 2021 for unresponsiveness and believed to be secondary to noncompliance with home oxygen with combination of buprenorphine patch. Patient received Narcan twice with minimal response. ABG with hypoxia and hypercapnia, she was placed on BiPAP with improvement in mental status. Labs pertinent for mild leukocytosis, hyperkalemia with potassium of 5.9, AKI and ammonia of 136.  COVID-19 and flu PCR negative.  UA and UDS without any significant abnormality. Chest x-ray with heterogeneous opacities concerning for infection.  CT head without any acute abnormality. Right upper quadrant ultrasound with concern of liver cirrhosis and cholelithiasis with no cholecystitis. General surgery was also consulted due to her complaint of right upper quadrant pain.  HIDA scan was done and it was negative for any acute cholecystitis.  Rib cage x-ray shows a known displaced acute fracture of lateral sixth rib.  Most likely secondary to  fall.  Patient was given lidocaine patch for conservative management and will follow-up with her primary care provider.  She was also found to have elevated troponin and cardiology was consulted.  No chest pain.  They do not think that there is any cardiac cause of her syncopal episode.  They were recommending outpatient follow-up.  Repeat echocardiogram with normal EF and no diastolic dysfunction.  She was started on Plavix as recommended by cardiology and will follow-up with them for further management.  She was also complaining of frequent choking on food.  We obtain swallow evaluation and they did not found any significant risk for aspiration.  They were recommending aspiration precautions only.  She received Zosyn for concern of pneumonia and discharged on Levaquin.  She will follow-up with her primary care provider and need a repeat chest x-ray in 4 to 6 weeks to see the resolution.  As her right upper quadrant ultrasound with concern of early liver cirrhosis.  She was started on lactulose due to elevated ammonia levels as they might be contributory to her altered mental status.  She can titrate the dose to have 2-3 soft bowel movements.  She needs to follow-up with a gastroenterologist for further recommendations.  Her PCP should be able to provide the referral.  Patient received Lokelma for hyperkalemia.  Potassium within normal limit on discharge.  Patient will continue rest of her home medications except the changes as mentioned above and follow-up with her providers.  Discharge Diagnoses:  Principal Problem:   Acute metabolic encephalopathy Active Problems:   COPD (chronic obstructive pulmonary disease) (HCC)   Depression   Acute on chronic respiratory failure with hypercapnia (HCC)   Chronic systolic CHF (congestive heart failure) (HCC)   Altered mental  status   Episode of unresponsiveness   Hyperammonemia (HCC)   Chronic, continuous use of opioids   AKI (acute kidney injury)  (HCC)   Hyperkalemia   Discharge Instructions  Discharge Instructions     Diet - low sodium heart healthy   Complete by: As directed    Discharge instructions   Complete by: As directed    It was pleasure taking care of you. Your cardiologist started you on Plavix and losartan.  Please take it as directed and follow-up with them within a week for further recommendations. You are also being given lidocaine patch to be used on your right lower rib cage for pain. You are also being given antibiotics for 4 more days for your pneumonia. You are also being given lactulose, you can titrate the dose to have 2 soft bowel movements daily. Your ultrasound was concerning for early cirrhosis please follow-up closely with your gastroenterologist, your primary care doctor should be able to provide referral. Please keep yourself well-hydrated and follow-up with your doctors.   Increase activity slowly   Complete by: As directed       Allergies as of 08/13/2021       Reactions   Gabapentin Shortness Of Breath, Rash   Mobic [meloxicam] Anaphylaxis   Penicillins Shortness Of Breath, Swelling   Tolerated Zosyn December 2022 Has patient had a PCN reaction causing immediate rash, facial/tongue/throat swelling, SOB or lightheadedness with hypotension: unknown Has patient had a PCN reaction causing severe rash involving mucus membranes or skin necrosis: {unknown Has patient had a PCN reaction that required hospitalization {unknown Has patient had a PCN reaction occurring within the last 10 years: no If all of the above answers are "NO", then may proceed with Cephalosporin use.   Sulfa Antibiotics Shortness Of Breath, Swelling        Medication List     STOP taking these medications    oxyCODONE 5 MG immediate release tablet Commonly known as: Oxy IR/ROXICODONE       TAKE these medications    buprenorphine 7.5 MCG/HR Commonly known as: Butrans Place 1 patch onto the skin once a week.  For chronic pain syndrome   busPIRone 5 MG tablet Commonly known as: BUSPAR Take 5 mg by mouth 3 (three) times daily.   clopidogrel 75 MG tablet Commonly known as: Plavix Take 1 tablet (75 mg total) by mouth daily.   guaiFENesin-dextromethorphan 100-10 MG/5ML syrup Commonly known as: ROBITUSSIN DM Take 5 mLs by mouth every 4 (four) hours as needed for cough.   lactulose 10 GM/15ML solution Commonly known as: CHRONULAC Take 30 mLs (20 g total) by mouth 2 (two) times daily.   levofloxacin 750 MG tablet Commonly known as: Levaquin Take 1 tablet (750 mg total) by mouth daily for 4 days.   lidocaine 5 % Commonly known as: LIDODERM Place 1 patch onto the skin daily. Remove & Discard patch within 12 hours or as directed by MD   losartan 50 MG tablet Commonly known as: COZAAR Take 1 tablet (50 mg total) by mouth daily. Start taking on: August 14, 2021   pantoprazole 40 MG tablet Commonly known as: PROTONIX Take 1 tablet (40 mg total) by mouth daily. Start taking on: August 14, 2021        Follow-up Information     Alwyn Pea, MD. Go on 09/02/2021.   Specialties: Cardiology, Internal Medicine Why: 1-2 weeks after discharge..@ 9:30am Contact information: [address] [place] [address] [phone]  [name], MD. Schedule an appointment as soon as possible for a visit on 08/19/2021.   Specialty: Physician Assistant Why: @ 10:25am Contact information: [address] [facility] [place] [address] [phone]                Allergies  Allergen Reactions   Gabapentin Shortness Of Breath and Rash   Mobic [Meloxicam] Anaphylaxis   Penicillins Shortness Of Breath and Swelling    Tolerated Zosyn December 2022  Has patient had a PCN reaction causing immediate rash, facial/tongue/throat swelling, SOB or lightheadedness with hypotension: unknown Has patient had a PCN reaction causing severe rash  involving mucus membranes or skin necrosis: {unknown Has patient had a PCN reaction that required hospitalization {unknown Has patient had a PCN reaction occurring within the last 10 years: no If all of the above answers are "NO", then may proceed with Cephalosporin use.   Sulfa Antibiotics Shortness Of Breath and Swelling    Consultations: Cardiology General surgery  Procedures/Studies: DG Ribs Unilateral Right  Result Date: 08/12/2021 CLINICAL DATA:  Right-greater-than-left rib pain. EXAM: LEFT RIBS AND CHEST - 3+ VIEW; RIGHT RIBS - 2 VIEW COMPARISON:  Chest x-ray dated August 10, 2021. FINDINGS: Acute nondisplaced fracture of the right lateral sixth rib. No left-sided rib fracture. Stable cardiomediastinal silhouette. Patchy interstitial opacities in the left greater than right upper lobes have improved. No pleural effusion or pneumothorax. IMPRESSION: 1. Acute nondisplaced fracture of the right lateral sixth rib. 2. Improved bilateral upper lobe pneumonia. Electronically Signed   By: Obie Dredge M.D.   On: 08/12/2021 12:33   DG Ribs Unilateral W/Chest Left  Result Date: 08/12/2021 CLINICAL DATA:  Right-greater-than-left rib pain. EXAM: LEFT RIBS AND CHEST - 3+ VIEW; RIGHT RIBS - 2 VIEW COMPARISON:  Chest x-ray dated August 10, 2021. FINDINGS: Acute nondisplaced fracture of the right lateral sixth rib. No left-sided rib fracture. Stable cardiomediastinal silhouette. Patchy interstitial opacities in the left greater than right upper lobes have improved. No pleural effusion or pneumothorax. IMPRESSION: 1. Acute nondisplaced fracture of the right lateral sixth rib. 2. Improved bilateral upper lobe pneumonia. Electronically Signed   By: Obie Dredge M.D.   On: 08/12/2021 12:33   CT HEAD WO CONTRAST ( )  Result Date: 08/10/2021 CLINICAL DATA:  Mental status change. EXAM: CT HEAD WITHOUT CONTRAST TECHNIQUE: Contiguous axial images were obtained from the base of the skull through  the vertex without intravenous contrast. COMPARISON:  Head CT dated October 07, 2019 FINDINGS: Brain: No evidence of acute infarction, hemorrhage, hydrocephalus, extra-axial collection or mass lesion/mass effect. Vascular: No hyperdense vessel or unexpected calcification. Skull: Normal. Negative for fracture or focal lesion. Sinuses/Orbits: No acute finding. Other: None. IMPRESSION: No acute intracranial abnormality. Electronically Signed   By: Allegra Lai M.D.   On: 08/10/2021 21:11   NM Hepatobiliary Liver Func  Result Date: 08/12/2021 CLINICAL DATA:  Right upper quadrant pain, cholelithiasis EXAM: NUCLEAR MEDICINE HEPATOBILIARY IMAGING TECHNIQUE: Sequential images of the abdomen were obtained out to 60 minutes following intravenous administration of radiopharmaceutical. RADIOPHARMACEUTICALS:  5.29 mCi Tc-54m  Choletec IV COMPARISON:  08/11/2021 FINDINGS: Prompt uptake and biliary excretion of activity by the liver is seen. Gallbladder activity is visualized, consistent with patency of cystic duct. Biliary activity passes into small bowel, consistent with patent common bile duct. IMPRESSION: 1. Normal hepatobiliary scan.  No evidence of acute cholecystitis. Electronically Signed   By: Sharlet Salina M.D.   On: 08/12/2021 15:59   DG Chest Portable 1  View  Result Date: 08/10/2021 CLINICAL DATA:  Shortness of breath EXAM: PORTABLE CHEST 1 VIEW COMPARISON:  08/10/2021 FINDINGS: NG tube is in the stomach. Heart is normal size. Patchy opacities in the upper lobes, left greater than right. No effusions. No acute bony abnormality. IMPRESSION: Patchy opacities in the upper lobes, left greater than right concerning for pneumonia. Electronically Signed   By: Charlett Nose M.D.   On: 08/10/2021 22:26   DG Chest Portable 1 View  Result Date: 08/10/2021 CLINICAL DATA:  Shortness of breath EXAM: PORTABLE CHEST 1 VIEW COMPARISON:  Chest x-ray dated June 27, 2020 FINDINGS: Cardiac and mediastinal contours are  unchanged. Upper lung predominant heterogeneous opacities. No evidence of pleural effusion or pneumothorax. IMPRESSION: Upper lung predominant heterogeneous opacities, concerning for infection. Electronically Signed   By: Allegra Lai M.D.   On: 08/10/2021 19:01   DG Abd Portable 1 View  Result Date: 08/10/2021 CLINICAL DATA:  Shortness of breath EXAM: PORTABLE ABDOMEN - 1 VIEW COMPARISON:  Chest x-ray 06/10/2020, CT 08/27/2011 FINDINGS: Esophageal tube looped over the epigastric area with tip projecting over distal stomach, side-port at the level of GE junction. There may be a small hiatal hernia. IMPRESSION: Esophageal tube is looped upon itself over the epigastric area with the tip projecting over the distal stomach Electronically Signed   By: Jasmine Pang M.D.   On: 08/10/2021 22:28   ECHOCARDIOGRAM COMPLETE  Result Date: 08/13/2021    ECHOCARDIOGRAM REPORT   Patient Name:   Bethany Villa Date of Exam: 08/13/2021 Medical Rec #:  [id number]      Height:       64.0 in Accession #:    [id number]     Weight:       180.0 lb Date of Birth:  07-Apr-1956      BSA:          1.871 m Patient Age:    65 years       BP:           152/98 mmHg Patient Gender: F              HR:           73 bpm. Exam Location:  [facility] Procedure: 2D Echo, Color Doppler, Cardiac Doppler and Intracardiac            Opacification Agent Indications:     R94.31 Abnormal ECG  History:         Patient has prior history of Echocardiogram examinations. COPD;                  Risk Factors:Dyslipidemia.  Sonographer:     Humphrey Rolls Referring Phys:  [id number] Kaidin Boehle Diagnosing Phys: Arnoldo Hooker MD  Sonographer Comments: Technically difficult study due to poor echo windows. Image acquisition challenging due to patient body habitus and Image acquisition challenging due to COPD. IMPRESSIONS  1. Left ventricular ejection fraction, by estimation, is 60 to 65%. The left ventricle has normal function. The left ventricle has no regional wall  motion abnormalities. Left ventricular diastolic parameters were normal.  2. Right ventricular systolic function is normal. The right ventricular size is normal.  3. The mitral valve is normal in structure. Trivial mitral valve regurgitation.  4. The aortic valve is normal in structure. Aortic valve regurgitation is not visualized. FINDINGS  Left Ventricle: Left ventricular ejection fraction, by estimation, is 60 to 65%. The left ventricle has normal function. The left ventricle has no regional wall motion  abnormalities. Definity contrast agent was given IV to delineate the left ventricular  endocardial borders. The left ventricular internal cavity size was normal in size. There is no left ventricular hypertrophy. Left ventricular diastolic parameters were normal. Right Ventricle: The right ventricular size is normal. No increase in right ventricular wall thickness. Right ventricular systolic function is normal. Left Atrium: Left atrial size was normal in size. Right Atrium: Right atrial size was normal in size. Pericardium: There is no evidence of pericardial effusion. Mitral Valve: The mitral valve is normal in structure. Trivial mitral valve regurgitation. MV peak gradient, 6.7 mmHg. The mean mitral valve gradient is 3.0 mmHg. Tricuspid Valve: The tricuspid valve is normal in structure. Tricuspid valve regurgitation is trivial. Aortic Valve: The aortic valve is normal in structure. Aortic valve regurgitation is not visualized. Aortic valve mean gradient measures 5.0 mmHg. Aortic valve peak gradient measures 13.1 mmHg. Aortic valve area, by VTI measures 2.17 cm. Pulmonic Valve: The pulmonic valve was normal in structure. Pulmonic valve regurgitation is not visualized. Aorta: The aortic root and ascending aorta are structurally normal, with no evidence of dilitation. IAS/Shunts: No atrial level shunt detected by color flow Doppler.  LEFT VENTRICLE PLAX 2D LVIDd:         3.10 cm   Diastology LVIDs:         2.23 cm    LV e' medial:    6.74 cm/s LV PW:         1.08 cm   LV E/e' medial:  14.3 LV IVS:        0.92 cm   LV e' lateral:   7.83 cm/s LVOT diam:     2.10 cm   LV E/e' lateral: 12.3 LV SV:         70 LV SV Index:   37 LVOT Area:     3.46 cm  LEFT ATRIUM         Index LA diam:    3.20 cm 1.71 cm/m  AORTIC VALVE                     PULMONIC VALVE AV Area (Vmax):    1.99 cm      PV Vmax:       1.11 m/s AV Area (Vmean):   2.18 cm      PV Vmean:      69.800 cm/s AV Area (VTI):     2.17 cm      PV VTI:        0.194 m AV Vmax:           181.00 cm/s   PV Peak grad:  4.9 mmHg AV Vmean:          107.000 cm/s  PV Mean grad:  2.0 mmHg AV VTI:            0.323 m AV Peak Grad:      13.1 mmHg AV Mean Grad:      5.0 mmHg LVOT Vmax:         104.00 cm/s LVOT Vmean:        67.500 cm/s LVOT VTI:          0.202 m LVOT/AV VTI ratio: 0.63  AORTA Ao Root diam: 2.80 cm MITRAL VALVE MV Area (PHT): 4.33 cm     SHUNTS MV Area VTI:   2.01 cm     Systemic VTI:  0.20 m MV Peak grad:  6.7 mmHg     Systemic Diam: 2.10  cm MV Mean grad:  3.0 mmHg MV Vmax:       1.29 m/s MV Vmean:      79.0 cm/s MV Decel Time: 175 msec MV E velocity: 96.40 cm/s MV A velocity: 117.00 cm/s MV E/A ratio:  0.82 Arnoldo Hooker MD Electronically signed by Arnoldo Hooker MD Signature Date/Time: 08/13/2021/12:41:56 PM    Final    US Abdomen Limited RUQ (LIVER/GB)  Result Date: 08/11/2021 CLINICAL DATA:  Increased ammonia levels EXAM: ULTRASOUND ABDOMEN LIMITED RIGHT UPPER QUADRANT COMPARISON:  No prior abdominal ultrasound, FINDINGS: Gallbladder: Multiple gallstones, measuring up to 1.8 cm. No wall thickening or pericholecystic fluid. Negative Murphy sign. Common bile duct: Diameter: 4 mm Liver: Mild nodularity of the liver borders. No focal lesion identified. The liver is normal in echogenicity. Portal vein is patent on color Doppler imaging with normal direction of blood flow towards the liver. Other: Evaluation is somewhat limited as the patient was not able to be put  in the decubitus position. IMPRESSION: 1. Cholelithiasis without evidence of cholecystitis. 2. Mild hepatic nodularity, as can be seen with cirrhosis. Normal liver echogenicity. No focal lesion. Electronically Signed   By: Wiliam Ke M.D.   On: 08/11/2021 03:40    Subjective: Patient was seen and examined today.  Denies any chest pain.  Continue to have right lower rib cage pain.  Eating and drinking okay.  Ready to go home.  Discharge Exam: Vitals:   08/13/21 1410 08/13/21 1524  BP:  (!) 162/92  Pulse: 100 100  Resp: 18 18  Temp:  97.9 F (36.6 C)  SpO2: 93% 96%   Vitals:   08/13/21 1315 08/13/21 1335 08/13/21 1410 08/13/21 1524  BP:    (!) 162/92  Pulse:  99 100 100  Resp:  19 18 18   Temp:    97.9 F (36.6 C)  TempSrc:    Oral  SpO2: 91% 90% 93% 96%  Weight:      Height:        General: Pt is alert, awake, not in acute distress Cardiovascular: RRR, S1/S2 +, no rubs, no gallops Respiratory: CTA bilaterally, no wheezing, no rhonchi Abdominal: Soft, NT, ND, bowel sounds + Extremities: no edema, no cyanosis   The results of significant diagnostics from this hospitalization (including imaging, microbiology, ancillary and laboratory) are listed below for reference.    Microbiology: Recent Results (from the past 240 hour(s))  Culture, blood (routine x 2) Call MD if unable to obtain prior to antibiotics being given     Status: None (Preliminary result)   Collection Time: 08/10/21 10:35 PM   Specimen: BLOOD LEFT FOREARM  Result Value Ref Range Status   Specimen Description BLOOD LEFT FOREARM  Final   Special Requests   Final    BOTTLES DRAWN AEROBIC AND ANAEROBIC Blood Culture results may not be optimal due to an inadequate volume of blood received in culture bottles   Culture   Final    NO GROWTH 3 DAYS Performed at [facility], [address], [place], [place] [address]    Report Status PENDING  Incomplete  Culture, blood (routine x 2) Call MD if unable to  obtain prior to antibiotics being given     Status: None (Preliminary result)   Collection Time: 08/10/21 10:40 PM   Specimen: BLOOD LEFT FOREARM  Result Value Ref Range Status   Specimen Description BLOOD LEFT FOREARM  Final   Special Requests   Final    BOTTLES DRAWN AEROBIC AND ANAEROBIC  Blood Culture results may not be optimal due to an inadequate volume of blood received in culture bottles   Culture   Final    NO GROWTH 3 DAYS Performed at [facility], [address] [place], [place] [address]    Report Status PENDING  Incomplete  Resp Panel by RT-PCR (Flu A&B, Covid) Nasopharyngeal Swab     Status: None   Collection Time: 08/11/21  5:29 AM   Specimen: Nasopharyngeal Swab; Nasopharyngeal(NP) swabs in vial transport medium  Result Value Ref Range Status   SARS Coronavirus 2 by RT PCR NEGATIVE NEGATIVE Final    Comment: (NOTE) SARS-CoV-2 target nucleic acids are NOT DETECTED.  The SARS-CoV-2 RNA is generally detectable in upper respiratory specimens during the acute phase of infection. The lowest concentration of SARS-CoV-2 viral copies this assay can detect is 138 copies/mL. A negative result does not preclude SARS-Cov-2 infection and should not be used as the sole basis for treatment or other patient management decisions. A negative result may occur with  improper specimen collection/handling, submission of specimen other than nasopharyngeal swab, presence of viral mutation(s) within the areas targeted by this assay, and inadequate number of viral copies(<138 copies/mL). A negative result must be combined with clinical observations, patient history, and epidemiological information. The expected result is Negative.  Fact Sheet for Patients:  BloggerCourse.com  Fact Sheet for Healthcare Providers:  SeriousBroker.it  This test is no t yet approved or cleared by the [place] FDA and  has been authorized for  detection and/or diagnosis of SARS-CoV-2 by FDA under an Emergency Use Authorization (EUA). This EUA will remain  in effect (meaning this test can be used) for the duration of the COVID-19 declaration under Section 564(b)(1) of the Act, 21 U.S.C.section 360bbb-3(b)(1), unless the authorization is terminated  or revoked sooner.       Influenza A by PCR NEGATIVE NEGATIVE Final   Influenza B by PCR NEGATIVE NEGATIVE Final    Comment: (NOTE) The Xpert Xpress SARS-CoV-2/FLU/RSV plus assay is intended as an aid in the diagnosis of influenza from Nasopharyngeal swab specimens and should not be used as a sole basis for treatment. Nasal washings and aspirates are unacceptable for Xpert Xpress SARS-CoV-2/FLU/RSV testing.  Fact Sheet for Patients: BloggerCourse.com  Fact Sheet for Healthcare Providers: SeriousBroker.it  This test is not yet approved or cleared by the [place] FDA and has been authorized for detection and/or diagnosis of SARS-CoV-2 by FDA under an Emergency Use Authorization (EUA). This EUA will remain in effect (meaning this test can be used) for the duration of the COVID-19 declaration under Section 564(b)(1) of the Act, 21 U.S.C. section 360bbb-3(b)(1), unless the authorization is terminated or revoked.  Performed at [facility], [address] [place], [place] [address]   Respiratory (~20 pathogens) panel by PCR     Status: None   Collection Time: 08/11/21  5:29 AM   Specimen: Nasopharyngeal Swab; Respiratory  Result Value Ref Range Status   Adenovirus NOT DETECTED NOT DETECTED Final   Coronavirus 229E NOT DETECTED NOT DETECTED Final    Comment: (NOTE) The Coronavirus on the Respiratory Panel, DOES NOT test for the novel  Coronavirus (2019 nCoV)    Coronavirus HKU1 NOT DETECTED NOT DETECTED Final   Coronavirus NL63 NOT DETECTED NOT DETECTED Final   Coronavirus OC43 NOT DETECTED NOT DETECTED Final    Metapneumovirus NOT DETECTED NOT DETECTED Final   Rhinovirus / Enterovirus NOT DETECTED NOT DETECTED Final   Influenza A NOT DETECTED  NOT DETECTED Final   Influenza B NOT DETECTED NOT DETECTED Final   Parainfluenza Virus 1 NOT DETECTED NOT DETECTED Final   Parainfluenza Virus 2 NOT DETECTED NOT DETECTED Final   Parainfluenza Virus 3 NOT DETECTED NOT DETECTED Final   Parainfluenza Virus 4 NOT DETECTED NOT DETECTED Final   Respiratory Syncytial Virus NOT DETECTED NOT DETECTED Final   Bordetella pertussis NOT DETECTED NOT DETECTED Final   Bordetella Parapertussis NOT DETECTED NOT DETECTED Final   Chlamydophila pneumoniae NOT DETECTED NOT DETECTED Final   Mycoplasma pneumoniae NOT DETECTED NOT DETECTED Final    Comment: Performed at [facility], [address], [place], [place] [address]  MRSA Next Gen by PCR, Nasal     Status: None   Collection Time: 08/11/21 12:30 PM   Specimen: Nasal Mucosa; Nasal Swab  Result Value Ref Range Status   MRSA by PCR Next Gen NOT DETECTED NOT DETECTED Final    Comment: (NOTE) The GeneXpert MRSA Assay (FDA approved for NASAL specimens only), is one component of a comprehensive MRSA colonization surveillance program. It is not intended to diagnose MRSA infection nor to guide or monitor treatment for MRSA infections. Test performance is not FDA approved in patients less than 70 years old. Performed at [facility], [address] [place], [place] [address]      Labs: BNP (last 3 results) Recent Labs    08/11/21 0516 08/13/21 0327  BNP 358.7* 325.2*   Basic Metabolic Panel: Recent Labs  Lab 08/10/21 1821 08/10/21 2235 08/11/21 0137 08/11/21 0516  NA 137  --  137 137  K 5.9*  --  5.3* 4.9  CL 93*  --  96* 98  CO2 37*  --  35* 32  GLUCOSE 191*  --  63* 149*  BUN 10  --  13 14  CREATININE 1.20* 1.12* 1.01* 0.99  CALCIUM 9.0  --  8.2* 8.2*  MG  --   --   --  2.3  PHOS  --   --   --  4.8*   Liver Function Tests: Recent  Labs  Lab 08/10/21 1821 08/11/21 0516  AST 72* 149*  ALT 32 33  ALKPHOS 96 66  BILITOT 0.5 1.0  PROT 8.1 7.1  ALBUMIN 3.7 3.2*   No results for input(s): LIPASE, AMYLASE in the last 168 hours. Recent Labs  Lab 08/10/21 1821 08/11/21 0516  AMMONIA 136* 20   CBC: Recent Labs  Lab 08/10/21 1821 08/10/21 2235 08/11/21 1444  WBC 12.3* 14.6* 4.7  NEUTROABS 9.3*  --   --   HGB 14.2 13.1 11.9*  HCT 48.1* 43.8 38.2  MCV 98.8 98.0 93.4  PLT 252 166 120*   Cardiac Enzymes: No results for input(s): CKTOTAL, CKMB, CKMBINDEX, TROPONINI in the last 168 hours. BNP: Invalid input(s): POCBNP CBG: Recent Labs  Lab 08/12/21 0027 08/12/21 0345 08/12/21 0744 08/12/21 1116 08/12/21 2023  GLUCAP 140* 142* 127* 117* 159*   D-Dimer No results for input(s): DDIMER in the last 72 hours. Hgb A1c No results for input(s): HGBA1C in the last 72 hours. Lipid Profile No results for input(s): CHOL, HDL, LDLCALC, TRIG, CHOLHDL, LDLDIRECT in the last 72 hours. Thyroid function studies No results for input(s): TSH, T4TOTAL, T3FREE, THYROIDAB in the last 72 hours.  Invalid input(s): FREET3 Anemia work up No results for input(s): VITAMINB12, FOLATE, FERRITIN, TIBC, IRON, RETICCTPCT in the last 72 hours. Urinalysis    Component Value Date/Time   COLORURINE YELLOW (A) 08/10/2021 1832  APPEARANCEUR CLEAR (A) 08/10/2021 1832   APPEARANCEUR Hazy 08/22/2013 1111   LABSPEC 1.008 08/10/2021 1832   LABSPEC 1.016 08/22/2013 1111   PHURINE 8.0 08/10/2021 1832   GLUCOSEU NEGATIVE 08/10/2021 1832   GLUCOSEU Negative 08/22/2013 1111   HGBUR NEGATIVE 08/10/2021 1832   BILIRUBINUR NEGATIVE 08/10/2021 1832   BILIRUBINUR Negative 08/22/2013 1111   KETONESUR NEGATIVE 08/10/2021 1832   PROTEINUR NEGATIVE 08/10/2021 1832   UROBILINOGEN 0.2 07/24/2014 0635   NITRITE NEGATIVE 08/10/2021 1832   LEUKOCYTESUR NEGATIVE 08/10/2021 1832   LEUKOCYTESUR 1+ 08/22/2013 1111   Sepsis Labs Invalid input(s):  PROCALCITONIN,  WBC,  LACTICIDVEN Microbiology Recent Results (from the past 240 hour(s))  Culture, blood (routine x 2) Call MD if unable to obtain prior to antibiotics being given     Status: None (Preliminary result)   Collection Time: 08/10/21 10:35 PM   Specimen: BLOOD LEFT FOREARM  Result Value Ref Range Status   Specimen Description BLOOD LEFT FOREARM  Final   Special Requests   Final    BOTTLES DRAWN AEROBIC AND ANAEROBIC Blood Culture results may not be optimal due to an inadequate volume of blood received in culture bottles   Culture   Final    NO GROWTH 3 DAYS Performed at [facility], [address], [place], [place] [address]    Report Status PENDING  Incomplete  Culture, blood (routine x 2) Call MD if unable to obtain prior to antibiotics being given     Status: None (Preliminary result)   Collection Time: 08/10/21 10:40 PM   Specimen: BLOOD LEFT FOREARM  Result Value Ref Range Status   Specimen Description BLOOD LEFT FOREARM  Final   Special Requests   Final    BOTTLES DRAWN AEROBIC AND ANAEROBIC Blood Culture results may not be optimal due to an inadequate volume of blood received in culture bottles   Culture   Final    NO GROWTH 3 DAYS Performed at [facility], [address], [place], [place] [address]    Report Status PENDING  Incomplete  Resp Panel by RT-PCR (Flu A&B, Covid) Nasopharyngeal Swab     Status: None   Collection Time: 08/11/21  5:29 AM   Specimen: Nasopharyngeal Swab; Nasopharyngeal(NP) swabs in vial transport medium  Result Value Ref Range Status   SARS Coronavirus 2 by RT PCR NEGATIVE NEGATIVE Final    Comment: (NOTE) SARS-CoV-2 target nucleic acids are NOT DETECTED.  The SARS-CoV-2 RNA is generally detectable in upper respiratory specimens during the acute phase of infection. The lowest concentration of SARS-CoV-2 viral copies this assay can detect is 138 copies/mL. A negative result does not preclude SARS-Cov-2 infection  and should not be used as the sole basis for treatment or other patient management decisions. A negative result may occur with  improper specimen collection/handling, submission of specimen other than nasopharyngeal swab, presence of viral mutation(s) within the areas targeted by this assay, and inadequate number of viral copies(<138 copies/mL). A negative result must be combined with clinical observations, patient history, and epidemiological information. The expected result is Negative.  Fact Sheet for Patients:  BloggerCourse.com  Fact Sheet for Healthcare Providers:  SeriousBroker.it  This test is no t yet approved or cleared by the [place] FDA and  has been authorized for detection and/or diagnosis of SARS-CoV-2 by FDA under an Emergency Use Authorization (EUA). This EUA will remain  in effect (meaning this test can be used) for the duration of the COVID-19 declaration under Section 564(b)(1) of  the Act, 21 U.S.C.section 360bbb-3(b)(1), unless the authorization is terminated  or revoked sooner.       Influenza A by PCR NEGATIVE NEGATIVE Final   Influenza B by PCR NEGATIVE NEGATIVE Final    Comment: (NOTE) The Xpert Xpress SARS-CoV-2/FLU/RSV plus assay is intended as an aid in the diagnosis of influenza from Nasopharyngeal swab specimens and should not be used as a sole basis for treatment. Nasal washings and aspirates are unacceptable for Xpert Xpress SARS-CoV-2/FLU/RSV testing.  Fact Sheet for Patients: BloggerCourse.com  Fact Sheet for Healthcare Providers: SeriousBroker.it  This test is not yet approved or cleared by the [place] FDA and has been authorized for detection and/or diagnosis of SARS-CoV-2 by FDA under an Emergency Use Authorization (EUA). This EUA will remain in effect (meaning this test can be used) for the duration of the COVID-19 declaration  under Section 564(b)(1) of the Act, 21 U.S.C. section 360bbb-3(b)(1), unless the authorization is terminated or revoked.  Performed at [facility], [address] [place], [place] [address]   Respiratory (~20 pathogens) panel by PCR     Status: None   Collection Time: 08/11/21  5:29 AM   Specimen: Nasopharyngeal Swab; Respiratory  Result Value Ref Range Status   Adenovirus NOT DETECTED NOT DETECTED Final   Coronavirus 229E NOT DETECTED NOT DETECTED Final    Comment: (NOTE) The Coronavirus on the Respiratory Panel, DOES NOT test for the novel  Coronavirus (2019 nCoV)    Coronavirus HKU1 NOT DETECTED NOT DETECTED Final   Coronavirus NL63 NOT DETECTED NOT DETECTED Final   Coronavirus OC43 NOT DETECTED NOT DETECTED Final   Metapneumovirus NOT DETECTED NOT DETECTED Final   Rhinovirus / Enterovirus NOT DETECTED NOT DETECTED Final   Influenza A NOT DETECTED NOT DETECTED Final   Influenza B NOT DETECTED NOT DETECTED Final   Parainfluenza Virus 1 NOT DETECTED NOT DETECTED Final   Parainfluenza Virus 2 NOT DETECTED NOT DETECTED Final   Parainfluenza Virus 3 NOT DETECTED NOT DETECTED Final   Parainfluenza Virus 4 NOT DETECTED NOT DETECTED Final   Respiratory Syncytial Virus NOT DETECTED NOT DETECTED Final   Bordetella pertussis NOT DETECTED NOT DETECTED Final   Bordetella Parapertussis NOT DETECTED NOT DETECTED Final   Chlamydophila pneumoniae NOT DETECTED NOT DETECTED Final   Mycoplasma pneumoniae NOT DETECTED NOT DETECTED Final    Comment: Performed at [facility], [address] [place], [place] [address]  MRSA Next Gen by PCR, Nasal     Status: None   Collection Time: 08/11/21 12:30 PM   Specimen: Nasal Mucosa; Nasal Swab  Result Value Ref Range Status   MRSA by PCR Next Gen NOT DETECTED NOT DETECTED Final    Comment: (NOTE) The GeneXpert MRSA Assay (FDA approved for NASAL specimens only), is one component of a comprehensive MRSA colonization  surveillance program. It is not intended to diagnose MRSA infection nor to guide or monitor treatment for MRSA infections. Test performance is not FDA approved in patients less than 75 years old. Performed at [facility], [address] [place], [place] [address]     Time coordinating discharge: Over 30 minutes  SIGNED:  Arnetha Courser, MD  [facility] 08/13/2021, 5:45 PM  If 7PM-7AM, please contact night-coverage [email or web address]  This record has been created using Conservation officer, historic buildings. Errors have been sought and corrected,but may not always be located. Such creation errors do not reflect on the standard of care.

## 2021-08-13 NOTE — Plan of Care (Signed)

## 2021-08-13 NOTE — Care Management Important Message (Signed)
Important Message  Patient Details  Name: Bethany Villa MRN: [id number] Date of Birth: [birth year]   Medicare Important Message Given:  Yes     [name] 08/13/2021, 2:17 PM

## 2021-08-13 NOTE — Progress Notes (Signed)
RN called and said patient will try and wear BIPAP, placed patient on hospital unit. No distress noted at this time.

## 2021-08-13 NOTE — TOC Initial Note (Signed)
Transition of Care [facility]) - Initial/Assessment Note    Patient Details  Name: Bethany Villa MRN: [id number] Date of Birth: 03-24-1956  Transition of Care [facility]) CM/SW Contact:    Maree Krabbe, LCSW Phone Number: 08/13/2021, 2:24 PM  Clinical Narrative:    CSW spoke with pt reguarding dc plan. Pt states she doesn't think she is ready for dc. Pt states she is still short of breath--concerns expressed to MD. CSW has reached out to [facility] to see if they will service pt--awaiting response. Pt has a rollator at home. Pt states her son would pick her up. Pt is concerned about food and states her neighbor does meals on wheels and pt was requesting info on this. CSW has printed info for pt.               Expected Discharge Plan: Home w Home Health Services Barriers to Discharge: No Barriers Identified   Patient Goals and CMS Choice     Choice offered to / list presented to : Patient  Expected Discharge Plan and Services Expected Discharge Plan: Home w Home Health Services In-house Referral: NA   Post Acute Care Choice: Home Health Living arrangements for the past 2 months: Single Family Home Expected Discharge Date: 08/13/21                                    Prior Living Arrangements/Services Living arrangements for the past 2 months: Single Family Home Lives with:: Self Patient language and need for interpreter reviewed:: Yes Do you feel safe going back to the place where you live?: Yes      Need for Family Participation in Patient Care: Yes (Comment) Care giver support system in place?: Yes (comment)   Criminal Activity/Legal Involvement Pertinent to Current Situation/Hospitalization: No - Comment as needed  Activities of Daily Living Home Assistive Devices/Equipment: Dentures (specify type), Oxygen, Wheelchair ADL Screening (condition at time of admission) Patient's cognitive ability adequate to safely complete daily activities?: Yes Is the patient  deaf or have difficulty hearing?: No Does the patient have difficulty seeing, even when wearing glasses/contacts?: No Does the patient have difficulty concentrating, remembering, or making decisions?: No Patient able to express need for assistance with ADLs?: Yes Does the patient have difficulty dressing or bathing?: Yes Independently performs ADLs?: No Communication: Independent Dressing (OT): Needs assistance Is this a change from baseline?: Pre-admission baseline Grooming: Needs assistance Is this a change from baseline?: Pre-admission baseline Feeding: Independent Bathing: Needs assistance Is this a change from baseline?: Pre-admission baseline Toileting: Needs assistance Is this a change from baseline?: Pre-admission baseline In/Out Bed: Needs assistance Is this a change from baseline?: Pre-admission baseline Walks in Home: Independent with device (comment) Does the patient have difficulty walking or climbing stairs?: Yes Weakness of Legs: Both Weakness of Arms/Hands: Both  Permission Sought/Granted Permission sought to share information with : Family Supports Permission granted to share information with : Yes, Release of Information Signed  Share Information with NAME: matthew     Permission granted to share info w Relationship: son     Emotional Assessment Appearance:: Appears stated age Attitude/Demeanor/Rapport: Engaged Affect (typically observed): Anxious Orientation: : Oriented to Self, Oriented to Place, Oriented to  Time, Oriented to Situation Alcohol / Substance Use: Not Applicable Psych Involvement: No (comment)  Admission diagnosis:  Altered mental status [R41.82] Episode of unresponsiveness [R41.89] Increased ammonia level [R79.89] Chest pain [R07.9]  Altered mental status, unspecified altered mental status type [R41.82] Pneumonia due to infectious organism, unspecified laterality, unspecified part of lung [J18.9] Patient Active Problem List   Diagnosis  Date Noted   RUQ pain    Episode of unresponsiveness 08/10/2021   Hyperammonemia (HCC) 08/10/2021   Chronic, continuous use of opioids 08/10/2021   AKI (acute kidney injury) (HCC) 08/10/2021   Hyperkalemia 08/10/2021   Acute hypercapnic respiratory failure (HCC) 10/07/2019   Altered mental status    Chronic systolic CHF (congestive heart failure) (HCC) 09/22/2019   Degeneration of cervical intervertebral disc 07/05/2019   Bilateral primary osteoarthritis of knee 04/26/2019   Chronic bilateral thoracic back pain 12/05/2018   Cervicalgia 12/05/2018   Chronic knee pain after total replacement of left knee joint 12/05/2018   Acute adjustment disorder with anxiety 10/06/2017   Palliative care by specialist    Respiratory failure (HCC) 10/01/2017   COPD exacerbation (HCC) 09/01/2016   Elevated brain natriuretic peptide (BNP) level 09/01/2016   LFT elevation 09/01/2016   Total knee replacement status 04/14/2016   Chronic obstructive pulmonary disease with acute lower respiratory infection (HCC)    Acute respiratory failure with hypoxia and hypercapnia (HCC)    Sepsis (HCC) 09/03/2015   CAP (community acquired pneumonia) 09/01/2015   SIRS (systemic inflammatory response syndrome) (HCC) 07/24/2014   COPD with acute exacerbation (HCC) 09/29/2013   Acute on chronic respiratory failure with hypercapnia (HCC) 11/27/2012   Insomnia 11/04/2012   Generalized anxiety disorder 11/04/2012   Depression 11/04/2012   Fibromyalgia 11/04/2012   Acute metabolic encephalopathy 10/03/2011   COPD (chronic obstructive pulmonary disease) (HCC) 06/01/2011   Smoking 05/19/2011   Obesity 05/19/2011   Hyperglycemia 05/19/2011   PCP:  [name], MD Pharmacy:   [facility] [place], [place] - [address] [place] [address] Phone: [phone] Fax: [phone]  [facility] [address] [place] - [address] [place] [address] Phone:  [phone] Fax: [phone]     Social Determinants of Health (SDOH) Interventions    Readmission Risk Interventions No flowsheet data found.

## 2021-08-15 ENCOUNTER — Inpatient Hospital Stay
Admission: EM | Admit: 2021-08-15 | Discharge: 2021-08-25 | DRG: 190 | Disposition: A | Discharge status: Skilled Nursing Facility | Payer: Medicare Other | Attending: Internal Medicine | Admitting: Internal Medicine

## 2021-08-15 ENCOUNTER — Other Ambulatory Visit: Payer: Self-pay

## 2021-08-15 ENCOUNTER — Emergency Department: Payer: Medicare Other

## 2021-08-15 DIAGNOSIS — Z7951 Long term (current) use of inhaled steroids: Secondary | ICD-10-CM

## 2021-08-15 DIAGNOSIS — Z888 Allergy status to other drugs, medicaments and biological substances status: Secondary | ICD-10-CM

## 2021-08-15 DIAGNOSIS — Z886 Allergy status to analgesic agent status: Secondary | ICD-10-CM

## 2021-08-15 DIAGNOSIS — J811 Chronic pulmonary edema: Secondary | ICD-10-CM

## 2021-08-15 DIAGNOSIS — M797 Fibromyalgia: Secondary | ICD-10-CM | POA: Diagnosis present

## 2021-08-15 DIAGNOSIS — J441 Chronic obstructive pulmonary disease with (acute) exacerbation: Secondary | ICD-10-CM | POA: Diagnosis present

## 2021-08-15 DIAGNOSIS — J9621 Acute and chronic respiratory failure with hypoxia: Secondary | ICD-10-CM | POA: Diagnosis present

## 2021-08-15 DIAGNOSIS — F32A Depression, unspecified: Secondary | ICD-10-CM | POA: Diagnosis present

## 2021-08-15 DIAGNOSIS — Z803 Family history of malignant neoplasm of breast: Secondary | ICD-10-CM

## 2021-08-15 DIAGNOSIS — J9622 Acute and chronic respiratory failure with hypercapnia: Secondary | ICD-10-CM | POA: Diagnosis present

## 2021-08-15 DIAGNOSIS — Z882 Allergy status to sulfonamides status: Secondary | ICD-10-CM

## 2021-08-15 DIAGNOSIS — Z9981 Dependence on supplemental oxygen: Secondary | ICD-10-CM

## 2021-08-15 DIAGNOSIS — R578 Other shock: Secondary | ICD-10-CM | POA: Diagnosis present

## 2021-08-15 DIAGNOSIS — J45901 Unspecified asthma with (acute) exacerbation: Secondary | ICD-10-CM | POA: Diagnosis present

## 2021-08-15 DIAGNOSIS — F17210 Nicotine dependence, cigarettes, uncomplicated: Secondary | ICD-10-CM | POA: Diagnosis present

## 2021-08-15 DIAGNOSIS — Z20822 Contact with and (suspected) exposure to covid-19: Secondary | ICD-10-CM | POA: Diagnosis present

## 2021-08-15 DIAGNOSIS — E785 Hyperlipidemia, unspecified: Secondary | ICD-10-CM | POA: Diagnosis present

## 2021-08-15 DIAGNOSIS — N170 Acute kidney failure with tubular necrosis: Secondary | ICD-10-CM | POA: Diagnosis present

## 2021-08-15 DIAGNOSIS — Z96652 Presence of left artificial knee joint: Secondary | ICD-10-CM | POA: Diagnosis present

## 2021-08-15 DIAGNOSIS — I5033 Acute on chronic diastolic (congestive) heart failure: Secondary | ICD-10-CM | POA: Diagnosis present

## 2021-08-15 DIAGNOSIS — F410 Panic disorder [episodic paroxysmal anxiety] without agoraphobia: Secondary | ICD-10-CM | POA: Diagnosis present

## 2021-08-15 DIAGNOSIS — Z7902 Long term (current) use of antithrombotics/antiplatelets: Secondary | ICD-10-CM

## 2021-08-15 DIAGNOSIS — E669 Obesity, unspecified: Secondary | ICD-10-CM | POA: Diagnosis present

## 2021-08-15 DIAGNOSIS — J9602 Acute respiratory failure with hypercapnia: Secondary | ICD-10-CM

## 2021-08-15 DIAGNOSIS — Z880 Allergy status to penicillin: Secondary | ICD-10-CM

## 2021-08-15 DIAGNOSIS — G8929 Other chronic pain: Secondary | ICD-10-CM | POA: Diagnosis present

## 2021-08-15 DIAGNOSIS — Z6833 Body mass index (BMI) 33.0-33.9, adult: Secondary | ICD-10-CM

## 2021-08-15 DIAGNOSIS — K219 Gastro-esophageal reflux disease without esophagitis: Secondary | ICD-10-CM | POA: Diagnosis present

## 2021-08-15 DIAGNOSIS — Z79899 Other long term (current) drug therapy: Secondary | ICD-10-CM

## 2021-08-15 DIAGNOSIS — K7460 Unspecified cirrhosis of liver: Secondary | ICD-10-CM | POA: Diagnosis present

## 2021-08-15 DIAGNOSIS — R54 Age-related physical debility: Secondary | ICD-10-CM | POA: Diagnosis present

## 2021-08-15 LAB — COMPREHENSIVE METABOLIC PANEL
ALT: 35 U/L (ref 0–44)
AST: 51 U/L — ABNORMAL HIGH (ref 15–41)
Albumin: 3.2 g/dL — ABNORMAL LOW (ref 3.5–5.0)
Alkaline Phosphatase: 57 U/L (ref 38–126)
Anion gap: 5 (ref 5–15)
BUN: 30 mg/dL — ABNORMAL HIGH (ref 8–23)
CO2: 32 mmol/L (ref 22–32)
Calcium: 7.9 mg/dL — ABNORMAL LOW (ref 8.9–10.3)
Chloride: 99 mmol/L (ref 98–111)
Creatinine, Ser: 1.27 mg/dL — ABNORMAL HIGH (ref 0.44–1.00)
GFR, Estimated: 47 mL/min — ABNORMAL LOW (ref >60)
Glucose, Bld: 114 mg/dL — ABNORMAL HIGH (ref 70–99)
Potassium: 4.3 mmol/L (ref 3.5–5.1)
Sodium: 136 mmol/L (ref 135–145)
Total Bilirubin: 0.8 mg/dL (ref 0.3–1.2)
Total Protein: 6.6 g/dL (ref 6.5–8.1)

## 2021-08-15 LAB — CBC WITH DIFFERENTIAL/PLATELET
Abs Immature Granulocytes: 0.2 10*3/uL — ABNORMAL HIGH (ref 0.00–0.07)
Basophils Absolute: 0 10*3/uL (ref 0.0–0.1)
Basophils Relative: 0 %
Eosinophils Absolute: 0.3 10*3/uL (ref 0.0–0.5)
Eosinophils Relative: 1 %
HCT: 42.4 % (ref 36.0–46.0)
Hemoglobin: 12.7 g/dL (ref 12.0–15.0)
Immature Granulocytes: 1 %
Lymphocytes Relative: 20 %
Lymphs Abs: 4.1 10*3/uL — ABNORMAL HIGH (ref 0.7–4.0)
MCH: 29.7 pg (ref 26.0–34.0)
MCHC: 30 g/dL (ref 30.0–36.0)
MCV: 99.1 fL (ref 80.0–100.0)
Monocytes Absolute: 1.6 10*3/uL — ABNORMAL HIGH (ref 0.1–1.0)
Monocytes Relative: 8 %
Neutro Abs: 13.9 10*3/uL — ABNORMAL HIGH (ref 1.7–7.7)
Neutrophils Relative %: 70 %
Platelets: 170 10*3/uL (ref 150–400)
RBC: 4.28 MIL/uL (ref 3.87–5.11)
RDW: 14 % (ref 11.5–15.5)
WBC: 20.2 10*3/uL — ABNORMAL HIGH (ref 4.0–10.5)
nRBC: 0 % (ref 0.0–0.2)

## 2021-08-15 LAB — RESP PANEL BY RT-PCR (FLU A&B, COVID) ARPGX2
Influenza A by PCR: NEGATIVE
Influenza B by PCR: NEGATIVE
SARS Coronavirus 2 by RT PCR: NEGATIVE

## 2021-08-15 LAB — CULTURE, BLOOD (ROUTINE X 2)
Culture: NO GROWTH
Culture: NO GROWTH

## 2021-08-15 LAB — BLOOD GAS, VENOUS
Acid-Base Excess: 5.8 mmol/L — ABNORMAL HIGH (ref 0.0–2.0)
Acid-Base Excess: 2.2 mmol/L — ABNORMAL HIGH (ref 0.0–2.0)
Bicarbonate: 39.7 mmol/L — ABNORMAL HIGH (ref 20.0–28.0)
Bicarbonate: 33.7 mmol/L — ABNORMAL HIGH (ref 20.0–28.0)
FIO2: 0.4
Mode: POSITIVE
O2 Saturation: 52.7 %
O2 Saturation: 43.9 %
Patient temperature: 37
Patient temperature: 37
pCO2, Ven: >120 mmHg (ref 44.0–60.0)
pCO2, Ven: 99 mmHg (ref 44.0–60.0)
pH, Ven: 7.1 — CL (ref 7.250–7.430)
pH, Ven: 7.14 — CL (ref 7.250–7.430)
pO2, Ven: 39 mmHg (ref 32.0–45.0)
pO2, Ven: 33 mmHg (ref 32.0–45.0)

## 2021-08-15 LAB — CBG MONITORING, ED: Glucose-Capillary: 142 mg/dL — ABNORMAL HIGH (ref 70–99)

## 2021-08-15 LAB — LACTIC ACID, PLASMA
Lactic Acid, Venous: 0.8 mmol/L (ref 0.5–1.9)
Lactic Acid, Venous: 1.2 mmol/L (ref 0.5–1.9)

## 2021-08-15 LAB — PROTIME-INR
INR: 1 (ref 0.8–1.2)
Prothrombin Time: 13.4 seconds (ref 11.4–15.2)

## 2021-08-15 LAB — BRAIN NATRIURETIC PEPTIDE: B Natriuretic Peptide: 230.5 pg/mL — ABNORMAL HIGH (ref 0.0–100.0)

## 2021-08-15 LAB — TROPONIN I (HIGH SENSITIVITY)
Troponin I (High Sensitivity): 48 ng/L — ABNORMAL HIGH (ref <18)
Troponin I (High Sensitivity): 35 ng/L — ABNORMAL HIGH (ref <18)

## 2021-08-15 LAB — APTT: aPTT: 25 seconds (ref 24–36)

## 2021-08-15 LAB — MAGNESIUM: Magnesium: 2.1 mg/dL (ref 1.7–2.4)

## 2021-08-15 LAB — MRSA NEXT GEN BY PCR, NASAL: MRSA by PCR Next Gen: NOT DETECTED

## 2021-08-15 LAB — AMMONIA: Ammonia: 33 umol/L (ref 9–35)

## 2021-08-15 MED ORDER — IPRATROPIUM-ALBUTEROL 0.5-2.5 (3) MG/3ML IN SOLN
3.0000 mL | RESPIRATORY_TRACT | Status: DC
  Administered 2021-08-15: 3 mL via RESPIRATORY_TRACT
  Filled 2021-08-15: qty 3

## 2021-08-15 MED ORDER — NALOXONE HCL 0.4 MG/ML IJ SOLN
0.2000 mg | Freq: Once | INTRAMUSCULAR | Status: AC
  Administered 2021-08-15: 0.2 mg via INTRAVENOUS

## 2021-08-15 MED ORDER — POLYETHYLENE GLYCOL 3350 17 G PO PACK
17.0000 g | PACK | Freq: Every day | ORAL | Status: DC | PRN
  Filled 2021-08-15: qty 1

## 2021-08-15 MED ORDER — NALOXONE HCL 2 MG/2ML IJ SOSY
PREFILLED_SYRINGE | INTRAMUSCULAR | Status: AC
  Filled 2021-08-15: qty 2

## 2021-08-15 MED ORDER — IPRATROPIUM-ALBUTEROL 0.5-2.5 (3) MG/3ML IN SOLN
3.0000 mL | Freq: Once | RESPIRATORY_TRACT | Status: AC
  Administered 2021-08-15: 3 mL via RESPIRATORY_TRACT
  Filled 2021-08-15: qty 3

## 2021-08-15 MED ORDER — IPRATROPIUM-ALBUTEROL 0.5-2.5 (3) MG/3ML IN SOLN: 3.0000 mL | Freq: Four times a day (QID) | RESPIRATORY_TRACT | Status: DC | PRN

## 2021-08-15 MED ORDER — METHYLPREDNISOLONE SODIUM SUCC 125 MG IJ SOLR
125.0000 mg | Freq: Once | INTRAMUSCULAR | Status: AC
  Administered 2021-08-15: 125 mg via INTRAVENOUS
  Filled 2021-08-15: qty 2

## 2021-08-15 MED ORDER — SODIUM CHLORIDE 0.9 % IV SOLN
500.0000 mg | Freq: Once | INTRAVENOUS | Status: AC
  Administered 2021-08-15: 500 mg via INTRAVENOUS
  Filled 2021-08-15: qty 5

## 2021-08-15 MED ORDER — VANCOMYCIN HCL IN DEXTROSE 1-5 GM/200ML-% IV SOLN: 1000.0000 mg | Freq: Once | INTRAVENOUS | Status: DC

## 2021-08-15 MED ORDER — DOCUSATE SODIUM 100 MG PO CAPS
100.0000 mg | ORAL_CAPSULE | Freq: Two times a day (BID) | ORAL | Status: DC | PRN
  Administered 2021-08-21: 12:00:00 100 mg via ORAL
  Filled 2021-08-15: qty 1

## 2021-08-15 MED ORDER — SODIUM CHLORIDE 0.9 % IV BOLUS (SEPSIS)
1000.0000 mL | Freq: Once | INTRAVENOUS | Status: AC
  Administered 2021-08-15: 1000 mL via INTRAVENOUS

## 2021-08-15 MED ORDER — NOREPINEPHRINE 4 MG/250ML-% IV SOLN
2.0000 ug/min | INTRAVENOUS | Status: DC
Start: 2021-08-15 — End: 2021-08-16
  Administered 2021-08-15: 7.5 ug/min via INTRAVENOUS
  Administered 2021-08-16: 03:00:00 7 ug/min via INTRAVENOUS
  Filled 2021-08-15: qty 250

## 2021-08-15 MED ORDER — SODIUM CHLORIDE 0.9 % IV SOLN
2.0000 g | Freq: Once | INTRAVENOUS | Status: AC
  Administered 2021-08-15: 2 g via INTRAVENOUS
  Filled 2021-08-15: qty 2

## 2021-08-15 MED ORDER — SODIUM CHLORIDE 0.9 % IV BOLUS
500.0000 mL | Freq: Once | INTRAVENOUS | Status: AC
  Administered 2021-08-15: 500 mL via INTRAVENOUS

## 2021-08-15 MED ORDER — METHYLPREDNISOLONE SODIUM SUCC 40 MG IJ SOLR
40.0000 mg | Freq: Two times a day (BID) | INTRAMUSCULAR | Status: DC
  Administered 2021-08-16: 10:00:00 40 mg via INTRAVENOUS
  Filled 2021-08-15: qty 1

## 2021-08-15 MED ORDER — INSULIN ASPART 100 UNIT/ML IJ SOLN
0.0000 [IU] | INTRAMUSCULAR | Status: DC
  Administered 2021-08-15 – 2021-08-16 (×3): 3 [IU] via SUBCUTANEOUS
  Administered 2021-08-16: 12:00:00 7 [IU] via SUBCUTANEOUS
  Administered 2021-08-16 – 2021-08-17 (×4): 3 [IU] via SUBCUTANEOUS
  Administered 2021-08-17: 16:00:00 4 [IU] via SUBCUTANEOUS
  Administered 2021-08-17: 12:00:00 2 [IU] via SUBCUTANEOUS
  Filled 2021-08-15 (×10): qty 1

## 2021-08-15 MED ORDER — IPRATROPIUM-ALBUTEROL 0.5-2.5 (3) MG/3ML IN SOLN
3.0000 mL | Freq: Four times a day (QID) | RESPIRATORY_TRACT | Status: DC
  Administered 2021-08-16 – 2021-08-17 (×6): 3 mL via RESPIRATORY_TRACT
  Filled 2021-08-15 (×6): qty 3

## 2021-08-15 MED ORDER — NOREPINEPHRINE 4 MG/250ML-% IV SOLN
0.0000 ug/min | INTRAVENOUS | Status: DC
  Administered 2021-08-15: 2 ug/min via INTRAVENOUS
  Filled 2021-08-15: qty 250

## 2021-08-15 MED ORDER — SODIUM CHLORIDE 0.9 % IV SOLN: 250.0000 mL | INTRAVENOUS | Status: DC

## 2021-08-15 MED ORDER — BUDESONIDE 0.25 MG/2ML IN SUSP
0.2500 mg | Freq: Two times a day (BID) | RESPIRATORY_TRACT | Status: DC
  Administered 2021-08-15 – 2021-08-25 (×20): 0.25 mg via RESPIRATORY_TRACT
  Filled 2021-08-15 (×20): qty 2

## 2021-08-15 NOTE — ED Notes (Signed)
Lab called regarding blood cultures. Per previous RN, two sets of cultures had been drawn and sent to lab. Lab unable to find culture sets - agreed to send phlebotomist to ED to redraw blood cultures.

## 2021-08-15 NOTE — ED Notes (Signed)
[place], NP called to clarify - peripheral norepinephrine drip titration should not exceed 10 mcg. If titration requires exceeding 10 mcg, provider should be notified and central line should be initiated.

## 2021-08-15 NOTE — Progress Notes (Signed)
Previous mask would on bridge of nose. Circle cushion and Gecko placed on bridge of nose for comfort.

## 2021-08-15 NOTE — ED Provider Notes (Signed)
[facility] Emergency Department Provider Note    Event Date/Time   First MD Initiated Contact with Patient 08/15/21 1539     (approximate)  I have reviewed the triage vital signs and the nursing notes.   HISTORY  Chief Complaint Altered Mental Status  Level V Caveat:  resp distress  HPI Bethany Villa is a 65 y.o. female history of COPD as well as CHF and recent diagnosis admission for pneumonia presents from facility due to worsening shortness of breath reported was found to be hypoxic tachypneic with EMS and during transport became increasingly hypotensive requiring additional IV fluids as well as initiation of epinephrine.  He was given 5 of albuterol for wheezing.  Patient states that she is hurting all over.  Past Medical History:  Diagnosis Date   Anxiety    Asthma    Chronic pain    lower back   Chronic systolic CHF (congestive heart failure) (HCC) 09/22/2019   COPD (chronic obstructive pulmonary disease) (HCC)    Depression    DJD (degenerative joint disease)    Fibromyalgia    GERD (gastroesophageal reflux disease)    Headache    Hyperlipidemia    Lower extremity edema    On home oxygen therapy    has not been on since 2016   Panic attack    Shortness of breath dyspnea    Sleep disorder    Family History  Problem Relation Age of Onset   Cancer Mother        beast   Past Surgical History:  Procedure Laterality Date   ABDOMINAL SURGERY     ANKLE SURGERY     BACK SURGERY     lower   CARPAL TUNNEL RELEASE     CESAREAN SECTION     I & D KNEE WITH POLY EXCHANGE N/A 06/11/2020   Procedure: IRRIGATION AND DEBRIDEMENT KNEE WITH POLY EXCHANGE;  Surgeon: Christena Flake, MD;  Location: [facility] ORS;  Service: Orthopedics;  Laterality: N/A;   KNEE SURGERY     TONSILLECTOMY     TOTAL KNEE ARTHROPLASTY Left 04/14/2016   Procedure: TOTAL KNEE ARTHROPLASTY;  Surgeon: Deeann Saint, MD;  Location: [facility] ORS;  Service: Orthopedics;  Laterality:  Left;   TUBAL LIGATION     Patient Active Problem List   Diagnosis Date Noted   Acute exacerbation of COPD with asthma (HCC) 08/15/2021   RUQ pain    Episode of unresponsiveness 08/10/2021   Hyperammonemia (HCC) 08/10/2021   Chronic, continuous use of opioids 08/10/2021   AKI (acute kidney injury) (HCC) 08/10/2021   Hyperkalemia 08/10/2021   Acute hypercapnic respiratory failure (HCC) 10/07/2019   Altered mental status    Chronic systolic CHF (congestive heart failure) (HCC) 09/22/2019   Degeneration of cervical intervertebral disc 07/05/2019   Bilateral primary osteoarthritis of knee 04/26/2019   Chronic bilateral thoracic back pain 12/05/2018   Cervicalgia 12/05/2018   Chronic knee pain after total replacement of left knee joint 12/05/2018   Acute adjustment disorder with anxiety 10/06/2017   Palliative care by specialist    Respiratory failure (HCC) 10/01/2017   COPD exacerbation (HCC) 09/01/2016   Elevated brain natriuretic peptide (BNP) level 09/01/2016   LFT elevation 09/01/2016   Total knee replacement status 04/14/2016   Chronic obstructive pulmonary disease with acute lower respiratory infection (HCC)    Acute respiratory failure with hypoxia and hypercapnia (HCC)    Sepsis (HCC) 09/03/2015   CAP (community acquired pneumonia) 09/01/2015   SIRS (  systemic inflammatory response syndrome) (HCC) 07/24/2014   COPD with acute exacerbation (HCC) 09/29/2013   Acute on chronic respiratory failure with hypercapnia (HCC) 11/27/2012   Insomnia 11/04/2012   Generalized anxiety disorder 11/04/2012   Depression 11/04/2012   Fibromyalgia 11/04/2012   Acute metabolic encephalopathy 10/03/2011   COPD (chronic obstructive pulmonary disease) (HCC) 06/01/2011   Smoking 05/19/2011   Obesity 05/19/2011   Hyperglycemia 05/19/2011      Prior to Admission medications   Medication Sig Start Date End Date Taking? Authorizing Provider  clopidogrel (PLAVIX) 75 MG tablet Take 1 tablet (75  mg total) by mouth daily. 08/13/21 08/13/22 Yes Arnetha Courser, MD  guaiFENesin-dextromethorphan (ROBITUSSIN DM) 100-10 MG/5ML syrup Take 5 mLs by mouth every 4 (four) hours as needed for cough. 08/13/21  Yes Arnetha Courser, MD  lactulose (CHRONULAC) 10 GM/15ML solution Take 30 mLs (20 g total) by mouth 2 (two) times daily. 08/13/21  Yes Arnetha Courser, MD  levofloxacin (LEVAQUIN) 750 MG tablet Take 1 tablet (750 mg total) by mouth daily for 4 days. 08/13/21 08/17/21 Yes Arnetha Courser, MD  losartan (COZAAR) 50 MG tablet Take 1 tablet (50 mg total) by mouth daily. 08/14/21  Yes Arnetha Courser, MD  pantoprazole (PROTONIX) 40 MG tablet Take 1 tablet (40 mg total) by mouth daily. 08/14/21  Yes Arnetha Courser, MD  buprenorphine (BUTRANS) 7.5 MCG/HR Place 1 patch onto the skin once a week. For chronic pain syndrome Patient not taking: Reported on 08/15/2021 06/02/21 08/25/21  Edward Jolly, MD  lidocaine (LIDODERM) 5 % Place 1 patch onto the skin daily. Remove & Discard patch within 12 hours or as directed by MD Patient not taking: Reported on 08/15/2021 08/13/21   Arnetha Courser, MD    Allergies Gabapentin, Mobic [meloxicam], Penicillins, and Sulfa antibiotics    Social History Social History   Tobacco Use   Smoking status: Every Day    Packs/day: 1.00    Years: 45.00    Pack years: 45.00    Types: Cigarettes   Smokeless tobacco: Never  Vaping Use   Vaping Use: Never used  Substance Use Topics   Alcohol use: Yes    Alcohol/week: 0.0 standard drinks    Comment: rare   Drug use: No    Review of Systems Patient denies headaches, rhinorrhea, blurry vision, numbness, shortness of breath, chest pain, edema, cough, abdominal pain, nausea, vomiting, diarrhea, dysuria, fevers, rashes or hallucinations unless otherwise stated above in HPI. ____________________________________________   PHYSICAL EXAM:  VITAL SIGNS: Vitals:   08/15/21 1900 08/15/21 2000  BP: (!) 97/55 (!) 98/58  Pulse: 76 75   Resp:  17  Temp:    SpO2: 100% 98%    Constitutional: somewhat drowsy but responds to questions and follows commands.  Resp distress Eyes: Conjunctivae are normal. Pupils 3mm Head: Atraumatic. Nose: No congestion/rhinnorhea. Mouth/Throat: Mucous membranes are moist.   Neck: No stridor. Painless ROM.  Cardiovascular: Normal rate, regular rhythm. Grossly normal heart sounds.  Good peripheral circulation. Respiratory: tachypjnea with diminished bs throughout Gastrointestinal: Soft and nontender. No distention. No abdominal bruits. No CVA tenderness. Genitourinary:  Musculoskeletal: No lower extremity tenderness nor edema.  No joint effusions. Neurologic:   No gross focal neurologic deficits are appreciated. No facial droop Skin:  Skin is warm, dry and intact. No rash noted. Psychiatric: Mood and affect are normal. Speech and behavior are normal.  ____________________________________________   LABS (all labs ordered are listed, but only abnormal results are displayed)  Results for orders placed or performed during  the hospital encounter of 08/15/21 (from the past 24 hour(s))  Resp Panel by RT-PCR (Flu A&B, Covid) Nasopharyngeal Swab     Status: None   Collection Time: 08/15/21  3:42 PM   Specimen: Nasopharyngeal Swab; Nasopharyngeal(NP) swabs in vial transport medium  Result Value Ref Range   SARS Coronavirus 2 by RT PCR NEGATIVE NEGATIVE   Influenza A by PCR NEGATIVE NEGATIVE   Influenza B by PCR NEGATIVE NEGATIVE  Lactic acid, plasma     Status: None   Collection Time: 08/15/21  3:42 PM  Result Value Ref Range   Lactic Acid, Venous 0.8 0.5 - 1.9 mmol/L  Comprehensive metabolic panel     Status: Abnormal   Collection Time: 08/15/21  3:42 PM  Result Value Ref Range   Sodium 136 135 - 145 mmol/L   Potassium 4.3 3.5 - 5.1 mmol/L   Chloride 99 98 - 111 mmol/L   CO2 32 22 - 32 mmol/L   Glucose, Bld 114 (H) 70 - 99 mg/dL   BUN 30 (H) 8 - 23 mg/dL   Creatinine, Ser 1.61 (H) 0.44  - 1.00 mg/dL   Calcium 7.9 (L) 8.9 - 10.3 mg/dL   Total Protein 6.6 6.5 - 8.1 g/dL   Albumin 3.2 (L) 3.5 - 5.0 g/dL   AST 51 (H) 15 - 41 U/L   ALT 35 0 - 44 U/L   Alkaline Phosphatase 57 38 - 126 U/L   Total Bilirubin 0.8 0.3 - 1.2 mg/dL   GFR, Estimated 47 (L) >60 mL/min   Anion gap 5 5 - 15  CBC WITH DIFFERENTIAL     Status: Abnormal   Collection Time: 08/15/21  3:42 PM  Result Value Ref Range   WBC 20.2 (H) 4.0 - 10.5 K/uL   RBC 4.28 3.87 - 5.11 MIL/uL   Hemoglobin 12.7 12.0 - 15.0 g/dL   HCT 09.6 04.5 - 40.9 %   MCV 99.1 80.0 - 100.0 fL   MCH 29.7 26.0 - 34.0 pg   MCHC 30.0 30.0 - 36.0 g/dL   RDW 81.1 91.4 - 78.2 %   Platelets 170 150 - 400 K/uL   nRBC 0.0 0.0 - 0.2 %   Neutrophils Relative % 70 %   Neutro Abs 13.9 (H) 1.7 - 7.7 K/uL   Lymphocytes Relative 20 %   Lymphs Abs 4.1 (H) 0.7 - 4.0 K/uL   Monocytes Relative 8 %   Monocytes Absolute 1.6 (H) 0.1 - 1.0 K/uL   Eosinophils Relative 1 %   Eosinophils Absolute 0.3 0.0 - 0.5 K/uL   Basophils Relative 0 %   Basophils Absolute 0.0 0.0 - 0.1 K/uL   Immature Granulocytes 1 %   Abs Immature Granulocytes 0.20 (H) 0.00 - 0.07 K/uL  Protime-INR     Status: None   Collection Time: 08/15/21  3:42 PM  Result Value Ref Range   Prothrombin Time 13.4 11.4 - 15.2 seconds   INR 1.0 0.8 - 1.2  APTT     Status: None   Collection Time: 08/15/21  3:42 PM  Result Value Ref Range   aPTT 25 24 - 36 seconds  Troponin I (High Sensitivity)     Status: Abnormal   Collection Time: 08/15/21  3:42 PM  Result Value Ref Range   Troponin I (High Sensitivity) 48 (H) <18 ng/L  Brain natriuretic peptide     Status: Abnormal   Collection Time: 08/15/21  3:47 PM  Result Value Ref Range   B Natriuretic Peptide  230.5 (H) 0.0 - 100.0 pg/mL  Blood gas, venous     Status: Abnormal   Collection Time: 08/15/21  4:23 PM  Result Value Ref Range   pH, Ven 7.10 (LL) 7.250 - 7.430   pCO2, Ven >120.0 (HH) 44.0 - 60.0 mmHg   pO2, Ven 39.0 32.0 - 45.0 mmHg    Bicarbonate 39.7 (H) 20.0 - 28.0 mmol/L   Acid-Base Excess 5.8 (H) 0.0 - 2.0 mmol/L   O2 Saturation 52.7 %   Patient temperature 37.0    Collection site VENOUS    Sample type VENOUS   Ammonia     Status: None   Collection Time: 08/15/21  5:10 PM  Result Value Ref Range   Ammonia 33 9 - 35 umol/L  Blood gas, venous     Status: Abnormal   Collection Time: 08/15/21  5:10 PM  Result Value Ref Range   FIO2 0.40    Mode BILEVEL POSITIVE AIRWAY PRESSURE    pH, Ven 7.14 (LL) 7.250 - 7.430   pCO2, Ven 99 (HH) 44.0 - 60.0 mmHg   pO2, Ven 33.0 32.0 - 45.0 mmHg   Bicarbonate 33.7 (H) 20.0 - 28.0 mmol/L   Acid-Base Excess 2.2 (H) 0.0 - 2.0 mmol/L   O2 Saturation 43.9 %   Patient temperature 37.0    Collection site VENOUS    Sample type VENOUS   Lactic acid, plasma     Status: None   Collection Time: 08/15/21  6:17 PM  Result Value Ref Range   Lactic Acid, Venous 1.2 0.5 - 1.9 mmol/L  Troponin I (High Sensitivity)     Status: Abnormal   Collection Time: 08/15/21  6:17 PM  Result Value Ref Range   Troponin I (High Sensitivity) 35 (H) <18 ng/L  Magnesium     Status: None   Collection Time: 08/15/21  7:47 PM  Result Value Ref Range   Magnesium 2.1 1.7 - 2.4 mg/dL  CBG monitoring, ED     Status: Abnormal   Collection Time: 08/15/21  7:56 PM  Result Value Ref Range   Glucose-Capillary 142 (H) 70 - 99 mg/dL   ____________________________________________  EKG My review and personal interpretation at Time: 15:39   Indication: sob  Rate: 100  Rhythm: sinus Axis: normal Other: nonspecific st abn, abn ekg ____________________________________________  RADIOLOGY  I personally reviewed all radiographic images ordered to evaluate for the above acute complaints and reviewed radiology reports and findings.  These findings were personally discussed with the patient.  Please see medical record for radiology report.  ____________________________________________   PROCEDURES  Procedure(s)  performed:  .Critical Care Performed by: Willy Eddy, MD Authorized by: Willy Eddy, MD   Critical care provider statement:    Critical care time (minutes):  45   Critical care was necessary to treat or prevent imminent or life-threatening deterioration of the following conditions:  Respiratory failure and circulatory failure   Critical care was time spent personally by me on the following activities:  Ordering and performing treatments and interventions, ordering and review of laboratory studies, ordering and review of radiographic studies, pulse oximetry, re-evaluation of patient's condition, review of old charts, obtaining history from patient or surrogate, examination of patient, evaluation of patient's response to treatment, discussions with primary provider, discussions with consultants, development of treatment plan with patient or surrogate and ventilator management    Critical Care performed: yes ____________________________________________   INITIAL IMPRESSION / ASSESSMENT AND PLAN / ED COURSE  Pertinent labs & imaging results that  were available during my care of the patient were reviewed by me and considered in my medical decision making (see chart for details).   DDX: Asthma, copd, CHF, pna, ptx, malignancy, Pe, anemia   Bethany Villa is a 65 y.o. who presents to the ED with presentation as described above.  Patient drowsy but appears to be protecting her airway is hypotensive and hypoxic concerning for mixed picture presentation complicated by recent hospitalization.  Patient to be placed on BiPAP will order VBG water nebs.  Will give IV fluids.  Clinical Course as of 08/15/21 2149  Sat Aug 15, 2021  1639 Notified by respiratory the VBG with critical hypercapnia however the patient seems to be mentating much better on BiPAP now she is saying that he is uncomfortable would like to adjust the mask.  We will repeat.  We will continue to observe.  Her blood pressure  seems to be improving with IV hydration.  No lactic acidemia.  Significant white count may be secondary to recent hospitalization with steroid administration for her COPD.  Does not appear to have CHF with recent echocardiogram with preserved EF.  We will continue with IV hydration and reassessment. [PR]  1737 Patient reassessed again after repeat VBG showed significant improvement.  She is mentating much better.  Blood pressure still soft but maps improving continuing IV for hydration.  Review of her records show that there is suspicion for polysubstance abuse that her pupils are now are not constricted she does not have bradypnea.  May be secondary to underlying COPD. [PR]  1756 Patient continues to improve she is talking.  BP hypotensive despite IVF.  Norepi initiated.  Suspect she is quite tolerant of hypercapnia.  Dr. Karna Christmas of ICU was at bedside as evaluated patient.  We will plan for admission to the ICU. [PR]    Clinical Course User Index [PR] Willy Eddy, MD    The patient was evaluated in Emergency Department today for the symptoms described in the history of present illness. He/she was evaluated in the context of the global COVID-19 pandemic, which necessitated consideration that the patient might be at risk for infection with the SARS-CoV-2 virus that causes COVID-19. Institutional protocols and algorithms that pertain to the evaluation of patients at risk for COVID-19 are in a state of rapid change based on information released by regulatory bodies including the CDC and federal and state organizations. These policies and algorithms were followed during the patient's care in the ED.  As part of my medical decision making, I reviewed the following data within the electronic MEDICAL RECORD NUMBER Nursing notes reviewed and incorporated, Labs reviewed, notes from prior ED visits and [place] Controlled Substance Database   ____________________________________________   FINAL CLINICAL  IMPRESSION(S) / ED DIAGNOSES  Final diagnoses:  Acute respiratory failure with hypercapnia (HCC)  COPD exacerbation (HCC)      NEW MEDICATIONS STARTED DURING THIS VISIT:  Current Discharge Medication List       Note:  This document was prepared using Dragon voice recognition software and may include unintentional dictation errors.    Willy Eddy, MD 08/15/21 2149

## 2021-08-15 NOTE — ED Notes (Signed)
Attempted to call ICU to give report. Report deferred.

## 2021-08-15 NOTE — H&P (Signed)
CRITICAL CARE PROGRESS NOTE    Name: MACYN RUTZ MRN: [id number] DOB: 1956/01/21     LOS: 0   SUBJECTIVE FINDINGS & SIGNIFICANT EVENTS    Patient description:  65 yo patient with hx of bedbugs, COPD, lifelong smoking, heart failure came in from facility due to worsening doe/sob cough with s/s of AECOPD and flu like illness.   Lines/tubes :   Microbiology/Sepsis markers: Results for orders placed or performed during the hospital encounter of 08/15/21  Resp Panel by RT-PCR (Flu A&B, Covid) Nasopharyngeal Swab     Status: None   Collection Time: 08/15/21  3:42 PM   Specimen: Nasopharyngeal Swab; Nasopharyngeal(NP) swabs in vial transport medium  Result Value Ref Range Status   SARS Coronavirus 2 by RT PCR NEGATIVE NEGATIVE Final    Comment: (NOTE) SARS-CoV-2 target nucleic acids are NOT DETECTED.  The SARS-CoV-2 RNA is generally detectable in upper respiratory specimens during the acute phase of infection. The lowest concentration of SARS-CoV-2 viral copies this assay can detect is 138 copies/mL. A negative result does not preclude SARS-Cov-2 infection and should not be used as the sole basis for treatment or other patient management decisions. A negative result may occur with  improper specimen collection/handling, submission of specimen other than nasopharyngeal swab, presence of viral mutation(s) within the areas targeted by this assay, and inadequate number of viral copies(<138 copies/mL). A negative result must be combined with clinical observations, patient history, and epidemiological information. The expected result is Negative.  Fact Sheet for Patients:  EntrepreneurPulse.com.au  Fact Sheet for Healthcare Providers:  IncredibleEmployment.be  This test is  no t yet approved or cleared by the [place] FDA and  has been authorized for detection and/or diagnosis of SARS-CoV-2 by FDA under an Emergency Use Authorization (EUA). This EUA will remain  in effect (meaning this test can be used) for the duration of the COVID-19 declaration under Section 564(b)(1) of the Act, 21 U.S.C.section 360bbb-3(b)(1), unless the authorization is terminated  or revoked sooner.       Influenza A by PCR NEGATIVE NEGATIVE Final   Influenza B by PCR NEGATIVE NEGATIVE Final    Comment: (NOTE) The Xpert Xpress SARS-CoV-2/FLU/RSV plus assay is intended as an aid in the diagnosis of influenza from Nasopharyngeal swab specimens and should not be used as a sole basis for treatment. Nasal washings and aspirates are unacceptable for Xpert Xpress SARS-CoV-2/FLU/RSV testing.  Fact Sheet for Patients: EntrepreneurPulse.com.au  Fact Sheet for Healthcare Providers: IncredibleEmployment.be  This test is not yet approved or cleared by the [place] FDA and has been authorized for detection and/or diagnosis of SARS-CoV-2 by FDA under an Emergency Use Authorization (EUA). This EUA will remain in effect (meaning this test can be used) for the duration of the COVID-19 declaration under Section 564(b)(1) of the Act, 21 U.S.C. section 360bbb-3(b)(1), unless the authorization is terminated or revoked.  Performed at [facility], [address], [place],  [address]     Anti-infectives:  Anti-infectives (From admission, onward)    Start     Dose/Rate Route Frequency Ordered Stop   08/15/21 1545  vancomycin (VANCOCIN) IVPB 1000 mg/200 mL premix  Status:  Discontinued        1,000 mg 200 mL/hr over 60 Minutes Intravenous  Once 08/15/21 1542 08/15/21 1548   08/15/21 1545  ceFEPIme (MAXIPIME) 2 g in sodium chloride 0.9 % 100 mL IVPB        2 g 200 mL/hr over 30 Minutes Intravenous  Once 08/15/21 1542 08/15/21 1648    08/15/21 1545  azithromycin (ZITHROMAX) 500 mg in sodium chloride 0.9 % 250 mL IVPB        500 mg 250 mL/hr over 60 Minutes Intravenous  Once 08/15/21 1542 08/15/21 1732        PAST MEDICAL HISTORY   Past Medical History:  Diagnosis Date   Anxiety    Asthma    Chronic pain    lower back   Chronic systolic CHF (congestive heart failure) ([place]) 09/22/2019   COPD (chronic obstructive pulmonary disease) (HCC)    Depression    DJD (degenerative joint disease)    Fibromyalgia    GERD (gastroesophageal reflux disease)    Headache    Hyperlipidemia    Lower extremity edema    On home oxygen therapy    has not been on since 2016   Panic attack    Shortness of breath dyspnea    Sleep disorder      SURGICAL HISTORY   Past Surgical History:  Procedure Laterality Date   ABDOMINAL SURGERY     ANKLE SURGERY     BACK SURGERY     lower   CARPAL TUNNEL RELEASE     CESAREAN SECTION     I & D KNEE WITH POLY EXCHANGE N/A 06/11/2020   Procedure: IRRIGATION AND DEBRIDEMENT KNEE WITH POLY EXCHANGE;  Surgeon: Corky Mull, MD;  Location: [facility] ORS;  Service: Orthopedics;  Laterality: N/A;   KNEE SURGERY     TONSILLECTOMY     TOTAL KNEE ARTHROPLASTY Left 04/14/2016   Procedure: TOTAL KNEE ARTHROPLASTY;  Surgeon: Earnestine Leys, MD;  Location: [facility] ORS;  Service: Orthopedics;  Laterality: Left;   TUBAL LIGATION       FAMILY HISTORY   Family History  Problem Relation Age of Onset   Cancer Mother        beast     SOCIAL HISTORY   Social History   Tobacco Use   Smoking status: Every Day    Packs/day: 1.00    Years: 45.00    Pack years: 45.00    Types: Cigarettes   Smokeless tobacco: Never  Vaping Use   Vaping Use: Never used  Substance Use Topics   Alcohol use: Yes    Alcohol/week: 0.0 standard drinks    Comment: rare   Drug use: No     MEDICATIONS   Current Medication:  Current Facility-Administered Medications:    docusate sodium (COLACE) capsule 100 mg, 100  mg, Oral, BID PRN, Ottie Glazier, MD   methylPREDNISolone sodium succinate (SOLU-MEDROL) 125 mg/2 mL injection 125 mg, 125 mg, Intravenous, Once, Merlyn Lot, MD   naloxone [facility]) injection 0.2 mg, 0.2 mg, Intravenous, Once, Merlyn Lot, MD   norepinephrine (LEVOPHED) 4mg  in 230mL (0.016 mg/mL) premix infusion, 0-40 mcg/min, Intravenous, Continuous, Merlyn Lot, MD, Last Rate: 22.5 mL/hr at 08/15/21 1732, 6 mcg/min at 08/15/21 1732   polyethylene glycol (MIRALAX / GLYCOLAX) packet 17 g, 17 g, Oral, Daily PRN, Ottie Glazier, MD  Current Outpatient Medications:    buprenorphine (BUTRANS) 7.5 MCG/HR, Place 1 patch onto the skin once a week. For chronic pain syndrome, Disp: 4 patch, Rfl: 2   busPIRone (BUSPAR) 5 MG tablet, Take 5 mg by mouth 3 (three) times daily.  (Patient not taking: Reported on 06/02/2021), Disp: , Rfl:    clopidogrel (PLAVIX) 75 MG tablet, Take 1 tablet (75 mg total) by mouth daily., Disp: 30 tablet, Rfl: 11   guaiFENesin-dextromethorphan (ROBITUSSIN DM)  100-10 MG/5ML syrup, Take 5 mLs by mouth every 4 (four) hours as needed for cough., Disp: 118 mL, Rfl: 0   lactulose (CHRONULAC) 10 GM/15ML solution, Take 30 mLs (20 g total) by mouth 2 (two) times daily., Disp: 236 mL, Rfl: 0   levofloxacin (LEVAQUIN) 750 MG tablet, Take 1 tablet (750 mg total) by mouth daily for 4 days., Disp: 4 tablet, Rfl: 0   lidocaine (LIDODERM) 5 %, Place 1 patch onto the skin daily. Remove & Discard patch within 12 hours or as directed by MD, Disp: 30 patch, Rfl: 0   losartan (COZAAR) 50 MG tablet, Take 1 tablet (50 mg total) by mouth daily., Disp: 30 tablet, Rfl: 1   pantoprazole (PROTONIX) 40 MG tablet, Take 1 tablet (40 mg total) by mouth daily., Disp: 30 tablet, Rfl: 0    ALLERGIES   Gabapentin, Mobic [meloxicam], Penicillins, and Sulfa antibiotics    REVIEW OF SYSTEMS     10 point ros done and is negative except as per sub findings  PHYSICAL EXAMINATION   Vital  Signs: Temp:  [97.5 F (36.4 C)] 97.5 F (36.4 C) (12/17 1620) Pulse Rate:  [79-100] 79 (12/17 1800) Resp:  [16-20] 16 (12/17 1715) BP: (72-104)/(39-58) 90/54 (12/17 1800) SpO2:  [88 %-100 %] 99 % (12/17 1800) FiO2 (%):  [40 %] 40 % (12/17 1615) Weight:  [81.6 kg] 81.6 kg (12/17 1539)  GENERAL:Age appropriate mild distress HEAD: Normocephalic, atraumatic.  EYES: Pupils equal, round, reactive to light.  No scleral icterus.  MOUTH: Moist mucosal membrane. NECK: Supple. No thyromegaly. No nodules. No JVD.  PULMONARY: bilateral crackles and rhocnhi CARDIOVASCULAR: S1 and S2. Regular rate and rhythm. No murmurs, rubs, or gallops.  GASTROINTESTINAL: Soft, nontender, non-distended. No masses. Positive bowel sounds. No hepatosplenomegaly.  MUSCULOSKELETAL: No swelling, clubbing, or edema.  NEUROLOGIC: Mild distress due to acute illness SKIN:intact,warm,dry   PERTINENT DATA     Infusions:  norepinephrine (LEVOPHED) Adult infusion 6 mcg/min (08/15/21 1732)   Scheduled Medications:  methylPREDNISolone (SOLU-MEDROL) injection  125 mg Intravenous Once   naLOXone (NARCAN)  injection  0.2 mg Intravenous Once   PRN Medications: docusate sodium, polyethylene glycol Hemodynamic parameters:   Intake/Output: No intake/output data recorded.  Ventilator  Settings: FiO2 (%):  [40 %] 40 %  LAB RESULTS:  Basic Metabolic Panel: Recent Labs  Lab 08/10/21 1821 08/10/21 2235 08/11/21 0137 08/11/21 0516 08/15/21 1542  NA 137  --  137 137 136  K 5.9*  --  5.3* 4.9 4.3  CL 93*  --  96* 98 99  CO2 37*  --  35* 32 32  GLUCOSE 191*  --  63* 149* 114*  BUN 10  --  13 14 30*  CREATININE 1.20* 1.12* 1.01* 0.99 1.27*  CALCIUM 9.0  --  8.2* 8.2* 7.9*  MG  --   --   --  2.3  --   PHOS  --   --   --  4.8*  --    Liver Function Tests: Recent Labs  Lab 08/10/21 1821 08/11/21 0516 08/15/21 1542  AST 72* 149* 51*  ALT 32 33 35  ALKPHOS 96 66 57  BILITOT 0.5 1.0 0.8  PROT 8.1 7.1 6.6   ALBUMIN 3.7 3.2* 3.2*   No results for input(s): LIPASE, AMYLASE in the last 168 hours. Recent Labs  Lab 08/10/21 1821 08/11/21 0516  AMMONIA 136* 20   CBC: Recent Labs  Lab 08/10/21 1821 08/10/21 2235 08/11/21 1444 08/15/21 1542  WBC 12.3*  14.6* 4.7 20.2*  NEUTROABS 9.3*  --   --  13.9*  HGB 14.2 13.1 11.9* 12.7  HCT 48.1* 43.8 38.2 42.4  MCV 98.8 98.0 93.4 99.1  PLT 252 166 120* 170   Cardiac Enzymes: No results for input(s): CKTOTAL, CKMB, CKMBINDEX, TROPONINI in the last 168 hours. BNP: Invalid input(s): POCBNP CBG: Recent Labs  Lab 08/12/21 0027 08/12/21 0345 08/12/21 0744 08/12/21 1116 08/12/21 2023  GLUCAP 140* 142* 127* 117* 159*       IMAGING RESULTS:  Imaging: DG Chest Portable 1 View  Result Date: 08/15/2021 CLINICAL DATA:  Respiratory distress. EXAM: PORTABLE CHEST 1 VIEW COMPARISON:  08/12/2021 and older exams. FINDINGS: Cardiac silhouette is normal in size. No mediastinal or hilar masses. Lungs demonstrate vascular interstitial prominence. There are no areas of lung consolidation. No convincing pleural effusion and no pneumothorax. Skeletal structures are grossly intact. IMPRESSION: 1. No convincing acute cardiopulmonary disease. Electronically Signed   By: Amie Portland M.D.   On: 08/15/2021 16:06   @[email or web address] @ DG Chest Portable 1 View  Result Date: 08/15/2021 CLINICAL DATA:  Respiratory distress. EXAM: PORTABLE CHEST 1 VIEW COMPARISON:  08/12/2021 and older exams. FINDINGS: Cardiac silhouette is normal in size. No mediastinal or hilar masses. Lungs demonstrate vascular interstitial prominence. There are no areas of lung consolidation. No convincing pleural effusion and no pneumothorax. Skeletal structures are grossly intact. IMPRESSION: 1. No convincing acute cardiopulmonary disease. Electronically Signed   By: 08/14/2021 M.D.   On: 08/15/2021 16:06        ASSESSMENT AND PLAN    -Multidisciplinary rounds held today  Acute Hypoxic  Hypercapnic Respiratory Failure   -due to  severe COPD exacerbation   - RVP  - steroids - solumedrol    - Zithromax 500 IV daily    - duoneb  -continue Full NIV support- BIPAP 14/8 - 40% FiO2 -continue Bronchodilator Therapy -  Circulatory shock   - possible cardiac vs septic   - blood cultures  - lactate  - crp  -on levophed currently   - CXR with edema caution with fluids   Acute on chronic diastilic CHF iBNP >400 CXR with pulm edema -oxygen as needed -Lasix as tolerated -follow up cardiac biomarkers ICU monitoring  Renal Failure-most likely due to ATN -follow chem 7 -follow UO -continue Foley Catheter-assess need daily Dc nephrotoxins  Bed bugs  - hygiene precautions    ID -continue IV abx as prescibed -follow up cultures  GI/Nutrition GI PROPHYLAXIS as indicated DIET-->TF's as tolerated Constipation protocol as indicated  ENDO - ICU hypoglycemic\Hyperglycemia protocol -check FSBS per protocol   ELECTROLYTES -follow labs as needed -replace as needed -pharmacy consultation   DVT/GI PRX ordered -SCDs  TRANSFUSIONS AS NEEDED MONITOR FSBS ASSESS the need for LABS as needed   Critical care provider statement:   Total critical care time: 33 minutes   Performed by: 08/17/2021 MD   Critical care time was exclusive of separately billable procedures and treating other patients.   Critical care was necessary to treat or prevent imminent or life-threatening deterioration.   Critical care was time spent personally by me on the following activities: development of treatment plan with patient and/or surrogate as well as nursing, discussions with consultants, evaluation of patient's response to treatment, examination of patient, obtaining history from patient or surrogate, ordering and performing treatments and interventions, ordering and review of laboratory studies, ordering and review of radiographic studies, pulse oximetry and re-evaluation of  patient's condition.    Karna Christmas,  M.D.  Pulmonary & Critical Care Medicine       Ottie Glazier, M.D.  Division of [place]

## 2021-08-15 NOTE — Progress Notes (Signed)
CODE SEPSIS - PHARMACY COMMUNICATION  **Broad Spectrum Antibiotics should be administered within 1 hour of Sepsis diagnosis**  Time Code Sepsis Called/Page Received: 1542  Antibiotics Ordered: cefepime, azithromycin  Time of 1st antibiotic administration: 1629    Raiford Noble ,PharmD Clinical Pharmacist  08/15/2021  3:45 PM

## 2021-08-15 NOTE — Sepsis Progress Note (Signed)
ELink monitoring sepsis protocol 

## 2021-08-15 NOTE — ED Triage Notes (Signed)
Pt BIB [facility] from home. Recent DC from hospital for pneumonia. Pt was found with wheezes. Pt was tracking upon arrival to EMS but is now more lethargic. 200 NS first. Then EMS started epi drip.  20G LAC 500NS epi drip 57mcg/min - 50cc   70/40

## 2021-08-15 NOTE — Progress Notes (Signed)
PHARMACY -  BRIEF ANTIBIOTIC NOTE   Pharmacy has received consult(s) for cefepime and vancomycin from an ED provider.  The patient's profile has been reviewed for ht/wt/allergies/indication/available labs. Pt has documented PCN allergy (SOB and swelling) but has tolerated cephalosporins in the past. Pt has recent negative MRSA PCR from 08/11/21. Discussed with ED provider, no longer ordering vancomycin.   One time order(s) placed for: Cefepime 2 g  Further antibiotics/pharmacy consults should be ordered by admitting physician if indicated.                 Thank you, Robyne Peers Zasha Belleau 08/15/2021  3:45 PM

## 2021-08-15 NOTE — Progress Notes (Signed)
Transferred to icu on bipap w/o incident

## 2021-08-16 DIAGNOSIS — J45901 Unspecified asthma with (acute) exacerbation: Secondary | ICD-10-CM

## 2021-08-16 DIAGNOSIS — J441 Chronic obstructive pulmonary disease with (acute) exacerbation: Principal | ICD-10-CM

## 2021-08-16 LAB — GLUCOSE, CAPILLARY
Glucose-Capillary: 114 mg/dL — ABNORMAL HIGH (ref 70–99)
Glucose-Capillary: 126 mg/dL — ABNORMAL HIGH (ref 70–99)
Glucose-Capillary: 130 mg/dL — ABNORMAL HIGH (ref 70–99)
Glucose-Capillary: 132 mg/dL — ABNORMAL HIGH (ref 70–99)
Glucose-Capillary: 137 mg/dL — ABNORMAL HIGH (ref 70–99)
Glucose-Capillary: 139 mg/dL — ABNORMAL HIGH (ref 70–99)
Glucose-Capillary: 208 mg/dL — ABNORMAL HIGH (ref 70–99)

## 2021-08-16 LAB — CBC WITH DIFFERENTIAL/PLATELET
Abs Immature Granulocytes: 0.14 10*3/uL — ABNORMAL HIGH (ref 0.00–0.07)
Basophils Absolute: 0 10*3/uL (ref 0.0–0.1)
Basophils Relative: 0 %
Eosinophils Absolute: 0 10*3/uL (ref 0.0–0.5)
Eosinophils Relative: 0 %
HCT: 37.9 % (ref 36.0–46.0)
Hemoglobin: 11.4 g/dL — ABNORMAL LOW (ref 12.0–15.0)
Immature Granulocytes: 1 %
Lymphocytes Relative: 6 %
Lymphs Abs: 0.8 10*3/uL (ref 0.7–4.0)
MCH: 28.8 pg (ref 26.0–34.0)
MCHC: 30.1 g/dL (ref 30.0–36.0)
MCV: 95.7 fL (ref 80.0–100.0)
Monocytes Absolute: 0.2 10*3/uL (ref 0.1–1.0)
Monocytes Relative: 1 %
Neutro Abs: 11.4 10*3/uL — ABNORMAL HIGH (ref 1.7–7.7)
Neutrophils Relative %: 92 %
Platelets: 141 10*3/uL — ABNORMAL LOW (ref 150–400)
RBC: 3.96 MIL/uL (ref 3.87–5.11)
RDW: 13.6 % (ref 11.5–15.5)
WBC: 12.5 10*3/uL — ABNORMAL HIGH (ref 4.0–10.5)
nRBC: 0 % (ref 0.0–0.2)

## 2021-08-16 LAB — BASIC METABOLIC PANEL
Anion gap: 4 — ABNORMAL LOW (ref 5–15)
BUN: 26 mg/dL — ABNORMAL HIGH (ref 8–23)
CO2: 28 mmol/L (ref 22–32)
Calcium: 8.1 mg/dL — ABNORMAL LOW (ref 8.9–10.3)
Chloride: 102 mmol/L (ref 98–111)
Creatinine, Ser: 0.76 mg/dL (ref 0.44–1.00)
GFR, Estimated: >60 mL/min (ref >60)
Glucose, Bld: 138 mg/dL — ABNORMAL HIGH (ref 70–99)
Potassium: 4.7 mmol/L (ref 3.5–5.1)
Sodium: 134 mmol/L — ABNORMAL LOW (ref 135–145)

## 2021-08-16 LAB — RESPIRATORY PANEL BY PCR

## 2021-08-16 LAB — C-REACTIVE PROTEIN: CRP: 8.6 mg/dL — ABNORMAL HIGH (ref <1.0)

## 2021-08-16 MED ORDER — AZITHROMYCIN 250 MG PO TABS
500.0000 mg | ORAL_TABLET | Freq: Every day | ORAL | Status: AC
  Administered 2021-08-16 – 2021-08-20 (×5): 500 mg via ORAL
  Filled 2021-08-16 (×2): qty 2
  Filled 2021-08-16: qty 1
  Filled 2021-08-16: qty 2
  Filled 2021-08-16: qty 1

## 2021-08-16 MED ORDER — MORPHINE SULFATE (PF) 2 MG/ML IV SOLN
2.0000 mg | INTRAVENOUS | Status: DC | PRN
  Administered 2021-08-16 – 2021-08-17 (×4): 2 mg via INTRAVENOUS
  Filled 2021-08-16 (×4): qty 1

## 2021-08-16 MED ORDER — PHENTOLAMINE MESYLATE 5 MG IJ SOLR
5.0000 mg | Freq: Once | INTRAMUSCULAR | Status: AC
  Administered 2021-08-16: 05:00:00 5 mg via SUBCUTANEOUS
  Filled 2021-08-16: qty 5

## 2021-08-16 MED ORDER — MORPHINE SULFATE (PF) 2 MG/ML IV SOLN
2.0000 mg | Freq: Once | INTRAVENOUS | Status: AC
  Administered 2021-08-16: 04:00:00 2 mg via INTRAVENOUS
  Filled 2021-08-16: qty 1

## 2021-08-16 MED ORDER — CHLORHEXIDINE GLUCONATE CLOTH 2 % EX PADS
6.0000 | MEDICATED_PAD | Freq: Every day | CUTANEOUS | Status: DC
  Administered 2021-08-16 – 2021-08-19 (×4): 6 via TOPICAL

## 2021-08-16 MED ORDER — NITROGLYCERIN 2 % TD OINT
1.0000 [in_us] | TOPICAL_OINTMENT | Freq: Three times a day (TID) | TRANSDERMAL | Status: AC
  Administered 2021-08-16 – 2021-08-17 (×6): 1 [in_us] via TOPICAL
  Filled 2021-08-16 (×7): qty 1

## 2021-08-16 MED ORDER — MELATONIN 5 MG PO TABS
2.5000 mg | ORAL_TABLET | Freq: Every day | ORAL | Status: DC
  Administered 2021-08-16 – 2021-08-24 (×9): 2.5 mg via ORAL
  Filled 2021-08-16 (×9): qty 1

## 2021-08-16 MED ORDER — SODIUM CHLORIDE 0.9 % IV SOLN
500.0000 mg | INTRAVENOUS | Status: DC
  Filled 2021-08-16: qty 5

## 2021-08-16 MED ORDER — LORAZEPAM 2 MG/ML IJ SOLN
0.5000 mg | Freq: Once | INTRAMUSCULAR | Status: AC
  Administered 2021-08-16: 06:00:00 0.5 mg via INTRAVENOUS
  Filled 2021-08-16: qty 1

## 2021-08-16 MED ORDER — METHYLPREDNISOLONE SODIUM SUCC 40 MG IJ SOLR
40.0000 mg | INTRAMUSCULAR | Status: DC
  Administered 2021-08-17 – 2021-08-19 (×3): 40 mg via INTRAVENOUS
  Filled 2021-08-16 (×3): qty 1

## 2021-08-16 MED ORDER — ENOXAPARIN SODIUM 40 MG/0.4ML IJ SOSY
40.0000 mg | PREFILLED_SYRINGE | Freq: Every day | INTRAMUSCULAR | Status: DC
  Administered 2021-08-16 – 2021-08-24 (×9): 40 mg via SUBCUTANEOUS
  Filled 2021-08-16 (×9): qty 0.4

## 2021-08-16 NOTE — Progress Notes (Signed)
eLink Physician-Brief Progress Note Patient Name: Bethany Villa DOB: 23-Oct-1955 MRN: [id number]   Date of Service  08/16/2021  HPI/Events of Note  Admitted with acute hypercapnic resp failure  eICU Interventions  Stable on AVAPS     Intervention Category Evaluation Type: New Patient Evaluation  Jacinta Shoe 08/16/2021, 12:47 AM

## 2021-08-16 NOTE — Progress Notes (Signed)
65 y/o F w/  COPD patient with hypercapnic encephalopathy required bipap never intubated now stable for medical floor with QHS bipap for 1 more night    Hospitalist service will take over care tomorrow. This is a non-billable note.

## 2021-08-16 NOTE — Progress Notes (Signed)
Infiltration at ultrasound guided iv site, Notified Ouma NP of extravasation of levophed, extravasation standing orders initiated, both iv's in that area discontinued, nitro applied to area and phentolamine administered to site.

## 2021-08-16 NOTE — Progress Notes (Signed)
CRITICAL CARE PROGRESS NOTE    Name: Bethany Villa MRN: [id number] DOB: 1956-03-25     LOS: 1   SUBJECTIVE FINDINGS & SIGNIFICANT EVENTS    Patient description:  65 yo patient with hx of bedbugs, COPD, lifelong smoking, heart failure came in from facility due to worsening doe/sob cough with s/s of AECOPD and flu like illness.    08/16/21- patient had very good improvement over past 24h.  She is lucid and able to eat entire lunch on her own. She is AAOx3 with improved vitals.  BIPAP qhs.  She will be optimized for TRH transfer today.   Lines/tubes :   Microbiology/Sepsis markers: Results for orders placed or performed during the hospital encounter of 08/15/21  Resp Panel by RT-PCR (Flu A&B, Covid) Nasopharyngeal Swab     Status: None   Collection Time: 08/15/21  3:42 PM   Specimen: Nasopharyngeal Swab; Nasopharyngeal(NP) swabs in vial transport medium  Result Value Ref Range Status   SARS Coronavirus 2 by RT PCR NEGATIVE NEGATIVE Final    Comment: (NOTE) SARS-CoV-2 target nucleic acids are NOT DETECTED.  The SARS-CoV-2 RNA is generally detectable in upper respiratory specimens during the acute phase of infection. The lowest concentration of SARS-CoV-2 viral copies this assay can detect is 138 copies/mL. A negative result does not preclude SARS-Cov-2 infection and should not be used as the sole basis for treatment or other patient management decisions. A negative result may occur with  improper specimen collection/handling, submission of specimen other than nasopharyngeal swab, presence of viral mutation(s) within the areas targeted by this assay, and inadequate number of viral copies(<138 copies/mL). A negative result must be combined with clinical observations, patient history, and  epidemiological information. The expected result is Negative.  Fact Sheet for Patients:  EntrepreneurPulse.com.au  Fact Sheet for Healthcare Providers:  IncredibleEmployment.be  This test is no t yet approved or cleared by the [place] FDA and  has been authorized for detection and/or diagnosis of SARS-CoV-2 by FDA under an Emergency Use Authorization (EUA). This EUA will remain  in effect (meaning this test can be used) for the duration of the COVID-19 declaration under Section 564(b)(1) of the Act, 21 U.S.C.section 360bbb-3(b)(1), unless the authorization is terminated  or revoked sooner.       Influenza A by PCR NEGATIVE NEGATIVE Final   Influenza B by PCR NEGATIVE NEGATIVE Final    Comment: (NOTE) The Xpert Xpress SARS-CoV-2/FLU/RSV plus assay is intended as an aid in the diagnosis of influenza from Nasopharyngeal swab specimens and should not be used as a sole basis for treatment. Nasal washings and aspirates are unacceptable for Xpert Xpress SARS-CoV-2/FLU/RSV testing.  Fact Sheet for Patients: EntrepreneurPulse.com.au  Fact Sheet for Healthcare Providers: IncredibleEmployment.be  This test is not yet approved or cleared by the [place] FDA and has been authorized for detection and/or diagnosis of SARS-CoV-2 by FDA under an Emergency Use Authorization (EUA). This EUA will remain in effect (meaning this test can be used) for the duration of the COVID-19 declaration under Section 564(b)(1) of the Act, 21 U.S.C. section 360bbb-3(b)(1), unless the authorization is terminated or revoked.  Performed at [facility], [address] [place], [place] [address]   Blood Culture (routine x 2)     Status: None (Preliminary result)   Collection Time: 08/15/21  3:42 PM   Specimen: BLOOD  Result Value Ref Range Status   Specimen Description BLOOD RIGHT [facility]  Final   Special Requests  Final    BOTTLES DRAWN AEROBIC AND ANAEROBIC Blood Culture adequate volume   Culture   Final    NO GROWTH < 24 HOURS Performed at [facility], [address] [place], [place] [address]    Report Status PENDING  Incomplete  Blood Culture (routine x 2)     Status: None (Preliminary result)   Collection Time: 08/15/21  3:42 PM   Specimen: BLOOD  Result Value Ref Range Status   Specimen Description BLOOD RIGHT WRIST  Final   Special Requests   Final    BOTTLES DRAWN AEROBIC AND ANAEROBIC Blood Culture adequate volume   Culture   Final    NO GROWTH < 24 HOURS Performed at [facility], [address] [place], [place] [address]    Report Status PENDING  Incomplete  Respiratory (~20 pathogens) panel by PCR     Status: None   Collection Time: 08/15/21  7:40 PM   Specimen: Nasopharyngeal Swab; Respiratory  Result Value Ref Range Status   Adenovirus NOT DETECTED NOT DETECTED Final   Coronavirus 229E NOT DETECTED NOT DETECTED Final    Comment: (NOTE) The Coronavirus on the Respiratory Panel, DOES NOT test for the novel  Coronavirus (2019 nCoV)    Coronavirus HKU1 NOT DETECTED NOT DETECTED Final   Coronavirus NL63 NOT DETECTED NOT DETECTED Final   Coronavirus OC43 NOT DETECTED NOT DETECTED Final   Metapneumovirus NOT DETECTED NOT DETECTED Final   Rhinovirus / Enterovirus NOT DETECTED NOT DETECTED Final   Influenza A NOT DETECTED NOT DETECTED Final   Influenza B NOT DETECTED NOT DETECTED Final   Parainfluenza Virus 1 NOT DETECTED NOT DETECTED Final   Parainfluenza Virus 2 NOT DETECTED NOT DETECTED Final   Parainfluenza Virus 3 NOT DETECTED NOT DETECTED Final   Parainfluenza Virus 4 NOT DETECTED NOT DETECTED Final   Respiratory Syncytial Virus NOT DETECTED NOT DETECTED Final   Bordetella pertussis NOT DETECTED NOT DETECTED Final   Bordetella Parapertussis NOT DETECTED NOT DETECTED Final   Chlamydophila pneumoniae NOT DETECTED NOT DETECTED Final   Mycoplasma  pneumoniae NOT DETECTED NOT DETECTED Final    Comment: Performed at [facility], [address], [place], [place] [address]  CULTURE, BLOOD (ROUTINE X 2) w Reflex to ID Panel     Status: None (Preliminary result)   Collection Time: 08/15/21  7:47 PM   Specimen: BLOOD  Result Value Ref Range Status   Specimen Description BLOOD BLOOD LEFT HAND  Final   Special Requests   Final    BOTTLES DRAWN AEROBIC ONLY Blood Culture results may not be optimal due to an inadequate volume of blood received in culture bottles   Culture   Final    NO GROWTH < 12 HOURS Performed at [facility], [address] [place], [place] [address]    Report Status PENDING  Incomplete  CULTURE, BLOOD (ROUTINE X 2) w Reflex to ID Panel     Status: None (Preliminary result)   Collection Time: 08/15/21  7:47 PM   Specimen: BLOOD  Result Value Ref Range Status   Specimen Description BLOOD BLOOD LEFT HAND  Final   Special Requests   Final    BOTTLES DRAWN AEROBIC ONLY Blood Culture results may not be optimal due to an inadequate volume of blood received in culture bottles   Culture   Final    NO GROWTH < 12 HOURS Performed at [facility], [address], [place], [place] [address]  Report Status PENDING  Incomplete  MRSA Next Gen by PCR, Nasal     Status: None   Collection Time: 08/15/21  9:20 PM   Specimen: Nasal Mucosa; Nasal Swab  Result Value Ref Range Status   MRSA by PCR Next Gen NOT DETECTED NOT DETECTED Final    Comment: (NOTE) The GeneXpert MRSA Assay (FDA approved for NASAL specimens only), is one component of a comprehensive MRSA colonization surveillance program. It is not intended to diagnose MRSA infection nor to guide or monitor treatment for MRSA infections. Test performance is not FDA approved in patients less than 26 years old. Performed at [facility], [address] [place], [place] [address]     Anti-infectives:  Anti-infectives (From admission,  onward)    Start     Dose/Rate Route Frequency Ordered Stop   08/16/21 1630  azithromycin (ZITHROMAX) 500 mg in sodium chloride 0.9 % 250 mL IVPB        500 mg 250 mL/hr over 60 Minutes Intravenous Every 24 hours 08/16/21 0237     08/15/21 1545  vancomycin (VANCOCIN) IVPB 1000 mg/200 mL premix  Status:  Discontinued        1,000 mg 200 mL/hr over 60 Minutes Intravenous  Once 08/15/21 1542 08/15/21 1548   08/15/21 1545  ceFEPIme (MAXIPIME) 2 g in sodium chloride 0.9 % 100 mL IVPB        2 g 200 mL/hr over 30 Minutes Intravenous  Once 08/15/21 1542 08/15/21 1648   08/15/21 1545  azithromycin (ZITHROMAX) 500 mg in sodium chloride 0.9 % 250 mL IVPB        500 mg 250 mL/hr over 60 Minutes Intravenous  Once 08/15/21 1542 08/15/21 1732        PAST MEDICAL HISTORY   Past Medical History:  Diagnosis Date   Anxiety    Asthma    Chronic pain    lower back   Chronic systolic CHF (congestive heart failure) ([place]) 09/22/2019   COPD (chronic obstructive pulmonary disease) ([facility])    Depression    DJD (degenerative joint disease)    Fibromyalgia    GERD (gastroesophageal reflux disease)    Headache    Hyperlipidemia    Lower extremity edema    On home oxygen therapy    has not been on since 2016   Panic attack    Shortness of breath dyspnea    Sleep disorder      SURGICAL HISTORY   Past Surgical History:  Procedure Laterality Date   ABDOMINAL SURGERY     ANKLE SURGERY     BACK SURGERY     lower   CARPAL TUNNEL RELEASE     CESAREAN SECTION     I & D KNEE WITH POLY EXCHANGE N/A 06/11/2020   Procedure: IRRIGATION AND DEBRIDEMENT KNEE WITH POLY EXCHANGE;  Surgeon: Corky Mull, MD;  Location: [facility] ORS;  Service: Orthopedics;  Laterality: N/A;   KNEE SURGERY     TONSILLECTOMY     TOTAL KNEE ARTHROPLASTY Left 04/14/2016   Procedure: TOTAL KNEE ARTHROPLASTY;  Surgeon: Earnestine Leys, MD;  Location: [facility] ORS;  Service: Orthopedics;  Laterality: Left;   TUBAL LIGATION       FAMILY  HISTORY   Family History  Problem Relation Age of Onset   Cancer Mother        beast     SOCIAL HISTORY   Social History   Tobacco Use   Smoking status: Every Day    Packs/day:  1.00    Years: 45.00    Pack years: 45.00    Types: Cigarettes   Smokeless tobacco: Never  Vaping Use   Vaping Use: Never used  Substance Use Topics   Alcohol use: Yes    Alcohol/week: 0.0 standard drinks    Comment: rare   Drug use: No     MEDICATIONS   Current Medication:  Current Facility-Administered Medications:    0.9 %  sodium chloride infusion, 250 mL, Intravenous, Continuous, Ouma, Bing Neighbors, NP, Held at 08/15/21 1952   azithromycin (ZITHROMAX) 500 mg in sodium chloride 0.9 % 250 mL IVPB, 500 mg, Intravenous, Q24H, Ouma, Bing Neighbors, NP   budesonide (PULMICORT) nebulizer solution 0.25 mg, 0.25 mg, Nebulization, BID, Ouma, Bing Neighbors, NP, 0.25 mg at 08/16/21 0700   Chlorhexidine Gluconate Cloth 2 % PADS 6 each, 6 each, Topical, Daily, Ouma, Bing Neighbors, NP   docusate sodium (COLACE) capsule 100 mg, 100 mg, Oral, BID PRN, Lanney Gins, Julyanna Scholle, MD   insulin aspart (novoLOG) injection 0-20 Units, 0-20 Units, Subcutaneous, Q4H, Ouma, Bing Neighbors, NP, 3 Units at 08/16/21 0401   ipratropium-albuterol (DUONEB) 0.5-2.5 (3) MG/3ML nebulizer solution 3 mL, 3 mL, Nebulization, Q6H PRN, Ouma, Bing Neighbors, NP   ipratropium-albuterol (DUONEB) 0.5-2.5 (3) MG/3ML nebulizer solution 3 mL, 3 mL, Nebulization, Q6H, Ouma, Bing Neighbors, NP, 3 mL at 08/16/21 0700   methylPREDNISolone sodium succinate (SOLU-MEDROL) 40 mg/mL injection 40 mg, 40 mg, Intravenous, Q12H, Ouma, Bing Neighbors, NP   nitroGLYCERIN (NITROGLYN) 2 % ointment 1 inch, 1 inch, Topical, Q8H, Ouma, Bing Neighbors, NP, 1 inch at 08/16/21 0527   norepinephrine (LEVOPHED) 4mg  in 236mL (0.016 mg/mL) premix infusion, 2-10 mcg/min, Intravenous, Titrated, Ouma, Bing Neighbors, NP, Stopped at 08/16/21  0401   polyethylene glycol (MIRALAX / GLYCOLAX) packet 17 g, 17 g, Oral, Daily PRN, Ottie Glazier, MD    ALLERGIES   Gabapentin, Mobic [meloxicam], Penicillins, and Sulfa antibiotics    REVIEW OF SYSTEMS     10 point ros done and is negative except generalized pain   PHYSICAL EXAMINATION   Vital Signs: Temp:  [97.5 F (36.4 C)-98.7 F (37.1 C)] 98.7 F (37.1 C) (12/18 0400) Pulse Rate:  [63-100] 76 (12/18 0600) Resp:  [12-20] 15 (12/18 0600) BP: (72-115)/(39-82) 108/70 (12/18 0600) SpO2:  [88 %-100 %] 93 % (12/18 0600) FiO2 (%):  [40 %] 40 % (12/18 0700) Weight:  [81.6 kg] 81.6 kg (12/17 1539)  GENERAL:Age appropriate mild distress HEAD: Normocephalic, atraumatic.  EYES: Pupils equal, round, reactive to light.  No scleral icterus.  MOUTH: Moist mucosal membrane. NECK: Supple. No thyromegaly. No nodules. No JVD.  PULMONARY: bilateral  rhocnhi improved from yesterday  CARDIOVASCULAR: S1 and S2. Regular rate and rhythm. No murmurs, rubs, or gallops.  GASTROINTESTINAL: Soft, nontender, non-distended. No masses. Positive bowel sounds. No hepatosplenomegaly.  MUSCULOSKELETAL: No swelling, clubbing, or edema.  NEUROLOGIC: Mild distress due to acute illness SKIN:intact,warm,dry   PERTINENT DATA     Infusions:  sodium chloride Stopped (08/15/21 1952)   azithromycin     norepinephrine (LEVOPHED) Adult infusion Stopped (08/16/21 0401)   Scheduled Medications:  budesonide (PULMICORT) nebulizer solution  0.25 mg Nebulization BID   Chlorhexidine Gluconate Cloth  6 each Topical Daily   insulin aspart  0-20 Units Subcutaneous Q4H   ipratropium-albuterol  3 mL Nebulization Q6H   methylPREDNISolone (SOLU-MEDROL) injection  40 mg Intravenous Q12H   nitroGLYCERIN  1 inch Topical Q8H   PRN Medications: docusate sodium, ipratropium-albuterol, polyethylene glycol Hemodynamic parameters:  Intake/Output: 12/17 0701 - 12/18 0700 In: [id number] [I.V.:207; IV Piggyback:1250] Out:  -   Ventilator  Settings: FiO2 (%):  [40 %] 40 %  LAB RESULTS:  Basic Metabolic Panel: Recent Labs  Lab 08/10/21 1821 08/10/21 2235 08/11/21 0137 08/11/21 0516 08/15/21 1542 08/15/21 1947 08/16/21 0411  NA 137  --  137 137 136  --  134*  K 5.9*  --  5.3* 4.9 4.3  --  4.7  CL 93*  --  96* 98 99  --  102  CO2 37*  --  35* 32 32  --  28  GLUCOSE 191*  --  63* 149* 114*  --  138*  BUN 10  --  13 14 30*  --  26*  CREATININE 1.20* 1.12* 1.01* 0.99 1.27*  --  0.76  CALCIUM 9.0  --  8.2* 8.2* 7.9*  --  8.1*  MG  --   --   --  2.3  --  2.1  --   PHOS  --   --   --  4.8*  --   --   --     Liver Function Tests: Recent Labs  Lab 08/10/21 1821 08/11/21 0516 08/15/21 1542  AST 72* 149* 51*  ALT 32 33 35  ALKPHOS 96 66 57  BILITOT 0.5 1.0 0.8  PROT 8.1 7.1 6.6  ALBUMIN 3.7 3.2* 3.2*    No results for input(s): LIPASE, AMYLASE in the last 168 hours. Recent Labs  Lab 08/10/21 1821 08/11/21 0516 08/15/21 1710  AMMONIA 136* 20 33    CBC: Recent Labs  Lab 08/10/21 1821 08/10/21 2235 08/11/21 1444 08/15/21 1542 08/16/21 0411  WBC 12.3* 14.6* 4.7 20.2* 12.5*  NEUTROABS 9.3*  --   --  13.9* 11.4*  HGB 14.2 13.1 11.9* 12.7 11.4*  HCT 48.1* 43.8 38.2 42.4 37.9  MCV 98.8 98.0 93.4 99.1 95.7  PLT 252 166 120* 170 141*    Cardiac Enzymes: No results for input(s): CKTOTAL, CKMB, CKMBINDEX, TROPONINI in the last 168 hours. BNP: Invalid input(s): POCBNP CBG: Recent Labs  Lab 08/12/21 2023 08/15/21 1956 08/16/21 0010 08/16/21 0341 08/16/21 0746  GLUCAP 159* 142* 139* 132* 114*        IMAGING RESULTS:  Imaging: DG Chest Portable 1 View  Result Date: 08/15/2021 CLINICAL DATA:  Respiratory distress. EXAM: PORTABLE CHEST 1 VIEW COMPARISON:  08/12/2021 and older exams. FINDINGS: Cardiac silhouette is normal in size. No mediastinal or hilar masses. Lungs demonstrate vascular interstitial prominence. There are no areas of lung consolidation. No convincing pleural  effusion and no pneumothorax. Skeletal structures are grossly intact. IMPRESSION: 1. No convincing acute cardiopulmonary disease. Electronically Signed   By: Lajean Manes M.D.   On: 08/15/2021 16:06   @PROBHOSP @ DG Chest Portable 1 View  Result Date: 08/15/2021 CLINICAL DATA:  Respiratory distress. EXAM: PORTABLE CHEST 1 VIEW COMPARISON:  08/12/2021 and older exams. FINDINGS: Cardiac silhouette is normal in size. No mediastinal or hilar masses. Lungs demonstrate vascular interstitial prominence. There are no areas of lung consolidation. No convincing pleural effusion and no pneumothorax. Skeletal structures are grossly intact. IMPRESSION: 1. No convincing acute cardiopulmonary disease. Electronically Signed   By: Lajean Manes M.D.   On: 08/15/2021 16:06        ASSESSMENT AND PLAN    -Multidisciplinary rounds held today  Acute Hypoxic Hypercapnic Respiratory Failure   -due to  severe COPD exacerbation   - RVP- negative   - steroids - solumedrol    -  Zithromax 500 IV daily    - duoneb  -continue Full NIV support- BIPAP 14/8 - 40% FiO2 -continue Bronchodilator Therapy -off vasopressors  Circulatory shock   - possible cardiac vs septic   - blood cultures  - lactate  - crp  -on levophed currently   - CXR with edema caution with fluids   Acute on chronic diastilic CHF iBNP 0000000 CXR with pulm edema -oxygen as needed -Lasix as tolerated -follow up cardiac biomarkers ICU monitoring  Renal Failure-most likely due to ATN -follow chem 7 -follow UO -continue Foley Catheter-assess need daily Dc nephrotoxins  Bed bugs  - hygiene precautions    ID -continue IV abx as prescibed -follow up cultures  GI/Nutrition GI PROPHYLAXIS as indicated DIET-->TF's as tolerated Constipation protocol as indicated  ENDO - ICU hypoglycemic\Hyperglycemia protocol -check FSBS per protocol   ELECTROLYTES -follow labs as needed -replace as needed -pharmacy consultation   DVT/GI  PRX ordered -SCDs  TRANSFUSIONS AS NEEDED MONITOR FSBS ASSESS the need for LABS as needed   Critical care provider statement:   Total critical care time: 33 minutes   Performed by: Lanney Gins MD   Critical care time was exclusive of separately billable procedures and treating other patients.   Critical care was necessary to treat or prevent imminent or life-threatening deterioration.   Critical care was time spent personally by me on the following activities: development of treatment plan with patient and/or surrogate as well as nursing, discussions with consultants, evaluation of patient's response to treatment, examination of patient, obtaining history from patient or surrogate, ordering and performing treatments and interventions, ordering and review of laboratory studies, ordering and review of radiographic studies, pulse oximetry and re-evaluation of patient's condition.    Ottie Glazier, M.D.  Pulmonary & Critical Care Medicine       Ottie Glazier, M.D.  Division of [facility]

## 2021-08-16 NOTE — Progress Notes (Signed)
PHARMACIST - PHYSICIAN COMMUNICATION ° °CONCERNING: Antibiotic IV to Oral Route Change Policy ° °RECOMMENDATION: °This patient is receiving azithromycin by the intravenous route.  Based on criteria approved by the Pharmacy and Therapeutics Committee, the antibiotic(s) is/are being converted to the equivalent oral dose form(s). ° ° °DESCRIPTION: °These criteria include: °Patient being treated for a respiratory tract infection, urinary tract infection, cellulitis or clostridium difficile associated diarrhea if on metronidazole °The patient is not neutropenic and does not exhibit a GI malabsorption state °The patient is eating (either orally or via tube) and/or has been taking other orally administered medications for a least 24 hours °The patient is improving clinically and has a Tmax < 100.5 ° °If you have questions about this conversion, please contact the Pharmacy Department  ° °Zimri Brennen B Alonzo Loving  °08/16/21  °  °

## 2021-08-16 NOTE — Progress Notes (Signed)
Pt taken off bipap and placed on 4lpm [place], sats 96%, respiratory rate 16/min, no shortness of breath noted, RN notified.

## 2021-08-17 LAB — GLUCOSE, CAPILLARY
Glucose-Capillary: 154 mg/dL — ABNORMAL HIGH (ref 70–99)
Glucose-Capillary: 89 mg/dL (ref 70–99)
Glucose-Capillary: 104 mg/dL — ABNORMAL HIGH (ref 70–99)
Glucose-Capillary: 163 mg/dL — ABNORMAL HIGH (ref 70–99)
Glucose-Capillary: 181 mg/dL — ABNORMAL HIGH (ref 70–99)
Glucose-Capillary: 132 mg/dL — ABNORMAL HIGH (ref 70–99)

## 2021-08-17 LAB — HEMOGLOBIN A1C
Hgb A1c MFr Bld: 5.3 % (ref 4.8–5.6)
Mean Plasma Glucose: 105 mg/dL

## 2021-08-17 MED ORDER — IPRATROPIUM-ALBUTEROL 0.5-2.5 (3) MG/3ML IN SOLN
3.0000 mL | Freq: Three times a day (TID) | RESPIRATORY_TRACT | Status: DC
  Administered 2021-08-17 – 2021-08-19 (×6): 3 mL via RESPIRATORY_TRACT
  Filled 2021-08-17 (×5): qty 3

## 2021-08-17 MED ORDER — LIDOCAINE 5 % EX PTCH
1.0000 | MEDICATED_PATCH | CUTANEOUS | Status: DC
  Administered 2021-08-17 – 2021-08-18 (×2): 1 via TRANSDERMAL
  Filled 2021-08-17 (×9): qty 1

## 2021-08-17 MED ORDER — OXYCODONE HCL 5 MG PO TABS
5.0000 mg | ORAL_TABLET | ORAL | Status: DC | PRN
  Administered 2021-08-17 – 2021-08-25 (×38): 5 mg via ORAL
  Filled 2021-08-17 (×39): qty 1

## 2021-08-17 MED ORDER — PANTOPRAZOLE SODIUM 40 MG PO TBEC
40.0000 mg | DELAYED_RELEASE_TABLET | Freq: Every day | ORAL | Status: DC
  Administered 2021-08-17 – 2021-08-25 (×9): 40 mg via ORAL
  Filled 2021-08-17 (×9): qty 1

## 2021-08-17 MED ORDER — ARFORMOTEROL TARTRATE 15 MCG/2ML IN NEBU
15.0000 ug | INHALATION_SOLUTION | Freq: Two times a day (BID) | RESPIRATORY_TRACT | Status: DC
  Administered 2021-08-18 – 2021-08-25 (×15): 15 ug via RESPIRATORY_TRACT
  Filled 2021-08-17 (×18): qty 2

## 2021-08-17 MED ORDER — MORPHINE SULFATE (PF) 2 MG/ML IV SOLN
2.0000 mg | INTRAVENOUS | Status: DC | PRN
  Administered 2021-08-17 – 2021-08-19 (×6): 2 mg via INTRAVENOUS
  Filled 2021-08-17 (×6): qty 1

## 2021-08-17 MED ORDER — LACTULOSE 10 GM/15ML PO SOLN
20.0000 g | Freq: Two times a day (BID) | ORAL | Status: DC
  Administered 2021-08-17: 11:00:00 20 g via ORAL
  Filled 2021-08-17 (×9): qty 30

## 2021-08-17 MED ORDER — LOSARTAN POTASSIUM 50 MG PO TABS
50.0000 mg | ORAL_TABLET | Freq: Every day | ORAL | Status: DC
  Administered 2021-08-17 – 2021-08-21 (×5): 50 mg via ORAL
  Filled 2021-08-17 (×5): qty 1

## 2021-08-17 MED ORDER — FUROSEMIDE 10 MG/ML IJ SOLN
40.0000 mg | Freq: Once | INTRAMUSCULAR | Status: AC
  Administered 2021-08-17: 16:00:00 40 mg via INTRAVENOUS
  Filled 2021-08-17: qty 4

## 2021-08-17 MED ORDER — GUAIFENESIN-DM 100-10 MG/5ML PO SYRP
5.0000 mL | ORAL_SOLUTION | ORAL | Status: DC | PRN
  Administered 2021-08-18 – 2021-08-20 (×2): 5 mL via ORAL
  Filled 2021-08-17 (×2): qty 5

## 2021-08-17 MED ORDER — ALBUTEROL SULFATE (2.5 MG/3ML) 0.083% IN NEBU: 2.5000 mg | INHALATION_SOLUTION | RESPIRATORY_TRACT | Status: DC | PRN

## 2021-08-17 MED ORDER — CLOPIDOGREL BISULFATE 75 MG PO TABS
75.0000 mg | ORAL_TABLET | Freq: Every day | ORAL | Status: DC
  Administered 2021-08-17 – 2021-08-25 (×9): 75 mg via ORAL
  Filled 2021-08-17 (×9): qty 1

## 2021-08-17 NOTE — Progress Notes (Signed)
PROGRESS NOTE    Bethany Villa  IEP:[id number] DOB: 1955/12/18 DOA: 08/15/2021 PCP: [name], MD    Brief Narrative:  65 yo patient with hx of bedbugs, COPD, lifelong smoking, heart failure came in from facility due to worsening doe/sob cough with s/s of AECOPD and flu like illness.  Admitted by PCCM given severe respiratory distress and need for NIPPV.  Stabilized in ICU standpoint.  Did not require endotracheal intubation.  Respiratory status much improved.  Transferred to hospitalist service on 12/18.   Assessment & Plan:   Principal Problem:   Acute exacerbation of COPD with asthma (HCC)  Acute exacerbation of COPD Acute hypoxic and hypercarbic respiratory failure Initially required NIPPV Now weaned off, transferred to hospitalist service 12/18 Plan: Zithromax Steroids DuoNebs Bronchodilators As needed BiPAP Transfer to medical telemetry Wean oxygen as tolerated  Circulatory shock Now weaned off Levophed Plan: Transfer to floor Follow cultures, NGTD Monitor vitals and fever curve  Acute on chronic diastolic congestive heart failure Elevated BNP, CXR with pulmonary edema Plan: Lasix as tolerated  AKI Suspect ATN in setting of acute illness Avoid nephrotoxins Continue Foley catheter for now, voiding trial tomorrow  Bed bugs Hygiene precautions   DVT prophylaxis: SQ Lovenox Code Status: Full Family Communication: None today.  Offered to call the patient declined Disposition Plan: Status is: Inpatient  Remains inpatient appropriate because: Acute hypoxic hypercarbic respiratory failure       Level of care: Telemetry Medical  Consultants:  None  Procedures:  None  Antimicrobials: Azithromycin   Subjective: Patient seen and examined.  Remains in ICU.  Feels much better.  Respiratory status improved.  Objective: Vitals:   08/17/21 1000 08/17/21 1030 08/17/21 1100 08/17/21 1200  BP: 122/62     Pulse: 78 85 89 80  Resp: 18 (!)  Temp:      TempSrc:      SpO2: 93% 94% 97% 98%  Weight:      Height:        Intake/Output Summary (Last 24 hours) at 08/17/2021 1423 Last data filed at 08/17/2021 1100 Gross per 24 hour  Intake 840 ml  Output 1600 ml  Net -760 ml   Filed Weights   08/15/21 1539 08/17/21 0500  Weight: 81.6 kg 83.6 kg    Examination:  General exam: No acute distress.  Appears frail Respiratory system: Bibasilar crackles.  Normal work of breathing.  3 L Cardiovascular system: S1-S2, RRR, no murmurs, trace pitting edema Gastrointestinal system: Soft, NT/ND, normal bowel sounds Central nervous system: Alert and oriented. No focal neurological deficits. Extremities: Symmetric 5 x 5 power. Skin: No rashes, lesions or ulcers Psychiatry: Judgement and insight appear normal. Mood & affect appropriate.     Data Reviewed: I have personally reviewed following labs and imaging studies  CBC: Recent Labs  Lab 08/10/21 1821 08/10/21 2235 08/11/21 1444 08/15/21 1542 08/16/21 0411  WBC 12.3* 14.6* 4.7 20.2* 12.5*  NEUTROABS 9.3*  --   --  13.9* 11.4*  HGB 14.2 13.1 11.9* 12.7 11.4*  HCT 48.1* 43.8 38.2 42.4 37.9  MCV 98.8 98.0 93.4 99.1 95.7  PLT 252 166 120* 170 141*   Basic Metabolic Panel: Recent Labs  Lab 08/10/21 1821 08/10/21 2235 08/11/21 0137 08/11/21 0516 08/15/21 1542 08/15/21 1947 08/16/21 0411  NA 137  --  137 137 136  --  134*  K 5.9*  --  5.3* 4.9 4.3  --  4.7  CL 93*  --  96*  98 99  --  102  CO2 37*  --  35* 32 32  --  28  GLUCOSE 191*  --  63* 149* 114*  --  138*  BUN 10  --  13 14 30*  --  26*  CREATININE 1.20* 1.12* 1.01* 0.99 1.27*  --  0.76  CALCIUM 9.0  --  8.2* 8.2* 7.9*  --  8.1*  MG  --   --   --  2.3  --  2.1  --   PHOS  --   --   --  4.8*  --   --   --    GFR: Estimated Creatinine Clearance: 74.4 mL/min (by C-G formula based on SCr of 0.76 mg/dL). Liver Function Tests: Recent Labs  Lab 08/10/21 1821 08/11/21 0516 08/15/21 1542  AST 72*  149* 51*  ALT 32 33 35  ALKPHOS 96 66 57  BILITOT 0.5 1.0 0.8  PROT 8.1 7.1 6.6  ALBUMIN 3.7 3.2* 3.2*   No results for input(s): LIPASE, AMYLASE in the last 168 hours. Recent Labs  Lab 08/10/21 1821 08/11/21 0516 08/15/21 1710  AMMONIA 136* 20 33   Coagulation Profile: Recent Labs  Lab 08/15/21 1542  INR 1.0   Cardiac Enzymes: No results for input(s): CKTOTAL, CKMB, CKMBINDEX, TROPONINI in the last 168 hours. BNP (last 3 results) No results for input(s): PROBNP in the last 8760 hours. HbA1C: Recent Labs    08/15/21 1946  HGBA1C 5.3   CBG: Recent Labs  Lab 08/16/21 1947 08/16/21 2340 08/17/21 0324 08/17/21 0809 08/17/21 1128  GLUCAP 137* 130* 89 104* 163*   Lipid Profile: No results for input(s): CHOL, HDL, LDLCALC, TRIG, CHOLHDL, LDLDIRECT in the last 72 hours. Thyroid Function Tests: No results for input(s): TSH, T4TOTAL, FREET4, T3FREE, THYROIDAB in the last 72 hours. Anemia Panel: No results for input(s): VITAMINB12, FOLATE, FERRITIN, TIBC, IRON, RETICCTPCT in the last 72 hours. Sepsis Labs: Recent Labs  Lab 08/10/21 2203 08/10/21 2314 08/11/21 0134 08/11/21 0137 08/12/21 0539 08/15/21 1542 08/15/21 1817  PROCALCITON 1.01  --   --  1.65 0.93  --   --   LATICACIDVEN  --  0.9 0.7  --   --  0.8 1.2    Recent Results (from the past 240 hour(s))  Culture, blood (routine x 2) Call MD if unable to obtain prior to antibiotics being given     Status: None   Collection Time: 08/10/21 10:35 PM   Specimen: BLOOD LEFT FOREARM  Result Value Ref Range Status   Specimen Description BLOOD LEFT FOREARM  Final   Special Requests   Final    BOTTLES DRAWN AEROBIC AND ANAEROBIC Blood Culture results may not be optimal due to an inadequate volume of blood received in culture bottles   Culture   Final    NO GROWTH 5 DAYS Performed at [facility], [address], [place], [place] [address]    Report Status 08/15/2021 FINAL  Final  Culture, blood  (routine x 2) Call MD if unable to obtain prior to antibiotics being given     Status: None   Collection Time: 08/10/21 10:40 PM   Specimen: BLOOD LEFT FOREARM  Result Value Ref Range Status   Specimen Description BLOOD LEFT FOREARM  Final   Special Requests   Final    BOTTLES DRAWN AEROBIC AND ANAEROBIC Blood Culture results may not be optimal due to an inadequate volume of blood received in culture bottles   Culture  Final    NO GROWTH 5 DAYS Performed at [facility], [address] [place], [place] [address]    Report Status 08/15/2021 FINAL  Final  Resp Panel by RT-PCR (Flu A&B, Covid) Nasopharyngeal Swab     Status: None   Collection Time: 08/11/21  5:29 AM   Specimen: Nasopharyngeal Swab; Nasopharyngeal(NP) swabs in vial transport medium  Result Value Ref Range Status   SARS Coronavirus 2 by RT PCR NEGATIVE NEGATIVE Final    Comment: (NOTE) SARS-CoV-2 target nucleic acids are NOT DETECTED.  The SARS-CoV-2 RNA is generally detectable in upper respiratory specimens during the acute phase of infection. The lowest concentration of SARS-CoV-2 viral copies this assay can detect is 138 copies/mL. A negative result does not preclude SARS-Cov-2 infection and should not be used as the sole basis for treatment or other patient management decisions. A negative result may occur with  improper specimen collection/handling, submission of specimen other than nasopharyngeal swab, presence of viral mutation(s) within the areas targeted by this assay, and inadequate number of viral copies(<138 copies/mL). A negative result must be combined with clinical observations, patient history, and epidemiological information. The expected result is Negative.  Fact Sheet for Patients:  BloggerCourse.com  Fact Sheet for Healthcare Providers:  SeriousBroker.it  This test is no t yet approved or cleared by the [place] FDA and  has been  authorized for detection and/or diagnosis of SARS-CoV-2 by FDA under an Emergency Use Authorization (EUA). This EUA will remain  in effect (meaning this test can be used) for the duration of the COVID-19 declaration under Section 564(b)(1) of the Act, 21 U.S.C.section 360bbb-3(b)(1), unless the authorization is terminated  or revoked sooner.       Influenza A by PCR NEGATIVE NEGATIVE Final   Influenza B by PCR NEGATIVE NEGATIVE Final    Comment: (NOTE) The Xpert Xpress SARS-CoV-2/FLU/RSV plus assay is intended as an aid in the diagnosis of influenza from Nasopharyngeal swab specimens and should not be used as a sole basis for treatment. Nasal washings and aspirates are unacceptable for Xpert Xpress SARS-CoV-2/FLU/RSV testing.  Fact Sheet for Patients: BloggerCourse.com  Fact Sheet for Healthcare Providers: SeriousBroker.it  This test is not yet approved or cleared by the [place] FDA and has been authorized for detection and/or diagnosis of SARS-CoV-2 by FDA under an Emergency Use Authorization (EUA). This EUA will remain in effect (meaning this test can be used) for the duration of the COVID-19 declaration under Section 564(b)(1) of the Act, 21 U.S.C. section 360bbb-3(b)(1), unless the authorization is terminated or revoked.  Performed at [facility], [address] [place], [place] [address]   Respiratory (~20 pathogens) panel by PCR     Status: None   Collection Time: 08/11/21  5:29 AM   Specimen: Nasopharyngeal Swab; Respiratory  Result Value Ref Range Status   Adenovirus NOT DETECTED NOT DETECTED Final   Coronavirus 229E NOT DETECTED NOT DETECTED Final    Comment: (NOTE) The Coronavirus on the Respiratory Panel, DOES NOT test for the novel  Coronavirus (2019 nCoV)    Coronavirus HKU1 NOT DETECTED NOT DETECTED Final   Coronavirus NL63 NOT DETECTED NOT DETECTED Final   Coronavirus OC43 NOT DETECTED NOT  DETECTED Final   Metapneumovirus NOT DETECTED NOT DETECTED Final   Rhinovirus / Enterovirus NOT DETECTED NOT DETECTED Final   Influenza A NOT DETECTED NOT DETECTED Final   Influenza B NOT DETECTED NOT DETECTED Final   Parainfluenza Virus 1 NOT DETECTED NOT DETECTED Final  Parainfluenza Virus 2 NOT DETECTED NOT DETECTED Final   Parainfluenza Virus 3 NOT DETECTED NOT DETECTED Final   Parainfluenza Virus 4 NOT DETECTED NOT DETECTED Final   Respiratory Syncytial Virus NOT DETECTED NOT DETECTED Final   Bordetella pertussis NOT DETECTED NOT DETECTED Final   Bordetella Parapertussis NOT DETECTED NOT DETECTED Final   Chlamydophila pneumoniae NOT DETECTED NOT DETECTED Final   Mycoplasma pneumoniae NOT DETECTED NOT DETECTED Final    Comment: Performed at [facility], [address] [place], [place] [address]  MRSA Next Gen by PCR, Nasal     Status: None   Collection Time: 08/11/21 12:30 PM   Specimen: Nasal Mucosa; Nasal Swab  Result Value Ref Range Status   MRSA by PCR Next Gen NOT DETECTED NOT DETECTED Final    Comment: (NOTE) The GeneXpert MRSA Assay (FDA approved for NASAL specimens only), is one component of a comprehensive MRSA colonization surveillance program. It is not intended to diagnose MRSA infection nor to guide or monitor treatment for MRSA infections. Test performance is not FDA approved in patients less than 33 years old. Performed at [facility], [address] [place], [place] [address]   Resp Panel by RT-PCR (Flu A&B, Covid) Nasopharyngeal Swab     Status: None   Collection Time: 08/15/21  3:42 PM   Specimen: Nasopharyngeal Swab; Nasopharyngeal(NP) swabs in vial transport medium  Result Value Ref Range Status   SARS Coronavirus 2 by RT PCR NEGATIVE NEGATIVE Final    Comment: (NOTE) SARS-CoV-2 target nucleic acids are NOT DETECTED.  The SARS-CoV-2 RNA is generally detectable in upper respiratory specimens during the acute phase of infection. The  lowest concentration of SARS-CoV-2 viral copies this assay can detect is 138 copies/mL. A negative result does not preclude SARS-Cov-2 infection and should not be used as the sole basis for treatment or other patient management decisions. A negative result may occur with  improper specimen collection/handling, submission of specimen other than nasopharyngeal swab, presence of viral mutation(s) within the areas targeted by this assay, and inadequate number of viral copies(<138 copies/mL). A negative result must be combined with clinical observations, patient history, and epidemiological information. The expected result is Negative.  Fact Sheet for Patients:  BloggerCourse.com  Fact Sheet for Healthcare Providers:  SeriousBroker.it  This test is no t yet approved or cleared by the [place] FDA and  has been authorized for detection and/or diagnosis of SARS-CoV-2 by FDA under an Emergency Use Authorization (EUA). This EUA will remain  in effect (meaning this test can be used) for the duration of the COVID-19 declaration under Section 564(b)(1) of the Act, 21 U.S.C.section 360bbb-3(b)(1), unless the authorization is terminated  or revoked sooner.       Influenza A by PCR NEGATIVE NEGATIVE Final   Influenza B by PCR NEGATIVE NEGATIVE Final    Comment: (NOTE) The Xpert Xpress SARS-CoV-2/FLU/RSV plus assay is intended as an aid in the diagnosis of influenza from Nasopharyngeal swab specimens and should not be used as a sole basis for treatment. Nasal washings and aspirates are unacceptable for Xpert Xpress SARS-CoV-2/FLU/RSV testing.  Fact Sheet for Patients: BloggerCourse.com  Fact Sheet for Healthcare Providers: SeriousBroker.it  This test is not yet approved or cleared by the [place] FDA and has been authorized for detection and/or diagnosis of SARS-CoV-2 by FDA under  an Emergency Use Authorization (EUA). This EUA will remain in effect (meaning this test can be used) for the duration of the COVID-19 declaration under  Section 564(b)(1) of the Act, 21 U.S.C. section 360bbb-3(b)(1), unless the authorization is terminated or revoked.  Performed at [facility], [address] [place], [place] [address]   Blood Culture (routine x 2)     Status: None (Preliminary result)   Collection Time: 08/15/21  3:42 PM   Specimen: BLOOD  Result Value Ref Range Status   Specimen Description BLOOD RIGHT [facility]  Final   Special Requests   Final    BOTTLES DRAWN AEROBIC AND ANAEROBIC Blood Culture adequate volume   Culture   Final    NO GROWTH 2 DAYS Performed at [facility], [address], [place], [place] [address]    Report Status PENDING  Incomplete  Blood Culture (routine x 2)     Status: None (Preliminary result)   Collection Time: 08/15/21  3:42 PM   Specimen: BLOOD  Result Value Ref Range Status   Specimen Description BLOOD RIGHT WRIST  Final   Special Requests   Final    BOTTLES DRAWN AEROBIC AND ANAEROBIC Blood Culture adequate volume   Culture   Final    NO GROWTH 2 DAYS Performed at [facility] [address], [place], [place] [address]    Report Status PENDING  Incomplete  Respiratory (~20 pathogens) panel by PCR     Status: None   Collection Time: 08/15/21  7:40 PM   Specimen: Nasopharyngeal Swab; Respiratory  Result Value Ref Range Status   Adenovirus NOT DETECTED NOT DETECTED Final   Coronavirus 229E NOT DETECTED NOT DETECTED Final    Comment: (NOTE) The Coronavirus on the Respiratory Panel, DOES NOT test for the novel  Coronavirus (2019 nCoV)    Coronavirus HKU1 NOT DETECTED NOT DETECTED Final   Coronavirus NL63 NOT DETECTED NOT DETECTED Final   Coronavirus OC43 NOT DETECTED NOT DETECTED Final   Metapneumovirus NOT DETECTED NOT DETECTED Final   Rhinovirus / Enterovirus NOT DETECTED NOT DETECTED Final    Influenza A NOT DETECTED NOT DETECTED Final   Influenza B NOT DETECTED NOT DETECTED Final   Parainfluenza Virus 1 NOT DETECTED NOT DETECTED Final   Parainfluenza Virus 2 NOT DETECTED NOT DETECTED Final   Parainfluenza Virus 3 NOT DETECTED NOT DETECTED Final   Parainfluenza Virus 4 NOT DETECTED NOT DETECTED Final   Respiratory Syncytial Virus NOT DETECTED NOT DETECTED Final   Bordetella pertussis NOT DETECTED NOT DETECTED Final   Bordetella Parapertussis NOT DETECTED NOT DETECTED Final   Chlamydophila pneumoniae NOT DETECTED NOT DETECTED Final   Mycoplasma pneumoniae NOT DETECTED NOT DETECTED Final    Comment: Performed at [facility], [address], [place], [place] [address]  CULTURE, BLOOD (ROUTINE X 2) w Reflex to ID Panel     Status: None (Preliminary result)   Collection Time: 08/15/21  7:47 PM   Specimen: BLOOD  Result Value Ref Range Status   Specimen Description BLOOD BLOOD LEFT HAND  Final   Special Requests   Final    BOTTLES DRAWN AEROBIC ONLY Blood Culture results may not be optimal due to an inadequate volume of blood received in culture bottles   Culture   Final    NO GROWTH 2 DAYS Performed at [facility] [address] [place], [place] [address]    Report Status PENDING  Incomplete  CULTURE, BLOOD (ROUTINE X 2) w Reflex to ID Panel     Status: None (Preliminary result)   Collection Time: 08/15/21  7:47 PM   Specimen: BLOOD  Result  Value Ref Range Status   Specimen Description BLOOD BLOOD LEFT HAND  Final   Special Requests   Final    BOTTLES DRAWN AEROBIC ONLY Blood Culture results may not be optimal due to an inadequate volume of blood received in culture bottles   Culture   Final    NO GROWTH 2 DAYS Performed at [facility], [address], [place], [place] [address]    Report Status PENDING  Incomplete  MRSA Next Gen by PCR, Nasal     Status: None   Collection Time: 08/15/21  9:20 PM   Specimen: Nasal Mucosa; Nasal Swab  Result  Value Ref Range Status   MRSA by PCR Next Gen NOT DETECTED NOT DETECTED Final    Comment: (NOTE) The GeneXpert MRSA Assay (FDA approved for NASAL specimens only), is one component of a comprehensive MRSA colonization surveillance program. It is not intended to diagnose MRSA infection nor to guide or monitor treatment for MRSA infections. Test performance is not FDA approved in patients less than 51 years old. Performed at [facility], [address], [place], [place] [address]          Radiology Studies: DG Chest Portable 1 View  Result Date: 08/15/2021 CLINICAL DATA:  Respiratory distress. EXAM: PORTABLE CHEST 1 VIEW COMPARISON:  08/12/2021 and older exams. FINDINGS: Cardiac silhouette is normal in size. No mediastinal or hilar masses. Lungs demonstrate vascular interstitial prominence. There are no areas of lung consolidation. No convincing pleural effusion and no pneumothorax. Skeletal structures are grossly intact. IMPRESSION: 1. No convincing acute cardiopulmonary disease. Electronically Signed   By: Amie Portland M.D.   On: 08/15/2021 16:06        Scheduled Meds:  arformoterol  15 mcg Nebulization BID   azithromycin  500 mg Oral Daily   budesonide (PULMICORT) nebulizer solution  0.25 mg Nebulization BID   Chlorhexidine Gluconate Cloth  6 each Topical Daily   clopidogrel  75 mg Oral Daily   enoxaparin (LOVENOX) injection  40 mg Subcutaneous QHS   insulin aspart  0-20 Units Subcutaneous Q4H   ipratropium-albuterol  3 mL Nebulization TID   lactulose  20 g Oral BID   lidocaine  1 patch Transdermal Q24H   losartan  50 mg Oral Daily   melatonin  2.5 mg Oral QHS   methylPREDNISolone (SOLU-MEDROL) injection  40 mg Intravenous Q24H   nitroGLYCERIN  1 inch Topical Q8H   pantoprazole  40 mg Oral Daily   Continuous Infusions:  sodium chloride Stopped (08/15/21 1952)     LOS: 2 days    Time spent: 35 minutes    Tresa Moore, MD [facility]   If 7PM-7AM, please contact night-coverage  08/17/2021, 2:23 PM

## 2021-08-17 NOTE — Progress Notes (Signed)
Patient transferred to [facility] via bed..  Family with pt at time of transfer.  All belongings with patient.Bethany Villa

## 2021-08-17 NOTE — Progress Notes (Signed)
CRITICAL CARE PROGRESS NOTE    Name: Bethany Villa MRN: [id number] DOB: 12-Sep-1955     LOS: 2   SUBJECTIVE FINDINGS & SIGNIFICANT EVENTS    Patient description:  65 yo patient with hx of bedbugs, COPD, lifelong smoking, heart failure came in from facility due to worsening doe/sob cough with s/s of AECOPD and flu like illness.    08/17/21- patient is improved and PCCM will sign off and available PRN.  [facility] has assumed care and already evaluated patient.    Lines/tubes :   Microbiology/Sepsis markers: Results for orders placed or performed during the hospital encounter of 08/15/21  Resp Panel by RT-PCR (Flu A&B, Covid) Nasopharyngeal Swab     Status: None   Collection Time: 08/15/21  3:42 PM   Specimen: Nasopharyngeal Swab; Nasopharyngeal(NP) swabs in vial transport medium  Result Value Ref Range Status   SARS Coronavirus 2 by RT PCR NEGATIVE NEGATIVE Final    Comment: (NOTE) SARS-CoV-2 target nucleic acids are NOT DETECTED.  The SARS-CoV-2 RNA is generally detectable in upper respiratory specimens during the acute phase of infection. The lowest concentration of SARS-CoV-2 viral copies this assay can detect is 138 copies/mL. A negative result does not preclude SARS-Cov-2 infection and should not be used as the sole basis for treatment or other patient management decisions. A negative result may occur with  improper specimen collection/handling, submission of specimen other than nasopharyngeal swab, presence of viral mutation(s) within the areas targeted by this assay, and inadequate number of viral copies(<138 copies/mL). A negative result must be combined with clinical observations, patient history, and epidemiological information. The expected result is Negative.  Fact Sheet for Patients:   EntrepreneurPulse.com.au  Fact Sheet for Healthcare Providers:  IncredibleEmployment.be  This test is no t yet approved or cleared by the [place] FDA and  has been authorized for detection and/or diagnosis of SARS-CoV-2 by FDA under an Emergency Use Authorization (EUA). This EUA will remain  in effect (meaning this test can be used) for the duration of the COVID-19 declaration under Section 564(b)(1) of the Act, 21 U.S.C.section 360bbb-3(b)(1), unless the authorization is terminated  or revoked sooner.       Influenza A by PCR NEGATIVE NEGATIVE Final   Influenza B by PCR NEGATIVE NEGATIVE Final    Comment: (NOTE) The Xpert Xpress SARS-CoV-2/FLU/RSV plus assay is intended as an aid in the diagnosis of influenza from Nasopharyngeal swab specimens and should not be used as a sole basis for treatment. Nasal washings and aspirates are unacceptable for Xpert Xpress SARS-CoV-2/FLU/RSV testing.  Fact Sheet for Patients: EntrepreneurPulse.com.au  Fact Sheet for Healthcare Providers: IncredibleEmployment.be  This test is not yet approved or cleared by the [place] FDA and has been authorized for detection and/or diagnosis of SARS-CoV-2 by FDA under an Emergency Use Authorization (EUA). This EUA will remain in effect (meaning this test can be used) for the duration of the COVID-19 declaration under Section 564(b)(1) of the Act, 21 U.S.C. section 360bbb-3(b)(1), unless the authorization is terminated or revoked.  Performed at [facility], [place] [place],  [address]   Blood Culture (routine x 2)     Status: None (Preliminary result)   Collection Time: 08/15/21  3:42 PM   Specimen: BLOOD  Result Value Ref Range Status   Specimen Description BLOOD RIGHT [facility]  Final   Special Requests   Final    BOTTLES DRAWN AEROBIC AND ANAEROBIC Blood Culture adequate volume   Culture   Final  NO GROWTH 2 DAYS Performed at [facility], [address] [place], [place] [address]    Report Status PENDING  Incomplete  Blood Culture (routine x 2)     Status: None (Preliminary result)   Collection Time: 08/15/21  3:42 PM   Specimen: BLOOD  Result Value Ref Range Status   Specimen Description BLOOD RIGHT WRIST  Final   Special Requests   Final    BOTTLES DRAWN AEROBIC AND ANAEROBIC Blood Culture adequate volume   Culture   Final    NO GROWTH 2 DAYS Performed at [facility], [address] [place], [place] [address]    Report Status PENDING  Incomplete  Respiratory (~20 pathogens) panel by PCR     Status: None   Collection Time: 08/15/21  7:40 PM   Specimen: Nasopharyngeal Swab; Respiratory  Result Value Ref Range Status   Adenovirus NOT DETECTED NOT DETECTED Final   Coronavirus 229E NOT DETECTED NOT DETECTED Final    Comment: (NOTE) The Coronavirus on the Respiratory Panel, DOES NOT test for the novel  Coronavirus (2019 nCoV)    Coronavirus HKU1 NOT DETECTED NOT DETECTED Final   Coronavirus NL63 NOT DETECTED NOT DETECTED Final   Coronavirus OC43 NOT DETECTED NOT DETECTED Final   Metapneumovirus NOT DETECTED NOT DETECTED Final   Rhinovirus / Enterovirus NOT DETECTED NOT DETECTED Final   Influenza A NOT DETECTED NOT DETECTED Final   Influenza B NOT DETECTED NOT DETECTED Final   Parainfluenza Virus 1 NOT DETECTED NOT DETECTED Final   Parainfluenza Virus 2 NOT DETECTED NOT DETECTED Final   Parainfluenza Virus 3 NOT DETECTED NOT DETECTED Final   Parainfluenza Virus 4 NOT DETECTED NOT DETECTED Final   Respiratory Syncytial Virus NOT DETECTED NOT DETECTED Final   Bordetella pertussis NOT DETECTED NOT DETECTED Final   Bordetella Parapertussis NOT DETECTED NOT DETECTED Final   Chlamydophila pneumoniae NOT DETECTED NOT DETECTED Final   Mycoplasma pneumoniae NOT DETECTED NOT DETECTED Final    Comment: Performed at [facility], [place]. [address] [place], [place] [address]  CULTURE, BLOOD (ROUTINE X 2) w Reflex to ID Panel     Status: None (Preliminary result)   Collection Time: 08/15/21  7:47 PM   Specimen: BLOOD  Result Value Ref Range Status   Specimen Description BLOOD BLOOD LEFT HAND  Final   Special Requests   Final    BOTTLES DRAWN AEROBIC ONLY Blood Culture results may not be optimal due to an inadequate volume of blood received in culture bottles   Culture   Final    NO GROWTH 2 DAYS Performed at [facility], [address] [place], [place] [address]    Report Status PENDING  Incomplete  CULTURE, BLOOD (ROUTINE X 2) w Reflex to ID Panel     Status: None (Preliminary result)   Collection Time: 08/15/21  7:47 PM   Specimen: BLOOD  Result Value Ref Range Status   Specimen Description BLOOD BLOOD LEFT HAND  Final   Special Requests   Final    BOTTLES DRAWN AEROBIC ONLY Blood Culture results may not be optimal due to an inadequate volume of blood received in culture bottles   Culture   Final    NO GROWTH 2 DAYS Performed at [facility], [address], [place], [place] [address]    Report Status PENDING  Incomplete  MRSA Next Gen by PCR, Nasal     Status: None   Collection Time: 08/15/21  9:20  PM   Specimen: Nasal Mucosa; Nasal Swab  Result Value Ref Range Status   MRSA by PCR Next Gen NOT DETECTED NOT DETECTED Final    Comment: (NOTE) The GeneXpert MRSA Assay (FDA approved for NASAL specimens only), is one component of a comprehensive MRSA colonization surveillance program. It is not intended to diagnose MRSA infection nor to guide or monitor treatment for MRSA infections. Test performance is not FDA approved in patients less than 80 years old. Performed at [facility], [address], [place], [place] [address]     Anti-infectives:  Anti-infectives (From admission, onward)    Start     Dose/Rate Route Frequency Ordered Stop   08/16/21 1630  azithromycin (ZITHROMAX) 500 mg in  sodium chloride 0.9 % 250 mL IVPB  Status:  Discontinued        500 mg 250 mL/hr over 60 Minutes Intravenous Every 24 hours 08/16/21 0237 08/16/21 1350   08/16/21 1445  azithromycin (ZITHROMAX) tablet 500 mg        500 mg Oral Daily 08/16/21 1351     08/15/21 1545  vancomycin (VANCOCIN) IVPB 1000 mg/200 mL premix  Status:  Discontinued        1,000 mg 200 mL/hr over 60 Minutes Intravenous  Once 08/15/21 1542 08/15/21 1548   08/15/21 1545  ceFEPIme (MAXIPIME) 2 g in sodium chloride 0.9 % 100 mL IVPB        2 g 200 mL/hr over 30 Minutes Intravenous  Once 08/15/21 1542 08/15/21 1648   08/15/21 1545  azithromycin (ZITHROMAX) 500 mg in sodium chloride 0.9 % 250 mL IVPB        500 mg 250 mL/hr over 60 Minutes Intravenous  Once 08/15/21 1542 08/15/21 1732        PAST MEDICAL HISTORY   Past Medical History:  Diagnosis Date   Anxiety    Asthma    Chronic pain    lower back   Chronic systolic CHF (congestive heart failure) ([place]) 09/22/2019   COPD (chronic obstructive pulmonary disease) (HCC)    Depression    DJD (degenerative joint disease)    Fibromyalgia    GERD (gastroesophageal reflux disease)    Headache    Hyperlipidemia    Lower extremity edema    On home oxygen therapy    has not been on since 2016   Panic attack    Shortness of breath dyspnea    Sleep disorder      SURGICAL HISTORY   Past Surgical History:  Procedure Laterality Date   ABDOMINAL SURGERY     ANKLE SURGERY     BACK SURGERY     lower   CARPAL TUNNEL RELEASE     CESAREAN SECTION     I & D KNEE WITH POLY EXCHANGE N/A 06/11/2020   Procedure: IRRIGATION AND DEBRIDEMENT KNEE WITH POLY EXCHANGE;  Surgeon: Corky Mull, MD;  Location: [facility] ORS;  Service: Orthopedics;  Laterality: N/A;   KNEE SURGERY     TONSILLECTOMY     TOTAL KNEE ARTHROPLASTY Left 04/14/2016   Procedure: TOTAL KNEE ARTHROPLASTY;  Surgeon: Earnestine Leys, MD;  Location: [facility] ORS;  Service: Orthopedics;  Laterality: Left;   TUBAL  LIGATION       FAMILY HISTORY   Family History  Problem Relation Age of Onset   Cancer Mother        beast     SOCIAL HISTORY   Social History   Tobacco Use   Smoking status: Every Day  Packs/day: 1.00    Years: 45.00    Pack years: 45.00    Types: Cigarettes   Smokeless tobacco: Never  Vaping Use   Vaping Use: Never used  Substance Use Topics   Alcohol use: Yes    Alcohol/week: 0.0 standard drinks    Comment: rare   Drug use: No     MEDICATIONS   Current Medication:  Current Facility-Administered Medications:    0.9 %  sodium chloride infusion, 250 mL, Intravenous, Continuous, Ouma, Hubbard Hartshorn, NP, Held at 08/15/21 1952   albuterol (PROVENTIL) (2.5 MG/3ML) 0.083% nebulizer solution 2.5 mg, 2.5 mg, Nebulization, Q2H PRN, Georgeann Oppenheim, Sudheer B, MD   arformoterol (BROVANA) nebulizer solution 15 mcg, 15 mcg, Nebulization, BID, Sreenath, Sudheer B, MD   azithromycin (ZITHROMAX) tablet 500 mg, 500 mg, Oral, Daily, Tressie Ellis, RPH, 500 mg at 08/17/21 1037   budesonide (PULMICORT) nebulizer solution 0.25 mg, 0.25 mg, Nebulization, BID, Ouma, Hubbard Hartshorn, NP, 0.25 mg at 08/17/21 0900   Chlorhexidine Gluconate Cloth 2 % PADS 6 each, 6 each, Topical, Daily, Ouma, Hubbard Hartshorn, NP, 6 each at 08/17/21 1537   clopidogrel (PLAVIX) tablet 75 mg, 75 mg, Oral, Daily, Sreenath, Sudheer B, MD, 75 mg at 08/17/21 1034   docusate sodium (COLACE) capsule 100 mg, 100 mg, Oral, BID PRN, Vida Rigger, MD   enoxaparin (LOVENOX) injection 40 mg, 40 mg, Subcutaneous, QHS, Zaine Elsass, MD, 40 mg at 08/16/21 2132   guaiFENesin-dextromethorphan (ROBITUSSIN DM) 100-10 MG/5ML syrup 5 mL, 5 mL, Oral, Q4H PRN, Sreenath, Sudheer B, MD   insulin aspart (novoLOG) injection 0-20 Units, 0-20 Units, Subcutaneous, Q4H, Ouma, Hubbard Hartshorn, NP, 4 Units at 08/17/21 1537   ipratropium-albuterol (DUONEB) 0.5-2.5 (3) MG/3ML nebulizer solution 3 mL, 3 mL, Nebulization, TID,  Sreenath, Sudheer B, MD, 3 mL at 08/17/21 1425   lactulose (CHRONULAC) 10 GM/15ML solution 20 g, 20 g, Oral, BID, Sreenath, Sudheer B, MD, 20 g at 08/17/21 1034   lidocaine (LIDODERM) 5 % 1 patch, 1 patch, Transdermal, Q24H, Sreenath, Sudheer B, MD, 1 patch at 08/17/21 0913   losartan (COZAAR) tablet 50 mg, 50 mg, Oral, Daily, Sreenath, Sudheer B, MD, 50 mg at 08/17/21 1034   melatonin tablet 2.5 mg, 2.5 mg, Oral, QHS, Amanda Cockayne, NP, 2.5 mg at 08/16/21 2247   methylPREDNISolone sodium succinate (SOLU-MEDROL) 40 mg/mL injection 40 mg, 40 mg, Intravenous, Q24H, Amalee Olsen, MD, 40 mg at 08/17/21 1033   morphine 2 MG/ML injection 2 mg, 2 mg, Intravenous, Q3H PRN, Georgeann Oppenheim, Sudheer B, MD, 2 mg at 08/17/21 1533   nitroGLYCERIN (NITROGLYN) 2 % ointment 1 inch, 1 inch, Topical, Q8H, Ouma, Hubbard Hartshorn, NP, 1 inch at 08/17/21 1357   oxyCODONE (Oxy IR/ROXICODONE) immediate release tablet 5 mg, 5 mg, Oral, Q4H PRN, Georgeann Oppenheim, Sudheer B, MD, 5 mg at 08/17/21 1357   pantoprazole (PROTONIX) EC tablet 40 mg, 40 mg, Oral, Daily, Sreenath, Sudheer B, MD, 40 mg at 08/17/21 1034   polyethylene glycol (MIRALAX / GLYCOLAX) packet 17 g, 17 g, Oral, Daily PRN, Vida Rigger, MD    ALLERGIES   Gabapentin, Mobic [meloxicam], Penicillins, and Sulfa antibiotics    REVIEW OF SYSTEMS     10 point ros done and is negative except generalized pain   PHYSICAL EXAMINATION   Vital Signs: Temp:  [98.2 F (36.8 C)-99.1 F (37.3 C)] 99.1 F (37.3 C) (12/19 0800) Pulse Rate:  [64-96] 95 (12/19 1633) Resp:  [10-22] 14 (12/19 1633) BP: (108-191)/(62-98) 163/82 (12/19 1400) SpO2:  [90 %-  100 %] 98 % (12/19 1633) FiO2 (%):  [36 %] 36 % (12/18 1959) Weight:  [83.6 kg] 83.6 kg (12/19 0500)  GENERAL:Age appropriate mild distress HEAD: Normocephalic, atraumatic.  EYES: Pupils equal, round, reactive to light.  No scleral icterus.  MOUTH: Moist mucosal membrane. NECK: Supple. No thyromegaly. No nodules.  No JVD.  PULMONARY: bilateral  rhocnhi improved from yesterday  CARDIOVASCULAR: S1 and S2. Regular rate and rhythm. No murmurs, rubs, or gallops.  GASTROINTESTINAL: Soft, nontender, non-distended. No masses. Positive bowel sounds. No hepatosplenomegaly.  MUSCULOSKELETAL: No swelling, clubbing, or edema.  NEUROLOGIC: Mild distress due to acute illness SKIN:intact,warm,dry   PERTINENT DATA     Infusions:  sodium chloride Stopped (08/15/21 1952)   Scheduled Medications:  arformoterol  15 mcg Nebulization BID   azithromycin  500 mg Oral Daily   budesonide (PULMICORT) nebulizer solution  0.25 mg Nebulization BID   Chlorhexidine Gluconate Cloth  6 each Topical Daily   clopidogrel  75 mg Oral Daily   enoxaparin (LOVENOX) injection  40 mg Subcutaneous QHS   insulin aspart  0-20 Units Subcutaneous Q4H   ipratropium-albuterol  3 mL Nebulization TID   lactulose  20 g Oral BID   lidocaine  1 patch Transdermal Q24H   losartan  50 mg Oral Daily   melatonin  2.5 mg Oral QHS   methylPREDNISolone (SOLU-MEDROL) injection  40 mg Intravenous Q24H   nitroGLYCERIN  1 inch Topical Q8H   pantoprazole  40 mg Oral Daily   PRN Medications: albuterol, docusate sodium, guaiFENesin-dextromethorphan, morphine injection, oxyCODONE, polyethylene glycol Hemodynamic parameters:   Intake/Output: 12/18 0701 - 12/19 0700 In: 1200 [P.O.:1200] Out: 1000 [Urine:1000]  Ventilator  Settings: FiO2 (%):  [36 %] 36 %  LAB RESULTS:  Basic Metabolic Panel: Recent Labs  Lab 08/10/21 1821 08/10/21 2235 08/11/21 0137 08/11/21 0516 08/15/21 1542 08/15/21 1947 08/16/21 0411  NA 137  --  137 137 136  --  134*  K 5.9*  --  5.3* 4.9 4.3  --  4.7  CL 93*  --  96* 98 99  --  102  CO2 37*  --  35* 32 32  --  28  GLUCOSE 191*  --  63* 149* 114*  --  138*  BUN 10  --  13 14 30*  --  26*  CREATININE 1.20* 1.12* 1.01* 0.99 1.27*  --  0.76  CALCIUM 9.0  --  8.2* 8.2* 7.9*  --  8.1*  MG  --   --   --  2.3  --  2.1   --   PHOS  --   --   --  4.8*  --   --   --     Liver Function Tests: Recent Labs  Lab 08/10/21 1821 08/11/21 0516 08/15/21 1542  AST 72* 149* 51*  ALT 32 33 35  ALKPHOS 96 66 57  BILITOT 0.5 1.0 0.8  PROT 8.1 7.1 6.6  ALBUMIN 3.7 3.2* 3.2*    No results for input(s): LIPASE, AMYLASE in the last 168 hours. Recent Labs  Lab 08/10/21 1821 08/11/21 0516 08/15/21 1710  AMMONIA 136* 20 33    CBC: Recent Labs  Lab 08/10/21 1821 08/10/21 2235 08/11/21 1444 08/15/21 1542 08/16/21 0411  WBC 12.3* 14.6* 4.7 20.2* 12.5*  NEUTROABS 9.3*  --   --  13.9* 11.4*  HGB 14.2 13.1 11.9* 12.7 11.4*  HCT 48.1* 43.8 38.2 42.4 37.9  MCV 98.8 98.0 93.4 99.1 95.7  PLT 252 166 120*  170 141*    Cardiac Enzymes: No results for input(s): CKTOTAL, CKMB, CKMBINDEX, TROPONINI in the last 168 hours. BNP: Invalid input(s): POCBNP CBG: Recent Labs  Lab 08/16/21 2340 08/17/21 0324 08/17/21 0809 08/17/21 1128 08/17/21 1535  GLUCAP 130* 89 104* 163* 181*        IMAGING RESULTS:  Imaging: No results found. @PROBHOSP @ No results found.      ASSESSMENT AND PLAN    -Multidisciplinary rounds held today  Acute Hypoxic Hypercapnic Respiratory Failure   -due to  severe COPD exacerbation   - RVP- negative   - steroids - solumedrol    - Zithromax 500 IV daily    - duoneb  -continue Full NIV support- BIPAP 14/8 - 40% FiO2 -continue Bronchodilator Therapy -off vasopressors  Circulatory shock-RESOLVED   - possible cardiac vs septic   - blood cultures  - lactate  - crp  -on levophed currently   - CXR with edema caution with fluids   Acute on chronic diastilic CHF iBNP 0000000 CXR with pulm edema -oxygen as needed -Lasix as tolerated -follow up cardiac biomarkers ICU monitoring  Renal Failure-most likely due to ATN-RESOLVED -follow chem 7 -follow UO -continue Foley Catheter-assess need daily Dc nephrotoxins  Bed bugs  - hygiene precautions    ID -continue IV  abx as prescibed -follow up cultures  GI/Nutrition GI PROPHYLAXIS as indicated DIET-->TF's as tolerated Constipation protocol as indicated  ENDO - ICU hypoglycemic\Hyperglycemia protocol -check FSBS per protocol   ELECTROLYTES -follow labs as needed -replace as needed -pharmacy consultation   DVT/GI PRX ordered -SCDs  TRANSFUSIONS AS NEEDED MONITOR FSBS ASSESS the need for LABS as needed   Critical care provider statement:   Total critical care time: 33 minutes   Performed by: Lanney Gins MD   Critical care time was exclusive of separately billable procedures and treating other patients.   Critical care was necessary to treat or prevent imminent or life-threatening deterioration.   Critical care was time spent personally by me on the following activities: development of treatment plan with patient and/or surrogate as well as nursing, discussions with consultants, evaluation of patient's response to treatment, examination of patient, obtaining history from patient or surrogate, ordering and performing treatments and interventions, ordering and review of laboratory studies, ordering and review of radiographic studies, pulse oximetry and re-evaluation of patient's condition.    Ottie Glazier, M.D.  Pulmonary & Critical Care Medicine       Ottie Glazier, M.D.  Division of [place]

## 2021-08-17 NOTE — Progress Notes (Signed)
Report called to nurse on 2C.  Pt to be transferred to [facility].  All questions addressed.

## 2021-08-18 MED ORDER — FUROSEMIDE 10 MG/ML IJ SOLN
40.0000 mg | Freq: Once | INTRAMUSCULAR | Status: AC
  Administered 2021-08-18: 15:00:00 40 mg via INTRAVENOUS
  Filled 2021-08-18: qty 4

## 2021-08-18 MED ORDER — METHOCARBAMOL 500 MG PO TABS
750.0000 mg | ORAL_TABLET | Freq: Three times a day (TID) | ORAL | Status: DC
  Administered 2021-08-18 – 2021-08-25 (×22): 750 mg via ORAL
  Filled 2021-08-18 (×22): qty 2

## 2021-08-18 NOTE — TOC Initial Note (Signed)
Transition of Care [facility]) - Initial/Assessment Note    Patient Details  Name: Bethany MUMPOWER MRN: [id number] Date of Birth: Jun 21, 1956  Transition of [facility]) CM/SW Contact:    Chapman Fitch, RN Phone Number: 08/18/2021, 3:30 PM  Clinical Narrative:                 Admitted UDJ:SHFW Admitted from home PCP: [place], family and friends transport to appointments Pharmacy: [facility]  Current home health/prior home health/DME: [facility] through [facility]. Was set up with [facility] previous admission, however they are unable to accept her back  Therapy recommending SNF Patient agreeable for bed search Existing PASRR Fl2 sent for signature Bed search initiated    Expected Discharge Plan: Skilled Nursing Facility Barriers to Discharge: Continued Medical Work up   Patient Goals and CMS Choice        Expected Discharge Plan and Services Expected Discharge Plan: Skilled Nursing Facility       Living arrangements for the past 2 months: Single Family Home                                      Prior Living Arrangements/Services Living arrangements for the past 2 months: Single Family Home Lives with:: Self Patient language and need for interpreter reviewed:: Yes Do you feel safe going back to the place where you live?: Yes      Need for Family Participation in Patient Care: Yes (Comment) Care giver support system in place?: Yes (comment)   Criminal Activity/Legal Involvement Pertinent to Current Situation/Hospitalization: No - Comment as needed  Activities of Daily Living      Permission Sought/Granted                  Emotional Assessment       Orientation: : Oriented to Self, Oriented to Place, Oriented to  Time Alcohol / Substance Use: Not Applicable Psych Involvement: No (comment)  Admission diagnosis:  Acute exacerbation of COPD with asthma (HCC) [J44.1, J45.901] Patient Active Problem List   Diagnosis Date Noted   Acute  exacerbation of COPD with asthma (HCC) 08/15/2021   RUQ pain    Episode of unresponsiveness 08/10/2021   Hyperammonemia (HCC) 08/10/2021   Chronic, continuous use of opioids 08/10/2021   AKI (acute kidney injury) (HCC) 08/10/2021   Hyperkalemia 08/10/2021   Acute hypercapnic respiratory failure (HCC) 10/07/2019   Altered mental status    Chronic systolic CHF (congestive heart failure) (HCC) 09/22/2019   Degeneration of cervical intervertebral disc 07/05/2019   Bilateral primary osteoarthritis of knee 04/26/2019   Chronic bilateral thoracic back pain 12/05/2018   Cervicalgia 12/05/2018   Chronic knee pain after total replacement of left knee joint 12/05/2018   Acute adjustment disorder with anxiety 10/06/2017   Palliative care by specialist    Respiratory failure (HCC) 10/01/2017   COPD exacerbation (HCC) 09/01/2016   Elevated brain natriuretic peptide (BNP) level 09/01/2016   LFT elevation 09/01/2016   Total knee replacement status 04/14/2016   Chronic obstructive pulmonary disease with acute lower respiratory infection (HCC)    Acute respiratory failure with hypoxia and hypercapnia (HCC)    Sepsis (HCC) 09/03/2015   CAP (community acquired pneumonia) 09/01/2015   SIRS (systemic inflammatory response syndrome) (HCC) 07/24/2014   COPD with acute exacerbation (HCC) 09/29/2013   Acute on chronic respiratory failure with hypercapnia (HCC) 11/27/2012   Insomnia 11/04/2012   Generalized  anxiety disorder 11/04/2012   Depression 11/04/2012   Fibromyalgia 11/04/2012   Acute metabolic encephalopathy 10/03/2011   COPD (chronic obstructive pulmonary disease) (HCC) 06/01/2011   Smoking 05/19/2011   Obesity 05/19/2011   Hyperglycemia 05/19/2011   PCP:  [name], MD Pharmacy:   [facility] - [place], [place] - [address] [place] [address] Phone: [phone] Fax: [phone]  [facility] [address] [place] - [address]  [place] [address] Phone: [phone] Fax: [phone]     Social Determinants of Health (SDOH) Interventions    Readmission Risk Interventions Readmission Risk Prevention Plan 08/18/2021  Transportation Screening Complete  HRI or Home Care Consult Complete  Social Work Consult for Recovery Care Planning/Counseling Complete  Palliative Care Screening Not Applicable  Medication Review Oceanographer) Complete  Some recent data might be hidden

## 2021-08-18 NOTE — Evaluation (Signed)
Physical Therapy Evaluation Patient Details Name: Bethany Villa MRN: [id number] DOB: 1956/02/13 Today's Date: 08/18/2021  History of Present Illness  Pt is a 65 y.o. female presenting to hospital 12/17 with AMS and respiratory distress; also hypotensive and pain all over.  Recent hospitalization for PNA.  Pt admitted with acute exacerbation of COPD, acute hypoxic and hypercarbic respiratory failure, circulatory shock, acute on chronic diastolic CHF, AKI, and bed bugs.  PMH includes COPD, CHF, anxiety, chronic LBP, fibromyalgia, panic attack, sleep disorder, ankle sx, low back sx, CTR, knee sx L, h/o bedbugs.  Clinical Impression  Prior to hospital admission, pt reports being independent with ambulation; lives alone in 1 level home with level entry (pt's son lives close by and checks in on pt).  Pt reporting R rib fx's (although pt unable to verbalize how long she has had them) and initially refusing therapy d/t pain.  Nurse notified and gave pt IV morphine to improve pain.  PT/OT co-evaluation then performed d/t pain concerns requiring anticipated increased assist with functional mobility.  Pt reporting improved comfort s/p IV morphine but still requiring encouragement to participate in therapy.  Pt able to perform 3/4th of logroll to L side (using pillow to brace against R rib fx's) but then pt refused any further mobility d/t R rib pain (8/10).  O2 sats noted to be 86% post mobility (on 1 L O2 via nasal cannula at rest) so therapist increased O2 to 2 L and O2 sats 88% at rest (nurse notified).  Pt oriented to person, place, month/year but not situation (why in hospital).  Pt would benefit from skilled PT to address noted impairments and functional limitations (see below for any additional details).  Upon hospital discharge, pt would benefit from SNF to improve overall strength and independence with functional mobility.    Recommendations for follow up therapy are one component of a multi-disciplinary  discharge planning process, led by the attending physician.  Recommendations may be updated based on patient status, additional functional criteria and insurance authorization.  Follow Up Recommendations Skilled nursing-short term rehab (<3 hours/day)    Assistance Recommended at Discharge Frequent or constant Supervision/Assistance  Functional Status Assessment Patient has had a recent decline in their functional status and demonstrates the ability to make significant improvements in function in a reasonable and predictable amount of time.  Equipment Recommendations  Rolling walker (2 wheels);BSC/3in1    Recommendations for Other Services OT consult     Precautions / Restrictions Precautions Precautions: Fall Restrictions Weight Bearing Restrictions: No      Mobility  Bed Mobility Overal bed mobility: Needs Assistance Bed Mobility: Rolling Rolling: Mod assist;+2 for safety/equipment         General bed mobility comments: 3/4th logroll towards L side (limited d/t rib pain); vc's for technique    Transfers                   General transfer comment: unable to assess--pt declined further mobility d/t rib pain    Ambulation/Gait               General Gait Details: unable to assess--pt declined further mobility d/t rib pain  Stairs            Wheelchair Mobility    Modified Rankin (Stroke Patients Only)       Balance  Pertinent Vitals/Pain Pain Assessment: 0-10 Pain Score: 8  Pain Location: R ribs Pain Descriptors / Indicators: Aching;Sore;Tender Pain Intervention(s): Limited activity within patient's tolerance;Monitored during session;Repositioned;Patient requesting pain meds-RN notified (RN gave pt pain meds prior to PT/OT co-eval.) HR WFL during sessions activities.    Home Living Family/patient expects to be discharged to:: Private residence Living Arrangements:  Alone Available Help at Discharge: Family;Available PRN/intermittently (Pt's son lives close by) Type of Home: Apartment Home Access: Level entry       Home Layout: One level Home Equipment: Grab bars - tub/shower;Shower seat;Grab bars - toilet;Rollator (4 wheels)      Prior Function Prior Level of Function : Independent/Modified Independent             Mobility Comments: Independent with ambulation; denies any recent falls; 2 L O2 at night only       Hand Dominance        Extremity/Trunk Assessment   Upper Extremity Assessment Upper Extremity Assessment: Defer to OT evaluation    Lower Extremity Assessment Lower Extremity Assessment: Generalized weakness (at least 3/5 hip flexion, knee flexion/extension, and DF/PF in bed)    Cervical / Trunk Assessment Cervical / Trunk Assessment: Normal  Communication   Communication: No difficulties  Cognition Arousal/Alertness: Awake/alert Behavior During Therapy: Anxious Overall Cognitive Status: Impaired/Different from baseline Area of Impairment: Orientation                 Orientation Level: Situation             General Comments: Oriented to person, place, month/year (and close to Christmas).  Pt did not know exactly why she was at hospital (pt guessing that maybe she fell)        General Comments  Nursing cleared pt for participation in physical therapy.     Exercises     Assessment/Plan    PT Assessment Patient needs continued PT services  PT Problem List Decreased strength;Decreased activity tolerance;Decreased balance;Decreased mobility;Decreased knowledge of use of DME;Pain       PT Treatment Interventions DME instruction;Gait training;Functional mobility training;Therapeutic activities;Therapeutic exercise;Balance training;Patient/family education    PT Goals (Current goals can be found in the Care Plan section)  Acute Rehab PT Goals Patient Stated Goal: to improve pain PT Goal  Formulation: With patient Time For Goal Achievement: 09/01/21 Potential to Achieve Goals: Fair    Frequency Min 2X/week   Barriers to discharge Decreased caregiver support      Co-evaluation PT/OT/SLP Co-Evaluation/Treatment: Yes Reason for Co-Treatment: To address functional/ADL transfers;Other (comment) (Pain) PT goals addressed during session: Mobility/safety with mobility OT goals addressed during session: ADL's and self-care       AM-PAC PT "6 Clicks" Mobility  Outcome Measure Help needed turning from your back to your side while in a flat bed without using bedrails?: None Help needed moving from lying on your back to sitting on the side of a flat bed without using bedrails?: None Help needed moving to and from a bed to a chair (including a wheelchair)?: A Lot Help needed standing up from a chair using your arms (e.g., wheelchair or bedside chair)?: A Lot Help needed to walk in hospital room?: A Lot Help needed climbing 3-5 steps with a railing? : Total 6 Click Score: 15    End of Session Equipment Utilized During Treatment: Oxygen Activity Tolerance: Patient limited by pain Patient left: in bed;with call bell/phone within reach;with bed alarm set Nurse Communication: Mobility status;Precautions (pt's O2 sats and O2 increased to  2 L) PT Visit Diagnosis: Other abnormalities of gait and mobility (R26.89);Muscle weakness (generalized) (M62.81);Pain Pain - Right/Left:  (ribs)    Time: 6681-5947 PT Time Calculation (min) (ACUTE ONLY): 17 min   Charges:   PT Evaluation $PT Eval Low Complexity: 1 Low         Natthew Marlatt, PT 08/18/21, 12:34 PM

## 2021-08-18 NOTE — Care Management Important Message (Signed)
Important Message  Patient Details  Name: Bethany Villa MRN: [id number] Date of Birth: Aug 14, 1956   Medicare Important Message Given:  Yes     [name] 08/18/2021, 11:12 AM

## 2021-08-18 NOTE — NC FL2 (Signed)
[place] MEDICAID FL2 LEVEL OF CARE SCREENING TOOL     IDENTIFICATION  Patient Name: Bethany Villa Birthdate: 10-Jul-1956 Sex: female Admission Date (Current Location): 08/15/2021  [facility] and [place] Number:  Chiropodist and Address:         Provider Number: [id number]  Attending Physician Name and Address:  Tresa Moore, MD  Relative Name and Phone Number:       Current Level of Care: Hospital Recommended Level of Care: Skilled Nursing Facility Prior Approval Number:    Date Approved/Denied:   PASRR Number: [id number]  Discharge Plan: SNF    Current Diagnoses: Patient Active Problem List   Diagnosis Date Noted   Acute exacerbation of COPD with asthma (HCC) 08/15/2021   RUQ pain    Episode of unresponsiveness 08/10/2021   Hyperammonemia (HCC) 08/10/2021   Chronic, continuous use of opioids 08/10/2021   AKI (acute kidney injury) (HCC) 08/10/2021   Hyperkalemia 08/10/2021   Acute hypercapnic respiratory failure (HCC) 10/07/2019   Altered mental status    Chronic systolic CHF (congestive heart failure) (HCC) 09/22/2019   Degeneration of cervical intervertebral disc 07/05/2019   Bilateral primary osteoarthritis of knee 04/26/2019   Chronic bilateral thoracic back pain 12/05/2018   Cervicalgia 12/05/2018   Chronic knee pain after total replacement of left knee joint 12/05/2018   Acute adjustment disorder with anxiety 10/06/2017   Palliative care by specialist    Respiratory failure (HCC) 10/01/2017   COPD exacerbation (HCC) 09/01/2016   Elevated brain natriuretic peptide (BNP) level 09/01/2016   LFT elevation 09/01/2016   Total knee replacement status 04/14/2016   Chronic obstructive pulmonary disease with acute lower respiratory infection (HCC)    Acute respiratory failure with hypoxia and hypercapnia (HCC)    Sepsis (HCC) 09/03/2015   CAP (community acquired pneumonia) 09/01/2015   SIRS (systemic inflammatory response syndrome) (HCC)  07/24/2014   COPD with acute exacerbation (HCC) 09/29/2013   Acute on chronic respiratory failure with hypercapnia (HCC) 11/27/2012   Insomnia 11/04/2012   Generalized anxiety disorder 11/04/2012   Depression 11/04/2012   Fibromyalgia 11/04/2012   Acute metabolic encephalopathy 10/03/2011   COPD (chronic obstructive pulmonary disease) (HCC) 06/01/2011   Smoking 05/19/2011   Obesity 05/19/2011   Hyperglycemia 05/19/2011    Orientation RESPIRATION BLADDER Height & Weight     Self, Time  O2 (3L) Incontinent, External catheter Weight: 83.6 kg Height:  5\' 4"  (162.6 cm)  BEHAVIORAL SYMPTOMS/MOOD NEUROLOGICAL BOWEL NUTRITION STATUS      Continent Diet (Heart Healthy)  AMBULATORY STATUS COMMUNICATION OF NEEDS Skin   Extensive Assist Verbally Normal                       Personal Care Assistance Level of Assistance              Functional Limitations Info             SPECIAL CARE FACTORS FREQUENCY  PT (By licensed PT), OT (By licensed OT)                    Contractures Contractures Info: Not present    Additional Factors Info  Code Status, Allergies Code Status Info: Full Allergies Info: Gabapentin, Mobic (Meloxicam), Penicillins, Sulfa Antibiotics           Current Medications (08/18/2021):  This is the current hospital active medication list Current Facility-Administered Medications  Medication Dose Route Frequency Provider Last Rate Last Admin   0.9 %  sodium chloride infusion  250 mL Intravenous Continuous Jimmye Norman, NP   Held at 08/15/21 1952   albuterol (PROVENTIL) (2.5 MG/3ML) 0.083% nebulizer solution 2.5 mg  2.5 mg Nebulization Q2H PRN Lolita Patella B, MD       arformoterol (BROVANA) nebulizer solution 15 mcg  15 mcg Nebulization BID Lolita Patella B, MD   15 mcg at 08/18/21 2956   azithromycin (ZITHROMAX) tablet 500 mg  500 mg Oral Daily Lolita Patella B, MD   500 mg at 08/18/21 0949   budesonide (PULMICORT) nebulizer  solution 0.25 mg  0.25 mg Nebulization BID Jimmye Norman, NP   0.25 mg at 08/18/21 2130   Chlorhexidine Gluconate Cloth 2 % PADS 6 each  6 each Topical Daily Jimmye Norman, NP   6 each at 08/18/21 8657   clopidogrel (PLAVIX) tablet 75 mg  75 mg Oral Daily Lolita Patella B, MD   75 mg at 08/18/21 0950   docusate sodium (COLACE) capsule 100 mg  100 mg Oral BID PRN Vida Rigger, MD       enoxaparin (LOVENOX) injection 40 mg  40 mg Subcutaneous QHS Aleskerov, Fuad, MD   40 mg at 08/17/21 2238   guaiFENesin-dextromethorphan (ROBITUSSIN DM) 100-10 MG/5ML syrup 5 mL  5 mL Oral Q4H PRN Lolita Patella B, MD   5 mL at 08/18/21 0949   ipratropium-albuterol (DUONEB) 0.5-2.5 (3) MG/3ML nebulizer solution 3 mL  3 mL Nebulization TID Lolita Patella B, MD   3 mL at 08/18/21 1413   lactulose (CHRONULAC) 10 GM/15ML solution 20 g  20 g Oral BID Sreenath, Sudheer B, MD   20 g at 08/17/21 1034   lidocaine (LIDODERM) 5 % 1 patch  1 patch Transdermal Q24H Lolita Patella B, MD   1 patch at 08/18/21 0831   losartan (COZAAR) tablet 50 mg  50 mg Oral Daily Lolita Patella B, MD   50 mg at 08/18/21 8469   melatonin tablet 2.5 mg  2.5 mg Oral QHS Amanda Cockayne, NP   2.5 mg at 08/17/21 2236   methocarbamol (ROBAXIN) tablet 750 mg  750 mg Oral TID Lolita Patella B, MD   750 mg at 08/18/21 1512   methylPREDNISolone sodium succinate (SOLU-MEDROL) 40 mg/mL injection 40 mg  40 mg Intravenous Q24H Vida Rigger, MD   40 mg at 08/18/21 0949   morphine 2 MG/ML injection 2 mg  2 mg Intravenous Q3H PRN Lolita Patella B, MD   2 mg at 08/18/21 1132   oxyCODONE (Oxy IR/ROXICODONE) immediate release tablet 5 mg  5 mg Oral Q4H PRN Lolita Patella B, MD   5 mg at 08/18/21 1511   pantoprazole (PROTONIX) EC tablet 40 mg  40 mg Oral Daily Lolita Patella B, MD   40 mg at 08/18/21 0949   polyethylene glycol (MIRALAX / GLYCOLAX) packet 17 g  17 g Oral Daily PRN Vida Rigger, MD          Discharge Medications: Please see discharge summary for a list of discharge medications.  Relevant Imaging Results:  Relevant Lab Results:   Additional Information SS# [id number]  Chapman Fitch, RN

## 2021-08-18 NOTE — Evaluation (Signed)
Occupational Therapy Evaluation Patient Details Name: Bethany Villa MRN: [id number] DOB: 05/10/1956 Today's Date: 08/18/2021   History of Present Illness Pt is a 65 y.o. female presenting to hospital 12/17 with AMS and respiratory distress; also hypotensive and pain all over.  Recent hospitalization for PNA.  Pt admitted with acute exacerbation of COPD, acute hypoxic and hypercarbic respiratory failure, circulatory shock, acute on chronic diastolic CHF, AKI, and bed bugs.  PMH includes COPD, CHF, anxiety, chronic LBP, fibromyalgia, panic attack, sleep disorder, ankle sx, low back sx, CTR, knee sx L, h/o bedbugs.   Clinical Impression   Bethany Villa was seen for OT evaluation this date. Prior to hospital admission, pt was Independent for mobility and ADLs. Pt lives alone in home c level entry. Pt presents to acute OT demonstrating impaired ADL performance and functional mobility 2/2 decreased activity tolerance, pain, and functional strength deficits. Pt is A&Ox3 - unable to state situation and pt states she has not been home since her rib fx despite chart review showing pt d/c home 2 days prior to returning.  Pt currently requires MAX A don B socks at bed level. SETUP grooming at bed level. MOD A for L rolling with +2 for safety, pt demonstrates increased pain and poor tolerance in sidelying. SpO2 84% on 1L [place], improved to 88% on 2L [place]. Pt would benefit from skilled OT to address noted impairments and functional limitations (see below for any additional details) in order to maximize safety and independence while minimizing falls risk and caregiver burden. Upon hospital discharge, recommend STR to maximize pt safety and return to PLOF (pt may progress with improved pain control and participation with therapy).        Recommendations for follow up therapy are one component of a multi-disciplinary discharge planning process, led by the attending physician.  Recommendations may be updated based on patient  status, additional functional criteria and insurance authorization.   Follow Up Recommendations  Skilled nursing-short term rehab (<3 hours/day)    Assistance Recommended at Discharge Intermittent Supervision/Assistance  Functional Status Assessment  Patient has had a recent decline in their functional status and demonstrates the ability to make significant improvements in function in a reasonable and predictable amount of time.  Equipment Recommendations  Other (comment) (defer to next venue of care)    Recommendations for Other Services       Precautions / Restrictions Precautions Precautions: Fall Restrictions Weight Bearing Restrictions: No      Mobility Bed Mobility Overal bed mobility: Needs Assistance Bed Mobility: Rolling Rolling: Mod assist;+2 for safety/equipment         General bed mobility comments: 3/4th logroll towards L side (limited d/t rib pain); vc's for technique    Transfers                   General transfer comment: unable to assess--pt declined further mobility d/t rib pain          ADL either performed or assessed with clinical judgement   ADL Overall ADL's : Needs assistance/impaired                                       General ADL Comments: MAX A don B socks at bed level. SETUP grooming at bed level. Currently anticipate MOD A for toileting at bed level.      Pertinent Vitals/Pain Pain Assessment: 0-10 Pain Score: 8  Pain Location: R ribs Pain Descriptors / Indicators: Aching;Sore;Tender Pain Intervention(s): Limited activity within patient's tolerance;Premedicated before session;Repositioned     Hand Dominance     Extremity/Trunk Assessment Upper Extremity Assessment Upper Extremity Assessment: Generalized weakness   Lower Extremity Assessment Lower Extremity Assessment: Generalized weakness   Cervical / Trunk Assessment Cervical / Trunk Assessment: Normal   Communication  Communication Communication: No difficulties   Cognition Arousal/Alertness: Awake/alert Behavior During Therapy: Anxious Overall Cognitive Status: Impaired/Different from baseline Area of Impairment: Orientation                 Orientation Level: Situation             General Comments: pt states she has not been home since her rib fx despite chart review showing pt d/c home 2 days prior to returning     General Comments  SpO2 84% on 1L [place], improved to 88% on 2L [place].             Home Living Family/patient expects to be discharged to:: Private residence Living Arrangements: Alone Available Help at Discharge: Family;Available PRN/intermittently Type of Home: Apartment Home Access: Level entry     Home Layout: One level     Bathroom Shower/Tub: Chief Strategy Officer: Handicapped height     Home Equipment: Grab bars - tub/shower;Shower seat;Grab bars - toilet;Rollator (4 wheels)          Prior Functioning/Environment Prior Level of Function : Independent/Modified Independent             Mobility Comments: Independent with ambulation; denies any recent falls; 2 L O2 at night only          OT Problem List: Decreased strength;Decreased range of motion;Decreased activity tolerance;Impaired balance (sitting and/or standing);Decreased safety awareness;Pain      OT Treatment/Interventions: Self-care/ADL training;Therapeutic exercise;Therapeutic activities;Patient/family education    OT Goals(Current goals can be found in the care plan section) Acute Rehab OT Goals Patient Stated Goal: to improve pain OT Goal Formulation: With patient Time For Goal Achievement: 09/01/21 Potential to Achieve Goals: Good ADL Goals Pt Will Perform Grooming: with modified independence;sitting Pt Will Perform Lower Body Dressing: with min assist;sit to/from stand (c LRAD PRN) Pt Will Transfer to Toilet: with min assist;ambulating;regular height toilet  OT  Frequency: Min 2X/week   Barriers to D/C: Decreased caregiver support          Co-evaluation PT/OT/SLP Co-Evaluation/Treatment: Yes Reason for Co-Treatment: To address functional/ADL transfers PT goals addressed during session: Mobility/safety with mobility OT goals addressed during session: ADL's and self-care      AM-PAC OT "6 Clicks" Daily Activity     Outcome Measure Help from another person eating meals?: None Help from another person taking care of personal grooming?: A Little Help from another person toileting, which includes using toliet, bedpan, or urinal?: A Lot Help from another person bathing (including washing, rinsing, drying)?: A Lot Help from another person to put on and taking off regular upper body clothing?: A Little Help from another person to put on and taking off regular lower body clothing?: A Lot 6 Click Score: 16   End of Session Nurse Communication: Mobility status  Activity Tolerance: Patient limited by pain Patient left: in bed;with call bell/phone within reach;with bed alarm set  OT Visit Diagnosis: Other abnormalities of gait and mobility (R26.89);Muscle weakness (generalized) (M62.81)                Time: 6712-4580 OT Time Calculation (min): 16  min Charges:  OT General Charges $OT Visit: 1 Visit OT Evaluation $OT Eval Low Complexity: 1 Low  Kathie Dike, M.S. OTR/L  08/18/21, 1:25 PM  ascom [phone]

## 2021-08-18 NOTE — Progress Notes (Signed)
PROGRESS NOTE    Bethany Villa  ZOX:[id number] DOB: 1956-01-30 DOA: 08/15/2021 PCP: [name], MD    Brief Narrative:  65 yo patient with hx of bedbugs, COPD, lifelong smoking, heart failure came in from facility due to worsening doe/sob cough with s/s of AECOPD and flu like illness.  Admitted by PCCM given severe respiratory distress and need for NIPPV.  Stabilized in ICU standpoint.  Did not require endotracheal intubation.  Respiratory status much improved.  Transferred to hospitalist service on 12/18.   Assessment & Plan:   Principal Problem:   Acute exacerbation of COPD with asthma (HCC)  Acute exacerbation of COPD Acute hypoxic and hypercarbic respiratory failure Initially required NIPPV Now weaned off, transferred to hospitalist service 12/18 Plan: Zithromax x5 days Steroids, currently on Solu-Medrol 40 mg daily.  Can taper to p.o. prednisone starting tomorrow DuoNebs Twice daily Brovana Twice daily Pulmicort As needed BiPAP Wean oxygen as tolerated, patient without home oxygen requirement  Circulatory shock Weaned off Levophed.  Blood pressure stable Plan: Follow cultures, NGTD Monitor vitals and fever curve  Acute on chronic diastolic congestive heart failure Elevated BNP, CXR with pulmonary edema Plan: Lasix as tolerated, will dose 40 mg IV x1 today in effort to wean from oxygen  AKI Suspect ATN in setting of acute illness Avoid nephrotoxins Continue Foley catheter for now, voiding trial tomorrow  History of bedbugs Not active issue Hygiene precautions   DVT prophylaxis: SQ Lovenox Code Status: Full Family Communication: None today.  Offered to call the patient declined Disposition Plan: Status is: Inpatient  Remains inpatient appropriate because: Acute hypoxic hypercarbic respiratory failure in setting of acute decompensated COPD.  24 to 48 hours prior to disposition.       Level of care: Telemetry Medical  Consultants:   None  Procedures:  None  Antimicrobials: Azithromycin   Subjective: Patient seen and examined.  Moved to floor.  Feeling better.  Respiratory status improved  Objective: Vitals:   08/17/21 1939 08/17/21 2053 08/18/21 0545 08/18/21 0801  BP: 138/84  122/75 116/69  Pulse: 80  87 93  Resp: Temp: 98.2 F (36.8 C)  98.3 F (36.8 C) (!) 97.5 F (36.4 C)  TempSrc: Oral  Oral Oral  SpO2: 96% 96% 100% 91%  Weight:      Height:        Intake/Output Summary (Last 24 hours) at 08/18/2021 1332 Last data filed at 08/18/2021 1051 Gross per 24 hour  Intake 240 ml  Output 1200 ml  Net -960 ml   Filed Weights   08/15/21 1539 08/17/21 0500  Weight: 81.6 kg 83.6 kg    Examination:  General exam: No acute distress.  Frail Respiratory system: Lung sounds diminished bilaterally.  Normal work of breathing.  3 L Cardiovascular system: S1-S2, RRR, no murmurs, trace pitting edema Gastrointestinal system: Soft, NT/ND, normal bowel sounds Central nervous system: Alert and oriented. No focal neurological deficits. Extremities: Symmetric 5 x 5 power. Skin: No rashes, lesions or ulcers Psychiatry: Judgement and insight appear normal. Mood & affect appropriate.     Data Reviewed: I have personally reviewed following labs and imaging studies  CBC: Recent Labs  Lab 08/11/21 1444 08/15/21 1542 08/16/21 0411  WBC 4.7 20.2* 12.5*  NEUTROABS  --  13.9* 11.4*  HGB 11.9* 12.7 11.4*  HCT 38.2 42.4 37.9  MCV 93.4 99.1 95.7  PLT 120* 170 141*   Basic Metabolic Panel: Recent Labs  Lab 08/15/21 1542 08/15/21  1947 08/16/21 0411  NA 136  --  134*  K 4.3  --  4.7  CL 99  --  102  CO2 32  --  28  GLUCOSE 114*  --  138*  BUN 30*  --  26*  CREATININE 1.27*  --  0.76  CALCIUM 7.9*  --  8.1*  MG  --  2.1  --    GFR: Estimated Creatinine Clearance: 74.4 mL/min (by C-G formula based on SCr of 0.76 mg/dL). Liver Function Tests: Recent Labs  Lab 08/15/21 1542  AST 51*   ALT 35  ALKPHOS 57  BILITOT 0.8  PROT 6.6  ALBUMIN 3.2*   No results for input(s): LIPASE, AMYLASE in the last 168 hours. Recent Labs  Lab 08/15/21 1710  AMMONIA 33   Coagulation Profile: Recent Labs  Lab 08/15/21 1542  INR 1.0   Cardiac Enzymes: No results for input(s): CKTOTAL, CKMB, CKMBINDEX, TROPONINI in the last 168 hours. BNP (last 3 results) No results for input(s): PROBNP in the last 8760 hours. HbA1C: Recent Labs    08/15/21 1946  HGBA1C 5.3   CBG: Recent Labs  Lab 08/17/21 0324 08/17/21 0809 08/17/21 1128 08/17/21 1535 08/17/21 1936  GLUCAP 89 104* 163* 181* 132*   Lipid Profile: No results for input(s): CHOL, HDL, LDLCALC, TRIG, CHOLHDL, LDLDIRECT in the last 72 hours. Thyroid Function Tests: No results for input(s): TSH, T4TOTAL, FREET4, T3FREE, THYROIDAB in the last 72 hours. Anemia Panel: No results for input(s): VITAMINB12, FOLATE, FERRITIN, TIBC, IRON, RETICCTPCT in the last 72 hours. Sepsis Labs: Recent Labs  Lab 08/12/21 0539 08/15/21 1542 08/15/21 1817  PROCALCITON 0.93  --   --   LATICACIDVEN  --  0.8 1.2    Recent Results (from the past 240 hour(s))  Culture, blood (routine x 2) Call MD if unable to obtain prior to antibiotics being given     Status: None   Collection Time: 08/10/21 10:35 PM   Specimen: BLOOD LEFT FOREARM  Result Value Ref Range Status   Specimen Description BLOOD LEFT FOREARM  Final   Special Requests   Final    BOTTLES DRAWN AEROBIC AND ANAEROBIC Blood Culture results may not be optimal due to an inadequate volume of blood received in culture bottles   Culture   Final    NO GROWTH 5 DAYS Performed at [facility], [address], [place], [place] [address]    Report Status 08/15/2021 FINAL  Final  Culture, blood (routine x 2) Call MD if unable to obtain prior to antibiotics being given     Status: None   Collection Time: 08/10/21 10:40 PM   Specimen: BLOOD LEFT FOREARM  Result Value Ref Range  Status   Specimen Description BLOOD LEFT FOREARM  Final   Special Requests   Final    BOTTLES DRAWN AEROBIC AND ANAEROBIC Blood Culture results may not be optimal due to an inadequate volume of blood received in culture bottles   Culture   Final    NO GROWTH 5 DAYS Performed at [facility], [address] [place], [place] [address]    Report Status 08/15/2021 FINAL  Final  Resp Panel by RT-PCR (Flu A&B, Covid) Nasopharyngeal Swab     Status: None   Collection Time: 08/11/21  5:29 AM   Specimen: Nasopharyngeal Swab; Nasopharyngeal(NP) swabs in vial transport medium  Result Value Ref Range Status   SARS Coronavirus 2 by RT PCR NEGATIVE NEGATIVE Final    Comment: (NOTE) SARS-CoV-2  target nucleic acids are NOT DETECTED.  The SARS-CoV-2 RNA is generally detectable in upper respiratory specimens during the acute phase of infection. The lowest concentration of SARS-CoV-2 viral copies this assay can detect is 138 copies/mL. A negative result does not preclude SARS-Cov-2 infection and should not be used as the sole basis for treatment or other patient management decisions. A negative result may occur with  improper specimen collection/handling, submission of specimen other than nasopharyngeal swab, presence of viral mutation(s) within the areas targeted by this assay, and inadequate number of viral copies(<138 copies/mL). A negative result must be combined with clinical observations, patient history, and epidemiological information. The expected result is Negative.  Fact Sheet for Patients:  BloggerCourse.com  Fact Sheet for Healthcare Providers:  SeriousBroker.it  This test is no t yet approved or cleared by the [place] FDA and  has been authorized for detection and/or diagnosis of SARS-CoV-2 by FDA under an Emergency Use Authorization (EUA). This EUA will remain  in effect (meaning this test can be used) for the  duration of the COVID-19 declaration under Section 564(b)(1) of the Act, 21 U.S.C.section 360bbb-3(b)(1), unless the authorization is terminated  or revoked sooner.       Influenza A by PCR NEGATIVE NEGATIVE Final   Influenza B by PCR NEGATIVE NEGATIVE Final    Comment: (NOTE) The Xpert Xpress SARS-CoV-2/FLU/RSV plus assay is intended as an aid in the diagnosis of influenza from Nasopharyngeal swab specimens and should not be used as a sole basis for treatment. Nasal washings and aspirates are unacceptable for Xpert Xpress SARS-CoV-2/FLU/RSV testing.  Fact Sheet for Patients: BloggerCourse.com  Fact Sheet for Healthcare Providers: SeriousBroker.it  This test is not yet approved or cleared by the [place] FDA and has been authorized for detection and/or diagnosis of SARS-CoV-2 by FDA under an Emergency Use Authorization (EUA). This EUA will remain in effect (meaning this test can be used) for the duration of the COVID-19 declaration under Section 564(b)(1) of the Act, 21 U.S.C. section 360bbb-3(b)(1), unless the authorization is terminated or revoked.  Performed at [facility], [address] [place], [place] [address]   Respiratory (~20 pathogens) panel by PCR     Status: None   Collection Time: 08/11/21  5:29 AM   Specimen: Nasopharyngeal Swab; Respiratory  Result Value Ref Range Status   Adenovirus NOT DETECTED NOT DETECTED Final   Coronavirus 229E NOT DETECTED NOT DETECTED Final    Comment: (NOTE) The Coronavirus on the Respiratory Panel, DOES NOT test for the novel  Coronavirus (2019 nCoV)    Coronavirus HKU1 NOT DETECTED NOT DETECTED Final   Coronavirus NL63 NOT DETECTED NOT DETECTED Final   Coronavirus OC43 NOT DETECTED NOT DETECTED Final   Metapneumovirus NOT DETECTED NOT DETECTED Final   Rhinovirus / Enterovirus NOT DETECTED NOT DETECTED Final   Influenza A NOT DETECTED NOT DETECTED Final    Influenza B NOT DETECTED NOT DETECTED Final   Parainfluenza Virus 1 NOT DETECTED NOT DETECTED Final   Parainfluenza Virus 2 NOT DETECTED NOT DETECTED Final   Parainfluenza Virus 3 NOT DETECTED NOT DETECTED Final   Parainfluenza Virus 4 NOT DETECTED NOT DETECTED Final   Respiratory Syncytial Virus NOT DETECTED NOT DETECTED Final   Bordetella pertussis NOT DETECTED NOT DETECTED Final   Bordetella Parapertussis NOT DETECTED NOT DETECTED Final   Chlamydophila pneumoniae NOT DETECTED NOT DETECTED Final   Mycoplasma pneumoniae NOT DETECTED NOT DETECTED Final    Comment: Performed at [facility], [address] [place], [place] [address]  MRSA Next Gen by PCR, Nasal     Status: None   Collection Time: 08/11/21 12:30 PM   Specimen: Nasal Mucosa; Nasal Swab  Result Value Ref Range Status   MRSA by PCR Next Gen NOT DETECTED NOT DETECTED Final    Comment: (NOTE) The GeneXpert MRSA Assay (FDA approved for NASAL specimens only), is one component of a comprehensive MRSA colonization surveillance program. It is not intended to diagnose MRSA infection nor to guide or monitor treatment for MRSA infections. Test performance is not FDA approved in patients less than 37 years old. Performed at [facility], [address] [place], [place] [address]   Resp Panel by RT-PCR (Flu A&B, Covid) Nasopharyngeal Swab     Status: None   Collection Time: 08/15/21  3:42 PM   Specimen: Nasopharyngeal Swab; Nasopharyngeal(NP) swabs in vial transport medium  Result Value Ref Range Status   SARS Coronavirus 2 by RT PCR NEGATIVE NEGATIVE Final    Comment: (NOTE) SARS-CoV-2 target nucleic acids are NOT DETECTED.  The SARS-CoV-2 RNA is generally detectable in upper respiratory specimens during the acute phase of infection. The lowest concentration of SARS-CoV-2 viral copies this assay can detect is 138 copies/mL. A negative result does not preclude SARS-Cov-2 infection and should not be used as the  sole basis for treatment or other patient management decisions. A negative result may occur with  improper specimen collection/handling, submission of specimen other than nasopharyngeal swab, presence of viral mutation(s) within the areas targeted by this assay, and inadequate number of viral copies(<138 copies/mL). A negative result must be combined with clinical observations, patient history, and epidemiological information. The expected result is Negative.  Fact Sheet for Patients:  BloggerCourse.com  Fact Sheet for Healthcare Providers:  SeriousBroker.it  This test is no t yet approved or cleared by the [place] FDA and  has been authorized for detection and/or diagnosis of SARS-CoV-2 by FDA under an Emergency Use Authorization (EUA). This EUA will remain  in effect (meaning this test can be used) for the duration of the COVID-19 declaration under Section 564(b)(1) of the Act, 21 U.S.C.section 360bbb-3(b)(1), unless the authorization is terminated  or revoked sooner.       Influenza A by PCR NEGATIVE NEGATIVE Final   Influenza B by PCR NEGATIVE NEGATIVE Final    Comment: (NOTE) The Xpert Xpress SARS-CoV-2/FLU/RSV plus assay is intended as an aid in the diagnosis of influenza from Nasopharyngeal swab specimens and should not be used as a sole basis for treatment. Nasal washings and aspirates are unacceptable for Xpert Xpress SARS-CoV-2/FLU/RSV testing.  Fact Sheet for Patients: BloggerCourse.com  Fact Sheet for Healthcare Providers: SeriousBroker.it  This test is not yet approved or cleared by the [place] FDA and has been authorized for detection and/or diagnosis of SARS-CoV-2 by FDA under an Emergency Use Authorization (EUA). This EUA will remain in effect (meaning this test can be used) for the duration of the COVID-19 declaration under Section 564(b)(1) of the  Act, 21 U.S.C. section 360bbb-3(b)(1), unless the authorization is terminated or revoked.  Performed at [facility], [address] [place], [place] [address]   Blood Culture (routine x 2)     Status: None (Preliminary result)   Collection Time: 08/15/21  3:42 PM   Specimen: BLOOD  Result Value Ref Range Status   Specimen Description BLOOD RIGHT [facility]  Final   Special Requests   Final    BOTTLES DRAWN AEROBIC AND  ANAEROBIC Blood Culture adequate volume   Culture   Final    NO GROWTH 3 DAYS Performed at [facility], [address] [place], [place] [address]    Report Status PENDING  Incomplete  Blood Culture (routine x 2)     Status: None (Preliminary result)   Collection Time: 08/15/21  3:42 PM   Specimen: BLOOD  Result Value Ref Range Status   Specimen Description BLOOD RIGHT WRIST  Final   Special Requests   Final    BOTTLES DRAWN AEROBIC AND ANAEROBIC Blood Culture adequate volume   Culture   Final    NO GROWTH 3 DAYS Performed at [facility], [address], [place], [place] [address]    Report Status PENDING  Incomplete  Respiratory (~20 pathogens) panel by PCR     Status: None   Collection Time: 08/15/21  7:40 PM   Specimen: Nasopharyngeal Swab; Respiratory  Result Value Ref Range Status   Adenovirus NOT DETECTED NOT DETECTED Final   Coronavirus 229E NOT DETECTED NOT DETECTED Final    Comment: (NOTE) The Coronavirus on the Respiratory Panel, DOES NOT test for the novel  Coronavirus (2019 nCoV)    Coronavirus HKU1 NOT DETECTED NOT DETECTED Final   Coronavirus NL63 NOT DETECTED NOT DETECTED Final   Coronavirus OC43 NOT DETECTED NOT DETECTED Final   Metapneumovirus NOT DETECTED NOT DETECTED Final   Rhinovirus / Enterovirus NOT DETECTED NOT DETECTED Final   Influenza A NOT DETECTED NOT DETECTED Final   Influenza B NOT DETECTED NOT DETECTED Final   Parainfluenza Virus 1 NOT DETECTED NOT DETECTED Final   Parainfluenza Virus 2 NOT  DETECTED NOT DETECTED Final   Parainfluenza Virus 3 NOT DETECTED NOT DETECTED Final   Parainfluenza Virus 4 NOT DETECTED NOT DETECTED Final   Respiratory Syncytial Virus NOT DETECTED NOT DETECTED Final   Bordetella pertussis NOT DETECTED NOT DETECTED Final   Bordetella Parapertussis NOT DETECTED NOT DETECTED Final   Chlamydophila pneumoniae NOT DETECTED NOT DETECTED Final   Mycoplasma pneumoniae NOT DETECTED NOT DETECTED Final    Comment: Performed at [facility], [address] [place], [place] [address]  CULTURE, BLOOD (ROUTINE X 2) w Reflex to ID Panel     Status: None (Preliminary result)   Collection Time: 08/15/21  7:47 PM   Specimen: BLOOD  Result Value Ref Range Status   Specimen Description BLOOD BLOOD LEFT HAND  Final   Special Requests   Final    BOTTLES DRAWN AEROBIC ONLY Blood Culture results may not be optimal due to an inadequate volume of blood received in culture bottles   Culture   Final    NO GROWTH 3 DAYS Performed at [facility], [address], [place], [place] [address]    Report Status PENDING  Incomplete  CULTURE, BLOOD (ROUTINE X 2) w Reflex to ID Panel     Status: None (Preliminary result)   Collection Time: 08/15/21  7:47 PM   Specimen: BLOOD  Result Value Ref Range Status   Specimen Description BLOOD BLOOD LEFT HAND  Final   Special Requests   Final    BOTTLES DRAWN AEROBIC ONLY Blood Culture results may not be optimal due to an inadequate volume of blood received in culture bottles   Culture   Final    NO GROWTH 3 DAYS Performed at [facility], [address], [place], [place] [address]    Report Status PENDING  Incomplete  MRSA Next Gen by PCR,  Nasal     Status: None   Collection Time: 08/15/21  9:20 PM   Specimen: Nasal Mucosa; Nasal Swab  Result Value Ref Range Status   MRSA by PCR Next Gen NOT DETECTED NOT DETECTED Final    Comment: (NOTE) The GeneXpert MRSA Assay (FDA approved for NASAL specimens only), is one  component of a comprehensive MRSA colonization surveillance program. It is not intended to diagnose MRSA infection nor to guide or monitor treatment for MRSA infections. Test performance is not FDA approved in patients less than 45 years old. Performed at [facility], [address] [place], [place] [address]          Radiology Studies: No results found.      Scheduled Meds:  arformoterol  15 mcg Nebulization BID   azithromycin  500 mg Oral Daily   budesonide (PULMICORT) nebulizer solution  0.25 mg Nebulization BID   Chlorhexidine Gluconate Cloth  6 each Topical Daily   clopidogrel  75 mg Oral Daily   enoxaparin (LOVENOX) injection  40 mg Subcutaneous QHS   ipratropium-albuterol  3 mL Nebulization TID   lactulose  20 g Oral BID   lidocaine  1 patch Transdermal Q24H   losartan  50 mg Oral Daily   melatonin  2.5 mg Oral QHS   methocarbamol  750 mg Oral TID   methylPREDNISolone (SOLU-MEDROL) injection  40 mg Intravenous Q24H   pantoprazole  40 mg Oral Daily   Continuous Infusions:  sodium chloride Stopped (08/15/21 1952)     LOS: 3 days    Time spent: 25 minutes    Tresa Moore, MD [facility]   If 7PM-7AM, please contact night-coverage  08/18/2021, 1:32 PM

## 2021-08-19 MED ORDER — IPRATROPIUM-ALBUTEROL 0.5-2.5 (3) MG/3ML IN SOLN
3.0000 mL | Freq: Two times a day (BID) | RESPIRATORY_TRACT | Status: DC
  Administered 2021-08-19 – 2021-08-25 (×12): 3 mL via RESPIRATORY_TRACT
  Filled 2021-08-19 (×12): qty 3

## 2021-08-19 MED ORDER — HYDROMORPHONE HCL 1 MG/ML IJ SOLN
1.0000 mg | INTRAMUSCULAR | Status: DC | PRN
  Administered 2021-08-19 – 2021-08-20 (×5): 1 mg via INTRAVENOUS
  Filled 2021-08-19 (×5): qty 1

## 2021-08-19 NOTE — Progress Notes (Signed)
CRITICAL CARE PROGRESS NOTE    Name: Bethany Villa MRN: [id number] DOB: [birth year]     LOS: 4   SUBJECTIVE FINDINGS & SIGNIFICANT EVENTS    Patient description:  65 yo patient with hx of bedbugs, COPD, lifelong smoking, heart failure came in from facility due to worsening doe/sob cough with s/s of AECOPD and flu like illness.   08/19/21- patient improved, continue current care plan.    Lines/tubes :   Microbiology/Sepsis markers: Results for orders placed or performed during the hospital encounter of 08/15/21  Resp Panel by RT-PCR (Flu A&B, Covid) Nasopharyngeal Swab     Status: None   Collection Time: 08/15/21  3:42 PM   Specimen: Nasopharyngeal Swab; Nasopharyngeal(NP) swabs in vial transport medium  Result Value Ref Range Status   SARS Coronavirus 2 by RT PCR NEGATIVE NEGATIVE Final    Comment: (NOTE) SARS-CoV-2 target nucleic acids are NOT DETECTED.  The SARS-CoV-2 RNA is generally detectable in upper respiratory specimens during the acute phase of infection. The lowest concentration of SARS-CoV-2 viral copies this assay can detect is 138 copies/mL. A negative result does not preclude SARS-Cov-2 infection and should not be used as the sole basis for treatment or other patient management decisions. A negative result may occur with  improper specimen collection/handling, submission of specimen other than nasopharyngeal swab, presence of viral mutation(s) within the areas targeted by this assay, and inadequate number of viral copies(<138 copies/mL). A negative result must be combined with clinical observations, patient history, and epidemiological information. The expected result is Negative.  Fact Sheet for Patients:  EntrepreneurPulse.com.au  Fact Sheet for Healthcare  Providers:  IncredibleEmployment.be  This test is no t yet approved or cleared by the [place] FDA and  has been authorized for detection and/or diagnosis of SARS-CoV-2 by FDA under an Emergency Use Authorization (EUA). This EUA will remain  in effect (meaning this test can be used) for the duration of the COVID-19 declaration under Section 564(b)(1) of the Act, 21 U.S.C.section 360bbb-3(b)(1), unless the authorization is terminated  or revoked sooner.       Influenza A by PCR NEGATIVE NEGATIVE Final   Influenza B by PCR NEGATIVE NEGATIVE Final    Comment: (NOTE) The Xpert Xpress SARS-CoV-2/FLU/RSV plus assay is intended as an aid in the diagnosis of influenza from Nasopharyngeal swab specimens and should not be used as a sole basis for treatment. Nasal washings and aspirates are unacceptable for Xpert Xpress SARS-CoV-2/FLU/RSV testing.  Fact Sheet for Patients: EntrepreneurPulse.com.au  Fact Sheet for Healthcare Providers: IncredibleEmployment.be  This test is not yet approved or cleared by the [place] FDA and has been authorized for detection and/or diagnosis of SARS-CoV-2 by FDA under an Emergency Use Authorization (EUA). This EUA will remain in effect (meaning this test can be used) for the duration of the COVID-19 declaration under Section 564(b)(1) of the Act, 21 U.S.C. section 360bbb-3(b)(1), unless the authorization is terminated or revoked.  Performed at [facility], [address] [place], [place] [address]   Blood Culture (routine x 2)     Status: None (Preliminary result)   Collection Time: 08/15/21  3:42 PM   Specimen: BLOOD  Result Value Ref Range Status   Specimen Description BLOOD RIGHT [facility]  Final   Special Requests   Final    BOTTLES DRAWN AEROBIC AND ANAEROBIC Blood Culture adequate volume   Culture   Final    NO GROWTH 3 DAYS Performed at [facility], [address] [place], [place] [address]    Report Status PENDING  Incomplete  Blood Culture (routine x 2)     Status: None (Preliminary result)   Collection Time: 08/15/21  3:42 PM   Specimen: BLOOD  Result Value Ref Range Status   Specimen Description BLOOD RIGHT WRIST  Final   Special Requests   Final    BOTTLES DRAWN AEROBIC AND ANAEROBIC Blood Culture adequate volume   Culture   Final    NO GROWTH 3 DAYS Performed at [facility], [address] [place], [place] [address]    Report Status PENDING  Incomplete  Respiratory (~20 pathogens) panel by PCR     Status: None   Collection Time: 08/15/21  7:40 PM   Specimen: Nasopharyngeal Swab; Respiratory  Result Value Ref Range Status   Adenovirus NOT DETECTED NOT DETECTED Final   Coronavirus 229E NOT DETECTED NOT DETECTED Final    Comment: (NOTE) The Coronavirus on the Respiratory Panel, DOES NOT test for the novel  Coronavirus (2019 nCoV)    Coronavirus HKU1 NOT DETECTED NOT DETECTED Final   Coronavirus NL63 NOT DETECTED NOT DETECTED Final   Coronavirus OC43 NOT DETECTED NOT DETECTED Final   Metapneumovirus NOT DETECTED NOT DETECTED Final   Rhinovirus / Enterovirus NOT DETECTED NOT DETECTED Final   Influenza A NOT DETECTED NOT DETECTED Final   Influenza B NOT DETECTED NOT DETECTED Final   Parainfluenza Virus 1 NOT DETECTED NOT DETECTED Final   Parainfluenza Virus 2 NOT DETECTED NOT DETECTED Final   Parainfluenza Virus 3 NOT DETECTED NOT DETECTED Final   Parainfluenza Virus 4 NOT DETECTED NOT DETECTED Final   Respiratory Syncytial Virus NOT DETECTED NOT DETECTED Final   Bordetella pertussis NOT DETECTED NOT DETECTED Final   Bordetella Parapertussis NOT DETECTED NOT DETECTED Final   Chlamydophila pneumoniae NOT DETECTED NOT DETECTED Final   Mycoplasma pneumoniae NOT DETECTED NOT DETECTED Final    Comment: Performed at [facility], [place]. [address], [place], [place] [address]  CULTURE, BLOOD (ROUTINE X 2) w Reflex to ID  Panel     Status: None (Preliminary result)   Collection Time: 08/15/21  7:47 PM   Specimen: BLOOD  Result Value Ref Range Status   Specimen Description BLOOD BLOOD LEFT HAND  Final   Special Requests   Final    BOTTLES DRAWN AEROBIC ONLY Blood Culture results may not be optimal due to an inadequate volume of blood received in culture bottles   Culture   Final    NO GROWTH 3 DAYS Performed at [facility], [address] [place], [place] [address]    Report Status PENDING  Incomplete  CULTURE, BLOOD (ROUTINE X 2) w Reflex to ID Panel     Status: None (Preliminary result)   Collection Time: 08/15/21  7:47 PM   Specimen: BLOOD  Result Value Ref Range Status   Specimen Description BLOOD BLOOD LEFT HAND  Final   Special Requests   Final    BOTTLES DRAWN AEROBIC ONLY Blood Culture results may not be optimal due to an inadequate volume of blood received in culture bottles   Culture   Final    NO GROWTH 3 DAYS Performed at [facility], [address] [place], [place] [address]    Report Status PENDING  Incomplete  MRSA Next Gen by PCR, Nasal     Status: None   Collection Time: 08/15/21  9:20 PM   Specimen: Nasal Mucosa; Nasal Swab  Result Value  Ref Range Status   MRSA by PCR Next Gen NOT DETECTED NOT DETECTED Final    Comment: (NOTE) The GeneXpert MRSA Assay (FDA approved for NASAL specimens only), is one component of a comprehensive MRSA colonization surveillance program. It is not intended to diagnose MRSA infection nor to guide or monitor treatment for MRSA infections. Test performance is not FDA approved in patients less than 32 years old. Performed at [facility], [address] [place], [place] [address]     Anti-infectives:  Anti-infectives (From admission, onward)    Start     Dose/Rate Route Frequency Ordered Stop   08/16/21 1630  azithromycin (ZITHROMAX) 500 mg in sodium chloride 0.9 % 250 mL IVPB  Status:  Discontinued        500  mg 250 mL/hr over 60 Minutes Intravenous Every 24 hours 08/16/21 0237 08/16/21 1350   08/16/21 1445  azithromycin (ZITHROMAX) tablet 500 mg        500 mg Oral Daily 08/16/21 1351 08/21/21 0959   08/15/21 1545  vancomycin (VANCOCIN) IVPB 1000 mg/200 mL premix  Status:  Discontinued        1,000 mg 200 mL/hr over 60 Minutes Intravenous  Once 08/15/21 1542 08/15/21 1548   08/15/21 1545  ceFEPIme (MAXIPIME) 2 g in sodium chloride 0.9 % 100 mL IVPB        2 g 200 mL/hr over 30 Minutes Intravenous  Once 08/15/21 1542 08/15/21 1648   08/15/21 1545  azithromycin (ZITHROMAX) 500 mg in sodium chloride 0.9 % 250 mL IVPB        500 mg 250 mL/hr over 60 Minutes Intravenous  Once 08/15/21 1542 08/15/21 1732        PAST MEDICAL HISTORY   Past Medical History:  Diagnosis Date   Anxiety    Asthma    Chronic pain    lower back   Chronic systolic CHF (congestive heart failure) ([place]) 09/22/2019   COPD (chronic obstructive pulmonary disease) (HCC)    Depression    DJD (degenerative joint disease)    Fibromyalgia    GERD (gastroesophageal reflux disease)    Headache    Hyperlipidemia    Lower extremity edema    On home oxygen therapy    has not been on since 2016   Panic attack    Shortness of breath dyspnea    Sleep disorder      SURGICAL HISTORY   Past Surgical History:  Procedure Laterality Date   ABDOMINAL SURGERY     ANKLE SURGERY     BACK SURGERY     lower   CARPAL TUNNEL RELEASE     CESAREAN SECTION     I & D KNEE WITH POLY EXCHANGE N/A 06/11/2020   Procedure: IRRIGATION AND DEBRIDEMENT KNEE WITH POLY EXCHANGE;  Surgeon: Corky Mull, MD;  Location: [facility] ORS;  Service: Orthopedics;  Laterality: N/A;   KNEE SURGERY     TONSILLECTOMY     TOTAL KNEE ARTHROPLASTY Left 04/14/2016   Procedure: TOTAL KNEE ARTHROPLASTY;  Surgeon: Earnestine Leys, MD;  Location: [facility] ORS;  Service: Orthopedics;  Laterality: Left;   TUBAL LIGATION       FAMILY HISTORY   Family History  Problem  Relation Age of Onset   Cancer Mother        beast     SOCIAL HISTORY   Social History   Tobacco Use   Smoking status: Every Day    Packs/day: 1.00    Years: 45.00  Pack years: 45.00    Types: Cigarettes   Smokeless tobacco: Never  Vaping Use   Vaping Use: Never used  Substance Use Topics   Alcohol use: Yes    Alcohol/week: 0.0 standard drinks    Comment: rare   Drug use: No     MEDICATIONS   Current Medication:  Current Facility-Administered Medications:    0.9 %  sodium chloride infusion, 250 mL, Intravenous, Continuous, Ouma, Bing Neighbors, NP, Held at 08/15/21 1952   albuterol (PROVENTIL) (2.5 MG/3ML) 0.083% nebulizer solution 2.5 mg, 2.5 mg, Nebulization, Q2H PRN, Priscella Mann, Sudheer B, MD   arformoterol (BROVANA) nebulizer solution 15 mcg, 15 mcg, Nebulization, BID, Priscella Mann, Sudheer B, MD, 15 mcg at 08/19/21 [id number]   azithromycin (ZITHROMAX) tablet 500 mg, 500 mg, Oral, Daily, Sreenath, Sudheer B, MD, 500 mg at 08/19/21 0904   budesonide (PULMICORT) nebulizer solution 0.25 mg, 0.25 mg, Nebulization, BID, Ouma, Bing Neighbors, NP, 0.25 mg at 08/19/21 [id number]   Chlorhexidine Gluconate Cloth 2 % PADS 6 each, 6 each, Topical, Daily, Ouma, Bing Neighbors, NP, 6 each at 08/19/21 0908   clopidogrel (PLAVIX) tablet 75 mg, 75 mg, Oral, Daily, Priscella Mann, Sudheer B, MD, 75 mg at 08/19/21 0904   docusate sodium (COLACE) capsule 100 mg, 100 mg, Oral, BID PRN, Ottie Glazier, MD   enoxaparin (LOVENOX) injection 40 mg, 40 mg, Subcutaneous, QHS, Jalana Moore, MD, 40 mg at 08/18/21 2143   guaiFENesin-dextromethorphan (ROBITUSSIN DM) 100-10 MG/5ML syrup 5 mL, 5 mL, Oral, Q4H PRN, Priscella Mann, Sudheer B, MD, 5 mL at 08/18/21 0949   HYDROmorphone (DILAUDID) injection 1 mg, 1 mg, Intravenous, Q4H PRN, Jennye Boroughs, MD, 1 mg at 08/19/21 1108   ipratropium-albuterol (DUONEB) 0.5-2.5 (3) MG/3ML nebulizer solution 3 mL, 3 mL, Nebulization, BID, Jennye Boroughs, MD   lactulose  (CHRONULAC) 10 GM/15ML solution 20 g, 20 g, Oral, BID, Sreenath, Sudheer B, MD, 20 g at 08/17/21 1034   lidocaine (LIDODERM) 5 % 1 patch, 1 patch, Transdermal, Q24H, Sreenath, Sudheer B, MD, 1 patch at 08/18/21 0831   losartan (COZAAR) tablet 50 mg, 50 mg, Oral, Daily, Sreenath, Sudheer B, MD, 50 mg at 08/19/21 [id number]   melatonin tablet 2.5 mg, 2.5 mg, Oral, QHS, Tonye Royalty, NP, 2.5 mg at 08/18/21 2141   methocarbamol (ROBAXIN) tablet 750 mg, 750 mg, Oral, TID, Priscella Mann, Sudheer B, MD, 750 mg at 08/19/21 0904   methylPREDNISolone sodium succinate (SOLU-MEDROL) 40 mg/mL injection 40 mg, 40 mg, Intravenous, Q24H, Mical Kicklighter, MD, 40 mg at 08/19/21 0900   oxyCODONE (Oxy IR/ROXICODONE) immediate release tablet 5 mg, 5 mg, Oral, Q4H PRN, Priscella Mann, Sudheer B, MD, 5 mg at 08/19/21 1143   pantoprazole (PROTONIX) EC tablet 40 mg, 40 mg, Oral, Daily, Priscella Mann, Sudheer B, MD, 40 mg at 08/19/21 0904   polyethylene glycol (MIRALAX / GLYCOLAX) packet 17 g, 17 g, Oral, Daily PRN, Ottie Glazier, MD    ALLERGIES   Gabapentin, Mobic [meloxicam], Penicillins, and Sulfa antibiotics    REVIEW OF SYSTEMS     10 point ros done and is negative except generalized pain   PHYSICAL EXAMINATION   Vital Signs: Temp:  [97.5 F (36.4 C)-98 F (36.7 C)] 97.5 F (36.4 C) (12/21 0743) Pulse Rate:  [76-93] 90 (12/21 1334) Resp:  [18-20] 18 (12/21 0743) BP: (121-155)/(66-107) 136/81 (12/21 0743) SpO2:  [90 %-97 %] 90 % (12/21 1334)  GENERAL:Age appropriate mild distress HEAD: Normocephalic, atraumatic.  EYES: Pupils equal, round, reactive to light.  No scleral icterus.  MOUTH: Moist mucosal membrane.  NECK: Supple. No thyromegaly. No nodules. No JVD.  PULMONARY: bilateral  rhocnhi improved from yesterday  CARDIOVASCULAR: S1 and S2. Regular rate and rhythm. No murmurs, rubs, or gallops.  GASTROINTESTINAL: Soft, nontender, non-distended. No masses. Positive bowel sounds. No hepatosplenomegaly.   MUSCULOSKELETAL: No swelling, clubbing, or edema.  NEUROLOGIC: Mild distress due to acute illness SKIN:intact,warm,dry   PERTINENT DATA     Infusions:  sodium chloride Stopped (08/15/21 1952)   Scheduled Medications:  arformoterol  15 mcg Nebulization BID   azithromycin  500 mg Oral Daily   budesonide (PULMICORT) nebulizer solution  0.25 mg Nebulization BID   Chlorhexidine Gluconate Cloth  6 each Topical Daily   clopidogrel  75 mg Oral Daily   enoxaparin (LOVENOX) injection  40 mg Subcutaneous QHS   ipratropium-albuterol  3 mL Nebulization BID   lactulose  20 g Oral BID   lidocaine  1 patch Transdermal Q24H   losartan  50 mg Oral Daily   melatonin  2.5 mg Oral QHS   methocarbamol  750 mg Oral TID   methylPREDNISolone (SOLU-MEDROL) injection  40 mg Intravenous Q24H   pantoprazole  40 mg Oral Daily   PRN Medications: albuterol, docusate sodium, guaiFENesin-dextromethorphan, HYDROmorphone (DILAUDID) injection, oxyCODONE, polyethylene glycol Hemodynamic parameters:   Intake/Output: 12/20 0701 - 12/21 0700 In: 240 [P.O.:240] Out: 2500 [Urine:2500]  Ventilator  Settings:    LAB RESULTS:  Basic Metabolic Panel: Recent Labs  Lab 08/15/21 1542 08/15/21 1947 08/16/21 0411  NA 136  --  134*  K 4.3  --  4.7  CL 99  --  102  CO2 32  --  28  GLUCOSE 114*  --  138*  BUN 30*  --  26*  CREATININE 1.27*  --  0.76  CALCIUM 7.9*  --  8.1*  MG  --  2.1  --     Liver Function Tests: Recent Labs  Lab 08/15/21 1542  AST 51*  ALT 35  ALKPHOS 57  BILITOT 0.8  PROT 6.6  ALBUMIN 3.2*    No results for input(s): LIPASE, AMYLASE in the last 168 hours. Recent Labs  Lab 08/15/21 1710  AMMONIA 33    CBC: Recent Labs  Lab 08/15/21 1542 08/16/21 0411  WBC 20.2* 12.5*  NEUTROABS 13.9* 11.4*  HGB 12.7 11.4*  HCT 42.4 37.9  MCV 99.1 95.7  PLT 170 141*    Cardiac Enzymes: No results for input(s): CKTOTAL, CKMB, CKMBINDEX, TROPONINI in the last 168  hours. BNP: Invalid input(s): POCBNP CBG: Recent Labs  Lab 08/17/21 0324 08/17/21 0809 08/17/21 1128 08/17/21 1535 08/17/21 1936  GLUCAP 89 104* 163* 181* 132*        IMAGING RESULTS:  Imaging: No results found. @PROBHOSP @ No results found.      ASSESSMENT AND PLAN    -Multidisciplinary rounds held today  Acute Hypoxic Hypercapnic Respiratory Failure   -due to  severe COPD exacerbation   - RVP- negative   - steroids - solumedrol    - Zithromax 500 IV daily    - duoneb  -continue Full NIV support- BIPAP 14/8 - 40% FiO2 -continue Bronchodilator Therapy -off vasopressors  Circulatory shock-RESOLVED   - possible cardiac vs septic   - blood cultures  - lactate  - crp  -on levophed currently   - CXR with edema caution with fluids   Acute on chronic diastilic CHF iBNP >400 CXR with pulm edema -oxygen as needed -Lasix as tolerated -follow up cardiac biomarkers ICU monitoring  Renal Failure-most likely due  to ATN-RESOLVED -follow chem 7 -follow UO -continue Foley Catheter-assess need daily Dc nephrotoxins  Bed bugs  - hygiene precautions    ID -continue IV abx as prescibed -follow up cultures  GI/Nutrition GI PROPHYLAXIS as indicated DIET-->TF's as tolerated Constipation protocol as indicated  ENDO - ICU hypoglycemic\Hyperglycemia protocol -check FSBS per protocol   ELECTROLYTES -follow labs as needed -replace as needed -pharmacy consultation   DVT/GI PRX ordered -SCDs  TRANSFUSIONS AS NEEDED MONITOR FSBS ASSESS the need for LABS as needed   Critical care provider statement:   Total critical care time: 33 minutes   Performed by: Lanney Gins MD   Critical care time was exclusive of separately billable procedures and treating other patients.   Critical care was necessary to treat or prevent imminent or life-threatening deterioration.   Critical care was time spent personally by me on the following activities: development  of treatment plan with patient and/or surrogate as well as nursing, discussions with consultants, evaluation of patient's response to treatment, examination of patient, obtaining history from patient or surrogate, ordering and performing treatments and interventions, ordering and review of laboratory studies, ordering and review of radiographic studies, pulse oximetry and re-evaluation of patient's condition.    Ottie Glazier, M.D.  Pulmonary & Critical Care Medicine       Ottie Glazier, M.D.  Division of [place]

## 2021-08-19 NOTE — Progress Notes (Incomplete)
[facility] Cardiology    SUBJECTIVE: ***   Vitals:   08/13/21 1315 08/13/21 1335 08/13/21 1410 08/13/21 1524  BP:    (!) 162/92  Pulse:  99 100 100  Resp:  19 18 18   Temp:    97.9 F (36.6 C)  TempSrc:    Oral  SpO2: 91% 90% 93% 96%  Weight:      Height:        No intake or output data in the 24 hours ending 08/19/21 0711    PHYSICAL EXAM  General: Well developed, well nourished, in no acute distress HEENT:  Normocephalic and atramatic Neck:  No JVD.  Lungs: Clear bilaterally to auscultation and percussion. Heart: HRRR . Normal S1 and S2 without gallops or murmurs.  Abdomen: Bowel sounds are positive, abdomen soft and non-tender  Msk:  Back normal, normal gait. Normal strength and tone for age. Extremities: No clubbing, cyanosis or edema.   Neuro: Alert and oriented X 3. Psych:  Good affect, responds appropriately   LABS: Basic Metabolic Panel: No results for input(s): NA, K, CL, CO2, GLUCOSE, BUN, CREATININE, CALCIUM, MG, PHOS in the last 72 hours. Liver Function Tests: No results for input(s): AST, ALT, ALKPHOS, BILITOT, PROT, ALBUMIN in the last 72 hours. No results for input(s): LIPASE, AMYLASE in the last 72 hours. CBC: No results for input(s): WBC, NEUTROABS, HGB, HCT, MCV, PLT in the last 72 hours. Cardiac Enzymes: No results for input(s): CKTOTAL, CKMB, CKMBINDEX, TROPONINI in the last 72 hours. BNP: Invalid input(s): POCBNP D-Dimer: No results for input(s): DDIMER in the last 72 hours. Hemoglobin A1C: No results for input(s): HGBA1C in the last 72 hours. Fasting Lipid Panel: No results for input(s): CHOL, HDL, LDLCALC, TRIG, CHOLHDL, LDLDIRECT in the last 72 hours. Thyroid Function Tests: No results for input(s): TSH, T4TOTAL, T3FREE, THYROIDAB in the last 72 hours.  Invalid input(s): FREET3 Anemia Panel: No results for input(s): VITAMINB12, FOLATE, FERRITIN, TIBC, IRON, RETICCTPCT in the last 72 hours.  No results found.   Echo ***  TELEMETRY:  ***:  ASSESSMENT AND PLAN:  Principal Problem:   Acute metabolic encephalopathy Active Problems:   COPD (chronic obstructive pulmonary disease) (HCC)   Depression   Acute on chronic respiratory failure with hypercapnia (HCC)   Chronic systolic CHF (congestive heart failure) (HCC)   Altered mental status   Episode of unresponsiveness   Hyperammonemia (HCC)   Chronic, continuous use of opioids   AKI (acute kidney injury) (HCC)   Hyperkalemia    1. ***   08/21/21, MD 08/19/2021 7:11 AM

## 2021-08-19 NOTE — TOC Progression Note (Signed)
Transition of Care [facility]) - Progression Note    Patient Details  Name: Bethany Villa MRN: [id number] Date of Birth: 1956-08-20  Transition of Care [facility] CM/SW Contact  Chapman Fitch, RN Phone Number: 08/19/2021, 10:18 AM  Clinical Narrative:     No bed offers.  Search extended  Expected Discharge Plan: Skilled Nursing Facility Barriers to Discharge: Continued Medical Work up  Expected Discharge Plan and Services Expected Discharge Plan: Skilled Nursing Facility       Living arrangements for the past 2 months: Single Family Home                                       Social Determinants of Health (SDOH) Interventions    Readmission Risk Interventions Readmission Risk Prevention Plan 08/18/2021  Transportation Screening Complete  HRI or Home Care Consult Complete  Social Work Consult for Recovery Care Planning/Counseling Complete  Palliative Care Screening Not Applicable  Medication Review [employer]) Complete  Some recent data might be hidden

## 2021-08-19 NOTE — Progress Notes (Addendum)
Progress Note    Bethany Villa  ZOX:[id number] DOB: 01/13/1956  DOA: 08/15/2021 PCP: [name], MD      Brief Narrative:    Medical records reviewed and are as summarized below:  Bethany Villa is a 65 y.o. female with medical history significant for COPD, chronic hypoxemic respiratory failure on 2 L/min oxygen at night, chronic diastolic CHF, tobacco use disorder, who presented to the hospital with wheezing and lethargy.      Assessment/Plan:   Principal Problem:   Acute exacerbation of COPD with asthma (HCC)   Body mass index is 31.64 kg/m. (Obesity)  Acute exacerbation of COPD: Continue IV steroids and bronchodilators  Acute on chronic diastolic CHF: Improved.  Acute on chronic hypoxemic respiratory failure, acute hypercapnic respiratory failure: Improved.  She is off of BiPAP.  She is tolerating 2 L/min oxygen via [place].  Musculoskeletal pain/right chest wall pain: Change IV morphine to IV Dilaudid for adequate pain control.  Continue oxycodone as needed for pain.  She also has chronic back pain and she was scheduled to go to the pain clinic on 08/20/2021.  However, she plans to reschedule this appointment because she is still in the hospital.  AKI: Improved  Circulatory shock: Resolved  History of bedbugs: Her son said he is working on her house including changing her sheets and furniture..  Diet Order             Diet Heart Room service appropriate? Yes; Fluid consistency: Thin  Diet effective now                      Consultants: Intensivist/pulmonologist  Procedures: None    Medications:    arformoterol  15 mcg Nebulization BID   azithromycin  500 mg Oral Daily   budesonide (PULMICORT) nebulizer solution  0.25 mg Nebulization BID   Chlorhexidine Gluconate Cloth  6 each Topical Daily   clopidogrel  75 mg Oral Daily   enoxaparin (LOVENOX) injection  40 mg Subcutaneous QHS   ipratropium-albuterol  3 mL Nebulization TID    lactulose  20 g Oral BID   lidocaine  1 patch Transdermal Q24H   losartan  50 mg Oral Daily   melatonin  2.5 mg Oral QHS   methocarbamol  750 mg Oral TID   methylPREDNISolone (SOLU-MEDROL) injection  40 mg Intravenous Q24H   pantoprazole  40 mg Oral Daily   Continuous Infusions:  sodium chloride Stopped (08/15/21 1952)     Anti-infectives (From admission, onward)    Start     Dose/Rate Route Frequency Ordered Stop   08/16/21 1630  azithromycin (ZITHROMAX) 500 mg in sodium chloride 0.9 % 250 mL IVPB  Status:  Discontinued        500 mg 250 mL/hr over 60 Minutes Intravenous Every 24 hours 08/16/21 0237 08/16/21 1350   08/16/21 1445  azithromycin (ZITHROMAX) tablet 500 mg        500 mg Oral Daily 08/16/21 1351 08/21/21 0959   08/15/21 1545  vancomycin (VANCOCIN) IVPB 1000 mg/200 mL premix  Status:  Discontinued        1,000 mg 200 mL/hr over 60 Minutes Intravenous  Once 08/15/21 1542 08/15/21 1548   08/15/21 1545  ceFEPIme (MAXIPIME) 2 g in sodium chloride 0.9 % 100 mL IVPB        2 g 200 mL/hr over 30 Minutes Intravenous  Once 08/15/21 1542 08/15/21 1648   08/15/21 1545  azithromycin (ZITHROMAX) 500 mg  in sodium chloride 0.9 % 250 mL IVPB        500 mg 250 mL/hr over 60 Minutes Intravenous  Once 08/15/21 1542 08/15/21 1732              Family Communication/Anticipated D/C date and plan/Code Status   DVT prophylaxis: enoxaparin (LOVENOX) injection 40 mg Start: 08/16/21 2200 SCDs Start: 08/15/21 1817     Code Status: Full Code  Family Communication: Plan discussed with Molli Hazard, son, at the bedside Disposition Plan: Plan to discharge to SNF in 1 to 2 days   Status is: Inpatient  Remains inpatient appropriate because: Inadequate pain control and awaiting placement           Subjective:   She complains of severe pain in the right side of her chest.  She attributes this to CPR that was done prior to admission.  Objective:    Vitals:   08/18/21 1959  08/18/21 2056 08/19/21 0350 08/19/21 0743  BP: 125/73  129/85 136/81  Pulse: 92  77 76  Resp: 20  20 18   Temp: 98 F (36.7 C)  97.9 F (36.6 C) (!) 97.5 F (36.4 C)  TempSrc:   Oral Oral  SpO2: 93% 93% 97% 95%  Weight:      Height:       No data found.   Intake/Output Summary (Last 24 hours) at 08/19/2021 1011 Last data filed at 08/19/2021 0548 Gross per 24 hour  Intake 240 ml  Output 2500 ml  Net -2260 ml   Filed Weights   08/15/21 1539 08/17/21 0500  Weight: 81.6 kg 83.6 kg    Exam:  GEN: NAD SKIN: Warm and dry EYES: EOMI ENT: MMM CV: RRR PULM: CTA B ABD: soft, obese, NT, +BS CNS: AAO x 3, non focal EXT: No edema or tenderness MSK: Right chest wall tenderness      Data Reviewed:   I have personally reviewed following labs and imaging studies:  Labs: Labs show the following:   Basic Metabolic Panel: Recent Labs  Lab 08/15/21 1542 08/15/21 1947 08/16/21 0411  NA 136  --  134*  K 4.3  --  4.7  CL 99  --  102  CO2 32  --  28  GLUCOSE 114*  --  138*  BUN 30*  --  26*  CREATININE 1.27*  --  0.76  CALCIUM 7.9*  --  8.1*  MG  --  2.1  --    GFR Estimated Creatinine Clearance: 74.4 mL/min (by C-G formula based on SCr of 0.76 mg/dL). Liver Function Tests: Recent Labs  Lab 08/15/21 1542  AST 51*  ALT 35  ALKPHOS 57  BILITOT 0.8  PROT 6.6  ALBUMIN 3.2*   No results for input(s): LIPASE, AMYLASE in the last 168 hours. Recent Labs  Lab 08/15/21 1710  AMMONIA 33   Coagulation profile Recent Labs  Lab 08/15/21 1542  INR 1.0    CBC: Recent Labs  Lab 08/15/21 1542 08/16/21 0411  WBC 20.2* 12.5*  NEUTROABS 13.9* 11.4*  HGB 12.7 11.4*  HCT 42.4 37.9  MCV 99.1 95.7  PLT 170 141*   Cardiac Enzymes: No results for input(s): CKTOTAL, CKMB, CKMBINDEX, TROPONINI in the last 168 hours. BNP (last 3 results) No results for input(s): PROBNP in the last 8760 hours. CBG: Recent Labs  Lab 08/17/21 0324 08/17/21 0809 08/17/21 1128  08/17/21 1535 08/17/21 1936  GLUCAP 89 104* 163* 181* 132*   D-Dimer: No results for input(s): DDIMER in  the last 72 hours. Hgb A1c: No results for input(s): HGBA1C in the last 72 hours. Lipid Profile: No results for input(s): CHOL, HDL, LDLCALC, TRIG, CHOLHDL, LDLDIRECT in the last 72 hours. Thyroid function studies: No results for input(s): TSH, T4TOTAL, T3FREE, THYROIDAB in the last 72 hours.  Invalid input(s): FREET3 Anemia work up: No results for input(s): VITAMINB12, FOLATE, FERRITIN, TIBC, IRON, RETICCTPCT in the last 72 hours. Sepsis Labs: Recent Labs  Lab 08/15/21 1542 08/15/21 1817 08/16/21 0411  WBC 20.2*  --  12.5*  LATICACIDVEN 0.8 1.2  --     Microbiology Recent Results (from the past 240 hour(s))  Culture, blood (routine x 2) Call MD if unable to obtain prior to antibiotics being given     Status: None   Collection Time: 08/10/21 10:35 PM   Specimen: BLOOD LEFT FOREARM  Result Value Ref Range Status   Specimen Description BLOOD LEFT FOREARM  Final   Special Requests   Final    BOTTLES DRAWN AEROBIC AND ANAEROBIC Blood Culture results may not be optimal due to an inadequate volume of blood received in culture bottles   Culture   Final    NO GROWTH 5 DAYS Performed at [facility], [address] [place], [place] [address]    Report Status 08/15/2021 FINAL  Final  Culture, blood (routine x 2) Call MD if unable to obtain prior to antibiotics being given     Status: None   Collection Time: 08/10/21 10:40 PM   Specimen: BLOOD LEFT FOREARM  Result Value Ref Range Status   Specimen Description BLOOD LEFT FOREARM  Final   Special Requests   Final    BOTTLES DRAWN AEROBIC AND ANAEROBIC Blood Culture results may not be optimal due to an inadequate volume of blood received in culture bottles   Culture   Final    NO GROWTH 5 DAYS Performed at [facility], [address] [place], [place] [address]    Report Status 08/15/2021 FINAL  Final   Resp Panel by RT-PCR (Flu A&B, Covid) Nasopharyngeal Swab     Status: None   Collection Time: 08/11/21  5:29 AM   Specimen: Nasopharyngeal Swab; Nasopharyngeal(NP) swabs in vial transport medium  Result Value Ref Range Status   SARS Coronavirus 2 by RT PCR NEGATIVE NEGATIVE Final    Comment: (NOTE) SARS-CoV-2 target nucleic acids are NOT DETECTED.  The SARS-CoV-2 RNA is generally detectable in upper respiratory specimens during the acute phase of infection. The lowest concentration of SARS-CoV-2 viral copies this assay can detect is 138 copies/mL. A negative result does not preclude SARS-Cov-2 infection and should not be used as the sole basis for treatment or other patient management decisions. A negative result may occur with  improper specimen collection/handling, submission of specimen other than nasopharyngeal swab, presence of viral mutation(s) within the areas targeted by this assay, and inadequate number of viral copies(<138 copies/mL). A negative result must be combined with clinical observations, patient history, and epidemiological information. The expected result is Negative.  Fact Sheet for Patients:  BloggerCourse.com  Fact Sheet for Healthcare Providers:  SeriousBroker.it  This test is no t yet approved or cleared by the [place] FDA and  has been authorized for detection and/or diagnosis of SARS-CoV-2 by FDA under an Emergency Use Authorization (EUA). This EUA will remain  in effect (meaning this test can be used) for the duration of the COVID-19 declaration under Section 564(b)(1) of the Act, 21 U.S.C.section 360bbb-3(b)(1), unless the authorization is  terminated  or revoked sooner.       Influenza A by PCR NEGATIVE NEGATIVE Final   Influenza B by PCR NEGATIVE NEGATIVE Final    Comment: (NOTE) The Xpert Xpress SARS-CoV-2/FLU/RSV plus assay is intended as an aid in the diagnosis of influenza from  Nasopharyngeal swab specimens and should not be used as a sole basis for treatment. Nasal washings and aspirates are unacceptable for Xpert Xpress SARS-CoV-2/FLU/RSV testing.  Fact Sheet for Patients: BloggerCourse.com  Fact Sheet for Healthcare Providers: SeriousBroker.it  This test is not yet approved or cleared by the [place] FDA and has been authorized for detection and/or diagnosis of SARS-CoV-2 by FDA under an Emergency Use Authorization (EUA). This EUA will remain in effect (meaning this test can be used) for the duration of the COVID-19 declaration under Section 564(b)(1) of the Act, 21 U.S.C. section 360bbb-3(b)(1), unless the authorization is terminated or revoked.  Performed at [facility], [address] [place], [place] [address]   Respiratory (~20 pathogens) panel by PCR     Status: None   Collection Time: 08/11/21  5:29 AM   Specimen: Nasopharyngeal Swab; Respiratory  Result Value Ref Range Status   Adenovirus NOT DETECTED NOT DETECTED Final   Coronavirus 229E NOT DETECTED NOT DETECTED Final    Comment: (NOTE) The Coronavirus on the Respiratory Panel, DOES NOT test for the novel  Coronavirus (2019 nCoV)    Coronavirus HKU1 NOT DETECTED NOT DETECTED Final   Coronavirus NL63 NOT DETECTED NOT DETECTED Final   Coronavirus OC43 NOT DETECTED NOT DETECTED Final   Metapneumovirus NOT DETECTED NOT DETECTED Final   Rhinovirus / Enterovirus NOT DETECTED NOT DETECTED Final   Influenza A NOT DETECTED NOT DETECTED Final   Influenza B NOT DETECTED NOT DETECTED Final   Parainfluenza Virus 1 NOT DETECTED NOT DETECTED Final   Parainfluenza Virus 2 NOT DETECTED NOT DETECTED Final   Parainfluenza Virus 3 NOT DETECTED NOT DETECTED Final   Parainfluenza Virus 4 NOT DETECTED NOT DETECTED Final   Respiratory Syncytial Virus NOT DETECTED NOT DETECTED Final   Bordetella pertussis NOT DETECTED NOT DETECTED Final    Bordetella Parapertussis NOT DETECTED NOT DETECTED Final   Chlamydophila pneumoniae NOT DETECTED NOT DETECTED Final   Mycoplasma pneumoniae NOT DETECTED NOT DETECTED Final    Comment: Performed at [facility], [address] [place], [place] [address]  MRSA Next Gen by PCR, Nasal     Status: None   Collection Time: 08/11/21 12:30 PM   Specimen: Nasal Mucosa; Nasal Swab  Result Value Ref Range Status   MRSA by PCR Next Gen NOT DETECTED NOT DETECTED Final    Comment: (NOTE) The GeneXpert MRSA Assay (FDA approved for NASAL specimens only), is one component of a comprehensive MRSA colonization surveillance program. It is not intended to diagnose MRSA infection nor to guide or monitor treatment for MRSA infections. Test performance is not FDA approved in patients less than 29 years old. Performed at [facility], [address] [place], [place] [address]   Resp Panel by RT-PCR (Flu A&B, Covid) Nasopharyngeal Swab     Status: None   Collection Time: 08/15/21  3:42 PM   Specimen: Nasopharyngeal Swab; Nasopharyngeal(NP) swabs in vial transport medium  Result Value Ref Range Status   SARS Coronavirus 2 by RT PCR NEGATIVE NEGATIVE Final    Comment: (NOTE) SARS-CoV-2 target nucleic acids are NOT DETECTED.  The SARS-CoV-2 RNA is generally detectable in upper respiratory specimens during the acute  phase of infection. The lowest concentration of SARS-CoV-2 viral copies this assay can detect is 138 copies/mL. A negative result does not preclude SARS-Cov-2 infection and should not be used as the sole basis for treatment or other patient management decisions. A negative result may occur with  improper specimen collection/handling, submission of specimen other than nasopharyngeal swab, presence of viral mutation(s) within the areas targeted by this assay, and inadequate number of viral copies(<138 copies/mL). A negative result must be combined with clinical observations, patient  history, and epidemiological information. The expected result is Negative.  Fact Sheet for Patients:  BloggerCourse.com  Fact Sheet for Healthcare Providers:  SeriousBroker.it  This test is no t yet approved or cleared by the [place] FDA and  has been authorized for detection and/or diagnosis of SARS-CoV-2 by FDA under an Emergency Use Authorization (EUA). This EUA will remain  in effect (meaning this test can be used) for the duration of the COVID-19 declaration under Section 564(b)(1) of the Act, 21 U.S.C.section 360bbb-3(b)(1), unless the authorization is terminated  or revoked sooner.       Influenza A by PCR NEGATIVE NEGATIVE Final   Influenza B by PCR NEGATIVE NEGATIVE Final    Comment: (NOTE) The Xpert Xpress SARS-CoV-2/FLU/RSV plus assay is intended as an aid in the diagnosis of influenza from Nasopharyngeal swab specimens and should not be used as a sole basis for treatment. Nasal washings and aspirates are unacceptable for Xpert Xpress SARS-CoV-2/FLU/RSV testing.  Fact Sheet for Patients: BloggerCourse.com  Fact Sheet for Healthcare Providers: SeriousBroker.it  This test is not yet approved or cleared by the [place] FDA and has been authorized for detection and/or diagnosis of SARS-CoV-2 by FDA under an Emergency Use Authorization (EUA). This EUA will remain in effect (meaning this test can be used) for the duration of the COVID-19 declaration under Section 564(b)(1) of the Act, 21 U.S.C. section 360bbb-3(b)(1), unless the authorization is terminated or revoked.  Performed at [facility], [address] [place], [place] [address]   Blood Culture (routine x 2)     Status: None (Preliminary result)   Collection Time: 08/15/21  3:42 PM   Specimen: BLOOD  Result Value Ref Range Status   Specimen Description BLOOD RIGHT [facility]  Final   Special  Requests   Final    BOTTLES DRAWN AEROBIC AND ANAEROBIC Blood Culture adequate volume   Culture   Final    NO GROWTH 3 DAYS Performed at [facility], [address] [place], [place] [address]    Report Status PENDING  Incomplete  Blood Culture (routine x 2)     Status: None (Preliminary result)   Collection Time: 08/15/21  3:42 PM   Specimen: BLOOD  Result Value Ref Range Status   Specimen Description BLOOD RIGHT WRIST  Final   Special Requests   Final    BOTTLES DRAWN AEROBIC AND ANAEROBIC Blood Culture adequate volume   Culture   Final    NO GROWTH 3 DAYS Performed at [facility], [address] [place], [place] [address]    Report Status PENDING  Incomplete  Respiratory (~20 pathogens) panel by PCR     Status: None   Collection Time: 08/15/21  7:40 PM   Specimen: Nasopharyngeal Swab; Respiratory  Result Value Ref Range Status   Adenovirus NOT DETECTED NOT DETECTED Final   Coronavirus 229E NOT DETECTED NOT DETECTED Final    Comment: (NOTE) The Coronavirus on the Respiratory Panel, DOES NOT test for the  novel  Coronavirus (2019 nCoV)    Coronavirus HKU1 NOT DETECTED NOT DETECTED Final   Coronavirus NL63 NOT DETECTED NOT DETECTED Final   Coronavirus OC43 NOT DETECTED NOT DETECTED Final   Metapneumovirus NOT DETECTED NOT DETECTED Final   Rhinovirus / Enterovirus NOT DETECTED NOT DETECTED Final   Influenza A NOT DETECTED NOT DETECTED Final   Influenza B NOT DETECTED NOT DETECTED Final   Parainfluenza Virus 1 NOT DETECTED NOT DETECTED Final   Parainfluenza Virus 2 NOT DETECTED NOT DETECTED Final   Parainfluenza Virus 3 NOT DETECTED NOT DETECTED Final   Parainfluenza Virus 4 NOT DETECTED NOT DETECTED Final   Respiratory Syncytial Virus NOT DETECTED NOT DETECTED Final   Bordetella pertussis NOT DETECTED NOT DETECTED Final   Bordetella Parapertussis NOT DETECTED NOT DETECTED Final   Chlamydophila pneumoniae NOT DETECTED NOT DETECTED Final   Mycoplasma  pneumoniae NOT DETECTED NOT DETECTED Final    Comment: Performed at [facility], [address] [place], [place] [address]  CULTURE, BLOOD (ROUTINE X 2) w Reflex to ID Panel     Status: None (Preliminary result)   Collection Time: 08/15/21  7:47 PM   Specimen: BLOOD  Result Value Ref Range Status   Specimen Description BLOOD BLOOD LEFT HAND  Final   Special Requests   Final    BOTTLES DRAWN AEROBIC ONLY Blood Culture results may not be optimal due to an inadequate volume of blood received in culture bottles   Culture   Final    NO GROWTH 3 DAYS Performed at [facility], [address], [place], [place] [address]    Report Status PENDING  Incomplete  CULTURE, BLOOD (ROUTINE X 2) w Reflex to ID Panel     Status: None (Preliminary result)   Collection Time: 08/15/21  7:47 PM   Specimen: BLOOD  Result Value Ref Range Status   Specimen Description BLOOD BLOOD LEFT HAND  Final   Special Requests   Final    BOTTLES DRAWN AEROBIC ONLY Blood Culture results may not be optimal due to an inadequate volume of blood received in culture bottles   Culture   Final    NO GROWTH 3 DAYS Performed at [facility], [address] [place], [place] [address]    Report Status PENDING  Incomplete  MRSA Next Gen by PCR, Nasal     Status: None   Collection Time: 08/15/21  9:20 PM   Specimen: Nasal Mucosa; Nasal Swab  Result Value Ref Range Status   MRSA by PCR Next Gen NOT DETECTED NOT DETECTED Final    Comment: (NOTE) The GeneXpert MRSA Assay (FDA approved for NASAL specimens only), is one component of a comprehensive MRSA colonization surveillance program. It is not intended to diagnose MRSA infection nor to guide or monitor treatment for MRSA infections. Test performance is not FDA approved in patients less than 70 years old. Performed at [facility], [address] [place], [place] [address]     Procedures and diagnostic studies:  No results  found.             LOS: 4 days   Lochlan Grygiel  [facility]   Pager on [email or web address]. If 7PM-7AM, please contact night-coverage at [email or web address]     08/19/2021, 10:11 AM

## 2021-08-19 NOTE — Progress Notes (Signed)
Physical Therapy Treatment Patient Details Name: Bethany Villa MRN: [id number] DOB: October 24, 1955 Today's Date: 08/19/2021   History of Present Illness Pt is a 65 y.o. female presenting to hospital 12/17 with AMS and respiratory distress; also hypotensive and pain all over.  Recent hospitalization for PNA.  Pt admitted with acute exacerbation of COPD, acute hypoxic and hypercarbic respiratory failure, circulatory shock, acute on chronic diastolic CHF, AKI, and bed bugs.  PMH includes COPD, CHF, anxiety, chronic LBP, fibromyalgia, panic attack, sleep disorder, ankle sx, low back sx, CTR, knee sx L, h/o bedbugs.    PT Comments    Patient tolerated session poorly, however was agreeable to treatment. Patient reported 10/10 pain and self terminated session rolling 1 log roll to the L. Despite max encouragement patient was addiment with terminating therapy. Patient was able to demonstrate decreased assistance with log rolling to the left, only requiring Min A. Patient was only able to tolerate lying in left sidelying for ~5 seconds due to patient reporting it is hard to breath before returning back to supine. Patient required re-education on how to perform a log roll. Patient would benefit from skilled physical therapy during acute hospitalization to address noted impairments and functional limitations. Continue to recommend SNF following acute hospitalization.   Recommendations for follow up therapy are one component of a multi-disciplinary discharge planning process, led by the attending physician.  Recommendations may be updated based on patient status, additional functional criteria and insurance authorization.  Follow Up Recommendations  Skilled nursing-short term rehab (<3 hours/day)     Assistance Recommended at Discharge Frequent or constant Supervision/Assistance  Equipment Recommendations  Rolling walker (2 wheels);BSC/3in1    Recommendations for Other Services       Precautions /  Restrictions Precautions Precautions: Fall Restrictions Weight Bearing Restrictions: No     Mobility  Bed Mobility Overal bed mobility: Needs Assistance Bed Mobility: Rolling Rolling: Mod assist (required LUE support from bed rail; completed L supine to L sidelying roll through log roll)         General bed mobility comments: 3/4th logroll towards L side (limited d/t rib pain); vc's for technique    Transfers                   General transfer comment: unable to assess--pt declined further mobility d/t rib pain    Ambulation/Gait                   Stairs             Wheelchair Mobility    Modified Rankin (Stroke Patients Only)       Balance                                            Cognition Arousal/Alertness: Awake/alert Behavior During Therapy: Anxious Overall Cognitive Status: Impaired/Different from baseline                                          Exercises Other Exercises Other Exercises: patient re-educated on log roll    General Comments        Pertinent Vitals/Pain Pain Score: 10-Worst pain ever Pain Location: R ribs Pain Descriptors / Indicators: Aching;Sore;Tender Pain Intervention(s): Limited activity within patient's tolerance;Monitored during session;Repositioned  Home Living                          Prior Function            PT Goals (current goals can now be found in the care plan section) Acute Rehab PT Goals Patient Stated Goal: to improve pain PT Goal Formulation: With patient Time For Goal Achievement: 09/01/21 Potential to Achieve Goals: Fair Progress towards PT goals: Progressing toward goals    Frequency    Min 2X/week      PT Plan Current plan remains appropriate    Co-evaluation              AM-PAC PT "6 Clicks" Mobility   Outcome Measure  Help needed turning from your back to your side while in a flat bed without using  bedrails?: A Lot Help needed moving from lying on your back to sitting on the side of a flat bed without using bedrails?: A Lot Help needed moving to and from a bed to a chair (including a wheelchair)?: A Lot Help needed standing up from a chair using your arms (e.g., wheelchair or bedside chair)?: A Lot Help needed to walk in hospital room?: A Lot Help needed climbing 3-5 steps with a railing? : Total 6 Click Score: 11    End of Session Equipment Utilized During Treatment: Oxygen Activity Tolerance: Patient limited by pain Patient left: in bed;with call bell/phone within reach;with bed alarm set Nurse Communication: Mobility status;Patient requests pain meds PT Visit Diagnosis: Other abnormalities of gait and mobility (R26.89);Muscle weakness (generalized) (M62.81);Pain Pain - Right/Left: Right Pain - part of body:  (Rib)     Time: 1950-9326 PT Time Calculation (min) (ACUTE ONLY): 10 min  Charges:  $Therapeutic Activity: 8-22 mins                     Angelica Ran, PT  08/19/21. 1:48 PM

## 2021-08-20 ENCOUNTER — Encounter: Payer: Medicare Other | Admitting: Student in an Organized Health Care Education/Training Program

## 2021-08-20 LAB — CBC WITH DIFFERENTIAL/PLATELET
Abs Immature Granulocytes: 0.48 10*3/uL — ABNORMAL HIGH (ref 0.00–0.07)
Basophils Absolute: 0.1 10*3/uL (ref 0.0–0.1)
Basophils Relative: 1 %
Eosinophils Absolute: 0.2 10*3/uL (ref 0.0–0.5)
Eosinophils Relative: 1 %
HCT: 40.3 % (ref 36.0–46.0)
Hemoglobin: 12.6 g/dL (ref 12.0–15.0)
Immature Granulocytes: 3 %
Lymphocytes Relative: 32 %
Lymphs Abs: 4.8 10*3/uL — ABNORMAL HIGH (ref 0.7–4.0)
MCH: 28.8 pg (ref 26.0–34.0)
MCHC: 31.3 g/dL (ref 30.0–36.0)
MCV: 92 fL (ref 80.0–100.0)
Monocytes Absolute: 1.4 10*3/uL — ABNORMAL HIGH (ref 0.1–1.0)
Monocytes Relative: 9 %
Neutro Abs: 8.2 10*3/uL — ABNORMAL HIGH (ref 1.7–7.7)
Neutrophils Relative %: 54 %
Platelets: 202 10*3/uL (ref 150–400)
RBC: 4.38 MIL/uL (ref 3.87–5.11)
RDW: 13.9 % (ref 11.5–15.5)
WBC: 15 10*3/uL — ABNORMAL HIGH (ref 4.0–10.5)
nRBC: 0 % (ref 0.0–0.2)

## 2021-08-20 LAB — CULTURE, BLOOD (ROUTINE X 2)
Culture: NO GROWTH
Culture: NO GROWTH
Culture: NO GROWTH
Culture: NO GROWTH
Special Requests: ADEQUATE
Special Requests: ADEQUATE

## 2021-08-20 MED ORDER — HYDROMORPHONE HCL 1 MG/ML IJ SOLN
0.5000 mg | INTRAMUSCULAR | Status: DC | PRN
  Administered 2021-08-20 – 2021-08-22 (×9): 0.5 mg via INTRAVENOUS
  Filled 2021-08-20 (×9): qty 0.5

## 2021-08-20 NOTE — Progress Notes (Signed)
OT Cancellation Note  Patient Details Name: Bethany Villa MRN: [id number] DOB: 05/03/1956   Cancelled Treatment:    Reason Eval/Treat Not Completed: Pain limiting ability to participate;Patient declined, no reason specified. Chart reviewed. Pt premedicated for session, upon arrival pt reclined in bed with lights off stating "I'm waiting for these pain medicines to kick in so I can take a nap." Pt declines therapy session this date 2/2 pain. Instructed on importance of OOB mobility for functional strengthening. Will continue to follow POC.  Kathie Dike, M.S. OTR/L  08/20/21, 1:31 PM  ascom [phone]

## 2021-08-20 NOTE — TOC Progression Note (Signed)
Transition of Care [facility]) - Progression Note    Patient Details  Name: Bethany Villa MRN: [id number] Date of Birth: 01-26-1956  [facility] CM/SW Contact  Chapman Fitch, RN Phone Number: 08/20/2021, 3:23 PM  Clinical Narrative:    Per facility request confirmed with MD that Brovona could be changed to albuterol and duonebs, and patient confirms that she no longer uses [facility].   [facility] Kidney at [facility] confirms they can offer a bed.  Started auth in navi portal    Expected Discharge Plan: Skilled Nursing Facility Barriers to Discharge: Continued Medical Work up  Expected Discharge Plan and Services Expected Discharge Plan: Skilled Nursing Facility       Living arrangements for the past 2 months: Single Family Home                                       Social Determinants of Health (SDOH) Interventions    Readmission Risk Interventions Readmission Risk Prevention Plan 08/18/2021  Transportation Screening Complete  HRI or Home Care Consult Complete  Social Work Consult for Recovery Care Planning/Counseling Complete  Palliative Care Screening Not Applicable  Medication Review Oceanographer) Complete  Some recent data might be hidden

## 2021-08-20 NOTE — Progress Notes (Addendum)
Progress Note    Bethany Villa  EAV:[id number] DOB: December 06, 1955  DOA: 08/15/2021 PCP: [name], MD      Brief Narrative:    Medical records reviewed and are as summarized below:  Bethany Villa is a 65 y.o. female with medical history significant for COPD, chronic hypoxemic respiratory failure on 2 L/min oxygen at night, chronic diastolic CHF, tobacco use disorder, who presented to the hospital with wheezing and lethargy.      Assessment/Plan:   Principal Problem:   Acute exacerbation of COPD with asthma (HCC)   Body mass index is 31.64 kg/m. (Obesity)  Acute exacerbation of COPD: She completed steroids.  Continue bronchodilators.    Acute on chronic diastolic CHF: Improved.  Acute on chronic hypoxemic respiratory failure, acute hypercapnic respiratory failure: Improved.  She is tolerating 2 L/min oxygen via nasal cannula.  She uses 2 L/min oxygen at night.  Musculoskeletal pain/right chest wall pain: Continue IV Dilaudid and oxycodone as needed for pain.  She also has chronic back pain and she was scheduled to go to the pain clinic on 08/20/2021.  She will follow-up with her pain clinic at discharge.  AKI: Improved  Circulatory shock: Resolved  History of bedbugs: No acute issues  From respiratory standpoint, patient is stable.  However, she says her pain is uncontrolled.  She was told avoiding cigarette smoking may help reduce readmission for COPD exacerbation.  She was also told prolonged stay in the hospital would not necessarily reduce readmission and may actually increase her risk for hospital-acquired infections. She has been encouraged to participate in therapy.   Diet Order             Diet Heart Room service appropriate? Yes; Fluid consistency: Thin  Diet effective now                      Consultants: Intensivist/pulmonologist  Procedures: None    Medications:    arformoterol  15 mcg Nebulization BID   budesonide  (PULMICORT) nebulizer solution  0.25 mg Nebulization BID   clopidogrel  75 mg Oral Daily   enoxaparin (LOVENOX) injection  40 mg Subcutaneous QHS   ipratropium-albuterol  3 mL Nebulization BID   lactulose  20 g Oral BID   lidocaine  1 patch Transdermal Q24H   losartan  50 mg Oral Daily   melatonin  2.5 mg Oral QHS   methocarbamol  750 mg Oral TID   pantoprazole  40 mg Oral Daily   Continuous Infusions:  sodium chloride Stopped (08/15/21 1952)     Anti-infectives (From admission, onward)    Start     Dose/Rate Route Frequency Ordered Stop   08/16/21 1630  azithromycin (ZITHROMAX) 500 mg in sodium chloride 0.9 % 250 mL IVPB  Status:  Discontinued        500 mg 250 mL/hr over 60 Minutes Intravenous Every 24 hours 08/16/21 0237 08/16/21 1350   08/16/21 1445  azithromycin (ZITHROMAX) tablet 500 mg        500 mg Oral Daily 08/16/21 1351 08/20/21 1025   08/15/21 1545  vancomycin (VANCOCIN) IVPB 1000 mg/200 mL premix  Status:  Discontinued        1,000 mg 200 mL/hr over 60 Minutes Intravenous  Once 08/15/21 1542 08/15/21 1548   08/15/21 1545  ceFEPIme (MAXIPIME) 2 g in sodium chloride 0.9 % 100 mL IVPB        2 g 200 mL/hr over 30 Minutes Intravenous  Once 08/15/21 1542 08/15/21 1648   08/15/21 1545  azithromycin (ZITHROMAX) 500 mg in sodium chloride 0.9 % 250 mL IVPB        500 mg 250 mL/hr over 60 Minutes Intravenous  Once 08/15/21 1542 08/15/21 1732              Family Communication/Anticipated D/C date and plan/Code Status   DVT prophylaxis: enoxaparin (LOVENOX) injection 40 mg Start: 08/16/21 2200 SCDs Start: 08/15/21 1817     Code Status: Full Code  Family Communication: None  Disposition Plan: Plan to discharge to SNF in 1 to 2 days   Status is: Inpatient  Remains inpatient appropriate because: Inadequate pain control and awaiting placement           Subjective:   C/o right-sided chest pain.  No shortness of breath.  She says she does not feel  ready to be discharged her and she is concerned about being discharged too soon.  Objective:    Vitals:   08/19/21 2103 08/19/21 2123 08/20/21 0521 08/20/21 0825  BP:  127/83 122/78 117/84  Pulse:  85 73 81  Resp:  18 20 18   Temp:  98.3 F (36.8 C) 97.7 F (36.5 C) (!) 97.4 F (36.3 C)  TempSrc:  Oral Oral   SpO2: 94% 96% 98% 100%  Weight:      Height:       No data found.   Intake/Output Summary (Last 24 hours) at 08/20/2021 1227 Last data filed at 08/20/2021 1153 Gross per 24 hour  Intake 480 ml  Output 2350 ml  Net -1870 ml   Filed Weights   08/15/21 1539 08/17/21 0500  Weight: 81.6 kg 83.6 kg    Exam:  GEN: NAD SKIN: No rash EYES: EOMI ENT: MMM CV: RRR PULM: CTA B ABD: soft, obese, NT, +BS CNS: AAO x 3, non focal EXT: No edema or tenderness MSK: Right chest wall tenderness       Data Reviewed:   I have personally reviewed following labs and imaging studies:  Labs: Labs show the following:   Basic Metabolic Panel: Recent Labs  Lab 08/15/21 1542 08/15/21 1947 08/16/21 0411  NA 136  --  134*  K 4.3  --  4.7  CL 99  --  102  CO2 32  --  28  GLUCOSE 114*  --  138*  BUN 30*  --  26*  CREATININE 1.27*  --  0.76  CALCIUM 7.9*  --  8.1*  MG  --  2.1  --    GFR Estimated Creatinine Clearance: 74.4 mL/min (by C-G formula based on SCr of 0.76 mg/dL). Liver Function Tests: Recent Labs  Lab 08/15/21 1542  AST 51*  ALT 35  ALKPHOS 57  BILITOT 0.8  PROT 6.6  ALBUMIN 3.2*   No results for input(s): LIPASE, AMYLASE in the last 168 hours. Recent Labs  Lab 08/15/21 1710  AMMONIA 33   Coagulation profile Recent Labs  Lab 08/15/21 1542  INR 1.0    CBC: Recent Labs  Lab 08/15/21 1542 08/16/21 0411  WBC 20.2* 12.5*  NEUTROABS 13.9* 11.4*  HGB 12.7 11.4*  HCT 42.4 37.9  MCV 99.1 95.7  PLT 170 141*   Cardiac Enzymes: No results for input(s): CKTOTAL, CKMB, CKMBINDEX, TROPONINI in the last 168 hours. BNP (last 3 results) No  results for input(s): PROBNP in the last 8760 hours. CBG: Recent Labs  Lab 08/17/21 0324 08/17/21 0809 08/17/21 1128 08/17/21 1535 08/17/21 1936  GLUCAP  89 104* 163* 181* 132*   D-Dimer: No results for input(s): DDIMER in the last 72 hours. Hgb A1c: No results for input(s): HGBA1C in the last 72 hours. Lipid Profile: No results for input(s): CHOL, HDL, LDLCALC, TRIG, CHOLHDL, LDLDIRECT in the last 72 hours. Thyroid function studies: No results for input(s): TSH, T4TOTAL, T3FREE, THYROIDAB in the last 72 hours.  Invalid input(s): FREET3 Anemia work up: No results for input(s): VITAMINB12, FOLATE, FERRITIN, TIBC, IRON, RETICCTPCT in the last 72 hours. Sepsis Labs: Recent Labs  Lab 08/15/21 1542 08/15/21 1817 08/16/21 0411  WBC 20.2*  --  12.5*  LATICACIDVEN 0.8 1.2  --     Microbiology Recent Results (from the past 240 hour(s))  Culture, blood (routine x 2) Call MD if unable to obtain prior to antibiotics being given     Status: None   Collection Time: 08/10/21 10:35 PM   Specimen: BLOOD LEFT FOREARM  Result Value Ref Range Status   Specimen Description BLOOD LEFT FOREARM  Final   Special Requests   Final    BOTTLES DRAWN AEROBIC AND ANAEROBIC Blood Culture results may not be optimal due to an inadequate volume of blood received in culture bottles   Culture   Final    NO GROWTH 5 DAYS Performed at [facility], [address] [place], [place] [address]    Report Status 08/15/2021 FINAL  Final  Culture, blood (routine x 2) Call MD if unable to obtain prior to antibiotics being given     Status: None   Collection Time: 08/10/21 10:40 PM   Specimen: BLOOD LEFT FOREARM  Result Value Ref Range Status   Specimen Description BLOOD LEFT FOREARM  Final   Special Requests   Final    BOTTLES DRAWN AEROBIC AND ANAEROBIC Blood Culture results may not be optimal due to an inadequate volume of blood received in culture bottles   Culture   Final    NO GROWTH 5  DAYS Performed at [facility], [address] [place], [place] [address]    Report Status 08/15/2021 FINAL  Final  Resp Panel by RT-PCR (Flu A&B, Covid) Nasopharyngeal Swab     Status: None   Collection Time: 08/11/21  5:29 AM   Specimen: Nasopharyngeal Swab; Nasopharyngeal(NP) swabs in vial transport medium  Result Value Ref Range Status   SARS Coronavirus 2 by RT PCR NEGATIVE NEGATIVE Final    Comment: (NOTE) SARS-CoV-2 target nucleic acids are NOT DETECTED.  The SARS-CoV-2 RNA is generally detectable in upper respiratory specimens during the acute phase of infection. The lowest concentration of SARS-CoV-2 viral copies this assay can detect is 138 copies/mL. A negative result does not preclude SARS-Cov-2 infection and should not be used as the sole basis for treatment or other patient management decisions. A negative result may occur with  improper specimen collection/handling, submission of specimen other than nasopharyngeal swab, presence of viral mutation(s) within the areas targeted by this assay, and inadequate number of viral copies(<138 copies/mL). A negative result must be combined with clinical observations, patient history, and epidemiological information. The expected result is Negative.  Fact Sheet for Patients:  BloggerCourse.com  Fact Sheet for Healthcare Providers:  SeriousBroker.it  This test is no t yet approved or cleared by the [place] FDA and  has been authorized for detection and/or diagnosis of SARS-CoV-2 by FDA under an Emergency Use Authorization (EUA). This EUA will remain  in effect (meaning this test can be used) for the duration of the COVID-19  declaration under Section 564(b)(1) of the Act, 21 U.S.C.section 360bbb-3(b)(1), unless the authorization is terminated  or revoked sooner.       Influenza A by PCR NEGATIVE NEGATIVE Final   Influenza B by PCR NEGATIVE NEGATIVE Final     Comment: (NOTE) The Xpert Xpress SARS-CoV-2/FLU/RSV plus assay is intended as an aid in the diagnosis of influenza from Nasopharyngeal swab specimens and should not be used as a sole basis for treatment. Nasal washings and aspirates are unacceptable for Xpert Xpress SARS-CoV-2/FLU/RSV testing.  Fact Sheet for Patients: BloggerCourse.com  Fact Sheet for Healthcare Providers: SeriousBroker.it  This test is not yet approved or cleared by the [place] FDA and has been authorized for detection and/or diagnosis of SARS-CoV-2 by FDA under an Emergency Use Authorization (EUA). This EUA will remain in effect (meaning this test can be used) for the duration of the COVID-19 declaration under Section 564(b)(1) of the Act, 21 U.S.C. section 360bbb-3(b)(1), unless the authorization is terminated or revoked.  Performed at [facility], [address] [place], [place] [address]   Respiratory (~20 pathogens) panel by PCR     Status: None   Collection Time: 08/11/21  5:29 AM   Specimen: Nasopharyngeal Swab; Respiratory  Result Value Ref Range Status   Adenovirus NOT DETECTED NOT DETECTED Final   Coronavirus 229E NOT DETECTED NOT DETECTED Final    Comment: (NOTE) The Coronavirus on the Respiratory Panel, DOES NOT test for the novel  Coronavirus (2019 nCoV)    Coronavirus HKU1 NOT DETECTED NOT DETECTED Final   Coronavirus NL63 NOT DETECTED NOT DETECTED Final   Coronavirus OC43 NOT DETECTED NOT DETECTED Final   Metapneumovirus NOT DETECTED NOT DETECTED Final   Rhinovirus / Enterovirus NOT DETECTED NOT DETECTED Final   Influenza A NOT DETECTED NOT DETECTED Final   Influenza B NOT DETECTED NOT DETECTED Final   Parainfluenza Virus 1 NOT DETECTED NOT DETECTED Final   Parainfluenza Virus 2 NOT DETECTED NOT DETECTED Final   Parainfluenza Virus 3 NOT DETECTED NOT DETECTED Final   Parainfluenza Virus 4 NOT DETECTED NOT DETECTED Final    Respiratory Syncytial Virus NOT DETECTED NOT DETECTED Final   Bordetella pertussis NOT DETECTED NOT DETECTED Final   Bordetella Parapertussis NOT DETECTED NOT DETECTED Final   Chlamydophila pneumoniae NOT DETECTED NOT DETECTED Final   Mycoplasma pneumoniae NOT DETECTED NOT DETECTED Final    Comment: Performed at [facility], [address] [place], [place] [address]  MRSA Next Gen by PCR, Nasal     Status: None   Collection Time: 08/11/21 12:30 PM   Specimen: Nasal Mucosa; Nasal Swab  Result Value Ref Range Status   MRSA by PCR Next Gen NOT DETECTED NOT DETECTED Final    Comment: (NOTE) The GeneXpert MRSA Assay (FDA approved for NASAL specimens only), is one component of a comprehensive MRSA colonization surveillance program. It is not intended to diagnose MRSA infection nor to guide or monitor treatment for MRSA infections. Test performance is not FDA approved in patients less than 1 years old. Performed at [facility], [address] [place], [place] [address]   Resp Panel by RT-PCR (Flu A&B, Covid) Nasopharyngeal Swab     Status: None   Collection Time: 08/15/21  3:42 PM   Specimen: Nasopharyngeal Swab; Nasopharyngeal(NP) swabs in vial transport medium  Result Value Ref Range Status   SARS Coronavirus 2 by RT PCR NEGATIVE NEGATIVE Final    Comment: (NOTE) SARS-CoV-2 target nucleic acids are NOT DETECTED.  The SARS-CoV-2 RNA is generally detectable in upper respiratory specimens during the acute phase of infection. The lowest concentration of SARS-CoV-2 viral copies this assay can detect is 138 copies/mL. A negative result does not preclude SARS-Cov-2 infection and should not be used as the sole basis for treatment or other patient management decisions. A negative result may occur with  improper specimen collection/handling, submission of specimen other than nasopharyngeal swab, presence of viral mutation(s) within the areas targeted by this assay, and  inadequate number of viral copies(<138 copies/mL). A negative result must be combined with clinical observations, patient history, and epidemiological information. The expected result is Negative.  Fact Sheet for Patients:  BloggerCourse.com  Fact Sheet for Healthcare Providers:  SeriousBroker.it  This test is no t yet approved or cleared by the [place] FDA and  has been authorized for detection and/or diagnosis of SARS-CoV-2 by FDA under an Emergency Use Authorization (EUA). This EUA will remain  in effect (meaning this test can be used) for the duration of the COVID-19 declaration under Section 564(b)(1) of the Act, 21 U.S.C.section 360bbb-3(b)(1), unless the authorization is terminated  or revoked sooner.       Influenza A by PCR NEGATIVE NEGATIVE Final   Influenza B by PCR NEGATIVE NEGATIVE Final    Comment: (NOTE) The Xpert Xpress SARS-CoV-2/FLU/RSV plus assay is intended as an aid in the diagnosis of influenza from Nasopharyngeal swab specimens and should not be used as a sole basis for treatment. Nasal washings and aspirates are unacceptable for Xpert Xpress SARS-CoV-2/FLU/RSV testing.  Fact Sheet for Patients: BloggerCourse.com  Fact Sheet for Healthcare Providers: SeriousBroker.it  This test is not yet approved or cleared by the [place] FDA and has been authorized for detection and/or diagnosis of SARS-CoV-2 by FDA under an Emergency Use Authorization (EUA). This EUA will remain in effect (meaning this test can be used) for the duration of the COVID-19 declaration under Section 564(b)(1) of the Act, 21 U.S.C. section 360bbb-3(b)(1), unless the authorization is terminated or revoked.  Performed at [facility], [address] [place], [place] [address]   Blood Culture (routine x 2)     Status: None   Collection Time: 08/15/21  3:42 PM    Specimen: BLOOD  Result Value Ref Range Status   Specimen Description BLOOD RIGHT [facility]  Final   Special Requests   Final    BOTTLES DRAWN AEROBIC AND ANAEROBIC Blood Culture adequate volume   Culture   Final    NO GROWTH 5 DAYS Performed at [facility], [address] [place], [place] [address]    Report Status 08/20/2021 FINAL  Final  Blood Culture (routine x 2)     Status: None   Collection Time: 08/15/21  3:42 PM   Specimen: BLOOD  Result Value Ref Range Status   Specimen Description BLOOD RIGHT WRIST  Final   Special Requests   Final    BOTTLES DRAWN AEROBIC AND ANAEROBIC Blood Culture adequate volume   Culture   Final    NO GROWTH 5 DAYS Performed at [facility], [address] [place], [place] [address]    Report Status 08/20/2021 FINAL  Final  Respiratory (~20 pathogens) panel by PCR     Status: None   Collection Time: 08/15/21  7:40 PM   Specimen: Nasopharyngeal Swab; Respiratory  Result Value Ref Range Status   Adenovirus NOT DETECTED NOT DETECTED Final   Coronavirus 229E NOT DETECTED NOT DETECTED Final    Comment: (NOTE)  The Coronavirus on the Respiratory Panel, DOES NOT test for the novel  Coronavirus (2019 nCoV)    Coronavirus HKU1 NOT DETECTED NOT DETECTED Final   Coronavirus NL63 NOT DETECTED NOT DETECTED Final   Coronavirus OC43 NOT DETECTED NOT DETECTED Final   Metapneumovirus NOT DETECTED NOT DETECTED Final   Rhinovirus / Enterovirus NOT DETECTED NOT DETECTED Final   Influenza A NOT DETECTED NOT DETECTED Final   Influenza B NOT DETECTED NOT DETECTED Final   Parainfluenza Virus 1 NOT DETECTED NOT DETECTED Final   Parainfluenza Virus 2 NOT DETECTED NOT DETECTED Final   Parainfluenza Virus 3 NOT DETECTED NOT DETECTED Final   Parainfluenza Virus 4 NOT DETECTED NOT DETECTED Final   Respiratory Syncytial Virus NOT DETECTED NOT DETECTED Final   Bordetella pertussis NOT DETECTED NOT DETECTED Final   Bordetella Parapertussis NOT DETECTED NOT  DETECTED Final   Chlamydophila pneumoniae NOT DETECTED NOT DETECTED Final   Mycoplasma pneumoniae NOT DETECTED NOT DETECTED Final    Comment: Performed at [facility], [address] [place], [place] [address]  CULTURE, BLOOD (ROUTINE X 2) w Reflex to ID Panel     Status: None   Collection Time: 08/15/21  7:47 PM   Specimen: BLOOD  Result Value Ref Range Status   Specimen Description BLOOD BLOOD LEFT HAND  Final   Special Requests   Final    BOTTLES DRAWN AEROBIC ONLY Blood Culture results may not be optimal due to an inadequate volume of blood received in culture bottles   Culture   Final    NO GROWTH 5 DAYS Performed at [facility], [address] [place], [place] [address]    Report Status 08/20/2021 FINAL  Final  CULTURE, BLOOD (ROUTINE X 2) w Reflex to ID Panel     Status: None   Collection Time: 08/15/21  7:47 PM   Specimen: BLOOD  Result Value Ref Range Status   Specimen Description BLOOD BLOOD LEFT HAND  Final   Special Requests   Final    BOTTLES DRAWN AEROBIC ONLY Blood Culture results may not be optimal due to an inadequate volume of blood received in culture bottles   Culture   Final    NO GROWTH 5 DAYS Performed at [facility], [address] [place], [place] [address]    Report Status 08/20/2021 FINAL  Final  MRSA Next Gen by PCR, Nasal     Status: None   Collection Time: 08/15/21  9:20 PM   Specimen: Nasal Mucosa; Nasal Swab  Result Value Ref Range Status   MRSA by PCR Next Gen NOT DETECTED NOT DETECTED Final    Comment: (NOTE) The GeneXpert MRSA Assay (FDA approved for NASAL specimens only), is one component of a comprehensive MRSA colonization surveillance program. It is not intended to diagnose MRSA infection nor to guide or monitor treatment for MRSA infections. Test performance is not FDA approved in patients less than 73 years old. Performed at [facility], [address] [place], [place] [address]      Procedures and diagnostic studies:  No results found.             LOS: 5 days   Callyn Severtson  [facility]   Pager on [email or web address]. If 7PM-7AM, please contact night-coverage at [email or web address]     08/20/2021, 12:27 PM

## 2021-08-20 NOTE — Progress Notes (Signed)
PULMONOLOGY PROGRESS NOTE    Name: Bethany Villa MRN: [id number] DOB: Nov 01, 1955     LOS: 5   SUBJECTIVE FINDINGS & SIGNIFICANT EVENTS    Patient description:  66 yo patient with hx of bedbugs, COPD, lifelong smoking, heart failure came in from facility due to worsening doe/sob cough with s/s of AECOPD and flu like illness.   08/20/21- patient is now down to 1.5l/min [place] supplemental O2.  She reports rib pain on right, cxr shows no fractures but mild edema. She has been refursing PT/OT and we discussed this is going to hinder her progress to discharge   Lines/tubes :   Microbiology/Sepsis markers: Results for orders placed or performed during the hospital encounter of 08/15/21  Resp Panel by RT-PCR (Flu A&B, Covid) Nasopharyngeal Swab     Status: None   Collection Time: 08/15/21  3:42 PM   Specimen: Nasopharyngeal Swab; Nasopharyngeal(NP) swabs in vial transport medium  Result Value Ref Range Status   SARS Coronavirus 2 by RT PCR NEGATIVE NEGATIVE Final    Comment: (NOTE) SARS-CoV-2 target nucleic acids are NOT DETECTED.  The SARS-CoV-2 RNA is generally detectable in upper respiratory specimens during the acute phase of infection. The lowest concentration of SARS-CoV-2 viral copies this assay can detect is 138 copies/mL. A negative result does not preclude SARS-Cov-2 infection and should not be used as the sole basis for treatment or other patient management decisions. A negative result may occur with  improper specimen collection/handling, submission of specimen other than nasopharyngeal swab, presence of viral mutation(s) within the areas targeted by this assay, and inadequate number of viral copies(<138 copies/mL). A negative result must be combined with clinical observations, patient history, and  epidemiological information. The expected result is Negative.  Fact Sheet for Patients:  BloggerCourse.com  Fact Sheet for Healthcare Providers:  SeriousBroker.it  This test is no t yet approved or cleared by the [place] FDA and  has been authorized for detection and/or diagnosis of SARS-CoV-2 by FDA under an Emergency Use Authorization (EUA). This EUA will remain  in effect (meaning this test can be used) for the duration of the COVID-19 declaration under Section 564(b)(1) of the Act, 21 U.S.C.section 360bbb-3(b)(1), unless the authorization is terminated  or revoked sooner.       Influenza A by PCR NEGATIVE NEGATIVE Final   Influenza B by PCR NEGATIVE NEGATIVE Final    Comment: (NOTE) The Xpert Xpress SARS-CoV-2/FLU/RSV plus assay is intended as an aid in the diagnosis of influenza from Nasopharyngeal swab specimens and should not be used as a sole basis for treatment. Nasal washings and aspirates are unacceptable for Xpert Xpress SARS-CoV-2/FLU/RSV testing.  Fact Sheet for Patients: BloggerCourse.com  Fact Sheet for Healthcare Providers: SeriousBroker.it  This test is not yet approved or cleared by the [place] FDA and has been authorized for detection and/or diagnosis of SARS-CoV-2 by FDA under an Emergency Use Authorization (EUA). This EUA will remain in effect (meaning this test can be used) for the duration of the COVID-19 declaration under Section 564(b)(1) of the Act, 21 U.S.C. section 360bbb-3(b)(1), unless the authorization is terminated or revoked.  Performed at [facility], [address] [place], [place] [address]   Blood Culture (routine x 2)     Status: None   Collection Time: 08/15/21  3:42 PM   Specimen: BLOOD  Result Value Ref Range Status   Specimen Description BLOOD RIGHT [facility]  Final   Special Requests   Final    BOTTLES  DRAWN  AEROBIC AND ANAEROBIC Blood Culture adequate volume   Culture   Final    NO GROWTH 5 DAYS Performed at [facility], [address] [place], [place] [address]    Report Status 08/20/2021 FINAL  Final  Blood Culture (routine x 2)     Status: None   Collection Time: 08/15/21  3:42 PM   Specimen: BLOOD  Result Value Ref Range Status   Specimen Description BLOOD RIGHT WRIST  Final   Special Requests   Final    BOTTLES DRAWN AEROBIC AND ANAEROBIC Blood Culture adequate volume   Culture   Final    NO GROWTH 5 DAYS Performed at [facility], [address] [place], [place] [address]    Report Status 08/20/2021 FINAL  Final  Respiratory (~20 pathogens) panel by PCR     Status: None   Collection Time: 08/15/21  7:40 PM   Specimen: Nasopharyngeal Swab; Respiratory  Result Value Ref Range Status   Adenovirus NOT DETECTED NOT DETECTED Final   Coronavirus 229E NOT DETECTED NOT DETECTED Final    Comment: (NOTE) The Coronavirus on the Respiratory Panel, DOES NOT test for the novel  Coronavirus (2019 nCoV)    Coronavirus HKU1 NOT DETECTED NOT DETECTED Final   Coronavirus NL63 NOT DETECTED NOT DETECTED Final   Coronavirus OC43 NOT DETECTED NOT DETECTED Final   Metapneumovirus NOT DETECTED NOT DETECTED Final   Rhinovirus / Enterovirus NOT DETECTED NOT DETECTED Final   Influenza A NOT DETECTED NOT DETECTED Final   Influenza B NOT DETECTED NOT DETECTED Final   Parainfluenza Virus 1 NOT DETECTED NOT DETECTED Final   Parainfluenza Virus 2 NOT DETECTED NOT DETECTED Final   Parainfluenza Virus 3 NOT DETECTED NOT DETECTED Final   Parainfluenza Virus 4 NOT DETECTED NOT DETECTED Final   Respiratory Syncytial Virus NOT DETECTED NOT DETECTED Final   Bordetella pertussis NOT DETECTED NOT DETECTED Final   Bordetella Parapertussis NOT DETECTED NOT DETECTED Final   Chlamydophila pneumoniae NOT DETECTED NOT DETECTED Final   Mycoplasma pneumoniae NOT DETECTED NOT DETECTED Final     Comment: Performed at [facility], [place] [address], [place], [place] [address]  CULTURE, BLOOD (ROUTINE X 2) w Reflex to ID Panel     Status: None   Collection Time: 08/15/21  7:47 PM   Specimen: BLOOD  Result Value Ref Range Status   Specimen Description BLOOD BLOOD LEFT HAND  Final   Special Requests   Final    BOTTLES DRAWN AEROBIC ONLY Blood Culture results may not be optimal due to an inadequate volume of blood received in culture bottles   Culture   Final    NO GROWTH 5 DAYS Performed at [facility], [address] [place], [place] [address]    Report Status 08/20/2021 FINAL  Final  CULTURE, BLOOD (ROUTINE X 2) w Reflex to ID Panel     Status: None   Collection Time: 08/15/21  7:47 PM   Specimen: BLOOD  Result Value Ref Range Status   Specimen Description BLOOD BLOOD LEFT HAND  Final   Special Requests   Final    BOTTLES DRAWN AEROBIC ONLY Blood Culture results may not be optimal due to an inadequate volume of blood received in culture bottles   Culture   Final    NO GROWTH 5 DAYS Performed at [facility], [address] [place], [place] [address]    Report Status 08/20/2021 FINAL  Final  MRSA Next Gen  by PCR, Nasal     Status: None   Collection Time: 08/15/21  9:20 PM   Specimen: Nasal Mucosa; Nasal Swab  Result Value Ref Range Status   MRSA by PCR Next Gen NOT DETECTED NOT DETECTED Final    Comment: (NOTE) The GeneXpert MRSA Assay (FDA approved for NASAL specimens only), is one component of a comprehensive MRSA colonization surveillance program. It is not intended to diagnose MRSA infection nor to guide or monitor treatment for MRSA infections. Test performance is not FDA approved in patients less than 34 years old. Performed at [facility], [address], [place], [place] [address]     Anti-infectives:  Anti-infectives (From admission, onward)    Start     Dose/Rate Route Frequency Ordered Stop   08/16/21 1630   azithromycin (ZITHROMAX) 500 mg in sodium chloride 0.9 % 250 mL IVPB  Status:  Discontinued        500 mg 250 mL/hr over 60 Minutes Intravenous Every 24 hours 08/16/21 0237 08/16/21 1350   08/16/21 1445  azithromycin (ZITHROMAX) tablet 500 mg        500 mg Oral Daily 08/16/21 1351 08/20/21 1025   08/15/21 1545  vancomycin (VANCOCIN) IVPB 1000 mg/200 mL premix  Status:  Discontinued        1,000 mg 200 mL/hr over 60 Minutes Intravenous  Once 08/15/21 1542 08/15/21 1548   08/15/21 1545  ceFEPIme (MAXIPIME) 2 g in sodium chloride 0.9 % 100 mL IVPB        2 g 200 mL/hr over 30 Minutes Intravenous  Once 08/15/21 1542 08/15/21 1648   08/15/21 1545  azithromycin (ZITHROMAX) 500 mg in sodium chloride 0.9 % 250 mL IVPB        500 mg 250 mL/hr over 60 Minutes Intravenous  Once 08/15/21 1542 08/15/21 1732        PAST MEDICAL HISTORY   Past Medical History:  Diagnosis Date   Anxiety    Asthma    Chronic pain    lower back   Chronic systolic CHF (congestive heart failure) ([place]) 09/22/2019   COPD (chronic obstructive pulmonary disease) (HCC)    Depression    DJD (degenerative joint disease)    Fibromyalgia    GERD (gastroesophageal reflux disease)    Headache    Hyperlipidemia    Lower extremity edema    On home oxygen therapy    has not been on since 2016   Panic attack    Shortness of breath dyspnea    Sleep disorder      SURGICAL HISTORY   Past Surgical History:  Procedure Laterality Date   ABDOMINAL SURGERY     ANKLE SURGERY     BACK SURGERY     lower   CARPAL TUNNEL RELEASE     CESAREAN SECTION     I & D KNEE WITH POLY EXCHANGE N/A 06/11/2020   Procedure: IRRIGATION AND DEBRIDEMENT KNEE WITH POLY EXCHANGE;  Surgeon: Corky Mull, MD;  Location: [facility] ORS;  Service: Orthopedics;  Laterality: N/A;   KNEE SURGERY     TONSILLECTOMY     TOTAL KNEE ARTHROPLASTY Left 04/14/2016   Procedure: TOTAL KNEE ARTHROPLASTY;  Surgeon: Earnestine Leys, MD;  Location: [facility] ORS;  Service:  Orthopedics;  Laterality: Left;   TUBAL LIGATION       FAMILY HISTORY   Family History  Problem Relation Age of Onset   Cancer Mother        beast  SOCIAL HISTORY   Social History   Tobacco Use   Smoking status: Every Day    Packs/day: 1.00    Years: 45.00    Pack years: 45.00    Types: Cigarettes   Smokeless tobacco: Never  Vaping Use   Vaping Use: Never used  Substance Use Topics   Alcohol use: Yes    Alcohol/week: 0.0 standard drinks    Comment: rare   Drug use: No     MEDICATIONS   Current Medication:  Current Facility-Administered Medications:    0.9 %  sodium chloride infusion, 250 mL, Intravenous, Continuous, Ouma, Bing Neighbors, NP, Held at 08/15/21 1952   albuterol (PROVENTIL) (2.5 MG/3ML) 0.083% nebulizer solution 2.5 mg, 2.5 mg, Nebulization, Q2H PRN, Priscella Mann, Sudheer B, MD   arformoterol (BROVANA) nebulizer solution 15 mcg, 15 mcg, Nebulization, BID, Priscella Mann, Sudheer B, MD, 15 mcg at 08/20/21 0738   budesonide (PULMICORT) nebulizer solution 0.25 mg, 0.25 mg, Nebulization, BID, Ouma, Bing Neighbors, NP, 0.25 mg at 08/20/21 [id number]   clopidogrel (PLAVIX) tablet 75 mg, 75 mg, Oral, Daily, Sreenath, Sudheer B, MD, 75 mg at 08/20/21 1025   docusate sodium (COLACE) capsule 100 mg, 100 mg, Oral, BID PRN, Ottie Glazier, MD   enoxaparin (LOVENOX) injection 40 mg, 40 mg, Subcutaneous, QHS, Teagan Heidrick, MD, 40 mg at 08/19/21 2041   guaiFENesin-dextromethorphan (ROBITUSSIN DM) 100-10 MG/5ML syrup 5 mL, 5 mL, Oral, Q4H PRN, Priscella Mann, Sudheer B, MD, 5 mL at 08/20/21 1634   HYDROmorphone (DILAUDID) injection 0.5 mg, 0.5 mg, Intravenous, Q4H PRN, Jennye Boroughs, MD, 0.5 mg at 08/20/21 1443   ipratropium-albuterol (DUONEB) 0.5-2.5 (3) MG/3ML nebulizer solution 3 mL, 3 mL, Nebulization, BID, Jennye Boroughs, MD, 3 mL at 08/20/21 0737   lactulose (CHRONULAC) 10 GM/15ML solution 20 g, 20 g, Oral, BID, Sreenath, Sudheer B, MD, 20 g at 08/17/21 1034   lidocaine  (LIDODERM) 5 % 1 patch, 1 patch, Transdermal, Q24H, Sreenath, Sudheer B, MD, 1 patch at 08/18/21 0831   losartan (COZAAR) tablet 50 mg, 50 mg, Oral, Daily, Sreenath, Sudheer B, MD, 50 mg at 08/20/21 1025   melatonin tablet 2.5 mg, 2.5 mg, Oral, QHS, Tonye Royalty, NP, 2.5 mg at 08/19/21 2039   methocarbamol (ROBAXIN) tablet 750 mg, 750 mg, Oral, TID, Priscella Mann, Sudheer B, MD, 750 mg at 08/20/21 1634   oxyCODONE (Oxy IR/ROXICODONE) immediate release tablet 5 mg, 5 mg, Oral, Q4H PRN, Priscella Mann, Sudheer B, MD, 5 mg at 08/20/21 1702   pantoprazole (PROTONIX) EC tablet 40 mg, 40 mg, Oral, Daily, Priscella Mann, Sudheer B, MD, 40 mg at 08/20/21 1024   polyethylene glycol (MIRALAX / GLYCOLAX) packet 17 g, 17 g, Oral, Daily PRN, Ottie Glazier, MD    ALLERGIES   Gabapentin, Mobic [meloxicam], Penicillins, and Sulfa antibiotics    REVIEW OF SYSTEMS     10 point ros done and is negative except generalized pain   PHYSICAL EXAMINATION   Vital Signs: Temp:  [97.4 F (36.3 C)-98.3 F (36.8 C)] 98.1 F (36.7 C) (12/22 1500) Pulse Rate:  [73-85] 80 (12/22 1500) Resp:  [18-20] 18 (12/22 1500) BP: (95-127)/(54-84) 95/54 (12/22 1500) SpO2:  [94 %-100 %] 95 % (12/22 1500)  GENERAL:Age appropriate mild distress HEAD: Normocephalic, atraumatic.  EYES: Pupils equal, round, reactive to light.  No scleral icterus.  MOUTH: Moist mucosal membrane. NECK: Supple. No thyromegaly. No nodules. No JVD.  PULMONARY: mild crackles CARDIOVASCULAR: S1 and S2. Regular rate and rhythm. No murmurs, rubs, or gallops.  GASTROINTESTINAL: Soft, nontender, non-distended. No masses.  Positive bowel sounds. No hepatosplenomegaly.  MUSCULOSKELETAL: No swelling, clubbing, or edema.  NEUROLOGIC: Mild distress due to acute illness SKIN:intact,warm,dry   PERTINENT DATA     Infusions:  sodium chloride Stopped (08/15/21 1952)   Scheduled Medications:  arformoterol  15 mcg Nebulization BID   budesonide (PULMICORT)  nebulizer solution  0.25 mg Nebulization BID   clopidogrel  75 mg Oral Daily   enoxaparin (LOVENOX) injection  40 mg Subcutaneous QHS   ipratropium-albuterol  3 mL Nebulization BID   lactulose  20 g Oral BID   lidocaine  1 patch Transdermal Q24H   losartan  50 mg Oral Daily   melatonin  2.5 mg Oral QHS   methocarbamol  750 mg Oral TID   pantoprazole  40 mg Oral Daily   PRN Medications: albuterol, docusate sodium, guaiFENesin-dextromethorphan, HYDROmorphone (DILAUDID) injection, oxyCODONE, polyethylene glycol Hemodynamic parameters:   Intake/Output: 12/21 0701 - 12/22 0700 In: 720 [P.O.:720] Out: 2200 [Urine:2200]  Ventilator  Settings:    LAB RESULTS:  Basic Metabolic Panel: Recent Labs  Lab 08/15/21 1542 08/15/21 1947 08/16/21 0411  NA 136  --  134*  K 4.3  --  4.7  CL 99  --  102  CO2 32  --  28  GLUCOSE 114*  --  138*  BUN 30*  --  26*  CREATININE 1.27*  --  0.76  CALCIUM 7.9*  --  8.1*  MG  --  2.1  --     Liver Function Tests: Recent Labs  Lab 08/15/21 1542  AST 51*  ALT 35  ALKPHOS 57  BILITOT 0.8  PROT 6.6  ALBUMIN 3.2*    No results for input(s): LIPASE, AMYLASE in the last 168 hours. Recent Labs  Lab 08/15/21 1710  AMMONIA 33    CBC: Recent Labs  Lab 08/15/21 1542 08/16/21 0411  WBC 20.2* 12.5*  NEUTROABS 13.9* 11.4*  HGB 12.7 11.4*  HCT 42.4 37.9  MCV 99.1 95.7  PLT 170 141*    Cardiac Enzymes: No results for input(s): CKTOTAL, CKMB, CKMBINDEX, TROPONINI in the last 168 hours. BNP: Invalid input(s): POCBNP CBG: Recent Labs  Lab 08/17/21 0324 08/17/21 0809 08/17/21 1128 08/17/21 1535 08/17/21 1936  GLUCAP 89 104* 163* 181* 132*        IMAGING RESULTS:  Imaging: No results found. @PROBHOSP @ No results found.      ASSESSMENT AND PLAN    -Multidisciplinary rounds held today  Acute Hypoxic Hypercapnic Respiratory Failure   -due to  severe COPD exacerbation   - RVP- negative  -continue supplemental O2  spO2 goal 90% currently on 1.5l/min -continue Bronchodilator Therapy -off vasopressors  Circulatory shock-RESOLVED   - possible cardiac vs septic   - blood cultures  - lactate  - crp  -on levophed currently   - CXR with edema caution with fluids   Acute on chronic diastilic CHF iBNP 0000000 CXR with pulm edema -oxygen as needed -Lasix as tolerated -follow up cardiac biomarkers ICU monitoring  Renal Failure-most likely due to ATN-RESOLVED -follow chem 7 -follow UO -continue Foley Catheter-assess need daily Dc nephrotoxins  Bed bugs  - hygiene precautions    ID -continue IV abx as prescibed -follow up cultures  GI/Nutrition GI PROPHYLAXIS as indicated DIET-->TF's as tolerated Constipation protocol as indicated  ENDO - ICU hypoglycemic\Hyperglycemia protocol -check FSBS per protocol   ELECTROLYTES -follow labs as needed -replace as needed -pharmacy consultation   DVT/GI PRX ordered -SCDs  TRANSFUSIONS AS NEEDED MONITOR FSBS ASSESS the need for  LABS as needed   Critical care provider statement:   Total critical care time: 33 minutes   Performed by: Lanney Gins MD   Critical care time was exclusive of separately billable procedures and treating other patients.   Critical care was necessary to treat or prevent imminent or life-threatening deterioration.   Critical care was time spent personally by me on the following activities: development of treatment plan with patient and/or surrogate as well as nursing, discussions with consultants, evaluation of patient's response to treatment, examination of patient, obtaining history from patient or surrogate, ordering and performing treatments and interventions, ordering and review of laboratory studies, ordering and review of radiographic studies, pulse oximetry and re-evaluation of patient's condition.    Ottie Glazier, M.D.  Pulmonary & Critical Care Medicine       Ottie Glazier, M.D.  Division of  [place]

## 2021-08-20 NOTE — TOC Progression Note (Signed)
Transition of Care [facility]) - Progression Note    Patient Details  Name: [name] MRN: [id number] Date of Birth: Dec 30, 1955  Transition of Care [facility]) CM/SW Contact  Chapman Fitch, RN Phone Number: 08/20/2021, 9:52 AM  Clinical Narrative:    Presented bed offers to patient.  She accepted bed at [facility].  Accepted bed in HUB and notified Debra at [facility]   Expected Discharge Plan: Skilled Nursing Facility Barriers to Discharge: Continued Medical Work up  Expected Discharge Plan and Services Expected Discharge Plan: Skilled Nursing Facility       Living arrangements for the past 2 months: Single Family Home                                       Social Determinants of Health (SDOH) Interventions    Readmission Risk Interventions Readmission Risk Prevention Plan 08/18/2021  Transportation Screening Complete  HRI or Home Care Consult Complete  Social Work Consult for Recovery Care Planning/Counseling Complete  Palliative Care Screening Not Applicable  Medication Review Oceanographer) Complete  Some recent data might be hidden

## 2021-08-21 ENCOUNTER — Inpatient Hospital Stay: Payer: Medicare Other

## 2021-08-21 LAB — BASIC METABOLIC PANEL
Anion gap: 5 (ref 5–15)
BUN: 25 mg/dL — ABNORMAL HIGH (ref 8–23)
CO2: 36 mmol/L — ABNORMAL HIGH (ref 22–32)
Calcium: 8 mg/dL — ABNORMAL LOW (ref 8.9–10.3)
Chloride: 95 mmol/L — ABNORMAL LOW (ref 98–111)
Creatinine, Ser: 0.74 mg/dL (ref 0.44–1.00)
GFR, Estimated: >60 mL/min (ref >60)
Glucose, Bld: 94 mg/dL (ref 70–99)
Potassium: 4.3 mmol/L (ref 3.5–5.1)
Sodium: 136 mmol/L (ref 135–145)

## 2021-08-21 MED ORDER — ACETAMINOPHEN 325 MG PO TABS
650.0000 mg | ORAL_TABLET | Freq: Four times a day (QID) | ORAL | Status: DC | PRN
  Administered 2021-08-22 – 2021-08-25 (×9): 650 mg via ORAL
  Filled 2021-08-21 (×10): qty 2

## 2021-08-21 MED ORDER — ACETAMINOPHEN 325 MG PO TABS
650.0000 mg | ORAL_TABLET | Freq: Four times a day (QID) | ORAL | Status: AC
  Administered 2021-08-21 (×2): 650 mg via ORAL
  Filled 2021-08-21 (×2): qty 2

## 2021-08-21 NOTE — Progress Notes (Signed)
Physical Therapy Treatment Patient Details Name: Bethany Villa MRN: [id number] DOB: 10-01-1955 Today's Date: 08/21/2021   History of Present Illness Pt is a 65 y.o. female presenting to hospital 12/17 with AMS and respiratory distress; also hypotensive and pain all over.  Recent hospitalization for PNA.  Pt admitted with acute exacerbation of COPD, acute hypoxic and hypercarbic respiratory failure, circulatory shock, acute on chronic diastolic CHF, AKI, and bed bugs.  PMH includes COPD, CHF, anxiety, chronic LBP, fibromyalgia, panic attack, sleep disorder, ankle sx, low back sx, CTR, knee sx L, h/o bedbugs.    PT Comments    Patient tolerated session well and was agreeable to treatment. Pain ranged from 7-8/10 throughout session, however patient was pre-medicated prior to session. Tolerance to activity improved today. Patient was able to demonstrate bed mobility Mod I with increased time and effort, and sit<>stands at CGA-SBA. 3 minute was max patient was able to tolerate standing with BUE support on RW and CGA. Patient would continue to benefit from skilled physical therapy in order to optimize return to PLOF. Recommendation of SNF following d/c from acute hospitalization still stands.   Recommendations for follow up therapy are one component of a multi-disciplinary discharge planning process, led by the attending physician.  Recommendations may be updated based on patient status, additional functional criteria and insurance authorization.  Follow Up Recommendations  Skilled nursing-short term rehab (<3 hours/day)     Assistance Recommended at Discharge Frequent or constant Supervision/Assistance  Equipment Recommendations  Rolling walker (2 wheels);BSC/3in1    Recommendations for Other Services       Precautions / Restrictions Precautions Precautions: Fall Restrictions Weight Bearing Restrictions: No     Mobility  Bed Mobility Overal bed mobility: Needs Assistance   Rolling:  Supervision   Supine to sit: Modified independent (Device/Increase time) (use of UE from bed rail) Sit to supine: Modified independent (Device/Increase time)        Transfers Overall transfer level: Needs assistance Equipment used: Rolling walker (2 wheels) Transfers: Sit to/from Stand Sit to Stand: Min guard;Supervision           General transfer comment: VCing on proper hand placement when coming to standing with RW    Ambulation/Gait                   Stairs             Wheelchair Mobility    Modified Rankin (Stroke Patients Only)       Balance                                            Cognition Arousal/Alertness: Awake/alert Behavior During Therapy: Anxious                                            Exercises General Exercises - Lower Extremity Ankle Circles/Pumps: Supine;AROM;20 reps;Both Hip Flexion/Marching: Seated;AROM;10 reps;Right;Left Other Exercises Other Exercises: x3 sit to stands; CGA-SBA with RW; patient reported increased calf pain upon standing that went away upon sitting Other Exercises: education on safety/ hand placements when performing sit to stands with RW Other Exercises: 2x30 second stand, x3 minute stand; BUE support from RW; completed CGA    General Comments        Pertinent Vitals/Pain  Pain Assessment: 0-10 Pain Score: 7  (Pain was reported at a 7/10 a start of session, at conclusion of session pain was reported as 8/10) Pain Location: R ribs Pain Descriptors / Indicators: Aching;Sore;Tender Pain Intervention(s): Limited activity within patient's tolerance;Monitored during session;Repositioned;RN gave pain meds during session    Home Living                          Prior Function            PT Goals (current goals can now be found in the care plan section) Acute Rehab PT Goals Patient Stated Goal: to improve pain PT Goal Formulation: With patient Time For  Goal Achievement: 09/01/21 Potential to Achieve Goals: Fair Progress towards PT goals: Progressing toward goals    Frequency    Min 2X/week      PT Plan Current plan remains appropriate    Co-evaluation              AM-PAC PT "6 Clicks" Mobility   Outcome Measure  Help needed turning from your back to your side while in a flat bed without using bedrails?: A Little Help needed moving from lying on your back to sitting on the side of a flat bed without using bedrails?: A Little Help needed moving to and from a bed to a chair (including a wheelchair)?: A Lot Help needed standing up from a chair using your arms (e.g., wheelchair or bedside chair)?: A Little Help needed to walk in hospital room?: A Lot Help needed climbing 3-5 steps with a railing? : Total 6 Click Score: 14    End of Session Equipment Utilized During Treatment: Oxygen;Gait belt Activity Tolerance: Patient limited by pain Patient left: in bed;with call bell/phone within reach;with bed alarm set Nurse Communication: Mobility status;Patient requests pain meds PT Visit Diagnosis: Other abnormalities of gait and mobility (R26.89);Muscle weakness (generalized) (M62.81);Pain Pain - Right/Left: Right     Time: 1242-1300 PT Time Calculation (min) (ACUTE ONLY): 18 min  Charges:  $Therapeutic Activity: 8-22 mins                     Angelica Ran, PT  08/21/21. 1:18 PM

## 2021-08-21 NOTE — Care Management Important Message (Signed)
Important Message  Patient Details  Name: Bethany Villa MRN: [id number] Date of Birth: 12/21/1955   Medicare Important Message Given:  Yes     Olegario Messier A Luwanna Brossman 08/21/2021, 2:09 PM

## 2021-08-21 NOTE — Plan of Care (Signed)
°  Problem: Clinical Measurements: Goal: Ability to maintain clinical measurements within normal limits will improve Outcome: Progressing Goal: Will remain free from infection Outcome: Progressing Goal: Diagnostic test results will improve Outcome: Progressing Goal: Respiratory complications will improve Outcome: Progressing Goal: Cardiovascular complication will be avoided Outcome: Progressing   Problem: Pain Managment: Goal: General experience of comfort will improve Outcome: Progressing   Pt is alert and orientedx4. V/S stable. Complained of pain on her back and ribcage (right) - Diluadid IV and oxycodone given. On chronic oxygen at 2Lpm/[place] with oxygen sat at 96-97%.

## 2021-08-21 NOTE — Discharge Summary (Incomplete)
Physician Discharge Summary  Bethany Villa DTO:[id number] DOB: [birth year] DOA: 08/15/2021  PCP: [name], MD  Admit date: 08/15/2021 Discharge date: 08/21/2021  Discharge disposition: SNF   Recommendations for Outpatient Follow-Up:   Follow-up with PCP as an outpatient for routine health maintenance. Schedule an appointment to go to the pain clinic for long-term pain management Follow-up with physician at the nursing home within 3 days of discharge   Discharge Diagnosis:   Principal Problem:   Acute exacerbation of COPD with asthma (HCC)    Discharge Condition: Stable.  Diet recommendation:  Diet Order             Diet Heart Room service appropriate? Yes; Fluid consistency: Thin  Diet effective now                     Code Status: Full Code     Hospital Course:   [name] is a 65 y.o. female with medical history significant for bedbugs infestation, COPD, chronic hypoxemic respiratory failure on 2 L/min oxygen at night, chronic diastolic CHF, suspected liver cirrhosis, tobacco use disorder, recent discharge from the hospital on 08/13/2021 for altered mental status, pneumonia, COPD exacerbation, rib fracture and respiratory failure.  She presented to the hospital with cough, shortness of breath, wheezing and lethargy.   She was found to have COPD exacerbation, acute on chronic diastolic CHF and circulatory shock requiring IV fluids and Levophed.  She was treated with IV steroids, bronchodilators and empiric IV antibiotics.  She also received IV Lasix for CHF exacerbation.  She was initially treated with BiPAP for acute hypoxemic and hypercapnic respiratory failure.  She was weaned off of BiPAP to test per minute oxygen via nasal cannula.  She was evaluated by PT and OT who recommended further rehabilitation at a skilled nursing facility.   Medical Consultants:   Intensivist   Discharge Exam:    Vitals:   08/20/21 2147 08/21/21 0354  08/21/21 0500 08/21/21 0908  BP:  113/79  107/69  Pulse:  78  95  Resp:  20  16  Temp:  98.3 F (36.8 C)  98.2 F (36.8 C)  TempSrc:  Oral  Oral  SpO2: 95% 97%  97%  Weight:   89.4 kg   Height:         GEN: NAD SKIN: Warm and dry EYES: No pallor or icterus ENT: MMM CV: RRR PULM: CTA B ABD: soft, ND, NT, +BS CNS: AAO x 3, non focal EXT: No edema or tenderness   The results of significant diagnostics from this hospitalization (including imaging, microbiology, ancillary and laboratory) are listed below for reference.     Procedures and Diagnostic Studies:   DG Chest Portable 1 View  Result Date: 08/15/2021 CLINICAL DATA:  Respiratory distress. EXAM: PORTABLE CHEST 1 VIEW COMPARISON:  08/12/2021 and older exams. FINDINGS: Cardiac silhouette is normal in size. No mediastinal or hilar masses. Lungs demonstrate vascular interstitial prominence. There are no areas of lung consolidation. No convincing pleural effusion and no pneumothorax. Skeletal structures are grossly intact. IMPRESSION: 1. No convincing acute cardiopulmonary disease. Electronically Signed   By: Amie Portland M.D.   On: 08/15/2021 16:06     Labs:   Basic Metabolic Panel: Recent Labs  Lab 08/15/21 1542 08/15/21 1947 08/16/21 0411 08/21/21 0451  NA 136  --  134* 136  K 4.3  --  4.7 4.3  CL 99  --  102 95*  CO2 32  --  28 36*  GLUCOSE 114*  --  138* 94  BUN 30*  --  26* 25*  CREATININE 1.27*  --  0.76 0.74  CALCIUM 7.9*  --  8.1* 8.0*  MG  --  2.1  --   --    GFR Estimated Creatinine Clearance: 76.9 mL/min (by C-G formula based on SCr of 0.74 mg/dL). Liver Function Tests: Recent Labs  Lab 08/15/21 1542  AST 51*  ALT 35  ALKPHOS 57  BILITOT 0.8  PROT 6.6  ALBUMIN 3.2*   No results for input(s): LIPASE, AMYLASE in the last 168 hours. Recent Labs  Lab 08/15/21 1710  AMMONIA 33   Coagulation profile Recent Labs  Lab 08/15/21 1542  INR 1.0    CBC: Recent Labs  Lab 08/15/21 1542  08/16/21 0411 08/20/21 1917  WBC 20.2* 12.5* 15.0*  NEUTROABS 13.9* 11.4* 8.2*  HGB 12.7 11.4* 12.6  HCT 42.4 37.9 40.3  MCV 99.1 95.7 92.0  PLT 170 141* 202   Cardiac Enzymes: No results for input(s): CKTOTAL, CKMB, CKMBINDEX, TROPONINI in the last 168 hours. BNP: Invalid input(s): POCBNP CBG: Recent Labs  Lab 08/17/21 0324 08/17/21 0809 08/17/21 1128 08/17/21 1535 08/17/21 1936  GLUCAP 89 104* 163* 181* 132*   D-Dimer No results for input(s): DDIMER in the last 72 hours. Hgb A1c No results for input(s): HGBA1C in the last 72 hours. Lipid Profile No results for input(s): CHOL, HDL, LDLCALC, TRIG, CHOLHDL, LDLDIRECT in the last 72 hours. Thyroid function studies No results for input(s): TSH, T4TOTAL, T3FREE, THYROIDAB in the last 72 hours.  Invalid input(s): FREET3 Anemia work up No results for input(s): VITAMINB12, FOLATE, FERRITIN, TIBC, IRON, RETICCTPCT in the last 72 hours. Microbiology Recent Results (from the past 240 hour(s))  Resp Panel by RT-PCR (Flu A&B, Covid) Nasopharyngeal Swab     Status: None   Collection Time: 08/15/21  3:42 PM   Specimen: Nasopharyngeal Swab; Nasopharyngeal(NP) swabs in vial transport medium  Result Value Ref Range Status   SARS Coronavirus 2 by RT PCR NEGATIVE NEGATIVE Final    Comment: (NOTE) SARS-CoV-2 target nucleic acids are NOT DETECTED.  The SARS-CoV-2 RNA is generally detectable in upper respiratory specimens during the acute phase of infection. The lowest concentration of SARS-CoV-2 viral copies this assay can detect is 138 copies/mL. A negative result does not preclude SARS-Cov-2 infection and should not be used as the sole basis for treatment or other patient management decisions. A negative result may occur with  improper specimen collection/handling, submission of specimen other than nasopharyngeal swab, presence of viral mutation(s) within the areas targeted by this assay, and inadequate number of viral copies(<138  copies/mL). A negative result must be combined with clinical observations, patient history, and epidemiological information. The expected result is Negative.  Fact Sheet for Patients:  BloggerCourse.com  Fact Sheet for Healthcare Providers:  SeriousBroker.it  This test is no t yet approved or cleared by the [place] FDA and  has been authorized for detection and/or diagnosis of SARS-CoV-2 by FDA under an Emergency Use Authorization (EUA). This EUA will remain  in effect (meaning this test can be used) for the duration of the COVID-19 declaration under Section 564(b)(1) of the Act, 21 U.S.C.section 360bbb-3(b)(1), unless the authorization is terminated  or revoked sooner.       Influenza A by PCR NEGATIVE NEGATIVE Final   Influenza B by PCR NEGATIVE NEGATIVE Final    Comment: (NOTE) The Xpert Xpress SARS-CoV-2/FLU/RSV plus assay is intended as an aid  in the diagnosis of influenza from Nasopharyngeal swab specimens and should not be used as a sole basis for treatment. Nasal washings and aspirates are unacceptable for Xpert Xpress SARS-CoV-2/FLU/RSV testing.  Fact Sheet for Patients: BloggerCourse.com  Fact Sheet for Healthcare Providers: SeriousBroker.it  This test is not yet approved or cleared by the [place] FDA and has been authorized for detection and/or diagnosis of SARS-CoV-2 by FDA under an Emergency Use Authorization (EUA). This EUA will remain in effect (meaning this test can be used) for the duration of the COVID-19 declaration under Section 564(b)(1) of the Act, 21 U.S.C. section 360bbb-3(b)(1), unless the authorization is terminated or revoked.  Performed at [facility], [address] [place], [place] [address]   Blood Culture (routine x 2)     Status: None   Collection Time: 08/15/21  3:42 PM   Specimen: BLOOD  Result Value Ref Range  Status   Specimen Description BLOOD RIGHT [facility]  Final   Special Requests   Final    BOTTLES DRAWN AEROBIC AND ANAEROBIC Blood Culture adequate volume   Culture   Final    NO GROWTH 5 DAYS Performed at [facility], [address] [place], [place] [address]    Report Status 08/20/2021 FINAL  Final  Blood Culture (routine x 2)     Status: None   Collection Time: 08/15/21  3:42 PM   Specimen: BLOOD  Result Value Ref Range Status   Specimen Description BLOOD RIGHT WRIST  Final   Special Requests   Final    BOTTLES DRAWN AEROBIC AND ANAEROBIC Blood Culture adequate volume   Culture   Final    NO GROWTH 5 DAYS Performed at [facility], [address] [place], [place] [address]    Report Status 08/20/2021 FINAL  Final  Respiratory (~20 pathogens) panel by PCR     Status: None   Collection Time: 08/15/21  7:40 PM   Specimen: Nasopharyngeal Swab; Respiratory  Result Value Ref Range Status   Adenovirus NOT DETECTED NOT DETECTED Final   Coronavirus 229E NOT DETECTED NOT DETECTED Final    Comment: (NOTE) The Coronavirus on the Respiratory Panel, DOES NOT test for the novel  Coronavirus (2019 nCoV)    Coronavirus HKU1 NOT DETECTED NOT DETECTED Final   Coronavirus NL63 NOT DETECTED NOT DETECTED Final   Coronavirus OC43 NOT DETECTED NOT DETECTED Final   Metapneumovirus NOT DETECTED NOT DETECTED Final   Rhinovirus / Enterovirus NOT DETECTED NOT DETECTED Final   Influenza A NOT DETECTED NOT DETECTED Final   Influenza B NOT DETECTED NOT DETECTED Final   Parainfluenza Virus 1 NOT DETECTED NOT DETECTED Final   Parainfluenza Virus 2 NOT DETECTED NOT DETECTED Final   Parainfluenza Virus 3 NOT DETECTED NOT DETECTED Final   Parainfluenza Virus 4 NOT DETECTED NOT DETECTED Final   Respiratory Syncytial Virus NOT DETECTED NOT DETECTED Final   Bordetella pertussis NOT DETECTED NOT DETECTED Final   Bordetella Parapertussis NOT DETECTED NOT DETECTED Final   Chlamydophila  pneumoniae NOT DETECTED NOT DETECTED Final   Mycoplasma pneumoniae NOT DETECTED NOT DETECTED Final    Comment: Performed at [facility], [address], [place], [place] [address]  CULTURE, BLOOD (ROUTINE X 2) w Reflex to ID Panel     Status: None   Collection Time: 08/15/21  7:47 PM   Specimen: BLOOD  Result Value Ref Range Status   Specimen Description BLOOD BLOOD LEFT HAND  Final   Special Requests  Final    BOTTLES DRAWN AEROBIC ONLY Blood Culture results may not be optimal due to an inadequate volume of blood received in culture bottles   Culture   Final    NO GROWTH 5 DAYS Performed at [facility], [address] [place], [place] [address]    Report Status 08/20/2021 FINAL  Final  CULTURE, BLOOD (ROUTINE X 2) w Reflex to ID Panel     Status: None   Collection Time: 08/15/21  7:47 PM   Specimen: BLOOD  Result Value Ref Range Status   Specimen Description BLOOD BLOOD LEFT HAND  Final   Special Requests   Final    BOTTLES DRAWN AEROBIC ONLY Blood Culture results may not be optimal due to an inadequate volume of blood received in culture bottles   Culture   Final    NO GROWTH 5 DAYS Performed at [facility], [address], [place], [place] [address]    Report Status 08/20/2021 FINAL  Final  MRSA Next Gen by PCR, Nasal     Status: None   Collection Time: 08/15/21  9:20 PM   Specimen: Nasal Mucosa; Nasal Swab  Result Value Ref Range Status   MRSA by PCR Next Gen NOT DETECTED NOT DETECTED Final    Comment: (NOTE) The GeneXpert MRSA Assay (FDA approved for NASAL specimens only), is one component of a comprehensive MRSA colonization surveillance program. It is not intended to diagnose MRSA infection nor to guide or monitor treatment for MRSA infections. Test performance is not FDA approved in patients less than 2 years old. Performed at [facility], [address] [place], [place] [address]      Discharge Instructions:     Allergies as of 08/21/2021       Reactions   Gabapentin Shortness Of Breath, Rash   Mobic [meloxicam] Anaphylaxis   Penicillins Shortness Of Breath, Swelling   Tolerated Zosyn December 2022 Has patient had a PCN reaction causing immediate rash, facial/tongue/throat swelling, SOB or lightheadedness with hypotension: unknown Has patient had a PCN reaction causing severe rash involving mucus membranes or skin necrosis: {unknown Has patient had a PCN reaction that required hospitalization {unknown Has patient had a PCN reaction occurring within the last 10 years: no If all of the above answers are "NO", then may proceed with Cephalosporin use.   Sulfa Antibiotics Shortness Of Breath, Swelling     Med Rec must be completed prior to using this SMARTLINK***       Contact information for after-discharge care     Destination     [facility] .   Service: Skilled Nursing Contact information: [address] [place] [address] [phone]                       If you experience worsening of your admission symptoms, develop shortness of breath, life threatening emergency, suicidal or homicidal thoughts you must seek medical attention immediately by calling 911 or calling your MD immediately  if symptoms less severe.   You must read complete instructions/literature along with all the possible adverse reactions/side effects for all the medicines you take and that have been prescribed to you. Take any new medicines after you have completely understood and accept all the possible adverse reactions/side effects.    Please note   You were cared for by a hospitalist during your hospital stay. If you have any questions about your discharge medications or the care  you received while you were in the hospital after you are discharged, you can call the unit and asked to speak with the hospitalist on call if the hospitalist that took  care of you is not available. Once you are discharged, your primary care physician will handle any further medical issues. Please note that NO REFILLS for any discharge medications will be authorized once you are discharged, as it is imperative that you return to your primary care physician (or establish a relationship with a primary care physician if you do not have one) for your aftercare needs so that they can reassess your need for medications and monitor your lab values.       Time coordinating discharge: ***  Signed:  Earlee Herald  [facility] 08/21/2021, 1:20 PM   Pager on [email or web address]. If 7PM-7AM, please contact night-coverage at [email or web address]

## 2021-08-21 NOTE — TOC Progression Note (Addendum)
Transition of Care [facility]) - Progression Note    Patient Details  Name: Bethany Villa MRN: [id number] Date of Birth: 19-Apr-1956  Transition of Care [facility]) CM/SW Contact  Liliana Cline, LCSW Phone Number: 08/21/2021, 9:40 AM  Clinical Narrative:   Checked [facility] portal. Insurance Berkley Harvey is still pending. TOC will continue to follow.  11:35- Auth still pending. Reached out to Gavin Pound at [facility] to ask if they can take patient later this afternoon if Berkley Harvey is obtained, waiting on response. Per MD patient is medically ready.  1:00- Auth still pending. [facility]. They reported it is still waiting to be reviewed by their Medical Director. [facility] to call this CSW with outcome. Per Gavin Pound at [facility] they can take patient today or tomorrow, but need DC Summary by 4 pm today. Updated MD.   2:30- Call from [facility] requesting peer to peer. [phone] option 5. Due 10 am 12/27. Updated MD.  3:30- Per MD, peer to peer was approved. Per Gavin Pound at [facility] they cannot take patient until Monday but will need DC Summary by 4 pm today due to their pharmacy hours. Per MD this cannot be put in this early. Notified Gavin Pound who said they can try to check with [facility] on Monday but cannot guarantee that they can take patient Monday.  Expected Discharge Plan: Skilled Nursing Facility Barriers to Discharge: Continued Medical Work up  Expected Discharge Plan and Services Expected Discharge Plan: Skilled Nursing Facility       Living arrangements for the past 2 months: Single Family Home                                       Social Determinants of Health (SDOH) Interventions    Readmission Risk Interventions Readmission Risk Prevention Plan 08/18/2021  Transportation Screening Complete  HRI or Home Care Consult Complete  Social Work Consult for Recovery Care Planning/Counseling Complete  Palliative Care Screening Not Applicable  Medication Review Furniture conservator/restorer) Complete  Some recent data might be hidden

## 2021-08-21 NOTE — Progress Notes (Signed)
PULMONOLOGY     Name: Bethany Villa MRN: [id number] DOB: 1956/07/22     LOS: 6   SUBJECTIVE FINDINGS & SIGNIFICANT EVENTS    Patient description:  65 yo patient with hx of bedbugs, COPD, lifelong smoking, heart failure came in from facility due to worsening doe/sob cough with s/s of AECOPD and flu like illness.   08/21/21- patient continues to have dyspnea related to right rib pain   Lines/tubes :   Microbiology/Sepsis markers: Results for orders placed or performed during the hospital encounter of 08/15/21  Resp Panel by RT-PCR (Flu A&B, Covid) Nasopharyngeal Swab     Status: None   Collection Time: 08/15/21  3:42 PM   Specimen: Nasopharyngeal Swab; Nasopharyngeal(NP) swabs in vial transport medium  Result Value Ref Range Status   SARS Coronavirus 2 by RT PCR NEGATIVE NEGATIVE Final    Comment: (NOTE) SARS-CoV-2 target nucleic acids are NOT DETECTED.  The SARS-CoV-2 RNA is generally detectable in upper respiratory specimens during the acute phase of infection. The lowest concentration of SARS-CoV-2 viral copies this assay can detect is 138 copies/mL. A negative result does not preclude SARS-Cov-2 infection and should not be used as the sole basis for treatment or other patient management decisions. A negative result may occur with  improper specimen collection/handling, submission of specimen other than nasopharyngeal swab, presence of viral mutation(s) within the areas targeted by this assay, and inadequate number of viral copies(<138 copies/mL). A negative result must be combined with clinical observations, patient history, and epidemiological information. The expected result is Negative.  Fact Sheet for Patients:  EntrepreneurPulse.com.au  Fact Sheet for Healthcare Providers:   IncredibleEmployment.be  This test is no t yet approved or cleared by the [place] FDA and  has been authorized for detection and/or diagnosis of SARS-CoV-2 by FDA under an Emergency Use Authorization (EUA). This EUA will remain  in effect (meaning this test can be used) for the duration of the COVID-19 declaration under Section 564(b)(1) of the Act, 21 U.S.C.section 360bbb-3(b)(1), unless the authorization is terminated  or revoked sooner.       Influenza A by PCR NEGATIVE NEGATIVE Final   Influenza B by PCR NEGATIVE NEGATIVE Final    Comment: (NOTE) The Xpert Xpress SARS-CoV-2/FLU/RSV plus assay is intended as an aid in the diagnosis of influenza from Nasopharyngeal swab specimens and should not be used as a sole basis for treatment. Nasal washings and aspirates are unacceptable for Xpert Xpress SARS-CoV-2/FLU/RSV testing.  Fact Sheet for Patients: EntrepreneurPulse.com.au  Fact Sheet for Healthcare Providers: IncredibleEmployment.be  This test is not yet approved or cleared by the [place] FDA and has been authorized for detection and/or diagnosis of SARS-CoV-2 by FDA under an Emergency Use Authorization (EUA). This EUA will remain in effect (meaning this test can be used) for the duration of the COVID-19 declaration under Section 564(b)(1) of the Act, 21 U.S.C. section 360bbb-3(b)(1), unless the authorization is terminated or revoked.  Performed at [facility], [address] [place], [place] [address]   Blood Culture (routine x 2)     Status: None   Collection Time: 08/15/21  3:42 PM   Specimen: BLOOD  Result Value Ref Range Status   Specimen Description BLOOD RIGHT [facility]  Final   Special Requests   Final    BOTTLES DRAWN AEROBIC AND ANAEROBIC Blood Culture adequate volume   Culture   Final    NO GROWTH 5 DAYS Performed at [facility], [place],  [place] [address]     Report Status 08/20/2021 FINAL  Final  Blood Culture (routine x 2)     Status: None   Collection Time: 08/15/21  3:42 PM   Specimen: BLOOD  Result Value Ref Range Status   Specimen Description BLOOD RIGHT WRIST  Final   Special Requests   Final    BOTTLES DRAWN AEROBIC AND ANAEROBIC Blood Culture adequate volume   Culture   Final    NO GROWTH 5 DAYS Performed at [facility], [address] [place], [place] [address]    Report Status 08/20/2021 FINAL  Final  Respiratory (~20 pathogens) panel by PCR     Status: None   Collection Time: 08/15/21  7:40 PM   Specimen: Nasopharyngeal Swab; Respiratory  Result Value Ref Range Status   Adenovirus NOT DETECTED NOT DETECTED Final   Coronavirus 229E NOT DETECTED NOT DETECTED Final    Comment: (NOTE) The Coronavirus on the Respiratory Panel, DOES NOT test for the novel  Coronavirus (2019 nCoV)    Coronavirus HKU1 NOT DETECTED NOT DETECTED Final   Coronavirus NL63 NOT DETECTED NOT DETECTED Final   Coronavirus OC43 NOT DETECTED NOT DETECTED Final   Metapneumovirus NOT DETECTED NOT DETECTED Final   Rhinovirus / Enterovirus NOT DETECTED NOT DETECTED Final   Influenza A NOT DETECTED NOT DETECTED Final   Influenza B NOT DETECTED NOT DETECTED Final   Parainfluenza Virus 1 NOT DETECTED NOT DETECTED Final   Parainfluenza Virus 2 NOT DETECTED NOT DETECTED Final   Parainfluenza Virus 3 NOT DETECTED NOT DETECTED Final   Parainfluenza Virus 4 NOT DETECTED NOT DETECTED Final   Respiratory Syncytial Virus NOT DETECTED NOT DETECTED Final   Bordetella pertussis NOT DETECTED NOT DETECTED Final   Bordetella Parapertussis NOT DETECTED NOT DETECTED Final   Chlamydophila pneumoniae NOT DETECTED NOT DETECTED Final   Mycoplasma pneumoniae NOT DETECTED NOT DETECTED Final    Comment: Performed at [facility], [place] [address] [place], [place] [address]  CULTURE, BLOOD (ROUTINE X 2) w Reflex to ID Panel     Status: None   Collection Time:  08/15/21  7:47 PM   Specimen: BLOOD  Result Value Ref Range Status   Specimen Description BLOOD BLOOD LEFT HAND  Final   Special Requests   Final    BOTTLES DRAWN AEROBIC ONLY Blood Culture results may not be optimal due to an inadequate volume of blood received in culture bottles   Culture   Final    NO GROWTH 5 DAYS Performed at [facility], [address] [place], [place] [address]    Report Status 08/20/2021 FINAL  Final  CULTURE, BLOOD (ROUTINE X 2) w Reflex to ID Panel     Status: None   Collection Time: 08/15/21  7:47 PM   Specimen: BLOOD  Result Value Ref Range Status   Specimen Description BLOOD BLOOD LEFT HAND  Final   Special Requests   Final    BOTTLES DRAWN AEROBIC ONLY Blood Culture results may not be optimal due to an inadequate volume of blood received in culture bottles   Culture   Final    NO GROWTH 5 DAYS Performed at [facility], [address], [place], [place] [address]    Report Status 08/20/2021 FINAL  Final  MRSA Next Gen by PCR, Nasal     Status: None   Collection Time: 08/15/21  9:20 PM   Specimen: Nasal Mucosa; Nasal Swab  Result Value Ref Range Status  MRSA by PCR Next Gen NOT DETECTED NOT DETECTED Final    Comment: (NOTE) The GeneXpert MRSA Assay (FDA approved for NASAL specimens only), is one component of a comprehensive MRSA colonization surveillance program. It is not intended to diagnose MRSA infection nor to guide or monitor treatment for MRSA infections. Test performance is not FDA approved in patients less than 40 years old. Performed at [facility], [address] [place], [place] [address]     Anti-infectives:  Anti-infectives (From admission, onward)    Start     Dose/Rate Route Frequency Ordered Stop   08/16/21 1630  azithromycin (ZITHROMAX) 500 mg in sodium chloride 0.9 % 250 mL IVPB  Status:  Discontinued        500 mg 250 mL/hr over 60 Minutes Intravenous Every 24 hours 08/16/21 0237 08/16/21  1350   08/16/21 1445  azithromycin (ZITHROMAX) tablet 500 mg        500 mg Oral Daily 08/16/21 1351 08/20/21 1025   08/15/21 1545  vancomycin (VANCOCIN) IVPB 1000 mg/200 mL premix  Status:  Discontinued        1,000 mg 200 mL/hr over 60 Minutes Intravenous  Once 08/15/21 1542 08/15/21 1548   08/15/21 1545  ceFEPIme (MAXIPIME) 2 g in sodium chloride 0.9 % 100 mL IVPB        2 g 200 mL/hr over 30 Minutes Intravenous  Once 08/15/21 1542 08/15/21 1648   08/15/21 1545  azithromycin (ZITHROMAX) 500 mg in sodium chloride 0.9 % 250 mL IVPB        500 mg 250 mL/hr over 60 Minutes Intravenous  Once 08/15/21 1542 08/15/21 1732        PAST MEDICAL HISTORY   Past Medical History:  Diagnosis Date   Anxiety    Asthma    Chronic pain    lower back   Chronic systolic CHF (congestive heart failure) (HCC) 09/22/2019   COPD (chronic obstructive pulmonary disease) (HCC)    Depression    DJD (degenerative joint disease)    Fibromyalgia    GERD (gastroesophageal reflux disease)    Headache    Hyperlipidemia    Lower extremity edema    On home oxygen therapy    has not been on since 2016   Panic attack    Shortness of breath dyspnea    Sleep disorder      SURGICAL HISTORY   Past Surgical History:  Procedure Laterality Date   ABDOMINAL SURGERY     ANKLE SURGERY     BACK SURGERY     lower   CARPAL TUNNEL RELEASE     CESAREAN SECTION     I & D KNEE WITH POLY EXCHANGE N/A 06/11/2020   Procedure: IRRIGATION AND DEBRIDEMENT KNEE WITH POLY EXCHANGE;  Surgeon: Christena Flake, MD;  Location: [facility] ORS;  Service: Orthopedics;  Laterality: N/A;   KNEE SURGERY     TONSILLECTOMY     TOTAL KNEE ARTHROPLASTY Left 04/14/2016   Procedure: TOTAL KNEE ARTHROPLASTY;  Surgeon: Deeann Saint, MD;  Location: [facility] ORS;  Service: Orthopedics;  Laterality: Left;   TUBAL LIGATION       FAMILY HISTORY   Family History  Problem Relation Age of Onset   Cancer Mother        beast     SOCIAL HISTORY    Social History   Tobacco Use   Smoking status: Every Day    Packs/day: 1.00    Years: 45.00    Pack years: 45.00  Types: Cigarettes   Smokeless tobacco: Never  Vaping Use   Vaping Use: Never used  Substance Use Topics   Alcohol use: Yes    Alcohol/week: 0.0 standard drinks    Comment: rare   Drug use: No     MEDICATIONS   Current Medication:  Current Facility-Administered Medications:    0.9 %  sodium chloride infusion, 250 mL, Intravenous, Continuous, Ouma, Bing Neighbors, NP, Held at 08/15/21 1952   acetaminophen (TYLENOL) tablet 650 mg, 650 mg, Oral, Q6H, Jennye Boroughs, MD, 650 mg at 08/21/21 1150   acetaminophen (TYLENOL) tablet 650 mg, 650 mg, Oral, Q6H PRN, Jennye Boroughs, MD   albuterol (PROVENTIL) (2.5 MG/3ML) 0.083% nebulizer solution 2.5 mg, 2.5 mg, Nebulization, Q2H PRN, Priscella Mann, Sudheer B, MD   arformoterol (BROVANA) nebulizer solution 15 mcg, 15 mcg, Nebulization, BID, Priscella Mann, Sudheer B, MD, 15 mcg at 08/21/21 0722   budesonide (PULMICORT) nebulizer solution 0.25 mg, 0.25 mg, Nebulization, BID, Ouma, Bing Neighbors, NP, 0.25 mg at 08/21/21 [id number]   clopidogrel (PLAVIX) tablet 75 mg, 75 mg, Oral, Daily, Sreenath, Sudheer B, MD, 75 mg at 08/21/21 1151   docusate sodium (COLACE) capsule 100 mg, 100 mg, Oral, BID PRN, Ottie Glazier, MD, 100 mg at 08/21/21 1150   enoxaparin (LOVENOX) injection 40 mg, 40 mg, Subcutaneous, QHS, Davin Archuletta, MD, 40 mg at 08/20/21 2003   guaiFENesin-dextromethorphan (ROBITUSSIN DM) 100-10 MG/5ML syrup 5 mL, 5 mL, Oral, Q4H PRN, Priscella Mann, Sudheer B, MD, 5 mL at 08/20/21 1634   HYDROmorphone (DILAUDID) injection 0.5 mg, 0.5 mg, Intravenous, Q4H PRN, Jennye Boroughs, MD, 0.5 mg at 08/21/21 [id number]   ipratropium-albuterol (DUONEB) 0.5-2.5 (3) MG/3ML nebulizer solution 3 mL, 3 mL, Nebulization, BID, Jennye Boroughs, MD, 3 mL at 08/21/21 [id number]   lactulose (CHRONULAC) 10 GM/15ML solution 20 g, 20 g, Oral, BID, Sreenath, Sudheer B, MD,  20 g at 08/17/21 1034   lidocaine (LIDODERM) 5 % 1 patch, 1 patch, Transdermal, Q24H, Sreenath, Sudheer B, MD, 1 patch at 08/18/21 0831   losartan (COZAAR) tablet 50 mg, 50 mg, Oral, Daily, Sreenath, Sudheer B, MD, 50 mg at 08/21/21 1151   melatonin tablet 2.5 mg, 2.5 mg, Oral, QHS, Tonye Royalty, NP, 2.5 mg at 08/20/21 2003   methocarbamol (ROBAXIN) tablet 750 mg, 750 mg, Oral, TID, Priscella Mann, Sudheer B, MD, 750 mg at 08/21/21 1150   oxyCODONE (Oxy IR/ROXICODONE) immediate release tablet 5 mg, 5 mg, Oral, Q4H PRN, Priscella Mann, Sudheer B, MD, 5 mg at 08/21/21 1151   pantoprazole (PROTONIX) EC tablet 40 mg, 40 mg, Oral, Daily, Priscella Mann, Sudheer B, MD, 40 mg at 08/21/21 1150   polyethylene glycol (MIRALAX / GLYCOLAX) packet 17 g, 17 g, Oral, Daily PRN, Ottie Glazier, MD    ALLERGIES   Gabapentin, Mobic [meloxicam], Penicillins, and Sulfa antibiotics    REVIEW OF SYSTEMS     10 point ros done and is negative except generalized pain   PHYSICAL EXAMINATION   Vital Signs: Temp:  [98.1 F (36.7 C)-98.3 F (36.8 C)] 98.2 F (36.8 C) (12/23 0908) Pulse Rate:  [78-97] 95 (12/23 0908) Resp:  [16-20] 16 (12/23 0908) BP: (95-113)/(54-79) 107/69 (12/23 0908) SpO2:  [93 %-97 %] 97 % (12/23 0908) Weight:  [89.4 kg] 89.4 kg (12/23 0500)  GENERAL:Age appropriate mild distress HEAD: Normocephalic, atraumatic.  EYES: Pupils equal, round, reactive to light.  No scleral icterus.  MOUTH: Moist mucosal membrane. NECK: Supple. No thyromegaly. No nodules. No JVD.  PULMONARY: mild crackles CARDIOVASCULAR: S1 and S2. Regular rate and rhythm.  No murmurs, rubs, or gallops.  GASTROINTESTINAL: Soft, nontender, non-distended. No masses. Positive bowel sounds. No hepatosplenomegaly.  MUSCULOSKELETAL: No swelling, clubbing, or edema.  NEUROLOGIC: Mild distress due to acute illness SKIN:intact,warm,dry   PERTINENT DATA     Infusions:  sodium chloride Stopped (08/15/21 1952)   Scheduled  Medications:  acetaminophen  650 mg Oral Q6H   arformoterol  15 mcg Nebulization BID   budesonide (PULMICORT) nebulizer solution  0.25 mg Nebulization BID   clopidogrel  75 mg Oral Daily   enoxaparin (LOVENOX) injection  40 mg Subcutaneous QHS   ipratropium-albuterol  3 mL Nebulization BID   lactulose  20 g Oral BID   lidocaine  1 patch Transdermal Q24H   losartan  50 mg Oral Daily   melatonin  2.5 mg Oral QHS   methocarbamol  750 mg Oral TID   pantoprazole  40 mg Oral Daily   PRN Medications: acetaminophen, albuterol, docusate sodium, guaiFENesin-dextromethorphan, HYDROmorphone (DILAUDID) injection, oxyCODONE, polyethylene glycol Hemodynamic parameters:   Intake/Output: 12/22 0701 - 12/23 0700 In: 480 [P.O.:480] Out: 1450 [Urine:1450]  Ventilator  Settings:    LAB RESULTS:  Basic Metabolic Panel: Recent Labs  Lab 08/15/21 1542 08/15/21 1947 08/16/21 0411 08/21/21 0451  NA 136  --  134* 136  K 4.3  --  4.7 4.3  CL 99  --  102 95*  CO2 32  --  28 36*  GLUCOSE 114*  --  138* 94  BUN 30*  --  26* 25*  CREATININE 1.27*  --  0.76 0.74  CALCIUM 7.9*  --  8.1* 8.0*  MG  --  2.1  --   --     Liver Function Tests: Recent Labs  Lab 08/15/21 1542  AST 51*  ALT 35  ALKPHOS 57  BILITOT 0.8  PROT 6.6  ALBUMIN 3.2*    No results for input(s): LIPASE, AMYLASE in the last 168 hours. Recent Labs  Lab 08/15/21 1710  AMMONIA 33    CBC: Recent Labs  Lab 08/15/21 1542 08/16/21 0411 08/20/21 1917  WBC 20.2* 12.5* 15.0*  NEUTROABS 13.9* 11.4* 8.2*  HGB 12.7 11.4* 12.6  HCT 42.4 37.9 40.3  MCV 99.1 95.7 92.0  PLT 170 141* 202    Cardiac Enzymes: No results for input(s): CKTOTAL, CKMB, CKMBINDEX, TROPONINI in the last 168 hours. BNP: Invalid input(s): POCBNP CBG: Recent Labs  Lab 08/17/21 0324 08/17/21 0809 08/17/21 1128 08/17/21 1535 08/17/21 1936  GLUCAP 89 104* 163* 181* 132*        IMAGING RESULTS:  Imaging: DG Chest Port 1 View  Result  Date: 08/21/2021 CLINICAL DATA:  Shortness of breath EXAM: PORTABLE CHEST 1 VIEW COMPARISON:  08/15/2021 FINDINGS: The heart size and mediastinal contours are within normal limits. Chronic interstitial changes. Pulmonary vascular congestion. Left upper lung cavitary structure on the prior study is not identified. No pleural effusion or pneumothorax. Normal heart size. IMPRESSION: COPD.  Pulmonary vascular congestion.  No focal consolidation. Electronically Signed   By: Macy Mis M.D.   On: 08/21/2021 08:43   @PROBHOSP @ DG Chest Port 1 View  Result Date: 08/21/2021 CLINICAL DATA:  Shortness of breath EXAM: PORTABLE CHEST 1 VIEW COMPARISON:  08/15/2021 FINDINGS: The heart size and mediastinal contours are within normal limits. Chronic interstitial changes. Pulmonary vascular congestion. Left upper lung cavitary structure on the prior study is not identified. No pleural effusion or pneumothorax. Normal heart size. IMPRESSION: COPD.  Pulmonary vascular congestion.  No focal consolidation. Electronically Signed   By:  Macy Mis M.D.   On: 08/21/2021 08:43        ASSESSMENT AND PLAN    -Multidisciplinary rounds held today  Acute Hypoxic Hypercapnic Respiratory Failure   -due to  severe COPD exacerbation   - RVP- negative  -continue supplemental O2 spO2 goal 90% currently on 1.5l/min -continue Bronchodilator Therapy -off vasopressors  Circulatory shock-RESOLVED   - possible cardiac vs septic   - blood cultures  - lactate  - crp  -on levophed currently   - CXR with edema caution with fluids   Acute on chronic diastilic CHF iBNP 0000000 CXR with pulm edema -oxygen as needed -Lasix as tolerated -follow up cardiac biomarkers ICU monitoring  Renal Failure-most likely due to ATN-RESOLVED -follow chem 7 -follow UO -continue Foley Catheter-assess need daily Dc nephrotoxins  Bed bugs  - hygiene precautions    ID -continue IV abx as prescibed -follow up  cultures  GI/Nutrition GI PROPHYLAXIS as indicated DIET-->TF's as tolerated Constipation protocol as indicated  ENDO - ICU hypoglycemic\Hyperglycemia protocol -check FSBS per protocol   ELECTROLYTES -follow labs as needed -replace as needed -pharmacy consultation   DVT/GI PRX ordered -SCDs  TRANSFUSIONS AS NEEDED MONITOR FSBS ASSESS the need for LABS as needed  Ottie Glazier, M.D.  Pulmonary & Critical Care Medicine       Ottie Glazier, M.D.  Division of [place]

## 2021-08-21 NOTE — Progress Notes (Addendum)
Progress Note    Bethany Villa  WGN:[id number] DOB: [birth year]  DOA: 08/15/2021 PCP: [name], MD      Brief Narrative:    Medical records reviewed and are as summarized below:  Bethany Villa is a 65 y.o. female  with medical history significant for bedbugs infestation, COPD, chronic hypoxemic respiratory failure on 2 L/min oxygen at night, chronic diastolic CHF, suspected liver cirrhosis, tobacco use disorder, recent discharge from the hospital on 08/13/2021 for altered mental status, pneumonia, COPD exacerbation, rib fracture and respiratory failure.  She presented to the hospital with cough, shortness of breath, wheezing and lethargy.   She was found to have COPD exacerbation, acute on chronic diastolic CHF and circulatory shock requiring IV fluids and Levophed.  She was treated with IV steroids, bronchodilators and empiric IV antibiotics.  She also received IV Lasix for CHF exacerbation.  She was initially treated with BiPAP for acute hypoxemic and hypercapnic respiratory failure.  She was weaned off of BiPAP to test per minute oxygen via nasal cannula.  She was evaluated by PT and OT who recommended further rehabilitation at a skilled nursing facility.        Assessment/Plan:   Principal Problem:   Acute exacerbation of COPD with asthma (HCC)   Body mass index is 33.83 kg/m. (Obesity)  Acute exacerbation of COPD: Repeat chest x-ray today did not show any acute abnormality.  Continue bronchodilators.  She completed steroids and antibiotics.  Acute on chronic diastolic CHF: Improved.  Acute on chronic hypoxemic respiratory failure, acute hypercapnic respiratory failure: Improved.  She is tolerating 2 L/min oxygen via nasal cannula.  She uses 2 L/min oxygen at night.  She tolerates room air during the day.  Musculoskeletal pain/right chest wall pain: Continue oxycodone as needed for pain.  Add Tylenol.  We will try to report IV Dilaudid if possible.  She  also has chronic back pain and she was scheduled to go to the pain clinic on 08/20/2021.  She will follow-up with her pain clinic at discharge.  AKI: Improved  Hypotension: Hold losartan and monitor BP closely.  Circulatory shock: Resolved  History of bedbugs: No acute issues  I was asked to do a peer to peer review with insurance company because authorization for discharge to SNF had been denied.  I spoke to Dr. Rolanda Jay who works for [employer].  After discussing the case and providing him with information from updated PT notes, he has approved discharge to SNF.  Case was discussed with Aundra Millet, Child psychotherapist.  I was informed that nursing home cannot take the patient until Monday, 08/24/2021.   Diet Order             Diet Heart Room service appropriate? Yes; Fluid consistency: Thin  Diet effective now                      Consultants: Intensivist/pulmonologist  Procedures: None    Medications:    acetaminophen  650 mg Oral Q6H   arformoterol  15 mcg Nebulization BID   budesonide (PULMICORT) nebulizer solution  0.25 mg Nebulization BID   clopidogrel  75 mg Oral Daily   enoxaparin (LOVENOX) injection  40 mg Subcutaneous QHS   ipratropium-albuterol  3 mL Nebulization BID   lactulose  20 g Oral BID   lidocaine  1 patch Transdermal Q24H   losartan  50 mg Oral Daily   melatonin  2.5 mg Oral QHS  methocarbamol  750 mg Oral TID   pantoprazole  40 mg Oral Daily   Continuous Infusions:  sodium chloride Stopped (08/15/21 1952)     Anti-infectives (From admission, onward)    Start     Dose/Rate Route Frequency Ordered Stop   08/16/21 1630  azithromycin (ZITHROMAX) 500 mg in sodium chloride 0.9 % 250 mL IVPB  Status:  Discontinued        500 mg 250 mL/hr over 60 Minutes Intravenous Every 24 hours 08/16/21 0237 08/16/21 1350   08/16/21 1445  azithromycin (ZITHROMAX) tablet 500 mg        500 mg Oral Daily 08/16/21 1351 08/20/21 1025   08/15/21 1545   vancomycin (VANCOCIN) IVPB 1000 mg/200 mL premix  Status:  Discontinued        1,000 mg 200 mL/hr over 60 Minutes Intravenous  Once 08/15/21 1542 08/15/21 1548   08/15/21 1545  ceFEPIme (MAXIPIME) 2 g in sodium chloride 0.9 % 100 mL IVPB        2 g 200 mL/hr over 30 Minutes Intravenous  Once 08/15/21 1542 08/15/21 1648   08/15/21 1545  azithromycin (ZITHROMAX) 500 mg in sodium chloride 0.9 % 250 mL IVPB        500 mg 250 mL/hr over 60 Minutes Intravenous  Once 08/15/21 1542 08/15/21 1732              Family Communication/Anticipated D/C date and plan/Code Status   DVT prophylaxis: enoxaparin (LOVENOX) injection 40 mg Start: 08/16/21 2200 SCDs Start: 08/15/21 1817     Code Status: Full Code  Family Communication: None  Disposition Plan: Plan to discharge to SNF today or tomorrow pending insurance authorization   Status is: Inpatient  Remains inpatient appropriate because: Inadequate pain control and awaiting placement           Subjective:   C/o right-sided chest pain.  No shortness of breath  Objective:    Vitals:   08/21/21 0908 08/21/21 1506 08/21/21 1508 08/21/21 1525  BP: 107/69 (!) 78/46 (!) 78/46 (!) 90/52  Pulse: 95 86 68 82  Resp: 16 16  17   Temp: 98.2 F (36.8 C) 98 F (36.7 C)  99.8 F (37.7 C)  TempSrc: Oral Oral  Oral  SpO2: 97% 97%  98%  Weight:      Height:       No data found.   Intake/Output Summary (Last 24 hours) at 08/21/2021 1619 Last data filed at 08/21/2021 1525 Gross per 24 hour  Intake 120 ml  Output 1350 ml  Net -1230 ml   Filed Weights   08/15/21 1539 08/17/21 0500 08/21/21 0500  Weight: 81.6 kg 83.6 kg 89.4 kg    Exam:  GEN: NAD SKIN: No rash on exposed skin EYES: EOMI ENT: MMM CV: RRR PULM: No wheezing or rales at ABD: soft, obese, NT, +BS CNS: AAO x 3, non focal EXT: No edema or tenderness        Data Reviewed:   I have personally reviewed following labs and imaging studies:  Labs: Labs  show the following:   Basic Metabolic Panel: Recent Labs  Lab 08/15/21 1542 08/15/21 1947 08/16/21 0411 08/21/21 0451  NA 136  --  134* 136  K 4.3  --  4.7 4.3  CL 99  --  102 95*  CO2 32  --  28 36*  GLUCOSE 114*  --  138* 94  BUN 30*  --  26* 25*  CREATININE 1.27*  --  0.76  0.74  CALCIUM 7.9*  --  8.1* 8.0*  MG  --  2.1  --   --    GFR Estimated Creatinine Clearance: 76.9 mL/min (by C-G formula based on SCr of 0.74 mg/dL). Liver Function Tests: Recent Labs  Lab 08/15/21 1542  AST 51*  ALT 35  ALKPHOS 57  BILITOT 0.8  PROT 6.6  ALBUMIN 3.2*   No results for input(s): LIPASE, AMYLASE in the last 168 hours. Recent Labs  Lab 08/15/21 1710  AMMONIA 33   Coagulation profile Recent Labs  Lab 08/15/21 1542  INR 1.0    CBC: Recent Labs  Lab 08/15/21 1542 08/16/21 0411 08/20/21 1917  WBC 20.2* 12.5* 15.0*  NEUTROABS 13.9* 11.4* 8.2*  HGB 12.7 11.4* 12.6  HCT 42.4 37.9 40.3  MCV 99.1 95.7 92.0  PLT 170 141* 202   Cardiac Enzymes: No results for input(s): CKTOTAL, CKMB, CKMBINDEX, TROPONINI in the last 168 hours. BNP (last 3 results) No results for input(s): PROBNP in the last 8760 hours. CBG: Recent Labs  Lab 08/17/21 0324 08/17/21 0809 08/17/21 1128 08/17/21 1535 08/17/21 1936  GLUCAP 89 104* 163* 181* 132*   D-Dimer: No results for input(s): DDIMER in the last 72 hours. Hgb A1c: No results for input(s): HGBA1C in the last 72 hours. Lipid Profile: No results for input(s): CHOL, HDL, LDLCALC, TRIG, CHOLHDL, LDLDIRECT in the last 72 hours. Thyroid function studies: No results for input(s): TSH, T4TOTAL, T3FREE, THYROIDAB in the last 72 hours.  Invalid input(s): FREET3 Anemia work up: No results for input(s): VITAMINB12, FOLATE, FERRITIN, TIBC, IRON, RETICCTPCT in the last 72 hours. Sepsis Labs: Recent Labs  Lab 08/15/21 1542 08/15/21 1817 08/16/21 0411 08/20/21 1917  WBC 20.2*  --  12.5* 15.0*  LATICACIDVEN 0.8 1.2  --   --      Microbiology Recent Results (from the past 240 hour(s))  Resp Panel by RT-PCR (Flu A&B, Covid) Nasopharyngeal Swab     Status: None   Collection Time: 08/15/21  3:42 PM   Specimen: Nasopharyngeal Swab; Nasopharyngeal(NP) swabs in vial transport medium  Result Value Ref Range Status   SARS Coronavirus 2 by RT PCR NEGATIVE NEGATIVE Final    Comment: (NOTE) SARS-CoV-2 target nucleic acids are NOT DETECTED.  The SARS-CoV-2 RNA is generally detectable in upper respiratory specimens during the acute phase of infection. The lowest concentration of SARS-CoV-2 viral copies this assay can detect is 138 copies/mL. A negative result does not preclude SARS-Cov-2 infection and should not be used as the sole basis for treatment or other patient management decisions. A negative result may occur with  improper specimen collection/handling, submission of specimen other than nasopharyngeal swab, presence of viral mutation(s) within the areas targeted by this assay, and inadequate number of viral copies(<138 copies/mL). A negative result must be combined with clinical observations, patient history, and epidemiological information. The expected result is Negative.  Fact Sheet for Patients:  BloggerCourse.com  Fact Sheet for Healthcare Providers:  SeriousBroker.it  This test is no t yet approved or cleared by the [place] FDA and  has been authorized for detection and/or diagnosis of SARS-CoV-2 by FDA under an Emergency Use Authorization (EUA). This EUA will remain  in effect (meaning this test can be used) for the duration of the COVID-19 declaration under Section 564(b)(1) of the Act, 21 U.S.C.section 360bbb-3(b)(1), unless the authorization is terminated  or revoked sooner.       Influenza A by PCR NEGATIVE NEGATIVE Final   Influenza B by PCR  NEGATIVE NEGATIVE Final    Comment: (NOTE) The Xpert Xpress SARS-CoV-2/FLU/RSV plus assay is  intended as an aid in the diagnosis of influenza from Nasopharyngeal swab specimens and should not be used as a sole basis for treatment. Nasal washings and aspirates are unacceptable for Xpert Xpress SARS-CoV-2/FLU/RSV testing.  Fact Sheet for Patients: BloggerCourse.com  Fact Sheet for Healthcare Providers: SeriousBroker.it  This test is not yet approved or cleared by the [place] FDA and has been authorized for detection and/or diagnosis of SARS-CoV-2 by FDA under an Emergency Use Authorization (EUA). This EUA will remain in effect (meaning this test can be used) for the duration of the COVID-19 declaration under Section 564(b)(1) of the Act, 21 U.S.C. section 360bbb-3(b)(1), unless the authorization is terminated or revoked.  Performed at [facility], [address] [place], [place] [address]   Blood Culture (routine x 2)     Status: None   Collection Time: 08/15/21  3:42 PM   Specimen: BLOOD  Result Value Ref Range Status   Specimen Description BLOOD RIGHT [facility]  Final   Special Requests   Final    BOTTLES DRAWN AEROBIC AND ANAEROBIC Blood Culture adequate volume   Culture   Final    NO GROWTH 5 DAYS Performed at [facility], [address] [place], [place] [address]    Report Status 08/20/2021 FINAL  Final  Blood Culture (routine x 2)     Status: None   Collection Time: 08/15/21  3:42 PM   Specimen: BLOOD  Result Value Ref Range Status   Specimen Description BLOOD RIGHT WRIST  Final   Special Requests   Final    BOTTLES DRAWN AEROBIC AND ANAEROBIC Blood Culture adequate volume   Culture   Final    NO GROWTH 5 DAYS Performed at [facility], [address] [place], [place] [address]    Report Status 08/20/2021 FINAL  Final  Respiratory (~20 pathogens) panel by PCR     Status: None   Collection Time: 08/15/21  7:40 PM   Specimen: Nasopharyngeal Swab; Respiratory  Result Value  Ref Range Status   Adenovirus NOT DETECTED NOT DETECTED Final   Coronavirus 229E NOT DETECTED NOT DETECTED Final    Comment: (NOTE) The Coronavirus on the Respiratory Panel, DOES NOT test for the novel  Coronavirus (2019 nCoV)    Coronavirus HKU1 NOT DETECTED NOT DETECTED Final   Coronavirus NL63 NOT DETECTED NOT DETECTED Final   Coronavirus OC43 NOT DETECTED NOT DETECTED Final   Metapneumovirus NOT DETECTED NOT DETECTED Final   Rhinovirus / Enterovirus NOT DETECTED NOT DETECTED Final   Influenza A NOT DETECTED NOT DETECTED Final   Influenza B NOT DETECTED NOT DETECTED Final   Parainfluenza Virus 1 NOT DETECTED NOT DETECTED Final   Parainfluenza Virus 2 NOT DETECTED NOT DETECTED Final   Parainfluenza Virus 3 NOT DETECTED NOT DETECTED Final   Parainfluenza Virus 4 NOT DETECTED NOT DETECTED Final   Respiratory Syncytial Virus NOT DETECTED NOT DETECTED Final   Bordetella pertussis NOT DETECTED NOT DETECTED Final   Bordetella Parapertussis NOT DETECTED NOT DETECTED Final   Chlamydophila pneumoniae NOT DETECTED NOT DETECTED Final   Mycoplasma pneumoniae NOT DETECTED NOT DETECTED Final    Comment: Performed at [facility], [address] [place], [place] [address]  CULTURE, BLOOD (ROUTINE X 2) w Reflex to ID Panel     Status: None   Collection Time: 08/15/21  7:47 PM   Specimen: BLOOD  Result  Value Ref Range Status   Specimen Description BLOOD BLOOD LEFT HAND  Final   Special Requests   Final    BOTTLES DRAWN AEROBIC ONLY Blood Culture results may not be optimal due to an inadequate volume of blood received in culture bottles   Culture   Final    NO GROWTH 5 DAYS Performed at [facility], [address] [place], [place] [address]    Report Status 08/20/2021 FINAL  Final  CULTURE, BLOOD (ROUTINE X 2) w Reflex to ID Panel     Status: None   Collection Time: 08/15/21  7:47 PM   Specimen: BLOOD  Result Value Ref Range Status   Specimen Description BLOOD BLOOD LEFT  HAND  Final   Special Requests   Final    BOTTLES DRAWN AEROBIC ONLY Blood Culture results may not be optimal due to an inadequate volume of blood received in culture bottles   Culture   Final    NO GROWTH 5 DAYS Performed at [facility], [address], [place], [place] [address]    Report Status 08/20/2021 FINAL  Final  MRSA Next Gen by PCR, Nasal     Status: None   Collection Time: 08/15/21  9:20 PM   Specimen: Nasal Mucosa; Nasal Swab  Result Value Ref Range Status   MRSA by PCR Next Gen NOT DETECTED NOT DETECTED Final    Comment: (NOTE) The GeneXpert MRSA Assay (FDA approved for NASAL specimens only), is one component of a comprehensive MRSA colonization surveillance program. It is not intended to diagnose MRSA infection nor to guide or monitor treatment for MRSA infections. Test performance is not FDA approved in patients less than 1 years old. Performed at [facility], [address] [place], [place] [address]     Procedures and diagnostic studies:  DG Chest [facility] 1 View  Result Date: 08/21/2021 CLINICAL DATA:  Shortness of breath EXAM: PORTABLE CHEST 1 VIEW COMPARISON:  08/15/2021 FINDINGS: The heart size and mediastinal contours are within normal limits. Chronic interstitial changes. Pulmonary vascular congestion. Left upper lung cavitary structure on the prior study is not identified. No pleural effusion or pneumothorax. Normal heart size. IMPRESSION: COPD.  Pulmonary vascular congestion.  No focal consolidation. Electronically Signed   By: Guadlupe Spanish M.D.   On: 08/21/2021 08:43               LOS: 6 days   [address]  [facility]   Pager on [email or web address]. If 7PM-7AM, please contact night-coverage at [email or web address]     08/21/2021, 4:19 PM

## 2021-08-21 NOTE — Progress Notes (Signed)
Occupational Therapy Treatment Patient Details Name: Bethany Villa MRN: [id number] DOB: 04/24/1956 Today's Date: 08/21/2021   History of present illness Pt is a 65 y.o. female presenting to hospital 12/17 with AMS and respiratory distress; also hypotensive and pain all over.  Recent hospitalization for PNA.  Pt admitted with acute exacerbation of COPD, acute hypoxic and hypercarbic respiratory failure, circulatory shock, acute on chronic diastolic CHF, AKI, and bed bugs.  PMH includes COPD, CHF, anxiety, chronic LBP, fibromyalgia, panic attack, sleep disorder, ankle sx, low back sx, CTR, knee sx L, h/o bedbugs.   OT comments  Pt seen for OT tx this date to f/u re: safety with ADLs/ADL mobility. Pt somewhat self-limiting and difficult to motivate. She is finally agreeable to practicing sup to long sitting with use of tricep strength to reduce pain to abdomen. OT provides education and cues. In long sitting, pt able to perform hand hygiene with SETUP in prep for meal time. Pt left with all needs met and in reach. Educated about importance of increasing the frequency in which she is OOB. Pt understands education.    Recommendations for follow up therapy are one component of a multi-disciplinary discharge planning process, led by the attending physician.  Recommendations may be updated based on patient status, additional functional criteria and insurance authorization.    Follow Up Recommendations  Skilled nursing-short term rehab (<3 hours/day)    Assistance Recommended at Discharge Intermittent Supervision/Assistance  Equipment Recommendations  Other (comment) (defer)    Recommendations for Other Services      Precautions / Restrictions Precautions Precautions: Fall Restrictions Weight Bearing Restrictions: No       Mobility Bed Mobility Overal bed mobility: Needs Assistance             General bed mobility comments: MOD I for supine to long sitting, cues for use of triceps to  reduce pain on abdomen    Transfers                   General transfer comment: pt declines to get OOB     Balance                                           ADL either performed or assessed with clinical judgement   ADL Overall ADL's : Needs assistance/impaired     Grooming: Wash/dry hands;Sitting                                      Extremity/Trunk Assessment              Vision       Perception     Praxis      Cognition Arousal/Alertness: Awake/alert Behavior During Therapy: Anxious Overall Cognitive Status: Impaired/Different from baseline                                 General Comments: pt states she has not been home since her rib fx despite chart review showing pt d/c home 2 days prior to returning          Exercises Other Exercises Other Exercises: Pt ed re: importance of frequent OOB activity.   Shoulder Instructions       General Comments  Pertinent Vitals/ Pain       Pain Assessment: 0-10 Pain Score: 7  Pain Location: R ribs Pain Descriptors / Indicators: Aching;Sore;Tender Pain Intervention(s): Limited activity within patient's tolerance;Monitored during session;Repositioned  Home Living                                          Prior Functioning/Environment              Frequency  Min 2X/week        Progress Toward Goals  OT Goals(current goals can now be found in the care plan section)  Progress towards OT goals: OT to reassess next treatment (self-limiting)  Acute Rehab OT Goals Patient Stated Goal: to improve pain OT Goal Formulation: With patient Time For Goal Achievement: 09/01/21 Potential to Achieve Goals: Good  Plan Discharge plan remains appropriate;Frequency remains appropriate    Co-evaluation                 AM-PAC OT "6 Clicks" Daily Activity     Outcome Measure   Help from another person eating meals?: None Help  from another person taking care of personal grooming?: A Little Help from another person toileting, which includes using toliet, bedpan, or urinal?: A Lot Help from another person bathing (including washing, rinsing, drying)?: A Lot Help from another person to put on and taking off regular upper body clothing?: A Little Help from another person to put on and taking off regular lower body clothing?: A Lot 6 Click Score: 16    End of Session    OT Visit Diagnosis: Other abnormalities of gait and mobility (R26.89);Muscle weakness (generalized) (M62.81) Pain - Right/Left: Right   Activity Tolerance Patient limited by pain   Patient Left in bed;with call bell/phone within reach;with bed alarm set   Nurse Communication Mobility status        Time: 4314-2767 OT Time Calculation (min): 11 min  Charges: OT General Charges $OT Visit: 1 Visit OT Treatments $Self Care/Home Management : 8-22 mins  Gerrianne Scale, MS, OTR/L ascom [phone] 08/21/21, 5:29 PM

## 2021-08-22 DIAGNOSIS — J45901 Unspecified asthma with (acute) exacerbation: Secondary | ICD-10-CM | POA: Diagnosis not present

## 2021-08-22 DIAGNOSIS — J441 Chronic obstructive pulmonary disease with (acute) exacerbation: Secondary | ICD-10-CM | POA: Diagnosis not present

## 2021-08-22 NOTE — Progress Notes (Signed)
[facility]  - [place] at [facility]   PATIENT NAME: Bethany Villa    MR#:  [id number]  DATE OF BIRTH:  Oct 17, 1955  SUBJECTIVE:    REVIEW OF SYSTEMS:   ROS Tolerating Diet: Tolerating PT:   DRUG ALLERGIES:   Allergies  Allergen Reactions   Gabapentin Shortness Of Breath and Rash   Mobic [Meloxicam] Anaphylaxis   Penicillins Shortness Of Breath and Swelling    Tolerated Zosyn December 2022  Has patient had a PCN reaction causing immediate rash, facial/tongue/throat swelling, SOB or lightheadedness with hypotension: unknown Has patient had a PCN reaction causing severe rash involving mucus membranes or skin necrosis: {unknown Has patient had a PCN reaction that required hospitalization {unknown Has patient had a PCN reaction occurring within the last 10 years: no If all of the above answers are "NO", then may proceed with Cephalosporin use.   Sulfa Antibiotics Shortness Of Breath and Swelling    VITALS:  Blood pressure 103/65, pulse 83, temperature 98.9 F (37.2 C), temperature source Oral, resp. rate 16, height 5\' 4"  (1.626 m), weight 89.4 kg, SpO2 100 %.  PHYSICAL EXAMINATION:   Physical Exam  GENERAL:  65 y.o.-year-old patient lying in the bed with no acute distress.  HEENT: Head atraumatic, normocephalic. Oropharynx and nasopharynx clear.  LUNGS: Normal breath sounds bilaterally, no wheezing, rales, rhonchi. No use of accessory muscles of respiration.  CARDIOVASCULAR: S1, S2 normal. No murmurs, rubs, or gallops.  ABDOMEN: Soft, nontender, nondistended. Bowel sounds present. No organomegaly or mass.  EXTREMITIES: No cyanosis, clubbing or edema b/l.    NEUROLOGIC: nonfocal PSYCHIATRIC:  patient is alert and oriented x 3.  SKIN: No obvious rash, lesion, or ulcer.   LABORATORY PANEL:  CBC Recent Labs  Lab 08/20/21 1917  WBC 15.0*  HGB 12.6  HCT 40.3  PLT 202    Chemistries  Recent Labs  Lab 08/15/21 1542 08/15/21 1947 08/16/21 0411  08/21/21 0451  NA 136  --    < > 136  K 4.3  --    < > 4.3  CL 99  --    < > 95*  CO2 32  --    < > 36*  GLUCOSE 114*  --    < > 94  BUN 30*  --    < > 25*  CREATININE 1.27*  --    < > 0.74  CALCIUM 7.9*  --    < > 8.0*  MG  --  2.1  --   --   AST 51*  --   --   --   ALT 35  --   --   --   ALKPHOS 57  --   --   --   BILITOT 0.8  --   --   --    < > = values in this interval not displayed.   Cardiac Enzymes No results for input(s): TROPONINI in the last 168 hours. RADIOLOGY:  DG Chest Port 1 View  Result Date: 08/21/2021 CLINICAL DATA:  Shortness of breath EXAM: PORTABLE CHEST 1 VIEW COMPARISON:  08/15/2021 FINDINGS: The heart size and mediastinal contours are within normal limits. Chronic interstitial changes. Pulmonary vascular congestion. Left upper lung cavitary structure on the prior study is not identified. No pleural effusion or pneumothorax. Normal heart size. IMPRESSION: COPD.  Pulmonary vascular congestion.  No focal consolidation. Electronically Signed   By: 08/17/2021 M.D.   On: 08/21/2021 08:43   ASSESSMENT AND PLAN:  Bethany Ballard  Villa is a 65 y.o. female  with medical history significant for bedbugs infestation, COPD, chronic hypoxemic respiratory failure on 2 L/min oxygen at night, chronic diastolic CHF, suspected liver cirrhosis, tobacco use disorder, recent discharge from the hospital on 08/13/2021 for altered mental status, pneumonia, COPD exacerbation, rib fracture and respiratory failure.  She presented to the hospital with cough, shortness of breath, wheezing and lethargy.  She was initially treated with BiPAP for acute hypoxemic and hypercapnic respiratory failure.  She was weaned off of BiPAP to test per minute oxygen via nasal cannula.  Acute on chronic hypoxic respiratory failure secondary to acute on chronic COPD exacerbation -- patient followed by pulmonary. -- Currently sats well on 2 L nasal cannula oxygen. Tolerates room air during the day. -- Continue  bronchodilators -she completed steroids and antibiotics  acute on chronic diastolic heart failure -- improved  right chest wall pain suspect musculoskeletal -- patient has history of chronic pain and follows with the pain clinic -- chest x-ray no evidence of acute fractures. Patient has history of rib fracture  AKI: Improved   Hypotension: Hold losartan and monitor BP closely.   Patient will discharged to rehab on Monday discussed with TOC. Patient aware.   Consults : pulmonary--Dr aleskerov CODE STATUS: FULL DVT Prophylaxis :enoxparin Level of care: Telemetry Medical Status is: Inpatient  Remains inpatient appropriate because: rehab on Monday per TOC  D/c tele      TOTAL TIME TAKING CARE OF THIS PATIENT: 20 minutes.  >50% time spent on counselling and coordination of care  Note: This dictation was prepared with Dragon dictation along with smaller phrase technology. Any transcriptional errors that result from this process are unintentional.  Enedina Finner M.D    [facility]   CC: Primary care physician; [name], MD Patient ID: Bethany Villa, female   DOB: [birth year], 65 y.o.   MRN: [id number]

## 2021-08-22 NOTE — Plan of Care (Signed)
  Problem: Clinical Measurements: Goal: Ability to maintain clinical measurements within normal limits will improve Outcome: Progressing Goal: Will remain free from infection Outcome: Progressing Goal: Diagnostic test results will improve Outcome: Progressing Goal: Respiratory complications will improve Outcome: Progressing Goal: Cardiovascular complication will be avoided Outcome: Progressing   Problem: Pain Managment: Goal: General experience of comfort will improve Outcome: Progressing   

## 2021-08-23 DIAGNOSIS — J45901 Unspecified asthma with (acute) exacerbation: Secondary | ICD-10-CM | POA: Diagnosis not present

## 2021-08-23 DIAGNOSIS — J441 Chronic obstructive pulmonary disease with (acute) exacerbation: Secondary | ICD-10-CM | POA: Diagnosis not present

## 2021-08-23 NOTE — Progress Notes (Signed)
[facility]  - [place] at [facility]   PATIENT NAME: Bethany Villa    MR#:  [id number]  DATE OF BIRTH:  24-Sep-1955  SUBJECTIVE:   no new complaints. Has chronic pain.No family at bedside  REVIEW OF SYSTEMS:   Review of Systems  Constitutional:  Negative for chills, fever and weight loss.  HENT:  Negative for ear discharge, ear pain and nosebleeds.   Eyes:  Negative for blurred vision, pain and discharge.  Respiratory:  Positive for shortness of breath. Negative for sputum production, wheezing and stridor.   Cardiovascular:  Negative for chest pain, palpitations, orthopnea and PND.  Gastrointestinal:  Negative for abdominal pain, diarrhea, nausea and vomiting.  Genitourinary:  Negative for frequency and urgency.  Musculoskeletal:  Negative for back pain and joint pain.  Neurological:  Positive for weakness. Negative for sensory change, speech change and focal weakness.  Psychiatric/Behavioral:  Negative for depression and hallucinations. The patient is not nervous/anxious.   Tolerating Diet: yes Tolerating PT: rehab  DRUG ALLERGIES:   Allergies  Allergen Reactions   Gabapentin Shortness Of Breath and Rash   Mobic [Meloxicam] Anaphylaxis   Penicillins Shortness Of Breath and Swelling    Tolerated Zosyn December 2022  Has patient had a PCN reaction causing immediate rash, facial/tongue/throat swelling, SOB or lightheadedness with hypotension: unknown Has patient had a PCN reaction causing severe rash involving mucus membranes or skin necrosis: {unknown Has patient had a PCN reaction that required hospitalization {unknown Has patient had a PCN reaction occurring within the last 10 years: no If all of the above answers are "NO", then may proceed with Cephalosporin use.   Sulfa Antibiotics Shortness Of Breath and Swelling    VITALS:  Blood pressure 106/70, pulse 81, temperature (!) 97.5 F (36.4 C), temperature source Oral, resp. rate 19, height 5\' 4"  (1.626  m), weight 89.4 kg, SpO2 100 %.  PHYSICAL EXAMINATION:   Physical Exam  GENERAL:  65 y.o.-year-old patient lying in the bed with no acute distress. Chronically ill HEENT: Head atraumatic, normocephalic. Oropharynx and nasopharynx clear.  LUNGS: decreased breath sounds bilaterally, no wheezing, rales, rhonchi. No use of accessory muscles of respiration.  CARDIOVASCULAR: S1, S2 normal. No murmurs, rubs, or gallops.  ABDOMEN: Soft, nontender, nondistended. Bowel sounds present.  EXTREMITIES: No cyanosis, clubbing or edema b/l.    NEUROLOGIC: nonfocal PSYCHIATRIC:  patient is alert and oriented x 3.  SKIN: No obvious rash, lesion, or ulcer. Per Rn  LABORATORY PANEL:  CBC Recent Labs  Lab 08/20/21 1917  WBC 15.0*  HGB 12.6  HCT 40.3  PLT 202     Chemistries  Recent Labs  Lab 08/21/21 0451  NA 136  K 4.3  CL 95*  CO2 36*  GLUCOSE 94  BUN 25*  CREATININE 0.74  CALCIUM 8.0*    Cardiac Enzymes No results for input(s): TROPONINI in the last 168 hours. RADIOLOGY:  No results found. ASSESSMENT AND PLAN:  Bethany Villa is a 65 y.o. female  with medical history significant for bedbugs infestation, COPD, chronic hypoxemic respiratory failure on 2 L/min oxygen at night, chronic diastolic CHF, suspected liver cirrhosis, tobacco use disorder, recent discharge from the hospital on 08/13/2021 for altered mental status, pneumonia, COPD exacerbation, rib fracture and respiratory failure.  She presented to the hospital with cough, shortness of breath, wheezing and lethargy.  She was initially treated with BiPAP for acute hypoxemic and hypercapnic respiratory failure.  She was weaned off of BiPAP to test per minute oxygen  via nasal cannula.  Acute on chronic hypoxic respiratory failure secondary to acute on chronic COPD exacerbation -- patient followed by pulmonary. -- Currently sats well on 2 L nasal cannula oxygen. Tolerates room air during the day. -- Continue bronchodilators -she  completed steroids and antibiotics  acute on chronic diastolic heart failure -- improved  right chest wall pain suspect musculoskeletal -- patient has history of chronic pain and follows with the pain clinic -- chest x-ray no evidence of acute fractures. Patient has history of rib fracture  AKI: Improved   Hypotension: Hold losartan and monitor BP closely.   Patient will discharged to rehab on Monday discussed with TOC. Patient aware.   Consults : pulmonary--Dr aleskerov CODE STATUS: FULL DVT Prophylaxis :enoxparin Level of care: Telemetry Medical Status is: Inpatient  Remains inpatient appropriate because: rehab on Monday per TOC  D/c tele      TOTAL TIME TAKING CARE OF THIS PATIENT: 20 minutes.  >50% time spent on counselling and coordination of care  Note: This dictation was prepared with Dragon dictation along with smaller phrase technology. Any transcriptional errors that result from this process are unintentional.  Enedina Finner M.D    [facility]   CC: Primary care physician; [name], MD Patient ID: Bethany Villa, female   DOB: October 10, 1955, 65 y.o.   MRN: [id number]

## 2021-08-23 NOTE — Plan of Care (Signed)
Patient alert continues on O2 at 2 liters via nasal cannula. PRN pain medications given for pain control.  Problem: Education: Goal: Knowledge of General Education information will improve Description: Including pain rating scale, medication(s)/side effects and non-pharmacologic comfort measures Outcome: Progressing   Problem: Health Behavior/Discharge Planning: Goal: Ability to manage health-related needs will improve Outcome: Progressing   Problem: Clinical Measurements: Goal: Ability to maintain clinical measurements within normal limits will improve Outcome: Progressing Goal: Will remain free from infection Outcome: Progressing Goal: Diagnostic test results will improve Outcome: Progressing Goal: Respiratory complications will improve Outcome: Progressing Goal: Cardiovascular complication will be avoided Outcome: Progressing   Problem: Activity: Goal: Risk for activity intolerance will decrease Outcome: Progressing   Problem: Nutrition: Goal: Adequate nutrition will be maintained Outcome: Progressing   Problem: Coping: Goal: Level of anxiety will decrease Outcome: Progressing   Problem: Elimination: Goal: Will not experience complications related to bowel motility Outcome: Progressing Goal: Will not experience complications related to urinary retention Outcome: Progressing   Problem: Pain Managment: Goal: General experience of comfort will improve Outcome: Progressing   Problem: Safety: Goal: Ability to remain free from injury will improve Outcome: Progressing   Problem: Skin Integrity: Goal: Risk for impaired skin integrity will decrease Outcome: Progressing

## 2021-08-24 DIAGNOSIS — J45901 Unspecified asthma with (acute) exacerbation: Secondary | ICD-10-CM | POA: Diagnosis not present

## 2021-08-24 DIAGNOSIS — J441 Chronic obstructive pulmonary disease with (acute) exacerbation: Secondary | ICD-10-CM | POA: Diagnosis not present

## 2021-08-24 MED ORDER — LACTULOSE 10 GM/15ML PO SOLN: 20.0000 g | Freq: Two times a day (BID) | ORAL | Status: DC | PRN

## 2021-08-24 MED ORDER — OXYCODONE HCL 5 MG PO TABS: 5.0000 mg | ORAL_TABLET | Freq: Three times a day (TID) | ORAL | 0 refills | Status: DC | PRN

## 2021-08-24 MED ORDER — ZOLPIDEM TARTRATE 5 MG PO TABS
5.0000 mg | ORAL_TABLET | Freq: Every evening | ORAL | Status: DC | PRN
  Administered 2021-08-24: 23:00:00 5 mg via ORAL
  Filled 2021-08-24: qty 1

## 2021-08-24 MED ORDER — DOCUSATE SODIUM 100 MG PO CAPS
100.0000 mg | ORAL_CAPSULE | Freq: Two times a day (BID) | ORAL | 0 refills | Status: DC | PRN
Start: 2021-08-24 — End: 2021-10-26

## 2021-08-24 MED ORDER — ALBUTEROL SULFATE (2.5 MG/3ML) 0.083% IN NEBU: 2.5000 mg | INHALATION_SOLUTION | RESPIRATORY_TRACT | 12 refills | Status: AC | PRN

## 2021-08-24 NOTE — Progress Notes (Addendum)
[facility]  - [place] at [facility]   PATIENT NAME: Bethany Villa    MR#:  [id number]  DATE OF BIRTH:  1956/03/28  SUBJECTIVE:   no new complaints. Has chronic pain.No family at bedside  REVIEW OF SYSTEMS:   Review of Systems  Constitutional:  Negative for chills, fever and weight loss.  HENT:  Negative for ear discharge, ear pain and nosebleeds.   Eyes:  Negative for blurred vision, pain and discharge.  Respiratory:  Positive for shortness of breath. Negative for sputum production, wheezing and stridor.   Cardiovascular:  Negative for chest pain, palpitations, orthopnea and PND.  Gastrointestinal:  Negative for abdominal pain, diarrhea, nausea and vomiting.  Genitourinary:  Negative for frequency and urgency.  Musculoskeletal:  Negative for back pain and joint pain.  Neurological:  Positive for weakness. Negative for sensory change, speech change and focal weakness.  Psychiatric/Behavioral:  Negative for depression and hallucinations. The patient is not nervous/anxious.   Tolerating Diet: yes Tolerating PT: rehab  DRUG ALLERGIES:   Allergies  Allergen Reactions   Gabapentin Shortness Of Breath and Rash   Mobic [Meloxicam] Anaphylaxis   Penicillins Shortness Of Breath and Swelling    Tolerated Zosyn December 2022  Has patient had a PCN reaction causing immediate rash, facial/tongue/throat swelling, SOB or lightheadedness with hypotension: unknown Has patient had a PCN reaction causing severe rash involving mucus membranes or skin necrosis: {unknown Has patient had a PCN reaction that required hospitalization {unknown Has patient had a PCN reaction occurring within the last 10 years: no If all of the above answers are "NO", then may proceed with Cephalosporin use.   Sulfa Antibiotics Shortness Of Breath and Swelling    VITALS:  Blood pressure 114/69, pulse 89, temperature 97.6 F (36.4 C), temperature source Oral, resp. rate 20, height 5\' 4"  (1.626 m),  weight 90.4 kg, SpO2 99 %.  PHYSICAL EXAMINATION:   Physical Exam  GENERAL:  65 y.o.-year-old patient lying in the bed with no acute distress. Chronically ill HEENT: Head atraumatic, normocephalic. Oropharynx and nasopharynx clear.  LUNGS: decreased breath sounds bilaterally, no wheezing, rales, rhonchi. No use of accessory muscles of respiration.  CARDIOVASCULAR: S1, S2 normal. No murmurs, rubs, or gallops.  ABDOMEN: Soft, nontender, nondistended. Bowel sounds present.  EXTREMITIES: No cyanosis, clubbing or edema b/l.    NEUROLOGIC: nonfocal PSYCHIATRIC:  patient is alert and oriented x 3.  SKIN: No obvious rash, lesion, or ulcer. Per Rn  LABORATORY PANEL:  CBC Recent Labs  Lab 08/20/21 1917  WBC 15.0*  HGB 12.6  HCT 40.3  PLT 202     Chemistries  Recent Labs  Lab 08/21/21 0451  NA 136  K 4.3  CL 95*  CO2 36*  GLUCOSE 94  BUN 25*  CREATININE 0.74  CALCIUM 8.0*    Cardiac Enzymes No results for input(s): TROPONINI in the last 168 hours. RADIOLOGY:  No results found. ASSESSMENT AND PLAN:  Bethany Villa is a 65 y.o. female  with medical history significant for bedbugs infestation, COPD, chronic hypoxemic respiratory failure on 2 L/min oxygen at night, chronic diastolic CHF, suspected liver cirrhosis, tobacco use disorder, recent discharge from the hospital on 08/13/2021 for altered mental status, pneumonia, COPD exacerbation, rib fracture and respiratory failure.  She presented to the hospital with cough, shortness of breath, wheezing and lethargy.  She was initially treated with BiPAP for acute hypoxemic and hypercapnic respiratory failure.  She was weaned off of BiPAP to test per minute oxygen via  nasal cannula.  Acute on chronic hypoxic respiratory failure secondary to acute on chronic COPD exacerbation -- patient followed by pulmonary. -- Currently sats well on 2 L nasal cannula oxygen. Tolerates room air during the day. -- Continue bronchodilators -she  completed steroids and antibiotics  acute on chronic diastolic heart failure -- improved  right chest wall pain suspect musculoskeletal -- patient has history of chronic pain and follows with the pain clinic -- chest x-ray no evidence of acute fractures. Patient has history of rib fracture  AKI: Improved   Hypotension: Hold losartan and monitor BP closely.   Per TOC she is not able to find the insured authorization.. 2. has been done by Dr. Myriam Forehand on 22nd and from the note it says patient has been accepted for rehab.  Consults : pulmonary--Dr aleskerov CODE STATUS: FULL DVT Prophylaxis :enoxparin Level of care: Telemetry Medical Status is: Inpatient  patient is ready for discharge however per [facility] waiting for insurance authorization. She will let me know.  Addendum: Informed by TOC that [facility] will NOT take pts today!    TOTAL TIME TAKING CARE OF THIS PATIENT: 20 minutes.  >50% time spent on counselling and coordination of care  Note: This dictation was prepared with Dragon dictation along with smaller phrase technology. Any transcriptional errors that result from this process are unintentional.  Enedina Finner M.D    [facility]   CC: Primary care physician; Bethany Paradise, MD Patient ID: Bethany Villa, female   DOB: [birth year], 65 y.o.   MRN: [id number]

## 2021-08-24 NOTE — TOC Progression Note (Addendum)
Transition of Care [facility]) - Progression Note    Patient Details  Name: Bethany Villa MRN: [id number] Date of Birth: [birth year]  [facility] CM/SW Contact  Joseph Art, [place] Phone Number: 08/24/2021, 10:18 AM  Clinical Narrative:     Planned d/c for [facility], for 08/25/2021. Ins auth # [id number] for SNF, dates  08/21/2021-08/26/2021.  Gavin Pound from [facility] confirmed they will not take new admits today but will accept them starting tomorrow 08/25/2021. Attending updated.    Expected Discharge Plan: Skilled Nursing Facility Barriers to Discharge: Continued Medical Work up  Expected Discharge Plan and Services Expected Discharge Plan: Skilled Nursing Facility       Living arrangements for the past 2 months: Single Family Home Expected Discharge Date: 08/24/21                                     Social Determinants of Health (SDOH) Interventions    Readmission Risk Interventions Readmission Risk Prevention Plan 08/18/2021  Transportation Screening Complete  HRI or Home Care Consult Complete  Social Work Consult for Recovery Care Planning/Counseling Complete  Palliative Care Screening Not Applicable  Medication Review [employer]) Complete  Some recent data might be hidden

## 2021-08-24 NOTE — Care Management Important Message (Signed)
Important Message  Patient Details  Name: Bethany Villa MRN: [id number] Date of Birth: 1956-06-14   Medicare Important Message Given:  Yes     [name] 08/24/2021, 12:15 PM

## 2021-08-25 DIAGNOSIS — J45901 Unspecified asthma with (acute) exacerbation: Secondary | ICD-10-CM | POA: Diagnosis not present

## 2021-08-25 DIAGNOSIS — J441 Chronic obstructive pulmonary disease with (acute) exacerbation: Secondary | ICD-10-CM | POA: Diagnosis not present

## 2021-08-25 LAB — RESP PANEL BY RT-PCR (FLU A&B, COVID) ARPGX2
Influenza A by PCR: NEGATIVE
Influenza B by PCR: NEGATIVE
SARS Coronavirus 2 by RT PCR: NEGATIVE

## 2021-08-25 NOTE — TOC Transition Note (Signed)
Transition of Care [facility]) - CM/SW Discharge Note   Patient Details  Name: AVIAH SORCI MRN: [id number] Date of Birth: 03/03/1956  Transition of Care [facility]) CM/SW Contact:  Chapman Fitch, RN Phone Number: 08/25/2021, 10:22 AM   Clinical Narrative:      Patient will DC to:[facility] Anticipated DC date: 08/25/21  Family notified:VM left for son Transport by: [facility]  Per MD patient ready for DC to . RN, patient, patient's family, and facility notified of DC. Discharge Summary sent to facility. RN given number for report. DC packet on chart. Bedside RN not ready for EMS to be called at this time.  She is to call EMS when ready   TOC signing off.  Bevelyn Ngo [facility] [phone]  Final next level of care: Skilled Nursing Facility Barriers to Discharge: Barriers Resolved   Patient Goals and CMS Choice        Discharge Placement                       Discharge Plan and Services                                     Social Determinants of Health (SDOH) Interventions     Readmission Risk Interventions Readmission Risk Prevention Plan 08/18/2021  Transportation Screening Complete  HRI or Home Care Consult Complete  Social Work Consult for Recovery Care Planning/Counseling Complete  Palliative Care Screening Not Applicable  Medication Review Oceanographer) Complete  Some recent data might be hidden

## 2021-08-25 NOTE — Discharge Summary (Signed)
[facility] [place] at [facility]   PATIENT NAME: Bethany Villa    MR#:  [id number]  DATE OF BIRTH:  1956/07/21  DATE OF ADMISSION:  08/15/2021 ADMITTING PHYSICIAN: Tresa Moore, MD  DATE OF DISCHARGE: 08/25/2021  PRIMARY CARE PHYSICIAN: [name], MD    ADMISSION DIAGNOSIS:  Acute exacerbation of COPD with asthma (HCC) [J44.1, J45.901]  DISCHARGE DIAGNOSIS:  Acute exacerbation of COPD with Asthma  SECONDARY DIAGNOSIS:   Past Medical History:  Diagnosis Date   Anxiety    Asthma    Chronic pain    lower back   Chronic systolic CHF (congestive heart failure) (HCC) 09/22/2019   COPD (chronic obstructive pulmonary disease) (HCC)    Depression    DJD (degenerative joint disease)    Fibromyalgia    GERD (gastroesophageal reflux disease)    Headache    Hyperlipidemia    Lower extremity edema    On home oxygen therapy    has not been on since 2016   Panic attack    Shortness of breath dyspnea    Sleep disorder     HOSPITAL COURSE:   Bethany Villa is a 65 y.o. female  with medical history significant for bedbugs infestation, COPD, chronic hypoxemic respiratory failure on 2 L/min oxygen at night, chronic diastolic CHF, suspected liver cirrhosis, tobacco use disorder, recent discharge from the hospital on 08/13/2021 for altered mental status, pneumonia, COPD exacerbation, rib fracture and respiratory failure.  She presented to the hospital with cough, shortness of breath, wheezing and lethargy.  She was initially treated with BiPAP for acute hypoxemic and hypercapnic respiratory failure.  She was weaned off of BiPAP to test per minute oxygen via nasal cannula.   Acute on chronic hypoxic respiratory failure secondary to acute on chronic COPD exacerbation -- patient followed by pulmonary. -- Currently sats well on 2 L nasal cannula oxygen. Tolerates room air during the day. -- Continue bronchodilators --she completed steroids  and antibiotics   acute on chronic diastolic heart failure -- improved   right chest wall pain suspect musculoskeletal -- patient has history of chronic pain and follows with the pain clinic -- chest x-ray no evidence of acute fractures. Patient has history of rib fracture   Hypotension: Hold losartan and monitor BP closely.   Per TOC as of 12/26--Insurance Berkley Harvey is done   Consults : pulmonary--Dr aleskerov CODE STATUS: FULL DVT Prophylaxis :enoxparin Level of care: Telemetry Medical Status is: Inpatient   patient is ready for discharge tod WOM today COVID ordered CONSULTS OBTAINED:  Treatment Team:  Vida Rigger, MD  DRUG ALLERGIES:   Allergies  Allergen Reactions   Gabapentin Shortness Of Breath and Rash   Mobic [Meloxicam] Anaphylaxis   Penicillins Shortness Of Breath and Swelling    Tolerated Zosyn December 2022  Has patient had a PCN reaction causing immediate rash, facial/tongue/throat swelling, SOB or lightheadedness with hypotension: unknown Has patient had a PCN reaction causing severe rash involving mucus membranes or skin necrosis: {unknown Has patient had a PCN reaction that required hospitalization {unknown Has patient had a PCN reaction occurring within the last 10 years: no If all of the above answers are "NO", then may proceed with Cephalosporin use.   Sulfa Antibiotics Shortness Of Breath and Swelling    DISCHARGE MEDICATIONS:   Allergies as of 08/25/2021       Reactions   Gabapentin Shortness Of Breath, Rash   Mobic [meloxicam] Anaphylaxis   Penicillins Shortness Of Breath, Swelling  Tolerated Zosyn December 2022 Has patient had a PCN reaction causing immediate rash, facial/tongue/throat swelling, SOB or lightheadedness with hypotension: unknown Has patient had a PCN reaction causing severe rash involving mucus membranes or skin necrosis: {unknown Has patient had a PCN reaction that required hospitalization {unknown Has patient had a PCN  reaction occurring within the last 10 years: no If all of the above answers are "NO", then may proceed with Cephalosporin use.   Sulfa Antibiotics Shortness Of Breath, Swelling        Medication List     STOP taking these medications    buprenorphine 7.5 MCG/HR Commonly known as: Butrans   levofloxacin 750 MG tablet Commonly known as: Levaquin   losartan 50 MG tablet Commonly known as: COZAAR       TAKE these medications    albuterol (2.5 MG/3ML) 0.083% nebulizer solution Commonly known as: PROVENTIL Take 3 mLs (2.5 mg total) by nebulization every 2 (two) hours as needed for shortness of breath or wheezing.   clopidogrel 75 MG tablet Commonly known as: Plavix Take 1 tablet (75 mg total) by mouth daily.   docusate sodium 100 MG capsule Commonly known as: COLACE Take 1 capsule (100 mg total) by mouth 2 (two) times daily as needed for mild constipation.   guaiFENesin-dextromethorphan 100-10 MG/5ML syrup Commonly known as: ROBITUSSIN DM Take 5 mLs by mouth every 4 (four) hours as needed for cough.   lactulose 10 GM/15ML solution Commonly known as: CHRONULAC Take 30 mLs (20 g total) by mouth 2 (two) times daily.   lidocaine 5 % Commonly known as: LIDODERM Place 1 patch onto the skin daily. Remove & Discard patch within 12 hours or as directed by MD   oxyCODONE 5 MG immediate release tablet Commonly known as: Oxy IR/ROXICODONE Take 1 tablet (5 mg total) by mouth 3 (three) times daily as needed for moderate pain.   pantoprazole 40 MG tablet Commonly known as: PROTONIX Take 1 tablet (40 mg total) by mouth daily.        If you experience worsening of your admission symptoms, develop shortness of breath, life threatening emergency, suicidal or homicidal thoughts you must seek medical attention immediately by calling 911 or calling your MD immediately  if symptoms less severe.  You Must read complete instructions/literature along with all the possible adverse  reactions/side effects for all the Medicines you take and that have been prescribed to you. Take any new Medicines after you have completely understood and accept all the possible adverse reactions/side effects.   Please note  You were cared for by a hospitalist during your hospital stay. If you have any questions about your discharge medications or the care you received while you were in the hospital after you are discharged, you can call the unit and asked to speak with the hospitalist on call if the hospitalist that took care of you is not available. Once you are discharged, your primary care physician will handle any further medical issues. Please note that NO REFILLS for any discharge medications will be authorized once you are discharged, as it is imperative that you return to your primary care physician (or establish a relationship with a primary care physician if you do not have one) for your aftercare needs so that they can reassess your need for medications and monitor your lab values. Today   SUBJECTIVE  Doing ok   VITAL SIGNS:  Blood pressure 112/68, pulse 98, temperature 98 F (36.7 C), temperature source Oral, resp. rate 18,  height  (1.626 m), weight 87.6 kg, SpO2 100 %.  I/O:   Intake/Output Summary (Last 24 hours) at 08/25/2021 0847 Last data filed at 08/24/2021 1850 Gross per 24 hour  Intake 720 ml  Output --  Net 720 ml    PHYSICAL EXAMINATION:  GENERAL:  65 y.o.-year-old patient lying in the bed with no acute distress.  LUNGS: Normal breath sounds bilaterally, no wheezing, rales,rhonchi or crepitation. No use of accessory muscles of respiration.  CARDIOVASCULAR: S1, S2 normal. No murmurs, rubs, or gallops.  ABDOMEN: Soft, non-tender, non-distended. Bowel sounds present. No organomegaly or mass.  EXTREMITIES: No pedal edema, cyanosis, or clubbing.  NEUROLOGIC: non-focal PSYCHIATRIC:  patient is alert and oriented x 3.  SKIN: No obvious rash, lesion, or ulcer.    DATA REVIEW:   CBC  Recent Labs  Lab 08/20/21 1917  WBC 15.0*  HGB 12.6  HCT 40.3  PLT 202    Chemistries  Recent Labs  Lab 08/21/21 0451  NA 136  K 4.3  CL 95*  CO2 36*  GLUCOSE 94  BUN 25*  CREATININE 0.74  CALCIUM 8.0*    Microbiology Results   Recent Results (from the past 240 hour(s))  Resp Panel by RT-PCR (Flu A&B, Covid) Nasopharyngeal Swab     Status: None   Collection Time: 08/15/21  3:42 PM   Specimen: Nasopharyngeal Swab; Nasopharyngeal(NP) swabs in vial transport medium  Result Value Ref Range Status   SARS Coronavirus 2 by RT PCR NEGATIVE NEGATIVE Final    Comment: (NOTE) SARS-CoV-2 target nucleic acids are NOT DETECTED.  The SARS-CoV-2 RNA is generally detectable in upper respiratory specimens during the acute phase of infection. The lowest concentration of SARS-CoV-2 viral copies this assay can detect is 138 copies/mL. A negative result does not preclude SARS-Cov-2 infection and should not be used as the sole basis for treatment or other patient management decisions. A negative result may occur with  improper specimen collection/handling, submission of specimen other than nasopharyngeal swab, presence of viral mutation(s) within the areas targeted by this assay, and inadequate number of viral copies(<138 copies/mL). A negative result must be combined with clinical observations, patient history, and epidemiological information. The expected result is Negative.  Fact Sheet for Patients:  BloggerCourse.com  Fact Sheet for Healthcare Providers:  SeriousBroker.it  This test is no t yet approved or cleared by the [place] FDA and  has been authorized for detection and/or diagnosis of SARS-CoV-2 by FDA under an Emergency Use Authorization (EUA). This EUA will remain  in effect (meaning this test can be used) for the duration of the COVID-19 declaration under Section 564(b)(1) of the Act,  21 U.S.C.section 360bbb-3(b)(1), unless the authorization is terminated  or revoked sooner.       Influenza A by PCR NEGATIVE NEGATIVE Final   Influenza B by PCR NEGATIVE NEGATIVE Final    Comment: (NOTE) The Xpert Xpress SARS-CoV-2/FLU/RSV plus assay is intended as an aid in the diagnosis of influenza from Nasopharyngeal swab specimens and should not be used as a sole basis for treatment. Nasal washings and aspirates are unacceptable for Xpert Xpress SARS-CoV-2/FLU/RSV testing.  Fact Sheet for Patients: BloggerCourse.com  Fact Sheet for Healthcare Providers: SeriousBroker.it  This test is not yet approved or cleared by the [place] FDA and has been authorized for detection and/or diagnosis of SARS-CoV-2 by FDA under an Emergency Use Authorization (EUA). This EUA will remain in effect (meaning this test can be used) for the duration of the COVID-19  declaration under Section 564(b)(1) of the Act, 21 U.S.C. section 360bbb-3(b)(1), unless the authorization is terminated or revoked.  Performed at [facility], [address] [place], [place] [address]   Blood Culture (routine x 2)     Status: None   Collection Time: 08/15/21  3:42 PM   Specimen: BLOOD  Result Value Ref Range Status   Specimen Description BLOOD RIGHT [facility]  Final   Special Requests   Final    BOTTLES DRAWN AEROBIC AND ANAEROBIC Blood Culture adequate volume   Culture   Final    NO GROWTH 5 DAYS Performed at [facility], [address] [place], [place] [address]    Report Status 08/20/2021 FINAL  Final  Blood Culture (routine x 2)     Status: None   Collection Time: 08/15/21  3:42 PM   Specimen: BLOOD  Result Value Ref Range Status   Specimen Description BLOOD RIGHT WRIST  Final   Special Requests   Final    BOTTLES DRAWN AEROBIC AND ANAEROBIC Blood Culture adequate volume   Culture   Final    NO GROWTH 5 DAYS Performed at  [facility], [address] [place], [place] [address]    Report Status 08/20/2021 FINAL  Final  Respiratory (~20 pathogens) panel by PCR     Status: None   Collection Time: 08/15/21  7:40 PM   Specimen: Nasopharyngeal Swab; Respiratory  Result Value Ref Range Status   Adenovirus NOT DETECTED NOT DETECTED Final   Coronavirus 229E NOT DETECTED NOT DETECTED Final    Comment: (NOTE) The Coronavirus on the Respiratory Panel, DOES NOT test for the novel  Coronavirus (2019 nCoV)    Coronavirus HKU1 NOT DETECTED NOT DETECTED Final   Coronavirus NL63 NOT DETECTED NOT DETECTED Final   Coronavirus OC43 NOT DETECTED NOT DETECTED Final   Metapneumovirus NOT DETECTED NOT DETECTED Final   Rhinovirus / Enterovirus NOT DETECTED NOT DETECTED Final   Influenza A NOT DETECTED NOT DETECTED Final   Influenza B NOT DETECTED NOT DETECTED Final   Parainfluenza Virus 1 NOT DETECTED NOT DETECTED Final   Parainfluenza Virus 2 NOT DETECTED NOT DETECTED Final   Parainfluenza Virus 3 NOT DETECTED NOT DETECTED Final   Parainfluenza Virus 4 NOT DETECTED NOT DETECTED Final   Respiratory Syncytial Virus NOT DETECTED NOT DETECTED Final   Bordetella pertussis NOT DETECTED NOT DETECTED Final   Bordetella Parapertussis NOT DETECTED NOT DETECTED Final   Chlamydophila pneumoniae NOT DETECTED NOT DETECTED Final   Mycoplasma pneumoniae NOT DETECTED NOT DETECTED Final    Comment: Performed at [facility], [address], [place], [place] [address]  CULTURE, BLOOD (ROUTINE X 2) w Reflex to ID Panel     Status: None   Collection Time: 08/15/21  7:47 PM   Specimen: BLOOD  Result Value Ref Range Status   Specimen Description BLOOD BLOOD LEFT HAND  Final   Special Requests   Final    BOTTLES DRAWN AEROBIC ONLY Blood Culture results may not be optimal due to an inadequate volume of blood received in culture bottles   Culture   Final    NO GROWTH 5 DAYS Performed at [facility], [address] [place], [place] [address]    Report Status 08/20/2021 FINAL  Final  CULTURE, BLOOD (ROUTINE X 2) w Reflex to ID Panel     Status: None   Collection Time: 08/15/21  7:47 PM   Specimen: BLOOD  Result Value Ref  Range Status   Specimen Description BLOOD BLOOD LEFT HAND  Final   Special Requests   Final    BOTTLES DRAWN AEROBIC ONLY Blood Culture results may not be optimal due to an inadequate volume of blood received in culture bottles   Culture   Final    NO GROWTH 5 DAYS Performed at [facility], [address], [place], [place] [address]    Report Status 08/20/2021 FINAL  Final  MRSA Next Gen by PCR, Nasal     Status: None   Collection Time: 08/15/21  9:20 PM   Specimen: Nasal Mucosa; Nasal Swab  Result Value Ref Range Status   MRSA by PCR Next Gen NOT DETECTED NOT DETECTED Final    Comment: (NOTE) The GeneXpert MRSA Assay (FDA approved for NASAL specimens only), is one component of a comprehensive MRSA colonization surveillance program. It is not intended to diagnose MRSA infection nor to guide or monitor treatment for MRSA infections. Test performance is not FDA approved in patients less than 11 years old. Performed at [facility], [address] [place], [place] [address]     RADIOLOGY:  No results found.   CODE STATUS:     Code Status Orders  (From admission, onward)           Start     Ordered   08/15/21 1818  Full code  Continuous        08/15/21 1818           Code Status History     Date Active Date Inactive Code Status Order ID Comments User Context   08/10/2021 2237 08/13/2021 2343 Full Code [id number]  Andris Baumann, MD ED   06/10/2020 2341 06/18/2020 0115 Full Code [id number]  Mansy, Vernetta Honey, MD ED   10/07/2019 1747 10/11/2019 1825 Full Code [id number]  Arnetha Courser, MD ED   09/21/2019 2355 09/26/2019 0052 Full Code [id number]  Andris Baumann, MD ED   06/11/2019 0102 06/13/2019 1641 Full Code [id number]  Mansy, Vernetta Honey, MD ED    10/01/2017 0223 10/08/2017 0338 Full Code [id number]  Ihor Austin, MD Inpatient   09/01/2016 2005 09/07/2016 1808 Full Code [id number]  Jonah Blue, MD Inpatient   04/14/2016 1151 04/15/2016 1423 Full Code [id number]  Deeann Saint, MD Inpatient   07/24/2014 0956 07/28/2014 2147 Full Code [id number]  Serita Pia, MD Inpatient   09/29/2013 2203 10/03/2013 1954 Full Code [id number]  Osvaldo Shipper, MD Inpatient   05/05/2013 1732 05/08/2013 1419 Full Code [id number]  Wilson Singer, MD ED   11/27/2012 1955 12/06/2012 1857 Full Code [id number]  Erick Blinks, MD Inpatient   10/03/2011 1701 10/08/2011 1508 Full Code [id number]  Erick Blinks, MD Inpatient   08/09/2011 2118 08/11/2011 1341 Full Code [id number]  Tarry Kos A Inpatient   05/19/2011 2239 05/23/2011 1646 Full Code [id number]  Amie Critchley, Tammy C Inpatient        TOTAL TIME TAKING CARE OF THIS PATIENT: 40 minutes.    Enedina Finner M.D  [facility]    CC: Primary care physician; [name], MD

## 2021-08-25 NOTE — Progress Notes (Signed)
Patient sent out via EMS with belongings

## 2021-09-03 ENCOUNTER — Encounter: Payer: Self-pay | Admitting: Internal Medicine

## 2021-09-03 ENCOUNTER — Emergency Department: Payer: Medicare Other

## 2021-09-03 ENCOUNTER — Inpatient Hospital Stay
Admission: EM | Admit: 2021-09-03 | Discharge: 2021-09-08 | DRG: 189 | Disposition: A | Discharge status: Skilled Nursing Facility | Payer: Medicare Other | Source: Skilled Nursing Facility | Attending: Internal Medicine | Admitting: Internal Medicine

## 2021-09-03 ENCOUNTER — Observation Stay: Payer: Medicare Other

## 2021-09-03 DIAGNOSIS — L89896 Pressure-induced deep tissue damage of other site: Secondary | ICD-10-CM | POA: Diagnosis present

## 2021-09-03 DIAGNOSIS — G894 Chronic pain syndrome: Secondary | ICD-10-CM | POA: Diagnosis present

## 2021-09-03 DIAGNOSIS — Z882 Allergy status to sulfonamides status: Secondary | ICD-10-CM

## 2021-09-03 DIAGNOSIS — F17200 Nicotine dependence, unspecified, uncomplicated: Secondary | ICD-10-CM | POA: Diagnosis present

## 2021-09-03 DIAGNOSIS — R000 Tachycardia, unspecified: Secondary | ICD-10-CM | POA: Diagnosis present

## 2021-09-03 DIAGNOSIS — J9602 Acute respiratory failure with hypercapnia: Secondary | ICD-10-CM

## 2021-09-03 DIAGNOSIS — J9621 Acute and chronic respiratory failure with hypoxia: Principal | ICD-10-CM | POA: Diagnosis present

## 2021-09-03 DIAGNOSIS — M797 Fibromyalgia: Secondary | ICD-10-CM | POA: Diagnosis present

## 2021-09-03 DIAGNOSIS — L89316 Pressure-induced deep tissue damage of right buttock: Secondary | ICD-10-CM | POA: Diagnosis present

## 2021-09-03 DIAGNOSIS — J449 Chronic obstructive pulmonary disease, unspecified: Secondary | ICD-10-CM | POA: Diagnosis present

## 2021-09-03 DIAGNOSIS — E785 Hyperlipidemia, unspecified: Secondary | ICD-10-CM | POA: Diagnosis present

## 2021-09-03 DIAGNOSIS — F17210 Nicotine dependence, cigarettes, uncomplicated: Secondary | ICD-10-CM | POA: Diagnosis present

## 2021-09-03 DIAGNOSIS — Z880 Allergy status to penicillin: Secondary | ICD-10-CM

## 2021-09-03 DIAGNOSIS — F0394 Unspecified dementia, unspecified severity, with anxiety: Secondary | ICD-10-CM | POA: Diagnosis present

## 2021-09-03 DIAGNOSIS — J9601 Acute respiratory failure with hypoxia: Secondary | ICD-10-CM | POA: Diagnosis not present

## 2021-09-03 DIAGNOSIS — J9691 Respiratory failure, unspecified with hypoxia: Secondary | ICD-10-CM | POA: Diagnosis present

## 2021-09-03 DIAGNOSIS — Z888 Allergy status to other drugs, medicaments and biological substances status: Secondary | ICD-10-CM

## 2021-09-03 DIAGNOSIS — Z9981 Dependence on supplemental oxygen: Secondary | ICD-10-CM

## 2021-09-03 DIAGNOSIS — R7989 Other specified abnormal findings of blood chemistry: Secondary | ICD-10-CM | POA: Diagnosis present

## 2021-09-03 DIAGNOSIS — L89326 Pressure-induced deep tissue damage of left buttock: Secondary | ICD-10-CM | POA: Diagnosis present

## 2021-09-03 DIAGNOSIS — R413 Other amnesia: Secondary | ICD-10-CM | POA: Insufficient documentation

## 2021-09-03 DIAGNOSIS — I5022 Chronic systolic (congestive) heart failure: Secondary | ICD-10-CM | POA: Diagnosis present

## 2021-09-03 DIAGNOSIS — G4733 Obstructive sleep apnea (adult) (pediatric): Secondary | ICD-10-CM | POA: Diagnosis present

## 2021-09-03 DIAGNOSIS — J441 Chronic obstructive pulmonary disease with (acute) exacerbation: Secondary | ICD-10-CM | POA: Diagnosis present

## 2021-09-03 DIAGNOSIS — F410 Panic disorder [episodic paroxysmal anxiety] without agoraphobia: Secondary | ICD-10-CM | POA: Diagnosis present

## 2021-09-03 DIAGNOSIS — Z91199 Patient's noncompliance with other medical treatment and regimen due to unspecified reason: Secondary | ICD-10-CM

## 2021-09-03 DIAGNOSIS — J9622 Acute and chronic respiratory failure with hypercapnia: Secondary | ICD-10-CM | POA: Diagnosis present

## 2021-09-03 DIAGNOSIS — Z20822 Contact with and (suspected) exposure to covid-19: Secondary | ICD-10-CM | POA: Diagnosis present

## 2021-09-03 DIAGNOSIS — Z886 Allergy status to analgesic agent status: Secondary | ICD-10-CM

## 2021-09-03 LAB — CBC WITH DIFFERENTIAL/PLATELET
Abs Immature Granulocytes: 0.03 10*3/uL (ref 0.00–0.07)
Basophils Absolute: 0 10*3/uL (ref 0.0–0.1)
Basophils Relative: 0 %
Eosinophils Absolute: 0.1 10*3/uL (ref 0.0–0.5)
Eosinophils Relative: 2 %
HCT: 38 % (ref 36.0–46.0)
Hemoglobin: 11.7 g/dL — ABNORMAL LOW (ref 12.0–15.0)
Immature Granulocytes: 1 %
Lymphocytes Relative: 17 %
Lymphs Abs: 1 10*3/uL (ref 0.7–4.0)
MCH: 29.3 pg (ref 26.0–34.0)
MCHC: 30.8 g/dL (ref 30.0–36.0)
MCV: 95.2 fL (ref 80.0–100.0)
Monocytes Absolute: 0.4 10*3/uL (ref 0.1–1.0)
Monocytes Relative: 7 %
Neutro Abs: 4.2 10*3/uL (ref 1.7–7.7)
Neutrophils Relative %: 73 %
Platelets: 185 10*3/uL (ref 150–400)
RBC: 3.99 MIL/uL (ref 3.87–5.11)
RDW: 13.3 % (ref 11.5–15.5)
WBC: 5.8 10*3/uL (ref 4.0–10.5)
nRBC: 0 % (ref 0.0–0.2)

## 2021-09-03 LAB — BLOOD GAS, ARTERIAL
Acid-Base Excess: 10 mmol/L — ABNORMAL HIGH (ref 0.0–2.0)
Bicarbonate: 38.5 mmol/L — ABNORMAL HIGH (ref 20.0–28.0)
Expiratory PAP: 8
FIO2: 0.4
Inspiratory PAP: 14
Mechanical Rate: 12
Mode: POSITIVE
O2 Saturation: 98 %
Patient temperature: 37
RATE: 20 resp/min
pCO2 arterial: 73 mmHg (ref 32.0–48.0)
pH, Arterial: 7.33 — ABNORMAL LOW (ref 7.350–7.450)
pO2, Arterial: 110 mmHg — ABNORMAL HIGH (ref 83.0–108.0)

## 2021-09-03 LAB — COMPREHENSIVE METABOLIC PANEL
ALT: 15 U/L (ref 0–44)
AST: 21 U/L (ref 15–41)
Albumin: 3 g/dL — ABNORMAL LOW (ref 3.5–5.0)
Alkaline Phosphatase: 102 U/L (ref 38–126)
Anion gap: 9 (ref 5–15)
BUN: 17 mg/dL (ref 8–23)
CO2: 34 mmol/L — ABNORMAL HIGH (ref 22–32)
Calcium: 8.8 mg/dL — ABNORMAL LOW (ref 8.9–10.3)
Chloride: 91 mmol/L — ABNORMAL LOW (ref 98–111)
Creatinine, Ser: 0.55 mg/dL (ref 0.44–1.00)
GFR, Estimated: >60 mL/min (ref >60)
Glucose, Bld: 160 mg/dL — ABNORMAL HIGH (ref 70–99)
Potassium: 4 mmol/L (ref 3.5–5.1)
Sodium: 134 mmol/L — ABNORMAL LOW (ref 135–145)
Total Bilirubin: 0.9 mg/dL (ref 0.3–1.2)
Total Protein: 6.4 g/dL — ABNORMAL LOW (ref 6.5–8.1)

## 2021-09-03 LAB — MAGNESIUM: Magnesium: 2.4 mg/dL (ref 1.7–2.4)

## 2021-09-03 LAB — RESP PANEL BY RT-PCR (FLU A&B, COVID) ARPGX2
Influenza A by PCR: NEGATIVE
Influenza B by PCR: NEGATIVE
SARS Coronavirus 2 by RT PCR: NEGATIVE

## 2021-09-03 LAB — BRAIN NATRIURETIC PEPTIDE: B Natriuretic Peptide: 217.5 pg/mL — ABNORMAL HIGH (ref 0.0–100.0)

## 2021-09-03 LAB — TROPONIN I (HIGH SENSITIVITY)
Troponin I (High Sensitivity): 38 ng/L — ABNORMAL HIGH (ref <18)
Troponin I (High Sensitivity): 36 ng/L — ABNORMAL HIGH (ref <18)

## 2021-09-03 MED ORDER — ONDANSETRON HCL 4 MG/2ML IJ SOLN: 4.0000 mg | Freq: Four times a day (QID) | INTRAMUSCULAR | Status: DC | PRN

## 2021-09-03 MED ORDER — DOCUSATE SODIUM 100 MG PO CAPS: 100.0000 mg | ORAL_CAPSULE | Freq: Two times a day (BID) | ORAL | Status: DC | PRN

## 2021-09-03 MED ORDER — IPRATROPIUM-ALBUTEROL 0.5-2.5 (3) MG/3ML IN SOLN
3.0000 mL | Freq: Four times a day (QID) | RESPIRATORY_TRACT | Status: DC
  Administered 2021-09-04 – 2021-09-05 (×4): 3 mL via RESPIRATORY_TRACT
  Filled 2021-09-03 (×5): qty 3

## 2021-09-03 MED ORDER — GUAIFENESIN-DM 100-10 MG/5ML PO SYRP: 5.0000 mL | ORAL_SOLUTION | ORAL | Status: DC | PRN

## 2021-09-03 MED ORDER — PANTOPRAZOLE SODIUM 40 MG PO TBEC
40.0000 mg | DELAYED_RELEASE_TABLET | Freq: Every day | ORAL | Status: DC
  Administered 2021-09-04 – 2021-09-07 (×4): 40 mg via ORAL
  Filled 2021-09-03 (×4): qty 1

## 2021-09-03 MED ORDER — PREDNISONE 20 MG PO TABS
40.0000 mg | ORAL_TABLET | Freq: Every day | ORAL | Status: DC
  Administered 2021-09-05: 05:00:00 40 mg via ORAL
  Filled 2021-09-03: qty 2

## 2021-09-03 MED ORDER — METHYLPREDNISOLONE SODIUM SUCC 125 MG IJ SOLR
120.0000 mg | INTRAMUSCULAR | Status: AC
  Administered 2021-09-04: 120 mg via INTRAVENOUS
  Filled 2021-09-03: qty 2

## 2021-09-03 MED ORDER — IPRATROPIUM-ALBUTEROL 0.5-2.5 (3) MG/3ML IN SOLN
6.0000 mL | Freq: Once | RESPIRATORY_TRACT | Status: AC
  Administered 2021-09-03: 6 mL via RESPIRATORY_TRACT

## 2021-09-03 MED ORDER — NICOTINE 21 MG/24HR TD PT24
21.0000 mg | MEDICATED_PATCH | Freq: Every day | TRANSDERMAL | Status: DC
  Administered 2021-09-04 – 2021-09-07 (×4): 21 mg via TRANSDERMAL
  Filled 2021-09-03 (×4): qty 1

## 2021-09-03 MED ORDER — OXYCODONE HCL 5 MG PO TABS
5.0000 mg | ORAL_TABLET | Freq: Three times a day (TID) | ORAL | Status: DC | PRN
  Administered 2021-09-04 – 2021-09-05 (×2): 5 mg via ORAL
  Filled 2021-09-03 (×2): qty 1

## 2021-09-03 MED ORDER — SODIUM CHLORIDE 0.9% FLUSH
3.0000 mL | Freq: Two times a day (BID) | INTRAVENOUS | Status: DC
  Administered 2021-09-03 – 2021-09-04 (×2): 3 mL via INTRAVENOUS

## 2021-09-03 MED ORDER — IOHEXOL 350 MG/ML SOLN
80.0000 mL | Freq: Once | INTRAVENOUS | Status: AC | PRN
  Administered 2021-09-04: 80 mL via INTRAVENOUS

## 2021-09-03 MED ORDER — METHYLPREDNISOLONE SODIUM SUCC 125 MG IJ SOLR: 60.0000 mg | Freq: Two times a day (BID) | INTRAMUSCULAR | Status: DC

## 2021-09-03 MED ORDER — POLYETHYLENE GLYCOL 3350 17 G PO PACK: 17.0000 g | PACK | Freq: Every day | ORAL | Status: DC | PRN

## 2021-09-03 MED ORDER — HYDRALAZINE HCL 20 MG/ML IJ SOLN: 5.0000 mg | INTRAMUSCULAR | Status: DC | PRN

## 2021-09-03 MED ORDER — PREDNISONE 20 MG PO TABS: 40.0000 mg | ORAL_TABLET | Freq: Every day | ORAL | Status: DC

## 2021-09-03 MED ORDER — ACETAMINOPHEN 650 MG RE SUPP: 650.0000 mg | Freq: Four times a day (QID) | RECTAL | Status: DC | PRN

## 2021-09-03 MED ORDER — DOXYCYCLINE HYCLATE 100 MG PO TABS
100.0000 mg | ORAL_TABLET | Freq: Two times a day (BID) | ORAL | Status: DC
  Administered 2021-09-04 – 2021-09-05 (×3): 100 mg via ORAL
  Filled 2021-09-03 (×3): qty 1

## 2021-09-03 MED ORDER — LACTATED RINGERS IV BOLUS
1000.0000 mL | Freq: Once | INTRAVENOUS | Status: AC
  Administered 2021-09-03: 1000 mL via INTRAVENOUS

## 2021-09-03 MED ORDER — LACTATED RINGERS IV SOLN: INTRAVENOUS | Status: AC

## 2021-09-03 MED ORDER — CLOPIDOGREL BISULFATE 75 MG PO TABS
75.0000 mg | ORAL_TABLET | Freq: Every day | ORAL | Status: DC
  Administered 2021-09-04 – 2021-09-07 (×4): 75 mg via ORAL
  Filled 2021-09-03 (×4): qty 1

## 2021-09-03 MED ORDER — HEPARIN SODIUM (PORCINE) 5000 UNIT/ML IJ SOLN
5000.0000 [IU] | Freq: Three times a day (TID) | INTRAMUSCULAR | Status: DC
  Administered 2021-09-04 – 2021-09-07 (×12): 5000 [IU] via SUBCUTANEOUS
  Filled 2021-09-03 (×12): qty 1

## 2021-09-03 MED ORDER — BISACODYL 5 MG PO TBEC: 5.0000 mg | DELAYED_RELEASE_TABLET | Freq: Every day | ORAL | Status: DC | PRN

## 2021-09-03 MED ORDER — ONDANSETRON HCL 4 MG PO TABS: 4.0000 mg | ORAL_TABLET | Freq: Four times a day (QID) | ORAL | Status: DC | PRN

## 2021-09-03 MED ORDER — LACTULOSE 10 GM/15ML PO SOLN
20.0000 g | Freq: Two times a day (BID) | ORAL | Status: DC
  Filled 2021-09-03 (×5): qty 30

## 2021-09-03 MED ORDER — DOCUSATE SODIUM 100 MG PO CAPS
100.0000 mg | ORAL_CAPSULE | Freq: Two times a day (BID) | ORAL | Status: DC
  Administered 2021-09-04: 100 mg via ORAL
  Filled 2021-09-03 (×5): qty 1

## 2021-09-03 MED ORDER — ALBUTEROL SULFATE (2.5 MG/3ML) 0.083% IN NEBU
2.5000 mg | INHALATION_SOLUTION | RESPIRATORY_TRACT | Status: DC | PRN
  Administered 2021-09-05: 2.5 mg via RESPIRATORY_TRACT
  Filled 2021-09-03: qty 3

## 2021-09-03 MED ORDER — ACETAMINOPHEN 325 MG PO TABS
650.0000 mg | ORAL_TABLET | Freq: Four times a day (QID) | ORAL | Status: DC | PRN
  Administered 2021-09-04 – 2021-09-06 (×3): 650 mg via ORAL
  Filled 2021-09-03 (×3): qty 2

## 2021-09-03 MED ORDER — METHYLPREDNISOLONE SODIUM SUCC 125 MG IJ SOLR
125.0000 mg | Freq: Once | INTRAMUSCULAR | Status: AC
  Administered 2021-09-03: 125 mg via INTRAVENOUS
  Filled 2021-09-03: qty 2

## 2021-09-03 NOTE — ED Provider Notes (Signed)
[facility] Provider Note    Event Date/Time   First MD Initiated Contact with Patient 09/03/21 1732     (approximate)   History   Respiratory Distress   HPI  Bethany Villa is a 66 y.o. female who presents to the ED for evaluation of Respiratory Distress   Discharge summary from 12/27.  She was admitted then for an acute COPD exacerbation.  Also is a history of diastolic dysfunction.  Discharged with Levaquin.  Takes Plavix but no anticoagulation.  Patient brought to the ED from [facility] for respiratory distress and hypoxia.  EMS reports sats in the 60s on arrival, improved with nonrebreather.  She was provided duo nebs and IV magnesium with EMS with some improvement of her symptoms.  She reports continued dyspnea without chest pain, fever or syncope.  She reports that she does not know what happened today.  Physical Exam   Triage Vital Signs: ED Triage Vitals  Enc Vitals Group     BP 09/03/21 1800 119/76     Pulse Rate 09/03/21 1800 (!) 121     Resp 09/03/21 1800 19     Temp 09/03/21 1739 98.7 F (37.1 C)     Temp src --      SpO2 09/03/21 1800 100 %     Weight 09/03/21 1739 209 lb (94.8 kg)     Height --      Head Circumference --      Peak Flow --      Pain Score --      Pain Loc --      Pain Edu? --      Excl. in GC? --     Most recent vital signs: Vitals:   09/03/21 2130 09/03/21 2200  BP: 126/75 116/61  Pulse: (!) 103 (!) 104  Resp: 15 19  Temp:    SpO2: 96% 95%    General: Awake, sitting upright in bed and appears uncomfortable. CV:  Good peripheral perfusion.  Tachycardic and regular. Resp:  Tachypneic, tripoding and with nearly absent breath sounds.  Faint and scattered expiratory wheezes are noted, prolonged expiratory phase. Abd:  No distention.  MSK:  No deformity noted.  Minimal trace pitting edema bilaterally without overlying skin changes or signs of trauma. Neuro:  No focal deficits  appreciated. Other:     ED Results / Procedures / Treatments   Labs (all labs ordered are listed, but only abnormal results are displayed) Labs Reviewed  COMPREHENSIVE METABOLIC PANEL - Abnormal; Notable for the following components:      Result Value   Sodium 134 (*)    Chloride 91 (*)    CO2 34 (*)    Glucose, Bld 160 (*)    Calcium 8.8 (*)    Total Protein 6.4 (*)    Albumin 3.0 (*)    All other components within normal limits  CBC WITH DIFFERENTIAL/PLATELET - Abnormal; Notable for the following components:   Hemoglobin 11.7 (*)    All other components within normal limits  BRAIN NATRIURETIC PEPTIDE - Abnormal; Notable for the following components:   B Natriuretic Peptide 217.5 (*)    All other components within normal limits  BLOOD GAS, ARTERIAL - Abnormal; Notable for the following components:   pH, Arterial 7.33 (*)    pCO2 arterial 73 (*)    pO2, Arterial 110 (*)    Bicarbonate 38.5 (*)    Acid-Base Excess 10.0 (*)    All other components  within normal limits  TROPONIN I (HIGH SENSITIVITY) - Abnormal; Notable for the following components:   Troponin I (High Sensitivity) 38 (*)    All other components within normal limits  TROPONIN I (HIGH SENSITIVITY) - Abnormal; Notable for the following components:   Troponin I (High Sensitivity) 36 (*)    All other components within normal limits  RESP PANEL BY RT-PCR (FLU A&B, COVID) ARPGX2  EXPECTORATED SPUTUM ASSESSMENT W GRAM STAIN, RFLX TO RESP C  MAGNESIUM  URINALYSIS, COMPLETE (UACMP) WITH MICROSCOPIC  BASIC METABOLIC PANEL  CBC  CBC  CREATININE, SERUM  D-DIMER, QUANTITATIVE  PROCALCITONIN    EKG  Sinus tachycardia, rate of 122 bpm.  Normal axis.  Right bundle.  No STEMI.  RADIOLOGY CXR reviewed by me without evidence of acute cardiopulmonary pathology.  Official radiology report(s): DG Chest Portable 1 View  Result Date: 09/03/2021 CLINICAL DATA:  COPD, on BiPAP. EXAM: PORTABLE CHEST 1 VIEW COMPARISON:   Radiograph 08/21/2021 FINDINGS: Normal heart size and mediastinal contours. Chronic peribronchial thickening. Mild streaky scarring in the suprahilar right lung. No confluent consolidation, pleural effusion, or pneumothorax. Stable osseous structures. IMPRESSION: Chronic peribronchial thickening. Electronically Signed   By: Narda RutherfordMelanie  Sanford M.D.   On: 09/03/2021 18:17    PROCEDURES and INTERVENTIONS:  .1-3 Lead EKG Interpretation Performed by: Delton PrairieSmith, Akyra Bouchie, MD Authorized by: Delton PrairieSmith, Niomie Englert, MD     Interpretation: abnormal     ECG rate:  110   ECG rate assessment: tachycardic     Rhythm: sinus tachycardia     Ectopy: none     Conduction: normal   .Critical Care Performed by: Delton PrairieSmith, Melanni Benway, MD Authorized by: Delton PrairieSmith, Woodford Strege, MD   Critical care provider statement:    Critical care time (minutes):  30   Critical care time was exclusive of:  Separately billable procedures and treating other patients   Critical care was necessary to treat or prevent imminent or life-threatening deterioration of the following conditions:  Respiratory failure   Critical care was time spent personally by me on the following activities:  Development of treatment plan with patient or surrogate, discussions with consultants, evaluation of patient's response to treatment, examination of patient, ordering and review of laboratory studies, ordering and review of radiographic studies, ordering and performing treatments and interventions, pulse oximetry, re-evaluation of patient's condition and review of old charts  Medications  oxyCODONE (Oxy IR/ROXICODONE) immediate release tablet 5 mg (has no administration in time range)  lactulose (CHRONULAC) 10 GM/15ML solution 20 g (has no administration in time range)  pantoprazole (PROTONIX) EC tablet 40 mg (has no administration in time range)  clopidogrel (PLAVIX) tablet 75 mg (has no administration in time range)  albuterol (PROVENTIL) (2.5 MG/3ML) 0.083% nebulizer solution 2.5 mg  (has no administration in time range)  guaiFENesin-dextromethorphan (ROBITUSSIN DM) 100-10 MG/5ML syrup 5 mL (has no administration in time range)  docusate sodium (COLACE) capsule 100 mg (has no administration in time range)  ipratropium-albuterol (DUONEB) 0.5-2.5 (3) MG/3ML nebulizer solution 3 mL (has no administration in time range)  sodium chloride flush (NS) 0.9 % injection 3 mL (3 mLs Intravenous Given 09/03/21 2256)  lactated ringers infusion (has no administration in time range)  acetaminophen (TYLENOL) tablet 650 mg (has no administration in time range)    Or  acetaminophen (TYLENOL) suppository 650 mg (has no administration in time range)  docusate sodium (COLACE) capsule 100 mg (has no administration in time range)  polyethylene glycol (MIRALAX / GLYCOLAX) packet 17 g (has no administration in time  range)  bisacodyl (DULCOLAX) EC tablet 5 mg (has no administration in time range)  ondansetron (ZOFRAN) tablet 4 mg (has no administration in time range)    Or  ondansetron (ZOFRAN) injection 4 mg (has no administration in time range)  nicotine (NICODERM CQ - dosed in mg/24 hours) patch 21 mg (has no administration in time range)  hydrALAZINE (APRESOLINE) injection 5 mg (has no administration in time range)  doxycycline (VIBRA-TABS) tablet 100 mg (has no administration in time range)  heparin injection 5,000 Units (has no administration in time range)  methylPREDNISolone sodium succinate (SOLU-MEDROL) 125 mg/2 mL injection 120 mg (has no administration in time range)    Followed by  predniSONE (DELTASONE) tablet 40 mg (has no administration in time range)  iohexol (OMNIPAQUE) 350 MG/ML injection 80 mL (has no administration in time range)  lactated ringers bolus 1,000 mL (1,000 mLs Intravenous New Bag/Given 09/03/21 2014)  ipratropium-albuterol (DUONEB) 0.5-2.5 (3) MG/3ML nebulizer solution 6 mL (6 mLs Nebulization Given 09/03/21 2015)  methylPREDNISolone sodium succinate (SOLU-MEDROL) 125  mg/2 mL injection 125 mg (125 mg Intravenous Given 09/03/21 2015)     IMPRESSION / MDM / ASSESSMENT AND PLAN / ED COURSE  I reviewed the triage vital signs and the nursing notes.  66 year old female presents to the ED with evidence of a COPD exacerbation requiring BiPAP and medical admission.  She presents tripoding with very poor airflow, improved with BiPAP and breathing treatments.  No indication for intubation.  ABG with mild respiratory acidosis with a pH of 7.33.  No signs of ACS, marginal troponin elevation and is flat.  BNP is slightly elevated suggestive of CHF component, but seems to be primarily COPD.  CBC is reassuring without leukocytosis and metabolic panel with chronic elevation of CO2.  She is improving with BiPAP and her tachycardia is also improving.  Less likely PE.  Consulted with hospitalist who agrees to admit.  Clinical Course as of 09/03/21 2322  Thu Sep 03, 2021  1935 Reassessed.  Still wheezing but tachycardia has improved.  We will provide some additional breathing treatments and steroids. [DS]    Clinical Course User Index [DS] Delton Prairie, MD     FINAL CLINICAL IMPRESSION(S) / ED DIAGNOSES   Final diagnoses:  COPD exacerbation (HCC)     Rx / DC Orders   ED Discharge Orders     None        Note:  This document was prepared using Dragon voice recognition software and may include unintentional dictation errors.   Delton Prairie, MD 09/03/21 [phone]

## 2021-09-03 NOTE — ED Triage Notes (Signed)
Pt brought in for Resp distress. Per EMS pt was in the low 60's on RA. Pt was given 3 duo neb and 2g of mag while en route to ED. Pt is not on bipap at 40%Fio2.

## 2021-09-03 NOTE — ED Notes (Signed)
This RN spoke to pts son, who came to the desk. This RN unable to answer all questions & let son know that pts RN is currently in another critically ill pts room & she will let pts nurse know that son has questions. This RN also let Dr. Katrinka Blazing know that son is now at [facility] & has questions when he is available.

## 2021-09-03 NOTE — H&P (Signed)
History and Physical    Bethany Villa [id number] DOB: Jul 23, 1956 DOA: 09/03/2021  PCP: Marinda Elk, MD    Patient coming from:  [facility]NF[facility]   Chief Complaint:  Hypoxia    HPI:  Bethany Villa is a 66 y.o. female seen in ed with complaints of Hypoxia on SNF papers. Pt is alert and oriented but does not know what happened.s he initially states she does not know where she lives. Then she says she lives at facility. When asked she does not feel bad in anyway and does not know what happened. Pt denies any complaints and ros I negative.  Per triage note pt was brought in for respiratory distress with o2 sats in 60's on RA. Pt was given duoneb and magnesium .pt was started on bipap for hypoxia.she was discharged on 08/25/2021.In last note pt was noted to be noncompliant and sedated due to her buprenorphine patch.    Pt has past medical history of anxiety, asthma, chronic systolic heart failure, COPD, GERD, chronic to therapy, obstructive sleep apnea, allergies to gabapentin, Mobic, penicillin, sulfa.  ED Course:   Vitals:   09/03/21 2115 09/03/21 2130 09/03/21 2200 09/03/21 2300  BP:  126/75 116/61 103/69  Pulse: (!) 107 (!) 103 (!) 104 98  Resp: 18 15 19 15   Temp:      SpO2: 95% 96% 95% 95%  Weight:       In the emergency room patient is alert awake oriented on BiPAP noted to be tachycardic.  ABG shows pH of 7.33 and PCO2 of 73 PO2 110.,  CMP shows sodium of 134 glucose 160, albumin of 3.0.  BNP of 217.5, troponin of 38 and 36, CBC showing normal white count of 5.8 hemoglobin of 11.7 and platelet count of 185.  Respiratory panel negative for flu and COVID.  CT angio chest is pending.  Review of Systems:  Review of Systems  Constitutional: Negative.   HENT: Negative.    Eyes: Negative.   Respiratory: Negative.    Cardiovascular: Negative.   Gastrointestinal: Negative.   Musculoskeletal: Negative.   Neurological: Negative.     Past Medical History:   Diagnosis Date   Anxiety    Asthma    Chronic pain    lower back   Chronic systolic CHF (congestive heart failure) ([place]) 09/22/2019   COPD (chronic obstructive pulmonary disease) (HCC)    Depression    DJD (degenerative joint disease)    Fibromyalgia    GERD (gastroesophageal reflux disease)    Headache    Hyperlipidemia    Lower extremity edema    On home oxygen therapy    has not been on since 2016   Panic attack    Shortness of breath dyspnea    Sleep disorder     Past Surgical History:  Procedure Laterality Date   ABDOMINAL SURGERY     ANKLE SURGERY     BACK SURGERY     lower   CARPAL TUNNEL RELEASE     CESAREAN SECTION     I & D KNEE WITH POLY EXCHANGE N/A 06/11/2020   Procedure: IRRIGATION AND DEBRIDEMENT KNEE WITH POLY EXCHANGE;  Surgeon: Corky Mull, MD;  Location: [facility] ORS;  Service: Orthopedics;  Laterality: N/A;   KNEE SURGERY     TONSILLECTOMY     TOTAL KNEE ARTHROPLASTY Left 04/14/2016   Procedure: TOTAL KNEE ARTHROPLASTY;  Surgeon: Earnestine Leys, MD;  Location: [facility] ORS;  Service: Orthopedics;  Laterality: Left;  TUBAL LIGATION       reports that she has been smoking cigarettes. She has a 45.00 pack-year smoking history. She has never used smokeless tobacco. She reports current alcohol use. She reports that she does not use drugs.  Allergies  Allergen Reactions   Gabapentin Shortness Of Breath and Rash   Mobic [Meloxicam] Anaphylaxis   Penicillins Shortness Of Breath and Swelling    Tolerated Zosyn December 2022  Has patient had a PCN reaction causing immediate rash, facial/tongue/throat swelling, SOB or lightheadedness with hypotension: unknown Has patient had a PCN reaction causing severe rash involving mucus membranes or skin necrosis: {unknown Has patient had a PCN reaction that required hospitalization {unknown Has patient had a PCN reaction occurring within the last 10 years: no If all of the above answers are "NO", then may proceed with  Cephalosporin use.   Sulfa Antibiotics Shortness Of Breath and Swelling    Family History  Problem Relation Age of Onset   Cancer Mother        beast    Prior to Admission medications   Medication Sig Start Date End Date Taking? Authorizing Provider  albuterol (PROVENTIL) (2.5 MG/3ML) 0.083% nebulizer solution Take 3 mLs (2.5 mg total) by nebulization every 2 (two) hours as needed for shortness of breath or wheezing. 08/24/21  Yes Fritzi Mandes, MD  clopidogrel (PLAVIX) 75 MG tablet Take 1 tablet (75 mg total) by mouth daily. 08/13/21 08/13/22 Yes Lorella Nimrod, MD  docusate sodium (COLACE) 100 MG capsule Take 1 capsule (100 mg total) by mouth 2 (two) times daily as needed for mild constipation. 08/24/21  Yes Fritzi Mandes, MD  lactulose (CHRONULAC) 10 GM/15ML solution Take 30 mLs (20 g total) by mouth 2 (two) times daily. 08/13/21  Yes Lorella Nimrod, MD  lidocaine (LIDODERM) 5 % Place 1 patch onto the skin daily. Remove & Discard patch within 12 hours or as directed by MD 08/13/21  Yes Lorella Nimrod, MD  oxyCODONE (OXY IR/ROXICODONE) 5 MG immediate release tablet Take 1 tablet (5 mg total) by mouth 3 (three) times daily as needed for moderate pain. 08/24/21  Yes Fritzi Mandes, MD  pantoprazole (PROTONIX) 40 MG tablet Take 1 tablet (40 mg total) by mouth daily. 08/14/21  Yes Lorella Nimrod, MD  guaiFENesin-dextromethorphan (ROBITUSSIN DM) 100-10 MG/5ML syrup Take 5 mLs by mouth every 4 (four) hours as needed for cough. Patient not taking: Reported on 09/03/2021 08/13/21   Lorella Nimrod, MD    Physical Exam: Vitals:   09/03/21 2115 09/03/21 2130 09/03/21 2200 09/03/21 2300  BP:  126/75 116/61 103/69  Pulse: (!) 107 (!) 103 (!) 104 98  Resp: 18 15 19 15   Temp:      SpO2: 95% 96% 95% 95%  Weight:       Physical Exam Vitals reviewed.  Constitutional:      General: She is awake.     Appearance: She is obese. She is not ill-appearing.     Comments: Bipap   HENT:     Head: Normocephalic and  atraumatic.     Right Ear: External ear normal.     Left Ear: External ear normal.     Nose: Nose normal.     Mouth/Throat:     Mouth: Mucous membranes are moist.  Eyes:     Extraocular Movements: Extraocular movements intact.     Pupils: Pupils are equal, round, and reactive to light.  Cardiovascular:     Rate and Rhythm: Normal rate and regular rhythm.  Pulses: Normal pulses.     Heart sounds: Normal heart sounds.  Pulmonary:     Effort: Pulmonary effort is normal.     Breath sounds: Normal breath sounds.  Abdominal:     General: Bowel sounds are normal. There is no distension.     Palpations: Abdomen is soft. There is no mass.     Tenderness: There is no abdominal tenderness. There is no guarding.     Hernia: No hernia is present.  Musculoskeletal:     Right lower leg: No edema.     Left lower leg: No edema.  Skin:    General: Skin is warm.  Neurological:     General: No focal deficit present.     Mental Status: She is alert and oriented to person, place, and time.  Psychiatric:        Attention and Perception: Attention normal.        Mood and Affect: Mood and affect normal.        Speech: Speech normal.        Behavior: Behavior normal. Behavior is cooperative.        Cognition and Memory: Memory is impaired.     Comments: Pt states she does not know where she lives and then says she lives at facility.    Labs on Admission: I have personally reviewed following labs and imaging studies  No results for input(s): CKTOTAL, CKMB, TROPONINI in the last 72 hours. Lab Results  Component Value Date   WBC 5.8 09/03/2021   HGB 11.7 (L) 09/03/2021   HCT 38.0 09/03/2021   MCV 95.2 09/03/2021   PLT 185 09/03/2021    Recent Labs  Lab 09/03/21 1743  NA 134*  K 4.0  CL 91*  CO2 34*  BUN 17  CREATININE 0.55  CALCIUM 8.8*  PROT 6.4*  BILITOT 0.9  ALKPHOS 102  ALT 15  AST 21  GLUCOSE 160*   Lab Results  Component Value Date   CHOL 186 08/01/2015   HDL 45  08/01/2015   LDLCALC 122 (H) 08/01/2015   TRIG 96 08/01/2015   Lab Results  Component Value Date   DDIMER (H) 03/10/2009    0.94        AT THE INHOUSE ESTABLISHED CUTOFF VALUE OF 0.48 ug/mL FEU, THIS ASSAY HAS BEEN DOCUMENTED IN THE LITERATURE TO HAVE A SENSITIVITY AND NEGATIVE PREDICTIVE VALUE OF AT LEAST 98 TO 99%.  THE TEST RESULT SHOULD BE CORRELATED WITH AN ASSESSMENT OF THE CLINICAL PROBABILITY OF DVT / VTE.   Invalid input(s): POCBNP   COVID-19 Labs No results for input(s): DDIMER, FERRITIN, LDH, CRP in the last 72 hours. Lab Results  Component Value Date   SARSCOV2NAA NEGATIVE 09/03/2021   SARSCOV2NAA NEGATIVE 08/25/2021   [place] NEGATIVE 08/15/2021   [place] NEGATIVE 08/11/2021    Radiological Exams on Admission: DG Chest Portable 1 View  Result Date: 09/03/2021 CLINICAL DATA:  COPD, on BiPAP. EXAM: PORTABLE CHEST 1 VIEW COMPARISON:  Radiograph 08/21/2021 FINDINGS: Normal heart size and mediastinal contours. Chronic peribronchial thickening. Mild streaky scarring in the suprahilar right lung. No confluent consolidation, pleural effusion, or pneumothorax. Stable osseous structures. IMPRESSION: Chronic peribronchial thickening. Electronically Signed   By: Keith Rake M.D.   On: 09/03/2021 18:17    EKG: Independently reviewed.  Sinus tach 122 with right bundle branch block QTC 49 and Q waves in leads II, III, aVF.   Assessment/Plan: Principal Problem:   Acute respiratory failure with hypoxia and hypercapnia (HCC)  Active Problems:   Smoking   COPD (chronic obstructive pulmonary disease) (HCC)   COPD with acute exacerbation (HCC)   Acute respiratory failure with hypoxia and hypercapnia: Differential include hypercapnia related respiratory failure and hypercapnia due to pain medicine or excessive pain medicine. We will continue patient on BiPAP. We will continue patient on duo nebs and steroids.  COPD/COPD exacerbation: Patient being followed by  lumbar pulmonary clinic. We will continue patient on Solu-Medrol along with DuoNeb's and as needed MDI.  Smoking: Nicotine patch.  Disorientation: Outpatient evaluation by neuropsychiatry for dementia evaluation.  DVT prophylaxis:  Heparin  Code Status:  Full code  Family Communication:  Sowder,Matthew (Son)  [phone] (Mobile)   Disposition Plan:  [place] called:  None  Admission status: Inpatient   Para Skeans MD [facility]  6 PM- 2 AM. Please contact me via secure Chat 6 PM-2 AM. How to contact the [facility] Attending or Consulting provider [place] or covering provider during after hours [place], for this patient.   Check the care team in [facility] and look for a) attending/consulting [facility] provider listed and b) the [facility] team listed Log into [email or web address] and use [place]'s universal password to access. If you do not have the password, please contact the hospital operator. Locate the [facility] provider you are looking for under [facility] and page to a number that you can be directly reached. If you still have difficulty reaching the provider, please page the [facility] (Director on Call) for the Hospitalists listed on amion for assistance. [email or web address] 09/04/2021, 12:57 AM

## 2021-09-04 ENCOUNTER — Encounter: Payer: Self-pay | Admitting: Internal Medicine

## 2021-09-04 ENCOUNTER — Observation Stay: Payer: Medicare Other

## 2021-09-04 ENCOUNTER — Ambulatory Visit: Payer: Medicare Other | Admitting: Family

## 2021-09-04 DIAGNOSIS — J441 Chronic obstructive pulmonary disease with (acute) exacerbation: Secondary | ICD-10-CM | POA: Diagnosis present

## 2021-09-04 DIAGNOSIS — L89326 Pressure-induced deep tissue damage of left buttock: Secondary | ICD-10-CM | POA: Diagnosis present

## 2021-09-04 DIAGNOSIS — L89316 Pressure-induced deep tissue damage of right buttock: Secondary | ICD-10-CM | POA: Diagnosis present

## 2021-09-04 DIAGNOSIS — Z888 Allergy status to other drugs, medicaments and biological substances status: Secondary | ICD-10-CM | POA: Diagnosis not present

## 2021-09-04 DIAGNOSIS — F410 Panic disorder [episodic paroxysmal anxiety] without agoraphobia: Secondary | ICD-10-CM | POA: Diagnosis present

## 2021-09-04 DIAGNOSIS — E785 Hyperlipidemia, unspecified: Secondary | ICD-10-CM | POA: Diagnosis present

## 2021-09-04 DIAGNOSIS — Z880 Allergy status to penicillin: Secondary | ICD-10-CM | POA: Diagnosis not present

## 2021-09-04 DIAGNOSIS — R000 Tachycardia, unspecified: Secondary | ICD-10-CM | POA: Diagnosis present

## 2021-09-04 DIAGNOSIS — J9601 Acute respiratory failure with hypoxia: Secondary | ICD-10-CM | POA: Diagnosis present

## 2021-09-04 DIAGNOSIS — Z91199 Patient's noncompliance with other medical treatment and regimen due to unspecified reason: Secondary | ICD-10-CM | POA: Diagnosis not present

## 2021-09-04 DIAGNOSIS — F17210 Nicotine dependence, cigarettes, uncomplicated: Secondary | ICD-10-CM | POA: Diagnosis present

## 2021-09-04 DIAGNOSIS — G4733 Obstructive sleep apnea (adult) (pediatric): Secondary | ICD-10-CM | POA: Diagnosis present

## 2021-09-04 DIAGNOSIS — Z882 Allergy status to sulfonamides status: Secondary | ICD-10-CM | POA: Diagnosis not present

## 2021-09-04 DIAGNOSIS — J9621 Acute and chronic respiratory failure with hypoxia: Secondary | ICD-10-CM | POA: Diagnosis present

## 2021-09-04 DIAGNOSIS — L89896 Pressure-induced deep tissue damage of other site: Secondary | ICD-10-CM | POA: Diagnosis present

## 2021-09-04 DIAGNOSIS — J9602 Acute respiratory failure with hypercapnia: Secondary | ICD-10-CM | POA: Diagnosis not present

## 2021-09-04 DIAGNOSIS — J9691 Respiratory failure, unspecified with hypoxia: Secondary | ICD-10-CM | POA: Diagnosis present

## 2021-09-04 DIAGNOSIS — G894 Chronic pain syndrome: Secondary | ICD-10-CM | POA: Diagnosis present

## 2021-09-04 DIAGNOSIS — I5022 Chronic systolic (congestive) heart failure: Secondary | ICD-10-CM | POA: Diagnosis present

## 2021-09-04 DIAGNOSIS — Z9981 Dependence on supplemental oxygen: Secondary | ICD-10-CM | POA: Diagnosis not present

## 2021-09-04 DIAGNOSIS — Z886 Allergy status to analgesic agent status: Secondary | ICD-10-CM | POA: Diagnosis not present

## 2021-09-04 DIAGNOSIS — Z20822 Contact with and (suspected) exposure to covid-19: Secondary | ICD-10-CM | POA: Diagnosis present

## 2021-09-04 DIAGNOSIS — J9622 Acute and chronic respiratory failure with hypercapnia: Secondary | ICD-10-CM | POA: Diagnosis present

## 2021-09-04 DIAGNOSIS — F0394 Unspecified dementia, unspecified severity, with anxiety: Secondary | ICD-10-CM | POA: Diagnosis present

## 2021-09-04 DIAGNOSIS — M797 Fibromyalgia: Secondary | ICD-10-CM | POA: Diagnosis present

## 2021-09-04 LAB — BASIC METABOLIC PANEL
Anion gap: 8 (ref 5–15)
BUN: 16 mg/dL (ref 8–23)
CO2: 37 mmol/L — ABNORMAL HIGH (ref 22–32)
Calcium: 8.6 mg/dL — ABNORMAL LOW (ref 8.9–10.3)
Chloride: 90 mmol/L — ABNORMAL LOW (ref 98–111)
Creatinine, Ser: 0.5 mg/dL (ref 0.44–1.00)
GFR, Estimated: >60 mL/min (ref >60)
Glucose, Bld: 192 mg/dL — ABNORMAL HIGH (ref 70–99)
Potassium: 3.9 mmol/L (ref 3.5–5.1)
Sodium: 135 mmol/L (ref 135–145)

## 2021-09-04 LAB — D-DIMER, QUANTITATIVE: D-Dimer, Quant: 0.58 ug/mL-FEU — ABNORMAL HIGH (ref 0.00–0.50)

## 2021-09-04 LAB — CBC
HCT: 36.3 % (ref 36.0–46.0)
Hemoglobin: 11.3 g/dL — ABNORMAL LOW (ref 12.0–15.0)
MCH: 28.7 pg (ref 26.0–34.0)
MCHC: 31.1 g/dL (ref 30.0–36.0)
MCV: 92.1 fL (ref 80.0–100.0)
Platelets: 165 10*3/uL (ref 150–400)
RBC: 3.94 MIL/uL (ref 3.87–5.11)
RDW: 13.6 % (ref 11.5–15.5)
WBC: 3.9 10*3/uL — ABNORMAL LOW (ref 4.0–10.5)
nRBC: 0 % (ref 0.0–0.2)

## 2021-09-04 LAB — PROCALCITONIN: Procalcitonin: <0.1 ng/mL

## 2021-09-04 MED ORDER — GLUCERNA SHAKE PO LIQD
237.0000 mL | Freq: Two times a day (BID) | ORAL | Status: DC
  Administered 2021-09-05 – 2021-09-07 (×3): 237 mL via ORAL

## 2021-09-04 MED ORDER — ADULT MULTIVITAMIN W/MINERALS CH
1.0000 | ORAL_TABLET | Freq: Every day | ORAL | Status: DC
  Administered 2021-09-05 – 2021-09-07 (×3): 1 via ORAL
  Filled 2021-09-04 (×3): qty 1

## 2021-09-04 MED ORDER — ENSURE MAX PROTEIN PO LIQD
11.0000 [oz_av] | Freq: Every day | ORAL | Status: DC
  Administered 2021-09-04: 11 [oz_av] via ORAL
  Filled 2021-09-04: qty 330

## 2021-09-04 NOTE — Progress Notes (Signed)
PROGRESS NOTE    Bethany Villa  HYQ:[id number] DOB: 12/16/1955 DOA: 09/03/2021 PCP: Patrice ParadiseMcLaughlin, Miriam K, MD    Brief Narrative:  66 year old female with dementia, COPD, chronic pain syndrome previously on Suboxone, obstructive sleep apnea intermittently on CPAP who was admitted 2 times last month in the hospital and treated with BiPAP and discharged back SNF on 2 L oxygen comes back with respiratory distress. 08/10/2021- 08/13/2021, treated with BiPAP and weaned to oxygen and discharged home.  12/17-12/27, similar presentation.  Treated with BiPAP and discharged to SNF , not wearing CPAP.  Assessment & Plan:   Principal Problem:   Acute respiratory failure with hypoxia and hypercapnia (HCC) Active Problems:   Smoking   COPD (chronic obstructive pulmonary disease) (HCC)   COPD with acute exacerbation (HCC)  Acute on chronic hypoxemic and hypercapnic respiratory failure: Suspect multifactorial respiratory failure. Untreated sleep apnea. Previous multiple admissions on BiPAP but non compliant to CPAP.  Keep on oxygen to keep saturation more than 90%. Continue supportive therapy with bronchodilator treatment, steroids, antibiotics and respiratory therapy, chest physiotherapy to treat for COPD exacerbation. Use CPAP consistent at night and oxygen in day time.   Chronic systolic congestive heart failure: Currently euvolemic.  Chronic pain syndrome on oxycodone. Not on buprenorphine patch now.   Cognitive dysfunction: progressive memory loss. Non compliance.  Will send to neurology for follow-up once medical issues are taken care of.  Called and discussed with patient's son Mr. Molli HazardMatthew on the phone.  He tells me that patient has CPAP at home and intermittently used in the past.  When she went to [facility] rehab, apparently she might not have been using CPAP. Her readmissions are likely related to not using CPAP at night.  Will ensure that she uses CPAP at night and they have it  available at the rehab center before discharging patient. Start mobilizing with PT OT.  DVT prophylaxis: heparin injection 5,000 Units Start: 09/03/21 2300 SCDs Start: 09/03/21 2221   Code Status: full code  Family Communication: Son Molli HazardMatthew on phone.  Disposition Plan: Status is: Observation  The patient remains OBS appropriate and will d/c before 2 midnights.        Consultants:  None  Procedures:  None  Antimicrobials:  None   Subjective: Patient seen and examined.  Denies any complaints.  She still has some pain on the right side of the ribs.  She tells me that she does not remember being in the hospital last month.  She tells me she does not have any shortness of breath.  Does not know why she is in the hospital and what brought her to the ER.  Currently denies shortness of breath.  Objective: Vitals:   09/04/21 0400 09/04/21 0600 09/04/21 0630 09/04/21 0700  BP: 127/82 125/87 127/85 127/76  Pulse: 86 85 85 90  Resp: 15 15 14 15   Temp:      SpO2: 96% 95% 95% 96%  Weight:       No intake or output data in the 24 hours ending 09/04/21 0923 Filed Weights   09/03/21 1739  Weight: 94.8 kg    Examination:  General: Looks fairly comfortable.  On 40% FiO2.  Able to talk in complete sentences. Cardiovascular: S1-S2 normal.  Regular rate rhythm. Respiratory: Bilateral clear.  No added sounds.  Some BiPAP tubing sounds. Gastrointestinal: Soft and nontender.  Bowel sound present. Ext: No swelling.  No cyanosis or edema. Neuro: Alert and awake.  Oriented x1-2.  No focal  deficits.   Data Reviewed: I have personally reviewed following labs and imaging studies  CBC: Recent Labs  Lab 09/03/21 1743  WBC 5.8  NEUTROABS 4.2  HGB 11.7*  HCT 38.0  MCV 95.2  PLT 185   Basic Metabolic Panel: Recent Labs  Lab 09/03/21 1743  NA 134*  K 4.0  CL 91*  CO2 34*  GLUCOSE 160*  BUN 17  CREATININE 0.55  CALCIUM 8.8*  MG 2.4   GFR: Estimated Creatinine  Clearance: 79.3 mL/min (by C-G formula based on SCr of 0.55 mg/dL). Liver Function Tests: Recent Labs  Lab 09/03/21 1743  AST 21  ALT 15  ALKPHOS 102  BILITOT 0.9  PROT 6.4*  ALBUMIN 3.0*   No results for input(s): LIPASE, AMYLASE in the last 168 hours. No results for input(s): AMMONIA in the last 168 hours. Coagulation Profile: No results for input(s): INR, PROTIME in the last 168 hours. Cardiac Enzymes: No results for input(s): CKTOTAL, CKMB, CKMBINDEX, TROPONINI in the last 168 hours. BNP (last 3 results) No results for input(s): PROBNP in the last 8760 hours. HbA1C: No results for input(s): HGBA1C in the last 72 hours. CBG: No results for input(s): GLUCAP in the last 168 hours. Lipid Profile: No results for input(s): CHOL, HDL, LDLCALC, TRIG, CHOLHDL, LDLDIRECT in the last 72 hours. Thyroid Function Tests: No results for input(s): TSH, T4TOTAL, FREET4, T3FREE, THYROIDAB in the last 72 hours. Anemia Panel: No results for input(s): VITAMINB12, FOLATE, FERRITIN, TIBC, IRON, RETICCTPCT in the last 72 hours. Sepsis Labs: No results for input(s): PROCALCITON, LATICACIDVEN in the last 168 hours.  Recent Results (from the past 240 hour(s))  Resp Panel by RT-PCR (Flu A&B, Covid) Nasopharyngeal Swab     Status: None   Collection Time: 09/03/21  6:23 PM   Specimen: Nasopharyngeal Swab; Nasopharyngeal(NP) swabs in vial transport medium  Result Value Ref Range Status   SARS Coronavirus 2 by RT PCR NEGATIVE NEGATIVE Final    Comment: (NOTE) SARS-CoV-2 target nucleic acids are NOT DETECTED.  The SARS-CoV-2 RNA is generally detectable in upper respiratory specimens during the acute phase of infection. The lowest concentration of SARS-CoV-2 viral copies this assay can detect is 138 copies/mL. A negative result does not preclude SARS-Cov-2 infection and should not be used as the sole basis for treatment or other patient management decisions. A negative result may occur with   improper specimen collection/handling, submission of specimen other than nasopharyngeal swab, presence of viral mutation(s) within the areas targeted by this assay, and inadequate number of viral copies(<138 copies/mL). A negative result must be combined with clinical observations, patient history, and epidemiological information. The expected result is Negative.  Fact Sheet for Patients:  BloggerCourse.com  Fact Sheet for Healthcare Providers:  SeriousBroker.it  This test is no t yet approved or cleared by the [place] FDA and  has been authorized for detection and/or diagnosis of SARS-CoV-2 by FDA under an Emergency Use Authorization (EUA). This EUA will remain  in effect (meaning this test can be used) for the duration of the COVID-19 declaration under Section 564(b)(1) of the Act, 21 U.S.C.section 360bbb-3(b)(1), unless the authorization is terminated  or revoked sooner.       Influenza A by PCR NEGATIVE NEGATIVE Final   Influenza B by PCR NEGATIVE NEGATIVE Final    Comment: (NOTE) The Xpert Xpress SARS-CoV-2/FLU/RSV plus assay is intended as an aid in the diagnosis of influenza from Nasopharyngeal swab specimens and should not be used as a sole basis for  treatment. Nasal washings and aspirates are unacceptable for Xpert Xpress SARS-CoV-2/FLU/RSV testing.  Fact Sheet for Patients: BloggerCourse.com  Fact Sheet for Healthcare Providers: SeriousBroker.it  This test is not yet approved or cleared by the [place] FDA and has been authorized for detection and/or diagnosis of SARS-CoV-2 by FDA under an Emergency Use Authorization (EUA). This EUA will remain in effect (meaning this test can be used) for the duration of the COVID-19 declaration under Section 564(b)(1) of the Act, 21 U.S.C. section 360bbb-3(b)(1), unless the authorization is terminated  or revoked.  Performed at [facility], [address], [place], [place] [address]          Radiology Studies: DG Chest Portable 1 View  Result Date: 09/03/2021 CLINICAL DATA:  COPD, on BiPAP. EXAM: PORTABLE CHEST 1 VIEW COMPARISON:  Radiograph 08/21/2021 FINDINGS: Normal heart size and mediastinal contours. Chronic peribronchial thickening. Mild streaky scarring in the suprahilar right lung. No confluent consolidation, pleural effusion, or pneumothorax. Stable osseous structures. IMPRESSION: Chronic peribronchial thickening. Electronically Signed   By: Narda Rutherford M.D.   On: 09/03/2021 18:17        Scheduled Meds:  clopidogrel  75 mg Oral Daily   docusate sodium  100 mg Oral BID   doxycycline  100 mg Oral Q12H   heparin  5,000 Units Subcutaneous Q8H   ipratropium-albuterol  3 mL Nebulization Q6H   lactulose  20 g Oral BID   nicotine  21 mg Transdermal Daily   pantoprazole  40 mg Oral Daily   [START ON 09/05/2021] predniSONE  40 mg Oral Q breakfast   sodium chloride flush  3 mL Intravenous Q12H   Continuous Infusions:     LOS: 0 days    Time spent: 35 minutes    Dorcas Carrow, MD [facility] Pager [phone]

## 2021-09-04 NOTE — Evaluation (Signed)
Occupational Therapy Evaluation Patient Details Name: Bethany Villa MRN: [id number] DOB: May 30, 1956 Today's Date: 09/04/2021   History of Present Illness Bethany Villa is a 65yoF who comes [facility] on 09[facility]03/21 in ARF, SpO2 60s% on RA, HR 120s. PMH: GAD, asthma, chronic pain, CHF, COPD, depression, fibromyalgia, GERD, HA, HLD, LEE, home O2 use, OSA. COVID and Flu both (-). Recent admissions in December 2022 for COPD exacerbation , CHF exacerbation, PNA. Pt resides alone at home with support from sons, but DC to [facility] for STR at last admission.   Clinical Impression   Pt seen for OT evaluation this date in setting of acute hospitalization d/t hypoxia. She is well known to this hospital and therapist as she just recently d/c'ed to [facility] for rehab. She presents this date with R rib/flank pain, general deconditioning and decreased fxl activity tolerance. She is noted to have enflamed and peeling skin to nearly the entirety of her peri area. Increased time spent addressing skin hygiene including engaging pt in bathing tasks and applying barrier cream. In addition, increased time engaging pt in hygiene tasks for brushing and combing hair with MOD A d/t matting. She requires CGA with HHA for several bouts of standing ADLs including peri care (MOD A for posterior, CGA for anterior). Pt educated about advocating for herself concerning perineal hygiene to prevent skin breakdown and also importance of mobilization more often including use of BSC versus pure wick. Pt is resistive to education, but ultimately accepts. Will continue to follow acutely. Continue to anticipate that she will require further rehabilitative efforts in STR setting.      Recommendations for follow up therapy are one component of a multi-disciplinary discharge planning process, led by the attending physician.  Recommendations may be updated based on patient status, additional functional criteria and insurance authorization.   Follow Up  Recommendations  Skilled nursing-short term rehab (<3 hours/day)    Assistance Recommended at Discharge Intermittent Supervision/Assistance  Patient can return home with the following      Functional Status Assessment  Patient has had a recent decline in their functional status and demonstrates the ability to make significant improvements in function in a reasonable and predictable amount of time.  Equipment Recommendations  Other (comment) (defer)    Recommendations for Other Services       Precautions / Restrictions Precautions Precautions: Fall Restrictions Weight Bearing Restrictions: No      Mobility Bed Mobility Overal bed mobility: Needs Assistance Bed Mobility: Supine to Sit;Sit to Supine     Supine to sit: Supervision Sit to supine: Supervision   General bed mobility comments: increased time, use of bed rails, HOB elevated    Transfers Overall transfer level: Needs assistance Equipment used: 1 person hand held assist Transfers: Sit to/from Stand Sit to Stand: Supervision;Min guard           General transfer comment: good control, tolerates standing x3 minutes x4 bouts for various ADLs.      Balance Overall balance assessment: Needs assistance   Sitting balance-Leahy Scale: Good     Standing balance support: Single extremity supported Standing balance-Leahy Scale: Fair                             ADL either performed or assessed with clinical judgement   ADL Overall ADL's : Needs assistance/impaired  General ADL Comments: SETUP to MOD A for UB ADLs including g/h tasks (SETUP for washing face, MOD A for combing hair d/t matting). Requires MOD A for LB ADLs (able to perform anterior peri care in standing with only CGA/SUPV for standing balance and requires MOD A for standing posterior, UE support on BSC rails). Requires MOD A for LB dressing d/t pain in R ribs/flank.     Vision  Baseline Vision/History: 1 Wears glasses Patient Visual Report: No change from baseline       Perception     Praxis      Pertinent Vitals/Pain Pain Assessment: 0-10 Pain Score: 8  Pain Location: R ribs/flank Pain Intervention(s): Limited activity within patient's tolerance;Monitored during session;Repositioned (cleaned and applied barrier cream to peri area d/t inflammed/red skin in near-entirety of peri area.)     Hand Dominance Right   Extremity/Trunk Assessment Upper Extremity Assessment Upper Extremity Assessment: Generalized weakness   Lower Extremity Assessment Lower Extremity Assessment: Generalized weakness       Communication Communication Communication: No difficulties   Cognition Arousal/Alertness: Awake/alert Behavior During Therapy: WFL for tasks assessed/performed Overall Cognitive Status: History of cognitive impairments - at baseline Area of Impairment: Memory                     Memory: Decreased short-term memory         General Comments: She is oriented x4, but with occasional STM lapses that come up during session.     General Comments       Exercises Other Exercises Other Exercises: bathing, dressing, grooming, and special attention to peri hygiene and detangling matted hair.   Shoulder Instructions      Home Living Family/patient expects to be discharged to:: Skilled nursing facility [facility] Living Arrangements: Alone Available Help at Discharge: Family;Available PRN/intermittently Type of Home: Apartment Home Access: Level entry     Home Layout: One level     Bathroom Shower/Tub: Chief Strategy OfficerTub/shower unit   Bathroom Toilet: Handicapped height     Home Equipment: Grab bars - tub/shower;Shower seat;Grab bars - toilet;Rollator (4 wheels)          Prior Functioning/Environment Prior Level of Function : Independent/Modified Independent             Mobility Comments: Independent with  ambulation; denies any recent falls; 2 L O2 at night only ADLs Comments: Indep without assist device for ADLs and household management. Sons assist with driving, community errands/needs.        OT Problem List: Decreased strength;Decreased range of motion;Decreased activity tolerance;Impaired balance (sitting and/or standing);Decreased safety awareness;Pain      OT Treatment/Interventions: Self-care/ADL training;Therapeutic exercise;Therapeutic activities;Patient/family education    OT Goals(Current goals can be found in the care plan section) Acute Rehab OT Goals Patient Stated Goal: to get back to living independently OT Goal Formulation: With patient Time For Goal Achievement: 09/18/21 Potential to Achieve Goals: Good  OT Frequency: Min 2X/week    Co-evaluation              AM-PAC OT "6 Clicks" Daily Activity     Outcome Measure Help from another person eating meals?: None Help from another person taking care of personal grooming?: A Little Help from another person toileting, which includes using toliet, bedpan, or urinal?: A Lot Help from another person bathing (including washing, rinsing, drying)?: A Lot Help from another person to put on and taking off regular upper body clothing?: A  Little Help from another person to put on and taking off regular lower body clothing?: A Lot 6 Click Score: 16   End of Session Equipment Utilized During Treatment: Rolling walker (2 wheels);Oxygen Nurse Communication: Mobility status  Activity Tolerance: Patient limited by pain Patient left: in bed;with call bell/phone within reach;with bed alarm set  OT Visit Diagnosis: Other abnormalities of gait and mobility (R26.89);Muscle weakness (generalized) (M62.81) Pain - Right/Left: Right Pain - part of body:  (R ribs)                Time: 4503-8882 OT Time Calculation (min): 91 min Charges:  OT General Charges $OT Visit: 1 Visit OT Evaluation $OT Eval Moderate Complexity: 1 Mod OT  Treatments $Self Care/Home Management : 38-52 mins $Therapeutic Activity: 23-37 mins  Rejeana Brock, MS, OTR/L ascom [phone] 09/04/21, 6:08 PM

## 2021-09-04 NOTE — ED Notes (Signed)
Pt knows need of ua   

## 2021-09-04 NOTE — ED Notes (Signed)
Kara RN aware of assigned bed 

## 2021-09-04 NOTE — Progress Notes (Addendum)
Initial Nutrition Assessment  DOCUMENTATION CODES:   Obesity unspecified  INTERVENTION:   -Glucerna Shake po BID, each supplement provides 220 kcal and 10 grams of protein  -Ensure Max po daily, each supplement provides 150 kcal and 30 grams of protein -MVI with minerals daily  NUTRITION DIAGNOSIS:   Increased nutrient needs related to chronic illness (COPD) as evidenced by estimated needs.  GOAL:   Patient will meet greater than or equal to 90% of their needs  MONITOR:   PO intake, Supplement acceptance, Labs, Weight trends, Skin, I & O's  REASON FOR ASSESSMENT:   Consult Assessment of nutrition requirement/status  ASSESSMENT:   Bethany Villa is a 66 y.o. female seen in ed with complaints of Hypoxia on SNF papers. Pt is alert and oriented but does not know what happened.s he initially states she does not know where she lives. Then she says she lives at facility. When asked she does not feel bad in anyway and does not know what happened. Pt denies any complaints and ros I negative.  Pt admitted with COPD exacerbation.  Pt unavailable at time of visit. RD unable to obtain further nutrition-related history or complete nutrition-focused physical exam at this time.     Per MD notes, pt is non-compliant with CPAP. Pt currently on Bi-pap.   Pt currently on a heart healthy diet. No meal completion data available to assess at this time.   Reviewed wt hx; noted progressive wt gain over the past 6 months.   Pt with increased nutritional needs due to chronic illness and would benefit from addition of oral nutrition supplements.   Medications reviewed and include colace, lactulose, and prednisone.   Labs reviewed: Na: 134.    Diet Order:   Diet Order             Diet Heart Room service appropriate? Yes; Fluid consistency: Thin  Diet effective now                   EDUCATION NEEDS:   No education needs have been identified at this time  Skin:  Skin Assessment:  Reviewed RN Assessment  Last BM:  Unknown  Height:   Ht Readings from Last 1 Encounters:  08/15/21 5\' 4"  (1.626 m)    Weight:   Wt Readings from Last 1 Encounters:  09/03/21 94.8 kg    Ideal Body Weight:  54.5 kg  BMI:  Body mass index is 35.87 kg/m.  Estimated Nutritional Needs:   Kcal:  1700-1900  Protein:  95-110 grams  Fluid:  > 1.7 L    11/01/21, RD, LDN, CDCES Registered Dietitian II Certified Diabetes Care and Education Specialist Please refer to [facility] for RD and/or RD on-call/weekend/after hours pager

## 2021-09-04 NOTE — Evaluation (Signed)
Physical Therapy Evaluation Patient Details Name: Bethany Villa MRN: [id number] DOB: May 09, 1956 Today's Date: 09/04/2021  History of Present Illness  Bethany Villa is a 65yoF who comes to [facility] on 09/03/21 in ARF, SpO2 60s% on RA, HR 120s. PMH: GAD, asthma, chronic pain, CHF, COPD, depression, fibromyalgia, GERD, HA, HLD, LEE, home O2 use, OSA. COVID and Flu both (-). Recent admissions in December 2022 for COPD exacerbation , CHF exacerbation, PNA. Pt resides alone at home with support from sons, but DC to [facility] for STR at last admission.  Clinical Impression  Pt admitted with above diagnosis. Pt currently with functional limitations due to the deficits listed below (see "PT Problem List"). Patient agreeable to PT evaluation. PLOF and home setup obtained. Pt reports some progress with therapies at rehab, but baseline memory difficulty precludes much detail. Pt able to perform bed mobility and STS transfers with out physical assist, but is unsteady and has falls anxiety when up on feet- ultimately balance impairment precludes ability to take small steps. Pt weaned from 4L at entry down to 2L at EOS- on RA it takes ~3 minutes to drop to 90% while resting on gurney. Pt remains off baseline for independence and safety in basic mobility, is making progress since DC to STR, but this recent episode has been a small setback. Pt motivated and agreeable to return to STR to complete rehab stay prior to eventual return to home. Of note- pt has bright red areas on skin, mostly concealed by disposable brief, but notable on left hemipelvis starting at midline ilium from lateral iliac crest to greater trochanter, then extended posterior from there, when seated EOB noted entire area from L4 level down beyond gluteal cleft bilat is erythematous, scaly, pitting, dry in appearance with areas of slight weeping. Patient's assessment this date reveals the patient requires an additional person present for safety and/or  physical assistance to complete their typical ADL. At baseline, the patient is able to perform ADL with modified independence. Patient will benefit from skilled PT intervention to maximize independence and safety in mobility required for basic ADL performance at discharge.          Recommendations for follow up therapy are one component of a multi-disciplinary discharge planning process, led by the attending physician.  Recommendations may be updated based on patient status, additional functional criteria and insurance authorization.  Follow Up Recommendations Skilled nursing-short term rehab (<3 hours/day)    Assistance Recommended at Discharge Frequent or constant Supervision/Assistance  Patient can return home with the following  Two people to help with walking and/or transfers;A lot of help with walking and/or transfers;Help with stairs or ramp for entrance;Direct supervision/assist for medications management;Direct supervision/assist for financial management;Assist for transportation;Assistance with cooking/housework;A lot of help with bathing/dressing/bathroom    Equipment Recommendations None recommended by PT  Recommendations for Other Services       Functional Status Assessment Patient has had a recent decline in their functional status and demonstrates the ability to make significant improvements in function in a reasonable and predictable amount of time.     Precautions / Restrictions Precautions Precautions: Fall      Mobility  Bed Mobility Overal bed mobility: Needs Assistance Bed Mobility: Supine to Sit;Sit to Supine     Supine to sit: Min guard Sit to supine: Min guard   General bed mobility comments: author manages 4+ lines/leads; pt not willing to attempt scoot up in bed due to painful iliac/sacral area redness, edema, wounds.  Transfers Overall transfer level: Needs assistance Equipment used: 1 person hand held assist Transfers: Sit to/from Stand Sit to  Stand: Supervision;Min guard           General transfer comment: rises easily from ED gurney (elevated height) but never confident enough to let go of authors hands despite standing 30+ sec; Pt attempts but is unable to pick feet up for stationary marching due to subjective unsteadiness and concerns of leg buckling.    Ambulation/Gait   Gait Distance (Feet):  (deferred due to pain, unsteadiness.)              Stairs            Wheelchair Mobility    Modified Rankin (Stroke Patients Only)       Balance Overall balance assessment: Needs assistance                                           Pertinent Vitals/Pain Pain Assessment: 0-10 Pain Score: 8  Pain Location: Rt ribs anterior chest wall, just under breast; also has pain near erythematous areas at Left lateral hip, posterior sacral iliac area. Pain Descriptors / Indicators: Aching;Sore;Tender Pain Intervention(s): Limited activity within patient's tolerance    Home Living Family/patient expects to be discharged to:: Skilled nursing facility ([facility]) Living Arrangements: Alone Available Help at Discharge: Family;Available PRN/intermittently Type of Home: Apartment Home Access: Level entry       Home Layout: One level Home Equipment: Grab bars - tub/shower;Shower seat;Grab bars - toilet;Rollator (4 wheels)      Prior Function Prior Level of Function : Independent/Modified Independent             Mobility Comments: Independent with ambulation; denies any recent falls; 2 L O2 at night only ADLs Comments: Indep without assist device for ADLs and household management. Sons assist with driving, community errands/needs.     Hand Dominance   Dominant Hand: Right    Extremity/Trunk Assessment                Communication   Communication: No difficulties  Cognition Arousal/Alertness: Awake/alert Behavior During Therapy: WFL for tasks  assessed/performed Overall Cognitive Status: History of cognitive impairments - at baseline Area of Impairment: Memory                     Memory: Decreased recall of precautions;Decreased short-term memory         General Comments: difficulty with recent report of mobility and events, but does endorse some memory recall difficulty at baseline        General Comments      Exercises Other Exercises Other Exercises: Standing at bedside x60sec with HHA bilat Other Exercises: seated EOB, feet unsupported x 10 minutes   Assessment/Plan    PT Assessment Patient needs continued PT services  PT Problem List Decreased strength;Decreased activity tolerance;Decreased balance;Decreased mobility;Decreased knowledge of use of DME;Pain;Decreased safety awareness;Decreased knowledge of precautions;Cardiopulmonary status limiting activity;Decreased cognition       PT Treatment Interventions DME instruction;Gait training;Functional mobility training;Therapeutic activities;Therapeutic exercise;Balance training;Patient/family education    PT Goals (Current goals can be found in the Care Plan section)  Acute Rehab PT Goals Patient Stated Goal: return to STR and continue to improve mobility, strength and safety PT Goal Formulation: With patient Time For Goal Achievement: 09/18/21 Potential to  Achieve Goals: Fair    Frequency Min 2X/week     Co-evaluation               AM-PAC PT "6 Clicks" Mobility  Outcome Measure Help needed turning from your back to your side while in a flat bed without using bedrails?: A Little Help needed moving from lying on your back to sitting on the side of a flat bed without using bedrails?: A Little Help needed moving to and from a bed to a chair (including a wheelchair)?: A Little Help needed standing up from a chair using your arms (e.g., wheelchair or bedside chair)?: A Lot Help needed to walk in hospital room?: A Lot Help needed climbing 3-5  steps with a railing? : Total 6 Click Score: 14    End of Session Equipment Utilized During Treatment: Oxygen Activity Tolerance: Patient tolerated treatment well;Patient limited by pain Patient left: in bed;with call bell/phone within reach Nurse Communication: Mobility status;Patient requests pain meds PT Visit Diagnosis: Other abnormalities of gait and mobility (R26.89);Muscle weakness (generalized) (M62.81);Unsteadiness on feet (R26.81)    Time: 6789-3810 PT Time Calculation (min) (ACUTE ONLY): 24 min   Charges:   PT Evaluation $PT Eval Moderate Complexity: 1 Mod PT Treatments $Therapeutic Activity: 8-22 mins       1:51 PM, 09/04/21 Rosamaria Lints, PT, DPT Physical Therapist - [facility]  [phone] (ASCOM)    Loretha Ure C 09/04/2021, 1:44 PM

## 2021-09-04 NOTE — ED Notes (Signed)
Pt not given PO meds, desats to 78% when taken off bipap

## 2021-09-04 NOTE — NC FL2 (Signed)
[place] LEVEL OF CARE SCREENING TOOL     IDENTIFICATION  Patient Name: Bethany Villa Birthdate: 1956/05/15 Sex: female Admission Date (Current Location): 09/03/2021  [facility] and [place] Number:  Engineering geologist and Address:  [facility], [address] [place], [place] [address]      Provider Number: [id number]  Attending Physician Name and Address:  Barb Merino, MD  Relative Name and Phone Number:  Brenn Evangelista [phone]    Current Level of Care: Hospital Recommended Level of Care: [place] Prior Approval Number:    Date Approved/Denied:   PASRR Number: [id number]  Discharge Plan: SNF    Current Diagnoses: Patient Active Problem List   Diagnosis Date Noted   Respiratory failure with hypoxia and hypercapnia ([place]) 09/04/2021   Acute exacerbation of COPD with asthma ([place]) 08/15/2021   RUQ pain    Episode of unresponsiveness 08/10/2021   Hyperammonemia ([place]) 08/10/2021   Chronic, continuous use of opioids 08/10/2021   Hyperkalemia 08/10/2021   Acute hypercapnic respiratory failure ([place]) 10/07/2019   Altered mental status    Chronic systolic CHF (congestive heart failure) ([place]) 09/22/2019   Degeneration of cervical intervertebral disc 07/05/2019   Bilateral primary osteoarthritis of knee 04/26/2019   Chronic bilateral thoracic back pain 12/05/2018   Cervicalgia 12/05/2018   Chronic knee pain after total replacement of left knee joint 12/05/2018   Acute adjustment disorder with anxiety 10/06/2017   Palliative care by specialist    Respiratory failure ([place]) 10/01/2017   Elevated brain natriuretic peptide (BNP) level 09/01/2016   LFT elevation 09/01/2016   Total knee replacement status 04/14/2016   Chronic obstructive pulmonary disease with acute lower respiratory infection ([place])    Acute respiratory failure with hypoxia and hypercapnia ([place])    Sepsis ([place]) 09/03/2015   CAP (community acquired  pneumonia) 09/01/2015   SIRS (systemic inflammatory response syndrome) ([place]) 07/24/2014   COPD with acute exacerbation ([place]) 09/29/2013   Acute on chronic respiratory failure with hypercapnia (HCC) 11/27/2012   Insomnia 11/04/2012   Generalized anxiety disorder 11/04/2012   Depression 11/04/2012   Fibromyalgia [id number]   Acute metabolic encephalopathy [id number]   COPD (chronic obstructive pulmonary disease) ([place]) 06/01/2011   Smoking 05/19/2011   Obesity 05/19/2011   Hyperglycemia 05/19/2011    Orientation RESPIRATION BLADDER Height & Weight     Self, Time, Situation, Place  Normal ([place] 3L) Continent Weight: 94.8 kg Height:     BEHAVIORAL SYMPTOMS/MOOD NEUROLOGICAL BOWEL NUTRITION STATUS      Continent Diet (heart healthy)  AMBULATORY STATUS COMMUNICATION OF NEEDS Skin   Extensive Assist Verbally Normal                       Personal Care Assistance Level of Assistance  Bathing, Feeding, Dressing Bathing Assistance: Limited assistance Feeding assistance: Independent Dressing Assistance: Limited assistance     Functional Limitations Info  Sight, Speech, Hearing Sight Info: Adequate Hearing Info: Adequate Speech Info: Adequate    SPECIAL CARE FACTORS FREQUENCY  PT (By licensed PT), OT (By licensed OT)     PT Frequency: 5 times per week OT Frequency: 5 times per week            Contractures Contractures Info: Not present    Additional Factors Info  Code Status, Allergies Code Status Info: Full Allergies Info: Gabapentin, Mobic, PCN, Sulfa           Current Medications (09/04/2021):  This is the current hospital  active medication list Current Facility-Administered Medications  Medication Dose Route Frequency Provider Last Rate Last Admin   acetaminophen (TYLENOL) tablet 650 mg  650 mg Oral Q6H PRN Para Skeans, MD       Or   acetaminophen (TYLENOL) suppository 650 mg  650 mg Rectal Q6H PRN Para Skeans, MD       albuterol (PROVENTIL) (2.5 MG/3ML)  0.083% nebulizer solution 2.5 mg  2.5 mg Nebulization Q2H PRN Para Skeans, MD       bisacodyl (DULCOLAX) EC tablet 5 mg  5 mg Oral Daily PRN Para Skeans, MD       clopidogrel (PLAVIX) tablet 75 mg  75 mg Oral Daily Para Skeans, MD   75 mg at 09/04/21 0924   docusate sodium (COLACE) capsule 100 mg  100 mg Oral BID PRN Para Skeans, MD       docusate sodium (COLACE) capsule 100 mg  100 mg Oral BID Florina Ou V, MD   100 mg at 09/04/21 0924   doxycycline (VIBRA-TABS) tablet 100 mg  100 mg Oral Q12H Para Skeans, MD   100 mg at 09/04/21 0924   feeding supplement (GLUCERNA SHAKE) (GLUCERNA SHAKE) liquid 237 mL  237 mL Oral BID BM Ghimire, Dante Gang, MD       guaiFENesin-dextromethorphan (ROBITUSSIN DM) 100-10 MG/5ML syrup 5 mL  5 mL Oral Q4H PRN Para Skeans, MD       heparin injection 5,000 Units  5,000 Units Subcutaneous Q8H Para Skeans, MD   5,000 Units at 09/04/21 [id number]   hydrALAZINE (APRESOLINE) injection 5 mg  5 mg Intravenous Q4H PRN Para Skeans, MD       ipratropium-albuterol (DUONEB) 0.5-2.5 (3) MG/3ML nebulizer solution 3 mL  3 mL Nebulization Q6H Para Skeans, MD   3 mL at 09/04/21 0805   lactulose (CHRONULAC) 10 GM/15ML solution 20 g  20 g Oral BID Para Skeans, MD       multivitamin with minerals tablet 1 tablet  1 tablet Oral Daily Ghimire, Dante Gang, MD       nicotine (NICODERM CQ - dosed in mg/24 hours) patch 21 mg  21 mg Transdermal Daily Florina Ou V, MD   21 mg at 09/04/21 0925   ondansetron (ZOFRAN) tablet 4 mg  4 mg Oral Q6H PRN Para Skeans, MD       Or   ondansetron (ZOFRAN) injection 4 mg  4 mg Intravenous Q6H PRN Para Skeans, MD       oxyCODONE (Oxy IR/ROXICODONE) immediate release tablet 5 mg  5 mg Oral TID PRN Para Skeans, MD       pantoprazole (PROTONIX) EC tablet 40 mg  40 mg Oral Daily Florina Ou V, MD   40 mg at 09/04/21 0925   polyethylene glycol (MIRALAX / GLYCOLAX) packet 17 g  17 g Oral Daily PRN Para Skeans, MD       [START ON 09/05/2021] predniSONE  (DELTASONE) tablet 40 mg  40 mg Oral Q breakfast Dorothe Pea, RPH       protein supplement (ENSURE MAX) liquid  11 oz Oral QHS Barb Merino, MD       sodium chloride flush (NS) 0.9 % injection 3 mL  3 mL Intravenous Q12H Para Skeans, MD   3 mL at 09/04/21 [id number]   Current Outpatient Medications  Medication Sig Dispense Refill   albuterol (PROVENTIL) (2.5 MG/3ML) 0.083% nebulizer solution Take 3 mLs (  2.5 mg total) by nebulization every 2 (two) hours as needed for shortness of breath or wheezing. 75 mL 12   clopidogrel (PLAVIX) 75 MG tablet Take 1 tablet (75 mg total) by mouth daily. 30 tablet 11   docusate sodium (COLACE) 100 MG capsule Take 1 capsule (100 mg total) by mouth 2 (two) times daily as needed for mild constipation. 10 capsule 0   lactulose (CHRONULAC) 10 GM/15ML solution Take 30 mLs (20 g total) by mouth 2 (two) times daily. 236 mL 0   lidocaine (LIDODERM) 5 % Place 1 patch onto the skin daily. Remove & Discard patch within 12 hours or as directed by MD 30 patch 0   oxyCODONE (OXY IR/ROXICODONE) 5 MG immediate release tablet Take 1 tablet (5 mg total) by mouth 3 (three) times daily as needed for moderate pain. 10 tablet 0   pantoprazole (PROTONIX) 40 MG tablet Take 1 tablet (40 mg total) by mouth daily. 30 tablet 0   guaiFENesin-dextromethorphan (ROBITUSSIN DM) 100-10 MG/5ML syrup Take 5 mLs by mouth every 4 (four) hours as needed for cough. (Patient not taking: Reported on 09/03/2021) 118 mL 0     Discharge Medications: Please see discharge summary for a list of discharge medications.  Relevant Imaging Results:  Relevant Lab Results:   Additional Information SS# [id number]  Shelbie Hutching, RN

## 2021-09-04 NOTE — TOC Initial Note (Signed)
Transition of Care [facility]) - Initial/Assessment Note    Patient Details  Name: Bethany Villa MRN: [id number] Date of Birth: 1956/07/24  [facility] CM/SW Contact:    Shelbie Hutching, RN Phone Number: 09/04/2021, 1:09 PM  Clinical Narrative:                 Patient admitted to the hospital with acute respiratory failure, history of COPD chronic oxygen.  RNCM met with patient at the bedside.  She was recently on 12/27 discharged from the hospital to [facility] or short term rehab.  Patient would like to return to [facility] and continue rehab at discharge.  Patient reports that she does not like the bipap machine, she states she will not wear one at home.    SNF workup started.   Expected Discharge Plan: [place] Barriers to Discharge: Continued Medical Work up   Patient Goals and CMS Choice Patient states their goals for this hospitalization and ongoing recovery are:: Patient wants to return to [facility] for [email or web address] Compare Post Acute Care list provided to:: Patient Choice offered to / list presented to : Patient  Expected Discharge Plan and Services Expected Discharge Plan: [place]   Discharge Planning Services: CM Consult Post Acute Care Choice: [place] Living arrangements for the past 2 months: Apartment                 DME Arranged: N/A DME Agency: NA       HH Arranged: NA [place] Agency: NA        Prior Living Arrangements/Services Living arrangements for the past 2 months: Apartment Lives with:: Self Patient language and need for interpreter reviewed:: Yes Do you feel safe going back to the place where you live?: Yes      Need for Family Participation in Patient Care: Yes (Comment) Care giver support system in place?: Yes (comment) Current home services: DME (oxygen) Criminal Activity/Legal Involvement Pertinent to Current Situation/Hospitalization: No - Comment as  needed  Activities of Daily Living      Permission Sought/Granted Permission sought to share information with : Case Manager, Customer service manager, Family Supports Permission granted to share information with : Yes, Verbal Permission Granted  Share Information with NAME: Bethany Villa  Permission granted to share info w AGENCY: [place] granted to share info w Relationship: son  Permission granted to share info w Contact Information: [phone]  Emotional Assessment Appearance:: Appears stated age Attitude/Demeanor/Rapport: Engaged Affect (typically observed): Accepting Orientation: : Oriented to Self, Oriented to Place, Oriented to  Time, Oriented to Situation Alcohol / Substance Use: Not Applicable Psych Involvement: No (comment)  Admission diagnosis:  Acute respiratory failure with hypoxia and hypercapnia (HCC) [J96.01, J96.02] Respiratory failure with hypoxia and hypercapnia (HCC) [J96.91, J96.92] Patient Active Problem List   Diagnosis Date Noted   Respiratory failure with hypoxia and hypercapnia (HCC) 09/04/2021   Acute exacerbation of COPD with asthma ([place]) 08/15/2021   RUQ pain    Episode of unresponsiveness 08/10/2021   Hyperammonemia ([place]) 08/10/2021   Chronic, continuous use of opioids 08/10/2021   Hyperkalemia 08/10/2021   Acute hypercapnic respiratory failure ([place]) 10/07/2019   Altered mental status    Chronic systolic CHF (congestive heart failure) ([place]) 09/22/2019   Degeneration of cervical intervertebral disc 07/05/2019   Bilateral primary osteoarthritis of knee 04/26/2019   Chronic bilateral thoracic back pain 12/05/2018   Cervicalgia 12/05/2018   Chronic knee  pain after total replacement of left knee joint 12/05/2018   Acute adjustment disorder with anxiety 10/06/2017   Palliative care by specialist    Respiratory failure ([place]) 10/01/2017   Elevated brain natriuretic peptide (BNP) level 09/01/2016   LFT elevation 09/01/2016    Total knee replacement status 04/14/2016   Chronic obstructive pulmonary disease with acute lower respiratory infection ([place])    Acute respiratory failure with hypoxia and hypercapnia ([place])    Sepsis ([place]) 09/03/2015   CAP (community acquired pneumonia) 09/01/2015   SIRS (systemic inflammatory response syndrome) ([place]) 07/24/2014   COPD with acute exacerbation ([place]) 09/29/2013   Acute on chronic respiratory failure with hypercapnia ([place]) 11/27/2012   Insomnia 11/04/2012   Generalized anxiety disorder 11/04/2012   Depression 11/04/2012   Fibromyalgia 91/66/0600   Acute metabolic encephalopathy 45/99/7741   COPD (chronic obstructive pulmonary disease) ([place]) 06/01/2011   Smoking 05/19/2011   Obesity 05/19/2011   Hyperglycemia 05/19/2011   PCP:  Marinda Elk, MD Pharmacy:   [facility] [place], [place] [address] [place] [address] Phone: [phone] Fax: [phone]  [id number] [place] [id number]: [phone]: [phone]    Social Determinants of Health (SDOH) Interventions    Readmission Risk Interventions Readmission Risk Prevention Plan 09/04/2021 08/18/2021  Transportation Screening Complete Complete  PCP or Specialist Appt within 3-5 Days Complete -  [place] or [place] Complete  Social Work Consult for [place]/Counseling Complete Complete  Palliative Care Screening Not Applicable Not Applicable  Medication Review (Press photographer Complete Complete  Some recent data might be hidden

## 2021-09-05 ENCOUNTER — Other Ambulatory Visit: Payer: Self-pay

## 2021-09-05 DIAGNOSIS — J9601 Acute respiratory failure with hypoxia: Secondary | ICD-10-CM | POA: Diagnosis not present

## 2021-09-05 DIAGNOSIS — J9602 Acute respiratory failure with hypercapnia: Secondary | ICD-10-CM | POA: Diagnosis not present

## 2021-09-05 MED ORDER — TRAMADOL HCL 50 MG PO TABS
50.0000 mg | ORAL_TABLET | Freq: Two times a day (BID) | ORAL | Status: DC | PRN
  Administered 2021-09-05 – 2021-09-07 (×5): 50 mg via ORAL
  Filled 2021-09-05 (×6): qty 1

## 2021-09-05 MED ORDER — ZINC OXIDE 40 % EX OINT
TOPICAL_OINTMENT | Freq: Two times a day (BID) | CUTANEOUS | Status: DC
  Filled 2021-09-05: qty 113

## 2021-09-05 MED ORDER — IPRATROPIUM-ALBUTEROL 0.5-2.5 (3) MG/3ML IN SOLN
3.0000 mL | Freq: Three times a day (TID) | RESPIRATORY_TRACT | Status: DC
  Administered 2021-09-05 – 2021-09-07 (×7): 3 mL via RESPIRATORY_TRACT
  Filled 2021-09-05 (×7): qty 3

## 2021-09-05 MED ORDER — NICOTINE 21 MG/24HR TD PT24: 21.0000 mg | MEDICATED_PATCH | Freq: Every day | TRANSDERMAL | 0 refills | Status: AC

## 2021-09-05 MED ORDER — ZINC OXIDE 40 % EX OINT: TOPICAL_OINTMENT | Freq: Two times a day (BID) | CUTANEOUS | 0 refills | Status: DC

## 2021-09-05 NOTE — Progress Notes (Signed)
Pt refuses to wear CPAP. Pt states that it bothers her nose, offered pt a protective padding to assist with that. Pt continues to refuse. Pt aware that a CPAP can be provided for her if she changes her mind. RN aware.

## 2021-09-05 NOTE — TOC Progression Note (Addendum)
Transition of Care [facility]) - Progression Note    Patient Details  Name: Bethany Villa MRN: [id number] Date of Birth: 28-Jan-1956  [facility]) CM/SW Contact  Izola Price, RN Phone Number: 09/05/2021, 9:33 AM  Clinical Narrative:  09/05/21: Provider indicates patient is medically stable for discharge to SNF, but will need CPAP in place. Left VM for Alphia Kava at [facility] at 80 am requesting call back and confidential VM DME CPAP need. Barbie Slater Mcmanaman RN CM    1020 am. DC orders placed. Still waiting to hear back from [facility] Alphia Kava regarding CPCP set up requirements and whether that can happen over weekend. Simmie Davies RN CM  AC contacted RN CM and said cannot take patient until Monday for CPAP and new insurance authorization is needed. Simmie Davies RN CM    Expected Discharge Plan: Skilled Nursing Facility Barriers to Discharge: Continued Medical Work up  Expected Discharge Plan and Services Expected Discharge Plan: [place]   Discharge Planning Services: CM Consult Post Acute Care Choice: [place] Living arrangements for the past 2 months: Apartment                 DME Arranged: N/A DME Agency: NA       HH Arranged: NA HH Agency: NA         Social Determinants of Health (SDOH) Interventions    Readmission Risk Interventions Readmission Risk Prevention Plan 09/04/2021 08/18/2021  Transportation Screening Complete Complete  PCP or Specialist Appt within 3-5 Days Complete -  HRI or [place] Complete Complete  Social Work Consult for [place] Planning/Counseling Complete Complete  Palliative Care Screening Not Applicable Not Applicable  Medication Review Press photographer) Complete Complete  Some recent data might be hidden

## 2021-09-05 NOTE — Discharge Summary (Signed)
Physician Discharge Summary  Basilio Cairoenny E Minor MVH:[id number] DOB: 11/30/1955 DOA: 09/03/2021  PCP: Patrice ParadiseMcLaughlin, Miriam K, MD  Admit date: 09/03/2021 Discharge date: 09/05/2021  Admitted From: Skilled nursing facility Disposition: Skilled nursing facility  Recommendations for Outpatient Follow-up:  Follow up with PCP in 1-2 weeks Must wear CPAP at night  Home Health: N/A Equipment/Devices: N/A  Discharge Condition: Stable CODE STATUS: Full code Diet recommendation: Regular diet  Discharge summary: 66 year old female with dementia, COPD, chronic pain syndrome previously on Suboxone, obstructive sleep apnea intermittently on CPAP who was admitted 2 times last month in the hospital and treated with BiPAP and discharged back SNF on 2 L oxygen comes back with respiratory distress. 08/10/2021- 08/13/2021, treated with BiPAP and weaned to oxygen and discharged home.  12/17-12/27, similar presentation.  Treated with BiPAP and discharged to SNF , not wearing CPAP.   Assessment & plan of care:   Acute on chronic hypoxemic and hypercapnic respiratory failure: Suspect multifactorial respiratory failure.  Likely untreated sleep apnea. Untreated sleep apnea. Previous multiple admissions on BiPAP but non compliant to CPAP.  Keep on oxygen to keep saturation more than 90%. Continue supportive therapy with bronchodilator treatment.  Use CPAP consistent at night and oxygen in day time.  Extensively counseled and agreeable.   Chronic systolic congestive heart failure: Currently euvolemic.   Chronic pain syndrome: Tylenol as needed.  Patient is not on any narcotics/taken off Suboxone patches.  Pain is controlled.  Cognitive dysfunction: progressive memory loss. Non compliance.  Will need neurology evaluation once all medical issues resolved.  Skin rashes: Use zinc oxide cream.  Avoid moisture.   Her readmissions are likely related to not using CPAP at night.  Will ensure that she uses CPAP at night and  they have it available at the rehab center before discharging patient. Transfer to short-term rehab when available.    Discharge Diagnoses:  Principal Problem:   Acute respiratory failure with hypoxia and hypercapnia (HCC) Active Problems:   Smoking   COPD (chronic obstructive pulmonary disease) (HCC)   COPD with acute exacerbation (HCC)   Respiratory failure with hypoxia and hypercapnia (HCC)    Discharge Instructions  Discharge Instructions     Diet - low sodium heart healthy   Complete by: As directed    Discharge instructions   Complete by: As directed    Must wear CPAP at night for at least 6-8 hours   Increase activity slowly   Complete by: As directed       Allergies as of 09/05/2021       Reactions   Gabapentin Shortness Of Breath, Rash   Mobic [meloxicam] Anaphylaxis   Penicillins Shortness Of Breath, Swelling   Tolerated Zosyn December 2022 Has patient had a PCN reaction causing immediate rash, facial/tongue/throat swelling, SOB or lightheadedness with hypotension: unknown Has patient had a PCN reaction causing severe rash involving mucus membranes or skin necrosis: {unknown Has patient had a PCN reaction that required hospitalization {unknown Has patient had a PCN reaction occurring within the last 10 years: no If all of the above answers are "NO", then may proceed with Cephalosporin use.   Sulfa Antibiotics Shortness Of Breath, Swelling        Medication List     STOP taking these medications    oxyCODONE 5 MG immediate release tablet Commonly known as: Oxy IR/ROXICODONE       TAKE these medications    albuterol (2.5 MG/3ML) 0.083% nebulizer solution Commonly known as: PROVENTIL Take 3 mLs (2.5  mg total) by nebulization every 2 (two) hours as needed for shortness of breath or wheezing.   clopidogrel 75 MG tablet Commonly known as: Plavix Take 1 tablet (75 mg total) by mouth daily.   docusate sodium 100 MG capsule Commonly known as:  COLACE Take 1 capsule (100 mg total) by mouth 2 (two) times daily as needed for mild constipation.   guaiFENesin-dextromethorphan 100-10 MG/5ML syrup Commonly known as: ROBITUSSIN DM Take 5 mLs by mouth every 4 (four) hours as needed for cough.   lactulose 10 GM/15ML solution Commonly known as: CHRONULAC Take 30 mLs (20 g total) by mouth 2 (two) times daily.   lidocaine 5 % Commonly known as: LIDODERM Place 1 patch onto the skin daily. Remove & Discard patch within 12 hours or as directed by MD   liver oil-zinc oxide 40 % ointment Commonly known as: DESITIN Apply topically 2 (two) times daily. Apply to rashes on back and flanks   nicotine 21 mg/24hr patch Commonly known as: NICODERM CQ - dosed in mg/24 hours Place 1 patch (21 mg total) onto the skin daily.   pantoprazole 40 MG tablet Commonly known as: PROTONIX Take 1 tablet (40 mg total) by mouth daily.        Follow-up Information     [name], MD Follow up in 2 week(s).   Specialty: Physician Assistant Contact information: [phone] [facility] [place] [address] [phone]                Allergies  Allergen Reactions   Gabapentin Shortness Of Breath and Rash   Mobic [Meloxicam] Anaphylaxis   Penicillins Shortness Of Breath and Swelling    Tolerated Zosyn December 2022  Has patient had a PCN reaction causing immediate rash, facial/tongue/throat swelling, SOB or lightheadedness with hypotension: unknown Has patient had a PCN reaction causing severe rash involving mucus membranes or skin necrosis: {unknown Has patient had a PCN reaction that required hospitalization {unknown Has patient had a PCN reaction occurring within the last 10 years: no If all of the above answers are "NO", then may proceed with Cephalosporin use.   Sulfa Antibiotics Shortness Of Breath and Swelling    Consultations: None   Procedures/Studies: DG Ribs Unilateral Right  Result Date:  08/12/2021 CLINICAL DATA:  Right-greater-than-left rib pain. EXAM: LEFT RIBS AND CHEST - 3+ VIEW; RIGHT RIBS - 2 VIEW COMPARISON:  Chest x-ray dated August 10, 2021. FINDINGS: Acute nondisplaced fracture of the right lateral sixth rib. No left-sided rib fracture. Stable cardiomediastinal silhouette. Patchy interstitial opacities in the left greater than right upper lobes have improved. No pleural effusion or pneumothorax. IMPRESSION: 1. Acute nondisplaced fracture of the right lateral sixth rib. 2. Improved bilateral upper lobe pneumonia. Electronically Signed   By: Obie Dredge M.D.   On: 08/12/2021 12:33   DG Ribs Unilateral W/Chest Left  Result Date: 08/12/2021 CLINICAL DATA:  Right-greater-than-left rib pain. EXAM: LEFT RIBS AND CHEST - 3+ VIEW; RIGHT RIBS - 2 VIEW COMPARISON:  Chest x-ray dated August 10, 2021. FINDINGS: Acute nondisplaced fracture of the right lateral sixth rib. No left-sided rib fracture. Stable cardiomediastinal silhouette. Patchy interstitial opacities in the left greater than right upper lobes have improved. No pleural effusion or pneumothorax. IMPRESSION: 1. Acute nondisplaced fracture of the right lateral sixth rib. 2. Improved bilateral upper lobe pneumonia. Electronically Signed   By: Obie Dredge M.D.   On: 08/12/2021 12:33   CT HEAD WO CONTRAST ( )  Result Date: 08/10/2021  CLINICAL DATA:  Mental status change. EXAM: CT HEAD WITHOUT CONTRAST TECHNIQUE: Contiguous axial images were obtained from the base of the skull through the vertex without intravenous contrast. COMPARISON:  Head CT dated October 07, 2019 FINDINGS: Brain: No evidence of acute infarction, hemorrhage, hydrocephalus, extra-axial collection or mass lesion/mass effect. Vascular: No hyperdense vessel or unexpected calcification. Skull: Normal. Negative for fracture or focal lesion. Sinuses/Orbits: No acute finding. Other: None. IMPRESSION: No acute intracranial abnormality. Electronically Signed    By: Allegra Lai M.D.   On: 08/10/2021 21:11   CT Angio Chest Pulmonary Embolism (PE) W or WO Contrast  Result Date: 09/04/2021 CLINICAL DATA:  Respiratory failure, respiratory distress with oxygen saturation in the 60s on room air EXAM: CT ANGIOGRAPHY CHEST WITH CONTRAST TECHNIQUE: Multidetector CT imaging of the chest was performed using the standard protocol during bolus administration of intravenous contrast. Multiplanar CT image reconstructions and MIPs were obtained to evaluate the vascular anatomy. CONTRAST:  68mL OMNIPAQUE IOHEXOL 350 MG/ML SOLN IV COMPARISON:  CT chest 09/04/2015 FINDINGS: Cardiovascular: Atherosclerotic calcifications aorta and coronary arteries. Aorta normal caliber without aneurysm or dissection. Heart unremarkable. No pericardial effusion. Pulmonary arteries adequately opacified and patent. No evidence of pulmonary embolism. Mediastinum/Nodes: Base of cervical region normal appearance. No thoracic adenopathy. Esophagus unremarkable. Lungs/Pleura: Emphysematous changes. Central peribronchial thickening. Scattered interstitial prominence. No acute infiltrate, pleural effusion, or pneumothorax. Upper Abdomen: Nodular appearing liver suggestive of cirrhosis. Spleen appears enlarged, 14.8 cm in greatest axial dimension, craniocaudal length not assessed. Small hypervascular focus lateral spleen 9 mm diameter, nonspecific but unchanged. Small splenule adjacent to spleen. Musculoskeletal: No acute osseous findings. Multiple healing LEFT rib fractures. Review of the MIP images confirms the above findings. IMPRESSION: No evidence of pulmonary embolism. Nodular hepatic contours question cirrhosis. Probable splenomegaly. Aortic Atherosclerosis (ICD10-I70.0) and Emphysema (ICD10-J43.9). Electronically Signed   By: Ulyses Southward M.D.   On: 09/04/2021 10:07   NM Hepatobiliary Liver Func  Result Date: 08/12/2021 CLINICAL DATA:  Right upper quadrant pain, cholelithiasis EXAM: NUCLEAR MEDICINE  HEPATOBILIARY IMAGING TECHNIQUE: Sequential images of the abdomen were obtained out to 60 minutes following intravenous administration of radiopharmaceutical. RADIOPHARMACEUTICALS:  5.29 mCi Tc-90m  Choletec IV COMPARISON:  08/11/2021 FINDINGS: Prompt uptake and biliary excretion of activity by the liver is seen. Gallbladder activity is visualized, consistent with patency of cystic duct. Biliary activity passes into small bowel, consistent with patent common bile duct. IMPRESSION: 1. Normal hepatobiliary scan.  No evidence of acute cholecystitis. Electronically Signed   By: Sharlet Salina M.D.   On: 08/12/2021 15:59   DG Chest Portable 1 View  Result Date: 09/03/2021 CLINICAL DATA:  COPD, on BiPAP. EXAM: PORTABLE CHEST 1 VIEW COMPARISON:  Radiograph 08/21/2021 FINDINGS: Normal heart size and mediastinal contours. Chronic peribronchial thickening. Mild streaky scarring in the suprahilar right lung. No confluent consolidation, pleural effusion, or pneumothorax. Stable osseous structures. IMPRESSION: Chronic peribronchial thickening. Electronically Signed   By: Narda Rutherford M.D.   On: 09/03/2021 18:17   DG Chest Port 1 View  Result Date: 08/21/2021 CLINICAL DATA:  Shortness of breath EXAM: PORTABLE CHEST 1 VIEW COMPARISON:  08/15/2021 FINDINGS: The heart size and mediastinal contours are within normal limits. Chronic interstitial changes. Pulmonary vascular congestion. Left upper lung cavitary structure on the prior study is not identified. No pleural effusion or pneumothorax. Normal heart size. IMPRESSION: COPD.  Pulmonary vascular congestion.  No focal consolidation. Electronically Signed   By: Guadlupe Spanish M.D.   On: 08/21/2021 08:43   DG Chest Portable  1 View  Result Date: 08/15/2021 CLINICAL DATA:  Respiratory distress. EXAM: PORTABLE CHEST 1 VIEW COMPARISON:  08/12/2021 and older exams. FINDINGS: Cardiac silhouette is normal in size. No mediastinal or hilar masses. Lungs demonstrate vascular  interstitial prominence. There are no areas of lung consolidation. No convincing pleural effusion and no pneumothorax. Skeletal structures are grossly intact. IMPRESSION: 1. No convincing acute cardiopulmonary disease. Electronically Signed   By: Amie Portland M.D.   On: 08/15/2021 16:06   DG Chest Portable 1 View  Result Date: 08/10/2021 CLINICAL DATA:  Shortness of breath EXAM: PORTABLE CHEST 1 VIEW COMPARISON:  08/10/2021 FINDINGS: NG tube is in the stomach. Heart is normal size. Patchy opacities in the upper lobes, left greater than right. No effusions. No acute bony abnormality. IMPRESSION: Patchy opacities in the upper lobes, left greater than right concerning for pneumonia. Electronically Signed   By: Charlett Nose M.D.   On: 08/10/2021 22:26   DG Chest Portable 1 View  Result Date: 08/10/2021 CLINICAL DATA:  Shortness of breath EXAM: PORTABLE CHEST 1 VIEW COMPARISON:  Chest x-ray dated June 27, 2020 FINDINGS: Cardiac and mediastinal contours are unchanged. Upper lung predominant heterogeneous opacities. No evidence of pleural effusion or pneumothorax. IMPRESSION: Upper lung predominant heterogeneous opacities, concerning for infection. Electronically Signed   By: Allegra Lai M.D.   On: 08/10/2021 19:01   DG Abd Portable 1 View  Result Date: 08/10/2021 CLINICAL DATA:  Shortness of breath EXAM: PORTABLE ABDOMEN - 1 VIEW COMPARISON:  Chest x-ray 06/10/2020, CT 08/27/2011 FINDINGS: Esophageal tube looped over the epigastric area with tip projecting over distal stomach, side-port at the level of GE junction. There may be a small hiatal hernia. IMPRESSION: Esophageal tube is looped upon itself over the epigastric area with the tip projecting over the distal stomach Electronically Signed   By: Jasmine Pang M.D.   On: 08/10/2021 22:28   ECHOCARDIOGRAM COMPLETE  Result Date: 08/13/2021    ECHOCARDIOGRAM REPORT   Patient Name:   Bethany Villa Date of Exam: 08/13/2021 Medical Rec #:   [id number]      Height:       64.0 in Accession #:    [id number]     Weight:       180.0 lb Date of Birth:  03-Mar-1956      BSA:          1.871 m Patient Age:    65 years       BP:           152/98 mmHg Patient Gender: F              HR:           73 bpm. Exam Location:  [facility] Procedure: 2D Echo, Color Doppler, Cardiac Doppler and Intracardiac            Opacification Agent Indications:     R94.31 Abnormal ECG  History:         Patient has prior history of Echocardiogram examinations. COPD;                  Risk Factors:Dyslipidemia.  Sonographer:     Humphrey Rolls Referring Phys:  [id number] AMIN Diagnosing Phys: Arnoldo Hooker MD  Sonographer Comments: Technically difficult study due to poor echo windows. Image acquisition challenging due to patient body habitus and Image acquisition challenging due to COPD. IMPRESSIONS  1. Left ventricular ejection fraction, by estimation, is 60 to 65%. The left ventricle has normal function.  The left ventricle has no regional wall motion abnormalities. Left ventricular diastolic parameters were normal.  2. Right ventricular systolic function is normal. The right ventricular size is normal.  3. The mitral valve is normal in structure. Trivial mitral valve regurgitation.  4. The aortic valve is normal in structure. Aortic valve regurgitation is not visualized. FINDINGS  Left Ventricle: Left ventricular ejection fraction, by estimation, is 60 to 65%. The left ventricle has normal function. The left ventricle has no regional wall motion abnormalities. Definity contrast agent was given IV to delineate the left ventricular  endocardial borders. The left ventricular internal cavity size was normal in size. There is no left ventricular hypertrophy. Left ventricular diastolic parameters were normal. Right Ventricle: The right ventricular size is normal. No increase in right ventricular wall thickness. Right ventricular systolic function is normal. Left Atrium: Left atrial size was normal  in size. Right Atrium: Right atrial size was normal in size. Pericardium: There is no evidence of pericardial effusion. Mitral Valve: The mitral valve is normal in structure. Trivial mitral valve regurgitation. MV peak gradient, 6.7 mmHg. The mean mitral valve gradient is 3.0 mmHg. Tricuspid Valve: The tricuspid valve is normal in structure. Tricuspid valve regurgitation is trivial. Aortic Valve: The aortic valve is normal in structure. Aortic valve regurgitation is not visualized. Aortic valve mean gradient measures 5.0 mmHg. Aortic valve peak gradient measures 13.1 mmHg. Aortic valve area, by VTI measures 2.17 cm. Pulmonic Valve: The pulmonic valve was normal in structure. Pulmonic valve regurgitation is not visualized. Aorta: The aortic root and ascending aorta are structurally normal, with no evidence of dilitation. IAS/Shunts: No atrial level shunt detected by color flow Doppler.  LEFT VENTRICLE PLAX 2D LVIDd:         3.10 cm   Diastology LVIDs:         2.23 cm   LV e' medial:    6.74 cm/s LV PW:         1.08 cm   LV E/e' medial:  14.3 LV IVS:        0.92 cm   LV e' lateral:   7.83 cm/s LVOT diam:     2.10 cm   LV E/e' lateral: 12.3 LV SV:         70 LV SV Index:   37 LVOT Area:     3.46 cm  LEFT ATRIUM         Index LA diam:    3.20 cm 1.71 cm/m  AORTIC VALVE                     PULMONIC VALVE AV Area (Vmax):    1.99 cm      PV Vmax:       1.11 m/s AV Area (Vmean):   2.18 cm      PV Vmean:      69.800 cm/s AV Area (VTI):     2.17 cm      PV VTI:        0.194 m AV Vmax:           181.00 cm/s   PV Peak grad:  4.9 mmHg AV Vmean:          107.000 cm/s  PV Mean grad:  2.0 mmHg AV VTI:            0.323 m AV Peak Grad:      13.1 mmHg AV Mean Grad:      5.0 mmHg LVOT Vmax:  104.00 cm/s LVOT Vmean:        67.500 cm/s LVOT VTI:          0.202 m LVOT/AV VTI ratio: 0.63  AORTA Ao Root diam: 2.80 cm MITRAL VALVE MV Area (PHT): 4.33 cm     SHUNTS MV Area VTI:   2.01 cm     Systemic VTI:  0.20 m MV Peak grad:   6.7 mmHg     Systemic Diam: 2.10 cm MV Mean grad:  3.0 mmHg MV Vmax:       1.29 m/s MV Vmean:      79.0 cm/s MV Decel Time: 175 msec MV E velocity: 96.40 cm/s MV A velocity: 117.00 cm/s MV E/A ratio:  0.82 Arnoldo Hooker MD Electronically signed by Arnoldo Hooker MD Signature Date/Time: 08/13/2021/12:41:56 PM    Final    US Abdomen Limited RUQ (LIVER/GB)  Result Date: 08/11/2021 CLINICAL DATA:  Increased ammonia levels EXAM: ULTRASOUND ABDOMEN LIMITED RIGHT UPPER QUADRANT COMPARISON:  No prior abdominal ultrasound, FINDINGS: Gallbladder: Multiple gallstones, measuring up to 1.8 cm. No wall thickening or pericholecystic fluid. Negative Murphy sign. Common bile duct: Diameter: 4 mm Liver: Mild nodularity of the liver borders. No focal lesion identified. The liver is normal in echogenicity. Portal vein is patent on color Doppler imaging with normal direction of blood flow towards the liver. Other: Evaluation is somewhat limited as the patient was not able to be put in the decubitus position. IMPRESSION: 1. Cholelithiasis without evidence of cholecystitis. 2. Mild hepatic nodularity, as can be seen with cirrhosis. Normal liver echogenicity. No focal lesion. Electronically Signed   By: Wiliam Ke M.D.   On: 08/11/2021 03:40   (Echo, Carotid, EGD, Colonoscopy, ERCP)    Subjective: Patient seen and examined.  Today she is alert awake eating breakfast.  She is interactive.  She tells me she does not have CPAP at home. Patient tells me that she will use CPAP if they can probably remove it in the morning.  Very pleasant today.  On 2 L oxygen.   Discharge Exam: Vitals:   09/05/21 0507 09/05/21 0826  BP: 125/62 137/86  Pulse: 68 80  Resp: 16 20  Temp: 97.6 F (36.4 C) 97.8 F (36.6 C)  SpO2: 99% 100%   Vitals:   09/05/21 0025 09/05/21 0321 09/05/21 0507 09/05/21 0826  BP: 131/70  125/62 137/86  Pulse: 80  68 80  Resp: 19  16 20   Temp: 98.1 F (36.7 C)  97.6 F (36.4 C) 97.8 F (36.6 C)   TempSrc: Oral  Oral Oral  SpO2: 99% 97% 99% 100%  Weight: 94.8 kg     Height: 5\' 4"  (1.626 m)       General: Pt is alert, awake, not in acute distress Comfortable on 2 L oxygen.  Eating breakfast. Cardiovascular: RRR, S1/S2 +, no rubs, no gallops Respiratory: CTA bilaterally, no wheezing, no rhonchi Abdominal: Soft, NT, ND, bowel sounds + Extremities: no edema, no cyanosis Patient has erythematous rashes on right flank and right side of the buttock which is probably due to moisture.  No open wounds.    The results of significant diagnostics from this hospitalization (including imaging, microbiology, ancillary and laboratory) are listed below for reference.     Microbiology: Recent Results (from the past 240 hour(s))  Resp Panel by RT-PCR (Flu A&B, Covid) Nasopharyngeal Swab     Status: None   Collection Time: 09/03/21  6:23 PM   Specimen: Nasopharyngeal Swab; Nasopharyngeal(NP) swabs in vial transport  medium  Result Value Ref Range Status   SARS Coronavirus 2 by RT PCR NEGATIVE NEGATIVE Final    Comment: (NOTE) SARS-CoV-2 target nucleic acids are NOT DETECTED.  The SARS-CoV-2 RNA is generally detectable in upper respiratory specimens during the acute phase of infection. The lowest concentration of SARS-CoV-2 viral copies this assay can detect is 138 copies/mL. A negative result does not preclude SARS-Cov-2 infection and should not be used as the sole basis for treatment or other patient management decisions. A negative result may occur with  improper specimen collection/handling, submission of specimen other than nasopharyngeal swab, presence of viral mutation(s) within the areas targeted by this assay, and inadequate number of viral copies(<138 copies/mL). A negative result must be combined with clinical observations, patient history, and epidemiological information. The expected result is Negative.  Fact Sheet for Patients:   BloggerCourse.com  Fact Sheet for Healthcare Providers:  SeriousBroker.it  This test is no t yet approved or cleared by the [place] FDA and  has been authorized for detection and/or diagnosis of SARS-CoV-2 by FDA under an Emergency Use Authorization (EUA). This EUA will remain  in effect (meaning this test can be used) for the duration of the COVID-19 declaration under Section 564(b)(1) of the Act, 21 U.S.C.section 360bbb-3(b)(1), unless the authorization is terminated  or revoked sooner.       Influenza A by PCR NEGATIVE NEGATIVE Final   Influenza B by PCR NEGATIVE NEGATIVE Final    Comment: (NOTE) The Xpert Xpress SARS-CoV-2/FLU/RSV plus assay is intended as an aid in the diagnosis of influenza from Nasopharyngeal swab specimens and should not be used as a sole basis for treatment. Nasal washings and aspirates are unacceptable for Xpert Xpress SARS-CoV-2/FLU/RSV testing.  Fact Sheet for Patients: BloggerCourse.com  Fact Sheet for Healthcare Providers: SeriousBroker.it  This test is not yet approved or cleared by the [place] FDA and has been authorized for detection and/or diagnosis of SARS-CoV-2 by FDA under an Emergency Use Authorization (EUA). This EUA will remain in effect (meaning this test can be used) for the duration of the COVID-19 declaration under Section 564(b)(1) of the Act, 21 U.S.C. section 360bbb-3(b)(1), unless the authorization is terminated or revoked.  Performed at [facility], [address] [place], [place] [address]      Labs: BNP (last 3 results) Recent Labs    08/13/21 0327 08/15/21 1547 09/03/21 1743  BNP 325.2* 230.5* 217.5*   Basic Metabolic Panel: Recent Labs  Lab 09/03/21 1743 09/04/21 1034  NA 134* 135  K 4.0 3.9  CL 91* 90*  CO2 34* 37*  GLUCOSE 160* 192*  BUN 17 16  CREATININE 0.55 0.50  CALCIUM  8.8* 8.6*  MG 2.4  --    Liver Function Tests: Recent Labs  Lab 09/03/21 1743  AST 21  ALT 15  ALKPHOS 102  BILITOT 0.9  PROT 6.4*  ALBUMIN 3.0*   No results for input(s): LIPASE, AMYLASE in the last 168 hours. No results for input(s): AMMONIA in the last 168 hours. CBC: Recent Labs  Lab 09/03/21 1743 09/04/21 1034  WBC 5.8 3.9*  NEUTROABS 4.2  --   HGB 11.7* 11.3*  HCT 38.0 36.3  MCV 95.2 92.1  PLT 185 165   Cardiac Enzymes: No results for input(s): CKTOTAL, CKMB, CKMBINDEX, TROPONINI in the last 168 hours. BNP: Invalid input(s): POCBNP CBG: No results for input(s): GLUCAP in the last 168 hours. D-Dimer Recent Labs    09/03/21 1743  DDIMER 0.58*  Hgb A1c No results for input(s): HGBA1C in the last 72 hours. Lipid Profile No results for input(s): CHOL, HDL, LDLCALC, TRIG, CHOLHDL, LDLDIRECT in the last 72 hours. Thyroid function studies No results for input(s): TSH, T4TOTAL, T3FREE, THYROIDAB in the last 72 hours.  Invalid input(s): FREET3 Anemia work up No results for input(s): VITAMINB12, FOLATE, FERRITIN, TIBC, IRON, RETICCTPCT in the last 72 hours. Urinalysis    Component Value Date/Time   COLORURINE YELLOW (A) 08/10/2021 1832   APPEARANCEUR CLEAR (A) 08/10/2021 1832   APPEARANCEUR Hazy 08/22/2013 1111   LABSPEC 1.008 08/10/2021 1832   LABSPEC 1.016 08/22/2013 1111   PHURINE 8.0 08/10/2021 1832   GLUCOSEU NEGATIVE 08/10/2021 1832   GLUCOSEU Negative 08/22/2013 1111   HGBUR NEGATIVE 08/10/2021 1832   BILIRUBINUR NEGATIVE 08/10/2021 1832   BILIRUBINUR Negative 08/22/2013 1111   KETONESUR NEGATIVE 08/10/2021 1832   PROTEINUR NEGATIVE 08/10/2021 1832   UROBILINOGEN 0.2 07/24/2014 0635   NITRITE NEGATIVE 08/10/2021 1832   LEUKOCYTESUR NEGATIVE 08/10/2021 1832   LEUKOCYTESUR 1+ 08/22/2013 1111   Sepsis Labs Invalid input(s): PROCALCITONIN,  WBC,  LACTICIDVEN Microbiology Recent Results (from the past 240 hour(s))  Resp Panel by RT-PCR (Flu  A&B, Covid) Nasopharyngeal Swab     Status: None   Collection Time: 09/03/21  6:23 PM   Specimen: Nasopharyngeal Swab; Nasopharyngeal(NP) swabs in vial transport medium  Result Value Ref Range Status   SARS Coronavirus 2 by RT PCR NEGATIVE NEGATIVE Final    Comment: (NOTE) SARS-CoV-2 target nucleic acids are NOT DETECTED.  The SARS-CoV-2 RNA is generally detectable in upper respiratory specimens during the acute phase of infection. The lowest concentration of SARS-CoV-2 viral copies this assay can detect is 138 copies/mL. A negative result does not preclude SARS-Cov-2 infection and should not be used as the sole basis for treatment or other patient management decisions. A negative result may occur with  improper specimen collection/handling, submission of specimen other than nasopharyngeal swab, presence of viral mutation(s) within the areas targeted by this assay, and inadequate number of viral copies(<138 copies/mL). A negative result must be combined with clinical observations, patient history, and epidemiological information. The expected result is Negative.  Fact Sheet for Patients:  BloggerCourse.com  Fact Sheet for Healthcare Providers:  SeriousBroker.it  This test is no t yet approved or cleared by the [place] FDA and  has been authorized for detection and/or diagnosis of SARS-CoV-2 by FDA under an Emergency Use Authorization (EUA). This EUA will remain  in effect (meaning this test can be used) for the duration of the COVID-19 declaration under Section 564(b)(1) of the Act, 21 U.S.C.section 360bbb-3(b)(1), unless the authorization is terminated  or revoked sooner.       Influenza A by PCR NEGATIVE NEGATIVE Final   Influenza B by PCR NEGATIVE NEGATIVE Final    Comment: (NOTE) The Xpert Xpress SARS-CoV-2/FLU/RSV plus assay is intended as an aid in the diagnosis of influenza from Nasopharyngeal swab specimens  and should not be used as a sole basis for treatment. Nasal washings and aspirates are unacceptable for Xpert Xpress SARS-CoV-2/FLU/RSV testing.  Fact Sheet for Patients: BloggerCourse.com  Fact Sheet for Healthcare Providers: SeriousBroker.it  This test is not yet approved or cleared by the [place] FDA and has been authorized for detection and/or diagnosis of SARS-CoV-2 by FDA under an Emergency Use Authorization (EUA). This EUA will remain in effect (meaning this test can be used) for the duration of the COVID-19 declaration under Section 564(b)(1) of the Act, 21 U.S.C.  section 360bbb-3(b)(1), unless the authorization is terminated or revoked.  Performed at [facility], [address], [place], [place] [address]      Time coordinating discharge:  40 minutes  SIGNED:   Dorcas Carrow, MD  [facility] 09/05/2021, 11:27 AM

## 2021-09-05 NOTE — Progress Notes (Signed)
Notified by RT that patient is refusing to wear CPAP machine because she wants "the spot on her nose to heal." Provided patient with education about specific risks with not using the CPAP consistently and strongly encouraged her to allow [place] to place a protective padding over her nose, so that she could still wear it and she continues to refuse.RT states to call if pt changes her mind.

## 2021-09-05 NOTE — Progress Notes (Signed)
Patient with c/o pain to right flank. Petechiae noted on examination. Red, desquamating rash noted to lower back, buttocks, perineal area and inner thighs. Patient denies that rash is painful to touch or itchy and does not know how long its been there. Cleansed after incontinent episode and moisture barrier applied with relief.

## 2021-09-06 DIAGNOSIS — J9601 Acute respiratory failure with hypoxia: Secondary | ICD-10-CM | POA: Diagnosis not present

## 2021-09-06 DIAGNOSIS — J9602 Acute respiratory failure with hypercapnia: Secondary | ICD-10-CM | POA: Diagnosis not present

## 2021-09-06 MED ORDER — ACETAMINOPHEN 500 MG PO TABS
1000.0000 mg | ORAL_TABLET | Freq: Four times a day (QID) | ORAL | Status: DC | PRN
  Administered 2021-09-06 – 2021-09-07 (×5): 1000 mg via ORAL
  Filled 2021-09-06 (×5): qty 2

## 2021-09-06 MED ORDER — OXYCODONE HCL 5 MG PO TABS
5.0000 mg | ORAL_TABLET | Freq: Once | ORAL | Status: AC
  Administered 2021-09-06: 5 mg via ORAL
  Filled 2021-09-06: qty 1

## 2021-09-06 MED ORDER — LIDOCAINE 5 % EX PTCH
1.0000 | MEDICATED_PATCH | CUTANEOUS | Status: DC
  Administered 2021-09-06 – 2021-09-07 (×2): 1 via TRANSDERMAL
  Filled 2021-09-06 (×3): qty 1

## 2021-09-06 NOTE — Progress Notes (Signed)
Patient assisted back to bed from bathroom following bowel movement. Patient noted to have extensive moisture associated skin breakdown to buttocks, groin and back of upper thighs. Skin bleeding to touch. [place] applied per order.

## 2021-09-06 NOTE — Progress Notes (Signed)
PROGRESS NOTE    Bethany Villa  [id number] DOB: February 01, 1956 DOA: 09/03/2021 PCP: Marinda Elk, MD    Brief Narrative:  Recurrent admission due to not wearing CPAP.   Assessment & Plan:   Principal Problem:   Acute respiratory failure with hypoxia and hypercapnia (HCC) Active Problems:   Smoking   COPD (chronic obstructive pulmonary disease) (HCC)   COPD with acute exacerbation (HCC)   Respiratory failure with hypoxia and hypercapnia (HCC)  Acute on chronic hypoxemic and hypercapnic respiratory failure Chronic systolic congestive heart failure Chronic pain syndrome, off narcotics now Cognitive dysfunction Skin rashes  Counseled to use CPAP at night.  She is going to use it tonight. Tylenol, Lidoderm patch and low-dose tramadol for right chest pain.  Avoid other narcotics. Stable to discharge back to a skilled nursing facility to continue rehab.  DVT prophylaxis: heparin injection 5,000 Units Start: 09/03/21 2300 SCDs Start: 09/03/21 2221   Code Status: Full code Family Communication: None Disposition Plan: Status is: Inpatient  Remains inpatient appropriate because: Unsafe discharge plan.  Insurance prior authorization.         Consultants:  None  Procedures:  None  Antimicrobials:  Discontinued   Subjective: Patient seen and examined.  She is complaining of right chest wall pain and previously using pain medication for that.  Denies any other area chest pain or shortness of breath. She declined to use CPAP last night stating abrasion on her bridge of the nose. We discussed about using padded dressing and using CPAP tonight and she is agreeable.  Objective: Vitals:   09/05/21 2328 09/06/21 0357 09/06/21 0726 09/06/21 0801  BP: (!) 103/57 (!) 97/56  104/70  Pulse: 95 87  90  Resp: 16 15  20   Temp: 97.9 F (36.6 C) 97.8 F (36.6 C)  97.9 F (36.6 C)  TempSrc: Oral Oral  Oral  SpO2: 97% 98% 98% 98%  Weight:      Height:         Intake/Output Summary (Last 24 hours) at 09/06/2021 1121 Last data filed at 09/06/2021 1050 Gross per 24 hour  Intake 720 ml  Output 875 ml  Net -155 ml   Filed Weights   09/03/21 1739 09/05/21 0025  Weight: 94.8 kg 94.8 kg    Examination:  Looks fairly comfortable.  On 2 L oxygen. She has a healed abrasion on the bridge of the nose. Patient has some skin rashes on the right flank and buttock area improving with Desitin.    Data Reviewed: I have personally reviewed following labs and imaging studies  CBC: Recent Labs  Lab 09/03/21 1743 09/04/21 1034  WBC 5.8 3.9*  NEUTROABS 4.2  --   HGB 11.7* 11.3*  HCT 38.0 36.3  MCV 95.2 92.1  PLT 185 [id number]   Basic Metabolic Panel: Recent Labs  Lab 09/03/21 1743 09/04/21 1034  NA 134* 135  K 4.0 3.9  CL 91* 90*  CO2 34* 37*  GLUCOSE 160* 192*  BUN 17 16  CREATININE 0.55 0.50  CALCIUM 8.8* 8.6*  MG 2.4  --    GFR: Estimated Creatinine Clearance: 79.3 mL/min (by C-G formula based on SCr of 0.5 mg/dL). Liver Function Tests: Recent Labs  Lab 09/03/21 1743  AST 21  ALT 15  ALKPHOS 102  BILITOT 0.9  PROT 6.4*  ALBUMIN 3.0*   No results for input(s): LIPASE, AMYLASE in the last 168 hours. No results for input(s): AMMONIA in the last 168 hours. Coagulation Profile: No results  for input(s): INR, PROTIME in the last 168 hours. Cardiac Enzymes: No results for input(s): CKTOTAL, CKMB, CKMBINDEX, TROPONINI in the last 168 hours. BNP (last 3 results) No results for input(s): PROBNP in the last 8760 hours. HbA1C: No results for input(s): HGBA1C in the last 72 hours. CBG: No results for input(s): GLUCAP in the last 168 hours. Lipid Profile: No results for input(s): CHOL, HDL, LDLCALC, TRIG, CHOLHDL, LDLDIRECT in the last 72 hours. Thyroid Function Tests: No results for input(s): TSH, T4TOTAL, FREET4, T3FREE, THYROIDAB in the last 72 hours. Anemia Panel: No results for input(s): VITAMINB12, FOLATE, FERRITIN, TIBC,  IRON, RETICCTPCT in the last 72 hours. Sepsis Labs: Recent Labs  Lab 09/04/21 1034  PROCALCITON <0.10    Recent Results (from the past 240 hour(s))  Resp Panel by RT-PCR (Flu A&B, Covid) Nasopharyngeal Swab     Status: None   Collection Time: 09/03/21  6:23 PM   Specimen: Nasopharyngeal Swab; Nasopharyngeal(NP) swabs in vial transport medium  Result Value Ref Range Status   SARS Coronavirus 2 by RT PCR NEGATIVE NEGATIVE Final    Comment: (NOTE) SARS-CoV-2 target nucleic acids are NOT DETECTED.  The SARS-CoV-2 RNA is generally detectable in upper respiratory specimens during the acute phase of infection. The lowest concentration of SARS-CoV-2 viral copies this assay can detect is 138 copies/mL. A negative result does not preclude SARS-Cov-2 infection and should not be used as the sole basis for treatment or other patient management decisions. A negative result may occur with  improper specimen collection/handling, submission of specimen other than nasopharyngeal swab, presence of viral mutation(s) within the areas targeted by this assay, and inadequate number of viral copies(<138 copies/mL). A negative result must be combined with clinical observations, patient history, and epidemiological information. The expected result is Negative.  Fact Sheet for Patients:  BloggerCourse.com  Fact Sheet for Healthcare Providers:  SeriousBroker.it  This test is no t yet approved or cleared by the [place] FDA and  has been authorized for detection and/or diagnosis of SARS-CoV-2 by FDA under an Emergency Use Authorization (EUA). This EUA will remain  in effect (meaning this test can be used) for the duration of the COVID-19 declaration under Section 564(b)(1) of the Act, 21 U.S.C.section 360bbb-3(b)(1), unless the authorization is terminated  or revoked sooner.       Influenza A by PCR NEGATIVE NEGATIVE Final   Influenza B by PCR  NEGATIVE NEGATIVE Final    Comment: (NOTE) The Xpert Xpress SARS-CoV-2/FLU/RSV plus assay is intended as an aid in the diagnosis of influenza from Nasopharyngeal swab specimens and should not be used as a sole basis for treatment. Nasal washings and aspirates are unacceptable for Xpert Xpress SARS-CoV-2/FLU/RSV testing.  Fact Sheet for Patients: BloggerCourse.com  Fact Sheet for Healthcare Providers: SeriousBroker.it  This test is not yet approved or cleared by the [place] FDA and has been authorized for detection and/or diagnosis of SARS-CoV-2 by FDA under an Emergency Use Authorization (EUA). This EUA will remain in effect (meaning this test can be used) for the duration of the COVID-19 declaration under Section 564(b)(1) of the Act, 21 U.S.C. section 360bbb-3(b)(1), unless the authorization is terminated or revoked.  Performed at [facility], [address] [place], [place] [address]          Radiology Studies: No results found.      Scheduled Meds:  clopidogrel  75 mg Oral Daily   docusate sodium  100 mg Oral BID   feeding supplement (GLUCERNA SHAKE)  237 mL Oral BID BM   heparin  5,000 Units Subcutaneous Q8H   ipratropium-albuterol  3 mL Nebulization TID   lactulose  20 g Oral BID   lidocaine  1 patch Transdermal Q24H   liver oil-zinc oxide   Topical BID   multivitamin with minerals  1 tablet Oral Daily   nicotine  21 mg Transdermal Daily   pantoprazole  40 mg Oral Daily   Ensure Max Protein  11 oz Oral QHS   sodium chloride flush  3 mL Intravenous Q12H   Continuous Infusions:   LOS: 2 days    Time spent: 25 minutes    Barb Merino, MD [facility] Pager [phone]

## 2021-09-06 NOTE — TOC Progression Note (Signed)
Transition of Care [facility]) - Progression Note    Patient Details  Name: SENTA KANTOR MRN: [id number] Date of Birth: February 17, 1956  Transition of Care [facility]) CM/SW Contact  Bing Quarry, RN Phone Number: 09/06/2021, 1:50 PM  Clinical Narrative:    Insurance authorization to return to SNF still pending via [employer] health portal at 150 pm. Gabriel Cirri RN CM     Expected Discharge Plan: Skilled Nursing Facility Barriers to Discharge: Continued Medical Work up  Expected Discharge Plan and Services Expected Discharge Plan: Skilled Nursing Facility   Discharge Planning Services: CM Consult Post Acute Care Choice: Skilled Nursing Facility Living arrangements for the past 2 months: Apartment Expected Discharge Date: 09/05/21               DME Arranged: N/A DME Agency: NA       HH Arranged: NA HH Agency: NA         Social Determinants of Health (SDOH) Interventions    Readmission Risk Interventions Readmission Risk Prevention Plan 09/04/2021 08/18/2021  Transportation Screening Complete Complete  PCP or Specialist Appt within 3-5 Days Complete -  HRI or Home Care Consult Complete Complete  Social Work Consult for Recovery Care Planning/Counseling Complete Complete  Palliative Care Screening Not Applicable Not Applicable  Medication Review [employer]) Complete Complete  Some recent data might be hidden

## 2021-09-07 DIAGNOSIS — R413 Other amnesia: Secondary | ICD-10-CM | POA: Insufficient documentation

## 2021-09-07 DIAGNOSIS — J9601 Acute respiratory failure with hypoxia: Secondary | ICD-10-CM | POA: Diagnosis not present

## 2021-09-07 DIAGNOSIS — J9602 Acute respiratory failure with hypercapnia: Secondary | ICD-10-CM | POA: Diagnosis not present

## 2021-09-07 LAB — URINALYSIS, COMPLETE (UACMP) WITH MICROSCOPIC
Bilirubin Urine: NEGATIVE
Glucose, UA: NEGATIVE mg/dL
Ketones, ur: NEGATIVE mg/dL
Nitrite: NEGATIVE
Protein, ur: NEGATIVE mg/dL
Specific Gravity, Urine: 1.011 (ref 1.005–1.030)
pH: 8 (ref 5.0–8.0)

## 2021-09-07 LAB — RESP PANEL BY RT-PCR (FLU A&B, COVID) ARPGX2
Influenza A by PCR: NEGATIVE
Influenza B by PCR: NEGATIVE
SARS Coronavirus 2 by RT PCR: NEGATIVE

## 2021-09-07 MED ORDER — TRAMADOL HCL 50 MG PO TABS: 50.0000 mg | ORAL_TABLET | Freq: Two times a day (BID) | ORAL | 0 refills | Status: AC | PRN

## 2021-09-07 MED ORDER — TRAMADOL HCL 50 MG PO TABS
50.0000 mg | ORAL_TABLET | Freq: Once | ORAL | Status: AC
Start: 2021-09-07 — End: 2021-09-07
  Administered 2021-09-07: 50 mg via ORAL

## 2021-09-07 MED ORDER — GERHARDT'S BUTT CREAM: 1.0000 "application " | TOPICAL_CREAM | Freq: Two times a day (BID) | CUTANEOUS | Status: DC

## 2021-09-07 MED ORDER — GERHARDT'S BUTT CREAM
1.0000 | TOPICAL_CREAM | Freq: Two times a day (BID) | CUTANEOUS | Status: DC
Start: 2021-09-07 — End: 2021-10-26

## 2021-09-07 MED ORDER — GERHARDT'S BUTT CREAM
TOPICAL_CREAM | Freq: Two times a day (BID) | CUTANEOUS | Status: DC
  Filled 2021-09-07: qty 1

## 2021-09-07 NOTE — Progress Notes (Signed)
°   09/07/21 0037  BiPAP/CPAP/SIPAP  $ Non-Invasive Ventilator  Non-Invasive Vent Subsequent  BiPAP/CPAP/SIPAP Pt Type Adult  Mask Type Full face mask  Mask Size Medium  Set Rate 18 breaths/min  EPAP 10 cmH2O  Oxygen Percent 28 %  Flow Rate 2 lpm  BiPAP/CPAP/SIPAP CPAP  Patient Home Equipment No  Auto Titrate No

## 2021-09-07 NOTE — Progress Notes (Signed)
Physical Therapy Treatment Patient Details Name: Bethany Villa MRN: [id number] DOB: 07/24/1956 Today's Date: 09/07/2021   History of Present Illness Bethany Villa is a 72yoF who comes to [facility] on 09/03/21 in ARF, SpO2 60s% on RA, HR 120s. PMH: GAD, asthma, chronic pain, CHF, COPD, depression, fibromyalgia, GERD, HA, HLD, LEE, home O2 use, OSA. COVID and Flu both (-). Recent admissions in December 2022 for COPD exacerbation , CHF exacerbation, PNA. Pt resides alone at home with support from sons, but DC to [facility] for STR at last admission.    PT Comments    Pt agreeable to limited session, wants to finish her breakfast with minimal interruption, session takes place while dining services is bringing up forgotten meal items. Pt much improved in strength and basic mobility, now AMB to BR with NSG (~34ft to toilet from EOB), able ot march in place with RW, but still loses balance frequently when marching unsupported. Pt fails room air trial with SpO2 dropping to 85% resting at EOB. Pt progressing well in general, but continues to have impaired strength, balance, and O2 saturation compared to baseline. Pt will benefit from skilled PT intervention to address the above deficits in order to restore patient to PLOF and improve safety for return to home.       Recommendations for follow up therapy are one component of a multi-disciplinary discharge planning process, led by the attending physician.  Recommendations may be updated based on patient status, additional functional criteria and insurance authorization.  Follow Up Recommendations  Skilled nursing-short term rehab (<3 hours/day)     Assistance Recommended at Discharge Frequent or constant Supervision/Assistance  Patient can return home with the following Two people to help with walking and/or transfers;A lot of help with walking and/or transfers;Help with stairs or ramp for entrance;Direct supervision/assist for medications management;Direct  supervision/assist for financial management;Assist for transportation;Assistance with cooking/housework;A lot of help with bathing/dressing/bathroom   Equipment Recommendations  None recommended by PT    Recommendations for Other Services       Precautions / Restrictions Precautions Precautions: Fall Restrictions Weight Bearing Restrictions: No     Mobility  Bed Mobility Overal bed mobility: Modified Independent       Supine to sit: Modified independent (Device/Increase time);HOB elevated Sit to supine: Modified independent (Device/Increase time);HOB elevated        Transfers Overall transfer level: Needs assistance Equipment used: Rolling walker (2 wheels) Transfers: Sit to/from Stand Sit to Stand: Supervision           General transfer comment: much improved power this date, easily fatigued after 3-4 reps    Ambulation/Gait Ambulation/Gait assistance:  (deferred. Pt amb to BR with NSG; today wants to finish breakfast)                 Stairs             Wheelchair Mobility    Modified Rankin (Stroke Patients Only)       Balance                                            Cognition Arousal/Alertness: Awake/alert Behavior During Therapy: WFL for tasks assessed/performed Overall Cognitive Status: Within Functional Limits for tasks assessed  Exercises Other Exercises Other Exercises: 5xSTS (sets with recovery inveral seated) Other Exercises: seated EOB, feet unsupported x 10 minutes Other Exercises: Marching in place 12x c RW Other Exercises: Marching in place 12x s UE support (minGuard assist due to frequent LOB and falls anxiety)    General Comments        Pertinent Vitals/Pain Pain Assessment: 0-10 Pain Score: 4  Pain Location: R ribs/flank    Home Living                          Prior Function            PT Goals (current goals can  now be found in the care plan section) Acute Rehab PT Goals Patient Stated Goal: return to STR and continue to improve mobility, strength and safety PT Goal Formulation: With patient Time For Goal Achievement: 09/18/21 Potential to Achieve Goals: Fair Progress towards PT goals: Progressing toward goals    Frequency    Min 2X/week      PT Plan Current plan remains appropriate    Co-evaluation              AM-PAC PT "6 Clicks" Mobility   Outcome Measure  Help needed turning from your back to your side while in a flat bed without using bedrails?: A Little Help needed moving from lying on your back to sitting on the side of a flat bed without using bedrails?: A Little Help needed moving to and from a bed to a chair (including a wheelchair)?: A Little Help needed standing up from a chair using your arms (e.g., wheelchair or bedside chair)?: A Little Help needed to walk in hospital room?: A Lot Help needed climbing 3-5 steps with a railing? : A Lot 6 Click Score: 16    End of Session Equipment Utilized During Treatment: Oxygen Activity Tolerance: Patient tolerated treatment well;Patient limited by pain;No increased pain Patient left: in bed;with call bell/phone within reach;with bed alarm set Nurse Communication: Mobility status;Patient requests pain meds PT Visit Diagnosis: Other abnormalities of gait and mobility (R26.89);Muscle weakness (generalized) (M62.81);Unsteadiness on feet (R26.81)     Time: [id number] PT Time Calculation (min) (ACUTE ONLY): 23 min  Charges:  $Therapeutic Exercise: 23-37 mins                    10:18 AM, 09/07/21 Etta Grandchild, PT, DPT Physical Therapist - [facility]  [phone] ([place])     Bethany Villa C 09/07/2021, 10:15 AM

## 2021-09-07 NOTE — Progress Notes (Addendum)
Patient being discharged to [facility]. Called report to [facility] and spoke to [place]. Patient will be transported via EMS.

## 2021-09-07 NOTE — Progress Notes (Signed)
Patient seen and examined.  No overnight events.  She did wear CPAP until 4 AM in the morning.  Stable to discharge to SNF.  updated discharge summary.  Total time spent today on care of the patient, discharge preparation: 25 minutes.

## 2021-09-07 NOTE — Discharge Summary (Signed)
Physician Discharge Summary  Bethany Villa WTU:[id number] DOB: [birth year] DOA: 09/03/2021  PCP: [name], MD  Admit date: 09/03/2021 Discharge date: 09/07/2021  Admitted From: Skilled nursing facility Disposition: Skilled nursing facility  Recommendations for Outpatient Follow-up:  Follow up with PCP in 1-2 weeks Must wear CPAP at night  Home Health: N/A Equipment/Devices: N/A  Discharge Condition: Stable CODE STATUS: Full code Diet recommendation: Regular diet  Discharge summary: 66 year old female with dementia, COPD, chronic pain syndrome previously on Suboxone, obstructive sleep apnea intermittently on CPAP who was admitted 2 times last month in the hospital and treated with BiPAP and discharged back SNF on 2 L oxygen comes back with respiratory distress. 08/10/2021- 08/13/2021, treated with BiPAP and weaned to oxygen and discharged home.  12/17-12/27, similar presentation.  Treated with BiPAP and discharged to SNF , not wearing CPAP.   Assessment & plan of care:   Acute on chronic hypoxemic and hypercapnic respiratory failure: Suspect multifactorial respiratory failure.  Likely untreated sleep apnea. Untreated sleep apnea. Previous multiple admissions on BiPAP but non compliant to CPAP.  Keep on oxygen to keep saturation more than 90%. Continue supportive therapy with bronchodilator treatment.  Use CPAP consistent at night and oxygen in day time.  Extensively counseled and agreeable.   Chronic systolic congestive heart failure: Currently euvolemic.   Chronic pain syndrome: Tylenol as needed.  Patient is not on any narcotics/taken off Suboxone patches.   Patient will use Tylenol, lidocaine patches.  Will write tramadol for moderate pain.  Try to avoid narcotics.  Cognitive dysfunction: progressive memory loss. Non compliance.  Will need neurology evaluation once all medical issues resolved.  We will send referral to neurology for follow-up.  Skin rashes: Use zinc  oxide cream.  Avoid moisture.   Her readmissions are likely related to not using CPAP at night.  Will ensure that she uses CPAP at night and they have it available at the rehab center before discharging patient. Transfer to short-term rehab when available.    Discharge Diagnoses:  Principal Problem:   Acute respiratory failure with hypoxia and hypercapnia (HCC) Active Problems:   Smoking   COPD (chronic obstructive pulmonary disease) (HCC)   COPD with acute exacerbation (HCC)   Respiratory failure with hypoxia and hypercapnia (HCC)    Discharge Instructions  Discharge Instructions     Diet - low sodium heart healthy   Complete by: As directed    Discharge instructions   Complete by: As directed    Must wear CPAP at night for at least 6-8 hours   Increase activity slowly   Complete by: As directed       Allergies as of 09/07/2021       Reactions   Gabapentin Shortness Of Breath, Rash   Mobic [meloxicam] Anaphylaxis   Penicillins Shortness Of Breath, Swelling   Tolerated Zosyn December 2022 Has patient had a PCN reaction causing immediate rash, facial/tongue/throat swelling, SOB or lightheadedness with hypotension: unknown Has patient had a PCN reaction causing severe rash involving mucus membranes or skin necrosis: {unknown Has patient had a PCN reaction that required hospitalization {unknown Has patient had a PCN reaction occurring within the last 10 years: no If all of the above answers are "NO", then may proceed with Cephalosporin use.   Sulfa Antibiotics Shortness Of Breath, Swelling        Medication List     STOP taking these medications    oxyCODONE 5 MG immediate release tablet Commonly known as: Oxy IR/ROXICODONE  TAKE these medications    albuterol (2.5 MG/3ML) 0.083% nebulizer solution Commonly known as: PROVENTIL Take 3 mLs (2.5 mg total) by nebulization every 2 (two) hours as needed for shortness of breath or wheezing.   clopidogrel 75  MG tablet Commonly known as: Plavix Take 1 tablet (75 mg total) by mouth daily.   docusate sodium 100 MG capsule Commonly known as: COLACE Take 1 capsule (100 mg total) by mouth 2 (two) times daily as needed for mild constipation.   Gerhardt's butt cream Crea Apply 1 application topically 2 (two) times daily.   Gerhardt's butt cream Crea Apply 1 application topically 2 (two) times daily. Apply to redness / moisture area   guaiFENesin-dextromethorphan 100-10 MG/5ML syrup Commonly known as: ROBITUSSIN DM Take 5 mLs by mouth every 4 (four) hours as needed for cough.   lactulose 10 GM/15ML solution Commonly known as: CHRONULAC Take 30 mLs (20 g total) by mouth 2 (two) times daily.   lidocaine 5 % Commonly known as: LIDODERM Place 1 patch onto the skin daily. Remove & Discard patch within 12 hours or as directed by MD   nicotine 21 mg/24hr patch Commonly known as: NICODERM CQ - dosed in mg/24 hours Place 1 patch (21 mg total) onto the skin daily.   pantoprazole 40 MG tablet Commonly known as: PROTONIX Take 1 tablet (40 mg total) by mouth daily.   traMADol 50 MG tablet Commonly known as: ULTRAM Take 1 tablet (50 mg total) by mouth every 12 (twelve) hours as needed for up to 5 days for moderate pain.        Follow-up Information     [name], MD Follow up in 2 week(s).   Specialty: Physician Assistant Contact information: [phone] [facility] [place] [address] [phone]                Allergies  Allergen Reactions   Gabapentin Shortness Of Breath and Rash   Mobic [Meloxicam] Anaphylaxis   Penicillins Shortness Of Breath and Swelling    Tolerated Zosyn December 2022  Has patient had a PCN reaction causing immediate rash, facial/tongue/throat swelling, SOB or lightheadedness with hypotension: unknown Has patient had a PCN reaction causing severe rash involving mucus membranes or skin necrosis: {unknown Has patient had  a PCN reaction that required hospitalization {unknown Has patient had a PCN reaction occurring within the last 10 years: no If all of the above answers are "NO", then may proceed with Cephalosporin use.   Sulfa Antibiotics Shortness Of Breath and Swelling    Consultations: None   Procedures/Studies: DG Ribs Unilateral Right  Result Date: 08/12/2021 CLINICAL DATA:  Right-greater-than-left rib pain. EXAM: LEFT RIBS AND CHEST - 3+ VIEW; RIGHT RIBS - 2 VIEW COMPARISON:  Chest x-ray dated August 10, 2021. FINDINGS: Acute nondisplaced fracture of the right lateral sixth rib. No left-sided rib fracture. Stable cardiomediastinal silhouette. Patchy interstitial opacities in the left greater than right upper lobes have improved. No pleural effusion or pneumothorax. IMPRESSION: 1. Acute nondisplaced fracture of the right lateral sixth rib. 2. Improved bilateral upper lobe pneumonia. Electronically Signed   By: Obie Dredge M.D.   On: 08/12/2021 12:33   DG Ribs Unilateral W/Chest Left  Result Date: 08/12/2021 CLINICAL DATA:  Right-greater-than-left rib pain. EXAM: LEFT RIBS AND CHEST - 3+ VIEW; RIGHT RIBS - 2 VIEW COMPARISON:  Chest x-ray dated August 10, 2021. FINDINGS: Acute nondisplaced fracture of the right lateral sixth rib. No left-sided rib fracture. Stable cardiomediastinal  silhouette. Patchy interstitial opacities in the left greater than right upper lobes have improved. No pleural effusion or pneumothorax. IMPRESSION: 1. Acute nondisplaced fracture of the right lateral sixth rib. 2. Improved bilateral upper lobe pneumonia. Electronically Signed   By: Obie Dredge M.D.   On: 08/12/2021 12:33   CT HEAD WO CONTRAST ( )  Result Date: 08/10/2021 CLINICAL DATA:  Mental status change. EXAM: CT HEAD WITHOUT CONTRAST TECHNIQUE: Contiguous axial images were obtained from the base of the skull through the vertex without intravenous contrast. COMPARISON:  Head CT dated October 07, 2019  FINDINGS: Brain: No evidence of acute infarction, hemorrhage, hydrocephalus, extra-axial collection or mass lesion/mass effect. Vascular: No hyperdense vessel or unexpected calcification. Skull: Normal. Negative for fracture or focal lesion. Sinuses/Orbits: No acute finding. Other: None. IMPRESSION: No acute intracranial abnormality. Electronically Signed   By: Allegra Lai M.D.   On: 08/10/2021 21:11   CT Angio Chest Pulmonary Embolism (PE) W or WO Contrast  Result Date: 09/04/2021 CLINICAL DATA:  Respiratory failure, respiratory distress with oxygen saturation in the 60s on room air EXAM: CT ANGIOGRAPHY CHEST WITH CONTRAST TECHNIQUE: Multidetector CT imaging of the chest was performed using the standard protocol during bolus administration of intravenous contrast. Multiplanar CT image reconstructions and MIPs were obtained to evaluate the vascular anatomy. CONTRAST:  80mL OMNIPAQUE IOHEXOL 350 MG/ML SOLN IV COMPARISON:  CT chest 09/04/2015 FINDINGS: Cardiovascular: Atherosclerotic calcifications aorta and coronary arteries. Aorta normal caliber without aneurysm or dissection. Heart unremarkable. No pericardial effusion. Pulmonary arteries adequately opacified and patent. No evidence of pulmonary embolism. Mediastinum/Nodes: Base of cervical region normal appearance. No thoracic adenopathy. Esophagus unremarkable. Lungs/Pleura: Emphysematous changes. Central peribronchial thickening. Scattered interstitial prominence. No acute infiltrate, pleural effusion, or pneumothorax. Upper Abdomen: Nodular appearing liver suggestive of cirrhosis. Spleen appears enlarged, 14.8 cm in greatest axial dimension, craniocaudal length not assessed. Small hypervascular focus lateral spleen 9 mm diameter, nonspecific but unchanged. Small splenule adjacent to spleen. Musculoskeletal: No acute osseous findings. Multiple healing LEFT rib fractures. Review of the MIP images confirms the above findings. IMPRESSION: No evidence of  pulmonary embolism. Nodular hepatic contours question cirrhosis. Probable splenomegaly. Aortic Atherosclerosis (ICD10-I70.0) and Emphysema (ICD10-J43.9). Electronically Signed   By: Ulyses Southward M.D.   On: 09/04/2021 10:07   NM Hepatobiliary Liver Func  Result Date: 08/12/2021 CLINICAL DATA:  Right upper quadrant pain, cholelithiasis EXAM: NUCLEAR MEDICINE HEPATOBILIARY IMAGING TECHNIQUE: Sequential images of the abdomen were obtained out to 60 minutes following intravenous administration of radiopharmaceutical. RADIOPHARMACEUTICALS:  5.29 mCi Tc-73m  Choletec IV COMPARISON:  08/11/2021 FINDINGS: Prompt uptake and biliary excretion of activity by the liver is seen. Gallbladder activity is visualized, consistent with patency of cystic duct. Biliary activity passes into small bowel, consistent with patent common bile duct. IMPRESSION: 1. Normal hepatobiliary scan.  No evidence of acute cholecystitis. Electronically Signed   By: Sharlet Salina M.D.   On: 08/12/2021 15:59   DG Chest Portable 1 View  Result Date: 09/03/2021 CLINICAL DATA:  COPD, on BiPAP. EXAM: PORTABLE CHEST 1 VIEW COMPARISON:  Radiograph 08/21/2021 FINDINGS: Normal heart size and mediastinal contours. Chronic peribronchial thickening. Mild streaky scarring in the suprahilar right lung. No confluent consolidation, pleural effusion, or pneumothorax. Stable osseous structures. IMPRESSION: Chronic peribronchial thickening. Electronically Signed   By: Narda Rutherford M.D.   On: 09/03/2021 18:17   DG Chest Port 1 View  Result Date: 08/21/2021 CLINICAL DATA:  Shortness of breath EXAM: PORTABLE CHEST 1 VIEW COMPARISON:  08/15/2021 FINDINGS: The heart size and  mediastinal contours are within normal limits. Chronic interstitial changes. Pulmonary vascular congestion. Left upper lung cavitary structure on the prior study is not identified. No pleural effusion or pneumothorax. Normal heart size. IMPRESSION: COPD.  Pulmonary vascular congestion.  No  focal consolidation. Electronically Signed   By: Guadlupe Spanish M.D.   On: 08/21/2021 08:43   DG Chest Portable 1 View  Result Date: 08/15/2021 CLINICAL DATA:  Respiratory distress. EXAM: PORTABLE CHEST 1 VIEW COMPARISON:  08/12/2021 and older exams. FINDINGS: Cardiac silhouette is normal in size. No mediastinal or hilar masses. Lungs demonstrate vascular interstitial prominence. There are no areas of lung consolidation. No convincing pleural effusion and no pneumothorax. Skeletal structures are grossly intact. IMPRESSION: 1. No convincing acute cardiopulmonary disease. Electronically Signed   By: Amie Portland M.D.   On: 08/15/2021 16:06   DG Chest Portable 1 View  Result Date: 08/10/2021 CLINICAL DATA:  Shortness of breath EXAM: PORTABLE CHEST 1 VIEW COMPARISON:  08/10/2021 FINDINGS: NG tube is in the stomach. Heart is normal size. Patchy opacities in the upper lobes, left greater than right. No effusions. No acute bony abnormality. IMPRESSION: Patchy opacities in the upper lobes, left greater than right concerning for pneumonia. Electronically Signed   By: Charlett Nose M.D.   On: 08/10/2021 22:26   DG Chest Portable 1 View  Result Date: 08/10/2021 CLINICAL DATA:  Shortness of breath EXAM: PORTABLE CHEST 1 VIEW COMPARISON:  Chest x-ray dated June 27, 2020 FINDINGS: Cardiac and mediastinal contours are unchanged. Upper lung predominant heterogeneous opacities. No evidence of pleural effusion or pneumothorax. IMPRESSION: Upper lung predominant heterogeneous opacities, concerning for infection. Electronically Signed   By: Allegra Lai M.D.   On: 08/10/2021 19:01   DG Abd Portable 1 View  Result Date: 08/10/2021 CLINICAL DATA:  Shortness of breath EXAM: PORTABLE ABDOMEN - 1 VIEW COMPARISON:  Chest x-ray 06/10/2020, CT 08/27/2011 FINDINGS: Esophageal tube looped over the epigastric area with tip projecting over distal stomach, side-port at the level of GE junction. There may be a small hiatal  hernia. IMPRESSION: Esophageal tube is looped upon itself over the epigastric area with the tip projecting over the distal stomach Electronically Signed   By: Jasmine Pang M.D.   On: 08/10/2021 22:28   ECHOCARDIOGRAM COMPLETE  Result Date: 08/13/2021    ECHOCARDIOGRAM REPORT   Patient Name:   Bethany EGELHOFF Date of Exam: 08/13/2021 Medical Rec #:  [id number]      Height:       64.0 in Accession #:    [id number]     Weight:       180.0 lb Date of Birth:  06-09-1956      BSA:          1.871 m Patient Age:    65 years       BP:           152/98 mmHg Patient Gender: F              HR:           73 bpm. Exam Location:  [facility] Procedure: 2D Echo, Color Doppler, Cardiac Doppler and Intracardiac            Opacification Agent Indications:     R94.31 Abnormal ECG  History:         Patient has prior history of Echocardiogram examinations. COPD;                  Risk Factors:Dyslipidemia.  Sonographer:  Humphrey Rolls Referring Phys:  [id number] [facility] AMIN Diagnosing Phys: Arnoldo Hooker MD  Sonographer Comments: Technically difficult study due to poor echo windows. Image acquisition challenging due to patient body habitus and Image acquisition challenging due to COPD. IMPRESSIONS  1. Left ventricular ejection fraction, by estimation, is 60 to 65%. The left ventricle has normal function. The left ventricle has no regional wall motion abnormalities. Left ventricular diastolic parameters were normal.  2. Right ventricular systolic function is normal. The right ventricular size is normal.  3. The mitral valve is normal in structure. Trivial mitral valve regurgitation.  4. The aortic valve is normal in structure. Aortic valve regurgitation is not visualized. FINDINGS  Left Ventricle: Left ventricular ejection fraction, by estimation, is 60 to 65%. The left ventricle has normal function. The left ventricle has no regional wall motion abnormalities. Definity contrast agent was given IV to delineate the left ventricular  endocardial  borders. The left ventricular internal cavity size was normal in size. There is no left ventricular hypertrophy. Left ventricular diastolic parameters were normal. Right Ventricle: The right ventricular size is normal. No increase in right ventricular wall thickness. Right ventricular systolic function is normal. Left Atrium: Left atrial size was normal in size. Right Atrium: Right atrial size was normal in size. Pericardium: There is no evidence of pericardial effusion. Mitral Valve: The mitral valve is normal in structure. Trivial mitral valve regurgitation. MV peak gradient, 6.7 mmHg. The mean mitral valve gradient is 3.0 mmHg. Tricuspid Valve: The tricuspid valve is normal in structure. Tricuspid valve regurgitation is trivial. Aortic Valve: The aortic valve is normal in structure. Aortic valve regurgitation is not visualized. Aortic valve mean gradient measures 5.0 mmHg. Aortic valve peak gradient measures 13.1 mmHg. Aortic valve area, by VTI measures 2.17 cm. Pulmonic Valve: The pulmonic valve was normal in structure. Pulmonic valve regurgitation is not visualized. Aorta: The aortic root and ascending aorta are structurally normal, with no evidence of dilitation. IAS/Shunts: No atrial level shunt detected by color flow Doppler.  LEFT VENTRICLE PLAX 2D LVIDd:         3.10 cm   Diastology LVIDs:         2.23 cm   LV e' medial:    6.74 cm/s LV PW:         1.08 cm   LV E/e' medial:  14.3 LV IVS:        0.92 cm   LV e' lateral:   7.83 cm/s LVOT diam:     2.10 cm   LV E/e' lateral: 12.3 LV SV:         70 LV SV Index:   37 LVOT Area:     3.46 cm  LEFT ATRIUM         Index LA diam:    3.20 cm 1.71 cm/m  AORTIC VALVE                     PULMONIC VALVE AV Area (Vmax):    1.99 cm      PV Vmax:       1.11 m/s AV Area (Vmean):   2.18 cm      PV Vmean:      69.800 cm/s AV Area (VTI):     2.17 cm      PV VTI:        0.194 m AV Vmax:           181.00 cm/s   PV Peak grad:  4.9 mmHg  AV Vmean:          107.000 cm/s  PV  Mean grad:  2.0 mmHg AV VTI:            0.323 m AV Peak Grad:      13.1 mmHg AV Mean Grad:      5.0 mmHg LVOT Vmax:         104.00 cm/s LVOT Vmean:        67.500 cm/s LVOT VTI:          0.202 m LVOT/AV VTI ratio: 0.63  AORTA Ao Root diam: 2.80 cm MITRAL VALVE MV Area (PHT): 4.33 cm     SHUNTS MV Area VTI:   2.01 cm     Systemic VTI:  0.20 m MV Peak grad:  6.7 mmHg     Systemic Diam: 2.10 cm MV Mean grad:  3.0 mmHg MV Vmax:       1.29 m/s MV Vmean:      79.0 cm/s MV Decel Time: 175 msec MV E velocity: 96.40 cm/s MV A velocity: 117.00 cm/s MV E/A ratio:  0.82 Arnoldo HookerBruce Kowalski MD Electronically signed by Arnoldo HookerBruce Kowalski MD Signature Date/Time: 08/13/2021/12:41:56 PM    Final    US Abdomen Limited RUQ (LIVER/GB)  Result Date: 08/11/2021 CLINICAL DATA:  Increased ammonia levels EXAM: ULTRASOUND ABDOMEN LIMITED RIGHT UPPER QUADRANT COMPARISON:  No prior abdominal ultrasound, FINDINGS: Gallbladder: Multiple gallstones, measuring up to 1.8 cm. No wall thickening or pericholecystic fluid. Negative Murphy sign. Common bile duct: Diameter: 4 mm Liver: Mild nodularity of the liver borders. No focal lesion identified. The liver is normal in echogenicity. Portal vein is patent on color Doppler imaging with normal direction of blood flow towards the liver. Other: Evaluation is somewhat limited as the patient was not able to be put in the decubitus position. IMPRESSION: 1. Cholelithiasis without evidence of cholecystitis. 2. Mild hepatic nodularity, as can be seen with cirrhosis. Normal liver echogenicity. No focal lesion. Electronically Signed   By: Wiliam KeAlison  Vasan M.D.   On: 08/11/2021 03:40   (Echo, Carotid, EGD, Colonoscopy, ERCP)    Subjective:  Patient seen and examined today . No new events. She wore CPAP overnight until about 4 AM.  She is agreeable to go to rehab and continue to use CPAP.  Discharge Exam: Vitals:   09/07/21 0815 09/07/21 0845  BP:  110/64  Pulse:  82  Resp:  20  Temp:  97.8 F (36.6 C)   SpO2: 98% 98%   Vitals:   09/06/21 2116 09/07/21 0638 09/07/21 0815 09/07/21 0845  BP:  127/81  110/64  Pulse:  87  82  Resp:  18  20  Temp:  98 F (36.7 C)  97.8 F (36.6 C)  TempSrc:  Oral  Oral  SpO2: 98% 99% 98% 98%  Weight:      Height:        General: Pt is alert, awake, not in acute distress Comfortable on 2 L oxygen.  Eating breakfast. Cardiovascular: RRR, S1/S2 +, no rubs, no gallops Respiratory: CTA bilaterally, no wheezing, no rhonchi Abdominal: Soft, NT, ND, bowel sounds + Extremities: no edema, no cyanosis Patient has erythematous rashes on right flank and right side of the buttock which is probably due to moisture.  No open wounds.    The results of significant diagnostics from this hospitalization (including imaging, microbiology, ancillary and laboratory) are listed below for reference.     Microbiology: Recent Results (from the past 240 hour(s))  Resp  Panel by RT-PCR (Flu A&B, Covid) Nasopharyngeal Swab     Status: None   Collection Time: 09/03/21  6:23 PM   Specimen: Nasopharyngeal Swab; Nasopharyngeal(NP) swabs in vial transport medium  Result Value Ref Range Status   SARS Coronavirus 2 by RT PCR NEGATIVE NEGATIVE Final    Comment: (NOTE) SARS-CoV-2 target nucleic acids are NOT DETECTED.  The SARS-CoV-2 RNA is generally detectable in upper respiratory specimens during the acute phase of infection. The lowest concentration of SARS-CoV-2 viral copies this assay can detect is 138 copies/mL. A negative result does not preclude SARS-Cov-2 infection and should not be used as the sole basis for treatment or other patient management decisions. A negative result may occur with  improper specimen collection/handling, submission of specimen other than nasopharyngeal swab, presence of viral mutation(s) within the areas targeted by this assay, and inadequate number of viral copies(<138 copies/mL). A negative result must be combined with clinical observations,  patient history, and epidemiological information. The expected result is Negative.  Fact Sheet for Patients:  BloggerCourse.comhttps://www.fda.gov/media/152166/download  Fact Sheet for Healthcare Providers:  SeriousBroker.ithttps://www.fda.gov/media/152162/download  This test is no t yet approved or cleared by the [place] FDA and  has been authorized for detection and/or diagnosis of SARS-CoV-2 by FDA under an Emergency Use Authorization (EUA). This EUA will remain  in effect (meaning this test can be used) for the duration of the COVID-19 declaration under Section 564(b)(1) of the Act, 21 U.S.C.section 360bbb-3(b)(1), unless the authorization is terminated  or revoked sooner.       Influenza A by PCR NEGATIVE NEGATIVE Final   Influenza B by PCR NEGATIVE NEGATIVE Final    Comment: (NOTE) The Xpert Xpress SARS-CoV-2/FLU/RSV plus assay is intended as an aid in the diagnosis of influenza from Nasopharyngeal swab specimens and should not be used as a sole basis for treatment. Nasal washings and aspirates are unacceptable for Xpert Xpress SARS-CoV-2/FLU/RSV testing.  Fact Sheet for Patients: BloggerCourse.comhttps://www.fda.gov/media/152166/download  Fact Sheet for Healthcare Providers: SeriousBroker.ithttps://www.fda.gov/media/152162/download  This test is not yet approved or cleared by the [place] FDA and has been authorized for detection and/or diagnosis of SARS-CoV-2 by FDA under an Emergency Use Authorization (EUA). This EUA will remain in effect (meaning this test can be used) for the duration of the COVID-19 declaration under Section 564(b)(1) of the Act, 21 U.S.C. section 360bbb-3(b)(1), unless the authorization is terminated or revoked.  Performed at [facility], [address] [place], [place] [id number]      Labs: BNP (last 3 results) Recent Labs    08/13/21 0327 08/15/21 1547 09/03/21 1743  BNP 325.2* 230.5* 217.5*   Basic Metabolic Panel: Recent Labs  Lab 09/03/21 1743 09/04/21 1034  NA  134* 135  K 4.0 3.9  CL 91* 90*  CO2 34* 37*  GLUCOSE 160* 192*  BUN 17 16  CREATININE 0.55 0.50  CALCIUM 8.8* 8.6*  MG 2.4  --    Liver Function Tests: Recent Labs  Lab 09/03/21 1743  AST 21  ALT 15  ALKPHOS 102  BILITOT 0.9  PROT 6.4*  ALBUMIN 3.0*   No results for input(s): LIPASE, AMYLASE in the last 168 hours. No results for input(s): AMMONIA in the last 168 hours. CBC: Recent Labs  Lab 09/03/21 1743 09/04/21 1034  WBC 5.8 3.9*  NEUTROABS 4.2  --   HGB 11.7* 11.3*  HCT 38.0 36.3  MCV 95.2 92.1  PLT 185 165   Cardiac Enzymes: No results for input(s): CKTOTAL, CKMB, CKMBINDEX, TROPONINI in  the last 168 hours. BNP: Invalid input(s): POCBNP CBG: No results for input(s): GLUCAP in the last 168 hours. D-Dimer No results for input(s): DDIMER in the last 72 hours.  Hgb A1c No results for input(s): HGBA1C in the last 72 hours. Lipid Profile No results for input(s): CHOL, HDL, LDLCALC, TRIG, CHOLHDL, LDLDIRECT in the last 72 hours. Thyroid function studies No results for input(s): TSH, T4TOTAL, T3FREE, THYROIDAB in the last 72 hours.  Invalid input(s): FREET3 Anemia work up No results for input(s): VITAMINB12, FOLATE, FERRITIN, TIBC, IRON, RETICCTPCT in the last 72 hours. Urinalysis    Component Value Date/Time   COLORURINE YELLOW (A) 08/10/2021 1832   APPEARANCEUR CLEAR (A) 08/10/2021 1832   APPEARANCEUR Hazy 08/22/2013 1111   LABSPEC 1.008 08/10/2021 1832   LABSPEC 1.016 08/22/2013 1111   PHURINE 8.0 08/10/2021 1832   GLUCOSEU NEGATIVE 08/10/2021 1832   GLUCOSEU Negative 08/22/2013 1111   HGBUR NEGATIVE 08/10/2021 1832   BILIRUBINUR NEGATIVE 08/10/2021 1832   BILIRUBINUR Negative 08/22/2013 1111   KETONESUR NEGATIVE 08/10/2021 1832   PROTEINUR NEGATIVE 08/10/2021 1832   UROBILINOGEN 0.2 07/24/2014 0635   NITRITE NEGATIVE 08/10/2021 1832   LEUKOCYTESUR NEGATIVE 08/10/2021 1832   LEUKOCYTESUR 1+ 08/22/2013 1111   Sepsis Labs Invalid input(s):  PROCALCITONIN,  WBC,  LACTICIDVEN Microbiology Recent Results (from the past 240 hour(s))  Resp Panel by RT-PCR (Flu A&B, Covid) Nasopharyngeal Swab     Status: None   Collection Time: 09/03/21  6:23 PM   Specimen: Nasopharyngeal Swab; Nasopharyngeal(NP) swabs in vial transport medium  Result Value Ref Range Status   SARS Coronavirus 2 by RT PCR NEGATIVE NEGATIVE Final    Comment: (NOTE) SARS-CoV-2 target nucleic acids are NOT DETECTED.  The SARS-CoV-2 RNA is generally detectable in upper respiratory specimens during the acute phase of infection. The lowest concentration of SARS-CoV-2 viral copies this assay can detect is 138 copies/mL. A negative result does not preclude SARS-Cov-2 infection and should not be used as the sole basis for treatment or other patient management decisions. A negative result may occur with  improper specimen collection/handling, submission of specimen other than nasopharyngeal swab, presence of viral mutation(s) within the areas targeted by this assay, and inadequate number of viral copies(<138 copies/mL). A negative result must be combined with clinical observations, patient history, and epidemiological information. The expected result is Negative.  Fact Sheet for Patients:  BloggerCourse.com  Fact Sheet for Healthcare Providers:  SeriousBroker.it  This test is no t yet approved or cleared by the [place] FDA and  has been authorized for detection and/or diagnosis of SARS-CoV-2 by FDA under an Emergency Use Authorization (EUA). This EUA will remain  in effect (meaning this test can be used) for the duration of the COVID-19 declaration under Section 564(b)(1) of the Act, 21 U.S.C.section 360bbb-3(b)(1), unless the authorization is terminated  or revoked sooner.       Influenza A by PCR NEGATIVE NEGATIVE Final   Influenza B by PCR NEGATIVE NEGATIVE Final    Comment: (NOTE) The Xpert Xpress  SARS-CoV-2/FLU/RSV plus assay is intended as an aid in the diagnosis of influenza from Nasopharyngeal swab specimens and should not be used as a sole basis for treatment. Nasal washings and aspirates are unacceptable for Xpert Xpress SARS-CoV-2/FLU/RSV testing.  Fact Sheet for Patients: BloggerCourse.com  Fact Sheet for Healthcare Providers: SeriousBroker.it  This test is not yet approved or cleared by the [place] FDA and has been authorized for detection and/or diagnosis of SARS-CoV-2 by FDA under an  Emergency Use Authorization (EUA). This EUA will remain in effect (meaning this test can be used) for the duration of the COVID-19 declaration under Section 564(b)(1) of the Act, 21 U.S.C. section 360bbb-3(b)(1), unless the authorization is terminated or revoked.  Performed at [facility], [address], [place], [place] [address]      Time coordinating discharge:  40 minutes  SIGNED:   Dorcas Carrow, MD  [facility] 09/07/2021, 10:54 AM

## 2021-09-07 NOTE — TOC Progression Note (Addendum)
Transition of Care [facility]) - Progression Note    Patient Details  Name: Bethany Villa MRN: [id number] Date of Birth: May 08, 1956  [facility] CM/SW Contact  Margarito Liner, LCSW Phone Number: 09/07/2021, 8:40 AM  Clinical Narrative:  Insurance authorization approved. Auth number has not generated yet. Reference #[id number]. Valid 1/9-1/11. Left message for SNF admissions coordinator to notify. Will check on CPAP when she responds.   10:21 am: Patient was not on CPAP prior to admission. Faxed settings to SNF admissions coordinator so she can order and said they should have it today. Auth number obtained: [id number].  1:51 pm: COVID results pending. CPAP will be delivered to the SNF after 3:00. Spoke to son Molli Hazard earlier. He was asking about a Medicaid application so patient can get CAP services once she returns home. Encouraged him to discuss with SNF social worker.  Expected Discharge Plan: Skilled Nursing Facility Barriers to Discharge: Continued Medical Work up  Expected Discharge Plan and Services Expected Discharge Plan: Skilled Nursing Facility   Discharge Planning Services: CM Consult Post Acute Care Choice: Skilled Nursing Facility Living arrangements for the past 2 months: Apartment Expected Discharge Date: 09/05/21               DME Arranged: N/A DME Agency: NA       HH Arranged: NA HH Agency: NA         Social Determinants of Health (SDOH) Interventions    Readmission Risk Interventions Readmission Risk Prevention Plan 09/04/2021 08/18/2021  Transportation Screening Complete Complete  PCP or Specialist Appt within 3-5 Days Complete -  HRI or Home Care Consult Complete Complete  Social Work Consult for Recovery Care Planning/Counseling Complete Complete  Palliative Care Screening Not Applicable Not Applicable  Medication Review Oceanographer) Complete Complete  Some recent data might be hidden

## 2021-09-07 NOTE — Care Management Important Message (Signed)
Important Message  Patient Details  Name: KYESHIA DECATUR MRN: [id number] Date of Birth: 1955/12/29   Medicare Important Message Given:  Yes     Juliann Pulse A Eldin Bonsell 09/07/2021, 11:14 AM

## 2021-09-07 NOTE — Consult Note (Signed)
WOC Nurse wound consult note Consultation was completed by review of records, images and assistance from the bedside nurse/clinical staff.  Reason for Consult: MASD (moisture associated skin damage) Wound type: MASD Pressure Injury POA: Yes Wound bed: described in notes as intense redness over the buttocks and posterior thighs  Drainage (amount, consistency, odor) NA Periwound: intact  Dressing procedure/placement/frequency:  DC current barrier cream Add Gerhardt's butt paste Re consult if needed, will not follow at this time. Thanks  Lenore Moyano [employer], RN,CWOCN, CNS, CWON-AP [phone])

## 2021-09-07 NOTE — Progress Notes (Signed)
Occupational Therapy Treatment Patient Details Name: Bethany Villa MRN: [id number] DOB: 1955/09/28 Today's Date: 09/07/2021   History of present illness Bethany Villa is a 65yoF who comes [facility] on 09/03/21 in ARF, SpO2 60s% on RA, HR 120s. PMH: GAD, asthma, chronic pain, CHF, COPD, depression, fibromyalgia, GERD, HA, HLD, LEE, home O2 use, OSA. COVID and Flu both (-). Recent admissions in December 2022 for COPD exacerbation , CHF exacerbation, PNA. Pt resides alone at home with support from sons, but DC to [facility] for STR at last admission.   OT comments  Bethany Villa continues to present with limited balance, strength, and endurance that impacts her ability to safely and independently complete self-care tasks.  Pt was polite and agreeable to OT session, though was unwilling to participate in OOB mobility (pt reports fatigue from earlier PT session).   OT provided setup assistance for grooming tasks at bedlevel (oral care, washing face).  OT provided supervision assist for bed mobility (rolling) to readjust lines and leads in bed.  Pt with shortness of breath after extended bed mobility, SpO2 at 95% on 2L O2 when checked by OT.  OT provided education re: importance of wearing CPAP at night after discharge to promote health and wellbeing, as well as prevent readmission and promote independence in ADLs.  Pt verbalized understanding.  OT also provided verbal cueing for sequencing and problem solving to support pt calling her son (pt presenting as self-limiting and benefited from additional cueing to promote independence in IADL.  Bethany Villa will continue to benefit from skilled OT services in acute setting to support strength, endurance, and safety and independence in ADLs.  Recommend STR upon discharge to further address these goals.   Recommendations for follow up therapy are one component of a multi-disciplinary discharge planning process, led by the attending physician.  Recommendations may be  updated based on patient status, additional functional criteria and insurance authorization.    Follow Up Recommendations  Skilled nursing-short term rehab (<3 hours/day)    Assistance Recommended at Discharge Intermittent Supervision/Assistance  Patient can return home with the following      Equipment Recommendations       Recommendations for Other Services      Precautions / Restrictions Precautions Precautions: Fall Restrictions Weight Bearing Restrictions: No       Mobility Bed Mobility Overal bed mobility: Modified Independent Bed Mobility: Rolling Rolling: Supervision   Supine to sit: Modified independent (Device/Increase time);HOB elevated Sit to supine: Modified independent (Device/Increase time);HOB elevated   General bed mobility comments: Mod I with rolling side to side, did not assess further bed mobility Patient Response: Cooperative  Transfers Overall transfer level: Needs assistance Equipment used: Rolling walker (2 wheels) Transfers: Sit to/from Stand Sit to Stand: Supervision           General transfer comment: much improved power this date, easily fatigued after 3-4 reps     Balance Overall balance assessment: Needs assistance                                         ADL either performed or assessed with clinical judgement   ADL Overall ADL's : Needs assistance/impaired Eating/Feeding: Set up;Sitting   Grooming: Wash/dry hands;Wash/dry face;Oral care;Bed level  General ADL Comments: Provided setup assist for oral care and washing face at bedlevel (pt declined further OOB mobility)    Extremity/Trunk Assessment Upper Extremity Assessment Upper Extremity Assessment: Generalized weakness   Lower Extremity Assessment Lower Extremity Assessment: Generalized weakness        Vision Baseline Vision/History: 1 Wears glasses Ability to See in Adequate Light: 0  Adequate Patient Visual Report: No change from baseline     Perception     Praxis      Cognition Arousal/Alertness: Awake/alert Behavior During Therapy: WFL for tasks assessed/performed Overall Cognitive Status: Within Functional Limits for tasks assessed                                 General Comments: oriented x4, no gross memory concerns noted          Exercises Other Exercises Other Exercises: 5xSTS (sets with recovery inveral seated) Other Exercises: seated EOB, feet unsupported x 10 minutes Other Exercises: Marching in place 12x c RW Other Exercises: Marching in place 12x s UE support (minGuard assist due to frequent LOB and falls anxiety) Other Exercises: provided education re: importance of CPAP at night, monitoring home O2, and benefits of OOB mobility. Provided supervision for bed mobility and setup assist for grooming tasks   Shoulder Instructions       General Comments SpO2 at 95% on 2L [place] after pt c/o SOB with bed mobility    Pertinent Vitals/ Pain       Pain Assessment: Faces Pain Score: 4  Faces Pain Scale: Hurts a little bit Breathing: normal Pain Location: R ribs/flank Pain Descriptors / Indicators: Grimacing Pain Intervention(s): Patient requesting pain meds-RN notified;Limited activity within patient's tolerance;Monitored during session  Home Living                                          Prior Functioning/Environment              Frequency  Min 2X/week        Progress Toward Goals  OT Goals(current goals can now be found in the care plan section)  Progress towards OT goals: OT to reassess next treatment (self-limiting)  Acute Rehab OT Goals Patient Stated Goal: to live independently OT Goal Formulation: With patient Time For Goal Achievement: 09/18/21 Potential to Achieve Goals: Good  Plan Discharge plan remains appropriate;Frequency remains appropriate    Co-evaluation                  AM-PAC OT "6 Clicks" Daily Activity     Outcome Measure   Help from another person eating meals?: None Help from another person taking care of personal grooming?: A Little Help from another person toileting, which includes using toliet, bedpan, or urinal?: A Lot Help from another person bathing (including washing, rinsing, drying)?: A Lot Help from another person to put on and taking off regular upper body clothing?: A Little Help from another person to put on and taking off regular lower body clothing?: A Lot 6 Click Score: 16    End of Session Equipment Utilized During Treatment: Oxygen  OT Visit Diagnosis: Other abnormalities of gait and mobility (R26.89);Muscle weakness (generalized) (M62.81)   Activity Tolerance Patient limited by fatigue   Patient Left in bed;with call bell/phone within reach;with bed alarm set   Nurse Communication  Patient requests pain meds        Time: 1138-1205 OT Time Calculation (min): 27 min  Charges: OT General Charges $OT Visit: 1 Visit OT Treatments $Self Care/Home Management : 23-37 mins  Dennison Nancy, OTR/L 09/07/21, 12:26 PM

## 2021-09-07 NOTE — TOC Transition Note (Signed)
Transition of Care [facility]) - CM/SW Discharge Note   Patient Details  Name: Bethany Villa MRN: [id number] Date of Birth: [birth year]  Transition of Care [facility]) CM/SW Contact:  Candie Chroman, LCSW Phone Number: 09/07/2021, 3:11 PM   Clinical Narrative:   Patient has orders to discharge to [facility] today. Admissions coordinator said CPAP would be delivered around 3:00 so to go ahead and set up transport. EMS transport has been arranged and she is 9th on the list. RN will call report to [phone] (Room 304). No further concerns. CSW signing off.  Final next level of care: [place] Barriers to Discharge: Barriers Resolved   Patient Goals and CMS Choice Patient states their goals for this hospitalization and ongoing recovery are:: Patient wants to return to [facility] for [email or web address] Compare Post Acute Care list provided to:: Patient Choice offered to / list presented to : Patient  Discharge Placement   Existing PASRR number confirmed : 09/04/21          Patient chooses bed at: [facility] Patient to be transferred to facility by: EMS Name of family member notified: Abimbola Cables Patient and family notified of of transfer: 09/07/21  Discharge Plan and Services   Discharge Planning Services: CM Consult Post Acute Care Choice: [place]          DME Arranged: N/A DME Agency: NA       HH Arranged: NA HH Agency: NA        Social Determinants of Health ([place]) Interventions     Readmission Risk Interventions Readmission Risk Prevention Plan 09/04/2021 08/18/2021  Transportation Screening Complete Complete  PCP or Specialist Appt within 3-5 Days Complete -  HRI or Home Care Consult Complete Complete  Social Work Consult for [place] Planning/Counseling Complete Complete  Palliative Care Screening Not Applicable Not Applicable  Medication Review Press photographer) Complete Complete  Some recent data might  be hidden

## 2021-09-08 NOTE — Progress Notes (Signed)
Pt was discharged to [facility] with her belongings. She was escorted off the unit by EMS on a stretcher. VSS.

## 2021-10-20 ENCOUNTER — Ambulatory Visit: Payer: Medicare Other | Admitting: Neurology

## 2021-10-22 ENCOUNTER — Encounter: Payer: Self-pay | Admitting: Student in an Organized Health Care Education/Training Program

## 2021-10-26 ENCOUNTER — Other Ambulatory Visit: Payer: Self-pay

## 2021-10-26 ENCOUNTER — Encounter: Payer: Self-pay | Admitting: Student in an Organized Health Care Education/Training Program

## 2021-10-26 ENCOUNTER — Ambulatory Visit
Payer: Medicare Other | Attending: Student in an Organized Health Care Education/Training Program | Admitting: Student in an Organized Health Care Education/Training Program

## 2021-10-26 DIAGNOSIS — M542 Cervicalgia: Secondary | ICD-10-CM

## 2021-10-26 DIAGNOSIS — M170 Bilateral primary osteoarthritis of knee: Secondary | ICD-10-CM | POA: Diagnosis not present

## 2021-10-26 DIAGNOSIS — Z96652 Presence of left artificial knee joint: Secondary | ICD-10-CM

## 2021-10-26 DIAGNOSIS — G894 Chronic pain syndrome: Secondary | ICD-10-CM | POA: Diagnosis not present

## 2021-10-26 DIAGNOSIS — M5030 Other cervical disc degeneration, unspecified cervical region: Secondary | ICD-10-CM | POA: Diagnosis not present

## 2021-10-26 DIAGNOSIS — G8929 Other chronic pain: Secondary | ICD-10-CM

## 2021-10-26 DIAGNOSIS — M797 Fibromyalgia: Secondary | ICD-10-CM

## 2021-10-26 DIAGNOSIS — M25562 Pain in left knee: Secondary | ICD-10-CM

## 2021-10-26 DIAGNOSIS — M25551 Pain in right hip: Secondary | ICD-10-CM

## 2021-10-26 MED ORDER — BUPRENORPHINE 7.5 MCG/HR TD PTWK: 1.0000 | MEDICATED_PATCH | TRANSDERMAL | 0 refills | Status: AC

## 2021-10-26 NOTE — Progress Notes (Signed)
Patient: Bethany Villa  Service Category: E/M  Provider: Gillis Santa, MD  DOB: 1955/11/14  DOS: 10/26/2021  Location: Office  MRN: [id number]  Setting: Ambulatory outpatient  Referring Provider: Marinda Elk, MD  Type: Established Patient  Specialty: Interventional Pain Management  PCP: Marinda Elk, MD  Location: Remote location  Delivery: TeleHealth     Virtual Encounter - Pain Management PROVIDER NOTE: Information contained herein reflects review and annotations entered in association with encounter. Interpretation of such information and data should be left to medically-trained personnel. Information provided to patient can be located elsewhere in the medical record under "Patient Instructions". Document created using STT-dictation technology, any transcriptional errors that may result from process are unintentional.    Contact & Pharmacy Preferred: [phone] Home: [phone] (home) Mobile: [phone] (mobile) E-mail: [phone] [email or web address]  [address] [place] [address] Phone: [phone] Fax: [phone]  [id number] [place] [place] [id number] [id number]: [phone]: [phone]  Pre-screening  Bethany Villa "in-person" vs "virtual" encounter. She indicated preferring virtual for this encounter.   Reason COVID-19*   Social distancing based on CDC and AMA recommendations.   I contacted Bethany Bostonon 10/26/2021 via telephone.      I clearly identified myself as BGillis Santa MD. I verified that I was speaking with the correct person using two identifiers (Name: Bethany Villa and date of birth: [birth year].  Consent I sought verbal advanced consent from Bethany Bostonfor virtual visit interactions. I informed Bethany Villa possible security and privacy concerns, risks, and limitations associated with providing  "not-in-person" medical evaluation and management services. I also informed Bethany Villa the availability of "in-person" appointments. Finally, I informed her that there would be a charge for the virtual visit and that she could be  personally, fully or partially, financially responsible for it. Ms. MGreeleyexpressed understanding and agreed to proceed.   Historic Elements   Ms. PJASMIN WINBERRYis a 66y.o. year old, female patient evaluated today after our last contact on 08/20/2021. Bethany Villa has a past medical history of Anxiety, Asthma, Chronic pain, Chronic systolic CHF (congestive heart failure) ([place] (09/22/2019), COPD (chronic obstructive pulmonary disease) ([place], Depression, DJD (degenerative joint disease), Fibromyalgia, GERD (gastroesophageal reflux disease), Headache, Hyperlipidemia, Lower extremity edema, On home oxygen therapy, Panic attack, Shortness of breath dyspnea, and Sleep disorder. She also  has a past surgical history that includes Abdominal surgery; Carpal tunnel release; Ankle surgery; Knee surgery; Cesarean section; Tonsillectomy; Tubal ligation; Back surgery; Total knee arthroplasty (Left, 04/14/2016); and I & D knee with poly exchange (N/A, 06/11/2020). Ms. MAlvarengahas a current medication list which includes the following prescription(s): albuterol, albuterol sulfate, buprenorphine, buspirone, dextromethorphan-guaifenesin, nicotine, sertraline, guaifenesin-dextromethorphan, and nyamyc. She  reports that she has been smoking cigarettes. She has a 45.00 pack-year smoking history. She has never used smokeless tobacco. She reports current alcohol use. She reports that she does not use drugs. Ms. MHelinskiis allergic to gabapentin, mobic [meloxicam], penicillins, and sulfa antibiotics.   HPI  Today, she is being contacted for medication management.  Patient has been in and out of rehab due to altered mental status and was found unresponsive. She was noncompliant with her oxygen.  She did  receive Narcan twice with minimal response suggesting that it was not related to opioid therapy. She does have a displaced acute fracture of her  sixth rib secondary to a fall.  Lidocaine patch was placed on this. Patient states that she has been very anxious and was restarted on her mental health/psychiatric medications last week and seems to be doing better. She is complaining of increased pain in her lower back.  She states that the hospital they were giving her oxycodone which was helpful.  She is wondering what the next steps for pain management are.  I was very clear with the patient and informed her that we could retrial buprenorphine and that she would only be a candidate for buprenorphine at this pain clinic given her comorbidities and the pharmacokinetic profile of Butrans which is safer for her than any controlled to her controlled 3 long-acting oral analgesic.  Also the benefit with buprenorphine that it is a transdermal patch.  We will reduce her dose back down to buprenorphine 7.5 mcg an hour and see how she does with that.  She can follow-up in 4 weeks to assess response.  Would not escalate dose beyond 10 mcg an hour.  She states that she has stopped smoking which I commended her on.  Pharmacotherapy Assessment   Opioid Analgesic: Butrans patch @ 7.47mg/hr   Monitoring: [place] PMP: PDMP reviewed during this encounter.       Pharmacotherapy: No side-effects or adverse reactions reported. Compliance: No problems identified. Effectiveness: Clinically acceptable. Plan: Refer to "POC". UDS:  Summary  Date Value Ref Range Status  05/16/2018 FINAL  Final    Comment:    ==================================================================== TOXASSURE COMP DRUG ANALYSIS,UR ==================================================================== Test                             Result       Flag       Units Drug Present and Declared for Prescription Verification   Sertraline                      PRESENT      EXPECTED   Desmethylsertraline            PRESENT      EXPECTED    Desmethylsertraline is an expected metabolite of sertraline.   Trazodone                      PRESENT      EXPECTED   1,3 chlorophenyl piperazine    PRESENT      EXPECTED    1,3-chlorophenyl piperazine is an expected metabolite of    trazodone. Drug Present not Declared for Prescription Verification   Buprenorphine                  11           UNEXPECTED ng/mg creat   Norbuprenorphine               45           UNEXPECTED ng/mg creat    Source of buprenorphine is a scheduled prescription medication.    Norbuprenorphine is an expected metabolite of buprenorphine.   Methocarbamol                  PRESENT      UNEXPECTED Drug Absent but Declared for Prescription Verification   Tizanidine                     Not Detected UNEXPECTED    Tizanidine, as indicated in the declared  medication list, is not    always detected even when used as directed. ==================================================================== Test                      Result    Flag   Units      Ref Range   Creatinine              126              mg/dL      >=20 ==================================================================== Declared Medications:  The flagging and interpretation on this report are based on the  following declared medications.  Unexpected results may arise from  inaccuracies in the declared medications.  **Note: The testing scope of this panel includes these medications:  Sertraline (Zoloft)  Trazodone  **Note: The testing scope of this panel does not include small to  moderate amounts of these reported medications:  Tizanidine  **Note: The testing scope of this panel does not include following  reported medications:  Albuterol (Proventil)  Atorvastatin (Lipitor)  Buspirone (BuSpar) ==================================================================== For clinical consultation, please call [phone]. ====================================================================      Laboratory Chemistry Profile   Renal Lab Results  Component Value Date   BUN 16 09/04/2021   CREATININE 0.50 09/04/2021   BCR 16 08/01/2015   GFRAA >60 10/11/2019   GFRNONAA >60 09/04/2021    Hepatic Lab Results  Component Value Date   AST 21 09/03/2021   ALT 15 09/03/2021   ALBUMIN 3.0 (L) 09/03/2021   ALKPHOS 102 09/03/2021   AMYLASE 39 10/01/2017   LIPASE 18 10/01/2017   AMMONIA 33 08/15/2021    Electrolytes Lab Results  Component Value Date   NA 135 09/04/2021   K 3.9 09/04/2021   CL 90 (L) 09/04/2021   CALCIUM 8.6 (L) 09/04/2021   MG 2.4 09/03/2021   PHOS 4.8 (H) 08/11/2021    Bone No results found for: VD25OH, ZD664QI3KVQ, QV9563OV5, [id number], 25OHVITD1, 25OHVITD2, 25OHVITD3, TESTOFREE, TESTOSTERONE  Inflammation (CRP: Acute Phase) (ESR: Chronic Phase) Lab Results  Component Value Date   CRP 8.6 (H) 08/15/2021   ESRSEDRATE 69 (H) 06/17/2020   LATICACIDVEN 1.2 08/15/2021         Note: Above Lab results reviewed.  Imaging  CT Angio Chest Pulmonary Embolism (PE) W or WO Contrast CLINICAL DATA:  Respiratory failure, respiratory distress with oxygen saturation in the 60s on room air  EXAM: CT ANGIOGRAPHY CHEST WITH CONTRAST  TECHNIQUE: Multidetector CT imaging of the chest was performed using the standard protocol during bolus administration of intravenous contrast. Multiplanar CT image reconstructions and MIPs were obtained to evaluate the vascular anatomy.  CONTRAST:  3m OMNIPAQUE IOHEXOL 350 MG/ML SOLN IV  COMPARISON:  CT chest 09/04/2015  FINDINGS: Cardiovascular: Atherosclerotic calcifications aorta and coronary arteries. Aorta normal caliber without aneurysm or dissection. Heart unremarkable. No pericardial effusion. Pulmonary arteries adequately opacified and patent. No evidence of pulmonary embolism.  Mediastinum/Nodes: Base of cervical region normal  appearance. No thoracic adenopathy. Esophagus unremarkable.  Lungs/Pleura: Emphysematous changes. Central peribronchial thickening. Scattered interstitial prominence. No acute infiltrate, pleural effusion, or pneumothorax.  Upper Abdomen: Nodular appearing liver suggestive of cirrhosis. Spleen appears enlarged, 14.8 cm in greatest axial dimension, craniocaudal length not assessed. Small hypervascular focus lateral spleen 9 mm diameter, nonspecific but unchanged. Small splenule adjacent to spleen.  Musculoskeletal: No acute osseous findings. Multiple healing LEFT rib fractures.  Review of the MIP images confirms the above findings.  IMPRESSION: No evidence of pulmonary embolism.  Nodular  hepatic contours question cirrhosis.  Probable splenomegaly.  Aortic Atherosclerosis (ICD10-I70.0) and Emphysema (ICD10-J43.9).  Electronically Signed   By: Lavonia Dana M.D.   On: 09/04/2021 10:07  Assessment  The primary encounter diagnosis was Fibromyalgia. 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  Plan of Care    Bethany Villa has a current medication list which includes the following long-term medication(s): albuterol and albuterol sulfate.  Pharmacotherapy (Medications Ordered): Meds ordered this encounter  Medications   buprenorphine (BUTRANS) 7.5 MCG/HR    Sig: Place 1 patch onto the skin once a week for 28 days.    Dispense:  4 patch    Refill:  0    Chronic Pain: STOP Act (Not applicable) Fill 1 day early if closed on refill date. Avoid benzodiazepines within 8 hours of opioids   Orders:  No orders of the defined types were placed in this encounter.  Follow-up plan:   Return in about 4 weeks (around 11/23/2021) for Medication Management,  virtual.        Recent Visits No visits were found meeting these conditions. Showing recent visits within past 90 days and meeting all other requirements Today's Visits Date Type Provider Dept  10/26/21 Office Visit Gillis Santa, MD [facility]  Showing today's visits and meeting all other requirements Future Appointments No visits were found meeting these conditions. Showing future appointments within next 90 days and meeting all other requirements  I discussed the assessment and treatment plan with the patient. The patient was provided an opportunity to ask questions and all were answered. The patient agreed with the plan and demonstrated an understanding of the instructions.  Patient advised to call back or seek an in-person evaluation if the symptoms or condition worsens.  Duration of encounter: 71mnutes.  Note by: BGillis Santa MD Date: 10/26/2021; Time: 3:13 PM

## 2021-11-06 ENCOUNTER — Telehealth: Payer: Self-pay | Admitting: Student in an Organized Health Care Education/Training Program

## 2021-11-06 NOTE — Telephone Encounter (Signed)
Spoke with patient and  she was adament that Dr Holley Raring told her he would increase her patches to 10 mcg.  I informed patient that she had an appointment on 11-19-2021  and she could discuss this with him then.  She insisted that I send him a message prior to her appointment.  Message sent to Dr Holley Raring.  ?

## 2021-11-19 ENCOUNTER — Telehealth: Payer: Self-pay | Admitting: Student in an Organized Health Care Education/Training Program

## 2021-11-19 ENCOUNTER — Ambulatory Visit
Payer: Medicare Other | Attending: Student in an Organized Health Care Education/Training Program | Admitting: Student in an Organized Health Care Education/Training Program

## 2021-11-19 ENCOUNTER — Other Ambulatory Visit: Payer: Self-pay

## 2021-11-19 ENCOUNTER — Other Ambulatory Visit: Payer: Self-pay | Admitting: *Deleted

## 2021-11-19 DIAGNOSIS — M797 Fibromyalgia: Secondary | ICD-10-CM

## 2021-11-19 NOTE — Telephone Encounter (Signed)
Patient wants to know if she can get methacarbomal sent in. ?

## 2021-11-19 NOTE — Progress Notes (Signed)
I attempted to call the patient however no response. Voicemail left instructing patient to call front desk office at [phone] to reschedule appointment. -Dr Omolola Mittman  

## 2021-11-23 ENCOUNTER — Telehealth: Payer: Self-pay | Admitting: Student in an Organized Health Care Education/Training Program

## 2021-11-23 ENCOUNTER — Telehealth: Payer: Self-pay | Admitting: *Deleted

## 2021-11-23 MED ORDER — METHOCARBAMOL 500 MG PO TABS: 500.0000 mg | ORAL_TABLET | Freq: Three times a day (TID) | ORAL | 2 refills | Status: AC | PRN

## 2021-11-23 NOTE — Telephone Encounter (Signed)
Patient notified per voicemail that script has been sent to pharmacy. 

## 2021-11-23 NOTE — Telephone Encounter (Signed)
Orders from 10-26-21 appt are to follow-up to evaluate effectiveness of Buprenorphine patches. Attempted to call patient to instruct her to make appt. Message left. ?

## 2021-11-23 NOTE — Telephone Encounter (Signed)
Patient called again, asking to have Buprenorphine patches called in. I explained to her that she must have an in-person appt , no prescriptions for opioids may be done outside of appt. Transferred the call to secretaries to do this. Carollee Herter then called nurse's station because patient is asking her to have the Rx called in. Call then transferred back to nurse's station. I explained to her again that she must come in for appt. She became angry, stating she would call back. ?

## 2021-11-23 NOTE — Addendum Note (Signed)
Addended by: Edward Jolly on: 11/23/2021 09:03 AM ? ? Modules accepted: Orders ? ?

## 2022-01-28 DEATH — deceased
